# Patient Record
Sex: Female | Born: 1980 | Race: White | Hispanic: No | Marital: Married | Smoking: Former smoker
Health system: Southern US, Community
[De-identification: ages and names within clinical notes are randomized; demographics above are authoritative.]

## PROBLEM LIST (undated history)

## (undated) ENCOUNTER — Inpatient Hospital Stay (HOSPITAL_COMMUNITY): Payer: Self-pay

## (undated) DIAGNOSIS — Z5189 Encounter for other specified aftercare: Secondary | ICD-10-CM

## (undated) DIAGNOSIS — N760 Acute vaginitis: Secondary | ICD-10-CM

## (undated) DIAGNOSIS — R519 Headache, unspecified: Secondary | ICD-10-CM

## (undated) DIAGNOSIS — F419 Anxiety disorder, unspecified: Secondary | ICD-10-CM

## (undated) DIAGNOSIS — B9689 Other specified bacterial agents as the cause of diseases classified elsewhere: Secondary | ICD-10-CM

## (undated) DIAGNOSIS — G709 Myoneural disorder, unspecified: Secondary | ICD-10-CM

## (undated) DIAGNOSIS — C189 Malignant neoplasm of colon, unspecified: Secondary | ICD-10-CM

## (undated) DIAGNOSIS — Z933 Colostomy status: Secondary | ICD-10-CM

## (undated) DIAGNOSIS — Z9221 Personal history of antineoplastic chemotherapy: Secondary | ICD-10-CM

## (undated) DIAGNOSIS — C801 Malignant (primary) neoplasm, unspecified: Secondary | ICD-10-CM

## (undated) DIAGNOSIS — K56609 Unspecified intestinal obstruction, unspecified as to partial versus complete obstruction: Secondary | ICD-10-CM

## (undated) DIAGNOSIS — Z8052 Family history of malignant neoplasm of bladder: Secondary | ICD-10-CM

## (undated) DIAGNOSIS — K635 Polyp of colon: Secondary | ICD-10-CM

## (undated) DIAGNOSIS — N390 Urinary tract infection, site not specified: Secondary | ICD-10-CM

## (undated) HISTORY — PX: COLONOSCOPY: SHX174

## (undated) HISTORY — DX: Encounter for other specified aftercare: Z51.89

## (undated) HISTORY — DX: Malignant neoplasm of colon, unspecified: C18.9

## (undated) HISTORY — DX: Family history of malignant neoplasm of bladder: Z80.52

## (undated) HISTORY — DX: Unspecified intestinal obstruction, unspecified as to partial versus complete obstruction: K56.609

## (undated) HISTORY — DX: Anxiety disorder, unspecified: F41.9

## (undated) HISTORY — DX: Polyp of colon: K63.5

## (undated) HISTORY — DX: Myoneural disorder, unspecified: G70.9

## (undated) HISTORY — DX: Personal history of antineoplastic chemotherapy: Z92.21

## (undated) HISTORY — DX: Colostomy status: Z93.3

---

## 1999-01-14 ENCOUNTER — Emergency Department (HOSPITAL_COMMUNITY): Admission: EM | Admit: 1999-01-14 | Discharge: 1999-01-14 | Payer: Self-pay | Admitting: Emergency Medicine

## 1999-01-17 ENCOUNTER — Inpatient Hospital Stay (HOSPITAL_COMMUNITY): Admission: EM | Admit: 1999-01-17 | Discharge: 1999-01-19 | Payer: Self-pay | Admitting: *Deleted

## 2000-01-07 ENCOUNTER — Emergency Department (HOSPITAL_COMMUNITY): Admission: EM | Admit: 2000-01-07 | Discharge: 2000-01-07 | Payer: Self-pay | Admitting: Emergency Medicine

## 2000-01-09 ENCOUNTER — Emergency Department (HOSPITAL_COMMUNITY): Admission: EM | Admit: 2000-01-09 | Discharge: 2000-01-09 | Payer: Self-pay | Admitting: Emergency Medicine

## 2000-05-13 ENCOUNTER — Emergency Department (HOSPITAL_COMMUNITY): Admission: EM | Admit: 2000-05-13 | Discharge: 2000-05-13 | Payer: Self-pay | Admitting: Emergency Medicine

## 2000-12-14 ENCOUNTER — Inpatient Hospital Stay (HOSPITAL_COMMUNITY): Admission: AD | Admit: 2000-12-14 | Discharge: 2000-12-14 | Payer: Self-pay | Admitting: *Deleted

## 2000-12-19 ENCOUNTER — Emergency Department (HOSPITAL_COMMUNITY): Admission: EM | Admit: 2000-12-19 | Discharge: 2000-12-19 | Payer: Self-pay | Admitting: Emergency Medicine

## 2001-01-26 ENCOUNTER — Emergency Department (HOSPITAL_COMMUNITY): Admission: EM | Admit: 2001-01-26 | Discharge: 2001-01-26 | Payer: Self-pay | Admitting: Emergency Medicine

## 2001-02-06 ENCOUNTER — Emergency Department (HOSPITAL_COMMUNITY): Admission: EM | Admit: 2001-02-06 | Discharge: 2001-02-07 | Payer: Self-pay | Admitting: Emergency Medicine

## 2001-07-22 ENCOUNTER — Emergency Department (HOSPITAL_COMMUNITY): Admission: EM | Admit: 2001-07-22 | Discharge: 2001-07-22 | Payer: Self-pay | Admitting: Emergency Medicine

## 2001-10-04 ENCOUNTER — Emergency Department (HOSPITAL_COMMUNITY): Admission: EM | Admit: 2001-10-04 | Discharge: 2001-10-04 | Payer: Self-pay | Admitting: Emergency Medicine

## 2002-02-07 ENCOUNTER — Emergency Department (HOSPITAL_COMMUNITY): Admission: EM | Admit: 2002-02-07 | Discharge: 2002-02-07 | Payer: Self-pay | Admitting: Emergency Medicine

## 2002-03-14 ENCOUNTER — Inpatient Hospital Stay (HOSPITAL_COMMUNITY): Admission: EM | Admit: 2002-03-14 | Discharge: 2002-03-17 | Payer: Self-pay | Admitting: Psychiatry

## 2004-01-21 ENCOUNTER — Emergency Department (HOSPITAL_COMMUNITY): Admission: EM | Admit: 2004-01-21 | Discharge: 2004-01-21 | Payer: Self-pay | Admitting: Emergency Medicine

## 2004-07-23 ENCOUNTER — Emergency Department (HOSPITAL_COMMUNITY): Admission: EM | Admit: 2004-07-23 | Discharge: 2004-07-24 | Payer: Self-pay | Admitting: *Deleted

## 2005-02-18 ENCOUNTER — Emergency Department (HOSPITAL_COMMUNITY): Admission: EM | Admit: 2005-02-18 | Discharge: 2005-02-18 | Payer: Self-pay | Admitting: Emergency Medicine

## 2005-05-28 ENCOUNTER — Emergency Department (HOSPITAL_COMMUNITY): Admission: EM | Admit: 2005-05-28 | Discharge: 2005-05-28 | Payer: Self-pay | Admitting: Emergency Medicine

## 2005-06-09 ENCOUNTER — Emergency Department (HOSPITAL_COMMUNITY): Admission: EM | Admit: 2005-06-09 | Discharge: 2005-06-10 | Payer: Self-pay | Admitting: Emergency Medicine

## 2006-04-06 ENCOUNTER — Emergency Department (HOSPITAL_COMMUNITY): Admission: EM | Admit: 2006-04-06 | Discharge: 2006-04-06 | Payer: Self-pay | Admitting: *Deleted

## 2006-05-04 ENCOUNTER — Emergency Department (HOSPITAL_COMMUNITY): Admission: EM | Admit: 2006-05-04 | Discharge: 2006-05-04 | Payer: Self-pay | Admitting: Emergency Medicine
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
## 2006-07-27 ENCOUNTER — Emergency Department (HOSPITAL_COMMUNITY): Admission: EM | Admit: 2006-07-27 | Discharge: 2006-07-27 | Payer: Self-pay | Admitting: Emergency Medicine

## 2006-12-15 ENCOUNTER — Emergency Department (HOSPITAL_COMMUNITY): Admission: EM | Admit: 2006-12-15 | Discharge: 2006-12-15 | Payer: Self-pay | Admitting: Emergency Medicine

## 2007-06-28 ENCOUNTER — Emergency Department (HOSPITAL_COMMUNITY): Admission: EM | Admit: 2007-06-28 | Discharge: 2007-06-28 | Payer: Self-pay | Admitting: Emergency Medicine

## 2007-08-13 ENCOUNTER — Emergency Department (HOSPITAL_COMMUNITY): Admission: EM | Admit: 2007-08-13 | Discharge: 2007-08-13 | Payer: Self-pay | Admitting: Emergency Medicine

## 2007-10-04 ENCOUNTER — Emergency Department (HOSPITAL_COMMUNITY): Admission: EM | Admit: 2007-10-04 | Discharge: 2007-10-04 | Payer: Self-pay | Admitting: Emergency Medicine
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
## 2008-03-17 ENCOUNTER — Emergency Department (HOSPITAL_COMMUNITY): Admission: EM | Admit: 2008-03-17 | Discharge: 2008-03-18 | Payer: Self-pay | Admitting: Emergency Medicine

## 2008-05-09 ENCOUNTER — Emergency Department (HOSPITAL_COMMUNITY): Admission: EM | Admit: 2008-05-09 | Discharge: 2008-05-09 | Payer: Self-pay | Admitting: Internal Medicine

## 2008-05-09 IMAGING — US US OB LIMITED
1 series · 14 of 28 positions shown · non-contrast
Comparison: none

CLINICAL DATA: Abdominal pain and cramping

[Series 1: unknown · 14 of 42 slices shown]
[im 2/42]
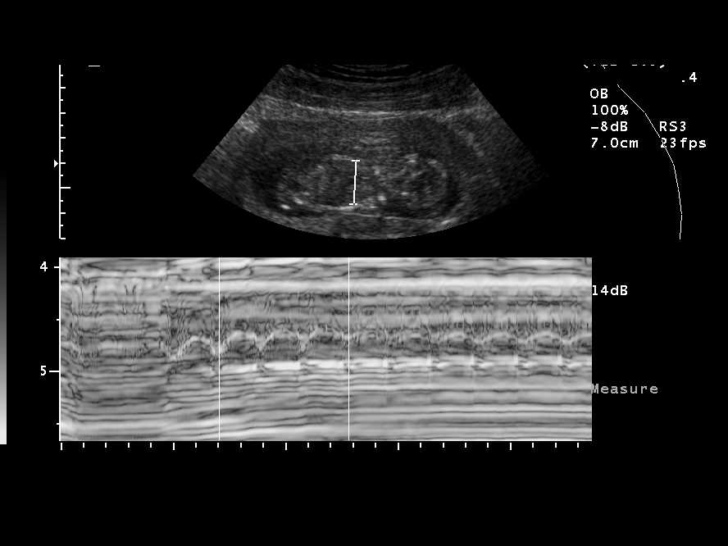
[im 5/42]
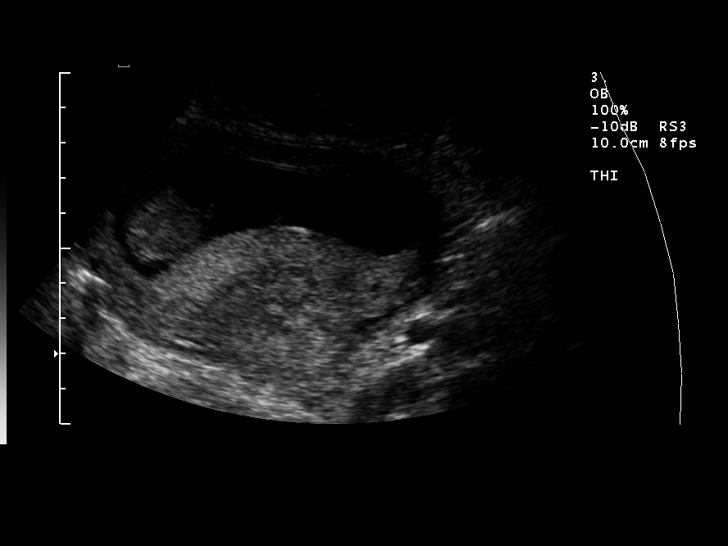
[im 8/42]
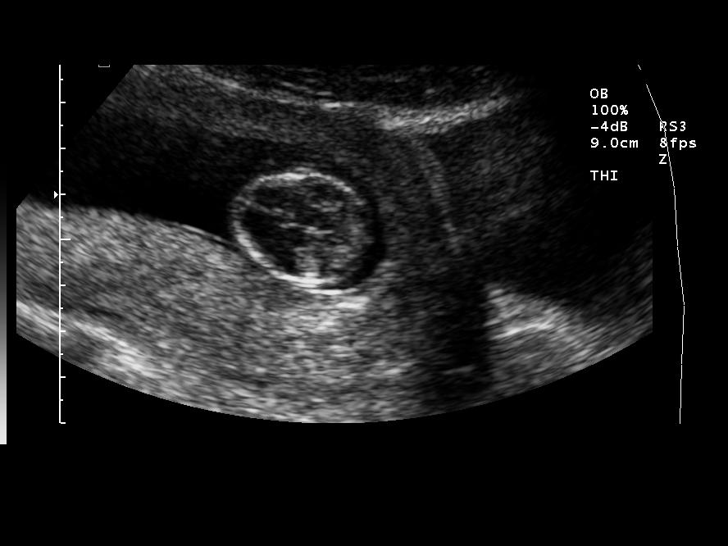
[im 11/42]
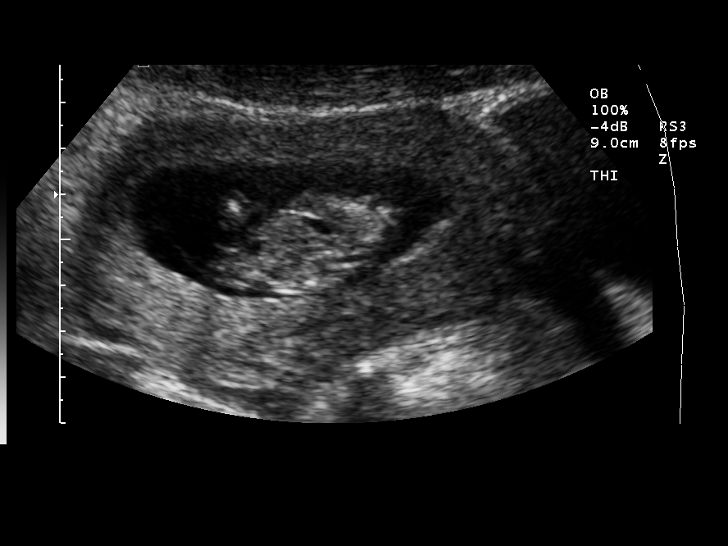
[im 14/42]
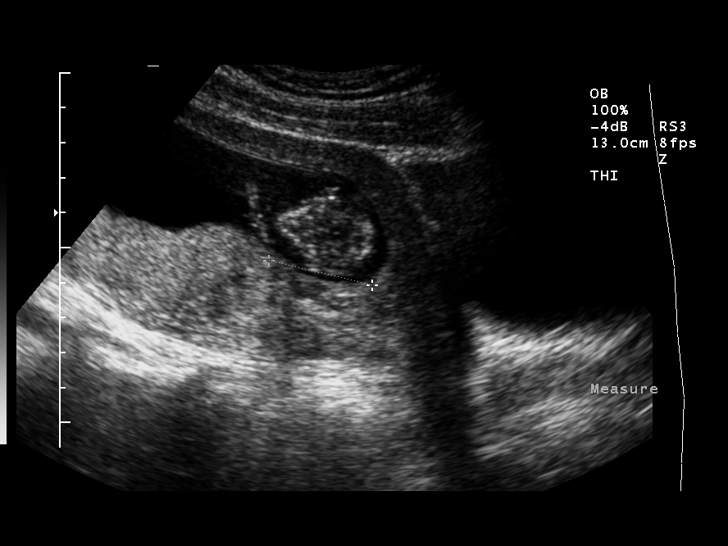
[im 17/42]
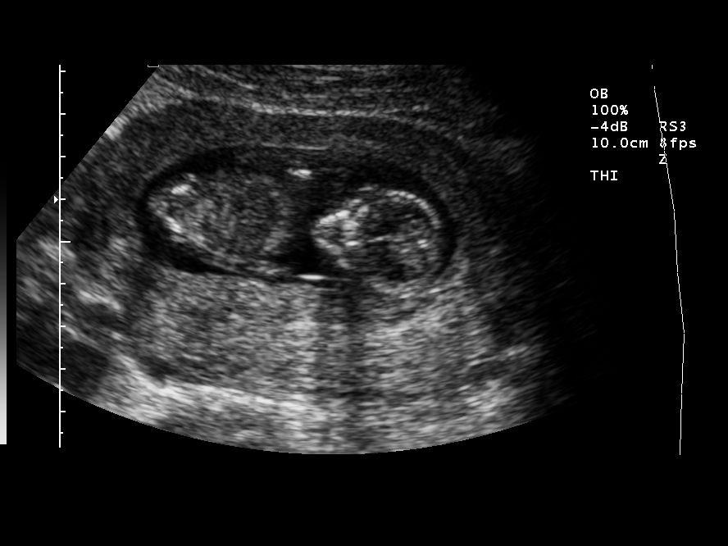
[im 20/42]
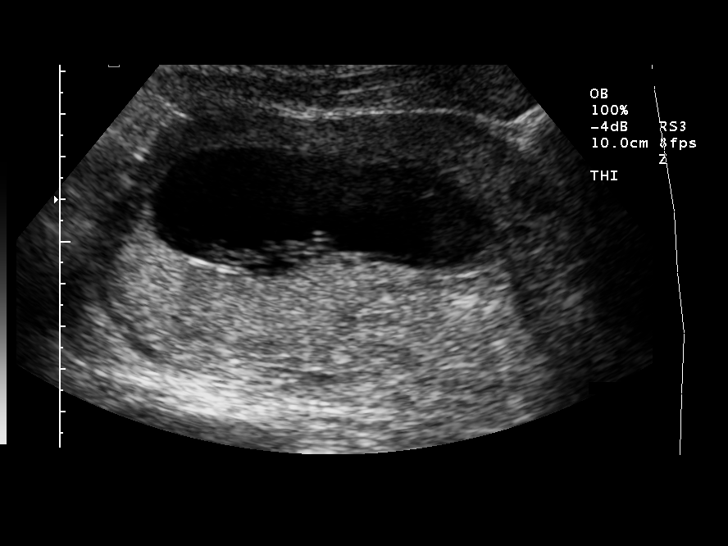
[im 23/42]
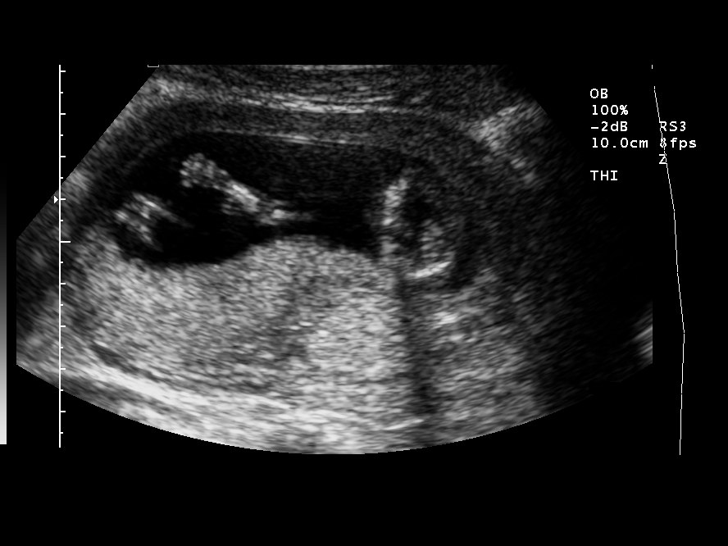
[im 26/42]
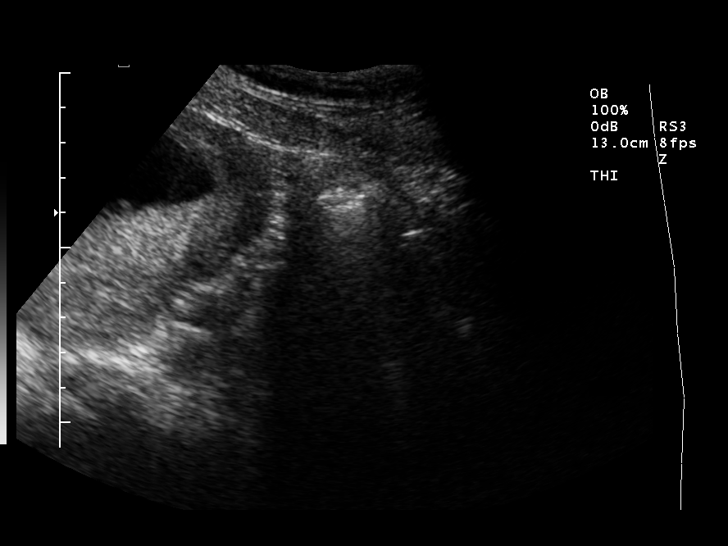
[im 29/42]
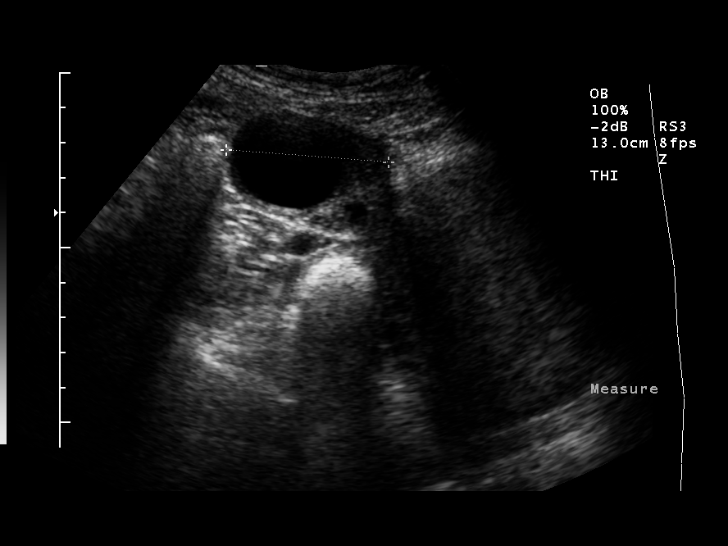
[im 32/42]
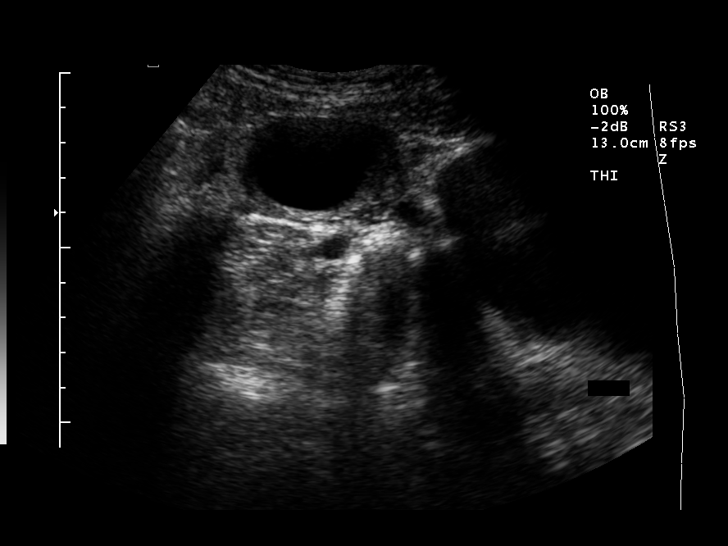
[im 35/42]
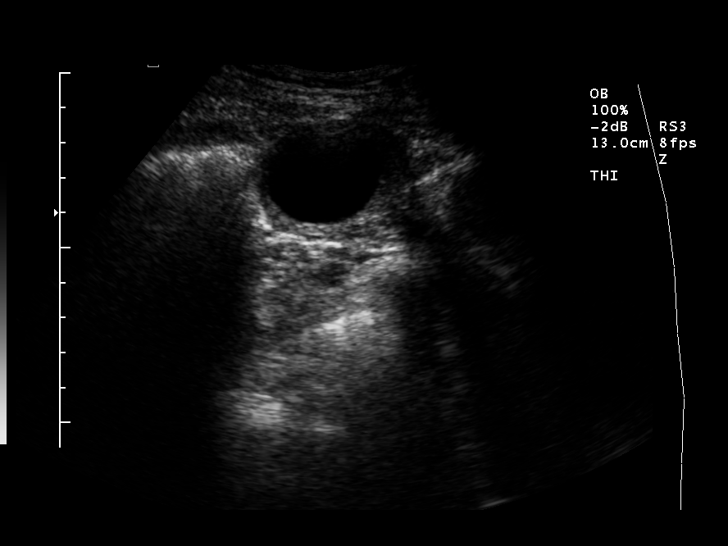
[im 38/42]
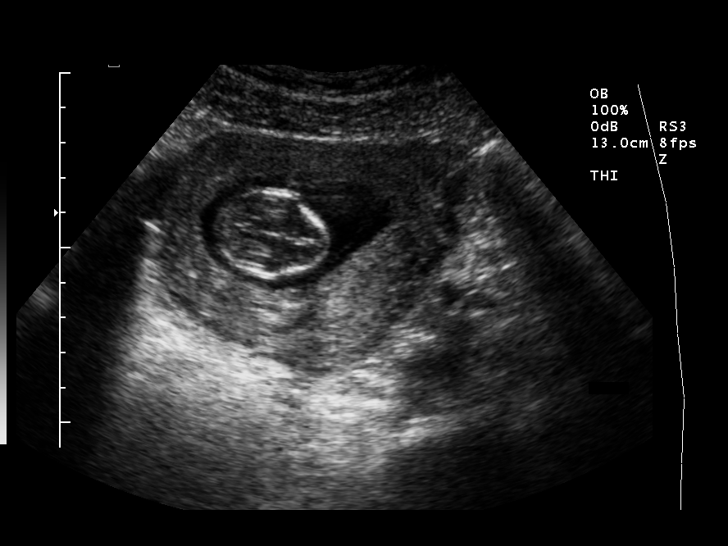
[im 42/42]
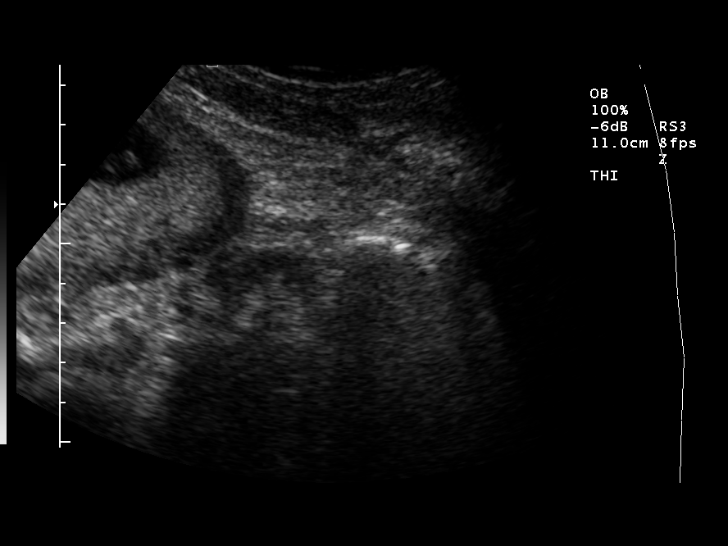

[14 of 28 positions shown; findings below may reference images not displayed]

LIMITED OBSTETRIC ULTRASOUND

Number of Fetuses: 1
Heart Rate: [M6]
Movement: yes
Breathing: yes
Presentation: Cephalic

Placental Location: Posterior
Grade: Not assessed
Previa: No
Amniotic Fluid (Subjective): Normal
Amniotic Fluid (Objective):  Vertical Pocket not assessed cm
                          AFI not assessed cm  (5 %ile = cm; 95
%ile = cm for wks)

The following fetal anatomy was visualized on today's exam: Not
evaluated on this limited study.

Gestational age is estimated at 13 weeks 6 days by BPD

MATERNAL FINDINGS:
Cervix: Closed

3.5 cm right ovarian cyst noted.
IMPRESSION: 1.  Single intrauterine gestation estimated at 13-week 6 days.
2.  3.5 cm right ovarian cyst noted.
3.  No other acute or significant findings.

## 2008-06-26 ENCOUNTER — Inpatient Hospital Stay (HOSPITAL_COMMUNITY): Admission: AD | Admit: 2008-06-26 | Discharge: 2008-06-26 | Payer: Self-pay | Admitting: Obstetrics and Gynecology

## 2008-10-03 ENCOUNTER — Inpatient Hospital Stay (HOSPITAL_COMMUNITY): Admission: AD | Admit: 2008-10-03 | Discharge: 2008-10-03 | Payer: Self-pay | Admitting: *Deleted

## 2008-10-04 ENCOUNTER — Inpatient Hospital Stay (HOSPITAL_COMMUNITY): Admission: AD | Admit: 2008-10-04 | Discharge: 2008-10-04 | Payer: Self-pay | Admitting: Obstetrics and Gynecology

## 2008-10-05 ENCOUNTER — Inpatient Hospital Stay (HOSPITAL_COMMUNITY): Admission: AD | Admit: 2008-10-05 | Discharge: 2008-10-06 | Payer: Self-pay | Admitting: Obstetrics and Gynecology

## 2008-10-28 ENCOUNTER — Inpatient Hospital Stay (HOSPITAL_COMMUNITY): Admission: AD | Admit: 2008-10-28 | Discharge: 2008-10-28 | Payer: Self-pay | Admitting: Obstetrics and Gynecology

## 2008-10-29 ENCOUNTER — Inpatient Hospital Stay (HOSPITAL_COMMUNITY): Admission: AD | Admit: 2008-10-29 | Discharge: 2008-10-29 | Payer: Self-pay | Admitting: Obstetrics

## 2008-11-11 ENCOUNTER — Inpatient Hospital Stay (HOSPITAL_COMMUNITY): Admission: AD | Admit: 2008-11-11 | Discharge: 2008-11-15 | Payer: Self-pay | Admitting: Obstetrics

## 2008-11-12 ENCOUNTER — Encounter (INDEPENDENT_AMBULATORY_CARE_PROVIDER_SITE_OTHER): Payer: Self-pay | Admitting: Obstetrics

## 2008-12-26 ENCOUNTER — Emergency Department (HOSPITAL_COMMUNITY): Admission: EM | Admit: 2008-12-26 | Discharge: 2008-12-27 | Payer: Self-pay | Admitting: Emergency Medicine
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
## 2010-02-27 DIAGNOSIS — F988 Other specified behavioral and emotional disorders with onset usually occurring in childhood and adolescence: Secondary | ICD-10-CM | POA: Insufficient documentation

## 2010-02-27 DIAGNOSIS — F411 Generalized anxiety disorder: Secondary | ICD-10-CM | POA: Insufficient documentation

## 2010-05-10 ENCOUNTER — Emergency Department (HOSPITAL_COMMUNITY): Admission: EM | Admit: 2010-05-10 | Discharge: 2010-05-11 | Payer: Self-pay | Admitting: Emergency Medicine
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
## 2010-11-16 ENCOUNTER — Emergency Department (HOSPITAL_COMMUNITY)
Admission: EM | Admit: 2010-11-16 | Discharge: 2010-11-16 | Disposition: A | Discharge status: Home / Self Care | Payer: Self-pay | Attending: Emergency Medicine | Admitting: Emergency Medicine

## 2010-11-16 DIAGNOSIS — F988 Other specified behavioral and emotional disorders with onset usually occurring in childhood and adolescence: Secondary | ICD-10-CM | POA: Insufficient documentation

## 2010-11-16 DIAGNOSIS — N760 Acute vaginitis: Secondary | ICD-10-CM | POA: Insufficient documentation

## 2010-11-16 DIAGNOSIS — R109 Unspecified abdominal pain: Secondary | ICD-10-CM | POA: Insufficient documentation

## 2010-11-16 LAB — URINALYSIS, ROUTINE W REFLEX MICROSCOPIC
Hgb urine dipstick: NEGATIVE
Ketones, ur: 15 mg/dL — AB
Nitrite: NEGATIVE
Protein, ur: NEGATIVE mg/dL
Urine Glucose, Fasting: NEGATIVE mg/dL

## 2010-11-16 LAB — WET PREP, GENITAL
Trich, Wet Prep: NONE SEEN
Yeast Wet Prep HPF POC: NONE SEEN

## 2010-11-18 LAB — GC/CHLAMYDIA PROBE AMP, GENITAL: GC Probe Amp, Genital: NEGATIVE

## 2011-01-14 LAB — DIFFERENTIAL
Eosinophils Absolute: 0.1 10*3/uL (ref 0.0–0.7)
Eosinophils Relative: 1 % (ref 0–5)
Lymphs Abs: 0.9 10*3/uL (ref 0.7–4.0)
Monocytes Absolute: 0.6 10*3/uL (ref 0.1–1.0)
Monocytes Relative: 6 % (ref 3–12)
Neutro Abs: 8.8 10*3/uL — ABNORMAL HIGH (ref 1.7–7.7)

## 2011-01-14 LAB — CBC
HCT: 31.6 % — ABNORMAL LOW (ref 36.0–46.0)
HCT: 38 % (ref 36.0–46.0)
HCT: 29.3 % — ABNORMAL LOW (ref 36.0–46.0)
Hemoglobin: 10.7 g/dL — ABNORMAL LOW (ref 12.0–15.0)
Hemoglobin: 12.9 g/dL (ref 12.0–15.0)
Hemoglobin: 10.1 g/dL — ABNORMAL LOW (ref 12.0–15.0)
MCHC: 33.7 g/dL (ref 30.0–36.0)
MCHC: 34 g/dL (ref 30.0–36.0)
MCHC: 34.6 g/dL (ref 30.0–36.0)
MCV: 91.1 fL (ref 78.0–100.0)
MCV: 91.1 fL (ref 78.0–100.0)
RBC: 4.17 MIL/uL (ref 3.87–5.11)
RBC: 3.22 MIL/uL — ABNORMAL LOW (ref 3.87–5.11)
RDW: 14.1 % (ref 11.5–15.5)
RDW: 14.2 % (ref 11.5–15.5)
WBC: 9.3 10*3/uL (ref 4.0–10.5)

## 2011-01-14 LAB — COMPREHENSIVE METABOLIC PANEL
Albumin: 1.9 g/dL — ABNORMAL LOW (ref 3.5–5.2)
CO2: 25 mEq/L (ref 19–32)
Calcium: 8 mg/dL — ABNORMAL LOW (ref 8.4–10.5)
GFR calc non Af Amer: >60 mL/min (ref >60)
Potassium: 3.7 mEq/L (ref 3.5–5.1)
Total Protein: 4.4 g/dL — ABNORMAL LOW (ref 6.0–8.3)

## 2011-01-14 LAB — LACTATE DEHYDROGENASE: LDH: 182 U/L (ref 94–250)

## 2011-01-14 LAB — PLATELET COUNT: Platelets: 115 10*3/uL — ABNORMAL LOW (ref 150–400)

## 2011-02-11 NOTE — Op Note (Signed)
Stephanie [name], Stephanie [name]           ACCOUNT NO.:  [id number]   MEDICAL RECORD NO.:  [id number]          PATIENT TYPE:  INP   LOCATION:  9146                          FACILITY:  [facility]   PHYSICIAN:  Maxie Better, M.D.DATE OF BIRTH:  06/26/1981   DATE OF PROCEDURE:  11/12/2008  DATE OF DISCHARGE:                               OPERATIVE REPORT   PREOPERATIVE DIAGNOSES:  1. Arrest of descent.  2. Presumed chorioamnionitis.  3. Post term.   PROCEDURE:  Primary cesarean section, Kerr hysterotomy.   POSTOPERATIVE DIAGNOSES:  1. Arrest of descent.  2. Presumed chorioamnionitis.  3. Post term.   ANESTHESIA:  Epidural.   SURGEON:  Maxie Better, MD   ASSISTANT:  Marlinda Mike, CNM   INDICATIONS:  A 30 year old gravida 1, para 0 female postdate admitted  on November 11, 2008, for induction of labor.  She underwent Cytotec  placement x2 with subsequent use of low-dose Pitocin.  Artifical rupture  of membranes was performed.  Clear fluid noted.  The patient progressed  to full dilatation.  She developed a temperature during her labor of  100.5 axillary.  At that time, the patient was fully dilated.  She was  given Unasyn, Tylenol, and started to push.  The patient pushed for 3  hours without further descent beyond +1 station and requested a cesarean  section.  Consent was signed, procedure explained.  Surgical risks were  reviewed.  She was transferred to the operating room.   PROCEDURE:  Under adequate epidural anesthesia, the patient was placed  in a supine position with a left lateral tilt.  An indwelling Foley  catheter was already in place.  The patient was sterilely prepped and  draped in usual fashion.  Marcaine 0.25% was injected along the planned  Pfannenstiel skin incision site.  Pfannenstiel skin incision was then  made, carried down to the rectus fascia.  Rectus fascia was opened  transversely.  The rectus fascia was then bluntly and sharply dissected  off the  rectus muscle in superior and inferior fashion.  Rectus muscles  split in midline.  The parietal peritoneum was entered sharply and  extended.  The vesicouterine peritoneum was opened transversely.  The  bladder was then bluntly dissected off the lower uterine segment,  displaced inferiorly.  A curvilinear low-transverse uterine incision was  then made and extended with bandage scissors.  Initially attempted to  delivery of left occiput posterior presentation, which was unsuccessful.  The vertex was wedged in the pelvis.  Staff was utilized to ultimately  push the head from the vaginal root with subsequent delivery of large  female who was bulb suctioned.  The abdominal cord was clamped and cut.  The baby was transferred to the awaiting pediatrician who assigned  Apgars of 8 and 9 at 1 and 5 minutes.  The placenta was manually  removed.  Uterine cavity was cleaned of debris.  The uterine incision  had no extension.  It was closed in 2 layers; the first layer with 0  Monocryl running locked stitch and second layer imbricated using 0  Monocryl suture.  Normal tubes and ovaries were noted  bilaterally.  Some  very tiny subserosal fibroids noted.  Abdomen was copiously irrigated,  suctioned debris.  Good hemostasis was noted along the incision line.  The parietal peritoneum was then closed with 2-0 Vicryl.  The rectus  fascia was closed with 0 Vicryl x2.  The subcutaneous area was  irrigated, small bleeders cauterized, interrupted 2-0 plain sutures  placed, and skin approximated using Ethicon staples.   SPECIMENS:  Placenta sent to Pathology for maternal fever.   ESTIMATED BLOOD LOSS:  800 mL.   URINE OUTPUT:  650 mL.   INTRAOPERATIVE FLUID:  600 mL.   COUNTS:  Sponge and instrument counts x2 correct.   COMPLICATIONS:  None.   Weight of the baby was 8 pounds 9 ounces.  The patient tolerated the  procedure well and was transferred to recovery in stable condition.      Maxie Better, M.D.  Electronically Signed     /MEDQ  D:  11/12/2008  T:  11/13/2008  Job:  [id number]

## 2011-02-11 NOTE — H&P (Signed)
NAMELAVETTA, [name]           ACCOUNT NO.:  [id number]   MEDICAL RECORD NO.:  [id number]          PATIENT TYPE:  MAT   LOCATION:  [facility]C                          FACILITY:  [facility]   PHYSICIAN:  Lenoard Aden, M.D.DATE OF BIRTH:  1980/10/22   DATE OF ADMISSION:  10/05/2008  DATE OF DISCHARGE:  10/06/2008                              HISTORY & PHYSICAL   CHIEF COMPLAINT:  Contraction.   She is a 30 year old white female G3, P0 at 34-6/7th weeks gestation  with a history of preterm contractions and minor preterm cervical  change.  She denies contractions or bleeding.  She has some questionable  leakage of fluid.   ALLERGIES:  She has no know drug allergies.   MEDICATIONS:  Prenatal vitamins and Valtrex.   She had a personal history of migraine headache, pyelonephritis, and  HSV.  Currently, with no recent outbreaks.  She has a pregnancy history  remarkable for 1 uncomplicated miscarriage and 1 uncomplicated abortion.   FAMILY HISTORY:  Stroke, myocardial infarction, and migraine headaches.   PHYSICAL EXAMINATION:  GENERAL:  Well-developed, well-nourished white  female in no acute distress.  HEENT:  Normal.  LUNGS:  Clear.  HEART:  Regular rate and rhythm.  ABDOMEN:  Soft, gravid, and nontender.  Cervix is 1, 50% vertex, and -1.  EXTREMITIES:  There are no cords.  NEUROLOGIC:  Nonfocal.  SKIN:  Intact.   NST is reactive.  Irregular contractions are noted,  ferning and  Nitrazine  pending.   IMPRESSION:  1. A 34-week OB.  2. Preterm contractions.  No other cervical change.  3. Questionable leakage of fluid with no obvious evidence on exam.   PLAN:  Check  ferning and Nitrazine  and monitor.  Tocolytics as needed.  Anticipate discharge in the morning.      Lenoard Aden, M.D.  Electronically Signed    RJT/MEDQ  D:  10/05/2008  T:  10/06/2008  Job:  [id number]

## 2011-02-14 NOTE — Discharge Summary (Signed)
NAMESANOE, Stephanie [name]           ACCOUNT NO.:  [id number]   MEDICAL RECORD NO.:  [id number]          PATIENT TYPE:  INP   LOCATION:  9146                          [facility]   PHYSICIAN:  Maxie Better, M.D.DATE OF BIRTH:  08-23-1981   DATE OF ADMISSION:  11/11/2008  DATE OF DISCHARGE:  11/15/2008                               DISCHARGE SUMMARY   ADMISSION DIAGNOSIS:  Postdates.   DISCHARGE DIAGNOSES:  1. Postdates, delivered.  2. Arrest of descent.  3. Acute chorioamnionitis.  4. Thrombocytopenia   PROCEDURE:  Primary cesarean section.   HISTORY OF PRESENT ILLNESS:  A 30 year old gravida 3, para 0-0-2-0  female at 40-1/7th weeks admitted for induction of labor.  Prenatal  course had been uncomplicated.  The patient's history is notable for HSV  for which the patient had not had any outbreak and was started on  Valtrex suppressive therapy.   HOSPITAL COURSE:  The patient was admitted.  She underwent Cytotec for  cervical ripening.  Her cervix was 2, 60, -2.  Cytotec was placed x2.  The patient subsequently had Pitocin started.  Artificial rupture of  membranes, clear fluid, and had some variable deceleration requiring  amnioinfusion.  The patient became fully +2 station by the RN; however,  on re-exam, she was +1 station.  We recorded temperature to 100.5  axillary.  The patient had been ruptured less than 24 hours and group B  strep was negative.  However, she was given IV fluids.  Unasyn and  Tylenol was started to push.  The patient did not have any further  descent.  She requested a cesarean section.  Primary C-section was  performed.  Procedure resulted in delivery of a wedged hyperextended  live female from a left occiput posterior presentation 8 pounds 9  ounces, Apgars of 8 and 9.  Normal tubes and ovaries were noted.  The  patient was continued on her antibiotics postoperatively.  I think, she  was afebrile for 24 hours.  Her CBC on postop day #1 showed a  hemoglobin  of 10.9, hematocrit of 32, platelet count of 108,000, white count of  10.1.  The patient continued to do well.  By postop day #3, she was  requesting to go home.  A repeat platelet count was 117,000.  Her  incision had no evidence of infection.  She was deemed well to be  discharged home.   DISPOSITION:  Home.   CONDITION:  Stable.   DISCHARGE MEDICATIONS:  1. Motrin 600-800 mg every 8 hours p.r.n. pain.  2. Percocet 1-2 tablets every 6-8 hours p.r.n. pain.  3. Prenatal vitamins one p.o. daily.   FOLLOWUP APPOINTMENT:  At [facility] in 6 weeks.  Discharge  instructions per the postpartum booklet given.  Final pathology was  consistent with acute chorioamnionitis.      Maxie Better, M.D.  Electronically Signed     [facility]/MEDQ  D:  12/10/2008  T:  12/11/2008  Job:  [id number]

## 2011-06-26 LAB — URINE MICROSCOPIC-ADD ON

## 2011-06-26 LAB — URINALYSIS, ROUTINE W REFLEX MICROSCOPIC
Bilirubin Urine: NEGATIVE
Hgb urine dipstick: NEGATIVE
Specific Gravity, Urine: 1.013
pH: 6

## 2011-06-26 LAB — WET PREP, GENITAL
Clue Cells Wet Prep HPF POC: NONE SEEN
Trich, Wet Prep: NONE SEEN
Yeast Wet Prep HPF POC: NONE SEEN

## 2011-06-26 LAB — GC/CHLAMYDIA PROBE AMP, GENITAL
Chlamydia, DNA Probe: NEGATIVE
GC Probe Amp, Genital: NEGATIVE

## 2011-06-26 LAB — HCG, QUANTITATIVE, PREGNANCY: hCG, Beta Chain, Quant, S: 21567 — ABNORMAL HIGH

## 2011-06-26 LAB — RAPID STREP SCREEN (MED CTR MEBANE ONLY): Streptococcus, Group A Screen (Direct): NEGATIVE

## 2011-06-27 LAB — URINALYSIS, ROUTINE W REFLEX MICROSCOPIC
Glucose, UA: NEGATIVE
Hgb urine dipstick: NEGATIVE
Ketones, ur: NEGATIVE
Protein, ur: NEGATIVE
Urobilinogen, UA: 0.2

## 2011-06-27 LAB — GC/CHLAMYDIA PROBE AMP, GENITAL: GC Probe Amp, Genital: NEGATIVE

## 2011-06-27 LAB — WET PREP, GENITAL
Trich, Wet Prep: NONE SEEN
Yeast Wet Prep HPF POC: NONE SEEN

## 2011-07-10 LAB — URINALYSIS, ROUTINE W REFLEX MICROSCOPIC
Bilirubin Urine: NEGATIVE
Glucose, UA: NEGATIVE
Hgb urine dipstick: NEGATIVE
Specific Gravity, Urine: 1.022

## 2011-07-10 LAB — URINE CULTURE

## 2011-07-10 LAB — URINE MICROSCOPIC-ADD ON

## 2012-02-09 DIAGNOSIS — A6000 Herpesviral infection of urogenital system, unspecified: Secondary | ICD-10-CM | POA: Insufficient documentation

## 2012-03-23 DIAGNOSIS — Z975 Presence of (intrauterine) contraceptive device: Secondary | ICD-10-CM | POA: Insufficient documentation

## 2012-06-01 DIAGNOSIS — N390 Urinary tract infection, site not specified: Secondary | ICD-10-CM | POA: Insufficient documentation

## 2012-10-19 ENCOUNTER — Encounter (HOSPITAL_COMMUNITY): Payer: Self-pay

## 2012-10-19 ENCOUNTER — Emergency Department (HOSPITAL_COMMUNITY): Payer: Self-pay

## 2012-10-19 ENCOUNTER — Emergency Department (HOSPITAL_COMMUNITY)
Admission: EM | Admit: 2012-10-19 | Discharge: 2012-10-19 | Disposition: A | Discharge status: Home / Self Care | Payer: Self-pay | Attending: Emergency Medicine | Admitting: Emergency Medicine

## 2012-10-19 DIAGNOSIS — R059 Cough, unspecified: Secondary | ICD-10-CM | POA: Insufficient documentation

## 2012-10-19 DIAGNOSIS — Z3202 Encounter for pregnancy test, result negative: Secondary | ICD-10-CM | POA: Insufficient documentation

## 2012-10-19 DIAGNOSIS — Y939 Activity, unspecified: Secondary | ICD-10-CM | POA: Insufficient documentation

## 2012-10-19 DIAGNOSIS — R05 Cough: Secondary | ICD-10-CM | POA: Insufficient documentation

## 2012-10-19 DIAGNOSIS — X58XXXA Exposure to other specified factors, initial encounter: Secondary | ICD-10-CM | POA: Insufficient documentation

## 2012-10-19 DIAGNOSIS — T148XXA Other injury of unspecified body region, initial encounter: Secondary | ICD-10-CM | POA: Insufficient documentation

## 2012-10-19 DIAGNOSIS — R110 Nausea: Secondary | ICD-10-CM | POA: Insufficient documentation

## 2012-10-19 DIAGNOSIS — Y929 Unspecified place or not applicable: Secondary | ICD-10-CM | POA: Insufficient documentation

## 2012-10-19 DIAGNOSIS — F17200 Nicotine dependence, unspecified, uncomplicated: Secondary | ICD-10-CM | POA: Insufficient documentation

## 2012-10-19 DIAGNOSIS — J069 Acute upper respiratory infection, unspecified: Secondary | ICD-10-CM | POA: Insufficient documentation

## 2012-10-19 LAB — POCT PREGNANCY, URINE: Preg Test, Ur: NEGATIVE

## 2012-10-19 LAB — URINALYSIS, ROUTINE W REFLEX MICROSCOPIC
Ketones, ur: NEGATIVE mg/dL
Leukocytes, UA: NEGATIVE
Nitrite: NEGATIVE
Protein, ur: NEGATIVE mg/dL
Urobilinogen, UA: 0.2 mg/dL (ref 0.0–1.0)

## 2012-10-19 LAB — URINE MICROSCOPIC-ADD ON

## 2012-10-19 MED ORDER — IBUPROFEN 800 MG PO TABS
800.0000 mg | ORAL_TABLET | Freq: Once | ORAL | Status: AC
  Administered 2012-10-19: 800 mg via ORAL
  Filled 2012-10-19: qty 1

## 2012-10-19 NOTE — ED Provider Notes (Addendum)
History     CSN: [id number]  Arrival date & time 10/19/12  1337   First MD Initiated Contact with Patient 10/19/12 1741      Chief Complaint  Patient presents with  . Back Pain  . Cough  . Nausea    (Consider location/radiation/quality/duration/timing/severity/associated sxs/prior treatment) Patient is a 32 y.o. female presenting with back pain and cough. The history is provided by the patient.  Back Pain   Cough  pt here with productive cough and congestion x 3 days--some dysuria without hematuria--no emesis or diarrhea--no ear pain or sore throat--no rashes--positive sick exposures--used otc meds without relief--also notes sharp right flank pain worse with movement--denies vaginal bleeding or discharge  History reviewed. No pertinent past medical history.  Past Surgical History  Procedure Date  . Cesarean section     No family history on file.  History  Substance Use Topics  . Smoking status: Current Some Day Smoker    Types: Cigarettes  . Smokeless tobacco: Never Used  . Alcohol Use: Yes     Comment: seldom    OB History    Grav Para Term Preterm Abortions TAB SAB Ect Mult Living                  Review of Systems  Respiratory: Positive for cough.   Musculoskeletal: Positive for back pain.  All other systems reviewed and are negative.    Allergies  Review of patient's allergies indicates no known allergies.  Home Medications   Current Outpatient Rx  Name  Route  Sig  Dispense  Refill  . PSEUDOEPHEDRINE-APAP-DM 60-650-20 MG/30ML PO LIQD   Oral   Take 30 mLs by mouth as needed. Cold symp           BP 131/75  Pulse 70  Temp 98.2 F (36.8 C) (Oral)  Resp 18  SpO2 99%  LMP 10/17/2012  Physical Exam  Nursing note and vitals reviewed. Constitutional: She is oriented to person, place, and time. She appears well-developed and well-nourished.  Non-toxic appearance. No distress.  HENT:  Head: Normocephalic and atraumatic.  Eyes: Conjunctivae  normal, EOM and lids are normal. Pupils are equal, round, and reactive to light.  Neck: Normal range of motion. Neck supple. No tracheal deviation present. No mass present.  Cardiovascular: Normal rate, regular rhythm and normal heart sounds.  Exam reveals no gallop.   No murmur heard. Pulmonary/Chest: Effort normal and breath sounds normal. No stridor. No respiratory distress. She has no decreased breath sounds. She has no wheezes. She has no rhonchi. She has no rales.  Abdominal: Soft. Normal appearance and bowel sounds are normal. She exhibits no distension. There is no tenderness. There is no rigidity, no rebound, no guarding and no CVA tenderness.  Musculoskeletal: Normal range of motion. She exhibits no edema and no tenderness.  Neurological: She is alert and oriented to person, place, and time. She has normal strength. No cranial nerve deficit or sensory deficit. GCS eye subscore is 4. GCS verbal subscore is 5. GCS motor subscore is 6.  Skin: Skin is warm and dry. No abrasion and no rash noted.  Psychiatric: She has a normal mood and affect. Her speech is normal and behavior is normal.    ED Course  Procedures (including critical care time)   Labs Reviewed  URINALYSIS, ROUTINE W REFLEX MICROSCOPIC  URINE CULTURE   No results found.   No diagnosis found.    MDM  Pt given motrin for pain--feels better--suspect uri  Toy Baker, MD 10/19/12 1949  Toy Baker, MD 10/27/12 [phone]

## 2012-10-19 NOTE — ED Notes (Signed)
Patient c/o low back pain, nausea, and a non productive cough. Patient's daughter had  the flu recently. Patient also has an IUD. Patient denies vaginal discharge or dysuria.

## 2012-10-21 LAB — URINE CULTURE: Colony Count: >=100000

## 2012-10-22 NOTE — ED Notes (Signed)
+   urine Chart sent to EDP office for review. 

## 2012-10-24 ENCOUNTER — Telehealth (HOSPITAL_COMMUNITY): Payer: Self-pay | Admitting: Emergency Medicine

## 2012-10-24 NOTE — ED Notes (Signed)
Chart returned from EDP office. Prescribed Macrobid 100 mg. One tablet po bid x 7 days. #14. Prescribed by Heather VanWingen PA-C. °

## 2012-11-28 ENCOUNTER — Inpatient Hospital Stay (HOSPITAL_COMMUNITY)
Admission: AD | Admit: 2012-11-28 | Discharge: 2012-11-29 | Disposition: A | Discharge status: Hospice | Payer: Self-pay | Source: Ambulatory Visit | Attending: Obstetrics & Gynecology | Admitting: Obstetrics & Gynecology

## 2012-11-28 ENCOUNTER — Encounter (HOSPITAL_COMMUNITY): Payer: Self-pay | Admitting: *Deleted

## 2012-11-28 DIAGNOSIS — B9689 Other specified bacterial agents as the cause of diseases classified elsewhere: Secondary | ICD-10-CM

## 2012-11-28 DIAGNOSIS — R300 Dysuria: Secondary | ICD-10-CM | POA: Insufficient documentation

## 2012-11-28 DIAGNOSIS — N760 Acute vaginitis: Secondary | ICD-10-CM | POA: Insufficient documentation

## 2012-11-28 DIAGNOSIS — N949 Unspecified condition associated with female genital organs and menstrual cycle: Secondary | ICD-10-CM | POA: Insufficient documentation

## 2012-11-28 DIAGNOSIS — A499 Bacterial infection, unspecified: Secondary | ICD-10-CM | POA: Insufficient documentation

## 2012-11-28 DIAGNOSIS — N390 Urinary tract infection, site not specified: Secondary | ICD-10-CM

## 2012-11-28 HISTORY — DX: Acute vaginitis: B96.89

## 2012-11-28 HISTORY — DX: Other specified bacterial agents as the cause of diseases classified elsewhere: N76.0

## 2012-11-28 HISTORY — DX: Urinary tract infection, site not specified: N39.0

## 2012-11-28 LAB — COMPREHENSIVE METABOLIC PANEL
AST: 18 U/L (ref 0–37)
Albumin: 3.7 g/dL (ref 3.5–5.2)
Calcium: 9.2 mg/dL (ref 8.4–10.5)
Chloride: 103 mEq/L (ref 96–112)
Creatinine, Ser: 0.79 mg/dL (ref 0.50–1.10)
Sodium: 138 mEq/L (ref 135–145)

## 2012-11-28 LAB — CBC WITH DIFFERENTIAL/PLATELET
Basophils Absolute: 0 10*3/uL (ref 0.0–0.1)
Basophils Relative: 0 % (ref 0–1)
Eosinophils Relative: 1 % (ref 0–5)
HCT: 42 % (ref 36.0–46.0)
MCHC: 33.8 g/dL (ref 30.0–36.0)
Monocytes Absolute: 1.2 10*3/uL — ABNORMAL HIGH (ref 0.1–1.0)
Neutro Abs: 13.8 10*3/uL — ABNORMAL HIGH (ref 1.7–7.7)
Platelets: 201 10*3/uL (ref 150–400)
RDW: 13.1 % (ref 11.5–15.5)

## 2012-11-28 LAB — URINALYSIS, ROUTINE W REFLEX MICROSCOPIC
Glucose, UA: NEGATIVE mg/dL
Hgb urine dipstick: NEGATIVE
Specific Gravity, Urine: 1.024 (ref 1.005–1.030)

## 2012-11-28 LAB — URINE MICROSCOPIC-ADD ON

## 2012-11-28 LAB — POCT PREGNANCY, URINE: Preg Test, Ur: NEGATIVE

## 2012-11-28 NOTE — MAU Note (Signed)
Pt c/o low to mid abd pain all day today and a bad odor, urgency, and frequency  for the past three days.  State she has uti's and BV once or twice every month for the past 4 months.

## 2012-11-28 NOTE — ED Notes (Signed)
Pt c/o bilateral lower abdominal pain. Deines difficulty voiding, hematuria. Last BM today. Vaginal discharge is thin and white. Hx: of uti

## 2012-11-28 NOTE — MAU Note (Signed)
Pt LMP 11/11/2012, hx of UTI and BAV.  Tonight having pain with urination, lower abd pain and vag discharge.  Pt concerned that her IUD is causing BAV and UTI.

## 2012-11-29 ENCOUNTER — Emergency Department (HOSPITAL_COMMUNITY)
Admission: EM | Admit: 2012-11-29 | Discharge: 2012-11-29 | Discharge status: Against Medical Advice | Payer: Self-pay | Attending: Emergency Medicine | Admitting: Emergency Medicine

## 2012-11-29 DIAGNOSIS — R109 Unspecified abdominal pain: Secondary | ICD-10-CM | POA: Insufficient documentation

## 2012-11-29 DIAGNOSIS — F17200 Nicotine dependence, unspecified, uncomplicated: Secondary | ICD-10-CM | POA: Insufficient documentation

## 2012-11-29 DIAGNOSIS — Z3202 Encounter for pregnancy test, result negative: Secondary | ICD-10-CM | POA: Insufficient documentation

## 2012-11-29 LAB — URINALYSIS, ROUTINE W REFLEX MICROSCOPIC
Bilirubin Urine: NEGATIVE
Glucose, UA: NEGATIVE mg/dL
Ketones, ur: NEGATIVE mg/dL
Protein, ur: NEGATIVE mg/dL
pH: 6 (ref 5.0–8.0)

## 2012-11-29 LAB — URINE MICROSCOPIC-ADD ON

## 2012-11-29 LAB — WET PREP, GENITAL: Trich, Wet Prep: NONE SEEN

## 2012-11-29 MED ORDER — PHENAZOPYRIDINE HCL 200 MG PO TABS: 200.0000 mg | ORAL_TABLET | Freq: Three times a day (TID) | ORAL | Status: DC | PRN

## 2012-11-29 MED ORDER — CIPROFLOXACIN HCL 500 MG PO TABS: 500.0000 mg | ORAL_TABLET | Freq: Two times a day (BID) | ORAL | Status: DC

## 2012-11-29 MED ORDER — KETOROLAC TROMETHAMINE 60 MG/2ML IM SOLN
60.0000 mg | Freq: Once | INTRAMUSCULAR | Status: AC
  Administered 2012-11-29: 60 mg via INTRAMUSCULAR
  Filled 2012-11-29: qty 2

## 2012-11-29 MED ORDER — METRONIDAZOLE 500 MG PO TABS: 500.0000 mg | ORAL_TABLET | Freq: Two times a day (BID) | ORAL | Status: DC

## 2012-11-29 NOTE — MAU Provider Note (Signed)
History     CSN: [id number]  Arrival date and time: 11/28/12 2327   First Provider Initiated Contact with Patient 11/29/12 0004      Chief Complaint  Patient presents with  . Abdominal Pain  . Dysuria  . Vaginal Discharge   HPI [name] is a 32 y.o. G2P1011 who presents to MAU with complaint of dysuria and vaginal discharge. The patient states that she has had numerous UTIs and bacterial infections in the last few years. She has an IUD in place x 4 years and feels that this is somehow contributing to these infections. The patient complains of increased urinary frequency and urgency for the last few days. She also states that she has had an increase in her vaginal discharge. It is white and sometimes tinged with blood and has an odor. She has been nauseous on occasion without vomiting. She has pelvic pain that she rates at 10/10 now. She has not taken anything for the pain. She denies fever, but has felt "cold." She sees a family doctor in [place] for her previous gyn concerns.   OB History   Grav Para Term Preterm Abortions TAB SAB Ect Mult Living   2 1 1  1 1    1       Past Medical History  Diagnosis Date  . UTI (lower urinary tract infection)   . BV (bacterial vaginosis)     Past Surgical History  Procedure Laterality Date  . Cesarean section      Family History  Problem Relation Age of Onset  . Heart disease Father     History  Substance Use Topics  . Smoking status: Current Some Day Smoker -- 0.50 packs/day for 10 years    Types: Cigarettes  . Smokeless tobacco: Never Used  . Alcohol Use: Yes     Comment: seldom    Allergies: No Known Allergies  Prescriptions prior to admission  Medication Sig Dispense Refill  . Pseudoephedrine-APAP-DM (DAYQUIL MULTI-SYMPTOM) 60-650-20 MG/30ML LIQD Take 30 mLs by mouth as needed. Cold symp        Review of Systems  Constitutional: Positive for chills. Negative for fever and malaise/fatigue.   Gastrointestinal: Positive for nausea and abdominal pain. Negative for vomiting, diarrhea and constipation.  Genitourinary: Positive for dysuria, urgency and frequency.       + vaginal discharge + vaginal spotting  Musculoskeletal: Negative for back pain.   Physical Exam   Blood pressure 104/64, pulse 103, temperature 97.7 F (36.5 C), temperature source Oral, resp. rate 18, height 5\' 9"  (1.753 m), weight 166 lb 3.2 oz (75.388 kg), last menstrual period 11/11/2012.  Physical Exam  Constitutional: She is oriented to person, place, and time. She appears well-developed and well-nourished. No distress.  HENT:  Head: Normocephalic and atraumatic.  Cardiovascular: Normal rate, regular rhythm and normal heart sounds.   Respiratory: Effort normal and breath sounds normal. No respiratory distress.  GI: Soft. Bowel sounds are normal. She exhibits no distension and no mass. There is tenderness (moderate tenderness to palpation of the lower abdomen bilaterally, more prominent at midline). There is no rebound, no guarding and no CVA tenderness.  Genitourinary: Vagina normal. Uterus is not enlarged and not tender. Cervix exhibits discharge (off-white thin discharge noted at the cervical os and in the vaginal vault). Cervix exhibits no motion tenderness and no friability. Right adnexum displays no mass and no tenderness. Left adnexum displays no mass and no tenderness.  Neurological: She is alert and oriented to  person, place, and time.  Skin: Skin is warm and dry. No erythema.  Psychiatric: She has a normal mood and affect.   Results for orders placed during the hospital encounter of 11/28/12 (from the past 24 hour(s))  URINALYSIS, ROUTINE W REFLEX MICROSCOPIC     Status: Abnormal   Collection Time    11/28/12 11:40 PM      Result Value Range   Color, Urine YELLOW  YELLOW   APPearance HAZY (*) CLEAR   Specific Gravity, Urine 1.020  1.005 - 1.030   pH 6.0  5.0 - 8.0   Glucose, UA NEGATIVE   NEGATIVE mg/dL   Hgb urine dipstick SMALL (*) NEGATIVE   Bilirubin Urine NEGATIVE  NEGATIVE   Ketones, ur NEGATIVE  NEGATIVE mg/dL   Protein, ur NEGATIVE  NEGATIVE mg/dL   Urobilinogen, UA 0.2  0.0 - 1.0 mg/dL   Nitrite POSITIVE (*) NEGATIVE   Leukocytes, UA SMALL (*) NEGATIVE  URINE MICROSCOPIC-ADD ON     Status: Abnormal   Collection Time    11/28/12 11:40 PM      Result Value Range   Squamous Epithelial / LPF RARE  RARE   WBC, UA 3-6  <3 WBC/hpf   RBC / HPF 3-6  <3 RBC/hpf   Bacteria, UA MANY (*) RARE   Urine-Other MUCOUS PRESENT    POCT PREGNANCY, URINE     Status: None   Collection Time    11/28/12 11:46 PM      Result Value Range   Preg Test, Ur NEGATIVE  NEGATIVE  WET PREP, GENITAL     Status: Abnormal   Collection Time    11/29/12 12:10 AM      Result Value Range   Yeast Wet Prep HPF POC NONE SEEN  NONE SEEN   Trich, Wet Prep NONE SEEN  NONE SEEN   Clue Cells Wet Prep HPF POC MODERATE (*) NONE SEEN   WBC, Wet Prep HPF POC MODERATE (*) NONE SEEN    MAU Course  Procedures None  MDM GC/Chlamydia pending IM toradol given in MAU for pain  Assessment and Plan  A: UTI Bacterial vaginosis  P: Discharge home Rx for Cipro, Flagyl and Pyridium sent to patient's pharmacy Discussed hygiene products, probiotics and ways to avoid recurrence of BV and UTI Encouraged increased PO hydration Patient may take tylenol or ibuprofen for discomfort, however discussed no ibuprofen until tomorrow because of toradol injection today Encouraged follow-up with primary care doctor in [place]. If patient is unable to see her because of insurance issues, patient was given contact information for [facility]. Patient may need urology referral for recurrent UTIs Patient may return to MAU if her condition were to change or worsen   Freddi Starr, PA-C  11/29/2012, 12:04 AM

## 2012-11-30 LAB — URINE CULTURE: Colony Count: >=100000

## 2012-11-30 LAB — GC/CHLAMYDIA PROBE AMP
CT Probe RNA: NEGATIVE
GC Probe RNA: NEGATIVE

## 2012-12-01 NOTE — ED Notes (Signed)
+   Urine Patient treated with Cipro-sensitive to same-chart appended per protocol MD. 

## 2012-12-19 ENCOUNTER — Telehealth (HOSPITAL_COMMUNITY): Payer: Self-pay | Admitting: Emergency Medicine

## 2012-12-19 NOTE — ED Notes (Signed)
No response to letter sent after 30 days. Chart sent to Medical Records. °

## 2013-05-13 ENCOUNTER — Encounter (HOSPITAL_COMMUNITY): Payer: Self-pay

## 2013-05-13 ENCOUNTER — Inpatient Hospital Stay (HOSPITAL_COMMUNITY)
Admission: AD | Admit: 2013-05-13 | Discharge: 2013-05-13 | Disposition: A | Discharge status: Home / Self Care | Payer: Self-pay | Source: Ambulatory Visit | Attending: Obstetrics & Gynecology | Admitting: Obstetrics & Gynecology

## 2013-05-13 ENCOUNTER — Inpatient Hospital Stay (HOSPITAL_COMMUNITY): Payer: Self-pay

## 2013-05-13 DIAGNOSIS — Z1389 Encounter for screening for other disorder: Secondary | ICD-10-CM

## 2013-05-13 DIAGNOSIS — R109 Unspecified abdominal pain: Secondary | ICD-10-CM | POA: Insufficient documentation

## 2013-05-13 DIAGNOSIS — M545 Low back pain, unspecified: Secondary | ICD-10-CM | POA: Insufficient documentation

## 2013-05-13 DIAGNOSIS — O99891 Other specified diseases and conditions complicating pregnancy: Secondary | ICD-10-CM | POA: Insufficient documentation

## 2013-05-13 DIAGNOSIS — Z3490 Encounter for supervision of normal pregnancy, unspecified, unspecified trimester: Secondary | ICD-10-CM

## 2013-05-13 LAB — CBC
HCT: 38.1 % (ref 36.0–46.0)
MCH: 30.2 pg (ref 26.0–34.0)
MCV: 86 fL (ref 78.0–100.0)
RBC: 4.43 MIL/uL (ref 3.87–5.11)
WBC: 8.2 10*3/uL (ref 4.0–10.5)

## 2013-05-13 LAB — URINALYSIS, ROUTINE W REFLEX MICROSCOPIC
Bilirubin Urine: NEGATIVE
Hgb urine dipstick: NEGATIVE
Ketones, ur: NEGATIVE mg/dL
Nitrite: NEGATIVE
Specific Gravity, Urine: 1.02 (ref 1.005–1.030)
Urobilinogen, UA: 0.2 mg/dL (ref 0.0–1.0)

## 2013-05-13 LAB — HCG, QUANTITATIVE, PREGNANCY: hCG, Beta Chain, Quant, S: 55625 m[IU]/mL — ABNORMAL HIGH (ref <5)

## 2013-05-13 LAB — WET PREP, GENITAL
Trich, Wet Prep: NONE SEEN
Yeast Wet Prep HPF POC: NONE SEEN

## 2013-05-13 LAB — URINE MICROSCOPIC-ADD ON

## 2013-05-13 NOTE — MAU Provider Note (Signed)
Chief Complaint: Possible Pregnancy and Back Pain   First Provider Initiated Contact with Patient 05/13/13 1729     SUBJECTIVE HPI: Stephanie [name] is a 32 y.o. G3P1011 at [redacted]w[redacted]d by LMP who presents to maternity admissions reporting low back pain and some lower abdominal cramping x1 week that was intermittent until today when it became more constant.  Patient's last menstrual period was 03/17/2013.  She had two lighter periods in the two months before this, so her last normal periods was 3 months ago.  She denies LOF, vaginal bleeding, vaginal itching/burning, urinary symptoms, h/a, dizziness, n/v, or fever/chills.     Past Medical History  Diagnosis Date  . UTI (lower urinary tract infection)   . BV (bacterial vaginosis)    Past Surgical History  Procedure Laterality Date  . Cesarean section     History   Social History  . Marital Status: Single    Spouse Name: N/A    Number of Children: N/A  . Years of Education: N/A   Occupational History  . Not on file.   Social History Main Topics  . Smoking status: Current Some Day Smoker -- 0.50 packs/day for 10 years    Types: Cigarettes  . Smokeless tobacco: Never Used  . Alcohol Use: No     Comment: seldom  . Drug Use: No  . Sexual Activity: Yes    Birth Control/ Protection: None   Other Topics Concern  . Not on file   Social History Narrative  . No narrative on file   No current facility-administered medications on file prior to encounter.   No current outpatient prescriptions on file prior to encounter.   No Known Allergies  ROS: Pertinent items in HPI  OBJECTIVE Blood pressure 115/56, pulse 81, temperature 98 F (36.7 C), temperature source Oral, resp. rate 16, height 5' 7.5" (1.715 m), weight 79.924 kg (176 lb 3.2 oz), last menstrual period 03/17/2013, SpO2 100.00%. GENERAL: Well-developed, well-nourished female in no acute distress.  HEENT: Normocephalic HEART: normal rate RESP: normal effort ABDOMEN: Soft,  non-tender EXTREMITIES: Nontender, no edema NEURO: Alert and oriented Pelvic exam: Cervix pink, visually closed, without lesion, thin white/grey discharge, vaginal walls and external genitalia normal Bimanual exam: Cervix 0/long/high, firm, anterior, neg CMT, uterus nontender, ~7 week size, adnexa without tenderness, enlargement, or mass  LAB RESULTS Results for orders placed during the hospital encounter of 05/13/13 (from the past 24 hour(s))  URINALYSIS, ROUTINE W REFLEX MICROSCOPIC     Status: Abnormal   Collection Time    05/13/13  2:05 PM      Result Value Range   Color, Urine YELLOW  YELLOW   APPearance HAZY (*) CLEAR   Specific Gravity, Urine 1.020  1.005 - 1.030   pH 7.0  5.0 - 8.0   Glucose, UA NEGATIVE  NEGATIVE mg/dL   Hgb urine dipstick NEGATIVE  NEGATIVE   Bilirubin Urine NEGATIVE  NEGATIVE   Ketones, ur NEGATIVE  NEGATIVE mg/dL   Protein, ur NEGATIVE  NEGATIVE mg/dL   Urobilinogen, UA 0.2  0.0 - 1.0 mg/dL   Nitrite NEGATIVE  NEGATIVE   Leukocytes, UA TRACE (*) NEGATIVE  URINE MICROSCOPIC-ADD ON     Status: Abnormal   Collection Time    05/13/13  2:05 PM      Result Value Range   Squamous Epithelial / LPF MANY (*) RARE   WBC, UA 7-10  <3 WBC/hpf   RBC / HPF 3-6  <3 RBC/hpf   Bacteria, UA MANY (*)  RARE   Urine-Other MUCOUS PRESENT    POCT PREGNANCY, URINE     Status: Abnormal   Collection Time    05/13/13  2:34 PM      Result Value Range   Preg Test, Ur POSITIVE (*) NEGATIVE  CBC     Status: None   Collection Time    05/13/13  5:35 PM      Result Value Range   WBC 8.2  4.0 - 10.5 K/uL   RBC 4.43  3.87 - 5.11 MIL/uL   Hemoglobin 13.4  12.0 - 15.0 g/dL   HCT 16.1  09.6 - 04.5 %   MCV 86.0  78.0 - 100.0 fL   MCH 30.2  26.0 - 34.0 pg   MCHC 35.2  30.0 - 36.0 g/dL   RDW 40.9  81.1 - 91.4 %   Platelets 166  150 - 400 K/uL    IMAGING US Ob Comp Less 14 Wks  05/13/2013   CLINICAL DATA:  Right-sided pelvic and back pain. Cramping. Uncertain LMP.  EXAM:  OBSTETRIC <14 WK ULTRASOUND  TECHNIQUE: Transabdominal ultrasound was performed for evaluation of the gestation as well as the maternal uterus and adnexal regions.  COMPARISON:  None.  FINDINGS: Intrauterine gestational sac: Visualized/normal in shape.  Yolk sac:  Visualized  Embryo:  Visualized  Cardiac Activity: Visualized  Heart Rate: 155 bpm  CRL:   14  mm   7 w 5 d                  [place] EDC: 12/25/2013  Maternal uterus/adnexae: No mass or other significant abnormality identified. 4 cm left ovarian corpus luteum cyst incidentally noted.  IMPRESSION: Single living IUP measuring 7 weeks 5 days with [place] EDC of 12/25/2013.  No significant maternal uterine or adnexal abnormality identified.   Electronically Signed   By: Myles Rosenthal   On: 05/13/2013 19:05    ASSESSMENT 1. Normal IUP (intrauterine pregnancy) on prenatal ultrasound     PLAN Discharge home F/U with early prenatal care Increase PO fluids Pregnancy verification letter given Return to MAU as needed    Sharen Counter Certified Nurse-Midwife 05/13/2013  6:04 PM

## 2013-05-13 NOTE — MAU Note (Signed)
Pt states LMP-03/17/2013, however hasn't had normal cycle for past few months.

## 2013-05-13 NOTE — MAU Note (Signed)
Patient states she had a positive pregnancy test at [facility] yesterday,. States she has been having low back pain for about 2 days. Denies bleeding, discharge, nausea or vomiting.

## 2013-05-15 LAB — URINE CULTURE

## 2013-05-19 NOTE — MAU Provider Note (Signed)
Attestation of Attending Supervision of Advanced Practitioner (CNM/NP): Evaluation and management procedures were performed by the Advanced Practitioner under my supervision and collaboration.  I have reviewed the Advanced Practitioner's note and chart, and I agree with the management and plan.  HARRAWAY-SMITH, Georgetta Crafton 2:13 PM

## 2013-09-13 ENCOUNTER — Encounter (HOSPITAL_COMMUNITY): Payer: Self-pay | Admitting: Emergency Medicine

## 2013-09-13 ENCOUNTER — Emergency Department (HOSPITAL_COMMUNITY)
Admission: EM | Admit: 2013-09-13 | Discharge: 2013-09-13 | Disposition: A | Discharge status: Home / Self Care | Payer: Self-pay | Attending: Emergency Medicine | Admitting: Emergency Medicine

## 2013-09-13 DIAGNOSIS — K0530 Chronic periodontitis, unspecified: Secondary | ICD-10-CM

## 2013-09-13 DIAGNOSIS — K0381 Cracked tooth: Secondary | ICD-10-CM | POA: Insufficient documentation

## 2013-09-13 DIAGNOSIS — K0520 Aggressive periodontitis, unspecified: Secondary | ICD-10-CM | POA: Insufficient documentation

## 2013-09-13 DIAGNOSIS — F17200 Nicotine dependence, unspecified, uncomplicated: Secondary | ICD-10-CM | POA: Insufficient documentation

## 2013-09-13 DIAGNOSIS — J029 Acute pharyngitis, unspecified: Secondary | ICD-10-CM | POA: Insufficient documentation

## 2013-09-13 DIAGNOSIS — K047 Periapical abscess without sinus: Secondary | ICD-10-CM | POA: Insufficient documentation

## 2013-09-13 DIAGNOSIS — H9209 Otalgia, unspecified ear: Secondary | ICD-10-CM | POA: Insufficient documentation

## 2013-09-13 MED ORDER — IBUPROFEN 800 MG PO TABS: 800.0000 mg | ORAL_TABLET | Freq: Three times a day (TID) | ORAL | Status: DC | PRN

## 2013-09-13 MED ORDER — PENICILLIN V POTASSIUM 500 MG PO TABS: 500.0000 mg | ORAL_TABLET | Freq: Four times a day (QID) | ORAL | Status: AC

## 2013-09-13 NOTE — ED Notes (Signed)
Pt c/o dental pain, ear pain, sore throat x 2 days.

## 2013-09-13 NOTE — ED Provider Notes (Signed)
CSN: [id number]     Arrival date & time 09/13/13  1817 History   First MD Initiated Contact with Patient 09/13/13 1856     Chief Complaint  Patient presents with  . Dental Pain  . Sore Throat  . Otalgia   (Consider location/radiation/quality/duration/timing/severity/associated sxs/prior Treatment) Patient is a 32 y.o. female presenting with tooth pain, pharyngitis, and ear pain. The history is provided by the patient.  Dental Pain Location:  Lower Lower teeth location: Wisdom teeth in bilateral mandible. Quality:  Dull Severity:  Moderate Onset quality:  Gradual Duration:  2 days Timing:  Constant Progression:  Worsening Chronicity:  New Context: cap fell off and dental fracture   Context: not abscess and not trauma   Relieved by:  Nothing Worsened by:  Nothing tried Associated symptoms: no fever and no oral lesions   Sore Throat Pertinent negatives include no shortness of breath.  Otalgia Associated symptoms: sore throat   Associated symptoms: no cough, no fever and no tinnitus     Past Medical History  Diagnosis Date  . UTI (lower urinary tract infection)   . BV (bacterial vaginosis)    Past Surgical History  Procedure Laterality Date  . Cesarean section     Family History  Problem Relation Age of Onset  . Heart disease Father    History  Substance Use Topics  . Smoking status: Current Some Day Smoker -- 0.50 packs/day for 10 years    Types: Cigarettes  . Smokeless tobacco: Never Used  . Alcohol Use: No     Comment: seldom   OB History   Grav Para Term Preterm Abortions TAB SAB Ect Mult Living   3 1 1  1 1    1      Review of Systems  Constitutional: Negative for fever.  HENT: Positive for ear pain and sore throat. Negative for mouth sores and tinnitus.   Respiratory: Negative for cough and shortness of breath.   All other systems reviewed and are negative.    Allergies  Review of patient's allergies indicates no known allergies.  Home  Medications   Current Outpatient Rx  Name  Route  Sig  Dispense  Refill  . ibuprofen (ADVIL,MOTRIN) 200 MG tablet   Oral   Take 400 mg by mouth every 6 (six) hours as needed (pain).         [name] ibuprofen (ADVIL,MOTRIN) 800 MG tablet   Oral   Take 1 tablet (800 mg total) by mouth every 8 (eight) hours as needed.   30 tablet   0   . penicillin v potassium (VEETID) 500 MG tablet   Oral   Take 1 tablet (500 mg total) by mouth 4 (four) times daily.   40 tablet   0    BP 133/83  Pulse 74  Temp(Src) 98.5 F (36.9 C) (Oral)  Resp 18  SpO2 100%  LMP 03/17/2013 Physical Exam  Nursing note and vitals reviewed. Constitutional: She is oriented to person, place, and time. She appears well-developed and well-nourished. No distress.  HENT:  Head: Normocephalic and atraumatic.  Mouth/Throat: Abnormal dentition (broken teeth, L mandible).  Pericoronitis on bilateral mandibular wisdom teeth.  Eyes: EOM are normal. Pupils are equal, round, and reactive to light.  Neck: Normal range of motion. Neck supple.  Cardiovascular: Normal rate and regular rhythm.  Exam reveals no friction rub.   No murmur heard. Pulmonary/Chest: Effort normal and breath sounds normal. No respiratory distress. She has no wheezes. She has no rales.  Abdominal: Soft. She exhibits no distension. There is no tenderness. There is no rebound.  Musculoskeletal: Normal range of motion. She exhibits no edema.  Neurological: She is alert and oriented to person, place, and time.  Skin: She is not diaphoretic.    ED Course  Procedures (including critical care time) Labs Review Labs Reviewed - No data to display Imaging Review No results found.  EKG Interpretation   None       MDM   1. Pericoronitis   2. Periapical abscess    32 year old female presents with tooth pain. The normal the past 2 days. She has no difficulty breathing, no stridor. She stated some mild low-grade fevers and ear pain. She believes the  pain is coming from her bilateral lower posterior teeth. Here vitals are stable. She has rotation of the gums overriding her bilateral lower present teeth. She is simple continuous of the left mandible with out any visible abscess. Will give penicillin for possible periapical and motrin for pericoronitis. Stable for discharge.    Dagmar Hait, MD 09/13/13 [phone]

## 2014-03-18 ENCOUNTER — Encounter (HOSPITAL_COMMUNITY): Payer: Self-pay | Admitting: *Deleted

## 2014-06-05 ENCOUNTER — Emergency Department (HOSPITAL_COMMUNITY)
Admission: EM | Admit: 2014-06-05 | Discharge: 2014-06-05 | Disposition: A | Discharge status: Home / Self Care | Payer: Self-pay | Attending: Emergency Medicine | Admitting: Emergency Medicine

## 2014-06-05 ENCOUNTER — Encounter (HOSPITAL_COMMUNITY): Payer: Self-pay | Admitting: Emergency Medicine

## 2014-06-05 DIAGNOSIS — N390 Urinary tract infection, site not specified: Secondary | ICD-10-CM | POA: Insufficient documentation

## 2014-06-05 DIAGNOSIS — R300 Dysuria: Secondary | ICD-10-CM | POA: Insufficient documentation

## 2014-06-05 DIAGNOSIS — F17200 Nicotine dependence, unspecified, uncomplicated: Secondary | ICD-10-CM | POA: Insufficient documentation

## 2014-06-05 DIAGNOSIS — Z8742 Personal history of other diseases of the female genital tract: Secondary | ICD-10-CM | POA: Insufficient documentation

## 2014-06-05 DIAGNOSIS — Z8619 Personal history of other infectious and parasitic diseases: Secondary | ICD-10-CM | POA: Insufficient documentation

## 2014-06-05 DIAGNOSIS — Z3202 Encounter for pregnancy test, result negative: Secondary | ICD-10-CM | POA: Insufficient documentation

## 2014-06-05 LAB — POC URINE PREG, ED: Preg Test, Ur: NEGATIVE

## 2014-06-05 LAB — URINALYSIS, ROUTINE W REFLEX MICROSCOPIC
BILIRUBIN URINE: NEGATIVE
GLUCOSE, UA: NEGATIVE mg/dL
Ketones, ur: NEGATIVE mg/dL
Nitrite: POSITIVE — AB
Protein, ur: NEGATIVE mg/dL
Specific Gravity, Urine: 1.024 (ref 1.005–1.030)
Urobilinogen, UA: 0.2 mg/dL (ref 0.0–1.0)
pH: 6 (ref 5.0–8.0)

## 2014-06-05 LAB — URINE MICROSCOPIC-ADD ON

## 2014-06-05 MED ORDER — CEPHALEXIN 500 MG PO CAPS: 500.0000 mg | ORAL_CAPSULE | Freq: Four times a day (QID) | ORAL | Status: DC

## 2014-06-05 NOTE — ED Provider Notes (Signed)
CSN: [id number]     Arrival date & time 06/05/14  1440 History   First MD Initiated Contact with Patient 06/05/14 1633     Chief Complaint  Patient presents with  . Urinary Tract Infection     (Consider location/radiation/quality/duration/timing/severity/associated sxs/prior Treatment) Patient is a 33 y.o. female presenting with urinary tract infection. The history is provided by the patient and medical records. No language interpreter was used.  Urinary Tract Infection Pertinent negatives include no abdominal pain, chest pain, coughing, diaphoresis, fatigue, fever, headaches, nausea, rash or vomiting.    Stephanie [name] is a 33 y.o. female  with hx of recurrent UTI presents to the Emergency Department complaining of gradual, persistent, progressively worsening low back pain, with assocaited dysuria, urinary frequency and urgency onset 1.5 weeks ago.  Pt has tried OTC azo without relief.  Denies abd pain, N/V/D, vaginal discharge.  Pt reports symptoms similar to previous UTIs. No aggravating or alleviating factors.  Pt denies fever, chills, neck, neck pain, chest pain, shortness of breath, abdominal pain, nausea, vomiting, diarrhea, weakness, dizziness, syncope.     Past Medical History  Diagnosis Date  . UTI (lower urinary tract infection)   . BV (bacterial vaginosis)    Past Surgical History  Procedure Laterality Date  . Cesarean section     Family History  Problem Relation Age of Onset  . Heart disease Father    History  Substance Use Topics  . Smoking status: Current Some Day Smoker -- 0.50 packs/day for 10 years    Types: Cigarettes  . Smokeless tobacco: Never Used  . Alcohol Use: No     Comment: seldom   OB History   Grav Para Term Preterm Abortions TAB SAB Ect Mult Living   3 1 1  1 1    1      Review of Systems  Constitutional: Negative for fever, diaphoresis, appetite change, fatigue and unexpected weight change.  HENT: Negative for mouth sores.   Eyes:  Negative for visual disturbance.  Respiratory: Negative for cough, chest tightness, shortness of breath and wheezing.   Cardiovascular: Negative for chest pain.  Gastrointestinal: Negative for nausea, vomiting, abdominal pain, diarrhea and constipation.  Endocrine: Negative for polydipsia, polyphagia and polyuria.  Genitourinary: Positive for dysuria, urgency and frequency. Negative for hematuria, vaginal bleeding, vaginal discharge and vaginal pain.  Musculoskeletal: Negative for back pain and neck stiffness.  Skin: Negative for rash.  Allergic/Immunologic: Negative for immunocompromised state.  Neurological: Negative for syncope, light-headedness and headaches.  Hematological: Does not bruise/bleed easily.  Psychiatric/Behavioral: Negative for sleep disturbance. The patient is not nervous/anxious.       Allergies  Review of patient's allergies indicates no known allergies.  Home Medications   Prior to Admission medications   Medication Sig Start Date End Date Taking? Authorizing Provider  cephALEXin (KEFLEX) 500 MG capsule Take 1 capsule (500 mg total) by mouth 4 (four) times daily. 06/05/14   Shaeley Segall, PA-C  ibuprofen (ADVIL,MOTRIN) 200 MG tablet Take 400 mg by mouth every 6 (six) hours as needed (pain).    Historical Provider, MD  ibuprofen (ADVIL,MOTRIN) 800 MG tablet Take 1 tablet (800 mg total) by mouth every 8 (eight) hours as needed. 09/13/13   Evelina Bucy, MD   BP 108/72  Pulse 70  Temp(Src) 98.3 F (36.8 C)  Resp 18  Wt 184 lb 9 oz (83.717 kg)  SpO2 98%  LMP 06/05/2014 Physical Exam  Nursing note and vitals reviewed. Constitutional: She is oriented to person,  place, and time. She appears well-developed and well-nourished. No distress.  Awake, alert, nontoxic appearance  HENT:  Head: Normocephalic and atraumatic.  Mouth/Throat: Oropharynx is clear and moist. No oropharyngeal exudate.  Eyes: Conjunctivae are normal. No scleral icterus.  Neck: Normal range  of motion. Neck supple.  Cardiovascular: Normal rate, regular rhythm, normal heart sounds and intact distal pulses.   No murmur heard. Pulmonary/Chest: Effort normal and breath sounds normal. No respiratory distress. She has no wheezes.  Equal chest expansion  Abdominal: Soft. Bowel sounds are normal. She exhibits no distension and no mass. There is tenderness in the suprapubic area. There is no rebound and no guarding.  Mild suprapubic tenderness without guarding No CVA tenderness  Musculoskeletal: Normal range of motion. She exhibits no edema.  Neurological: She is alert and oriented to person, place, and time. She exhibits normal muscle tone. Coordination normal.  Speech is clear and goal oriented Moves extremities without ataxia  Skin: Skin is warm and dry. She is not diaphoretic. No erythema.  Psychiatric: She has a normal mood and affect.    ED Course  Procedures (including critical care time) Labs Review Labs Reviewed  URINALYSIS, ROUTINE W REFLEX MICROSCOPIC - Abnormal; Notable for the following:    APPearance CLOUDY (*)    Hgb urine dipstick LARGE (*)    Nitrite POSITIVE (*)    Leukocytes, UA SMALL (*)    All other components within normal limits  URINE MICROSCOPIC-ADD ON - Abnormal; Notable for the following:    Bacteria, UA MANY (*)    All other components within normal limits  URINE CULTURE  POC URINE PREG, ED    Imaging Review No results found.   EKG Interpretation None      MDM   Final diagnoses:  UTI (lower urinary tract infection)   Stephanie [name] presents with c/o consistent with UTI and hx of same.  Pt has been diagnosed with a UTI via urinalysis. Urine culture pending. Record review shows that previous UTIs have colonized Escherichia coli that are susceptible to Keflex. Patient will be prescribed this today. Pt is afebrile, no CVA tenderness, normotensive, and denies N/V. Pt to be dc home with antibiotics and instructions to follow up with PCP if  symptoms persist.  BP 108/72  Pulse 70  Temp(Src) 98.3 F (36.8 C)  Resp 18  Wt 184 lb 9 oz (83.717 kg)  SpO2 98%  LMP 06/05/2014    Abigail Butts, PA-C 06/06/14 [id number]

## 2014-06-05 NOTE — Discharge Instructions (Signed)
1. Medications: Keflex, usual home medications 2. Treatment: rest, drink plenty of fluids,  3. Follow Up: Please followup with your primary doctor for discussion of your diagnoses and further evaluation after today's visit; if you do not have a primary care doctor use the resource guide provided to find one;   Urinary Tract Infection Urinary tract infections (UTIs) can develop anywhere along your urinary tract. Your urinary tract is your body's drainage system for removing wastes and extra water. Your urinary tract includes two kidneys, two ureters, a bladder, and a urethra. Your kidneys are a pair of bean-shaped organs. Each kidney is about the size of your fist. They are located below your ribs, one on each side of your spine. CAUSES Infections are caused by microbes, which are microscopic organisms, including fungi, viruses, and bacteria. These organisms are so small that they can only be seen through a microscope. Bacteria are the microbes that most commonly cause UTIs. SYMPTOMS  Symptoms of UTIs may vary by age and gender of the patient and by the location of the infection. Symptoms in young women typically include a frequent and intense urge to urinate and a painful, burning feeling in the bladder or urethra during urination. Older women and men are more likely to be tired, shaky, and weak and have muscle aches and abdominal pain. A fever may mean the infection is in your kidneys. Other symptoms of a kidney infection include pain in your back or sides below the ribs, nausea, and vomiting. DIAGNOSIS To diagnose a UTI, your caregiver will ask you about your symptoms. Your caregiver also will ask to provide a urine sample. The urine sample will be tested for bacteria and white blood cells. White blood cells are made by your body to help fight infection. TREATMENT  Typically, UTIs can be treated with medication. Because most UTIs are caused by a bacterial infection, they usually can be treated with  the use of antibiotics. The choice of antibiotic and length of treatment depend on your symptoms and the type of bacteria causing your infection. HOME CARE INSTRUCTIONS  If you were prescribed antibiotics, take them exactly as your caregiver instructs you. Finish the medication even if you feel better after you have only taken some of the medication.  Drink enough water and fluids to keep your urine clear or pale yellow.  Avoid caffeine, tea, and carbonated beverages. They tend to irritate your bladder.  Empty your bladder often. Avoid holding urine for long periods of time.  Empty your bladder before and after sexual intercourse.  After a bowel movement, women should cleanse from front to back. Use each tissue only once. SEEK MEDICAL CARE IF:   You have back pain.  You develop a fever.  Your symptoms do not begin to resolve within 3 days. SEEK IMMEDIATE MEDICAL CARE IF:   You have severe back pain or lower abdominal pain.  You develop chills.  You have nausea or vomiting.  You have continued burning or discomfort with urination. MAKE SURE YOU:   Understand these instructions.  Will watch your condition.  Will get help right away if you are not doing well or get worse. Document Released: 06/25/2005 Document Revised: 03/16/2012 Document Reviewed: 10/24/2011 [facility] Patient Information 2015 [place], [place]. This information is not intended to replace advice given to you by your health care provider. Make sure you discuss any questions you have with your health care provider.   Emergency Department Resource Guide 1) Find a Doctor and Pay Out of  Pocket Although you won't have to find out who is covered by your insurance plan, it is a good idea to ask around and get recommendations. You will then need to call the office and see if the doctor you have chosen will accept you as a new patient and what types of options they offer for patients who are self-pay. Some doctors offer  discounts or will set up payment plans for their patients who do not have insurance, but you will need to ask so you aren't surprised when you get to your appointment.  2) Contact Your Local Health Department Not all health departments have doctors that can see patients for sick visits, but many do, so it is worth a call to see if yours does. If you don't know where your local health department is, you can check in your phone book. The CDC also has a tool to help you locate your state's health department, and many state websites also have listings of all of their local health departments.  3) Find a [facility] If your illness is not likely to be very severe or complicated, you may want to try a walk in clinic. These are popping up all over the country in pharmacies, drugstores, and shopping centers. They're usually staffed by nurse practitioners or physician assistants that have been trained to treat common illnesses and complaints. They're usually fairly quick and inexpensive. However, if you have serious medical issues or chronic medical problems, these are probably not your best option.  No Primary Care Doctor: - Call Health Connect at  [phone] - they can help you locate a primary care doctor that  accepts your insurance, provides certain services, etc. - Physician Referral Service- [phone]  Chronic Pain Problems: Organization         Address  Phone   Notes  [facility]  [phone] Patients need to be referred by their primary care doctor.   Medication Assistance: Organization         Address  Phone   Notes  [facility] Medication [facility] [address] [place], [place] [address] [phone] --Must be a resident of [facility] -- Must have NO insurance coverage whatsoever (no Medicaid/ Medicare, etc.) -- The pt. MUST have a primary care doctor that directs their care regularly and follows them in the community   MedAssist   [phone]   [employer]  [phone]    Agencies that provide inexpensive medical care: Organization         Address  Phone   Notes  [place]  [phone]   [facility]    [phone]   [facility] [place], [place] [address] [phone]   [place] [address] [place] [phone]   Planned Parenthood    [phone]   [facility]    [phone]   [place] and [place] [address], [place] Phone:  [phone], Fax:  [phone] Hours of Operation:  9 am - 6 pm, M-F.  Also accepts Medicaid/Medicare and self-pay.  [facility] for [place], [address], [place] Phone: [phone], Fax: [phone]. Hours of Operation:  8:30 am - 5:30 pm, M-F.  Also accepts Medicaid and self-pay.  [facility] [place] [address] [employer] Phone: [phone]   [facility] [address] [place], [place] [phone], Ext. [phone] Mondays & Thursdays: 7-9 AM.  First 15 patients are seen on a first come, first serve basis.    [place] Providers:  Organization         Address  Phone   Notes  [facility] [address], [place] [phone] Also accepts self-pay patients.  [facility] [address], [place]  [phone]   [place], [address], [place] [phone]   [facility] [address] [place] [phone]   Lucianne Lei [address], [place]   [phone] Only accepts [place] Access [place] patients after they have their name applied to their card.   Self-Pay (no insurance) in [facility]:  Organization         Address  Phone   Notes  Sickle Cell Patients, [facility] Internal Medicine [place] [phone]   [facility] Urgent Care [place] [phone]   [facility]  [place], [place], [place] [phone]   [facility]/Dr. Osei-Bonsu  [address], [place] or [address], [place] [phone] Phone number for both [place] and [place] locations is the same.  Urgent Medical and [facility] [address] [place] [phone]   [facility] [address] [place] or [address] [phone]   [facility] [address] [place] [phone], phone; [phone], fax Sees patients 1st and 3rd Saturday of every month.  Must not qualify for public or private insurance (i.e. Medicaid, Medicare, [place] Health Choice, Veterans' Benefits)  Household income should be no more than 200% of the poverty level The clinic cannot treat you if you are pregnant or think you are pregnant  Sexually transmitted diseases are not treated at the clinic.    Dental Care: Organization         Address  Phone  Notes  [facility] Department of [facility] [place] [phone] Accepts children up to age 32 who are enrolled in [place] or [place]; pregnant women with a Medicaid card; and children who have applied for Medicaid or [place] Health Choice, but were declined, whose parents can pay a reduced fee at time of service.  [facility] Department of [facility]  [address], [place] [phone] Accepts children up to age 43 who are enrolled in [place] or [place]; pregnant women with a Medicaid card; and children who have applied for Medicaid or [place] Health Choice, but were declined, whose parents can pay a reduced fee at time of service.  [place] Adult Dental Access PROGRAM  [place]  [phone] Patients are seen by appointment only. Walk-ins are not accepted. [place] will see patients 34 years of age and older. Monday - Tuesday (8am-5pm) Most Wednesdays (8:30-5pm) $30 per visit, cash only  [facility] Adult Dental Access PROGRAM  [address], [facility] [phone] Patients are seen by appointment only. Walk-ins are not accepted. [place] will see patients 51 years of age and older. One Wednesday Evening (Monthly: Volunteer Based).  $30 per visit, cash only  [place]  [phone] for adults; Children under age 70, call Graduate Pediatric Dentistry at [phone]. Children aged 68-14, please call [phone] to request a pediatric application.  Dental services are provided in all areas of dental care including fillings, crowns and bridges, complete and partial dentures, implants, gum treatment, root canals, and extractions. Preventive care is also provided. Treatment is provided to both adults and children. Patients are selected via a lottery and there is often a waiting list.   [facility] [address], [place]  [phone] [email or web address]   [facility] [address] [place], [place] [phone], Ext. [phone] Second and Fourth Thursday of each month, opens at 6:30 AM; Clinic ends at 9 AM.  Patients are seen on a first-come first-served basis, and a limited number are seen during each clinic.   [facility]  [address] Hillard Danker [place], [place] [phone]   Eligibility Requirements You must have lived in [place], [place], or [place] counties for at least the last three months.   You cannot be eligible for state or federal sponsored [employer], including [employer], [place], or [employer].   You generally cannot be eligible for healthcare insurance through your employer.    How to apply: Eligibility screenings are held every Tuesday and Wednesday  afternoon from 1:00 pm until 4:00 pm. You do not need an appointment for the interview!  [facility] [address], [place], [place]   [place]  [place] Department  [place]  [phone]    Behavioral Health Resources in the Community: Intensive Outpatient Programs Organization         Address  Phone  Notes  [place]. [address] [place], [place] [phone]   [facility] [address] [place], [place]   ADS: Alcohol & Drug S[facility] [address] [place], [place]   [place] [address]  [place], [place] or [phone]   Substance Abuse Resources Organization         Address  Phone  Notes  Alcohol and Drug Services  [phone]   [place]  [phone]   The [place]   Chinita Pester  9055209466   Residential & Outpatient Substance Abuse Program  [phone]   Psychological Services Organization         Address  Phone  Notes  [facility] [place]  [place]  [phone]   [place] [address], [place] or [phone]    Mobile Crisis Teams Organization         Address  Phone  Notes  [facility], Mobile Crisis Care Unit  [phone]   [facility]  [address] [place], [place]   [place] [address], [place] [phone]    Self-Help/Support Groups Organization         Address  Phone             Notes  [place]. of [place] - variety of support groups  [place] Call for more information  Narcotics Anonymous (NA), [facility] [address], [employer] [place]  2 meetings at this location   Materials engineer  Address  Phone  Notes  [facility] [address],    [place]  [phone]   [facility]  [address], [place] [id number], [place], [place]   [place], [place] [phone] Admissions: 8am-3pm M-F  Incentives Substance [place] [address],    [place], [place] [phone]   [facility] [address] [place], [place], [place]   The [facility] [address],  [place], [place]   [facility] - Intensive Outpatient [address]een [address], [place], [place]   [facility] ([place] [address]  [place], [place] [phone] or [phone]   [facility] Services (RTS) [address] [place], [place] Accepts Medicaid  [facility] [place] [address],  [place] [phone] Substance Abuse/Addiction Treatment   [facility] Organization         Address  Phone  Notes  [facility]  [phone]   Domenic Schwab, PhD [address] Arlis Porta [place], [place]   [phone] or [phone]   [place], [place] [phone]   [facility] [address], [place], [place] [phone] Insurance/Medicaid/sponsorship through [facility] and Families [address], [address] [place]                                    [place], [place] [phone] [place] [address] [place], [place] [phone]    Dr. Adele Schilder  [phone]   [facility] [place] Dept. 1) [address] [place] 2) [place] 3)  [place] [address], [place] [phone]  [phone]   [place] [phone] or [phone] (After Hours)

## 2014-06-05 NOTE — ED Notes (Signed)
Per pt sts she has been having lower back pain, dysuria and foul urine x 1 week.

## 2014-06-06 NOTE — ED Provider Notes (Signed)
Medical screening examination/treatment/procedure(s) were performed by non-physician practitioner and as supervising physician I was immediately available for consultation/collaboration.   EKG Interpretation None        Orpah Greek, MD 06/06/14 [phone]

## 2014-06-07 LAB — URINE CULTURE: Colony Count: >=100000

## 2014-06-09 NOTE — ED Notes (Signed)
Urine culture (+) E.Coli, currently treated with Cephalexin, OK per Karlene Einstein, Pharm

## 2014-07-31 ENCOUNTER — Encounter (HOSPITAL_COMMUNITY): Payer: Self-pay | Admitting: Emergency Medicine

## 2014-08-04 ENCOUNTER — Encounter (HOSPITAL_COMMUNITY): Payer: Self-pay | Admitting: Emergency Medicine

## 2014-08-04 ENCOUNTER — Emergency Department (HOSPITAL_COMMUNITY)
Admission: EM | Admit: 2014-08-04 | Discharge: 2014-08-04 | Disposition: A | Discharge status: Home / Self Care | Payer: Self-pay | Attending: Emergency Medicine | Admitting: Emergency Medicine

## 2014-08-04 DIAGNOSIS — M549 Dorsalgia, unspecified: Secondary | ICD-10-CM | POA: Insufficient documentation

## 2014-08-04 DIAGNOSIS — N390 Urinary tract infection, site not specified: Secondary | ICD-10-CM | POA: Insufficient documentation

## 2014-08-04 DIAGNOSIS — Y9289 Other specified places as the place of occurrence of the external cause: Secondary | ICD-10-CM | POA: Insufficient documentation

## 2014-08-04 DIAGNOSIS — T192XXA Foreign body in vulva and vagina, initial encounter: Secondary | ICD-10-CM | POA: Insufficient documentation

## 2014-08-04 DIAGNOSIS — Y9389 Activity, other specified: Secondary | ICD-10-CM | POA: Insufficient documentation

## 2014-08-04 DIAGNOSIS — N898 Other specified noninflammatory disorders of vagina: Secondary | ICD-10-CM | POA: Insufficient documentation

## 2014-08-04 DIAGNOSIS — Z720 Tobacco use: Secondary | ICD-10-CM | POA: Insufficient documentation

## 2014-08-04 DIAGNOSIS — Z792 Long term (current) use of antibiotics: Secondary | ICD-10-CM | POA: Insufficient documentation

## 2014-08-04 DIAGNOSIS — Z3202 Encounter for pregnancy test, result negative: Secondary | ICD-10-CM | POA: Insufficient documentation

## 2014-08-04 LAB — URINALYSIS, ROUTINE W REFLEX MICROSCOPIC
Bilirubin Urine: NEGATIVE
GLUCOSE, UA: 250 mg/dL — AB
Hgb urine dipstick: NEGATIVE
Ketones, ur: NEGATIVE mg/dL
LEUKOCYTES UA: NEGATIVE
Nitrite: POSITIVE — AB
PROTEIN: NEGATIVE mg/dL
Specific Gravity, Urine: 1.03 (ref 1.005–1.030)
Urobilinogen, UA: 0.2 mg/dL (ref 0.0–1.0)
pH: 5.5 (ref 5.0–8.0)

## 2014-08-04 LAB — WET PREP, GENITAL
CLUE CELLS WET PREP: NONE SEEN
Trich, Wet Prep: NONE SEEN
Yeast Wet Prep HPF POC: NONE SEEN

## 2014-08-04 LAB — URINE MICROSCOPIC-ADD ON

## 2014-08-04 LAB — POC URINE PREG, ED: PREG TEST UR: NEGATIVE

## 2014-08-04 MED ORDER — AZITHROMYCIN 250 MG PO TABS
1000.0000 mg | ORAL_TABLET | Freq: Once | ORAL | Status: AC
  Administered 2014-08-04: 1000 mg via ORAL
  Filled 2014-08-04: qty 4

## 2014-08-04 MED ORDER — SULFAMETHOXAZOLE-TRIMETHOPRIM 800-160 MG PO TABS: 1.0000 | ORAL_TABLET | Freq: Two times a day (BID) | ORAL | Status: DC

## 2014-08-04 MED ORDER — LIDOCAINE HCL 1 % IJ SOLN
INTRAMUSCULAR | Status: AC
  Filled 2014-08-04: qty 20

## 2014-08-04 MED ORDER — PHENAZOPYRIDINE HCL 200 MG PO TABS
200.0000 mg | ORAL_TABLET | Freq: Once | ORAL | Status: AC
  Administered 2014-08-04: 200 mg via ORAL
  Filled 2014-08-04: qty 1

## 2014-08-04 MED ORDER — CEFTRIAXONE SODIUM 250 MG IJ SOLR
250.0000 mg | Freq: Once | INTRAMUSCULAR | Status: AC
  Administered 2014-08-04: 250 mg via INTRAMUSCULAR
  Filled 2014-08-04: qty 250

## 2014-08-04 MED ORDER — PHENAZOPYRIDINE HCL 200 MG PO TABS: 200.0000 mg | ORAL_TABLET | Freq: Three times a day (TID) | ORAL | Status: DC | PRN

## 2014-08-04 NOTE — ED Provider Notes (Signed)
CSN: [id number]     Arrival date & time 08/04/14  1359 History   First MD Initiated Contact with Patient 08/04/14 1511     Chief Complaint  Patient presents with  . Urinary Frequency  . Vaginal Discharge     (Consider location/radiation/quality/duration/timing/severity/associated sxs/prior Treatment) HPI Comments: Stephanie [name] is a 33 y.o. female with a PMHx of recurrent UTI and BV, who presents to the ED with complaints of increased urinary frequency, urgency, and dysuria x3-4 days, and vaginal discharge with odor. Reports that she has not visualized the discharge, but it has a somewhat fishy odor. She also endorses associated 7/10 crampy intermittent lower back pain, nonradiating, with no known aggravating or alleviating factors. Sexually active with 1 partner, unprotected. LMP 2wks ago. Currently taking OCPs. Denies fevers, chills, CP, SOB, abd pain, n/v/d/c, obstipation, melena, hematochezia, hematuria, vaginal bleeding, flank pain, new soaps or detergents, myalgias, arthralgias, or cauda equina symptoms. Denies recent trauma or injury. Denies EtOH use or suspicious food intake.   Patient is a 33 y.o. female presenting with frequency. The history is provided by the patient. No language interpreter was used.  Urinary Frequency This is a new problem. The current episode started in the past 7 days. The problem occurs constantly. The problem has been unchanged. Associated symptoms include urinary symptoms. Pertinent negatives include no abdominal pain, arthralgias, change in bowel habit, chest pain, chills, fever, headaches, joint swelling, myalgias, nausea, numbness, vomiting or weakness. Nothing aggravates the symptoms. She has tried nothing for the symptoms. The treatment provided no relief.    Past Medical History  Diagnosis Date  . UTI (lower urinary tract infection)   . BV (bacterial vaginosis)    Past Surgical History  Procedure Laterality Date  . Cesarean section     Family  History  Problem Relation Age of Onset  . Heart disease Father    History  Substance Use Topics  . Smoking status: Current Some Day Smoker -- 0.50 packs/day for 10 years    Types: Cigarettes  . Smokeless tobacco: Never Used  . Alcohol Use: No     Comment: seldom   OB History    Gravida Para Term Preterm AB TAB SAB Ectopic Multiple Living   3 1 1  1 1    1      Review of Systems  Constitutional: Negative for fever and chills.  Respiratory: Negative for shortness of breath.   Cardiovascular: Negative for chest pain.  Gastrointestinal: Negative for nausea, vomiting, abdominal pain, diarrhea, constipation, blood in stool, abdominal distention and change in bowel habit.  Genitourinary: Positive for dysuria, urgency, frequency and vaginal discharge. Negative for hematuria, flank pain, decreased urine volume, vaginal bleeding, difficulty urinating, vaginal pain and menstrual problem.  Musculoskeletal: Positive for back pain. Negative for myalgias, joint swelling, arthralgias and gait problem.  Skin: Negative for color change.  Neurological: Negative for weakness, light-headedness, numbness and headaches.   10 Systems reviewed and are negative for acute change except as noted in the HPI.    Allergies  Review of patient's allergies indicates no known allergies.  Home Medications   Prior to Admission medications   Medication Sig Start Date End Date Taking? Authorizing Provider  cephALEXin (KEFLEX) 500 MG capsule Take 1 capsule (500 mg total) by mouth 4 (four) times daily. 06/05/14   Hannah Muthersbaugh, PA-C  ibuprofen (ADVIL,MOTRIN) 200 MG tablet Take 400 mg by mouth every 6 (six) hours as needed (pain).    Historical Provider, MD  ibuprofen (ADVIL,MOTRIN) 800  MG tablet Take 1 tablet (800 mg total) by mouth every 8 (eight) hours as needed. 09/13/13   Evelina Bucy, MD   BP 129/81 mmHg  Pulse 86  Temp(Src) 98.4 F (36.9 C) (Oral)  Resp 20  SpO2 98%  LMP 07/21/2014 (Approximate)   Breastfeeding? No Physical Exam  Constitutional: She is oriented to person, place, and time. Vital signs are normal. She appears well-developed and well-nourished.  Non-toxic appearance. No distress.  Afebrile, nontoxic, NAD  HENT:  Head: Normocephalic and atraumatic.  Mouth/Throat: Mucous membranes are normal.  Eyes: Conjunctivae and EOM are normal. Right eye exhibits no discharge. Left eye exhibits no discharge.  Neck: Normal range of motion. Neck supple.  Cardiovascular: Normal rate, regular rhythm, normal heart sounds and intact distal pulses.  Exam reveals no gallop and no friction rub.   No murmur heard. Pulmonary/Chest: Effort normal and breath sounds normal. No respiratory distress. She has no decreased breath sounds. She has no wheezes. She has no rhonchi. She has no rales.  Abdominal: Soft. Normal appearance and bowel sounds are normal. She exhibits no distension. There is tenderness in the suprapubic area. There is no rigidity, no rebound, no guarding, no CVA tenderness, no tenderness at McBurney's point and negative Murphy's sign.    Soft, ND, +BS throughout, mildly TTP in suprapubic region, no r/g/r, neg murphy's, neg mcburney's, no CVA TTP  Genitourinary: Uterus normal. Pelvic exam was performed with patient supine. There is no rash, tenderness or lesion on the right labia. There is no rash, tenderness or lesion on the left labia. Cervix exhibits no motion tenderness, no discharge and no friability. Right adnexum displays no mass, no tenderness and no fullness. Left adnexum displays no mass, no tenderness and no fullness. No erythema, tenderness or bleeding in the vagina. There is a foreign body in the vagina. No signs of injury around the vagina. Vaginal discharge found.  No rashes, lesions, or tenderness to external genitalia. No erythema, injury, or tenderness to vaginal mucosa. Retained tampon located in vaginal vault. Malodorous thin grey vaginal discharge within vaginal vault. No  vaginal bleeding. No adnexal masses, tenderness, or fullness. No CMT, cervical friability, or discharge from cervical os. Uterus non-deviated, mobile, nonTTP, and without enlargement.   Musculoskeletal: Normal range of motion.  Neurological: She is alert and oriented to person, place, and time. She has normal strength. No sensory deficit.  Skin: Skin is warm, dry and intact. No rash noted.  Psychiatric: She has a normal mood and affect.  Nursing note and vitals reviewed.   ED Course  Procedures (including critical care time) Labs Review Labs Reviewed  WET PREP, GENITAL - Abnormal; Notable for the following:    WBC, Wet Prep HPF POC FEW (*)    All other components within normal limits  URINALYSIS, ROUTINE W REFLEX MICROSCOPIC - Abnormal; Notable for the following:    Color, Urine AMBER (*)    APPearance CLOUDY (*)    Glucose, UA 250 (*)    Nitrite POSITIVE (*)    All other components within normal limits  URINE MICROSCOPIC-ADD ON - Abnormal; Notable for the following:    Squamous Epithelial / LPF FEW (*)    Bacteria, UA MANY (*)    All other components within normal limits  GC/CHLAMYDIA PROBE AMP  POC URINE PREG, ED    Imaging Review No results found.   EKG Interpretation None      MDM   Final diagnoses:  UTI (lower urinary tract infection)  Vaginal discharge  Vaginal foreign body, initial encounter    33y/o female with UTI symptoms and vaginal discharge. Retained tampon in vaginal vault located, pt recalls placing it in yesterday and forgot about it. U/A with +nitrites, 3-6 WBC, many bacteria therefore will tx for UTI given her sxs, will give Bactrim since this is more cost effective for pt. Pelvic exam unconcerning for PID/TOA, Upreg neg therefore doubt ectopic, doubt torsion, no need for transvaginal U/S today. Doubt pyelo or nephrolithiasis. Will empirically tx for GC/CT since wet prep had few WBC without yeast/trich/clue cells. Will give pyridium today, and d/c home  with rx. Will have her f/up with women[facility] outpt clinic for any further vaginal complaints.  BP 129/81 mmHg  Pulse 86  Temp(Src) 98.4 F (36.9 C) (Oral)  Resp 20  SpO2 98%  LMP 07/21/2014 (Approximate)  Breastfeeding? No  Meds ordered this encounter  Medications  . phenazopyridine (PYRIDIUM) tablet 200 mg    Sig:   . azithromycin (ZITHROMAX) tablet 1,000 mg    Sig:    And  . cefTRIAXone (ROCEPHIN) injection 250 mg    Sig:     Order Specific Question:  Antibiotic Indication:    Answer:  STD  . sulfamethoxazole-trimethoprim (BACTRIM DS,SEPTRA DS) 800-160 MG per tablet    Sig: Take 1 tablet by mouth 2 (two) times daily.    Dispense:  14 tablet    Refill:  0    Order Specific Question:  Supervising Provider    Answer:  Noemi Chapel D [[id number]]  . phenazopyridine (PYRIDIUM) 200 MG tablet    Sig: Take 1 tablet (200 mg total) by mouth 3 (three) times daily as needed for pain.    Dispense:  6 tablet    Refill:  0    Order Specific Question:  Supervising Provider    Answer:  Johnna Acosta [address] [place], PA-C 08/04/14 [place] Yao, MD 08/04/14 2320

## 2014-08-04 NOTE — ED Notes (Addendum)
Pt from home reports that she has had frequent UTI's with the last one being approx two months ago. Pt denies dysuria, but reports frequency with vaginal discharge and "different odor". Pt adds that she is having bilateral lower back pain. Pt sts that she has recent unprotected sex with her steady partner. Pt denies N/V/D, fever. Pt is A&O and in NAD.

## 2014-08-04 NOTE — Discharge Instructions (Signed)
Stay very well hydrated with plenty of water throughout the day. Take antibiotic until completed. Take Pyridium for pain relief, but don't take this longer than 3 days, and be aware that it may turn your urine bright orange. This is a harmless side effect. Follow up with [facility] in 1 week for recheck of ongoing symptoms but return to ER for emergent changing or worsening of symptoms. You have been treated for gonorrhea and chlamydia in the ER but the hospital will call you if lab is positive. Please seek immediate care if you develop the following: You develop back pain.  Your symptoms are no better, or worse in 3 days. There is severe back pain or lower abdominal pain.  You develop chills.  You have a fever.  There is nausea or vomiting.  There is continued burning or discomfort with urination.    Urinary Tract Infection A urinary tract infection (UTI) can occur any place along the urinary tract. The tract includes the kidneys, ureters, bladder, and urethra. A type of germ called bacteria often causes a UTI. UTIs are often helped with antibiotic medicine.  HOME CARE   If given, take antibiotics as told by your doctor. Finish them even if you start to feel better.  Drink enough fluids to keep your pee (urine) clear or pale yellow.  Avoid tea, drinks with caffeine, and bubbly (carbonated) drinks.  Pee often. Avoid holding your pee in for a long time.  Pee before and after having sex (intercourse).  Wipe from front to back after you poop (bowel movement) if you are a woman. Use each tissue only once. GET HELP RIGHT AWAY IF:   You have back pain.  You have lower belly (abdominal) pain.  You have chills.  You feel sick to your stomach (nauseous).  You throw up (vomit).  Your burning or discomfort with peeing does not go away.  You have a fever.  Your symptoms are not better in 3 days. MAKE SURE YOU:   Understand these instructions.  Will watch your  condition.  Will get help right away if you are not doing well or get worse. Document Released: 03/03/2008 Document Revised: 06/09/2012 Document Reviewed: 04/15/2012 [facility] Patient Information 2015 [place], [place]. This information is not intended to replace advice given to you by your health care provider. Make sure you discuss any questions you have with your health care provider.  Vaginal Foreign Body A vaginal foreign body is any object that gets stuck or left inside the vagina. This can cause:  Bleeding.  Itching.  Pain.  Swelling.  Rash. In most cases, symptoms go away once the object is found and taken out. Rarely, an object can break through the walls of the vagina and cause a serious infection. HOME CARE  Take all medicines as told by your doctor.  If you were given an antibiotic medicine, finish it all even if you start to feel better.  Do not have sex or use tampons until your doctor says it is okay.  Do not clean the vagina with a jet of water (douche) unless told by your doctor.  Keep all follow-up visits as told by your doctor. This is important. GET HELP IF:  You have a fever.  You have belly (abdominal) pain.  You have pain when you pee (urinate). GET HELP RIGHT AWAY IF:   You have very bad belly pain.  You have heavy bleeding or fluid coming from the vagina. MAKE SURE YOU:  Understand  these instructions.  Will watch your condition.  Will get help right away if you are not doing well or get worse. Document Released: 09/03/2009 Document Revised: 01/30/2014 Document Reviewed: 07/15/2013 [facility] Patient Information 2015 [place], [place]. This information is not intended to replace advice given to you by your health care provider. Make sure you discuss any questions you have with your health care provider.

## 2014-08-05 LAB — GC/CHLAMYDIA PROBE AMP
CT PROBE, AMP APTIMA: NEGATIVE
GC PROBE AMP APTIMA: NEGATIVE

## 2014-10-31 ENCOUNTER — Encounter (HOSPITAL_COMMUNITY): Payer: Self-pay | Admitting: *Deleted

## 2014-10-31 ENCOUNTER — Emergency Department (INDEPENDENT_AMBULATORY_CARE_PROVIDER_SITE_OTHER)
Admission: EM | Admit: 2014-10-31 | Discharge: 2014-10-31 | Disposition: A | Discharge status: Home / Self Care | Payer: Self-pay | Source: Home / Self Care | Attending: Family Medicine | Admitting: Family Medicine

## 2014-10-31 DIAGNOSIS — N390 Urinary tract infection, site not specified: Secondary | ICD-10-CM

## 2014-10-31 DIAGNOSIS — A499 Bacterial infection, unspecified: Secondary | ICD-10-CM

## 2014-10-31 DIAGNOSIS — N760 Acute vaginitis: Secondary | ICD-10-CM

## 2014-10-31 DIAGNOSIS — B9689 Other specified bacterial agents as the cause of diseases classified elsewhere: Secondary | ICD-10-CM

## 2014-10-31 LAB — POCT URINALYSIS DIP (DEVICE)
Bilirubin Urine: NEGATIVE
Glucose, UA: NEGATIVE mg/dL
HGB URINE DIPSTICK: NEGATIVE
Ketones, ur: NEGATIVE mg/dL
Nitrite: POSITIVE — AB
Protein, ur: NEGATIVE mg/dL
Specific Gravity, Urine: 1.025 (ref 1.005–1.030)
Urobilinogen, UA: 0.2 mg/dL (ref 0.0–1.0)
pH: 6 (ref 5.0–8.0)

## 2014-10-31 LAB — POCT PREGNANCY, URINE: Preg Test, Ur: NEGATIVE

## 2014-10-31 MED ORDER — CEPHALEXIN 500 MG PO CAPS
500.0000 mg | ORAL_CAPSULE | Freq: Four times a day (QID) | ORAL | Status: DC
Start: 2014-10-31 — End: 2015-01-08

## 2014-10-31 MED ORDER — METRONIDAZOLE 0.75 % VA GEL: 1.0000 | Freq: Every day | VAGINAL | Status: DC

## 2014-10-31 NOTE — ED Notes (Signed)
Pt  Has  Symptoms  Of  Urinary  Problems  To include    Vaginal   Irritation         And  Urinary  Discomfort             With  A  Foul  Odor  -  Symptoms        Over  The  Last  2  Weeks       denys  Any  Vaginal  Bleeding or     Discharge

## 2014-10-31 NOTE — Discharge Instructions (Signed)
Take all of medicine as directed, drink lots of fluids, see your doctor if further problems. °

## 2014-10-31 NOTE — ED Provider Notes (Signed)
CSN: [id number]     Arrival date & time 10/31/14  1152 History   None    Chief Complaint  Patient presents with  . Urinary Tract Infection   (Consider location/radiation/quality/duration/timing/severity/associated sxs/prior Treatment) Patient is a 34 y.o. female presenting with urinary tract infection. The history is provided by the patient.  Urinary Tract Infection This is a recurrent problem. The current episode started more than 1 week ago (2wk h/o sx.). Pertinent negatives include no chest pain and no abdominal pain.    Past Medical History  Diagnosis Date  . UTI (lower urinary tract infection)   . BV (bacterial vaginosis)    Past Surgical History  Procedure Laterality Date  . Cesarean section     Family History  Problem Relation Age of Onset  . Heart disease Father    History  Substance Use Topics  . Smoking status: Current Some Day Smoker -- 0.50 packs/day for 10 years    Types: Cigarettes  . Smokeless tobacco: Never Used  . Alcohol Use: No     Comment: seldom   OB History    Gravida Para Term Preterm AB TAB SAB Ectopic Multiple Living   3 1 1  1 1    1      Review of Systems  Constitutional: Negative.   Cardiovascular: Negative for chest pain.  Gastrointestinal: Negative.  Negative for abdominal pain.  Genitourinary: Positive for dysuria, urgency and frequency. Negative for vaginal discharge.    Allergies  Review of patient's allergies indicates no known allergies.  Home Medications   Prior to Admission medications   Medication Sig Start Date End Date Taking? Authorizing Provider  cephALEXin (KEFLEX) 500 MG capsule Take 1 capsule (500 mg total) by mouth 4 (four) times daily. Take all of medicine and drink lots of fluids 10/31/14   Billy Fischer, MD  ibuprofen (ADVIL,MOTRIN) 200 MG tablet Take 400 mg by mouth every 6 (six) hours as needed (pain).    Historical Provider, MD  ibuprofen (ADVIL,MOTRIN) 800 MG tablet Take 1 tablet (800 mg total) by mouth every 8  (eight) hours as needed. 09/13/13   Evelina Bucy, MD  metroNIDAZOLE (METROGEL VAGINAL) 0.75 % vaginal gel Place 1 Applicatorful vaginally at bedtime. For 5 nights 10/31/14   Billy Fischer, MD  phenazopyridine (PYRIDIUM) 200 MG tablet Take 1 tablet (200 mg total) by mouth 3 (three) times daily as needed for pain. 08/04/14   Mercedes Strupp Camprubi-Soms, PA-C  sulfamethoxazole-trimethoprim (BACTRIM DS,SEPTRA DS) 800-160 MG per tablet Take 1 tablet by mouth 2 (two) times daily. 08/04/14   Mercedes Strupp Camprubi-Soms, PA-C   BP 124/70 mmHg  Pulse 78  Temp(Src) 98.6 F (37 C) (Oral)  Resp 18  SpO2 100%  LMP 10/21/2014 Physical Exam  Constitutional: She is oriented to person, place, and time. She appears well-developed and well-nourished. No distress.  Abdominal: Soft. Bowel sounds are normal. She exhibits no mass. There is no tenderness.  Neurological: She is alert and oriented to person, place, and time.  Skin: Skin is warm and dry.  Nursing note and vitals reviewed.   ED Course  Procedures (including critical care time) Labs Review Labs Reviewed  POCT URINALYSIS DIP (DEVICE) - Abnormal; Notable for the following:    Nitrite POSITIVE (*)    Leukocytes, UA SMALL (*)    All other components within normal limits  POCT PREGNANCY, URINE    Imaging Review No results found.   MDM   1. UTI (lower urinary tract infection)  2. BV (bacterial vaginosis)        Billy Fischer, MD 10/31/14 1329

## 2014-11-19 ENCOUNTER — Encounter (HOSPITAL_COMMUNITY): Payer: Self-pay | Admitting: Emergency Medicine

## 2014-11-19 ENCOUNTER — Emergency Department (HOSPITAL_COMMUNITY)
Admission: EM | Admit: 2014-11-19 | Discharge: 2014-11-19 | Disposition: A | Discharge status: Home / Self Care | Payer: Self-pay | Attending: Emergency Medicine | Admitting: Emergency Medicine

## 2014-11-19 DIAGNOSIS — Z720 Tobacco use: Secondary | ICD-10-CM | POA: Insufficient documentation

## 2014-11-19 DIAGNOSIS — N939 Abnormal uterine and vaginal bleeding, unspecified: Secondary | ICD-10-CM

## 2014-11-19 DIAGNOSIS — Z79899 Other long term (current) drug therapy: Secondary | ICD-10-CM | POA: Insufficient documentation

## 2014-11-19 DIAGNOSIS — Z792 Long term (current) use of antibiotics: Secondary | ICD-10-CM | POA: Insufficient documentation

## 2014-11-19 DIAGNOSIS — Z3202 Encounter for pregnancy test, result negative: Secondary | ICD-10-CM | POA: Insufficient documentation

## 2014-11-19 DIAGNOSIS — N938 Other specified abnormal uterine and vaginal bleeding: Secondary | ICD-10-CM | POA: Insufficient documentation

## 2014-11-19 DIAGNOSIS — N390 Urinary tract infection, site not specified: Secondary | ICD-10-CM | POA: Insufficient documentation

## 2014-11-19 LAB — COMPREHENSIVE METABOLIC PANEL
ALT: 24 U/L (ref 0–35)
ANION GAP: 8 (ref 5–15)
AST: 25 U/L (ref 0–37)
Albumin: 3.9 g/dL (ref 3.5–5.2)
Alkaline Phosphatase: 46 U/L (ref 39–117)
BILIRUBIN TOTAL: 0.7 mg/dL (ref 0.3–1.2)
BUN: 14 mg/dL (ref 6–23)
CALCIUM: 8.9 mg/dL (ref 8.4–10.5)
CHLORIDE: 106 mmol/L (ref 96–112)
CO2: 24 mmol/L (ref 19–32)
Creatinine, Ser: 0.68 mg/dL (ref 0.50–1.10)
GFR calc Af Amer: >90 mL/min (ref >90)
Glucose, Bld: 98 mg/dL (ref 70–99)
Potassium: 3.6 mmol/L (ref 3.5–5.1)
SODIUM: 138 mmol/L (ref 135–145)
TOTAL PROTEIN: 6.6 g/dL (ref 6.0–8.3)

## 2014-11-19 LAB — CBC WITH DIFFERENTIAL/PLATELET
BASOS PCT: 0 % (ref 0–1)
Basophils Absolute: 0 10*3/uL (ref 0.0–0.1)
EOS ABS: 0 10*3/uL (ref 0.0–0.7)
Eosinophils Relative: 1 % (ref 0–5)
HCT: 43 % (ref 36.0–46.0)
HEMOGLOBIN: 14.3 g/dL (ref 12.0–15.0)
Lymphocytes Relative: 11 % — ABNORMAL LOW (ref 12–46)
Lymphs Abs: 0.9 10*3/uL (ref 0.7–4.0)
MCH: 30.1 pg (ref 26.0–34.0)
MCHC: 33.3 g/dL (ref 30.0–36.0)
MCV: 90.5 fL (ref 78.0–100.0)
MONOS PCT: 3 % (ref 3–12)
Monocytes Absolute: 0.3 10*3/uL (ref 0.1–1.0)
NEUTROS ABS: 7.1 10*3/uL (ref 1.7–7.7)
Neutrophils Relative %: 85 % — ABNORMAL HIGH (ref 43–77)
PLATELETS: 152 10*3/uL (ref 150–400)
RBC: 4.75 MIL/uL (ref 3.87–5.11)
RDW: 12.8 % (ref 11.5–15.5)
WBC: 8.3 10*3/uL (ref 4.0–10.5)

## 2014-11-19 LAB — URINALYSIS, ROUTINE W REFLEX MICROSCOPIC
Bilirubin Urine: NEGATIVE
GLUCOSE, UA: NEGATIVE mg/dL
Ketones, ur: NEGATIVE mg/dL
Nitrite: POSITIVE — AB
PH: 7.5 (ref 5.0–8.0)
PROTEIN: NEGATIVE mg/dL
Specific Gravity, Urine: 1.023 (ref 1.005–1.030)
UROBILINOGEN UA: 1 mg/dL (ref 0.0–1.0)

## 2014-11-19 LAB — URINE MICROSCOPIC-ADD ON

## 2014-11-19 LAB — WET PREP, GENITAL
CLUE CELLS WET PREP: NONE SEEN
Trich, Wet Prep: NONE SEEN
YEAST WET PREP: NONE SEEN

## 2014-11-19 LAB — POC URINE PREG, ED: Preg Test, Ur: NEGATIVE

## 2014-11-19 MED ORDER — KETOROLAC TROMETHAMINE 30 MG/ML IJ SOLN: 30.0000 mg | Freq: Once | INTRAMUSCULAR | Status: DC

## 2014-11-19 MED ORDER — SODIUM CHLORIDE 0.9 % IV BOLUS (SEPSIS)
1000.0000 mL | INTRAVENOUS | Status: AC
  Administered 2014-11-19: 1000 mL via INTRAVENOUS

## 2014-11-19 MED ORDER — CIPROFLOXACIN HCL 500 MG PO TABS
500.0000 mg | ORAL_TABLET | Freq: Two times a day (BID) | ORAL | Status: DC
Start: 2014-11-19 — End: 2015-01-08

## 2014-11-19 MED ORDER — OXYCODONE-ACETAMINOPHEN 5-325 MG PO TABS: 1.0000 | ORAL_TABLET | Freq: Four times a day (QID) | ORAL | Status: DC | PRN

## 2014-11-19 MED ORDER — KETOROLAC TROMETHAMINE 30 MG/ML IJ SOLN
30.0000 mg | Freq: Once | INTRAMUSCULAR | Status: AC
  Administered 2014-11-19: 30 mg via INTRAVENOUS
  Filled 2014-11-19: qty 1

## 2014-11-19 NOTE — ED Provider Notes (Signed)
CSN: [id number]     Arrival date & time 11/19/14  1812 History   First MD Initiated Contact with Patient 11/19/14 1818     Chief Complaint  Patient presents with  . Abdominal Pain     (Consider location/radiation/quality/duration/timing/severity/associated sxs/prior Treatment) Patient is a 34 y.o. female presenting with abdominal pain. The history is provided by the patient.  Abdominal Pain Pain location:  Suprapubic Pain quality: cramping   Pain radiates to:  Does not radiate Pain severity:  Moderate Onset quality:  Gradual Timing:  Constant Progression:  Waxing and waning Chronicity:  New Context comment:  At rest Relieved by:  Nothing Worsened by:  Nothing tried Ineffective treatments:  None tried Associated symptoms: no chest pain, no cough, no diarrhea, no dysuria, no fatigue, no fever, no hematuria, no nausea, no shortness of breath and no vomiting     Past Medical History  Diagnosis Date  . UTI (lower urinary tract infection)   . BV (bacterial vaginosis)    Past Surgical History  Procedure Laterality Date  . Cesarean section     Family History  Problem Relation Age of Onset  . Heart disease Father    History  Substance Use Topics  . Smoking status: Current Some Day Smoker -- 0.50 packs/day for 10 years    Types: Cigarettes  . Smokeless tobacco: Never Used  . Alcohol Use: No     Comment: seldom   OB History    Gravida Para Term Preterm AB TAB SAB Ectopic Multiple Living   3 1 1  1 1    1      Review of Systems  Constitutional: Negative for fever and fatigue.  HENT: Negative for congestion and drooling.   Eyes: Negative for pain.  Respiratory: Negative for cough and shortness of breath.   Cardiovascular: Negative for chest pain.  Gastrointestinal: Negative for nausea, vomiting, abdominal pain and diarrhea.  Genitourinary: Positive for pelvic pain. Negative for dysuria and hematuria.  Musculoskeletal: Negative for back pain, gait problem and neck pain.   Skin: Negative for color change.  Neurological: Negative for dizziness and headaches.  Hematological: Negative for adenopathy.  Psychiatric/Behavioral: Negative for behavioral problems.  All other systems reviewed and are negative.     Allergies  Review of patient's allergies indicates no known allergies.  Home Medications   Prior to Admission medications   Medication Sig Start Date End Date Taking? Authorizing Provider  cephALEXin (KEFLEX) 500 MG capsule Take 1 capsule (500 mg total) by mouth 4 (four) times daily. Take all of medicine and drink lots of fluids 10/31/14   Billy Fischer, MD  ibuprofen (ADVIL,MOTRIN) 200 MG tablet Take 400 mg by mouth every 6 (six) hours as needed (pain).    Historical Provider, MD  ibuprofen (ADVIL,MOTRIN) 800 MG tablet Take 1 tablet (800 mg total) by mouth every 8 (eight) hours as needed. 09/13/13   Evelina Bucy, MD  metroNIDAZOLE (METROGEL VAGINAL) 0.75 % vaginal gel Place 1 Applicatorful vaginally at bedtime. For 5 nights 10/31/14   Billy Fischer, MD  phenazopyridine (PYRIDIUM) 200 MG tablet Take 1 tablet (200 mg total) by mouth 3 (three) times daily as needed for pain. 08/04/14   Mercedes Strupp Camprubi-Soms, PA-C  sulfamethoxazole-trimethoprim (BACTRIM DS,SEPTRA DS) 800-160 MG per tablet Take 1 tablet by mouth 2 (two) times daily. 08/04/14   Mercedes Strupp Camprubi-Soms, PA-C   BP 141/74 mmHg  Pulse 101  Temp(Src) 98 F (36.7 C) (Oral)  Resp 16  SpO2 99%  LMP  10/21/2014 Physical Exam  Constitutional: She is oriented to person, place, and time. She appears well-developed and well-nourished.  HENT:  Head: Normocephalic.  Mouth/Throat: Oropharynx is clear and moist. No oropharyngeal exudate.  Eyes: Conjunctivae and EOM are normal. Pupils are equal, round, and reactive to light.  Neck: Normal range of motion. Neck supple.  Cardiovascular: Normal rate, regular rhythm, normal heart sounds and intact distal pulses.  Exam reveals no gallop and no  friction rub.   No murmur heard. Pulmonary/Chest: Effort normal and breath sounds normal. No respiratory distress. She has no wheezes.  Abdominal: Soft. Bowel sounds are normal. There is tenderness (mild suprapubic tenderness.). There is no rebound and no guarding.  Genitourinary:  Normal-appearing external vagina. Normal appearance of cervix. Os closed. Small to moderate amount of dark blood clots in the posterior fornix. No cervical motion tenderness. Mild central tenderness during bimanual exam. No adnexal tenderness.  Musculoskeletal: Normal range of motion. She exhibits no edema or tenderness.  Neurological: She is alert and oriented to person, place, and time.  Skin: Skin is warm and dry.  Psychiatric: She has a normal mood and affect. Her behavior is normal.  Nursing note and vitals reviewed.   ED Course  Procedures (including critical care time) Labs Review Labs Reviewed  WET PREP, GENITAL - Abnormal; Notable for the following:    WBC, Wet Prep HPF POC FEW (*)    All other components within normal limits  CBC WITH DIFFERENTIAL/PLATELET - Abnormal; Notable for the following:    Neutrophils Relative % 85 (*)    Lymphocytes Relative 11 (*)    All other components within normal limits  URINALYSIS, ROUTINE W REFLEX MICROSCOPIC - Abnormal; Notable for the following:    APPearance CLOUDY (*)    Hgb urine dipstick LARGE (*)    Nitrite POSITIVE (*)    Leukocytes, UA TRACE (*)    All other components within normal limits  URINE MICROSCOPIC-ADD ON - Abnormal; Notable for the following:    Bacteria, UA MANY (*)    All other components within normal limits  URINE CULTURE  COMPREHENSIVE METABOLIC PANEL  POC URINE PREG, ED  GC/CHLAMYDIA PROBE AMP ([place])    Imaging Review No results found.   EKG Interpretation None      MDM   Final diagnoses:  UTI (lower urinary tract infection)  Vaginal bleeding    6:59 PM 34 y.o. female who presents with abdominal cramping which  began earlier today at work. She notes that she try to have a bowel movement at home and believes that she passed some tissue from her vagina. She has had some mild vaginal spotting since that time. She denies any fevers, vomiting, or diarrhea. She is mildly tachycardic but vital signs otherwise unremarkable here. Pregnancy is negative. Pelvic exam shows some dark blood clots which she probably thought was tissue. We'll get screening labs and pain control with Toradol.  8:38 PM: UA c/w UTI. Pt cont to appear well. Abd benign. I have discussed the diagnosis/risks/treatment options with the patient and believe the pt to be eligible for discharge home to follow-up with her pcp as needed. We also discussed returning to the ED immediately if new or worsening sx occur. We discussed the sx which are most concerning (e.g., worsening pain, fever, vomiting) that necessitate immediate return. Medications administered to the patient during their visit and any new prescriptions provided to the patient are listed below.  Medications given during this visit Medications  sodium chloride 0.9 %  bolus 1,000 mL (1,000 mLs Intravenous New Bag/Given 11/19/14 1902)  ketorolac (TORADOL) 30 MG/ML injection 30 mg (30 mg Intravenous Given 11/19/14 1909)    New Prescriptions   CIPROFLOXACIN (CIPRO) 500 MG TABLET    Take 1 tablet (500 mg total) by mouth 2 (two) times daily. One po bid x 7 days   OXYCODONE-ACETAMINOPHEN (PERCOCET) 5-325 MG PER TABLET    Take 1 tablet by mouth every 6 (six) hours as needed for moderate pain.     Pamella Pert, MD 11/19/14 2039

## 2014-11-19 NOTE — ED Notes (Signed)
Delay on lab draw, edp performing pelvic exam

## 2014-11-19 NOTE — ED Notes (Signed)
Pt c/o lower abdominal pain x 2 days with a thick white discharge. Pt now having intense cramping with bleeding and passed "tissue" earlier today. Pt concerned she had a miscarriage.

## 2014-11-20 LAB — GC/CHLAMYDIA PROBE AMP (~~LOC~~) NOT AT ARMC
Chlamydia: NEGATIVE
Neisseria Gonorrhea: NEGATIVE

## 2014-11-22 LAB — URINE CULTURE
Colony Count: >=100000
Special Requests: NORMAL

## 2014-11-23 ENCOUNTER — Telehealth (HOSPITAL_COMMUNITY): Payer: Self-pay

## 2014-11-23 NOTE — ED Notes (Signed)
Post ED Visit - Positive Culture Follow-up  Culture report reviewed by antimicrobial stewardship pharmacist: []  Wes [place], Pharm.D., BCPS [x]  Heide Guile, Pharm.D., BCPS []  Alycia Rossetti, Pharm.D., BCPS []  [place], [place].D., BCPS, AAHIVP []  Legrand Como, Pharm.D., BCPS, AAHIVP []  Isac Sarna, Pharm.D., BCPS  Positive urine culture Treated with cipro, organism sensitive to the same and no further patient follow-up is required at this time.  Ileene Musa 11/23/2014, 11:14 AM

## 2015-01-08 ENCOUNTER — Encounter (HOSPITAL_COMMUNITY): Payer: Self-pay

## 2015-01-08 ENCOUNTER — Emergency Department (INDEPENDENT_AMBULATORY_CARE_PROVIDER_SITE_OTHER)
Admission: EM | Admit: 2015-01-08 | Discharge: 2015-01-08 | Disposition: A | Discharge status: Home / Self Care | Payer: Self-pay | Source: Home / Self Care | Attending: Emergency Medicine | Admitting: Emergency Medicine

## 2015-01-08 DIAGNOSIS — K0889 Other specified disorders of teeth and supporting structures: Secondary | ICD-10-CM

## 2015-01-08 DIAGNOSIS — K088 Other specified disorders of teeth and supporting structures: Secondary | ICD-10-CM

## 2015-01-08 MED ORDER — AMOXICILLIN 500 MG PO CAPS: 500.0000 mg | ORAL_CAPSULE | Freq: Three times a day (TID) | ORAL | Status: DC

## 2015-01-08 NOTE — Discharge Instructions (Signed)
Dental Pain °A tooth ache may be caused by cavities (tooth decay). Cavities expose the nerve of the tooth to air and hot or cold temperatures. It may come from an infection or abscess (also called a boil or furuncle) around your tooth. It is also often caused by dental caries (tooth decay). This causes the pain you are having. °DIAGNOSIS  °Your caregiver can diagnose this problem by exam. °TREATMENT  °· If caused by an infection, it may be treated with medications which kill germs (antibiotics) and pain medications as prescribed by your caregiver. Take medications as directed. °· Only take over-the-counter or prescription medicines for pain, discomfort, or fever as directed by your caregiver. °· Whether the tooth ache today is caused by infection or dental disease, you should see your dentist as soon as possible for further care. °SEEK MEDICAL CARE IF: °The exam and treatment you received today has been provided on an emergency basis only. This is not a substitute for complete medical or dental care. If your problem worsens or new problems (symptoms) appear, and you are unable to meet with your dentist, call or return to this location. °SEEK IMMEDIATE MEDICAL CARE IF:  °· You have a fever. °· You develop redness and swelling of your face, jaw, or neck. °· You are unable to open your mouth. °· You have severe pain uncontrolled by pain medicine. °MAKE SURE YOU:  °· Understand these instructions. °· Will watch your condition. °· Will get help right away if you are not doing well or get worse. °Document Released: 09/15/2005 Document Revised: 12/08/2011 Document Reviewed: 05/03/2008 °ExitCare® Patient Information ©2015 [employer]. This information is not intended to replace advice given to you by your health care provider. Make sure you discuss any questions you have with your health care provider. ° °Dental Care and Dentist Visits °Dental care supports good overall health. Regular dental visits can also help you  avoid dental pain, bleeding, infection, and other more serious health problems in the future. It is important to keep the mouth healthy because diseases in the teeth, gums, and other oral tissues can spread to other areas of the body. Some problems, such as diabetes, heart disease, and pre-term labor have been associated with poor oral health.  °See your dentist every 6 months. If you experience emergency problems such as a toothache or broken tooth, go to the dentist right away. If you see your dentist regularly, you may catch problems early. It is easier to be treated for problems in the early stages.  °WHAT TO EXPECT AT A DENTIST VISIT  °Your dentist will look for many common oral health problems and recommend proper treatment. At your regular dental visit, you can expect: °· Gentle cleaning of the teeth and gums. This includes scraping and polishing. This helps to remove the sticky substance around the teeth and gums (plaque). Plaque forms in the mouth shortly after eating. Over time, plaque hardens on the teeth as tartar. If tartar is not removed regularly, it can cause problems. Cleaning also helps remove stains. °· Periodic X-rays. These pictures of the teeth and supporting bone will help your dentist assess the health of your teeth. °· Periodic fluoride treatments. Fluoride is a natural mineral shown to help strengthen teeth. Fluoride treatment involves applying a fluoride gel or varnish to the teeth. It is most commonly done in children. °· Examination of the mouth, tongue, jaws, teeth, and gums to look for any oral health problems, such as: °¨ Cavities (dental caries). This is   decay on the tooth caused by plaque, sugar, and acid in the mouth. It is best to catch a cavity when it is small. °¨ Inflammation of the gums caused by plaque buildup (gingivitis). °¨ Problems with the mouth or malformed or misaligned teeth. °¨ Oral cancer or other diseases of the soft tissues or jaws.  °KEEP YOUR TEETH AND GUMS  HEALTHY °For healthy teeth and gums, follow these general guidelines as well as your dentist's specific advice: °· Have your teeth professionally cleaned at the dentist every 6 months. °· Brush twice daily with a fluoride toothpaste. °· Floss your teeth daily.  °· Ask your dentist if you need fluoride supplements, treatments, or fluoride toothpaste. °· Eat a healthy diet. Reduce foods and drinks with added sugar. °· Avoid smoking. °TREATMENT FOR ORAL HEALTH PROBLEMS °If you have oral health problems, treatment varies depending on the conditions present in your teeth and gums. °· Your caregiver will most likely recommend good oral hygiene at each visit. °· For cavities, gingivitis, or other oral health disease, your caregiver will perform a procedure to treat the problem. This is typically done at a separate appointment. Sometimes your caregiver will refer you to another dental specialist for specific tooth problems or for surgery. °SEEK IMMEDIATE DENTAL CARE IF: °· You have pain, bleeding, or soreness in the gum, tooth, jaw, or mouth area. °· A permanent tooth becomes loose or separated from the gum socket. °· You experience a blow or injury to the mouth or jaw area. °Document Released: 05/28/2011 Document Revised: 12/08/2011 Document Reviewed: 05/28/2011 °ExitCare® Patient Information ©2015 [employer]. This information is not intended to replace advice given to you by your health care provider. Make sure you discuss any questions you have with your health care provider. ° °

## 2015-01-08 NOTE — ED Provider Notes (Signed)
CSN: [id number]     Arrival date & time 01/08/15  1039 History   First MD Initiated Contact with Patient 01/08/15 1125     Chief Complaint  Patient presents with  . Dental Pain   (Consider location/radiation/quality/duration/timing/severity/associated sxs/prior Treatment) HPI Comments: 34 year old female complaining of left lower jaw tooth pain. She believes that one of her third molars is causing her pain.  Patient is a 34 y.o. female presenting with tooth pain.  Dental Pain   Past Medical History  Diagnosis Date  . UTI (lower urinary tract infection)   . BV (bacterial vaginosis)    Past Surgical History  Procedure Laterality Date  . Cesarean section     Family History  Problem Relation Age of Onset  . Heart disease Father    History  Substance Use Topics  . Smoking status: Current Some Day Smoker -- 0.50 packs/day for 10 years    Types: Cigarettes  . Smokeless tobacco: Never Used  . Alcohol Use: No     Comment: seldom   OB History    Gravida Para Term Preterm AB TAB SAB Ectopic Multiple Living   3 1 1  1 1    1      Review of Systems  HENT: Positive for dental problem.   All other systems reviewed and are negative.   Allergies  Review of patient's allergies indicates no known allergies.  Home Medications   Prior to Admission medications   Medication Sig Start Date End Date Taking? Authorizing Provider  amoxicillin (AMOXIL) 500 MG capsule Take 1 capsule (500 mg total) by mouth 3 (three) times daily. 01/08/15   Janne Napoleon, NP  aspirin 81 MG tablet Take 162 mg by mouth daily as needed for fever (fever).    Historical Provider, MD  ibuprofen (ADVIL,MOTRIN) 200 MG tablet Take 400 mg by mouth every 6 (six) hours as needed (pain).    Historical Provider, MD  ibuprofen (ADVIL,MOTRIN) 800 MG tablet Take 1 tablet (800 mg total) by mouth every 8 (eight) hours as needed. Patient not taking: Reported on 11/19/2014 09/13/13   Evelina Bucy, MD   BP 116/75 mmHg  Pulse 83   Temp(Src) 99.2 F (37.3 C) (Oral)  Resp 16  SpO2 100% Physical Exam  Constitutional: She is oriented to person, place, and time. She appears well-developed and well-nourished. No distress.  HENT:  Mouth/Throat: Oropharynx is clear and moist.  Left third molar with mild gingival hypertrophy and slight erythema is tender. No abscess formation seen.  Neck: Normal range of motion. Neck supple.  Lymphadenopathy:    She has no cervical adenopathy.  Neurological: She is alert and oriented to person, place, and time.  Skin: Skin is warm and dry.  Nursing note and vitals reviewed.   ED Course  Procedures (including critical care time) Labs Review Labs Reviewed - No data to display  Imaging Review No results found.   MDM   1. Pain, dental    Amoxil Motrin and APAP    Janne Napoleon, NP 01/08/15 1240

## 2015-01-08 NOTE — ED Notes (Signed)
Concern for ?infected wisdom tooth

## 2015-02-18 ENCOUNTER — Emergency Department (HOSPITAL_COMMUNITY)
Admission: EM | Admit: 2015-02-18 | Discharge: 2015-02-18 | Disposition: A | Discharge status: Home / Self Care | Payer: Self-pay | Attending: Emergency Medicine | Admitting: Emergency Medicine

## 2015-02-18 ENCOUNTER — Encounter (HOSPITAL_COMMUNITY): Payer: Self-pay | Admitting: Physical Medicine and Rehabilitation

## 2015-02-18 DIAGNOSIS — Z3202 Encounter for pregnancy test, result negative: Secondary | ICD-10-CM | POA: Insufficient documentation

## 2015-02-18 DIAGNOSIS — Z720 Tobacco use: Secondary | ICD-10-CM | POA: Insufficient documentation

## 2015-02-18 DIAGNOSIS — Z792 Long term (current) use of antibiotics: Secondary | ICD-10-CM | POA: Insufficient documentation

## 2015-02-18 DIAGNOSIS — N390 Urinary tract infection, site not specified: Secondary | ICD-10-CM | POA: Insufficient documentation

## 2015-02-18 DIAGNOSIS — Z8742 Personal history of other diseases of the female genital tract: Secondary | ICD-10-CM | POA: Insufficient documentation

## 2015-02-18 DIAGNOSIS — R300 Dysuria: Secondary | ICD-10-CM

## 2015-02-18 DIAGNOSIS — Z7982 Long term (current) use of aspirin: Secondary | ICD-10-CM | POA: Insufficient documentation

## 2015-02-18 LAB — URINALYSIS, ROUTINE W REFLEX MICROSCOPIC
BILIRUBIN URINE: NEGATIVE
Glucose, UA: NEGATIVE mg/dL
Hgb urine dipstick: NEGATIVE
KETONES UR: NEGATIVE mg/dL
NITRITE: NEGATIVE
Protein, ur: NEGATIVE mg/dL
Specific Gravity, Urine: 1.021 (ref 1.005–1.030)
UROBILINOGEN UA: 1 mg/dL (ref 0.0–1.0)
pH: 7.5 (ref 5.0–8.0)

## 2015-02-18 LAB — URINE MICROSCOPIC-ADD ON

## 2015-02-18 LAB — POC URINE PREG, ED: Preg Test, Ur: NEGATIVE

## 2015-02-18 MED ORDER — CEPHALEXIN 250 MG PO CAPS
500.0000 mg | ORAL_CAPSULE | Freq: Once | ORAL | Status: AC
  Administered 2015-02-18: 500 mg via ORAL
  Filled 2015-02-18: qty 2

## 2015-02-18 MED ORDER — PHENAZOPYRIDINE HCL 100 MG PO TABS
200.0000 mg | ORAL_TABLET | Freq: Once | ORAL | Status: AC
  Administered 2015-02-18: 200 mg via ORAL
  Filled 2015-02-18: qty 2

## 2015-02-18 MED ORDER — CEPHALEXIN 500 MG PO CAPS: 500.0000 mg | ORAL_CAPSULE | Freq: Four times a day (QID) | ORAL | Status: DC

## 2015-02-18 NOTE — ED Notes (Signed)
Pt expressing concern regarding recent pregnancy tests, states she's had several positive OTC tests and several negative ones.

## 2015-02-18 NOTE — Discharge Instructions (Signed)
It was our pleasure to provide your ER care today - we hope that you feel better.  Drink plenty of fluids.   Take keflex (antibiotic) as prescribed.  Follow up with primary care doctor in coming week if symptoms fail to improve/resolve.  Return to ER if worse, new symptoms,  fevers, persistent vomiting, abdominal pain, other concern.      Urinary Tract Infection Urinary tract infections (UTIs) can develop anywhere along your urinary tract. Your urinary tract is your body's drainage system for removing wastes and extra water. Your urinary tract includes two kidneys, two ureters, a bladder, and a urethra. Your kidneys are a pair of bean-shaped organs. Each kidney is about the size of your fist. They are located below your ribs, one on each side of your spine. CAUSES Infections are caused by microbes, which are microscopic organisms, including fungi, viruses, and bacteria. These organisms are so small that they can only be seen through a microscope. Bacteria are the microbes that most commonly cause UTIs. SYMPTOMS  Symptoms of UTIs may vary by age and gender of the patient and by the location of the infection. Symptoms in young women typically include a frequent and intense urge to urinate and a painful, burning feeling in the bladder or urethra during urination. Older women and men are more likely to be tired, shaky, and weak and have muscle aches and abdominal pain. A fever may mean the infection is in your kidneys. Other symptoms of a kidney infection include pain in your back or sides below the ribs, nausea, and vomiting. DIAGNOSIS To diagnose a UTI, your caregiver will ask you about your symptoms. Your caregiver also will ask to provide a urine sample. The urine sample will be tested for bacteria and white blood cells. White blood cells are made by your body to help fight infection. TREATMENT  Typically, UTIs can be treated with medication. Because most UTIs are caused by a bacterial  infection, they usually can be treated with the use of antibiotics. The choice of antibiotic and length of treatment depend on your symptoms and the type of bacteria causing your infection. HOME CARE INSTRUCTIONS  If you were prescribed antibiotics, take them exactly as your caregiver instructs you. Finish the medication even if you feel better after you have only taken some of the medication.  Drink enough water and fluids to keep your urine clear or pale yellow.  Avoid caffeine, tea, and carbonated beverages. They tend to irritate your bladder.  Empty your bladder often. Avoid holding urine for long periods of time.  Empty your bladder before and after sexual intercourse.  After a bowel movement, women should cleanse from front to back. Use each tissue only once. SEEK MEDICAL CARE IF:   You have back pain.  You develop a fever.  Your symptoms do not begin to resolve within 3 days. SEEK IMMEDIATE MEDICAL CARE IF:   You have severe back pain or lower abdominal pain.  You develop chills.  You have nausea or vomiting.  You have continued burning or discomfort with urination. MAKE SURE YOU:   Understand these instructions.  Will watch your condition.  Will get help right away if you are not doing well or get worse. Document Released: 06/25/2005 Document Revised: 03/16/2012 Document Reviewed: 10/24/2011 [facility] Patient Information 2015 [place], [place]. This information is not intended to replace advice given to you by your health care provider. Make sure you discuss any questions you have with your health care provider.

## 2015-02-18 NOTE — ED Notes (Signed)
Pt presents to department for evaluation of dysuria, lower back pain and urinary frequency. Ongoing x2 days. Pt is alert and oriented x4.

## 2015-02-18 NOTE — ED Provider Notes (Signed)
CSN: [id number]     Arrival date & time 02/18/15  1844 History   First MD Initiated Contact with Patient 02/18/15 1932     Chief Complaint  Patient presents with  . Urinary Frequency  . Dysuria     (Consider location/radiation/quality/duration/timing/severity/associated sxs/prior Treatment) Patient is a 34 y.o. female presenting with frequency and dysuria. The history is provided by the patient.  Urinary Frequency Pertinent negatives include no abdominal pain.  Dysuria Associated symptoms: no abdominal pain, no fever, no nausea, no vaginal discharge and no vomiting   Patient w hx uncomplicated uti, c/o urinary urgency, dysuria for the past 1-2 days. Episodic, persistent. No abd or flank pain. No nv. Normal appetite. No fever, chills or sweats. Feels well, not ill or sick. Denies vaginal bleeding or discharge. No hx diabetes.      Past Medical History  Diagnosis Date  . UTI (lower urinary tract infection)   . BV (bacterial vaginosis)    Past Surgical History  Procedure Laterality Date  . Cesarean section     Family History  Problem Relation Age of Onset  . Heart disease Father    History  Substance Use Topics  . Smoking status: Current Some Day Smoker -- 0.50 packs/day for 10 years    Types: Cigarettes  . Smokeless tobacco: Never Used  . Alcohol Use: No     Comment: seldom   OB History    Gravida Para Term Preterm AB TAB SAB Ectopic Multiple Living   3 1 1  1 1    1      Review of Systems  Constitutional: Negative for fever and chills.  Gastrointestinal: Negative for nausea, vomiting and abdominal pain.  Genitourinary: Positive for dysuria and frequency. Negative for vaginal bleeding and vaginal discharge.      Allergies  Review of patient's allergies indicates no known allergies.  Home Medications   Prior to Admission medications   Medication Sig Start Date End Date Taking? Authorizing Provider  amoxicillin (AMOXIL) 500 MG capsule Take 1 capsule (500 mg  total) by mouth 3 (three) times daily. 01/08/15   Janne Napoleon, NP  aspirin 81 MG tablet Take 162 mg by mouth daily as needed for fever (fever).    Historical Provider, MD  ibuprofen (ADVIL,MOTRIN) 200 MG tablet Take 400 mg by mouth every 6 (six) hours as needed (pain).    Historical Provider, MD  ibuprofen (ADVIL,MOTRIN) 800 MG tablet Take 1 tablet (800 mg total) by mouth every 8 (eight) hours as needed. Patient not taking: Reported on 11/19/2014 09/13/13   Evelina Bucy, MD   BP 119/52 mmHg  Pulse 97  Temp(Src) 98.4 F (36.9 C) (Oral)  Resp 18  Wt 171 lb 1.6 oz (77.61 kg)  SpO2 98% Physical Exam  Constitutional: She appears well-developed and well-nourished. No distress.  Eyes: Conjunctivae are normal. No scleral icterus.  Neck: Neck supple. No tracheal deviation present.  Cardiovascular: Normal rate.   Pulmonary/Chest: Effort normal. No respiratory distress.  Abdominal: Soft. Normal appearance and bowel sounds are normal. She exhibits no distension and no mass. There is no tenderness. There is no rebound and no guarding.  Genitourinary:  No cva tenderness  Musculoskeletal: She exhibits no edema.  Neurological: She is alert.  Skin: Skin is warm and dry. No rash noted. She is not diaphoretic.  Psychiatric: She has a normal mood and affect.  Nursing note and vitals reviewed.   ED Course  Procedures (including critical care time) Labs Review  Results for orders  placed or performed during the hospital encounter of 02/18/15  Urinalysis, Routine w reflex microscopic  Result Value Ref Range   Color, Urine YELLOW YELLOW   APPearance CLOUDY (A) CLEAR   Specific Gravity, Urine 1.021 1.005 - 1.030   pH 7.5 5.0 - 8.0   Glucose, UA NEGATIVE NEGATIVE mg/dL   Hgb urine dipstick NEGATIVE NEGATIVE   Bilirubin Urine NEGATIVE NEGATIVE   Ketones, ur NEGATIVE NEGATIVE mg/dL   Protein, ur NEGATIVE NEGATIVE mg/dL   Urobilinogen, UA 1.0 0.0 - 1.0 mg/dL   Nitrite NEGATIVE NEGATIVE   Leukocytes, UA  SMALL (A) NEGATIVE  Urine microscopic-add on  Result Value Ref Range   Squamous Epithelial / LPF MANY (A) RARE   WBC, UA 3-6 <3 WBC/hpf   Urine-Other AMORPHOUS URATES/PHOSPHATES   POC Urine Pregnancy, ED (do NOT order at [facility])  Result Value Ref Range   Preg Test, Ur NEGATIVE NEGATIVE       MDM   Labs.  Reviewed nursing notes and prior charts for additional history.   Possible uti w cloudy urine, LE pos, and few wbc - will cx and rx.  Recheck abd soft nt. Afeb. No nv.  Pt currently appears stable for d/c.     Lajean Saver, MD 02/18/15 2053

## 2015-04-06 ENCOUNTER — Encounter (HOSPITAL_COMMUNITY): Payer: Self-pay | Admitting: Emergency Medicine

## 2015-04-06 ENCOUNTER — Emergency Department (HOSPITAL_COMMUNITY)
Admission: EM | Admit: 2015-04-06 | Discharge: 2015-04-06 | Disposition: A | Discharge status: Home / Self Care | Payer: Self-pay | Attending: Emergency Medicine | Admitting: Emergency Medicine

## 2015-04-06 DIAGNOSIS — R350 Frequency of micturition: Secondary | ICD-10-CM | POA: Insufficient documentation

## 2015-04-06 DIAGNOSIS — R5383 Other fatigue: Secondary | ICD-10-CM | POA: Insufficient documentation

## 2015-04-06 DIAGNOSIS — M545 Low back pain: Secondary | ICD-10-CM | POA: Insufficient documentation

## 2015-04-06 DIAGNOSIS — Z87440 Personal history of urinary (tract) infections: Secondary | ICD-10-CM | POA: Insufficient documentation

## 2015-04-06 DIAGNOSIS — Z3202 Encounter for pregnancy test, result negative: Secondary | ICD-10-CM | POA: Insufficient documentation

## 2015-04-06 DIAGNOSIS — Z720 Tobacco use: Secondary | ICD-10-CM | POA: Insufficient documentation

## 2015-04-06 DIAGNOSIS — Z8619 Personal history of other infectious and parasitic diseases: Secondary | ICD-10-CM | POA: Insufficient documentation

## 2015-04-06 LAB — URINALYSIS, ROUTINE W REFLEX MICROSCOPIC
Bilirubin Urine: NEGATIVE
GLUCOSE, UA: NEGATIVE mg/dL
Hgb urine dipstick: NEGATIVE
Ketones, ur: NEGATIVE mg/dL
LEUKOCYTES UA: NEGATIVE
Nitrite: NEGATIVE
PH: 7 (ref 5.0–8.0)
PROTEIN: NEGATIVE mg/dL
SPECIFIC GRAVITY, URINE: 1.025 (ref 1.005–1.030)
Urobilinogen, UA: 0.2 mg/dL (ref 0.0–1.0)

## 2015-04-06 LAB — CBC WITH DIFFERENTIAL/PLATELET
Basophils Absolute: 0 10*3/uL (ref 0.0–0.1)
Basophils Relative: 1 % (ref 0–1)
Eosinophils Absolute: 0.1 10*3/uL (ref 0.0–0.7)
Eosinophils Relative: 1 % (ref 0–5)
HCT: 42.1 % (ref 36.0–46.0)
Hemoglobin: 13.9 g/dL (ref 12.0–15.0)
Lymphocytes Relative: 23 % (ref 12–46)
Lymphs Abs: 1.8 10*3/uL (ref 0.7–4.0)
MCH: 29.6 pg (ref 26.0–34.0)
MCHC: 33 g/dL (ref 30.0–36.0)
MCV: 89.8 fL (ref 78.0–100.0)
Monocytes Absolute: 1 10*3/uL (ref 0.1–1.0)
Monocytes Relative: 13 % — ABNORMAL HIGH (ref 3–12)
Neutro Abs: 4.8 10*3/uL (ref 1.7–7.7)
Neutrophils Relative %: 62 % (ref 43–77)
Platelets: 164 10*3/uL (ref 150–400)
RBC: 4.69 MIL/uL (ref 3.87–5.11)
RDW: 12.8 % (ref 11.5–15.5)
WBC: 7.7 10*3/uL (ref 4.0–10.5)

## 2015-04-06 LAB — BASIC METABOLIC PANEL
Anion gap: 7 (ref 5–15)
BUN: 11 mg/dL (ref 6–20)
CO2: 26 mmol/L (ref 22–32)
Calcium: 9.3 mg/dL (ref 8.9–10.3)
Chloride: 106 mmol/L (ref 101–111)
Creatinine, Ser: 0.71 mg/dL (ref 0.44–1.00)
GFR calc Af Amer: >60 mL/min (ref >60)
GFR calc non Af Amer: >60 mL/min (ref >60)
Glucose, Bld: 105 mg/dL — ABNORMAL HIGH (ref 65–99)
Potassium: 3.8 mmol/L (ref 3.5–5.1)
Sodium: 139 mmol/L (ref 135–145)

## 2015-04-06 LAB — PREGNANCY, URINE: Preg Test, Ur: NEGATIVE

## 2015-04-06 MED ORDER — CIPROFLOXACIN HCL 250 MG PO TABS: 250.0000 mg | ORAL_TABLET | Freq: Two times a day (BID) | ORAL | Status: DC

## 2015-04-06 MED ORDER — VALACYCLOVIR HCL 1 G PO TABS: 2000.0000 mg | ORAL_TABLET | Freq: Two times a day (BID) | ORAL | Status: DC

## 2015-04-06 MED ORDER — SODIUM CHLORIDE 0.9 % IV BOLUS (SEPSIS): 1000.0000 mL | Freq: Once | INTRAVENOUS | Status: DC

## 2015-04-06 NOTE — ED Notes (Signed)
Patient says, "I think I have a bladder infection. I'm starting to have bilateral flank pain more so on the right." Denies dysuria but has had urinary frequency. Increased stress and fatigue. Has recurrent bladder infections. Denies abdominal pain/emesis/diarrhea but does endorse slight nausea. Denies hematuria. No other c/c.

## 2015-04-06 NOTE — Discharge Instructions (Signed)
Return here as needed.  Take Tylenol and Motrin for any pain.  Her laboratory testing did not show any significant abnormalities.  Follow-up with a primary care doctor

## 2015-04-06 NOTE — ED Provider Notes (Signed)
CSN: [id number]     Arrival date & time 04/06/15  1924 History   First MD Initiated Contact with Patient 04/06/15 1954     Chief Complaint  Patient presents with  . Urinary Frequency  . Fatigue     (Consider location/radiation/quality/duration/timing/severity/associated sxs/prior Treatment) HPI Patient presents to the emergency department with urinary frequency over the last week.  Patient states that she has had a little bit of low back pain as well, maybe some mild nausea.  Patient denies chest pain, shortness of breath, headache, blurred vision, weakness, dizziness,neck pain, fever, rash, lightheadedness or syncope.  The patient states that she feels like she has a urinary tract infection.  The patient states that nothing seems make her condition better or worse.  Patient states she did not take any medications prior to arrival for her symptoms Past Medical History  Diagnosis Date  . UTI (lower urinary tract infection)   . BV (bacterial vaginosis)    Past Surgical History  Procedure Laterality Date  . Cesarean section     Family History  Problem Relation Age of Onset  . Heart disease Father    History  Substance Use Topics  . Smoking status: Current Some Day Smoker -- 0.50 packs/day for 10 years    Types: Cigarettes  . Smokeless tobacco: Never Used  . Alcohol Use: No     Comment: seldom   OB History    Gravida Para Term Preterm AB TAB SAB Ectopic Multiple Living   3 1 1  1 1    1      Review of Systems  All other systems negative except as documented in the HPI. All pertinent positives and negatives as reviewed in the HPI.  Allergies  Review of patient's allergies indicates no known allergies.  Home Medications   Prior to Admission medications   Medication Sig Start Date End Date Taking? Authorizing Provider  amoxicillin (AMOXIL) 500 MG capsule Take 1 capsule (500 mg total) by mouth 3 (three) times daily. Patient not taking: Reported on 02/18/2015 01/08/15   Janne Napoleon, NP  cephALEXin (KEFLEX) 500 MG capsule Take 1 capsule (500 mg total) by mouth 4 (four) times daily. Patient not taking: Reported on 04/06/2015 02/18/15   Lajean Saver, MD  ibuprofen (ADVIL,MOTRIN) 800 MG tablet Take 1 tablet (800 mg total) by mouth every 8 (eight) hours as needed. Patient not taking: Reported on 11/19/2014 09/13/13   Evelina Bucy, MD   BP 122/60 mmHg  Pulse 78  Temp(Src) 98.3 F (36.8 C) (Oral)  Resp 16  SpO2 99%  LMP 03/25/2015 (Approximate) Physical Exam  Constitutional: She is oriented to person, place, and time. She appears well-developed and well-nourished. No distress.  HENT:  Head: Normocephalic and atraumatic.  Mouth/Throat: Oropharynx is clear and moist.  Eyes: Pupils are equal, round, and reactive to light.  Neck: Normal range of motion. Neck supple.  Cardiovascular: Normal rate, regular rhythm and normal heart sounds.  Exam reveals no gallop and no friction rub.   No murmur heard. Pulmonary/Chest: Effort normal and breath sounds normal. No respiratory distress.  Abdominal: Soft. Bowel sounds are normal. She exhibits no distension. There is no tenderness.  Musculoskeletal: She exhibits no edema.  Neurological: She is alert and oriented to person, place, and time. She exhibits normal muscle tone. Coordination normal.  Skin: Skin is warm and dry. No rash noted. No erythema.  Nursing note and vitals reviewed.   ED Course  Procedures (including critical care time) Labs Review Labs  Reviewed  BASIC METABOLIC PANEL - Abnormal; Notable for the following:    Glucose, Bld 105 (*)    All other components within normal limits  CBC WITH DIFFERENTIAL/PLATELET - Abnormal; Notable for the following:    Monocytes Relative 13 (*)    All other components within normal limits  URINALYSIS, ROUTINE W REFLEX MICROSCOPIC (NOT AT [facility])  PREGNANCY, URINE     Patient will be advised follow-up with her primary care Dr. told to return here as needed.  She is advised to  increase her fluid intake, rest as much as possible.  Patient has normal laboratory testing, and vital signs.  There is no clear-cut cause for her symptoms.  We will give her a 3 day course of Anna biotics  Dalia Heading, Hershal Coria 04/06/15 2208  Lacretia Leigh, MD 04/06/15 [phone]

## 2015-04-06 NOTE — ED Notes (Signed)
Questions r/t dc were denied. Pt ambulatory and a&ox4 

## 2015-06-24 ENCOUNTER — Other Ambulatory Visit (HOSPITAL_COMMUNITY)
Admission: RE | Admit: 2015-06-24 | Discharge: 2015-06-24 | Disposition: A | Discharge status: Home / Self Care | Payer: Self-pay | Source: Ambulatory Visit | Attending: Family Medicine | Admitting: Family Medicine

## 2015-06-24 ENCOUNTER — Encounter (HOSPITAL_COMMUNITY): Payer: Self-pay | Admitting: Emergency Medicine

## 2015-06-24 ENCOUNTER — Emergency Department (INDEPENDENT_AMBULATORY_CARE_PROVIDER_SITE_OTHER)
Admission: EM | Admit: 2015-06-24 | Discharge: 2015-06-24 | Disposition: A | Discharge status: Home / Self Care | Payer: Self-pay | Source: Home / Self Care

## 2015-06-24 DIAGNOSIS — N390 Urinary tract infection, site not specified: Secondary | ICD-10-CM

## 2015-06-24 LAB — POCT URINALYSIS DIP (DEVICE)
Bilirubin Urine: NEGATIVE
Glucose, UA: NEGATIVE mg/dL
HGB URINE DIPSTICK: NEGATIVE
Ketones, ur: NEGATIVE mg/dL
Nitrite: POSITIVE — AB
PROTEIN: NEGATIVE mg/dL
Specific Gravity, Urine: >=1.03 (ref 1.005–1.030)
UROBILINOGEN UA: 0.2 mg/dL (ref 0.0–1.0)
pH: 6 (ref 5.0–8.0)

## 2015-06-24 LAB — POCT PREGNANCY, URINE: Preg Test, Ur: NEGATIVE

## 2015-06-24 MED ORDER — PHENAZOPYRIDINE HCL 200 MG PO TABS: 200.0000 mg | ORAL_TABLET | Freq: Three times a day (TID) | ORAL | Status: DC | PRN

## 2015-06-24 MED ORDER — CEPHALEXIN 500 MG PO CAPS
500.0000 mg | ORAL_CAPSULE | Freq: Three times a day (TID) | ORAL | Status: DC
Start: 2015-06-24 — End: 2015-09-09

## 2015-06-24 NOTE — Discharge Instructions (Signed)

## 2015-06-24 NOTE — ED Provider Notes (Signed)
CSN: [id number]     Arrival date & time 06/24/15  1715 History   None    Chief Complaint  Patient presents with  . Urinary Tract Infection   (Consider location/radiation/quality/duration/timing/severity/associated sxs/prior Treatment) Patient is a 34 y.o. female presenting with urinary tract infection. The history is provided by the patient. No language interpreter was used.  Urinary Tract Infection Pain quality:  Burning (Frequent urination) Pain severity:  Moderate Timing:  Constant Progression:  Worsening Chronicity:  Recurrent Recent urinary tract infections: yes   Relieved by:  Cranberry juice (a little improvement but no complete relieve with cranberry) Worsened by:  Nothing tried Associated symptoms: no abdominal pain, no fever, no flank pain, no genital lesions, no nausea, no vaginal discharge and no vomiting   Risk factors: sexually active   Risk factors: no hx of pyelonephritis   Risk factors comment:  LMP: was few weeks ago, she is on Nuvo ring   Past Medical History  Diagnosis Date  . UTI (lower urinary tract infection)   . BV (bacterial vaginosis)    Past Surgical History  Procedure Laterality Date  . Cesarean section     Family History  Problem Relation Age of Onset  . Heart disease Father    Social History  Substance Use Topics  . Smoking status: Current Some Day Smoker -- 0.50 packs/day for 10 years    Types: Cigarettes  . Smokeless tobacco: Never Used  . Alcohol Use: No     Comment: seldom   OB History    Gravida Para Term Preterm AB TAB SAB Ectopic Multiple Living   3 1 1  1 1    1      Review of Systems  Constitutional: Negative for fever.  Respiratory: Negative.   Cardiovascular: Negative.   Gastrointestinal: Negative.  Negative for nausea, vomiting and abdominal pain.  Genitourinary: Positive for dysuria and frequency. Negative for flank pain, vaginal discharge, difficulty urinating, menstrual problem and pelvic pain.  All other systems  reviewed and are negative.   Allergies  Review of patient's allergies indicates no known allergies.  Home Medications   Prior to Admission medications   Medication Sig Start Date End Date Taking? Authorizing Kadience Macchi  amoxicillin (AMOXIL) 500 MG capsule Take 1 capsule (500 mg total) by mouth 3 (three) times daily. Patient not taking: Reported on 02/18/2015 01/08/15   Janne Napoleon, NP  cephALEXin (KEFLEX) 500 MG capsule Take 1 capsule (500 mg total) by mouth 4 (four) times daily. Patient not taking: Reported on 04/06/2015 02/18/15   Lajean Saver, MD  ciprofloxacin (CIPRO) 250 MG tablet Take 1 tablet (250 mg total) by mouth every 12 (twelve) hours. 04/06/15   Dalia Heading, PA-C  ibuprofen (ADVIL,MOTRIN) 800 MG tablet Take 1 tablet (800 mg total) by mouth every 8 (eight) hours as needed. Patient not taking: Reported on 11/19/2014 09/13/13   Evelina Bucy, MD  valACYclovir (VALTREX) 1000 MG tablet Take 2 tablets (2,000 mg total) by mouth 2 (two) times daily. 04/06/15   Dalia Heading, PA-C   Meds Ordered and Administered this Visit  Medications - No data to display  BP 137/89 mmHg  Pulse 65  Temp(Src) 98.5 F (36.9 C) (Oral)  Resp 20  SpO2 99%  LMP 06/10/2015 (LMP Unknown) No data found.   Physical Exam  Constitutional: She appears well-developed. No distress.  Cardiovascular: Normal rate, regular rhythm and normal heart sounds.   No murmur heard. Pulmonary/Chest: Effort normal and breath sounds normal. No respiratory distress. She has no  wheezes.  Abdominal: Soft. Bowel sounds are normal. She exhibits no distension and no mass. There is tenderness.  Mild suprapubic tenderness  Nursing note and vitals reviewed.   ED Course  Procedures (including critical care time)  Labs Review Labs Reviewed  POCT URINALYSIS DIP (DEVICE) - Abnormal; Notable for the following:    Nitrite POSITIVE (*)    Leukocytes, UA MODERATE (*)    All other components within normal limits  POCT  PREGNANCY, URINE    Imaging Review No results found.   Visual Acuity Review  Right Eye Distance:   Left Eye Distance:   Bilateral Distance:    Right Eye Near:   Left Eye Near:    Bilateral Near:         MDM  No diagnosis found. UTI  UA shows ++ Leukocyte and Nitrite. Urine sent for culture. I will call her with result. Keflex prescribed for UTI and Pyridium prn dysuria.    Kinnie Feil, MD 06/24/15 [phone]

## 2015-06-24 NOTE — ED Notes (Signed)
The patient presented to the [facility] with a complaint of dysuria, frequency and urgency in urination for about 2 weeks. The patient stated that she now has lower back pain. The patient stated that she tried cranberry supplements and water with no relief.

## 2015-06-28 ENCOUNTER — Telehealth: Payer: Self-pay | Admitting: Family Medicine

## 2015-06-28 LAB — URINE CULTURE: SPECIAL REQUESTS: NORMAL

## 2015-06-28 MED ORDER — LEVOFLOXACIN 750 MG PO TABS: 750.0000 mg | ORAL_TABLET | Freq: Every day | ORAL | Status: DC

## 2015-06-28 NOTE — Telephone Encounter (Signed)
I called to discuss her final urine culture report with her positive for Staph and enterococcus, she is currently on Keflex but still having same symptoms. She stated she was taking her Keflex twice a day instead of TID. I encouraged her to take her Keflex TID instead of BID as prescribed. I will add Levaquin to her regimen for enterococcus. F/U as needed if she continues to have symptoms. She agreed with plan.

## 2015-06-28 NOTE — ED Notes (Signed)
Called patient to discuss lab report. MD has already called in a new RX to cover the identified organism

## 2015-09-06 ENCOUNTER — Emergency Department (HOSPITAL_COMMUNITY)
Admission: EM | Admit: 2015-09-06 | Discharge: 2015-09-06 | Discharge status: Absent without leave | Payer: Self-pay | Source: Home / Self Care

## 2015-09-09 ENCOUNTER — Encounter (HOSPITAL_COMMUNITY): Payer: Self-pay

## 2015-09-09 ENCOUNTER — Emergency Department (INDEPENDENT_AMBULATORY_CARE_PROVIDER_SITE_OTHER)
Admission: EM | Admit: 2015-09-09 | Discharge: 2015-09-09 | Disposition: A | Discharge status: Home / Self Care | Payer: Self-pay | Source: Home / Self Care | Attending: Family Medicine | Admitting: Family Medicine

## 2015-09-09 ENCOUNTER — Other Ambulatory Visit (HOSPITAL_COMMUNITY)
Admission: RE | Admit: 2015-09-09 | Discharge: 2015-09-09 | Disposition: A | Discharge status: Home / Self Care | Payer: Self-pay | Source: Ambulatory Visit | Attending: Family Medicine | Admitting: Family Medicine

## 2015-09-09 DIAGNOSIS — R350 Frequency of micturition: Secondary | ICD-10-CM | POA: Insufficient documentation

## 2015-09-09 LAB — POCT URINALYSIS DIP (DEVICE)
BILIRUBIN URINE: NEGATIVE
Glucose, UA: NEGATIVE mg/dL
HGB URINE DIPSTICK: NEGATIVE
Ketones, ur: NEGATIVE mg/dL
NITRITE: NEGATIVE
PH: 7 (ref 5.0–8.0)
PROTEIN: NEGATIVE mg/dL
Specific Gravity, Urine: 1.02 (ref 1.005–1.030)
Urobilinogen, UA: 0.2 mg/dL (ref 0.0–1.0)

## 2015-09-09 LAB — POCT PREGNANCY, URINE: Preg Test, Ur: NEGATIVE

## 2015-09-09 MED ORDER — CIPROFLOXACIN HCL 500 MG PO TABS: 500.0000 mg | ORAL_TABLET | Freq: Two times a day (BID) | ORAL | Status: AC

## 2015-09-09 NOTE — ED Notes (Signed)
Pt stated that she has had urinary frequency and lower back pain for 2 weeks Pt alert and oriented

## 2015-09-09 NOTE — ED Provider Notes (Signed)
CSN: [id number]     Arrival date & time 09/09/15  1925 History   First MD Initiated Contact with Patient 09/09/15 1939     No chief complaint on file.  (Consider location/radiation/quality/duration/timing/severity/associated sxs/prior Treatment) The history is provided by the patient. No language interpreter was used.  Urine frequency: C/O frequent urination x 2 wks. She can not count how many times she urinates during the day. She gets up at night to urinate as well. She has associated aching lower back pain, no N/V, she denies belly pain. She has had recurrent UTI, her last infection was 2 months ago. This is her typical presentation. Denies fever at home. She has been drinking water and cranberry juice, and she uses tylenol or ibuprofen for pain. She denies fall or trauma to the back. She denies dysuria or change in urine color.  No blood in her urine.  Past Medical History  Diagnosis Date  . UTI (lower urinary tract infection)   . BV (bacterial vaginosis)    Past Surgical History  Procedure Laterality Date  . Cesarean section     Family History  Problem Relation Age of Onset  . Heart disease Father    Social History  Substance Use Topics  . Smoking status: Current Some Day Smoker -- 0.50 packs/day for 10 years    Types: Cigarettes  . Smokeless tobacco: Never Used  . Alcohol Use: No     Comment: seldom   OB History    Gravida Para Term Preterm AB TAB SAB Ectopic Multiple Living   3 1 1  1 1    1      Review of Systems  Respiratory: Negative.   Cardiovascular: Negative.   Genitourinary: Positive for flank pain. Negative for dysuria.       Increase urine frequency  All other systems reviewed and are negative.   Allergies  Review of patient's allergies indicates no known allergies.  Home Medications   Prior to Admission medications   Medication Sig Start Date End Date Taking? Authorizing Provider  cephALEXin (KEFLEX) 500 MG capsule Take 1 capsule (500 mg total) by  mouth 3 (three) times daily. 06/24/15   Kinnie Feil, MD  ibuprofen (ADVIL,MOTRIN) 800 MG tablet Take 1 tablet (800 mg total) by mouth every 8 (eight) hours as needed. Patient not taking: Reported on 11/19/2014 09/13/13   Evelina Bucy, MD  levofloxacin (LEVAQUIN) 750 MG tablet Take 1 tablet (750 mg total) by mouth daily. 06/28/15   Kinnie Feil, MD  phenazopyridine (PYRIDIUM) 200 MG tablet Take 1 tablet (200 mg total) by mouth 3 (three) times daily as needed for pain. 06/24/15   Kinnie Feil, MD  valACYclovir (VALTREX) 1000 MG tablet Take 2 tablets (2,000 mg total) by mouth 2 (two) times daily. 04/06/15   Dalia Heading, PA-C   Meds Ordered and Administered this Visit  Medications - No data to display  There were no vitals taken for this visit. No data found.   Physical Exam  Constitutional: She appears well-developed. No distress.  Cardiovascular: Normal rate, regular rhythm and normal heart sounds.   No murmur heard. Pulmonary/Chest: Effort normal and breath sounds normal. No respiratory distress. She has no wheezes.  Abdominal: Soft. Bowel sounds are normal. She exhibits no distension and no mass. There is no tenderness. There is no CVA tenderness.  Nursing note and vitals reviewed.   ED Course  Procedures (including critical care time)  Labs Review Labs Reviewed - No data to display  Imaging Review No results found.   Visual Acuity Review  Right Eye Distance:   Left Eye Distance:   Bilateral Distance:    Right Eye Near:   Left Eye Near:    Bilateral Near:     Urinalysis    Component Value Date/Time   COLORURINE YELLOW 04/06/2015 1959   APPEARANCEUR CLEAR 04/06/2015 1959   LABSPEC 1.020 09/09/2015 1952   PHURINE 7.0 09/09/2015 1952   GLUCOSEU NEGATIVE 09/09/2015 1952   HGBUR NEGATIVE 09/09/2015 1952   BILIRUBINUR NEGATIVE 09/09/2015 1952   KETONESUR NEGATIVE 09/09/2015 1952   PROTEINUR NEGATIVE 09/09/2015 1952   UROBILINOGEN 0.2 09/09/2015 1952    NITRITE NEGATIVE 09/09/2015 1952   LEUKOCYTESUR SMALL* 09/09/2015 1952         MDM  No diagnosis found. Urine frequency  Urinalysis result reviewed. Low suspicion for UTI. Urine sent to low for culture and she is given short course A/B treatment since she is symptomatic and she has had recurrent episodes of UTI. I will contact her with urine culture report.    Kinnie Feil, MD 09/09/15 2005

## 2015-09-09 NOTE — Discharge Instructions (Signed)
It was nice seeing your. Your urinalysis shows slight suspicion for UTI. I have sent your urine to lab for culture. Since you are having symptoms, I will give you antibiotic treatment. I will call you with your urine result.  Urinary Frequency The number of times a normal person urinates depends upon how much liquid they take in and how much liquid they are losing. If the temperature is hot and there is high humidity, then the person will sweat more and usually breathe a little more frequently. These factors decrease the amount of frequency of urination that would be considered normal. The amount you drink is easily determined, but the amount of fluid lost is sometimes more difficult to calculate.  Fluid is lost in two ways:  Sensible fluid loss is usually measured by the amount of urine that you get rid of. Losses of fluid can also occur with diarrhea.  Insensible fluid loss is more difficult to measure. It is caused by evaporation. Insensible loss of fluid occurs through breathing and sweating. It usually ranges from a little less than a quart to a little more than a quart of fluid a day. In normal temperatures and activity levels, the average person may urinate 4 to 7 times in a 24-hour period. Needing to urinate more often than that could indicate a problem. If one urinates 4 to 7 times in 24 hours and has large volumes each time, that could indicate a different problem from one who urinates 4 to 7 times a day and has small volumes. The time of urinating is also important. Most urinating should be done during the waking hours. Getting up at night to urinate frequently can indicate some problems. CAUSES  The bladder is the organ in your lower abdomen that holds urine. Like a balloon, it swells some as it fills up. Your nerves sense this and tell you it is time to head for the bathroom. There are a number of reasons that you might feel the need to urinate more often than usual. They include:  Urinary  tract infection. This is usually associated with other signs such as burning when you urinate.  In men, problems with the prostate (a walnut-size gland that is located near the tube that carries urine out of your body). There are two reasons why the prostate can cause an increased frequency of urination:  An enlarged prostate that does not let the bladder empty well. If the bladder only half empties when you urinate, then it only has half the capacity to fill before you have to urinate again.  The nerves in the bladder become more hypersensitive with an increased size of the prostate even if the bladder empties completely.  Pregnancy.  Obesity. Excess weight is more likely to cause a problem for women than for men.  Bladder stones or other bladder problems.  Caffeine.  Alcohol.  Medications. For example, drugs that help the body get rid of extra fluid (diuretics) increase urine production. Some other medicines must be taken with lots of fluids.  Muscle or nerve weakness. This might be the result of a spinal cord injury, a stroke, multiple sclerosis, or Parkinson disease.  Long-standing diabetes can decrease the sensation of the bladder. This loss of sensation makes it harder to sense the bladder needs to be emptied. Over a period of years, the bladder is stretched out by constant overfilling. This weakens the bladder muscles so that the bladder does not empty well and has less capacity to fill with  new urine.  Interstitial cystitis (also called painful bladder syndrome). This condition develops because the tissues that line the inside of the bladder are inflamed (inflammation is the body's way of reacting to injury or infection). It causes pain and frequent urination. It occurs in women more often than in men. DIAGNOSIS   To decide what might be causing your urinary frequency, your health care provider will probably:  Ask about symptoms you have noticed.  Ask about your overall health.  This will include questions about any medications you are taking.  Do a physical examination.  Order some tests. These might include:  A blood test to check for diabetes or other health issues that could be contributing to the problem.  Urine testing. This could measure the flow of urine and the pressure on the bladder.  A test of your neurological system (the brain, spinal cord, and nerves). This is the system that senses the need to urinate.  A bladder test to check whether it is emptying completely when you urinate.  Cystoscopy. This test uses a thin tube with a tiny camera on it. It offers a look inside your urethra and bladder to see if there are problems.  Imaging tests. You might be given a contrast dye and then asked to urinate. X-rays are taken to see how your bladder is working. TREATMENT  It is important for you to be evaluated to determine if the amount or frequency that you have is unusual or abnormal. If it is found to be abnormal, the cause should be determined and this can usually be found out easily. Depending upon the cause, treatment could include medication, stimulation of the nerves, or surgery. There are not too many things that you can do as an individual to change your urinary frequency. It is important that you balance the amount of fluid intake needed to compensate for your activity and the temperature. Medical problems will be diagnosed and taken care of by your physician. There is no particular bladder training such as Kegel exercises that you can do to help urinary frequency. This is an exercise that is usually recommended for people who have leaking of urine when they laugh, cough, or sneeze. HOME CARE INSTRUCTIONS   Take any medications your health care provider prescribed or suggested. Follow the directions carefully.  Practice any lifestyle changes that are recommended. These might include:  Drinking less fluid or drinking at different times of the day. If  you need to urinate often during the night, for example, you may need to stop drinking fluids early in the evening.  Cutting down on caffeine or alcohol. They both can make you need to urinate more often than normal. Caffeine is found in coffee, tea, and sodas.  Losing weight, if that is recommended.  Keep a journal or a log. You might be asked to record how much you drink and when and where you feel the need to urinate. This will also help evaluate how well the treatment provided by your physician is working. SEEK MEDICAL CARE IF:   Your need to urinate often gets worse.  You feel increased pain or irritation when you urinate.  You notice blood in your urine.  You have questions about any medications that your health care provider recommended.  You notice blood, pus, or swelling at the site of any test or treatment procedure.  You develop a fever of more than 100.108F (38.1C). SEEK IMMEDIATE MEDICAL CARE IF:  You develop a fever of more  than 102.58F (38.9C).   This information is not intended to replace advice given to you by your health care provider. Make sure you discuss any questions you have with your health care provider.   Document Released: 07/12/2009 Document Revised: 10/06/2014 Document Reviewed: 07/12/2009 Elsevier Interactive Patient Education [employer].

## 2015-09-11 LAB — URINE CULTURE: Special Requests: NORMAL

## 2015-10-05 ENCOUNTER — Emergency Department (INDEPENDENT_AMBULATORY_CARE_PROVIDER_SITE_OTHER)
Admission: EM | Admit: 2015-10-05 | Discharge: 2015-10-05 | Disposition: A | Discharge status: Home / Self Care | Payer: Self-pay | Source: Home / Self Care | Attending: Family Medicine | Admitting: Family Medicine

## 2015-10-05 ENCOUNTER — Encounter (HOSPITAL_COMMUNITY): Payer: Self-pay | Admitting: Emergency Medicine

## 2015-10-05 DIAGNOSIS — N760 Acute vaginitis: Secondary | ICD-10-CM

## 2015-10-05 DIAGNOSIS — A499 Bacterial infection, unspecified: Secondary | ICD-10-CM

## 2015-10-05 DIAGNOSIS — B9689 Other specified bacterial agents as the cause of diseases classified elsewhere: Secondary | ICD-10-CM

## 2015-10-05 DIAGNOSIS — N390 Urinary tract infection, site not specified: Secondary | ICD-10-CM

## 2015-10-05 LAB — POCT URINALYSIS DIP (DEVICE)
BILIRUBIN URINE: NEGATIVE
GLUCOSE, UA: NEGATIVE mg/dL
KETONES UR: NEGATIVE mg/dL
NITRITE: NEGATIVE
Protein, ur: NEGATIVE mg/dL
UROBILINOGEN UA: 0.2 mg/dL (ref 0.0–1.0)
pH: 6 (ref 5.0–8.0)

## 2015-10-05 LAB — POCT PREGNANCY, URINE: PREG TEST UR: NEGATIVE

## 2015-10-05 MED ORDER — METRONIDAZOLE 500 MG PO TABS: 500.0000 mg | ORAL_TABLET | Freq: Two times a day (BID) | ORAL | Status: DC

## 2015-10-05 MED ORDER — CEPHALEXIN 500 MG PO CAPS: 500.0000 mg | ORAL_CAPSULE | Freq: Four times a day (QID) | ORAL | Status: DC

## 2015-10-05 NOTE — ED Notes (Signed)
History of uti's.  Reports having symptoms for a couple of weeks.  Reports urinating frequently.

## 2015-10-05 NOTE — ED Notes (Signed)
Concerned for uti for 2 weeks

## 2015-10-05 NOTE — ED Provider Notes (Signed)
CSN: [id number]     Arrival date & time 10/05/15  1836 History   First MD Initiated Contact with Patient 10/05/15 1852     Chief Complaint  Patient presents with  . Urinary Tract Infection   (Consider location/radiation/quality/duration/timing/severity/associated sxs/prior Treatment) Patient is a 35 y.o. female presenting with urinary tract infection. The history is provided by the patient.  Urinary Tract Infection Pain quality:  Burning and sharp Pain severity:  Mild Onset quality:  Gradual Duration:  2 weeks Progression:  Worsening Chronicity:  New Recent urinary tract infections: yes   Relieved by:  None tried Worsened by:  Nothing tried Ineffective treatments:  None tried Urinary symptoms: foul-smelling urine and frequent urination   Associated symptoms: vaginal discharge   Associated symptoms: no fever and no flank pain   Risk factors: recurrent urinary tract infections and sexually active   Risk factors comment:  Freq uti and bv problems, thinks she may have both  now and would like meds.   Past Medical History  Diagnosis Date  . UTI (lower urinary tract infection)   . BV (bacterial vaginosis)    Past Surgical History  Procedure Laterality Date  . Cesarean section     Family History  Problem Relation Age of Onset  . Heart disease Father    Social History  Substance Use Topics  . Smoking status: Current Some Day Smoker -- 0.50 packs/day for 10 years    Types: Cigarettes  . Smokeless tobacco: Never Used  . Alcohol Use: No     Comment: seldom   OB History    Gravida Para Term Preterm AB TAB SAB Ectopic Multiple Living   3 1 1  1 1    1      Review of Systems  Constitutional: Negative.  Negative for fever.  Gastrointestinal: Negative.   Genitourinary: Positive for dysuria, urgency, frequency and vaginal discharge. Negative for flank pain and vaginal pain.  All other systems reviewed and are negative.   Allergies  Review of patient's allergies indicates no  known allergies.  Home Medications   Prior to Admission medications   Medication Sig Start Date End Date Taking? Authorizing Provider  cephALEXin (KEFLEX) 500 MG capsule Take 1 capsule (500 mg total) by mouth 4 (four) times daily. Take all of medicine and drink lots of fluids 10/05/15   Billy Fischer, MD  ibuprofen (ADVIL,MOTRIN) 800 MG tablet Take 1 tablet (800 mg total) by mouth every 8 (eight) hours as needed. Patient not taking: Reported on 11/19/2014 09/13/13   Evelina Bucy, MD  metroNIDAZOLE (FLAGYL) 500 MG tablet Take 1 tablet (500 mg total) by mouth 2 (two) times daily. 10/05/15   Billy Fischer, MD  phenazopyridine (PYRIDIUM) 200 MG tablet Take 1 tablet (200 mg total) by mouth 3 (three) times daily as needed for pain. 06/24/15   Kinnie Feil, MD  valACYclovir (VALTREX) 1000 MG tablet Take 2 tablets (2,000 mg total) by mouth 2 (two) times daily. 04/06/15   Dalia Heading, PA-C   Meds Ordered and Administered this Visit  Medications - No data to display  BP 136/80 mmHg  Pulse 89  Temp(Src) 98.2 F (36.8 C) (Oral)  Resp 16  SpO2 99%  LMP 09/26/2015 No data found.   Physical Exam  Constitutional: She is oriented to person, place, and time.  Abdominal: Soft. Normal appearance and bowel sounds are normal. There is no hepatosplenomegaly. There is tenderness in the suprapubic area. There is no rigidity, no guarding, no CVA tenderness,  no tenderness at McBurney's point and negative Murphy's sign.  Neurological: She is alert and oriented to person, place, and time.  Skin: Skin is warm and dry.  Nursing note and vitals reviewed.   ED Course  Procedures (including critical care time)  Labs Review Labs Reviewed  POCT URINALYSIS DIP (DEVICE) - Abnormal; Notable for the following:    Hgb urine dipstick TRACE (*)    Leukocytes, UA LARGE (*)    All other components within normal limits  POCT PREGNANCY, URINE    Imaging Review No results found.   Visual Acuity Review  Right  Eye Distance:   Left Eye Distance:   Bilateral Distance:    Right Eye Near:   Left Eye Near:    Bilateral Near:         MDM   1. UTI (lower urinary tract infection)   2. BV (bacterial vaginosis)        Billy Fischer, MD 10/05/15 [phone]

## 2015-10-05 NOTE — Discharge Instructions (Signed)
Take all of medicine as directed, drink lots of fluids, see your doctor if further problems. °

## 2015-10-31 ENCOUNTER — Emergency Department (HOSPITAL_COMMUNITY)
Admission: EM | Admit: 2015-10-31 | Discharge: 2015-10-31 | Disposition: A | Discharge status: Home / Self Care | Payer: Self-pay | Attending: Emergency Medicine | Admitting: Emergency Medicine

## 2015-10-31 ENCOUNTER — Encounter (HOSPITAL_COMMUNITY): Payer: Self-pay | Admitting: Family Medicine

## 2015-10-31 DIAGNOSIS — Z792 Long term (current) use of antibiotics: Secondary | ICD-10-CM | POA: Insufficient documentation

## 2015-10-31 DIAGNOSIS — F17210 Nicotine dependence, cigarettes, uncomplicated: Secondary | ICD-10-CM | POA: Insufficient documentation

## 2015-10-31 DIAGNOSIS — Z3202 Encounter for pregnancy test, result negative: Secondary | ICD-10-CM | POA: Insufficient documentation

## 2015-10-31 DIAGNOSIS — N946 Dysmenorrhea, unspecified: Secondary | ICD-10-CM

## 2015-10-31 DIAGNOSIS — Z87440 Personal history of urinary (tract) infections: Secondary | ICD-10-CM | POA: Insufficient documentation

## 2015-10-31 DIAGNOSIS — Z79899 Other long term (current) drug therapy: Secondary | ICD-10-CM | POA: Insufficient documentation

## 2015-10-31 LAB — WET PREP, GENITAL
CLUE CELLS WET PREP: NONE SEEN
SPERM: NONE SEEN
TRICH WET PREP: NONE SEEN
WBC WET PREP: NONE SEEN
Yeast Wet Prep HPF POC: NONE SEEN

## 2015-10-31 LAB — CBC
HEMATOCRIT: 45.4 % (ref 36.0–46.0)
HEMOGLOBIN: 15.5 g/dL — AB (ref 12.0–15.0)
MCH: 30.4 pg (ref 26.0–34.0)
MCHC: 34.1 g/dL (ref 30.0–36.0)
MCV: 89 fL (ref 78.0–100.0)
Platelets: 185 10*3/uL (ref 150–400)
RBC: 5.1 MIL/uL (ref 3.87–5.11)
RDW: 12.8 % (ref 11.5–15.5)
WBC: 7.3 10*3/uL (ref 4.0–10.5)

## 2015-10-31 LAB — I-STAT BETA HCG BLOOD, ED (MC, WL, AP ONLY): I-stat hCG, quantitative: <5 m[IU]/mL (ref <5)

## 2015-10-31 MED ORDER — NAPROXEN 500 MG PO TABS: 500.0000 mg | ORAL_TABLET | Freq: Two times a day (BID) | ORAL | Status: DC

## 2015-10-31 MED ORDER — KETOROLAC TROMETHAMINE 60 MG/2ML IM SOLN
60.0000 mg | Freq: Once | INTRAMUSCULAR | Status: AC
  Administered 2015-10-31: 60 mg via INTRAMUSCULAR
  Filled 2015-10-31: qty 2

## 2015-10-31 NOTE — Discharge Instructions (Signed)

## 2015-10-31 NOTE — ED Notes (Signed)
Pt here for lower abd pain, back pain and  vaginal bleeding. sts that she took the morning after pill 2 days ago and today had a positive pregnancy tests. sts that her cycle last month was spotting and she has been switching birth controls,.

## 2015-10-31 NOTE — ED Provider Notes (Signed)
CSN: [id number]     Arrival date & time 10/31/15  1406 History  By signing my name below, I, Starleen Arms, attest that this documentation has been prepared under the direction and in the presence of Debroah Baller, NP. Electronically Signed: Starleen Arms ED Scribe. 10/31/2015. 2:28 PM.    Chief Complaint  Patient presents with  . Vaginal Bleeding   Patient is a 35 y.o. female presenting with vaginal bleeding. The history is provided by the patient. No language interpreter was used.  Vaginal Bleeding Quality:  Bright red Severity:  Moderate Duration:  2 days Progression:  Worsening Chronicity:  New Possible pregnancy: yes   Relieved by:  Nothing Worsened by:  Nothing tried Ineffective treatments:  None tried Associated symptoms: abdominal pain and back pain   Associated symptoms: no fever    HPI Comments: Stephanie [name] is a 18 y.o. G61P1A1 female with no chronic conditions who presents to the Emergency Department complaining of initially light, gradually worsening, bright red vaginal spotting onset two days ago.  The patient states she took an ella contraceptive shortly preceding onset of her complaint.  She has used this medication previously without complication.  Today, she developed chills, slight dizziness with standing, lower back pain, and waxing and waning severe abdominal cramping.  The patient stopped use of Nuva Ring ~ 1 month ago and shortly after had an episode of light spotting consistent with her periods while using Nuva Ring.  She is planning to have an IUD placed.  She denies fever. She takes no medications regularly.     Past Medical History  Diagnosis Date  . UTI (lower urinary tract infection)   . BV (bacterial vaginosis)    Past Surgical History  Procedure Laterality Date  . Cesarean section     Family History  Problem Relation Age of Onset  . Heart disease Father    Social History  Substance Use Topics  . Smoking status: Current Some Day Smoker -- 0.50  packs/day for 10 years    Types: Cigarettes  . Smokeless tobacco: Never Used  . Alcohol Use: No     Comment: seldom   OB History    Gravida Para Term Preterm AB TAB SAB Ectopic Multiple Living   3 1 1  1 1    1      Review of Systems  Constitutional: Negative for fever.  Gastrointestinal: Positive for abdominal pain.  Genitourinary: Positive for vaginal bleeding.  Musculoskeletal: Positive for back pain.   A complete 10 system review of systems was obtained and all systems are negative except as noted in the HPI and PMH.   Allergies  Review of patient's allergies indicates no known allergies.  Home Medications   Prior to Admission medications   Medication Sig Start Date End Date Taking? Authorizing Provider  cephALEXin (KEFLEX) 500 MG capsule Take 1 capsule (500 mg total) by mouth 4 (four) times daily. Take all of medicine and drink lots of fluids 10/05/15   Billy Fischer, MD  ibuprofen (ADVIL,MOTRIN) 800 MG tablet Take 1 tablet (800 mg total) by mouth every 8 (eight) hours as needed. Patient not taking: Reported on 11/19/2014 09/13/13   Evelina Bucy, MD  metroNIDAZOLE (FLAGYL) 500 MG tablet Take 1 tablet (500 mg total) by mouth 2 (two) times daily. 10/05/15   Billy Fischer, MD  naproxen (NAPROSYN) 500 MG tablet Take 1 tablet (500 mg total) by mouth 2 (two) times daily. 10/31/15   [place], NP  phenazopyridine (  PYRIDIUM) 200 MG tablet Take 1 tablet (200 mg total) by mouth 3 (three) times daily as needed for pain. 06/24/15   Kinnie Feil, MD  valACYclovir (VALTREX) 1000 MG tablet Take 2 tablets (2,000 mg total) by mouth 2 (two) times daily. 04/06/15   Christopher Lawyer, PA-C   BP 138/98 mmHg  Pulse 99  Temp(Src) 98 F (36.7 C)  Resp 18  SpO2 100% Physical Exam  Constitutional: She is oriented to person, place, and time. She appears well-developed and well-nourished. No distress.  HENT:  Head: Normocephalic and atraumatic.  Eyes: Conjunctivae and EOM are normal.  Neck: Neck  supple. No tracheal deviation present.  Cardiovascular: Normal rate.   Pulmonary/Chest: Effort normal. No respiratory distress.  Abdominal: Soft. There is tenderness. There is no rebound and no guarding.  Genitourinary:  External genitalia without lesions, moderate blood vaginal vault, mild CMT, no adnexal tenderness, uterus without palpable enlargement.   Musculoskeletal: Normal range of motion.  Neurological: She is alert and oriented to person, place, and time.  Skin: Skin is warm and dry.  Psychiatric: She has a normal mood and affect. Her behavior is normal.  Nursing note and vitals reviewed.   ED Course  Procedures (including critical care time)  DIAGNOSTIC STUDIES: Oxygen Saturation is 100% on RA, normal by my interpretation.    COORDINATION OF CARE:  2:46 PM Will order labs and perform pelvic exam.  Patient acknowledges and agrees with plan.    Labs Review Labs Reviewed  CBC - Abnormal; Notable for the following:    Hemoglobin 15.5 (*)    All other components within normal limits  WET PREP, GENITAL  I-STAT BETA HCG BLOOD, ED ([facility], [facility], [facility] ONLY)  GC/CHLAMYDIA PROBE AMP ([place]) NOT AT [facility]   Toradol 60 mg IM  MDM  35 y.o. female with vaginal bleeding and cramping and time for regular menses. Patient took plan B prior to cramping and bleeding and was concerned that she may be pregnant. Stable for d/c with negative Bhcg. Will treat for pain. Patient will follow up with her doctor at [facility].    Final diagnoses:  Dysmenorrhea   I personally performed the services described in this documentation, which was scribed in my presence. The recorded information has been reviewed and is accurate.    [place], [place] 10/31/15 1457  Dorie Rank, MD 10/31/15 [phone]

## 2015-11-01 LAB — GC/CHLAMYDIA PROBE AMP (~~LOC~~) NOT AT ARMC
Chlamydia: NEGATIVE
Neisseria Gonorrhea: NEGATIVE

## 2016-01-06 ENCOUNTER — Encounter (HOSPITAL_COMMUNITY): Payer: Self-pay | Admitting: Emergency Medicine

## 2016-01-06 ENCOUNTER — Emergency Department (HOSPITAL_COMMUNITY)
Admission: EM | Admit: 2016-01-06 | Discharge: 2016-01-07 | Disposition: A | Discharge status: Home / Self Care | Payer: Self-pay | Attending: Emergency Medicine | Admitting: Emergency Medicine

## 2016-01-06 DIAGNOSIS — R42 Dizziness and giddiness: Secondary | ICD-10-CM | POA: Insufficient documentation

## 2016-01-06 DIAGNOSIS — N390 Urinary tract infection, site not specified: Secondary | ICD-10-CM | POA: Insufficient documentation

## 2016-01-06 DIAGNOSIS — Z3202 Encounter for pregnancy test, result negative: Secondary | ICD-10-CM | POA: Insufficient documentation

## 2016-01-06 DIAGNOSIS — Z8742 Personal history of other diseases of the female genital tract: Secondary | ICD-10-CM | POA: Insufficient documentation

## 2016-01-06 DIAGNOSIS — F17210 Nicotine dependence, cigarettes, uncomplicated: Secondary | ICD-10-CM | POA: Insufficient documentation

## 2016-01-06 DIAGNOSIS — R6883 Chills (without fever): Secondary | ICD-10-CM | POA: Insufficient documentation

## 2016-01-06 LAB — URINE MICROSCOPIC-ADD ON

## 2016-01-06 LAB — BASIC METABOLIC PANEL
ANION GAP: 11 (ref 5–15)
BUN: 11 mg/dL (ref 6–20)
CHLORIDE: 106 mmol/L (ref 101–111)
CO2: 20 mmol/L — AB (ref 22–32)
Calcium: 9 mg/dL (ref 8.9–10.3)
Creatinine, Ser: 0.77 mg/dL (ref 0.44–1.00)
GFR calc Af Amer: >60 mL/min (ref >60)
GFR calc non Af Amer: >60 mL/min (ref >60)
GLUCOSE: 95 mg/dL (ref 65–99)
POTASSIUM: 3.7 mmol/L (ref 3.5–5.1)
Sodium: 137 mmol/L (ref 135–145)

## 2016-01-06 LAB — URINALYSIS, ROUTINE W REFLEX MICROSCOPIC
BILIRUBIN URINE: NEGATIVE
Glucose, UA: NEGATIVE mg/dL
HGB URINE DIPSTICK: NEGATIVE
KETONES UR: 15 mg/dL — AB
NITRITE: NEGATIVE
Protein, ur: NEGATIVE mg/dL
SPECIFIC GRAVITY, URINE: 1.016 (ref 1.005–1.030)
pH: 6.5 (ref 5.0–8.0)

## 2016-01-06 LAB — PREGNANCY, URINE: Preg Test, Ur: NEGATIVE

## 2016-01-06 MED ORDER — CEPHALEXIN 500 MG PO CAPS: 500.0000 mg | ORAL_CAPSULE | Freq: Three times a day (TID) | ORAL | Status: DC

## 2016-01-06 NOTE — ED Notes (Signed)
Patient is having a lot of pain in her lower back. Patient states she feel light headed. Patient states she has a history of UTI's

## 2016-01-06 NOTE — Discharge Instructions (Signed)
1. Medications: keflex, usual home medications 2. Treatment: rest, drink plenty of fluids 3. Follow Up: please followup with your primary doctor this week for discussion of your diagnoses and further evaluation after today's visit; if you do not have a primary care doctor use the phone number listed in your discharge paperwork to find one; please return to the ER for high fever, severe pain, new or worsening symptoms   Urinary Tract Infection A urinary tract infection (UTI) can occur any place along the urinary tract. The tract includes the kidneys, ureters, bladder, and urethra. A type of germ called bacteria often causes a UTI. UTIs are often helped with antibiotic medicine.  HOME CARE   If given, take antibiotics as told by your doctor. Finish them even if you start to feel better.  Drink enough fluids to keep your pee (urine) clear or pale yellow.  Avoid tea, drinks with caffeine, and bubbly (carbonated) drinks.  Pee often. Avoid holding your pee in for a long time.  Pee before and after having sex (intercourse).  Wipe from front to back after you poop (bowel movement) if you are a woman. Use each tissue only once. GET HELP RIGHT AWAY IF:   You have back pain.  You have lower belly (abdominal) pain.  You have chills.  You feel sick to your stomach (nauseous).  You throw up (vomit).  Your burning or discomfort with peeing does not go away.  You have a fever.  Your symptoms are not better in 3 days. MAKE SURE YOU:   Understand these instructions.  Will watch your condition.  Will get help right away if you are not doing well or get worse.   This information is not intended to replace advice given to you by your health care provider. Make sure you discuss any questions you have with your health care provider.   Document Released: 03/03/2008 Document Revised: 10/06/2014 Document Reviewed: 04/15/2012 Elsevier Interactive Patient Education [employer].

## 2016-01-06 NOTE — ED Provider Notes (Signed)
CSN: [id number]     Arrival date & time 01/06/16  2050 History   First MD Initiated Contact with Patient 01/06/16 2110     Chief Complaint  Patient presents with  . Back Pain    HPI   Stephanie [name] is a 35 y.o. female with a PMH of UTI, BV who presents to the ED with low back pain, dysuria, urgency, and frequency, which she states started a couple of days ago and has progressively worsened. She reports associated lightheadedness. She notes she has a history of UTIs and that her symptoms feel similar. She denies fever, though reports chills. She denies abdominal pain, N/V/D/C, numbness, weakness, paresthesia, bowel or bladder incontinence, saddle anesthesia. She denies exacerbating or alleviating factors.   Past Medical History  Diagnosis Date  . UTI (lower urinary tract infection)   . BV (bacterial vaginosis)    Past Surgical History  Procedure Laterality Date  . Cesarean section     Family History  Problem Relation Age of Onset  . Heart disease Father    Social History  Substance Use Topics  . Smoking status: Current Some Day Smoker -- 0.50 packs/day for 10 years    Types: Cigarettes  . Smokeless tobacco: Never Used  . Alcohol Use: No     Comment: seldom   OB History    Gravida Para Term Preterm AB TAB SAB Ectopic Multiple Living   3 1 1  1 1    1       Review of Systems  Constitutional: Positive for chills. Negative for fever.  Gastrointestinal: Negative for nausea, vomiting, abdominal pain, diarrhea and constipation.  Genitourinary: Positive for dysuria, urgency and frequency. Negative for vaginal bleeding and vaginal discharge.  Musculoskeletal: Positive for back pain.  Neurological: Positive for light-headedness. Negative for weakness and numbness.  All other systems reviewed and are negative.     Allergies  Review of patient's allergies indicates no known allergies.  Home Medications   Prior to Admission medications   Medication Sig Start Date End  Date Taking? Authorizing Provider  ibuprofen (ADVIL,MOTRIN) 200 MG tablet Take 400 mg by mouth every 6 (six) hours as needed (for pain.).   Yes Historical Provider, MD  cephALEXin (KEFLEX) 500 MG capsule Take 1 capsule (500 mg total) by mouth 3 (three) times daily. 01/06/16   Marella Chimes, PA-C  metroNIDAZOLE (FLAGYL) 500 MG tablet Take 1 tablet (500 mg total) by mouth 2 (two) times daily. Patient not taking: Reported on 01/06/2016 10/05/15   Billy Fischer, MD  naproxen (NAPROSYN) 500 MG tablet Take 1 tablet (500 mg total) by mouth 2 (two) times daily. Patient not taking: Reported on 01/06/2016 10/31/15   Ashley Murrain, NP    BP 117/75 mmHg  Pulse 83  Temp(Src) 98 F (36.7 C) (Oral)  Resp 20  Ht 5\' 10"  (1.778 m)  Wt 77.111 kg  BMI 24.39 kg/m2  SpO2 98%  LMP 12/19/2015 (Approximate) Physical Exam  Constitutional: She is oriented to person, place, and time. She appears well-developed and well-nourished. No distress.  HENT:  Head: Normocephalic and atraumatic.  Right Ear: External ear normal.  Left Ear: External ear normal.  Nose: Nose normal.  Mouth/Throat: Uvula is midline, oropharynx is clear and moist and mucous membranes are normal.  Eyes: Conjunctivae, EOM and lids are normal. Pupils are equal, round, and reactive to light. Right eye exhibits no discharge. Left eye exhibits no discharge. No scleral icterus.  Neck: Normal range of motion. Neck  supple.  Cardiovascular: Normal rate, regular rhythm, normal heart sounds, intact distal pulses and normal pulses.   Pulmonary/Chest: Effort normal and breath sounds normal. No respiratory distress. She has no wheezes. She has no rales.  Abdominal: Soft. Normal appearance and bowel sounds are normal. She exhibits no distension and no mass. There is no tenderness. There is no rigidity, no rebound and no guarding.  No CVA TTP.  Musculoskeletal: Normal range of motion. She exhibits no edema or tenderness.  No TTP to thoracic or lumbar spine or  paraspinal muscles.  Neurological: She is alert and oriented to person, place, and time. She has normal strength and normal reflexes. No cranial nerve deficit or sensory deficit.  Skin: Skin is warm, dry and intact. No rash noted. She is not diaphoretic. No erythema. No pallor.  Psychiatric: She has a normal mood and affect. Her speech is normal and behavior is normal.  Nursing note and vitals reviewed.   ED Course  Procedures (including critical care time)  Labs Review Labs Reviewed  URINALYSIS, ROUTINE W REFLEX MICROSCOPIC (NOT AT [facility]) - Abnormal; Notable for the following:    APPearance CLOUDY (*)    Ketones, ur 15 (*)    Leukocytes, UA SMALL (*)    All other components within normal limits  BASIC METABOLIC PANEL - Abnormal; Notable for the following:    CO2 20 (*)    All other components within normal limits  URINE MICROSCOPIC-ADD ON - Abnormal; Notable for the following:    Squamous Epithelial / LPF 6-30 (*)    Bacteria, UA MANY (*)    All other components within normal limits  URINE CULTURE  PREGNANCY, URINE  CBC WITH DIFFERENTIAL/PLATELET    Imaging Review No results found.   I have personally reviewed and evaluated these lab results as part of my medical decision-making.   EKG Interpretation None      MDM   Final diagnoses:  UTI (lower urinary tract infection)    35 year old female presents with low back pain, dysuria, urgency, and frequency. States she has a history of UTI and that her symptoms feel similar. Denies fever, though reports chills. Denies abdominal pain, N/V/D/C, numbness, weakness, paresthesia, bowel or bladder incontinence, saddle anesthesia.   Patient is afebrile. Vital signs stable. Abdomen soft, non-tender, non-distended. No rebound, guarding, or masses. No CVA tenderness. No TTP to thoracic or lumbar spine or paraspinal muscles. Strength, sensation, DTRs intact.  UA remarkable for small leukocytes, 6-30 WBC, many bacteria. Urine culture  ordered. Given the patient complains of urinary symptoms and has signs of infection on UA, will treat with keflex. BMP unremarkable. CBC sent, though lab did not have enough blood to run. Advised patient we need to redraw CBC, however she declined and states she needs to go home. Patient is non-toxic and well-appearing, feel she is stable for discharge at this time. Will treat with keflex. Patient to follow-up with PCP. Strict return precautions discussed. Patient verbalizes her understanding and is in agreement with plan.  BP 117/75 mmHg  Pulse 83  Temp(Src) 98 F (36.7 C) (Oral)  Resp 20  Ht 5\' 10"  (1.778 m)  Wt 77.111 kg  BMI 24.39 kg/m2  SpO2 98%  LMP 12/19/2015 (Approximate)     Marella Chimes, PA-C 01/07/16 [facility], DO 01/07/16 2353

## 2016-01-08 LAB — URINE CULTURE

## 2016-01-09 ENCOUNTER — Telehealth: Payer: Self-pay | Admitting: *Deleted

## 2016-01-09 NOTE — ED Notes (Signed)
Post ED Visit - Positive Culture Follow-up  Culture report reviewed by antimicrobial stewardship pharmacist:  []  Elenor Quinones, Pharm.D. []  Heide Guile, Pharm.D., BCPS []  Parks Neptune, Pharm.D. []  Alycia Rossetti, Pharm.D., BCPS []  [place], [place].D., BCPS, AAHIVP [x]  Legrand Como, Pharm.D., BCPS, AAHIVP []  Milus Glazier, Pharm.D. []  Stephens November, [place].D.  Positive urine culture Treated with Cephalexin, organism sensitive to the same and no further patient follow-up is required at this time.  Harlon Flor [facility] 01/09/2016, 10:14 AM

## 2016-02-22 ENCOUNTER — Encounter (HOSPITAL_COMMUNITY): Payer: Self-pay | Admitting: *Deleted

## 2016-02-22 DIAGNOSIS — Z87440 Personal history of urinary (tract) infections: Secondary | ICD-10-CM | POA: Diagnosis not present

## 2016-02-22 DIAGNOSIS — F17210 Nicotine dependence, cigarettes, uncomplicated: Secondary | ICD-10-CM | POA: Insufficient documentation

## 2016-02-22 DIAGNOSIS — R51 Headache: Secondary | ICD-10-CM | POA: Insufficient documentation

## 2016-02-22 DIAGNOSIS — Z792 Long term (current) use of antibiotics: Secondary | ICD-10-CM | POA: Diagnosis not present

## 2016-02-22 DIAGNOSIS — R5383 Other fatigue: Secondary | ICD-10-CM | POA: Diagnosis not present

## 2016-02-22 DIAGNOSIS — Z9889 Other specified postprocedural states: Secondary | ICD-10-CM | POA: Diagnosis not present

## 2016-02-22 DIAGNOSIS — Z8742 Personal history of other diseases of the female genital tract: Secondary | ICD-10-CM | POA: Diagnosis not present

## 2016-02-22 DIAGNOSIS — M545 Low back pain: Secondary | ICD-10-CM | POA: Diagnosis not present

## 2016-02-22 DIAGNOSIS — R350 Frequency of micturition: Secondary | ICD-10-CM | POA: Diagnosis not present

## 2016-02-22 DIAGNOSIS — Z3202 Encounter for pregnancy test, result negative: Secondary | ICD-10-CM | POA: Insufficient documentation

## 2016-02-22 DIAGNOSIS — R109 Unspecified abdominal pain: Secondary | ICD-10-CM | POA: Diagnosis not present

## 2016-02-22 DIAGNOSIS — R300 Dysuria: Secondary | ICD-10-CM | POA: Diagnosis not present

## 2016-02-22 LAB — COMPREHENSIVE METABOLIC PANEL
ALT: 23 U/L (ref 14–54)
AST: 23 U/L (ref 15–41)
Albumin: 4 g/dL (ref 3.5–5.0)
Alkaline Phosphatase: 54 U/L (ref 38–126)
Anion gap: 6 (ref 5–15)
BUN: 8 mg/dL (ref 6–20)
CO2: 26 mmol/L (ref 22–32)
Calcium: 9 mg/dL (ref 8.9–10.3)
Chloride: 106 mmol/L (ref 101–111)
Creatinine, Ser: 0.74 mg/dL (ref 0.44–1.00)
GFR calc Af Amer: >60 mL/min (ref >60)
GFR calc non Af Amer: >60 mL/min (ref >60)
Glucose, Bld: 99 mg/dL (ref 65–99)
Potassium: 3.9 mmol/L (ref 3.5–5.1)
Sodium: 138 mmol/L (ref 135–145)
Total Bilirubin: 0.4 mg/dL (ref 0.3–1.2)
Total Protein: 6.4 g/dL — ABNORMAL LOW (ref 6.5–8.1)

## 2016-02-22 LAB — URINALYSIS, ROUTINE W REFLEX MICROSCOPIC
Bilirubin Urine: NEGATIVE
Glucose, UA: NEGATIVE mg/dL
Hgb urine dipstick: NEGATIVE
Ketones, ur: NEGATIVE mg/dL
Leukocytes, UA: NEGATIVE
Nitrite: NEGATIVE
Protein, ur: NEGATIVE mg/dL
Specific Gravity, Urine: 1.011 (ref 1.005–1.030)
pH: 6 (ref 5.0–8.0)

## 2016-02-22 LAB — CBC
HCT: 41.7 % (ref 36.0–46.0)
Hemoglobin: 13.7 g/dL (ref 12.0–15.0)
MCH: 29.3 pg (ref 26.0–34.0)
MCHC: 32.9 g/dL (ref 30.0–36.0)
MCV: 89.1 fL (ref 78.0–100.0)
Platelets: 161 10*3/uL (ref 150–400)
RBC: 4.68 MIL/uL (ref 3.87–5.11)
RDW: 13.1 % (ref 11.5–15.5)
WBC: 7.7 10*3/uL (ref 4.0–10.5)

## 2016-02-22 LAB — POC URINE PREG, ED: Preg Test, Ur: NEGATIVE

## 2016-02-22 LAB — LIPASE, BLOOD: LIPASE: 34 U/L (ref 11–51)

## 2016-02-22 NOTE — ED Notes (Signed)
The pt thinks she has a uti  She is c/o  Lower back pain headache for several days  lmp one week ago  She smells like old alcohol

## 2016-02-22 NOTE — ED Notes (Signed)
Pt talking on cell phone unable to triage  Until she finishes her conversation

## 2016-02-23 ENCOUNTER — Emergency Department (HOSPITAL_COMMUNITY)
Admission: EM | Admit: 2016-02-23 | Discharge: 2016-02-23 | Disposition: A | Discharge status: Home / Self Care | Payer: Medicaid Other | Attending: Emergency Medicine | Admitting: Emergency Medicine

## 2016-02-23 DIAGNOSIS — M545 Low back pain, unspecified: Secondary | ICD-10-CM

## 2016-02-23 DIAGNOSIS — R300 Dysuria: Secondary | ICD-10-CM

## 2016-02-23 MED ORDER — AMPICILLIN 250 MG PO CAPS: 250.0000 mg | ORAL_CAPSULE | Freq: Four times a day (QID) | ORAL | Status: DC

## 2016-02-23 NOTE — ED Provider Notes (Signed)
CSN: [id number]     Arrival date & time 02/22/16  2244 History  By signing my name below, I, Stephanie [name], attest that this documentation has been prepared under the direction and in the presence of Stephanie Greek, MD. Electronically Signed: Judithann [name], ED Scribe. 02/23/2016. 1:44 AM.     Chief Complaint  Patient presents with  . Flank Pain   Patient is a 35 y.o. female presenting with flank pain. The history is provided by the patient. No language interpreter was used.  Flank Pain This is a new problem. The current episode started more than 2 days ago. The problem has been gradually worsening. Associated symptoms include headaches. Pertinent negatives include no chest pain, no abdominal pain and no shortness of breath. Nothing aggravates the symptoms. Nothing relieves the symptoms. She has tried a cold compress and a warm compress for the symptoms.   HPI Comments: Stephanie [name] is a 35 y.o. female with a hx of UTI who presents to the Emergency Department complaining of gradually worsening lower back pain onset several days ago. She reports associated urinary frequency, fatigue, and generalized HA. Pt states that she has had recurrent UTIs and although she recently finished a course of Keflex, she believes she may currently have another UTI. No alleviating factors noted. Pt has not tried any oral medications PTA but has tried icy hot on her lower back. She reports NKDA. No fever, chills, hematuria, or n/v.    Past Medical History  Diagnosis Date  . UTI (lower urinary tract infection)   . BV (bacterial vaginosis)    Past Surgical History  Procedure Laterality Date  . Cesarean section     Family History  Problem Relation Age of Onset  . Heart disease Father    Social History  Substance Use Topics  . Smoking status: Current Some Day Smoker -- 0.50 packs/day for 10 years    Types: Cigarettes  . Smokeless tobacco: Never Used  . Alcohol Use: No     Comment: seldom    OB History    Gravida Para Term Preterm AB TAB SAB Ectopic Multiple Living   3 1 1  1 1    1      Review of Systems  Constitutional: Negative for fever and chills.  Respiratory: Negative for shortness of breath.   Cardiovascular: Negative for chest pain.  Gastrointestinal: Negative for nausea, vomiting and abdominal pain.  Genitourinary: Positive for frequency and flank pain. Negative for hematuria.  Neurological: Positive for headaches.  All other systems reviewed and are negative.     Allergies  Review of patient's allergies indicates no known allergies.  Home Medications   Prior to Admission medications   Medication Sig Start Date End Date Taking? Authorizing Provider  ampicillin (PRINCIPEN) 250 MG capsule Take 1 capsule (250 mg total) by mouth 4 (four) times daily. 02/23/16   Stephanie Greek, MD  cephALEXin (KEFLEX) 500 MG capsule Take 1 capsule (500 mg total) by mouth 3 (three) times daily. 01/06/16   Marella Chimes, PA-C  ibuprofen (ADVIL,MOTRIN) 200 MG tablet Take 400 mg by mouth every 6 (six) hours as needed (for pain.).    Historical Provider, MD  metroNIDAZOLE (FLAGYL) 500 MG tablet Take 1 tablet (500 mg total) by mouth 2 (two) times daily. Patient not taking: Reported on 01/06/2016 10/05/15   Billy Fischer, MD  naproxen (NAPROSYN) 500 MG tablet Take 1 tablet (500 mg total) by mouth 2 (two) times daily. Patient not taking: Reported  on 01/06/2016 10/31/15   Hope Bunnie Pion, NP   BP 111/67 mmHg  Pulse 59  Temp(Src) 97.3 F (36.3 C)  Resp 18  Wt 181 lb 9 oz (82.356 kg)  SpO2 97%  LMP 02/15/2016 Physical Exam  Constitutional: She is oriented to person, place, and time. She appears well-developed and well-nourished. No distress.  HENT:  Head: Normocephalic and atraumatic.  Right Ear: Hearing normal.  Left Ear: Hearing normal.  Nose: Nose normal.  Mouth/Throat: Oropharynx is clear and moist and mucous membranes are normal.  Eyes: Conjunctivae and EOM are normal.  Pupils are equal, round, and reactive to light.  Neck: Normal range of motion. Neck supple.  Cardiovascular: Regular rhythm, S1 normal and S2 normal.  Exam reveals no gallop and no friction rub.   No murmur heard. Pulmonary/Chest: Effort normal and breath sounds normal. No respiratory distress. She exhibits no tenderness.  Abdominal: Soft. Normal appearance and bowel sounds are normal. There is no hepatosplenomegaly. There is no tenderness. There is no rebound, no guarding, no tenderness at McBurney's point and negative Murphy's sign. No hernia.  Musculoskeletal: Normal range of motion. She exhibits tenderness.  Mild lower back tenderness  Neurological: She is alert and oriented to person, place, and time. She has normal strength. No cranial nerve deficit or sensory deficit. Coordination normal. GCS eye subscore is 4. GCS verbal subscore is 5. GCS motor subscore is 6.  Skin: Skin is warm, dry and intact. No rash noted. No cyanosis.  Psychiatric: She has a normal mood and affect. Her speech is normal and behavior is normal. Thought content normal.  Nursing note and vitals reviewed.   ED Course  Procedures (including critical care time) DIAGNOSTIC STUDIES: Oxygen Saturation is 100% on RA, normal by my interpretation.    COORDINATION OF CARE: 1:30 AM- Pt advised of plan for treatment and pt agrees. Pt informed of lab results. Explained that since pt is still experiencing symptoms, she will receive Penicillin. Will provide resources for OB GYN follow up.    Labs Review Labs Reviewed  COMPREHENSIVE METABOLIC PANEL - Abnormal; Notable for the following:    Total Protein 6.4 (*)    All other components within normal limits  LIPASE, BLOOD  CBC  URINALYSIS, ROUTINE W REFLEX MICROSCOPIC (NOT AT [facility])  POC URINE PREG, ED    Imaging Review No results found.   Stephanie Greek, MD has personally reviewed and evaluated these images and lab results as part of his medical  decision-making.   EKG Interpretation None      MDM   Final diagnoses:  Bilateral low back pain without sciatica  Dysuria    Patient presents to the emergency department with persistent low back pain, urinary frequency and dysuria. Patient reports that she was treated for urinary tract infection last month but the symptoms have never resolved. Reviewing her records from previous visit reveals a fairly benign urinalysis blood culture positive for group B strep. She was treated with Keflex. This might cover group B strep, however, with persistent symptoms, consider persistent infection. Will treat with ampicillin, follow-up with OB/GYN for further evaluation.  I personally performed the services described in this documentation, which was scribed in my presence. The recorded information has been reviewed and is accurate.   Stephanie Greek, MD 02/23/16 [phone]

## 2016-02-23 NOTE — ED Notes (Signed)
Patient not answering questions

## 2016-02-23 NOTE — Discharge Instructions (Signed)

## 2016-02-23 NOTE — ED Notes (Signed)
Patient talking on phone and not paying attention to this nurse.

## 2016-02-23 NOTE — ED Notes (Signed)
Unable to do good assessment.  Patient remained on the phone the entire time

## 2016-02-23 NOTE — ED Notes (Signed)
Pt ambulated to room with steady gait, NAD. She refused to put on hospital gown.

## 2016-03-04 ENCOUNTER — Encounter (HOSPITAL_COMMUNITY): Payer: Self-pay | Admitting: Emergency Medicine

## 2016-03-04 ENCOUNTER — Ambulatory Visit (HOSPITAL_COMMUNITY)
Admission: EM | Admit: 2016-03-04 | Discharge: 2016-03-04 | Disposition: A | Discharge status: Home / Self Care | Payer: Medicaid Other | Attending: Emergency Medicine | Admitting: Emergency Medicine

## 2016-03-04 DIAGNOSIS — B3731 Acute candidiasis of vulva and vagina: Secondary | ICD-10-CM

## 2016-03-04 DIAGNOSIS — N72 Inflammatory disease of cervix uteri: Secondary | ICD-10-CM

## 2016-03-04 DIAGNOSIS — N739 Female pelvic inflammatory disease, unspecified: Secondary | ICD-10-CM | POA: Diagnosis not present

## 2016-03-04 DIAGNOSIS — N898 Other specified noninflammatory disorders of vagina: Secondary | ICD-10-CM

## 2016-03-04 DIAGNOSIS — N730 Acute parametritis and pelvic cellulitis: Secondary | ICD-10-CM

## 2016-03-04 DIAGNOSIS — B373 Candidiasis of vulva and vagina: Secondary | ICD-10-CM | POA: Diagnosis not present

## 2016-03-04 DIAGNOSIS — F17210 Nicotine dependence, cigarettes, uncomplicated: Secondary | ICD-10-CM | POA: Diagnosis not present

## 2016-03-04 LAB — POCT URINALYSIS DIP (DEVICE)
Bilirubin Urine: NEGATIVE
GLUCOSE, UA: NEGATIVE mg/dL
Ketones, ur: NEGATIVE mg/dL
Nitrite: NEGATIVE
PH: 7 (ref 5.0–8.0)
PROTEIN: NEGATIVE mg/dL
SPECIFIC GRAVITY, URINE: 1.015 (ref 1.005–1.030)
UROBILINOGEN UA: 0.2 mg/dL (ref 0.0–1.0)

## 2016-03-04 LAB — POCT PREGNANCY, URINE: Preg Test, Ur: NEGATIVE

## 2016-03-04 MED ORDER — AZITHROMYCIN 250 MG PO TABS
ORAL_TABLET | ORAL | Status: AC
  Filled 2016-03-04: qty 4

## 2016-03-04 MED ORDER — FLUCONAZOLE 150 MG PO TABS: ORAL_TABLET | ORAL | Status: DC

## 2016-03-04 MED ORDER — METRONIDAZOLE 500 MG PO TABS: 500.0000 mg | ORAL_TABLET | Freq: Two times a day (BID) | ORAL | Status: DC

## 2016-03-04 MED ORDER — CEFTRIAXONE SODIUM 250 MG IJ SOLR
INTRAMUSCULAR | Status: AC
  Filled 2016-03-04: qty 250

## 2016-03-04 MED ORDER — AZITHROMYCIN 250 MG PO TABS
1000.0000 mg | ORAL_TABLET | Freq: Once | ORAL | Status: AC
  Administered 2016-03-04: 1000 mg via ORAL

## 2016-03-04 MED ORDER — CEFTRIAXONE SODIUM 250 MG IJ SOLR
250.0000 mg | Freq: Once | INTRAMUSCULAR | Status: AC
  Administered 2016-03-04: 250 mg via INTRAMUSCULAR

## 2016-03-04 NOTE — Discharge Instructions (Signed)
Cervicitis Cervicitis is a soreness and puffiness (inflammation) of the cervix.  HOME CARE  Do not have sex (intercourse) until your doctor says it is okay.  Do not have sex until your partner is treated or as told by your doctor.  Take your antibiotic medicine as told. Finish it even if you start to feel better. GET HELP IF:   Your symptoms that brought you to the doctor come back.  You have a fever. MAKE SURE YOU:   Understand these instructions.  Will watch your condition.  Will get help right away if you are not doing well or get worse.   This information is not intended to replace advice given to you by your health care provider. Make sure you discuss any questions you have with your health care provider.   Document Released: 06/24/2008 Document Revised: 09/20/2013 Document Reviewed: 03/09/2013 Elsevier Interactive Patient Education 2016 Elsevier Inc.  Pelvic Inflammatory Disease Pelvic inflammatory disease (PID) refers to an infection in some or all of the female organs. The infection can be in the uterus, ovaries, fallopian tubes, or the surrounding tissues in the pelvis. PID can cause abdominal or pelvic pain that comes on suddenly (acute pelvic pain). PID is a serious infection because it can lead to lasting (chronic) pelvic pain or the inability to have children (infertility). CAUSES This condition is most often caused by an infection that is spread during sexual contact. However, the infection can also be caused by the normal bacteria that are found in the vaginal tissues if these bacteria travel upward into the reproductive organs. PID can also occur following:  The birth of a baby.  A miscarriage.  An abortion.  Major pelvic surgery.  The use of an intrauterine device (IUD).  A sexual assault. RISK FACTORS This condition is more likely to develop in women who:  Are younger than 36 years of age.  Are sexually active at [facility] age.  Use nonbarrier  contraception.  Have multiple sexual partners.  Have sex with someone who has symptoms of an STD (sexually transmitted disease).  Use oral contraception. At times, certain behaviors can also increase the possibility of getting PID, such as:  Using a vaginal douche.  Having an IUD in place. SYMPTOMS Symptoms of this condition include:  Abdominal or pelvic pain.  Fever.  Chills.  Abnormal vaginal discharge.  Abnormal uterine bleeding.  Unusual pain shortly after the end of a menstrual period.  Painful urination.  Pain with sexual intercourse.  Nausea and vomiting. DIAGNOSIS To diagnose this condition, your health care provider will do a physical exam and take your medical history. A pelvic exam typically reveals great tenderness in the uterus and the surrounding pelvic tissues. You may also have tests, such as:  Lab tests, including a pregnancy test, blood tests, and urine test.  Culture tests of the vagina and cervix to check for an STD.  Ultrasound.  A laparoscopic procedure to look inside the pelvis.  Examining vaginal secretions under a microscope. TREATMENT Treatment for this condition may involve one or more approaches.  Antibiotic medicines may be prescribed to be taken by mouth.  Sexual partners may need to be treated if the infection is caused by an STD.  For more severe cases, hospitalization may be needed to give antibiotics directly into a vein through an IV tube.  Surgery may be needed if other treatments do not help, but this is rare. It may take weeks until you are completely well. If you are diagnosed  with PID, you should also be checked for human immunodeficiency virus (HIV). Your health care provider may test you for infection again 3 months after treatment. You should not have unprotected sex. HOME CARE INSTRUCTIONS  Take over-the-counter and prescription medicines only as told by your health care provider.  If you were prescribed an  antibiotic medicine, take it as told by your health care provider. Do not stop taking the antibiotic even if you start to feel better.  Do not have sexual intercourse until treatment is completed or as told by your health care provider. If PID is confirmed, your recent sexual partners will need treatment, especially if you had unprotected sex.  Keep all follow-up visits as told by your health care provider. This is important. SEEK MEDICAL CARE IF:  You have increased or abnormal vaginal discharge.  Your pain does not improve.  You vomit.  You have a fever.  You cannot tolerate your medicines.  Your partner has an STD.  You have pain when you urinate. SEEK IMMEDIATE MEDICAL CARE IF:  You have increased abdominal or pelvic pain.  You have chills.  Your symptoms are not better in 72 hours even with treatment.   This information is not intended to replace advice given to you by your health care provider. Make sure you discuss any questions you have with your health care provider.   Document Released: 09/15/2005 Document Revised: 06/06/2015 Document Reviewed: 10/23/2014 Elsevier Interactive Patient Education 2016 Elsevier Inc.  Monilial Vaginitis Vaginitis in a soreness, swelling and redness (inflammation) of the vagina and vulva. Monilial vaginitis is not a sexually transmitted infection. CAUSES  Yeast vaginitis is caused by yeast (candida) that is normally found in your vagina. With a yeast infection, the candida has overgrown in number to a point that upsets the chemical balance. SYMPTOMS   White, thick vaginal discharge.  Swelling, itching, redness and irritation of the vagina and possibly the lips of the vagina (vulva).  Burning or painful urination.  Painful intercourse. DIAGNOSIS  Things that may contribute to monilial vaginitis are:  Postmenopausal and virginal states.  Pregnancy.  Infections.  Being tired, sick or stressed, especially if you had monilial  vaginitis in the past.  Diabetes. Good control will help lower the chance.  Birth control pills.  Tight fitting garments.  Using bubble bath, feminine sprays, douches or deodorant tampons.  Taking certain medications that kill germs (antibiotics).  Sporadic recurrence can occur if you become ill. TREATMENT  Your caregiver will give you medication.  There are several kinds of anti monilial vaginal creams and suppositories specific for monilial vaginitis. For recurrent yeast infections, use a suppository or cream in the vagina 2 times a week, or as directed.  Anti-monilial or steroid cream for the itching or irritation of the vulva may also be used. Get your caregiver's permission.  Painting the vagina with methylene blue solution may help if the monilial cream does not work.  Eating yogurt may help prevent monilial vaginitis. HOME CARE INSTRUCTIONS   Finish all medication as prescribed.  Do not have sex until treatment is completed or after your caregiver tells you it is okay.  Take warm sitz baths.  Do not douche.  Do not use tampons, especially scented ones.  Wear cotton underwear.  Avoid tight pants and panty hose.  Tell your sexual partner that you have a yeast infection. They should go to their caregiver if they have symptoms such as mild rash or itching.  Your sexual partner should be  treated as well if your infection is difficult to eliminate.  Practice safer sex. Use condoms.  Some vaginal medications cause latex condoms to fail. Vaginal medications that harm condoms are:  Cleocin cream.  Butoconazole (Femstat).  Terconazole (Terazol) vaginal suppository.  Miconazole (Monistat) (may be purchased over the counter). SEEK MEDICAL CARE IF:   You have a temperature by mouth above 102 F (38.9 C).  The infection is getting worse after 2 days of treatment.  The infection is not getting better after 3 days of treatment.  You develop blisters in or  around your vagina.  You develop vaginal bleeding, and it is not your menstrual period.  You have pain when you urinate.  You develop intestinal problems.  You have pain with sexual intercourse.   This information is not intended to replace advice given to you by your health care provider. Make sure you discuss any questions you have with your health care provider.   Document Released: 06/25/2005 Document Revised: 12/08/2011 Document Reviewed: 03/19/2015 Elsevier Interactive Patient Education 2016 [employer].  Sexually Transmitted Disease A sexually transmitted disease (STD) is a disease or infection often passed to another person during sex. However, STDs can be passed through nonsexual ways. An STD can be passed through:  Spit (saliva).  Semen.  Blood.  Mucus from the vagina.  Pee (urine). HOW CAN I LESSEN MY CHANCES OF GETTING AN STD?  Use:  Latex condoms.  Water-soluble lubricants with condoms. Do not use petroleum jelly or oils.  Dental dams. These are small pieces of latex that are used as a barrier during oral sex.  Avoid having more than one sex partner.  Do not have sex with someone who has other sex partners.  Do not have sex with anyone you do not know or who is at high risk for an STD.  Avoid risky sex that can break your skin.  Do not have sex if you have open sores on your mouth or skin.  Avoid drinking too much alcohol or taking illegal drugs. Alcohol and drugs can affect your good judgment.  Avoid oral and anal sex acts.  Get shots (vaccines) for HPV and hepatitis.  If you are at risk of being infected with HIV, it is advised that you take a certain medicine daily to prevent HIV infection. This is called pre-exposure prophylaxis (PrEP). You may be at risk if:  You are a man who has sex with other men (MSM).  You are attracted to the opposite sex (heterosexual) and are having sex with more than one partner.  You take drugs with a  needle.  You have sex with someone who has HIV.  Talk with your doctor about if you are at high risk of being infected with HIV. If you begin to take PrEP, get tested for HIV first. Get tested every 3 months for as long as you are taking PrEP.  Get tested for STDs every year if you are sexually active. If you are treated for an STD, get tested again 3 months after you are treated. WHAT SHOULD I DO IF I THINK I HAVE AN STD?  See your doctor.  Tell your sex partner(s) that you have an STD. They should be tested and treated.  Do not have sex until your doctor says it is okay. WHEN SHOULD I GET HELP? Get help right away if:  You have bad belly (abdominal) pain.  You are a man and have puffiness (swelling) or pain in your testicles.  You are a woman and have puffiness in your vagina.   This information is not intended to replace advice given to you by your health care provider. Make sure you discuss any questions you have with your health care provider.   Document Released: 10/23/2004 Document Revised: 10/06/2014 Document Reviewed: 03/11/2013 Elsevier Interactive Patient Education [employer].

## 2016-03-04 NOTE — ED Provider Notes (Signed)
CSN: [id number]     Arrival date & time 03/04/16  1601 History   First MD Initiated Contact with Patient 03/04/16 1650     Chief Complaint  Patient presents with  . Exposure to STD   (Consider location/radiation/quality/duration/timing/severity/associated sxs/prior Treatment) HPI Comments: 35 year old female complaining of vaginal discharge, vulvovaginal itching, vaginal irritation for 2 days. She is also having urinary frequency and dysuria. She was recently treated for a UTI with Keflex. Culture grew out strep B and she was continuing to have urinary symptoms. The antibiotic and been changed to ampicillin via the prescriber from the emergency department. She is 19 and of that course. She has been sexually active with a new partner the last contact being 2 weeks ago. The new symptoms that began a couple days ago are particularly worrisome for her and she wants to be checked for STD.   Past Medical History  Diagnosis Date  . UTI (lower urinary tract infection)   . BV (bacterial vaginosis)    Past Surgical History  Procedure Laterality Date  . Cesarean section     Family History  Problem Relation Age of Onset  . Heart disease Father    Social History  Substance Use Topics  . Smoking status: Current Some Day Smoker -- 0.50 packs/day for 10 years    Types: Cigarettes  . Smokeless tobacco: Never Used  . Alcohol Use: No     Comment: seldom   OB History    Gravida Para Term Preterm AB TAB SAB Ectopic Multiple Living   3 1 1  1 1    1      Review of Systems  Constitutional: Negative.  Negative for fever.  HENT: Negative.   Respiratory: Negative.   Genitourinary: Positive for frequency, vaginal discharge, vaginal pain and pelvic pain. Negative for dysuria and menstrual problem.  Musculoskeletal: Negative.   Skin: Negative.   All other systems reviewed and are negative.   Allergies  Review of patient's allergies indicates no known allergies.  Home Medications   Prior to  Admission medications   Medication Sig Start Date End Date Taking? Authorizing Provider  ampicillin (PRINCIPEN) 250 MG capsule Take 1 capsule (250 mg total) by mouth 4 (four) times daily. 02/23/16  Yes Orpah Greek, MD  cephALEXin (KEFLEX) 500 MG capsule Take 1 capsule (500 mg total) by mouth 3 (three) times daily. 01/06/16   Marella Chimes, PA-C  fluconazole (DIFLUCAN) 150 MG tablet 1 tab po x 1. May repeat in 72 hours if no improvement 03/04/16   Janne Napoleon, NP  ibuprofen (ADVIL,MOTRIN) 200 MG tablet Take 400 mg by mouth every 6 (six) hours as needed (for pain.).    Historical Provider, MD  metroNIDAZOLE (FLAGYL) 500 MG tablet Take 1 tablet (500 mg total) by mouth 2 (two) times daily. X 7 days 03/04/16   Janne Napoleon, NP  naproxen (NAPROSYN) 500 MG tablet Take 1 tablet (500 mg total) by mouth 2 (two) times daily. Patient not taking: Reported on 01/06/2016 10/31/15   Ashley Murrain, NP   Meds Ordered and Administered this Visit   Medications  cefTRIAXone (ROCEPHIN) injection 250 mg (not administered)  azithromycin (ZITHROMAX) tablet 1,000 mg (not administered)    BP 120/75 mmHg  Pulse 61  Temp(Src) 98.8 F (37.1 C) (Oral)  Resp 16  SpO2 100%  LMP 02/12/2016 No data found.   Physical Exam  Constitutional: She is oriented to person, place, and time. She appears well-developed and well-nourished. No distress.  Eyes:  EOM are normal.  Neck: Normal range of motion. Neck supple.  Cardiovascular: Normal rate.   Pulmonary/Chest: Effort normal. No respiratory distress.  Abdominal: Soft.  Mild tenderness across the anterior pelvis.  Genitourinary: Vaginal discharge found.  Normal external female genitalia. The vulva with erythema and small specks of white discharge. Also with tenderness. The vaginal walls, cervix and vaginal vault are filled and coated with a thick white cottage cheese discharge as well as a a more thin white to green discharge. Cervix is midline with patchy erythema.  Ectocervix with erythema. Positive for CMT and bilateral adnexal tenderness.    Musculoskeletal: She exhibits no edema.  Neurological: She is alert and oriented to person, place, and time. She exhibits normal muscle tone.  Skin: Skin is warm and dry.  Psychiatric: She has a normal mood and affect.  Nursing note and vitals reviewed.   ED Course  Procedures (including critical care time)  Labs Review Labs Reviewed  POCT URINALYSIS DIP (DEVICE) - Abnormal; Notable for the following:    Hgb urine dipstick TRACE (*)    Leukocytes, UA TRACE (*)    All other components within normal limits  URINE CULTURE  POCT PREGNANCY, URINE  CERVICOVAGINAL ANCILLARY ONLY   Results for orders placed or performed during the hospital encounter of 03/04/16  POCT urinalysis dip (device)  Result Value Ref Range   Glucose, UA NEGATIVE NEGATIVE mg/dL   Bilirubin Urine NEGATIVE NEGATIVE   Ketones, ur NEGATIVE NEGATIVE mg/dL   Specific Gravity, Urine 1.015 1.005 - 1.030   Hgb urine dipstick TRACE (A) NEGATIVE   pH 7.0 5.0 - 8.0   Protein, ur NEGATIVE NEGATIVE mg/dL   Urobilinogen, UA 0.2 0.0 - 1.0 mg/dL   Nitrite NEGATIVE NEGATIVE   Leukocytes, UA TRACE (A) NEGATIVE  Pregnancy, urine POC  Result Value Ref Range   Preg Test, Ur NEGATIVE NEGATIVE     Imaging Review No results found.   Visual Acuity Review  Right Eye Distance:   Left Eye Distance:   Bilateral Distance:    Right Eye Near:   Left Eye Near:    Bilateral Near:         MDM   1. Vaginal discharge   2. PID (acute pelvic inflammatory disease)   3. Cervicitis   4. Candidal vulvovaginitis    Meds ordered this encounter  Medications  . cefTRIAXone (ROCEPHIN) injection 250 mg    Sig:   . azithromycin (ZITHROMAX) tablet 1,000 mg    Sig:   . fluconazole (DIFLUCAN) 150 MG tablet    Sig: 1 tab po x 1. May repeat in 72 hours if no improvement    Dispense:  2 tablet    Refill:  0    Order Specific Question:  Supervising  Provider    Answer:  Melony Overly [id number]  . metroNIDAZOLE (FLAGYL) 500 MG tablet    Sig: Take 1 tablet (500 mg total) by mouth 2 (two) times daily. X 7 days    Dispense:  14 tablet    Refill:  0    Order Specific Question:  Supervising Provider    Answer:  Melony Overly [id number]   Urine cult and cervical ancillary pending Ibuprofen for pain Instruction sheets for diagnoses    Janne Napoleon, NP 03/04/16 1802

## 2016-03-04 NOTE — ED Notes (Signed)
Pt would like to be screened for STDs... Reports she is sexually active and not using condoms... Sx today incude dysuria, vag itching/discharge... Also reports she's on Ampicillin 250 mg for UTI... A&O x4... No acute distress.

## 2016-03-05 LAB — CERVICOVAGINAL ANCILLARY ONLY
Chlamydia: NEGATIVE
Neisseria Gonorrhea: NEGATIVE

## 2016-03-06 LAB — CERVICOVAGINAL ANCILLARY ONLY: Wet Prep (BD Affirm): POSITIVE — AB

## 2016-03-07 LAB — URINE CULTURE: Special Requests: NORMAL

## 2016-03-08 ENCOUNTER — Telehealth: Payer: Self-pay | Admitting: Internal Medicine

## 2016-03-08 MED ORDER — CIPROFLOXACIN HCL 500 MG PO TABS: 500.0000 mg | ORAL_TABLET | Freq: Two times a day (BID) | ORAL | Status: DC

## 2016-03-08 NOTE — ED Notes (Signed)
Please let patient know that urine culture was positive for E coli sensitive to cipro; will send rx to pharmacy of record, [facility] at [address] and [address].   Test for candida (yeast) was positive; finish rx for fluconazole given at [facility] visit 03/04/16.   Tests for gonorrhea/chlamydia were negative.  Recheck as needed for persistent symptoms.  LM  Sherlene Shams, MD 03/08/16 [phone]

## 2016-03-11 ENCOUNTER — Telehealth (HOSPITAL_COMMUNITY): Payer: Self-pay | Admitting: Emergency Medicine

## 2016-03-11 NOTE — ED Notes (Signed)
Called pt and notified of recent lab results from visit 6/6 Pt ID'd properly... Reports feeling better and sx have subsided  Per Dr. Valere Dross,  Notes Recorded by Sherlene Shams, MD on 03/08/2016 at 6:12 PM Please let patient know that urine culture was positive for E coli sensitive to cipro; will send rx to pharmacy of record, [facility] at [address] and [address].  Test for candida (yeast) was positive; finish rx for fluconazole given at [facility] visit 03/04/16.  Tests for gonorrhea/chlamydia were negative. Recheck as needed for persistent symptoms. LM  Reports she will p/u Rx as soon as she can for UTI.  Adv pt if sx are not getting better to return  Pt verb understanding Education on safe sex given

## 2016-05-21 ENCOUNTER — Encounter (HOSPITAL_COMMUNITY): Payer: Self-pay | Admitting: *Deleted

## 2016-05-21 ENCOUNTER — Emergency Department (HOSPITAL_COMMUNITY)
Admission: EM | Admit: 2016-05-21 | Discharge: 2016-05-21 | Disposition: A | Discharge status: Home / Self Care | Payer: Medicaid Other | Attending: Emergency Medicine | Admitting: Emergency Medicine

## 2016-05-21 DIAGNOSIS — R21 Rash and other nonspecific skin eruption: Secondary | ICD-10-CM

## 2016-05-21 DIAGNOSIS — F17210 Nicotine dependence, cigarettes, uncomplicated: Secondary | ICD-10-CM | POA: Insufficient documentation

## 2016-05-21 MED ORDER — PREDNISONE 50 MG PO TABS: 50.0000 mg | ORAL_TABLET | Freq: Every day | ORAL | 0 refills | Status: DC

## 2016-05-21 NOTE — ED Triage Notes (Addendum)
Pt c/o rash behind knees and on abdomen x 1 week

## 2016-05-21 NOTE — ED Provider Notes (Signed)
[facility] DEPT Provider Note   CSN: [id number] Arrival date & time: 05/21/16  1924  By signing my name below, I, Jasmyn B. Alexander, attest that this documentation has been prepared under the direction and in the presence of Gloriann Loan, PA-C. Electronically Signed: Tedra Coupe. Sheppard Coil, ED Scribe. 05/21/16. 7:54 PM.  History   Chief Complaint Chief Complaint  Patient presents with  . Rash    HPI HPI Comments: Stephanie [name] is a 35 y.o. female who presents to the Emergency Department complaining of gradually worsening, constant, pruritic rash located on bilateral posterior knees, thighs, and lower abdomen x 1 week. Pt denies any recent use of new detergents or lotions. She reports that she has been applying her daughter's Kenalog cream to areas with mild relief. She states that rash is worsened when she is in the heat. Denies any hx of eczema. Denies any fever, chills, or anaphylaxis.    The history is provided by the patient. No language interpreter was used.    Past Medical History:  Diagnosis Date  . BV (bacterial vaginosis)   . UTI (lower urinary tract infection)     There are no active problems to display for this patient.   Past Surgical History:  Procedure Laterality Date  . CESAREAN SECTION      OB History    Gravida Para Term Preterm AB Living   3 1 1   1 1    SAB TAB Ectopic Multiple Live Births     1             Home Medications    Prior to Admission medications   Medication Sig Start Date End Date Taking? Authorizing Provider  ciprofloxacin (CIPRO) 500 MG tablet Take 1 tablet (500 mg total) by mouth every 12 (twelve) hours. 03/08/16   Sherlene Shams, MD  fluconazole (DIFLUCAN) 150 MG tablet 1 tab po x 1. May repeat in 72 hours if no improvement 03/04/16   Janne Napoleon, NP  ibuprofen (ADVIL,MOTRIN) 200 MG tablet Take 400 mg by mouth every 6 (six) hours as needed (for pain.).    Historical Provider, MD  metroNIDAZOLE (FLAGYL) 500 MG tablet Take 1  tablet (500 mg total) by mouth 2 (two) times daily. X 7 days 03/04/16   Janne Napoleon, NP  naproxen (NAPROSYN) 500 MG tablet Take 1 tablet (500 mg total) by mouth 2 (two) times daily. Patient not taking: Reported on 01/06/2016 10/31/15   Ashley Murrain, NP  predniSONE (DELTASONE) 50 MG tablet Take 1 tablet (50 mg total) by mouth daily. 05/21/16   Gloriann Loan, PA-C    Family History Family History  Problem Relation Age of Onset  . Heart disease Father     Social History Social History  Substance Use Topics  . Smoking status: Current Some Day Smoker    Packs/day: 0.50    Years: 10.00    Types: Cigarettes  . Smokeless tobacco: Never Used  . Alcohol use No     Comment: seldom     Allergies   Review of patient's allergies indicates no known allergies.   Review of Systems Review of Systems  Constitutional: Negative for chills and fever.  Genitourinary: Negative for dysuria and vaginal discharge.  Skin: Positive for rash.  Allergic/Immunologic: Negative for immunocompromised state.  All other systems reviewed and are negative.  Physical Exam Updated Vital Signs BP 112/68 (BP Location: Left Arm)   Pulse 79   Temp 98.5 F (36.9 C) (Oral)   Resp 19  Ht 5\' 10"  (1.778 m)   Wt 77.1 kg   SpO2 98%   BMI 24.39 kg/m   Physical Exam  Constitutional: She is oriented to person, place, and time. She appears well-developed and well-nourished.  Non-toxic appearance. She does not have a sickly appearance. She does not appear ill.  HENT:  Head: Normocephalic and atraumatic.  Mouth/Throat: Oropharynx is clear and moist.  Eyes: Conjunctivae are normal. Pupils are equal, round, and reactive to light.  Neck: Normal range of motion. Neck supple.  Cardiovascular: Normal rate and regular rhythm.   Pulmonary/Chest: Effort normal and breath sounds normal. No accessory muscle usage or stridor. No respiratory distress. She has no wheezes. She has no rhonchi. She has no rales.  Abdominal: Soft. Bowel  sounds are normal. She exhibits no distension. There is no tenderness.  Musculoskeletal: Normal range of motion.  Lymphadenopathy:    She has no cervical adenopathy.  Neurological: She is alert and oriented to person, place, and time.  Speech clear without dysarthria.  Skin: Skin is warm and dry.  Erythematous maculopapular rash throughout b/l lower extremities and lower abdomen without signs of infection. No hives.   Psychiatric: She has a normal mood and affect. Her behavior is normal.   ED Treatments / Results  DIAGNOSTIC STUDIES: Oxygen Saturation is 98% on RA, normal by my interpretation.    COORDINATION OF CARE: 7:54 PM-Discussed treatment plan which includes order of Prednisone with pt at bedside and pt agreed to plan.   Results for orders placed or performed during the hospital encounter of 03/04/16  Urine culture  Result Value Ref Range   Specimen Description URINE, CLEAN CATCH    Special Requests Normal    Culture >=100,000 COLONIES/mL ESCHERICHIA COLI (A)    Report Status 03/07/2016 FINAL    Organism ID, Bacteria ESCHERICHIA COLI (A)       Susceptibility   Escherichia coli - MIC*    AMPICILLIN >=32 RESISTANT Resistant     CEFAZOLIN <=4 SENSITIVE Sensitive     CEFTRIAXONE <=1 SENSITIVE Sensitive     CIPROFLOXACIN <=0.25 SENSITIVE Sensitive     GENTAMICIN <=1 SENSITIVE Sensitive     IMIPENEM <=0.25 SENSITIVE Sensitive     NITROFURANTOIN <=16 SENSITIVE Sensitive     TRIMETH/SULFA <=20 SENSITIVE Sensitive     AMPICILLIN/SULBACTAM 8 SENSITIVE Sensitive     PIP/TAZO <=4 SENSITIVE Sensitive     * >=100,000 COLONIES/mL ESCHERICHIA COLI  POCT urinalysis dip (device)  Result Value Ref Range   Glucose, UA NEGATIVE NEGATIVE mg/dL   Bilirubin Urine NEGATIVE NEGATIVE   Ketones, ur NEGATIVE NEGATIVE mg/dL   Specific Gravity, Urine 1.015 1.005 - 1.030   Hgb urine dipstick TRACE (A) NEGATIVE   pH 7.0 5.0 - 8.0   Protein, ur NEGATIVE NEGATIVE mg/dL   Urobilinogen, UA 0.2 0.0 -  1.0 mg/dL   Nitrite NEGATIVE NEGATIVE   Leukocytes, UA TRACE (A) NEGATIVE  Pregnancy, urine POC  Result Value Ref Range   Preg Test, Ur NEGATIVE NEGATIVE  Cervicovaginal ancillary only  Result Value Ref Range   Chlamydia Negative    Neisseria gonorrhea Negative   Cervicovaginal ancillary only  Result Value Ref Range   Wet Prep (BD Affirm) **POSITIVE for Candida** (A)    No results found.  Procedures Procedures (including critical care time)  Initial Impression / Assessment and Plan / ED Course  I have reviewed the triage vital signs and the nursing notes.  Pertinent labs & imaging results that were available during my  care of the patient were reviewed by me and considered in my medical decision making (see chart for details).  Clinical Course   Patient with maculopapular eruption. No specific trigger. Discussed that urticaria can be trigger by stress heat cold and for unknown reasons. No signs of anaphylactic reaction; no new medications. Will treat with prednisone. Follow up with PCP in 2-3 days. Return precautions discussed. Pt is safe for discharge at this time.    Triage note stated patient was requesting UA; however, patient denied any urinary symptoms or requests for UA during H&P.   Final Clinical Impressions(s) / ED Diagnoses   Final diagnoses:  Rash    New Prescriptions Discharge Medication List as of 05/21/2016  7:55 PM    START taking these medications   Details  predniSONE (DELTASONE) 50 MG tablet Take 1 tablet (50 mg total) by mouth daily., Starting Wed 05/21/2016, Print       I personally performed the services described in this documentation, which was scribed in my presence. The recorded information has been reviewed and is accurate.     Gloriann Loan, PA-C 05/21/16 2005    Drenda Freeze, MD 05/24/16 2102

## 2016-05-21 NOTE — ED Triage Notes (Signed)
Pt reports frequent UTIs and would like to have urine tested. Denies dysuria or discharge

## 2016-06-21 ENCOUNTER — Ambulatory Visit (HOSPITAL_COMMUNITY)
Admission: EM | Admit: 2016-06-21 | Discharge: 2016-06-21 | Disposition: A | Discharge status: Home / Self Care | Payer: Medicaid Other | Attending: Internal Medicine | Admitting: Internal Medicine

## 2016-06-21 ENCOUNTER — Encounter (HOSPITAL_COMMUNITY): Payer: Self-pay | Admitting: Family Medicine

## 2016-06-21 DIAGNOSIS — N390 Urinary tract infection, site not specified: Secondary | ICD-10-CM | POA: Diagnosis not present

## 2016-06-21 LAB — POCT URINALYSIS DIP (DEVICE)
BILIRUBIN URINE: NEGATIVE
GLUCOSE, UA: NEGATIVE mg/dL
Ketones, ur: NEGATIVE mg/dL
NITRITE: NEGATIVE
PH: 5.5 (ref 5.0–8.0)
PROTEIN: NEGATIVE mg/dL
Specific Gravity, Urine: 1.02 (ref 1.005–1.030)
Urobilinogen, UA: 0.2 mg/dL (ref 0.0–1.0)

## 2016-06-21 MED ORDER — CEPHALEXIN 500 MG PO CAPS: 500.0000 mg | ORAL_CAPSULE | Freq: Four times a day (QID) | ORAL | 0 refills | Status: DC

## 2016-06-21 MED ORDER — ALIGN 4 MG PO CAPS: 1.0000 | ORAL_CAPSULE | Freq: Every day | ORAL | 2 refills | Status: DC

## 2016-06-21 MED ORDER — PHENAZOPYRIDINE HCL 200 MG PO TABS: 200.0000 mg | ORAL_TABLET | Freq: Three times a day (TID) | ORAL | 0 refills | Status: DC

## 2016-06-21 NOTE — Discharge Instructions (Signed)
Follow up with your new primary care provider about frequent urinary tract infections.

## 2016-06-21 NOTE — ED Provider Notes (Signed)
CSN: [id number]     Arrival date & time 06/21/16  1811 History   None    Chief Complaint  Patient presents with  . Urinary Frequency   (Consider location/radiation/quality/duration/timing/severity/associated sxs/prior Treatment)  HPI   The patient is a 35 year old female presenting tonight with complaints of a urinary tract infection. Patient states that she has frequent urinary tract infections approximately once every other month or so. The patient states she gets good results with antibiotics but they always recur. Patient states she has never been evaluated by primary care provider or urologist for this. Patient states she is using appropriate toileting and cleaning habits to prevent infections without success. Patient states she discussed her Medicaid card and will follow up with primary care provider for evaluation and possible referral to urology should persist. Denies other significant medical history.  Past Medical History:  Diagnosis Date  . BV (bacterial vaginosis)   . UTI (lower urinary tract infection)    Past Surgical History:  Procedure Laterality Date  . CESAREAN SECTION     Family History  Problem Relation Age of Onset  . Heart disease Father    Social History  Substance Use Topics  . Smoking status: Current Some Day Smoker    Packs/day: 0.50    Years: 10.00    Types: Cigarettes  . Smokeless tobacco: Never Used  . Alcohol use No     Comment: seldom   OB History    Gravida Para Term Preterm AB Living   3 1 1   1 1    SAB TAB Ectopic Multiple Live Births     1           Review of Systems  Constitutional: Positive for diaphoresis. Negative for chills and fever.       Reports generalized malaise.  HENT: Negative for ear pain and sore throat.   Eyes: Negative for pain and visual disturbance.  Respiratory: Negative for cough and shortness of breath.   Cardiovascular: Negative for chest pain and palpitations.  Gastrointestinal: Positive for abdominal pain.  Negative for vomiting.  Genitourinary: Positive for hematuria and urgency. Negative for difficulty urinating, dysuria and flank pain.  Musculoskeletal: Negative for arthralgias and back pain.  Skin: Negative for color change and rash.  Neurological: Negative for seizures and syncope.  All other systems reviewed and are negative.   Allergies  Review of patient's allergies indicates no known allergies.  Home Medications   Prior to Admission medications   Medication Sig Start Date End Date Taking? Authorizing Provider  cephALEXin (KEFLEX) 500 MG capsule Take 1 capsule (500 mg total) by mouth 4 (four) times daily. 06/21/16   Nehemiah Settle, NP  phenazopyridine (PYRIDIUM) 200 MG tablet Take 1 tablet (200 mg total) by mouth 3 (three) times daily. 06/21/16   Nehemiah Settle, NP  Probiotic Product (ALIGN) 4 MG CAPS Take 1 capsule (4 mg total) by mouth daily. 06/21/16   Nehemiah Settle, NP   Meds Ordered and Administered this Visit  Medications - No data to display  BP 124/78   Pulse 63   Temp 98.4 F (36.9 C)   Resp 18   SpO2 94%  No data found.   Physical Exam  Constitutional: She appears well-developed and well-nourished. No distress.  Cardiovascular: Normal rate, regular rhythm, normal heart sounds and intact distal pulses.  Exam reveals no gallop and no friction rub.   No murmur heard. Pulmonary/Chest: Effort normal and breath sounds normal. No respiratory distress. She has  no wheezes. She has no rales. She exhibits no tenderness.  Abdominal: Soft. Bowel sounds are normal. She exhibits no distension and no mass. There is no tenderness. There is no rebound and no guarding. No hernia.  Negative for CVA tenderness.  Genitourinary: No vaginal discharge found.  Genitourinary Comments: Denies vaginal discharge, itching or odor.  Skin: Skin is warm and dry. No rash noted. She is not diaphoretic.  Nursing note and vitals reviewed.   Urgent Care Course   Clinical Course     Procedures (including critical care time)  Labs Review Labs Reviewed  POCT URINALYSIS DIP (DEVICE) - Abnormal; Notable for the following:       Result Value   Hgb urine dipstick TRACE (*)    Leukocytes, UA MODERATE (*)    All other components within normal limits  URINE CULTURE  URINALYSIS, DIPSTICK ONLY   Results for orders placed or performed during the hospital encounter of 06/21/16  POCT urinalysis dip (device)  Result Value Ref Range   Glucose, UA NEGATIVE NEGATIVE mg/dL   Bilirubin Urine NEGATIVE NEGATIVE   Ketones, ur NEGATIVE NEGATIVE mg/dL   Specific Gravity, Urine 1.020 1.005 - 1.030   Hgb urine dipstick TRACE (A) NEGATIVE   pH 5.5 5.0 - 8.0   Protein, ur NEGATIVE NEGATIVE mg/dL   Urobilinogen, UA 0.2 0.0 - 1.0 mg/dL   Nitrite NEGATIVE NEGATIVE   Leukocytes, UA MODERATE (A) NEGATIVE    Imaging Review No results found.    MDM   1. UTI (lower urinary tract infection)    Meds ordered this encounter  Medications  . cephALEXin (KEFLEX) 500 MG capsule    Sig: Take 1 capsule (500 mg total) by mouth 4 (four) times daily.    Dispense:  20 capsule    Refill:  0  . phenazopyridine (PYRIDIUM) 200 MG tablet    Sig: Take 1 tablet (200 mg total) by mouth 3 (three) times daily.    Dispense:  6 tablet    Refill:  0  . Probiotic Product (ALIGN) 4 MG CAPS    Sig: Take 1 capsule (4 mg total) by mouth daily.    Dispense:  30 capsule    Refill:  2   The usual and customary discharge instructions and warnings were given.  The patient verbalizes understanding and agrees to plan of care.       Nehemiah Settle, NP 06/21/16 2127    Nehemiah Settle, NP 06/21/16 2128

## 2016-06-21 NOTE — ED Triage Notes (Signed)
Pt here for reoccurrence of  UTI.

## 2016-06-27 ENCOUNTER — Emergency Department (HOSPITAL_COMMUNITY): Payer: Medicaid Other

## 2016-06-27 ENCOUNTER — Emergency Department (HOSPITAL_COMMUNITY)
Admission: EM | Admit: 2016-06-27 | Discharge: 2016-06-27 | Disposition: A | Discharge status: Home / Self Care | Payer: Medicaid Other | Attending: Emergency Medicine | Admitting: Emergency Medicine

## 2016-06-27 ENCOUNTER — Encounter (HOSPITAL_COMMUNITY): Payer: Self-pay

## 2016-06-27 DIAGNOSIS — Z792 Long term (current) use of antibiotics: Secondary | ICD-10-CM | POA: Diagnosis not present

## 2016-06-27 DIAGNOSIS — S4992XA Unspecified injury of left shoulder and upper arm, initial encounter: Secondary | ICD-10-CM | POA: Diagnosis present

## 2016-06-27 DIAGNOSIS — F17210 Nicotine dependence, cigarettes, uncomplicated: Secondary | ICD-10-CM | POA: Diagnosis not present

## 2016-06-27 DIAGNOSIS — T148XXA Other injury of unspecified body region, initial encounter: Secondary | ICD-10-CM

## 2016-06-27 DIAGNOSIS — Z79899 Other long term (current) drug therapy: Secondary | ICD-10-CM | POA: Diagnosis not present

## 2016-06-27 DIAGNOSIS — M549 Dorsalgia, unspecified: Secondary | ICD-10-CM | POA: Insufficient documentation

## 2016-06-27 DIAGNOSIS — X58XXXA Exposure to other specified factors, initial encounter: Secondary | ICD-10-CM | POA: Insufficient documentation

## 2016-06-27 DIAGNOSIS — Y9389 Activity, other specified: Secondary | ICD-10-CM | POA: Diagnosis not present

## 2016-06-27 DIAGNOSIS — Y999 Unspecified external cause status: Secondary | ICD-10-CM | POA: Diagnosis not present

## 2016-06-27 DIAGNOSIS — Y929 Unspecified place or not applicable: Secondary | ICD-10-CM | POA: Insufficient documentation

## 2016-06-27 DIAGNOSIS — S46912A Strain of unspecified muscle, fascia and tendon at shoulder and upper arm level, left arm, initial encounter: Secondary | ICD-10-CM | POA: Diagnosis not present

## 2016-06-27 MED ORDER — NAPROXEN 500 MG PO TABS: 500.0000 mg | ORAL_TABLET | Freq: Two times a day (BID) | ORAL | 0 refills | Status: DC

## 2016-06-27 MED ORDER — METHOCARBAMOL 500 MG PO TABS: 500.0000 mg | ORAL_TABLET | Freq: Two times a day (BID) | ORAL | 0 refills | Status: DC | PRN

## 2016-06-27 NOTE — ED Provider Notes (Signed)
[facility] DEPT Provider Note   CSN: [id number] Arrival date & time: 06/27/16  1100  By signing my name below, I, [name], attest that this documentation has been prepared under the direction and in the presence of [facility], PA-C. Electronically Signed: Judithann Sauger, ED Scribe. 06/27/16. 12:53 PM.   History   Chief Complaint Chief Complaint  Patient presents with  . Shoulder Pain    HPI Comments: Stephanie [name] is a 35 y.o. female who presents to the Emergency Department complaining of sudden onset of persistent moderate left posterior shoulder pain s/p attempting to put her bumper bracket back in place this morning. She describes her pain as if it is "pinching". Pain worse with movement. Pt states that she tried Tylenol PTA with mild relief. 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 Pt has NKDA. She denies any numbness, tingling, weakness.   The history is provided by the patient. No language interpreter was used.    Past Medical History:  Diagnosis Date  . BV (bacterial vaginosis)   . UTI (lower urinary tract infection)     There are no active problems to display for this patient.   Past Surgical History:  Procedure Laterality Date  . CESAREAN SECTION      OB History    Gravida Para Term Preterm AB Living   3 1 1   1 1    SAB TAB Ectopic Multiple Live Births     1             Home Medications    Prior to Admission medications   Medication Sig Start Date End Date Taking? Authorizing Provider  cephALEXin (KEFLEX) 500 MG capsule Take 1 capsule (500 mg total) by mouth 4 (four) times daily. 06/21/16   Nehemiah Settle, NP  methocarbamol (ROBAXIN) 500 MG tablet Take 1 tablet (500 mg total) by mouth 2 (two) times daily as needed for muscle spasms. 06/27/16   Ozella Almond Aundra Pung, PA-C  naproxen (NAPROSYN) 500 MG tablet Take 1 tablet (500 mg total) by mouth 2 (two) times daily. 06/27/16   Ozella Almond Rakhi Romagnoli,  PA-C  phenazopyridine (PYRIDIUM) 200 MG tablet Take 1 tablet (200 mg total) by mouth 3 (three) times daily. 06/21/16   Nehemiah Settle, NP  Probiotic Product (ALIGN) 4 MG CAPS Take 1 capsule (4 mg total) by mouth daily. 06/21/16   Nehemiah Settle, NP    Family History Family History  Problem Relation Age of Onset  . Heart disease Father     Social History Social History  Substance Use Topics  . Smoking status: Current Some Day Smoker    Packs/day: 0.50    Years: 10.00    Types: Cigarettes  . Smokeless tobacco: Never Used  . Alcohol use No     Comment: seldom     Allergies   Review of patient's allergies indicates no known allergies.   Review of Systems Review of Systems  Constitutional: Negative for chills and fever.  Musculoskeletal: Positive for arthralgias.  Skin: Negative for rash and wound.     Physical Exam Updated Vital Signs BP 124/85 (BP Location: Left Arm)   Pulse 64   Temp 98.5 F (36.9 C) (Oral)   Resp 18   LMP 06/22/2016   SpO2 99%   Physical Exam  Constitutional: She is oriented to person, place, and time. She appears well-developed and well-nourished. No distress.  HENT:  Head: Normocephalic and atraumatic.  Cardiovascular: Normal rate, regular rhythm and normal heart sounds.   Pulmonary/Chest: Effort normal and breath sounds normal. No respiratory distress.  Abdominal: Soft. She exhibits no distension. There is no tenderness.  Musculoskeletal:       Arms: Left shoulder : TTP as depicted in image. No midline tenderness. Full ROM. Negative empty can test, Negative Neer's. No swelling, erythema, or ecchymosis present. No step-off, crepitus, or deformity appreciated. 5/5 muscle strength of LUE. 2+ radial pulse, sensation intact, all compartments soft.   Neurological: She is alert and oriented to person, place, and time.  Skin: Skin is warm and dry.  Nursing note and vitals reviewed.    ED Treatments / Results  DIAGNOSTIC STUDIES: Oxygen  Saturation is 99% on RA, normal by my interpretation.    COORDINATION OF CARE: 12:50 PM- Pt advised of plan for treatment and pt agrees. Pt informed of her x-ray results. She will receive Naproxen and Robaxin. Advised to use ice and to rest her muscles.    Labs (all labs ordered are listed, but only abnormal results are displayed) Labs Reviewed - No data to display  EKG  EKG Interpretation None       Radiology Dg Shoulder Left  Result Date: 06/27/2016 CLINICAL DATA:  Pain posteriorly of the left shoulder at the shoulder blade. Patient with attempting to replace the bumper of her car earlier today resulting in pain. EXAM: LEFT SHOULDER - 2+ VIEW COMPARISON:  None. FINDINGS: There is no evidence of fracture or dislocation. There is no evidence of arthropathy or other focal bone abnormality. Soft tissues are unremarkable. Metallic c clothing artifacts are noted overlying the upper thorax. IMPRESSION: No acute osseous abnormality. Electronically Signed   By: Ashley Royalty M.D.   On: 06/27/2016 12:25    Procedures Procedures (including critical care time)  Medications Ordered in ED Medications - No data to display   Initial Impression / Assessment and Plan / ED Course  Stephanie Oyster, PA-C has reviewed the triage vital signs and the nursing notes.  Pertinent labs & imaging results that were available during my care of the patient were reviewed by me and considered in my medical decision making (see chart for details).  Clinical Course   Stephanie [name] presents to ED for left shoulder/back pain after injury this morning. Exam, left upper extremity is neurovascularly intact. X-rays reviewed and unremarkable. Symptomatic home care instructions discussed. Rx for Robaxin and naproxen given. PCP follow-up if symptoms do not improve. Reasons returned ED discussed and all questions answered.  Final Clinical Impressions(s) / ED Diagnoses   Final diagnoses:  Muscle strain    New  Prescriptions New Prescriptions   METHOCARBAMOL (ROBAXIN) 500 MG TABLET    Take 1 tablet (500 mg total) by mouth 2 (two) times daily as needed for muscle spasms.   NAPROXEN (NAPROSYN) 500 MG TABLET    Take 1 tablet (500 mg total) by mouth 2 (two) times daily.   I personally performed the services described in this documentation, which was scribed in my presence. The recorded information has been reviewed and is accurate.    [facility] Stephanie Feliz, PA-C 06/27/16 1258    Fredia Sorrow, MD 06/29/16 1655

## 2016-06-27 NOTE — Discharge Instructions (Signed)
Proximity as needed for pain. Ice for additional pain relief. Robaxin is your muscle relaxer to take as needed-This can make you very drowsy - please do not drink alcohol, operate heavy machinery or drive on this medication. Follow up with your primary care provider if symptoms do not improve. Return to ER for new or worsening symptoms, any additional concerns.

## 2016-06-27 NOTE — ED Notes (Signed)
Patient states she was trying to get a bumper back on a car earlier today and in doing so, hurt her left shoulder.  She has limited ROM and cannot abduct her left arm past 45 degrees without pain.  Patient is able to move LUE.

## 2016-06-27 NOTE — ED Triage Notes (Signed)
Pt c/o L shoulder pain after attempting to put a bumper bracket on her car.  Pain score 9/10.  Pt reports full ROM, but increased pain w/ movement.  Pt reports that she was pushing on bumper w/ anterior shoulder.  No deformity noted.

## 2016-12-26 ENCOUNTER — Emergency Department (HOSPITAL_COMMUNITY): Payer: Medicaid Other

## 2016-12-26 ENCOUNTER — Emergency Department (HOSPITAL_COMMUNITY)
Admission: EM | Admit: 2016-12-26 | Discharge: 2016-12-26 | Disposition: A | Discharge status: Home / Self Care | Payer: Medicaid Other | Attending: Emergency Medicine | Admitting: Emergency Medicine

## 2016-12-26 ENCOUNTER — Encounter (HOSPITAL_COMMUNITY): Payer: Self-pay

## 2016-12-26 DIAGNOSIS — K5900 Constipation, unspecified: Secondary | ICD-10-CM | POA: Diagnosis present

## 2016-12-26 DIAGNOSIS — F17210 Nicotine dependence, cigarettes, uncomplicated: Secondary | ICD-10-CM | POA: Insufficient documentation

## 2016-12-26 DIAGNOSIS — Z79899 Other long term (current) drug therapy: Secondary | ICD-10-CM | POA: Insufficient documentation

## 2016-12-26 LAB — URINALYSIS, ROUTINE W REFLEX MICROSCOPIC
BILIRUBIN URINE: NEGATIVE
Glucose, UA: NEGATIVE mg/dL
Ketones, ur: NEGATIVE mg/dL
LEUKOCYTES UA: NEGATIVE
NITRITE: NEGATIVE
PROTEIN: NEGATIVE mg/dL
SPECIFIC GRAVITY, URINE: 1.017 (ref 1.005–1.030)
pH: 6 (ref 5.0–8.0)

## 2016-12-26 LAB — COMPREHENSIVE METABOLIC PANEL
ALBUMIN: 3.9 g/dL (ref 3.5–5.0)
ALT: 17 U/L (ref 14–54)
ANION GAP: 9 (ref 5–15)
AST: 19 U/L (ref 15–41)
Alkaline Phosphatase: 44 U/L (ref 38–126)
BILIRUBIN TOTAL: 0.7 mg/dL (ref 0.3–1.2)
BUN: 8 mg/dL (ref 6–20)
CHLORIDE: 105 mmol/L (ref 101–111)
CO2: 27 mmol/L (ref 22–32)
Calcium: 9.1 mg/dL (ref 8.9–10.3)
Creatinine, Ser: 0.76 mg/dL (ref 0.44–1.00)
GFR calc Af Amer: >60 mL/min (ref >60)
GFR calc non Af Amer: >60 mL/min (ref >60)
GLUCOSE: 114 mg/dL — AB (ref 65–99)
POTASSIUM: 3.9 mmol/L (ref 3.5–5.1)
SODIUM: 141 mmol/L (ref 135–145)
TOTAL PROTEIN: 6.3 g/dL — AB (ref 6.5–8.1)

## 2016-12-26 LAB — CBC
HEMATOCRIT: 40.4 % (ref 36.0–46.0)
Hemoglobin: 13.3 g/dL (ref 12.0–15.0)
MCH: 29.5 pg (ref 26.0–34.0)
MCHC: 32.9 g/dL (ref 30.0–36.0)
MCV: 89.6 fL (ref 78.0–100.0)
Platelets: 162 10*3/uL (ref 150–400)
RBC: 4.51 MIL/uL (ref 3.87–5.11)
RDW: 13.3 % (ref 11.5–15.5)
WBC: 6.9 10*3/uL (ref 4.0–10.5)

## 2016-12-26 LAB — LIPASE, BLOOD: LIPASE: 19 U/L (ref 11–51)

## 2016-12-26 LAB — POC URINE PREG, ED: PREG TEST UR: NEGATIVE

## 2016-12-26 IMAGING — CR DG ABDOMEN 1V
2 series · 2 of 2 positions shown · non-contrast
Comparison: None.

CLINICAL DATA: Acute onset of generalized abdominal distention and
constipation. Initial encounter.

EXAM:
ABDOMEN - 1 VIEW

[abdomen kub (1 of 2)]
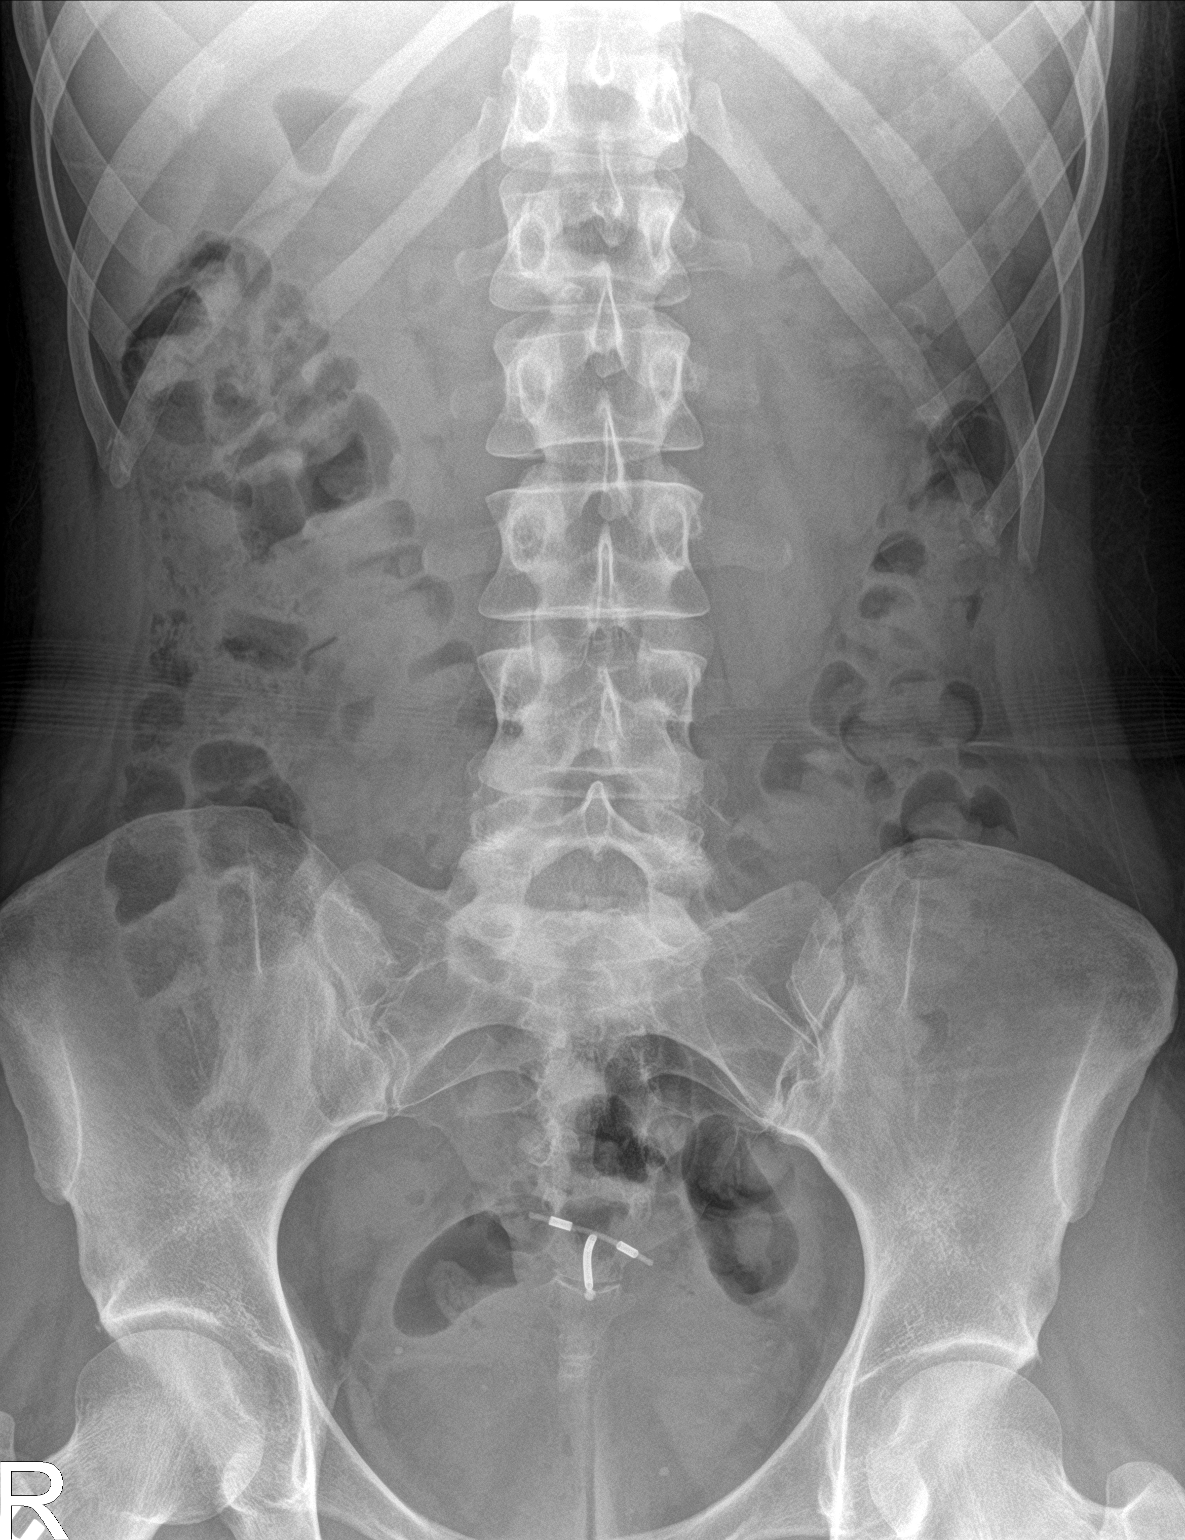

[abdomen kub (2 of 2)]
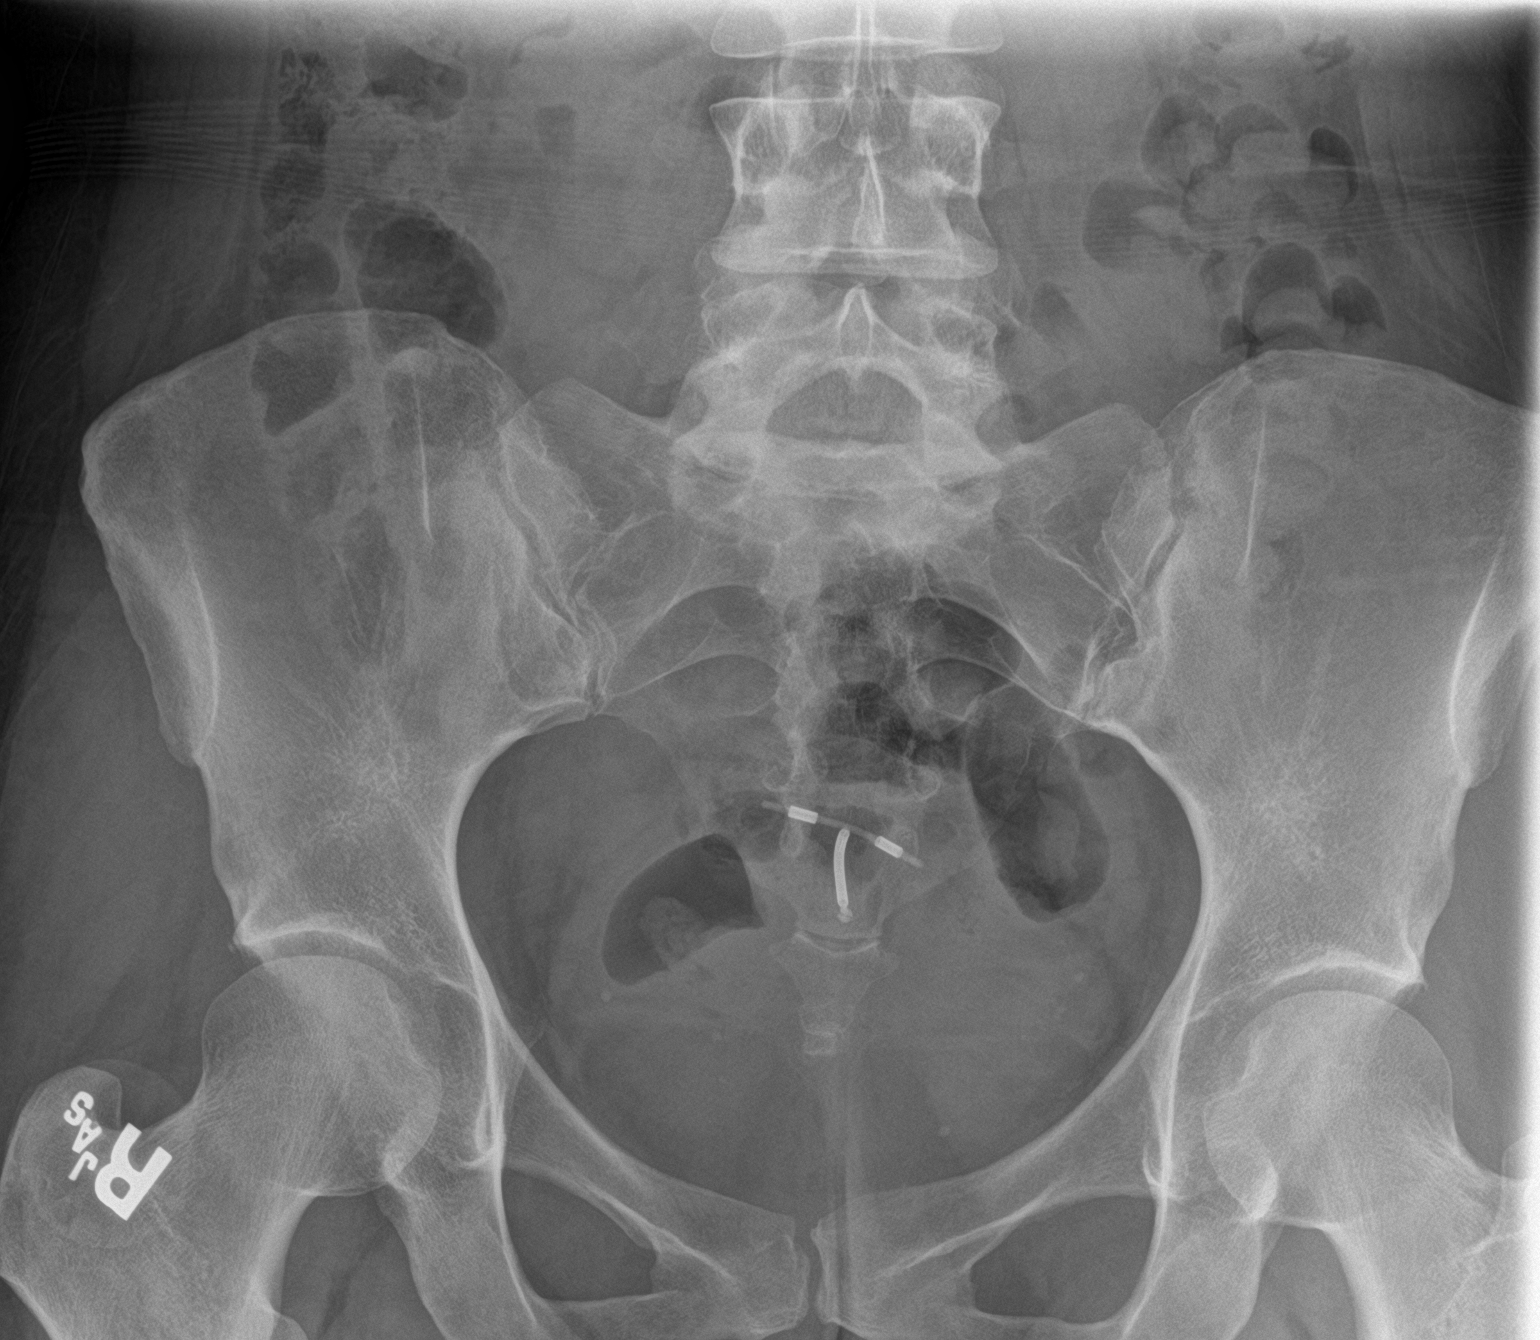

[2 of 2 positions shown; findings below may reference images not displayed]

FINDINGS: The visualized bowel gas pattern is unremarkable. Scattered air and
stool filled loops of colon are seen; no abnormal dilatation of
small bowel loops is seen to suggest small bowel obstruction. No
free intra-abdominal air is identified, though evaluation for free
air is limited on a single supine view.

An intrauterine device is noted overlying the mid pelvis.

The visualized osseous structures are within normal limits; the
sacroiliac joints are unremarkable in appearance.
IMPRESSION: Unremarkable bowel gas pattern; no free intra-abdominal air seen.
Small to moderate amount of stool noted in the colon, without
definite radiographic evidence for constipation.

## 2016-12-26 NOTE — ED Provider Notes (Signed)
[facility] DEPT Provider Note   CSN: [id number] Arrival date & time: 12/26/16  1921     History   Chief Complaint Chief Complaint  Patient presents with  . Abdominal Pain  . Constipation    HPI Stephanie [name] is a 36 y.o. female.  HPI  This is a 12 rolled female who comes in today stating that she has constipation. She states she has not had a bowel movement for 2 days. She states that she had a puppy who had worms. She reports that the puppy was "shooting and vomiting in the bed". She states that she went to an urgent care and she was treated there for worms. She states that since that time she has not had a bowel movement and normally has a bowel movement every morning. In some upper abdominal pain yesterday and states that her belly was hard. She states that the pain has decreased today she has been eating and drinking without difficulty. She denies nausea or vomiting. She is currently menstruating and states that some of her pain may be from her menses. Taking over-the-counter laxatives. She reports that she has done this before.  Past Medical History:  Diagnosis Date  . BV (bacterial vaginosis)   . UTI (lower urinary tract infection)     There are no active problems to display for this patient.   Past Surgical History:  Procedure Laterality Date  . CESAREAN SECTION      OB History    Gravida Para Term Preterm AB Living   3 1 1   1 1    SAB TAB Ectopic Multiple Live Births     1             Home Medications    Prior to Admission medications   Medication Sig Start Date End Date Taking? Authorizing Provider  acetaminophen (TYLENOL) 500 MG tablet Take 1,000 mg by mouth every 6 (six) hours as needed.   Yes Historical Provider, MD  albendazole (ALBENZA) 200 MG tablet Take 400 mg by mouth once.   Yes Historical Provider, MD  amphetamine-dextroamphetamine (ADDERALL) 10 MG tablet Take 10 mg by mouth daily as needed (for focus).   Yes Historical Provider, MD    citalopram (CELEXA) 20 MG tablet Take 20 mg by mouth daily.   Yes Historical Provider, MD  polyethylene glycol (MIRALAX / GLYCOLAX) packet Take 17 g by mouth daily as needed for moderate constipation.   Yes Historical Provider, MD    Family History Family History  Problem Relation Age of Onset  . Heart disease Father     Social History Social History  Substance Use Topics  . Smoking status: Current Some Day Smoker    Packs/day: 0.50    Years: 10.00    Types: Cigarettes  . Smokeless tobacco: Never Used  . Alcohol use No     Comment: seldom     Allergies   Patient has no known allergies.   Review of Systems Review of Systems  All other systems reviewed and are negative.    Physical Exam Updated Vital Signs BP 135/74 (BP Location: Left Arm)   Pulse 85   Temp 98.8 F (37.1 C) (Oral)   Resp 16   Ht 5' 9.5" (1.765 m)   Wt 74.8 kg   SpO2 98%   BMI 24.02 kg/m   Physical Exam  Constitutional: She is oriented to person, place, and time. She appears well-developed and well-nourished. She appears distressed.  HENT:  Head: Normocephalic and  atraumatic.  Right Ear: External ear normal.  Left Ear: External ear normal.  Nose: Nose normal.  Eyes: Conjunctivae and EOM are normal. Pupils are equal, round, and reactive to light.  Neck: Normal range of motion. Neck supple.  Pulmonary/Chest: Effort normal.  Musculoskeletal: Normal range of motion.  Neurological: She is alert and oriented to person, place, and time. She exhibits normal muscle tone. Coordination normal.  Skin: Skin is warm and dry.  Psychiatric: Her speech is normal. Thought content normal. Her mood appears anxious. Her affect is angry. She is agitated. Cognition and memory are normal.  Nursing note and vitals reviewed.    ED Treatments / Results  Labs (all labs ordered are listed, but only abnormal results are displayed) Labs Reviewed  COMPREHENSIVE METABOLIC PANEL - Abnormal; Notable for the following:        Result Value   Glucose, Bld 114 (*)    Total Protein 6.3 (*)    All other components within normal limits  URINALYSIS, ROUTINE W REFLEX MICROSCOPIC - Abnormal; Notable for the following:    APPearance HAZY (*)    Hgb urine dipstick LARGE (*)    Bacteria, UA RARE (*)    Squamous Epithelial / LPF 0-5 (*)    All other components within normal limits  LIPASE, BLOOD  CBC  POC URINE PREG, ED     Radiology No results found.  Procedures Procedures (including critical care time)  Medications Ordered in ED Medications - No data to display   Initial Impression / Assessment and Plan / ED Course  I have reviewed the triage vital signs and the nursing notes.  Pertinent labs & imaging results that were available during my care of the patient were reviewed by me and considered in my medical decision making (see chart for details).     Patient's abdomen is soft no masses palpated. I doubt that she would have significant symptoms.  I doubt the patient would have significant symptoms from not having a bowel movement for 2 days. However, patient is insistent. Urine is significant for 2 numerous to count red blood cells but I feel that this is contaminated and she is menstruating and had a clean-catch urine obtained. Plan KUB to assess for constipation obstruction. Patient feels improved. KUB without any evidence of severe obstipation or obstruction. We have discussed return precautions and need for follow-up and she voices understanding. Final Clinical Impressions(s) / ED Diagnoses   Final diagnoses:  Constipation, unspecified constipation type    New Prescriptions New Prescriptions   No medications on file     Pattricia Boss, MD 12/27/16 2343

## 2016-12-26 NOTE — ED Notes (Signed)
PT states understanding of care given, follow up care,. PT ambulated from ED to car with a steady gait.

## 2016-12-26 NOTE — ED Triage Notes (Signed)
Pt states recent new puppy in home. Dog had worms, pt states she was seen by UC, given anti-worm medications. Since has been constipated, complaining of mid abdominal pain x 2 days. Pt also states new, worsening vaginal bleeding. Pt denies any nausea or vomiting.

## 2017-01-07 ENCOUNTER — Encounter (HOSPITAL_COMMUNITY): Payer: Self-pay | Admitting: *Deleted

## 2017-01-07 ENCOUNTER — Emergency Department (HOSPITAL_COMMUNITY)
Admission: EM | Admit: 2017-01-07 | Discharge: 2017-01-08 | Disposition: A | Discharge status: Home / Self Care | Payer: Medicaid Other | Attending: Emergency Medicine | Admitting: Emergency Medicine

## 2017-01-07 DIAGNOSIS — Z79899 Other long term (current) drug therapy: Secondary | ICD-10-CM | POA: Insufficient documentation

## 2017-01-07 DIAGNOSIS — K921 Melena: Secondary | ICD-10-CM | POA: Insufficient documentation

## 2017-01-07 DIAGNOSIS — R1030 Lower abdominal pain, unspecified: Secondary | ICD-10-CM | POA: Diagnosis not present

## 2017-01-07 DIAGNOSIS — K625 Hemorrhage of anus and rectum: Secondary | ICD-10-CM | POA: Diagnosis present

## 2017-01-07 DIAGNOSIS — F17210 Nicotine dependence, cigarettes, uncomplicated: Secondary | ICD-10-CM | POA: Insufficient documentation

## 2017-01-07 LAB — URINALYSIS, ROUTINE W REFLEX MICROSCOPIC
Bilirubin Urine: NEGATIVE
Glucose, UA: NEGATIVE mg/dL
Hgb urine dipstick: NEGATIVE
KETONES UR: NEGATIVE mg/dL
LEUKOCYTES UA: NEGATIVE
NITRITE: NEGATIVE
Protein, ur: NEGATIVE mg/dL
Specific Gravity, Urine: 1.015 (ref 1.005–1.030)
pH: 6 (ref 5.0–8.0)

## 2017-01-07 LAB — COMPREHENSIVE METABOLIC PANEL
ALT: 17 U/L (ref 14–54)
ANION GAP: 7 (ref 5–15)
AST: 19 U/L (ref 15–41)
Albumin: 4.6 g/dL (ref 3.5–5.0)
Alkaline Phosphatase: 46 U/L (ref 38–126)
BILIRUBIN TOTAL: 0.7 mg/dL (ref 0.3–1.2)
BUN: 11 mg/dL (ref 6–20)
CALCIUM: 9.1 mg/dL (ref 8.9–10.3)
CO2: 28 mmol/L (ref 22–32)
Chloride: 104 mmol/L (ref 101–111)
Creatinine, Ser: 0.67 mg/dL (ref 0.44–1.00)
Glucose, Bld: 80 mg/dL (ref 65–99)
Potassium: 3.4 mmol/L — ABNORMAL LOW (ref 3.5–5.1)
Sodium: 139 mmol/L (ref 135–145)
TOTAL PROTEIN: 7.3 g/dL (ref 6.5–8.1)

## 2017-01-07 LAB — LIPASE, BLOOD: Lipase: 31 U/L (ref 11–51)

## 2017-01-07 LAB — CBC
HEMATOCRIT: 41.1 % (ref 36.0–46.0)
HEMOGLOBIN: 14.1 g/dL (ref 12.0–15.0)
MCH: 30.9 pg (ref 26.0–34.0)
MCHC: 34.3 g/dL (ref 30.0–36.0)
MCV: 90.1 fL (ref 78.0–100.0)
Platelets: 198 10*3/uL (ref 150–400)
RBC: 4.56 MIL/uL (ref 3.87–5.11)
RDW: 13.3 % (ref 11.5–15.5)
WBC: 8.6 10*3/uL (ref 4.0–10.5)

## 2017-01-07 LAB — POC OCCULT BLOOD, ED: FECAL OCCULT BLD: POSITIVE — AB

## 2017-01-07 NOTE — ED Triage Notes (Signed)
Pt complains of blood in stool and abdominal pain. Pt has had abdominal pain for the past couple of weeks but states it has become more severe.  Pt was exposed to dog with worms 2 weeks ago, pt went to [facility] but states she has not gotten test results. Pt took medication for worms 2 weeks ago.

## 2017-01-07 NOTE — ED Provider Notes (Signed)
[facility] DEPT Provider Note   CSN: [id number] Arrival date & time: 01/07/17  1817  By signing my name below, I, Dora Sims, attest that this documentation has been prepared under the direction and in the presence of Josh Aulden Calise, PA-C. Electronically Signed: Dora Sims, Scribe. 01/07/2017. 11:08 PM.  History   Chief Complaint Chief Complaint  Patient presents with  . Abdominal Pain  . Rectal Bleeding    The history is provided by the patient. No language interpreter was used.     HPI Comments: Stephanie [name] is a 36 y.o. female who presents to the Emergency Department complaining of constant suprapubic pain beginning a few days ago. She reports associated subjective fevers, constipation, nausea, dysuria, and bright red blood in her stool. She states her abdominal pain is somewhat improved post-prandially. Patient reports she was exposed to a dog with "worms" two weeks ago and has also been under a lot of stress recently; she believes these factors could be contributing to her symptoms. Patient has a PSHx of C-section but no other abdominal surgeries. She denies vomiting, diarrhea, vaginal bleeding/discharge, bruising, chest pain, SOB, or any other associated symptoms. Pt is followed by [facility].  Past Medical History:  Diagnosis Date  . BV (bacterial vaginosis)   . UTI (lower urinary tract infection)     There are no active problems to display for this patient.   Past Surgical History:  Procedure Laterality Date  . CESAREAN SECTION      OB History    Gravida Para Term Preterm AB Living   3 1 1   1 1    SAB TAB Ectopic Multiple Live Births     1             Home Medications    Prior to Admission medications   Medication Sig Start Date End Date Taking? Authorizing Provider  acetaminophen (TYLENOL) 500 MG tablet Take 1,000 mg by mouth every 6 (six) hours as needed for mild pain.    Yes Historical Provider, MD    amphetamine-dextroamphetamine (ADDERALL) 10 MG tablet Take 10 mg by mouth 2 (two) times daily as needed (for focus).    Yes Historical Provider, MD    Family History Family History  Problem Relation Age of Onset  . Heart disease Father     Social History Social History  Substance Use Topics  . Smoking status: Current Some Day Smoker    Packs/day: 0.50    Years: 10.00    Types: Cigarettes  . Smokeless tobacco: Never Used  . Alcohol use No     Comment: seldom     Allergies   Patient has no known allergies.   Review of Systems Review of Systems  Constitutional: Positive for fever (subjective).  HENT: Negative for rhinorrhea and sore throat.   Eyes: Negative for redness.  Respiratory: Negative for cough and shortness of breath.   Cardiovascular: Negative for chest pain.  Gastrointestinal: Positive for abdominal pain, blood in stool, constipation and nausea. Negative for diarrhea and vomiting.  Genitourinary: Positive for dysuria. Negative for vaginal bleeding and vaginal discharge.  Musculoskeletal: Negative for myalgias.  Skin: Negative for color change and rash.  Neurological: Negative for headaches.   Physical Exam Updated Vital Signs BP 123/81 (BP Location: Left Arm)   Pulse 79   Temp 98.6 F (37 C) (Oral)   Resp 18   Wt 157 lb (71.2 kg)   SpO2 98%   BMI 22.85 kg/m   Physical  Exam  Constitutional: She is oriented to person, place, and time. She appears well-developed and well-nourished. No distress.  HENT:  Head: Normocephalic and atraumatic.  Eyes: Conjunctivae and EOM are normal. Right eye exhibits no discharge. Left eye exhibits no discharge.  Neck: Normal range of motion. Neck supple. No tracheal deviation present.  Cardiovascular: Normal rate, regular rhythm and normal heart sounds.   Pulmonary/Chest: Effort normal and breath sounds normal. No respiratory distress.  Abdominal: Soft. There is tenderness (mild) in the suprapubic area. There is no  rebound and no guarding.  Genitourinary:  Genitourinary Comments: Rectal exam: Brown stool. No external hemorrhoids. No palpable internal hemorrhoids. No masses.  Musculoskeletal: Normal range of motion.  Neurological: She is alert and oriented to person, place, and time.  Skin: Skin is warm and dry.  Psychiatric: She has a normal mood and affect. Her behavior is normal.  Nursing note and vitals reviewed.  ED Treatments / Results  Labs (all labs ordered are listed, but only abnormal results are displayed) Labs Reviewed  COMPREHENSIVE METABOLIC PANEL - Abnormal; Notable for the following:       Result Value   Potassium 3.4 (*)    All other components within normal limits  URINALYSIS, ROUTINE W REFLEX MICROSCOPIC - Abnormal; Notable for the following:    APPearance HAZY (*)    All other components within normal limits  POC OCCULT BLOOD, ED - Abnormal; Notable for the following:    Fecal Occult Bld POSITIVE (*)    All other components within normal limits  LIPASE, BLOOD  CBC    Radiology Ct Abdomen Pelvis W Contrast  Result Date: 01/08/2017 CLINICAL DATA:  Suprapubic pain for few days with fevers, constipation, nausea, dysuria and bright red blood in stool. No leukocytosis. EXAM: CT ABDOMEN AND PELVIS WITH CONTRAST TECHNIQUE: Multidetector CT imaging of the abdomen and pelvis was performed using the standard protocol following bolus administration of intravenous contrast. CONTRAST:  100 cc Isovue-300 COMPARISON:  None. FINDINGS: Lower chest: Normal size cardiac chambers. No pericardial effusion. Dependent bibasilar atelectasis. Hepatobiliary: No focal liver abnormality is seen. No gallstones, gallbladder wall thickening, or biliary dilatation.Normal Pancreas: Unremarkable. No pancreatic ductal dilatation or surrounding inflammatory changes. Spleen: Normal in size without focal abnormality. Adrenals/Urinary Tract: Symmetric nephrograms. No obstructive uropathy. No enhancing renal mass.  Normal bilateral adrenal glands. Unremarkable bladder. Stomach/Bowel: Contracted stomach. Normal small bowel rotation. No bowel obstruction or inflammation. Normal-appearing appendix. Moderate amount of stool in the ascending colon. Mild anorectal thickening may be due to internal hemorrhoids or potentially proctitis. No significant diverticular disease. Vascular/Lymphatic: No abdominal aortic aneurysm or dissection. Prominent periuterine vessels suspicious for findings of pelvic vascular congestion. No enlarged abdominal or pelvic lymph nodes. Reproductive: IUD in the uterus. Physiologic sized follicle in the left measuring 2.4 cm. Other: Trace physiologic free fluid in the cul-de-sac. Musculoskeletal: L4-5 broad-based disc bulge. Degenerative disc disease L5-S1. No acute nor suspicious osseous abnormality. IMPRESSION: 1. Mild anorectal thickening may be secondary to internal hemorrhoids or potentially proctitis. 2. Prominent periuterine vasculature which may reflect changes of pelvic vascular congestion syndrome, a diagnosis of exclusion for the workup pelvic pain after other etiologies have been excluded. 3. Broad-based disc bulging at L4-5 with degenerative disc space narrowing L5-S1. Electronically Signed   By: Ashley Royalty M.D.   On: 01/08/2017 01:23    Procedures Procedures (including critical care time)  DIAGNOSTIC STUDIES: Oxygen Saturation is 98% on RA, normal by my interpretation.    COORDINATION OF CARE: 11:19 PM Discussed  treatment plan with pt at bedside and pt agreed to plan.  Medications Ordered in ED Medications - No data to display   Initial Impression / Assessment and Plan / ED Course  I have reviewed the triage vital signs and the nursing notes.  Pertinent labs & imaging results that were available during my care of the patient were reviewed by me and considered in my medical decision making (see chart for details).     Vital signs reviewed and are as follows: Vitals:    01/07/17 2247 01/08/17 0118  BP: 123/81 117/75  Pulse: 79 75  Resp: 18 18  Temp:      Patient informed of lab results. Rectal exam performed with RN chaperone. No significant tenderness on exam. No thrombosed hemorrhoids noted. Hemoccult was positive. No obvious blood on exam.   Patient and I had a discussion about the risks and benefits of CT imaging at this point. Patient was offered the option of close monitoring at home with close outpatient follow-up versus CT here given blood noted in stool with pain. She elects to proceed with imaging.  2:51 AM patient updated on imaging results. Regarding the pelvic vascular congestion, it is unclear if this is related to her symptoms, and she is encouraged follow-up with GYN especially if symptoms continue. She may have internal hemorrhoids causing her bright red bleeding. I have low suspicion for proctitis given that she does not have any discharge or significant pain with digital rectal exam.   The patient was urged to return to the Emergency Department immediately with worsening of current symptoms, worsening abdominal pain, persistent vomiting, blood noted in stools, fever, or any other concerns. The patient verbalized understanding.   Patient counseled on use of narcotic pain medications. Counseled not to combine these medications with others containing tylenol. Urged not to drink alcohol, drive, or perform any other activities that requires focus while taking these medications. The patient verbalizes understanding and agrees with the plan.   Final Clinical Impressions(s) / ED Diagnoses   Final diagnoses:  Lower abdominal pain  Hematochezia   Patient with lower abdominal pain. Vitals are stable, no fever. Labs reassuring. Imaging as above, no emergent problems noted. No vaginal complaints. No signs of dehydration, patient is tolerating PO's. Lungs are clear and no signs suggestive of PNA. Low concern for obstruction, appendicitis, cholecystitis,  pancreatitis, ruptured viscus, UTI, kidney stone, aortic dissection, aortic aneurysm or other emergent abdominal etiology. Supportive therapy indicated with return if symptoms worsen.    New Prescriptions New Prescriptions   DICYCLOMINE (BENTYL) 20 MG TABLET    Take 1 tablet (20 mg total) by mouth 2 (two) times daily.   HYDROCODONE-ACETAMINOPHEN (NORCO/VICODIN) 5-325 MG TABLET    Take 1-2 tablets every 6 hours as needed for severe pain   I personally performed the services described in this documentation, which was scribed in my presence. The recorded information has been reviewed and is accurate.    Carlisle Cater, PA-C 01/08/17 [place], MD 01/08/17 [id number]

## 2017-01-07 NOTE — ED Notes (Addendum)
Pt. Hasn't been able to poop normally, they have been much smaller.  Pt. States she has been sweating profusely. Pt. States no history of hemorrhoids, has blood only when pooping, and can see it when she wipes.  Pt. States there is not enough blood to require a pad. She also states that she felt like she could taste blood this morning in the back of her throat (as if she had a nose bleed)...she felt fatigue, clammy, and lightheaded.

## 2017-01-08 ENCOUNTER — Encounter (HOSPITAL_COMMUNITY): Payer: Self-pay

## 2017-01-08 ENCOUNTER — Emergency Department (HOSPITAL_COMMUNITY): Payer: Medicaid Other

## 2017-01-08 MED ORDER — DICYCLOMINE HCL 20 MG PO TABS: 20.0000 mg | ORAL_TABLET | Freq: Two times a day (BID) | ORAL | 0 refills | Status: DC

## 2017-01-08 MED ORDER — HYDROCODONE-ACETAMINOPHEN 5-325 MG PO TABS: ORAL_TABLET | ORAL | 0 refills | Status: DC

## 2017-01-08 MED ORDER — IOPAMIDOL (ISOVUE-300) INJECTION 61%
INTRAVENOUS | Status: AC
  Administered 2017-01-08: 100 mL via INTRAVENOUS
  Filled 2017-01-08: qty 100

## 2017-01-08 MED ORDER — KETOROLAC TROMETHAMINE 30 MG/ML IJ SOLN
30.0000 mg | Freq: Once | INTRAMUSCULAR | Status: AC
  Administered 2017-01-08: 30 mg via INTRAVENOUS
  Filled 2017-01-08: qty 1

## 2017-01-08 MED ORDER — IOPAMIDOL (ISOVUE-300) INJECTION 61%
100.0000 mL | Freq: Once | INTRAVENOUS | Status: AC | PRN
  Administered 2017-01-08: 100 mL via INTRAVENOUS

## 2017-01-08 NOTE — Discharge Instructions (Signed)
Please read and follow all provided instructions.  Your diagnoses today include:  1. Lower abdominal pain   2. Hematochezia     Tests performed today include:  Blood counts and electrolytes  Blood tests to check liver and kidney function  Blood tests to check pancreas function  Urine test to look for infection and pregnancy (in women)  CT scan - shows possible pelvic vascular congestion (should be followed up by your GYN if symptoms continue) and possible internal hemorrhoids.   Vital signs. See below for your results today.   Medications prescribed:   Vicodin (hydrocodone/acetaminophen) - narcotic pain medication  DO NOT drive or perform any activities that require you to be awake and alert because this medicine can make you drowsy. BE VERY CAREFUL not to take multiple medicines containing Tylenol (also called acetaminophen). Doing so can lead to an overdose which can damage your liver and cause liver failure and possibly death.   Bentyl - medication for intestinal cramps and spasms  Take any prescribed medications only as directed.  Home care instructions:   Follow any educational materials contained in this packet.  Follow-up instructions: Please follow-up with your primary care provider/GYN in the next 7 days for further evaluation of your symptoms.    Return instructions:  SEEK IMMEDIATE MEDICAL ATTENTION IF:  The pain does not go away or becomes severe   A temperature above 101F develops   Repeated vomiting occurs (multiple episodes)   The pain becomes localized to portions of the abdomen. The right side could possibly be appendicitis. In an adult, the left lower portion of the abdomen could be colitis or diverticulitis.   Blood is being passed in stools or vomit (bright red or black tarry stools)   You develop chest pain, difficulty breathing, dizziness or fainting, or become confused, poorly responsive, or inconsolable (young children)  If you have any  other emergent concerns regarding your health  Additional Information: Abdominal (belly) pain can be caused by many things. Your caregiver performed an examination and possibly ordered blood/urine tests and imaging (CT scan, x-rays, ultrasound). Many cases can be observed and treated at home after initial evaluation in the emergency department. Even though you are being discharged home, abdominal pain can be unpredictable. Therefore, you need a repeated exam if your pain does not resolve, returns, or worsens. Most patients with abdominal pain don't have to be admitted to the hospital or have surgery, but serious problems like appendicitis and gallbladder attacks can start out as nonspecific pain. Many abdominal conditions cannot be diagnosed in one visit, so follow-up evaluations are very important.  Your vital signs today were: BP 117/75 (BP Location: Left Arm)    Pulse 75    Temp 98.6 F (37 C) (Oral)    Resp 18    Wt 71.2 kg    SpO2 100%    BMI 22.85 kg/m  If your blood pressure (bp) was elevated above 135/85 this visit, please have this repeated by your doctor within one month. --------------

## 2017-01-24 ENCOUNTER — Encounter (HOSPITAL_COMMUNITY): Payer: Self-pay | Admitting: Emergency Medicine

## 2017-01-24 DIAGNOSIS — R1084 Generalized abdominal pain: Secondary | ICD-10-CM | POA: Diagnosis not present

## 2017-01-24 DIAGNOSIS — F17210 Nicotine dependence, cigarettes, uncomplicated: Secondary | ICD-10-CM | POA: Insufficient documentation

## 2017-01-24 DIAGNOSIS — R109 Unspecified abdominal pain: Secondary | ICD-10-CM | POA: Diagnosis present

## 2017-01-24 NOTE — ED Triage Notes (Signed)
Pt reports being treated for pinworms in stool and now is having increasing abd pain that feels sharp in nature.

## 2017-01-25 ENCOUNTER — Emergency Department (HOSPITAL_COMMUNITY)
Admission: EM | Admit: 2017-01-25 | Discharge: 2017-01-25 | Disposition: A | Discharge status: Home / Self Care | Payer: Medicaid Other | Attending: Emergency Medicine | Admitting: Emergency Medicine

## 2017-01-25 DIAGNOSIS — R1084 Generalized abdominal pain: Secondary | ICD-10-CM

## 2017-01-25 LAB — CBC WITH DIFFERENTIAL/PLATELET
Basophils Absolute: 0 10*3/uL (ref 0.0–0.1)
Basophils Relative: 0 %
EOS ABS: 0.1 10*3/uL (ref 0.0–0.7)
EOS PCT: 1 %
HCT: 41.1 % (ref 36.0–46.0)
Hemoglobin: 13.8 g/dL (ref 12.0–15.0)
LYMPHS ABS: 2.7 10*3/uL (ref 0.7–4.0)
Lymphocytes Relative: 30 %
MCH: 30.1 pg (ref 26.0–34.0)
MCHC: 33.6 g/dL (ref 30.0–36.0)
MCV: 89.5 fL (ref 78.0–100.0)
MONO ABS: 0.7 10*3/uL (ref 0.1–1.0)
MONOS PCT: 8 %
Neutro Abs: 5.4 10*3/uL (ref 1.7–7.7)
Neutrophils Relative %: 61 %
PLATELETS: 186 10*3/uL (ref 150–400)
RBC: 4.59 MIL/uL (ref 3.87–5.11)
RDW: 12.7 % (ref 11.5–15.5)
WBC: 8.9 10*3/uL (ref 4.0–10.5)

## 2017-01-25 LAB — COMPREHENSIVE METABOLIC PANEL
ALT: 21 U/L (ref 14–54)
ANION GAP: 8 (ref 5–15)
AST: 24 U/L (ref 15–41)
Albumin: 4.1 g/dL (ref 3.5–5.0)
Alkaline Phosphatase: 53 U/L (ref 38–126)
BUN: 12 mg/dL (ref 6–20)
CHLORIDE: 107 mmol/L (ref 101–111)
CO2: 26 mmol/L (ref 22–32)
Calcium: 9 mg/dL (ref 8.9–10.3)
Creatinine, Ser: 0.78 mg/dL (ref 0.44–1.00)
GFR calc non Af Amer: >60 mL/min (ref >60)
Glucose, Bld: 119 mg/dL — ABNORMAL HIGH (ref 65–99)
Potassium: 3.4 mmol/L — ABNORMAL LOW (ref 3.5–5.1)
SODIUM: 141 mmol/L (ref 135–145)
Total Bilirubin: 0.3 mg/dL (ref 0.3–1.2)
Total Protein: 6.9 g/dL (ref 6.5–8.1)

## 2017-01-25 MED ORDER — ACETAMINOPHEN 325 MG PO TABS
650.0000 mg | ORAL_TABLET | Freq: Once | ORAL | Status: AC
  Administered 2017-01-25: 650 mg via ORAL
  Filled 2017-01-25: qty 2

## 2017-01-25 NOTE — Discharge Instructions (Signed)
Please read and follow all provided instructions.  Your diagnoses today include:  1. Generalized abdominal pain     Tests performed today include:  Blood counts and electrolytes  Blood tests to check liver and kidney function  Vital signs. See below for your results today.   Medications prescribed:   None  Take any prescribed medications only as directed.  Home care instructions:   Follow any educational materials contained in this packet.  Follow-up instructions: Please follow-up with your primary care provider in the next 2 days for further evaluation of your symptoms.    Return instructions:  SEEK IMMEDIATE MEDICAL ATTENTION IF:  The pain does not go away or becomes severe   A temperature above 101F develops   Repeated vomiting occurs (multiple episodes)   The pain becomes localized to portions of the abdomen. The right side could possibly be appendicitis. In an adult, the left lower portion of the abdomen could be colitis or diverticulitis.   Blood is being passed in stools or vomit (bright red or black tarry stools)   You develop chest pain, difficulty breathing, dizziness or fainting, or become confused, poorly responsive, or inconsolable (young children)  If you have any other emergent concerns regarding your health  Additional Information: Abdominal (belly) pain can be caused by many things. Your caregiver performed an examination and possibly ordered blood/urine tests and imaging (CT scan, x-rays, ultrasound). Many cases can be observed and treated at home after initial evaluation in the emergency department. Even though you are being discharged home, abdominal pain can be unpredictable. Therefore, you need a repeated exam if your pain does not resolve, returns, or worsens. Most patients with abdominal pain don't have to be admitted to the hospital or have surgery, but serious problems like appendicitis and gallbladder attacks can start out as nonspecific pain.  Many abdominal conditions cannot be diagnosed in one visit, so follow-up evaluations are very important.  Your vital signs today were: BP 108/70 (BP Location: Right Arm)    Pulse 72    Temp 98.4 F (36.9 C) (Oral)    Resp 18    Ht 5\' 9"  (1.753 m)    Wt 79.4 kg    SpO2 99%    BMI 25.84 kg/m  If your blood pressure (bp) was elevated above 135/85 this visit, please have this repeated by your doctor within one month. --------------

## 2017-01-25 NOTE — ED Notes (Signed)
Patient left prior to receiving discharge paperwork. When entered room patient was gone. She is A&O x4 and denied pain @ 300am. No IV lines

## 2017-01-25 NOTE — ED Provider Notes (Signed)
[facility] DEPT Provider Note   CSN: [id number] Arrival date & time: 01/24/17  2325  By signing my name below, I, Levester Fresh, attest that this documentation has been prepared under the direction and in the presence of Carlisle Cater, [place].   Electronically Signed: Levester Fresh, Scribe. 01/25/2017. 1:35 AM.  History   Chief Complaint Chief Complaint  Patient presents with  . Abdominal Pain   Stephanie [name] is a 36 y.o. female who presents to the Emergency Department with complaints of parasites and blood in her stool. Pt states that she visited an outpatient clinic and was treated for pinworms, however is unable to definitively confirm a positive stool sample for parasites. Here, she specifically endorses abdominal pain, headaches and fatigue. Her sx worsened after taking the de-wormer.   Pt denies experiencing any other acute sx, including shortness of breath or fevers.  Pt was seen by me on 01/07/2017 and d/c home. She has non-acute findings on CT at that time.  The history is provided by the patient and medical records. No language interpreter was used.    Past Medical History:  Diagnosis Date  . BV (bacterial vaginosis)   . UTI (lower urinary tract infection)    There are no active problems to display for this patient.  Past Surgical History:  Procedure Laterality Date  . CESAREAN SECTION     OB History    Gravida Para Term Preterm AB Living   3 1 1   1 1    SAB TAB Ectopic Multiple Live Births     1           Home Medications    Prior to Admission medications   Medication Sig Start Date End Date Taking? Authorizing Provider  acetaminophen (TYLENOL) 500 MG tablet Take 1,000 mg by mouth every 6 (six) hours as needed for mild pain.    Yes Historical Provider, MD  FOCALIN XR 10 MG 24 hr capsule Take 10 mg by mouth every morning. 01/22/17  Yes Historical Provider, MD  dicyclomine (BENTYL) 20 MG tablet Take 1 tablet (20 mg total) by mouth 2 (two) times  daily. Patient not taking: Reported on 01/25/2017 01/08/17   Carlisle Cater, PA-C  HYDROcodone-acetaminophen (NORCO/VICODIN) 5-325 MG tablet Take 1-2 tablets every 6 hours as needed for severe pain Patient not taking: Reported on 01/25/2017 01/08/17   Carlisle Cater, PA-C   Family History Family History  Problem Relation Age of Onset  . Heart disease Father    Social History Social History  Substance Use Topics  . Smoking status: Current Some Day Smoker    Packs/day: 0.50    Years: 10.00    Types: Cigarettes  . Smokeless tobacco: Never Used  . Alcohol use No     Comment: seldom   Allergies   Patient has no known allergies.   Review of Systems Review of Systems  Constitutional: Positive for fatigue. Negative for fever.  HENT: Negative for rhinorrhea and sore throat.   Eyes: Negative for redness.  Respiratory: Negative for cough and shortness of breath.   Cardiovascular: Negative for chest pain.  Gastrointestinal: Positive for abdominal pain and blood in stool. Negative for diarrhea, nausea and vomiting.  Genitourinary: Negative for dysuria.  Musculoskeletal: Negative for myalgias.  Skin: Negative for rash.  Neurological: Negative for headaches.    Physical Exam Updated Vital Signs BP 118/68 (BP Location: Right Arm)   Pulse 77   Temp 98.4 F (36.9 C) (Oral)   Resp 18  Ht 5\' 9"  (1.753 m)   Wt 175 lb (79.4 kg)   SpO2 99%   BMI 25.84 kg/m   Physical Exam  Constitutional: She appears well-developed and well-nourished.  HENT:  Head: Normocephalic and atraumatic.  Mouth/Throat: Oropharynx is clear and moist.  Eyes: Conjunctivae are normal. Right eye exhibits no discharge. Left eye exhibits no discharge.  Neck: Normal range of motion. Neck supple.  Cardiovascular: Normal rate, regular rhythm and normal heart sounds.   Pulmonary/Chest: Effort normal and breath sounds normal. No respiratory distress. She has no wheezes. She has no rales.  Abdominal: Soft. There is  tenderness (Generalized, minimal). There is no rebound and no guarding.  Neurological: She is alert.  Skin: Skin is warm and dry.  Psychiatric: Her mood appears anxious.  Nursing note and vitals reviewed.    ED Treatments / Results  DIAGNOSTIC STUDIES: Oxygen Saturation is 98% on room air, normal by my interpretation.    COORDINATION OF CARE: 12:38 AM Discussed treatment plan with pt at bedside and pt agreed to plan.  Labs (all labs ordered are listed, but only abnormal results are displayed) Labs Reviewed  COMPREHENSIVE METABOLIC PANEL - Abnormal; Notable for the following:       Result Value   Potassium 3.4 (*)    Glucose, Bld 119 (*)    All other components within normal limits  CBC WITH DIFFERENTIAL/PLATELET    Procedures Procedures (including critical care time)  Medications Ordered in ED Medications  acetaminophen (TYLENOL) tablet 650 mg (650 mg Oral Given 01/25/17 0229)     Initial Impression / Assessment and Plan / ED Course  I have reviewed the triage vital signs and the nursing notes.  Pertinent labs & imaging results that were available during my care of the patient were reviewed by me and considered in my medical decision making (see chart for details).     Patient seen and examined. Labs reviewed with patient. Hgb is stable and normal. She has Bentyl at home to use if needed. Otherwise Tylenol. Return to the emergency department with worsening pain, persistent vomiting, increased bleeding in the stool. She is planning on following up with her primary care physician to ensure appropriate treatment for potential parasite infection. Discussed that she may need to see a GI physician if her abdominal symptoms do not improve with this therapy.  Vital signs reviewed and are as follows: BP 108/70 (BP Location: Right Arm)   Pulse 72   Temp 98.4 F (36.9 C) (Oral)   Resp 18   Ht 5\' 9"  (1.753 m)   Wt 79.4 kg   SpO2 99%   BMI 25.84 kg/m     Final Clinical  Impressions(s) / ED Diagnoses   Final diagnoses:  Generalized abdominal pain   Patient with generalized abdominal pain, possible parasite infection. I'm not certain as to the definite etiology of her generalized abdominal pain and bleeding. Not sure that her complete complaint will be explained by parasite infection. I encouraged GI follow-up if not improved with albendazole. She is currently under care of her PCP and has follow-up. No symptoms of anemia today.   New Prescriptions Current Discharge Medication List     I personally performed the services described in this documentation, which was scribed in my presence. The recorded information has been reviewed and is accurate.    Carlisle Cater, PA-C 01/25/17 [place], MD 01/25/17 [phone]

## 2017-02-17 ENCOUNTER — Encounter: Payer: Self-pay | Admitting: Physician Assistant

## 2017-02-25 ENCOUNTER — Encounter: Payer: Self-pay | Admitting: Physician Assistant

## 2017-02-25 ENCOUNTER — Ambulatory Visit (INDEPENDENT_AMBULATORY_CARE_PROVIDER_SITE_OTHER): Payer: Medicaid Other | Admitting: Physician Assistant

## 2017-02-25 ENCOUNTER — Encounter (INDEPENDENT_AMBULATORY_CARE_PROVIDER_SITE_OTHER): Payer: Self-pay

## 2017-02-25 VITALS — BP 98/64 | HR 72 | Ht 69.0 in | Wt 166.2 lb

## 2017-02-25 DIAGNOSIS — R109 Unspecified abdominal pain: Secondary | ICD-10-CM | POA: Diagnosis not present

## 2017-02-25 DIAGNOSIS — K625 Hemorrhage of anus and rectum: Secondary | ICD-10-CM | POA: Diagnosis not present

## 2017-02-25 DIAGNOSIS — R935 Abnormal findings on diagnostic imaging of other abdominal regions, including retroperitoneum: Secondary | ICD-10-CM

## 2017-02-25 MED ORDER — NA SULFATE-K SULFATE-MG SULF 17.5-3.13-1.6 GM/177ML PO SOLN: 1.0000 | Freq: Once | ORAL | 0 refills | Status: AC

## 2017-02-25 NOTE — Progress Notes (Signed)
Subjective:    Patient ID: Stephanie [name], female    DOB: March 15, 1981, 36 y.o.   MRN: [id number]  HPI Latrish is a 36 year old white female, new to GI today referred by [facility]/ Willey Blade NP for evaluation of abdominal discomfort, recent diarrhea and intermittent rectal bleeding, abnormal CT scan of the abdomen and pelvis and recent concern for parasitic infection. She has history of anxiety and ADHD. She admits to being very stressed lately because she is going through a custody battle with her child. She says her current symptoms starting in April. She had adopted a puppy who turned out to be infested with worms. He was de-wormed and passed apparently a very large amount of worms and was incontinent in her apartment. She feels that she and her daughter were both exposed. Within a week or so later she developed abdominal cramping and discomfort and then passed what she felt was worms in her stool. She barely was initially seen at [facility] and then was seen at [facility]. She says she did do stool cultures ( do not have those results )and was treated with albendazole. She was actually given a second course of albendazole a couple of weeks later. She tells me that stool specimen was negative at that point. She had ER visit on 01/25/2017 with abdominal discomfort and underwent CT of the abdomen and pelvis which showed a moderate amount of stool, and mild anorectal thickening concerning for proctitis. 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 Labs at that time showed a normal CBC and see met was unremarkable with the exception of potassium at 3.4 and stool for occult blood was positive.  At this point she says she has been feeling a bit better. She is not having any ongoing abdominal pain but has been having episodes of abdominal pain when she is "stressed out". Her appetite is been okay her  weight has been stable. She describes some mild rectal irritation or discomfort and is still seeing small amounts of blood off and on with her stools. Family history is negative for colon cancer or IBD.  Review of Systems Pertinent positive and negative review of systems were noted in the above HPI section.  All other review of systems was otherwise negative.  Outpatient Encounter Prescriptions as of 02/25/2017  Medication Sig  . clonazePAM (KLONOPIN) 0.5 MG tablet Take 0.5 mg by mouth as needed for anxiety.  [name] FOCALIN XR 10 MG 24 hr capsule Take 10 mg by mouth every morning.  . Na Sulfate-K Sulfate-Mg Sulf 17.5-3.13-1.6 GM/180ML SOLN Take 1 kit by mouth once.  . [DISCONTINUED] acetaminophen (TYLENOL) 500 MG tablet Take 1,000 mg by mouth every 6 (six) hours as needed for mild pain.    No facility-administered encounter medications on file as of 02/25/2017.    No Known Allergies There are no active problems to display for this patient.  Social History   Social History  . Marital status: Single    Spouse name: N/A  . Number of children: N/A  . Years of education: N/A   Occupational History  . Not on file.   Social History Main Topics  . Smoking status: Current Some Day Smoker    Packs/day: 0.50    Years: 10.00    Types: Cigarettes  . Smokeless tobacco: Never Used  . Alcohol use No     Comment: seldom  . Drug use: No  .  Sexual activity: Yes    Birth control/ protection: None, Other-see comments     Comment: nuva ring   Other Topics Concern  . Not on file   Social History Narrative  . No narrative on file    Ms. Mittelman's family history includes Heart disease in her father.      Objective:    Vitals:   02/25/17 0957  BP: 98/64  Pulse: 72    Physical Exam well-developed young white female in no acute distress, accompanied by her daughter. Blood pressure 98/64 pulse 72, height 5 foot 9, weight 166, BMI of 24.5. HEENT; nontraumatic normocephalic EOMI PERRLA sclera  anicteric, Cardiovascular; regular rate and rhythm with S1-S2 no murmur or gallop, Pulmonary; clear bilaterally, Abdomen; soft, nontender nondistended bowel sounds are active there is no palpable mass or hepatosplenomegaly, Rectal ;exam not done, Extremities; no clubbing cyanosis or edema skin warm and dry, Neuropsych; mood and affect appropriate       Assessment & Plan:   #73 36 year old white female with 6-7 week history of abdominal discomfort cramping intermittent blood in stool. Initial symptoms in setting of concerns for limited infectious and as she had an infected puppy. She was treated with 2 courses of Abendazole. I do not have results of stool cultures available. C T scan of the abdomen and pelvis from 01/25/2017 raises concerns for proctitis  Rule out proctitis, rule out underlying IBD #2 anxiety #3 ADHD  Plan; Have requested copies of her previous stool studies Patient will be scheduled for colonoscopy with Dr. Havery Moros. Procedure discussed in detail with patient including risks and benefits and she is agreeable to proceed. Patient will try taking a half of a Klonopin (for which she has an RX)on a when necessary basis for her abdominal pain that she associates with stress.  Amy S Esterwood PA-C 02/25/2017   Cc: [facility]

## 2017-02-25 NOTE — Patient Instructions (Signed)
You have been scheduled for a colonoscopy. Please follow written instructions given to you at your visit today.  Please pick up your prep supplies at the pharmacy within the next 1-3 days. If you use inhalers (even only as needed), please bring them with you on the day of your procedure. Your physician has requested that you go to www.startemmi.com and enter the access code given to you at your visit today. This web site gives a general overview about your procedure. However, you should still follow specific instructions given to you by our office regarding your preparation for the procedure.  If you are age 110 or older, your body mass index should be between 23-30. Your Body mass index is 24.54 kg/m. If this is out of the aforementioned range listed, please consider follow up with your Primary Care Provider.  If you are age 44 or younger, your body mass index should be between 19-25. Your Body mass index is 24.54 kg/m. If this is out of the aformentioned range listed, please consider follow up with your Primary Care Provider.

## 2017-02-26 NOTE — Progress Notes (Signed)
Agree with assessment and plan as outlined.  

## 2017-03-25 ENCOUNTER — Telehealth: Payer: Self-pay | Admitting: Gastroenterology

## 2017-03-25 ENCOUNTER — Encounter: Payer: Self-pay | Admitting: Gastroenterology

## 2017-03-25 NOTE — Telephone Encounter (Signed)
No that's okay, she can reschedule at her convenience

## 2017-03-26 ENCOUNTER — Encounter: Payer: Medicaid Other | Admitting: Gastroenterology

## 2017-04-02 ENCOUNTER — Ambulatory Visit (HOSPITAL_COMMUNITY)
Admission: EM | Admit: 2017-04-02 | Discharge: 2017-04-02 | Disposition: A | Discharge status: Home / Self Care | Payer: Medicaid Other | Attending: Family Medicine | Admitting: Family Medicine

## 2017-04-02 ENCOUNTER — Encounter (HOSPITAL_COMMUNITY): Payer: Self-pay | Admitting: Family Medicine

## 2017-04-02 DIAGNOSIS — N760 Acute vaginitis: Secondary | ICD-10-CM

## 2017-04-02 DIAGNOSIS — B9689 Other specified bacterial agents as the cause of diseases classified elsewhere: Secondary | ICD-10-CM

## 2017-04-02 LAB — POCT URINALYSIS DIP (DEVICE)
Bilirubin Urine: NEGATIVE
Glucose, UA: NEGATIVE mg/dL
HGB URINE DIPSTICK: NEGATIVE
Ketones, ur: NEGATIVE mg/dL
Leukocytes, UA: NEGATIVE
NITRITE: NEGATIVE
PH: 7 (ref 5.0–8.0)
PROTEIN: NEGATIVE mg/dL
Specific Gravity, Urine: 1.02 (ref 1.005–1.030)
UROBILINOGEN UA: 0.2 mg/dL (ref 0.0–1.0)

## 2017-04-02 LAB — POCT PREGNANCY, URINE: Preg Test, Ur: NEGATIVE

## 2017-04-02 MED ORDER — METRONIDAZOLE 500 MG PO TABS: 500.0000 mg | ORAL_TABLET | Freq: Two times a day (BID) | ORAL | 0 refills | Status: DC

## 2017-04-02 NOTE — ED Triage Notes (Signed)
Pt here for reoccurrence UTI and possible bacterial infection.

## 2017-04-03 NOTE — ED Provider Notes (Signed)
  [facility]   [id number] 04/02/17 Arrival Time: 1944  ASSESSMENT & PLAN:  Today you were diagnosed with the following: 1. Bacterial vaginosis   Recurrent.  You have been prescribed prescription medications this visit  Please take and complete all medications as prescribed.  If you are not improving over the next few days or feel you are worsening please follow up here or the Emergency Department if you are unable to see your regular doctor.  Meds ordered this encounter  Medications  . metroNIDAZOLE (FLAGYL) 500 MG tablet    Sig: Take 1 tablet (500 mg total) by mouth 2 (two) times daily.    Dispense:  14 tablet    Refill:  0   No STI screening desired. Reviewed expectations re: course of current medical issues. Questions answered. Outlined signs and symptoms indicating need for more acute intervention. Patient verbalized understanding. After Visit Summary given.   SUBJECTIVE:  Stephanie [name] is a 36 y.o. female who presents with complaint of vaginal discharge for a few weeks. Mild lower back discomfort and has questioned off/on urinary frequency over the past week (none now). Vaginal d/c described as thin and watery. No itching. Mild odor. No vaginal bleeding. No pelvic or abdominal pain. No n/v. Has had BV before and reports symptoms are the same.  ROS: As per HPI.   OBJECTIVE:  Vitals:   04/02/17 2013  BP: (!) 103/53  Pulse: 82  Resp: 18  Temp: 98.6 F (37 C)  SpO2: 100%    General appearance: alert; no distress Abdomen: soft, non-tender; no guarding or rebound tenderness Back: no CVA tenderness Skin: warm and dry  Results for orders placed or performed during the hospital encounter of 04/02/17  POCT urinalysis dip (device)  Result Value Ref Range   Glucose, UA NEGATIVE NEGATIVE mg/dL   Bilirubin Urine NEGATIVE NEGATIVE   Ketones, ur NEGATIVE NEGATIVE mg/dL   Specific Gravity, Urine 1.020 1.005 - 1.030   Hgb urine dipstick NEGATIVE NEGATIVE    pH 7.0 5.0 - 8.0   Protein, ur NEGATIVE NEGATIVE mg/dL   Urobilinogen, UA 0.2 0.0 - 1.0 mg/dL   Nitrite NEGATIVE NEGATIVE   Leukocytes, UA NEGATIVE NEGATIVE  Pregnancy, urine POC  Result Value Ref Range   Preg Test, Ur NEGATIVE NEGATIVE    No Known Allergies  PMHx, SurgHx, SocialHx, Medications, and Allergies were reviewed in the Visit Navigator and updated as appropriate.      Vanessa Kick, MD 04/03/17 [phone]

## 2017-05-26 ENCOUNTER — Encounter: Payer: Medicaid Other | Admitting: Gastroenterology

## 2017-07-29 ENCOUNTER — Encounter (HOSPITAL_COMMUNITY): Payer: Self-pay | Admitting: *Deleted

## 2017-07-29 ENCOUNTER — Ambulatory Visit (HOSPITAL_COMMUNITY)
Admission: EM | Admit: 2017-07-29 | Discharge: 2017-07-29 | Disposition: A | Discharge status: Home / Self Care | Payer: Self-pay | Attending: Family Medicine | Admitting: Family Medicine

## 2017-07-29 DIAGNOSIS — B952 Enterococcus as the cause of diseases classified elsewhere: Secondary | ICD-10-CM | POA: Insufficient documentation

## 2017-07-29 DIAGNOSIS — N760 Acute vaginitis: Secondary | ICD-10-CM | POA: Insufficient documentation

## 2017-07-29 DIAGNOSIS — R300 Dysuria: Secondary | ICD-10-CM

## 2017-07-29 DIAGNOSIS — B9689 Other specified bacterial agents as the cause of diseases classified elsewhere: Secondary | ICD-10-CM | POA: Insufficient documentation

## 2017-07-29 LAB — POCT URINALYSIS DIP (DEVICE)
BILIRUBIN URINE: NEGATIVE
GLUCOSE, UA: NEGATIVE mg/dL
Hgb urine dipstick: NEGATIVE
Ketones, ur: NEGATIVE mg/dL
NITRITE: NEGATIVE
PH: 6.5 (ref 5.0–8.0)
PROTEIN: NEGATIVE mg/dL
Specific Gravity, Urine: 1.015 (ref 1.005–1.030)
Urobilinogen, UA: 0.2 mg/dL (ref 0.0–1.0)

## 2017-07-29 MED ORDER — METRONIDAZOLE 500 MG PO TABS: 500.0000 mg | ORAL_TABLET | Freq: Two times a day (BID) | ORAL | 0 refills | Status: DC

## 2017-07-29 MED ORDER — SULFAMETHOXAZOLE-TRIMETHOPRIM 800-160 MG PO TABS: 1.0000 | ORAL_TABLET | Freq: Two times a day (BID) | ORAL | 0 refills | Status: DC

## 2017-07-29 NOTE — ED Triage Notes (Signed)
Patient reports she has been taking baths and she thinks they have caused a UTI/BV. Patient reports history of both. Patient reports vaginal odor and polyuria.

## 2017-07-30 LAB — URINE CYTOLOGY ANCILLARY ONLY
CHLAMYDIA, DNA PROBE: NEGATIVE
NEISSERIA GONORRHEA: NEGATIVE
Trichomonas: NEGATIVE

## 2017-08-01 LAB — URINE CULTURE

## 2017-08-01 NOTE — ED Provider Notes (Signed)
  [name]    ASSESSMENT & PLAN:  1. Dysuria   2. BV (bacterial vaginosis)     Meds ordered this encounter  Medications  . sulfamethoxazole-trimethoprim (BACTRIM DS,SEPTRA DS) 800-160 MG tablet    Sig: Take 1 tablet by mouth 2 (two) times daily.    Dispense:  10 tablet    Refill:  0  . metroNIDAZOLE (FLAGYL) 500 MG tablet    Sig: Take 1 tablet (500 mg total) by mouth 2 (two) times daily.    Dispense:  14 tablet    Refill:  0   Urine cytology sent at her request. Will notify of any positive results. Will follow up with her PCP or here if not showing improvement over the next 48 hours, sooner if needed.  Outlined signs and symptoms indicating need for more acute intervention. Patient verbalized understanding. After Visit Summary given.  SUBJECTIVE:  Stephanie [name] is a 36 y.o. female who complains of urinary frequency, urgency and dysuria for the past several days. H/O UTI and "this feels just like it." No flank pain, fever, chills, abnormal vaginal discharge or bleeding. Hematuria: not present.  Normal PO intake. No flank or abdominal pain. No self treatment. Requests STD screening. H/O BV and reports a thin vaginal discharge for a few days. Slight odor.  LMP: No LMP recorded. Patient is not currently having periods (Reason: IUD).  ROS: As in HPI.  OBJECTIVE:  Vitals:   07/29/17 1810  BP: 106/84  Pulse: 98  Resp: 16  Temp: 98.6 F (37 C)  TempSrc: Oral  SpO2: 100%    Appears well, in no apparent distress. Abdomen is soft without tenderness, guarding, mass, rebound or organomegaly. No CVA tenderness or inguinal adenopathy noted.  Labs Reviewed  POCT URINALYSIS DIP (DEVICE) - Abnormal; Notable for the following:    Leukocytes, UA TRACE (*)    All other components within normal limits  URINE CYTOLOGY ANCILLARY ONLY    No Known Allergies  Past Medical History:  Diagnosis Date  . BV (bacterial vaginosis)   . UTI (lower urinary tract  infection)    Social History   Social History  . Marital status: Single    Spouse name: N/A  . Number of children: N/A  . Years of education: N/A   Occupational History  . Not on file.   Social History Main Topics  . Smoking status: Current Some Day Smoker    Packs/day: 0.50    Years: 10.00    Types: Cigarettes  . Smokeless tobacco: Never Used  . Alcohol use No     Comment: seldom  . Drug use: No  . Sexual activity: Yes    Birth control/ protection: None, Other-see comments     Comment: nuva ring   Other Topics Concern  . Not on file   Social History Narrative  . No narrative on file   Family History  Problem Relation Age of Onset  . Heart disease Father   . Colon cancer Neg Hx   . Esophageal cancer Neg Hx        Vanessa Kick, MD 08/01/17 1155

## 2017-08-02 ENCOUNTER — Telehealth (HOSPITAL_COMMUNITY): Payer: Self-pay | Admitting: Internal Medicine

## 2017-08-02 MED ORDER — AMOXICILLIN 875 MG PO TABS: 875.0000 mg | ORAL_TABLET | Freq: Two times a day (BID) | ORAL | 0 refills | Status: AC

## 2017-08-02 NOTE — Telephone Encounter (Signed)
Clinical staff, please let patient know that urine culture was positive for Enterococcus germ, sensitive to amoxicillin but not typically sensitive to trimethoprim/sulfa rx given at the urgent care visit.  Stop trimethoprim/sulfa.  Rx amoxicillin sent to the pharmacy of record, [facility] on [address] at [place].  Take all of the amoxicillin.  Recheck or followup with PCP for further evaluation if symptoms are not improving.  LM

## 2017-08-03 LAB — URINE CYTOLOGY ANCILLARY ONLY: CANDIDA VAGINITIS: NEGATIVE

## 2017-12-10 ENCOUNTER — Encounter (HOSPITAL_COMMUNITY): Payer: Self-pay | Admitting: Family Medicine

## 2017-12-10 ENCOUNTER — Ambulatory Visit (HOSPITAL_COMMUNITY)
Admission: EM | Admit: 2017-12-10 | Discharge: 2017-12-10 | Disposition: A | Discharge status: Home / Self Care | Payer: Self-pay | Attending: Internal Medicine | Admitting: Internal Medicine

## 2017-12-10 DIAGNOSIS — J069 Acute upper respiratory infection, unspecified: Secondary | ICD-10-CM

## 2017-12-10 DIAGNOSIS — R350 Frequency of micturition: Secondary | ICD-10-CM

## 2017-12-10 LAB — POCT URINALYSIS DIP (DEVICE)
BILIRUBIN URINE: NEGATIVE
Glucose, UA: NEGATIVE mg/dL
HGB URINE DIPSTICK: NEGATIVE
Ketones, ur: NEGATIVE mg/dL
LEUKOCYTES UA: NEGATIVE
NITRITE: NEGATIVE
PH: 8.5 — AB (ref 5.0–8.0)
Protein, ur: NEGATIVE mg/dL
SPECIFIC GRAVITY, URINE: 1.015 (ref 1.005–1.030)
Urobilinogen, UA: 0.2 mg/dL (ref 0.0–1.0)

## 2017-12-10 LAB — POCT PREGNANCY, URINE: Preg Test, Ur: NEGATIVE

## 2017-12-10 MED ORDER — IBUPROFEN 800 MG PO TABS
ORAL_TABLET | ORAL | Status: AC
  Filled 2017-12-10: qty 1

## 2017-12-10 MED ORDER — IBUPROFEN 800 MG PO TABS
800.0000 mg | ORAL_TABLET | Freq: Once | ORAL | Status: AC
  Administered 2017-12-10: 800 mg via ORAL

## 2017-12-10 MED ORDER — ONDANSETRON 4 MG PO TBDP: 4.0000 mg | ORAL_TABLET | Freq: Three times a day (TID) | ORAL | 0 refills | Status: DC | PRN

## 2017-12-10 NOTE — ED Triage Notes (Signed)
Pt here for urinary frequency, fever, nausea and body aches. sts slight cough and chest tightness. She took thera flu.

## 2017-12-10 NOTE — ED Provider Notes (Signed)
[place]    CSN: [id number] Arrival date & time: 12/10/17  1435     History   Chief Complaint Chief Complaint  Patient presents with  . Urinary Frequency  . Nausea  . Fever    HPI Stephanie [name] is a 37 y.o. female no significant past medical history presenting today with concerns over urinary frequency and also chills, body aches, cough.  Patient states that over the past week she has had increased urinary frequency.  Denies dysuria or incomplete voiding.  States that she has a history of frequent UTIs.  She also notes that she has been drinking a lot of caffeine.  Denies vaginal discharge or pelvic pain, patient recently ending her menstrual cycle.  Patient also has had fever, nausea, cough and chills over the past couple of days.  She has no known recorded temperature, but is just felt cold.  She is taking daytime TheraFlu which has helped some.  Patient also having headache and feeling drained.  Also reporting that her hands feel swollen.  Denies vomiting, but does feel like her stomach is upset.  Denies diarrhea or abnormal bowel movements.  Patient has minimal other URI symptoms-denies congestion, rhinorrhea, sore throat.  HPI  Past Medical History:  Diagnosis Date  . BV (bacterial vaginosis)   . UTI (lower urinary tract infection)     There are no active problems to display for this patient.   Past Surgical History:  Procedure Laterality Date  . CESAREAN SECTION      OB History    Gravida Para Term Preterm AB Living   3 1 1   1 1    SAB TAB Ectopic Multiple Live Births     1             Home Medications    Prior to Admission medications   Medication Sig Start Date End Date Taking? Authorizing Provider  clonazePAM (KLONOPIN) 0.5 MG tablet Take 0.5 mg by mouth as needed for anxiety.    [provider]  FOCALIN XR 10 MG 24 hr capsule Take 10 mg by mouth every morning. 01/22/17   [provider]  ondansetron (ZOFRAN ODT) 4 MG  disintegrating tablet Take 1 tablet (4 mg total) by mouth every 8 (eight) hours as needed for nausea or vomiting. 12/10/17   Wieters, Elesa Hacker, PA-C    Family History Family History  Problem Relation Age of Onset  . Heart disease Father   . Colon cancer Neg Hx   . Esophageal cancer Neg Hx     Social History Social History   Tobacco Use  . Smoking status: Current Some Day Smoker    Packs/day: 0.50    Years: 10.00    Pack years: 5.00    Types: Cigarettes  . Smokeless tobacco: Never Used  Substance Use Topics  . Alcohol use: No    Comment: seldom  . Drug use: No     Allergies   Patient has no known allergies.   Review of Systems Review of Systems  Constitutional: Positive for chills, fatigue and fever.  HENT: Negative for congestion, ear pain, rhinorrhea, sinus pressure, sore throat and trouble swallowing.   Respiratory: Positive for cough and chest tightness. Negative for shortness of breath.   Cardiovascular: Negative for chest pain.  Gastrointestinal: Positive for nausea. Negative for abdominal pain, diarrhea and vomiting.  Musculoskeletal: Positive for myalgias.  Skin: Negative for rash.  Neurological: Positive for headaches. Negative for dizziness and light-headedness.  Physical Exam Triage Vital Signs ED Triage Vitals [12/10/17 1629]  Enc Vitals Group     BP      Pulse      Resp      Temp      Temp src      SpO2      Weight      Height      Head Circumference      Peak Flow      Pain Score 5     Pain Loc      Pain Edu?      Excl. in [place]?    No data found.  Updated Vital Signs There were no vitals taken for this visit.  Visual Acuity Right Eye Distance:   Left Eye Distance:   Bilateral Distance:    Right Eye Near:   Left Eye Near:    Bilateral Near:     Physical Exam  Constitutional: She appears well-developed and well-nourished. No distress.  HENT:  Head: Normocephalic and atraumatic.  Bilateral TMs not erythematous, nasal mucosa  nonerythematous without rhinorrhea, posterior oropharynx nonerythematous, no tonsillar enlargement or exudate.  Eyes: Conjunctivae are normal.  Neck: Neck supple.  Cardiovascular: Normal rate and regular rhythm.  No murmur heard. Pulmonary/Chest: Effort normal and breath sounds normal. No respiratory distress.  Breathing comfortably at rest, CTA BL  Abdominal: Soft. There is no tenderness.  Musculoskeletal: She exhibits no edema.  Neurological: She is alert.  Skin: Skin is warm and dry.  Psychiatric: She has a normal mood and affect.  Nursing note and vitals reviewed.    UC Treatments / Results  Labs (all labs ordered are listed, but only abnormal results are displayed) Labs Reviewed  POCT URINALYSIS DIP (DEVICE) - Abnormal; Notable for the following components:      Result Value   pH 8.5 (*)    All other components within normal limits  POCT PREGNANCY, URINE    EKG  EKG Interpretation None       Radiology No results found.  Procedures Procedures (including critical care time)  Medications Ordered in UC Medications  ibuprofen (ADVIL,MOTRIN) tablet 800 mg (not administered)     Initial Impression / Assessment and Plan / UC Course  I have reviewed the triage vital signs and the nursing notes.  Pertinent labs & imaging results that were available during my care of the patient were reviewed by me and considered in my medical decision making (see chart for details).     UA negative for infection.  Urinary frequency may be related to caffeine, discussed getting back to see if this helps.  Other symptoms seem to be viral versus drained from stress and staying busy with work.  Advised symptomatic management.  Zofran for nausea, anti-inflammatories for headache and fever, body aches.  Over-the-counter Delsym or Robitussin for cough as patient does not have insurance.  Discussed strict return precautions. Patient verbalized understanding and is agreeable with  plan.   Final Clinical Impressions(s) / UC Diagnoses   Final diagnoses:  Urinary frequency  Upper respiratory tract infection, unspecified type    ED Discharge Orders        Ordered    ondansetron (ZOFRAN ODT) 4 MG disintegrating tablet  Every 8 hours PRN     12/10/17 1719       Controlled Substance Prescriptions [place] Controlled Substance Registry consulted? Not Applicable   Janith Lima, [place] 12/10/17 1730

## 2017-12-10 NOTE — Discharge Instructions (Addendum)
For your headache, fevers, body aches, swelling, please take Tylenol and ibuprofen-you may alternate these every 4 hours  For cough I would recommend over-the-counter Delsym or Robitussin, you may also try honey mixed in tea.  You may use zofran as needed.   Please try to cut back on caffeine to see if this helps with your urinary frequency, but please continue to try to drink plenty of fluids.  Please try to get plenty of sleep and rest over the next couple of days.  Please return if symptoms not improving in 5-7 days, you develop worsening of symptoms, difficulty breathing, chest pain, other new symptoms.

## 2018-02-09 ENCOUNTER — Ambulatory Visit (HOSPITAL_COMMUNITY)
Admission: EM | Admit: 2018-02-09 | Discharge: 2018-02-09 | Disposition: A | Discharge status: Home / Self Care | Payer: Self-pay | Attending: Family Medicine | Admitting: Family Medicine

## 2018-02-09 ENCOUNTER — Encounter (HOSPITAL_COMMUNITY): Payer: Self-pay | Admitting: Emergency Medicine

## 2018-02-09 DIAGNOSIS — F17210 Nicotine dependence, cigarettes, uncomplicated: Secondary | ICD-10-CM | POA: Insufficient documentation

## 2018-02-09 DIAGNOSIS — R1032 Left lower quadrant pain: Secondary | ICD-10-CM | POA: Insufficient documentation

## 2018-02-09 DIAGNOSIS — Z79899 Other long term (current) drug therapy: Secondary | ICD-10-CM | POA: Insufficient documentation

## 2018-02-09 DIAGNOSIS — N898 Other specified noninflammatory disorders of vagina: Secondary | ICD-10-CM

## 2018-02-09 DIAGNOSIS — R1031 Right lower quadrant pain: Secondary | ICD-10-CM | POA: Insufficient documentation

## 2018-02-09 LAB — POCT URINALYSIS DIP (DEVICE)
Bilirubin Urine: NEGATIVE
GLUCOSE, UA: NEGATIVE mg/dL
HGB URINE DIPSTICK: NEGATIVE
Ketones, ur: NEGATIVE mg/dL
Leukocytes, UA: NEGATIVE
Nitrite: NEGATIVE
PH: 7 (ref 5.0–8.0)
Protein, ur: NEGATIVE mg/dL
SPECIFIC GRAVITY, URINE: 1.02 (ref 1.005–1.030)
UROBILINOGEN UA: 0.2 mg/dL (ref 0.0–1.0)

## 2018-02-09 LAB — POCT PREGNANCY, URINE: Preg Test, Ur: NEGATIVE

## 2018-02-09 MED ORDER — METRONIDAZOLE 500 MG PO TABS: 500.0000 mg | ORAL_TABLET | Freq: Two times a day (BID) | ORAL | 0 refills | Status: DC

## 2018-02-09 NOTE — Discharge Instructions (Signed)
No alarming signs on exam. Urine without infection. You were treated empirically for bacterial vaginitis. Start flagyl as directed. Cytology sent, you will be contacted with any positive results that requires further treatment. Refrain from sexual activity and alcohol use for the next 7 days. Monitor your abdominal pain closely, if worsening abdominal pain, fever, nausea/vomiting, unable to walk/jump due to abdominal pain, go to the emergency department for further evaluation needed.

## 2018-02-09 NOTE — ED Triage Notes (Signed)
Pt sts abd pain; pt sts sx of UTI also and vaginal odor

## 2018-02-09 NOTE — ED Provider Notes (Signed)
Stephanie [name]    CSN: [id number] Arrival date & time: 02/09/18  1023     History   Chief Complaint Chief Complaint  Patient presents with  . Abdominal Pain    HPI Stephanie [name] is a 37 y.o. female.   37 year old female comes in for 2-3 day history of urinary symptoms and vaginal odor.  States she has urinary frequency but denies dysuria, urgency, hematuria.  Vaginal odor without obvious discharge, itching, pain.  States low abdominal pain that is intermittent, sharp without aggravating or alleviating factor.  Denies nausea, vomiting, diarrhea.  Last bowel movement this morning, with mild straining.  Denies fever, chills, night sweats.  Last sexual activity 1 month ago, one female partner, occasional condom use.  Has an IUD placed, LMP 02/06/2018.     Past Medical History:  Diagnosis Date  . BV (bacterial vaginosis)   . UTI (lower urinary tract infection)     There are no active problems to display for this patient.   Past Surgical History:  Procedure Laterality Date  . CESAREAN SECTION      OB History    Gravida  3   Para  1   Term  1   Preterm      AB  1   Living  1     SAB      TAB  1   Ectopic      Multiple      Live Births               Home Medications    Prior to Admission medications   Medication Sig Start Date End Date Taking? Authorizing Provider  clonazePAM (KLONOPIN) 0.5 MG tablet Take 0.5 mg by mouth as needed for anxiety.    [provider]  FOCALIN XR 10 MG 24 hr capsule Take 10 mg by mouth every morning. 01/22/17   [provider]  metroNIDAZOLE (FLAGYL) 500 MG tablet Take 1 tablet (500 mg total) by mouth 2 (two) times daily. 02/09/18   Tasia Catchings, Monica Codd V, PA-C  ondansetron (ZOFRAN ODT) 4 MG disintegrating tablet Take 1 tablet (4 mg total) by mouth every 8 (eight) hours as needed for nausea or vomiting. 12/10/17   Wieters, Elesa Hacker, PA-C    Family History Family History  Problem Relation Age of Onset  .  Heart disease Father   . Colon cancer Neg Hx   . Esophageal cancer Neg Hx     Social History Social History   Tobacco Use  . Smoking status: Current Some Day Smoker    Packs/day: 0.50    Years: 10.00    Pack years: 5.00    Types: Cigarettes  . Smokeless tobacco: Never Used  Substance Use Topics  . Alcohol use: No    Comment: seldom  . Drug use: No     Allergies   Patient has no known allergies.   Review of Systems Review of Systems  Reason unable to perform ROS: See HPI as above.     Physical Exam Triage Vital Signs ED Triage Vitals [02/09/18 1110]  Enc Vitals Group     BP 107/65     Pulse Rate 72     Resp 18     Temp 98.4 F (36.9 C)     Temp Source Oral     SpO2 100 %     Weight      Height      Head Circumference  Peak Flow      Pain Score      Pain Loc      Pain Edu?      Excl. in [place]?    No data found.  Updated Vital Signs BP 107/65 (BP Location: Right Arm)   Pulse 72   Temp 98.4 F (36.9 C) (Oral)   Resp 18   SpO2 100%   Physical Exam  Constitutional: She is oriented to person, place, and time. She appears well-developed and well-nourished. No distress.  HENT:  Head: Normocephalic and atraumatic.  Eyes: Pupils are equal, round, and reactive to light. Conjunctivae are normal.  Cardiovascular: Normal rate, regular rhythm and normal heart sounds. Exam reveals no gallop and no friction rub.  No murmur heard. Pulmonary/Chest: Effort normal and breath sounds normal. She has no wheezes. She has no rales.  Abdominal: Soft. Bowel sounds are normal. She exhibits no mass. There is no rigidity, no rebound, no guarding and no CVA tenderness.  Patient with tensing when exam first started. With distraction, soft abdomen without tenderness to palpation.   Neurological: She is alert and oriented to person, place, and time.  Skin: Skin is warm and dry.  Psychiatric: She has a normal mood and affect. Her behavior is normal. Judgment normal.     UC  Treatments / Results  Labs (all labs ordered are listed, but only abnormal results are displayed) Labs Reviewed  POCT URINALYSIS DIP (DEVICE)  POCT PREGNANCY, URINE  CERVICOVAGINAL ANCILLARY ONLY    EKG None  Radiology No results found.  Procedures Procedures (including critical care time)  Medications Ordered in UC Medications - No data to display  Initial Impression / Assessment and Plan / UC Course  I have reviewed the triage vital signs and the nursing notes.  Pertinent labs & imaging results that were available during my care of the patient were reviewed by me and considered in my medical decision making (see chart for details).    No alarming signs.  Will treat empirically for bacterial vaginitis.  Start Flagyl as directed.  Cytology sent.  Push fluids.  Return precautions given.  Patient expresses understanding and agrees to plan.  Final Clinical Impressions(s) / UC Diagnoses   Final diagnoses:  Vaginal odor  Left lower quadrant pain  Right lower quadrant abdominal pain    ED Prescriptions    Medication Sig Dispense Auth. Provider   metroNIDAZOLE (FLAGYL) 500 MG tablet Take 1 tablet (500 mg total) by mouth 2 (two) times daily. 14 tablet Tobin Chad, [place] 02/09/18 1240

## 2018-02-10 LAB — CERVICOVAGINAL ANCILLARY ONLY
BACTERIAL VAGINITIS: POSITIVE — AB
CANDIDA VAGINITIS: NEGATIVE
CHLAMYDIA, DNA PROBE: NEGATIVE
NEISSERIA GONORRHEA: NEGATIVE
Trichomonas: NEGATIVE

## 2018-02-18 ENCOUNTER — Encounter (HOSPITAL_COMMUNITY): Payer: Self-pay | Admitting: Emergency Medicine

## 2018-02-18 ENCOUNTER — Other Ambulatory Visit: Payer: Self-pay

## 2018-02-18 ENCOUNTER — Emergency Department (HOSPITAL_COMMUNITY): Payer: Self-pay

## 2018-02-18 ENCOUNTER — Emergency Department (HOSPITAL_COMMUNITY)
Admission: EM | Admit: 2018-02-18 | Discharge: 2018-02-18 | Disposition: A | Discharge status: Home / Self Care | Payer: Self-pay | Attending: Emergency Medicine | Admitting: Emergency Medicine

## 2018-02-18 DIAGNOSIS — K625 Hemorrhage of anus and rectum: Secondary | ICD-10-CM | POA: Insufficient documentation

## 2018-02-18 DIAGNOSIS — F17210 Nicotine dependence, cigarettes, uncomplicated: Secondary | ICD-10-CM | POA: Insufficient documentation

## 2018-02-18 DIAGNOSIS — Z79899 Other long term (current) drug therapy: Secondary | ICD-10-CM | POA: Insufficient documentation

## 2018-02-18 DIAGNOSIS — R1032 Left lower quadrant pain: Secondary | ICD-10-CM | POA: Insufficient documentation

## 2018-02-18 LAB — CBC WITH DIFFERENTIAL/PLATELET
BASOS ABS: 0 10*3/uL (ref 0.0–0.1)
Basophils Relative: 0 %
Eosinophils Absolute: 0.1 10*3/uL (ref 0.0–0.7)
Eosinophils Relative: 2 %
HEMATOCRIT: 40.3 % (ref 36.0–46.0)
Hemoglobin: 13.5 g/dL (ref 12.0–15.0)
Lymphocytes Relative: 29 %
Lymphs Abs: 2.4 10*3/uL (ref 0.7–4.0)
MCH: 29.2 pg (ref 26.0–34.0)
MCHC: 33.5 g/dL (ref 30.0–36.0)
MCV: 87 fL (ref 78.0–100.0)
MONO ABS: 0.8 10*3/uL (ref 0.1–1.0)
Monocytes Relative: 10 %
Neutro Abs: 4.9 10*3/uL (ref 1.7–7.7)
Neutrophils Relative %: 59 %
Platelets: 208 10*3/uL (ref 150–400)
RBC: 4.63 MIL/uL (ref 3.87–5.11)
RDW: 13.2 % (ref 11.5–15.5)
WBC: 8.3 10*3/uL (ref 4.0–10.5)

## 2018-02-18 LAB — URINALYSIS, ROUTINE W REFLEX MICROSCOPIC
Bacteria, UA: NONE SEEN
Bilirubin Urine: NEGATIVE
Glucose, UA: NEGATIVE mg/dL
Hgb urine dipstick: NEGATIVE
Ketones, ur: NEGATIVE mg/dL
Nitrite: NEGATIVE
Protein, ur: NEGATIVE mg/dL
SPECIFIC GRAVITY, URINE: 1.019 (ref 1.005–1.030)
pH: 7 (ref 5.0–8.0)

## 2018-02-18 LAB — POC OCCULT BLOOD, ED: Fecal Occult Bld: POSITIVE — AB

## 2018-02-18 LAB — COMPREHENSIVE METABOLIC PANEL
ALT: 13 U/L — AB (ref 14–54)
AST: 17 U/L (ref 15–41)
Albumin: 3.8 g/dL (ref 3.5–5.0)
Alkaline Phosphatase: 53 U/L (ref 38–126)
Anion gap: 8 (ref 5–15)
BILIRUBIN TOTAL: 0.2 mg/dL — AB (ref 0.3–1.2)
BUN: 8 mg/dL (ref 6–20)
CO2: 23 mmol/L (ref 22–32)
CREATININE: 0.72 mg/dL (ref 0.44–1.00)
Calcium: 8.9 mg/dL (ref 8.9–10.3)
Chloride: 108 mmol/L (ref 101–111)
Glucose, Bld: 92 mg/dL (ref 65–99)
Potassium: 3.8 mmol/L (ref 3.5–5.1)
Sodium: 139 mmol/L (ref 135–145)
TOTAL PROTEIN: 6.6 g/dL (ref 6.5–8.1)

## 2018-02-18 LAB — PREGNANCY, URINE: PREG TEST UR: NEGATIVE

## 2018-02-18 LAB — LIPASE, BLOOD: LIPASE: 41 U/L (ref 11–51)

## 2018-02-18 MED ORDER — IOPAMIDOL (ISOVUE-300) INJECTION 61%
100.0000 mL | Freq: Once | INTRAVENOUS | Status: AC | PRN
  Administered 2018-02-18: 100 mL via INTRAVENOUS

## 2018-02-18 MED ORDER — HYDROCORTISONE ACETATE 25 MG RE SUPP: 25.0000 mg | Freq: Two times a day (BID) | RECTAL | 0 refills | Status: DC

## 2018-02-18 MED ORDER — ONDANSETRON 4 MG PO TBDP: 4.0000 mg | ORAL_TABLET | Freq: Three times a day (TID) | ORAL | 0 refills | Status: DC | PRN

## 2018-02-18 MED ORDER — SODIUM CHLORIDE 0.9 % IV BOLUS
1000.0000 mL | Freq: Once | INTRAVENOUS | Status: AC
  Administered 2018-02-18: 1000 mL via INTRAVENOUS

## 2018-02-18 MED ORDER — IOPAMIDOL (ISOVUE-300) INJECTION 61%
INTRAVENOUS | Status: AC
  Filled 2018-02-18: qty 100

## 2018-02-18 MED ORDER — DICYCLOMINE HCL 20 MG PO TABS: 20.0000 mg | ORAL_TABLET | Freq: Two times a day (BID) | ORAL | 0 refills | Status: DC

## 2018-02-18 MED ORDER — ONDANSETRON HCL 4 MG/2ML IJ SOLN
4.0000 mg | Freq: Once | INTRAMUSCULAR | Status: AC
  Administered 2018-02-18: 4 mg via INTRAVENOUS
  Filled 2018-02-18: qty 2

## 2018-02-18 NOTE — ED Triage Notes (Signed)
Pt is c/o abd pain off and on for the past 2 weeks  Pt states she has noticed some blood in her stool  Pt states today she feels fatigued and tired

## 2018-02-18 NOTE — ED Provider Notes (Signed)
[facility] DEPT Provider Note   CSN: [id number] Arrival date & time: 02/18/18  2102     History   Chief Complaint Chief Complaint  Patient presents with  . Abdominal Pain    HPI Stephanie [name] is a 37 y.o. female.  Pt presents to the ED today with abdominal pain.  Pt said pain has been going on and off for the last 2 weeks.  She went to urgent care on 5/14 for the same.  She did have a pelvic exam then which showed BV.  She has not had sex since then.  She has an IUD.  She did not improve with treatment of the BV.  Pt said she's felt fatigued and had some blood in her stool today.  No fever/chills.  No n/v.     Past Medical History:  Diagnosis Date  . BV (bacterial vaginosis)   . UTI (lower urinary tract infection)     There are no active problems to display for this patient.   Past Surgical History:  Procedure Laterality Date  . CESAREAN SECTION       OB History    Gravida  3   Para  1   Term  1   Preterm      AB  1   Living  1     SAB      TAB  1   Ectopic      Multiple      Live Births               Home Medications    Prior to Admission medications   Medication Sig Start Date End Date Taking? Authorizing Provider  amphetamine-dextroamphetamine (ADDERALL) 10 MG tablet Take 10 mg by mouth daily. 02/10/18  Yes [provider]  clonazePAM (KLONOPIN) 0.5 MG tablet Take 0.5 mg by mouth daily as needed for anxiety.    Yes [provider]  metroNIDAZOLE (FLAGYL) 500 MG tablet Take 1 tablet (500 mg total) by mouth 2 (two) times daily. 02/09/18  Yes Yu, Amy V, PA-C  dicyclomine (BENTYL) 20 MG tablet Take 1 tablet (20 mg total) by mouth 2 (two) times daily. 02/18/18   Isla Pence, MD  hydrocortisone (ANUSOL-HC) 25 MG suppository Place 1 suppository (25 mg total) rectally 2 (two) times daily. 02/18/18   Isla Pence, MD  ondansetron (ZOFRAN ODT) 4 MG disintegrating tablet Take 1 tablet (4 mg  total) by mouth every 8 (eight) hours as needed. 02/18/18   Isla Pence, MD    Family History Family History  Problem Relation Age of Onset  . Heart disease Father   . Colon cancer Neg Hx   . Esophageal cancer Neg Hx     Social History Social History   Tobacco Use  . Smoking status: Current Some Day Smoker    Packs/day: 0.50    Years: 10.00    Pack years: 5.00    Types: Cigarettes  . Smokeless tobacco: Never Used  Substance Use Topics  . Alcohol use: No    Comment: seldom  . Drug use: No     Allergies   Patient has no known allergies.   Review of Systems Review of Systems  Gastrointestinal: Positive for abdominal pain and blood in stool.  All other systems reviewed and are negative.    Physical Exam Updated Vital Signs BP 136/85 (BP Location: Left Arm)   Pulse 73   Temp 98.4 F (36.9 C) (Oral)   Resp 15  Ht 5' 9.5" (1.765 m)   Wt 74.8 kg (165 lb)   LMP 02/05/2018 (Approximate)   SpO2 100%   BMI 24.02 kg/m   Physical Exam  Constitutional: She is oriented to person, place, and time. She appears well-developed and well-nourished.  HENT:  Head: Normocephalic and atraumatic.  Eyes: Pupils are equal, round, and reactive to light. EOM are normal.  Cardiovascular: Normal rate, regular rhythm, normal heart sounds and intact distal pulses.  Pulmonary/Chest: Effort normal and breath sounds normal.  Abdominal: Soft. Normal appearance and bowel sounds are normal. There is tenderness in the left lower quadrant.  Genitourinary: Rectal exam shows guaiac positive stool. Rectal exam shows no external hemorrhoid.  Neurological: She is alert and oriented to person, place, and time.  Skin: Skin is warm. Capillary refill takes less than 2 seconds.  Psychiatric: She has a normal mood and affect. Her behavior is normal.  Nursing note and vitals reviewed.  Pt requested no pelvic exam as she just had one done at urgent care.    ED Treatments / Results  Labs (all labs  ordered are listed, but only abnormal results are displayed) Labs Reviewed  COMPREHENSIVE METABOLIC PANEL - Abnormal; Notable for the following components:      Result Value   ALT 13 (*)    Total Bilirubin 0.2 (*)    All other components within normal limits  URINALYSIS, ROUTINE W REFLEX MICROSCOPIC - Abnormal; Notable for the following components:   APPearance CLOUDY (*)    Leukocytes, UA MODERATE (*)    All other components within normal limits  POC OCCULT BLOOD, ED - Abnormal; Notable for the following components:   Fecal Occult Bld POSITIVE (*)    All other components within normal limits  CBC WITH DIFFERENTIAL/PLATELET  LIPASE, BLOOD  PREGNANCY, URINE    EKG None  Radiology Ct Abdomen Pelvis W Contrast  Result Date: 02/18/2018 CLINICAL DATA:  Diffuse abdominal pain with blood in stool EXAM: CT ABDOMEN AND PELVIS WITH CONTRAST TECHNIQUE: Multidetector CT imaging of the abdomen and pelvis was performed using the standard protocol following bolus administration of intravenous contrast. CONTRAST:  153mL ISOVUE-300 IOPAMIDOL (ISOVUE-300) INJECTION 61% COMPARISON:  01/08/2017 CT FINDINGS: Lower chest: No acute abnormality. Hepatobiliary: Contracted gallbladder. No focal hepatic abnormality or biliary dilatation Pancreas: Unremarkable. No pancreatic ductal dilatation or surrounding inflammatory changes. Spleen: Normal in size without focal abnormality. Adrenals/Urinary Tract: Adrenal glands are unremarkable. Kidneys are normal, without renal calculi, focal lesion, or hydronephrosis. Bladder is unremarkable. Stomach/Bowel: Stomach is within normal limits. Appendix appears normal. No evidence of bowel wall thickening, distention, or inflammatory changes. Vascular/Lymphatic: No significant vascular findings are present. No enlarged abdominal or pelvic lymph nodes. Prominent parauterine vessels. Reproductive: Uterus unremarkable. High-riding left ovary with rim enhancing slightly complex cysts.  Other: Negative for free air. Small amount of free fluid in the pelvis. Fat in the umbilical region Musculoskeletal: Degenerative changes at L5-S1. No acute or suspicious abnormality IMPRESSION: 1. No CT evidence for acute intra-abdominal or pelvic abnormality. 2. Small amount of free fluid in the pelvis 3. 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 Electronically Signed   By: Donavan Foil M.D.   On: 02/18/2018 23:07    Procedures Procedures (including critical care time)  Medications Ordered in ED Medications  iopamidol (ISOVUE-300) 61 % injection (has no administration in time range)  ondansetron (ZOFRAN) injection 4 mg (4 mg Intravenous Given 02/18/18 2147)  sodium chloride 0.9 % bolus 1,000 mL (0  mLs Intravenous Stopped 02/18/18 2248)  iopamidol (ISOVUE-300) 61 % injection 100 mL (100 mLs Intravenous Contrast Given 02/18/18 2243)     Initial Impression / Assessment and Plan / ED Course  I have reviewed the triage vital signs and the nursing notes.  Pertinent labs & imaging results that were available during my care of the patient were reviewed by me and considered in my medical decision making (see chart for details).    Pt is feeling better.  She is encouraged to f/u with gi and with gyn.  Return if worse.  Final Clinical Impressions(s) / ED Diagnoses   Final diagnoses:  Left lower quadrant pain  Rectal bleeding    ED Discharge Orders        Ordered    dicyclomine (BENTYL) 20 MG tablet  2 times daily     02/18/18 2315    ondansetron (ZOFRAN ODT) 4 MG disintegrating tablet  Every 8 hours PRN     02/18/18 2315    hydrocortisone (ANUSOL-HC) 25 MG suppository  2 times daily     02/18/18 2315       Isla Pence, MD 02/18/18 2325

## 2018-05-24 ENCOUNTER — Emergency Department (HOSPITAL_COMMUNITY)
Admission: EM | Admit: 2018-05-24 | Discharge: 2018-05-24 | Disposition: A | Discharge status: Home / Self Care | Payer: Self-pay | Attending: Emergency Medicine | Admitting: Emergency Medicine

## 2018-05-24 DIAGNOSIS — Z5321 Procedure and treatment not carried out due to patient leaving prior to being seen by health care provider: Secondary | ICD-10-CM | POA: Insufficient documentation

## 2018-05-24 NOTE — ED Notes (Signed)
I called patient name in the lobby for triage and no one responded

## 2018-05-24 NOTE — ED Notes (Signed)
I called patient in the lobby for triage and no on responded

## 2018-05-26 ENCOUNTER — Encounter (HOSPITAL_COMMUNITY): Payer: Self-pay | Admitting: Emergency Medicine

## 2018-05-26 ENCOUNTER — Ambulatory Visit (HOSPITAL_COMMUNITY)
Admission: EM | Admit: 2018-05-26 | Discharge: 2018-05-26 | Disposition: A | Discharge status: Home / Self Care | Payer: Self-pay | Attending: Family Medicine | Admitting: Family Medicine

## 2018-05-26 ENCOUNTER — Other Ambulatory Visit: Payer: Self-pay

## 2018-05-26 DIAGNOSIS — N898 Other specified noninflammatory disorders of vagina: Secondary | ICD-10-CM

## 2018-05-26 DIAGNOSIS — Z3202 Encounter for pregnancy test, result negative: Secondary | ICD-10-CM

## 2018-05-26 DIAGNOSIS — R1032 Left lower quadrant pain: Secondary | ICD-10-CM

## 2018-05-26 DIAGNOSIS — N739 Female pelvic inflammatory disease, unspecified: Secondary | ICD-10-CM

## 2018-05-26 DIAGNOSIS — R102 Pelvic and perineal pain: Secondary | ICD-10-CM

## 2018-05-26 DIAGNOSIS — F17210 Nicotine dependence, cigarettes, uncomplicated: Secondary | ICD-10-CM | POA: Insufficient documentation

## 2018-05-26 LAB — POCT URINALYSIS DIP (DEVICE)
Bilirubin Urine: NEGATIVE
Glucose, UA: NEGATIVE mg/dL
Ketones, ur: NEGATIVE mg/dL
Nitrite: NEGATIVE
PH: 7 (ref 5.0–8.0)
Protein, ur: NEGATIVE mg/dL
SPECIFIC GRAVITY, URINE: 1.015 (ref 1.005–1.030)
UROBILINOGEN UA: 0.2 mg/dL (ref 0.0–1.0)

## 2018-05-26 MED ORDER — KETOROLAC TROMETHAMINE 60 MG/2ML IM SOLN
60.0000 mg | Freq: Once | INTRAMUSCULAR | Status: AC
  Administered 2018-05-26: 60 mg via INTRAMUSCULAR

## 2018-05-26 MED ORDER — AZITHROMYCIN 250 MG PO TABS
1000.0000 mg | ORAL_TABLET | Freq: Once | ORAL | Status: AC
  Administered 2018-05-26: 1000 mg via ORAL

## 2018-05-26 MED ORDER — KETOROLAC TROMETHAMINE 60 MG/2ML IM SOLN
INTRAMUSCULAR | Status: AC
  Filled 2018-05-26: qty 2

## 2018-05-26 MED ORDER — CEFTRIAXONE SODIUM 250 MG IJ SOLR
INTRAMUSCULAR | Status: AC
  Filled 2018-05-26: qty 250

## 2018-05-26 MED ORDER — AZITHROMYCIN 250 MG PO TABS
ORAL_TABLET | ORAL | Status: AC
  Filled 2018-05-26: qty 4

## 2018-05-26 MED ORDER — LIDOCAINE HCL (PF) 1 % IJ SOLN
INTRAMUSCULAR | Status: AC
  Filled 2018-05-26: qty 2

## 2018-05-26 MED ORDER — CEFTRIAXONE SODIUM 250 MG IJ SOLR
250.0000 mg | Freq: Once | INTRAMUSCULAR | Status: AC
  Administered 2018-05-26: 250 mg via INTRAMUSCULAR

## 2018-05-26 MED ORDER — DOXYCYCLINE HYCLATE 100 MG PO CAPS: 100.0000 mg | ORAL_CAPSULE | Freq: Two times a day (BID) | ORAL | 0 refills | Status: AC

## 2018-05-26 NOTE — Discharge Instructions (Addendum)
Rest and push fluids Given rocephin 250mg  injection and azithromycin 1g in office Cervical swab obtained Prescribed doxycycline.  We will treat you today for pelvic inflammatory disease Take medications as prescribed and to completion We will follow up with you regarding the results of your test If tests are positive, please abstain from sexual activity for at least 7 days and notify partners If you have any new or worsening symptoms go to [facility] hospital for further evaluation and management such as worsening abdominal or pelvic pain, fever, chills, nausea, vomiting, or if you symptoms do not improve with treatment, etc..

## 2018-05-26 NOTE — ED Triage Notes (Signed)
For a week has had intermittent pain for a week.  Pain is becoming more consistent.  Patient has been diagnosed with a cyst on the left ovary Patient has lower back pain, urinating frequently, odor to urine.

## 2018-05-26 NOTE — ED Provider Notes (Signed)
[place]   CC: Abdominal discomfort   SUBJECTIVE:  Stephanie [name] is a 37 y.o. female who complains of worsening abdominal discomfort for the past couple of weeks.  Admits to recent sexual activity.  Last unprotected sex this past week.  Sexually active with 1 new female partner.   Localizes the pain to the left lower quadrant Pain is constant and describes it as sharp and achy.  Pain is 8/10.  Has tried OTC antiinflammatories without relief.  Denies worsening symptoms.  Denies similar symptoms in the past.  Complains of nausea, vomiting with 3-4 episodes within the past week, straining with BM, increased urinary urgency and frequency.  Denies fever, chills, hematochezia, melena, abnormal vaginal discharge, vaginal discharge, dyspareunia, vaginal bleeding, or hematuria.    Last BM today.    Hx of ovarian cyst on left side. Few months ago.  Similar symptoms.      LMP: Patient's last menstrual period was 05/19/2018.  LMP 05/21/18.  Patient has IUD.    ROS: As in HPI.  Past Medical History:  Diagnosis Date  . BV (bacterial vaginosis)   . UTI (lower urinary tract infection)    Past Surgical History:  Procedure Laterality Date  . CESAREAN SECTION     No Known Allergies No current facility-administered medications on file prior to encounter.    Current Outpatient Medications on File Prior to Encounter  Medication Sig Dispense Refill  . amphetamine-dextroamphetamine (ADDERALL) 10 MG tablet Take 10 mg by mouth daily.  0  . clonazePAM (KLONOPIN) 0.5 MG tablet Take 0.5 mg by mouth daily as needed for anxiety.     . dicyclomine (BENTYL) 20 MG tablet Take 1 tablet (20 mg total) by mouth 2 (two) times daily. 20 tablet 0  . hydrocortisone (ANUSOL-HC) 25 MG suppository Place 1 suppository (25 mg total) rectally 2 (two) times daily. 12 suppository 0   Social History   Socioeconomic History  . Marital status: Single    Spouse name: Not on file  . Number of children: Not on  file  . Years of education: Not on file  . Highest education level: Not on file  Occupational History  . Not on file  Social Needs  . Financial resource strain: Not on file  . Food insecurity:    Worry: Not on file    Inability: Not on file  . Transportation needs:    Medical: Not on file    Non-medical: Not on file  Tobacco Use  . Smoking status: Current Some Day Smoker    Packs/day: 0.50    Years: 10.00    Pack years: 5.00    Types: Cigarettes  . Smokeless tobacco: Never Used  Substance and Sexual Activity  . Alcohol use: No    Comment: seldom  . Drug use: No  . Sexual activity: Yes    Birth control/protection: None, Other-see comments    Comment: nuva ring  Lifestyle  . Physical activity:    Days per week: Not on file    Minutes per session: Not on file  . Stress: Not on file  Relationships  . Social connections:    Talks on phone: Not on file    Gets together: Not on file    Attends religious service: Not on file    Active member of club or organization: Not on file    Attends meetings of clubs or organizations: Not on file    Relationship status: Not on file  . Intimate partner violence:  Fear of current or ex partner: Not on file    Emotionally abused: Not on file    Physically abused: Not on file    Forced sexual activity: Not on file  Other Topics Concern  . Not on file  Social History Narrative  . Not on file   Family History  Problem Relation Age of Onset  . Heart disease Father   . Colon cancer Neg Hx   . Esophageal cancer Neg Hx     OBJECTIVE:  Vitals:   05/26/18 1928  BP: 104/67  Pulse: 67  Resp: 16  Temp: 98.4 F (36.9 C)  TempSrc: Oral  SpO2: 99%   General appearance: AOx3 in no acute distress; nontoxic appearance HEENT: NCAT.  Oropharynx clear.  Lungs: clear to auscultation bilaterally without adventitious breath sounds Heart: regular rate and rhythm.  Radial pulses 2+ symmetrical bilaterally Abdomen: soft; non-distended; mild  diffuse tenderness about the abdomen; normal active bowel sounds; minimal guarding, no rebound tenderness Back: no CVA tenderness GU: On external examination no obvious lesions, discharge, or masses Bimanual exam performed prior to speculum exam.  Mild cervical motion tenderness and bilateral adnexal tenderness Speculum exam: Thin white discharge appreciated during pelvic exam.  Cervix visualized without obvious erythema.   Extremities: no edema; symmetrical with no gross deformities Skin: warm and dry Neurologic: Ambulates from chair to exam table without difficulty Psychological: alert and cooperative; normal mood and affect  Labs Reviewed  POCT URINALYSIS DIP (DEVICE) - Abnormal; Notable for the following components:      Result Value   Hgb urine dipstick TRACE (*)    Leukocytes, UA LARGE (*)    All other components within normal limits  URINE CULTURE  CERVICOVAGINAL ANCILLARY ONLY   ASSESSMENT & PLAN:  1. Female pelvic inflammatory disease   2. Adnexal tenderness   3. Vaginal discharge     Meds ordered this encounter  Medications  . cefTRIAXone (ROCEPHIN) injection 250 mg  . azithromycin (ZITHROMAX) tablet 1,000 mg  . doxycycline (VIBRAMYCIN) 100 MG capsule    Sig: Take 1 capsule (100 mg total) by mouth 2 (two) times daily for 14 days.    Dispense:  28 capsule    Refill:  0    Order Specific Question:   Supervising Provider    Answer:   Wynona Luna [[id number]]  . ketorolac (TORADOL) injection 60 mg   Rest and push fluids Toradol shot given in office Given rocephin 250mg  injection and azithromycin 1g in office Cervical swab obtained Prescribed doxycycline.  We will treat you today for pelvic inflammatory disease Take medications as prescribed and to completion We will follow up with you regarding the results of your test If tests are positive, please abstain from sexual activity for at least 7 days and notify partners If you have any new or worsening symptoms go  to [facility] hospital for further evaluation and management such as worsening abdominal or pelvic pain, fever, chills, nausea, vomiting, or if you symptoms do not improve with treatment, etc..  Outlined signs and symptoms indicating need for more acute intervention. Patient verbalized understanding. After Visit Summary given.     Lestine Box, PA-C 05/26/18 2042

## 2018-05-28 LAB — CERVICOVAGINAL ANCILLARY ONLY
BACTERIAL VAGINITIS: NEGATIVE
Candida vaginitis: POSITIVE — AB
Chlamydia: NEGATIVE
Neisseria Gonorrhea: NEGATIVE
TRICH (WINDOWPATH): NEGATIVE

## 2018-05-29 LAB — URINE CULTURE: Culture: >=100000 — AB

## 2018-05-31 ENCOUNTER — Telehealth (HOSPITAL_COMMUNITY): Payer: Self-pay

## 2018-05-31 MED ORDER — SULFAMETHOXAZOLE-TRIMETHOPRIM 800-160 MG PO TABS: 1.0000 | ORAL_TABLET | Freq: Two times a day (BID) | ORAL | 0 refills | Status: AC

## 2018-05-31 MED ORDER — FLUCONAZOLE 150 MG PO TABS: 150.0000 mg | ORAL_TABLET | Freq: Every day | ORAL | 0 refills | Status: AC

## 2018-05-31 NOTE — Telephone Encounter (Signed)
Urine culture positive for Enterobacter Asburiae. This was not treated at ucc visit. Rx for Bactrim BID x 5 days sent to pharmacy of choice.    Pt contacted regarding test for candida (yeast) was positive.  Prescription for fluconazole 150mg  po now, repeat dose in 3d if needed, #2 no refills, sent to the pharmacy of record.    Attempted to reach patient. No answer at this time. Number is not a working number.  Will send letter to patient.

## 2018-06-14 ENCOUNTER — Telehealth (HOSPITAL_COMMUNITY): Payer: Self-pay

## 2018-06-14 NOTE — Telephone Encounter (Signed)
Letter that was sent to patient was sent back to the clinic

## 2018-06-18 ENCOUNTER — Telehealth (HOSPITAL_COMMUNITY): Payer: Self-pay

## 2018-06-18 MED ORDER — FLUCONAZOLE 150 MG PO TABS: 150.0000 mg | ORAL_TABLET | Freq: Every day | ORAL | 0 refills | Status: AC

## 2018-06-18 MED ORDER — SULFAMETHOXAZOLE-TRIMETHOPRIM 800-160 MG PO TABS: 1.0000 | ORAL_TABLET | Freq: Two times a day (BID) | ORAL | 0 refills | Status: AC

## 2018-07-15 ENCOUNTER — Emergency Department (HOSPITAL_COMMUNITY): Payer: Self-pay

## 2018-07-15 ENCOUNTER — Other Ambulatory Visit: Payer: Self-pay

## 2018-07-15 ENCOUNTER — Encounter (HOSPITAL_COMMUNITY): Payer: Self-pay

## 2018-07-15 ENCOUNTER — Emergency Department (HOSPITAL_COMMUNITY)
Admission: EM | Admit: 2018-07-15 | Discharge: 2018-07-16 | Disposition: A | Discharge status: Home / Self Care | Payer: Self-pay | Attending: Emergency Medicine | Admitting: Emergency Medicine

## 2018-07-15 DIAGNOSIS — N760 Acute vaginitis: Secondary | ICD-10-CM | POA: Insufficient documentation

## 2018-07-15 DIAGNOSIS — K625 Hemorrhage of anus and rectum: Secondary | ICD-10-CM | POA: Insufficient documentation

## 2018-07-15 DIAGNOSIS — Z87891 Personal history of nicotine dependence: Secondary | ICD-10-CM | POA: Insufficient documentation

## 2018-07-15 DIAGNOSIS — B9689 Other specified bacterial agents as the cause of diseases classified elsewhere: Secondary | ICD-10-CM

## 2018-07-15 DIAGNOSIS — R1084 Generalized abdominal pain: Secondary | ICD-10-CM | POA: Insufficient documentation

## 2018-07-15 DIAGNOSIS — Z202 Contact with and (suspected) exposure to infections with a predominantly sexual mode of transmission: Secondary | ICD-10-CM | POA: Insufficient documentation

## 2018-07-15 DIAGNOSIS — K648 Other hemorrhoids: Secondary | ICD-10-CM

## 2018-07-15 LAB — COMPREHENSIVE METABOLIC PANEL
ALBUMIN: 4 g/dL (ref 3.5–5.0)
ALK PHOS: 48 U/L (ref 38–126)
ALT: 14 U/L (ref 0–44)
AST: 17 U/L (ref 15–41)
Anion gap: 8 (ref 5–15)
BILIRUBIN TOTAL: 0.4 mg/dL (ref 0.3–1.2)
BUN: 17 mg/dL (ref 6–20)
CALCIUM: 9.1 mg/dL (ref 8.9–10.3)
CO2: 25 mmol/L (ref 22–32)
Chloride: 108 mmol/L (ref 98–111)
Creatinine, Ser: 0.72 mg/dL (ref 0.44–1.00)
GFR calc Af Amer: >60 mL/min (ref >60)
GLUCOSE: 95 mg/dL (ref 70–99)
Potassium: 3.6 mmol/L (ref 3.5–5.1)
Sodium: 141 mmol/L (ref 135–145)
TOTAL PROTEIN: 7.1 g/dL (ref 6.5–8.1)

## 2018-07-15 LAB — CBC
HCT: 41.6 % (ref 36.0–46.0)
Hemoglobin: 13.3 g/dL (ref 12.0–15.0)
MCH: 28.2 pg (ref 26.0–34.0)
MCHC: 32 g/dL (ref 30.0–36.0)
MCV: 88.3 fL (ref 80.0–100.0)
PLATELETS: 205 10*3/uL (ref 150–400)
RBC: 4.71 MIL/uL (ref 3.87–5.11)
RDW: 13.1 % (ref 11.5–15.5)
WBC: 6.9 10*3/uL (ref 4.0–10.5)
nRBC: 0 % (ref 0.0–0.2)

## 2018-07-15 LAB — WET PREP, GENITAL
SPERM: NONE SEEN
TRICH WET PREP: NONE SEEN
Yeast Wet Prep HPF POC: NONE SEEN

## 2018-07-15 LAB — POC OCCULT BLOOD, ED: Fecal Occult Bld: POSITIVE — AB

## 2018-07-15 LAB — TYPE AND SCREEN
ABO/RH(D): O POS
Antibody Screen: NEGATIVE

## 2018-07-15 LAB — I-STAT BETA HCG BLOOD, ED (MC, WL, AP ONLY): I-stat hCG, quantitative: <5 m[IU]/mL (ref <5)

## 2018-07-15 MED ORDER — IBUPROFEN 200 MG PO TABS
600.0000 mg | ORAL_TABLET | Freq: Once | ORAL | Status: AC
  Administered 2018-07-16: 600 mg via ORAL
  Filled 2018-07-15: qty 3

## 2018-07-15 MED ORDER — METRONIDAZOLE 500 MG PO TABS: 500.0000 mg | ORAL_TABLET | Freq: Two times a day (BID) | ORAL | 0 refills | Status: DC

## 2018-07-15 MED ORDER — ACETAMINOPHEN 500 MG PO TABS
1000.0000 mg | ORAL_TABLET | Freq: Once | ORAL | Status: AC
  Administered 2018-07-15: 1000 mg via ORAL
  Filled 2018-07-15: qty 2

## 2018-07-15 MED ORDER — IOPAMIDOL (ISOVUE-300) INJECTION 61%
100.0000 mL | Freq: Once | INTRAVENOUS | Status: AC | PRN
  Administered 2018-07-15: 100 mL via INTRAVENOUS

## 2018-07-15 MED ORDER — SODIUM CHLORIDE 0.9 % IJ SOLN
INTRAMUSCULAR | Status: AC
  Filled 2018-07-15: qty 50

## 2018-07-15 MED ORDER — HYDROCORTISONE ACETATE 25 MG RE SUPP: 25.0000 mg | Freq: Two times a day (BID) | RECTAL | 0 refills | Status: DC

## 2018-07-15 MED ORDER — IOPAMIDOL (ISOVUE-300) INJECTION 61%
INTRAVENOUS | Status: AC
  Filled 2018-07-15: qty 100

## 2018-07-15 NOTE — ED Notes (Signed)
Pt reports aching /stabbing 7/10 rectal pain.

## 2018-07-15 NOTE — Discharge Instructions (Addendum)
Evaluated today for rectal bleeding and vaginal discharge.  You did have a small internal hemorrhoid on exam.  This is most likely the rectal bleeding is coming from either CT scan was negative.  Your vaginal exam did show that you have bacterial vaginosis.  I have prescribed you Flagyl.  Please take as prescribed.  Do not drink alcohol taking this medicine. We did not phylactic treat you for gonorrhea chlamydia during your visit.  The results are positive you will be notified in 24 to 48 hours.  If they are positive he will need to seek treatment.  If these results are positive please notify your sexual partners and they may be treated and tested.  Does not resume sexual intercourse until 1 week after both partners have been treated.  Follow Up with OB/GYN for your lower abdominal pain.  Return To the ED with any new or worsening symptoms

## 2018-07-15 NOTE — ED Triage Notes (Signed)
Patient reports that she has had intermittent abdominal cramping and mixed color blood on stool x 1 week. Patient states she has also noted a vaginal odor. patient denies any vaginal discharge or itching.

## 2018-07-15 NOTE — ED Provider Notes (Signed)
[name] Provider Note   CSN: [id number] Arrival date & time: 07/15/18  1709   History   Chief Complaint Chief Complaint  Patient presents with  . Rectal Bleeding  . Abdominal Cramping  . vaginal odor    HPI Stephanie [name] is a 37 y.o. female past medical history significant for recurrent bacterial vaginosis presents for evaluation of vaginal discharge and intermittent rectal bleeding.  Patient states she has a history of bacterial vaginosis and states "this is the same thing."  States she did recently break-up with her boyfriend whom stated that he had been sexually active with multiple partners outside the relationship.  Patient states she would like to be screened for any STDs during her visit.  Patient also states she has had intermittent rectal bleeding for the past week when straining with bowel movements.  States she has had intermittent constipation and when she strains for her bowel movement she has noticed bright red blood in the toilet as well as when she wipes.  Denies fever, chills, nausea, vomiting, shortness of breath, dysuria, diarrhea.  Patient states she has had some changes in her diet which she feels has contributed to her intermittent constipation.  HPI  Past Medical History:  Diagnosis Date  . BV (bacterial vaginosis)   . UTI (lower urinary tract infection)     There are no active problems to display for this patient.   Past Surgical History:  Procedure Laterality Date  . CESAREAN SECTION       OB History    Gravida  3   Para  1   Term  1   Preterm      AB  1   Living  1     SAB      TAB  1   Ectopic      Multiple      Live Births               Home Medications    Prior to Admission medications   Medication Sig Start Date End Date Taking? Authorizing Provider  PARAGARD INTRAUTERINE COPPER IU by Intrauterine route.   Yes [provider]  dicyclomine (BENTYL) 20 MG tablet  Take 1 tablet (20 mg total) by mouth 2 (two) times daily. Patient not taking: Reported on 07/15/2018 02/18/18   Isla Pence, MD  hydrocortisone (ANUSOL-HC) 25 MG suppository Place 1 suppository (25 mg total) rectally 2 (two) times daily. 07/15/18   Jahmire Ruffins A, PA-C  metroNIDAZOLE (FLAGYL) 500 MG tablet Take 1 tablet (500 mg total) by mouth 2 (two) times daily. 07/15/18   Judene Logue A, PA-C    Family History Family History  Problem Relation Age of Onset  . Heart disease Father   . Colon cancer Neg Hx   . Esophageal cancer Neg Hx     Social History Social History   Tobacco Use  . Smoking status: Former Smoker    Packs/day: 0.50    Years: 10.00    Pack years: 5.00    Types: Cigarettes  . Smokeless tobacco: Never Used  Substance Use Topics  . Alcohol use: Yes    Comment: seldom  . Drug use: No     Allergies   Patient has no known allergies.   Review of Systems Review of Systems  Constitutional: Negative.   HENT: Negative.   Respiratory: Negative.   Cardiovascular: Negative.   Gastrointestinal: Positive for abdominal pain and constipation. Negative for abdominal distention, blood in  stool, diarrhea, nausea, rectal pain and vomiting.  Genitourinary: Positive for vaginal discharge. Negative for decreased urine volume, difficulty urinating, dysuria, flank pain, frequency, hematuria, menstrual problem, pelvic pain, urgency, vaginal bleeding and vaginal pain.  Musculoskeletal: Negative.   Skin: Negative.   Neurological: Negative.   All other systems reviewed and are negative.    Physical Exam Updated Vital Signs BP 114/83   Pulse (!) 58   Temp 98.4 F (36.9 C) (Oral)   Resp 16   Ht 5\' 9"  (1.753 m)   Wt 72.6 kg   LMP 07/11/2018   SpO2 100%   BMI 23.63 kg/m   Physical Exam  Constitutional: She appears well-developed and well-nourished.  Non-toxic appearance. She does not have a sickly appearance. She does not appear ill. No distress.  HENT:    Head: Atraumatic.  Mouth/Throat: Oropharynx is clear and moist.  Eyes: Pupils are equal, round, and reactive to light.  Neck: Normal range of motion.  Cardiovascular: Normal rate, regular rhythm, normal heart sounds and intact distal pulses. Exam reveals no gallop and no friction rub.  No murmur heard. Pulmonary/Chest: Effort normal and breath sounds normal. No stridor. No respiratory distress. She has no wheezes. She has no rales. She exhibits no tenderness.  Abdominal: Soft. Bowel sounds are normal. She exhibits no shifting dullness, no distension, no pulsatile liver, no fluid wave, no abdominal bruit, no ascites, no pulsatile midline mass and no mass. There is no hepatosplenomegaly. There is generalized tenderness. There is no rigidity, no rebound, no guarding, no CVA tenderness, no tenderness at McBurney's point and negative Murphy's sign. No hernia.  Genitourinary:  Genitourinary Comments: Normal appearing external female genitalia without rashes or lesions, normal vaginal epithelium. Normal appearing cervix with discharge. No cervical petechiae. Cervical os is closed. There is no bleeding noted at the os. No odor. Bimanual: No CMT, nontender.  No palpable adnexal masses or tenderness. Uterus midline and not fixed. Rectal exam with 1 internal hemorrhoid at the 9 oclock position. No gross hematochezia. Light brown stool in vault. No cystocele or rectocele noted. No pelvic lymphadenopathy noted. Wet prep was obtained.  Cultures for gonorrhea and chlamydia collected. Exam performed with chaperone in room.  Musculoskeletal: Normal range of motion.  Neurological: She is alert.  Skin: Skin is warm and dry. She is not diaphoretic.  Psychiatric: She has a normal mood and affect.  Nursing note and vitals reviewed.    ED Treatments / Results  Labs (all labs ordered are listed, but only abnormal results are displayed) Labs Reviewed  WET PREP, GENITAL - Abnormal; Notable for the following  components:      Result Value   Clue Cells Wet Prep HPF POC PRESENT (*)    WBC, Wet Prep HPF POC FEW (*)    All other components within normal limits  POC OCCULT BLOOD, ED - Abnormal; Notable for the following components:   Fecal Occult Bld POSITIVE (*)    All other components within normal limits  COMPREHENSIVE METABOLIC PANEL  CBC  I-STAT BETA HCG BLOOD, ED ([facility], [facility], [facility] ONLY)  TYPE AND SCREEN  ABO/RH  GC/CHLAMYDIA PROBE AMP ([place]) NOT AT [facility]    EKG None  Radiology Ct Abdomen Pelvis W Contrast  Result Date: 07/15/2018 CLINICAL DATA:  Abd pain, diverticulitis suspected. Intermittent abdominal cramping and bloody stool for 1 week. EXAM: CT ABDOMEN AND PELVIS WITH CONTRAST TECHNIQUE: Multidetector CT imaging of the abdomen and pelvis was performed using the standard protocol following bolus administration of  intravenous contrast. CONTRAST:  178mL ISOVUE-300 IOPAMIDOL (ISOVUE-300) INJECTION 61% COMPARISON:  CT 02/18/2018 FINDINGS: Lower chest: Subsegmental atelectasis in the left lower lobe. Hepatobiliary: No focal liver abnormality is seen. No gallstones, gallbladder wall thickening, or biliary dilatation. Pancreas: No ductal dilatation or inflammation. Spleen: Normal in size without focal abnormality. Adrenals/Urinary Tract: Normal adrenal glands. No hydronephrosis or perinephric edema. Homogeneous renal enhancement with symmetric excretion on delayed phase imaging. Urinary bladder is physiologically distended without wall thickening. Stomach/Bowel: Bowel evaluation is limited in the absence of enteric contrast. No significant diverticular disease. No bowel wall thickening or inflammatory change. Normal appendix, image 34 series 5. Vascular/Lymphatic: Prominent periuterine vascularity and dilatation of the left ovarian vein at 8 mm, similar to prior exam. No enlarged abdominal or pelvic lymph nodes. Reproductive: Prominent periuterine and adnexal vascularity, left greater than right,  similar to prior exam. IUD in the uterus which appears appropriately positioned by CT. Ovaries appear physiologic. No suspicious adnexal mass. Other: Minimal pelvic free fluid which is physiologic. No free air. No intra-abdominal abscess. Tiny fat containing umbilical hernia. Musculoskeletal: Disc space narrowing and endplate spurring at S0-F0. There are no acute or suspicious osseous abnormalities. IMPRESSION: 1. No acute findings in the abdomen/pelvis. 2. 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 Electronically Signed   By: Keith Rake M.D.   On: 07/15/2018 22:19    Procedures Procedures (including critical care time)  Medications Ordered in ED Medications  iopamidol (ISOVUE-300) 61 % injection (has no administration in time range)  sodium chloride 0.9 % injection (has no administration in time range)  acetaminophen (TYLENOL) tablet 1,000 mg (1,000 mg Oral Given 07/15/18 2110)  iopamidol (ISOVUE-300) 61 % injection 100 mL (100 mLs Intravenous Contrast Given 07/15/18 2203)  ibuprofen (ADVIL,MOTRIN) tablet 600 mg (600 mg Oral Given 07/16/18 0011)     Initial Impression / Assessment and Plan / ED Course  I have reviewed the triage vital signs and the nursing notes.  Pertinent labs & imaging results that were available during my care of the patient were reviewed by me and considered in my medical decision making (see chart for details).  37 year old otherwise well-appearing female presents for evaluation of vaginal discharge and intermittent rectal bleeding.  No tender abdomen on exam.  No rebound or guarding.  History of BV would like additional STD testing.  Will obtain labs, urine and Hemoccult and reevaluate.  Hemoccult positive.  Labs without leukocytosis, hCG negative, CMP without abnormality. Patient is nontoxic, nonseptic appearing, in no apparent distress.   Patient's pain and other symptoms adequately managed in emergency department.  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 Fluid bolus given.  Labs, imaging and vitals reviewed.  Patient does not meet the SIRS or Sepsis criteria.  On repeat exam patient does not have a surgical abdomin and there are no peritoneal signs.  No indication of appendicitis, bowel obstruction, bowel perforation, cholecystitis, diverticulitis, PID or ectopic pregnancy.  Does have internal hemorrhoids, this is most likely the cause of her intermittent rectal bleeding. Wet Prep was positive for bacterial vaginosis.  Will prescribe Flagyl.  Discussed with patient not to drink alcohol taking this medicine.  Patient does not want prophylactic treatment for gonorrhea or chlamydia.  Discussed with patient if she is positive she will receive the results in 24 to 48 hours.  Discussed follow-up treatment if she is positive and instructions to inform her partners of her  positive results.  Patient discharged home with symptomatic treatment and given strict instructions for follow-up with their primary care physician.  I have also discussed reasons to return immediately to the ER.  Patient expresses understanding and agrees with plan.    Final Clinical Impressions(s) / ED Diagnoses   Final diagnoses:  Bacterial vaginosis  Internal hemorrhoids    ED Discharge Orders         Ordered    metroNIDAZOLE (FLAGYL) 500 MG tablet  2 times daily     07/15/18 2255    hydrocortisone (ANUSOL-HC) 25 MG suppository  2 times daily     07/15/18 2255           Verma Grothaus A, PA-C 07/16/18 0013    Quintella Reichert, MD 07/16/18 [phone]

## 2018-07-15 NOTE — ED Notes (Signed)
Pt declines HIV blood test at this time and states that she had in done a few weeks ago.

## 2018-07-16 LAB — ABO/RH: ABO/RH(D): O POS

## 2018-07-16 LAB — GC/CHLAMYDIA PROBE AMP (~~LOC~~) NOT AT ARMC
Chlamydia: NEGATIVE
Neisseria Gonorrhea: NEGATIVE

## 2018-07-28 ENCOUNTER — Encounter (HOSPITAL_COMMUNITY): Payer: Self-pay | Admitting: *Deleted

## 2018-07-28 ENCOUNTER — Emergency Department (HOSPITAL_COMMUNITY)
Admission: EM | Admit: 2018-07-28 | Discharge: 2018-07-28 | Disposition: A | Discharge status: Home / Self Care | Payer: Self-pay | Attending: Emergency Medicine | Admitting: Emergency Medicine

## 2018-07-28 DIAGNOSIS — Z87891 Personal history of nicotine dependence: Secondary | ICD-10-CM | POA: Insufficient documentation

## 2018-07-28 DIAGNOSIS — R102 Pelvic and perineal pain: Secondary | ICD-10-CM | POA: Insufficient documentation

## 2018-07-28 LAB — URINALYSIS, ROUTINE W REFLEX MICROSCOPIC
BILIRUBIN URINE: NEGATIVE
Glucose, UA: NEGATIVE mg/dL
KETONES UR: NEGATIVE mg/dL
Nitrite: NEGATIVE
PH: 5 (ref 5.0–8.0)
Protein, ur: 30 mg/dL — AB
SPECIFIC GRAVITY, URINE: 1.024 (ref 1.005–1.030)
Squamous Epithelial / LPF: >50 — ABNORMAL HIGH (ref 0–5)

## 2018-07-28 LAB — BASIC METABOLIC PANEL
Anion gap: 7 (ref 5–15)
BUN: 10 mg/dL (ref 6–20)
CALCIUM: 8.4 mg/dL — AB (ref 8.9–10.3)
CHLORIDE: 110 mmol/L (ref 98–111)
CO2: 23 mmol/L (ref 22–32)
CREATININE: 0.67 mg/dL (ref 0.44–1.00)
GFR calc Af Amer: >60 mL/min (ref >60)
GFR calc non Af Amer: >60 mL/min (ref >60)
GLUCOSE: 92 mg/dL (ref 70–99)
Potassium: 3.9 mmol/L (ref 3.5–5.1)
Sodium: 140 mmol/L (ref 135–145)

## 2018-07-28 LAB — POC URINE PREG, ED: Preg Test, Ur: NEGATIVE

## 2018-07-28 LAB — CBC
HEMATOCRIT: 39.1 % (ref 36.0–46.0)
HEMOGLOBIN: 12.2 g/dL (ref 12.0–15.0)
MCH: 27.2 pg (ref 26.0–34.0)
MCHC: 31.2 g/dL (ref 30.0–36.0)
MCV: 87.3 fL (ref 80.0–100.0)
Platelets: 182 10*3/uL (ref 150–400)
RBC: 4.48 MIL/uL (ref 3.87–5.11)
RDW: 13 % (ref 11.5–15.5)
WBC: 5.6 10*3/uL (ref 4.0–10.5)
nRBC: 0 % (ref 0.0–0.2)

## 2018-07-28 NOTE — ED Notes (Signed)
MD went to update pt and pt was not in the room. RN checked bathroom and around the pod for pt. Pt was not found. MD made aware.

## 2018-07-28 NOTE — ED Provider Notes (Signed)
[facility] EMERGENCY DEPARTMENT Provider Note   CSN: [id number] Arrival date & time: 07/28/18  0945     History   Chief Complaint Chief Complaint  Patient presents with  . Pelvic pain    HPI Stephanie [name] is a 37 y.o. female.  HPI Patient presented to the emergency room for evaluation of pelvic pain.  Patient states she was seen in the emergency room on October 18.  She had a complete evaluation that included abdominal pelvic CT scan.  Patient was told that CT scan suggested she could have pelvic ingestion and she should follow-up with an OB/GYN doctor.  Patient has not had any trouble with any vaginal discharge.  She has not had any vomiting or diarrhea.  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  She is also had some heart difficulties so she has been using the bus and walking more.  Patient has noticed some intermittent sharp twinges in her lower abdomen.  She was not sure what she needed to do with the pelvic congestion syndrome and was concerned because she was still having pain so she came to the ED.  Her symptoms are mild right now. Past Medical History:  Diagnosis Date  . BV (bacterial vaginosis)   . UTI (lower urinary tract infection)     There are no active problems to display for this patient.   Past Surgical History:  Procedure Laterality Date  . CESAREAN SECTION       OB History    Gravida  3   Para  1   Term  1   Preterm      AB  1   Living  1     SAB      TAB  1   Ectopic      Multiple      Live Births               Home Medications    Prior to Admission medications   Medication Sig Start Date End Date Taking? Authorizing Provider  PARAGARD INTRAUTERINE COPPER IU 1 Device by Intrauterine route once.    Yes [provider]  dicyclomine (BENTYL) 20 MG tablet Take 1 tablet (20 mg total) by mouth 2 (two) times daily. Patient not taking: Reported on 07/15/2018 02/18/18   Isla Pence, MD  hydrocortisone (ANUSOL-HC) 25 MG suppository Place 1 suppository (25 mg total) rectally 2 (two) times daily. Patient not taking: Reported on 07/28/2018 07/15/18   Henderly, Britni A, PA-C    Family History Family History  Problem Relation Age of Onset  . Heart disease Father   . Colon cancer Neg Hx   . Esophageal cancer Neg Hx     Social History Social History   Tobacco Use  . Smoking status: Former Smoker    Packs/day: 0.50    Years: 10.00    Pack years: 5.00    Types: Cigarettes  . Smokeless tobacco: Never Used  Substance Use Topics  . Alcohol use: Yes    Comment: seldom  . Drug use: No     Allergies   Patient has no known allergies.   Review of Systems Review of Systems  All other systems reviewed and are negative.    Physical Exam Updated Vital Signs LMP 07/11/2018   Physical Exam  Constitutional: She appears well-developed and well-nourished. No distress.  HENT:  Head: Normocephalic and atraumatic.  Right Ear: External ear normal.  Left Ear: External ear normal.  Eyes: Conjunctivae are normal. Right eye exhibits no discharge. Left eye exhibits no discharge. No scleral icterus.  Neck: Neck supple. No tracheal deviation present.  Cardiovascular: Normal rate, regular rhythm and intact distal pulses.  Pulmonary/Chest: Effort normal and breath sounds normal. No stridor. No respiratory distress. She has no wheezes. She has no rales.  Abdominal: Soft. Bowel sounds are normal. She exhibits no distension. There is no tenderness. There is no rebound and no guarding.  Musculoskeletal: She exhibits no edema or tenderness.  Neurological: She is alert. She has normal strength. No cranial nerve deficit (no facial droop, extraocular movements intact, no slurred speech) or sensory deficit. She exhibits normal muscle tone. She displays no seizure activity. Coordination normal.  Skin: Skin is warm and dry. No rash noted.  Psychiatric: She has a normal mood and  affect.  Nursing note and vitals reviewed.    ED Treatments / Results  Labs (all labs ordered are listed, but only abnormal results are displayed) Labs Reviewed  URINALYSIS, ROUTINE W REFLEX MICROSCOPIC - Abnormal; Notable for the following components:      Result Value   Color, Urine AMBER (*)    APPearance TURBID (*)    Hgb urine dipstick MODERATE (*)    Protein, ur 30 (*)    Leukocytes, UA LARGE (*)    WBC, UA >50 (*)    Bacteria, UA FEW (*)    Squamous Epithelial / LPF >50 (*)    All other components within normal limits  BASIC METABOLIC PANEL - Abnormal; Notable for the following components:   Calcium 8.4 (*)    All other components within normal limits  CBC  POC URINE PREG, ED     Procedures Procedures (including critical care time)  Medications Ordered in ED Medications - No data to display   Initial Impression / Assessment and Plan / ED Course  I have reviewed the triage vital signs and the nursing notes.  Pertinent labs & imaging results that were available during my care of the patient were reviewed by me and considered in my medical decision making (see chart for details).   Patient presented to the emergency room for evaluation of pelvic pain.  Patient was recently in the hospital.  She had a CT scan.  The findings suggest the possibility of pelvic congestion.  Patient was concerned because she has not seen an OB/GYN and she still having some intermittent discomfort.  Patient's laboratory tests were unremarkable with the exception of a urinalysis that showed pyuria with significant squamous epithelial cell contamination.  I doubt UTI.  Patient ended up leaving before is able to go over her discharge instructions.  Do not think she needs to call back in and she can follow-up with her OB/GYN doctor  Final Clinical Impressions(s) / ED Diagnoses   Final diagnoses:  Pelvic pain    ED Discharge Orders    None       Dorie Rank, MD 07/28/18 [phone]

## 2018-07-28 NOTE — ED Notes (Signed)
Pt not in assigned room, not able to find after 15 minutes of searching.  Assumed left AMA and will DC from system.

## 2018-07-28 NOTE — ED Triage Notes (Signed)
Pt in c/o pelvic pain, states she was seen a few weeks ago and told she had pelvic congestion, has not been able to follow up with her OBGYN, also c/o headache, denies dysuria or vaginal discharge

## 2018-08-31 ENCOUNTER — Encounter (HOSPITAL_COMMUNITY): Payer: Self-pay

## 2018-08-31 ENCOUNTER — Other Ambulatory Visit: Payer: Self-pay

## 2018-08-31 ENCOUNTER — Emergency Department (HOSPITAL_COMMUNITY)
Admission: EM | Admit: 2018-08-31 | Discharge: 2018-08-31 | Disposition: A | Discharge status: Home / Self Care | Payer: Self-pay | Attending: Emergency Medicine | Admitting: Emergency Medicine

## 2018-08-31 DIAGNOSIS — Z5321 Procedure and treatment not carried out due to patient leaving prior to being seen by health care provider: Secondary | ICD-10-CM | POA: Insufficient documentation

## 2018-08-31 DIAGNOSIS — R300 Dysuria: Secondary | ICD-10-CM | POA: Insufficient documentation

## 2018-08-31 LAB — URINALYSIS, ROUTINE W REFLEX MICROSCOPIC
Bilirubin Urine: NEGATIVE
Glucose, UA: NEGATIVE mg/dL
Ketones, ur: NEGATIVE mg/dL
Nitrite: POSITIVE — AB
Protein, ur: NEGATIVE mg/dL
Specific Gravity, Urine: 1.02 (ref 1.005–1.030)
WBC, UA: >50 WBC/hpf — ABNORMAL HIGH (ref 0–5)
pH: 5 (ref 5.0–8.0)

## 2018-08-31 LAB — COMPREHENSIVE METABOLIC PANEL
ALT: 14 U/L (ref 0–44)
AST: 19 U/L (ref 15–41)
Albumin: 4 g/dL (ref 3.5–5.0)
Alkaline Phosphatase: 49 U/L (ref 38–126)
Anion gap: 8 (ref 5–15)
BUN: 12 mg/dL (ref 6–20)
CO2: 24 mmol/L (ref 22–32)
Calcium: 8.6 mg/dL — ABNORMAL LOW (ref 8.9–10.3)
Chloride: 109 mmol/L (ref 98–111)
Creatinine, Ser: 0.72 mg/dL (ref 0.44–1.00)
GFR calc Af Amer: >60 mL/min (ref >60)
GFR calc non Af Amer: >60 mL/min (ref >60)
Glucose, Bld: 99 mg/dL (ref 70–99)
Potassium: 3.9 mmol/L (ref 3.5–5.1)
Sodium: 141 mmol/L (ref 135–145)
Total Bilirubin: 0.4 mg/dL (ref 0.3–1.2)
Total Protein: 6.6 g/dL (ref 6.5–8.1)

## 2018-08-31 LAB — I-STAT BETA HCG BLOOD, ED (MC, WL, AP ONLY): I-stat hCG, quantitative: <5 m[IU]/mL (ref <5)

## 2018-08-31 LAB — CBC
HEMATOCRIT: 39.2 % (ref 36.0–46.0)
Hemoglobin: 12.4 g/dL (ref 12.0–15.0)
MCH: 28.4 pg (ref 26.0–34.0)
MCHC: 31.6 g/dL (ref 30.0–36.0)
MCV: 89.7 fL (ref 80.0–100.0)
Platelets: 186 10*3/uL (ref 150–400)
RBC: 4.37 MIL/uL (ref 3.87–5.11)
RDW: 13.4 % (ref 11.5–15.5)
WBC: 6.5 10*3/uL (ref 4.0–10.5)
nRBC: 0 % (ref 0.0–0.2)

## 2018-08-31 LAB — LIPASE, BLOOD: Lipase: 35 U/L (ref 11–51)

## 2018-08-31 NOTE — ED Triage Notes (Signed)
Patient c/o cloudy urine, dysuria, and frequency x 1 week. Patient c/o lower abdominal pain, N/V this AM.

## 2018-08-31 NOTE — ED Notes (Signed)
Pt not in lobby.  

## 2018-09-01 ENCOUNTER — Other Ambulatory Visit: Payer: Self-pay

## 2018-09-01 ENCOUNTER — Encounter (HOSPITAL_COMMUNITY): Payer: Self-pay | Admitting: Emergency Medicine

## 2018-09-01 ENCOUNTER — Ambulatory Visit (HOSPITAL_COMMUNITY)
Admission: EM | Admit: 2018-09-01 | Discharge: 2018-09-01 | Disposition: A | Discharge status: Home / Self Care | Payer: Self-pay | Attending: Family Medicine | Admitting: Family Medicine

## 2018-09-01 DIAGNOSIS — Z87891 Personal history of nicotine dependence: Secondary | ICD-10-CM | POA: Insufficient documentation

## 2018-09-01 DIAGNOSIS — Z881 Allergy status to other antibiotic agents status: Secondary | ICD-10-CM | POA: Insufficient documentation

## 2018-09-01 DIAGNOSIS — N390 Urinary tract infection, site not specified: Secondary | ICD-10-CM | POA: Insufficient documentation

## 2018-09-01 DIAGNOSIS — Z87440 Personal history of urinary (tract) infections: Secondary | ICD-10-CM | POA: Insufficient documentation

## 2018-09-01 LAB — POCT URINALYSIS DIP (DEVICE)
Glucose, UA: 100 mg/dL — AB
Hgb urine dipstick: NEGATIVE
Nitrite: POSITIVE — AB
Protein, ur: 30 mg/dL — AB
Specific Gravity, Urine: 1.02 (ref 1.005–1.030)
Urobilinogen, UA: 2 mg/dL — ABNORMAL HIGH (ref 0.0–1.0)
pH: 5 (ref 5.0–8.0)

## 2018-09-01 LAB — POCT PREGNANCY, URINE: Preg Test, Ur: NEGATIVE

## 2018-09-01 MED ORDER — CIPROFLOXACIN HCL 500 MG PO TABS: 500.0000 mg | ORAL_TABLET | Freq: Two times a day (BID) | ORAL | 0 refills | Status: DC

## 2018-09-01 NOTE — ED Provider Notes (Signed)
[facility]    CSN: [id number] Arrival date & time: 09/01/18  1220     History   Chief Complaint Chief Complaint  Patient presents with  . Urinary Frequency  . Dysuria  . Nausea    HPI SHAMEKA AGGARWAL is a 37 y.o. female.   Pt reports recurrent UTI's.  She just finished antibiotics about a month ago.  She complains of urinary frequency, odor, pain and abdominal discomfort with nausea.  Patient was seen in the emergency room yesterday but after waiting 3 hours for a variety of laboratory tests results, she left without being seen.  It is unclear why she had so many laboratory tests including a blood pregnancy test since all she had was dysuria.  Patient has had urinary symptoms now for 2 days.  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     Past Medical History:  Diagnosis Date  . BV (bacterial vaginosis)   . UTI (lower urinary tract infection)     There are no active problems to display for this patient.   Past Surgical History:  Procedure Laterality Date  . CESAREAN SECTION      OB History    Gravida  3   Para  1   Term  1   Preterm      AB  1   Living  1     SAB      TAB  1   Ectopic      Multiple      Live Births               Home Medications    Prior to Admission medications   Medication Sig Start Date End Date Taking? Authorizing Provider  PARAGARD INTRAUTERINE COPPER IU 1 Device by Intrauterine route once.    Yes [provider]  ciprofloxacin (CIPRO) 500 MG tablet Take 1 tablet (500 mg total) by mouth 2 (two) times daily. 09/01/18   Robyn Haber, MD    Family History Family History  Problem Relation Age of Onset  . Heart disease Father   . Colon cancer Neg Hx   . Esophageal cancer Neg Hx     Social History Social History   Tobacco Use  . Smoking status: Former Smoker    Packs/day: 0.50    Years: 10.00    Pack years: 5.00    Types: Cigarettes  .  Smokeless tobacco: Never Used  Substance Use Topics  . Alcohol use: Yes    Comment: seldom  . Drug use: No     Allergies   Erythromycin   Review of Systems Review of Systems   Physical Exam Triage Vital Signs ED Triage Vitals  Enc Vitals Group     BP 09/01/18 1356 112/70     Pulse Rate 09/01/18 1356 65     Resp --      Temp 09/01/18 1356 97.9 F (36.6 C)     Temp Source 09/01/18 1356 Oral     SpO2 09/01/18 1356 96 %     Weight --      Height --      Head Circumference --      Peak Flow --      Pain Score 09/01/18 1353 7     Pain Loc --      Pain Edu? --      Excl. in [place]? --    No data found.  Updated Vital Signs BP  112/70 (BP Location: Left Arm)   Pulse 65   Temp 97.9 F (36.6 C) (Oral)   LMP 08/31/2018 (Exact Date)   SpO2 96%    Physical Exam  Constitutional: She is oriented to person, place, and time. She appears well-developed and well-nourished.  HENT:  Right Ear: External ear normal.  Left Ear: External ear normal.  Mouth/Throat: Oropharynx is clear and moist.  Eyes: Conjunctivae are normal.  Neck: Normal range of motion. Neck supple.  Pulmonary/Chest: Effort normal.  Musculoskeletal: Normal range of motion.  Neurological: She is alert and oriented to person, place, and time.  Skin: Skin is warm and dry.  Nursing note and vitals reviewed.    UC Treatments / Results  Labs (all labs ordered are listed, but only abnormal results are displayed) Labs Reviewed  POCT URINALYSIS DIP (DEVICE) - Abnormal; Notable for the following components:      Result Value   Glucose, UA 100 (*)    Bilirubin Urine SMALL (*)    Ketones, ur TRACE (*)    Protein, ur 30 (*)    Urobilinogen, UA 2.0 (*)    Nitrite POSITIVE (*)    Leukocytes, UA SMALL (*)    All other components within normal limits  URINE CULTURE  POCT PREGNANCY, URINE    EKG None  Radiology No results found.  Procedures Procedures (including critical care time)  Medications Ordered in  UC Medications - No data to display  Initial Impression / Assessment and Plan / UC Course  I have reviewed the triage vital signs and the nursing notes.  Pertinent labs & imaging results that were available during my care of the patient were reviewed by me and considered in my medical decision making (see chart for details).    Final Clinical Impressions(s) / UC Diagnoses   Final diagnoses:  Lower urinary tract infectious disease   Discharge Instructions   None    ED Prescriptions    Medication Sig Dispense Auth. Provider   ciprofloxacin (CIPRO) 500 MG tablet Take 1 tablet (500 mg total) by mouth 2 (two) times daily. 10 tablet Robyn Haber, MD     Controlled Substance Prescriptions [place] Controlled Substance Registry consulted? Not Applicable   Robyn Haber, MD 09/01/18 1444

## 2018-09-01 NOTE — ED Triage Notes (Signed)
Pt reports recurrent UTI's.  She just finished antibiotics about a month ago.  She complains of urinary frequency, odor, pain and abdominal discomfort with nausea.

## 2018-09-02 LAB — URINE CULTURE: Culture: >=100000 — AB

## 2018-09-05 ENCOUNTER — Ambulatory Visit (HOSPITAL_COMMUNITY)
Admission: EM | Admit: 2018-09-05 | Discharge: 2018-09-05 | Disposition: A | Discharge status: Home / Self Care | Payer: Self-pay | Attending: Family Medicine | Admitting: Family Medicine

## 2018-09-05 ENCOUNTER — Encounter (HOSPITAL_COMMUNITY): Payer: Self-pay

## 2018-09-05 ENCOUNTER — Other Ambulatory Visit: Payer: Self-pay

## 2018-09-05 DIAGNOSIS — R350 Frequency of micturition: Secondary | ICD-10-CM | POA: Insufficient documentation

## 2018-09-05 DIAGNOSIS — N898 Other specified noninflammatory disorders of vagina: Secondary | ICD-10-CM | POA: Insufficient documentation

## 2018-09-05 DIAGNOSIS — K921 Melena: Secondary | ICD-10-CM | POA: Insufficient documentation

## 2018-09-05 LAB — POCT URINALYSIS DIP (DEVICE)
Bilirubin Urine: NEGATIVE
GLUCOSE, UA: NEGATIVE mg/dL
Hgb urine dipstick: NEGATIVE
Ketones, ur: NEGATIVE mg/dL
LEUKOCYTES UA: NEGATIVE
Nitrite: NEGATIVE
Protein, ur: NEGATIVE mg/dL
Specific Gravity, Urine: >=1.03 (ref 1.005–1.030)
Urobilinogen, UA: 0.2 mg/dL (ref 0.0–1.0)
pH: 6.5 (ref 5.0–8.0)

## 2018-09-05 NOTE — ED Triage Notes (Signed)
Pt presents today with follow up for UTI she was recently seen for. States she feels like the sxs have not fully gone. Still has urinary frequency but no more urine odor. Thinks she may have BV. Also would like something for hemorrhoids due to seeing blood every time she has a BM the last few days.

## 2018-09-05 NOTE — Discharge Instructions (Signed)
°  No Primary Care Doctor: Call [facility] at  [phone] - they can help you locate a primary care doctor that  accepts your insurance, provides certain services, etc. Physician Referral [phone]  Please follow up with family medicine, OB/GYN and GI for further evaluation and treatment of recurrent abdominal pain, blood in stool, and recurrent UTIs.

## 2018-09-05 NOTE — ED Provider Notes (Signed)
[facility]    CSN: [id number] Arrival date & time: 09/05/18  1400     History   Chief Complaint Chief Complaint  Patient presents with  . Follow up- UTI    HPI Stephanie [name] is a 37 y.o. female.   HPI Stephanie [name] is a 37 y.o. female presenting to UC with c/o continued urinary frequency with malodorous vaginal discharge despite completing a treatment of Cipro for a UTI dx on 09/01/18 from this UC.  Discharge is similar to prior episodes of BV.  Denies concern for STDs.  She is also concerned about small amounts of blood in her stool after having a BM. She asked for a treatment for hemorrhoids last visit but her insurance did not cover the suppositories prescribed. She has had intermittent episodes of abdominal pain and hematochezia for over 1 year. She had a CT abd in October of this year, which did not find a source of her symptoms but pelvic congestion syndrome was noted.  She has not f/u with PCP, OB/GYN or GI.    Past Medical History:  Diagnosis Date  . BV (bacterial vaginosis)   . UTI (lower urinary tract infection)     There are no active problems to display for this patient.   Past Surgical History:  Procedure Laterality Date  . CESAREAN SECTION      OB History    Gravida  3   Para  1   Term  1   Preterm      AB  1   Living  1     SAB      TAB  1   Ectopic      Multiple      Live Births               Home Medications    Prior to Admission medications   Medication Sig Start Date End Date Taking? Authorizing Provider  ciprofloxacin (CIPRO) 500 MG tablet Take 1 tablet (500 mg total) by mouth 2 (two) times daily. 09/01/18  Yes Robyn Haber, MD  PARAGARD INTRAUTERINE COPPER IU 1 Device by Intrauterine route once.    Yes [provider]    Family History Family History  Problem Relation Age of Onset  . Heart disease Father   . Colon cancer Neg Hx   . Esophageal cancer Neg Hx     Social  History Social History   Tobacco Use  . Smoking status: Former Smoker    Packs/day: 0.50    Years: 10.00    Pack years: 5.00    Types: Cigarettes  . Smokeless tobacco: Never Used  Substance Use Topics  . Alcohol use: Yes    Comment: seldom  . Drug use: No     Allergies   Erythromycin   Review of Systems Review of Systems  Gastrointestinal: Positive for blood in stool. Negative for nausea and vomiting.  Genitourinary: Positive for frequency and vaginal discharge. Negative for dysuria, urgency, vaginal bleeding and vaginal pain.     Physical Exam Triage Vital Signs ED Triage Vitals  Enc Vitals Group     BP 09/05/18 1447 115/78     Pulse Rate 09/05/18 1447 76     Resp 09/05/18 1447 16     Temp 09/05/18 1447 97.8 F (36.6 C)     Temp Source 09/05/18 1447 Oral     SpO2 09/05/18 1447 99 %     Weight --      Height --  Head Circumference --      Peak Flow --      Pain Score 09/05/18 1449 7     Pain Loc --      Pain Edu? --      Excl. in [place]? --    No data found.  Updated Vital Signs BP 115/78 (BP Location: Right Arm)   Pulse 76   Temp 97.8 F (36.6 C) (Oral)   Resp 16   LMP 08/31/2018 (Exact Date)   SpO2 99%   Visual Acuity Right Eye Distance:   Left Eye Distance:   Bilateral Distance:    Right Eye Near:   Left Eye Near:    Bilateral Near:     Physical Exam  Constitutional: She is oriented to person, place, and time. She appears well-developed and well-nourished.  HENT:  Head: Normocephalic and atraumatic.  Eyes: EOM are normal.  Neck: Normal range of motion.  Cardiovascular: Normal rate and regular rhythm.  Pulmonary/Chest: Effort normal. No respiratory distress.  Abdominal: Soft. She exhibits no distension. There is no tenderness.  Genitourinary: Rectal exam shows no external hemorrhoid, no internal hemorrhoid, no fissure and no tenderness.  Musculoskeletal: Normal range of motion.  Neurological: She is alert and oriented to person, place,  and time.  Skin: Skin is warm and dry.  Psychiatric: She has a normal mood and affect. Her behavior is normal.  Nursing note and vitals reviewed.    UC Treatments / Results  Labs (all labs ordered are listed, but only abnormal results are displayed) Labs Reviewed  POCT URINALYSIS DIP (DEVICE)  CERVICOVAGINAL ANCILLARY ONLY    EKG None  Radiology No results found.  Procedures Procedures (including critical care time)  Medications Ordered in UC Medications - No data to display  Initial Impression / Assessment and Plan / UC Course  I have reviewed the triage vital signs and the nursing notes.  Pertinent labs & imaging results that were available during my care of the patient were reviewed by me and considered in my medical decision making (see chart for details).     UA: WNL Wet prep- self swab performed, sent to lab GI symptoms chronic in nature, benign abdominal exam, normal rectal exam. Reviewed medical records including latest CT abd.  Encouraged f/u as noted below.  Final Clinical Impressions(s) / UC Diagnoses   Final diagnoses:  Urinary frequency  Blood in stool  Vaginal discharge     Discharge Instructions      No Primary Care Doctor: - Call [facility] at  [phone] - they can help you locate a primary care doctor that  accepts your insurance, provides certain services, etc. - Physician Referral [phone]  Please follow up with family medicine, OB/GYN and GI for further evaluation and treatment of recurrent abdominal pain, blood in stool, and recurrent UTIs.     ED Prescriptions    None     Controlled Substance Prescriptions [place] Controlled Substance Registry consulted? Not Applicable   Tyrell Antonio 09/05/18 1622

## 2018-09-06 ENCOUNTER — Telehealth (HOSPITAL_COMMUNITY): Payer: Self-pay | Admitting: Emergency Medicine

## 2018-09-06 LAB — CERVICOVAGINAL ANCILLARY ONLY
Bacterial vaginitis: POSITIVE — AB
Candida vaginitis: POSITIVE — AB
Trichomonas: NEGATIVE

## 2018-09-06 MED ORDER — METRONIDAZOLE 500 MG PO TABS: 500.0000 mg | ORAL_TABLET | Freq: Two times a day (BID) | ORAL | 0 refills | Status: DC

## 2018-09-06 MED ORDER — DIFLUCAN 150 MG PO TABS: 150.0000 mg | ORAL_TABLET | Freq: Once | ORAL | 0 refills | Status: AC

## 2018-09-06 NOTE — Telephone Encounter (Signed)
Bacterial vaginosis is positive. This was not treated at the urgent care visit.  Flagyl 500 mg BID x 7 days #14 no refills sent to patients pharmacy of choice.    Test for candida (yeast) was positive.  Prescription for fluconazole 150mg  po now, repeat dose in 3d if needed, #2 no refills, sent to the pharmacy of record.  Recheck or followup with PCP for further evaluation if symptoms are not improving.    Pt contacted and made aware. All questions answered.

## 2018-12-16 ENCOUNTER — Encounter: Payer: Self-pay | Admitting: *Deleted

## 2019-01-03 ENCOUNTER — Ambulatory Visit: Payer: Self-pay | Admitting: Obstetrics & Gynecology

## 2019-01-03 DIAGNOSIS — R102 Pelvic and perineal pain: Secondary | ICD-10-CM

## 2019-01-03 NOTE — Progress Notes (Signed)
Stephanie [name] spoke with the patient and she stated that she does not want a phone visit. She says that she will call and schedule an in person appt at some time in the future.

## 2019-09-30 DIAGNOSIS — K56609 Unspecified intestinal obstruction, unspecified as to partial versus complete obstruction: Secondary | ICD-10-CM

## 2019-09-30 HISTORY — DX: Unspecified intestinal obstruction, unspecified as to partial versus complete obstruction: K56.609

## 2020-03-11 ENCOUNTER — Encounter (HOSPITAL_COMMUNITY): Payer: Self-pay

## 2020-03-11 ENCOUNTER — Other Ambulatory Visit: Payer: Self-pay

## 2020-03-11 ENCOUNTER — Inpatient Hospital Stay (HOSPITAL_COMMUNITY)
Admission: EM | Admit: 2020-03-11 | Discharge: 2020-03-16 | DRG: 391 | Disposition: A | Discharge status: Home / Self Care | Payer: Medicaid Other | Attending: Internal Medicine | Admitting: Internal Medicine

## 2020-03-11 DIAGNOSIS — E739 Lactose intolerance, unspecified: Secondary | ICD-10-CM | POA: Diagnosis present

## 2020-03-11 DIAGNOSIS — Z882 Allergy status to sulfonamides status: Secondary | ICD-10-CM

## 2020-03-11 DIAGNOSIS — D1803 Hemangioma of intra-abdominal structures: Secondary | ICD-10-CM | POA: Diagnosis present

## 2020-03-11 DIAGNOSIS — Z79899 Other long term (current) drug therapy: Secondary | ICD-10-CM

## 2020-03-11 DIAGNOSIS — Z87440 Personal history of urinary (tract) infections: Secondary | ICD-10-CM

## 2020-03-11 DIAGNOSIS — Z20822 Contact with and (suspected) exposure to covid-19: Secondary | ICD-10-CM | POA: Diagnosis present

## 2020-03-11 DIAGNOSIS — R519 Headache, unspecified: Secondary | ICD-10-CM | POA: Diagnosis present

## 2020-03-11 DIAGNOSIS — D509 Iron deficiency anemia, unspecified: Secondary | ICD-10-CM | POA: Diagnosis present

## 2020-03-11 DIAGNOSIS — K5720 Diverticulitis of large intestine with perforation and abscess without bleeding: Principal | ICD-10-CM | POA: Diagnosis present

## 2020-03-11 DIAGNOSIS — K651 Peritoneal abscess: Secondary | ICD-10-CM | POA: Diagnosis present

## 2020-03-11 DIAGNOSIS — E876 Hypokalemia: Secondary | ICD-10-CM | POA: Diagnosis not present

## 2020-03-11 DIAGNOSIS — K659 Peritonitis, unspecified: Secondary | ICD-10-CM | POA: Diagnosis present

## 2020-03-11 DIAGNOSIS — Z8249 Family history of ischemic heart disease and other diseases of the circulatory system: Secondary | ICD-10-CM

## 2020-03-11 DIAGNOSIS — K6389 Other specified diseases of intestine: Secondary | ICD-10-CM

## 2020-03-11 DIAGNOSIS — K5900 Constipation, unspecified: Secondary | ICD-10-CM | POA: Diagnosis present

## 2020-03-11 DIAGNOSIS — Z87891 Personal history of nicotine dependence: Secondary | ICD-10-CM

## 2020-03-11 DIAGNOSIS — C19 Malignant neoplasm of rectosigmoid junction: Secondary | ICD-10-CM | POA: Diagnosis present

## 2020-03-11 DIAGNOSIS — R160 Hepatomegaly, not elsewhere classified: Secondary | ICD-10-CM

## 2020-03-11 DIAGNOSIS — Z975 Presence of (intrauterine) contraceptive device: Secondary | ICD-10-CM

## 2020-03-11 LAB — I-STAT BETA HCG BLOOD, ED (MC, WL, AP ONLY): I-stat hCG, quantitative: <5 m[IU]/mL (ref <5)

## 2020-03-11 LAB — COMPREHENSIVE METABOLIC PANEL
ALT: 9 U/L (ref 0–44)
AST: 13 U/L — ABNORMAL LOW (ref 15–41)
Albumin: 3.4 g/dL — ABNORMAL LOW (ref 3.5–5.0)
Alkaline Phosphatase: 48 U/L (ref 38–126)
Anion gap: 12 (ref 5–15)
BUN: 10 mg/dL (ref 6–20)
CO2: 25 mmol/L (ref 22–32)
Calcium: 8.6 mg/dL — ABNORMAL LOW (ref 8.9–10.3)
Chloride: 102 mmol/L (ref 98–111)
Creatinine, Ser: 0.72 mg/dL (ref 0.44–1.00)
GFR calc Af Amer: >60 mL/min (ref >60)
GFR calc non Af Amer: >60 mL/min (ref >60)
Glucose, Bld: 92 mg/dL (ref 70–99)
Potassium: 4 mmol/L (ref 3.5–5.1)
Sodium: 139 mmol/L (ref 135–145)
Total Bilirubin: 0.6 mg/dL (ref 0.3–1.2)
Total Protein: 7 g/dL (ref 6.5–8.1)

## 2020-03-11 LAB — CBC
HCT: 30.8 % — ABNORMAL LOW (ref 36.0–46.0)
Hemoglobin: 9.3 g/dL — ABNORMAL LOW (ref 12.0–15.0)
MCH: 23.7 pg — ABNORMAL LOW (ref 26.0–34.0)
MCHC: 30.2 g/dL (ref 30.0–36.0)
MCV: 78.6 fL — ABNORMAL LOW (ref 80.0–100.0)
Platelets: 223 10*3/uL (ref 150–400)
RBC: 3.92 MIL/uL (ref 3.87–5.11)
RDW: 16.5 % — ABNORMAL HIGH (ref 11.5–15.5)
WBC: 10.5 10*3/uL (ref 4.0–10.5)
nRBC: 0 % (ref 0.0–0.2)

## 2020-03-11 LAB — LIPASE, BLOOD: Lipase: 24 U/L (ref 11–51)

## 2020-03-11 MED ORDER — SODIUM CHLORIDE 0.9% FLUSH
3.0000 mL | Freq: Once | INTRAVENOUS | Status: AC
  Administered 2020-03-12: 3 mL via INTRAVENOUS

## 2020-03-11 NOTE — ED Triage Notes (Signed)
Over the last 3 days her abdomen has been intermittently cramping. Patient reports last dose of ibuprofen today at 4pm with no relief. States some difficulty having a bowel movement and pressure when urinating.

## 2020-03-12 ENCOUNTER — Encounter (HOSPITAL_COMMUNITY): Payer: Self-pay

## 2020-03-12 ENCOUNTER — Emergency Department (HOSPITAL_COMMUNITY): Payer: Medicaid Other

## 2020-03-12 DIAGNOSIS — Z87440 Personal history of urinary (tract) infections: Secondary | ICD-10-CM | POA: Diagnosis not present

## 2020-03-12 DIAGNOSIS — Z8249 Family history of ischemic heart disease and other diseases of the circulatory system: Secondary | ICD-10-CM | POA: Diagnosis not present

## 2020-03-12 DIAGNOSIS — R519 Headache, unspecified: Secondary | ICD-10-CM | POA: Diagnosis present

## 2020-03-12 DIAGNOSIS — Z79899 Other long term (current) drug therapy: Secondary | ICD-10-CM | POA: Diagnosis not present

## 2020-03-12 DIAGNOSIS — Z87891 Personal history of nicotine dependence: Secondary | ICD-10-CM | POA: Diagnosis not present

## 2020-03-12 DIAGNOSIS — K651 Peritoneal abscess: Secondary | ICD-10-CM | POA: Diagnosis present

## 2020-03-12 DIAGNOSIS — D509 Iron deficiency anemia, unspecified: Secondary | ICD-10-CM | POA: Diagnosis present

## 2020-03-12 DIAGNOSIS — Z975 Presence of (intrauterine) contraceptive device: Secondary | ICD-10-CM | POA: Diagnosis not present

## 2020-03-12 DIAGNOSIS — E876 Hypokalemia: Secondary | ICD-10-CM | POA: Diagnosis not present

## 2020-03-12 DIAGNOSIS — C19 Malignant neoplasm of rectosigmoid junction: Secondary | ICD-10-CM | POA: Diagnosis present

## 2020-03-12 DIAGNOSIS — Z882 Allergy status to sulfonamides status: Secondary | ICD-10-CM | POA: Diagnosis not present

## 2020-03-12 DIAGNOSIS — K5720 Diverticulitis of large intestine with perforation and abscess without bleeding: Secondary | ICD-10-CM | POA: Diagnosis not present

## 2020-03-12 DIAGNOSIS — K659 Peritonitis, unspecified: Secondary | ICD-10-CM | POA: Diagnosis not present

## 2020-03-12 DIAGNOSIS — Z20822 Contact with and (suspected) exposure to covid-19: Secondary | ICD-10-CM | POA: Diagnosis present

## 2020-03-12 DIAGNOSIS — D1803 Hemangioma of intra-abdominal structures: Secondary | ICD-10-CM | POA: Diagnosis present

## 2020-03-12 DIAGNOSIS — E739 Lactose intolerance, unspecified: Secondary | ICD-10-CM | POA: Insufficient documentation

## 2020-03-12 DIAGNOSIS — K5900 Constipation, unspecified: Secondary | ICD-10-CM | POA: Diagnosis present

## 2020-03-12 HISTORY — DX: Lactose intolerance, unspecified: E73.9

## 2020-03-12 LAB — CBC WITH DIFFERENTIAL/PLATELET
Abs Immature Granulocytes: 0.07 10*3/uL (ref 0.00–0.07)
Basophils Absolute: 0 10*3/uL (ref 0.0–0.1)
Basophils Relative: 0 %
Eosinophils Absolute: 0.1 10*3/uL (ref 0.0–0.5)
Eosinophils Relative: 1 %
HCT: 30.5 % — ABNORMAL LOW (ref 36.0–46.0)
Hemoglobin: 9.1 g/dL — ABNORMAL LOW (ref 12.0–15.0)
Immature Granulocytes: 1 %
Lymphocytes Relative: 15 %
Lymphs Abs: 1.6 10*3/uL (ref 0.7–4.0)
MCH: 23.5 pg — ABNORMAL LOW (ref 26.0–34.0)
MCHC: 29.8 g/dL — ABNORMAL LOW (ref 30.0–36.0)
MCV: 78.6 fL — ABNORMAL LOW (ref 80.0–100.0)
Monocytes Absolute: 1.1 10*3/uL — ABNORMAL HIGH (ref 0.1–1.0)
Monocytes Relative: 10 %
Neutro Abs: 7.8 10*3/uL — ABNORMAL HIGH (ref 1.7–7.7)
Neutrophils Relative %: 73 %
Platelets: 206 10*3/uL (ref 150–400)
RBC: 3.88 MIL/uL (ref 3.87–5.11)
RDW: 16.7 % — ABNORMAL HIGH (ref 11.5–15.5)
WBC: 10.6 10*3/uL — ABNORMAL HIGH (ref 4.0–10.5)
nRBC: 0 % (ref 0.0–0.2)

## 2020-03-12 LAB — URINALYSIS, ROUTINE W REFLEX MICROSCOPIC
Bilirubin Urine: NEGATIVE
Glucose, UA: NEGATIVE mg/dL
Ketones, ur: NEGATIVE mg/dL
Nitrite: NEGATIVE
Protein, ur: NEGATIVE mg/dL
Specific Gravity, Urine: 1.015 (ref 1.005–1.030)
pH: 6 (ref 5.0–8.0)

## 2020-03-12 LAB — COMPREHENSIVE METABOLIC PANEL
ALT: 11 U/L (ref 0–44)
AST: 10 U/L — ABNORMAL LOW (ref 15–41)
Albumin: 3 g/dL — ABNORMAL LOW (ref 3.5–5.0)
Alkaline Phosphatase: 42 U/L (ref 38–126)
Anion gap: 10 (ref 5–15)
BUN: 6 mg/dL (ref 6–20)
CO2: 24 mmol/L (ref 22–32)
Calcium: 7.8 mg/dL — ABNORMAL LOW (ref 8.9–10.3)
Chloride: 106 mmol/L (ref 98–111)
Creatinine, Ser: 0.65 mg/dL (ref 0.44–1.00)
GFR calc Af Amer: >60 mL/min (ref >60)
GFR calc non Af Amer: >60 mL/min (ref >60)
Glucose, Bld: 104 mg/dL — ABNORMAL HIGH (ref 70–99)
Potassium: 3.5 mmol/L (ref 3.5–5.1)
Sodium: 140 mmol/L (ref 135–145)
Total Bilirubin: 0.6 mg/dL (ref 0.3–1.2)
Total Protein: 6.1 g/dL — ABNORMAL LOW (ref 6.5–8.1)

## 2020-03-12 LAB — IRON AND TIBC
Iron: 8 ug/dL — ABNORMAL LOW (ref 28–170)
Saturation Ratios: 3 % — ABNORMAL LOW (ref 10.4–31.8)
TIBC: 301 ug/dL (ref 250–450)
UIBC: 293 ug/dL

## 2020-03-12 LAB — SARS CORONAVIRUS 2 BY RT PCR (HOSPITAL ORDER, PERFORMED IN ~~LOC~~ HOSPITAL LAB): SARS Coronavirus 2: NEGATIVE

## 2020-03-12 LAB — PROTIME-INR
INR: 1.1 (ref 0.8–1.2)
Prothrombin Time: 13.8 seconds (ref 11.4–15.2)

## 2020-03-12 LAB — HIV ANTIBODY (ROUTINE TESTING W REFLEX): HIV Screen 4th Generation wRfx: NONREACTIVE

## 2020-03-12 LAB — APTT: aPTT: 28 seconds (ref 24–36)

## 2020-03-12 LAB — LACTIC ACID, PLASMA: Lactic Acid, Venous: 0.6 mmol/L (ref 0.5–1.9)

## 2020-03-12 MED ORDER — SENNOSIDES-DOCUSATE SODIUM 8.6-50 MG PO TABS: 2.0000 | ORAL_TABLET | Freq: Every evening | ORAL | Status: DC | PRN

## 2020-03-12 MED ORDER — ONDANSETRON HCL 4 MG PO TABS: 4.0000 mg | ORAL_TABLET | Freq: Four times a day (QID) | ORAL | Status: DC | PRN

## 2020-03-12 MED ORDER — HYDROMORPHONE HCL 1 MG/ML IJ SOLN
0.5000 mg | INTRAMUSCULAR | Status: DC | PRN
  Administered 2020-03-12 (×2): 0.5 mg via INTRAVENOUS
  Filled 2020-03-12 (×2): qty 1

## 2020-03-12 MED ORDER — FERROUS SULFATE 325 (65 FE) MG PO TABS
325.0000 mg | ORAL_TABLET | Freq: Two times a day (BID) | ORAL | Status: DC
  Administered 2020-03-13 – 2020-03-16 (×6): 325 mg via ORAL
  Filled 2020-03-12 (×6): qty 1

## 2020-03-12 MED ORDER — OXYCODONE HCL 5 MG PO TABS
5.0000 mg | ORAL_TABLET | ORAL | Status: DC | PRN
  Administered 2020-03-12 – 2020-03-16 (×10): 5 mg via ORAL
  Filled 2020-03-12 (×10): qty 1

## 2020-03-12 MED ORDER — POLYETHYLENE GLYCOL 3350 17 G PO PACK: 17.0000 g | PACK | Freq: Every day | ORAL | Status: DC | PRN

## 2020-03-12 MED ORDER — PIPERACILLIN-TAZOBACTAM 3.375 G IVPB
3.3750 g | Freq: Three times a day (TID) | INTRAVENOUS | Status: DC
  Administered 2020-03-12 – 2020-03-16 (×12): 3.375 g via INTRAVENOUS
  Filled 2020-03-12 (×12): qty 50

## 2020-03-12 MED ORDER — MORPHINE SULFATE (PF) 2 MG/ML IV SOLN
2.0000 mg | INTRAVENOUS | Status: DC | PRN
  Administered 2020-03-12 – 2020-03-14 (×6): 2 mg via INTRAVENOUS
  Filled 2020-03-12 (×6): qty 1

## 2020-03-12 MED ORDER — IOHEXOL 300 MG/ML  SOLN
100.0000 mL | Freq: Once | INTRAMUSCULAR | Status: AC | PRN
  Administered 2020-03-12: 100 mL via INTRAVENOUS

## 2020-03-12 MED ORDER — ONDANSETRON HCL 4 MG/2ML IJ SOLN
4.0000 mg | Freq: Four times a day (QID) | INTRAMUSCULAR | Status: DC | PRN
  Administered 2020-03-14 – 2020-03-16 (×3): 4 mg via INTRAVENOUS
  Filled 2020-03-12 (×3): qty 2

## 2020-03-12 MED ORDER — SODIUM CHLORIDE 0.9 % IV SOLN
510.0000 mg | Freq: Once | INTRAVENOUS | Status: AC
  Administered 2020-03-12: 510 mg via INTRAVENOUS
  Filled 2020-03-12: qty 510

## 2020-03-12 MED ORDER — ACETAMINOPHEN 325 MG PO TABS
650.0000 mg | ORAL_TABLET | Freq: Four times a day (QID) | ORAL | Status: DC | PRN
  Administered 2020-03-12 – 2020-03-16 (×4): 650 mg via ORAL
  Filled 2020-03-12 (×5): qty 2

## 2020-03-12 MED ORDER — SENNOSIDES-DOCUSATE SODIUM 8.6-50 MG PO TABS
2.0000 | ORAL_TABLET | Freq: Two times a day (BID) | ORAL | Status: DC
  Administered 2020-03-12 – 2020-03-16 (×7): 2 via ORAL
  Filled 2020-03-12 (×8): qty 2

## 2020-03-12 MED ORDER — MORPHINE SULFATE (PF) 4 MG/ML IV SOLN
4.0000 mg | Freq: Once | INTRAVENOUS | Status: AC
  Administered 2020-03-12: 4 mg via INTRAVENOUS
  Filled 2020-03-12: qty 1

## 2020-03-12 MED ORDER — ONDANSETRON HCL 4 MG/2ML IJ SOLN
4.0000 mg | Freq: Once | INTRAMUSCULAR | Status: AC
  Administered 2020-03-12: 4 mg via INTRAVENOUS
  Filled 2020-03-12: qty 2

## 2020-03-12 MED ORDER — SODIUM CHLORIDE 0.9 % IV BOLUS
1000.0000 mL | Freq: Once | INTRAVENOUS | Status: AC
  Administered 2020-03-12: 1000 mL via INTRAVENOUS

## 2020-03-12 MED ORDER — SODIUM CHLORIDE (PF) 0.9 % IJ SOLN
INTRAMUSCULAR | Status: AC
  Filled 2020-03-12: qty 50

## 2020-03-12 MED ORDER — LACTATED RINGERS IV SOLN: INTRAVENOUS | Status: AC

## 2020-03-12 MED ORDER — ACETAMINOPHEN 650 MG RE SUPP: 650.0000 mg | Freq: Four times a day (QID) | RECTAL | Status: DC | PRN

## 2020-03-12 MED ORDER — PIPERACILLIN-TAZOBACTAM 3.375 G IVPB 30 MIN
3.3750 g | Freq: Once | INTRAVENOUS | Status: AC
  Administered 2020-03-12: 3.375 g via INTRAVENOUS
  Filled 2020-03-12: qty 50

## 2020-03-12 MED ORDER — HYDROMORPHONE HCL 1 MG/ML IJ SOLN
0.5000 mg | Freq: Once | INTRAMUSCULAR | Status: AC
  Administered 2020-03-12: 0.5 mg via INTRAVENOUS
  Filled 2020-03-12: qty 1

## 2020-03-12 NOTE — Consult Note (Addendum)
Chief Complaint: Patient was seen in consultation today for image guided biopsy versus aspiration of hepatic mass/? abscess  Chief Complaint  Patient presents with   Abdominal Cramping    Referring Physician(s): Connor,C  Supervising Physician: Jacqulynn Cadet  Patient Status: [facility] - ED  History of Present Illness: Stephanie [name] is a 39 y.o. female with prior history of lactose intolerance and UTIs who presented to [facility] ED last night with several day history of severe diffuse abdominal cramping/pain which radiates to her bilateral lower back region.  She is also complaining of some abdominal distention/constipation and occasional chills/urinary pressure as well as some intermittent rectal bleeding and intermittent headaches. She  has never had a colonoscopy.  Family history notable for IBS.  CT abdomen pelvis done today revealed:  1. Overall findings are highly concerning for colorectal carcinoma involving the sigmoid colon with an associated perforation and adjacent abscess and phlegmon formation as detailed above. Currently, no collection is amenable to percutaneous drainage given their small size and location. 2. New 2 cm mass in the right hepatic lobe concerning for metastatic disease to the liver until proven otherwise. 3. Enlarged regional lymph nodes as detailed above is concerning for nodal metastatic disease. 4. Large stool burden. 5. Prominent pelvic veins which can be seen in patients with pelvic congestion syndrome.  Current labs include WBC 10.6, hemoglobin 9.1, platelets 206k, creatinine 0.65, PT 13.8/INR 1.1, COVID-19 negative, blood cultures negative to date. Last temp 99.4.  Request now received for image guided liver mass/? abscess biopsy/aspiration.  Past Medical History:  Diagnosis Date   BV (bacterial vaginosis)    Lactose intolerance 03/12/2020   UTI (lower urinary tract infection)     Past Surgical History:  Procedure Laterality  Date   CESAREAN SECTION      Allergies: Erythromycin  Medications: Prior to Admission medications   Medication Sig Start Date End Date Taking? Authorizing Provider  busPIRone (BUSPAR) 10 MG tablet Take 10 mg by mouth daily as needed (anxiety).   Yes [provider]  ibuprofen (ADVIL) 200 MG tablet Take 200 mg by mouth every 6 (six) hours as needed for headache or moderate pain.   Yes [provider]  ciprofloxacin (CIPRO) 500 MG tablet Take 1 tablet (500 mg total) by mouth 2 (two) times daily. Patient not taking: Reported on 03/12/2020 09/01/18   Robyn Haber, MD  metroNIDAZOLE (FLAGYL) 500 MG tablet Take 1 tablet (500 mg total) by mouth 2 (two) times daily. Patient not taking: Reported on 03/12/2020 09/06/18   Raylene Everts, MD  PARAGARD INTRAUTERINE COPPER IU 1 Device by Intrauterine route once.     [provider]     Family History  Problem Relation Age of Onset   Heart disease Father    Colon cancer Neg Hx    Esophageal cancer Neg Hx     Social History   Socioeconomic History   Marital status: Single    Spouse name: Not on file   Number of children: Not on file   Years of education: Not on file   Highest education level: Not on file  Occupational History   Not on file  Tobacco Use   Smoking status: Former Smoker    Packs/day: 0.50    Years: 10.00    Pack years: 5.00    Types: Cigarettes   Smokeless tobacco: Never Used  Scientific laboratory technician Use: Never used  Substance and Sexual Activity   Alcohol use: Yes  Comment: seldom   Drug use: No   Sexual activity: Yes    Birth control/protection: None, Other-see comments    Comment: nuva ring  Other Topics Concern   Not on file  Social History Narrative   Not on file   Social Determinants of Health   Financial Resource Strain:    Difficulty of Paying Living Expenses:   Food Insecurity:    Worried About Charity fundraiser in the Last Year:    Academic librarian in the Last Year:   Transportation Needs:    Film/video editor (Medical):    Lack of Transportation (Non-Medical):   Physical Activity:    Days of Exercise per Week:    Minutes of Exercise per Session:   Stress:    Feeling of Stress :   Social Connections:    Frequency of Communication with Friends and Family:    Frequency of Social Gatherings with Friends and Family:    Attends Religious Services:    Active Member of Clubs or Organizations:    Attends Archivist Meetings:    Marital Status:       Review of Systems see above: denies chest pain, dyspnea, cough, nausea, vomiting.  Vital Signs: BP 120/68    Pulse 83    Temp 99.4 F (37.4 C) (Oral)    Resp 16    Ht 5\' 9"  (1.753 m)    Wt 165 lb (74.8 kg)    LMP 02/28/2020    SpO2 100%    BMI 24.37 kg/m   Physical Exam awake, alert.  Chest clear to auscultation bilaterally.  Heart with regular rate and rhythm.  Abdomen soft, few bowel sounds, mild- mod diffuse tenderness to palpation.  No significant lower extremity edema.  Imaging: CT ABDOMEN PELVIS W CONTRAST  Result Date: 03/12/2020 CLINICAL DATA:  Abdominal pain.  Concern for diverticulitis. EXAM: CT ABDOMEN AND PELVIS WITH CONTRAST TECHNIQUE: Multidetector CT imaging of the abdomen and pelvis was performed using the standard protocol following bolus administration of intravenous contrast. CONTRAST:  112mL OMNIPAQUE IOHEXOL 300 MG/ML  SOLN COMPARISON:  CT dated 07/15/2018. FINDINGS: Lower chest: The lung bases are clear. The heart size is normal. Hepatobiliary: There is a new 2.5 cm mass in the right hepatic lobe (axial series 2, image 17). Normal gallbladder.There is no biliary ductal dilation. Pancreas: Normal contours without ductal dilatation. No peripancreatic fluid collection. Spleen: Unremarkable. Adrenals/Urinary Tract: --Adrenal glands: Unremarkable. --Right kidney/ureter: No hydronephrosis or radiopaque kidney stones. --Left kidney/ureter: No  hydronephrosis or radiopaque kidney stones. --Urinary bladder: Unremarkable. Stomach/Bowel: --Stomach/Duodenum: No hiatal hernia or other gastric abnormality. Normal duodenal course and caliber. --Small bowel: Unremarkable. --Colon: There is a large amount of stool in the colon. There is extensive wall thickening of the sigmoid colon with adjacent inflammatory changes. There are apparent pockets of extraluminal gas and a possible extraluminal air and fluid collection concerning for an abscess. This is best appreciated on the coronal series. There are enlarged adjacent lymph nodes. There is suggestion of a phlegmonous collection abutting the sigmoid colon and uterus that has not yet coalesced into an abscess. This collection measures approximately 6.7 x 3.3 cm. --Appendix: Normal. Vascular/Lymphatic: Normal course and caliber of the major abdominal vessels. --No retroperitoneal lymphadenopathy. --there are multiple enlarged lymph nodes adjacent to the presumed sigmoid mass. There are enlarged superior rectal lymph nodes. --there are no enlarged inguinal lymph nodes. Reproductive: An IUD is in place. There are enlarged pelvic veins which can be seen  in patients with pelvic congestion syndrome. Other: There is a small amount of free fluid in the patient's pelvis. There is a small fat containing umbilical hernia. Musculoskeletal. No acute displaced fractures. IMPRESSION: 1. Overall findings are highly concerning for colorectal carcinoma involving the sigmoid colon with an associated perforation and adjacent abscess and phlegmon formation as detailed above. Currently, no collection is amenable to percutaneous drainage given their small size and location. 2. New 2 cm mass in the right hepatic lobe concerning for metastatic disease to the liver until proven otherwise. 3. Enlarged regional lymph nodes as detailed above is concerning for nodal metastatic disease. 4. Large stool burden. 5. Prominent pelvic veins which can be  seen in patients with pelvic congestion syndrome. These results were called by telephone at the time of interpretation on 03/12/2020 at 2:35 am to provider MIA [facility] , who verbally acknowledged these results. Electronically Signed   By: Constance Holster M.D.   On: 03/12/2020 02:40    Labs:  CBC: Recent Labs    03/11/20 2135 03/12/20 0615  WBC 10.5 10.6*  HGB 9.3* 9.1*  HCT 30.8* 30.5*  PLT 223 206    COAGS: Recent Labs    03/12/20 0615  INR 1.1  APTT 28    BMP: Recent Labs    03/11/20 2135 03/12/20 0615  NA 139 140  K 4.0 3.5  CL 102 106  CO2 25 24  GLUCOSE 92 104*  BUN 10 6  CALCIUM 8.6* 7.8*  CREATININE 0.72 0.65  GFRNONAA >60 >60  GFRAA >60 >60    LIVER FUNCTION TESTS: Recent Labs    03/11/20 2135 03/12/20 0615  BILITOT 0.6 0.6  AST 13* 10*  ALT 9 11  ALKPHOS 48 42  PROT 7.0 6.1*  ALBUMIN 3.4* 3.0*    TUMOR MARKERS: No results for input(s): AFPTM, CEA, CA199, CHROMGRNA in the last 8760 hours.  Assessment and Plan: 39 y.o. female with prior history of lactose intolerance and UTIs who presented to [facility] ED last night with several day history of severe diffuse abdominal cramping/pain which radiates to her bilateral lower back region.  She is also complaining of some abdominal distention/constipation and occasional chills/urinary pressure as well as some intermittent rectal bleeding and intermittent headaches. She  has never had a colonoscopy.  Family history notable for IBS.  CT abdomen pelvis done today revealed:  1. Overall findings are highly concerning for colorectal carcinoma involving the sigmoid colon with an associated perforation and adjacent abscess and phlegmon formation as detailed above. Currently, no collection is amenable to percutaneous drainage given their small size and location. 2. New 2 cm mass in the right hepatic lobe concerning for metastatic disease to the liver until proven otherwise. 3. Enlarged regional lymph nodes  as detailed above is concerning for nodal metastatic disease. 4. Large stool burden. 5. Prominent pelvic veins which can be seen in patients with pelvic congestion syndrome.  Current labs include WBC 10.6, hemoglobin 9.1, platelets 206k, creatinine 0.65,PT 13.8/ INR 1.1, COVID-19 negative, blood cultures negative to date. Last temp 99.4.  Request now received for image guided liver mass/? abscess biopsy/aspiration.  Imaging studies have been reviewed by Dr. Laurence Ferrari.Risks and benefits of procedure was discussed with the patient  including, but not limited to bleeding, infection, damage to adjacent structures or low yield requiring additional tests.  All of the questions were answered and there is agreement to proceed.  Consent signed and in chart.  Procedure scheduled for 6/15 am  Thank you for this interesting consult.  I greatly enjoyed meeting Stephanie [name] and look forward to participating in their care.  A copy of this report was sent to the requesting provider on this date.  Electronically Signed: D. Rowe Robert, PA-C 03/12/2020, 10:45 AM   I spent a total of 25 minutes    in face to face in clinical consultation, greater than 50% of which was counseling/coordinating care for image guided biopsy versus aspiration of liver mass/? abscess

## 2020-03-12 NOTE — H&P (Addendum)
Surgical Evaluation Requesting provider: Joline Maxcy PA-C  Chief Complaint: abdominal pain  HPI: Otherwise healthy 39 year old woman who presents to the [facility], ER this evening with several days of severe, diffuse abdominal cramping and pain which radiates to her bilateral lower back.  This was initially intermittent but has become more constant and has worsened.  She notes some associated distention and constipation.  She has tried taking MiraLAX with no improvement, and has been treating the pain with ibuprofen which has afforded minimal relief.  She notes associated chills, urinary pressure.  Denies fever, nausea, vomiting, or melena.  Her last bowel movement was yesterday.  She does endorse a history of intermittent bright red blood per rectum, noting blood on the outside of her stools, over the last few years.  She has associated this with consuming dairy products and other dietary choices, and has not been a consistent issue.  She also notes that a few years ago there was some concern she may have Crohn's disease but no formal diagnosis was made, and no medications were initiated.  She has never had a colonoscopy.  There is no family history of colon cancer however her mother and brother both have irritable bowel syndrome.  She denies any unintended weight loss and in fact has gained a little bit of weight recently. Of note in the last couple of years she has been to the emergency room a few times complaining of abdominal pain/ rectal bleeding and has had guaiac positive stool, negative CT scans.   Allergies  Allergen Reactions  . Erythromycin     Past Medical History:  Diagnosis Date  . BV (bacterial vaginosis)   . UTI (lower urinary tract infection)     Past Surgical History:  Procedure Laterality Date  . CESAREAN SECTION      Family History  Problem Relation Age of Onset  . Heart disease Father   . Colon cancer Neg Hx   . Esophageal cancer Neg Hx     Social History    Socioeconomic History  . Marital status: Single    Spouse name: Not on file  . Number of children: Not on file  . Years of education: Not on file  . Highest education level: Not on file  Occupational History  . Not on file  Tobacco Use  . Smoking status: Former Smoker    Packs/day: 0.50    Years: 10.00    Pack years: 5.00    Types: Cigarettes  . Smokeless tobacco: Never Used  Vaping Use  . Vaping Use: Never used  Substance and Sexual Activity  . Alcohol use: Yes    Comment: seldom  . Drug use: No  . Sexual activity: Yes    Birth control/protection: None, Other-see comments    Comment: nuva ring  Other Topics Concern  . Not on file  Social History Narrative  . Not on file   Social Determinants of Health   Financial Resource Strain:   . Difficulty of Paying Living Expenses:   Food Insecurity:   . Worried About Charity fundraiser in the Last Year:   . Arboriculturist in the Last Year:   Transportation Needs:   . Film/video editor (Medical):   [name] Lack of Transportation (Non-Medical):   Physical Activity:   . Days of Exercise per Week:   . Minutes of Exercise per Session:   Stress:   . Feeling of Stress :   Social Connections:   . Frequency  of Communication with Friends and Family:   . Frequency of Social Gatherings with Friends and Family:   . Attends Religious Services:   . Active Member of Clubs or Organizations:   . Attends Archivist Meetings:   [name] Marital Status:     No current facility-administered medications on file prior to encounter.   Current Outpatient Medications on File Prior to Encounter  Medication Sig Dispense Refill  . busPIRone (BUSPAR) 10 MG tablet Take 10 mg by mouth daily as needed (anxiety).    [name] ibuprofen (ADVIL) 200 MG tablet Take 200 mg by mouth every 6 (six) hours as needed for headache or moderate pain.    . ciprofloxacin (CIPRO) 500 MG tablet Take 1 tablet (500 mg total) by mouth 2 (two) times daily. (Patient not  taking: Reported on 03/12/2020) 10 tablet 0  . metroNIDAZOLE (FLAGYL) 500 MG tablet Take 1 tablet (500 mg total) by mouth 2 (two) times daily. (Patient not taking: Reported on 03/12/2020) 14 tablet 0  . PARAGARD INTRAUTERINE COPPER IU 1 Device by Intrauterine route once.       Review of Systems: a complete, 10pt review of systems was completed with pertinent positives and negatives as documented in the HPI  Physical Exam: Vitals:   03/12/20 0153 03/12/20 0322  BP: 101/61 126/69  Pulse: 83 100  Resp: 17 17  Temp:    SpO2: 98% 99%   Gen: A&Ox3, no distress  Eyes: lids and conjunctivae normal, no icterus. Pupils equally round and reactive to light.  Neck: supple without mass or thyromegaly Chest: respiratory effort is normal. No crepitus or tenderness on palpation of the chest. Breath sounds equal.  Cardiovascular: RRR with palpable distal pulses, no pedal edema Gastrointestinal: soft, nondistended, focally tender in the left lower quadrant and suprapubic region.  No peritonitis. No mass, hepatomegaly or splenomegaly. No hernia. Lymphatic: no lymphadenopathy in the neck or groin Muscoloskeletal: no clubbing or cyanosis of the fingers.  Strength is symmetrical throughout.  Range of motion of bilateral upper and lower extremities normal without pain, crepitation or contracture. Neuro: cranial nerves grossly intact.  Sensation intact to light touch diffusely. Psych: appropriate mood and affect, normal insight/judgment intact  Skin: warm and dry   CBC Latest Ref Rng & Units 03/11/2020 08/31/2018 07/28/2018  WBC 4.0 - 10.5 K/uL 10.5 6.5 5.6  Hemoglobin 12.0 - 15.0 g/dL 9.3(L) 12.4 12.2  Hematocrit 36 - 46 % 30.8(L) 39.2 39.1  Platelets 150 - 400 K/uL 223 186 182    CMP Latest Ref Rng & Units 03/11/2020 08/31/2018 07/28/2018  Glucose 70 - 99 mg/dL 92 99 92  BUN 6 - 20 mg/dL 10 12 10   Creatinine 0.44 - 1.00 mg/dL 0.72 0.72 0.67  Sodium 135 - 145 mmol/L 139 141 140  Potassium 3.5 - 5.1 mmol/L  4.0 3.9 3.9  Chloride 98 - 111 mmol/L 102 109 110  CO2 22 - 32 mmol/L 25 24 23   Calcium 8.9 - 10.3 mg/dL 8.6(L) 8.6(L) 8.4(L)  Total Protein 6.5 - 8.1 g/dL 7.0 6.6 -  Total Bilirubin 0.3 - 1.2 mg/dL 0.6 0.4 -  Alkaline Phos 38 - 126 U/L 48 49 -  AST 15 - 41 U/L 13(L) 19 -  ALT 0 - 44 U/L 9 14 -    No results found for: INR, PROTIME  Imaging: CT ABDOMEN PELVIS W CONTRAST  Result Date: 03/12/2020 CLINICAL DATA:  Abdominal pain.  Concern for diverticulitis. EXAM: CT ABDOMEN AND PELVIS WITH CONTRAST TECHNIQUE: Multidetector  CT imaging of the abdomen and pelvis was performed using the standard protocol following bolus administration of intravenous contrast. CONTRAST:  12mL OMNIPAQUE IOHEXOL 300 MG/ML  SOLN COMPARISON:  CT dated 07/15/2018. FINDINGS: Lower chest: The lung bases are clear. The heart size is normal. Hepatobiliary: There is a new 2.5 cm mass in the right hepatic lobe (axial series 2, image 17). Normal gallbladder.There is no biliary ductal dilation. Pancreas: Normal contours without ductal dilatation. No peripancreatic fluid collection. Spleen: Unremarkable. Adrenals/Urinary Tract: --Adrenal glands: Unremarkable. --Right kidney/ureter: No hydronephrosis or radiopaque kidney stones. --Left kidney/ureter: No hydronephrosis or radiopaque kidney stones. --Urinary bladder: Unremarkable. Stomach/Bowel: --Stomach/Duodenum: No hiatal hernia or other gastric abnormality. Normal duodenal course and caliber. --Small bowel: Unremarkable. --Colon: There is a large amount of stool in the colon. There is extensive wall thickening of the sigmoid colon with adjacent inflammatory changes. There are apparent pockets of extraluminal gas and a possible extraluminal air and fluid collection concerning for an abscess. This is best appreciated on the coronal series. There are enlarged adjacent lymph nodes. There is suggestion of a phlegmonous collection abutting the sigmoid colon and uterus that has not yet  coalesced into an abscess. This collection measures approximately 6.7 x 3.3 cm. --Appendix: Normal. Vascular/Lymphatic: Normal course and caliber of the major abdominal vessels. --No retroperitoneal lymphadenopathy. --there are multiple enlarged lymph nodes adjacent to the presumed sigmoid mass. There are enlarged superior rectal lymph nodes. --there are no enlarged inguinal lymph nodes. Reproductive: An IUD is in place. There are enlarged pelvic veins which can be seen in patients with pelvic congestion syndrome. Other: There is a small amount of free fluid in the patient's pelvis. There is a small fat containing umbilical hernia. Musculoskeletal. No acute displaced fractures. IMPRESSION: 1. Overall findings are highly concerning for colorectal carcinoma involving the sigmoid colon with an associated perforation and adjacent abscess and phlegmon formation as detailed above. Currently, no collection is amenable to percutaneous drainage given their small size and location. 2. New 2 cm mass in the right hepatic lobe concerning for metastatic disease to the liver until proven otherwise. 3. Enlarged regional lymph nodes as detailed above is concerning for nodal metastatic disease. 4. Large stool burden. 5. Prominent pelvic veins which can be seen in patients with pelvic congestion syndrome. These results were called by telephone at the time of interpretation on 03/12/2020 at 2:35 am to provider MIA [facility] , who verbally acknowledged these results. Electronically Signed   By: Constance Holster M.D.   On: 03/12/2020 02:40     A/P: 39 year old woman with localized/ contained sigmoid colon perforation in the setting of phlegmon and extensive colonic inflammation, as well as a new 2 cm right hepatic lobe mass.  The liver lesion and history of intermittent rectal bleeding prompts concern for malignancy despite her young age and lack of family history. Given that the perforation is currently contained and she has a  fairly benign exam with no fever or white count, I think it is reasonable to begin as we would treat diverticulitis with contained perforation- with fluid resuscitation, empiric broad-spectrum antibiotics, pain control; and initiate work-up including CEA and liver mass biopsy if feasible in IR. I discussed with her that if the primary process here is diverticulitis, this perforation may heal with nonoperative treatment, may require drain placement eventually for abscess formation, and ultimately lead to an elective sigmoid resection in the best case scenario.  If the perforation progresses which would be a more likely scenario if this does  in fact turn out to be malignant, she will require operative intervention this admission which will likely be a laparotomy, sigmoid resection and end colostomy.  Surgery team will continue to follow closely.     There are no problems to display for this patient.      Romana Juniper, MD [facility], [place]  See AMION to contact appropriate on-call provider

## 2020-03-12 NOTE — ED Notes (Addendum)
Primary RN Ronalee Belts attempted report on pt starting around 1440, floor did not put in purple man until 1510, for call at 1520, delaying pt to floor

## 2020-03-12 NOTE — Progress Notes (Signed)
39 year old female admitted to the hospital with complaints of abdominal pain cramping nausea and vomiting.  Tells me previous history of IBS managed by her PCP.  CT abdomen showed colon mass concerning for colorectal carcinoma with perforation and adjacent abscess there is also a 2 cm right hepatic lobe liver mass. When I saw the patient at bedside she was reporting of lower abdominal pain.  Denies any family history of malignancy, denies any weight loss or notable blood in her stool.  Overall her vital signs are stable but she is in discomfort secondary to lower abdominal pain  Her lab work is consistent with iron deficiency/microcytic anemia.  IR has been consulted to obtain biopsy of the liver mass.  CEA ordered.  Depending on pathology report patient will require oncology evaluation.  She also may need surgical intervention, general surgery team is following the patient.  Once her liver biopsy procedure is completed, she can be on full liquid diet.  I will go ahead and order IV morphine, oral oxycodone along with aggressive bowel regimen.  IV iron today followed by p.o. starting tomorrow.  Please call with further questions as necessary  Time Spent 20 mins  Gerlean Ren MD [facility]

## 2020-03-12 NOTE — Progress Notes (Signed)
Subjective: CC: Abdominal pain Doing well. Pain is in the lower abdomen, comes in waves, crampy in nature and slightly improved since admission. No n/v.   Objective: Vital signs in last 24 hours: Temp:  [99.4 F (37.4 C)-99.9 F (37.7 C)] 99.9 F (37.7 C) (06/14 1610) Pulse Rate:  [73-116] 86 (06/14 1610) Resp:  [14-20] 14 (06/14 1610) BP: (101-126)/(58-73) 116/62 (06/14 1610) SpO2:  [97 %-100 %] 98 % (06/14 1610) Weight:  [74.8 kg] 74.8 kg (06/13 2103) Last BM Date: 03/11/20  Intake/Output from previous day: 06/13 0701 - 06/14 0700 In: 1100 [IV Piggyback:1100] Out: -  Intake/Output this shift: Total I/O In: 160 [P.O.:60; IV Piggyback:100] Out: -   PE: Gen:  Alert, NAD, pleasant Lungs: Rate and effort normal  Abd: Soft, ND, tenderness of the lower abdomen without peritonitis, +BS Psych: A&Ox3  Skin: no rashes noted, warm and dry   Lab Results:  Recent Labs    03/11/20 2135 03/12/20 0615  WBC 10.5 10.6*  HGB 9.3* 9.1*  HCT 30.8* 30.5*  PLT 223 206   BMET Recent Labs    03/11/20 2135 03/12/20 0615  NA 139 140  K 4.0 3.5  CL 102 106  CO2 25 24  GLUCOSE 92 104*  BUN 10 6  CREATININE 0.72 0.65  CALCIUM 8.6* 7.8*   PT/INR Recent Labs    03/12/20 0615  LABPROT 13.8  INR 1.1   CMP     Component Value Date/Time   NA 140 03/12/2020 0615   K 3.5 03/12/2020 0615   CL 106 03/12/2020 0615   CO2 24 03/12/2020 0615   GLUCOSE 104 (H) 03/12/2020 0615   BUN 6 03/12/2020 0615   CREATININE 0.65 03/12/2020 0615   CALCIUM 7.8 (L) 03/12/2020 0615   PROT 6.1 (L) 03/12/2020 0615   ALBUMIN 3.0 (L) 03/12/2020 0615   AST 10 (L) 03/12/2020 0615   ALT 11 03/12/2020 0615   ALKPHOS 42 03/12/2020 0615   BILITOT 0.6 03/12/2020 0615   GFRNONAA >60 03/12/2020 0615   GFRAA >60 03/12/2020 0615   Lipase     Component Value Date/Time   LIPASE 24 03/11/2020 2135       Studies/Results: CT ABDOMEN PELVIS W CONTRAST  Result Date: 03/12/2020 CLINICAL DATA:   Abdominal pain.  Concern for diverticulitis. EXAM: CT ABDOMEN AND PELVIS WITH CONTRAST TECHNIQUE: Multidetector CT imaging of the abdomen and pelvis was performed using the standard protocol following bolus administration of intravenous contrast. CONTRAST:  142mL OMNIPAQUE IOHEXOL 300 MG/ML  SOLN COMPARISON:  CT dated 07/15/2018. FINDINGS: Lower chest: The lung bases are clear. The heart size is normal. Hepatobiliary: There is a new 2.5 cm mass in the right hepatic lobe (axial series 2, image 17). Normal gallbladder.There is no biliary ductal dilation. Pancreas: Normal contours without ductal dilatation. No peripancreatic fluid collection. Spleen: Unremarkable. Adrenals/Urinary Tract: --Adrenal glands: Unremarkable. --Right kidney/ureter: No hydronephrosis or radiopaque kidney stones. --Left kidney/ureter: No hydronephrosis or radiopaque kidney stones. --Urinary bladder: Unremarkable. Stomach/Bowel: --Stomach/Duodenum: No hiatal hernia or other gastric abnormality. Normal duodenal course and caliber. --Small bowel: Unremarkable. --Colon: There is a large amount of stool in the colon. There is extensive wall thickening of the sigmoid colon with adjacent inflammatory changes. There are apparent pockets of extraluminal gas and a possible extraluminal air and fluid collection concerning for an abscess. This is best appreciated on the coronal series. There are enlarged adjacent lymph nodes. There is suggestion of a phlegmonous collection abutting the sigmoid  colon and uterus that has not yet coalesced into an abscess. This collection measures approximately 6.7 x 3.3 cm. --Appendix: Normal. Vascular/Lymphatic: Normal course and caliber of the major abdominal vessels. --No retroperitoneal lymphadenopathy. --there are multiple enlarged lymph nodes adjacent to the presumed sigmoid mass. There are enlarged superior rectal lymph nodes. --there are no enlarged inguinal lymph nodes. Reproductive: An IUD is in place. There are  enlarged pelvic veins which can be seen in patients with pelvic congestion syndrome. Other: There is a small amount of free fluid in the patient's pelvis. There is a small fat containing umbilical hernia. Musculoskeletal. No acute displaced fractures. IMPRESSION: 1. Overall findings are highly concerning for colorectal carcinoma involving the sigmoid colon with an associated perforation and adjacent abscess and phlegmon formation as detailed above. Currently, no collection is amenable to percutaneous drainage given their small size and location. 2. New 2 cm mass in the right hepatic lobe concerning for metastatic disease to the liver until proven otherwise. 3. Enlarged regional lymph nodes as detailed above is concerning for nodal metastatic disease. 4. Large stool burden. 5. Prominent pelvic veins which can be seen in patients with pelvic congestion syndrome. These results were called by telephone at the time of interpretation on 03/12/2020 at 2:35 am to provider [facility] , who verbally acknowledged these results. Electronically Signed   By: Constance Holster M.D.   On: 03/12/2020 02:40    Anti-infectives: Anti-infectives (From admission, onward)   Start     Dose/Rate Route Frequency Ordered Stop   03/12/20 1200  piperacillin-tazobactam (ZOSYN) IVPB 3.375 g     Discontinue     3.375 g 12.5 mL/hr over 240 Minutes Intravenous Every 8 hours 03/12/20 0607     03/12/20 0300  piperacillin-tazobactam (ZOSYN) IVPB 3.375 g        3.375 g 100 mL/hr over 30 Minutes Intravenous  Once 03/12/20 0247 03/12/20 0525       Assessment/Plan 39 year old woman with localized/ contained sigmoid colon perforation in the setting of phlegmon and extensive colonic inflammation, as well as a new 2 cm right hepatic lobe mass.  The liver lesion and history of intermittent rectal bleeding prompts concern for malignancy, although this could be just diverticulitis with a contained perforation as the primary process  - No  indication for emergent surgery at the present  - Continue iv abx and bowel rest - IR to take today for US guided biopsy vs aspiration of hepatic lesion vs. abscess - CEA pending  - We will continue to follow along with you   FEN - NPO, IVF VTE - SCDs ID - Zosyn   LOS: 0 days    Jillyn Ledger , [facility] 03/12/2020, 4:46 PM Please see Amion for pager number during day hours 7:00am-4:30pm

## 2020-03-12 NOTE — ED Provider Notes (Signed)
[facility] DEPT Provider Note   CSN: [id number] Arrival date & time: 03/11/20  2056     History Chief Complaint  Patient presents with  . Abdominal Cramping    Stephanie [name] is a 39 y.o. female with a history of recurrent UTIs and bacterial vaginosis who presents to the emergency department with a chief complaint of abdominal pain.  The patient reports that she has been having severe, diffuse abdominal cramping and pain for the last 3 days.  Pain radiates to her bilateral low back.  Pain initially was intermittent, but has become constant as symptoms have worsened.  Yesterday, her abdomen was hard and distended, and she was concerned she may be constipated and took MiraLAX with no significant improvement.  Pain has been so intense that she has barely been able to get out of bed over the last 2 days.  She has been treating her symptoms with ibuprofen, last dose at 1600, with minimal to no improvement.  She reports associated chills, urinary pressure and constipation.  Last bowel movement was yesterday.    No fever, nausea, vomiting, diarrhea, vaginal pain or discharge, melena, hematochezia, chest pain, shortness of breath, URI symptoms.    She is a former smoker. Surgical history includes C-section.  She was concerned her symptoms may be secondary to drinking milk a couple of days ago when the initially began, but she has since avoided dairy products and symptoms have not improved.  She does note that a few years ago there was concern that she may have Crohn's disease because she has intermittently had bright red blood in her stool in the past, but she does not take any medications and has not had a formal diagnosis and is not established with a gastroenterologist.  No concerns for STIs.  She has not been sexually active and 8 months and has an IUD in place.  The history is provided by the patient. No language interpreter was used.       Past Medical  History:  Diagnosis Date  . BV (bacterial vaginosis)   . Lactose intolerance 03/12/2020  . UTI (lower urinary tract infection)     Patient Active Problem List   Diagnosis Date Noted  . Lactose intolerance 03/12/2020  . Microcytic anemia 03/12/2020  . Colorectal carcinoma ([place]) 03/12/2020  . Peritonitis with abscess of intestine ([place]) 03/12/2020    Past Surgical History:  Procedure Laterality Date  . CESAREAN SECTION       OB History    Gravida  3   Para  1   Term  1   Preterm      AB  1   Living  1     SAB      TAB  1   Ectopic      Multiple      Live Births              Family History  Problem Relation Age of Onset  . Heart disease Father   . Colon cancer Neg Hx   . Esophageal cancer Neg Hx     Social History   Tobacco Use  . Smoking status: Former Smoker    Packs/day: 0.50    Years: 10.00    Pack years: 5.00    Types: Cigarettes  . Smokeless tobacco: Never Used  Vaping Use  . Vaping Use: Never used  Substance Use Topics  . Alcohol use: Yes    Comment: seldom  . Drug use:  No    Home Medications Prior to Admission medications   Medication Sig Start Date End Date Taking? Authorizing Provider  busPIRone (BUSPAR) 10 MG tablet Take 10 mg by mouth daily as needed (anxiety).   Yes [provider]  ibuprofen (ADVIL) 200 MG tablet Take 200 mg by mouth every 6 (six) hours as needed for headache or moderate pain.   Yes [provider]  ciprofloxacin (CIPRO) 500 MG tablet Take 1 tablet (500 mg total) by mouth 2 (two) times daily. Patient not taking: Reported on 03/12/2020 09/01/18   Robyn Haber, MD  metroNIDAZOLE (FLAGYL) 500 MG tablet Take 1 tablet (500 mg total) by mouth 2 (two) times daily. Patient not taking: Reported on 03/12/2020 09/06/18   Raylene Everts, MD  PARAGARD INTRAUTERINE COPPER IU 1 Device by Intrauterine route once.     [provider]    Allergies    Erythromycin  Review of Systems   Review  of Systems  Constitutional: Positive for chills. Negative for activity change and fever.  HENT: Negative for congestion.   Respiratory: Negative for shortness of breath and wheezing.   Cardiovascular: Negative for chest pain.  Gastrointestinal: Positive for abdominal pain and constipation. Negative for diarrhea, nausea and vomiting.  Genitourinary: Negative for dysuria, flank pain, genital sores, menstrual problem, urgency, vaginal bleeding, vaginal discharge and vaginal pain.       Urinary pressure  Musculoskeletal: Positive for back pain. Negative for myalgias, neck pain and neck stiffness.  Skin: Negative for rash.  Allergic/Immunologic: Negative for immunocompromised state.  Neurological: Negative for seizures, syncope, weakness and headaches.  Psychiatric/Behavioral: Negative for confusion.    Physical Exam Updated Vital Signs BP 116/65   Pulse 78   Temp 99.4 F (37.4 C) (Oral)   Resp 16   Ht 5\' 9"  (1.753 m)   Wt 74.8 kg   LMP 02/28/2020   SpO2 97%   BMI 24.37 kg/m   Physical Exam Vitals and nursing note reviewed.  Constitutional:      General: She is not in acute distress.    Appearance: She is not ill-appearing, toxic-appearing or diaphoretic.     Comments: Uncomfortable appearing.  HENT:     Head: Normocephalic.  Eyes:     Conjunctiva/sclera: Conjunctivae normal.  Cardiovascular:     Rate and Rhythm: Normal rate and regular rhythm.     Pulses: Normal pulses.     Heart sounds: Normal heart sounds. No murmur heard.  No friction rub. No gallop.   Pulmonary:     Effort: Pulmonary effort is normal. No respiratory distress.     Breath sounds: No stridor. No wheezing, rhonchi or rales.  Chest:     Chest wall: No tenderness.  Abdominal:     General: There is distension.     Palpations: Abdomen is soft. There is no mass.     Tenderness: There is abdominal tenderness. There is right CVA tenderness, left CVA tenderness, guarding and rebound.     Hernia: No hernia is  present.     Comments: Hypoactive bowel sounds in all 4 quadrants.  She is diffusely tender to palpation throughout the abdomen with guarding.  There is rebound tenderness in the left lower quadrant.  No focal tenderness over McBurney's point.  Negative Murphy sign.  She has CVA tenderness bilaterally.  Musculoskeletal:     Cervical back: Neck supple.     Right lower leg: No edema.     Left lower leg: No edema.  Skin:  General: Skin is warm.     Coloration: Skin is not jaundiced.     Findings: No rash.  Neurological:     Mental Status: She is alert.  Psychiatric:        Behavior: Behavior normal.     ED Results / Procedures / Treatments   Labs (all labs ordered are listed, but only abnormal results are displayed) Labs Reviewed  COMPREHENSIVE METABOLIC PANEL - Abnormal; Notable for the following components:      Result Value   Calcium 8.6 (*)    Albumin 3.4 (*)    AST 13 (*)    All other components within normal limits  CBC - Abnormal; Notable for the following components:   Hemoglobin 9.3 (*)    HCT 30.8 (*)    MCV 78.6 (*)    MCH 23.7 (*)    RDW 16.5 (*)    All other components within normal limits  URINALYSIS, ROUTINE W REFLEX MICROSCOPIC - Abnormal; Notable for the following components:   Hgb urine dipstick SMALL (*)    Leukocytes,Ua MODERATE (*)    Bacteria, UA MANY (*)    All other components within normal limits  IRON AND TIBC - Abnormal; Notable for the following components:   Iron 8 (*)    Saturation Ratios 3 (*)    All other components within normal limits  CBC WITH DIFFERENTIAL/PLATELET - Abnormal; Notable for the following components:   WBC 10.6 (*)    Hemoglobin 9.1 (*)    HCT 30.5 (*)    MCV 78.6 (*)    MCH 23.5 (*)    MCHC 29.8 (*)    RDW 16.7 (*)    Neutro Abs 7.8 (*)    Monocytes Absolute 1.1 (*)    All other components within normal limits  COMPREHENSIVE METABOLIC PANEL - Abnormal; Notable for the following components:   Glucose, Bld 104 (*)      Calcium 7.8 (*)    Total Protein 6.1 (*)    Albumin 3.0 (*)    AST 10 (*)    All other components within normal limits  SARS CORONAVIRUS 2 BY RT PCR (HOSPITAL ORDER, [facility])  CULTURE, BLOOD (ROUTINE X 2)  CULTURE, BLOOD (ROUTINE X 2)  LIPASE, BLOOD  LACTIC ACID, PLASMA  APTT  PROTIME-INR  CEA  HIV ANTIBODY (ROUTINE TESTING W REFLEX)  I-STAT BETA HCG BLOOD, ED ([facility], [facility], [facility] ONLY)    EKG None  Radiology CT ABDOMEN PELVIS W CONTRAST  Result Date: 03/12/2020 CLINICAL DATA:  Abdominal pain.  Concern for diverticulitis. EXAM: CT ABDOMEN AND PELVIS WITH CONTRAST TECHNIQUE: Multidetector CT imaging of the abdomen and pelvis was performed using the standard protocol following bolus administration of intravenous contrast. CONTRAST:  166mL OMNIPAQUE IOHEXOL 300 MG/ML  SOLN COMPARISON:  CT dated 07/15/2018. FINDINGS: Lower chest: The lung bases are clear. The heart size is normal. Hepatobiliary: There is a new 2.5 cm mass in the right hepatic lobe (axial series 2, image 17). Normal gallbladder.There is no biliary ductal dilation. Pancreas: Normal contours without ductal dilatation. No peripancreatic fluid collection. Spleen: Unremarkable. Adrenals/Urinary Tract: --Adrenal glands: Unremarkable. --Right kidney/ureter: No hydronephrosis or radiopaque kidney stones. --Left kidney/ureter: No hydronephrosis or radiopaque kidney stones. --Urinary bladder: Unremarkable. Stomach/Bowel: --Stomach/Duodenum: No hiatal hernia or other gastric abnormality. Normal duodenal course and caliber. --Small bowel: Unremarkable. --Colon: There is a large amount of stool in the colon. There is extensive wall thickening of the sigmoid colon with adjacent inflammatory  changes. There are apparent pockets of extraluminal gas and a possible extraluminal air and fluid collection concerning for an abscess. This is best appreciated on the coronal series. There are enlarged adjacent lymph nodes. There is  suggestion of a phlegmonous collection abutting the sigmoid colon and uterus that has not yet coalesced into an abscess. This collection measures approximately 6.7 x 3.3 cm. --Appendix: Normal. Vascular/Lymphatic: Normal course and caliber of the major abdominal vessels. --No retroperitoneal lymphadenopathy. --there are multiple enlarged lymph nodes adjacent to the presumed sigmoid mass. There are enlarged superior rectal lymph nodes. --there are no enlarged inguinal lymph nodes. Reproductive: An IUD is in place. There are enlarged pelvic veins which can be seen in patients with pelvic congestion syndrome. Other: There is a small amount of free fluid in the patient's pelvis. There is a small fat containing umbilical hernia. Musculoskeletal. No acute displaced fractures. IMPRESSION: 1. Overall findings are highly concerning for colorectal carcinoma involving the sigmoid colon with an associated perforation and adjacent abscess and phlegmon formation as detailed above. Currently, no collection is amenable to percutaneous drainage given their small size and location. 2. New 2 cm mass in the right hepatic lobe concerning for metastatic disease to the liver until proven otherwise. 3. Enlarged regional lymph nodes as detailed above is concerning for nodal metastatic disease. 4. Large stool burden. 5. Prominent pelvic veins which can be seen in patients with pelvic congestion syndrome. These results were called by telephone at the time of interpretation on 03/12/2020 at 2:35 am to provider Ogechi Kuehnel [facility] , who verbally acknowledged these results. Electronically Signed   By: Constance Holster M.D.   On: 03/12/2020 02:40    Procedures .Critical Care Performed by: Joanne Gavel, PA-C Authorized by: Joanne Gavel, PA-C   Critical care provider statement:    Critical care time (minutes):  55   Critical care time was exclusive of:  Separately billable procedures and treating other patients and teaching time    Critical care was necessary to treat or prevent imminent or life-threatening deterioration of the following conditions:  Sepsis   Critical care was time spent personally by me on the following activities:  Ordering and performing treatments and interventions, ordering and review of laboratory studies, ordering and review of radiographic studies, pulse oximetry, re-evaluation of patient's condition, review of old charts, obtaining history from patient or surrogate, examination of patient, evaluation of patient's response to treatment, discussions with consultants and development of treatment plan with patient or surrogate   I assumed direction of critical care for this patient from another provider in my specialty: no     (including critical care time)  Medications Ordered in ED Medications  sodium chloride (PF) 0.9 % injection (has no administration in time range)  ondansetron (ZOFRAN) tablet 4 mg (has no administration in time range)    Or  ondansetron (ZOFRAN) injection 4 mg (has no administration in time range)  lactated ringers infusion ( Intravenous New Bag/Given 03/12/20 0627)  acetaminophen (TYLENOL) tablet 650 mg (has no administration in time range)    Or  acetaminophen (TYLENOL) suppository 650 mg (has no administration in time range)  HYDROmorphone (DILAUDID) injection 0.5 mg (0.5 mg Intravenous Given 03/12/20 0628)  piperacillin-tazobactam (ZOSYN) IVPB 3.375 g (has no administration in time range)  sodium chloride flush (NS) 0.9 % injection 3 mL (3 mLs Intravenous Given 03/12/20 0330)  morphine 4 MG/ML injection 4 mg (4 mg Intravenous Given 03/12/20 0143)  ondansetron (ZOFRAN) injection 4 mg (4 mg  Intravenous Given 03/12/20 0142)  sodium chloride 0.9 % bolus 1,000 mL (0 mLs Intravenous Stopped 03/12/20 0309)  iohexol (OMNIPAQUE) 300 MG/ML solution 100 mL (100 mLs Intravenous Contrast Given 03/12/20 0211)  piperacillin-tazobactam (ZOSYN) IVPB 3.375 g (0 g Intravenous Stopped 03/12/20 0525)    HYDROmorphone (DILAUDID) injection 0.5 mg (0.5 mg Intravenous Given 03/12/20 0330)    ED Course  I have reviewed the triage vital signs and the nursing notes.  Pertinent labs & imaging results that were available during my care of the patient were reviewed by me and considered in my medical decision making (see chart for details).    MDM Rules/Calculators/A&P                          39 year old female with a history of recurrent UTIs and bacterial vaginosis presenting with 3 days of abdominal and back pain, chills, urinary pressure, and difficulty having bowel movements.  Borderline leukocytosis at 10.5.  UA concerning for infection.  Urine culture sent.  No electrolyte derangements.  However, given concern for chills and her physical exam, she will require CT abdomen pelvis for further evaluation as differential diagnosis includes pyelonephritis, diverticulitis, bowel obstruction, obstructive uropathy, or ruptured appendicitis given that she has peritonitis.  IV fluids, morphine, and Zofran ordered for symptoms.  The patient was discussed with Dr. Florina Ou, attending physician.  Notified by Dr. Nyoka Cowden, radiology, CT is highly concerning for colorectal carcinoma of the sigmoid colon with an associated perforation and adjacent abscess and phlegmon formation with enlarged adjacent lymph nodes as well as a new 2 cm mass in the right hepatic lobe concerning for metastatic disease to the liver and pelvic congestion syndrome.  On re-evaluation, patient remains stable with normal vital signs.  Discussed with Erin, pharmacist, given concern for possible immunocompromise state with high risk diverticulitis on antibiotics.  She recommends starting with Zosyn as opposed to meropenem on order set as there is no concern the patient has a history of ESBL infections.  Blood cultures x2 initiated prior to antibiotics.  Consulted general surgery.  Dr. Kae Heller has evaluated the patient at bedside.  She recommends  medical admission and treating diverticulitis with contained perforation with fluid resuscitation, empiric broad-spectrum antibiotics, pain control and initiating work-up with CEA and liver mass and biopsy.  Initially recommends nonoperative treatment, but she may require drain placement eventually for abscess formation or operative intervention if she ultimately has a malignancy.   I had a long conversation at bedside with the patient regarding concerns as noted on CT discussed by radiology.  She gave me permission to call and update her mother.  Patient's mother is now at bedside.  Patient is critically ill with high risk of clinical decompensation and will require admission for further work-up and evaluation.  Consult to the hospitalist team and Dr. Cyd Silence will accept the patient for admission. The patient appears reasonably stabilized for admission considering the current resources, flow, and capabilities available in the ED at this time, and I doubt any other [facility] requiring further screening and/or treatment in the ED prior to admission.   Final Clinical Impression(s) / ED Diagnoses Final diagnoses:  Diverticulitis of large intestine with perforation and abscess, unspecified bleeding status  Mass of colon  Liver mass    Rx / DC Orders ED Discharge Orders    None       Chantele Corado A, PA-C 03/12/20 0844    Molpus, John, MD 03/13/20 0126

## 2020-03-12 NOTE — Progress Notes (Signed)
Pharmacy Antibiotic Note  Stephanie [name] is a 39 y.o. female admitted on 03/11/2020 with  sigmoid colon perforation.  Pharmacy has been consulted for zosyn dosing.  Plan: Zosyn 3.375gm IV q8h (4hr extended infusions) No dose adjustments needed, pharmacy will sign-off  Height: 5\' 9"  (175.3 cm) Weight: 74.8 kg (165 lb) IBW/kg (Calculated) : 66.2  Temp (24hrs), Avg:99.4 F (37.4 C), Min:99.4 F (37.4 C), Max:99.4 F (37.4 C)  Recent Labs  Lab 03/11/20 2135 03/12/20 0340  WBC 10.5  --   CREATININE 0.72  --   LATICACIDVEN  --  0.6    Estimated Creatinine Clearance: 98.7 mL/min (by C-G formula based on SCr of 0.72 mg/dL).    Allergies  Allergen Reactions  . Erythromycin     Antimicrobials this admission: 6/14 Zosyn >>  Dose adjustments this admission: none  Microbiology results: 6/14 BCx: 6/14 COVID:  Thank you for allowing pharmacy to be a part of this patient's care.  Peggyann Juba, PharmD, BCPS Pharmacy: [phone] 03/12/2020 6:08 AM

## 2020-03-12 NOTE — H&P (Signed)
History and Physical    Stephanie [name] ASN:[id number] DOB: 1981-08-02 DOA: 03/11/2020  PCP: Patient, [name]  Patient coming from: Home   Chief Complaint:  Chief Complaint  Patient presents with  . Abdominal Cramping     HPI:    39 year old female with no significant past medical history with exception of lactose intolerance who presents to [facility] emergency department with a 4-day history of abdominal pain.    Patient explains that for the past several weeks she has been experiencing fatigue.  The fatigue has been mild to moderate intensity and gradually worsening.  Additionally, over the span of time, patient has occasionally developed night sweats and episodes of low back pain.  This past Wednesday, the patient began to develop abdominal pain.  This abdominal pain is located in the lower abdomen, radiates diffusely, waxes and wanes in intensity, is sharp in quality and severe in intensity.  In the days that followed, patient has been developing associated intense bouts of abdominal cramping as well as experiencing small amounts of bright red blood per rectum with bowel movements.  Patient denies dysuria, fever, recent ingestion of undercooked food, sick contacts, nausea, vomiting.  Patient denies alcohol use.  Patient symptoms continue to worsen until she eventually presented to [facility] emergency department for evaluation.  Upon evaluation in the emergency department, patient underwent CT imaging of the abdomen and pelvis which revealed wall thickening of the sigmoid colon with pockets of extraluminal gas and if fluid collection concerning for abscess formation with enlarged lymph nodes as well as an additional phlegmonous collection abutting the sigmoid colon measuring 6.7 x 3.3 cm.  These findings are concerning for colorectal carcinoma.  Case was discussed with Dr. Windle Guard with general surgery who has evaluated the patient and written a consultation note.   The hospitalist group has now been called to assess the patient for admission the hospital.  Review of Systems: A 10-system review of systems has been performed and all systems are negative with the exception of what is listed in the HPI.    Past Medical History:  Diagnosis Date  . BV (bacterial vaginosis)   . Lactose intolerance 03/12/2020  . UTI (lower urinary tract infection)     Past Surgical History:  Procedure Laterality Date  . CESAREAN SECTION       reports that she has quit smoking. Her smoking use included cigarettes. She has a 5.00 pack-year smoking history. She has never used smokeless tobacco. She reports current alcohol use. She reports that she does not use drugs.  Allergies  Allergen Reactions  . Erythromycin     Family History  Problem Relation Age of Onset  . Heart disease Father   . Colon cancer Neg Hx   . Esophageal cancer Neg Hx      Prior to Admission medications   Medication Sig Start Date End Date Taking? Authorizing Provider  busPIRone (BUSPAR) 10 MG tablet Take 10 mg by mouth daily as needed (anxiety).   Yes [provider]  ibuprofen (ADVIL) 200 MG tablet Take 200 mg by mouth every 6 (six) hours as needed for headache or moderate pain.   Yes [provider]  ciprofloxacin (CIPRO) 500 MG tablet Take 1 tablet (500 mg total) by mouth 2 (two) times daily. Patient not taking: Reported on 03/12/2020 09/01/18   Robyn Haber, MD  metroNIDAZOLE (FLAGYL) 500 MG tablet Take 1 tablet (500 mg total) by mouth 2 (two) times daily. Patient not taking:  Reported on 03/12/2020 09/06/18   Raylene Everts, MD  PARAGARD INTRAUTERINE COPPER IU 1 Device by Intrauterine route once.     [provider]    Physical Exam: Vitals:   03/11/20 2103 03/12/20 0153 03/12/20 0322 03/12/20 0547  BP: 118/70 101/61 126/69 124/70  Pulse: (!) 116 83 100 80  Resp: 20 17 17 16   Temp: 99.4 F (37.4 C)     TempSrc: Oral     SpO2: 99% 98% 99% 100%    Weight: 74.8 kg     Height: 5\' 9"  (1.753 m)       Constitutional: Acute alert and oriented x3, in mild distress due to abdominal pain. Skin: no rashes, no lesions, good skin turgor noted. Eyes: Pupils are equally reactive to light.  No evidence of scleral icterus or conjunctival pallor.  ENMT: Moist mucous membranes noted.  Posterior pharynx clear of any exudate or lesions.   Neck: normal, supple, no masses, no thyromegaly.  No evidence of jugular venous distension.   Respiratory: clear to auscultation bilaterally, no wheezing, no crackles. Normal respiratory effort. No accessory muscle use.  Cardiovascular: Regular rate and rhythm, no murmurs / rubs / gallops. No extremity edema. 2+ pedal pulses. No carotid bruits.  Chest:   Nontender without crepitus or deformity.   Back:   Nontender without crepitus or deformity. Abdomen: Severe diffuse abdominal tenderness, worst in the bilateral lower quadrants.  Abdomen is relatively soft however with a relative firmness noted of the bilateral lower quadrants.  Hypoactive bowel sounds noted. Musculoskeletal: No joint deformity upper and lower extremities. Good ROM, no contractures. Normal muscle tone.  Neurologic: CN 2-12 grossly intact. Sensation intact, strength noted to be 5 out of 5 in all 4 extremities.  Patient is following all commands.  Patient is responsive to verbal stimuli.   Psychiatric: Patient presents as a tearful mood with appropriate affect..  Patient seems to possess insight as to theircurrent situation.     Labs on Admission: I have personally reviewed following labs and imaging studies -   CBC: Recent Labs  Lab 03/11/20 2135  WBC 10.5  HGB 9.3*  HCT 30.8*  MCV 78.6*  PLT 161   Basic Metabolic Panel: Recent Labs  Lab 03/11/20 2135  NA 139  K 4.0  CL 102  CO2 25  GLUCOSE 92  BUN 10  CREATININE 0.72  CALCIUM 8.6*   GFR: Estimated Creatinine Clearance: 98.7 mL/min (by C-G formula based on SCr of 0.72 mg/dL). Liver  Function Tests: Recent Labs  Lab 03/11/20 2135  AST 13*  ALT 9  ALKPHOS 48  BILITOT 0.6  PROT 7.0  ALBUMIN 3.4*   Recent Labs  Lab 03/11/20 2135  LIPASE 24   No results for input(s): AMMONIA in the last 168 hours. Coagulation Profile: No results for input(s): INR, PROTIME in the last 168 hours. Cardiac Enzymes: No results for input(s): CKTOTAL, CKMB, CKMBINDEX, TROPONINI in the last 168 hours. BNP (last 3 results) No results for input(s): PROBNP in the last 8760 hours. HbA1C: No results for input(s): HGBA1C in the last 72 hours. CBG: No results for input(s): GLUCAP in the last 168 hours. Lipid Profile: No results for input(s): CHOL, HDL, LDLCALC, TRIG, CHOLHDL, LDLDIRECT in the last 72 hours. Thyroid Function Tests: No results for input(s): TSH, T4TOTAL, FREET4, T3FREE, THYROIDAB in the last 72 hours. Anemia Panel: No results for input(s): VITAMINB12, FOLATE, FERRITIN, TIBC, IRON, RETICCTPCT in the last 72 hours. Urine analysis:    Component Value  Date/Time   COLORURINE YELLOW 03/12/2020 0037   APPEARANCEUR CLEAR 03/12/2020 0037   LABSPEC 1.015 03/12/2020 0037   PHURINE 6.0 03/12/2020 0037   GLUCOSEU NEGATIVE 03/12/2020 0037   HGBUR SMALL (A) 03/12/2020 0037   BILIRUBINUR NEGATIVE 03/12/2020 0037   KETONESUR NEGATIVE 03/12/2020 0037   PROTEINUR NEGATIVE 03/12/2020 0037   UROBILINOGEN 0.2 09/05/2018 1453   NITRITE NEGATIVE 03/12/2020 0037   LEUKOCYTESUR MODERATE (A) 03/12/2020 0037    Radiological Exams on Admission - Personally Reviewed: CT ABDOMEN PELVIS W CONTRAST  Result Date: 03/12/2020 CLINICAL DATA:  Abdominal pain.  Concern for diverticulitis. EXAM: CT ABDOMEN AND PELVIS WITH CONTRAST TECHNIQUE: Multidetector CT imaging of the abdomen and pelvis was performed using the standard protocol following bolus administration of intravenous contrast. CONTRAST:  150mL OMNIPAQUE IOHEXOL 300 MG/ML  SOLN COMPARISON:  CT dated 07/15/2018. FINDINGS: Lower chest: The lung  bases are clear. The heart size is normal. Hepatobiliary: There is a new 2.5 cm mass in the right hepatic lobe (axial series 2, image 17). Normal gallbladder.There is no biliary ductal dilation. Pancreas: Normal contours without ductal dilatation. No peripancreatic fluid collection. Spleen: Unremarkable. Adrenals/Urinary Tract: --Adrenal glands: Unremarkable. --Right kidney/ureter: No hydronephrosis or radiopaque kidney stones. --Left kidney/ureter: No hydronephrosis or radiopaque kidney stones. --Urinary bladder: Unremarkable. Stomach/Bowel: --Stomach/Duodenum: No hiatal hernia or other gastric abnormality. Normal duodenal course and caliber. --Small bowel: Unremarkable. --Colon: There is a large amount of stool in the colon. There is extensive wall thickening of the sigmoid colon with adjacent inflammatory changes. There are apparent pockets of extraluminal gas and a possible extraluminal air and fluid collection concerning for an abscess. This is best appreciated on the coronal series. There are enlarged adjacent lymph nodes. There is suggestion of a phlegmonous collection abutting the sigmoid colon and uterus that has not yet coalesced into an abscess. This collection measures approximately 6.7 x 3.3 cm. --Appendix: Normal. Vascular/Lymphatic: Normal course and caliber of the major abdominal vessels. --No retroperitoneal lymphadenopathy. --there are multiple enlarged lymph nodes adjacent to the presumed sigmoid mass. There are enlarged superior rectal lymph nodes. --there are no enlarged inguinal lymph nodes. Reproductive: An IUD is in place. There are enlarged pelvic veins which can be seen in patients with pelvic congestion syndrome. Other: There is a small amount of free fluid in the patient's pelvis. There is a small fat containing umbilical hernia. Musculoskeletal. No acute displaced fractures. IMPRESSION: 1. Overall findings are highly concerning for colorectal carcinoma involving the sigmoid colon with an  associated perforation and adjacent abscess and phlegmon formation as detailed above. Currently, no collection is amenable to percutaneous drainage given their small size and location. 2. New 2 cm mass in the right hepatic lobe concerning for metastatic disease to the liver until proven otherwise. 3. Enlarged regional lymph nodes as detailed above is concerning for nodal metastatic disease. 4. Large stool burden. 5. Prominent pelvic veins which can be seen in patients with pelvic congestion syndrome. These results were called by telephone at the time of interpretation on 03/12/2020 at 2:35 am to provider MIA [facility] , who verbally acknowledged these results. Electronically Signed   By: Constance Holster M.D.   On: 03/12/2020 02:40    Assessment/Plan Principal Problem:   Peritonitis with abscess of intestine [facility])   Patient has CT findings concerning for colorectal carcinoma with findings on CT concerning for perforation with both abscess and phlegmon formation.  Dr. Windle Guard with general surgery has already been consulted who is evaluated the patient and recommended initial  treatment of the area of intestinal perforation with intravenous antibiotics, fluids and close clinical monitoring.  If patient clinically worsens during this hospitalization, patient may need to undergo sigmoid resection during this hospitalization.   Patient's been placed on intravenous Zosyn  Hydrating patient with intravenous lactated Ringer solution  Blood cultures have been obtained.  As needed opiate-based analgesics for substantial associated pain  Active Problems:   Colorectal carcinoma ([place])  CT findings concerning for colorectal carcinoma with 2 cm liver mass concerning for metastatic lesion.  While we are treating suspected peritonitis with abscess and phlegmon formation with intravenous antibiotics and fluids, Dr. Windle Guard with general surgery recommends IR guided biopsy of the 2 cm liver mass.  As patient  clinically improves and we obtain the results of this biopsy, an elective sigmoid resection can then be arranged.  If patient instead clinically deteriorates, patient will need to proceed with resection during this hospitalization.  We will arrange for involvement of oncology once tissue diagnosis is made.    Microcytic anemia  Iron panel ordered  Monitoring hemoglobin and hematocrit with serial CBCs.    Code Status:  Full code Family Communication: Mother has been updated on plan of care at the bedside  Status is: Inpatient  Remains inpatient appropriate because:Ongoing active pain requiring inpatient pain management and IV treatments appropriate due to intensity of illness or inability to take PO   Dispo: The patient is from: Home              Anticipated d/c is to: Home              Anticipated d/c date is: > 3 days              Patient currently is not medically stable to d/c.        Vernelle Emerald MD [facility] Pager [phone]  If 7PM-7AM, please contact night-coverage [email or web address] Use universal [place] password for that web site. If you do not have the password, please call the hospital operator.  03/12/2020, 6:02 AM

## 2020-03-13 ENCOUNTER — Inpatient Hospital Stay (HOSPITAL_COMMUNITY): Payer: Medicaid Other

## 2020-03-13 LAB — COMPREHENSIVE METABOLIC PANEL
ALT: 11 U/L (ref 0–44)
AST: 9 U/L — ABNORMAL LOW (ref 15–41)
Albumin: 2.8 g/dL — ABNORMAL LOW (ref 3.5–5.0)
Alkaline Phosphatase: 45 U/L (ref 38–126)
Anion gap: 9 (ref 5–15)
BUN: 6 mg/dL (ref 6–20)
CO2: 25 mmol/L (ref 22–32)
Calcium: 7.9 mg/dL — ABNORMAL LOW (ref 8.9–10.3)
Chloride: 103 mmol/L (ref 98–111)
Creatinine, Ser: 0.55 mg/dL (ref 0.44–1.00)
GFR calc Af Amer: >60 mL/min (ref >60)
GFR calc non Af Amer: >60 mL/min (ref >60)
Glucose, Bld: 100 mg/dL — ABNORMAL HIGH (ref 70–99)
Potassium: 3.1 mmol/L — ABNORMAL LOW (ref 3.5–5.1)
Sodium: 137 mmol/L (ref 135–145)
Total Bilirubin: 1 mg/dL (ref 0.3–1.2)
Total Protein: 5.8 g/dL — ABNORMAL LOW (ref 6.5–8.1)

## 2020-03-13 LAB — CBC WITH DIFFERENTIAL/PLATELET
Abs Immature Granulocytes: 0.09 10*3/uL — ABNORMAL HIGH (ref 0.00–0.07)
Basophils Absolute: 0 10*3/uL (ref 0.0–0.1)
Basophils Relative: 0 %
Eosinophils Absolute: 0 10*3/uL (ref 0.0–0.5)
Eosinophils Relative: 0 %
HCT: 28 % — ABNORMAL LOW (ref 36.0–46.0)
Hemoglobin: 8.6 g/dL — ABNORMAL LOW (ref 12.0–15.0)
Immature Granulocytes: 1 %
Lymphocytes Relative: 10 %
Lymphs Abs: 1.3 10*3/uL (ref 0.7–4.0)
MCH: 23.7 pg — ABNORMAL LOW (ref 26.0–34.0)
MCHC: 30.7 g/dL (ref 30.0–36.0)
MCV: 77.1 fL — ABNORMAL LOW (ref 80.0–100.0)
Monocytes Absolute: 1.1 10*3/uL — ABNORMAL HIGH (ref 0.1–1.0)
Monocytes Relative: 8 %
Neutro Abs: 10.3 10*3/uL — ABNORMAL HIGH (ref 1.7–7.7)
Neutrophils Relative %: 81 %
Platelets: 186 10*3/uL (ref 150–400)
RBC: 3.63 MIL/uL — ABNORMAL LOW (ref 3.87–5.11)
RDW: 16.4 % — ABNORMAL HIGH (ref 11.5–15.5)
WBC: 12.8 10*3/uL — ABNORMAL HIGH (ref 4.0–10.5)
nRBC: 0 % (ref 0.0–0.2)

## 2020-03-13 LAB — CEA: CEA: 4.9 ng/mL — ABNORMAL HIGH (ref 0.0–4.7)

## 2020-03-13 LAB — MAGNESIUM: Magnesium: 1.9 mg/dL (ref 1.7–2.4)

## 2020-03-13 IMAGING — MR MR ABDOMEN WO/W CM
19 series · 48 of 48 positions shown · IV contrast (gadavist)
Comparison: No prior abdominal MRI. CT the abdomen and pelvis
[DATE].

CLINICAL DATA: 38-year-old female with history of liver lesion
noted on prior CT examination. Follow-up study.

EXAM:
MRI ABDOMEN WITHOUT AND WITH CONTRAST
TECHNIQUE: Multiplanar multisequence MR imaging of the abdomen was performed
both before and after the administration of intravenous contrast.
CONTRAST:  7mL GADAVIST GADOBUTROL 1 MMOL/ML IV SOLN

[Series 3: T2 · coronal · 6.0mm · 1.17mm/px · 2 of 30 slices shown (1 of 2)]
[im 1/30]
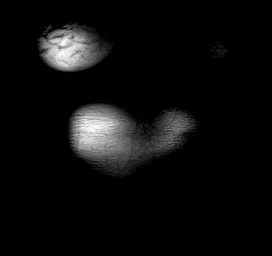
[im 30/30]
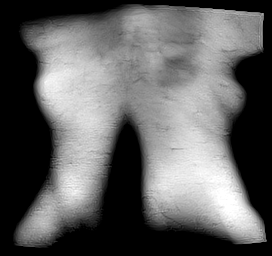

[Series 5: T2 fat-sat · axial · 6.0mm · 1.02mm/px · 1 of 39 slices shown]
[im 1/39]
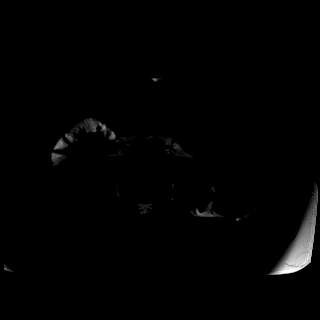

[Series 6: T1 · axial · 3.0mm · 1.02mm/px · z∈[-76,+184]mm · 3 of 88 slices shown (1 of 2)]
[im 1/88]
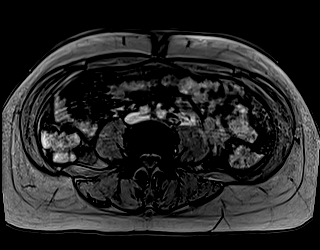
[im 44/88]
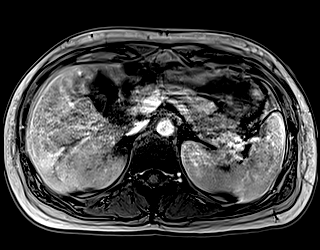
[im 88/88]
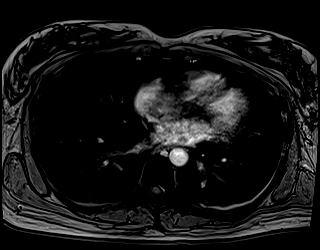

[Series 7: T1 · axial · 3.0mm · 1.02mm/px · z∈[-76,+184]mm · 3 of 88 slices shown (2 of 2)]
[im 1/88]
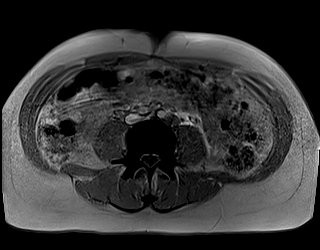
[im 44/88]
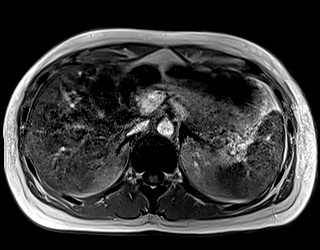
[im 88/88]
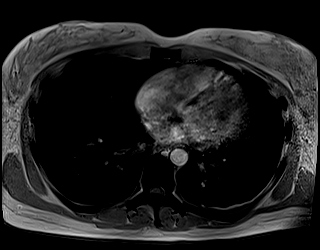

[Series 8: DWI · axial · 6.0mm · 1.36mm/px · z∈[-84,+182]mm · 3 of 76 slices shown (1 of 2)]
[im 1/76]
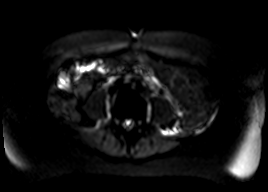
[im 38/76]
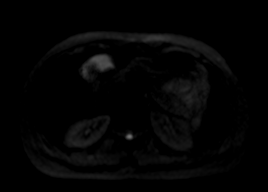
[im 76/76]
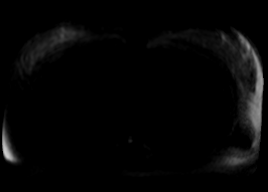

[Series 9: DWI · axial · 6.0mm · 1.36mm/px · 1 of 38 slices shown (2 of 2)]
[im 1/38]
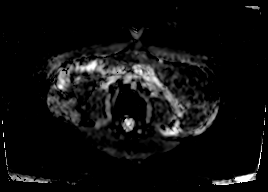

[Series 10: bSSFP · axial · 4.0mm · 0.63mm/px · z∈[-79,+185]mm · 2 of 67 slices shown]
[im 1/67]
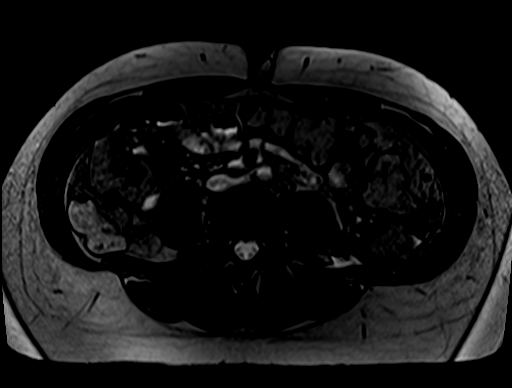
[im 67/67]
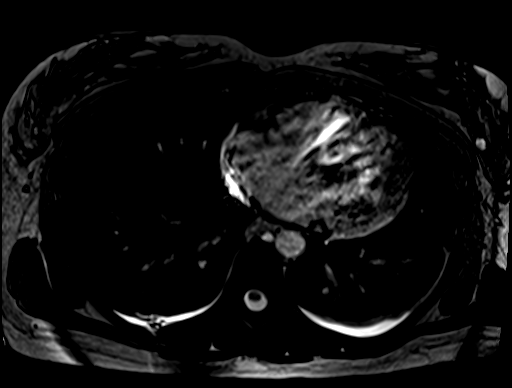

[Series 12: T1 dynamic · axial · 3.0mm · 1.02mm/px · z∈[-69,+192]mm · 3 of 88 slices shown (1 of 6)]
[im 1/88]
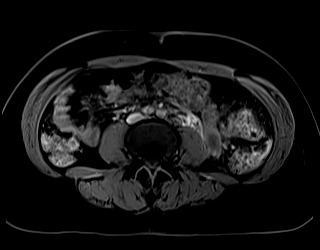
[im 44/88]
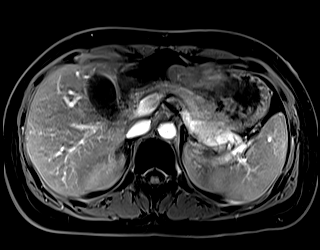
[im 88/88]
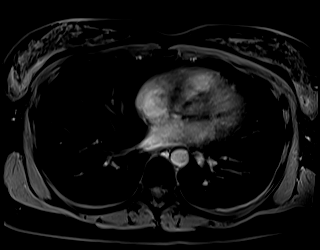

[Series 15: T1 dynamic · axial · 3.0mm · 1.02mm/px · z∈[-69,+192]mm · 3 of 88 slices shown (2 of 6)]
[im 1/88]
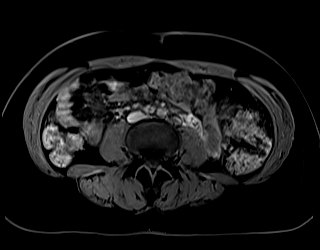
[im 44/88]
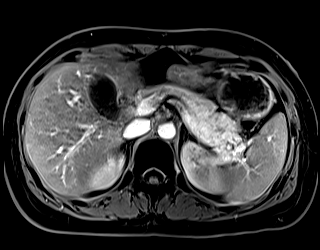
[im 88/88]
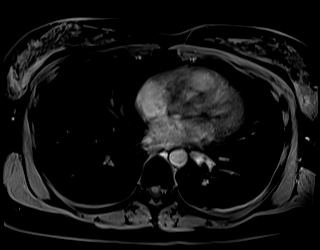

[Series 17: T1 dynamic · axial · 3.0mm · 1.02mm/px · z∈[-69,+192]mm · 3 of 88 slices shown (3 of 6)]
[im 1/88]
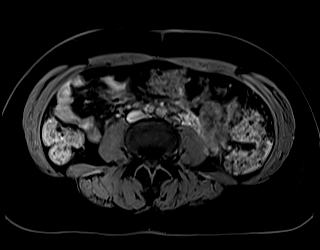
[im 44/88]
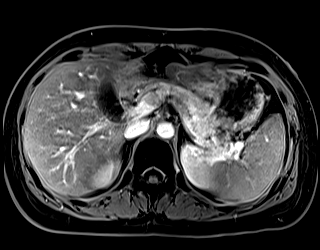
[im 88/88]
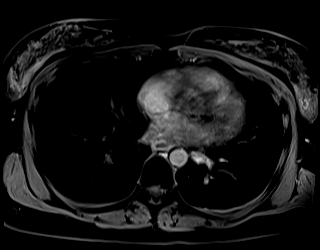

[Series 19: T1 dynamic · axial · 3.0mm · 1.02mm/px · z∈[-69,+192]mm · 3 of 88 slices shown (4 of 6)]
[im 1/88]
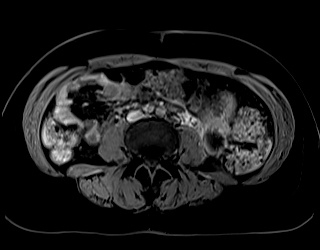
[im 44/88]
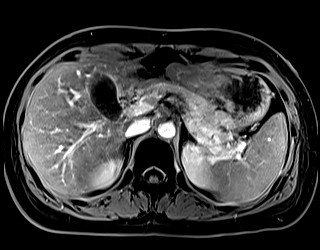
[im 88/88]
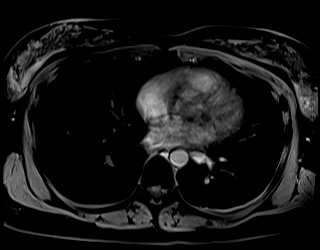

[Series 21: T1 dynamic · coronal · 3.0mm · 1.02mm/px · 2 of 72 slices shown (5 of 6)]
[im 1/72]
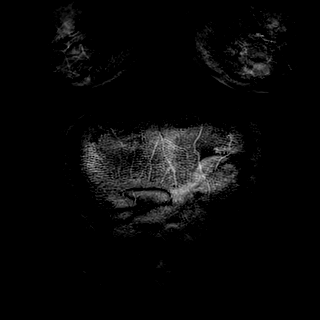
[im 72/72]
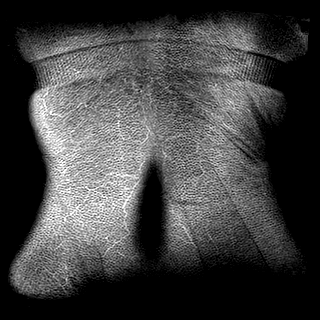

[Series 22: T2 · axial · 6.0mm · 1.27mm/px · 1 of 39 slices shown (2 of 2)]
[im 1/39]
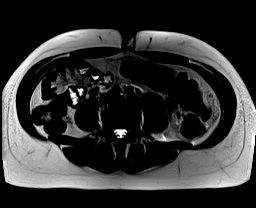

[Series 24: T1 dynamic · axial · 3.0mm · 1.02mm/px · z∈[-69,+192]mm · 3 of 88 slices shown (6 of 6)]
[im 1/88]
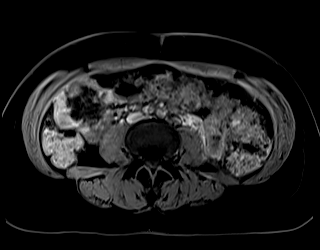
[im 44/88]
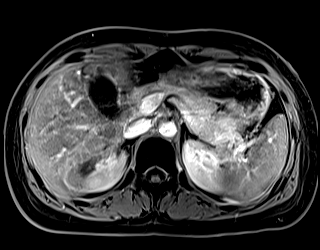
[im 88/88]
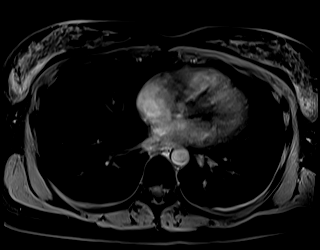

[Series 100: arterial sub · axial · arterial · 3.0mm · 1.02mm/px · z∈[-69,+192]mm · 3 of 87 slices shown]
[im 1/87]
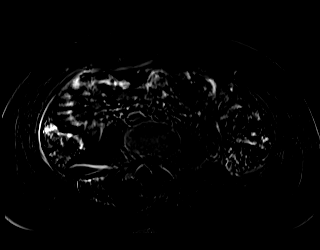
[im 44/87]
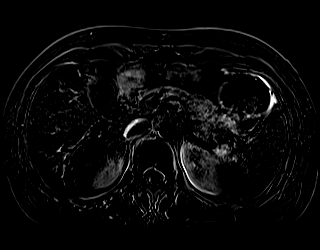
[im 87/87]
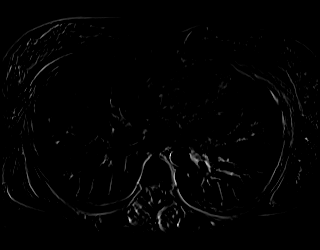

[Series 101: sub_arterial · axial · 3.0mm · 1.02mm/px · z∈[-69,+192]mm · 3 of 87 slices shown]
[im 1/87]
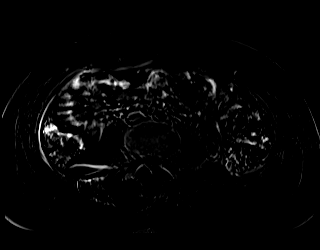
[im 44/87]
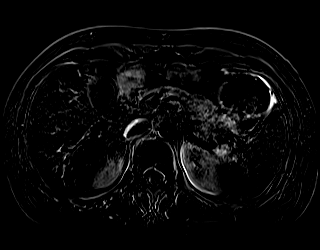
[im 87/87]
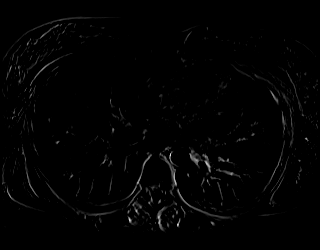

[Series 102: sub_45 sec · axial · 3.0mm · 1.02mm/px · z∈[-69,+192]mm · 3 of 88 slices shown]
[im 1/88]
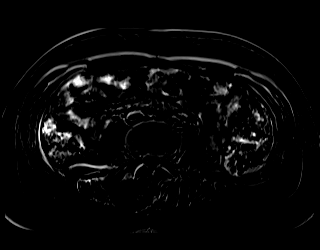
[im 44/88]
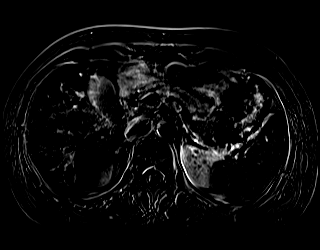
[im 88/88]
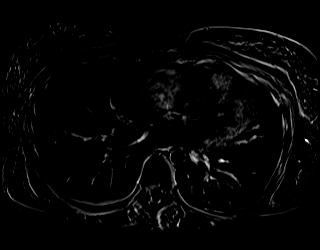

[Series 103: sub_90 sec · axial · 3.0mm · 1.02mm/px · z∈[-69,+192]mm · 3 of 87 slices shown]
[im 1/87]
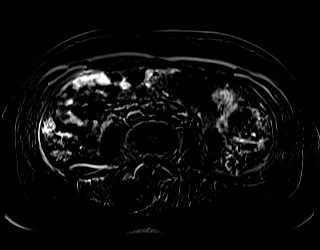
[im 44/87]
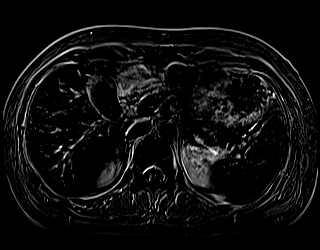
[im 87/87]
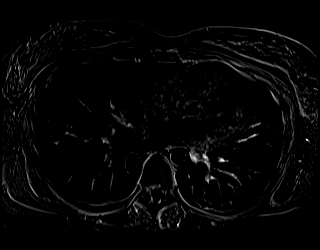

[Series 104: sub_delay · axial · 3.0mm · 1.02mm/px · z∈[-69,+192]mm · 3 of 88 slices shown]
[im 1/88]
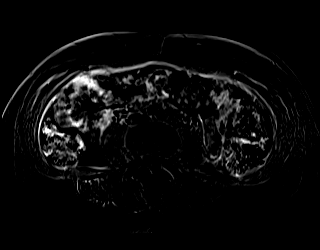
[im 44/88]
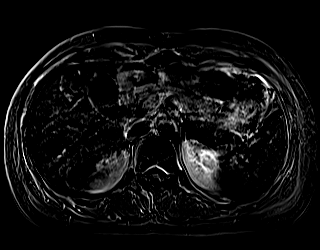
[im 88/88]
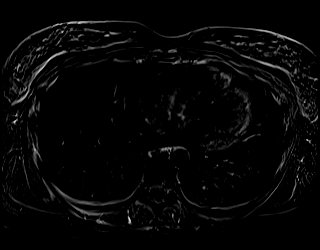

[48 of 48 positions shown; findings below may reference images not displayed]

FINDINGS: Comment: Today's study is limited by recent administration of
Feraheme on [DATE]. This grossly alters the signal
characteristics of both the liver and splenic parenchyma, and
completely compromises interpretation of signal intensity on post
gadolinium imaging.

Lower chest: Unremarkable.

Hepatobiliary: Diffuse low signal intensity throughout the hepatic
parenchyma on T2 weighted images, presumably a consequence of recent
Feraheme injection. In segment 7 of the liver (axial image 8 of
series 5) there is a 2.5 x 1.9 cm well-defined lesion which is
slightly T2 hyperintense. This lesion appears hyperintense on pre
gadolinium T1 weighted images (likely a consequence of Feraheme).
Interpretation of enhancement within the lesion is compromised by
presence of Feraheme. No other hepatic lesions are confidently
identified on today's examination. No intra or extrahepatic biliary
ductal dilatation. Gallbladder is normal in appearance.

Pancreas: No pancreatic mass or peripancreatic fluid collections or
inflammatory changes.

Spleen: Diffuse low signal intensity throughout the spleen on T2
weighted images, presumably consequence of Feraheme injection.

Adrenals/Urinary Tract: Bilateral kidneys and adrenal glands are
normal in appearance. No hydroureteronephrosis in the visualized
portions of the abdomen.

Stomach/Bowel: Unremarkable.

Vascular/Lymphatic: No aneurysm identified in the visualized
abdominal vasculature. No lymphadenopathy noted in the abdomen.

Other: No significant volume of ascites noted in the visualized
portions of the peritoneal cavity.

Musculoskeletal: Diffuse low signal intensity throughout the marrow
containing spaces, presumably a consequence of recent Feraheme
injection. No aggressive appearing osseous lesions are noted in the
visualized portions of the skeleton.
IMPRESSION: 1. Limited study secondary to recent Feraheme injection. The lesion
of concern in segment 7 of the liver has indeterminate imaging
characteristics on today's examination. Given the appearance on
prior ultrasound examination, this may simply represent a cavernous
hemangioma. However, repeat abdominal MRI with and without IV
gadolinium is recommended in 3 months (after clearance of Feraheme)
to re-evaluate this lesion, both to ensure stability and definitive
characterization.

## 2020-03-13 MED ORDER — MIDAZOLAM HCL 2 MG/2ML IJ SOLN
INTRAMUSCULAR | Status: DC | PRN
  Administered 2020-03-13: 1 mg via INTRAVENOUS

## 2020-03-13 MED ORDER — POTASSIUM CHLORIDE 10 MEQ/100ML IV SOLN
10.0000 meq | INTRAVENOUS | Status: AC
  Administered 2020-03-13 (×2): 10 meq via INTRAVENOUS
  Filled 2020-03-13: qty 100

## 2020-03-13 MED ORDER — FENTANYL CITRATE (PF) 100 MCG/2ML IJ SOLN
INTRAMUSCULAR | Status: DC | PRN
  Administered 2020-03-13: 50 ug via INTRAVENOUS

## 2020-03-13 MED ORDER — FENTANYL CITRATE (PF) 100 MCG/2ML IJ SOLN
INTRAMUSCULAR | Status: AC
  Filled 2020-03-13: qty 2

## 2020-03-13 MED ORDER — KETOROLAC TROMETHAMINE 30 MG/ML IJ SOLN
30.0000 mg | Freq: Once | INTRAMUSCULAR | Status: AC
  Administered 2020-03-13: 30 mg via INTRAVENOUS
  Filled 2020-03-13: qty 1

## 2020-03-13 MED ORDER — POLYETHYLENE GLYCOL 3350 17 G PO PACK
17.0000 g | PACK | Freq: Every day | ORAL | Status: DC
  Administered 2020-03-13 – 2020-03-16 (×4): 17 g via ORAL
  Filled 2020-03-13 (×4): qty 1

## 2020-03-13 MED ORDER — SODIUM CHLORIDE 0.9 % IV SOLN: INTRAVENOUS | Status: AC

## 2020-03-13 MED ORDER — LIDOCAINE HCL 1 % IJ SOLN
INTRAMUSCULAR | Status: AC
  Filled 2020-03-13: qty 20

## 2020-03-13 MED ORDER — POTASSIUM CHLORIDE 10 MEQ/100ML IV SOLN
10.0000 meq | INTRAVENOUS | Status: AC
  Administered 2020-03-13 (×4): 10 meq via INTRAVENOUS
  Filled 2020-03-13 (×4): qty 100

## 2020-03-13 MED ORDER — MIDAZOLAM HCL 2 MG/2ML IJ SOLN
INTRAMUSCULAR | Status: AC
  Filled 2020-03-13: qty 4

## 2020-03-13 MED ORDER — GADOBUTROL 1 MMOL/ML IV SOLN
7.0000 mL | Freq: Once | INTRAVENOUS | Status: AC | PRN
  Administered 2020-03-13: 7 mL via INTRAVENOUS

## 2020-03-13 MED ORDER — SODIUM CHLORIDE 0.9 % IV SOLN
INTRAVENOUS | Status: DC | PRN
  Administered 2020-03-13: 250 mL via INTRAVENOUS

## 2020-03-13 MED ORDER — GELATIN ABSORBABLE 12-7 MM EX MISC
CUTANEOUS | Status: AC
  Filled 2020-03-13: qty 1

## 2020-03-13 NOTE — Progress Notes (Signed)
Pt had a small bowel movement, said it looked like round balls.

## 2020-03-13 NOTE — Procedures (Signed)
Interventional Radiology Procedure Note  Procedure: Planned biopsy was deferred given [place] appearance of the lesion which appears more consistent with a hemangioma.  Recommend MRI abdomen with contrast prior to biopsy.   Complications: None  Estimated Blood Loss: None  Recommendations: - Return to room - MRI abdomen with contrast to evaluate for liver hemangioma.  If MRI is indeterminate or remains suspicious for met, then biopsy can be re-considered.   Signed,  Criselda Peaches, MD

## 2020-03-13 NOTE — Progress Notes (Signed)
PROGRESS NOTE    Stephanie [name]  LHT:[id number] DOB: Aug 07, 1981 DOA: 03/11/2020 PCP: Patient, [name]   Brief Narrative:  39 year old female with no known past medical history presented to the hospital with complaints of abdominal pain.  CT scan showed concerns of rectal carcinoma with some perforation, 2 cm right hepatic lobe liver lesion and large stool burden.  Attempts of ultrasound-guided biopsy of liver lesion was performed but ultrasound showed concerns of possible hemangioma therefore MRI abdomen was ordered.  CEA levels were very minimally elevated.   Assessment & Plan:   Principal Problem:   Peritonitis with abscess of intestine (HCC) Active Problems:   Microcytic anemia   Colorectal carcinoma (HCC)  Abdominal pain concerns for intra-abdominal infection versus peritonitis -Continue IV antibiotics-Zosyn, supportive care, bowel regimen, p.o. intake as tolerated.  Patient will eventually need colonoscopy.  Once MRI abdomen is performed, will consult GI in case if we need to obtain sigmoid colon mass biopsy  Right hepatic lobe liver lesion -Initially thought to be a liver mass but ultrasound concerning for hemangioma.  MRI with contrast ordered  Hyperkalemia -Repletion ordered  Iron deficiency/microcytic anemia -IV iron 6/14, p.o. iron with bowel regimen  DVT prophylaxis: SCDs Start: 03/12/20 0603 Code Status: Full code Family Communication: None  Status is: Inpatient  Remains inpatient appropriate because:Inpatient level of care appropriate due to severity of illness   Dispo: The patient is from: Home              Anticipated d/c is to: Home              Anticipated d/c date is: 1--2 days              Patient currently is not medically stable to d/c. Ongoing evaluation for abdominal pain and rectal mass.  Currently on clear liquid diet.  Getting IV fluids.    Subjective: Lower abdominal pain is slightly better this morning.  No new complaints.  Review  of Systems Otherwise negative except as per HPI, including: General: Denies fever, chills, night sweats or unintended weight loss. Resp: Denies cough, wheezing, shortness of breath. Cardiac: Denies chest pain, palpitations, orthopnea, paroxysmal nocturnal dyspnea. GI: Denies abdominal pain, nausea, vomiting, diarrhea or constipation GU: Denies dysuria, frequency, hesitancy or incontinence MS: Denies muscle aches, joint pain or swelling Neuro: Denies headache, neurologic deficits (focal weakness, numbness, tingling), abnormal gait Psych: Denies anxiety, depression, SI/HI/AVH Skin: Denies new rashes or lesions ID: Denies sick contacts, exotic exposures, travel  Examination:  General exam: Appears calm and comfortable  Respiratory system: Clear to auscultation. Respiratory effort normal. Cardiovascular system: S1 & S2 heard, RRR. No JVD, murmurs, rubs, gallops or clicks. No pedal edema. Gastrointestinal system: Abdomen is nondistended, soft and nontender. No organomegaly or masses felt. Normal bowel sounds heard. Central nervous system: Alert and oriented. No focal neurological deficits. Extremities: Symmetric 5 x 5 power. Skin: No rashes, lesions or ulcers Psychiatry: Judgement and insight appear normal. Mood & affect appropriate.     Objective: Vitals:   03/12/20 2154 03/13/20 0120 03/13/20 0613 03/13/20 0905  BP: 116/60 (!) 108/59 113/60 134/68  Pulse: 88 87 84 82  Resp: 18 14 16 16   Temp: 99.3 F (37.4 C) 98.6 F (37 C) (!) 97.5 F (36.4 C)   TempSrc: Oral Oral Oral   SpO2: 98% 96% 98% 100%  Weight:      Height:        Intake/Output Summary (Last 24 hours) at 03/13/2020 1105 Last data  filed at 03/13/2020 0615 Gross per 24 hour  Intake 2305.97 ml  Output 0 ml  Net 2305.97 ml   Filed Weights   03/11/20 2103  Weight: 74.8 kg     Data Reviewed:   CBC: Recent Labs  Lab 03/11/20 2135 03/12/20 0615 03/13/20 0456  WBC 10.5 10.6* 12.8*  NEUTROABS  --  7.8* 10.3*    HGB 9.3* 9.1* 8.6*  HCT 30.8* 30.5* 28.0*  MCV 78.6* 78.6* 77.1*  PLT 223 206 938   Basic Metabolic Panel: Recent Labs  Lab 03/11/20 2135 03/12/20 0615 03/13/20 0456  NA 139 140 137  K 4.0 3.5 3.1*  CL 102 106 103  CO2 25 24 25   GLUCOSE 92 104* 100*  BUN 10 6 6   CREATININE 0.72 0.65 0.55  CALCIUM 8.6* 7.8* 7.9*  MG  --   --  1.9   GFR: Estimated Creatinine Clearance: 98.7 mL/min (by C-G formula based on SCr of 0.55 mg/dL). Liver Function Tests: Recent Labs  Lab 03/11/20 2135 03/12/20 0615 03/13/20 0456  AST 13* 10* 9*  ALT 9 11 11   ALKPHOS 48 42 45  BILITOT 0.6 0.6 1.0  PROT 7.0 6.1* 5.8*  ALBUMIN 3.4* 3.0* 2.8*   Recent Labs  Lab 03/11/20 2135  LIPASE 24   No results for input(s): AMMONIA in the last 168 hours. Coagulation Profile: Recent Labs  Lab 03/12/20 0615  INR 1.1   Cardiac Enzymes: No results for input(s): CKTOTAL, CKMB, CKMBINDEX, TROPONINI in the last 168 hours. BNP (last 3 results) No results for input(s): PROBNP in the last 8760 hours. HbA1C: No results for input(s): HGBA1C in the last 72 hours. CBG: No results for input(s): GLUCAP in the last 168 hours. Lipid Profile: No results for input(s): CHOL, HDL, LDLCALC, TRIG, CHOLHDL, LDLDIRECT in the last 72 hours. Thyroid Function Tests: No results for input(s): TSH, T4TOTAL, FREET4, T3FREE, THYROIDAB in the last 72 hours. Anemia Panel: Recent Labs    03/12/20 0615  TIBC 301  IRON 8*   Sepsis Labs: Recent Labs  Lab 03/12/20 0340  LATICACIDVEN 0.6    Recent Results (from the past 240 hour(s))  Blood culture (routine x 2)     Status: None (Preliminary result)   Collection Time: 03/12/20  3:01 AM   Specimen: BLOOD RIGHT HAND  Result Value Ref Range Status   Specimen Description   Final    BLOOD RIGHT HAND Performed at [facility] [address] [place], [place] [address]    Special Requests   Final    BOTTLES DRAWN AEROBIC ONLY Blood Culture adequate  volume Performed at [facility] [address] [place], [place] [address]    Culture   Final    NO GROWTH < 12 HOURS Performed at [facility] [address] [place], [place] [address]    Report Status PENDING  Incomplete  Blood culture (routine x 2)     Status: None (Preliminary result)   Collection Time: 03/12/20  3:01 AM   Specimen: BLOOD  Result Value Ref Range Status   Specimen Description   Final    BLOOD LEFT ARM Performed at [facility] [address] [place], [place] [address]    Special Requests   Final    BOTTLES DRAWN AEROBIC AND ANAEROBIC Blood Culture adequate volume Performed at [facility] [address], [place], [place] [address]    Culture   Final    NO GROWTH <  12 HOURS Performed at [facility], [place] [address], [place], [place] [address]    Report Status PENDING  Incomplete  SARS Coronavirus 2 by RT PCR (hospital order, performed in [facility] hospital lab) Nasopharyngeal Nasopharyngeal Swab     Status: None   Collection Time: 03/12/20  3:32 AM   Specimen: Nasopharyngeal Swab  Result Value Ref Range Status   SARS Coronavirus 2 NEGATIVE NEGATIVE Final    Comment: (NOTE) SARS-CoV-2 target nucleic acids are NOT DETECTED.  The SARS-CoV-2 RNA is generally detectable in upper and lower respiratory specimens during the acute phase of infection. The lowest concentration of SARS-CoV-2 viral copies this assay can detect is 250 copies / mL. A negative result does not preclude SARS-CoV-2 infection and should not be used as the sole basis for treatment or other patient management decisions.  A negative result may occur with improper specimen collection / handling, submission of specimen other than nasopharyngeal swab, presence of viral mutation(s) within the areas targeted by this assay, and inadequate number of viral copies (<250 copies / mL). A negative result must be combined with  clinical observations, patient history, and epidemiological information.  Fact Sheet for Patients:   StrictlyIdeas.no  Fact Sheet for Healthcare Providers: BankingDealers.co.za  This test is not yet approved or  cleared by the [place] FDA and has been authorized for detection and/or diagnosis of SARS-CoV-2 by FDA under an Emergency Use Authorization (EUA).  This EUA will remain in effect (meaning this test can be used) for the duration of the COVID-19 declaration under Section 564(b)(1) of the Act, 21 U.S.C. section 360bbb-3(b)(1), unless the authorization is terminated or revoked sooner.  Performed at [facility], [place] [address], [place], [place] [address]          Radiology Studies: CT ABDOMEN PELVIS W CONTRAST  Result Date: 03/12/2020 CLINICAL DATA:  Abdominal pain.  Concern for diverticulitis. EXAM: CT ABDOMEN AND PELVIS WITH CONTRAST TECHNIQUE: Multidetector CT imaging of the abdomen and pelvis was performed using the standard protocol following bolus administration of intravenous contrast. CONTRAST:  190mL OMNIPAQUE IOHEXOL 300 MG/ML  SOLN COMPARISON:  CT dated 07/15/2018. FINDINGS: Lower chest: The lung bases are clear. The heart size is normal. Hepatobiliary: There is a new 2.5 cm mass in the right hepatic lobe (axial series 2, image 17). Normal gallbladder.There is no biliary ductal dilation. Pancreas: Normal contours without ductal dilatation. No peripancreatic fluid collection. Spleen: Unremarkable. Adrenals/Urinary Tract: --Adrenal glands: Unremarkable. --Right kidney/ureter: No hydronephrosis or radiopaque kidney stones. --Left kidney/ureter: No hydronephrosis or radiopaque kidney stones. --Urinary bladder: Unremarkable. Stomach/Bowel: --Stomach/Duodenum: No hiatal hernia or other gastric abnormality. Normal duodenal course and caliber. --Small bowel: Unremarkable. --Colon: There is a large amount of  stool in the colon. There is extensive wall thickening of the sigmoid colon with adjacent inflammatory changes. There are apparent pockets of extraluminal gas and a possible extraluminal air and fluid collection concerning for an abscess. This is best appreciated on the coronal series. There are enlarged adjacent lymph nodes. There is suggestion of a phlegmonous collection abutting the sigmoid colon and uterus that has not yet coalesced into an abscess. This collection measures approximately 6.7 x 3.3 cm. --Appendix: Normal. Vascular/Lymphatic: Normal course and caliber of the major abdominal vessels. --No retroperitoneal lymphadenopathy. --there are multiple enlarged lymph nodes adjacent to the presumed sigmoid mass. There are enlarged superior rectal lymph nodes. --there are no enlarged inguinal lymph nodes. Reproductive: An IUD is in place. There are enlarged pelvic  veins which can be seen in patients with pelvic congestion syndrome. Other: There is a small amount of free fluid in the patient's pelvis. There is a small fat containing umbilical hernia. Musculoskeletal. No acute displaced fractures. IMPRESSION: 1. Overall findings are highly concerning for colorectal carcinoma involving the sigmoid colon with an associated perforation and adjacent abscess and phlegmon formation as detailed above. Currently, no collection is amenable to percutaneous drainage given their small size and location. 2. New 2 cm mass in the right hepatic lobe concerning for metastatic disease to the liver until proven otherwise. 3. Enlarged regional lymph nodes as detailed above is concerning for nodal metastatic disease. 4. Large stool burden. 5. Prominent pelvic veins which can be seen in patients with pelvic congestion syndrome. These results were called by telephone at the time of interpretation on 03/12/2020 at 2:35 am to provider [facility]IA [facility] , who verbally acknowledged these results. Electronically Signed   By: Constance Holster  M.D.   On: 03/12/2020 02:40        Scheduled Meds: . fentaNYL      . ferrous sulfate  325 mg Oral BID WC  . lidocaine      . midazolam      . polyethylene glycol  17 g Oral Daily  . senna-docusate  2 tablet Oral BID   Continuous Infusions: . sodium chloride 75 mL/hr at 03/13/20 0759  . piperacillin-tazobactam (ZOSYN)  IV 3.375 g (03/13/20 0412)  . potassium chloride Stopped (03/13/20 0833)     LOS: 1 day   Time spent= 35 mins    Emmalin Jaquess Arsenio Loader, MD [facility]  If 7PM-7AM, please contact night-coverage  03/13/2020, 11:05 AM

## 2020-03-13 NOTE — Progress Notes (Signed)
Subjective: CC: Abdominal pain Patient back from IR. Case cancelled by RN as it looked to be hemangioma on [place]. They are obtaining MRI. Patient reports continued crampy pain in the lower abdomen L>R. No n/v. Passing some flatus. No BM.   Objective: Vital signs in last 24 hours: Temp:  [97.5 F (36.4 C)-99.9 F (37.7 C)] 97.5 F (36.4 C) (06/15 9390) Pulse Rate:  [76-88] 82 (06/15 0905) Resp:  [14-18] 16 (06/15 0905) BP: (108-134)/(58-73) 134/68 (06/15 0905) SpO2:  [96 %-100 %] 100 % (06/15 0905) Last BM Date: 03/11/20  Intake/Output from previous day: 06/14 0701 - 06/15 0700 In: 2306 [P.O.:540; I.V.:1591; IV Piggyback:175] Out: 0  Intake/Output this shift: No intake/output data recorded.  PE: Gen:  Alert, NAD, pleasant Lungs: Rate and effort normal  Abd: Soft, ND, tenderness of the lower abdomen L>R without peritonitis, +BS Psych: A&Ox3  Skin: no rashes noted, warm and dry  Lab Results:  Recent Labs    03/12/20 0615 03/13/20 0456  WBC 10.6* 12.8*  HGB 9.1* 8.6*  HCT 30.5* 28.0*  PLT 206 186   BMET Recent Labs    03/12/20 0615 03/13/20 0456  NA 140 137  K 3.5 3.1*  CL 106 103  CO2 24 25  GLUCOSE 104* 100*  BUN 6 6  CREATININE 0.65 0.55  CALCIUM 7.8* 7.9*   PT/INR Recent Labs    03/12/20 0615  LABPROT 13.8  INR 1.1   CMP     Component Value Date/Time   NA 137 03/13/2020 0456   K 3.1 (L) 03/13/2020 0456   CL 103 03/13/2020 0456   CO2 25 03/13/2020 0456   GLUCOSE 100 (H) 03/13/2020 0456   BUN 6 03/13/2020 0456   CREATININE 0.55 03/13/2020 0456   CALCIUM 7.9 (L) 03/13/2020 0456   PROT 5.8 (L) 03/13/2020 0456   ALBUMIN 2.8 (L) 03/13/2020 0456   AST 9 (L) 03/13/2020 0456   ALT 11 03/13/2020 0456   ALKPHOS 45 03/13/2020 0456   BILITOT 1.0 03/13/2020 0456   GFRNONAA >60 03/13/2020 0456   GFRAA >60 03/13/2020 0456   Lipase     Component Value Date/Time   LIPASE 24 03/11/2020 2135       Studies/Results: CT ABDOMEN PELVIS W  CONTRAST  Result Date: 03/12/2020 CLINICAL DATA:  Abdominal pain.  Concern for diverticulitis. EXAM: CT ABDOMEN AND PELVIS WITH CONTRAST TECHNIQUE: Multidetector CT imaging of the abdomen and pelvis was performed using the standard protocol following bolus administration of intravenous contrast. CONTRAST:  123mL OMNIPAQUE IOHEXOL 300 MG/ML  SOLN COMPARISON:  CT dated 07/15/2018. FINDINGS: Lower chest: The lung bases are clear. The heart size is normal. Hepatobiliary: There is a new 2.5 cm mass in the right hepatic lobe (axial series 2, image 17). Normal gallbladder.There is no biliary ductal dilation. Pancreas: Normal contours without ductal dilatation. No peripancreatic fluid collection. Spleen: Unremarkable. Adrenals/Urinary Tract: --Adrenal glands: Unremarkable. --Right kidney/ureter: No hydronephrosis or radiopaque kidney stones. --Left kidney/ureter: No hydronephrosis or radiopaque kidney stones. --Urinary bladder: Unremarkable. Stomach/Bowel: --Stomach/Duodenum: No hiatal hernia or other gastric abnormality. Normal duodenal course and caliber. --Small bowel: Unremarkable. --Colon: There is a large amount of stool in the colon. There is extensive wall thickening of the sigmoid colon with adjacent inflammatory changes. There are apparent pockets of extraluminal gas and a possible extraluminal air and fluid collection concerning for an abscess. This is best appreciated on the coronal series. There are enlarged adjacent lymph nodes. There is suggestion of a  phlegmonous collection abutting the sigmoid colon and uterus that has not yet coalesced into an abscess. This collection measures approximately 6.7 x 3.3 cm. --Appendix: Normal. Vascular/Lymphatic: Normal course and caliber of the major abdominal vessels. --No retroperitoneal lymphadenopathy. --there are multiple enlarged lymph nodes adjacent to the presumed sigmoid mass. There are enlarged superior rectal lymph nodes. --there are no enlarged inguinal lymph  nodes. Reproductive: An IUD is in place. There are enlarged pelvic veins which can be seen in patients with pelvic congestion syndrome. Other: There is a small amount of free fluid in the patient's pelvis. There is a small fat containing umbilical hernia. Musculoskeletal. No acute displaced fractures. IMPRESSION: 1. Overall findings are highly concerning for colorectal carcinoma involving the sigmoid colon with an associated perforation and adjacent abscess and phlegmon formation as detailed above. Currently, no collection is amenable to percutaneous drainage given their small size and location. 2. New 2 cm mass in the right hepatic lobe concerning for metastatic disease to the liver until proven otherwise. 3. Enlarged regional lymph nodes as detailed above is concerning for nodal metastatic disease. 4. Large stool burden. 5. Prominent pelvic veins which can be seen in patients with pelvic congestion syndrome. These results were called by telephone at the time of interpretation on 03/12/2020 at 2:35 am to provider MIA [facility] , who verbally acknowledged these results. Electronically Signed   By: Constance Holster M.D.   On: 03/12/2020 02:40    Anti-infectives: Anti-infectives (From admission, onward)   Start     Dose/Rate Route Frequency Ordered Stop   03/12/20 1200  piperacillin-tazobactam (ZOSYN) IVPB 3.375 g     Discontinue     3.375 g 12.5 mL/hr over 240 Minutes Intravenous Every 8 hours 03/12/20 0607     03/12/20 0300  piperacillin-tazobactam (ZOSYN) IVPB 3.375 g        3.375 g 100 mL/hr over 30 Minutes Intravenous  Once 03/12/20 0247 03/12/20 0525       Assessment/Plan Localized/ containedsigmoid colon perforation in the setting of phlegmon and extensive colonic inflammation 2 cm right hepatic lobe mass - With history of intermittent rectal bleeding and liver lesion, there is concern for malignancy. This could also be diverticulitis with contained perforation. [place] this AM looked like  hemangioma to IR. They are getting MRI for confirmation. Determination of primary process (diverticulitis vs malignancy) will help determine the patients hospital course - No indication for emergent surgery at the present  - Continue iv abx and bowel rest - CEA 4.9 - We will continue to follow along with you   FEN - NPO, IVF VTE - SCDs, okay for chemical prophylaxis from general surgery standpoint ID - Zosyn 6/14 >>   LOS: 1 day    Jillyn Ledger , [facility] 03/13/2020, 9:39 AM Please see Amion for pager number during day hours 7:00am-4:30pm

## 2020-03-13 NOTE — Sedation Documentation (Signed)
Case cancelled per Dr Laurence Ferrari

## 2020-03-13 NOTE — Progress Notes (Addendum)
Per patient request, CSW met with the patient at bedside to discuss financial assistance programs offered through [facility] for individuals that qualify/meet required criteria. Patient kindly requested CSW return later. TOC staff will follow up at a later time.

## 2020-03-14 LAB — COMPREHENSIVE METABOLIC PANEL
ALT: 11 U/L (ref 0–44)
AST: 10 U/L — ABNORMAL LOW (ref 15–41)
Albumin: 2.6 g/dL — ABNORMAL LOW (ref 3.5–5.0)
Alkaline Phosphatase: 46 U/L (ref 38–126)
Anion gap: 9 (ref 5–15)
BUN: 7 mg/dL (ref 6–20)
CO2: 22 mmol/L (ref 22–32)
Calcium: 8.3 mg/dL — ABNORMAL LOW (ref 8.9–10.3)
Chloride: 108 mmol/L (ref 98–111)
Creatinine, Ser: 0.56 mg/dL (ref 0.44–1.00)
GFR calc Af Amer: >60 mL/min (ref >60)
GFR calc non Af Amer: >60 mL/min (ref >60)
Glucose, Bld: 74 mg/dL (ref 70–99)
Potassium: 3.5 mmol/L (ref 3.5–5.1)
Sodium: 139 mmol/L (ref 135–145)
Total Bilirubin: 0.8 mg/dL (ref 0.3–1.2)
Total Protein: 5.7 g/dL — ABNORMAL LOW (ref 6.5–8.1)

## 2020-03-14 LAB — CBC
HCT: 28.9 % — ABNORMAL LOW (ref 36.0–46.0)
Hemoglobin: 8.7 g/dL — ABNORMAL LOW (ref 12.0–15.0)
MCH: 23.5 pg — ABNORMAL LOW (ref 26.0–34.0)
MCHC: 30.1 g/dL (ref 30.0–36.0)
MCV: 78.1 fL — ABNORMAL LOW (ref 80.0–100.0)
Platelets: 203 10*3/uL (ref 150–400)
RBC: 3.7 MIL/uL — ABNORMAL LOW (ref 3.87–5.11)
RDW: 16.5 % — ABNORMAL HIGH (ref 11.5–15.5)
WBC: 10.2 10*3/uL (ref 4.0–10.5)
nRBC: 0 % (ref 0.0–0.2)

## 2020-03-14 LAB — MAGNESIUM: Magnesium: 1.9 mg/dL (ref 1.7–2.4)

## 2020-03-14 MED ORDER — POTASSIUM CHLORIDE CRYS ER 20 MEQ PO TBCR
40.0000 meq | EXTENDED_RELEASE_TABLET | Freq: Once | ORAL | Status: AC
  Administered 2020-03-14: 40 meq via ORAL
  Filled 2020-03-14: qty 2

## 2020-03-14 MED ORDER — LACTULOSE 10 GM/15ML PO SOLN
20.0000 g | Freq: Two times a day (BID) | ORAL | Status: AC
  Administered 2020-03-14: 20 g via ORAL
  Filled 2020-03-14 (×2): qty 30

## 2020-03-14 NOTE — Progress Notes (Signed)
Subjective: CC: Abdominal pain Patient just waking up this morning and not sure about any abdominal pain today. She notes that the sharp pain she had yesterday in her lower abdomen that would get up to a 7/10 is not present this am. She denies any n/v. She reports pain yesterday is getting less frequent and was less intense then the day prior. She is passing flatus. Had a small soft BM yesterday.   Objective: Vital signs in last 24 hours: Temp:  [98.1 F (36.7 C)-99.3 F (37.4 C)] 98.2 F (36.8 C) (06/16 0556) Pulse Rate:  [79-88] 79 (06/16 0556) Resp:  [14-17] 14 (06/16 0556) BP: (111-121)/(63-70) 111/69 (06/16 0556) SpO2:  [98 %] 98 % (06/16 0556) Last BM Date: 03/13/20  Intake/Output from previous day: 06/15 0701 - 06/16 0700 In: 2300 [I.V.:1865.4; IV Piggyback:434.6] Out: -  Intake/Output this shift: No intake/output data recorded.  PE: Gen: Alert, NAD, pleasant Lungs: Rate and effort normal Abd: Soft,ND,tenderness of the lower abdomen L>R without peritonitis,slightly improved from yesterdays exam. +BS Psych: A&Ox3  Skin: no rashes noted, warm and dry  Lab Results:  Recent Labs    03/13/20 0456 03/14/20 0519  WBC 12.8* 10.2  HGB 8.6* 8.7*  HCT 28.0* 28.9*  PLT 186 203   BMET Recent Labs    03/13/20 0456 03/14/20 0519  NA 137 139  K 3.1* 3.5  CL 103 108  CO2 25 22  GLUCOSE 100* 74  BUN 6 7  CREATININE 0.55 0.56  CALCIUM 7.9* 8.3*   PT/INR Recent Labs    03/12/20 0615  LABPROT 13.8  INR 1.1   CMP     Component Value Date/Time   NA 139 03/14/2020 0519   K 3.5 03/14/2020 0519   CL 108 03/14/2020 0519   CO2 22 03/14/2020 0519   GLUCOSE 74 03/14/2020 0519   BUN 7 03/14/2020 0519   CREATININE 0.56 03/14/2020 0519   CALCIUM 8.3 (L) 03/14/2020 0519   PROT 5.7 (L) 03/14/2020 0519   ALBUMIN 2.6 (L) 03/14/2020 0519   AST 10 (L) 03/14/2020 0519   ALT 11 03/14/2020 0519   ALKPHOS 46 03/14/2020 0519   BILITOT 0.8 03/14/2020 0519    GFRNONAA >60 03/14/2020 0519   GFRAA >60 03/14/2020 0519   Lipase     Component Value Date/Time   LIPASE 24 03/11/2020 2135       Studies/Results: US Abdomen Limited  Result Date: 03/13/2020 CLINICAL DATA:  39 year old female with a perforated sigmoid colon and possible concern for underlying colorectal adenocarcinoma. Additionally, she had a lesion on her CT scan of the abdomen concerning for possible metastasis. She presents today for attempted ultrasound-guided biopsy of the same. EXAM: ULTRASOUND ABDOMEN LIMITED COMPARISON:  CT abdomen/pelvis 03/12/2020 FINDINGS: Ultrasound evaluation of the liver demonstrates a 2.1 x 2.4 x 2.0 cm lobular echogenic lesion in the right hepatic dome. The liver is otherwise unremarkable. No evidence of biliary ductal dilatation. IMPRESSION: Approximately 2.1 x 2.4 x 2.0 cm lobular homogeneously echogenic lesion in the right hepatic dome corresponds with the abnormality seen on the prior CT scan. Sonographically, this appearance is highly suggestive of a benign hemangioma. Given the slightly elevated risk of bleeding complication when biopsying a hemangioma, further imaging is recommended prior to proceeding with biopsy. Recommend MRI of the abdomen with gadolinium contrast which may provide a noninvasive diagnosis of benign hemangioma. If the MRI remains concerning for metastatic disease, or is indeterminate, then ultrasound-guided core biopsy should be pursued.  Signed, Criselda Peaches, MD, [facility] Electronically Signed   By: Jacqulynn Cadet M.D.   On: 03/13/2020 12:28    Anti-infectives: Anti-infectives (From admission, onward)   Start     Dose/Rate Route Frequency Ordered Stop   03/12/20 1200  piperacillin-tazobactam (ZOSYN) IVPB 3.375 g     Discontinue     3.375 g 12.5 mL/hr over 240 Minutes Intravenous Every 8 hours 03/12/20 0607     03/12/20 0300  piperacillin-tazobactam (ZOSYN)  IVPB 3.375 g        3.375 g 100 mL/hr over 30 Minutes Intravenous  Once 03/12/20 0247 03/12/20 0525       Assessment/Plan Localized/ containedsigmoid colon perforation in the setting of phlegmon and extensive colonic inflammation 2 cm right hepatic lobe mass - With history of intermittent rectal bleeding and liver lesion, there is concern for malignancy. This could also be diverticulitis with contained perforation and incidental finding of the liver on CT. [place] yesterday looked like hemangioma to IR. They are getting MRI for confirmation and read is currently pending. Determination of primary process (diverticulitis vs malignancy) will help determine the patients hospital course - No indication for emergent surgery at the present   - CEA 4.9 (wnl, hx tobacco use) - Continue iv abx - Allow sips of clears as pain is less frequent, less severe and wbc has normalized.  - We will continue to follow along with you  FEN -Sips and chips, IVF VTE -SCDs, okay for chemical prophylaxis from general surgery standpoint (hgb 8.7 and stable) ID -Zosyn 6/14 >> WBC 10.2, afebrile    LOS: 2 days    Jillyn Ledger , [facility] 03/14/2020, 9:18 AM Please see Amion for pager number during day hours 7:00am-4:30pm

## 2020-03-14 NOTE — Progress Notes (Signed)
PROGRESS NOTE    Stephanie Stephanie [name]  IWP:[id number] DOB: April 27, 1981 DOA: 03/11/2020 PCP: Patient, [name]   Brief Narrative:  39 year old female with no known past medical history presented to the hospital with complaints of abdominal pain.  CT scan showed concerns of rectal carcinoma with some perforation, 2 cm right hepatic lobe liver lesion and large stool burden.  Attempts of ultrasound-guided biopsy of liver lesion was performed but ultrasound showed concerns of possible hemangioma therefore MRI abdomen was ordered.  CEA levels were very minimally elevated.   Assessment & Plan:   Principal Problem:   Peritonitis with abscess of intestine (HCC) Active Problems:   Microcytic anemia   Colorectal carcinoma (HCC)  Abdominal pain concerns for intra-abdominal infection versus peritonitis Diverticulitis with possible abscess -Continue IV antibiotics-Zosyn, supportive care, bowel regimen, p.o. intake as tolerated.  Patient will eventually need colonoscopy.  Difficulty need MRI given patient received IV Feraheme.  Will need repeat MRI to reevaluate this area in about 3 months. For now we will plan on treating this as complicated diverticulitis with possible abscess.  In about 6-8 weeks time she will need a colonoscopy -CEA levels high normal.  Constipation -Aggressive bowel regimen.  Lactulose ordered  Right hepatic lobe liver lesion-likely hemangioma -Initially thought to be a liver mass but ultrasound concerning for hemangioma.  MRI with contrast ordered-difficult to read given recent IV Feraheme.  Repeat in 3 months  Hyperkalemia -Repletion ordered  Iron deficiency/microcytic anemia -IV iron 6/14, p.o. iron with bowel regimen  DVT prophylaxis: SCDs Start: 03/12/20 0603 Code Status: Full code Family Communication: None  Status is: Inpatient  Remains inpatient appropriate because:Inpatient level of care appropriate due to severity of illness   Dispo: The patient is from:  Home              Anticipated d/c is to: Home              Anticipated d/c date is: 1 day              Patient currently is not medically stable to d/c.  Maintain hospital stay today, slowly advance diet.    Subjective: Still having mild lower abdominal pain, 2-3 soft small bowel movements in last 24 hours.  Review of Systems Otherwise negative except as per HPI, including: Stephanie [name]: Denies fever, chills, night sweats or unintended weight loss. Resp: Denies cough, wheezing, shortness of breath. Cardiac: Denies chest pain, palpitations, orthopnea, paroxysmal nocturnal dyspnea. GI: Denies abdominal pain, nausea, vomiting, diarrhea or constipation GU: Denies dysuria, frequency, hesitancy or incontinence MS: Denies muscle aches, joint pain or swelling Neuro: Denies headache, neurologic deficits (focal weakness, numbness, tingling), abnormal gait Psych: Denies anxiety, depression, SI/HI/AVH Skin: Denies new rashes or lesions ID: Denies sick contacts, exotic exposures, travel  Examination:  Constitutional: Not in acute distress Respiratory: Clear to auscultation bilaterally Cardiovascular: Normal sinus rhythm, no rubs Abdomen: Lower part of the abdomen is slightly tender to touch but no rebound tenderness Musculoskeletal: No edema noted Skin: No rashes seen Neurologic: CN 2-12 grossly intact.  And nonfocal Psychiatric: Normal judgment and insight. Alert and oriented x 3. Normal mood.  Objective: Vitals:   03/13/20 0905 03/13/20 1422 03/13/20 2135 03/14/20 0556  BP: 134/68 121/63 121/70 111/69  Pulse: 82 88 86 79  Resp: 16 17 16 14   Temp:  99.3 F (37.4 C) 98.1 F (36.7 C) 98.2 F (36.8 C)  TempSrc:  Oral Oral Oral  SpO2: 100% 98% 98% 98%  Weight:  Height:        Intake/Output Summary (Last 24 hours) at 03/14/2020 1027 Last data filed at 03/14/2020 0612 Gross per 24 hour  Intake 2226.13 ml  Output --  Net 2226.13 ml   Filed Weights   03/11/20 2103  Weight: 74.8 kg      Data Reviewed:   CBC: Recent Labs  Lab 03/11/20 2135 03/12/20 0615 03/13/20 0456 03/14/20 0519  WBC 10.5 10.6* 12.8* 10.2  NEUTROABS  --  7.8* 10.3*  --   HGB 9.3* 9.1* 8.6* 8.7*  HCT 30.8* 30.5* 28.0* 28.9*  MCV 78.6* 78.6* 77.1* 78.1*  PLT 223 206 186 161   Basic Metabolic Panel: Recent Labs  Lab 03/11/20 2135 03/12/20 0615 03/13/20 0456 03/14/20 0519  NA 139 140 137 139  K 4.0 3.5 3.1* 3.5  CL 102 106 103 108  CO2 25 24 25 22   GLUCOSE 92 104* 100* 74  BUN 10 6 6 7   CREATININE 0.72 0.65 0.55 0.56  CALCIUM 8.6* 7.8* 7.9* 8.3*  MG  --   --  1.9 1.9   GFR: Estimated Creatinine Clearance: 98.7 mL/min (by C-G formula based on SCr of 0.56 mg/dL). Liver Function Tests: Recent Labs  Lab 03/11/20 2135 03/12/20 0615 03/13/20 0456 03/14/20 0519  AST 13* 10* 9* 10*  ALT 9 11 11 11   ALKPHOS 48 42 45 46  BILITOT 0.6 0.6 1.0 0.8  PROT 7.0 6.1* 5.8* 5.7*  ALBUMIN 3.4* 3.0* 2.8* 2.6*   Recent Labs  Lab 03/11/20 2135  LIPASE 24   No results for input(s): AMMONIA in the last 168 hours. Coagulation Profile: Recent Labs  Lab 03/12/20 0615  INR 1.1   Cardiac Enzymes: No results for input(s): CKTOTAL, CKMB, CKMBINDEX, TROPONINI in the last 168 hours. BNP (last 3 results) No results for input(s): PROBNP in the last 8760 hours. HbA1C: No results for input(s): HGBA1C in the last 72 hours. CBG: No results for input(s): GLUCAP in the last 168 hours. Lipid Profile: No results for input(s): CHOL, HDL, LDLCALC, TRIG, CHOLHDL, LDLDIRECT in the last 72 hours. Thyroid Function Tests: No results for input(s): TSH, T4TOTAL, FREET4, T3FREE, THYROIDAB in the last 72 hours. Anemia Panel: Recent Labs    03/12/20 0615  TIBC 301  IRON 8*   Sepsis Labs: Recent Labs  Lab 03/12/20 0340  LATICACIDVEN 0.6    Recent Results (from the past 240 hour(s))  Blood culture (routine x 2)     Status: None (Preliminary result)   Collection Time: 03/12/20  3:01 AM   Specimen:  BLOOD RIGHT HAND  Result Value Ref Range Status   Specimen Description   Final    BLOOD RIGHT HAND Performed at [facility] [address], [place], [place] [address]    Special Requests   Final    BOTTLES DRAWN AEROBIC ONLY Blood Culture adequate volume Performed at [facility] [address], [place], [place] [address]    Culture   Final    NO GROWTH 1 DAY Performed at [facility], [place] [address], [place], [place] [address]    Report Status PENDING  Incomplete  Blood culture (routine x 2)     Status: None (Preliminary result)   Collection Time: 03/12/20  3:01 AM   Specimen: BLOOD  Result Value Ref Range Status   Specimen Description   Final    BLOOD LEFT ARM Performed at [facility] [address] [place], [place] [address]  Special Requests   Final    BOTTLES DRAWN AEROBIC AND ANAEROBIC Blood Culture adequate volume Performed at [facility] [address], [place], [place] [address]    Culture   Final    NO GROWTH 1 DAY Performed at [facility], [place] [address], [place], [place] [address]    Report Status PENDING  Incomplete  SARS Coronavirus 2 by RT PCR (hospital order, performed in [facility] hospital lab) Nasopharyngeal Nasopharyngeal Swab     Status: None   Collection Time: 03/12/20  3:32 AM   Specimen: Nasopharyngeal Swab  Result Value Ref Range Status   SARS Coronavirus 2 NEGATIVE NEGATIVE Final    Comment: (NOTE) SARS-CoV-2 target nucleic acids are NOT DETECTED.  The SARS-CoV-2 RNA is generally detectable in upper and lower respiratory specimens during the acute phase of infection. The lowest concentration of SARS-CoV-2 viral copies this assay can detect is 250 copies / mL. A negative result does not preclude SARS-CoV-2 infection and should not be used as the sole basis for treatment or other patient management decisions.  A negative result may occur  with improper specimen collection / handling, submission of specimen other than nasopharyngeal swab, presence of viral mutation(s) within the areas targeted by this assay, and inadequate number of viral copies (<250 copies / mL). A negative result must be combined with clinical observations, patient history, and epidemiological information.  Fact Sheet for Patients:   StrictlyIdeas.no  Fact Sheet for Healthcare Providers: BankingDealers.co.za  This test is not yet approved or  cleared by the [place] FDA and has been authorized for detection and/or diagnosis of SARS-CoV-2 by FDA under an Emergency Use Authorization (EUA).  This EUA will remain in effect (meaning this test can be used) for the duration of the COVID-19 declaration under Section 564(b)(1) of the Act, 21 U.S.C. section 360bbb-3(b)(1), unless the authorization is terminated or revoked sooner.  Performed at [facility], [place] [address] [place], [place] [address]          Radiology Studies: MR ABDOMEN W WO CONTRAST  Result Date: 03/14/2020 CLINICAL DATA:  39 year old female with history of liver lesion noted on prior CT examination. Follow-up study. EXAM: MRI ABDOMEN WITHOUT AND WITH CONTRAST TECHNIQUE: Multiplanar multisequence MR imaging of the abdomen was performed both before and after the administration of intravenous contrast. CONTRAST:  86mL GADAVIST GADOBUTROL 1 MMOL/ML IV SOLN COMPARISON:  No prior abdominal MRI. CT the abdomen and pelvis 03/12/2020. FINDINGS: Comment: Today's study is limited by recent administration of Feraheme on 03/12/2020. This grossly alters the signal characteristics of both the liver and splenic parenchyma, and completely compromises interpretation of signal intensity on post gadolinium imaging. Lower chest: Unremarkable. Hepatobiliary: Diffuse low signal intensity throughout the hepatic parenchyma on T2 weighted images,  presumably a consequence of recent Feraheme injection. In segment 7 of the liver (axial image 8 of series 5) there is a 2.5 x 1.9 cm well-defined lesion which is slightly T2 hyperintense. This lesion appears hyperintense on pre gadolinium T1 weighted images (likely a consequence of Feraheme). Interpretation of enhancement within the lesion is compromised by presence of Feraheme. No other hepatic lesions are confidently identified on today's examination. No intra or extrahepatic biliary ductal dilatation. Gallbladder is normal in appearance. Pancreas: No pancreatic mass or peripancreatic fluid collections or inflammatory changes. Spleen: Diffuse low signal intensity throughout the spleen on T2 weighted images, presumably consequence of Feraheme injection. Adrenals/Urinary Tract: Bilateral kidneys and adrenal glands are normal in  appearance. No hydroureteronephrosis in the visualized portions of the abdomen. Stomach/Bowel: Unremarkable. Vascular/Lymphatic: No aneurysm identified in the visualized abdominal vasculature. No lymphadenopathy noted in the abdomen. Other: No significant volume of ascites noted in the visualized portions of the peritoneal cavity. Musculoskeletal: Diffuse low signal intensity throughout the marrow containing spaces, presumably a consequence of recent Feraheme injection. No aggressive appearing osseous lesions are noted in the visualized portions of the skeleton. IMPRESSION: 1. Limited study secondary to recent Feraheme injection. The lesion of concern in segment 7 of the liver has indeterminate imaging characteristics on today's examination. Given the appearance on prior ultrasound examination, this may simply represent a cavernous hemangioma. However, repeat abdominal MRI with and without IV gadolinium is recommended in 3 months (after clearance of Feraheme) to re-evaluate this lesion, both to ensure stability and definitive characterization. Electronically Signed   By: Vinnie Langton M.D.    On: 03/14/2020 09:27   US Abdomen Limited  Result Date: 03/13/2020 CLINICAL DATA:  39 year old female with a perforated sigmoid colon and possible concern for underlying colorectal adenocarcinoma. Additionally, she had a lesion on her CT scan of the abdomen concerning for possible metastasis. She presents today for attempted ultrasound-guided biopsy of the same. EXAM: ULTRASOUND ABDOMEN LIMITED COMPARISON:  CT abdomen/pelvis 03/12/2020 FINDINGS: Ultrasound evaluation of the liver demonstrates a 2.1 x 2.4 x 2.0 cm lobular echogenic lesion in the right hepatic dome. The liver is otherwise unremarkable. No evidence of biliary ductal dilatation. IMPRESSION: Approximately 2.1 x 2.4 x 2.0 cm lobular homogeneously echogenic lesion in the right hepatic dome corresponds with the abnormality seen on the prior CT scan. Sonographically, this appearance is highly suggestive of a benign hemangioma. Given the slightly elevated risk of bleeding complication when biopsying a hemangioma, further imaging is recommended prior to proceeding with biopsy. Recommend MRI of the abdomen with gadolinium contrast which may provide a noninvasive diagnosis of benign hemangioma. If the MRI remains concerning for metastatic disease, or is indeterminate, then ultrasound-guided core biopsy should be pursued. Signed, Criselda Peaches, MD, [facility] Radiology Electronically Signed   By: Jacqulynn Cadet M.D.   On: 03/13/2020 12:28        Scheduled Meds: . ferrous sulfate  325 mg Oral BID WC  . polyethylene glycol  17 g Oral Daily  . potassium chloride  40 mEq Oral Once  . senna-docusate  2 tablet Oral BID   Continuous Infusions: . sodium chloride 75 mL/hr at 03/14/20 0612  . sodium chloride 10 mL/hr at 03/13/20 1800  . piperacillin-tazobactam (ZOSYN)  IV 3.375 g (03/14/20 0500)     LOS: 2 days   Time spent= 35 mins    Leigh Kaeding Arsenio Loader, MD [facility]  If 7PM-7AM, please contact night-coverage  03/14/2020, 10:27 AM

## 2020-03-15 LAB — CBC
HCT: 26.3 % — ABNORMAL LOW (ref 36.0–46.0)
Hemoglobin: 8.1 g/dL — ABNORMAL LOW (ref 12.0–15.0)
MCH: 24 pg — ABNORMAL LOW (ref 26.0–34.0)
MCHC: 30.8 g/dL (ref 30.0–36.0)
MCV: 77.8 fL — ABNORMAL LOW (ref 80.0–100.0)
Platelets: 210 10*3/uL (ref 150–400)
RBC: 3.38 MIL/uL — ABNORMAL LOW (ref 3.87–5.11)
RDW: 16.5 % — ABNORMAL HIGH (ref 11.5–15.5)
WBC: 7.1 10*3/uL (ref 4.0–10.5)
nRBC: 0 % (ref 0.0–0.2)

## 2020-03-15 LAB — COMPREHENSIVE METABOLIC PANEL
ALT: 11 U/L (ref 0–44)
AST: 10 U/L — ABNORMAL LOW (ref 15–41)
Albumin: 2.6 g/dL — ABNORMAL LOW (ref 3.5–5.0)
Alkaline Phosphatase: 43 U/L (ref 38–126)
Anion gap: 8 (ref 5–15)
BUN: <5 mg/dL — ABNORMAL LOW (ref 6–20)
CO2: 23 mmol/L (ref 22–32)
Calcium: 8.1 mg/dL — ABNORMAL LOW (ref 8.9–10.3)
Chloride: 107 mmol/L (ref 98–111)
Creatinine, Ser: 0.56 mg/dL (ref 0.44–1.00)
GFR calc Af Amer: >60 mL/min (ref >60)
GFR calc non Af Amer: >60 mL/min (ref >60)
Glucose, Bld: 89 mg/dL (ref 70–99)
Potassium: 3.5 mmol/L (ref 3.5–5.1)
Sodium: 138 mmol/L (ref 135–145)
Total Bilirubin: 0.5 mg/dL (ref 0.3–1.2)
Total Protein: 5.7 g/dL — ABNORMAL LOW (ref 6.5–8.1)

## 2020-03-15 LAB — MAGNESIUM: Magnesium: 1.9 mg/dL (ref 1.7–2.4)

## 2020-03-15 NOTE — TOC Initial Note (Signed)
Transition of Care [facility]) - Initial/Assessment Note    Patient Details  Name: Stephanie [name] MRN: [id number] Date of Birth: 1980/11/26  [facility]) CM/SW Contact:    Allysen Lazo C Tarpley-Carter, [place] Phone Number: 03/15/2020, 3:11 PM  Clinical Narrative:                 CSW spoke with patient in regards to financial assistance, pcp, and a follow up pcp appointment.  Patient was receptive to financial assistance application.  She will consider [facility] as a pcp, but will also be in search of a pcp on her own.  TOC will be of assistance if needed upon discharge.  At this time no further assistance is needed.   Expected Discharge Plan: Home/Self Care Barriers to Discharge: Continued Medical Work up   Patient Goals and CMS Choice Patient states their goals for this hospitalization and ongoing recovery are:: Patient will obtain health coverage and continue with medical treatment as directed. CMS Medicare.gov Compare Post Acute Care list provided to:: Patient Choice offered to / list presented to : Patient  Expected Discharge Plan and Services Expected Discharge Plan: Home/Self Care In-house Referral: Clinical Social Work Discharge Planning Services: CM Consult   Living arrangements for the past 2 months: Single Family Home                                      Prior Living Arrangements/Services Living arrangements for the past 2 months: Single Family Home Lives with:: Minor Children Patient language and need for interpreter reviewed:: No Do you feel safe going back to the place where you live?: Yes      Need for Family Participation in Patient Care: Yes (Comment) Care giver support system in place?: No (comment)   Criminal Activity/Legal Involvement Pertinent to Current Situation/Hospitalization: No - Comment as needed  Activities of Daily Living Home Assistive Devices/Equipment: None ADL Screening (condition at time of  admission) Patient's cognitive ability adequate to safely complete daily activities?: Yes Is the patient deaf or have difficulty hearing?: No Does the patient have difficulty seeing, even when wearing glasses/contacts?: No Does the patient have difficulty concentrating, remembering, or making decisions?: No Patient able to express need for assistance with ADLs?: Yes Does the patient have difficulty dressing or bathing?: No Independently performs ADLs?: Yes (appropriate for developmental age) Does the patient have difficulty walking or climbing stairs?: No Weakness of Legs: None Weakness of Arms/Hands: None  Permission Sought/Granted Permission sought to share information with : Case Manager Permission granted to share information with : No              Emotional Assessment Appearance:: Well-Groomed, Appears stated age Attitude/Demeanor/Rapport: Engaged, Guarded Affect (typically observed): Accepting Orientation: : Oriented to Self, Oriented to Place, Oriented to  Time, Oriented to Situation Alcohol / Substance Use: Not Applicable Psych Involvement: No (comment)  Admission diagnosis:  Liver mass [R16.0] Liver mass, right lobe [R16.0] Mass of colon [K63.89] Peritonitis with abscess of intestine (HCC) [K65.1] Diverticulitis of large intestine with perforation and abscess, unspecified bleeding status [K57.20] Patient Active Problem List   Diagnosis Date Noted  . Lactose intolerance 03/12/2020  . Microcytic anemia 03/12/2020  . Colorectal carcinoma ([place]) 03/12/2020  . Peritonitis with abscess of intestine ([place]) 03/12/2020   PCP:  Patient, [name] Pharmacy:   [facility] [address] [place]  [place] [address] Phone: [phone] Fax: [phone]     Social Determinants of Health (SDOH) Interventions    Readmission Risk Interventions No flowsheet data found.

## 2020-03-15 NOTE — Progress Notes (Signed)
[facility] Progress Note     Subjective: Patient very frustrated and feels like she has been told several different things this week. Wants to be sure she does not have cancer and frustrated that we do not have a 212% certainty. Still having some crampy lower abdominal pain but reports she also started her cycle again. She notes some pain with drinking but is tolerating FLD and having bowel function. Denies nausea or vomiting.   Objective: Vital signs in last 24 hours: Temp:  [98.1 F (36.7 C)-98.9 F (37.2 C)] 98.4 F (36.9 C) (06/17 0519) Pulse Rate:  [72-82] 72 (06/17 0519) Resp:  [18-19] 18 (06/17 0519) BP: (110-121)/(64-79) 111/69 (06/17 0519) SpO2:  [98 %-100 %] 100 % (06/17 0519) Last BM Date: 03/13/20  Intake/Output from previous day: 06/16 0701 - 06/17 0700 In: 3384.9 [P.O.:840; I.V.:2362.7; IV Piggyback:182.2] Out: 0  Intake/Output this shift: No intake/output data recorded.  PE: General: WD, WN white female who is laying in bed in NAD Heart: regular, rate, and rhythm. Palpable radial pulses bilaterally Lungs:Respiratory effort nonlabored Abd: soft, NT, ND, +BS, no masses, hernias, or organomegaly MS: all 4 extremities are symmetrical with no cyanosis, clubbing, or edema. Skin: warm and dry with no masses, lesions, or rashes Psych: A&Ox3 with an anxious affect.    Lab Results:  Recent Labs    03/14/20 0519 03/15/20 0453  WBC 10.2 7.1  HGB 8.7* 8.1*  HCT 28.9* 26.3*  PLT 203 210   BMET Recent Labs    03/14/20 0519 03/15/20 0453  NA 139 138  K 3.5 3.5  CL 108 107  CO2 22 23  GLUCOSE 74 89  BUN 7 <5*  CREATININE 0.56 0.56  CALCIUM 8.3* 8.1*   PT/INR No results for input(s): LABPROT, INR in the last 72 hours. CMP     Component Value Date/Time   NA 138 03/15/2020 0453   K 3.5 03/15/2020 0453   CL 107 03/15/2020 0453   CO2 23 03/15/2020 0453   GLUCOSE 89 03/15/2020 0453   BUN <5 (L) 03/15/2020 0453   CREATININE 0.56 03/15/2020  0453   CALCIUM 8.1 (L) 03/15/2020 0453   PROT 5.7 (L) 03/15/2020 0453   ALBUMIN 2.6 (L) 03/15/2020 0453   AST 10 (L) 03/15/2020 0453   ALT 11 03/15/2020 0453   ALKPHOS 43 03/15/2020 0453   BILITOT 0.5 03/15/2020 0453   GFRNONAA >60 03/15/2020 0453   GFRAA >60 03/15/2020 0453   Lipase     Component Value Date/Time   LIPASE 24 03/11/2020 2135       Studies/Results: MR ABDOMEN W WO CONTRAST  Result Date: 03/14/2020 CLINICAL DATA:  39 year old female with history of liver lesion noted on prior CT examination. Follow-up study. EXAM: MRI ABDOMEN WITHOUT AND WITH CONTRAST TECHNIQUE: Multiplanar multisequence MR imaging of the abdomen was performed both before and after the administration of intravenous contrast. CONTRAST:  36mL GADAVIST GADOBUTROL 1 MMOL/ML IV SOLN COMPARISON:  No prior abdominal MRI. CT the abdomen and pelvis 03/12/2020. FINDINGS: Comment: Today's study is limited by recent administration of Feraheme on 03/12/2020. This grossly alters the signal characteristics of both the liver and splenic parenchyma, and completely compromises interpretation of signal intensity on post gadolinium imaging. Lower chest: Unremarkable. Hepatobiliary: Diffuse low signal intensity throughout the hepatic parenchyma on T2 weighted images, presumably a consequence of recent Feraheme injection. In segment 7 of the liver (axial image 8 of series 5) there is a 2.5 x 1.9 cm well-defined lesion which is  slightly T2 hyperintense. This lesion appears hyperintense on pre gadolinium T1 weighted images (likely a consequence of Feraheme). Interpretation of enhancement within the lesion is compromised by presence of Feraheme. No other hepatic lesions are confidently identified on today's examination. No intra or extrahepatic biliary ductal dilatation. Gallbladder is normal in appearance. Pancreas: No pancreatic mass or peripancreatic fluid collections or inflammatory changes. Spleen: Diffuse low signal intensity  throughout the spleen on T2 weighted images, presumably consequence of Feraheme injection. Adrenals/Urinary Tract: Bilateral kidneys and adrenal glands are normal in appearance. No hydroureteronephrosis in the visualized portions of the abdomen. Stomach/Bowel: Unremarkable. Vascular/Lymphatic: No aneurysm identified in the visualized abdominal vasculature. No lymphadenopathy noted in the abdomen. Other: No significant volume of ascites noted in the visualized portions of the peritoneal cavity. Musculoskeletal: Diffuse low signal intensity throughout the marrow containing spaces, presumably a consequence of recent Feraheme injection. No aggressive appearing osseous lesions are noted in the visualized portions of the skeleton. IMPRESSION: 1. Limited study secondary to recent Feraheme injection. The lesion of concern in segment 7 of the liver has indeterminate imaging characteristics on today's examination. Given the appearance on prior ultrasound examination, this may simply represent a cavernous hemangioma. However, repeat abdominal MRI with and without IV gadolinium is recommended in 3 months (after clearance of Feraheme) to re-evaluate this lesion, both to ensure stability and definitive characterization. Electronically Signed   By: Vinnie Langton M.D.   On: 03/14/2020 09:27   US Abdomen Limited  Result Date: 03/13/2020 CLINICAL DATA:  39 year old female with a perforated sigmoid colon and possible concern for underlying colorectal adenocarcinoma. Additionally, she had a lesion on her CT scan of the abdomen concerning for possible metastasis. She presents today for attempted ultrasound-guided biopsy of the same. EXAM: ULTRASOUND ABDOMEN LIMITED COMPARISON:  CT abdomen/pelvis 03/12/2020 FINDINGS: Ultrasound evaluation of the liver demonstrates a 2.1 x 2.4 x 2.0 cm lobular echogenic lesion in the right hepatic dome. The liver is otherwise unremarkable. No evidence of biliary ductal dilatation. IMPRESSION:  Approximately 2.1 x 2.4 x 2.0 cm lobular homogeneously echogenic lesion in the right hepatic dome corresponds with the abnormality seen on the prior CT scan. Sonographically, this appearance is highly suggestive of a benign hemangioma. Given the slightly elevated risk of bleeding complication when biopsying a hemangioma, further imaging is recommended prior to proceeding with biopsy. Recommend MRI of the abdomen with gadolinium contrast which may provide a noninvasive diagnosis of benign hemangioma. If the MRI remains concerning for metastatic disease, or is indeterminate, then ultrasound-guided core biopsy should be pursued. Signed, Criselda Peaches, MD, [facility] Electronically Signed   By: Jacqulynn Cadet M.D.   On: 03/13/2020 12:28    Anti-infectives: Anti-infectives (From admission, onward)   Start     Dose/Rate Route Frequency Ordered Stop   03/12/20 1200  piperacillin-tazobactam (ZOSYN) IVPB 3.375 g     Discontinue     3.375 g 12.5 mL/hr over 240 Minutes Intravenous Every 8 hours 03/12/20 0607     03/12/20 0300  piperacillin-tazobactam (ZOSYN) IVPB 3.375 g        3.375 g 100 mL/hr over 30 Minutes Intravenous  Once 03/12/20 0247 03/12/20 0525       Assessment/Plan Localized/ containedsigmoid colon perforation in the setting of phlegmon and extensive colonic inflammation 2 cm right hepatic lobe mass - With history of intermittent rectal bleedingand liver lesion, there is concern for malignancy. This could also be diverticulitis with contained perforation and incidental finding of the  liver on CT. [place] yesterday looked like hemangioma to IR. MRI was equivocal.  - CEA4.9 (wnl, hx tobacco use) - would recommend 10-12 days PO abx on discharge - agree with advancement to soft diet - no indication for acute surgical intervention at this time - I would recommend colonoscopy in 6-8 weeks to evaluate further  FEN - soft  diet VTE -SCDs, okay for chemical prophylaxis from general surgery standpoint  ID -[id number]/[id number] >> WBC 7.1, afebrile   LOS: 3 days    Norm Parcel , [facility] 03/15/2020, 8:07 AM Please see Amion for pager number during day hours 7:00am-4:30pm

## 2020-03-15 NOTE — Progress Notes (Signed)
PROGRESS NOTE    Stephanie [name]  IHW:[id number] DOB: Feb 02, 1981 DOA: 03/11/2020 PCP: Patient, [name]   Brief Narrative:  39 year old female with no known past medical history presented to the hospital with complaints of abdominal pain.  CT scan showed concerns of rectal carcinoma with some perforation, 2 cm right hepatic lobe liver lesion and large stool burden.  Attempts of ultrasound-guided biopsy of liver lesion was performed but ultrasound showed concerns of possible hemangioma therefore MRI abdomen was ordered.  CEA levels were very minimally elevated.   Assessment & Plan:   Principal Problem:   Peritonitis with abscess of intestine (HCC) Active Problems:   Microcytic anemia   Colorectal carcinoma (HCC)  Abdominal pain concerns for intra-abdominal infection versus peritonitis Diverticulitis with possible abscess -Continue IV antibiotics-Zosyn, supportive care, bowel regimen, p.o. intake as tolerated.  Patient will eventually need colonoscopy.  Difficulty need MRI given patient received IV Feraheme.  Will need repeat MRI in 3 months For now we will plan on treating this as complicated diverticulitis with possible abscess.  Outpatient follow-up with GI in about 6--8 weeks for a colonoscopy. -CEA levels high normal.  Constipation -Aggressive bowel regimen.  Lactulose ordered  Right hepatic lobe liver lesion-likely hemangioma -Initially thought to be a liver mass but ultrasound concerning for hemangioma.  MRI with contrast ordered-difficult to read given recent IV Feraheme.  Repeat in 3 months  Hyperkalemia -Repletion ordered  Iron deficiency/microcytic anemia -IV iron 6/14, p.o. iron with bowel regimen  DVT prophylaxis: SCDs Start: 03/12/20 0603 Code Status: Full code Family Communication: Discussed extensively with her mother this morning  Status is: Inpatient  Remains inpatient appropriate because:Inpatient level of care appropriate due to severity of  illness   Dispo: The patient is from: Home              Anticipated d/c is to: Home              Anticipated d/c date is: 1 day              Patient currently is not medically stable to d/c.  Maintain hospital stay for 24 hours to ensure she continues to tolerate oral soft diet.  If so she will be discharged home tomorrow with p.o. antibiotics.  Subjective: This morning still having some abdominal discomfort but greatly improved.  I had an extensive discussion with the patient and her mother over the phone while I was in the room.  Review of Systems Otherwise negative except as per HPI, including: General: Denies fever, chills, night sweats or unintended weight loss. Resp: Denies cough, wheezing, shortness of breath. Cardiac: Denies chest pain, palpitations, orthopnea, paroxysmal nocturnal dyspnea. GI: Denies abdominal pain, , vomiting, diarrhea or constipation GU: Denies dysuria, frequency, hesitancy or incontinence MS: Denies muscle aches, joint pain or swelling Neuro: Denies headache, neurologic deficits (focal weakness, numbness, tingling), abnormal gait Psych: Denies anxiety, depression, SI/HI/AVH Skin: Denies new rashes or lesions ID: Denies sick contacts, exotic exposures, travel Examination: Constitutional: Not in acute distress Respiratory: Clear to auscultation bilaterally Cardiovascular: Normal sinus rhythm, no rubs Abdomen: Nontender nondistended good bowel sounds Musculoskeletal: No edema noted Skin: No rashes seen Neurologic: CN 2-12 grossly intact.  And nonfocal Psychiatric: Normal judgment and insight. Alert and oriented x 3. Normal mood.  Objective: Vitals:   03/14/20 0556 03/14/20 1425 03/14/20 2109 03/15/20 0519  BP: 111/69 121/79 110/64 111/69  Pulse: 79 82 73 72  Resp: 14 19 18 18   Temp: 98.2 F (36.8 C)  98.1 F (36.7 C) 98.9 F (37.2 C) 98.4 F (36.9 C)  TempSrc: Oral Oral Oral Oral  SpO2: 98% 98% 100% 100%  Weight:      Height:         Intake/Output Summary (Last 24 hours) at 03/15/2020 1110 Last data filed at 03/15/2020 0850 Gross per 24 hour  Intake 3294.92 ml  Output 0 ml  Net 3294.92 ml   Filed Weights   03/11/20 2103  Weight: 74.8 kg     Data Reviewed:   CBC: Recent Labs  Lab 03/11/20 2135 03/12/20 0615 03/13/20 0456 03/14/20 0519 03/15/20 0453  WBC 10.5 10.6* 12.8* 10.2 7.1  NEUTROABS  --  7.8* 10.3*  --   --   HGB 9.3* 9.1* 8.6* 8.7* 8.1*  HCT 30.8* 30.5* 28.0* 28.9* 26.3*  MCV 78.6* 78.6* 77.1* 78.1* 77.8*  PLT 223 206 186 203 709   Basic Metabolic Panel: Recent Labs  Lab 03/11/20 2135 03/12/20 0615 03/13/20 0456 03/14/20 0519 03/15/20 0453  NA 139 140 137 139 138  K 4.0 3.5 3.1* 3.5 3.5  CL 102 106 103 108 107  CO2 25 24 25 22 23   GLUCOSE 92 104* 100* 74 89  BUN 10 6 6 7  <5*  CREATININE 0.72 0.65 0.55 0.56 0.56  CALCIUM 8.6* 7.8* 7.9* 8.3* 8.1*  MG  --   --  1.9 1.9 1.9   GFR: Estimated Creatinine Clearance: 98.7 mL/min (by C-G formula based on SCr of 0.56 mg/dL). Liver Function Tests: Recent Labs  Lab 03/11/20 2135 03/12/20 0615 03/13/20 0456 03/14/20 0519 03/15/20 0453  AST 13* 10* 9* 10* 10*  ALT 9 11 11 11 11   ALKPHOS 48 42 45 46 43  BILITOT 0.6 0.6 1.0 0.8 0.5  PROT 7.0 6.1* 5.8* 5.7* 5.7*  ALBUMIN 3.4* 3.0* 2.8* 2.6* 2.6*   Recent Labs  Lab 03/11/20 2135  LIPASE 24   No results for input(s): AMMONIA in the last 168 hours. Coagulation Profile: Recent Labs  Lab 03/12/20 0615  INR 1.1   Cardiac Enzymes: No results for input(s): CKTOTAL, CKMB, CKMBINDEX, TROPONINI in the last 168 hours. BNP (last 3 results) No results for input(s): PROBNP in the last 8760 hours. HbA1C: No results for input(s): HGBA1C in the last 72 hours. CBG: No results for input(s): GLUCAP in the last 168 hours. Lipid Profile: No results for input(s): CHOL, HDL, LDLCALC, TRIG, CHOLHDL, LDLDIRECT in the last 72 hours. Thyroid Function Tests: No results for input(s): TSH,  T4TOTAL, FREET4, T3FREE, THYROIDAB in the last 72 hours. Anemia Panel: No results for input(s): VITAMINB12, FOLATE, FERRITIN, TIBC, IRON, RETICCTPCT in the last 72 hours. Sepsis Labs: Recent Labs  Lab 03/12/20 0340  LATICACIDVEN 0.6    Recent Results (from the past 240 hour(s))  Blood culture (routine x 2)     Status: None (Preliminary result)   Collection Time: 03/12/20  3:01 AM   Specimen: BLOOD RIGHT HAND  Result Value Ref Range Status   Specimen Description   Final    BLOOD RIGHT HAND Performed at [facility], [place] [address] [place], [place] [address]    Special Requests   Final    BOTTLES DRAWN AEROBIC ONLY Blood Culture adequate volume Performed at [place] [address] [place], [place] [address]    Culture   Final    NO GROWTH 3 DAYS Performed at [facility], [place] [address] [place], [place] [address]    Report Status PENDING  Incomplete  Blood culture (routine x 2)     Status: None (Preliminary result)   Collection Time: 03/12/20  3:01 AM   Specimen: BLOOD  Result Value Ref Range Status   Specimen Description   Final    BLOOD LEFT ARM Performed at [place] [address] [place], [place] [address]    Special Requests   Final    BOTTLES DRAWN AEROBIC AND ANAEROBIC Blood Culture adequate volume Performed at [place] [address], [place], [place] [address]    Culture   Final    NO GROWTH 3 DAYS Performed at [facility], [place] [address], [place], [place] [address]    Report Status PENDING  Incomplete  SARS Coronavirus 2 by RT PCR (hospital order, performed in [facility] hospital lab) Nasopharyngeal Nasopharyngeal Swab     Status: None   Collection Time: 03/12/20  3:32 AM   Specimen: Nasopharyngeal Swab  Result Value Ref Range Status   SARS Coronavirus 2 NEGATIVE NEGATIVE Final    Comment: (NOTE) SARS-CoV-2 target nucleic acids are NOT  DETECTED.  The SARS-CoV-2 RNA is generally detectable in upper and lower respiratory specimens during the acute phase of infection. The lowest concentration of SARS-CoV-2 viral copies this assay can detect is 250 copies / mL. A negative result does not preclude SARS-CoV-2 infection and should not be used as the sole basis for treatment or other patient management decisions.  A negative result may occur with improper specimen collection / handling, submission of specimen other than nasopharyngeal swab, presence of viral mutation(s) within the areas targeted by this assay, and inadequate number of viral copies (<250 copies / mL). A negative result must be combined with clinical observations, patient history, and epidemiological information.  Fact Sheet for Patients:   StrictlyIdeas.no  Fact Sheet for Healthcare Providers: BankingDealers.co.za  This test is not yet approved or  cleared by the [place] FDA and has been authorized for detection and/or diagnosis of SARS-CoV-2 by FDA under an Emergency Use Authorization (EUA).  This EUA will remain in effect (meaning this test can be used) for the duration of the COVID-19 declaration under Section 564(b)(1) of the Act, 21 U.S.C. section 360bbb-3(b)(1), unless the authorization is terminated or revoked sooner.  Performed at [facility], [place] [address], [place], [place] [address]          Radiology Studies: MR ABDOMEN W WO CONTRAST  Result Date: 03/14/2020 CLINICAL DATA:  39 year old female with history of liver lesion noted on prior CT examination. Follow-up study. EXAM: MRI ABDOMEN WITHOUT AND WITH CONTRAST TECHNIQUE: Multiplanar multisequence MR imaging of the abdomen was performed both before and after the administration of intravenous contrast. CONTRAST:  72mL GADAVIST GADOBUTROL 1 MMOL/ML IV SOLN COMPARISON:  No prior abdominal MRI. CT the abdomen and pelvis  03/12/2020. FINDINGS: Comment: Today's study is limited by recent administration of Feraheme on 03/12/2020. This grossly alters the signal characteristics of both the liver and splenic parenchyma, and completely compromises interpretation of signal intensity on post gadolinium imaging. Lower chest: Unremarkable. Hepatobiliary: Diffuse low signal intensity throughout the hepatic parenchyma on T2 weighted images, presumably a consequence of recent Feraheme injection. In segment 7 of the liver (axial image 8 of series 5) there is a 2.5 x 1.9 cm well-defined lesion which is slightly T2 hyperintense. This lesion appears hyperintense on pre gadolinium T1 weighted images (likely a consequence of Feraheme). Interpretation of enhancement within the lesion is compromised by presence  of Feraheme. No other hepatic lesions are confidently identified on today's examination. No intra or extrahepatic biliary ductal dilatation. Gallbladder is normal in appearance. Pancreas: No pancreatic mass or peripancreatic fluid collections or inflammatory changes. Spleen: Diffuse low signal intensity throughout the spleen on T2 weighted images, presumably consequence of Feraheme injection. Adrenals/Urinary Tract: Bilateral kidneys and adrenal glands are normal in appearance. No hydroureteronephrosis in the visualized portions of the abdomen. Stomach/Bowel: Unremarkable. Vascular/Lymphatic: No aneurysm identified in the visualized abdominal vasculature. No lymphadenopathy noted in the abdomen. Other: No significant volume of ascites noted in the visualized portions of the peritoneal cavity. Musculoskeletal: Diffuse low signal intensity throughout the marrow containing spaces, presumably a consequence of recent Feraheme injection. No aggressive appearing osseous lesions are noted in the visualized portions of the skeleton. IMPRESSION: 1. Limited study secondary to recent Feraheme injection. The lesion of concern in segment 7 of the liver has  indeterminate imaging characteristics on today's examination. Given the appearance on prior ultrasound examination, this may simply represent a cavernous hemangioma. However, repeat abdominal MRI with and without IV gadolinium is recommended in 3 months (after clearance of Feraheme) to re-evaluate this lesion, both to ensure stability and definitive characterization. Electronically Signed   By: Vinnie Langton M.D.   On: 03/14/2020 09:27        Scheduled Meds: . ferrous sulfate  325 mg Oral BID WC  . polyethylene glycol  17 g Oral Daily  . senna-docusate  2 tablet Oral BID   Continuous Infusions: . sodium chloride 10 mL/hr at 03/13/20 1800  . piperacillin-tazobactam (ZOSYN)  IV 3.375 g (03/15/20 0300)     LOS: 3 days   Time spent= 35 mins    Jacob Chamblee Arsenio Loader, MD [facility]  If 7PM-7AM, please contact night-coverage  03/15/2020, 11:10 AM

## 2020-03-16 LAB — MAGNESIUM: Magnesium: 1.9 mg/dL (ref 1.7–2.4)

## 2020-03-16 LAB — CBC
HCT: 27.6 % — ABNORMAL LOW (ref 36.0–46.0)
Hemoglobin: 8.5 g/dL — ABNORMAL LOW (ref 12.0–15.0)
MCH: 23.9 pg — ABNORMAL LOW (ref 26.0–34.0)
MCHC: 30.8 g/dL (ref 30.0–36.0)
MCV: 77.7 fL — ABNORMAL LOW (ref 80.0–100.0)
Platelets: 246 10*3/uL (ref 150–400)
RBC: 3.55 MIL/uL — ABNORMAL LOW (ref 3.87–5.11)
RDW: 16.6 % — ABNORMAL HIGH (ref 11.5–15.5)
WBC: 8.3 10*3/uL (ref 4.0–10.5)
nRBC: 0 % (ref 0.0–0.2)

## 2020-03-16 MED ORDER — POLYETHYLENE GLYCOL 3350 17 G PO PACK: 17.0000 g | PACK | Freq: Every day | ORAL | 0 refills | Status: DC

## 2020-03-16 MED ORDER — SENNOSIDES-DOCUSATE SODIUM 8.6-50 MG PO TABS: 2.0000 | ORAL_TABLET | Freq: Two times a day (BID) | ORAL | 0 refills | Status: DC

## 2020-03-16 MED ORDER — SACCHAROMYCES BOULARDII 250 MG PO CAPS
250.0000 mg | ORAL_CAPSULE | Freq: Two times a day (BID) | ORAL | 0 refills | Status: AC
Start: 2020-03-16 — End: 2020-03-30

## 2020-03-16 MED ORDER — FERROUS SULFATE 325 (65 FE) MG PO TABS: 325.0000 mg | ORAL_TABLET | Freq: Two times a day (BID) | ORAL | 0 refills | Status: DC

## 2020-03-16 MED ORDER — ONDANSETRON 4 MG PO TBDP
4.0000 mg | ORAL_TABLET | Freq: Three times a day (TID) | ORAL | 0 refills | Status: DC | PRN
Start: 2020-03-16 — End: 2020-05-08

## 2020-03-16 MED ORDER — AMOXICILLIN-POT CLAVULANATE 875-125 MG PO TABS: 1.0000 | ORAL_TABLET | Freq: Two times a day (BID) | ORAL | 0 refills | Status: AC

## 2020-03-16 NOTE — Discharge Instructions (Signed)

## 2020-03-16 NOTE — Discharge Summary (Signed)
Physician Discharge Summary  Stephanie [name] QAS:[id number] DOB: [birth year] DOA: 03/11/2020  PCP: Patient, [name]  Admit date: 03/11/2020 Discharge date: 03/16/2020  Admitted From: Home Disposition: Home   Recommendations for Outpatient Follow-up:  1. Follow up with PCP in 1-2 weeks 2. Please obtain BMP/CBC in one week your next doctors visit.  3. Oral Augmentin prescribed, she will also take probiotics towards the end of her treatment course 4. Iron supplements, bowel regimen prescribed 5. Will need GI follow-up for colonoscopy in about 6 weeks. Referral given. 6. Repeat MRI abdomen with PCP in 3 months.   Discharge Condition: Stable CODE STATUS: Full code Diet recommendation: GI soft  Brief/Interim Summary: 39 year old female with no known past medical history presented to the hospital with complaints of abdominal pain.  CT scan showed concerns of rectal carcinoma with some perforation, 2 cm right hepatic lobe liver lesion and large stool burden.  Attempts of ultrasound-guided biopsy of liver lesion was performed but ultrasound showed concerns of possible hemangioma therefore MRI abdomen was ordered.  CEA levels were very minimally elevated. MRI showed concerns for hemangioma but no obvious mass but difficult to evaluate given iron deposition from IV Feraheme. Recommended 70-month repeat MRI. She will need to complete oral course of Augmentin followed by Florastor eventually in about 6-8 weeks she will need outpatient colonoscopy. Stable for discharge. Extensively spoke with her mother regarding care.   Assessment & Plan:   Principal Problem:   Peritonitis with abscess of intestine (HCC) Active Problems:   Microcytic anemia   Colorectal carcinoma (HCC)  Abdominal pain concerns for intra-abdominal infection versus peritonitis Diverticulitis with possible abscess -Continue IV antibiotics-Zosyn-discharged on 8 days of oral Augmentin. Zofran as needed. Difficulty need MRI  given patient received IV Feraheme.  Will need repeat MRI in 3 months For now we will plan on treating this as complicated diverticulitis with possible abscess.  Outpatient follow-up with GI in about 6--8 weeks for a colonoscopy. -CEA levels high normal.  Constipation -Bowel regimen  Right hepatic lobe liver lesion-likely hemangioma -Initially thought to be a liver mass but ultrasound concerning for hemangioma.  MRI with contrast ordered-difficult to read given recent IV Feraheme.  Repeat in 3 months  Hyperkalemia -Repletion ordered  Iron deficiency/microcytic anemia -IV iron 6/14, p.o. iron with bowel regimen   Discharge Diagnoses:  Principal Problem:   Peritonitis with abscess of intestine ([place]) Active Problems:   Microcytic anemia   Colorectal carcinoma ([place])    Consultations:  General surgery  Subjective: Feels okay no complaints  Discharge Exam: Vitals:   03/15/20 2036 03/16/20 0505  BP: 125/75 119/71  Pulse: 70 67  Resp: 18 18  Temp: 99 F (37.2 C) 98.7 F (37.1 C)  SpO2: 96% 96%   Vitals:   03/15/20 1325 03/15/20 1955 03/15/20 2036 03/16/20 0505  BP: (!) 94/50 (!) 124/55 125/75 119/71  Pulse: 78 70 70 67  Resp:  18 18 18   Temp: 98.3 F (36.8 C) 99.1 F (37.3 C) 99 F (37.2 C) 98.7 F (37.1 C)  TempSrc: Oral Oral Oral Oral  SpO2: 100% 97% 96% 96%  Weight:      Height:        General: Pt is alert, awake, not in acute distress Cardiovascular: RRR, S1/S2 +, no rubs, no gallops Respiratory: CTA bilaterally, no wheezing, no rhonchi Abdominal: Soft, NT, ND, bowel sounds + Extremities: no edema, no cyanosis  Discharge Instructions   Allergies as of 03/16/2020      Reactions  Erythromycin       Medication List    STOP taking these medications   ciprofloxacin 500 MG tablet Commonly known as: Cipro   metroNIDAZOLE 500 MG tablet Commonly known as: FLAGYL     TAKE these medications   amoxicillin-clavulanate 875-125 MG tablet Commonly  known as: Augmentin Take 1 tablet by mouth every 12 (twelve) hours for 8 days.   busPIRone 10 MG tablet Commonly known as: BUSPAR Take 10 mg by mouth daily as needed (anxiety).   ferrous sulfate 325 (65 FE) MG tablet Take 1 tablet (325 mg total) by mouth 2 (two) times daily with a meal.   ibuprofen 200 MG tablet Commonly known as: ADVIL Take 200 mg by mouth every 6 (six) hours as needed for headache or moderate pain.   ondansetron 4 MG disintegrating tablet Commonly known as: Zofran ODT Take 1 tablet (4 mg total) by mouth every 8 (eight) hours as needed for nausea or vomiting.   PARAGARD INTRAUTERINE COPPER IU 1 Device by Intrauterine route once.   polyethylene glycol 17 g packet Commonly known as: MIRALAX / GLYCOLAX Take 17 g by mouth daily. Start taking on: March 17, 2020   saccharomyces boulardii 250 MG capsule Commonly known as: Florastor Take 1 capsule (250 mg total) by mouth 2 (two) times daily for 14 days.   senna-docusate 8.6-50 MG tablet Commonly known as: Senokot-S Take 2 tablets by mouth 2 (two) times daily.       Follow-up Information    [facility] Follow [facility].   Specialty: General Surgery Why: Our office is working to schedule follow up with a colorectal surgeon after colonoscopy. They should call with appointment date/time (should be 8-10 weeks from discharge). Please call with questions or concerns in the meantime.  Contact information: [place] [address] [phone]        [facility]. Call.   Why: Please follow up for PCP appointment. Contact information: [place] [address] [phone]       [facility]. Schedule an appointment as soon as possible for a visit in 6 week(s).   Specialty: Gastroenterology Why: Will need Colonoscopy post Colitis Contact information: [place]  [phone]             Allergies  Allergen Reactions  . Erythromycin     You were cared for by a hospitalist during your hospital stay. If you have any questions about your discharge medications or the care you received while you were in the hospital after you are discharged, you can call the unit and asked to speak with the hospitalist on call if the hospitalist that took care of you is not available. Once you are discharged, your primary care physician will handle any further medical issues. Please note that no refills for any discharge medications will be authorized once you are discharged, as it is imperative that you return to your primary care physician (or establish a relationship with a primary care physician if you do not have one) for your aftercare needs so that they can reassess your need for medications and monitor your lab values.   Procedures/Studies: MR ABDOMEN W WO CONTRAST  Result Date: 03/14/2020 CLINICAL DATA:  39 year old female with history of liver lesion noted on prior CT examination. Follow-up study. EXAM: MRI ABDOMEN WITHOUT AND WITH CONTRAST TECHNIQUE: Multiplanar multisequence MR imaging of the abdomen was performed both before  and after the administration of intravenous contrast. CONTRAST:  86mL GADAVIST GADOBUTROL 1 MMOL/ML IV SOLN COMPARISON:  No prior abdominal MRI. CT the abdomen and pelvis 03/12/2020. FINDINGS: Comment: Today's study is limited by recent administration of Feraheme on 03/12/2020. This grossly alters the signal characteristics of both the liver and splenic parenchyma, and completely compromises interpretation of signal intensity on post gadolinium imaging. Lower chest: Unremarkable. Hepatobiliary: Diffuse low signal intensity throughout the hepatic parenchyma on T2 weighted images, presumably a consequence of recent Feraheme injection. In segment 7 of the liver (axial image 8 of series 5) there is a 2.5 x 1.9 cm well-defined lesion  which is slightly T2 hyperintense. This lesion appears hyperintense on pre gadolinium T1 weighted images (likely a consequence of Feraheme). Interpretation of enhancement within the lesion is compromised by presence of Feraheme. No other hepatic lesions are confidently identified on today's examination. No intra or extrahepatic biliary ductal dilatation. Gallbladder is normal in appearance. Pancreas: No pancreatic mass or peripancreatic fluid collections or inflammatory changes. Spleen: Diffuse low signal intensity throughout the spleen on T2 weighted images, presumably consequence of Feraheme injection. Adrenals/Urinary Tract: Bilateral kidneys and adrenal glands are normal in appearance. No hydroureteronephrosis in the visualized portions of the abdomen. Stomach/Bowel: Unremarkable. Vascular/Lymphatic: No aneurysm identified in the visualized abdominal vasculature. No lymphadenopathy noted in the abdomen. Other: No significant volume of ascites noted in the visualized portions of the peritoneal cavity. Musculoskeletal: Diffuse low signal intensity throughout the marrow containing spaces, presumably a consequence of recent Feraheme injection. No aggressive appearing osseous lesions are noted in the visualized portions of the skeleton. IMPRESSION: 1. Limited study secondary to recent Feraheme injection. The lesion of concern in segment 7 of the liver has indeterminate imaging characteristics on today's examination. Given the appearance on prior ultrasound examination, this may simply represent a cavernous hemangioma. However, repeat abdominal MRI with and without IV gadolinium is recommended in 3 months (after clearance of Feraheme) to re-evaluate this lesion, both to ensure stability and definitive characterization. Electronically Signed   By: Vinnie Langton M.D.   On: 03/14/2020 09:27   CT ABDOMEN PELVIS W CONTRAST  Result Date: 03/12/2020 CLINICAL DATA:  Abdominal pain.  Concern for diverticulitis. EXAM: CT  ABDOMEN AND PELVIS WITH CONTRAST TECHNIQUE: Multidetector CT imaging of the abdomen and pelvis was performed using the standard protocol following bolus administration of intravenous contrast. CONTRAST:  151mL OMNIPAQUE IOHEXOL 300 MG/ML  SOLN COMPARISON:  CT dated 07/15/2018. FINDINGS: Lower chest: The lung bases are clear. The heart size is normal. Hepatobiliary: There is a new 2.5 cm mass in the right hepatic lobe (axial series 2, image 17). Normal gallbladder.There is no biliary ductal dilation. Pancreas: Normal contours without ductal dilatation. No peripancreatic fluid collection. Spleen: Unremarkable. Adrenals/Urinary Tract: --Adrenal glands: Unremarkable. --Right kidney/ureter: No hydronephrosis or radiopaque kidney stones. --Left kidney/ureter: No hydronephrosis or radiopaque kidney stones. --Urinary bladder: Unremarkable. Stomach/Bowel: --Stomach/Duodenum: No hiatal hernia or other gastric abnormality. Normal duodenal course and caliber. --Small bowel: Unremarkable. --Colon: There is a large amount of stool in the colon. There is extensive wall thickening of the sigmoid colon with adjacent inflammatory changes. There are apparent pockets of extraluminal gas and a possible extraluminal air and fluid collection concerning for an abscess. This is best appreciated on the coronal series. There are enlarged adjacent lymph nodes. There is suggestion of a phlegmonous collection abutting the sigmoid colon and uterus that has not yet coalesced into an abscess. This collection measures approximately 6.7 x 3.3 cm. --Appendix: Normal.  Vascular/Lymphatic: Normal course and caliber of the major abdominal vessels. --No retroperitoneal lymphadenopathy. --there are multiple enlarged lymph nodes adjacent to the presumed sigmoid mass. There are enlarged superior rectal lymph nodes. --there are no enlarged inguinal lymph nodes. Reproductive: An IUD is in place. There are enlarged pelvic veins which can be seen in patients with  pelvic congestion syndrome. Other: There is a small amount of free fluid in the patient's pelvis. There is a small fat containing umbilical hernia. Musculoskeletal. No acute displaced fractures. IMPRESSION: 1. Overall findings are highly concerning for colorectal carcinoma involving the sigmoid colon with an associated perforation and adjacent abscess and phlegmon formation as detailed above. Currently, no collection is amenable to percutaneous drainage given their small size and location. 2. New 2 cm mass in the right hepatic lobe concerning for metastatic disease to the liver until proven otherwise. 3. Enlarged regional lymph nodes as detailed above is concerning for nodal metastatic disease. 4. Large stool burden. 5. Prominent pelvic veins which can be seen in patients with pelvic congestion syndrome. These results were called by telephone at the time of interpretation on 03/12/2020 at 2:35 am to provider [facility] , who verbally acknowledged these results. Electronically Signed   By: Constance Holster M.D.   On: 03/12/2020 02:40   US Abdomen Limited  Result Date: 03/13/2020 CLINICAL DATA:  39 year old female with a perforated sigmoid colon and possible concern for underlying colorectal adenocarcinoma. Additionally, she had a lesion on her CT scan of the abdomen concerning for possible metastasis. She presents today for attempted ultrasound-guided biopsy of the same. EXAM: ULTRASOUND ABDOMEN LIMITED COMPARISON:  CT abdomen/pelvis 03/12/2020 FINDINGS: Ultrasound evaluation of the liver demonstrates a 2.1 x 2.4 x 2.0 cm lobular echogenic lesion in the right hepatic dome. The liver is otherwise unremarkable. No evidence of biliary ductal dilatation. IMPRESSION: Approximately 2.1 x 2.4 x 2.0 cm lobular homogeneously echogenic lesion in the right hepatic dome corresponds with the abnormality seen on the prior CT scan. Sonographically, this appearance is highly suggestive of a benign hemangioma. Given the  slightly elevated risk of bleeding complication when biopsying a hemangioma, further imaging is recommended prior to proceeding with biopsy. Recommend MRI of the abdomen with gadolinium contrast which may provide a noninvasive diagnosis of benign hemangioma. If the MRI remains concerning for metastatic disease, or is indeterminate, then ultrasound-guided core biopsy should be pursued. Signed, Criselda Peaches, MD, [facility] Electronically Signed   By: Jacqulynn Cadet M.D.   On: 03/13/2020 12:28      The results of significant diagnostics from this hospitalization (including imaging, microbiology, ancillary and laboratory) are listed below for reference.     Microbiology: Recent Results (from the past 240 hour(s))  Blood culture (routine x 2)     Status: None (Preliminary result)   Collection Time: 03/12/20  3:01 AM   Specimen: BLOOD RIGHT HAND  Result Value Ref Range Status   Specimen Description   Final    BLOOD RIGHT HAND Performed at [facility], [place] [address], [place], [place] [address]    Special Requests   Final    BOTTLES DRAWN AEROBIC ONLY Blood Culture adequate volume Performed at [place] [address], [place], [place] [address]    Culture   Final    NO GROWTH 4 DAYS Performed at [facility], [place] [address], [place], [place] [address]    Report Status PENDING  Incomplete  Blood  culture (routine x 2)     Status: None (Preliminary result)   Collection Time: 03/12/20  3:01 AM   Specimen: BLOOD  Result Value Ref Range Status   Specimen Description   Final    BLOOD LEFT ARM Performed at [facility] [address], [place], [place] [address]    Special Requests   Final    BOTTLES DRAWN AEROBIC AND ANAEROBIC Blood Culture adequate volume Performed at [facility] [address] [place], [place] [address]     Culture   Final    NO GROWTH 4 DAYS Performed at [facility], [place] [address] [place], [place] [address]    Report Status PENDING  Incomplete  SARS Coronavirus 2 by RT PCR (hospital order, performed in [facility] hospital lab) Nasopharyngeal Nasopharyngeal Swab     Status: None   Collection Time: 03/12/20  3:32 AM   Specimen: Nasopharyngeal Swab  Result Value Ref Range Status   SARS Coronavirus 2 NEGATIVE NEGATIVE Final    Comment: (NOTE) SARS-CoV-2 target nucleic acids are NOT DETECTED.  The SARS-CoV-2 RNA is generally detectable in upper and lower respiratory specimens during the acute phase of infection. The lowest concentration of SARS-CoV-2 viral copies this assay can detect is 250 copies / mL. A negative result does not preclude SARS-CoV-2 infection and should not be used as the sole basis for treatment or other patient management decisions.  A negative result may occur with improper specimen collection / handling, submission of specimen other than nasopharyngeal swab, presence of viral mutation(s) within the areas targeted by this assay, and inadequate number of viral copies (<250 copies / mL). A negative result must be combined with clinical observations, patient history, and epidemiological information.  Fact Sheet for Patients:   StrictlyIdeas.no  Fact Sheet for Healthcare Providers: BankingDealers.co.za  This test is not yet approved or  cleared by the [place] FDA and has been authorized for detection and/or diagnosis of SARS-CoV-2 by FDA under an Emergency Use Authorization (EUA).  This EUA will remain in effect (meaning this test can be used) for the duration of the COVID-19 declaration under Section 564(b)(1) of the Act, 21 U.S.C. section 360bbb-3(b)(1), unless the authorization is terminated or revoked sooner.  Performed at [facility], [place] [address], [place], [place] [address]       Labs: BNP (last 3 results) No results for input(s): BNP in the last 8760 hours. Basic Metabolic Panel: Recent Labs  Lab 03/11/20 2135 03/12/20 0615 03/13/20 0456 03/14/20 0519 03/15/20 0453 03/16/20 0455  NA 139 140 137 139 138  --   K 4.0 3.5 3.1* 3.5 3.5  --   CL 102 106 103 108 107  --   CO2 25 24 25 22 23   --   GLUCOSE 92 104* 100* 74 89  --   BUN 10 6 6 7  <5*  --   CREATININE 0.72 0.65 0.55 0.56 0.56  --   CALCIUM 8.6* 7.8* 7.9* 8.3* 8.1*  --   MG  --   --  1.9 1.9 1.9 1.9   Liver Function Tests: Recent Labs  Lab 03/11/20 2135 03/12/20 0615 03/13/20 0456 03/14/20 0519 03/15/20 0453  AST 13* 10* 9* 10* 10*  ALT 9 11 11 11 11   ALKPHOS 48 42 45 46 43  BILITOT 0.6 0.6 1.0 0.8 0.5  PROT 7.0 6.1* 5.8* 5.7* 5.7*  ALBUMIN 3.4* 3.0* 2.8* 2.6* 2.6*   Recent Labs  Lab  03/11/20 2135  LIPASE 24   No results for input(s): AMMONIA in the last 168 hours. CBC: Recent Labs  Lab 03/12/20 0615 03/13/20 0456 03/14/20 0519 03/15/20 0453 03/16/20 0455  WBC 10.6* 12.8* 10.2 7.1 8.3  NEUTROABS 7.8* 10.3*  --   --   --   HGB 9.1* 8.6* 8.7* 8.1* 8.5*  HCT 30.5* 28.0* 28.9* 26.3* 27.6*  MCV 78.6* 77.1* 78.1* 77.8* 77.7*  PLT 206 186 203 210 246   Cardiac Enzymes: No results for input(s): CKTOTAL, CKMB, CKMBINDEX, TROPONINI in the last 168 hours. BNP: Invalid input(s): POCBNP CBG: No results for input(s): GLUCAP in the last 168 hours. D-Dimer No results for input(s): DDIMER in the last 72 hours. Hgb A1c No results for input(s): HGBA1C in the last 72 hours. Lipid Profile No results for input(s): CHOL, HDL, LDLCALC, TRIG, CHOLHDL, LDLDIRECT in the last 72 hours. Thyroid function studies No results for input(s): TSH, T4TOTAL, T3FREE, THYROIDAB in the last 72 hours.  Invalid input(s): FREET3 Anemia work up No results for input(s): VITAMINB12, FOLATE, FERRITIN, TIBC, IRON, RETICCTPCT in the last 72 hours. Urinalysis    Component Value Date/Time   COLORURINE  YELLOW 03/12/2020 0037   APPEARANCEUR CLEAR 03/12/2020 0037   LABSPEC 1.015 03/12/2020 0037   PHURINE 6.0 03/12/2020 0037   GLUCOSEU NEGATIVE 03/12/2020 0037   HGBUR SMALL (A) 03/12/2020 0037   BILIRUBINUR NEGATIVE 03/12/2020 0037   KETONESUR NEGATIVE 03/12/2020 0037   PROTEINUR NEGATIVE 03/12/2020 0037   UROBILINOGEN 0.2 09/05/2018 1453   NITRITE NEGATIVE 03/12/2020 0037   LEUKOCYTESUR MODERATE (A) 03/12/2020 0037   Sepsis Labs Invalid input(s): PROCALCITONIN,  WBC,  LACTICIDVEN Microbiology Recent Results (from the past 240 hour(s))  Blood culture (routine x 2)     Status: None (Preliminary result)   Collection Time: 03/12/20  3:01 AM   Specimen: BLOOD RIGHT HAND  Result Value Ref Range Status   Specimen Description   Final    BLOOD RIGHT HAND Performed at [facility], [place] [address], [place], [place] [address]    Special Requests   Final    BOTTLES DRAWN AEROBIC ONLY Blood Culture adequate volume Performed at [facility] [address], [place], [place] [address]    Culture   Final    NO GROWTH 4 DAYS Performed at [facility], [place] [address] [place], [place] [address]    Report Status PENDING  Incomplete  Blood culture (routine x 2)     Status: None (Preliminary result)   Collection Time: 03/12/20  3:01 AM   Specimen: BLOOD  Result Value Ref Range Status   Specimen Description   Final    BLOOD LEFT ARM Performed at [facility] [address] [place], [place] [address]    Special Requests   Final    BOTTLES DRAWN AEROBIC AND ANAEROBIC Blood Culture adequate volume Performed at [facility] [address] [place], [place] [address]    Culture   Final    NO GROWTH 4 DAYS Performed at [facility], [place] [address], [place], [place] [address]    Report Status PENDING  Incomplete  SARS Coronavirus 2 by RT PCR (hospital order, performed in [facility] hospital lab)  Nasopharyngeal Nasopharyngeal Swab     Status: None   Collection Time: 03/12/20  3:32 AM   Specimen: Nasopharyngeal Swab  Result Value Ref Range Status   SARS Coronavirus 2 NEGATIVE NEGATIVE Final    Comment: (  NOTE) SARS-CoV-2 target nucleic acids are NOT DETECTED.  The SARS-CoV-2 RNA is generally detectable in upper and lower respiratory specimens during the acute phase of infection. The lowest concentration of SARS-CoV-2 viral copies this assay can detect is 250 copies / mL. A negative result does not preclude SARS-CoV-2 infection and should not be used as the sole basis for treatment or other patient management decisions.  A negative result may occur with improper specimen collection / handling, submission of specimen other than nasopharyngeal swab, presence of viral mutation(s) within the areas targeted by this assay, and inadequate number of viral copies (<250 copies / mL). A negative result must be combined with clinical observations, patient history, and epidemiological information.  Fact Sheet for Patients:   StrictlyIdeas.no  Fact Sheet for Healthcare Providers: BankingDealers.co.za  This test is not yet approved or  cleared by the [place] FDA and has been authorized for detection and/or diagnosis of SARS-CoV-2 by FDA under an Emergency Use Authorization (EUA).  This EUA will remain in effect (meaning this test can be used) for the duration of the COVID-19 declaration under Section 564(b)(1) of the Act, 21 U.S.C. section 360bbb-3(b)(1), unless the authorization is terminated or revoked sooner.  Performed at [facility], [place] [address], [place], [place] [address]      Time coordinating discharge:  I have spent 35 minutes face to face with the patient and on the ward discussing the patients care, assessment, plan and disposition with other care givers. >50% of the time was devoted counseling the  patient about the risks and benefits of treatment/Discharge disposition and coordinating care.   SIGNED:   Damita Lack, MD  [facility] 03/16/2020, 11:34 AM   If 7PM-7AM, please contact night-coverage

## 2020-03-16 NOTE — Plan of Care (Signed)
  Problem: Activity: Goal: Risk for activity intolerance will decrease Outcome: Progressing   Problem: Pain Managment: Goal: General experience of comfort will improve Outcome: Progressing   

## 2020-03-16 NOTE — Progress Notes (Signed)
Discharge instructions given to patient and all questions were answered.  

## 2020-03-17 LAB — CULTURE, BLOOD (ROUTINE X 2)
Culture: NO GROWTH
Culture: NO GROWTH
Special Requests: ADEQUATE
Special Requests: ADEQUATE

## 2020-03-29 DIAGNOSIS — C189 Malignant neoplasm of colon, unspecified: Secondary | ICD-10-CM

## 2020-03-29 HISTORY — PX: COLECTOMY: SHX59

## 2020-03-29 HISTORY — DX: Malignant neoplasm of colon, unspecified: C18.9

## 2020-03-31 ENCOUNTER — Emergency Department (HOSPITAL_COMMUNITY): Payer: Medicaid Other

## 2020-03-31 ENCOUNTER — Encounter (HOSPITAL_COMMUNITY): Payer: Self-pay | Admitting: Emergency Medicine

## 2020-03-31 ENCOUNTER — Inpatient Hospital Stay (HOSPITAL_COMMUNITY)
Admission: EM | Admit: 2020-03-31 | Discharge: 2020-04-16 | DRG: 853 | Disposition: A | Discharge status: Home Health | Payer: Medicaid Other | Attending: Family Medicine | Admitting: Family Medicine

## 2020-03-31 ENCOUNTER — Other Ambulatory Visit: Payer: Self-pay

## 2020-03-31 DIAGNOSIS — E739 Lactose intolerance, unspecified: Secondary | ICD-10-CM | POA: Diagnosis present

## 2020-03-31 DIAGNOSIS — K651 Peritoneal abscess: Secondary | ICD-10-CM | POA: Diagnosis present

## 2020-03-31 DIAGNOSIS — R112 Nausea with vomiting, unspecified: Secondary | ICD-10-CM | POA: Diagnosis not present

## 2020-03-31 DIAGNOSIS — E876 Hypokalemia: Secondary | ICD-10-CM | POA: Diagnosis present

## 2020-03-31 DIAGNOSIS — K631 Perforation of intestine (nontraumatic): Secondary | ICD-10-CM | POA: Diagnosis present

## 2020-03-31 DIAGNOSIS — R339 Retention of urine, unspecified: Secondary | ICD-10-CM | POA: Diagnosis present

## 2020-03-31 DIAGNOSIS — Z20822 Contact with and (suspected) exposure to covid-19: Secondary | ICD-10-CM | POA: Diagnosis present

## 2020-03-31 DIAGNOSIS — C189 Malignant neoplasm of colon, unspecified: Secondary | ICD-10-CM

## 2020-03-31 DIAGNOSIS — E8770 Fluid overload, unspecified: Secondary | ICD-10-CM | POA: Diagnosis present

## 2020-03-31 DIAGNOSIS — D62 Acute posthemorrhagic anemia: Secondary | ICD-10-CM | POA: Diagnosis present

## 2020-03-31 DIAGNOSIS — E440 Moderate protein-calorie malnutrition: Secondary | ICD-10-CM | POA: Diagnosis present

## 2020-03-31 DIAGNOSIS — K630 Abscess of intestine: Secondary | ICD-10-CM | POA: Diagnosis present

## 2020-03-31 DIAGNOSIS — D509 Iron deficiency anemia, unspecified: Secondary | ICD-10-CM | POA: Diagnosis present

## 2020-03-31 DIAGNOSIS — C187 Malignant neoplasm of sigmoid colon: Secondary | ICD-10-CM | POA: Diagnosis not present

## 2020-03-31 DIAGNOSIS — D630 Anemia in neoplastic disease: Secondary | ICD-10-CM | POA: Diagnosis present

## 2020-03-31 DIAGNOSIS — A419 Sepsis, unspecified organism: Secondary | ICD-10-CM | POA: Diagnosis present

## 2020-03-31 DIAGNOSIS — J90 Pleural effusion, not elsewhere classified: Secondary | ICD-10-CM | POA: Diagnosis present

## 2020-03-31 DIAGNOSIS — C19 Malignant neoplasm of rectosigmoid junction: Secondary | ICD-10-CM | POA: Diagnosis present

## 2020-03-31 DIAGNOSIS — Z6825 Body mass index (BMI) 25.0-25.9, adult: Secondary | ICD-10-CM

## 2020-03-31 DIAGNOSIS — Z87891 Personal history of nicotine dependence: Secondary | ICD-10-CM

## 2020-03-31 DIAGNOSIS — N7093 Salpingitis and oophoritis, unspecified: Secondary | ICD-10-CM | POA: Diagnosis present

## 2020-03-31 DIAGNOSIS — K5900 Constipation, unspecified: Secondary | ICD-10-CM | POA: Diagnosis present

## 2020-03-31 DIAGNOSIS — Z975 Presence of (intrauterine) contraceptive device: Secondary | ICD-10-CM | POA: Diagnosis not present

## 2020-03-31 DIAGNOSIS — R1110 Vomiting, unspecified: Secondary | ICD-10-CM

## 2020-03-31 DIAGNOSIS — A411 Sepsis due to other specified staphylococcus: Secondary | ICD-10-CM | POA: Diagnosis not present

## 2020-03-31 DIAGNOSIS — C801 Malignant (primary) neoplasm, unspecified: Secondary | ICD-10-CM

## 2020-03-31 DIAGNOSIS — R0602 Shortness of breath: Secondary | ICD-10-CM

## 2020-03-31 LAB — HCG, SERUM, QUALITATIVE: Preg, Serum: NEGATIVE

## 2020-03-31 LAB — I-STAT BETA HCG BLOOD, ED (MC, WL, AP ONLY): I-stat hCG, quantitative: 11.3 m[IU]/mL — ABNORMAL HIGH (ref <5)

## 2020-03-31 LAB — CBC
HCT: 41 % (ref 36.0–46.0)
Hemoglobin: 12.6 g/dL (ref 12.0–15.0)
MCH: 25.2 pg — ABNORMAL LOW (ref 26.0–34.0)
MCHC: 30.7 g/dL (ref 30.0–36.0)
MCV: 82 fL (ref 80.0–100.0)
Platelets: 225 10*3/uL (ref 150–400)
RBC: 5 MIL/uL (ref 3.87–5.11)
RDW: 22 % — ABNORMAL HIGH (ref 11.5–15.5)
WBC: 23.2 10*3/uL — ABNORMAL HIGH (ref 4.0–10.5)
nRBC: 0 % (ref 0.0–0.2)

## 2020-03-31 LAB — URINALYSIS, ROUTINE W REFLEX MICROSCOPIC
Bacteria, UA: NONE SEEN
Bilirubin Urine: NEGATIVE
Glucose, UA: NEGATIVE mg/dL
Ketones, ur: 20 mg/dL — AB
Nitrite: NEGATIVE
Protein, ur: NEGATIVE mg/dL
Specific Gravity, Urine: >1.046 — ABNORMAL HIGH (ref 1.005–1.030)
pH: 5 (ref 5.0–8.0)

## 2020-03-31 LAB — COMPREHENSIVE METABOLIC PANEL
ALT: 17 U/L (ref 0–44)
AST: 21 U/L (ref 15–41)
Albumin: 3.9 g/dL (ref 3.5–5.0)
Alkaline Phosphatase: 68 U/L (ref 38–126)
Anion gap: 13 (ref 5–15)
BUN: 9 mg/dL (ref 6–20)
CO2: 20 mmol/L — ABNORMAL LOW (ref 22–32)
Calcium: 8.8 mg/dL — ABNORMAL LOW (ref 8.9–10.3)
Chloride: 101 mmol/L (ref 98–111)
Creatinine, Ser: 0.9 mg/dL (ref 0.44–1.00)
GFR calc Af Amer: >60 mL/min (ref >60)
GFR calc non Af Amer: >60 mL/min (ref >60)
Glucose, Bld: 114 mg/dL — ABNORMAL HIGH (ref 70–99)
Potassium: 3.7 mmol/L (ref 3.5–5.1)
Sodium: 134 mmol/L — ABNORMAL LOW (ref 135–145)
Total Bilirubin: 1.2 mg/dL (ref 0.3–1.2)
Total Protein: 7.9 g/dL (ref 6.5–8.1)

## 2020-03-31 LAB — LACTIC ACID, PLASMA: Lactic Acid, Venous: 1 mmol/L (ref 0.5–1.9)

## 2020-03-31 LAB — SARS CORONAVIRUS 2 BY RT PCR (HOSPITAL ORDER, PERFORMED IN ~~LOC~~ HOSPITAL LAB): SARS Coronavirus 2: NEGATIVE

## 2020-03-31 LAB — LIPASE, BLOOD: Lipase: 18 U/L (ref 11–51)

## 2020-03-31 LAB — APTT: aPTT: 31 seconds (ref 24–36)

## 2020-03-31 LAB — PROTIME-INR
INR: 1.3 — ABNORMAL HIGH (ref 0.8–1.2)
Prothrombin Time: 15.2 seconds (ref 11.4–15.2)

## 2020-03-31 MED ORDER — HYDROMORPHONE HCL 1 MG/ML IJ SOLN
1.0000 mg | Freq: Once | INTRAMUSCULAR | Status: AC
  Administered 2020-03-31: 1 mg via INTRAVENOUS
  Filled 2020-03-31: qty 1

## 2020-03-31 MED ORDER — METRONIDAZOLE IN NACL 5-0.79 MG/ML-% IV SOLN
500.0000 mg | Freq: Three times a day (TID) | INTRAVENOUS | Status: DC
  Administered 2020-03-31 – 2020-04-10 (×29): 500 mg via INTRAVENOUS
  Filled 2020-03-31 (×29): qty 100

## 2020-03-31 MED ORDER — ONDANSETRON HCL 4 MG/2ML IJ SOLN
4.0000 mg | Freq: Once | INTRAMUSCULAR | Status: AC
  Administered 2020-03-31: 4 mg via INTRAVENOUS
  Filled 2020-03-31: qty 2

## 2020-03-31 MED ORDER — ONDANSETRON HCL 4 MG/2ML IJ SOLN
4.0000 mg | Freq: Four times a day (QID) | INTRAMUSCULAR | Status: DC | PRN
  Administered 2020-04-02 – 2020-04-11 (×7): 4 mg via INTRAVENOUS
  Filled 2020-03-31 (×10): qty 2

## 2020-03-31 MED ORDER — SODIUM CHLORIDE 0.9 % IV SOLN: 2.0000 g | Freq: Once | INTRAVENOUS | Status: DC

## 2020-03-31 MED ORDER — FAMOTIDINE IN NACL 20-0.9 MG/50ML-% IV SOLN
20.0000 mg | Freq: Two times a day (BID) | INTRAVENOUS | Status: DC
  Administered 2020-03-31 – 2020-04-05 (×10): 20 mg via INTRAVENOUS
  Filled 2020-03-31 (×10): qty 50

## 2020-03-31 MED ORDER — IOHEXOL 300 MG/ML  SOLN
100.0000 mL | Freq: Once | INTRAMUSCULAR | Status: AC | PRN
  Administered 2020-03-31: 100 mL via INTRAVENOUS

## 2020-03-31 MED ORDER — SODIUM CHLORIDE 0.9 % IV BOLUS
1000.0000 mL | Freq: Once | INTRAVENOUS | Status: AC
  Administered 2020-03-31: 1000 mL via INTRAVENOUS

## 2020-03-31 MED ORDER — HYDROMORPHONE HCL 1 MG/ML IJ SOLN
0.5000 mg | INTRAMUSCULAR | Status: DC | PRN
  Administered 2020-03-31 – 2020-04-02 (×17): 1 mg via INTRAVENOUS
  Filled 2020-03-31 (×17): qty 1

## 2020-03-31 MED ORDER — LACTATED RINGERS IV SOLN: INTRAVENOUS | Status: DC

## 2020-03-31 MED ORDER — KETOROLAC TROMETHAMINE 30 MG/ML IJ SOLN
30.0000 mg | Freq: Once | INTRAMUSCULAR | Status: AC
  Administered 2020-03-31: 30 mg via INTRAVENOUS
  Filled 2020-03-31: qty 1

## 2020-03-31 MED ORDER — SODIUM CHLORIDE 0.9% FLUSH: 3.0000 mL | Freq: Once | INTRAVENOUS | Status: DC

## 2020-03-31 MED ORDER — SODIUM CHLORIDE (PF) 0.9 % IJ SOLN
INTRAMUSCULAR | Status: AC
  Filled 2020-03-31: qty 50

## 2020-03-31 MED ORDER — ONDANSETRON HCL 4 MG PO TABS
4.0000 mg | ORAL_TABLET | Freq: Four times a day (QID) | ORAL | Status: DC | PRN
  Administered 2020-04-10: 4 mg via ORAL
  Filled 2020-03-31: qty 1

## 2020-03-31 MED ORDER — SODIUM CHLORIDE 0.9 % IV SOLN
2.0000 g | Freq: Three times a day (TID) | INTRAVENOUS | Status: DC
  Administered 2020-03-31 – 2020-04-10 (×29): 2 g via INTRAVENOUS
  Filled 2020-03-31 (×31): qty 2

## 2020-03-31 MED ORDER — LACTATED RINGERS IV BOLUS (SEPSIS)
1000.0000 mL | Freq: Once | INTRAVENOUS | Status: AC
  Administered 2020-03-31: 1000 mL via INTRAVENOUS

## 2020-03-31 MED ORDER — METOCLOPRAMIDE HCL 5 MG/ML IJ SOLN
10.0000 mg | Freq: Once | INTRAMUSCULAR | Status: AC
  Administered 2020-03-31: 10 mg via INTRAVENOUS
  Filled 2020-03-31: qty 2

## 2020-03-31 MED ORDER — PIPERACILLIN-TAZOBACTAM 3.375 G IVPB 30 MIN
3.3750 g | Freq: Once | INTRAVENOUS | Status: AC
  Administered 2020-03-31: 3.375 g via INTRAVENOUS
  Filled 2020-03-31: qty 50

## 2020-03-31 NOTE — H&P (Addendum)
History and Physical    [name] AJO:[id number] DOB: Feb 17, 1981 DOA: 03/31/2020  PCP: Patient, [name]  Patient coming from: Home  I have personally briefly reviewed patient's old medical records in [place]  Chief Complaint: Lower abdominal pain  HPI: Stephanie [name] is a 39 y.o. female with medical history significant of recent admission from June 14 to June 18 with abdominal pain and abscess with a question of sigmoid colon malignancy versus diverticulitis.  At that time she had an abnormal CT scan which also showed a liver mass.  She had a barely elevated CEA at 4.9.  She was placed on IV antibiotics, an attempt was made at liver biopsy however ultrasound suggested it might be a hemangioma.  An MRI was equivocal.  She was sent home on Augmentin which she completed and was doing well with until the last few days.  She has continued to have blood in her stool.  Stools are soft and not fully formed.  She is passing gas.  She has had increasing nausea with emesis over the last 12 hours.  She reports ongoing lower abdominal cramping that is worse in the left lower quadrant.  Her pain was 10 out of 10 requiring IV Dilaudid in the ED.  She has an IUD in place, no sexual partner for the last 1 year.  She denies vaginal discharge, vaginal bleeding, dysuria, hematuria, chest pain, or shortness of breath.  ED Course: In the ED she was sent for CT of the abdomen and pelvis which showed enlarging sigmoid colonic mass which appears to be associated with focal perforation and has fistulized into the left adnexal region involving the left TOA.  There are multiple enlarged lymph nodes which are bigger than before measuring up to 1.2 cm.  The previously suspected metastatic lesion in the liver is also enlarged, small volume ascites.  Review of Systems: As per HPI otherwise 10 point review of systems negative.   Past Medical History:  Diagnosis Date  . BV (bacterial vaginosis)   .  Lactose intolerance 03/12/2020  . UTI (lower urinary tract infection)     Past Surgical History:  Procedure Laterality Date  . CESAREAN SECTION       reports that she has quit smoking. Her smoking use included cigarettes. She has a 5.00 pack-year smoking history. She has never used smokeless tobacco. She reports current alcohol use. She reports that she does not use drugs.  No Known Allergies  Family History  Problem Relation Age of Onset  . Heart disease Father   . Colon cancer Neg Hx   . Esophageal cancer Neg Hx     Prior to Admission medications   Medication Sig Start Date End Date Taking? Authorizing Provider  ferrous sulfate 325 (65 FE) MG tablet Take 1 tablet (325 mg total) by mouth 2 (two) times daily with a meal. 03/16/20  Yes Amin, Jeanella Flattery, MD  PARAGARD INTRAUTERINE COPPER IU 1 Device by Intrauterine route once.    Yes [provider]  polyethylene glycol (MIRALAX / GLYCOLAX) 17 g packet Take 17 g by mouth daily. 03/17/20  Yes Amin, Ankit Chirag, MD  senna-docusate (SENOKOT-S) 8.6-50 MG tablet Take 2 tablets by mouth 2 (two) times daily. 03/16/20  Yes Amin, Ankit Chirag, MD  ondansetron (ZOFRAN ODT) 4 MG disintegrating tablet Take 1 tablet (4 mg total) by mouth every 8 (eight) hours as needed for nausea or vomiting. 03/16/20   Damita Lack, MD  Physical Exam: Vitals:   03/31/20 1201 03/31/20 1216 03/31/20 1245 03/31/20 1340  BP: 126/79  127/74 (!) 111/59  Pulse: 92 89 90 85  Resp:   20 20  Temp:      TempSrc:      SpO2: 99% 99% 95% 96%  Weight:      Height:        Constitutional: NAD, calm, comfortable Eyes: PERRL, lids and conjunctivae normal ENMT: Mucous membranes are moist. Posterior pharynx clear of any exudate or lesions.Normal dentition.  Neck: normal, supple, no masses, no thyromegaly Respiratory: clear to auscultation bilaterally, no wheezing, no crackles. Normal respiratory effort. No accessory muscle use.  Cardiovascular: Regular rate  and rhythm, no murmurs / rubs / gallops. No extremity edema. 2+ pedal pulses. No carotid bruits.  Abdomen: no tenderness, no masses palpated. No hepatosplenomegaly. Bowel sounds positive.  Musculoskeletal: no clubbing / cyanosis. No joint deformity upper and lower extremities. Good ROM, no contractures. Normal muscle tone.  Skin: no rashes, lesions, ulcers. No induration Neurologic: CN 2-12 grossly intact. Sensation intact, DTR normal. Strength 5/5 in all 4.  Psychiatric: Normal judgment and insight. Alert and oriented x 3. Normal mood.   Labs on Admission: I have personally reviewed following labs and imaging studies  CBC: Recent Labs  Lab 03/31/20 0910  WBC 23.2*  HGB 12.6  HCT 41.0  MCV 82.0  PLT 017   Basic Metabolic Panel: Recent Labs  Lab 03/31/20 0910  NA 134*  K 3.7  CL 101  CO2 20*  GLUCOSE 114*  BUN 9  CREATININE 0.90  CALCIUM 8.8*   GFR: Estimated Creatinine Clearance: 87.7 mL/min (by C-G formula based on SCr of 0.9 mg/dL). Liver Function Tests: Recent Labs  Lab 03/31/20 0910  AST 21  ALT 17  ALKPHOS 68  BILITOT 1.2  PROT 7.9  ALBUMIN 3.9   Recent Labs  Lab 03/31/20 0910  LIPASE 18   Urine analysis:    Component Value Date/Time   COLORURINE YELLOW 03/12/2020 0037   APPEARANCEUR CLEAR 03/12/2020 0037   LABSPEC 1.015 03/12/2020 0037   PHURINE 6.0 03/12/2020 0037   GLUCOSEU NEGATIVE 03/12/2020 0037   HGBUR SMALL (A) 03/12/2020 0037   BILIRUBINUR NEGATIVE 03/12/2020 0037   KETONESUR NEGATIVE 03/12/2020 0037   PROTEINUR NEGATIVE 03/12/2020 0037   UROBILINOGEN 0.2 09/05/2018 1453   NITRITE NEGATIVE 03/12/2020 0037   LEUKOCYTESUR MODERATE (A) 03/12/2020 0037    Radiological Exams on Admission: CT ABDOMEN PELVIS W CONTRAST  Result Date: 03/31/2020 CLINICAL DATA:  39 year old female with history of left lower quadrant abdominal pain. EXAM: CT ABDOMEN AND PELVIS WITH CONTRAST TECHNIQUE: Multidetector CT imaging of the abdomen and pelvis was  performed using the standard protocol following bolus administration of intravenous contrast. CONTRAST:  149mL OMNIPAQUE IOHEXOL 300 MG/ML  SOLN COMPARISON:  CT the abdomen and pelvis 03/12/2020. FINDINGS: Lower chest: Unremarkable. 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 No other new hepatic lesions. No intra or extrahepatic biliary ductal dilatation. Gallbladder is normal in appearance. Pancreas: No pancreatic mass. No pancreatic ductal dilatation. No peripancreatic fluid collections or inflammatory changes. Spleen: Unremarkable. Adrenals/Urinary Tract: Bilateral kidneys and bilateral adrenal glands are unremarkable in appearance. No hydroureteronephrosis. Urinary bladder is normal in appearance. Stomach/Bowel: Normal appearance of the stomach. No pathologic dilatation of small bowel or colon. Previous the noted  lesion in the mid sigmoid colon appears larger than the prior examination estimated to measure approximately 6.7 x 3.2 x 4.4 cm (coronal image 48 of series 4 and sagittal image 78 of series 5). Adjacent to this there is a very complex collection of gas and fluid which is now intimately associated with the left adnexa measuring approximately 6.5 x 6.2 x 6.6 cm (axial image 75 of series 2 and coronal image 52 of series 4). This collection has fluid of heterogeneous attenuation and multiple internal locules of gas, as well as multiple areas of internal enhancement. This is associated with a serpiginous structure with peripheral enhancement in the left adnexal region, likely to represent dilated fallopian tube which also contains gas and fluid. Notably, this collection appears to communicate with the previously described colonic mass across the midline best appreciated on axial image 70 of series 2. Normal appendix.  Vascular/Lymphatic: No significant atherosclerotic disease, aneurysm or dissection noted in the abdominal or pelvic vasculature. Multiple enlarged lymph nodes in the pelvis measuring up to 1.2 cm in short axis adjacent to the inferior mesenteric artery (axial image 61 of series 2). Borderline enlarged retroperitoneal lymph nodes are also noted measuring up to 8 mm in short axis. Reproductive: IUD present in the uterus. Uterus and right ovary are otherwise unremarkable in appearance. Large complex gas and fluid collection intimately associated with the left adnexa (discussed above). Tubular serpiginous area with rim enhancement also noted in the left adnexal region. Other: Small volume of ascites.  No pneumoperitoneum. Musculoskeletal: There are no aggressive appearing lytic or blastic lesions noted in the visualized portions of the skeleton. IMPRESSION: 1. Previously noted sigmoid colon mass appears increased in size, and again appears to be associated with a focal contained perforation which crosses the midline and has fistulized into the left adnexal region where there is now what appears to be a large left tubo-ovarian abscess, as detailed above. This is also associated with multiple enlarged lymph nodes in the pelvis measuring up to 1.2 cm in short axis and borderline enlarged retroperitoneal lymph nodes, concerning for metastatic disease. In addition, previously suspected metastatic lesion in segment 7 of the liver has enlarged. 2. Small volume of ascites. 3. Additional incidental findings, as above. These results were called by telephone at the time of interpretation on 03/31/2020 at 11:58 am to provider [facility], who verbally acknowledged these results. Electronically Signed   By: Vinnie Langton M.D.   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  We still do not have a firm diagnosis.  I have reviewed this case with interventional radiology who does not think she has a good candidate for drain.  They said there is too much small bowel involved and they are worried about making things worse.  They prefer IV antibiotics and repeat scan in 48 to 72 hours if she has not improved.  Cefepime plus Flagyl for intra-abdominal abscess with presumed malignancy  Repeat CEA  IV hydration  N.p.o. for now since she has continued to get worse.  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  Reviewed with the patient and her mom today.   Reviewed with general surgery, with infection present would likely need a colostomy if surgery had to be done right now.  He would be better, to have a drain placed by IR and so that should be our goal if possible.   DVT prophylaxis: SCD/Compression stockings Code Status: Full code  Family Communication: Mom by phone, patient at bedside Disposition Plan: home  Consults called: IR, Gen Surgery  Admission status: Inpatient   Donnamae Jude MD [facility]  If 7PM-7AM, please contact night-coverage 03/31/2020, 1:47 PM

## 2020-03-31 NOTE — ED Provider Notes (Signed)
[facility] DEPT Provider Note   CSN: [id number] Arrival date & time: 03/31/20  4403     History Chief Complaint  Patient presents with  . Abdominal Pain    Stephanie [name] is a 39 y.o. female.  The history is provided by the patient and medical records. No language interpreter was used.  Abdominal Pain    39 year old female presenting for evaluation abdominal pain.  Patient report approximately 3 weeks ago she developed left lower abdominal pain in which she was hospitalized for 5 days for peritonitis with abscess of the intestine.  At that time CT scan also shows concerns for rectal carcinoma with some perforation.  Patient subsequently discharged home with Augmentin.  Patient states symptoms got better however for the past 3 days pain became much more intense.  Pain is sharp stabbing localized in left lower quadrant, persistent, with associated nausea and nonbloody nonbilious vomiting.  She also noticed trace blood in his stool this morning.  Pain is currently 10 out of 10.  History is limited as patient appears to be in moderate discomfort.  She does not complain of any chest pain or shortness of breath no dysuria no hematuria no vaginal bleeding or vaginal discharge.  Past Medical History:  Diagnosis Date  . BV (bacterial vaginosis)   . Lactose intolerance 03/12/2020  . UTI (lower urinary tract infection)     Patient Active Problem List   Diagnosis Date Noted  . Lactose intolerance 03/12/2020  . Microcytic anemia 03/12/2020  . Colorectal carcinoma ([place]) 03/12/2020  . Peritonitis with abscess of intestine ([place]) 03/12/2020    Past Surgical History:  Procedure Laterality Date  . CESAREAN SECTION       OB History    Gravida  3   Para  1   Term  1   Preterm      AB  1   Living  1     SAB      TAB  1   Ectopic      Multiple      Live Births              Family History  Problem Relation Age of Onset  . Heart  disease Father   . Colon cancer Neg Hx   . Esophageal cancer Neg Hx     Social History   Tobacco Use  . Smoking status: Former Smoker    Packs/day: 0.50    Years: 10.00    Pack years: 5.00    Types: Cigarettes  . Smokeless tobacco: Never Used  Vaping Use  . Vaping Use: Never used  Substance Use Topics  . Alcohol use: Yes    Comment: seldom  . Drug use: No    Home Medications Prior to Admission medications   Medication Sig Start Date End Date Taking? Authorizing Provider  busPIRone (BUSPAR) 10 MG tablet Take 10 mg by mouth daily as needed (anxiety).    [provider]  ferrous sulfate 325 (65 FE) MG tablet Take 1 tablet (325 mg total) by mouth 2 (two) times daily with a meal. 03/16/20   Amin, Jeanella Flattery, MD  ibuprofen (ADVIL) 200 MG tablet Take 200 mg by mouth every 6 (six) hours as needed for headache or moderate pain.    [provider]  ondansetron (ZOFRAN ODT) 4 MG disintegrating tablet Take 1 tablet (4 mg total) by mouth every 8 (eight) hours as needed for nausea or vomiting. 03/16/20   Amin, Ankit  Chirag, MD  PARAGARD INTRAUTERINE COPPER IU 1 Device by Intrauterine route once.     [provider]  polyethylene glycol (MIRALAX / GLYCOLAX) 17 g packet Take 17 g by mouth daily. 03/17/20   Amin, Ankit Chirag, MD  senna-docusate (SENOKOT-S) 8.6-50 MG tablet Take 2 tablets by mouth 2 (two) times daily. 03/16/20   Damita Lack, MD    Allergies    Erythromycin  Review of Systems   Review of Systems  Gastrointestinal: Positive for abdominal pain.  All other systems reviewed and are negative.   Physical Exam Updated Vital Signs BP 105/68 (BP Location: Right Arm)   Pulse 100   Temp (!) 97.4 F (36.3 C) (Oral)   Resp 20   Ht 5\' 9"  (1.753 m)   Wt 77.1 kg   SpO2 100%   BMI 25.10 kg/m   Physical Exam Vitals and nursing note reviewed.  Constitutional:      Appearance: She is well-developed.     Comments: Patient laying in bed, moaning  and screaming appears to be in moderate discomfort.  HENT:     Head: Atraumatic.  Eyes:     Conjunctiva/sclera: Conjunctivae normal.  Cardiovascular:     Rate and Rhythm: Tachycardia present.     Heart sounds: Normal heart sounds.  Pulmonary:     Effort: Pulmonary effort is normal.     Breath sounds: Normal breath sounds.  Abdominal:     General: Abdomen is flat. Bowel sounds are normal.     Palpations: Abdomen is soft.     Tenderness: There is abdominal tenderness in the suprapubic area and left lower quadrant. There is guarding. There is no rebound.     Hernia: No hernia is present.  Genitourinary:    Comments: Chaperone present during exam.  Normal rectal tone, no obvious mass, black color stool on glove, no frank bleeding noted.  Hemoccult positive. Musculoskeletal:     Cervical back: Neck supple.  Skin:    Findings: No rash.  Neurological:     Mental Status: She is alert.     ED Results / Procedures / Treatments   Labs (all labs ordered are listed, but only abnormal results are displayed) Labs Reviewed  COMPREHENSIVE METABOLIC PANEL - Abnormal; Notable for the following components:      Result Value   Sodium 134 (*)    CO2 20 (*)    Glucose, Bld 114 (*)    Calcium 8.8 (*)    All other components within normal limits  CBC - Abnormal; Notable for the following components:   WBC 23.2 (*)    MCH 25.2 (*)    RDW 22.0 (*)    All other components within normal limits  I-STAT BETA HCG BLOOD, ED ([facility], [facility], [facility] ONLY) - Abnormal; Notable for the following components:   I-stat hCG, quantitative 11.3 (*)    All other components within normal limits  CULTURE, BLOOD (ROUTINE X 2)  CULTURE, BLOOD (ROUTINE X 2)  SARS CORONAVIRUS 2 BY RT PCR (HOSPITAL ORDER, [place] [facility])  LIPASE, BLOOD  URINALYSIS, ROUTINE W REFLEX MICROSCOPIC  LACTIC ACID, PLASMA  LACTIC ACID, PLASMA  POC OCCULT BLOOD, ED    EKG None  Radiology CT ABDOMEN PELVIS W  CONTRAST  Result Date: 03/31/2020 CLINICAL DATA:  38 year old female with history of left lower quadrant abdominal pain. EXAM: CT ABDOMEN AND PELVIS WITH CONTRAST TECHNIQUE: Multidetector CT imaging of the abdomen and pelvis was performed using the standard protocol  following bolus administration of intravenous contrast. CONTRAST:  149mL OMNIPAQUE IOHEXOL 300 MG/ML  SOLN COMPARISON:  CT the abdomen and pelvis 03/12/2020. FINDINGS: Lower chest: Unremarkable. 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 No other new hepatic lesions. No intra or extrahepatic biliary ductal dilatation. Gallbladder is normal in appearance. Pancreas: No pancreatic mass. No pancreatic ductal dilatation. No peripancreatic fluid collections or inflammatory changes. Spleen: Unremarkable. Adrenals/Urinary Tract: Bilateral kidneys and bilateral adrenal glands are unremarkable in appearance. No hydroureteronephrosis. Urinary bladder is normal in appearance. Stomach/Bowel: Normal appearance of the stomach. No pathologic dilatation of small bowel or colon. Previous the noted lesion in the mid sigmoid colon appears larger than the prior examination estimated to measure approximately 6.7 x 3.2 x 4.4 cm (coronal image 48 of series 4 and sagittal image 78 of series 5). Adjacent to this there is a very complex collection of gas and fluid which is now intimately associated with the left adnexa measuring approximately 6.5 x 6.2 x 6.6 cm (axial image 75 of series 2 and coronal image 52 of series 4). This collection has fluid of heterogeneous attenuation and multiple internal locules of gas, as well as multiple areas of internal enhancement. This is associated with a serpiginous structure with peripheral enhancement in the left adnexal region, likely to represent dilated fallopian  tube which also contains gas and fluid. Notably, this collection appears to communicate with the previously described colonic mass across the midline best appreciated on axial image 70 of series 2. Normal appendix. Vascular/Lymphatic: No significant atherosclerotic disease, aneurysm or dissection noted in the abdominal or pelvic vasculature. Multiple enlarged lymph nodes in the pelvis measuring up to 1.2 cm in short axis adjacent to the inferior mesenteric artery (axial image 61 of series 2). Borderline enlarged retroperitoneal lymph nodes are also noted measuring up to 8 mm in short axis. Reproductive: IUD present in the uterus. Uterus and right ovary are otherwise unremarkable in appearance. Large complex gas and fluid collection intimately associated with the left adnexa (discussed above). Tubular serpiginous area with rim enhancement also noted in the left adnexal region. Other: Small volume of ascites.  No pneumoperitoneum. Musculoskeletal: There are no aggressive appearing lytic or blastic lesions noted in the visualized portions of the skeleton. IMPRESSION: 1. Previously noted sigmoid colon mass appears increased in size, and again appears to be associated with a focal contained perforation which crosses the midline and has fistulized into the left adnexal region where there is now what appears to be a large left tubo-ovarian abscess, as detailed above. This is also associated with multiple enlarged lymph nodes in the pelvis measuring up to 1.2 cm in short axis and borderline enlarged retroperitoneal lymph nodes, concerning for metastatic disease. In addition, previously suspected metastatic lesion in segment 7 of the liver has enlarged. 2. Small volume of ascites. 3. Additional incidental findings, as above. These results were called by telephone at the time of interpretation on 03/31/2020 at 11:58 am to provider [facility], who verbally acknowledged these results. Electronically Signed   By: Vinnie Langton  M.D.   On: 03/31/2020 12:00    Procedures Procedures (including critical care time)  Medications Ordered in ED Medications  sodium chloride flush (NS) 0.9 % injection 3 mL (has no administration in time range)  sodium chloride (PF) 0.9 % injection (has no administration  in time range)  piperacillin-tazobactam (ZOSYN) IVPB 3.375 g (has no administration in time range)  HYDROmorphone (DILAUDID) injection 1 mg (1 mg Intravenous Given 03/31/20 0924)  ondansetron (ZOFRAN) injection 4 mg (4 mg Intravenous Given 03/31/20 0924)  sodium chloride 0.9 % bolus 1,000 mL (0 mLs Intravenous Stopped 03/31/20 1125)  HYDROmorphone (DILAUDID) injection 1 mg (1 mg Intravenous Given 03/31/20 0951)  iohexol (OMNIPAQUE) 300 MG/ML solution 100 mL (100 mLs Intravenous Contrast Given 03/31/20 1112)  HYDROmorphone (DILAUDID) injection 1 mg (1 mg Intravenous Given 03/31/20 1228)  ondansetron (ZOFRAN) injection 4 mg (4 mg Intravenous Given 03/31/20 1229)    ED Course  I have reviewed the triage vital signs and the nursing notes.  Pertinent labs & imaging results that were available during my care of the patient were reviewed by me and considered in my medical decision making (see chart for details).    MDM Rules/Calculators/A&P                          BP 126/79   Pulse 89   Temp (!) 97.4 F (36.3 C) (Oral)   Resp 20   Ht 5\' 9"  (1.753 m)   Wt 77.1 kg   SpO2 99%   BMI 25.10 kg/m   Final Clinical Impression(s) / ED Diagnoses Final diagnoses:  TOA (tubo-ovarian abscess)  Malignancy ([place])    Rx / DC Orders ED Discharge Orders    None     9:22 AM Patient previously diagnosed with possible rectal carcinoma and was treated for complicated diverticulitis with possible abscess in hospital approximately 3 weeks ago.  She received Zosyn in hospital, and subsequently discharged on 8 days of oral Augmentin which she has finished.  Pain has now returned and she appears to be very uncomfortable, actively screaming and  moaning.  Work-up initiated, will provide pain medication, will repeat abdominal pelvis CT scan.  10:17 AM Rectal exam positive for fecal occult blood.  Stool is dark.  Patient does take ibuprofen however I suspect her bleeding is mainly related to her diverticular infection previously documented.  Work up initiated.    12:12 PM Labs remarkable for leukocytosis with WBC 23.2, markedly elevated from prior. An abdominal pelvis CT scan performed showing a previously noted sigmoid colon mass appears to increase in size, and again appears to be associate with a focal contained perforation with cross midline and has fistulized into the left adnexal regions and wet and now appears to be large left tubo-ovarian abscess.  There are also multiple enlarged lymph nodes in the pelvis concerning for metastatic disease.  Small volume ascites.  This finding was discussed with the on-call general surgeon, Dr. Marcello Moores, who agrees to see the patient but request medicine for admission for further work-up.  Patient initially was in a lot of discomfort but after receiving several dose of pain medication she appears to be more comfortable.  Vital signs stable.  Zosyn initiated.  IV fluid given.  Patient made aware of findings.  12:19 PM Appreciate consultation from [facility] who agrees to see and admit pt for further work up.  COVID-19 screening test ordered.  Stephanie [name] was evaluated in Emergency Department on 03/31/2020 for the symptoms described in the history of present illness. She was evaluated in the context of the global COVID-19 pandemic, which necessitated consideration that the patient might be at risk for infection with the SARS-CoV-2 virus that causes COVID-19. Institutional protocols and algorithms that pertain to  the evaluation of patients at risk for COVID-19 are in a state of rapid change based on information released by regulatory bodies including the CDC and federal and state organizations.  These policies and algorithms were followed during the patient's care in the ED.    Domenic Moras, PA-C 03/31/20 1230    Lacretia Leigh, MD 04/01/20 1534

## 2020-03-31 NOTE — Progress Notes (Signed)
Attempted to call for report RN not available will call as soon as they become available.

## 2020-03-31 NOTE — Progress Notes (Signed)
MD Opyd was paged because patient is complaining of pain 8 in her abdomen, PRN IV Dilaudid 1 mg every 2 hours was given at 1845.

## 2020-03-31 NOTE — ED Triage Notes (Signed)
Patient here from home reporting abd pain radiating around to left flank. Reports being seen for same on 6/13 diagnosed with diverticulitis. Pain 10/10.

## 2020-03-31 NOTE — ED Notes (Signed)
Pt is aware that urine sample is needed but is unable to provide one that this time.

## 2020-03-31 NOTE — Progress Notes (Signed)
PHARMACY NOTE -  Cefepime  Pharmacy has been assisting with dosing of cefepime for IAI.  Dosage remains stable at 2g IV q8 hr and need for further dosage adjustment appears unlikely at present given renal function at baseline  Pharmacy will sign off, following peripherally for culture results or dose adjustments. Please reconsult if a change in clinical status warrants re-evaluation of dosage.  Reuel Boom, PharmD, BCPS [phone] 03/31/2020, 2:08 PM

## 2020-03-31 NOTE — Consult Note (Signed)
CC: abd pain  Requesting provider: Dr Madison Hickman  HPI: Stephanie [name] is an 39 y.o. female who is here for increased abd pain.  Pt was admitted for diverticulitis with possible colon mass seen on CT.  Symptoms improved on abx and was discharged to home with plans for colonoscopy in 6 wks.  Unfortunately she developed worsening abd pain and presented to the ED on 7/3.  Past Medical History:  Diagnosis Date   BV (bacterial vaginosis)    Lactose intolerance 03/12/2020   UTI (lower urinary tract infection)     Past Surgical History:  Procedure Laterality Date   CESAREAN SECTION      Family History  Problem Relation Age of Onset   Heart disease Father    Colon cancer Neg Hx    Esophageal cancer Neg Hx     Social:  reports that she has quit smoking. Her smoking use included cigarettes. She has a 5.00 pack-year smoking history. She has never used smokeless tobacco. She reports current alcohol use. She reports that she does not use drugs.  Allergies: No Known Allergies  Medications: I have reviewed the patient's current medications.  Results for orders placed or performed during the hospital encounter of 03/31/20 (from the past 48 hour(s))  Lipase, blood     Status: None   Collection Time: 03/31/20  9:10 AM  Result Value Ref Range   Lipase 18 11 - 51 U/L    Comment: Performed at [facility], [place] [address], [place], [place] [address]  Comprehensive metabolic panel     Status: Abnormal   Collection Time: 03/31/20  9:10 AM  Result Value Ref Range   Sodium 134 (L) 135 - 145 mmol/L   Potassium 3.7 3.5 - 5.1 mmol/L   Chloride 101 98 - 111 mmol/L   CO2 20 (L) 22 - 32 mmol/L   Glucose, Bld 114 (H) 70 - 99 mg/dL    Comment: Glucose reference range applies only to samples taken after fasting for at least 8 hours.   BUN 9 6 - 20 mg/dL   Creatinine, Ser 0.90 0.44 - 1.00 mg/dL   Calcium 8.8 (L) 8.9 - 10.3 mg/dL   Total Protein 7.9 6.5 - 8.1 g/dL   Albumin 3.9  3.5 - 5.0 g/dL   AST 21 15 - 41 U/L   ALT 17 0 - 44 U/L   Alkaline Phosphatase 68 38 - 126 U/L   Total Bilirubin 1.2 0.3 - 1.2 mg/dL   GFR calc non Af Amer >60 >60 mL/min   GFR calc Af Amer >60 >60 mL/min   Anion gap 13 5 - 15    Comment: Performed at [facility], [place] [address] [place], [place] [address]  CBC     Status: Abnormal   Collection Time: 03/31/20  9:10 AM  Result Value Ref Range   WBC 23.2 (H) 4.0 - 10.5 K/uL   RBC 5.00 3.87 - 5.11 MIL/uL   Hemoglobin 12.6 12.0 - 15.0 g/dL   HCT 41.0 36 - 46 %   MCV 82.0 80.0 - 100.0 fL   MCH 25.2 (L) 26.0 - 34.0 pg   MCHC 30.7 30.0 - 36.0 g/dL   RDW 22.0 (H) 11.5 - 15.5 %   Platelets 225 150 - 400 K/uL   nRBC 0.0 0.0 - 0.2 %    Comment: Performed at [facility], [place] [address] [place], [place] [address]  I-Stat beta hCG blood, ED  Status: Abnormal   Collection Time: 03/31/20  9:18 AM  Result Value Ref Range   I-stat hCG, quantitative 11.3 (H) <5 mIU/mL   Comment 3            Comment:   GEST. AGE      CONC.  (mIU/mL)   <=1 WEEK        5 - 50     2 WEEKS       50 - 500     3 WEEKS       100 - 10,000     4 WEEKS     1,000 - 30,000        FEMALE AND NON-PREGNANT FEMALE:     LESS THAN 5 mIU/mL     CT ABDOMEN PELVIS W CONTRAST  Result Date: 03/31/2020 CLINICAL DATA:  39 year old female with history of left lower quadrant abdominal pain. EXAM: CT ABDOMEN AND PELVIS WITH CONTRAST TECHNIQUE: Multidetector CT imaging of the abdomen and pelvis was performed using the standard protocol following bolus administration of intravenous contrast. CONTRAST:  158mL OMNIPAQUE IOHEXOL 300 MG/ML  SOLN COMPARISON:  CT the abdomen and pelvis 03/12/2020. FINDINGS: Lower chest: Unremarkable. 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 No  other new hepatic lesions. No intra or extrahepatic biliary ductal dilatation. Gallbladder is normal in appearance. Pancreas: No pancreatic mass. No pancreatic ductal dilatation. No peripancreatic fluid collections or inflammatory changes. Spleen: Unremarkable. Adrenals/Urinary Tract: Bilateral kidneys and bilateral adrenal glands are unremarkable in appearance. No hydroureteronephrosis. Urinary bladder is normal in appearance. Stomach/Bowel: Normal appearance of the stomach. No pathologic dilatation of small bowel or colon. Previous the noted lesion in the mid sigmoid colon appears larger than the prior examination estimated to measure approximately 6.7 x 3.2 x 4.4 cm (coronal image 48 of series 4 and sagittal image 78 of series 5). Adjacent to this there is a very complex collection of gas and fluid which is now intimately associated with the left adnexa measuring approximately 6.5 x 6.2 x 6.6 cm (axial image 75 of series 2 and coronal image 52 of series 4). This collection has fluid of heterogeneous attenuation and multiple internal locules of gas, as well as multiple areas of internal enhancement. This is associated with a serpiginous structure with peripheral enhancement in the left adnexal region, likely to represent dilated fallopian tube which also contains gas and fluid. Notably, this collection appears to communicate with the previously described colonic mass across the midline best appreciated on axial image 70 of series 2. Normal appendix. Vascular/Lymphatic: No significant atherosclerotic disease, aneurysm or dissection noted in the abdominal or pelvic vasculature. Multiple enlarged lymph nodes in the pelvis measuring up to 1.2 cm in short axis adjacent to the inferior mesenteric artery (axial image 61 of series 2). Borderline enlarged retroperitoneal lymph nodes are also noted measuring up to 8 mm in short axis. Reproductive: IUD present in the uterus. Uterus and right ovary are otherwise unremarkable  in appearance. Large complex gas and fluid collection intimately associated with the left adnexa (discussed above). Tubular serpiginous area with rim enhancement also noted in the left adnexal region. Other: Small volume of ascites.  No pneumoperitoneum. Musculoskeletal: There are no aggressive appearing lytic or blastic lesions noted in the visualized portions of the skeleton. IMPRESSION: 1. Previously noted sigmoid colon mass appears increased  in size, and again appears to be associated with a focal contained perforation which crosses the midline and has fistulized into the left adnexal region where there is now what appears to be a large left tubo-ovarian abscess, as detailed above. This is also associated with multiple enlarged lymph nodes in the pelvis measuring up to 1.2 cm in short axis and borderline enlarged retroperitoneal lymph nodes, concerning for metastatic disease. In addition, previously suspected metastatic lesion in segment 7 of the liver has enlarged. 2. Small volume of ascites. 3. Additional incidental findings, as above. These results were called by telephone at the time of interpretation on 03/31/2020 at 11:58 am to provider [facility], who verbally acknowledged these results. Electronically Signed   By: Vinnie Langton M.D.   On: 03/31/2020 12:00    ROS - all of the below systems have been reviewed with the patient and positives are indicated with bold text General: chills, fever or night sweats Eyes: blurry vision or double vision ENT: epistaxis or sore throat Allergy/Immunology: itchy/watery eyes or nasal congestion Hematologic/Lymphatic: bleeding problems, blood clots or swollen lymph nodes Endocrine: temperature intolerance or unexpected weight changes Breast: new or changing breast lumps or nipple discharge Resp: cough, shortness of breath, or wheezing CV: chest pain or dyspnea on exertion GI: as per HPI GU: dysuria, trouble voiding, or hematuria MSK: joint pain or joint  stiffness Neuro: TIA or stroke symptoms Derm: pruritus and skin lesion changes Psych: anxiety and depression  PE Blood pressure 126/79, pulse 89, temperature (!) 97.4 F (36.3 C), temperature source Oral, resp. rate 20, height 5\' 9"  (1.753 m), weight 77.1 kg, SpO2 99 %. Constitutional: NAD; conversant; no deformities Eyes: Moist conjunctiva; no lid lag; anicteric; PERRL Neck: Trachea midline; no thyromegaly Lungs: Normal respiratory effort; no tactile fremitus CV: RRR; no palpable thrills; no pitting edema GI: Abd TTP lower abd; no palpable hepatosplenomegaly MSK: Normal range of motion of extremities; no clubbing/cyanosis Psychiatric: Appropriate affect; alert and oriented x3 Lymphatic: No palpable cervical or axillary lymphadenopathy  Results for orders placed or performed during the hospital encounter of 03/31/20 (from the past 48 hour(s))  Lipase, blood     Status: None   Collection Time: 03/31/20  9:10 AM  Result Value Ref Range   Lipase 18 11 - 51 U/L    Comment: Performed at [facility], [place] [address] [place], [place] [address]  Comprehensive metabolic panel     Status: Abnormal   Collection Time: 03/31/20  9:10 AM  Result Value Ref Range   Sodium 134 (L) 135 - 145 mmol/L   Potassium 3.7 3.5 - 5.1 mmol/L   Chloride 101 98 - 111 mmol/L   CO2 20 (L) 22 - 32 mmol/L   Glucose, Bld 114 (H) 70 - 99 mg/dL    Comment: Glucose reference range applies only to samples taken after fasting for at least 8 hours.   BUN 9 6 - 20 mg/dL   Creatinine, Ser 0.90 0.44 - 1.00 mg/dL   Calcium 8.8 (L) 8.9 - 10.3 mg/dL   Total Protein 7.9 6.5 - 8.1 g/dL   Albumin 3.9 3.5 - 5.0 g/dL   AST 21 15 - 41 U/L   ALT 17 0 - 44 U/L   Alkaline Phosphatase 68 38 - 126 U/L   Total Bilirubin 1.2 0.3 - 1.2 mg/dL   GFR calc non Af Amer >60 >60 mL/min   GFR calc Af Amer >60 >60 mL/min   Anion gap 13 5 -  15    Comment: Performed at [facility], [place] [address],  [place], [place] [address]  CBC     Status: Abnormal   Collection Time: 03/31/20  9:10 AM  Result Value Ref Range   WBC 23.2 (H) 4.0 - 10.5 K/uL   RBC 5.00 3.87 - 5.11 MIL/uL   Hemoglobin 12.6 12.0 - 15.0 g/dL   HCT 41.0 36 - 46 %   MCV 82.0 80.0 - 100.0 fL   MCH 25.2 (L) 26.0 - 34.0 pg   MCHC 30.7 30.0 - 36.0 g/dL   RDW 22.0 (H) 11.5 - 15.5 %   Platelets 225 150 - 400 K/uL   nRBC 0.0 0.0 - 0.2 %    Comment: Performed at [facility], [place] [address], [place], [place] [address]  I-Stat beta hCG blood, ED     Status: Abnormal   Collection Time: 03/31/20  9:18 AM  Result Value Ref Range   I-stat hCG, quantitative 11.3 (H) <5 mIU/mL   Comment 3            Comment:   GEST. AGE      CONC.  (mIU/mL)   <=1 WEEK        5 - 50     2 WEEKS       50 - 500     3 WEEKS       100 - 10,000     4 WEEKS     1,000 - 30,000        FEMALE AND NON-PREGNANT FEMALE:     LESS THAN 5 mIU/mL     CT ABDOMEN PELVIS W CONTRAST  Result Date: 03/31/2020 CLINICAL DATA:  39 year old female with history of left lower quadrant abdominal pain. EXAM: CT ABDOMEN AND PELVIS WITH CONTRAST TECHNIQUE: Multidetector CT imaging of the abdomen and pelvis was performed using the standard protocol following bolus administration of intravenous contrast. CONTRAST:  115mL OMNIPAQUE IOHEXOL 300 MG/ML  SOLN COMPARISON:  CT the abdomen and pelvis 03/12/2020. FINDINGS: Lower chest: Unremarkable. 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 No other new hepatic lesions. No intra or extrahepatic biliary ductal dilatation. Gallbladder is normal in appearance. Pancreas: No pancreatic mass. No pancreatic ductal dilatation. No peripancreatic fluid collections or inflammatory changes. Spleen: Unremarkable. Adrenals/Urinary Tract: Bilateral kidneys and bilateral  adrenal glands are unremarkable in appearance. No hydroureteronephrosis. Urinary bladder is normal in appearance. Stomach/Bowel: Normal appearance of the stomach. No pathologic dilatation of small bowel or colon. Previous the noted lesion in the mid sigmoid colon appears larger than the prior examination estimated to measure approximately 6.7 x 3.2 x 4.4 cm (coronal image 48 of series 4 and sagittal image 78 of series 5). Adjacent to this there is a very complex collection of gas and fluid which is now intimately associated with the left adnexa measuring approximately 6.5 x 6.2 x 6.6 cm (axial image 75 of series 2 and coronal image 52 of series 4). This collection has fluid of heterogeneous attenuation and multiple internal locules of gas, as well as multiple areas of internal enhancement. This is associated with a serpiginous structure with peripheral enhancement in the left adnexal region, likely to represent dilated fallopian tube which also contains gas and fluid. Notably, this collection appears to communicate with the previously described colonic mass  across the midline best appreciated on axial image 70 of series 2. Normal appendix. Vascular/Lymphatic: No significant atherosclerotic disease, aneurysm or dissection noted in the abdominal or pelvic vasculature. Multiple enlarged lymph nodes in the pelvis measuring up to 1.2 cm in short axis adjacent to the inferior mesenteric artery (axial image 61 of series 2). Borderline enlarged retroperitoneal lymph nodes are also noted measuring up to 8 mm in short axis. Reproductive: IUD present in the uterus. Uterus and right ovary are otherwise unremarkable in appearance. Large complex gas and fluid collection intimately associated with the left adnexa (discussed above). Tubular serpiginous area with rim enhancement also noted in the left adnexal region. Other: Small volume of ascites.  No pneumoperitoneum. Musculoskeletal: There are no aggressive appearing lytic or  blastic lesions noted in the visualized portions of the skeleton. IMPRESSION: 1. Previously noted sigmoid colon mass appears increased in size, and again appears to be associated with a focal contained perforation which crosses the midline and has fistulized into the left adnexal region where there is now what appears to be a large left tubo-ovarian abscess, as detailed above. This is also associated with multiple enlarged lymph nodes in the pelvis measuring up to 1.2 cm in short axis and borderline enlarged retroperitoneal lymph nodes, concerning for metastatic disease. In addition, previously suspected metastatic lesion in segment 7 of the liver has enlarged. 2. Small volume of ascites. 3. Additional incidental findings, as above. These results were called by telephone at the time of interpretation on 03/31/2020 at 11:58 am to provider [facility], who verbally acknowledged these results. Electronically Signed   By: Vinnie Langton M.D.   On: 03/31/2020 12:00     A/P: Stephanie [name] is an 39 y.o. female with increased wbc, abd pain and enlarging mass and pelvic abscess on CT.  Rec consult to IR for eval for drainage of pelvic abscess.  Rec IV abx.  Ok for clears right now.  Will follow.    Rosario Adie, MD  [facility]

## 2020-03-31 NOTE — ED Notes (Signed)
I have just phoned report to the [place]. Will transport.

## 2020-04-01 LAB — COMPREHENSIVE METABOLIC PANEL
ALT: 16 U/L (ref 0–44)
AST: 16 U/L (ref 15–41)
Albumin: 2.6 g/dL — ABNORMAL LOW (ref 3.5–5.0)
Alkaline Phosphatase: 43 U/L (ref 38–126)
Anion gap: 9 (ref 5–15)
BUN: 13 mg/dL (ref 6–20)
CO2: 23 mmol/L (ref 22–32)
Calcium: 8 mg/dL — ABNORMAL LOW (ref 8.9–10.3)
Chloride: 105 mmol/L (ref 98–111)
Creatinine, Ser: 0.68 mg/dL (ref 0.44–1.00)
GFR calc Af Amer: >60 mL/min (ref >60)
GFR calc non Af Amer: >60 mL/min (ref >60)
Glucose, Bld: 95 mg/dL (ref 70–99)
Potassium: 4 mmol/L (ref 3.5–5.1)
Sodium: 137 mmol/L (ref 135–145)
Total Bilirubin: 0.9 mg/dL (ref 0.3–1.2)
Total Protein: 5.7 g/dL — ABNORMAL LOW (ref 6.5–8.1)

## 2020-04-01 LAB — CBC
HCT: 36.8 % (ref 36.0–46.0)
Hemoglobin: 11.6 g/dL — ABNORMAL LOW (ref 12.0–15.0)
MCH: 25.6 pg — ABNORMAL LOW (ref 26.0–34.0)
MCHC: 31.5 g/dL (ref 30.0–36.0)
MCV: 81.1 fL (ref 80.0–100.0)
Platelets: 162 10*3/uL (ref 150–400)
RBC: 4.54 MIL/uL (ref 3.87–5.11)
RDW: 21.5 % — ABNORMAL HIGH (ref 11.5–15.5)
WBC: 7.4 10*3/uL (ref 4.0–10.5)
nRBC: 0 % (ref 0.0–0.2)

## 2020-04-01 NOTE — Progress Notes (Signed)
Subjective/Chief Complaint: Complains of abd pain although no fever and wbc normal   Objective: Vital signs in last 24 hours: Temp:  [97.4 F (36.3 C)-98.8 F (37.1 C)] 98.7 F (37.1 C) (07/04 0625) Pulse Rate:  [79-109] 95 (07/04 0625) Resp:  [18-20] 18 (07/04 0625) BP: (102-146)/(58-83) 103/62 (07/04 0625) SpO2:  [95 %-100 %] 97 % (07/04 0625) Weight:  [77.1 kg] 77.1 kg (07/03 0850) Last BM Date: 03/31/20  Intake/Output from previous day: 07/03 0701 - 07/04 0700 In: 2916.4 [P.O.:120; I.V.:1387.5; IV Piggyback:1408.9] Out: 300 [Urine:300] Intake/Output this shift: No intake/output data recorded.  General appearance: alert and cooperative Resp: clear to auscultation bilaterally Cardio: regular rate and rhythm GI: diffusely tender  Lab Results:  Recent Labs    03/31/20 0910 04/01/20 0514  WBC 23.2* 7.4  HGB 12.6 11.6*  HCT 41.0 36.8  PLT 225 162   BMET Recent Labs    03/31/20 0910 04/01/20 0514  NA 134* 137  K 3.7 4.0  CL 101 105  CO2 20* 23  GLUCOSE 114* 95  BUN 9 13  CREATININE 0.90 0.68  CALCIUM 8.8* 8.0*   PT/INR Recent Labs    03/31/20 1604  LABPROT 15.2  INR 1.3*   ABG No results for input(s): PHART, HCO3 in the last 72 hours.  Invalid input(s): PCO2, PO2  Studies/Results: CT ABDOMEN PELVIS W CONTRAST  Result Date: 03/31/2020 CLINICAL DATA:  39 year old female with history of left lower quadrant abdominal pain. EXAM: CT ABDOMEN AND PELVIS WITH CONTRAST TECHNIQUE: Multidetector CT imaging of the abdomen and pelvis was performed using the standard protocol following bolus administration of intravenous contrast. CONTRAST:  169mL OMNIPAQUE IOHEXOL 300 MG/ML  SOLN COMPARISON:  CT the abdomen and pelvis 03/12/2020. FINDINGS: Lower chest: Unremarkable. Hepatobiliary: Large hypovascular lesion in segment 7 of the liver measuring 3.0 x 2.4 cm, increased in size compared to the prior study (previously 2.5 x 1.8 cm when measured in a similar fashion  on prior examination). No other new hepatic lesions. No intra or extrahepatic biliary ductal dilatation. Gallbladder is normal in appearance. Pancreas: No pancreatic mass. No pancreatic ductal dilatation. No peripancreatic fluid collections or inflammatory changes. Spleen: Unremarkable. Adrenals/Urinary Tract: Bilateral kidneys and bilateral adrenal glands are unremarkable in appearance. No hydroureteronephrosis. Urinary bladder is normal in appearance. Stomach/Bowel: Normal appearance of the stomach. No pathologic dilatation of small bowel or colon. Previous the noted lesion in the mid sigmoid colon appears larger than the prior examination estimated to measure approximately 6.7 x 3.2 x 4.4 cm (coronal image 48 of series 4 and sagittal image 78 of series 5). Adjacent to this there is a very complex collection of gas and fluid which is now intimately associated with the left adnexa measuring approximately 6.5 x 6.2 x 6.6 cm (axial image 75 of series 2 and coronal image 52 of series 4). This collection has fluid of heterogeneous attenuation and multiple internal locules of gas, as well as multiple areas of internal enhancement. This is associated with a serpiginous structure with peripheral enhancement in the left adnexal region, likely to represent dilated fallopian tube which also contains gas and fluid. Notably, this collection appears to communicate with the previously described colonic mass across the midline best appreciated on axial image 70 of series 2. Normal appendix. Vascular/Lymphatic: No significant atherosclerotic disease, aneurysm or dissection noted in the abdominal or pelvic vasculature. Multiple enlarged lymph nodes in the pelvis measuring up to 1.2 cm in short axis adjacent to the inferior mesenteric artery (axial  image 61 of series 2). Borderline enlarged retroperitoneal lymph nodes are also noted measuring up to 8 mm in short axis. Reproductive: IUD present in the uterus. Uterus and right ovary  are otherwise unremarkable in appearance. Large complex gas and fluid collection intimately associated with the left adnexa (discussed above). Tubular serpiginous area with rim enhancement also noted in the left adnexal region. Other: Small volume of ascites.  No pneumoperitoneum. Musculoskeletal: There are no aggressive appearing lytic or blastic lesions noted in the visualized portions of the skeleton. IMPRESSION: 1. Previously noted sigmoid colon mass appears increased in size, and again appears to be associated with a focal contained perforation which crosses the midline and has fistulized into the left adnexal region where there is now what appears to be a large left tubo-ovarian abscess, as detailed above. This is also associated with multiple enlarged lymph nodes in the pelvis measuring up to 1.2 cm in short axis and borderline enlarged retroperitoneal lymph nodes, concerning for metastatic disease. In addition, previously suspected metastatic lesion in segment 7 of the liver has enlarged. 2. Small volume of ascites. 3. Additional incidental findings, as above. These results were called by telephone at the time of interpretation on 03/31/2020 at 11:58 am to provider [facility] who verbally acknowledged these results. Electronically Signed   By: Vinnie Langton M.D.   On: 03/31/2020 12:00    Anti-infectives: Anti-infectives (From admission, onward)   Start     Dose/Rate Route Frequency Ordered Stop   03/31/20 2000  ceFEPIme (MAXIPIME) 2 g in sodium chloride 0.9 % 100 mL IVPB     Discontinue     2 g 200 mL/hr over 30 Minutes Intravenous Every 8 hours 03/31/20 1408     03/31/20 2000  metroNIDAZOLE (FLAGYL) IVPB 500 mg     Discontinue     500 mg 100 mL/hr over 60 Minutes Intravenous Every 8 hours 03/31/20 1410     03/31/20 1600  ceFEPIme (MAXIPIME) 2 g in sodium chloride 0.9 % 100 mL IVPB  Status:  Discontinued        2 g 200 mL/hr over 30 Minutes Intravenous  Once 03/31/20 1549 03/31/20 1551    03/31/20 1200  piperacillin-tazobactam (ZOSYN) IVPB 3.375 g        3.375 g 100 mL/hr over 30 Minutes Intravenous  Once 03/31/20 1159 03/31/20 1341      Assessment/Plan: s/p * No surgery found * Continue bowel rest and abx. No fever and wbc normal CEA pending Colon mass with abscess and liver lesion Would like to get her better with abx which would allow [place] to have her scoped and get a tissue diagnosis. If she does not improve then she could require surgery sooner rather than later which would involve diversion with ostomy. Monitor closely  LOS: 1 day    Autumn Messing III 04/01/2020

## 2020-04-01 NOTE — Progress Notes (Signed)
Interventional Radiology Progress Note   VIR request to review for possible drainage of abscess.    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  The abscess is ~75% occupied by small bowel.    No safe window for anterior or posterior approach to drain at this time.   I have discussed with Dr. Avon Gully.   Call with questions/concerns.   Signed,  Dulcy Fanny. Earleen Newport, DO

## 2020-04-01 NOTE — Consult Note (Signed)
Referring Provider: Dr. Holli Humbles Primary Care Physician:  Patient, [name] Primary Gastroenterologist: Dr. 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 Tumor size as increased when compared to imaging from last month.  She is not a candidate for percutaneous drainage of the abscess.  - 3 cm right hepatic lobe mass worrisome for possible metastatic disease. Increased in size from 6/21. MRI indeterminate last month.  - CEA 4.9 - Microcytic anemia with  - Intermittent BRBPR x the last few years, FOBT+ stools on evaluation in ED 2019  Findings worrisome for colon cancer. Colonoscopy is high risk in the setting of adnexal abscess involving the small bowel. Could consider colonoscopy in 6-8 weeks for tissue diagnosis if she does not require surgery prior to that time. Time of colonoscopy briefly reviewed with Dr. Marlou Starks.   PLAN: - IV antibiotics - Surgery is following - Anticipate possible colonoscopy in 6-8 weeks - will coordinate timing with surgery  The inpatient [facility] consult service will move to stand-by. However, we are available if needed during this hospitalization. Please call the on-call [place] gastroenterologist with any questions or concerns.   HPI: Stephanie [name] is a 39 y.o. female seen in consultation at the request of Dr. Avon Gully for further evaluation of sigmoid mass. The history is obtained through the patient and review of her electronic health record. She has a history of anxiety and ADHD.   Seen as an outpatient at [facility] in 2018 for abdominal discomfort, diarrhea, and intermittent rectal bleeding. She attributed her symptoms to a parasite and noted symptom improvement with two courses of albendazole. A CT scan showed moderate stool and mild anorectal thickening. CBC was normal. Stool was positive for  occult blood. She did not proceed with colonoscopy as recommended at the time because her symptoms improved.    She has continued to have intermittent bright red blood per rectum over the last few years. She has had stool positive for occult blood during two ER visits in 2019. CT scans at each of those visits showed no abdominal findings but suggested possible pelvic congestion syndrome.   She was hospitalized 6/14-6/18/21 for LLQ abdominal pain and bright red blood per rectum. She was found to have microcytic anemia with a hemoglobin of 8.7, MCV 78.1, RDW 16.5. CT showed sigmoid colon malignancy versus diverticulitis and a liver mass. CEA 4.9. Treated with IV antibiotics and discharged on Augment. Attempt at liver biopsy suggested it was a hemangioma. MRI was equivocal.   She represented with recurrent abdominal pain 03/31/20. Repeat CT shows suspected sigmoid colon carcinoma, with perforation and fistulization to the left adnexa, resulting in abscess of left adnexa. There are multiple enlarged lymph nodes. The liver lesion has also enlarged.  The abscess is ~75% occupied by small bowel and IR could not find a safe window for percutaneous drain placement.   No known family history of colon cancer or polyps. No family history of uterine/endometrial cancer, pancreatic cancer or gastric/stomach cancer. Mother and brother have IBS.    Past Medical History:  Diagnosis Date  . BV (bacterial vaginosis)   . Lactose intolerance 03/12/2020  . UTI (lower urinary tract infection)     Past Surgical History:  Procedure Laterality Date  . CESAREAN SECTION      Current Facility-Administered Medications  Medication Dose Route Frequency Provider Last Rate Last Admin  .  ceFEPIme (MAXIPIME) 2 g in sodium chloride 0.9 % 100 mL IVPB  2 g Intravenous Q8H Wofford, Drew A, RPH 200 mL/hr at 04/01/20 1309 2 g at 04/01/20 1309  . famotidine (PEPCID) IVPB 20 mg premix  20 mg Intravenous Q12H Opyd, Ilene Qua, MD 100 mL/hr  at 04/01/20 0903 20 mg at 04/01/20 0903  . HYDROmorphone (DILAUDID) injection 0.5-1 mg  0.5-1 mg Intravenous Q2H PRN Donnamae Jude, MD   1 mg at 04/01/20 1307  . lactated ringers infusion   Intravenous Continuous Donnamae Jude, MD 125 mL/hr at 04/01/20 1048 New Bag at 04/01/20 1048  . metroNIDAZOLE (FLAGYL) IVPB 500 mg  500 mg Intravenous Q8H Wofford, Drew A, RPH 100 mL/hr at 04/01/20 1310 500 mg at 04/01/20 1310  . ondansetron (ZOFRAN) tablet 4 mg  4 mg Oral Q6H PRN Donnamae Jude, MD       Or  . ondansetron [facility]) injection 4 mg  4 mg Intravenous Q6H PRN Donnamae Jude, MD        Allergies as of 03/31/2020  . (No Known Allergies)    Family History  Problem Relation Age of Onset  . Heart disease Father   . Colon cancer Neg Hx   . Esophageal cancer Neg Hx     Social History   Socioeconomic History  . Marital status: Single    Spouse name: Not on file  . Number of children: Not on file  . Years of education: Not on file  . Highest education level: Not on file  Occupational History  . Not on file  Tobacco Use  . Smoking status: Former Smoker    Packs/day: 0.50    Years: 10.00    Pack years: 5.00    Types: Cigarettes  . Smokeless tobacco: Never Used  Vaping Use  . Vaping Use: Never used  Substance and Sexual Activity  . Alcohol use: Yes    Comment: seldom  . Drug use: No  . Sexual activity: Yes    Birth control/protection: None, Other-see comments    Comment: nuva ring  Other Topics Concern  . Not on file  Social History Narrative  . Not on file   Social Determinants of Health   Financial Resource Strain:   . Difficulty of Paying Living Expenses:   Food Insecurity:   . Worried About Charity fundraiser in the Last Year:   . Arboriculturist in the Last Year:   Transportation Needs:   . Film/video editor (Medical):   [name] Lack of Transportation (Non-Medical):   Physical Activity:   . Days of Exercise per Week:   . Minutes of Exercise per Session:     Stress:   . Feeling of Stress :   Social Connections:   . Frequency of Communication with Friends and Family:   . Frequency of Social Gatherings with Friends and Family:   . Attends Religious Services:   . Active Member of Clubs or Organizations:   . Attends Archivist Meetings:   [name] Marital Status:   Intimate Partner Violence:   . Fear of Current or Ex-Partner:   . Emotionally Abused:   [name] Physically Abused:   . Sexually Abused:     Review of Systems: 12 system ROS is negative except as noted above.   Physical Exam: General:   Alert,  well-nourished, pleasant and cooperative in NAD Head:  Normocephalic and atraumatic. Eyes:  Sclera clear, no icterus.   Conjunctiva pink.  Ears:  Normal auditory acuity. Nose:  No deformity, discharge,  or lesions. Mouth:  No deformity or lesions.   Neck:  Supple; no masses or thyromegaly. Lungs:  Clear throughout to auscultation.   No wheezes. Heart:  Regular rate and rhythm; no murmurs. Abdomen:  Soft,nontender, nondistended, normal bowel sounds, no rebound or guarding. No hepatosplenomegaly.   Rectal:  Deferred  Msk:  Symmetrical. No boney deformities LAD: No inguinal or umbilical LAD Extremities:  No clubbing or edema. Neurologic:  Alert and  oriented x4;  grossly nonfocal Skin:  Intact without significant lesions or rashes. Psych:  Alert and cooperative. Normal mood and affect.   Lab Results: Recent Labs    03/31/20 0910 04/01/20 0514  WBC 23.2* 7.4  HGB 12.6 11.6*  HCT 41.0 36.8  PLT 225 162   BMET Recent Labs    03/31/20 0910 04/01/20 0514  NA 134* 137  K 3.7 4.0  CL 101 105  CO2 20* 23  GLUCOSE 114* 95  BUN 9 13  CREATININE 0.90 0.68  CALCIUM 8.8* 8.0*   LFT Recent Labs    04/01/20 0514  PROT 5.7*  ALBUMIN 2.6*  AST 16  ALT 16  ALKPHOS 43  BILITOT 0.9   PT/INR Recent Labs    03/31/20 1604  LABPROT 15.2  INR 1.3*     Studies/Results: CT ABDOMEN PELVIS W CONTRAST  Result Date:  03/31/2020 CLINICAL DATA:  39 year old female with history of left lower quadrant abdominal pain. EXAM: CT ABDOMEN AND PELVIS WITH CONTRAST TECHNIQUE: Multidetector CT imaging of the abdomen and pelvis was performed using the standard protocol following bolus administration of intravenous contrast. CONTRAST:  137mL OMNIPAQUE IOHEXOL 300 MG/ML  SOLN COMPARISON:  CT the abdomen and pelvis 03/12/2020. FINDINGS: Lower chest: Unremarkable. 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 No other new hepatic lesions. No intra or extrahepatic biliary ductal dilatation. Gallbladder is normal in appearance. Pancreas: No pancreatic mass. No pancreatic ductal dilatation. No peripancreatic fluid collections or inflammatory changes. Spleen: Unremarkable. Adrenals/Urinary Tract: Bilateral kidneys and bilateral adrenal glands are unremarkable in appearance. No hydroureteronephrosis. Urinary bladder is normal in appearance. Stomach/Bowel: Normal appearance of the stomach. No pathologic dilatation of small bowel or colon. Previous the noted lesion in the mid sigmoid colon appears larger than the prior examination estimated to measure approximately 6.7 x 3.2 x 4.4 cm (coronal image 48 of series 4 and sagittal image 78 of series 5). Adjacent to this there is a very complex collection of gas and fluid which is now intimately associated with the left adnexa measuring approximately 6.5 x 6.2 x 6.6 cm (axial image 75 of series 2 and coronal image 52 of series 4). This collection has fluid of heterogeneous attenuation and multiple internal locules of gas, as well as multiple areas of internal enhancement. This is associated with a serpiginous structure with peripheral enhancement in the left adnexal region, likely to represent dilated fallopian tube which also contains  gas and fluid. Notably, this collection appears to communicate with the previously described colonic mass across the midline best appreciated on axial image 70 of series 2. Normal appendix. Vascular/Lymphatic: No significant atherosclerotic disease, aneurysm or dissection noted in the abdominal or pelvic vasculature. Multiple enlarged lymph nodes in the pelvis measuring up to 1.2 cm in short axis adjacent to the inferior mesenteric  artery (axial image 61 of series 2). Borderline enlarged retroperitoneal lymph nodes are also noted measuring up to 8 mm in short axis. Reproductive: IUD present in the uterus. Uterus and right ovary are otherwise unremarkable in appearance. Large complex gas and fluid collection intimately associated with the left adnexa (discussed above). Tubular serpiginous area with rim enhancement also noted in the left adnexal region. Other: Small volume of ascites.  No pneumoperitoneum. Musculoskeletal: There are no aggressive appearing lytic or blastic lesions noted in the visualized portions of the skeleton. IMPRESSION: 1. Previously noted sigmoid colon mass appears increased in size, and again appears to be associated with a focal contained perforation which crosses the midline and has fistulized into the left adnexal region where there is now what appears to be a large left tubo-ovarian abscess, as detailed above. This is also associated with multiple enlarged lymph nodes in the pelvis measuring up to 1.2 cm in short axis and borderline enlarged retroperitoneal lymph nodes, concerning for metastatic disease. In addition, previously suspected metastatic lesion in segment 7 of the liver has enlarged. 2. Small volume of ascites. 3. Additional incidental findings, as above. These results were called by telephone at the time of interpretation on 03/31/2020 at 11:58 am to provider [facility], who verbally acknowledged these results. Electronically Signed   By: Vinnie Langton M.D.   On: 03/31/2020  12:00      Kimmberly Wisser L. Tarri Glenn, MD, MPH 04/01/2020, 2:03 PM

## 2020-04-01 NOTE — Progress Notes (Signed)
PROGRESS NOTE    Stephanie [name]  GBT:[id number] DOB: [birth year] DOA: 03/31/2020 PCP: Patient, [name]   Brief Narrative:  Stephanie [name] is a 39 y.o. female with medical history significant of recent admission from June 14 to June 18 with abdominal pain and abscess with a question of sigmoid colon malignancy versus diverticulitis.  At that time she had an abnormal CT scan which also showed a liver mass.  She had a barely elevated CEA at 4.9.  She was placed on IV antibiotics, an attempt was made at liver biopsy however ultrasound suggested it might be a hemangioma.  An MRI was equivocal.  She was sent home on Augmentin which she completed and was doing well with until the last few days.  She has continued to have blood in her stool.  Stools are soft and not fully formed.  She is passing gas.  She has had increasing nausea with emesis over the last 12 hours.  She reports ongoing lower abdominal cramping that is worse in the left lower quadrant.  Her pain was 10 out of 10 requiring IV Dilaudid in the ED.  She has an IUD in place, no sexual partner for the last 1 year.  She denies vaginal discharge, vaginal bleeding, dysuria, hematuria, chest pain, or shortness of breath. In the ED she was sent for CT of the abdomen and pelvis which showed enlarging sigmoid colonic mass which appears to be associated with focal perforation and has fistulized into the left adnexal region involving the left TOA.  There are multiple enlarged lymph nodes which are bigger than before measuring up to 1.2 cm.  The previously suspected metastatic lesion in the liver is also enlarged, small volume ascites.  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  Cefepime plus Flagyl for intra-abdominal abscess with presumed malignancy  Repeat CEA pending: No results found for: CEA  Continue IV fluids while diet is limited -currently on clears per surgery  Questionable colorectal carcinoma originating from the sigmoid colon  Presumed due to enlarging mass on CT, enlarging lymph nodes, enlarging liver mass.   Reviewed with general surgery, with infection present would likely need a colostomy if surgery had to be done right now. Surgery recommending endoscopy as above  Intractable N/V/Pain  Improving drastically on current medication regimen  Continue treatment as outlined above  Blood loss anemia on chronic IRON def anemia  Scant bright red blood per rectum noted on previous exam as well as with recent BM  Hgb minimally downtrending with IV fluids  Moderate protein caloric malnutrition? Albumin 2.6  In the setting of above  Treat intractable nausea vomiting abdominal pain, advance diet as tolerated once cleared by consults, currently on clears will likely need to be n.p.o. at some point for endoscopy.   DVT prophylaxis: SCD/Compression stockings given questionable GI bleed as above Code Status: Full code  Family Communication: None present Status is: Inpatient  Dispo: The patient is from: Home              Anticipated d/c  is to: Home              Anticipated d/c date is: Pending clinical course, likely 72 to 96 hours              Patient currently not medically stable for discharge given ongoing need for IV fluids IV antiemetics and pain control. Requires further evaluation with surgery, interventional radiology and likely GI for ongoing imaging and procedure.  Consultants:   GI, general surgery,  interventional radiology  Procedures:   None scheduled today, pending further evaluation  Antimicrobials:  Cefepime, Flagyl initiated 03/31/2020  Subjective: No acute issues or events overnight, nausea vomiting markedly improving but not yet resolved abdominal pain ongoing but again somewhat improved compared to admission.  Otherwise denies headache, fevers, chills, chest pain, shortness of breath.  Objective: Vitals:   03/31/20 1601 03/31/20 2204 04/01/20 0148 04/01/20 0625  BP: (!) 146/70 107/63 102/65 103/62  Pulse: (!) 109 93 (!) 103 95  Resp: 20 18 18 18   Temp: 98.8 F (37.1 C) 98.8 F (37.1 C) 98.1 F (36.7 C) 98.7 F (37.1 C)  TempSrc: Oral Oral Oral Oral  SpO2: 98% 97% 98% 97%  Weight:      Height:        Intake/Output Summary (Last 24 hours) at 04/01/2020 0732 Last data filed at 04/01/2020 0601 Gross per 24 hour  Intake 2916.41 ml  Output 300 ml  Net 2616.41 ml   Filed Weights   03/31/20 0850  Weight: 77.1 kg    Examination:  General:  Pleasantly resting in bed, No acute distress. HEENT:  Normocephalic atraumatic.  Sclerae nonicteric, noninjected.  Extraocular movements intact bilaterally. Neck:  Without mass or deformity.  Trachea is midline. Lungs:  Clear to auscultate bilaterally without rhonchi, wheeze, or rales. Heart:  Regular rate and rhythm.  Without murmurs, rubs, or gallops. Abdomen:  Soft, moderately tender suprapubic and left lower quadrant.  Otherwise without guarding or rebound. Extremities: Without cyanosis, clubbing, edema, or obvious deformity. Vascular:  Dorsalis pedis and posterior tibial pulses palpable bilaterally. Skin:  Warm and dry, no erythema, no ulcerations.   Data Reviewed: I have personally reviewed following labs and imaging studies  CBC: Recent Labs  Lab 03/31/20 0910 04/01/20 0514  WBC 23.2* 7.4  HGB 12.6 11.6*  HCT 41.0 36.8  MCV 82.0 81.1  PLT 225 509   Basic Metabolic Panel: Recent Labs  Lab 03/31/20 0910  04/01/20 0514  NA 134* 137  K 3.7 4.0  CL 101 105  CO2 20* 23  GLUCOSE 114* 95  BUN 9 13  CREATININE 0.90 0.68  CALCIUM 8.8* 8.0*   GFR: Estimated Creatinine Clearance: 98.7 mL/min (by C-G formula based on SCr of 0.68 mg/dL). Liver Function Tests: Recent Labs  Lab 03/31/20 0910 04/01/20 0514  AST 21 16  ALT 17 16  ALKPHOS 68 43  BILITOT 1.2 0.9  PROT 7.9 5.7*  ALBUMIN 3.9 2.6*   Recent Labs  Lab 03/31/20 0910  LIPASE 18   No results for input(s): AMMONIA in the last 168 hours. Coagulation Profile: Recent Labs  Lab 03/31/20 1604  INR 1.3*   Cardiac Enzymes: No results for input(s): CKTOTAL, CKMB, CKMBINDEX, TROPONINI in the last 168 hours. BNP (last 3 results) No results for input(s): PROBNP in the last 8760 hours. HbA1C: No results for input(s): HGBA1C in the last 72 hours. CBG: No results for input(s): GLUCAP in the last 168 hours. Lipid Profile: No results for input(s): CHOL, HDL, LDLCALC, TRIG,  CHOLHDL, LDLDIRECT in the last 72 hours. Thyroid Function Tests: No results for input(s): TSH, T4TOTAL, FREET4, T3FREE, THYROIDAB in the last 72 hours. Anemia Panel: No results for input(s): VITAMINB12, FOLATE, FERRITIN, TIBC, IRON, RETICCTPCT in the last 72 hours. Sepsis Labs: Recent Labs  Lab 03/31/20 1253  LATICACIDVEN 1.0    Recent Results (from the past 240 hour(s))  SARS Coronavirus 2 by RT PCR (hospital order, performed in [facility] hospital lab) Nasopharyngeal Nasopharyngeal Swab     Status: None   Collection Time: 03/31/20 12:53 PM   Specimen: Nasopharyngeal Swab  Result Value Ref Range Status   SARS Coronavirus 2 NEGATIVE NEGATIVE Final    Comment: (NOTE) SARS-CoV-2 target nucleic acids are NOT DETECTED.  The SARS-CoV-2 RNA is generally detectable in upper and lower respiratory specimens during the acute phase of infection. The lowest concentration of SARS-CoV-2 viral copies this assay can detect is 250 copies / mL. A negative result does not  preclude SARS-CoV-2 infection and should not be used as the sole basis for treatment or other patient management decisions.  A negative result may occur with improper specimen collection / handling, submission of specimen other than nasopharyngeal swab, presence of viral mutation(s) within the areas targeted by this assay, and inadequate number of viral copies (<250 copies / mL). A negative result must be combined with clinical observations, patient history, and epidemiological information.  Fact Sheet for Patients:   StrictlyIdeas.no  Fact Sheet for Healthcare Providers: BankingDealers.co.za  This test is not yet approved or  cleared by the [place] FDA and has been authorized for detection and/or diagnosis of SARS-CoV-2 by FDA under an Emergency Use Authorization (EUA).  This EUA will remain in effect (meaning this test can be used) for the duration of the COVID-19 declaration under Section 564(b)(1) of the Act, 21 U.S.C. section 360bbb-3(b)(1), unless the authorization is terminated or revoked sooner.  Performed at [facility], [place] [address] [place], [place] [address]          Radiology Studies: CT ABDOMEN PELVIS W CONTRAST  Result Date: 03/31/2020 CLINICAL DATA:  39 year old female with history of left lower quadrant abdominal pain. EXAM: CT ABDOMEN AND PELVIS WITH CONTRAST TECHNIQUE: Multidetector CT imaging of the abdomen and pelvis was performed using the standard protocol following bolus administration of intravenous contrast. CONTRAST:  146mL OMNIPAQUE IOHEXOL 300 MG/ML  SOLN COMPARISON:  CT the abdomen and pelvis 03/12/2020. FINDINGS: Lower chest: Unremarkable. 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 No other new hepatic  lesions. No intra or extrahepatic biliary ductal dilatation. Gallbladder is normal in appearance. Pancreas: No pancreatic mass. No pancreatic ductal dilatation. No peripancreatic fluid collections or inflammatory changes. Spleen: Unremarkable. Adrenals/Urinary Tract: Bilateral kidneys and bilateral adrenal glands are unremarkable in appearance. No hydroureteronephrosis. Urinary bladder is normal in appearance. Stomach/Bowel: Normal appearance of the stomach. No pathologic dilatation of small bowel or colon. Previous the noted lesion in the mid sigmoid colon appears larger than the prior examination estimated to measure approximately 6.7 x 3.2 x 4.4 cm (coronal image 48 of series 4 and sagittal image 78 of series 5). Adjacent to this there is a very complex collection of gas and fluid which is now intimately associated with the left adnexa measuring approximately 6.5 x 6.2 x 6.6 cm (axial image 75 of series 2  and coronal image 52 of series 4). This collection has fluid of heterogeneous attenuation and multiple internal locules of gas, as well as multiple areas of internal enhancement. This is associated with a serpiginous structure with peripheral enhancement in the left adnexal region, likely to represent dilated fallopian tube which also contains gas and fluid. Notably, this collection appears to communicate with the previously described colonic mass across the midline best appreciated on axial image 70 of series 2. Normal appendix. Vascular/Lymphatic: No significant atherosclerotic disease, aneurysm or dissection noted in the abdominal or pelvic vasculature. Multiple enlarged lymph nodes in the pelvis measuring up to 1.2 cm in short axis adjacent to the inferior mesenteric artery (axial image 61 of series 2). Borderline enlarged retroperitoneal lymph nodes are also noted measuring up to 8 mm in short axis. Reproductive: IUD present in the uterus. Uterus and right ovary are otherwise unremarkable in appearance.  Large complex gas and fluid collection intimately associated with the left adnexa (discussed above). Tubular serpiginous area with rim enhancement also noted in the left adnexal region. Other: Small volume of ascites.  No pneumoperitoneum. Musculoskeletal: There are no aggressive appearing lytic or blastic lesions noted in the visualized portions of the skeleton. IMPRESSION: 1. Previously noted sigmoid colon mass appears increased in size, and again appears to be associated with a focal contained perforation which crosses the midline and has fistulized into the left adnexal region where there is now what appears to be a large left tubo-ovarian abscess, as detailed above. This is also associated with multiple enlarged lymph nodes in the pelvis measuring up to 1.2 cm in short axis and borderline enlarged retroperitoneal lymph nodes, concerning for metastatic disease. In addition, previously suspected metastatic lesion in segment 7 of the liver has enlarged. 2. Small volume of ascites. 3. Additional incidental findings, as above. These results were called by telephone at the time of interpretation on 03/31/2020 at 11:58 am to provider [facility], who verbally acknowledged these results. Electronically Signed   By: Vinnie Langton M.D.   On: 03/31/2020 12:00    Scheduled Meds: Continuous Infusions: . ceFEPime (MAXIPIME) IV 2 g (04/01/20 0455)  . famotidine (PEPCID) IV 20 mg (03/31/20 2142)  . lactated ringers 125 mL/hr at 04/01/20 0242  . metronidazole 500 mg (04/01/20 0601)     LOS: 1 day   Time spent: 19min  Kamdyn Colborn C Abeer Deskins, DO [facility]  If 7PM-7AM, please contact night-coverage [email or web address]  04/01/2020, 7:32 AM

## 2020-04-02 LAB — CBC
HCT: 33.6 % — ABNORMAL LOW (ref 36.0–46.0)
Hemoglobin: 10.3 g/dL — ABNORMAL LOW (ref 12.0–15.0)
MCH: 25.3 pg — ABNORMAL LOW (ref 26.0–34.0)
MCHC: 30.7 g/dL (ref 30.0–36.0)
MCV: 82.6 fL (ref 80.0–100.0)
Platelets: 162 10*3/uL (ref 150–400)
RBC: 4.07 MIL/uL (ref 3.87–5.11)
RDW: 21.2 % — ABNORMAL HIGH (ref 11.5–15.5)
WBC: 9.9 10*3/uL (ref 4.0–10.5)
nRBC: 0 % (ref 0.0–0.2)

## 2020-04-02 LAB — COMPREHENSIVE METABOLIC PANEL
ALT: 13 U/L (ref 0–44)
AST: 14 U/L — ABNORMAL LOW (ref 15–41)
Albumin: 2.4 g/dL — ABNORMAL LOW (ref 3.5–5.0)
Alkaline Phosphatase: 45 U/L (ref 38–126)
Anion gap: 7 (ref 5–15)
BUN: 12 mg/dL (ref 6–20)
CO2: 25 mmol/L (ref 22–32)
Calcium: 8.2 mg/dL — ABNORMAL LOW (ref 8.9–10.3)
Chloride: 105 mmol/L (ref 98–111)
Creatinine, Ser: 0.53 mg/dL (ref 0.44–1.00)
GFR calc Af Amer: >60 mL/min (ref >60)
GFR calc non Af Amer: >60 mL/min (ref >60)
Glucose, Bld: 92 mg/dL (ref 70–99)
Potassium: 3.9 mmol/L (ref 3.5–5.1)
Sodium: 137 mmol/L (ref 135–145)
Total Bilirubin: 0.5 mg/dL (ref 0.3–1.2)
Total Protein: 5.3 g/dL — ABNORMAL LOW (ref 6.5–8.1)

## 2020-04-02 LAB — CEA: CEA: 5.6 ng/mL — ABNORMAL HIGH (ref 0.0–4.7)

## 2020-04-02 MED ORDER — HYDROMORPHONE HCL 1 MG/ML IJ SOLN
0.5000 mg | INTRAMUSCULAR | Status: DC | PRN
  Administered 2020-04-02 (×2): 1 mg via INTRAVENOUS
  Administered 2020-04-02 (×2): 2 mg via INTRAVENOUS
  Administered 2020-04-02: 1 mg via INTRAVENOUS
  Administered 2020-04-03: 2 mg via INTRAVENOUS
  Administered 2020-04-03 (×2): 1 mg via INTRAVENOUS
  Administered 2020-04-03 (×2): 2 mg via INTRAVENOUS
  Administered 2020-04-03: 1 mg via INTRAVENOUS
  Administered 2020-04-03 (×3): 2 mg via INTRAVENOUS
  Administered 2020-04-04 – 2020-04-06 (×8): 1 mg via INTRAVENOUS
  Administered 2020-04-06: 2 mg via INTRAVENOUS
  Administered 2020-04-07: 1 mg via INTRAVENOUS
  Administered 2020-04-07 – 2020-04-08 (×6): 2 mg via INTRAVENOUS
  Administered 2020-04-08: 1 mg via INTRAVENOUS
  Administered 2020-04-08 – 2020-04-11 (×24): 2 mg via INTRAVENOUS
  Filled 2020-04-02 (×6): qty 2
  Filled 2020-04-02: qty 1
  Filled 2020-04-02: qty 2
  Filled 2020-04-02 (×2): qty 1
  Filled 2020-04-02: qty 2
  Filled 2020-04-02: qty 1
  Filled 2020-04-02 (×8): qty 2
  Filled 2020-04-02: qty 1
  Filled 2020-04-02 (×2): qty 2
  Filled 2020-04-02 (×2): qty 1
  Filled 2020-04-02 (×3): qty 2
  Filled 2020-04-02: qty 1
  Filled 2020-04-02 (×3): qty 2
  Filled 2020-04-02: qty 1
  Filled 2020-04-02: qty 2
  Filled 2020-04-02: qty 1
  Filled 2020-04-02 (×2): qty 2
  Filled 2020-04-02: qty 1
  Filled 2020-04-02 (×2): qty 2
  Filled 2020-04-02: qty 1
  Filled 2020-04-02: qty 2
  Filled 2020-04-02: qty 1
  Filled 2020-04-02 (×2): qty 2
  Filled 2020-04-02 (×3): qty 1
  Filled 2020-04-02: qty 2
  Filled 2020-04-02: qty 1
  Filled 2020-04-02 (×3): qty 2
  Filled 2020-04-02: qty 1
  Filled 2020-04-02 (×3): qty 2
  Filled 2020-04-02: qty 1

## 2020-04-02 MED ORDER — ACETAMINOPHEN 325 MG PO TABS
650.0000 mg | ORAL_TABLET | Freq: Four times a day (QID) | ORAL | Status: DC | PRN
  Administered 2020-04-02 – 2020-04-05 (×3): 650 mg via ORAL
  Filled 2020-04-02 (×3): qty 2

## 2020-04-02 NOTE — Progress Notes (Signed)
Subjective/Chief Complaint: Still complaining of pain   Objective: Vital signs in last 24 hours: Temp:  [97.8 F (36.6 C)-99.2 F (37.3 C)] 97.8 F (36.6 C) (07/05 0634) Pulse Rate:  [77-128] 106 (07/05 0634) Resp:  [16-19] 19 (07/05 0634) BP: (108-134)/(68-89) 117/70 (07/05 0634) SpO2:  [91 %-97 %] 91 % (07/05 0634) Last BM Date: 03/31/20  Intake/Output from previous day: 07/04 0701 - 07/05 0700 In: 3162.7 [P.O.:360; I.V.:2193.5; IV Piggyback:609.2] Out: 800 [Urine:800] Intake/Output this shift: No intake/output data recorded.  General appearance: alert and cooperative Resp: clear to auscultation bilaterally Cardio: regular rate and rhythm GI: diffusely tender  Lab Results:  Recent Labs    04/01/20 0514 04/02/20 0605  WBC 7.4 9.9  HGB 11.6* 10.3*  HCT 36.8 33.6*  PLT 162 162   BMET Recent Labs    04/01/20 0514 04/02/20 0605  NA 137 137  K 4.0 3.9  CL 105 105  CO2 23 25  GLUCOSE 95 92  BUN 13 12  CREATININE 0.68 0.53  CALCIUM 8.0* 8.2*   PT/INR Recent Labs    03/31/20 1604  LABPROT 15.2  INR 1.3*   ABG No results for input(s): PHART, HCO3 in the last 72 hours.  Invalid input(s): PCO2, PO2  Studies/Results: CT ABDOMEN PELVIS W CONTRAST  Result Date: 03/31/2020 CLINICAL DATA:  39 year old female with history of left lower quadrant abdominal pain. EXAM: CT ABDOMEN AND PELVIS WITH CONTRAST TECHNIQUE: Multidetector CT imaging of the abdomen and pelvis was performed using the standard protocol following bolus administration of intravenous contrast. CONTRAST:  150mL OMNIPAQUE IOHEXOL 300 MG/ML  SOLN COMPARISON:  CT the abdomen and pelvis 03/12/2020. FINDINGS: Lower chest: Unremarkable. 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 No other new hepatic lesions. No intra or  extrahepatic biliary ductal dilatation. Gallbladder is normal in appearance. Pancreas: No pancreatic mass. No pancreatic ductal dilatation. No peripancreatic fluid collections or inflammatory changes. Spleen: Unremarkable. Adrenals/Urinary Tract: Bilateral kidneys and bilateral adrenal glands are unremarkable in appearance. No hydroureteronephrosis. Urinary bladder is normal in appearance. Stomach/Bowel: Normal appearance of the stomach. No pathologic dilatation of small bowel or colon. Previous the noted lesion in the mid sigmoid colon appears larger than the prior examination estimated to measure approximately 6.7 x 3.2 x 4.4 cm (coronal image 48 of series 4 and sagittal image 78 of series 5). Adjacent to this there is a very complex collection of gas and fluid which is now intimately associated with the left adnexa measuring approximately 6.5 x 6.2 x 6.6 cm (axial image 75 of series 2 and coronal image 52 of series 4). This collection has fluid of heterogeneous attenuation and multiple internal locules of gas, as well as multiple areas of internal enhancement. This is associated with a serpiginous structure with peripheral enhancement in the left adnexal region, likely to represent dilated fallopian tube which also contains gas and fluid. Notably, this collection appears to communicate with the previously described colonic mass across the midline best appreciated on axial image 70 of series 2. Normal appendix. Vascular/Lymphatic: No significant atherosclerotic disease, aneurysm or dissection noted in the abdominal or pelvic vasculature. Multiple enlarged lymph nodes in the pelvis measuring up to 1.2 cm in short axis adjacent to the inferior mesenteric artery (axial image 61 of series 2). Borderline enlarged retroperitoneal lymph nodes are also noted measuring  up to 8 mm in short axis. Reproductive: IUD present in the uterus. Uterus and right ovary are otherwise unremarkable in appearance. Large complex gas and  fluid collection intimately associated with the left adnexa (discussed above). Tubular serpiginous area with rim enhancement also noted in the left adnexal region. Other: Small volume of ascites.  No pneumoperitoneum. Musculoskeletal: There are no aggressive appearing lytic or blastic lesions noted in the visualized portions of the skeleton. IMPRESSION: 1. Previously noted sigmoid colon mass appears increased in size, and again appears to be associated with a focal contained perforation which crosses the midline and has fistulized into the left adnexal region where there is now what appears to be a large left tubo-ovarian abscess, as detailed above. This is also associated with multiple enlarged lymph nodes in the pelvis measuring up to 1.2 cm in short axis and borderline enlarged retroperitoneal lymph nodes, concerning for metastatic disease. In addition, previously suspected metastatic lesion in segment 7 of the liver has enlarged. 2. Small volume of ascites. 3. Additional incidental findings, as above. These results were called by telephone at the time of interpretation on 03/31/2020 at 11:58 am to provider [facility], who verbally acknowledged these results. Electronically Signed   By: Vinnie Langton M.D.   On: 03/31/2020 12:00    Anti-infectives: Anti-infectives (From admission, onward)   Start     Dose/Rate Route Frequency Ordered Stop   03/31/20 2000  ceFEPIme (MAXIPIME) 2 g in sodium chloride 0.9 % 100 mL IVPB     Discontinue     2 g 200 mL/hr over 30 Minutes Intravenous Every 8 hours 03/31/20 1408     03/31/20 2000  metroNIDAZOLE (FLAGYL) IVPB 500 mg     Discontinue     500 mg 100 mL/hr over 60 Minutes Intravenous Every 8 hours 03/31/20 1410     03/31/20 1600  ceFEPIme (MAXIPIME) 2 g in sodium chloride 0.9 % 100 mL IVPB  Status:  Discontinued        2 g 200 mL/hr over 30 Minutes Intravenous  Once 03/31/20 1549 03/31/20 1551   03/31/20 1200  piperacillin-tazobactam (ZOSYN) IVPB 3.375 g         3.375 g 100 mL/hr over 30 Minutes Intravenous  Once 03/31/20 1159 03/31/20 1341      Assessment/Plan: s/p * No surgery found * continue bowel rest and IV abx  No improvement so far. I suspect she is going to need surgery in the near future for this perforated colon with abscess Will discuss with primary team in am  LOS: 2 days    Autumn Messing III 04/02/2020

## 2020-04-02 NOTE — Progress Notes (Signed)
Patient's pain is 10/10 despite IV pain control efforts. Patient voiced to RN that she has started her cycle and would like tylenol for cramping. See new orders.

## 2020-04-02 NOTE — Progress Notes (Signed)
PROGRESS NOTE    Stephanie [name]  KCL:[id number] DOB: Sep 17, 1981 DOA: 03/31/2020 PCP: Patient, [name]   Brief Narrative:  Stephanie [name] is a 39 y.o. female with medical history significant of recent admission from June 14 to June 18 with abdominal pain and abscess with a question of sigmoid colon malignancy versus diverticulitis.  At that time she had an abnormal CT scan which also showed a liver mass.  She had a barely elevated CEA at 4.9.  She was placed on IV antibiotics, an attempt was made at liver biopsy however ultrasound suggested it might be a hemangioma.  An MRI was equivocal.  She was sent home on Augmentin which she completed and was doing well with until the last few days.  She has continued to have blood in her stool.  Stools are soft and not fully formed.  She is passing gas.  She has had increasing nausea with emesis over the last 12 hours.  She reports ongoing lower abdominal cramping that is worse in the left lower quadrant.  Her pain was 10 out of 10 requiring IV Dilaudid in the ED.  She has an IUD in place, no sexual partner for the last 1 year.  She denies vaginal discharge, vaginal bleeding, dysuria, hematuria, chest pain, or shortness of breath. In the ED she was sent for CT of the abdomen and pelvis which showed enlarging sigmoid colonic mass which appears to be associated with focal perforation and has fistulized into the left adnexal region involving the left TOA.  There are multiple enlarged lymph nodes which are bigger than before measuring up to 1.2 cm.  The previously suspected metastatic lesion in the liver is also enlarged, small volume ascites.  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[place])   Peritonitis with abscess of intestine (HCC)   Intra-abdominal abscess (HCC)   Sepsis secondary to likely intra-abdominal abscess, cannot rule out malignancy, with fistulization into the left adnexa, POA  Unfortunately due to location of this  lesion drain or percutaneous approach is not possible per discussion with interventional radiology or GI  General surgery is following, currently recommending IV antibiotics - possible procedure in the next 24 hours per their expertise  Cefepime plus Flagyl for intra-abdominal abscess with presumed malignancy  Repeat CEA 5.6  Continue IV fluids while diet is limited -currently on clears per surgery - likely to be NPO given possible procedure as above  Questionable colorectal carcinoma originating from the sigmoid colon  Presumed due to enlarging mass on CT, enlarging lymph nodes, enlarging liver mass.   Reviewed with general surgery, with infection present would likely need a colostomy if surgery had to be done right now. Surgery recommending endoscopy as above  Intractable N/V/Pain  Improving drastically since admission, continue to increase Dilaudid given worsening pain today  Continue treatment as outlined above  Blood loss anemia on chronic IRON def anemia  Scant bright red blood per rectum noted on previous exam as well as with recent BM  Hgb minimally downtrending with IV fluids  Moderate protein caloric malnutrition? Albumin 2.6  In the setting of above  Treat intractable nausea vomiting abdominal pain, advance diet as tolerated once cleared by consults, currently on clears will likely need to be n.p.o. at some point for endoscopy.   DVT prophylaxis: SCD/Compression stockings given questionable GI bleed as above Code Status: Full code  Family Communication: None present Status is: Inpatient  Dispo: The patient is from: Home  Anticipated d/c is to: Home              Anticipated d/c date is: Pending clinical course, likely 72 to 96 hours              Patient currently not medically stable for discharge given ongoing need for IV fluids IV antiemetics and pain control. Requires further evaluation with surgery, interventional radiology and likely GI for  ongoing imaging and procedure.  Consultants:   GI, general surgery, interventional radiology  Procedures:   None scheduled today, pending further evaluation  Antimicrobials:  Cefepime, Flagyl initiated Apr 08, 2020  Subjective: No acute issues or events overnight, patient now noting abdominal cramping concern for initiation of menses, abdominal pain and nausea somewhat worsening today increase Dilaudid and Zofran as above.  She remains somewhat anxious about possible need for procedure given neither IR nor GI were able to assist surgery for biopsy or oncology.  Otherwise denies headache, fevers, chills, chest pain, shortness of breath.  Objective: Vitals:   04/01/20 1844 04/01/20 2231 04/02/20 0201 04/02/20 0634  BP:  119/77 119/68 117/70  Pulse: (!) 102 (!) 109 (!) 103 (!) 106  Resp:  18 18 19   Temp:  99 F (37.2 C) 98.3 F (36.8 C) 97.8 F (36.6 C)  TempSrc:  Oral Oral Oral  SpO2:  96% 93% 91%  Weight:      Height:        Intake/Output Summary (Last 24 hours) at 04/02/2020 0721 Last data filed at 04/02/2020 0553 Gross per 24 hour  Intake 3162.68 ml  Output 800 ml  Net 2362.68 ml   Filed Weights   Apr 08, 2020 0850  Weight: 77.1 kg    Examination:  General:  Pleasantly resting in bed, No acute distress. HEENT:  Normocephalic atraumatic.  Sclerae nonicteric, noninjected.  Extraocular movements intact bilaterally. Neck:  Without mass or deformity.  Trachea is midline. Lungs:  Clear to auscultate bilaterally without rhonchi, wheeze, or rales. Heart:  Regular rate and rhythm.  Without murmurs, rubs, or gallops. Abdomen:  Soft, diffusely tender.  Otherwise without guarding or rebound. Extremities: Without cyanosis, clubbing, edema, or obvious deformity. Vascular:  Dorsalis pedis and posterior tibial pulses palpable bilaterally. Skin:  Warm and dry, no erythema, no ulcerations.   Data Reviewed: I have personally reviewed following labs and imaging studies  CBC: Recent Labs    Lab April 08, 2020 0910 04/01/20 0514 04/02/20 0605  WBC 23.2* 7.4 9.9  HGB 12.6 11.6* 10.3*  HCT 41.0 36.8 33.6*  MCV 82.0 81.1 82.6  PLT 225 162 076   Basic Metabolic Panel: Recent Labs  Lab 04-08-20 0910 04/01/20 0514 04/02/20 0605  NA 134* 137 137  K 3.7 4.0 3.9  CL 101 105 105  CO2 20* 23 25  GLUCOSE 114* 95 92  BUN 9 13 12   CREATININE 0.90 0.68 0.53  CALCIUM 8.8* 8.0* 8.2*   GFR: Estimated Creatinine Clearance: 98.7 mL/min (by C-G formula based on SCr of 0.53 mg/dL). Liver Function Tests: Recent Labs  Lab 2020/04/08 0910 04/01/20 0514 04/02/20 0605  AST 21 16 14*  ALT 17 16 13   ALKPHOS 68 43 45  BILITOT 1.2 0.9 0.5  PROT 7.9 5.7* 5.3*  ALBUMIN 3.9 2.6* 2.4*   Recent Labs  Lab 2020/04/08 0910  LIPASE 18   No results for input(s): AMMONIA in the last 168 hours. Coagulation Profile: Recent Labs  Lab 2020-04-08 1604  INR 1.3*   Cardiac Enzymes: No results for input(s): CKTOTAL, CKMB, CKMBINDEX, TROPONINI in  the last 168 hours. BNP (last 3 results) No results for input(s): PROBNP in the last 8760 hours. HbA1C: No results for input(s): HGBA1C in the last 72 hours. CBG: No results for input(s): GLUCAP in the last 168 hours. Lipid Profile: No results for input(s): CHOL, HDL, LDLCALC, TRIG, CHOLHDL, LDLDIRECT in the last 72 hours. Thyroid Function Tests: No results for input(s): TSH, T4TOTAL, FREET4, T3FREE, THYROIDAB in the last 72 hours. Anemia Panel: No results for input(s): VITAMINB12, FOLATE, FERRITIN, TIBC, IRON, RETICCTPCT in the last 72 hours. Sepsis Labs: Recent Labs  Lab 03/31/20 1253  LATICACIDVEN 1.0    Recent Results (from the past 240 hour(s))  Blood culture (routine x 2)     Status: None (Preliminary result)   Collection Time: 03/31/20 12:52 PM   Specimen: BLOOD LEFT FOREARM  Result Value Ref Range Status   Specimen Description   Final    BLOOD LEFT FOREARM Performed at [facility], [place] [address], [place], [place] [address]     Special Requests   Final    BOTTLES DRAWN AEROBIC AND ANAEROBIC Blood Culture results may not be optimal due to an inadequate volume of blood received in culture bottles Performed at [facility] [address] [place], [place] [address]    Culture   Final    NO GROWTH < 24 HOURS Performed at [facility] [address], [place], [place] [address]    Report Status PENDING  Incomplete  Blood culture (routine x 2)     Status: None (Preliminary result)   Collection Time: 03/31/20 12:53 PM   Specimen: BLOOD  Result Value Ref Range Status   Specimen Description   Final    BLOOD LEFT WRIST Performed at [facility] [address] [place], [place] [address]    Special Requests   Final    BOTTLES DRAWN AEROBIC AND ANAEROBIC Blood Culture adequate volume Performed at [facility] [address], [place], [place] [address]    Culture   Final    NO GROWTH < 24 HOURS Performed at [facility] [address], [place],  [address]    Report Status PENDING  Incomplete  SARS Coronavirus 2 by RT PCR (hospital order, performed in [facility] hospital lab) Nasopharyngeal Nasopharyngeal Swab     Status: None   Collection Time: 03/31/20 12:53 PM   Specimen: Nasopharyngeal Swab  Result Value Ref Range Status   SARS Coronavirus 2 NEGATIVE NEGATIVE Final    Comment: (NOTE) SARS-CoV-2 target nucleic acids are NOT DETECTED.  The SARS-CoV-2 RNA is generally detectable in upper and lower respiratory specimens during the acute phase of infection. The lowest concentration of SARS-CoV-2 viral copies this assay can detect is 250 copies / mL. A negative result does not preclude SARS-CoV-2 infection and should not be used as the sole basis for treatment or other patient management decisions.  A negative result may occur with improper specimen collection / handling, submission of specimen other than nasopharyngeal swab,  presence of viral mutation(s) within the areas targeted by this assay, and inadequate number of viral copies (<250 copies / mL). A negative result must be combined with clinical observations, patient history, and epidemiological information.  Fact Sheet for Patients:   StrictlyIdeas.no  Fact Sheet for Healthcare Providers: BankingDealers.co.za  This test is not yet approved or  cleared by the [place] FDA and has been authorized for detection and/or diagnosis of SARS-CoV-2 by FDA under  an Emergency Use Authorization (EUA).  This EUA will remain in effect (meaning this test can be used) for the duration of the COVID-19 declaration under Section 564(b)(1) of the Act, 21 U.S.C. section 360bbb-3(b)(1), unless the authorization is terminated or revoked sooner.  Performed at [facility], [place] [address], [place], [place] [address]          Radiology Studies: CT ABDOMEN PELVIS W CONTRAST  Result Date: 03/31/2020 CLINICAL DATA:  39 year old female with history of left lower quadrant abdominal pain. EXAM: CT ABDOMEN AND PELVIS WITH CONTRAST TECHNIQUE: Multidetector CT imaging of the abdomen and pelvis was performed using the standard protocol following bolus administration of intravenous contrast. CONTRAST:  152mL OMNIPAQUE IOHEXOL 300 MG/ML  SOLN COMPARISON:  CT the abdomen and pelvis 03/12/2020. FINDINGS: Lower chest: Unremarkable. 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 No other new hepatic lesions. No intra or extrahepatic biliary ductal dilatation. Gallbladder is normal in appearance. Pancreas: No pancreatic mass. No pancreatic ductal dilatation. No peripancreatic fluid collections or inflammatory changes. Spleen: Unremarkable. Adrenals/Urinary  Tract: Bilateral kidneys and bilateral adrenal glands are unremarkable in appearance. No hydroureteronephrosis. Urinary bladder is normal in appearance. Stomach/Bowel: Normal appearance of the stomach. No pathologic dilatation of small bowel or colon. Previous the noted lesion in the mid sigmoid colon appears larger than the prior examination estimated to measure approximately 6.7 x 3.2 x 4.4 cm (coronal image 48 of series 4 and sagittal image 78 of series 5). Adjacent to this there is a very complex collection of gas and fluid which is now intimately associated with the left adnexa measuring approximately 6.5 x 6.2 x 6.6 cm (axial image 75 of series 2 and coronal image 52 of series 4). This collection has fluid of heterogeneous attenuation and multiple internal locules of gas, as well as multiple areas of internal enhancement. This is associated with a serpiginous structure with peripheral enhancement in the left adnexal region, likely to represent dilated fallopian tube which also contains gas and fluid. Notably, this collection appears to communicate with the previously described colonic mass across the midline best appreciated on axial image 70 of series 2. Normal appendix. Vascular/Lymphatic: No significant atherosclerotic disease, aneurysm or dissection noted in the abdominal or pelvic vasculature. Multiple enlarged lymph nodes in the pelvis measuring up to 1.2 cm in short axis adjacent to the inferior mesenteric artery (axial image 61 of series 2). Borderline enlarged retroperitoneal lymph nodes are also noted measuring up to 8 mm in short axis. Reproductive: IUD present in the uterus. Uterus and right ovary are otherwise unremarkable in appearance. Large complex gas and fluid collection intimately associated with the left adnexa (discussed above). Tubular serpiginous area with rim enhancement also noted in the left adnexal region. Other: Small volume of ascites.  No pneumoperitoneum. Musculoskeletal: There  are no aggressive appearing lytic or blastic lesions noted in the visualized portions of the skeleton. IMPRESSION: 1. Previously noted sigmoid colon mass appears increased in size, and again appears to be associated with a focal contained perforation which crosses the midline and has fistulized into the left adnexal region where there is now what appears to be a large left tubo-ovarian abscess, as detailed above. This is also associated with multiple enlarged lymph nodes in the pelvis measuring up to 1.2 cm in short axis and borderline enlarged  retroperitoneal lymph nodes, concerning for metastatic disease. In addition, previously suspected metastatic lesion in segment 7 of the liver has enlarged. 2. Small volume of ascites. 3. Additional incidental findings, as above. These results were called by telephone at the time of interpretation on 03/31/2020 at 11:58 am to provider [facility], who verbally acknowledged these results. Electronically Signed   By: Vinnie Langton M.D.   On: 03/31/2020 12:00    Scheduled Meds: Continuous Infusions: . ceFEPime (MAXIPIME) IV 2 g (04/02/20 0550)  . famotidine (PEPCID) IV 20 mg (04/01/20 2213)  . lactated ringers 125 mL/hr at 04/02/20 0236  . metronidazole 500 mg (04/02/20 0551)     LOS: 2 days   Time spent: 65min  Rayvin Abid C Merelin Human, DO [facility]  If 7PM-7AM, please contact night-coverage [email or web address]  04/02/2020, 7:21 AM

## 2020-04-03 LAB — COMPREHENSIVE METABOLIC PANEL
ALT: 10 U/L (ref 0–44)
AST: 12 U/L — ABNORMAL LOW (ref 15–41)
Albumin: 2.2 g/dL — ABNORMAL LOW (ref 3.5–5.0)
Alkaline Phosphatase: 50 U/L (ref 38–126)
Anion gap: 8 (ref 5–15)
BUN: 10 mg/dL (ref 6–20)
CO2: 26 mmol/L (ref 22–32)
Calcium: 7.9 mg/dL — ABNORMAL LOW (ref 8.9–10.3)
Chloride: 103 mmol/L (ref 98–111)
Creatinine, Ser: 0.52 mg/dL (ref 0.44–1.00)
GFR calc Af Amer: >60 mL/min (ref >60)
GFR calc non Af Amer: >60 mL/min (ref >60)
Glucose, Bld: 95 mg/dL (ref 70–99)
Potassium: 3.7 mmol/L (ref 3.5–5.1)
Sodium: 137 mmol/L (ref 135–145)
Total Bilirubin: 0.8 mg/dL (ref 0.3–1.2)
Total Protein: 5 g/dL — ABNORMAL LOW (ref 6.5–8.1)

## 2020-04-03 LAB — CBC
HCT: 32.7 % — ABNORMAL LOW (ref 36.0–46.0)
Hemoglobin: 10 g/dL — ABNORMAL LOW (ref 12.0–15.0)
MCH: 25.3 pg — ABNORMAL LOW (ref 26.0–34.0)
MCHC: 30.6 g/dL (ref 30.0–36.0)
MCV: 82.8 fL (ref 80.0–100.0)
Platelets: 174 10*3/uL (ref 150–400)
RBC: 3.95 MIL/uL (ref 3.87–5.11)
RDW: 21.2 % — ABNORMAL HIGH (ref 11.5–15.5)
WBC: 10.8 10*3/uL — ABNORMAL HIGH (ref 4.0–10.5)
nRBC: 0 % (ref 0.0–0.2)

## 2020-04-03 LAB — SURGICAL PCR SCREEN
MRSA, PCR: NEGATIVE
Staphylococcus aureus: NEGATIVE

## 2020-04-03 MED ORDER — SODIUM CHLORIDE 0.9 % IV SOLN
2.0000 g | INTRAVENOUS | Status: AC
  Administered 2020-04-04: 2 g via INTRAVENOUS
  Filled 2020-04-03 (×2): qty 2

## 2020-04-03 NOTE — Progress Notes (Signed)
CC: Abdominal pain, nausea, vomiting  Subjective: She is up to the bathroom when I first went in the room.  When I came back she was sitting on the bedside, tearful.  She says it hurts actually more when she is laying down and sitting up.  She is waiting on pain medicines.  She is extremely anxious over not having health insurance and what is going to happen next.  She is having some gas but no BM.  Objective: Vital signs in last 24 hours: Temp:  [98.2 F (36.8 C)-98.4 F (36.9 C)] 98.4 F (36.9 C) (07/06 0519) Pulse Rate:  [106-108] 108 (07/06 0519) Resp:  [17-18] 18 (07/06 0519) BP: (123-133)/(71-76) 123/74 (07/06 0519) SpO2:  [79 %-89 %] 79 % (07/06 0519) Last BM Date: 03/31/20 680 p.o. 3000 IV Urine x8 Stool x1 Afebrile, tachycardic vital signs are stable BMP is stable WBC 10.8 H/H 10/32.7 Platelets 174,000. Intake/Output from previous day: 07/05 0701 - 07/06 0700 In: 3599.6 [P.O.:680; I.V.:2228.5; IV Piggyback:691.1] Out: 0  Intake/Output this shift: No intake/output data recorded.  General appearance: alert, cooperative and no distress Resp: clear to auscultation bilaterally GI: She is sitting up on the side of the bed.  She does not want to lie down because of pain with movement.  She describes generalized abdominal pain.  On exam sitting up there is no focal tenderness and she cannot tell me one side is more tender than the other.  She said I could not do that exam with her lying down.  Bowel sounds are hypoactive some flatus.  No BM.  Lab Results:  Recent Labs    04/02/20 0605 04/03/20 0430  WBC 9.9 10.8*  HGB 10.3* 10.0*  HCT 33.6* 32.7*  PLT 162 174    BMET Recent Labs    04/02/20 0605 04/03/20 0430  NA 137 137  K 3.9 3.7  CL 105 103  CO2 25 26  GLUCOSE 92 95  BUN 12 10  CREATININE 0.53 0.52  CALCIUM 8.2* 7.9*   PT/INR Recent Labs    03/31/20 1604  LABPROT 15.2  INR 1.3*    Recent Labs  Lab 03/31/20 0910 04/01/20 0514  04/02/20 0605 04/03/20 0430  AST 21 16 14* 12*  ALT 17 16 13 10   ALKPHOS 68 43 45 50  BILITOT 1.2 0.9 0.5 0.8  PROT 7.9 5.7* 5.3* 5.0*  ALBUMIN 3.9 2.6* 2.4* 2.2*     Lipase     Component Value Date/Time   LIPASE 18 03/31/2020 0910     Medications:   . ceFEPime (MAXIPIME) IV 2 g (04/03/20 0549)  . famotidine (PEPCID) IV 20 mg (04/03/20 0922)  . lactated ringers 125 mL/hr at 04/03/20 0548  . metronidazole 500 mg (04/03/20 0551)    Assessment/Plan Intractable nausea/vomiting/pain Chronic blood loss anemia Protein calorie malnutrition  Enlarging pelvic mass/pelvic abscess  - Hospitalized 6/13-6/18/2021-localized/contained sigmoid colon perforation in the setting of        phlegmon and extensive colonic inflammation; 2 cm right hepatic lobe mass    Mechanical obstruction CEA 4.9 >>5.6  - CT 03/31/2020: Previously noted sigmoid mass increased in size and again appears to be associated with a focally contained perforation which crosses the midline and is fistulized into the left adnexal region where there is now what appears to be a left tubo-ovarian abscess; borderline enlarged retroperitoneal lymphadenopathy; cyst suspected metastatic lesion in segment 7 of the liver has enlarged.  FEN: IV fluids/n.p.o. 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  She will most likely need partial colectomy, Hartman's procedure, and then oncology follow-up.  Consider CT of the chest.  Update labs for tomorrow.  She is going to try and work on getting some insurance set up today.   LOS: 3 days    Rupa Lagan 04/03/2020 Please see Amion

## 2020-04-03 NOTE — TOC Initial Note (Signed)
Transition of Care [facility]) - Initial/Assessment Note    Patient Details  Name: Stephanie [name] MRN: [id number] Date of Birth: 05-10-1981  Transition of Care [facility]) CM/SW Contact:    Lennart Pall, LCSW Phone Number: 04/03/2020, 1:28 PM  Clinical Narrative:                 Met with pt to discuss concerns about getting insurance coverage set up.  Pt presents very frustrated with her current situation and focused on wanting to "go ahead and do the surgery.. they should have done it last time I was here.... am I supposed to die here before they get to it."  She is suspecting that her lack of insurance is cause for the delay - attempted to explain that our health system would not deny a needed service based on insurance.  Allowed her to ventilate and it is clear that she is frightened and overwhelmed with anticipated large medical bills now and moving forward.   I did explain that I am reaching out to [facility] financial counseling dept to ask they screen for Medicaid eligibility. Pt quickly states, "I won't qualify.  That would just be wasting everybody's time."  She has had Medicaid in the past and does not want to pursue Medicaid again.  I explained that financial counseling could screen for possible discounted programs and she is agreeable with my reaching out to this dept - done.  Pt is also planning to use her computer to look into [employer]. Pt notes is has been difficult to do what she needs on her computer because she is in so much pain.  Pt does note that he has support from family here and that she is sharing custody of her daughter with the daughter's father.  Pt appears to be in pain and grimacing throughout our brief conversation.  Will continue to be available for support and any other TOC needs.  *of note, during pt's last admission, she was provided with information on [place] for possible medical "home".    Expected Discharge Plan: Home/Self  Care Barriers to Discharge: Continued Medical Work up   Patient Goals and CMS Choice Patient states their goals for this hospitalization and ongoing recovery are:: to get insurance and treatment CMS Medicare.gov Compare Post Acute Care list provided to:: Patient Choice offered to / list presented to : Patient  Expected Discharge Plan and Services Expected Discharge Plan: Home/Self Care In-house Referral: Clinical Social Work     Living arrangements for the past 2 months: Single Family Home                                      Prior Living Arrangements/Services Living arrangements for the past 2 months: Single Family Home Lives with:: Minor Children Patient language and need for interpreter reviewed:: Yes Do you feel safe going back to the place where you live?: Yes      Need for Family Participation in Patient Care: Yes (Comment) Care giver support system in place?: Yes (comment)   Criminal Activity/Legal Involvement Pertinent to Current Situation/Hospitalization: No - Comment as needed  Activities of Daily Living Home Assistive Devices/Equipment: None ADL Screening (condition at time of admission) Patient's cognitive ability adequate to safely complete daily activities?: Yes Is the patient deaf or have difficulty hearing?: No Does the patient have difficulty seeing, even when wearing glasses/contacts?: No Does the patient  have difficulty concentrating, remembering, or making decisions?: No Patient able to express need for assistance with ADLs?: Yes Does the patient have difficulty dressing or bathing?: No Independently performs ADLs?: Yes (appropriate for developmental age) Does the patient have difficulty walking or climbing stairs?: No Weakness of Legs: None Weakness of Arms/Hands: None  Permission Sought/Granted                  Emotional Assessment Appearance:: Appears stated age Attitude/Demeanor/Rapport: Complaining, Angry Affect (typically  observed): Agitated, Frustrated, Anxious Orientation: : Oriented to Self, Oriented to Place, Oriented to  Time, Oriented to Situation Alcohol / Substance Use: Not Applicable Psych Involvement: No (comment)  Admission diagnosis:  Malignancy ([place]) [C80.1] Intra-abdominal abscess ([place]) [K65.1] TOA (tubo-ovarian abscess) [N70.93] Patient Active Problem List   Diagnosis Date Noted  . Intra-abdominal abscess ([place]) 03/31/2020  . Lactose intolerance 03/12/2020  . Microcytic anemia 03/12/2020  . Colorectal carcinoma ([place]) 03/12/2020  . Peritonitis with abscess of intestine ([place]) 03/12/2020  . Recurrent urinary tract infection 06/01/2012  . Contraception, device intrauterine 03/23/2012  . 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 Attention deficit disorder 02/27/2010  . Anxiety state 02/27/2010   PCP:  Patient, [name] Pharmacy:   [facility] [place], [place] [address] Phone: [phone] Fax: [phone]     Social Determinants of Health (SDOH) Interventions    Readmission Risk Interventions No flowsheet data found.

## 2020-04-03 NOTE — Progress Notes (Addendum)
PROGRESS NOTE    Stephanie [name]  BWG:[id number] DOB: [birth year] DOA: 03/31/2020 PCP: Patient, [name]   Brief Narrative:  Stephanie [name] is a 39 y.o. female with medical history significant of recent admission from June 14 to June 18 with abdominal pain and abscess with a question of sigmoid colon malignancy versus diverticulitis.  At that time she had an abnormal CT scan which also showed a liver mass.  She had a barely elevated CEA at 4.9.  She was placed on IV antibiotics, an attempt was made at liver biopsy however ultrasound suggested it might be a hemangioma.  An MRI was equivocal.  She was sent home on Augmentin which she completed and was doing well with until the last few days.  She has continued to have blood in her stool.  Stools are soft and not fully formed.  She is passing gas.  She has had increasing nausea with emesis over the last 12 hours.  She reports ongoing lower abdominal cramping that is worse in the left lower quadrant.  Her pain was 10 out of 10 requiring IV Dilaudid in the ED.  She has an IUD in place, no sexual partner for the last 1 year.  She denies vaginal discharge, vaginal bleeding, dysuria, hematuria, chest pain, or shortness of breath. In the ED she was sent for CT of the abdomen and pelvis which showed enlarging sigmoid colonic mass which appears to be associated with focal perforation and has fistulized into the left adnexal region involving the left TOA.  There are multiple enlarged lymph nodes which are bigger than before measuring up to 1.2 cm.  The previously suspected metastatic lesion in the liver is also enlarged, small volume ascites.  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[facility])   Peritonitis with abscess of intestine ([facility])   Intra-abdominal abscess ([facility])  Sepsis secondary to likely intra-abdominal abscess, cannot rule out malignancy, with fistulization into the left adnexa, POA  Unfortunately due to location of this  lesion drain or percutaneous approach is not possible per discussion with interventional radiology or GI  General surgery is following, currently recommending IV antibiotics - possible procedure in the next 24 hours per their expertise  Cefepime plus Flagyl for intra-abdominal abscess with presumed malignancy   CEA 5.6  Continue IV fluids while n.p.o. perioperatively  Increase as needed pain medications due to poor control previously, now on upwards of 2 mg of Dilaudid every 2 hours  Questionable colorectal carcinoma originating from the sigmoid colon  Presumed due to enlarging mass on CT, enlarging lymph nodes, enlarging liver mass.   Reviewed with general surgery, with infection present would likely need a colostomy if surgery had to be done right now. Surgery recommending endoscopy as above  Intractable N/V/Pain, improving  Improving drastically since admission, continue to increase Dilaudid given worsening pain today  Continue treatment as outlined above  Blood loss anemia on chronic IRON def anemia  Scant bright red blood per rectum noted on previous exam as well as with recent BM  Hgb minimally downtrending with IV fluids - last Hgb around 10  Moderate protein caloric malnutrition? Albumin 2.6  In the setting of above  Treat intractable nausea vomiting abdominal pain, advance diet as tolerated once cleared by consults, currently on clears will likely need to be n.p.o. at some point for endoscopy.   DVT prophylaxis: SCD/Compression stockings given questionable GI bleed as above Code Status: Full code  Family Communication: None  present Status is: Inpatient  Dispo: The patient is from: Home              Anticipated d/c is to: Home              Anticipated d/c date is: Pending clinical course, likely 72 to 96 hours              Patient currently not medically stable for discharge given ongoing need for IV fluids IV antiemetics and pain control. Requires further  evaluation with surgery, interventional radiology and likely GI for ongoing imaging and procedure.  Consultants:   GI, general surgery, interventional radiology  Procedures:   Pending likely surgery later today  Antimicrobials:  Cefepime, Flagyl initiated 03/31/2020  Subjective: No acute issues or events overnight, patient continues to complain of abdominal pain, cramping in the pelvic region worsening with movement or ambulation.  Somewhat anxious about procedure later today, otherwise denies nausea, vomiting, shortness of breath, chest pain, headache, fevers, chills.  Objective: Vitals:   04/02/20 0634 04/02/20 1341 04/02/20 2227 04/03/20 0519  BP: 117/70 133/76 129/71 123/74  Pulse: (!) 106 (!) 106 (!) 108 (!) 108  Resp: 19 17 18 18   Temp: 97.8 F (36.6 C) 98.2 F (36.8 C) 98.3 F (36.8 C) 98.4 F (36.9 C)  TempSrc: Oral Oral Oral Oral  SpO2: 91% (!) 89% (!) 86% (!) 79%  Weight:      Height:        Intake/Output Summary (Last 24 hours) at 04/03/2020 0734 Last data filed at 04/03/2020 0208 Gross per 24 hour  Intake 3599.61 ml  Output 0 ml  Net 3599.61 ml   Filed Weights   03/31/20 0850  Weight: 77.1 kg    Examination:  General:  Pleasantly resting in bed, No acute distress. HEENT:  Normocephalic atraumatic.  Sclerae nonicteric, noninjected.  Extraocular movements intact bilaterally. Neck:  Without mass or deformity.  Trachea is midline. Lungs:  Clear to auscultate bilaterally without rhonchi, wheeze, or rales. Heart:  Regular rate and rhythm.  Without murmurs, rubs, or gallops. Abdomen:  Soft, diffusely tender. Otherwise without guarding or rebound. Extremities: Without cyanosis, clubbing, edema, or obvious deformity. Vascular:  Dorsalis pedis and posterior tibial pulses palpable bilaterally. Skin:  Warm and dry, no erythema, no ulcerations.   Data Reviewed: I have personally reviewed following labs and imaging studies  CBC: Recent Labs  Lab 03/31/20 0910  04/01/20 0514 04/02/20 0605 04/03/20 0430  WBC 23.2* 7.4 9.9 10.8*  HGB 12.6 11.6* 10.3* 10.0*  HCT 41.0 36.8 33.6* 32.7*  MCV 82.0 81.1 82.6 82.8  PLT 225 162 162 315   Basic Metabolic Panel: Recent Labs  Lab 03/31/20 0910 04/01/20 0514 04/02/20 0605 04/03/20 0430  NA 134* 137 137 137  K 3.7 4.0 3.9 3.7  CL 101 105 105 103  CO2 20* 23 25 26   GLUCOSE 114* 95 92 95  BUN 9 13 12 10   CREATININE 0.90 0.68 0.53 0.52  CALCIUM 8.8* 8.0* 8.2* 7.9*   GFR: Estimated Creatinine Clearance: 98.7 mL/min (by C-G formula based on SCr of 0.52 mg/dL). Liver Function Tests: Recent Labs  Lab 03/31/20 0910 04/01/20 0514 04/02/20 0605 04/03/20 0430  AST 21 16 14* 12*  ALT 17 16 13 10   ALKPHOS 68 43 45 50  BILITOT 1.2 0.9 0.5 0.8  PROT 7.9 5.7* 5.3* 5.0*  ALBUMIN 3.9 2.6* 2.4* 2.2*   Recent Labs  Lab 03/31/20 0910  LIPASE 18   No results for input(s):  AMMONIA in the last 168 hours. Coagulation Profile: Recent Labs  Lab 03/31/20 1604  INR 1.3*   Cardiac Enzymes: No results for input(s): CKTOTAL, CKMB, CKMBINDEX, TROPONINI in the last 168 hours. BNP (last 3 results) No results for input(s): PROBNP in the last 8760 hours. HbA1C: No results for input(s): HGBA1C in the last 72 hours. CBG: No results for input(s): GLUCAP in the last 168 hours. Lipid Profile: No results for input(s): CHOL, HDL, LDLCALC, TRIG, CHOLHDL, LDLDIRECT in the last 72 hours. Thyroid Function Tests: No results for input(s): TSH, T4TOTAL, FREET4, T3FREE, THYROIDAB in the last 72 hours. Anemia Panel: No results for input(s): VITAMINB12, FOLATE, FERRITIN, TIBC, IRON, RETICCTPCT in the last 72 hours. Sepsis Labs: Recent Labs  Lab 03/31/20 1253  LATICACIDVEN 1.0    Recent Results (from the past 240 hour(s))  Blood culture (routine x 2)     Status: None (Preliminary result)   Collection Time: 03/31/20 12:52 PM   Specimen: BLOOD LEFT FOREARM  Result Value Ref Range Status   Specimen Description    Final    BLOOD LEFT FOREARM Performed at [facility], [place] [address] [place], [place] [address]    Special Requests   Final    BOTTLES DRAWN AEROBIC AND ANAEROBIC Blood Culture results may not be optimal due to an inadequate volume of blood received in culture bottles Performed at [facility] [address], [place], [place] [address]    Culture   Final    NO GROWTH 2 DAYS Performed at [facility] [address] [place], [place] [address]    Report Status PENDING  Incomplete  Blood culture (routine x 2)     Status: None (Preliminary result)   Collection Time: 03/31/20 12:53 PM   Specimen: BLOOD  Result Value Ref Range Status   Specimen Description   Final    BLOOD LEFT WRIST Performed at [facility] [address] [place], [place] [address]    Special Requests   Final    BOTTLES DRAWN AEROBIC AND ANAEROBIC Blood Culture adequate volume Performed at [facility] [address], [place], [place] [address]    Culture   Final    NO GROWTH 2 DAYS Performed at [facility] [address] [place], [place] [address]    Report Status PENDING  Incomplete  SARS Coronavirus 2 by RT PCR (hospital order, performed in [facility] hospital lab) Nasopharyngeal Nasopharyngeal Swab     Status: None   Collection Time: 03/31/20 12:53 PM   Specimen: Nasopharyngeal Swab  Result Value Ref Range Status   SARS Coronavirus 2 NEGATIVE NEGATIVE Final    Comment: (NOTE) SARS-CoV-2 target nucleic acids are NOT DETECTED.  The SARS-CoV-2 RNA is generally detectable in upper and lower respiratory specimens during the acute phase of infection. The lowest concentration of SARS-CoV-2 viral copies this assay can detect is 250 copies / mL. A negative result does not preclude SARS-CoV-2 infection and should not be used as the sole basis for treatment or other patient management decisions.  A negative result may occur  with improper specimen collection / handling, submission of specimen other than nasopharyngeal swab, presence of viral mutation(s) within the areas targeted by this assay, and inadequate number of viral copies (<250 copies / mL). A negative result must be combined with clinical observations, patient history, and epidemiological information.  Fact Sheet for Patients:   StrictlyIdeas.no  Fact Sheet for Healthcare Providers: BankingDealers.co.za  This test is not yet approved or  cleared by the [place] and has been authorized for detection and/or diagnosis of SARS-CoV-2 by FDA under an Emergency Use Authorization (EUA).  This EUA will remain in effect (meaning this test can be used) for the duration of the COVID-19 declaration under Section 564(b)(1) of the Act, 21 U.S.C. section 360bbb-3(b)(1), unless the authorization is terminated or revoked sooner.  Performed at [facility], [place] [address] [place], [place] [address]      Radiology Studies: No results found.  Scheduled Meds: Continuous Infusions: . ceFEPime (MAXIPIME) IV 2 g (04/03/20 0549)  . famotidine (PEPCID) IV 20 mg (04/03/20 0000)  . lactated ringers 125 mL/hr at 04/03/20 0548  . metronidazole 500 mg (04/03/20 0551)     LOS: 3 days   Time spent: 72min  Joycelin Radloff C Surya Folden, DO [facility]  If 7PM-7AM, please contact night-coverage [email or web address]  04/03/2020, 7:34 AM

## 2020-04-03 NOTE — Progress Notes (Signed)
Patient reports soreness and "bruised like looking" in her vagina which the patient explains that " it's not usually like that " , RN assessed and did not see anything remarkable but with some slight redness ,advised the patient to carefully wipe/clean whenever she has to use the bathroom and that we'll let the MD know first thing in the morning for further evaluation.We will continue to monitor.

## 2020-04-03 NOTE — Plan of Care (Signed)

## 2020-04-04 ENCOUNTER — Inpatient Hospital Stay (HOSPITAL_COMMUNITY): Payer: Medicaid Other

## 2020-04-04 ENCOUNTER — Inpatient Hospital Stay (HOSPITAL_COMMUNITY): Payer: Medicaid Other | Admitting: Anesthesiology

## 2020-04-04 ENCOUNTER — Encounter (HOSPITAL_COMMUNITY)
Admission: EM | Disposition: A | Discharge status: Home Health | Payer: Self-pay | Source: Home / Self Care | Attending: Internal Medicine

## 2020-04-04 ENCOUNTER — Encounter (HOSPITAL_COMMUNITY): Payer: Self-pay | Admitting: *Deleted

## 2020-04-04 ENCOUNTER — Other Ambulatory Visit: Payer: Self-pay

## 2020-04-04 DIAGNOSIS — R112 Nausea with vomiting, unspecified: Secondary | ICD-10-CM | POA: Diagnosis present

## 2020-04-04 DIAGNOSIS — E440 Moderate protein-calorie malnutrition: Secondary | ICD-10-CM | POA: Diagnosis present

## 2020-04-04 DIAGNOSIS — A419 Sepsis, unspecified organism: Secondary | ICD-10-CM | POA: Diagnosis present

## 2020-04-04 DIAGNOSIS — R0602 Shortness of breath: Secondary | ICD-10-CM

## 2020-04-04 DIAGNOSIS — D509 Iron deficiency anemia, unspecified: Secondary | ICD-10-CM

## 2020-04-04 DIAGNOSIS — N7093 Salpingitis and oophoritis, unspecified: Secondary | ICD-10-CM

## 2020-04-04 HISTORY — PX: LAPAROTOMY: SHX154

## 2020-04-04 HISTORY — PX: CYSTOSCOPY WITH STENT PLACEMENT: SHX5790

## 2020-04-04 LAB — CBC
HCT: 31.6 % — ABNORMAL LOW (ref 36.0–46.0)
Hemoglobin: 9.8 g/dL — ABNORMAL LOW (ref 12.0–15.0)
MCH: 25.7 pg — ABNORMAL LOW (ref 26.0–34.0)
MCHC: 31 g/dL (ref 30.0–36.0)
MCV: 82.9 fL (ref 80.0–100.0)
Platelets: 172 10*3/uL (ref 150–400)
RBC: 3.81 MIL/uL — ABNORMAL LOW (ref 3.87–5.11)
RDW: 21.5 % — ABNORMAL HIGH (ref 11.5–15.5)
WBC: 9.8 10*3/uL (ref 4.0–10.5)
nRBC: 0 % (ref 0.0–0.2)

## 2020-04-04 LAB — COMPREHENSIVE METABOLIC PANEL
ALT: 8 U/L (ref 0–44)
AST: 10 U/L — ABNORMAL LOW (ref 15–41)
Albumin: 2.2 g/dL — ABNORMAL LOW (ref 3.5–5.0)
Alkaline Phosphatase: 53 U/L (ref 38–126)
Anion gap: 11 (ref 5–15)
BUN: 10 mg/dL (ref 6–20)
CO2: 23 mmol/L (ref 22–32)
Calcium: 7.9 mg/dL — ABNORMAL LOW (ref 8.9–10.3)
Chloride: 105 mmol/L (ref 98–111)
Creatinine, Ser: 0.6 mg/dL (ref 0.44–1.00)
GFR calc Af Amer: >60 mL/min (ref >60)
GFR calc non Af Amer: >60 mL/min (ref >60)
Glucose, Bld: 76 mg/dL (ref 70–99)
Potassium: 3.2 mmol/L — ABNORMAL LOW (ref 3.5–5.1)
Sodium: 139 mmol/L (ref 135–145)
Total Bilirubin: 0.9 mg/dL (ref 0.3–1.2)
Total Protein: 5.3 g/dL — ABNORMAL LOW (ref 6.5–8.1)

## 2020-04-04 LAB — TYPE AND SCREEN
ABO/RH(D): O POS
Antibody Screen: NEGATIVE

## 2020-04-04 LAB — PROTIME-INR
INR: 1.4 — ABNORMAL HIGH (ref 0.8–1.2)
Prothrombin Time: 17 seconds — ABNORMAL HIGH (ref 11.4–15.2)

## 2020-04-04 LAB — APTT: aPTT: 39 seconds — ABNORMAL HIGH (ref 24–36)

## 2020-04-04 LAB — MAGNESIUM: Magnesium: 1.9 mg/dL (ref 1.7–2.4)

## 2020-04-04 SURGERY — LAPAROTOMY, EXPLORATORY
Anesthesia: General | Site: Ureter

## 2020-04-04 MED ORDER — CHLORHEXIDINE GLUCONATE 0.12 % MT SOLN
15.0000 mL | Freq: Once | OROMUCOSAL | Status: AC
  Administered 2020-04-04: 15 mL via OROMUCOSAL

## 2020-04-04 MED ORDER — ACETAMINOPHEN 500 MG PO TABS
1000.0000 mg | ORAL_TABLET | ORAL | Status: AC
  Administered 2020-04-04: 1000 mg via ORAL
  Filled 2020-04-04: qty 2

## 2020-04-04 MED ORDER — DEXAMETHASONE SODIUM PHOSPHATE 10 MG/ML IJ SOLN
INTRAMUSCULAR | Status: DC | PRN
  Administered 2020-04-04: 10 mg via INTRAVENOUS

## 2020-04-04 MED ORDER — LIDOCAINE HCL (PF) 1 % IJ SOLN
INTRAMUSCULAR | Status: AC
  Filled 2020-04-04: qty 30

## 2020-04-04 MED ORDER — GABAPENTIN 300 MG PO CAPS
300.0000 mg | ORAL_CAPSULE | ORAL | Status: AC
  Administered 2020-04-04: 300 mg via ORAL
  Filled 2020-04-04: qty 1

## 2020-04-04 MED ORDER — SUCCINYLCHOLINE CHLORIDE 200 MG/10ML IV SOSY
PREFILLED_SYRINGE | INTRAVENOUS | Status: DC | PRN
  Administered 2020-04-04: 100 mg via INTRAVENOUS

## 2020-04-04 MED ORDER — ONDANSETRON HCL 4 MG/2ML IJ SOLN
INTRAMUSCULAR | Status: DC | PRN
  Administered 2020-04-04: 4 mg via INTRAVENOUS

## 2020-04-04 MED ORDER — DEXAMETHASONE SODIUM PHOSPHATE 10 MG/ML IJ SOLN
INTRAMUSCULAR | Status: AC
  Filled 2020-04-04: qty 1

## 2020-04-04 MED ORDER — LACTATED RINGERS IV SOLN: INTRAVENOUS | Status: DC

## 2020-04-04 MED ORDER — POTASSIUM CHLORIDE 10 MEQ/100ML IV SOLN
10.0000 meq | INTRAVENOUS | Status: AC
  Administered 2020-04-04 (×2): 10 meq via INTRAVENOUS
  Filled 2020-04-04: qty 200

## 2020-04-04 MED ORDER — NALOXONE HCL 0.4 MG/ML IJ SOLN: 0.4000 mg | INTRAMUSCULAR | Status: DC | PRN

## 2020-04-04 MED ORDER — ALVIMOPAN 12 MG PO CAPS
12.0000 mg | ORAL_CAPSULE | ORAL | Status: AC
  Administered 2020-04-04: 12 mg via ORAL
  Filled 2020-04-04: qty 1

## 2020-04-04 MED ORDER — MIDAZOLAM HCL 2 MG/2ML IJ SOLN
INTRAMUSCULAR | Status: AC
  Filled 2020-04-04: qty 2

## 2020-04-04 MED ORDER — SUFENTANIL CITRATE 50 MCG/ML IV SOLN
INTRAVENOUS | Status: AC
  Filled 2020-04-04: qty 1

## 2020-04-04 MED ORDER — HYDROMORPHONE HCL 1 MG/ML IJ SOLN
0.2500 mg | INTRAMUSCULAR | Status: DC | PRN
  Administered 2020-04-04 (×4): 0.5 mg via INTRAVENOUS

## 2020-04-04 MED ORDER — CHLORHEXIDINE GLUCONATE CLOTH 2 % EX PADS
6.0000 | MEDICATED_PAD | Freq: Every day | CUTANEOUS | Status: DC
  Administered 2020-04-04 – 2020-04-16 (×9): 6 via TOPICAL

## 2020-04-04 MED ORDER — SODIUM CHLORIDE 0.9% FLUSH: 9.0000 mL | INTRAVENOUS | Status: DC | PRN

## 2020-04-04 MED ORDER — ATROPINE SULFATE 1 MG/10ML IJ SOSY
PREFILLED_SYRINGE | INTRAMUSCULAR | Status: AC
  Filled 2020-04-04: qty 10

## 2020-04-04 MED ORDER — SODIUM CHLORIDE (PF) 0.9 % IJ SOLN
INTRAMUSCULAR | Status: AC
  Filled 2020-04-04: qty 10

## 2020-04-04 MED ORDER — PHENYLEPHRINE HCL (PRESSORS) 10 MG/ML IV SOLN
INTRAVENOUS | Status: DC | PRN
Start: 2020-04-04 — End: 2020-04-04
  Administered 2020-04-04: 80 ug via INTRAVENOUS
  Administered 2020-04-04: 60 ug via INTRAVENOUS

## 2020-04-04 MED ORDER — PROPOFOL 10 MG/ML IV BOLUS
INTRAVENOUS | Status: AC
  Filled 2020-04-04: qty 20

## 2020-04-04 MED ORDER — KETAMINE HCL 10 MG/ML IJ SOLN
INTRAMUSCULAR | Status: AC
  Filled 2020-04-04: qty 1

## 2020-04-04 MED ORDER — BACITRACIN-NEOMYCIN-POLYMYXIN OINTMENT TUBE
TOPICAL_OINTMENT | CUTANEOUS | Status: AC
  Filled 2020-04-04: qty 14.17

## 2020-04-04 MED ORDER — ALVIMOPAN 12 MG PO CAPS
12.0000 mg | ORAL_CAPSULE | Freq: Two times a day (BID) | ORAL | Status: DC
  Administered 2020-04-05 – 2020-04-08 (×6): 12 mg via ORAL
  Filled 2020-04-04 (×6): qty 1

## 2020-04-04 MED ORDER — ONDANSETRON HCL 4 MG/2ML IJ SOLN: 4.0000 mg | Freq: Once | INTRAMUSCULAR | Status: DC | PRN

## 2020-04-04 MED ORDER — PROPOFOL 10 MG/ML IV BOLUS
INTRAVENOUS | Status: DC | PRN
  Administered 2020-04-04: 140 mg via INTRAVENOUS

## 2020-04-04 MED ORDER — LIDOCAINE 2% (20 MG/ML) 5 ML SYRINGE
INTRAMUSCULAR | Status: AC
  Filled 2020-04-04: qty 5

## 2020-04-04 MED ORDER — HYDROMORPHONE HCL 1 MG/ML IJ SOLN
INTRAMUSCULAR | Status: DC | PRN
  Administered 2020-04-04 (×4): .5 mg via INTRAVENOUS

## 2020-04-04 MED ORDER — ACETAMINOPHEN 10 MG/ML IV SOLN: 1000.0000 mg | Freq: Once | INTRAVENOUS | Status: DC | PRN

## 2020-04-04 MED ORDER — SODIUM CHLORIDE 0.9 % IV SOLN: INTRAVENOUS | Status: DC

## 2020-04-04 MED ORDER — ONDANSETRON HCL 4 MG/2ML IJ SOLN
INTRAMUSCULAR | Status: AC
  Filled 2020-04-04: qty 2

## 2020-04-04 MED ORDER — SCOPOLAMINE 1 MG/3DAYS TD PT72
1.0000 | MEDICATED_PATCH | TRANSDERMAL | Status: AC
  Administered 2020-04-04: 1.5 mg via TRANSDERMAL
  Filled 2020-04-04: qty 1

## 2020-04-04 MED ORDER — HYDROMORPHONE HCL 1 MG/ML IJ SOLN
INTRAMUSCULAR | Status: AC
  Filled 2020-04-04: qty 2

## 2020-04-04 MED ORDER — OXYCODONE HCL 5 MG PO TABS: 5.0000 mg | ORAL_TABLET | Freq: Once | ORAL | Status: DC | PRN

## 2020-04-04 MED ORDER — 0.9 % SODIUM CHLORIDE (POUR BTL) OPTIME
TOPICAL | Status: DC | PRN
  Administered 2020-04-04 (×2): 2000 mL
  Administered 2020-04-04: 4000 mL

## 2020-04-04 MED ORDER — KETAMINE HCL 10 MG/ML IJ SOLN
INTRAMUSCULAR | Status: DC | PRN
  Administered 2020-04-04: 50 mg via INTRAVENOUS

## 2020-04-04 MED ORDER — DEXMEDETOMIDINE HCL 200 MCG/2ML IV SOLN
INTRAVENOUS | Status: DC | PRN
  Administered 2020-04-04: 12 ug via INTRAVENOUS

## 2020-04-04 MED ORDER — SUGAMMADEX SODIUM 200 MG/2ML IV SOLN
INTRAVENOUS | Status: DC | PRN
  Administered 2020-04-04: 200 mg via INTRAVENOUS

## 2020-04-04 MED ORDER — STERILE WATER FOR IRRIGATION IR SOLN
Status: DC | PRN
  Administered 2020-04-04: 2000 mL

## 2020-04-04 MED ORDER — OXYCODONE HCL 5 MG/5ML PO SOLN: 5.0000 mg | Freq: Once | ORAL | Status: DC | PRN

## 2020-04-04 MED ORDER — HYDROMORPHONE 1 MG/ML IV SOLN
INTRAVENOUS | Status: DC
  Administered 2020-04-04: 30 mg via INTRAVENOUS
  Administered 2020-04-05: 3 mg via INTRAVENOUS
  Administered 2020-04-05: 2.4 mg via INTRAVENOUS
  Administered 2020-04-05: 2.6 mg via INTRAVENOUS
  Administered 2020-04-05: 2.4 mg via INTRAVENOUS
  Administered 2020-04-05: 0.9 mg via INTRAVENOUS
  Administered 2020-04-06: 1.2 mg via INTRAVENOUS
  Administered 2020-04-06: 3.3 mg via INTRAVENOUS
  Administered 2020-04-06: 2.7 mg via INTRAVENOUS

## 2020-04-04 MED ORDER — ROCURONIUM BROMIDE 10 MG/ML (PF) SYRINGE
PREFILLED_SYRINGE | INTRAVENOUS | Status: AC
  Filled 2020-04-04: qty 10

## 2020-04-04 MED ORDER — SUFENTANIL CITRATE 50 MCG/ML IV SOLN
INTRAVENOUS | Status: DC | PRN
  Administered 2020-04-04: 5 ug via INTRAVENOUS
  Administered 2020-04-04: 10 ug via INTRAVENOUS
  Administered 2020-04-04: 5 ug via INTRAVENOUS
  Administered 2020-04-04: 10 ug via INTRAVENOUS
  Administered 2020-04-04 (×2): 5 ug via INTRAVENOUS
  Administered 2020-04-04: 10 ug via INTRAVENOUS

## 2020-04-04 MED ORDER — DROPERIDOL 2.5 MG/ML IJ SOLN: 0.6250 mg | Freq: Once | INTRAMUSCULAR | Status: DC | PRN

## 2020-04-04 MED ORDER — LIDOCAINE 2% (20 MG/ML) 5 ML SYRINGE
INTRAMUSCULAR | Status: DC | PRN
  Administered 2020-04-04: 100 mg via INTRAVENOUS

## 2020-04-04 MED ORDER — PHENYLEPHRINE 40 MCG/ML (10ML) SYRINGE FOR IV PUSH (FOR BLOOD PRESSURE SUPPORT)
PREFILLED_SYRINGE | INTRAVENOUS | Status: AC
  Filled 2020-04-04: qty 10

## 2020-04-04 MED ORDER — FUROSEMIDE 10 MG/ML IJ SOLN
40.0000 mg | Freq: Once | INTRAMUSCULAR | Status: AC
  Administered 2020-04-04: 40 mg via INTRAVENOUS
  Filled 2020-04-04: qty 4

## 2020-04-04 MED ORDER — DIPHENHYDRAMINE HCL 12.5 MG/5ML PO ELIX
12.5000 mg | ORAL_SOLUTION | Freq: Four times a day (QID) | ORAL | Status: DC | PRN
  Filled 2020-04-04: qty 5

## 2020-04-04 MED ORDER — DIPHENHYDRAMINE HCL 50 MG/ML IJ SOLN: 12.5000 mg | Freq: Four times a day (QID) | INTRAMUSCULAR | Status: DC | PRN

## 2020-04-04 MED ORDER — FENTANYL CITRATE (PF) 100 MCG/2ML IJ SOLN
INTRAMUSCULAR | Status: AC
  Filled 2020-04-04: qty 2

## 2020-04-04 MED ORDER — ONDANSETRON HCL 4 MG/2ML IJ SOLN
4.0000 mg | Freq: Four times a day (QID) | INTRAMUSCULAR | Status: DC | PRN
  Administered 2020-04-05 – 2020-04-06 (×3): 4 mg via INTRAVENOUS

## 2020-04-04 MED ORDER — DEXMEDETOMIDINE HCL IN NACL 200 MCG/50ML IV SOLN
INTRAVENOUS | Status: AC
  Filled 2020-04-04: qty 50

## 2020-04-04 MED ORDER — MIDAZOLAM HCL 2 MG/2ML IJ SOLN
INTRAMUSCULAR | Status: DC | PRN
  Administered 2020-04-04: 2 mg via INTRAVENOUS

## 2020-04-04 MED ORDER — HYDROMORPHONE HCL 2 MG/ML IJ SOLN
INTRAMUSCULAR | Status: AC
  Filled 2020-04-04: qty 1

## 2020-04-04 MED ORDER — KETAMINE HCL 10 MG/ML IJ SOLN: INTRAMUSCULAR | Status: DC | PRN

## 2020-04-04 MED ORDER — SODIUM CHLORIDE 0.9 % IV SOLN
INTRAVENOUS | Status: DC | PRN
  Administered 2020-04-04: 10 mL

## 2020-04-04 MED ORDER — BUPIVACAINE-EPINEPHRINE (PF) 0.25% -1:200000 IJ SOLN
INTRAMUSCULAR | Status: AC
  Filled 2020-04-04: qty 30

## 2020-04-04 MED ORDER — KETOROLAC TROMETHAMINE 15 MG/ML IJ SOLN
15.0000 mg | INTRAMUSCULAR | Status: AC
  Administered 2020-04-04: 15 mg via INTRAVENOUS
  Filled 2020-04-04: qty 1

## 2020-04-04 MED ORDER — ROCURONIUM BROMIDE 10 MG/ML (PF) SYRINGE
PREFILLED_SYRINGE | INTRAVENOUS | Status: DC | PRN
  Administered 2020-04-04: 30 mg via INTRAVENOUS
  Administered 2020-04-04: 10 mg via INTRAVENOUS
  Administered 2020-04-04: 50 mg via INTRAVENOUS

## 2020-04-04 MED ORDER — HYDROMORPHONE HCL 1 MG/ML IJ SOLN
0.2500 mg | INTRAMUSCULAR | Status: DC | PRN
  Administered 2020-04-04 (×2): 0.5 mg via INTRAVENOUS

## 2020-04-04 SURGICAL SUPPLY — 117 items
APL PRP STRL LF DISP 70% ISPRP (MISCELLANEOUS) ×2
APL SWBSTK 6 STRL LF DISP (MISCELLANEOUS) ×4
APPLICATOR COTTON TIP 6 STRL (MISCELLANEOUS) ×4 IMPLANT
APPLICATOR COTTON TIP 6IN STRL (MISCELLANEOUS) ×6
APPLIER CLIP 5 13 M/L LIGAMAX5 (MISCELLANEOUS)
APPLIER CLIP ROT 10 11.4 M/L (STAPLE)
APR CLP MED LRG 11.4X10 (STAPLE)
APR CLP MED LRG 5 ANG JAW (MISCELLANEOUS)
BAG URO CATCHER STRL LF (MISCELLANEOUS) ×3 IMPLANT
BASKET ZERO TIP NITINOL 2.4FR (BASKET) IMPLANT
BLADE EXTENDED COATED 6.5IN (ELECTRODE) ×1 IMPLANT
BLADE HEX COATED 2.75 (ELECTRODE) ×3 IMPLANT
BLADE SURG SZ10 CARB STEEL (BLADE) IMPLANT
BSKT STON RTRVL ZERO TP 2.4FR (BASKET)
CABLE HIGH FREQUENCY MONO STRZ (ELECTRODE) ×3 IMPLANT
CANISTER WOUNDNEG PRESSURE 500 (CANNISTER) ×1 IMPLANT
CATH URET 5FR 28IN OPEN ENDED (CATHETERS) ×3 IMPLANT
CELLS DAT CNTRL 66122 CELL SVR (MISCELLANEOUS) IMPLANT
CHLORAPREP W/TINT 26 (MISCELLANEOUS) ×3 IMPLANT
CLIP APPLIE 5 13 M/L LIGAMAX5 (MISCELLANEOUS) IMPLANT
CLIP APPLIE ROT 10 11.4 M/L (STAPLE) IMPLANT
CLOTH BEACON ORANGE TIMEOUT ST (SAFETY) ×3 IMPLANT
CNTNR URN SCR LID CUP LEK RST (MISCELLANEOUS) IMPLANT
CONT SPEC 4OZ STRL OR WHT (MISCELLANEOUS) ×3
COUNTER NEEDLE 20 DBL MAG RED (NEEDLE) ×3 IMPLANT
COVER MAYO STAND STRL (DRAPES) ×9 IMPLANT
COVER SURGICAL LIGHT HANDLE (MISCELLANEOUS) ×3 IMPLANT
COVER WAND RF STERILE (DRAPES) IMPLANT
DECANTER SPIKE VIAL GLASS SM (MISCELLANEOUS) ×3 IMPLANT
DRAIN CHANNEL 19F RND (DRAIN) IMPLANT
DRAPE LAPAROSCOPIC ABDOMINAL (DRAPES) ×3 IMPLANT
DRAPE SHEET LG 3/4 BI-LAMINATE (DRAPES) IMPLANT
DRAPE WARM FLUID 44X44 (DRAPES) ×3 IMPLANT
DRSG OPSITE POSTOP 4X10 (GAUZE/BANDAGES/DRESSINGS) IMPLANT
DRSG OPSITE POSTOP 4X6 (GAUZE/BANDAGES/DRESSINGS) IMPLANT
DRSG OPSITE POSTOP 4X8 (GAUZE/BANDAGES/DRESSINGS) IMPLANT
DRSG PAD ABDOMINAL 8X10 ST (GAUZE/BANDAGES/DRESSINGS) IMPLANT
DRSG VAC ATS MED SENSATRAC (GAUZE/BANDAGES/DRESSINGS) ×1 IMPLANT
ELECT REM PT RETURN 15FT ADLT (MISCELLANEOUS) ×3 IMPLANT
ENDOLOOP SUT PDS II  0 18 (SUTURE)
ENDOLOOP SUT PDS II 0 18 (SUTURE) IMPLANT
EVACUATOR DRAINAGE 10X20 100CC (DRAIN) IMPLANT
EVACUATOR SILICONE 100CC (DRAIN) ×1 IMPLANT
EXTRACTOR STONE 1.7FRX115CM (UROLOGICAL SUPPLIES) IMPLANT
GAUZE SPONGE 4X4 12PLY STRL (GAUZE/BANDAGES/DRESSINGS) ×3 IMPLANT
GLOVE BIO SURGEON STRL SZ 6 (GLOVE) ×9 IMPLANT
GLOVE BIOGEL M STRL SZ7.5 (GLOVE) ×3 IMPLANT
GLOVE BIOGEL PI IND STRL 6.5 (GLOVE) ×2 IMPLANT
GLOVE BIOGEL PI IND STRL 7.0 (GLOVE) ×2 IMPLANT
GLOVE BIOGEL PI INDICATOR 6.5 (GLOVE) ×1
GLOVE BIOGEL PI INDICATOR 7.0 (GLOVE) ×1
GLOVE INDICATOR 6.5 STRL GRN (GLOVE) ×6 IMPLANT
GOWN STRL REUS W/ TWL XL LVL3 (GOWN DISPOSABLE) ×6 IMPLANT
GOWN STRL REUS W/TWL 2XL LVL3 (GOWN DISPOSABLE) ×3 IMPLANT
GOWN STRL REUS W/TWL XL LVL3 (GOWN DISPOSABLE) ×18 IMPLANT
GUIDEWIRE ANG ZIPWIRE 038X150 (WIRE) IMPLANT
GUIDEWIRE STR DUAL SENSOR (WIRE) ×3 IMPLANT
HANDLE SUCTION POOLE (INSTRUMENTS) ×2 IMPLANT
KIT BASIN (CUSTOM PROCEDURE TRAY) ×3 IMPLANT
KIT TURNOVER KIT A (KITS) IMPLANT
LEGGING LITHOTOMY PAIR STRL (DRAPES) ×1 IMPLANT
LIGASURE IMPACT 36 18CM CVD LR (INSTRUMENTS) ×1 IMPLANT
MANIFOLD NEPTUNE II (INSTRUMENTS) ×3 IMPLANT
NS IRRIG 1000ML POUR BTL (IV SOLUTION) ×6 IMPLANT
PACK COLON (CUSTOM PROCEDURE TRAY) ×3 IMPLANT
PACK CYSTO (CUSTOM PROCEDURE TRAY) ×3 IMPLANT
PACK GENERAL/GYN (CUSTOM PROCEDURE TRAY) ×3 IMPLANT
PAD POSITIONING PINK XL (MISCELLANEOUS) IMPLANT
PENCIL SMOKE EVACUATOR (MISCELLANEOUS) IMPLANT
PORT LAP GEL ALEXIS MED 5-9CM (MISCELLANEOUS) IMPLANT
RETRACTOR WND ALEXIS 18 MED (MISCELLANEOUS) IMPLANT
RTRCTR WOUND ALEXIS 18CM MED (MISCELLANEOUS)
SCISSORS LAP 5X35 DISP (ENDOMECHANICALS) ×3 IMPLANT
SEALER TISSUE G2 STRG ARTC 35C (ENDOMECHANICALS) ×3 IMPLANT
SET IRRIG TUBING LAPAROSCOPIC (IRRIGATION / IRRIGATOR) ×3 IMPLANT
SET TUBE SMOKE EVAC HIGH FLOW (TUBING) ×3 IMPLANT
SHEATH URETERAL 12FRX28CM (UROLOGICAL SUPPLIES) IMPLANT
SHEATH URETERAL 12FRX35CM (MISCELLANEOUS) IMPLANT
SLEEVE SUCTION 125 (MISCELLANEOUS) ×1 IMPLANT
SLEEVE SURGEON STRL (DRAPES) IMPLANT
SLEEVE XCEL OPT CAN 5 100 (ENDOMECHANICALS) ×6 IMPLANT
SPONGE LAP 18X18 RF (DISPOSABLE) IMPLANT
STAPLER CUT CVD 40MM GREEN (STAPLE) ×1 IMPLANT
STAPLER PROXIMATE 75MM BLUE (STAPLE) ×1 IMPLANT
STAPLER VISISTAT 35W (STAPLE) ×3 IMPLANT
SUCTION POOLE HANDLE (INSTRUMENTS) ×3
SURGILUBE 2OZ TUBE FLIPTOP (MISCELLANEOUS) IMPLANT
SUT ETHILON 2 0 PS N (SUTURE) IMPLANT
SUT MNCRL AB 4-0 PS2 18 (SUTURE) ×3 IMPLANT
SUT PDS AB 1 CTX 36 (SUTURE) ×6 IMPLANT
SUT PDS AB 1 TP1 96 (SUTURE) ×2 IMPLANT
SUT PROLENE 2 0 KS (SUTURE) ×3 IMPLANT
SUT PROLENE 2 0 SH DA (SUTURE) ×1 IMPLANT
SUT SILK 2 0 (SUTURE) ×3
SUT SILK 2 0 SH CR/8 (SUTURE) ×1 IMPLANT
SUT SILK 2-0 18XBRD TIE 12 (SUTURE) IMPLANT
SUT SILK 3 0 (SUTURE) ×3
SUT SILK 3 0 SH CR/8 (SUTURE) ×1 IMPLANT
SUT SILK 3-0 18XBRD TIE 12 (SUTURE) IMPLANT
SUT VIC AB 2-0 SH 18 (SUTURE) ×3 IMPLANT
SUT VIC AB 3-0 SH 18 (SUTURE) ×5 IMPLANT
SUT VIC AB 3-0 SH 8-18 (SUTURE) ×1 IMPLANT
SUT VICRYL 2 0 18  UND BR (SUTURE) ×3
SUT VICRYL 2 0 18 UND BR (SUTURE) ×2 IMPLANT
SUT VICRYL 3 0 BR 18  UND (SUTURE) ×3
SUT VICRYL 3 0 BR 18 UND (SUTURE) ×2 IMPLANT
SYS LAPSCP GELPORT 120MM (MISCELLANEOUS)
SYSTEM LAPSCP GELPORT 120MM (MISCELLANEOUS) IMPLANT
TOWEL OR 17X26 10 PK STRL BLUE (TOWEL DISPOSABLE) ×6 IMPLANT
TOWEL OR NON WOVEN STRL DISP B (DISPOSABLE) ×3 IMPLANT
TRAY FOLEY MTR SLVR 16FR STAT (SET/KITS/TRAYS/PACK) ×3 IMPLANT
TROCAR BLADELESS OPT 5 100 (ENDOMECHANICALS) ×3 IMPLANT
TROCAR XCEL BLUNT TIP 100MML (ENDOMECHANICALS) IMPLANT
TROCAR XCEL NON-BLD 11X100MML (ENDOMECHANICALS) IMPLANT
TUBING CONNECTING 10 (TUBING) ×3 IMPLANT
TUBING UROLOGY SET (TUBING) ×3 IMPLANT
YANKAUER SUCT BULB TIP NO VENT (SUCTIONS) ×3 IMPLANT

## 2020-04-04 NOTE — Progress Notes (Addendum)
Day of Surgery    AL:[id number] pain  Subjective: Pt anxious and complaining of SOB lying down, no chest pain complaints till I ask and then she said she had some.  I ask about leg pain and she said the SCD's made her feel like she had been riding a bike. No tenderness on my exam.   Still having abdominal pain and waiting for pain medicine also.  Objective: Vital signs in last 24 hours: Temp:  [98 F (36.7 C)-98.1 F (36.7 C)] 98 F (36.7 C) (07/07 0644) Pulse Rate:  [88-104] 88 (07/07 0644) Resp:  [17-19] 19 (07/07 0644) BP: (121-139)/(74-82) 125/79 (07/07 0644) SpO2:  [90 %-100 %] 100 % (07/07 0644) Last BM Date: 04/03/20 NPO 3836 IV Urine x 3 No  Bm recorded Afebrile, VSS K+ 3.2, CMP stable INR 1.4/PTT39 Type and screen is in  Intake/Output from previous day: 07/06 0701 - 07/07 0700 In: 3836.6 [I.V.:2761.7; IV Piggyback:1074.8] Out: 0  Intake/Output this shift: No intake/output data recorded.  General appearance: alert, cooperative and anxious and complains of significant chest pain with lying down. Resp: clear to auscultation bilaterally Cardio: regular rate and rhythm, S1, S2 normal, no murmur, click, rub or gallop GI: tender, and complains of ongoing pain Extremities: extremities normal, atraumatic, no cyanosis or edema  Lab Results:  Recent Labs    04/03/20 0430 04/04/20 0445  WBC 10.8* 9.8  HGB 10.0* 9.8*  HCT 32.7* 31.6*  PLT 174 172    BMET Recent Labs    04/03/20 0430 04/04/20 0445  NA 137 139  K 3.7 3.2*  CL 103 105  CO2 26 23  GLUCOSE 95 76  BUN 10 10  CREATININE 0.52 0.60  CALCIUM 7.9* 7.9*   PT/INR Recent Labs    04/04/20 0445  LABPROT 17.0*  INR 1.4*    Recent Labs  Lab 03/31/20 0910 04/01/20 0514 04/02/20 0605 04/03/20 0430 04/04/20 0445  AST 21 16 14* 12* 10*  ALT 17 16 13 10 8   ALKPHOS 68 43 45 50 53  BILITOT 1.2 0.9 0.5 0.8 0.9  PROT 7.9 5.7* 5.3* 5.0* 5.3*  ALBUMIN 3.9 2.6* 2.4* 2.2* 2.2*     Lipase      Component Value Date/Time   LIPASE 18 03/31/2020 0910     Medications: . alvimopan  12 mg Oral On Call to OR    Assessment/Plan Complaining of chest pain and shortness of breath this AM - LLL collapse/consolidation per CXR  - ? Fluid overload Wt up 10 Kg Intractable nausea/vomiting/pain Chronic blood loss anemia Protein calorie malnutrition  Enlarging pelvic mass/pelvic abscess  - Hospitalized 6/13-6/18/2021-localized/contained sigmoid colon perforation in the setting of        phlegmon and extensive colonic inflammation; 2 cm right hepatic lobe mass             Mechanical obstruction CEA 4.9 >>5.6  - CT 03/31/2020: Previously noted sigmoid mass increased in size and again appears to be associated with a focally contained perforation which crosses the midline and is fistulized into the left adnexal region where there is now what appears to be a left tubo-ovarian abscess; borderline enlarged retroperitoneal lymphadenopathy; cyst suspected metastatic lesion in segment 7 of the liver has enlarged.  FEN: IV fluids/n.p.o. ID: Cefepime/Flagyl 7/3 >> day 4 DVT: SCDs Follow-up: TBD  Plan:  I have ask Dr. Grandville Silos to come up and see her.  Will discuss with him and Dr. Barry Dienes.     LOS: 4 days  Tylisha Danis 04/04/2020 Please see Amion

## 2020-04-04 NOTE — Anesthesia Postprocedure Evaluation (Signed)
Anesthesia Post Note  Patient: Stephanie [name]  Procedure(s) Performed: EXPLORATORY LAPAROTOMY (N/A ) CYSTOSCOPY WITH STENT PLACEMENT ([place])     Patient location during evaluation: PACU Anesthesia Type: General Level of consciousness: awake and alert Pain management: pain level controlled Vital Signs Assessment: post-procedure vital signs reviewed and stable Respiratory status: spontaneous breathing, nonlabored ventilation, respiratory function stable and patient connected to nasal cannula oxygen Cardiovascular status: blood pressure returned to baseline and stable Postop Assessment: no apparent nausea or vomiting Anesthetic complications: no   No complications documented.  Last Vitals:  Vitals:   04/04/20 1545 04/04/20 1605  BP: 114/69 131/80  Pulse: 85 80  Resp: 12 16  Temp: 36.4 C 36.4 C  SpO2: 96% 98%    Last Pain:  Vitals:   04/04/20 1605  TempSrc: Oral  PainSc:                  Barnet [place]

## 2020-04-04 NOTE — Progress Notes (Addendum)
PROGRESS NOTE    Stephanie [name]  PTW:[id number] DOB: August 21, 1981 DOA: 03/31/2020 PCP: Patient, [name]    Chief Complaint  Patient presents with  . Abdominal Pain    Brief Narrative:  Stephanie [name] a 39 y.o.femalewith medical history significant ofrecent admission from June 14 to June 18 with abdominal pain and abscess with a question of sigmoidcolon malignancy versus diverticulitis. At that time she had an abnormal CT scan which also showed a liver mass. She had a barely elevated CEA at 4.9. She was placed on IV antibiotics, an attempt was made at liver biopsy however ultrasound suggested it might be a hemangioma. An MRI was equivocal. She was sent home on Augmentin which she completed and was doing well with until the last few days. She has continued to have blood in her stool. Stools are soft and not fully formed. She is passing gas. She has had increasing nausea with emesis over the last 12 hours. She reports ongoing lower abdominal cramping that is worse in the left lower quadrant. Her pain was 10 out of 10 requiring IV Dilaudid in the ED. She has an IUD in place, no sexual partner for the last 1 year. She denies vaginal discharge, vaginal bleeding, dysuria, hematuria, chest pain, or shortness of breath. In the ED she was sent for CT of the abdomen and pelvis which showed enlarging sigmoid colonic mass, which appeared which appears to be associated with focal perforation and has fistulized into the left adnexal region involving the left TOA. There are multiple enlarged lymph nodes which are bigger than before measuring up to 1.2 cm. The previously suspected metastatic lesion in the liver is also enlarged, small volume ascites. Patient placed empirically on IV antibiotics.  GI and general surgery were consulted.  Patient for exploratory laparotomy and partial colectomy with urethral stent placement today 04/04/2020 per general surgery. Patient with some complaints  of shortness of breath the morning of 04/04/2020, concern for volume overload and as such chest x-ray obtained and patient given a dose of IV Lasix, IV fluids discontinued.   Assessment & Plan:   Principal Problem:   Sepsis ([place]) Active Problems:   Peritonitis with abscess of intestine (HCC)   Colorectal carcinoma (HCC)   Microcytic anemia   Intra-abdominal abscess (HCC)   SOB (shortness of breath)   Moderate protein-calorie malnutrition (HCC)   Nausea & vomiting  #1 sepsis secondary to likely intra-abdominal abscess, enlarging pelvic mass/pelvic abscess with fistulization into the left adnexa, POA Patient had presented with worsening abdominal pain, CT abdomen and pelvis done concerning for an enlarging which appeared which appears to be associated with focal perforation and has fistulized into the left adnexal region involving the left TOA. There are multiple enlarged lymph nodes which are bigger than before measuring up to 1.2 cm. The previously suspected metastatic lesion in the liver is also enlarged, small volume ascites. Due to the location of the lesion percutaneous approach was not possible when Dr. Avon Gully discussed with IR or GI. General surgery consulted and had recommended IV antibiotics which patient was started on and patient currently on cefepime plus IV Flagyl for intra-abdominal abscess with presumed malignancy.  CEA at 5.6.  Continue current pain medication of IV Dilaudid which patient is taking 2 mg every 2 hours as needed for pain.  Patient to the OR today for exploratory laparotomy and possible partial colectomy with urethral stent placement per general surgery.  2.??  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  Patient being followed by general surgery who feel patient will likely need a colostomy if surgery is to be done.  Continue IV antibiotics.   Patient likely to the OR this morning for exploratory laparotomy and partial colectomy with ureteral stent placement per general surgery.  3.  Shortness of breath Patient with complaints of shortness of breath when laying in the supine position feels likely secondary to abdominal swelling and swelling all over.  Patient denies any chest pain.  Patient noted to be 12 L positive from Is/Os  however urine output not properly recorded.  Patient's weight today is 88.1 kg from 77.1 kg on admission.  Stat chest x-ray.  Saline lock IV fluids.  Strict I's and O's.  Daily weights.  Lasix 40 mg IV x1.  4.  Nausea/vomiting/abdominal pain.   Nausea and vomiting have improved since admission.  Patient however with ongoing abdominal pain requiring increased use of IV Dilaudid.  See #1, #2.  Supportive care.  5.  Acute blood loss anemia/chronic iron deficiency anemia Patient noted to have some scant bright red blood per rectum noted on previous exam is with recent bowel movement.  Hemoglobin currently stable at 9.8.  Follow H&H.  Transfusion threshold hemoglobin < 7.  Patient seen by GI who feel findings worrisome for colon cancer, colonoscopy currently high risk in the setting of adnexal abscess involving the small bowel and recommending consideration for colonoscopy in 6 to 8 weeks for tissue diagnosis if patient does not require surgery prior to that time.  Outpatient follow-up with GI.  6.  Moderate protein calorie malnutrition In the setting of above.  Patient with intractable nausea vomiting and abdominal pain.  Currently n.p.o. pending to the OR today.  Follow.  7.  Hypokalemia Likely secondary to GI losses.  Replete.  DVT prophylaxis: SCDs Code Status: Full Family Communication: Updated patient.  No family at bedside. Disposition:   Status is: Inpatient    Dispo: The patient is from: Home              Anticipated d/c is to: Home              Anticipated d/c date is:      To be determined.           Patient currently with significant abdominal pain, going to the OR for further evaluation of concern for colonic mass and tubo-ovarian abscess, on empiric IV antibiotics.  Not stable for discharge.       Consultants:   General surgery: Dr. Leighton Ruff 02/03/8501  Gastroenterology: Dr. Tarri Glenn 04/01/2020  Wound care nurse Julien Girt, RN 04/04/2020  Procedures:   CT abdomen and pelvis 03/31/2020    Antimicrobials:   IV cefepime 03/31/2020>>>>  Preop IV Cefotan 04/05/2019 21x1 dose  IV Flagyl 03/31/2020  IV Zosyn 04/01/2019 21x1 dose   Subjective: Patient laying in bed.  States no bowel movement since admission.  Complain of diffuse abdominal pain and back pain.  Feeling swollen and feels it is moving everywhere causing her to be short of breath when she lays flat.  Somewhat anxious this morning.  Hopeful she is going to have surgery today and does not want to be delayed.  Complaining of shortness of breath when laying flat.  Denies any chest pain.  Objective: Vitals:   04/04/20 0157 04/04/20 0644 04/04/20 0908 04/04/20 0909  BP: 121/74 125/79  Pulse: 89 88 88 (!) 106  Resp: 18 19    Temp: 98.1 F (36.7 C) 98 F (36.7 C)    TempSrc: Oral Oral    SpO2: 100% 100% 100% 98%  Weight:      Height:        Intake/Output Summary (Last 24 hours) at 04/04/2020 0954 Last data filed at 04/04/2020 0918 Gross per 24 hour  Intake 3836.56 ml  Output 0 ml  Net 3836.56 ml   Filed Weights   03/31/20 0850  Weight: 77.1 kg    Examination:  General exam: Cheerful. Respiratory system: Decreased breath sounds in the bases.  No wheezing.  No crackles noted.  Fair air movement.  Speaking in full sentences.  Normal respiratory effort.   Cardiovascular system: S1 & S2 heard, RRR. No JVD, murmurs, rubs, gallops or clicks.  Trace bilateral lower extremity edema. Gastrointestinal system: Abdomen is soft, hypoactive bowel sounds, nondistended, tender to palpation diffusely.  No rebound.  No  guarding.  Central nervous system: Alert and oriented. No focal neurological deficits. Extremities: Symmetric 5 x 5 power. Skin: No rashes, lesions or ulcers Psychiatry: Judgement and insight appear normal. Mood & affect appropriate.     Data Reviewed: I have personally reviewed following labs and imaging studies  CBC: Recent Labs  Lab 03/31/20 0910 04/01/20 0514 04/02/20 0605 04/03/20 0430 04/04/20 0445  WBC 23.2* 7.4 9.9 10.8* 9.8  HGB 12.6 11.6* 10.3* 10.0* 9.8*  HCT 41.0 36.8 33.6* 32.7* 31.6*  MCV 82.0 81.1 82.6 82.8 82.9  PLT 225 162 162 174 850    Basic Metabolic Panel: Recent Labs  Lab 03/31/20 0910 04/01/20 0514 04/02/20 0605 04/03/20 0430 04/04/20 0445  NA 134* 137 137 137 139  K 3.7 4.0 3.9 3.7 3.2*  CL 101 105 105 103 105  CO2 20* 23 25 26 23   GLUCOSE 114* 95 92 95 76  BUN 9 13 12 10 10   CREATININE 0.90 0.68 0.53 0.52 0.60  CALCIUM 8.8* 8.0* 8.2* 7.9* 7.9*  MG  --   --   --   --  1.9    GFR: Estimated Creatinine Clearance: 98.7 mL/min (by C-G formula based on SCr of 0.6 mg/dL).  Liver Function Tests: Recent Labs  Lab 03/31/20 0910 04/01/20 0514 04/02/20 0605 04/03/20 0430 04/04/20 0445  AST 21 16 14* 12* 10*  ALT 17 16 13 10 8   ALKPHOS 68 43 45 50 53  BILITOT 1.2 0.9 0.5 0.8 0.9  PROT 7.9 5.7* 5.3* 5.0* 5.3*  ALBUMIN 3.9 2.6* 2.4* 2.2* 2.2*    CBG: No results for input(s): GLUCAP in the last 168 hours.   Recent Results (from the past 240 hour(s))  Blood culture (routine x 2)     Status: None (Preliminary result)   Collection Time: 03/31/20 12:52 PM   Specimen: BLOOD LEFT FOREARM  Result Value Ref Range Status   Specimen Description   Final    BLOOD LEFT FOREARM Performed at [facility], [place] [address], [place], [place] [address]    Special Requests   Final    BOTTLES DRAWN AEROBIC AND ANAEROBIC Blood Culture results may not be optimal due to an inadequate volume of blood received in culture bottles Performed at [place] [address] [place], [place] [address]    Culture   Final    NO GROWTH 3 DAYS Performed at [facility], [place] [address] [place], [place] [address]    Report  Status PENDING  Incomplete  Blood culture (routine x 2)     Status: None (Preliminary result)   Collection Time: 03/31/20 12:53 PM   Specimen: BLOOD  Result Value Ref Range Status   Specimen Description   Final    BLOOD LEFT WRIST Performed at [place] [address] [place], [place] [address]    Special Requests   Final    BOTTLES DRAWN AEROBIC AND ANAEROBIC Blood Culture adequate volume Performed at [facility] [address], [place], [place] [address]    Culture   Final    NO GROWTH 3 DAYS Performed at [facility], [place] [address] [place], [place] [address]    Report Status PENDING  Incomplete  SARS Coronavirus 2 by RT PCR (hospital order, performed in [facility] hospital lab) Nasopharyngeal Nasopharyngeal Swab     Status: None   Collection Time: 03/31/20 12:53 PM   Specimen: Nasopharyngeal Swab  Result Value Ref Range Status   SARS Coronavirus 2 NEGATIVE NEGATIVE Final    Comment: (NOTE) SARS-CoV-2 target nucleic acids are NOT DETECTED.  The SARS-CoV-2 RNA is generally detectable in upper and lower respiratory specimens during the acute phase of infection. The lowest concentration of SARS-CoV-2 viral copies this assay can detect is 250 copies / mL. A negative result does not preclude SARS-CoV-2 infection and should not be used as the sole basis for treatment or other patient management decisions.  A negative result may occur with improper specimen collection / handling, submission of specimen other than nasopharyngeal swab, presence of viral mutation(s) within the areas targeted by this assay, and inadequate number of viral copies (<250 copies / mL). A negative result must be combined with clinical observations, patient  history, and epidemiological information.  Fact Sheet for Patients:   StrictlyIdeas.no  Fact Sheet for Healthcare Providers: BankingDealers.co.za  This test is not yet approved or  cleared by the [place] FDA and has been authorized for detection and/or diagnosis of SARS-CoV-2 by FDA under an Emergency Use Authorization (EUA).  This EUA will remain in effect (meaning this test can be used) for the duration of the COVID-19 declaration under Section 564(b)(1) of the Act, 21 U.S.C. section 360bbb-3(b)(1), unless the authorization is terminated or revoked sooner.  Performed at [facility], [place] [address] [place], [place] [address]   Surgical PCR screen     Status: None   Collection Time: 04/03/20  4:52 PM   Specimen: Nasal Mucosa; Nasal Swab  Result Value Ref Range Status   MRSA, PCR NEGATIVE NEGATIVE Final   Staphylococcus aureus NEGATIVE NEGATIVE Final    Comment: (NOTE) The Xpert SA Assay (FDA approved for NASAL specimens in patients 28 years of age and older), is one component of a comprehensive surveillance program. It is not intended to diagnose infection nor to guide or monitor treatment. Performed at [facility], [place] [address] [place], [place] [address]          Radiology Studies: No results found.      Scheduled Meds: Continuous Infusions: . ceFEPime (MAXIPIME) IV Stopped (04/04/20 0557)  . cefoTEtan (CEFOTAN) IV    . famotidine (PEPCID) IV 100 mL/hr at 04/03/20 2200  . metronidazole Stopped (04/04/20 0525)  . potassium chloride       LOS: 4 days    Time spent: 40 minutes    Irine Seal, MD [facility]   To contact the attending provider between 7A-7P or the covering  provider during after hours 7P-7A, please log into the web site [email or web address] and access using universal [place] password for that web site. If you do not have the password,  please call the hospital operator.  04/04/2020, 9:54 AM

## 2020-04-04 NOTE — Anesthesia Preprocedure Evaluation (Addendum)
Anesthesia Evaluation  Patient identified by MRN, date of birth, ID band Patient awake    Reviewed: Allergy & Precautions, NPO status , Patient's Chart, lab work & pertinent test results  Airway Mallampati: II  TM Distance: >3 FB Neck ROM: Full    Dental no notable dental hx. (+) Teeth Intact, Dental Advisory Given   Pulmonary shortness of breath, former smoker,    Pulmonary exam normal breath sounds clear to auscultation       Cardiovascular Exercise Tolerance: Good negative cardio ROS Normal cardiovascular exam Rhythm:Regular Rate:Normal     Neuro/Psych Anxiety negative neurological ROS     GI/Hepatic negative GI ROS, Neg liver ROS, Probable colon ca   Endo/Other  negative endocrine ROS  Renal/GU Lab Results      Component                Value               Date                      CREATININE               0.60                04/04/2020                BUN                      10                  04/04/2020                NA                       139                 04/04/2020                K                        3.2 (L)             04/04/2020                CL                       105                 04/04/2020                CO2                      23                  04/04/2020                Musculoskeletal negative musculoskeletal ROS (+)   Abdominal   Peds  Hematology  (+) anemia , Lab Results      Component                Value               Date                      WBC  9.8                 04/04/2020                HGB                      9.8 (L)             04/04/2020                HCT                      31.6 (L)            04/04/2020                MCV                      82.9                04/04/2020                PLT                      172                 04/04/2020              Anesthesia Other Findings   Reproductive/Obstetrics negative OB ROS                             Anesthesia Physical Anesthesia Plan  ASA: III  Anesthesia Plan: General   Post-op Pain Management:    Induction: Intravenous  PONV Risk Score and Plan: 4 or greater and Treatment may vary due to age or medical condition, Ondansetron, Dexamethasone, Scopolamine patch - Pre-op and Midazolam  Airway Management Planned: Oral ETT  Additional Equipment: None  Intra-op Plan:   Post-operative Plan: Possible Post-op intubation/ventilation  Informed Consent: I have reviewed the patients History and Physical, chart, labs and discussed the procedure including the risks, benefits and alternatives for the proposed anesthesia with the patient or authorized representative who has indicated his/her understanding and acceptance.     Dental advisory given  Plan Discussed with: CRNA  Anesthesia Plan Comments:         Anesthesia Quick Evaluation

## 2020-04-04 NOTE — Transfer of Care (Signed)
Immediate Anesthesia Transfer of Care Note  Patient: Stephanie [name]  Procedure(s) Performed: EXPLORATORY LAPAROTOMY (N/A ) CYSTOSCOPY WITH STENT PLACEMENT ([place])  Patient Location: PACU  Anesthesia Type:General  Level of Consciousness: drowsy  Airway & Oxygen Therapy: Patient Spontanous Breathing and Patient connected to face mask oxygen  Post-op Assessment: Report given to RN and Post -op Vital signs reviewed and stable  Post vital signs: Reviewed and stable  Last Vitals:  Vitals Value Taken Time  BP 134/66 04/04/20 1431  Temp    Pulse 93 04/04/20 1433  Resp 14 04/04/20 1433  SpO2 100 % 04/04/20 1433  Vitals shown include unvalidated device data.  Last Pain:  Vitals:   04/04/20 1018  TempSrc: Oral  PainSc:       Patients Stated Pain Goal: 3 (82/95/62 1308)  Complications: No complications documented.

## 2020-04-04 NOTE — Progress Notes (Signed)
Called patient's mother to inform her she just left to go to surgery. Message left on her phone.

## 2020-04-04 NOTE — Op Note (Signed)
Preoperative diagnosis:  1. Pelvic Abscess   Postoperative diagnosis:  1. Same   Procedure: 1. Cystoscopy, retrograde pyelogram with interpretation 2. Bilateral temporary ureteral stent placement   Surgeon: Ardis Hughs, MD   Anesthesia: General   Complications: None   Intraoperative findings: normal urinary tract  #1)  Left retrograde pyelogram was performed using 10cc of Omnipaque contrast using a 5 [place] open-ended ureteral catheter demonstrating a normal caliber ureter with no significant hydroureteronephrosis or filling defects. #2: Right retrograde pyelogram was performed using  10cc  Omnipaque contrast using a 5 [place] open-ended ureteral catheter demonstrating a normal caliber ureter with no significant filling defect or hydroureteronephrosis.  EBL: Minimal   Specimens: None   Indication:@ is a 39 y.o.  patient with large diverticular abscess vs. Colon cancer.  Dr. Barry Dienes requested temporary ureteral stents to help facilitate the dissection of the sigmoid colon.  After reviewing the management options for treatment, he elected to proceed with the above surgical procedure(s). We have discussed the potential benefits and risks of the procedure, side effects of the proposed treatment, the likelihood of the patient achieving the goals of the procedure, and any potential problems that might occur during the procedure or recuperation. Informed consent has been obtained.   Description of procedure:   The patient was taken to the operating room and general anesthesia was induced.  The patient was placed in the dorsal lithotomy position, prepped and draped in the usual sterile fashion, and preoperative antibiotics were administered. A preoperative time-out was performed.    A 21 French 30 degree cystoscope was gently passed through the patient's urethra into the bladder.  The bladder was subsequently emptied and then filled slowly up performing a 360 degrees cystoscopic  evaluation.  This demonstrated orthotopic ureteral orifices, normal bladder mucosa with no evidence of colovesical fistula without mucosal abnormality.   I then advanced a 5 [place] open-ended ureteral catheter into the patient's left ureteral orifice and performed retrograde pyelogram with the above findings.  I then advanced the catheter up to the renal pelvis.  Subsequently turned my attention to the patient's right ureteral orifice and using a second open-ended catheter advanced it into the right ureteral orifice performing a retrograde pyelogram with the above findings.  I then advanced the stent up to the renal pelvis under fluoroscopic guidance.  The bladder was subsequently emptied and the stents were kept in place removing the scope over the stents.  I then  placed a 16 [place] Foley with a Freight forwarder.  The stents were then attached to the [facility] adapter and then tied to the Foley with a silk tie.    The surgery was then turned over to Dr. Barry Dienes for facilitation of the remainder of the case.

## 2020-04-04 NOTE — Anesthesia Procedure Notes (Signed)
Procedure Name: Intubation Date/Time: 04/04/2020 11:39 AM Performed by: Sharlette Dense, CRNA Patient Re-evaluated:Patient Re-evaluated prior to induction Oxygen Delivery Method: Circle system utilized Preoxygenation: Pre-oxygenation with 100% oxygen Induction Type: IV induction, Rapid sequence and Cricoid Pressure applied Laryngoscope Size: Miller and 2 Grade View: Grade I Tube size: 7.5 mm Number of attempts: 1 Airway Equipment and Method: Stylet Placement Confirmation: ETT inserted through vocal cords under direct vision,  positive ETCO2 and breath sounds checked- equal and bilateral Secured at: 21 cm Tube secured with: Tape Dental Injury: Teeth and Oropharynx as per pre-operative assessment

## 2020-04-04 NOTE — Op Note (Signed)
  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   FINDINGS: infected peritonitis with pus throughout lower abdomen.     ESTIMATED BLOOD LOSS: 100 mL.   COMPLICATIONS: None known.   PROCEDURE:  Patient was identified in the holding area and taken to  the operating room where she was placed supine on the operating room  table. General anesthesia was induced. Dr. Louis Meckel placed bilateral ureteral stents initially given the level of inflammation in the pelvis.  Once he was done, we continued to the abdominal portion.  The abdomen was prepped and  draped in a sterile fashion. Time-out was performed according to the  surgical safety check list. When all was correct we continued.   The midline was incised incorporating the port site, and the subcutaneous tissues were divided with a Bovie electrocautery. The fascia was opened in the midline. Purulent fluid was aspirated from the abdomen and sent for microbiology. The sigmoid colon was adherent to the bladder and retroperitoneum, but gentle finger fracture was used to separate this off.  There was a very firm area of the colon that was felt to likely represent cancer.  The small bowel was packed away from the pelvis and held in place with the bookwalter retractor.  The proximal sigmoid was identified and was not inflamed.  This was then divided with the GIA 75 mm stapler.    The white line of Toldt of the descending colon was taken down with the Bovie electrocautery proximally along the descending colon. The distal sigmoid was elevated and retracted inferiorly.  The Ligasure was used to divide the mesocolon high up near the colon wall going  distally.  The rectum was divided with the contour stapler around 10 cm distal to the site of the mass. The abdomen was then irrigated copiously.  Prolene sutures were placed on the rectal stump.  Once the colon was pulled up out of the way, it was apparent that there was a mass of lymph nodes at the IMV.  This was mobilized and sent for permanent section.    Attention was directed to the left abdomen for a location for an  ostomy. Kochers were used to pull the fascia to the midline and an  another Fransisca Kaufmann was used to elevate the skin to create a circular site.  A divot of fatty tissue was taken as well. The fascia was opened in a  cruciate fashion separating the longitudinal fibers of the rectus. A  lap was placed behind the fascia to ensure that the Bovie could not go  through and puncture any of the intra-abdominal organs. A Kary Kos was  then advanced through the abdominal wall and used to retract the stump  of the descending colon through the abdominal wall.   The abdomen was then closed using running #1 looped PDS sutures. The wound  was then irrigated and dressed with a wound vac. The colon was then  opened with the Bovie.  3-0 Vicryl interrupted sutures were used to create the ostomy. This was then dressed with an ostomy appliance.   The patient was allowed to emerge from anesthesia and taken to the PACU in stable condition.  Needle, sponge, and instrument counts were correct x 2.

## 2020-04-04 NOTE — Consult Note (Signed)
[facility] Nurse requested for preoperative stoma site marking  Discussed surgical procedure and stoma creation with patient.  Explained role of the [facility] nurse team.  Provided the patient with educational booklet and provided samples of pouching options.  Answered patient's questions.   Examined patient lying and sitting upright in bed, in order to place the marking in the patient's visual field, away from any creases or abdominal contour issues and within the rectus muscle.  Attempted to mark below the patient's belt line.   Marked for colostomy in the LLQ  __6__ cm to the left of the umbilicus and _1___UL below the umbilicus.  Marked for ileostomy in the RLQ  __6__cm to the right of the umbilicus and  __8__ cm below the umbilicus.  Patient's abdomen cleansed with CHG wipes at site markings, allowed to air dry prior to marking. Pt plans for surgery today. [facility] Nurse team will follow up with patient after surgery for continued ostomy care and teaching if she receives an ostomy during surgery.  Julien Girt MSN, RN, [place], [place], [place]

## 2020-04-05 ENCOUNTER — Encounter (HOSPITAL_COMMUNITY): Payer: Self-pay | Admitting: General Surgery

## 2020-04-05 LAB — CBC
HCT: 33.2 % — ABNORMAL LOW (ref 36.0–46.0)
Hemoglobin: 10.3 g/dL — ABNORMAL LOW (ref 12.0–15.0)
MCH: 25.4 pg — ABNORMAL LOW (ref 26.0–34.0)
MCHC: 31 g/dL (ref 30.0–36.0)
MCV: 81.8 fL (ref 80.0–100.0)
Platelets: 223 10*3/uL (ref 150–400)
RBC: 4.06 MIL/uL (ref 3.87–5.11)
RDW: 21.4 % — ABNORMAL HIGH (ref 11.5–15.5)
WBC: 10.5 10*3/uL (ref 4.0–10.5)
nRBC: 0 % (ref 0.0–0.2)

## 2020-04-05 LAB — CYTOLOGY - NON PAP

## 2020-04-05 LAB — COMPREHENSIVE METABOLIC PANEL
ALT: 8 U/L (ref 0–44)
AST: 11 U/L — ABNORMAL LOW (ref 15–41)
Albumin: 1.9 g/dL — ABNORMAL LOW (ref 3.5–5.0)
Alkaline Phosphatase: 47 U/L (ref 38–126)
Anion gap: 17 — ABNORMAL HIGH (ref 5–15)
BUN: 12 mg/dL (ref 6–20)
CO2: 19 mmol/L — ABNORMAL LOW (ref 22–32)
Calcium: 8 mg/dL — ABNORMAL LOW (ref 8.9–10.3)
Chloride: 106 mmol/L (ref 98–111)
Creatinine, Ser: 0.58 mg/dL (ref 0.44–1.00)
GFR calc Af Amer: >60 mL/min (ref >60)
GFR calc non Af Amer: >60 mL/min (ref >60)
Glucose, Bld: 120 mg/dL — ABNORMAL HIGH (ref 70–99)
Potassium: 3.6 mmol/L (ref 3.5–5.1)
Sodium: 142 mmol/L (ref 135–145)
Total Bilirubin: 1 mg/dL (ref 0.3–1.2)
Total Protein: 5 g/dL — ABNORMAL LOW (ref 6.5–8.1)

## 2020-04-05 LAB — CULTURE, BLOOD (ROUTINE X 2)
Culture: NO GROWTH
Culture: NO GROWTH
Special Requests: ADEQUATE

## 2020-04-05 MED ORDER — KETOROLAC TROMETHAMINE 15 MG/ML IJ SOLN
15.0000 mg | Freq: Four times a day (QID) | INTRAMUSCULAR | Status: AC
  Administered 2020-04-05 – 2020-04-09 (×19): 15 mg via INTRAVENOUS
  Filled 2020-04-05 (×19): qty 1

## 2020-04-05 MED ORDER — POTASSIUM CHLORIDE CRYS ER 20 MEQ PO TBCR
40.0000 meq | EXTENDED_RELEASE_TABLET | Freq: Once | ORAL | Status: AC
  Administered 2020-04-05: 40 meq via ORAL
  Filled 2020-04-05: qty 2

## 2020-04-05 MED ORDER — POTASSIUM CHLORIDE 2 MEQ/ML IV SOLN
INTRAVENOUS | Status: DC
  Filled 2020-04-05 (×7): qty 1000

## 2020-04-05 MED ORDER — ENOXAPARIN SODIUM 40 MG/0.4ML ~~LOC~~ SOLN
40.0000 mg | Freq: Two times a day (BID) | SUBCUTANEOUS | Status: DC
  Administered 2020-04-05 – 2020-04-06 (×2): 40 mg via SUBCUTANEOUS
  Filled 2020-04-05 (×2): qty 0.4

## 2020-04-05 NOTE — Progress Notes (Signed)
PROGRESS NOTE    Stephanie [name]  OZD:[id number] DOB: [birth year] DOA: 03/31/2020 PCP: Patient, [name]    Chief Complaint  Patient presents with  . Abdominal Pain    Brief Narrative:  Stephanie [name] a 39 y.o.femalewith medical history significant ofrecent admission from June 14 to June 18 with abdominal pain and abscess with a question of sigmoidcolon malignancy versus diverticulitis. At that time she had an abnormal CT scan which also showed a liver mass. She had a barely elevated CEA at 4.9. She was placed on IV antibiotics, an attempt was made at liver biopsy however ultrasound suggested it might be a hemangioma. An MRI was equivocal. She was sent home on Augmentin which she completed and was doing well with until the last few days. She has continued to have blood in her stool. Stools are soft and not fully formed. She is passing gas. She has had increasing nausea with emesis over the last 12 hours. She reports ongoing lower abdominal cramping that is worse in the left lower quadrant. Her pain was 10 out of 10 requiring IV Dilaudid in the ED. She has an IUD in place, no sexual partner for the last 1 year. She denies vaginal discharge, vaginal bleeding, dysuria, hematuria, chest pain, or shortness of breath. In the ED she was sent for CT of the abdomen and pelvis which showed enlarging sigmoid colonic mass, which appeared which appears to be associated with focal perforation and has fistulized into the left adnexal region involving the left TOA. There are multiple enlarged lymph nodes which are bigger than before measuring up to 1.2 cm. The previously suspected metastatic lesion in the liver is also enlarged, small volume ascites. Patient placed empirically on IV antibiotics.  GI and general surgery were consulted.  Patient for exploratory laparotomy and partial colectomy with urethral stent placement today 04/04/2020 per general surgery. Patient with some complaints  of shortness of breath the morning of 04/04/2020, concern for volume overload and as such chest x-ray obtained and patient given a dose of IV Lasix, IV fluids discontinued.   Assessment & Plan:   Principal Problem:   Sepsis ([facility]) Active Problems:   Peritonitis with abscess of intestine (HCC)   Colorectal carcinoma (HCC)   Microcytic anemia   Intra-abdominal abscess (HCC)   SOB (shortness of breath)   Moderate protein-calorie malnutrition (HCC)   Nausea & vomiting   TOA (tubo-ovarian abscess)  #1 sepsis secondary to sigmoid colon cancer with pelvic abscess, POA Patient had presented with worsening abdominal pain, CT abdomen and pelvis done concerning for an enlarging which appeared which appears to be associated with focal perforation and has fistulized into the left adnexal region involving the left TOA. There are multiple enlarged lymph nodes which are bigger than before measuring up to 1.2 cm. The previously suspected metastatic lesion in the liver is also enlarged, small volume ascites. Due to the location of the lesion percutaneous approach was not possible when Dr. Avon Gully discussed with IR or GI. General surgery consulted and had recommended IV antibiotics which patient was started on and patient currently on cefepime plus IV Flagyl for intra-abdominal abscess with presumed malignancy.  CEA at 5.6.  Patient status post open sigmoid colectomy and end colostomy per Dr. Barry Dienes, general surgery with findings concerning for sigmoid colon cancer with pelvic abscess.  Biopsies pending.  Continue IV antibiotics, patient started on clear liquids, gentle hydration.  Awaiting pathology results prior to calling oncology.  Continue current pain management.  Per general surgery.    2.  Sigmoid colon cancer with pelvic abscess, POA  Presumed on admission secondary to enlarging mass noted on CT with enlarging lymph nodes and enlarging liver mass.  Patient was seen by general surgery and underwent  exploratory laparotomy with findings of sigmoid colon cancer with pelvic abscess, status post open sigmoid colectomy and end colostomy per Dr. Barry Dienes, general surgery (04/04/2020).  Awaiting pathology results prior to calling oncology.  Patient on empiric IV antibiotics.  Per general surgery.   3.  Shortness of breath Secondary to volume overload.  Patient with complaints of shortness of breath when laying in the supine position feels likely secondary to abdominal swelling and swelling all over on 04/04/2020.[name]  Patient denies any chest pain.  Patient noted to be 12 L positive from Is/Os  however urine output not properly recorded.  Patient's weight on 04/04/2020 was 88.1 kg from 77.1 kg on admission.  Chest x-ray which was done showed left lower lobe retrocardiac collapse/consolidation and small left effusion.  Patient given a dose of Lasix 40 mg IV x1 (04/04/2020) with a urine output of 3.175 L over the past 24 hours and clinical improvement.  Patient on gentle hydration as patient currently n.p.o.  Doubt if patient has a pneumonia however patient empirically on IV antibiotics.  Continue strict I's and O's, daily weights.  Follow.   4.  Nausea/vomiting/abdominal pain.   Nausea and vomiting have improved since admission.  Patient status post open sigmoid colectomy and end colostomy per general surgery 04/04/2020 with findings concerning for sigmoid colon cancer with pelvic abscess.  Biopsies pending.  Currently on a PCA Dilaudid pump.  See #1 and 2.  Supportive care.  5.  Acute blood loss anemia/chronic iron deficiency anemia Patient noted to have some scant bright red blood per rectum noted on previous exam is with recent bowel movement.  Hemoglobin currently stable at 10.3.  Follow H&H.  Transfusion threshold hemoglobin < 7.  Patient seen by GI who feel findings worrisome for colon cancer, colonoscopy currently high risk in the setting of adnexal abscess involving the small bowel and recommending consideration for  colonoscopy in 6 to 8 weeks for tissue diagnosis if patient does not require surgery prior to that time.  Patient underwent surgery yesterday and there was concern for sigmoid colon cancer with biopsies pending.  Outpatient follow-up with GI.  6.  Moderate protein calorie malnutrition In the setting of above.  Patient with intractable nausea vomiting and abdominal pain.  Status post open sigmoid colectomy and end colostomy.  Once patient has been placed on a diet will start nutritional supplementation.   7.  Hypokalemia Likely secondary to GI losses.  Potassium at 3.6.  Follow.    DVT prophylaxis: SCDs Code Status: Full Family Communication: Updated patient.  No family at bedside. Disposition:   Status is: Inpatient    Dispo: The patient is from: Home              Anticipated d/c is to: Home              Anticipated d/c date is:      To be determined.          Patient currently postop day 1 with open sigmoid colectomy and end colostomy, on PCA pain pump, on empiric IV antibiotics, biopsies pending.  Not stable for discharge.       Consultants:   General surgery: Dr. Leighton Ruff 10/01/2438  Gastroenterology: Dr. Tarri Glenn 04/01/2020  Wound care nurse Julien Girt, RN 04/04/2020  Procedures:   CT abdomen and pelvis 03/31/2020  Open sigmoid colectomy and end colostomy per Dr. Barry Dienes 04/04/2020  Antimicrobials:   IV cefepime 03/31/2020>>>>  Preop IV Cefotan 04/05/2019 21x1 dose  IV Flagyl 03/31/2020  IV Zosyn 04/01/2019 21x1 dose   Subjective: Patient laying in bed.  Complaining of diffuse abdominal pain around surgical site.  Abdominal pain that patient had presented with with some improvement postop and with PCA pump.  Denies any shortness of breath.  No chest pain.   Objective: Vitals:   04/05/20 0602 04/05/20 0602 04/05/20 0800 04/05/20 0855  BP: 117/74 117/74  118/78  Pulse: (!) 56 (!) 56  (!) 53  Resp: 15 15 18 17   Temp: 98.1 F (36.7 C) 98.1 F (36.7 C)  97.8 F (36.6  C)  TempSrc: Oral Oral  Oral  SpO2: 100% 100% 100% 99%  Weight:      Height:        Intake/Output Summary (Last 24 hours) at 04/05/2020 1125 Last data filed at 04/05/2020 0936 Gross per 24 hour  Intake 2900 ml  Output 1800 ml  Net 1100 ml   Filed Weights   03/31/20 0850  Weight: 77.1 kg    Examination:  General exam: NAD Respiratory system: Clear to auscultation anterior lung fields.  No wheezing, no crackles.  Fair air movement.  Speaking in full sentences.  Normal respiratory effort.  Cardiovascular system: Regular rate rhythm no murmurs rubs or gallops.  No JVD.  No lower extremity edema.  Gastrointestinal system: Abdomen is soft, hypoactive bowel sounds, nondistended, some tenderness to palpation diffusely.  No rebound.  No guarding.  Wound VAC over midline.  Left lower quadrant ostomy with minimal amount of liquid stool.  JP drain with serosanguineous fluid.  Central nervous system: Alert and oriented. No focal neurological deficits. Extremities: Symmetric 5 x 5 power. Skin: No rashes, lesions or ulcers Psychiatry: Judgement and insight appear normal. Mood & affect appropriate.     Data Reviewed: I have personally reviewed following labs and imaging studies  CBC: Recent Labs  Lab 04/01/20 0514 04/02/20 0605 04/03/20 0430 04/04/20 0445 04/05/20 0458  WBC 7.4 9.9 10.8* 9.8 10.5  HGB 11.6* 10.3* 10.0* 9.8* 10.3*  HCT 36.8 33.6* 32.7* 31.6* 33.2*  MCV 81.1 82.6 82.8 82.9 81.8  PLT 162 162 174 172 010    Basic Metabolic Panel: Recent Labs  Lab 04/01/20 0514 04/02/20 0605 04/03/20 0430 04/04/20 0445 04/05/20 0458  NA 137 137 137 139 142  K 4.0 3.9 3.7 3.2* 3.6  CL 105 105 103 105 106  CO2 23 25 26 23  19*  GLUCOSE 95 92 95 76 120*  BUN 13 12 10 10 12   CREATININE 0.68 0.53 0.52 0.60 0.58  CALCIUM 8.0* 8.2* 7.9* 7.9* 8.0*  MG  --   --   --  1.9  --     GFR: Estimated Creatinine Clearance: 98.7 mL/min (by C-G formula based on SCr of 0.58 mg/dL).  Liver  Function Tests: Recent Labs  Lab 04/01/20 0514 04/02/20 0605 04/03/20 0430 04/04/20 0445 04/05/20 0458  AST 16 14* 12* 10* 11*  ALT 16 13 10 8 8   ALKPHOS 43 45 50 53 47  BILITOT 0.9 0.5 0.8 0.9 1.0  PROT 5.7* 5.3* 5.0* 5.3* 5.0*  ALBUMIN 2.6* 2.4* 2.2* 2.2* 1.9*    CBG: No results for input(s): GLUCAP in the last 168 hours.   Recent Results (from the past 240  hour(s))  Blood culture (routine x 2)     Status: None   Collection Time: 03/31/20 12:52 PM   Specimen: BLOOD LEFT FOREARM  Result Value Ref Range Status   Specimen Description   Final    BLOOD LEFT FOREARM Performed at [facility], [place] [address] [place], [place] [address]    Special Requests   Final    BOTTLES DRAWN AEROBIC AND ANAEROBIC Blood Culture results may not be optimal due to an inadequate volume of blood received in culture bottles Performed at [facility] [address], [place], [place] [address]    Culture   Final    NO GROWTH 5 DAYS Performed at [facility], [place] [address] [place], [place] [address]    Report Status 04/05/2020 FINAL  Final  Blood culture (routine x 2)     Status: None   Collection Time: 03/31/20 12:53 PM   Specimen: BLOOD  Result Value Ref Range Status   Specimen Description   Final    BLOOD LEFT WRIST Performed at [facility] [address], [place], [place] [address]    Special Requests   Final    BOTTLES DRAWN AEROBIC AND ANAEROBIC Blood Culture adequate volume Performed at [facility] [address], [place], [place] [address]    Culture   Final    NO GROWTH 5 DAYS Performed at [facility], [place] [address] [place], [place] [address]    Report Status 04/05/2020 FINAL  Final  SARS Coronavirus 2 by RT PCR (hospital order, performed in [facility] hospital lab) Nasopharyngeal Nasopharyngeal Swab     Status: None   Collection Time: 03/31/20 12:53 PM   Specimen: Nasopharyngeal Swab  Result  Value Ref Range Status   SARS Coronavirus 2 NEGATIVE NEGATIVE Final    Comment: (NOTE) SARS-CoV-2 target nucleic acids are NOT DETECTED.  The SARS-CoV-2 RNA is generally detectable in upper and lower respiratory specimens during the acute phase of infection. The lowest concentration of SARS-CoV-2 viral copies this assay can detect is 250 copies / mL. A negative result does not preclude SARS-CoV-2 infection and should not be used as the sole basis for treatment or other patient management decisions.  A negative result may occur with improper specimen collection / handling, submission of specimen other than nasopharyngeal swab, presence of viral mutation(s) within the areas targeted by this assay, and inadequate number of viral copies (<250 copies / mL). A negative result must be combined with clinical observations, patient history, and epidemiological information.  Fact Sheet for Patients:   StrictlyIdeas.no  Fact Sheet for Healthcare Providers: BankingDealers.co.za  This test is not yet approved or  cleared by the [place] FDA and has been authorized for detection and/or diagnosis of SARS-CoV-2 by FDA under an Emergency Use Authorization (EUA).  This EUA will remain in effect (meaning this test can be used) for the duration of the COVID-19 declaration under Section 564(b)(1) of the Act, 21 U.S.C. section 360bbb-3(b)(1), unless the authorization is terminated or revoked sooner.  Performed at [facility], [place] [address] [place], [place] [address]   Surgical PCR screen     Status: None   Collection Time: 04/03/20  4:52 PM   Specimen: Nasal Mucosa; Nasal Swab  Result Value Ref Range Status   MRSA, PCR NEGATIVE NEGATIVE Final   Staphylococcus aureus NEGATIVE NEGATIVE Final    Comment: (NOTE) The Xpert SA Assay (FDA approved for NASAL  specimens in patients 87 years of age and older), is one component of a  comprehensive surveillance program. It is not intended to diagnose infection nor to guide or monitor treatment. Performed at [facility], [place] [address], [place], [place] [address]   Aerobic/Anaerobic Culture (surgical/deep wound)     Status: None (Preliminary result)   Collection Time: 04/04/20 12:46 PM   Specimen: Abdomen; Abscess  Result Value Ref Range Status   Specimen Description   Final    ABDOMEN PERITONEAL ABSCESS Performed at [facility] [address] [place], [place] [address]    Special Requests   Final    NONE Performed at [facility], [place] [address], [place], [place] [address]    Gram Stain   Final    RARE WBC PRESENT, PREDOMINANTLY MONONUCLEAR NO ORGANISMS SEEN    Culture   Final    NO GROWTH < 24 HOURS Performed at [facility], [place] [address] [place], [place] [address]    Report Status PENDING  Incomplete         Radiology Studies: DG CHEST PORT 1 VIEW  Result Date: 04/04/2020 CLINICAL DATA:  Preoperative evaluation for exploratory laparotomy EXAM: PORTABLE CHEST 1 VIEW COMPARISON:  03/31/2020 abdomen CT, 10/19/2012 chest x-ray FINDINGS: Lower lung volumes. Left lower lobe collapse/consolidation noted with an associated small left effusion. Difficult to exclude left lower lung pneumonia. Normal heart size and vascularity. Trachea midline. No acute osseous finding. IMPRESSION: Left lower lobe retrocardiac collapse/consolidation and small left effusion. Low lung volumes. Electronically Signed   By: Jerilynn Mages.  Shick M.D.   On: 04/04/2020 10:27   DG C-Arm 1-60 Min-No Report  Result Date: 04/04/2020 Fluoroscopy was utilized by the requesting physician.  No radiographic interpretation.        Scheduled Meds: . alvimopan  12 mg Oral BID  . Chlorhexidine Gluconate Cloth  6 each Topical Daily  . enoxaparin (LOVENOX) injection  40 mg Subcutaneous Q12H  . HYDROmorphone   Intravenous Q4H  . ketorolac   15 mg Intravenous Q6H  . scopolamine  1 patch Transdermal On Call to OR   Continuous Infusions: . ceFEPime (MAXIPIME) IV 2 g (04/05/20 0612)  . famotidine (PEPCID) IV 20 mg (04/05/20 1021)  . lactated ringers with kcl    . metronidazole 500 mg (04/05/20 0516)     LOS: 5 days    Time spent: 40 minutes    Irine Seal, MD [facility]   To contact the attending provider between 7A-7P or the covering provider during after hours 7P-7A, please log into the web site [email or web address] and access using universal [place] password for that web site. If you do not have the password, please call the hospital operator.  04/05/2020, 11:25 AM

## 2020-04-05 NOTE — Consult Note (Addendum)
[facility] Nurse Consult Note: Pt had surgery yesterday; Vac is intact with cont suction on at 138mm to midline abd.  Located in close proximity to the ostomy pouch.  Will perform first post-op pouch change and Vac dressing tomorrow.   [facility] Nurse ostomy follow up Stoma type/location: Colostomy stoma is red and viable when visualized through the pouch, which is intact with good seal and small amt liquid brown stool. Supplies and educational materials in the room and will perform teaching session tomorrow during pouch and Vac dressing change, since pt is feeling poorly on the first post-op day.  Enrolled patient in [place] Start Discharge program: Yes Julien Girt MSN, RN, [place], [place], [place]

## 2020-04-05 NOTE — Progress Notes (Signed)
1 Day Post-Op    CC: Abdominal pain  Subjective: Patient is doing fairly well this a.m. after her surgery.  She is having a fair amount of discomfort but seems to be handling it fairly well with the PCA.  She has a wound VAC over the midline.  There is a small amount of liquid stool in the ostomy.  JP drain is serosanguineous.  Objective: Vital signs in last 24 hours: Temp:  [97.6 F (36.4 C)-99.1 F (37.3 C)] 98.1 F (36.7 C) (07/08 0602) Pulse Rate:  [54-106] 56 (07/08 0602) Resp:  [11-19] 15 (07/08 0602) BP: (101-143)/(49-90) 117/74 (07/08 0602) SpO2:  [95 %-100 %] 100 % (07/08 0602) Last BM Date: 04/03/20 N.p.o. 2900 IV 3175 urine Drain 95 Ostomy 0 Afebrile, vital signs are stable Labs are all stable postop.  Intake/Output from previous day: 07/07 0701 - 07/08 0700 In: 2900 [I.V.:2400; IV Piggyback:500] Out: 1751 [Urine:3175; Drains:95; Blood:150] Intake/Output this shift: No intake/output data recorded.  General appearance: alert, cooperative and no distress Resp: clear to auscultation bilaterally; moving about 700 with incentive spirometry. GI: Postop with a wound VAC over the midline.  Left lower quadrant ostomy with small amount brown liquid stool in the bag.  JP drain with serosanguineous fluid.  No bowel sounds or flatus.  Lab Results:  Recent Labs    04/04/20 0445 04/05/20 0458  WBC 9.8 10.5  HGB 9.8* 10.3*  HCT 31.6* 33.2*  PLT 172 223    BMET Recent Labs    04/04/20 0445 04/05/20 0458  NA 139 142  K 3.2* 3.6  CL 105 106  CO2 23 19*  GLUCOSE 76 120*  BUN 10 12  CREATININE 0.60 0.58  CALCIUM 7.9* 8.0*   PT/INR Recent Labs    04/04/20 0445  LABPROT 17.0*  INR 1.4*    Recent Labs  Lab 04/01/20 0514 04/02/20 0605 04/03/20 0430 04/04/20 0445 04/05/20 0458  AST 16 14* 12* 10* 11*  ALT 16 13 10 8 8   ALKPHOS 43 45 50 53 47  BILITOT 0.9 0.5 0.8 0.9 1.0  PROT 5.7* 5.3* 5.0* 5.3* 5.0*  ALBUMIN 2.6* 2.4* 2.2* 2.2* 1.9*     Lipase      Component Value Date/Time   LIPASE 18 03/31/2020 0910     Medications: . alvimopan  12 mg Oral BID  . Chlorhexidine Gluconate Cloth  6 each Topical Daily  . HYDROmorphone   Intravenous Q4H  . potassium chloride  40 mEq Oral Once  . scopolamine  1 patch Transdermal On Call to OR   . ceFEPime (MAXIPIME) IV 2 g (04/05/20 0612)  . famotidine (PEPCID) IV 20 mg (04/04/20 2136)  . metronidazole 500 mg (04/05/20 0516)    Assessment/Plan  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 [name] POD #1  FEN: IV fluids/clear liquids ID: Maxipime/Flagyl 7/3>> day 6 DVT:  Restart Lovenox today Follow up:  Dr. Barry Dienes  Plan: Out of bed to chair, began to mobilize.  DC Foley, continue antibiotics, I put her back on some lactated Ringer's at 50 cc/h until she is taking a diet.  Restart Lovenox this evening.    LOS: 5 days    Nikkole Placzek 04/05/2020 Please see Amion

## 2020-04-06 ENCOUNTER — Telehealth: Payer: Self-pay | Admitting: Nurse Practitioner

## 2020-04-06 ENCOUNTER — Other Ambulatory Visit: Payer: Self-pay | Admitting: Oncology

## 2020-04-06 DIAGNOSIS — C19 Malignant neoplasm of rectosigmoid junction: Secondary | ICD-10-CM

## 2020-04-06 LAB — CBC
HCT: 29.8 % — ABNORMAL LOW (ref 36.0–46.0)
Hemoglobin: 9.3 g/dL — ABNORMAL LOW (ref 12.0–15.0)
MCH: 25.3 pg — ABNORMAL LOW (ref 26.0–34.0)
MCHC: 31.2 g/dL (ref 30.0–36.0)
MCV: 81 fL (ref 80.0–100.0)
Platelets: 204 10*3/uL (ref 150–400)
RBC: 3.68 MIL/uL — ABNORMAL LOW (ref 3.87–5.11)
RDW: 21.5 % — ABNORMAL HIGH (ref 11.5–15.5)
WBC: 8.7 10*3/uL (ref 4.0–10.5)
nRBC: 0 % (ref 0.0–0.2)

## 2020-04-06 LAB — COMPREHENSIVE METABOLIC PANEL
ALT: 8 U/L (ref 0–44)
AST: 14 U/L — ABNORMAL LOW (ref 15–41)
Albumin: 1.9 g/dL — ABNORMAL LOW (ref 3.5–5.0)
Alkaline Phosphatase: 39 U/L (ref 38–126)
Anion gap: 7 (ref 5–15)
BUN: 19 mg/dL (ref 6–20)
CO2: 26 mmol/L (ref 22–32)
Calcium: 8 mg/dL — ABNORMAL LOW (ref 8.9–10.3)
Chloride: 108 mmol/L (ref 98–111)
Creatinine, Ser: 0.44 mg/dL (ref 0.44–1.00)
GFR calc Af Amer: >60 mL/min (ref >60)
GFR calc non Af Amer: >60 mL/min (ref >60)
Glucose, Bld: 117 mg/dL — ABNORMAL HIGH (ref 70–99)
Potassium: 3.3 mmol/L — ABNORMAL LOW (ref 3.5–5.1)
Sodium: 141 mmol/L (ref 135–145)
Total Bilirubin: 0.5 mg/dL (ref 0.3–1.2)
Total Protein: 4.9 g/dL — ABNORMAL LOW (ref 6.5–8.1)

## 2020-04-06 LAB — MAGNESIUM: Magnesium: 2 mg/dL (ref 1.7–2.4)

## 2020-04-06 MED ORDER — METHOCARBAMOL 500 MG PO TABS
500.0000 mg | ORAL_TABLET | Freq: Three times a day (TID) | ORAL | Status: DC | PRN
  Administered 2020-04-07 – 2020-04-11 (×3): 500 mg via ORAL
  Filled 2020-04-06 (×4): qty 1

## 2020-04-06 MED ORDER — ENOXAPARIN SODIUM 40 MG/0.4ML ~~LOC~~ SOLN
40.0000 mg | SUBCUTANEOUS | Status: DC
  Administered 2020-04-07 – 2020-04-15 (×9): 40 mg via SUBCUTANEOUS
  Filled 2020-04-06 (×9): qty 0.4

## 2020-04-06 MED ORDER — FAMOTIDINE 20 MG PO TABS
20.0000 mg | ORAL_TABLET | Freq: Two times a day (BID) | ORAL | Status: DC
  Administered 2020-04-06 – 2020-04-15 (×20): 20 mg via ORAL
  Filled 2020-04-06 (×20): qty 1

## 2020-04-06 MED ORDER — PROCHLORPERAZINE EDISYLATE 10 MG/2ML IJ SOLN
10.0000 mg | INTRAMUSCULAR | Status: DC | PRN
  Administered 2020-04-06 – 2020-04-10 (×6): 10 mg via INTRAVENOUS
  Filled 2020-04-06 (×6): qty 2

## 2020-04-06 MED ORDER — POTASSIUM CHLORIDE CRYS ER 20 MEQ PO TBCR
40.0000 meq | EXTENDED_RELEASE_TABLET | Freq: Once | ORAL | Status: AC
  Administered 2020-04-06: 40 meq via ORAL
  Filled 2020-04-06: qty 2

## 2020-04-06 MED ORDER — BETHANECHOL CHLORIDE 10 MG PO TABS
10.0000 mg | ORAL_TABLET | Freq: Three times a day (TID) | ORAL | Status: DC
  Administered 2020-04-06 – 2020-04-10 (×13): 10 mg via ORAL
  Filled 2020-04-06 (×15): qty 1

## 2020-04-06 MED ORDER — ACETAMINOPHEN 500 MG PO TABS
1000.0000 mg | ORAL_TABLET | Freq: Three times a day (TID) | ORAL | Status: DC
  Administered 2020-04-06 – 2020-04-16 (×26): 1000 mg via ORAL
  Filled 2020-04-06 (×28): qty 2

## 2020-04-06 MED ORDER — OXYCODONE HCL 5 MG PO TABS
5.0000 mg | ORAL_TABLET | ORAL | Status: DC | PRN
  Administered 2020-04-06 – 2020-04-14 (×17): 10 mg via ORAL
  Administered 2020-04-15 (×4): 5 mg via ORAL
  Administered 2020-04-15 – 2020-04-16 (×3): 10 mg via ORAL
  Filled 2020-04-06 (×11): qty 2
  Filled 2020-04-06: qty 1
  Filled 2020-04-06 (×14): qty 2
  Filled 2020-04-06: qty 1

## 2020-04-06 NOTE — Progress Notes (Signed)
PROGRESS NOTE    Stephanie [name]  XTG:[id number] DOB: 02/27/1981 DOA: 03/31/2020 PCP: Patient, [name]    Chief Complaint  Patient presents with  . Abdominal Pain    Brief Narrative:  Stephanie Guillen Mittelmanis a 39 y.o.femalewith medical history significant ofrecent admission from June 14 to June 18 with abdominal pain and abscess with a question of sigmoidcolon malignancy versus diverticulitis. At that time she had an abnormal CT scan which also showed a liver mass. She had a barely elevated CEA at 4.9. She was placed on IV antibiotics, an attempt was made at liver biopsy however ultrasound suggested it might be a hemangioma. An MRI was equivocal. She was sent home on Augmentin which she completed and was doing well with until the last few days. She has continued to have blood in her stool. Stools are soft and not fully formed. She is passing gas. She has had increasing nausea with emesis over the last 12 hours. She reports ongoing lower abdominal cramping that is worse in the left lower quadrant. Her pain was 10 out of 10 requiring IV Dilaudid in the ED. She has an IUD in place, no sexual partner for the last 1 year. She denies vaginal discharge, vaginal bleeding, dysuria, hematuria, chest pain, or shortness of breath. In the ED she was sent for CT of the abdomen and pelvis which showed enlarging sigmoid colonic mass, which appeared which appears to be associated with focal perforation and has fistulized into the left adnexal region involving the left TOA. There are multiple enlarged lymph nodes which are bigger than before measuring up to 1.2 cm. The previously suspected metastatic lesion in the liver is also enlarged, small volume ascites. Patient placed empirically on IV antibiotics.  GI and general surgery were consulted.  Patient for exploratory laparotomy and partial colectomy with urethral stent placement today 04/04/2020 per general surgery. Patient with some complaints  of shortness of breath the morning of 04/04/2020, concern for volume overload and as such chest x-ray obtained and patient given a dose of IV Lasix, IV fluids discontinued.   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 There are multiple enlarged lymph nodes which are bigger than before measuring up to 1.2 cm. The previously suspected metastatic lesion in the liver is also enlarged, small volume ascites. Due to the location of the lesion percutaneous approach was not possible when Dr. Avon Gully discussed with IR or GI. General surgery consulted and had recommended IV antibiotics which patient was started on and patient currently on cefepime plus IV Flagyl for intra-abdominal abscess with presumed malignancy.  CEA at 5.6.  Patient status post open sigmoid colectomy and end colostomy per Dr. Barry Dienes, general surgery with findings concerning for sigmoid colon cancer with pelvic abscess.  Biopsies  consistent with invasive moderately differentiated adenocarcinoma, 6 cm, involving rectosigmoid junction. Carcinoma invades into serosal surface with perforation and associated serositis. Radial section margin is  positive for carcinoma: Proximal and distal margins are not involved. Lymphovascular invasion is present. Metastatic carcinoma to 1 of 15 lymph nodes, one tumor deposit. Consult with oncology for further  evaluation and management.  Continue IV antibiotics, patient started on clear liquids and diet being advanced to a full liquid diet. Gentle hydration. Patient being transitioned from Dilaudid PCA pump to oral pain medication per general surgery. Per general surgery.    2.  Sigmoid colon cancer with pelvic abscess, POA  Presumed on admission secondary to enlarging mass noted on CT with enlarging lymph nodes and enlarging liver mass.  Patient was seen by general surgery and underwent exploratory laparotomy with findings of sigmoid colon cancer with pelvic abscess, status post open sigmoid colectomy and end colostomy per Dr. Barry Dienes, general surgery (04/04/2020). Pathology consistent with invasive moderately differentiated adenocarcinoma, 6 cm, involving rectosigmoid junction. Carcinoma invades into serosal surface with perforation and associated serositis. Radial section margin is positive for carcinoma: Proximal and distal margins are not involved. Lymphovascular invasion is present. Metastatic carcinoma to 1 of 15 lymph nodes, one tumor deposit. Consult with oncology for further evaluation and management. Continue empiric IV antibiotics. Per general surgery.   3.  Shortness of breath Secondary to volume overload.  Patient with complaints of shortness of breath when laying in the supine position feels likely secondary to abdominal swelling and swelling all over on 04/04/2020.Stephanie [name]  Patient denies any chest pain.  Patient noted to be 12 L positive from Is/Os  however urine output not properly recorded.  Patient's weight on 04/04/2020 was 88.1 kg from 77.1 kg on admission.  Chest x-ray which was done showed left lower lobe retrocardiac collapse/consolidation and small left effusion.  Patient given a dose of Lasix 40 mg IV x1 (04/04/2020) with a urine output of 3.175 L. Clinical improvement. Patient on gentle hydration.  Doubt if patient has a pneumonia however patient empirically on IV antibiotics.  Continue strict I's and  O's, daily weights.  Follow.   4.  Nausea/vomiting/abdominal pain.   Nausea and vomiting have improved since admission.  Patient status post open sigmoid colectomy and end colostomy per general surgery 04/04/2020 with findings concerning for sigmoid colon cancer with pelvic abscess.  Biopsies consistent with invasive moderately differentiated adenocarcinoma, 6 cm, involving rectosigmoid junction. Currently on Dilaudid PCA pump and being transitioned to oral pain medication.   See #1 and 2.  Supportive care.  5.  Acute blood loss anemia/chronic iron deficiency anemia Patient noted to have some scant bright red blood per rectum noted on previous exam is with recent bowel movement.  Hemoglobin currently at 9.3.  Follow H&H.  Transfusion threshold hemoglobin < 7.  Patient seen by GI who feel findings worrisome for colon cancer, colonoscopy currently high risk in the setting of adnexal abscess involving the small bowel and recommending consideration for colonoscopy in 6 to 8 weeks for tissue diagnosis if patient does not require surgery prior to that time.  Patient underwent surgery on 04/04/2020 with biopsies consistent with adenocarcinoma. Consult with oncology. Outpatient follow-up with GI.  6.  Moderate protein calorie malnutrition In the setting of above.  Patient with intractable nausea vomiting and abdominal pain.  Status post open sigmoid colectomy and end colostomy.  Once patient has been placed on a diet will start nutritional supplementation.   7.  Hypokalemia Likely secondary to GI losses. Potassium at 3.3. K. Dur 40 mEq p.o. x1. Repeat labs in the morning.  8. Urinary retention Foley catheter discontinued per general  surgery yesterday. Patient with urinary retention and underwent I andO cath. Foley reordered per general surgery and patient started on Urecholine.  DVT prophylaxis: SCDs Code Status: Full Family Communication: Updated patient.  No family at bedside. Disposition:   Status is:  Inpatient    Dispo: The patient is from: Home              Anticipated d/c is to: Home              Anticipated d/c date is:      To be determined.          Patient currently postop day 1 with open sigmoid colectomy and end colostomy, on PCA pain pump, on empiric IV antibiotics, biopsies consistent with adenocarcinoma. Oncology consultation pending. General surgery following. Not stable for discharge.        Consultants:   General surgery: Dr. Leighton Ruff 6/0/1093  Gastroenterology: Dr. Tarri Glenn 04/01/2020  Wound care nurse Julien Girt, RN 04/04/2020  Oncology pending  Procedures:   CT abdomen and pelvis 03/31/2020  Open sigmoid colectomy and end colostomy per Dr. Barry Dienes 04/04/2020  Antimicrobials:   IV cefepime 03/31/2020>>>>  Preop IV Cefotan 04/05/2019 21x1 dose  IV Flagyl 03/31/2020>>>>>>>  IV Zosyn 04/01/2019 21x1 dose   Subjective: Patient laying in bed sleeping but arousable. Diffuse abdominal discomfort around surgical site. Abdominal pain patient presented with has improved per patient. On Dilaudid PCA pump. Denies chest pain. No shortness of breath.   Objective: Vitals:   04/06/20 0433 04/06/20 0557 04/06/20 0737 04/06/20 0906  BP:  109/73  113/70  Pulse:  (!) 52  (!) 52  Resp: 15 15 13 18   Temp:  (!) 97.5 F (36.4 C)  98 F (36.7 C)  TempSrc:  Oral  Oral  SpO2: 98% 98% 97% 99%  Weight:      Height:        Intake/Output Summary (Last 24 hours) at 04/06/2020 1035 Last data filed at 04/06/2020 0909 Gross per 24 hour  Intake 3047.12 ml  Output 1015 ml  Net 2032.12 ml   Filed Weights   03/31/20 0850  Weight: 77.1 kg    Examination:  General exam: NAD Respiratory system: Lungs clear to auscultation bilaterally anterior lung fields. No wheezes, no crackles, no rhonchi. Fair air movement. Speaking in full sentences. Normal respiratory effort. Cardiovascular system: RRR no murmurs rubs or gallops. No JVD. No lower extremity edema. Gastrointestinal system:  Abdomen is soft, some diffuse tenderness to palpation, hypoactive bowel sounds, wound VAC over midline. Left lower quadrant ostomy. JP drain with serosanguineous fluid.  Central nervous system: Alert and oriented. No focal neurological deficits. Extremities: Symmetric 5 x 5 power. Skin: No rashes, lesions or ulcers Psychiatry: Judgement and insight appear normal. Mood & affect appropriate.     Data Reviewed: I have personally reviewed following labs and imaging studies  CBC: Recent Labs  Lab 04/02/20 0605 04/03/20 0430 04/04/20 0445 04/05/20 0458 04/06/20 0430  WBC 9.9 10.8* 9.8 10.5 8.7  HGB 10.3* 10.0* 9.8* 10.3* 9.3*  HCT 33.6* 32.7* 31.6* 33.2* 29.8*  MCV 82.6 82.8 82.9 81.8 81.0  PLT 162 174 172 223 235    Basic Metabolic Panel: Recent Labs  Lab 04/02/20 0605 04/03/20 0430 04/04/20 0445 04/05/20 0458 04/06/20 0430  NA 137 137 139 142 141  K 3.9 3.7 3.2* 3.6 3.3*  CL 105 103 105 106 108  CO2 25 26 23  19* 26  GLUCOSE 92 95 76 120* 117*  BUN 12 10  10 12 19   CREATININE 0.53 0.52 0.60 0.58 0.44  CALCIUM 8.2* 7.9* 7.9* 8.0* 8.0*  MG  --   --  1.9  --  2.0    GFR: Estimated Creatinine Clearance: 98.7 mL/min (by C-G formula based on SCr of 0.44 mg/dL).  Liver Function Tests: Recent Labs  Lab 04/02/20 0605 04/03/20 0430 04/04/20 0445 04/05/20 0458 04/06/20 0430  AST 14* 12* 10* 11* 14*  ALT 13 10 8 8 8   ALKPHOS 45 50 53 47 39  BILITOT 0.5 0.8 0.9 1.0 0.5  PROT 5.3* 5.0* 5.3* 5.0* 4.9*  ALBUMIN 2.4* 2.2* 2.2* 1.9* 1.9*    CBG: No results for input(s): GLUCAP in the last 168 hours.   Recent Results (from the past 240 hour(s))  Blood culture (routine x 2)     Status: None   Collection Time: 03/31/20 12:52 PM   Specimen: BLOOD LEFT FOREARM  Result Value Ref Range Status   Specimen Description   Final    BLOOD LEFT FOREARM Performed at [facility], [place] [address], [place], [place] [address]    Special Requests   Final    BOTTLES DRAWN AEROBIC  AND ANAEROBIC Blood Culture results may not be optimal due to an inadequate volume of blood received in culture bottles Performed at [facility] [address] [place], [place] [address]    Culture   Final    NO GROWTH 5 DAYS Performed at [facility], [place] [address] [place], [place] [address]    Report Status 04/05/2020 FINAL  Final  Blood culture (routine x 2)     Status: None   Collection Time: 03/31/20 12:53 PM   Specimen: BLOOD  Result Value Ref Range Status   Specimen Description   Final    BLOOD LEFT WRIST Performed at [facility] [address] [place], [place] [address]    Special Requests   Final    BOTTLES DRAWN AEROBIC AND ANAEROBIC Blood Culture adequate volume Performed at [facility] [address], [place], [place] [address]    Culture   Final    NO GROWTH 5 DAYS Performed at [facility], [place] [address] [place],  [address]    Report Status 04/05/2020 FINAL  Final  SARS Coronavirus 2 by RT PCR (hospital order, performed in [facility] hospital lab) Nasopharyngeal Nasopharyngeal Swab     Status: None   Collection Time: 03/31/20 12:53 PM   Specimen: Nasopharyngeal Swab  Result Value Ref Range Status   SARS Coronavirus 2 NEGATIVE NEGATIVE Final    Comment: (NOTE) SARS-CoV-2 target nucleic acids are NOT DETECTED.  The SARS-CoV-2 RNA is generally detectable in upper and lower respiratory specimens during the acute phase of infection. The lowest concentration of SARS-CoV-2 viral copies this assay can detect is 250 copies / mL. A negative result does not preclude SARS-CoV-2 infection and should not be used as the sole basis for treatment or other patient management decisions.  A negative result may occur with improper specimen collection / handling, submission of specimen other than nasopharyngeal swab, presence of viral mutation(s) within the areas targeted by this assay, and  inadequate number of viral copies (<250 copies / mL). A negative result must be combined with clinical observations, patient history, and epidemiological information.  Fact Sheet for Patients:   StrictlyIdeas.no  Fact Sheet for Healthcare Providers: BankingDealers.co.za  This test is not yet approved or  cleared by the [place]  States FDA and has been authorized for detection and/or diagnosis of SARS-CoV-2 by FDA under an Emergency Use Authorization (EUA).  This EUA will remain in effect (meaning this test can be used) for the duration of the COVID-19 declaration under Section 564(b)(1) of the Act, 21 U.S.C. section 360bbb-3(b)(1), unless the authorization is terminated or revoked sooner.  Performed at [facility], [place] [address], [place], [place] [address]   Surgical PCR screen     Status: None   Collection Time: 04/03/20  4:52 PM   Specimen: Nasal Mucosa; Nasal Swab  Result Value Ref Range Status   MRSA, PCR NEGATIVE NEGATIVE Final   Staphylococcus aureus NEGATIVE NEGATIVE Final    Comment: (NOTE) The Xpert SA Assay (FDA approved for NASAL specimens in patients 49 years of age and older), is one component of a comprehensive surveillance program. It is not intended to diagnose infection nor to guide or monitor treatment. Performed at [facility], [place] [address], [place], [place] [address]   Aerobic/Anaerobic Culture (surgical/deep wound)     Status: None (Preliminary result)   Collection Time: 04/04/20 12:46 PM   Specimen: Abdomen; Abscess  Result Value Ref Range Status   Specimen Description   Final    ABDOMEN PERITONEAL ABSCESS Performed at [facility] [address] [place], [place] [address]    Special Requests   Final    NONE Performed at [facility], [place] [address], [place], [place] [address]    Gram Stain   Final    RARE WBC PRESENT,  PREDOMINANTLY MONONUCLEAR NO ORGANISMS SEEN    Culture   Final    NO GROWTH < 24 HOURS Performed at [facility], [place] [address] [place], [place] [address]    Report Status PENDING  Incomplete         Radiology Studies: DG C-Arm 1-60 Min-No Report  Result Date: 04/04/2020 Fluoroscopy was utilized by the requesting physician.  No radiographic interpretation.        Scheduled Meds: . acetaminophen  1,000 mg Oral Q8H  . alvimopan  12 mg Oral BID  . Chlorhexidine Gluconate Cloth  6 each Topical Daily  . enoxaparin (LOVENOX) injection  40 mg Subcutaneous Q12H  . HYDROmorphone   Intravenous Q4H  . ketorolac  15 mg Intravenous Q6H  . scopolamine  1 patch Transdermal On Call to OR   Continuous Infusions: . ceFEPime (MAXIPIME) IV 2 g (04/06/20 0505)  . famotidine (PEPCID) IV 20 mg (04/05/20 2151)  . lactated ringers with kcl 50 mL/hr at 04/05/20 0900  . metronidazole 500 mg (04/06/20 0552)     LOS: 6 days    Time spent: 40 minutes    Irine Seal, MD [facility]   To contact the attending provider between 7A-7P or the covering provider during after hours 7P-7A, please log into the web site [email or web address] and access using universal [facility] password for that web site. If you do not have the password, please call the hospital operator.  04/06/2020, 10:35 AM

## 2020-04-06 NOTE — Progress Notes (Addendum)
Patient foley was removed in the evening yesterday and she was due to void a few hours ago. Attempted to void twice without success. She was bladder scanned, 297 mls of urine was shown. NP Sharlet Salina was notified.

## 2020-04-06 NOTE — Progress Notes (Signed)
The patient is receiving famotidine by the intravenous route.  Based on criteria approved by the Pharmacy and [facility], the medication is being converted to the equivalent oral dose form.  These criteria include: -No active GI bleeding -Able to tolerate diet of full liquids (or better) or tube feeding -Able to tolerate other medications by the oral or enteral route  If you have any questions about this conversion, please contact the Pharmacy Department (phone [phone]).  Thank you.

## 2020-04-06 NOTE — Progress Notes (Signed)
Brief oncology note:  On-call physician received referral for this patient due to new diagnosis of colon cancer.  Will arrange for outpatient follow-up with one of our GI oncologist.  Referral for new patient has been entered into epic.  GI navigator notified of new patient referral as well and to add to GI tumor conference list.  Mikey Bussing, DNP, AGPCNP-BC, AOCNP Mon/Tues/Thurs/Fri 7am-5pm; Off Wednesdays Cell: [phone]

## 2020-04-06 NOTE — Consult Note (Addendum)
[facility] Nurse Consult Note: Vac dressing changed with surgical PA at the bedside to assess the wound appearance during the first post-op dressing change.  Midline full thickness wound is 100% beefy red with small amt blood-tinged drainage.16X5X1.5cm.  Pt was medicated for pain prior to the procedure and tolerated with mod amt discomfort.  Applied one piece black foam to 134mm cont suction. [place] team will plan to change dressing again on Mon, related to the close proximity to the ostomy pouch. Extra Vac supplies ordered to the bedside for use next week.   [facility] Nurse ostomy follow up Stoma type/location: Stoma is red and viable, 1 3/4 inches and above skin level, slightly edematous and weeping clear fluid.  Peristomal assessment: intact skin surrounding Output: 50cc liquid brown stool  Ostomy pouching: 2pc.  Education provided:  Demonstrated pouch change using barrier ring to attempt to maintain a seal and 2 piece pouching system.  Pt had just been informed she has cancer and was upset and not very engaged in the process today. Attempted to provide support; she is also concerned about lack of insurance. Informed her the [place] team will perform another teaching session on Mon. She watched the process but did not assist or ask questions. Discussed indigent contact information and left the phone number at the bedside, along with educational materials and 5 sets of barrier rings, wafers, and pouches.   Enrolled patient in [place] Start Discharge program: Yes, previously Julien Girt MSN, [place], [place], [place], [place]

## 2020-04-06 NOTE — Telephone Encounter (Signed)
Received a new patient referral from the hospital for colorectal cancer. [name] has been scheduled to see [name] on 7/22 at 1:45pm. Letter mailed.

## 2020-04-06 NOTE — Progress Notes (Signed)
[facility] Progress Note  2 Days Post-Op  Subjective: Abdominal pain along incision. Using PCA - discussed transition to PO pain medication with IV for breakthrough. Patient with less nausea today than yesterday, tol CLD with stoma output. Tolerated VAC change well this AM. Per RN patient was unable to void yesterday and had to be I&O cathed overnight. Discussed pathology with patient and oncology with touch base today.   Objective: Vital signs in last 24 hours: Temp:  [97.5 F (36.4 C)-97.8 F (36.6 C)] 97.5 F (36.4 C) (07/09 0557) Pulse Rate:  [49-56] 52 (07/09 0557) Resp:  [13-17] 13 (07/09 0737) BP: (109-123)/(70-78) 109/73 (07/09 0557) SpO2:  [97 %-100 %] 97 % (07/09 0737) Last BM Date: 04/06/20  Intake/Output from previous day: 07/08 0701 - 07/09 0700 In: 2927.1 [P.O.:780; I.V.:1050.8; IV Piggyback:1096.3] Out: 1055 [Urine:1025; Drains:30] Intake/Output this shift: No intake/output data recorded.  PE: General: pleasant, WD, WN white female who is laying in bed in NAD HEENT: Sclera are noninjected.  PERRL.  Ears and nose without any masses or lesions.  Mouth is pink and moist Heart: regular, rate, and rhythm. Palpable radial and pedal pulses bilaterally Lungs: CTAB, no wheezes, rhonchi, or rales noted.  Respiratory effort nonlabored Abd: soft, appropriately ttp, ND, +BS, drain with SS fluid, midline clean with minor bleeding, stoma viable with stool output    Lab Results:  Recent Labs    04/05/20 0458 04/06/20 0430  WBC 10.5 8.7  HGB 10.3* 9.3*  HCT 33.2* 29.8*  PLT 223 204   BMET Recent Labs    04/05/20 0458 04/06/20 0430  NA 142 141  K 3.6 3.3*  CL 106 108  CO2 19* 26  GLUCOSE 120* 117*  BUN 12 19  CREATININE 0.58 0.44  CALCIUM 8.0* 8.0*   PT/INR Recent Labs    04/04/20 0445  LABPROT 17.0*  INR 1.4*   CMP     Component Value Date/Time   NA 141 04/06/2020 0430   K 3.3 (L) 04/06/2020 0430   CL 108 04/06/2020 0430   CO2 26  04/06/2020 0430   GLUCOSE 117 (H) 04/06/2020 0430   BUN 19 04/06/2020 0430   CREATININE 0.44 04/06/2020 0430   CALCIUM 8.0 (L) 04/06/2020 0430   PROT 4.9 (L) 04/06/2020 0430   ALBUMIN 1.9 (L) 04/06/2020 0430   AST 14 (L) 04/06/2020 0430   ALT 8 04/06/2020 0430   ALKPHOS 39 04/06/2020 0430   BILITOT 0.5 04/06/2020 0430   GFRNONAA >60 04/06/2020 0430   GFRAA >60 04/06/2020 0430   Lipase     Component Value Date/Time   LIPASE 18 03/31/2020 0910       Studies/Results: DG CHEST PORT 1 VIEW  Result Date: 04/04/2020 CLINICAL DATA:  Preoperative evaluation for exploratory laparotomy EXAM: PORTABLE CHEST 1 VIEW COMPARISON:  03/31/2020 abdomen CT, 10/19/2012 chest x-ray FINDINGS: Lower lung volumes. Left lower lobe collapse/consolidation noted with an associated small left effusion. Difficult to exclude left lower lung pneumonia. Normal heart size and vascularity. Trachea midline. No acute osseous finding. IMPRESSION: Left lower lobe retrocardiac collapse/consolidation and small left effusion. Low lung volumes. Electronically Signed   By: Jerilynn Mages.  Shick M.D.   On: 04/04/2020 10:27   DG C-Arm 1-60 Min-No Report  Result Date: 04/04/2020 Fluoroscopy was utilized by the requesting physician.  No radiographic interpretation.    Anti-infectives: Anti-infectives (From admission, onward)   Start     Dose/Rate Route Frequency Ordered Stop   04/04/20 1045  cefoTEtan (CEFOTAN) 2 g  in sodium chloride 0.9 % 100 mL IVPB        2 g 200 mL/hr over 30 Minutes Intravenous On call to O.R. 04/03/20 1639 04/04/20 1213   03/31/20 2000  ceFEPIme (MAXIPIME) 2 g in sodium chloride 0.9 % 100 mL IVPB     Discontinue     2 g 200 mL/hr over 30 Minutes Intravenous Every 8 hours 03/31/20 1408     03/31/20 2000  metroNIDAZOLE (FLAGYL) IVPB 500 mg     Discontinue     500 mg 100 mL/hr over 60 Minutes Intravenous Every 8 hours 03/31/20 1410     03/31/20 1600  ceFEPIme (MAXIPIME) 2 g in sodium chloride 0.9 % 100 mL IVPB   Status:  Discontinued        2 g 200 mL/hr over 30 Minutes Intravenous  Once 03/31/20 1549 03/31/20 1551   03/31/20 1200  piperacillin-tazobactam (ZOSYN) IVPB 3.375 g        3.375 g 100 mL/hr over 30 Minutes Intravenous  Once 03/31/20 1159 03/31/20 1341       Assessment/Plan 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 [name]  - POD #2 - path significant for adenocarcinoma with 2/21 +LN - advance to FLD - d/c PCA and start transition to PO pain medication - continue VAC - mobilize as able Urinary retention - if bladder scan is >300, replace foley, start urecholine  FEN: IV fluids/FLD ID: Maxipime/Flagyl 7/3>>  DVT:  lovenox Follow up:  Dr. Barry Dienes   LOS: 6 days    Norm Parcel , [facility] 04/06/2020, 8:46 AM Please see Amion for pager number during day hours 7:00am-4:30pm

## 2020-04-07 DIAGNOSIS — C189 Malignant neoplasm of colon, unspecified: Secondary | ICD-10-CM

## 2020-04-07 DIAGNOSIS — A411 Sepsis due to other specified staphylococcus: Secondary | ICD-10-CM

## 2020-04-07 LAB — BASIC METABOLIC PANEL
Anion gap: 7 (ref 5–15)
BUN: 12 mg/dL (ref 6–20)
CO2: 25 mmol/L (ref 22–32)
Calcium: 7.4 mg/dL — ABNORMAL LOW (ref 8.9–10.3)
Chloride: 105 mmol/L (ref 98–111)
Creatinine, Ser: 0.44 mg/dL (ref 0.44–1.00)
GFR calc Af Amer: >60 mL/min (ref >60)
GFR calc non Af Amer: >60 mL/min (ref >60)
Glucose, Bld: 80 mg/dL (ref 70–99)
Potassium: 3.2 mmol/L — ABNORMAL LOW (ref 3.5–5.1)
Sodium: 137 mmol/L (ref 135–145)

## 2020-04-07 LAB — CBC WITH DIFFERENTIAL/PLATELET
Abs Immature Granulocytes: 0.11 10*3/uL — ABNORMAL HIGH (ref 0.00–0.07)
Basophils Absolute: 0 10*3/uL (ref 0.0–0.1)
Basophils Relative: 0 %
Eosinophils Absolute: 0.1 10*3/uL (ref 0.0–0.5)
Eosinophils Relative: 2 %
HCT: 28.7 % — ABNORMAL LOW (ref 36.0–46.0)
Hemoglobin: 8.9 g/dL — ABNORMAL LOW (ref 12.0–15.0)
Immature Granulocytes: 1 %
Lymphocytes Relative: 14 %
Lymphs Abs: 1.1 10*3/uL (ref 0.7–4.0)
MCH: 25.4 pg — ABNORMAL LOW (ref 26.0–34.0)
MCHC: 31 g/dL (ref 30.0–36.0)
MCV: 82 fL (ref 80.0–100.0)
Monocytes Absolute: 0.5 10*3/uL (ref 0.1–1.0)
Monocytes Relative: 6 %
Neutro Abs: 5.8 10*3/uL (ref 1.7–7.7)
Neutrophils Relative %: 77 %
Platelets: 215 10*3/uL (ref 150–400)
RBC: 3.5 MIL/uL — ABNORMAL LOW (ref 3.87–5.11)
RDW: 21.3 % — ABNORMAL HIGH (ref 11.5–15.5)
WBC: 7.6 10*3/uL (ref 4.0–10.5)
nRBC: 0 % (ref 0.0–0.2)

## 2020-04-07 LAB — MAGNESIUM: Magnesium: 1.9 mg/dL (ref 1.7–2.4)

## 2020-04-07 MED ORDER — ENSURE ENLIVE PO LIQD: 237.0000 mL | Freq: Two times a day (BID) | ORAL | Status: DC

## 2020-04-07 MED ORDER — VANCOMYCIN HCL 1500 MG/300ML IV SOLN
1500.0000 mg | INTRAVENOUS | Status: AC
  Administered 2020-04-07: 1500 mg via INTRAVENOUS
  Filled 2020-04-07: qty 300

## 2020-04-07 MED ORDER — VANCOMYCIN HCL 750 MG/150ML IV SOLN
750.0000 mg | Freq: Three times a day (TID) | INTRAVENOUS | Status: DC
  Administered 2020-04-08 (×2): 750 mg via INTRAVENOUS
  Filled 2020-04-07 (×3): qty 150

## 2020-04-07 MED ORDER — VANCOMYCIN HCL 1500 MG/300ML IV SOLN
1500.0000 mg | Freq: Once | INTRAVENOUS | Status: DC
  Filled 2020-04-07: qty 300

## 2020-04-07 MED ORDER — POTASSIUM CHLORIDE CRYS ER 20 MEQ PO TBCR: 40.0000 meq | EXTENDED_RELEASE_TABLET | Freq: Once | ORAL | Status: DC

## 2020-04-07 MED ORDER — POTASSIUM CHLORIDE CRYS ER 20 MEQ PO TBCR
40.0000 meq | EXTENDED_RELEASE_TABLET | ORAL | Status: AC
  Administered 2020-04-07 (×2): 40 meq via ORAL
  Filled 2020-04-07 (×2): qty 2

## 2020-04-07 NOTE — Progress Notes (Signed)
Pharmacy Antibiotic Note  Stephanie [name] is a 39 y.o. female admitted on 03/31/2020 with pelvic abscess. Patient has been on Cefepime and Metronidazole. ID adding Vancomycin for Staph epidermidis in abscess culture. Pharmacy has been consulted for Vancomycin dosing.  Plan: Vancomycin 1500mg  IV x 1, then 750mg  IV q8h Cefepime 2g IV q8h per MD Metronidazole 500mg  IV q8h per MD Monitor renal function, cultures, clinical course, ID recommendations  Height: 5\' 9"  (175.3 cm) Weight: 77.1 kg (170 lb) IBW/kg (Calculated) : 66.2  Temp (24hrs), Avg:98.2 F (36.8 C), Min:97.8 F (36.6 C), Max:98.8 F (37.1 C)  Recent Labs  Lab 04/03/20 0430 04/04/20 0445 04/05/20 0458 04/06/20 0430 04/07/20 0513  WBC 10.8* 9.8 10.5 8.7 7.6  CREATININE 0.52 0.60 0.58 0.44 0.44    Estimated Creatinine Clearance: 98.7 mL/min (by C-G formula based on SCr of 0.44 mg/dL).    No Known Allergies  Antimicrobials this admission: 7/3 Cefepime >> 7/3 Metronidazole >> 7/10 Vancomycin >>  Microbiology results: 7/3 BCx: NGF 7/6 surgical PCR: MRSA neg, Staph aureus neg 7/7 abdomen peritoneal abscess: rare Staph epidermidis (susceptibilities pending), no anaerobes isolated   Thank you for allowing pharmacy to be a part of this patient's care.   Lindell Spar, PharmD, BCPS Clinical Pharmacist  04/07/2020 4:31 PM

## 2020-04-07 NOTE — Evaluation (Signed)
Occupational Therapy Evaluation Patient Details Name: Stephanie [name] MRN: [id number] DOB: 03-06-1981 Today's Date: 04/07/2020    History of Present Illness Pt is a 39 year old woman admitted on 03/31/20 with sepsis secondary to sigmoid colon cancer with pelvic abscess and invasive adenocarcinoma. Underwent sigmoid colectomy and colostomy on 7/7. Hospital course complicated by urinary retention.  Pt was admitted 6/14- 6/18 with abdominal pain and abscess with + liver mass.    Clinical Impression   Pt was independent prior to admission. She reports she lives with her 13 year old daughter, but her mother also lives nearby. Pt presents with abdominal pain, generalized weakness, nausea and mild imbalance. Pt fatigues easily. She reports she has been ambulating in hall with nursing staff. Will follow acutely for ADL training and to educate in energy conservation strategies.    Follow Up Recommendations  No OT follow up    Equipment Recommendations  3 in 1 bedside commode    Recommendations for Other Services       Precautions / Restrictions Precautions Precautions: Fall Precaution Comments: abdominal wound vac      Mobility Bed Mobility Overal bed mobility: Needs Assistance Bed Mobility: Rolling;Sidelying to Sit;Sit to Sidelying Rolling: Min guard Sidelying to sit: Min assist     Sit to sidelying: Min assist General bed mobility comments: educated in log roll technique to minimize pain  Transfers Overall transfer level: Needs assistance Equipment used: 1 person hand held assist Transfers: Sit to/from Stand Sit to Stand: Min assist         General transfer comment: assist to rise and steady    Balance                                           ADL either performed or assessed with clinical judgement   ADL Overall ADL's : Needs assistance/impaired Eating/Feeding: Independent   Grooming: Set up;Sitting   Upper Body Bathing: Minimal  assistance;Sitting   Lower Body Bathing: Minimal assistance;Sit to/from stand   Upper Body Dressing : Minimal assistance;Sitting   Lower Body Dressing: Minimal assistance;Sit to/from stand Lower Body Dressing Details (indicate cue type and reason): pt able to cross foot over opposite knee Toilet Transfer: Minimal assistance;Ambulation           Functional mobility during ADLs: Minimal assistance General ADL Comments: Educated pt in splinting abdomen with pillow to minimize pain.     Vision Patient Visual Report: No change from baseline       Perception     Praxis      Pertinent Vitals/Pain Pain Assessment: Faces Faces Pain Scale: Hurts even more Pain Location: abdomen Pain Descriptors / Indicators: Sharp Pain Intervention(s): Repositioned;Patient requesting pain meds-RN notified     Hand Dominance Right   Extremity/Trunk Assessment Upper Extremity Assessment Upper Extremity Assessment: Overall WFL for tasks assessed   Lower Extremity Assessment Lower Extremity Assessment: Defer to PT evaluation   Cervical / Trunk Assessment Cervical / Trunk Assessment: Other exceptions Cervical / Trunk Exceptions: abdominal pain/incision/colostomy   Communication Communication Communication: No difficulties   Cognition Arousal/Alertness: Awake/alert Behavior During Therapy: Flat affect Overall Cognitive Status: Within Functional Limits for tasks assessed                                     General Comments  Exercises     Shoulder Instructions      Home Living Family/patient expects to be discharged to:: Private residence Living Arrangements: Children (9 year old daughter) Available Help at Discharge: Family;Available PRN/intermittently Type of Home: House Home Access: Stairs to enter [employer] of Steps: 2 Entrance Stairs-Rails: Right Home Layout: One level     Bathroom Shower/Tub: Teacher, early years/pre:  Standard     Home Equipment: None          Prior Functioning/Environment Level of Independence: Independent        Comments: pt was a hair stylist formerly, 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        OT Problem List: Decreased strength;Decreased activity tolerance;Impaired balance (sitting and/or standing);Decreased knowledge of use of DME or AE;Pain      OT Treatment/Interventions: Self-care/ADL training;Energy conservation;DME and/or AE instruction;Patient/family education;Therapeutic activities    OT Goals(Current goals can be found in the care plan section) Acute Rehab OT Goals Patient Stated Goal: return home OT Goal Formulation: With patient Time For Goal Achievement: 04/21/20 Potential to Achieve Goals: Good ADL Goals Pt Will Perform Grooming: with modified independence;standing Pt Will Perform Lower Body Bathing: with modified independence;sit to/from stand Pt Will Perform Lower Body Dressing: with modified independence;sit to/from stand Pt Will Transfer to Toilet: with modified independence;ambulating;bedside commode (over toilet) Pt Will Perform Toileting - Clothing Manipulation and hygiene: with modified independence;sit to/from stand Pt Will Perform Tub/Shower Transfer: Tub transfer;with supervision;ambulating;3 in 1 Additional ADL Goal #1: Pt will perform bed mobility using log roll technique.  OT Frequency: Min 2X/week   Barriers to D/C:            Co-evaluation              AM-PAC OT "6 Clicks" Daily Activity     Outcome Measure Help from another person eating meals?: None Help from another person taking care of personal grooming?: A Little Help from another person toileting, which includes using toliet, bedpan, or urinal?: A Little Help from another person bathing (including washing, rinsing, drying)?: A Little Help from another person to put on and taking off regular upper body clothing?: A Little Help from another person to put  on and taking off regular lower body clothing?: A Little 6 Click Score: 19   End of Session Nurse Communication: Other (comment);Patient requests pain meds (pt is lactose intolerant)  Activity Tolerance: Treatment limited secondary to medical complications (Comment);Patient limited by fatigue;Patient limited by pain (nausea) Patient left: in bed;with call bell/phone within reach  OT Visit Diagnosis: Unsteadiness on feet (R26.81);Pain;Muscle weakness (generalized) (M62.81)                Time: 4193-7902 OT Time Calculation (min): 25 min Charges:  OT General Charges $OT Visit: 1 Visit OT Evaluation $OT Eval Moderate Complexity: 1 Mod OT Treatments $Self Care/Home Management : 8-22 mins  Nestor Lewandowsky, OTR/L Acute Rehabilitation Services Pager: [phone] Office: [phone]  Malka So 04/07/2020, 2:09 PM

## 2020-04-07 NOTE — Progress Notes (Signed)
PROGRESS NOTE    Stephanie [name]  CZY:[id number] DOB: [birth year] DOA: 03/31/2020 PCP: Patient, [name]    Chief Complaint  Patient presents with  . Abdominal Pain    Brief Narrative:  Stephanie Kaufman Mittelmanis a 39 y.o.femalewith medical history significant ofrecent admission from June 14 to June 18 with abdominal pain and abscess with a question of sigmoidcolon malignancy versus diverticulitis. At that time she had an abnormal CT scan which also showed a liver mass. She had a barely elevated CEA at 4.9. She was placed on IV antibiotics, an attempt was made at liver biopsy however ultrasound suggested it might be a hemangioma. An MRI was equivocal. She was sent home on Augmentin which she completed and was doing well with until the last few days. She has continued to have blood in her stool. Stools are soft and not fully formed. She is passing gas. She has had increasing nausea with emesis over the last 12 hours. She reports ongoing lower abdominal cramping that is worse in the left lower quadrant. Her pain was 10 out of 10 requiring IV Dilaudid in the ED. She has an IUD in place, no sexual partner for the last 1 year. She denies vaginal discharge, vaginal bleeding, dysuria, hematuria, chest pain, or shortness of breath. In the ED she was sent for CT of the abdomen and pelvis which showed enlarging sigmoid colonic mass, which appeared which appears to be associated with focal perforation and has fistulized into the left adnexal region involving the left TOA. There are multiple enlarged lymph nodes which are bigger than before measuring up to 1.2 cm. The previously suspected metastatic lesion in the liver is also enlarged, small volume ascites. Patient placed empirically on IV antibiotics.  GI and general surgery were consulted.  Patient for exploratory laparotomy and partial colectomy with urethral stent placement today 04/04/2020 per general surgery. Patient with some complaints  of shortness of breath the morning of 04/04/2020, concern for volume overload and as such chest x-ray obtained and patient given a dose of IV Lasix, IV fluids discontinued.   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 There are multiple enlarged lymph nodes which are bigger than before measuring up to 1.2 cm. The previously suspected metastatic lesion in the liver is also enlarged, small volume ascites. Due to the location of the lesion percutaneous approach was not possible when Dr. Avon Gully discussed with IR or GI. General surgery consulted and had recommended IV antibiotics which patient was started on and patient currently on cefepime plus IV Flagyl for intra-abdominal abscess with presumed malignancy.  CEA at 5.6.  Patient status post open sigmoid colectomy and end colostomy per Dr. Barry Dienes, general surgery with findings concerning for sigmoid colon cancer with pelvic abscess.  Biopsies  consistent with invasive moderately differentiated adenocarcinoma, 6 cm, involving rectosigmoid junction. Carcinoma invades into serosal surface with perforation and associated serositis. Radial section margin is  positive for carcinoma: Proximal and distal margins are not involved. Lymphovascular invasion is present. Metastatic carcinoma to 1 of 15 lymph nodes, one tumor deposit.  Oncology consulted and patient will be set  up for outpatient follow-up for further evaluation and management.  Patient with a normal white count.  Due to recent diagnosis of cancer and pelvic abscess we will have ID evaluate for antibiotic recommendations and duration.  Continue current regimen of IV antibiotics.  Diet has been advanced to a full liquid diet.  Dilaudid PCA pump has been transitioned to IV Dilaudid.  Per general surgery.   2.  Sigmoid colon cancer with pelvic abscess, POA  Presumed on admission secondary to enlarging mass noted on CT with enlarging lymph nodes and enlarging liver mass.  Patient was seen by general surgery and underwent exploratory laparotomy with findings of sigmoid colon cancer with pelvic abscess, status post open sigmoid colectomy and end colostomy per Dr. Barry Dienes, general surgery (04/04/2020). Pathology consistent with invasive moderately differentiated adenocarcinoma, 6 cm, involving rectosigmoid junction. Carcinoma invades into serosal surface with perforation and associated serositis. Radial section margin is positive for carcinoma: Proximal and distal margins are not involved. Lymphovascular invasion is present. Metastatic carcinoma to 1 of 15 lymph nodes, one tumor deposit.  Oncology consulted and patient has been set up for outpatient follow-up with oncology.  Due to concern for pelvic abscess we will have ID way in on antibiotic recommendations and duration.  Per general surgery.   3.  Shortness of breath Secondary to volume overload.  Patient with complaints of shortness of breath early on in the hospitalization, when laying in the supine position feels likely secondary to abdominal swelling and swelling all over on 04/04/2020.[name]  Patient denies any chest pain.  Patient noted to be 12 L positive from Is/Os  however urine output not properly recorded.  Patient's weight on 04/04/2020 was 88.1 kg from 77.1 kg on admission.  Chest x-ray which was done showed left lower lobe retrocardiac collapse/consolidation and small  left effusion.  Patient given a dose of Lasix 40 mg IV x1 (04/04/2020) with a urine output of 3.175 L. Clinical improvement. Patient on gentle hydration.  Doubt if patient has a pneumonia however patient empirically on IV antibiotics.  Continue strict I's and O's, daily weights.  Follow.   4.  Nausea/vomiting/abdominal pain.   Nausea and vomiting have improved since admission.  Patient status post open sigmoid colectomy and end colostomy per general surgery 04/04/2020 with findings concerning for sigmoid colon cancer with pelvic abscess.  Biopsies consistent with invasive moderately differentiated adenocarcinoma, 6 cm, involving rectosigmoid junction.  Patient has been transitioned off Dilaudid PCA pump and currently on IV Dilaudid as needed.  See #1 and 2.  Supportive care.    5.  Acute blood loss anemia/chronic iron deficiency anemia Patient noted to have some scant bright red blood per rectum noted on previous exam is with recent bowel movement.  Hemoglobin currently at 8.9.  Follow H&H.  Transfusion threshold hemoglobin < 7.  Patient seen by GI who feel findings worrisome for colon cancer, colonoscopy currently high risk in the setting of adnexal abscess involving the small bowel and recommending consideration for colonoscopy in 6 to 8 weeks for tissue diagnosis if patient does not require surgery prior to that time.  Patient underwent surgery on 04/04/2020 with biopsies consistent with adenocarcinoma.  Oncology consulted and patient will follow up with oncology in the outpatient setting.  Outpatient follow-up with GI.  6.  Moderate  protein calorie malnutrition In the setting of above.  Patient with intractable nausea vomiting and abdominal pain on presentation which has since improved..  Status post open sigmoid colectomy and end colostomy.  Patient tolerating clear liquids.  Diet has been advanced to a full liquid diet.  Will place on Ensure.  7.  Hypokalemia Likely secondary to GI losses. Potassium at  3.2. K. Dur 40 mEq p.o.  Every 4 hours x2 doses.. Repeat labs in the morning.  8. Urinary retention Foley catheter discontinued per general surgery on 04/05/2020 however patient noted to have urinary retention had to undergo I and O cath.  Urine output of 1.225 L over the past 24 hours.  Foley catheter placed back in.  Patient started on Urecholine per general surgery.  DVT prophylaxis: SCDs Code Status: Full Family Communication: Updated patient.  No family at bedside. Disposition:   Status is: Inpatient    Dispo: The patient is from: Home              Anticipated d/c is to: Home              Anticipated d/c date is:      To be determined.          Patient currently postop day 1 with open sigmoid colectomy and end colostomy, on PCA pain pump, on empiric IV antibiotics, biopsies consistent with adenocarcinoma. Oncology consulted.  General surgery following.  Not stable for discharge.        Consultants:   General surgery: Dr. Leighton Ruff 0/03/3709  Gastroenterology: Dr. Tarri Glenn 04/01/2020  Wound care nurse Julien Girt, RN 04/04/2020  Oncology:  ID pending  Procedures:   CT abdomen and pelvis 03/31/2020  Open sigmoid colectomy and end colostomy per Dr. Barry Dienes 04/04/2020  Antimicrobials:   IV cefepime 03/31/2020>>>>  Preop IV Cefotan 04/05/2019 21x1 dose  IV Flagyl 03/31/2020>>>>>>>  IV Zosyn 04/01/2019 21x1 dose   Subjective: Patient in bed.  Complaining of some abdominal discomfort.  Passing flatus.  Some loose stool in ostomy bag.  Tolerating clears.  On IV Dilaudid.  Denies chest pain.  No shortness of breath.  Patient with some complaints of left upper quadrant discomfort.  Objective: Vitals:   04/06/20 1332 04/06/20 2141 04/07/20 0646 04/07/20 1301  BP: 123/82 118/75 127/76 123/77  Pulse: 68 64 76 73  Resp: 17 18 20 16   Temp: 98.1 F (36.7 C) 98 F (36.7 C) 97.8 F (36.6 C) 98.8 F (37.1 C)  TempSrc: Oral Oral Oral Oral  SpO2: 99% 100% 97% 98%  Weight:        Height:        Intake/Output Summary (Last 24 hours) at 04/07/2020 1308 Last data filed at 04/07/2020 1304 Gross per 24 hour  Intake 1778.21 ml  Output 1525 ml  Net 253.21 ml   Filed Weights   03/31/20 0850  Weight: 77.1 kg    Examination:  General exam: NAD Respiratory system: CTAB anterior lung fields.  No wheezes, no crackles, no rhonchi.  Fair air movement.  Speaking in full sentences.  Normal respiratory effort.  Cardiovascular system: Regular rate rhythm no murmurs rubs or gallops.  No JVD.  No lower extremity edema.  Gastrointestinal system: Abdomen is soft, some diffuse tenderness to palpation, positive bowel sounds, wound VAC over midline.  Left lower quadrant ostomy with small loose brown stool. JP drain with serosanguineous fluid.  Central nervous system: Alert and oriented. No focal neurological deficits. Extremities: Symmetric 5 x 5 power. Skin:  No rashes, lesions or ulcers Psychiatry: Judgement and insight appear normal. Mood & affect appropriate.     Data Reviewed: I have personally reviewed following labs and imaging studies  CBC: Recent Labs  Lab 04/03/20 0430 04/04/20 0445 04/05/20 0458 04/06/20 0430 04/07/20 0513  WBC 10.8* 9.8 10.5 8.7 7.6  NEUTROABS  --   --   --   --  5.8  HGB 10.0* 9.8* 10.3* 9.3* 8.9*  HCT 32.7* 31.6* 33.2* 29.8* 28.7*  MCV 82.8 82.9 81.8 81.0 82.0  PLT 174 172 223 204 664    Basic Metabolic Panel: Recent Labs  Lab 04/03/20 0430 04/04/20 0445 04/05/20 0458 04/06/20 0430 04/07/20 0513  NA 137 139 142 141 137  K 3.7 3.2* 3.6 3.3* 3.2*  CL 103 105 106 108 105  CO2 26 23 19* 26 25  GLUCOSE 95 76 120* 117* 80  BUN 10 10 12 19 12   CREATININE 0.52 0.60 0.58 0.44 0.44  CALCIUM 7.9* 7.9* 8.0* 8.0* 7.4*  MG  --  1.9  --  2.0 1.9    GFR: Estimated Creatinine Clearance: 98.7 mL/min (by C-G formula based on SCr of 0.44 mg/dL).  Liver Function Tests: Recent Labs  Lab 04/02/20 0605 04/03/20 0430 04/04/20 0445  04/05/20 0458 04/06/20 0430  AST 14* 12* 10* 11* 14*  ALT 13 10 8 8 8   ALKPHOS 45 50 53 47 39  BILITOT 0.5 0.8 0.9 1.0 0.5  PROT 5.3* 5.0* 5.3* 5.0* 4.9*  ALBUMIN 2.4* 2.2* 2.2* 1.9* 1.9*    CBG: No results for input(s): GLUCAP in the last 168 hours.   Recent Results (from the past 240 hour(s))  Blood culture (routine x 2)     Status: None   Collection Time: 03/31/20 12:52 PM   Specimen: BLOOD LEFT FOREARM  Result Value Ref Range Status   Specimen Description   Final    BLOOD LEFT FOREARM Performed at [facility], [place] [address] [place], [place] [address]    Special Requests   Final    BOTTLES DRAWN AEROBIC AND ANAEROBIC Blood Culture results may not be optimal due to an inadequate volume of blood received in culture bottles Performed at [place] [address] [place], [place] [address]    Culture   Final    NO GROWTH 5 DAYS Performed at [facility], [place] [address] [place], [place] [address]    Report Status 04/05/2020 FINAL  Final  Blood culture (routine x 2)     Status: None   Collection Time: 03/31/20 12:53 PM   Specimen: BLOOD  Result Value Ref Range Status   Specimen Description   Final    BLOOD LEFT WRIST Performed at [facility] [address] [place], [place] [address]    Special Requests   Final    BOTTLES DRAWN AEROBIC AND ANAEROBIC Blood Culture adequate volume Performed at [facility] [address] [place], [place] [address]    Culture   Final    NO GROWTH 5 DAYS Performed at [facility], [place] [address] [place], [place] [address]    Report Status 04/05/2020 FINAL  Final  SARS Coronavirus 2 by RT PCR (hospital order, performed in [facility] hospital lab) Nasopharyngeal Nasopharyngeal Swab     Status: None   Collection Time: 03/31/20 12:53 PM   Specimen: Nasopharyngeal Swab  Result Value Ref Range Status   SARS Coronavirus 2 NEGATIVE NEGATIVE Final  Comment: (NOTE) SARS-CoV-2 target nucleic acids are NOT DETECTED.  The SARS-CoV-2 RNA is generally detectable in upper and lower respiratory specimens during the acute phase of infection. The lowest concentration of SARS-CoV-2 viral copies this assay can detect is 250 copies / mL. A negative result does not preclude SARS-CoV-2 infection and should not be used as the sole basis for treatment or other patient management decisions.  A negative result may occur with improper specimen collection / handling, submission of specimen other than nasopharyngeal swab, presence of viral mutation(s) within the areas targeted by this assay, and inadequate number of viral copies (<250 copies / mL). A negative result must be combined with clinical observations, patient history, and epidemiological information.  Fact Sheet for Patients:   StrictlyIdeas.no  Fact Sheet for Healthcare Providers: BankingDealers.co.za  This test is not yet approved or  cleared by the [place] FDA and has been authorized for detection and/or diagnosis of SARS-CoV-2 by FDA under an Emergency Use Authorization (EUA).  This EUA will remain in effect (meaning this test can be used) for the duration of the COVID-19 declaration under Section 564(b)(1) of the Act, 21 U.S.C. section 360bbb-3(b)(1), unless the authorization is terminated or revoked sooner.  Performed at [facility], [place] [address] [place], [place] [address]   Surgical PCR screen     Status: None   Collection Time: 04/03/20  4:52 PM   Specimen: Nasal Mucosa; Nasal Swab  Result Value Ref Range Status   MRSA, PCR NEGATIVE NEGATIVE Final   Staphylococcus aureus NEGATIVE NEGATIVE Final    Comment: (NOTE) The Xpert SA Assay (FDA approved for NASAL specimens in patients 67 years of age and older), is one component of a comprehensive surveillance program. It is not intended to diagnose infection  nor to guide or monitor treatment. Performed at [facility], [place] [address], [place], [place] [address]   Aerobic/Anaerobic Culture (surgical/deep wound)     Status: None (Preliminary result)   Collection Time: 04/04/20 12:46 PM   Specimen: Abdomen; Abscess  Result Value Ref Range Status   Specimen Description   Final    ABDOMEN PERITONEAL ABSCESS Performed at [facility] [address], [place], [place] [address]    Special Requests   Final    NONE Performed at [facility], [place] [address] [place], [place] [address]    Gram Stain   Final    RARE WBC PRESENT, PREDOMINANTLY MONONUCLEAR NO ORGANISMS SEEN Performed at [facility], [place] [address] [place], [place] [address]    Culture   Final    RARE STAPHYLOCOCCUS EPIDERMIDIS SUSCEPTIBILITIES TO FOLLOW NO ANAEROBES ISOLATED; CULTURE IN PROGRESS FOR 5 DAYS    Report Status PENDING  Incomplete         Radiology Studies: No results found.      Scheduled Meds: . acetaminophen  1,000 mg Oral Q8H  . alvimopan  12 mg Oral BID  . bethanechol  10 mg Oral TID  . Chlorhexidine Gluconate Cloth  6 each Topical Daily  . enoxaparin (LOVENOX) injection  40 mg Subcutaneous Q24H  . famotidine  20 mg Oral BID  . ketorolac  15 mg Intravenous Q6H  . potassium chloride  40 mEq Oral Q4H   Continuous Infusions: . ceFEPime (MAXIPIME) IV 2 g (04/07/20 1301)  . lactated ringers with kcl 75 mL/hr at 04/07/20 1300  . metronidazole 500 mg (04/07/20 1301)     LOS: 7 days  Time spent: 35 minutes    Irine Seal, MD [facility]   To contact the attending provider between 7A-7P or the covering provider during after hours 7P-7A, please log into the web site [email or web address] and access using universal [place] password for that web site. If you do not have the password, please call the hospital operator.  04/07/2020, 1:08 PM

## 2020-04-07 NOTE — Progress Notes (Signed)
PT Cancellation Note  Patient Details Name: Stephanie [name] MRN: [id number] DOB: 04-10-1981   Cancelled Treatment:    Reason Eval/Treat Not Completed: Other (comment). Spoke with Rola today regarding mobility, she has been up walking with nursing staff, pushing IV pole--when asked if she felt she could amb without support of IV pole she stated she did not feel she would be able to do so.  Pt states she just hand episode of N/V and does not feel like getting up at this time. Will continue efforts to complete PT eval    [facility] 04/07/2020, 4:22 PM

## 2020-04-07 NOTE — Progress Notes (Signed)
3 Days Post-Op   Subjective/Chief Complaint: She reports still having moderate pain and having nausea with oral pain medications   Objective: Vital signs in last 24 hours: Temp:  [97.8 F (36.6 C)-98.1 F (36.7 C)] 97.8 F (36.6 C) (07/10 0646) Pulse Rate:  [64-76] 76 (07/10 0646) Resp:  [17-20] 20 (07/10 0646) BP: (118-127)/(75-82) 127/76 (07/10 0646) SpO2:  [97 %-100 %] 97 % (07/10 0646) Last BM Date: 04/06/20  Intake/Output from previous day: 07/09 0701 - 07/10 0700 In: 2038.2 [P.O.:360; I.V.:1096.4; IV Piggyback:581.8] Out: 1365 [Urine:1225; Drains:90; Stool:50] Intake/Output this shift: No intake/output data recorded.  Exam: Awake and alert Appears uncomfortable Abdomen is soft.  Drain is serosanguineous.  Wound VAC is in place.  Ostomy is pink.  There is no gas or stool in the bag  Lab Results:  Recent Labs    04/06/20 0430 04/07/20 0513  WBC 8.7 7.6  HGB 9.3* 8.9*  HCT 29.8* 28.7*  PLT 204 215   BMET Recent Labs    04/06/20 0430 04/07/20 0513  NA 141 137  K 3.3* 3.2*  CL 108 105  CO2 26 25  GLUCOSE 117* 80  BUN 19 12  CREATININE 0.44 0.44  CALCIUM 8.0* 7.4*   PT/INR No results for input(s): LABPROT, INR in the last 72 hours. ABG No results for input(s): PHART, HCO3 in the last 72 hours.  Invalid input(s): PCO2, PO2  Studies/Results: No results found.  Anti-infectives: Anti-infectives (From admission, onward)   Start     Dose/Rate Route Frequency Ordered Stop   04/04/20 1045  cefoTEtan (CEFOTAN) 2 g in sodium chloride 0.9 % 100 mL IVPB        2 g 200 mL/hr over 30 Minutes Intravenous On call to O.R. 04/03/20 1639 04/04/20 1213   03/31/20 2000  ceFEPIme (MAXIPIME) 2 g in sodium chloride 0.9 % 100 mL IVPB     Discontinue     2 g 200 mL/hr over 30 Minutes Intravenous Every 8 hours 03/31/20 1408     03/31/20 2000  metroNIDAZOLE (FLAGYL) IVPB 500 mg     Discontinue     500 mg 100 mL/hr over 60 Minutes Intravenous Every 8 hours 03/31/20 1410      03/31/20 1600  ceFEPIme (MAXIPIME) 2 g in sodium chloride 0.9 % 100 mL IVPB  Status:  Discontinued        2 g 200 mL/hr over 30 Minutes Intravenous  Once 03/31/20 1549 03/31/20 1551   03/31/20 1200  piperacillin-tazobactam (ZOSYN) IVPB 3.375 g        3.375 g 100 mL/hr over 30 Minutes Intravenous  Once 03/31/20 1159 03/31/20 1341      Assessment/Plan: s/p Procedure(s): EXPLORATORY LAPAROTOMY (N/A) CYSTOSCOPY WITH STENT PLACEMENT  Sigmoid colon cancer with pelvic abscess Open sigmoid colectomy and end colostomy(Hartman's procedure)04/04/2020, Dr. [name]   POD#3 Adjust pain meds Continue current care Wound VAC   LOS: 7 days    Stephanie [name] 04/07/2020

## 2020-04-07 NOTE — Consult Note (Signed)
[facility]       Reason for Consult: abscess    Referring Physician: Dr. Grandville Silos  Principal Problem:   Sepsis [facility]) Active Problems:   Microcytic anemia   Colorectal carcinoma ([place])   Peritonitis with abscess of intestine (HCC)   Intra-abdominal abscess (HCC)   SOB (shortness of breath)   Moderate protein-calorie malnutrition (HCC)   Nausea & vomiting   TOA (tubo-ovarian abscess)   . acetaminophen  1,000 mg Oral Q8H  . alvimopan  12 mg Oral BID  . bethanechol  10 mg Oral TID  . Chlorhexidine Gluconate Cloth  6 each Topical Daily  . enoxaparin (LOVENOX) injection  40 mg Subcutaneous Q24H  . famotidine  20 mg Oral BID  . feeding supplement (ENSURE ENLIVE)  237 mL Oral BID BM  . ketorolac  15 mg Intravenous Q6H    Recommendations: vancomcyin pending the sensitivities Continue with cefepime and flagyl for now  Assessment: She has a pelvic abscess with sigmoid colon cancer and operative culture with Staph epidermidis.  May be polymicrobial but will target the Staph along with GNR/anaerobes base on Staph sensitivities.    Antibiotics: Cefepime and flagyl  HPI: Stephanie [name] is a 39 y.o. 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  She was noted also to have a pelvic abscess and was previously being treated with Augmentin.  Culture from the abscess is growing Staph epidermidis with sensitivities pending. She has been on cefepime and flagyl for the abscess.      Review of Systems:  Constitutional: negative for fevers and chills Integument/breast: negative for rash All other systems reviewed and are negative    Past Medical History:  Diagnosis Date  . BV (bacterial vaginosis)   . Lactose intolerance 03/12/2020  . UTI (lower urinary tract infection)     Social History   Tobacco Use  . Smoking status: Former Smoker    Packs/day:  0.50    Years: 10.00    Pack years: 5.00    Types: Cigarettes  . Smokeless tobacco: Never Used  Vaping Use  . Vaping Use: Never used  Substance Use Topics  . Alcohol use: Yes    Comment: seldom  . Drug use: No    Family History  Problem Relation Age of Onset  . Heart disease Father   . Colon cancer Neg Hx   . Esophageal cancer Neg Hx     No Known Allergies  Physical Exam: Constitutional: in no apparent distress  Vitals:   04/07/20 0646 04/07/20 1301  BP: 127/76 123/77  Pulse: 76 73  Resp: 20 16  Temp: 97.8 F (36.6 C) 98.8 F (37.1 C)  SpO2: 97% 98%   EYES: anicteric Cardiovascular: Cor RRR Respiratory: clear; Musculoskeletal: no pedal edema noted Skin: negatives: no rash  Lab Results  Component Value Date   WBC 7.6 04/07/2020   HGB 8.9 (L) 04/07/2020   HCT 28.7 (L) 04/07/2020   MCV 82.0 04/07/2020   PLT 215 04/07/2020    Lab Results  Component Value Date   CREATININE 0.44 04/07/2020   BUN 12 04/07/2020   NA 137 04/07/2020   K 3.2 (L) 04/07/2020   CL 105 04/07/2020   CO2 25 04/07/2020    Lab Results  Component Value Date   ALT 8 04/06/2020   AST 14 (L) 04/06/2020   ALKPHOS 39 04/06/2020     Microbiology: Recent Results (from  the past 240 hour(s))  Blood culture (routine x 2)     Status: None   Collection Time: 03/31/20 12:52 PM   Specimen: BLOOD LEFT FOREARM  Result Value Ref Range Status   Specimen Description   Final    BLOOD LEFT FOREARM Performed at [facility], [place] [address], [place], [place] [address]    Special Requests   Final    BOTTLES DRAWN AEROBIC AND ANAEROBIC Blood Culture results may not be optimal due to an inadequate volume of blood received in culture bottles Performed at [place] [facility] [address], [place], [place] [address]    Culture   Final    NO GROWTH 5 DAYS Performed at [facility], [place] [address] [place], [place] [address]    Report Status 04/05/2020 FINAL  Final  Blood  culture (routine x 2)     Status: None   Collection Time: 03/31/20 12:53 PM   Specimen: BLOOD  Result Value Ref Range Status   Specimen Description   Final    BLOOD LEFT WRIST Performed at [place] [address] [place], [place] [address]    Special Requests   Final    BOTTLES DRAWN AEROBIC AND ANAEROBIC Blood Culture adequate volume Performed at [place] [address], [place], [place] [address]    Culture   Final    NO GROWTH 5 DAYS Performed at [facility], [place] [address] [place], [place] [address]    Report Status 04/05/2020 FINAL  Final  SARS Coronavirus 2 by RT PCR (hospital order, performed in [facility] hospital lab) Nasopharyngeal Nasopharyngeal Swab     Status: None   Collection Time: 03/31/20 12:53 PM   Specimen: Nasopharyngeal Swab  Result Value Ref Range Status   SARS Coronavirus 2 NEGATIVE NEGATIVE Final    Comment: (NOTE) SARS-CoV-2 target nucleic acids are NOT DETECTED.  The SARS-CoV-2 RNA is generally detectable in upper and lower respiratory specimens during the acute phase of infection. The lowest concentration of SARS-CoV-2 viral copies this assay can detect is 250 copies / mL. A negative result does not preclude SARS-CoV-2 infection and should not be used as the sole basis for treatment or other patient management decisions.  A negative result may occur with improper specimen collection / handling, submission of specimen other than nasopharyngeal swab, presence of viral mutation(s) within the areas targeted by this assay, and inadequate number of viral copies (<250 copies / mL). A negative result must be combined with clinical observations, patient history, and epidemiological information.  Fact Sheet for Patients:   StrictlyIdeas.no  Fact Sheet for Healthcare Providers: BankingDealers.co.za  This test is not yet approved or  cleared by the [place] FDA and has been authorized for detection and/or diagnosis of SARS-CoV-2 by FDA under an Emergency Use Authorization (EUA).  This EUA will remain in effect (meaning this test can be used) for the duration of the COVID-19 declaration under Section 564(b)(1) of the Act, 21 U.S.C. section 360bbb-3(b)(1), unless the authorization is terminated or revoked sooner.  Performed at [facility], [place] [employer] [address], [place], [place] [address]   Surgical PCR screen     Status: None   Collection Time: 04/03/20  4:52 PM   Specimen: Nasal Mucosa; Nasal Swab  Result Value Ref Range Status   MRSA, PCR NEGATIVE NEGATIVE Final   Staphylococcus aureus NEGATIVE NEGATIVE Final    Comment: (NOTE) The Xpert SA Assay (  FDA approved for NASAL specimens in patients 35 years of age and older), is one component of a comprehensive surveillance program. It is not intended to diagnose infection nor to guide or monitor treatment. Performed at [facility], [place] [address], [place], [place] [address]   Aerobic/Anaerobic Culture (surgical/deep wound)     Status: None (Preliminary result)   Collection Time: 04/04/20 12:46 PM   Specimen: Abdomen; Abscess  Result Value Ref Range Status   Specimen Description   Final    ABDOMEN PERITONEAL ABSCESS Performed at [place] [address] [place], [place] [address]    Special Requests   Final    NONE Performed at [facility], [place] [address], [place], [place] [address]    Gram Stain   Final    RARE WBC PRESENT, PREDOMINANTLY MONONUCLEAR NO ORGANISMS SEEN Performed at [facility], [place] [address], [place],  [address]    Culture   Final    RARE STAPHYLOCOCCUS EPIDERMIDIS SUSCEPTIBILITIES TO FOLLOW NO ANAEROBES ISOLATED; CULTURE IN PROGRESS FOR 5 DAYS    Report Status PENDING  Incomplete    Thayer Headings, [email or web address] 04/07/2020, 3:29 PM

## 2020-04-08 LAB — CBC
HCT: 30.1 % — ABNORMAL LOW (ref 36.0–46.0)
Hemoglobin: 9.5 g/dL — ABNORMAL LOW (ref 12.0–15.0)
MCH: 25.4 pg — ABNORMAL LOW (ref 26.0–34.0)
MCHC: 31.6 g/dL (ref 30.0–36.0)
MCV: 80.5 fL (ref 80.0–100.0)
Platelets: 240 10*3/uL (ref 150–400)
RBC: 3.74 MIL/uL — ABNORMAL LOW (ref 3.87–5.11)
RDW: 20.9 % — ABNORMAL HIGH (ref 11.5–15.5)
WBC: 13.5 10*3/uL — ABNORMAL HIGH (ref 4.0–10.5)
nRBC: 0 % (ref 0.0–0.2)

## 2020-04-08 LAB — BASIC METABOLIC PANEL
Anion gap: 11 (ref 5–15)
BUN: 6 mg/dL (ref 6–20)
CO2: 22 mmol/L (ref 22–32)
Calcium: 7.5 mg/dL — ABNORMAL LOW (ref 8.9–10.3)
Chloride: 102 mmol/L (ref 98–111)
Creatinine, Ser: 0.44 mg/dL (ref 0.44–1.00)
GFR calc Af Amer: >60 mL/min (ref >60)
GFR calc non Af Amer: >60 mL/min (ref >60)
Glucose, Bld: 73 mg/dL (ref 70–99)
Potassium: 4.1 mmol/L (ref 3.5–5.1)
Sodium: 135 mmol/L (ref 135–145)

## 2020-04-08 MED ORDER — VANCOMYCIN HCL IN DEXTROSE 1-5 GM/200ML-% IV SOLN
1000.0000 mg | Freq: Three times a day (TID) | INTRAVENOUS | Status: DC
  Administered 2020-04-08: 1000 mg via INTRAVENOUS
  Filled 2020-04-08: qty 200

## 2020-04-08 NOTE — Progress Notes (Signed)
Patient did not drink Ensure nutrition due to lactose intolerance.

## 2020-04-08 NOTE — Progress Notes (Signed)
PROGRESS NOTE    Stephanie [name]  BPZ:[id number] DOB: [birth year] DOA: 03/31/2020 PCP: Patient, [name]    Chief Complaint  Patient presents with   Abdominal Pain    Brief Narrative:  Stephanie Mckeag Mittelmanis a 39 y.o.femalewith medical history significant ofrecent admission from June 14 to June 18 with abdominal pain and abscess with a question of sigmoidcolon malignancy versus diverticulitis. At that time she had an abnormal CT scan which also showed a liver mass. She had a barely elevated CEA at 4.9. She was placed on IV antibiotics, an attempt was made at liver biopsy however ultrasound suggested it might be a hemangioma. An MRI was equivocal. She was sent home on Augmentin which she completed and was doing well with until the last few days. She has continued to have blood in her stool. Stools are soft and not fully formed. She is passing gas. She has had increasing nausea with emesis over the last 12 hours. She reports ongoing lower abdominal cramping that is worse in the left lower quadrant. Her pain was 10 out of 10 requiring IV Dilaudid in the ED. She has an IUD in place, no sexual partner for the last 1 year. She denies vaginal discharge, vaginal bleeding, dysuria, hematuria, chest pain, or shortness of breath. In the ED she was sent for CT of the abdomen and pelvis which showed enlarging sigmoid colonic mass, which appeared which appears to be associated with focal perforation and has fistulized into the left adnexal region involving the left TOA. There are multiple enlarged lymph nodes which are bigger than before measuring up to 1.2 cm. The previously suspected metastatic lesion in the liver is also enlarged, small volume ascites. Patient placed empirically on IV antibiotics.  GI and general surgery were consulted.  Patient for exploratory laparotomy and partial colectomy with urethral stent placement today 04/04/2020 per general surgery. Patient with some complaints  of shortness of breath the morning of 04/04/2020, concern for volume overload and as such chest x-ray obtained and patient given a dose of IV Lasix, IV fluids discontinued.   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 There are multiple enlarged lymph nodes which are bigger than before measuring up to 1.2 cm. The previously suspected metastatic lesion in the liver is also enlarged, small volume ascites. Due to the location of the lesion percutaneous approach was not possible when Dr. Avon Gully discussed with IR or GI. General surgery consulted and had recommended IV antibiotics which patient was started on and patient currently on IV cefepime, IV Flagyl, IV vancomycin for intra-abdominal abscess with presumed malignancy.  IV vancomycin added yesterday per ID recommendations, as abscess cultures preliminary readings for staph epidermis.  CEA at 5.6.  Patient status post open sigmoid colectomy and end colostomy per Dr. Barry Dienes, general surgery with findings concerning for sigmoid colon cancer with pelvic abscess.  Biopsies  consistent with invasive moderately differentiated adenocarcinoma, 6  cm, involving rectosigmoid junction. Carcinoma invades into serosal surface with perforation and associated serositis. Radial section margin is positive for carcinoma: Proximal and distal margins are not involved.  Lymphovascular invasion is present. Metastatic carcinoma to 1 of 15 lymph nodes, one tumor deposit.  Oncology consulted and patient will be set up for outpatient follow-up for further evaluation and management.  Patient with cyst trending back up.  Due to recent diagnosis of cancer and pelvic abscess, ID was consulted and IV vancomycin added to IV cefepime and Flagyl. Diet has been advanced to a full liquid diet which patient is tolerating.  Dilaudid PCA pump has been transitioned to IV Dilaudid.  Per general surgery.   2.  Sigmoid colon cancer with pelvic abscess, POA  Presumed on admission secondary to enlarging mass noted on CT with enlarging lymph nodes and enlarging liver mass.  Patient was seen by general surgery and underwent exploratory laparotomy with findings of sigmoid colon cancer with pelvic abscess, status post open sigmoid colectomy and end colostomy per Dr. Barry Dienes, general surgery (04/04/2020). Pathology consistent with invasive moderately differentiated adenocarcinoma, 6 cm, involving rectosigmoid junction. Carcinoma invades into serosal surface with perforation and associated serositis. Radial section margin is positive for carcinoma: Proximal and distal margins are not involved. Lymphovascular invasion is present. Metastatic carcinoma to 1 of 15 lymph nodes, one tumor deposit.  Oncology consulted and patient has been set up for outpatient follow-up with oncology.  Due to concern for pelvic abscess ID was consulted and abscess cultures positive for staph epidermidis.  Patient started on IV vancomycin per ID in addition to IV cefepime and IV Flagyl.  ID and general surgery following and appreciate input and recommendations.   3.  Shortness of breath Secondary to volume overload.  Patient with complaints of shortness of breath early on in the hospitalization, when laying in the supine position feels likely secondary to abdominal swelling and swelling all over on 04/04/2020.[name]  Patient denies any  chest pain.  Patient noted to be 12 L positive from Is/Os  however urine output not properly recorded.  Patient's weight on 04/04/2020 was 88.1 kg from 77.1 kg on admission.  Chest x-ray which was done showed left lower lobe retrocardiac collapse/consolidation and small left effusion.  Patient given a dose of Lasix 40 mg IV x1 (04/04/2020) with a urine output of 3.175 L. Clinical improvement. Patient on gentle hydration.  Doubt if patient has a pneumonia however patient empirically on IV antibiotics.  Continue strict I's and O's, daily weights.  Follow.   4.  Nausea/vomiting/abdominal pain.   Nausea and vomiting have improved since admission.  Patient status post open sigmoid colectomy and end colostomy per general surgery 04/04/2020 with findings concerning for sigmoid colon cancer with pelvic abscess.  Biopsies consistent with invasive moderately differentiated adenocarcinoma, 6 cm, involving rectosigmoid junction.  Patient has been transitioned off Dilaudid PCA pump and currently on IV Dilaudid as needed.  See #1 and 2.  Supportive care.    5.  Acute blood loss anemia/chronic iron deficiency anemia Patient noted to have some scant bright red blood per rectum noted on previous exam.  Hemoglobin currently at 9.5.  Follow H&H.  Transfusion threshold hemoglobin < 7.  Patient seen by GI who feel findings worrisome for colon cancer, colonoscopy currently high risk in the setting of adnexal abscess involving the small bowel and recommending consideration for colonoscopy in 6 to 8 weeks for tissue diagnosis if patient does not require surgery prior to that time.  Patient underwent surgery on 04/04/2020 with biopsies consistent with adenocarcinoma.  Oncology consulted and patient will follow up with oncology in the outpatient setting.  Outpatient follow-up with GI.  6.  Moderate protein calorie malnutrition In the setting of above.  Patient with intractable nausea vomiting and abdominal pain on presentation which has  since improved. Status post open sigmoid colectomy and end colostomy.  Patient tolerating full liquid diet.  Nutritional supplementation.   7.  Hypokalemia Likely secondary to GI losses.  Repleted.  Potassium at 4.1.  Follow.   8. Urinary retention Foley catheter discontinued per general surgery on 04/05/2020 however patient noted to have urinary retention had to undergo I and O cath.  Urine output of 3.6 L over the past 24 hours.  Foley catheter placed back in.  Continue Urecholine.   DVT prophylaxis: SCDs Code Status: Full Family Communication: Updated patient.  No family at bedside. Disposition:   Status is: Inpatient    Dispo: The patient is from: Home              Anticipated d/c is to: Home              Anticipated d/c date is:      To be determined.          Patient currently postop with open sigmoid colectomy and end colostomy, on PCA pain pump, on empiric IV antibiotics, biopsies consistent with adenocarcinoma. Oncology consulted.  ID consulted.  General surgery following.  Not stable for discharge.        Consultants:   General surgery: Dr. Leighton Ruff 11/05/9483  Gastroenterology: Dr. Tarri Glenn 04/01/2020  Wound care nurse Julien Girt, RN 04/04/2020  Oncology:  ID: Dr.Comer 04/07/2020  Procedures:   CT abdomen and pelvis 03/31/2020  Open sigmoid colectomy and end colostomy per Dr. Barry Dienes 04/04/2020  Antimicrobials:   IV cefepime 03/31/2020>>>>  Preop IV Cefotan 04/05/2019 21x1 dose  IV Flagyl 03/31/2020>>>>>>>  IV Zosyn 04/01/2019 21x1 dose  IV vancomycin 04/07/2020   Subjective: Patient laying in bed.  Complaining of bad dreams/nightmares.  Denies any chest pain.  No shortness of breath.  Still with some abdominal discomfort.  Bout of nausea and emesis yesterday.  Ostomy with stool noted.  Ambulated in the hallway yesterday with nurse tech per patient but was slow.   Objective: Vitals:   04/07/20 1301 04/07/20 2102 04/08/20 0528 04/08/20 0627  BP: 123/77 121/70   116/71  Pulse: 73 64  72  Resp: 16 16  18   Temp: 98.8 F (37.1 C) 97.7 F (36.5 C)  98.6 F (37 C)  TempSrc: Oral Oral  Oral  SpO2: 98% 94%  97%  Weight:   85.9 kg   Height:        Intake/Output Summary (Last 24 hours) at 04/08/2020 1057 Last data filed at 04/08/2020 0639 Gross per 24 hour  Intake 1095.07 ml  Output 4650 ml  Net -3554.93 ml   Filed Weights   03/31/20 0850 04/08/20 0528  Weight: 77.1 kg 85.9 kg    Examination:  General exam: NAD Respiratory system: Lungs clear to auscultation bilaterally.  No wheezes, no crackles, no rhonchi.  Normal air movement.  Speaking in full sentences. Cardiovascular system: RRR no murmurs rubs or gallops.  No JVD.  No lower extremity edema.   Gastrointestinal system: Abdomen is soft, decreased diffuse tenderness to palpation, wound VAC over midline.  Left quadrant ostomy with stool noted.  JP drain with serosanguineous fluid.  Central nervous system: Alert and  oriented. No focal neurological deficits. Extremities: Symmetric 5 x 5 power. Skin: No rashes, lesions or ulcers Psychiatry: Judgement and insight appear normal. Mood & affect appropriate.     Data Reviewed: I have personally reviewed following labs and imaging studies  CBC: Recent Labs  Lab 04/04/20 0445 04/05/20 0458 04/06/20 0430 04/07/20 0513 04/08/20 0513  WBC 9.8 10.5 8.7 7.6 13.5*  NEUTROABS  --   --   --  5.8  --   HGB 9.8* 10.3* 9.3* 8.9* 9.5*  HCT 31.6* 33.2* 29.8* 28.7* 30.1*  MCV 82.9 81.8 81.0 82.0 80.5  PLT 172 223 204 215 382    Basic Metabolic Panel: Recent Labs  Lab 04/04/20 0445 04/05/20 0458 04/06/20 0430 04/07/20 0513 04/08/20 0513  NA 139 142 141 137 135  K 3.2* 3.6 3.3* 3.2* 4.1  CL 105 106 108 105 102  CO2 23 19* 26 25 22   GLUCOSE 76 120* 117* 80 73  BUN 10 12 19 12 6   CREATININE 0.60 0.58 0.44 0.44 0.44  CALCIUM 7.9* 8.0* 8.0* 7.4* 7.5*  MG 1.9  --  2.0 1.9  --     GFR: Estimated Creatinine Clearance: 110.4 mL/min (by C-G  formula based on SCr of 0.44 mg/dL).  Liver Function Tests: Recent Labs  Lab 04/02/20 0605 04/03/20 0430 04/04/20 0445 04/05/20 0458 04/06/20 0430  AST 14* 12* 10* 11* 14*  ALT 13 10 8 8 8   ALKPHOS 45 50 53 47 39  BILITOT 0.5 0.8 0.9 1.0 0.5  PROT 5.3* 5.0* 5.3* 5.0* 4.9*  ALBUMIN 2.4* 2.2* 2.2* 1.9* 1.9*    CBG: No results for input(s): GLUCAP in the last 168 hours.   Recent Results (from the past 240 hour(s))  Blood culture (routine x 2)     Status: None   Collection Time: 03/31/20 12:52 PM   Specimen: BLOOD LEFT FOREARM  Result Value Ref Range Status   Specimen Description   Final    BLOOD LEFT FOREARM Performed at [facility], [place] [address] [place], [place] [address]    Special Requests   Final    BOTTLES DRAWN AEROBIC AND ANAEROBIC Blood Culture results may not be optimal due to an inadequate volume of blood received in culture bottles Performed at [facility] [address], [place], [place] [address]    Culture   Final    NO GROWTH 5 DAYS Performed at [facility], [place] [address], [place], [place] [address]    Report Status 04/05/2020 FINAL  Final  Blood culture (routine x 2)     Status: None   Collection Time: 03/31/20 12:53 PM   Specimen: BLOOD  Result Value Ref Range Status   Specimen Description   Final    BLOOD LEFT WRIST Performed at [facility] [address], [place], [place] [address]    Special Requests   Final    BOTTLES DRAWN AEROBIC AND ANAEROBIC Blood Culture adequate volume Performed at [facility] [address], [place], [place] [address]    Culture   Final    NO GROWTH 5 DAYS Performed at [facility], [place] [address], [place], [place] [address]    Report Status 04/05/2020 FINAL  Final  SARS Coronavirus 2 by RT PCR (hospital order, performed in [facility] hospital lab) Nasopharyngeal Nasopharyngeal Swab     Status: None   Collection Time: 03/31/20  12:53 PM   Specimen: Nasopharyngeal Swab  Result  Value Ref Range Status   SARS Coronavirus 2 NEGATIVE NEGATIVE Final    Comment: (NOTE) SARS-CoV-2 target nucleic acids are NOT DETECTED.  The SARS-CoV-2 RNA is generally detectable in upper and lower respiratory specimens during the acute phase of infection. The lowest concentration of SARS-CoV-2 viral copies this assay can detect is 250 copies / mL. A negative result does not preclude SARS-CoV-2 infection and should not be used as the sole basis for treatment or other patient management decisions.  A negative result may occur with improper specimen collection / handling, submission of specimen other than nasopharyngeal swab, presence of viral mutation(s) within the areas targeted by this assay, and inadequate number of viral copies (<250 copies / mL). A negative result must be combined with clinical observations, patient history, and epidemiological information.  Fact Sheet for Patients:   StrictlyIdeas.no  Fact Sheet for Healthcare Providers: BankingDealers.co.za  This test is not yet approved or  cleared by the [place] FDA and has been authorized for detection and/or diagnosis of SARS-CoV-2 by FDA under an Emergency Use Authorization (EUA).  This EUA will remain in effect (meaning this test can be used) for the duration of the COVID-19 declaration under Section 564(b)(1) of the Act, 21 U.S.C. section 360bbb-3(b)(1), unless the authorization is terminated or revoked sooner.  Performed at [facility], [place] [address] [place], [place] [address]   Surgical PCR screen     Status: None   Collection Time: 04/03/20  4:52 PM   Specimen: Nasal Mucosa; Nasal Swab  Result Value Ref Range Status   MRSA, PCR NEGATIVE NEGATIVE Final   Staphylococcus aureus NEGATIVE NEGATIVE Final    Comment: (NOTE) The Xpert SA Assay (FDA approved for NASAL specimens in patients  10 years of age and older), is one component of a comprehensive surveillance program. It is not intended to diagnose infection nor to guide or monitor treatment. Performed at [facility], [place] [address], [place], [place] [address]   Aerobic/Anaerobic Culture (surgical/deep wound)     Status: None (Preliminary result)   Collection Time: 04/04/20 12:46 PM   Specimen: Abdomen; Abscess  Result Value Ref Range Status   Specimen Description   Final    ABDOMEN PERITONEAL ABSCESS Performed at [facility] [address], [place], [place] [address]    Special Requests   Final    NONE Performed at [facility], [place] [address], [place], [place] [address]    Gram Stain   Final    RARE WBC PRESENT, PREDOMINANTLY MONONUCLEAR NO ORGANISMS SEEN Performed at [facility], [place] [address] [place], [place] [address]    Culture   Final    RARE STAPHYLOCOCCUS EPIDERMIDIS SUSCEPTIBILITIES TO FOLLOW NO ANAEROBES ISOLATED; CULTURE IN PROGRESS FOR 5 DAYS    Report Status PENDING  Incomplete         Radiology Studies: No results found.      Scheduled Meds:  acetaminophen  1,000 mg Oral Q8H   alvimopan  12 mg Oral BID   bethanechol  10 mg Oral TID   Chlorhexidine Gluconate Cloth  6 each Topical Daily   enoxaparin (LOVENOX) injection  40 mg Subcutaneous Q24H   famotidine  20 mg Oral BID   feeding supplement (ENSURE ENLIVE)  237 mL Oral BID BM   ketorolac  15 mg Intravenous Q6H   Continuous Infusions:  ceFEPime (MAXIPIME) IV 2 g (04/08/20 0636)   lactated ringers with kcl 75 mL/hr at  04/08/20 0938   metronidazole 100 mL/hr at 04/08/20 0600   vancomycin 750 mg (04/08/20 0759)     LOS: 8 days    Time spent: 35 minutes    Irine Seal, MD [facility]   To contact the attending provider between 7A-7P or the covering provider during after hours 7P-7A, please log into the web site [email or web address]  and access using universal [place] password for that web site. If you do not have the password, please call the hospital operator.  04/08/2020, 10:57 AM

## 2020-04-08 NOTE — Progress Notes (Signed)
PT Cancellation Note  Patient Details Name: TARI LECOUNT MRN: [id number] DOB: [birth year]   Cancelled Treatment:    Reason Eval/Treat Not Completed: Other (comment); pt states she is about to eat, feels hungry for the first time, declines amb as it may trigger her nausea. Will continue efforts, pt would like to eventually work with PT.    Kenyon Ana 04/08/2020, 2:00 PM

## 2020-04-08 NOTE — Progress Notes (Signed)
4 Days Post-Op   Subjective/Chief Complaint: Pt doing well with pain control Ambulating in hall yesterday Some n/v after rxs yesterday  Objective: Vital signs in last 24 hours: Temp:  [97.7 F (36.5 C)-98.8 F (37.1 C)] 98.6 F (37 C) (07/11 0627) Pulse Rate:  [64-73] 72 (07/11 0627) Resp:  [16-18] 18 (07/11 0627) BP: (116-123)/(70-77) 116/71 (07/11 0627) SpO2:  [94 %-98 %] 97 % (07/11 0627) Weight:  [85.9 kg] 85.9 kg (07/11 0528) Last BM Date: 04/07/20  Intake/Output from previous day: 07/10 0701 - 07/11 0700 In: 1095.1 [P.O.:60; I.V.:328.9; IV Piggyback:706.2] Out: 4650 [Urine:3600; Drains:100; Stool:950] Intake/Output this shift: No intake/output data recorded.  General appearance: alert and cooperative GI: soft, non-tender; bowel sounds normal; no masses,  no organomegaly and Dr-ss, ostomy patent, vac in place  Lab Results:  Recent Labs    04/07/20 0513 04/08/20 0513  WBC 7.6 13.5*  HGB 8.9* 9.5*  HCT 28.7* 30.1*  PLT 215 240   BMET Recent Labs    04/07/20 0513 04/08/20 0513  NA 137 135  K 3.2* 4.1  CL 105 102  CO2 25 22  GLUCOSE 80 73  BUN 12 6  CREATININE 0.44 0.44  CALCIUM 7.4* 7.5*   PT/INR No results for input(s): LABPROT, INR in the last 72 hours. ABG No results for input(s): PHART, HCO3 in the last 72 hours.  Invalid input(s): PCO2, PO2  Studies/Results: No results found.  Anti-infectives: Anti-infectives (From admission, onward)   Start     Dose/Rate Route Frequency Ordered Stop   04/08/20 0000  vancomycin (VANCOREADY) IVPB 750 mg/150 mL     Discontinue     750 mg 150 mL/hr over 60 Minutes Intravenous Every 8 hours 04/07/20 1628     04/07/20 1645  vancomycin (VANCOREADY) IVPB 1500 mg/300 mL        1,500 mg 150 mL/hr over 120 Minutes Intravenous STAT 04/07/20 1638 04/07/20 1847   04/07/20 1630  vancomycin (VANCOREADY) IVPB 1500 mg/300 mL  Status:  Discontinued        1,500 mg 150 mL/hr over 120 Minutes Intravenous  Once 04/07/20  1627 04/07/20 1628   04/04/20 1045  cefoTEtan (CEFOTAN) 2 g in sodium chloride 0.9 % 100 mL IVPB        2 g 200 mL/hr over 30 Minutes Intravenous On call to O.R. 04/03/20 1639 04/04/20 1213   03/31/20 2000  ceFEPIme (MAXIPIME) 2 g in sodium chloride 0.9 % 100 mL IVPB     Discontinue     2 g 200 mL/hr over 30 Minutes Intravenous Every 8 hours 03/31/20 1408     03/31/20 2000  metroNIDAZOLE (FLAGYL) IVPB 500 mg     Discontinue     500 mg 100 mL/hr over 60 Minutes Intravenous Every 8 hours 03/31/20 1410     03/31/20 1600  ceFEPIme (MAXIPIME) 2 g in sodium chloride 0.9 % 100 mL IVPB  Status:  Discontinued        2 g 200 mL/hr over 30 Minutes Intravenous  Once 03/31/20 1549 03/31/20 1551   03/31/20 1200  piperacillin-tazobactam (ZOSYN) IVPB 3.375 g        3.375 g 100 mL/hr over 30 Minutes Intravenous  Once 03/31/20 1159 03/31/20 1341      Assessment/Plan: s/p Procedure(s): EXPLORATORY LAPAROTOMY (N/A) CYSTOSCOPY WITH STENT PLACEMENT  POD#4 cont FLD for today  Mobilize Pain control Abx  LOS: 8 days    Ralene Ok 04/08/2020

## 2020-04-08 NOTE — Progress Notes (Signed)
Chart reviewed and the sensitivities of the Staph epi noted and is pan sensitive.   I will stop vancomycin. She can continue with cefepime and flagyl for the intra abdominal abscess s/p debridement from her recent sigmoid colectomy and end colostomy.   At discharge, Augmentin will cover typical organisms including the Staph epi and would treat for 2 weeks total from today.  Thanks for consultation, will sign off Thayer Headings, MD

## 2020-04-08 NOTE — Progress Notes (Signed)
Pharmacy Antibiotic Note  Stephanie [name] is a 40 y.o. female admitted on 03/31/2020 with pelvic abscess. Patient has been on Cefepime and Metronidazole. ID added Vancomycin on 7/10 for Staph epidermidis in abscess culture. Pharmacy has been consulted for Vancomycin dosing.  Plan: Adjust Vancomycin to 1g IV q8h for updated weight  Cefepime 2g IV q8h per MD Metronidazole 500mg  IV q8h per MD Monitor renal function, cultures, clinical course, ID recommendations F/u de-escalation of antibiotics now that susceptibilities back on abscess culture  Height: 5\' 9"  (175.3 cm) Weight: 82.9 kg (182 lb 12.2 oz) IBW/kg (Calculated) : 66.2  Temp (24hrs), Avg:98.4 F (36.9 C), Min:97.7 F (36.5 C), Max:98.8 F (37.1 C)  Recent Labs  Lab 04/04/20 0445 04/05/20 0458 04/06/20 0430 04/07/20 0513 04/08/20 0513  WBC 9.8 10.5 8.7 7.6 13.5*  CREATININE 0.60 0.58 0.44 0.44 0.44    Estimated Creatinine Clearance: 108.7 mL/min (by C-G formula based on SCr of 0.44 mg/dL).    No Known Allergies  Antimicrobials this admission: 7/3 Cefepime >> 7/3 Metronidazole >> 7/10 Vancomycin >>  Microbiology results: 7/3 BCx: NGF 7/6 surgical PCR: MRSA neg, Staph aureus neg 7/7 abdomen peritoneal abscess: rare Staph epidermidis (pan-sensitive), no anaerobes isolated   Thank you for allowing pharmacy to be a part of this patient's care.   Lindell Spar, PharmD, BCPS Clinical Pharmacist  04/08/2020 11:36 AM

## 2020-04-09 LAB — BASIC METABOLIC PANEL
Anion gap: 9 (ref 5–15)
BUN: <5 mg/dL — ABNORMAL LOW (ref 6–20)
CO2: 23 mmol/L (ref 22–32)
Calcium: 7.7 mg/dL — ABNORMAL LOW (ref 8.9–10.3)
Chloride: 104 mmol/L (ref 98–111)
Creatinine, Ser: 0.47 mg/dL (ref 0.44–1.00)
GFR calc Af Amer: >60 mL/min (ref >60)
GFR calc non Af Amer: >60 mL/min (ref >60)
Glucose, Bld: 87 mg/dL (ref 70–99)
Potassium: 3.8 mmol/L (ref 3.5–5.1)
Sodium: 136 mmol/L (ref 135–145)

## 2020-04-09 LAB — CBC WITH DIFFERENTIAL/PLATELET
Abs Immature Granulocytes: 0.38 10*3/uL — ABNORMAL HIGH (ref 0.00–0.07)
Basophils Absolute: 0 10*3/uL (ref 0.0–0.1)
Basophils Relative: 0 %
Eosinophils Absolute: 0.2 10*3/uL (ref 0.0–0.5)
Eosinophils Relative: 2 %
HCT: 31.2 % — ABNORMAL LOW (ref 36.0–46.0)
Hemoglobin: 9.7 g/dL — ABNORMAL LOW (ref 12.0–15.0)
Immature Granulocytes: 3 %
Lymphocytes Relative: 6 %
Lymphs Abs: 0.8 10*3/uL (ref 0.7–4.0)
MCH: 25.3 pg — ABNORMAL LOW (ref 26.0–34.0)
MCHC: 31.1 g/dL (ref 30.0–36.0)
MCV: 81.3 fL (ref 80.0–100.0)
Monocytes Absolute: 0.6 10*3/uL (ref 0.1–1.0)
Monocytes Relative: 5 %
Neutro Abs: 10.7 10*3/uL — ABNORMAL HIGH (ref 1.7–7.7)
Neutrophils Relative %: 84 %
Platelets: 233 10*3/uL (ref 150–400)
RBC: 3.84 MIL/uL — ABNORMAL LOW (ref 3.87–5.11)
RDW: 21.2 % — ABNORMAL HIGH (ref 11.5–15.5)
WBC: 12.7 10*3/uL — ABNORMAL HIGH (ref 4.0–10.5)
nRBC: 0 % (ref 0.0–0.2)

## 2020-04-09 LAB — AEROBIC/ANAEROBIC CULTURE W GRAM STAIN (SURGICAL/DEEP WOUND)

## 2020-04-09 LAB — MAGNESIUM: Magnesium: 1.8 mg/dL (ref 1.7–2.4)

## 2020-04-09 MED ORDER — MAGNESIUM SULFATE 4 GM/100ML IV SOLN
4.0000 g | Freq: Once | INTRAVENOUS | Status: AC
  Administered 2020-04-09: 4 g via INTRAVENOUS
  Filled 2020-04-09: qty 100

## 2020-04-09 NOTE — Progress Notes (Addendum)
5 Days Post-Op   Subjective/Chief Complaint: Pt doing well with pain control, mobilizing more each day. Some nausea yesterday and one episode emesis overnight, reports gas/stool emptied from ostomy 4x in last 24 hours. FLD does not seem to cause nausea, she thinks it may have occurred after oral medication once.   Objective: Vital signs in last 24 hours: Temp:  [98.1 F (36.7 C)-98.2 F (36.8 C)] 98.2 F (36.8 C) (07/12 0642) Pulse Rate:  [66-73] 66 (07/12 0642) Resp:  [15-16] 15 (07/12 0642) BP: (125-129)/(79-85) 125/85 (07/12 0642) SpO2:  [97 %-98 %] 98 % (07/12 0642) Weight:  [82.9 kg-83.1 kg] 83.1 kg (07/12 0642) Last BM Date: 04/09/20  Intake/Output from previous day: 07/11 0701 - 07/12 0700 In: 1406.7 [P.O.:180; I.V.:456.2; IV Piggyback:770.5] Out: 4390 [Urine:4000; Drains:40; Stool:350] Intake/Output this shift: No intake/output data recorded.  General appearance: alert and cooperative GI: soft, approp tender; bowel sounds normal; no masses,  no organomegaly. Drain -ss, ostomy w/ gas/stool (350 cc/24h) GU: foley in place   Lab Results:  Recent Labs    04/08/20 0513 04/09/20 0527  WBC 13.5* 12.7*  HGB 9.5* 9.7*  HCT 30.1* 31.2*  PLT 240 233   BMET Recent Labs    04/08/20 0513 04/09/20 0527  NA 135 136  K 4.1 3.8  CL 102 104  CO2 22 23  GLUCOSE 73 87  BUN 6 <5*  CREATININE 0.44 0.47  CALCIUM 7.5* 7.7*   PT/INR No results for input(s): LABPROT, INR in the last 72 hours. ABG No results for input(s): PHART, HCO3 in the last 72 hours.  Invalid input(s): PCO2, PO2  Studies/Results: No results found.  Anti-infectives: Anti-infectives (From admission, onward)   Start     Dose/Rate Route Frequency Ordered Stop   04/08/20 1400  vancomycin (VANCOCIN) IVPB 1000 mg/200 mL premix  Status:  Discontinued        1,000 mg 200 mL/hr over 60 Minutes Intravenous Every 8 hours 04/08/20 1136 04/08/20 1322   04/08/20 0000  vancomycin (VANCOREADY) IVPB 750 mg/150 mL   Status:  Discontinued        750 mg 150 mL/hr over 60 Minutes Intravenous Every 8 hours 04/07/20 1628 04/08/20 1136   04/07/20 1645  vancomycin (VANCOREADY) IVPB 1500 mg/300 mL        1,500 mg 150 mL/hr over 120 Minutes Intravenous STAT 04/07/20 1638 04/07/20 1847   04/07/20 1630  vancomycin (VANCOREADY) IVPB 1500 mg/300 mL  Status:  Discontinued        1,500 mg 150 mL/hr over 120 Minutes Intravenous  Once 04/07/20 1627 04/07/20 1628   04/04/20 1045  cefoTEtan (CEFOTAN) 2 g in sodium chloride 0.9 % 100 mL IVPB        2 g 200 mL/hr over 30 Minutes Intravenous On call to O.R. 04/03/20 1639 04/04/20 1213   03/31/20 2000  ceFEPIme (MAXIPIME) 2 g in sodium chloride 0.9 % 100 mL IVPB     Discontinue     2 g 200 mL/hr over 30 Minutes Intravenous Every 8 hours 03/31/20 1408     03/31/20 2000  metroNIDAZOLE (FLAGYL) IVPB 500 mg     Discontinue     500 mg 100 mL/hr over 60 Minutes Intravenous Every 8 hours 03/31/20 1410     03/31/20 1600  ceFEPIme (MAXIPIME) 2 g in sodium chloride 0.9 % 100 mL IVPB  Status:  Discontinued        2 g 200 mL/hr over 30 Minutes Intravenous  Once 03/31/20 1549 03/31/20  1551   03/31/20 1200  piperacillin-tazobactam (ZOSYN) IVPB 3.375 g        3.375 g 100 mL/hr over 30 Minutes Intravenous  Once 03/31/20 1159 03/31/20 1341      Assessment/Plan: s/p Procedure(s): EXPLORATORY LAPAROTOMY (N/A) CYSTOSCOPY WITH STENT PLACEMENT  POD#5 - path: Invasive moderately differentiated adenocarcinoma, 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 Patient advised to back off to liquid diet if solids cause nausea/vomiting - Mobilize - PO Pain control - Abx per ID - NPWT M/W/F   FEN: SOFT ID: flagyl, maxipime  VTE: SCD's, lovenox Foley: replaced 7/9 for retention, on urecholine; recommend D/C foley repeat TOV today.   LOS: 9 days    Jill Alexanders 04/09/2020

## 2020-04-09 NOTE — Progress Notes (Signed)
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 Underwent sigmoid colectomy and colostomy on 7/7. Hospital course complicated by urinary retention.  Pt was admitted 6/14- 6/18 with abdominal pain and abscess with + liver mass.    OT comments  Need MAX encouragement to sit EOB with OT.   Pt declined standing but verbalized she wanted too but not at this time  Follow Up Recommendations  Home health OT;Supervision/Assistance - 24 hour    Equipment Recommendations  3 in 1 bedside commode    Recommendations for Other Services      Precautions / Restrictions Precautions Precautions: Fall Precaution Comments: abdominal wound vac       Mobility Bed Mobility Overal bed mobility: Needs Assistance Bed Mobility: Rolling;Sidelying to Sit;Sit to Sidelying;Supine to Sit Rolling: Min guard Sidelying to sit: Mod assist     Sit to sidelying: Mod assist General bed mobility comments: educated in log roll technique to minimize pain  Transfers              NT            ADL either performed or assessed with clinical judgement   ADL Overall ADL's : Needs assistance/impaired     Grooming: Wash/dry face;Minimal assistance                                 General ADL Comments: pt did agree to sit up with OT.  Pt did decline standing but did sit EOB for approx 10 min. Needed MAX encouragement.     Vision Patient Visual Report: No change from baseline            Cognition Arousal/Alertness: Awake/alert Behavior During Therapy: Flat affect Overall Cognitive Status: Within Functional Limits for tasks assessed                                                     Pertinent Vitals/ Pain        Faces Pain Scale: Hurts even more Pain Location: abdomen Pain Descriptors / Indicators: Grimacing;Dull Pain Intervention(s): Limited activity within patient's tolerance     Prior Functioning/Environment              Frequency  Min 2X/week        Progress Toward Goals  OT Goals(current goals can now be found in the care plan section)  Progress towards OT goals: Progressing toward goals     Plan Discharge plan remains appropriate    Co-evaluation                 AM-PAC OT "6 Clicks" Daily Activity     Outcome Measure   Help from another person eating meals?: None Help from another person taking care of personal grooming?: A Little Help from another person toileting, which includes using toliet, bedpan, or urinal?: A Little Help from another person bathing (including washing, rinsing, drying)?: A Little Help from another person to put on and taking off regular upper body clothing?: A Little Help from another person to put on and taking off regular  lower body clothing?: A Little 6 Click Score: 19    End of Session    OT Visit Diagnosis: Unsteadiness on feet (R26.81);Pain;Muscle weakness (generalized) (M62.81)   Activity Tolerance Treatment limited secondary to medical complications (Comment);Patient limited by fatigue;Patient limited by pain (nausea)   Patient Left in bed;with call bell/phone within reach   Nurse Communication Other (comment);Patient requests pain meds (pt is lactose intolerant)        Time: [id number] OT Time Calculation (min): 15 min  Charges: OT General Charges $OT Visit: 1 Visit OT Treatments $Self Care/Home Management : 8-22 mins  Kari Baars, [place] [phone] Office- [phone], Edwena Felty D 04/09/2020, 5:52 PM

## 2020-04-09 NOTE — Progress Notes (Signed)
PROGRESS NOTE    KIERRAH KILBRIDE  SVX:[id number] DOB: 06-24-1981 DOA: 03/31/2020 PCP: Patient, [name]    Chief Complaint  Patient presents with  . Abdominal Pain    Brief Narrative:  Raeghan Demeter Mittelmanis a 39 y.o.femalewith medical history significant ofrecent admission from June 14 to June 18 with abdominal pain and abscess with a question of sigmoidcolon malignancy versus diverticulitis. At that time she had an abnormal CT scan which also showed a liver mass. She had a barely elevated CEA at 4.9. She was placed on IV antibiotics, an attempt was made at liver biopsy however ultrasound suggested it might be a hemangioma. An MRI was equivocal. She was sent home on Augmentin which she completed and was doing well with until the last few days. She has continued to have blood in her stool. Stools are soft and not fully formed. She is passing gas. She has had increasing nausea with emesis over the last 12 hours. She reports ongoing lower abdominal cramping that is worse in the left lower quadrant. Her pain was 10 out of 10 requiring IV Dilaudid in the ED. She has an IUD in place, no sexual partner for the last 1 year. She denies vaginal discharge, vaginal bleeding, dysuria, hematuria, chest pain, or shortness of breath. In the ED she was sent for CT of the abdomen and pelvis which showed enlarging sigmoid colonic mass, which appeared which appears to be associated with focal perforation and has fistulized into the left adnexal region involving the left TOA. There are multiple enlarged lymph nodes which are bigger than before measuring up to 1.2 cm. The previously suspected metastatic lesion in the liver is also enlarged, small volume ascites. Patient placed empirically on IV antibiotics.  GI and general surgery were consulted.  Patient for exploratory laparotomy and partial colectomy with urethral stent placement today 04/04/2020 per general surgery. Patient with some complaints  of shortness of breath the morning of 04/04/2020, concern for volume overload and as such chest x-ray obtained and patient given a dose of IV Lasix, IV fluids discontinued.   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 There are multiple enlarged lymph nodes which are bigger than before measuring up to 1.2 cm. The previously suspected metastatic lesion in the liver is also enlarged, small volume ascites. Due to the location of the lesion percutaneous approach was not possible when Dr. Avon Gully discussed with IR or GI. General surgery consulted and had recommended IV antibiotics which patient was started on and patient was placed on IV cefepime, IV Flagyl, IV vancomycin for intra-abdominal abscess with presumed malignancy.  IV vancomycin added per ID recommendations, as abscess cultures preliminary readings for staph epidermis.  Once sensitivities had resulted IV vancomycin discontinued.  CEA at 5.6.  Patient status post open sigmoid colectomy and end colostomy per Dr. Barry Dienes, general surgery with findings concerning for sigmoid colon cancer with pelvic abscess.  Biopsies  consistent with invasive moderately differentiated adenocarcinoma, 6 cm, involving rectosigmoid junction. Carcinoma invades into serosal surface with perforation and associated serositis. Radial section margin is positive for carcinoma:  Proximal and distal margins are not involved. Lymphovascular invasion is present. Metastatic carcinoma to 1 of 15 lymph nodes, one tumor deposit.  Oncology consulted and patient has been set up for outpatient follow-up for further evaluation and management.  Due to recent diagnosis of cancer and pelvic abscess, ID was consulted and IV vancomycin added to IV cefepime and Flagyl.  Abscess cultures had resulted as positive for staph epidermis.  IV vancomycin was subsequently discontinued and ID recommended continuation of IV cefepime and IV Flagyl while in the hospital and on discharge to transition to Augmentin for 2 more weeks from 04/08/2020.  Patient initially n.p.o. postop and then subsequently started on a clear liquid diet and diet advanced to a full liquid diet and then subsequently a soft diet today.  Monitor while on soft diet.  Dilaudid PCA pump discontinued and patient now on IV Dilaudid as needed.  Per general surgery.   2.  Sigmoid colon cancer with pelvic abscess, POA  Presumed on admission secondary to enlarging mass noted on CT with enlarging lymph nodes and enlarging liver mass.  Patient was seen by general surgery and underwent exploratory laparotomy with findings of sigmoid colon cancer with pelvic abscess, status post open sigmoid colectomy and end colostomy per Dr. Barry Dienes, general surgery (04/04/2020). Pathology consistent with invasive moderately differentiated adenocarcinoma, 6 cm, involving rectosigmoid junction. Carcinoma invades into serosal surface with perforation and associated serositis. Radial section margin is positive for carcinoma: Proximal and distal margins are not involved. Lymphovascular invasion is present. Metastatic carcinoma to 1 of 15 lymph nodes, one tumor deposit.  Oncology consulted and patient has been set up for outpatient follow-up with oncology.  Due to concern for pelvic abscess ID was consulted and abscess cultures positive for staph epidermidis.  Patient started on  IV vancomycin per ID in addition to IV cefepime and IV Flagyl.  Once cultures have resulted IV vancomycin was discontinued and ID recommended continuation of IV cefepime and IV Flagyl in-house and on discharge transition to oral Augmentin and treat for 2 more weeks (Day #1 being 04/08/2020).   3.  Shortness of breath Secondary to volume overload.  Patient with complaints of shortness of breath early on in the hospitalization, when laying in the supine position feels likely secondary to abdominal swelling and swelling all over on 04/04/2020.[name]  Patient denies any chest pain.  Patient noted to be 12 L positive from Is/Os  however urine output not properly recorded.  Patient's weight on 04/04/2020 was 88.1 kg from 77.1 kg on admission.  Current weight of 83.1 kg.  Chest x-ray which was done showed left lower lobe retrocardiac collapse/consolidation and small left effusion.  Patient given a dose of Lasix 40 mg IV x1 (04/04/2020) with a urine output of 3.175 L. Clinical improvement. Patient on gentle hydration.  Doubt if patient has a pneumonia however patient empirically on IV antibiotics for intra-abdominal/pelvic abscess.  Continue strict I's and O's, daily weights.  Follow.   4.  Nausea/vomiting/abdominal pain.   Nausea and vomiting have improved since admission.  Patient status post open sigmoid colectomy and end colostomy per general surgery 04/04/2020 with findings concerning for sigmoid colon cancer with pelvic abscess.  Biopsies consistent with invasive moderately differentiated adenocarcinoma, 6 cm, involving rectosigmoid junction.  Patient has been transitioned off Dilaudid PCA pump and currently on  IV Dilaudid as needed.  See #1 and 2.  Supportive care.    5.  Acute blood loss anemia/chronic iron deficiency anemia Patient noted to have some scant bright red blood per rectum noted on previous exam.  Hemoglobin currently at 9.7.  Follow H&H.  Transfusion threshold hemoglobin < 7.  Patient seen by GI who felt  findings were worrisome for colon cancer, colonoscopy currently high risk in the setting of adnexal abscess involving the small bowel and recommending consideration for colonoscopy in 6 to 8 weeks for tissue diagnosis if patient does not require surgery prior to that time.  Patient underwent surgery on 04/04/2020 with biopsies consistent with adenocarcinoma.  Oncology consulted and patient will follow up with oncology in the outpatient setting.  Outpatient follow-up with GI.  6.  Moderate protein calorie malnutrition In the setting of above.  Patient with intractable nausea vomiting and abdominal pain on presentation which has since improved. Status post open sigmoid colectomy and end colostomy.  Patient tolerating full liquid diet.  Diet to be advanced to a soft diet as tolerated per general surgery.  Continue nutritional supplementation.   7.  Hypokalemia Likely secondary to GI losses.  Repleted.  Potassium at 3.8.  Follow.   8. Urinary retention Foley catheter discontinued per general surgery on 04/05/2020 however patient noted to have urinary retention had to undergo I and O cath.  Urine output of 4 L over the past 24 hours.  Foley catheter placed back in.  Patient for probable voiding trial per general surgery today.  Continue Urecholine.   DVT prophylaxis: SCDs Code Status: Full Family Communication: Updated patient.  No family at bedside. Disposition:   Status is: Inpatient    Dispo: The patient is from: Home              Anticipated d/c is to: Home              Anticipated d/c date is: Hopefully 2-3 days, when cleared by general surgery.       Patient currently postop with open sigmoid colectomy and end colostomy, on empiric IV antibiotics, biopsies consistent with adenocarcinoma. Oncology consulted.  ID consulted.  General surgery following.  Not stable for discharge.        Consultants:   General surgery: Dr. Leighton Ruff 03/05/6194  Gastroenterology: Dr. Tarri Glenn  04/01/2020  Wound care nurse Julien Girt, RN 04/04/2020  Oncology:  ID: Dr.Comer 04/07/2020  Procedures:   CT abdomen and pelvis 03/31/2020  Open sigmoid colectomy and end colostomy per Dr. Barry Dienes 04/04/2020  Antimicrobials:   IV cefepime 03/31/2020>>>>  Preop IV Cefotan 04/05/2019 21x1 dose  IV Flagyl 03/31/2020>>>>>>>  IV Zosyn 04/01/2019 21x1 dose  IV vancomycin 04/07/2020>>>> 04/08/2020   Subjective: Patient in bed holding emesis bag.  Patient states has some nausea bout of emesis last night and this morning.  Denies chest pain or shortness of breath.  Has questions in terms of recently diagnosed cancer.  Objective: Vitals:   04/08/20 1132 04/08/20 1336 04/08/20 2229 04/09/20 0642  BP:  129/79 125/81 125/85  Pulse:  73 70 66  Resp:  16 16 15   Temp:  98.1 F (36.7 C) 98.2 F (36.8 C) 98.2 F (36.8 C)  TempSrc:   Oral Oral  SpO2:  98% 97% 98%  Weight: 82.9 kg   83.1 kg  Height:        Intake/Output Summary (Last 24 hours) at 04/09/2020 1139 Last data filed at 04/09/2020 0654 Gross per 24 hour  Intake 1226.27 ml  Output 4390 ml  Net -3163.73 ml   Filed Weights   04/08/20 0528 04/08/20 1132 04/09/20 0642  Weight: 85.9 kg 82.9 kg 83.1 kg    Examination:  General exam: NAD Respiratory system: CTAB.  No wheezes, no crackles, no rhonchi.  Normal respiratory effort.  Speaking in full sentences.  Cardiovascular system: Regular rate rhythm no murmurs rubs or gallops.  No JVD.  No lower extremity edema.  Gastrointestinal system: Abdomen is soft, decreased diffuse tenderness to palpation, wound VAC over midline, left lower quadrant ostomy with stool noted.  JP drain with some serosanguineous fluid.  Central nervous system: Alert and oriented. No focal neurological deficits. Extremities: Symmetric 5 x 5 power. Skin: No rashes, lesions or ulcers Psychiatry: Judgement and insight appear normal. Mood & affect appropriate.     Data Reviewed: I have personally reviewed following  labs and imaging studies  CBC: Recent Labs  Lab 04/05/20 0458 04/06/20 0430 04/07/20 0513 04/08/20 0513 04/09/20 0527  WBC 10.5 8.7 7.6 13.5* 12.7*  NEUTROABS  --   --  5.8  --  10.7*  HGB 10.3* 9.3* 8.9* 9.5* 9.7*  HCT 33.2* 29.8* 28.7* 30.1* 31.2*  MCV 81.8 81.0 82.0 80.5 81.3  PLT 223 204 215 240 562    Basic Metabolic Panel: Recent Labs  Lab 04/04/20 0445 04/04/20 0445 04/05/20 0458 04/06/20 0430 04/07/20 0513 04/08/20 0513 04/09/20 0527  NA 139   < > 142 141 137 135 136  K 3.2*   < > 3.6 3.3* 3.2* 4.1 3.8  CL 105   < > 106 108 105 102 104  CO2 23   < > 19* 26 25 22 23   GLUCOSE 76   < > 120* 117* 80 73 87  BUN 10   < > 12 19 12 6  <5*  CREATININE 0.60   < > 0.58 0.44 0.44 0.44 0.47  CALCIUM 7.9*   < > 8.0* 8.0* 7.4* 7.5* 7.7*  MG 1.9  --   --  2.0 1.9  --  1.8   < > = values in this interval not displayed.    GFR: Estimated Creatinine Clearance: 108.8 mL/min (by C-G formula based on SCr of 0.47 mg/dL).  Liver Function Tests: Recent Labs  Lab 04/03/20 0430 04/04/20 0445 04/05/20 0458 04/06/20 0430  AST 12* 10* 11* 14*  ALT 10 8 8 8   ALKPHOS 50 53 47 39  BILITOT 0.8 0.9 1.0 0.5  PROT 5.0* 5.3* 5.0* 4.9*  ALBUMIN 2.2* 2.2* 1.9* 1.9*    CBG: No results for input(s): GLUCAP in the last 168 hours.   Recent Results (from the past 240 hour(s))  Blood culture (routine x 2)     Status: None   Collection Time: 03/31/20 12:52 PM   Specimen: BLOOD LEFT FOREARM  Result Value Ref Range Status   Specimen Description   Final    BLOOD LEFT FOREARM Performed at [facility], [place] [address] [place], [place] [address]    Special Requests   Final    BOTTLES DRAWN AEROBIC AND ANAEROBIC Blood Culture results may not be optimal due to an inadequate volume of blood received in culture bottles Performed at [facility] [address], [place], [place] [address]    Culture   Final    NO GROWTH 5 DAYS Performed at [facility],  [place] [address], [place],  [address]    Report Status 04/05/2020 FINAL  Final  Blood culture (routine x 2)     Status: None   Collection Time: 03/31/20 12:53 PM   Specimen: BLOOD  Result Value Ref Range Status   Specimen Description   Final    BLOOD LEFT WRIST Performed at [facility] [address] [place], [place] [address]    Special Requests   Final    BOTTLES DRAWN AEROBIC AND ANAEROBIC Blood Culture adequate volume Performed at [facility] [address], [place], [place] [address]    Culture   Final    NO GROWTH 5 DAYS Performed at [facility], [place] [address] [place], [place] [address]    Report Status 04/05/2020 FINAL  Final  SARS Coronavirus 2 by RT PCR (hospital order, performed in [facility] hospital lab) Nasopharyngeal Nasopharyngeal Swab     Status: None   Collection Time: 03/31/20 12:53 PM   Specimen: Nasopharyngeal Swab  Result Value Ref Range Status   SARS Coronavirus 2 NEGATIVE NEGATIVE Final    Comment: (NOTE) SARS-CoV-2 target nucleic acids are NOT DETECTED.  The SARS-CoV-2 RNA is generally detectable in upper and lower respiratory specimens during the acute phase of infection. The lowest concentration of SARS-CoV-2 viral copies this assay can detect is 250 copies / mL. A negative result does not preclude SARS-CoV-2 infection and should not be used as the sole basis for treatment or other patient management decisions.  A negative result may occur with improper specimen collection / handling, submission of specimen other than nasopharyngeal swab, presence of viral mutation(s) within the areas targeted by this assay, and inadequate number of viral copies (<250 copies / mL). A negative result must be combined with clinical observations, patient history, and epidemiological information.  Fact Sheet for Patients:   StrictlyIdeas.no  Fact Sheet for Healthcare  Providers: BankingDealers.co.za  This test is not yet approved or  cleared by the [place] FDA and has been authorized for detection and/or diagnosis of SARS-CoV-2 by FDA under an Emergency Use Authorization (EUA).  This EUA will remain in effect (meaning this test can be used) for the duration of the COVID-19 declaration under Section 564(b)(1) of the Act, 21 U.S.C. section 360bbb-3(b)(1), unless the authorization is terminated or revoked sooner.  Performed at [facility], [place] [address] [place], [place] [address]   Surgical PCR screen     Status: None   Collection Time: 04/03/20  4:52 PM   Specimen: Nasal Mucosa; Nasal Swab  Result Value Ref Range Status   MRSA, PCR NEGATIVE NEGATIVE Final   Staphylococcus aureus NEGATIVE NEGATIVE Final    Comment: (NOTE) The Xpert SA Assay (FDA approved for NASAL specimens in patients 5 years of age and older), is one component of a comprehensive surveillance program. It is not intended to diagnose infection nor to guide or monitor treatment. Performed at [facility], [place] [address] [place], [place] [address]   Aerobic/Anaerobic Culture (surgical/deep wound)     Status: None (Preliminary result)   Collection Time: 04/04/20 12:46 PM   Specimen: Abdomen; Abscess  Result Value Ref Range Status   Specimen Description   Final    ABDOMEN PERITONEAL ABSCESS Performed at [facility] [address] [place], [place] [address]    Special Requests   Final    NONE Performed at [facility], [place] [address] [place], [place] [address]    Gram Stain   Final    RARE WBC PRESENT, PREDOMINANTLY MONONUCLEAR  NO ORGANISMS SEEN Performed at [facility], [place] [address], [place], [place] [address]    Culture   Final    RARE STAPHYLOCOCCUS EPIDERMIDIS NO ANAEROBES ISOLATED; CULTURE IN PROGRESS FOR 5 DAYS    Report Status PENDING  Incomplete    Organism ID, Bacteria STAPHYLOCOCCUS EPIDERMIDIS  Final      Susceptibility   Staphylococcus epidermidis - MIC*    CIPROFLOXACIN <=0.5 SENSITIVE Sensitive     ERYTHROMYCIN <=0.25 SENSITIVE Sensitive     GENTAMICIN <=0.5 SENSITIVE Sensitive     OXACILLIN <=0.25 SENSITIVE Sensitive     TETRACYCLINE 2 SENSITIVE Sensitive     VANCOMYCIN 2 SENSITIVE Sensitive     TRIMETH/SULFA <=10 SENSITIVE Sensitive     CLINDAMYCIN <=0.25 SENSITIVE Sensitive     RIFAMPIN <=0.5 SENSITIVE Sensitive     Inducible Clindamycin NEGATIVE Sensitive     * RARE STAPHYLOCOCCUS EPIDERMIDIS         Radiology Studies: No results found.      Scheduled Meds: . acetaminophen  1,000 mg Oral Q8H  . bethanechol  10 mg Oral TID  . Chlorhexidine Gluconate Cloth  6 each Topical Daily  . enoxaparin (LOVENOX) injection  40 mg Subcutaneous Q24H  . famotidine  20 mg Oral BID  . ketorolac  15 mg Intravenous Q6H   Continuous Infusions: . ceFEPime (MAXIPIME) IV 2 g (04/09/20 0527)  . metronidazole 500 mg (04/09/20 0530)     LOS: 9 days    Time spent: 35 minutes    Irine Seal, MD [facility]   To contact the attending provider between 7A-7P or the covering provider during after hours 7P-7A, please log into the web site [email or web address] and access using universal [place] password for that web site. If you do not have the password, please call the hospital operator.  04/09/2020, 11:39 AM

## 2020-04-09 NOTE — Consult Note (Signed)
WOC Nurse Consult Note: Reason for Consult: NPWT dressing change Wound type:Surgical Pressure Injury POA: N/A Measurement: 16cm x 4.5cm x 1.5cm Wound IZX:YOFV pink, dry Drainage (amount, consistency, odor): small serous to serosanguinous  Periwound: 2.5cm x 1cm intact, serum filled blister at umbilicus. Dressing procedure/placement/frequency: Patient premedicated with DIlaudid 30 minutes prior to procedure and Toradol just prior to dressing change. Dressing removed using medical adhesive remover wipes. 1 piece of black foam removed. Periwound skin prepped with drape. Skin barrier ring placed to most distal periphery of wound to enhance seal from 5-7 o'clock and cover umbilical blister. One (1) piece of black foam used to obliterate dead space, drape used to seal. Dressing attached to 130mHg continuous negative pressure and an immediate seal is achieved. Next dressing change is due on Wednesday, 7/14.  Dressing kit is in the room.  [facility]Nurse ostomy follow up Stoma type/location: LLQ Colostomy Stomal assessment/size: edematous, pink, moist, lumen at center. Functioning. Peristomal assessment:  With 5cm x 2cm area of intact and ruptured blisters at most lateral side of skin barrier. MARSI (Medical Adhesive Related Skin Injury) Treatment options for stomal/peristomal skin: SKin barrier cut to ovoid affected area, xeroform dressing used to cover and topped with dry gauze 2x2s. Secured with paper tape. Output: brown/green soft stool  Ostomy pouching: 2pc. 2 and 3/4 inch pouching system with skin barrier ring. Education provided: Patient is able to perform Lock and Roll closure today after several demonstrations and with minimal assistance. We discuss using toilet paper as a way to clean the bottom 2 inches of the tail closure after emptying rather than washcloths or paper towel as toilet paper is disposable (via flushing), inexpensive and readily available. I also teach that emptying when 1/3 to 1/2 full  is ideal and that combining emptying with urination is a strategy to keep bathroom visits to a minimum (every 4 hours). We will continue teaching on Wednesday. Patient is encouraged to practice emptying with Nursing Techs/Nurses until independent. [place] Yes, previously.  [place] will follow, and will remain available to this patient, the nursing and medical teams.   Thanks, LMaudie Flakes MSN, RN, [place] CArther Abbott Pager# [phone]

## 2020-04-09 NOTE — Progress Notes (Signed)
PT Cancellation Note  Patient Details Name: Stephanie [name] MRN: [id number] DOB: 09-23-1981   Cancelled Treatment:    Reason Eval/Treat Not Completed: 3rd attempt to work with/evaluate pt per orders. Pt stated she is currently awaiting a dressing change and had just been medicated with IV meds. Explained importance of working with therapies and discussed when she preferred for PT to check back and attempt PT eval. Pt stated tomorrow around 10 a.m would be a good time to try back. Will plan to check back then. Recommend daily mobility/OOB with nursing in order to encourage activity.    [facility]  Office: [phone] Pager: [phone]

## 2020-04-10 LAB — CBC WITH DIFFERENTIAL/PLATELET
Abs Immature Granulocytes: 0.27 10*3/uL — ABNORMAL HIGH (ref 0.00–0.07)
Abs Immature Granulocytes: 0.21 10*3/uL — ABNORMAL HIGH (ref 0.00–0.07)
Basophils Absolute: 0 10*3/uL (ref 0.0–0.1)
Basophils Absolute: 0 10*3/uL (ref 0.0–0.1)
Basophils Relative: 0 %
Basophils Relative: 0 %
Eosinophils Absolute: 0.2 10*3/uL (ref 0.0–0.5)
Eosinophils Absolute: 0.2 10*3/uL (ref 0.0–0.5)
Eosinophils Relative: 2 %
Eosinophils Relative: 2 %
HCT: 31.1 % — ABNORMAL LOW (ref 36.0–46.0)
HCT: 30.8 % — ABNORMAL LOW (ref 36.0–46.0)
Hemoglobin: 9.7 g/dL — ABNORMAL LOW (ref 12.0–15.0)
Hemoglobin: 9.6 g/dL — ABNORMAL LOW (ref 12.0–15.0)
Immature Granulocytes: 3 %
Immature Granulocytes: 2 %
Lymphocytes Relative: 10 %
Lymphocytes Relative: 8 %
Lymphs Abs: 1 10*3/uL (ref 0.7–4.0)
Lymphs Abs: 0.9 10*3/uL (ref 0.7–4.0)
MCH: 25.3 pg — ABNORMAL LOW (ref 26.0–34.0)
MCH: 25.3 pg — ABNORMAL LOW (ref 26.0–34.0)
MCHC: 31.2 g/dL (ref 30.0–36.0)
MCHC: 31.2 g/dL (ref 30.0–36.0)
MCV: 81.2 fL (ref 80.0–100.0)
MCV: 81.1 fL (ref 80.0–100.0)
Monocytes Absolute: 0.7 10*3/uL (ref 0.1–1.0)
Monocytes Absolute: 0.7 10*3/uL (ref 0.1–1.0)
Monocytes Relative: 6 %
Monocytes Relative: 6 %
Neutro Abs: 8.5 10*3/uL — ABNORMAL HIGH (ref 1.7–7.7)
Neutro Abs: 9.5 10*3/uL — ABNORMAL HIGH (ref 1.7–7.7)
Neutrophils Relative %: 79 %
Neutrophils Relative %: 82 %
Platelets: 285 10*3/uL (ref 150–400)
Platelets: 292 10*3/uL (ref 150–400)
RBC: 3.83 MIL/uL — ABNORMAL LOW (ref 3.87–5.11)
RBC: 3.8 MIL/uL — ABNORMAL LOW (ref 3.87–5.11)
RDW: 21.4 % — ABNORMAL HIGH (ref 11.5–15.5)
RDW: 21.4 % — ABNORMAL HIGH (ref 11.5–15.5)
WBC: 10.7 10*3/uL — ABNORMAL HIGH (ref 4.0–10.5)
WBC: 11.5 10*3/uL — ABNORMAL HIGH (ref 4.0–10.5)
nRBC: 0 % (ref 0.0–0.2)
nRBC: 0 % (ref 0.0–0.2)

## 2020-04-10 LAB — BASIC METABOLIC PANEL
Anion gap: 11 (ref 5–15)
BUN: <5 mg/dL — ABNORMAL LOW (ref 6–20)
CO2: 23 mmol/L (ref 22–32)
Calcium: 7.8 mg/dL — ABNORMAL LOW (ref 8.9–10.3)
Chloride: 104 mmol/L (ref 98–111)
Creatinine, Ser: 0.48 mg/dL (ref 0.44–1.00)
GFR calc Af Amer: >60 mL/min (ref >60)
GFR calc non Af Amer: >60 mL/min (ref >60)
Glucose, Bld: 83 mg/dL (ref 70–99)
Potassium: 3.8 mmol/L (ref 3.5–5.1)
Sodium: 138 mmol/L (ref 135–145)

## 2020-04-10 LAB — MAGNESIUM: Magnesium: 2 mg/dL (ref 1.7–2.4)

## 2020-04-10 MED ORDER — AMOXICILLIN-POT CLAVULANATE 875-125 MG PO TABS
1.0000 | ORAL_TABLET | Freq: Two times a day (BID) | ORAL | Status: DC
  Administered 2020-04-10 – 2020-04-16 (×13): 1 via ORAL
  Filled 2020-04-10 (×13): qty 1

## 2020-04-10 MED ORDER — TRAMADOL HCL 50 MG PO TABS
50.0000 mg | ORAL_TABLET | Freq: Four times a day (QID) | ORAL | Status: DC | PRN
  Administered 2020-04-10 – 2020-04-14 (×4): 50 mg via ORAL
  Filled 2020-04-10 (×5): qty 1

## 2020-04-10 NOTE — Progress Notes (Signed)
Progress Note: General Surgery Service   Chief Complaint/Subjective: Vomited yesterday, up to chair yesterday, not a great appetite, feels weak  Objective: Vital signs in last 24 hours: Temp:  [97.7 F (36.5 C)-98.2 F (36.8 C)] 97.7 F (36.5 C) (07/13 0547) Pulse Rate:  [62-67] 62 (07/13 0547) Resp:  [15-16] 15 (07/13 0547) BP: (118-126)/(72-79) 126/79 (07/13 0547) SpO2:  [97 %-100 %] 97 % (07/13 0547) Last BM Date: 04/09/20  Intake/Output from previous day: 07/12 0701 - 07/13 0700 In: 1167.3 [P.O.:390; IV Piggyback:777.3] Out: 2460 [Urine:2350; Emesis/NG output:100; Drains:10] Intake/Output this shift: No intake/output data recorded.  Gen: NAD  Resp: nonlabored  Card: RRR  Abd: soft, vac in place, ostomy with some liquid in bag  Lab Results: CBC  Recent Labs    04/09/20 0527 04/10/20 0801  WBC 12.7* 10.7*  HGB 9.7* 9.7*  HCT 31.2* 31.1*  PLT 233 285   BMET Recent Labs    04/09/20 0527 04/10/20 0506  NA 136 138  K 3.8 3.8  CL 104 104  CO2 23 23  GLUCOSE 87 83  BUN <5* <5*  CREATININE 0.47 0.48  CALCIUM 7.7* 7.8*   PT/INR No results for input(s): LABPROT, INR in the last 72 hours. ABG No results for input(s): PHART, HCO3 in the last 72 hours.  Invalid input(s): PCO2, PO2  Anti-infectives: Anti-infectives (From admission, onward)   Start     Dose/Rate Route Frequency Ordered Stop   04/08/20 1400  vancomycin (VANCOCIN) IVPB 1000 mg/200 mL premix  Status:  Discontinued        1,000 mg 200 mL/hr over 60 Minutes Intravenous Every 8 hours 04/08/20 1136 04/08/20 1322   04/08/20 0000  vancomycin (VANCOREADY) IVPB 750 mg/150 mL  Status:  Discontinued        750 mg 150 mL/hr over 60 Minutes Intravenous Every 8 hours 04/07/20 1628 04/08/20 1136   04/07/20 1645  vancomycin (VANCOREADY) IVPB 1500 mg/300 mL        1,500 mg 150 mL/hr over 120 Minutes Intravenous STAT 04/07/20 1638 04/07/20 1847   04/07/20 1630  vancomycin (VANCOREADY) IVPB 1500 mg/300 mL   Status:  Discontinued        1,500 mg 150 mL/hr over 120 Minutes Intravenous  Once 04/07/20 1627 04/07/20 1628   04/04/20 1045  cefoTEtan (CEFOTAN) 2 g in sodium chloride 0.9 % 100 mL IVPB        2 g 200 mL/hr over 30 Minutes Intravenous On call to O.R. 04/03/20 1639 04/04/20 1213   03/31/20 2000  ceFEPIme (MAXIPIME) 2 g in sodium chloride 0.9 % 100 mL IVPB     Discontinue     2 g 200 mL/hr over 30 Minutes Intravenous Every 8 hours 03/31/20 1408     03/31/20 2000  metroNIDAZOLE (FLAGYL) IVPB 500 mg     Discontinue     500 mg 100 mL/hr over 60 Minutes Intravenous Every 8 hours 03/31/20 1410     03/31/20 1600  ceFEPIme (MAXIPIME) 2 g in sodium chloride 0.9 % 100 mL IVPB  Status:  Discontinued        2 g 200 mL/hr over 30 Minutes Intravenous  Once 03/31/20 1549 03/31/20 1551   03/31/20 1200  piperacillin-tazobactam (ZOSYN) IVPB 3.375 g        3.375 g 100 mL/hr over 30 Minutes Intravenous  Once 03/31/20 1159 03/31/20 1341      Medications: Scheduled Meds: . acetaminophen  1,000 mg Oral Q8H  . bethanechol  10 mg Oral  TID  . Chlorhexidine Gluconate Cloth  6 each Topical Daily  . enoxaparin (LOVENOX) injection  40 mg Subcutaneous Q24H  . famotidine  20 mg Oral BID   Continuous Infusions: . ceFEPime (MAXIPIME) IV 2 g (04/10/20 0514)  . 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 Patient advised to back off to liquid diet if solids cause nausea/vomiting - Mobilize - PO Pain control - Abx per ID - NPWT M/W/F  -discussed path results with patient, discussed patient with Dr. Benay Spice [facility])  and patient will get follow up with his group in the office. He discussed benefit of port in the next few weeks.  FEN: SOFT ID: flagyl, maxipime stopped 7/13 VTE: SCD's, lovenox Foley: replaced 7/9 for retention, on urecholine   LOS: 10 days   Mickeal Skinner, MD [facility]

## 2020-04-10 NOTE — Progress Notes (Signed)
PROGRESS NOTE    Stephanie [name]  GHW:[id number] DOB: 28-Oct-1980 DOA: 03/31/2020 PCP: Patient, [name]    Chief Complaint  Patient presents with  . Abdominal Pain    Brief Narrative:  Stephanie [name] a 39 y.o.femalewith medical history significant ofrecent admission from June 14 to June 18 with abdominal pain and abscess with a question of sigmoidcolon malignancy versus diverticulitis. At that time she had an abnormal CT scan which also showed a liver mass. She had a barely elevated CEA at 4.9. She was placed on IV antibiotics, an attempt was made at liver biopsy however ultrasound suggested it might be a hemangioma. An MRI was equivocal. She was sent home on Augmentin which she completed and was doing well with until the last few days. She has continued to have blood in her stool. Stools are soft and not fully formed. She is passing gas. She has had increasing nausea with emesis over the last 12 hours. She reports ongoing lower abdominal cramping that is worse in the left lower quadrant. Her pain was 10 out of 10 requiring IV Dilaudid in the ED. She has an IUD in place, no sexual partner for the last 1 year. She denies vaginal discharge, vaginal bleeding, dysuria, hematuria, chest pain, or shortness of breath. In the ED she was sent for CT of the abdomen and pelvis which showed enlarging sigmoid colonic mass, which appeared which appears to be associated with focal perforation and has fistulized into the left adnexal region involving the left TOA. There are multiple enlarged lymph nodes which are bigger than before measuring up to 1.2 cm. The previously suspected metastatic lesion in the liver is also enlarged, small volume ascites. Patient placed empirically on IV antibiotics.  GI and general surgery were consulted.  Patient for exploratory laparotomy and partial colectomy with urethral stent placement today 04/04/2020 per general surgery. Patient with some complaints  of shortness of breath the morning of 04/04/2020, concern for volume overload and as such chest x-ray obtained and patient given a dose of IV Lasix, IV fluids discontinued.   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 There are multiple enlarged lymph nodes which are bigger than before measuring up to 1.2 cm. The previously suspected metastatic lesion in the liver is also enlarged, small volume ascites. Due to the location of the lesion percutaneous approach was not possible when Dr. Avon Gully discussed with IR or GI. General surgery consulted and had recommended IV antibiotics which patient was started on and patient was placed on IV cefepime, IV Flagyl, IV vancomycin for intra-abdominal abscess with presumed malignancy.  IV vancomycin added per ID recommendations, as abscess cultures preliminary readings for staph epidermis. As sensitivities had resulted IV vancomycin discontinued.  CEA at 5.6.  Patient status post open sigmoid colectomy and end colostomy per Dr. Barry Dienes, general surgery with findings concerning for sigmoid colon cancer with pelvic abscess.  Biopsies  consistent with invasive moderately differentiated adenocarcinoma, 6 cm, involving rectosigmoid junction. Carcinoma invades into serosal surface with perforation and associated serositis. Radial section margin is positive for carcinoma: Proximal  and distal margins are not involved. Lymphovascular invasion is present. Metastatic carcinoma to 1 of 15 lymph nodes, one tumor deposit.  Oncology consulted and patient has been set up for outpatient follow-up for further evaluation and management.  Due to recent diagnosis of cancer and pelvic abscess, ID was consulted and IV vancomycin added to IV cefepime and Flagyl.  Abscess cultures had resulted as positive for staph epidermis.  IV vancomycin discontinued and ID recommended continuation of IV cefepime and IV Flagyl while in the hospital and on discharge to transition to Augmentin for 2 more weeks from 04/08/2020.  IV antibiotics have been discontinued per general surgery.  Will start oral Augmentin as recommended per ID.  Currently on IV Dilaudid as needed.  Diet has been advanced to a soft diet.  Per general surgery.    2.  Sigmoid colon cancer with pelvic abscess, POA  Presumed on admission secondary to enlarging mass noted on CT with enlarging lymph nodes and enlarging liver mass.  Patient was seen by general surgery and underwent exploratory laparotomy with findings of sigmoid colon cancer with pelvic abscess, status post open sigmoid colectomy and end colostomy per Dr. Barry Dienes, general surgery (04/04/2020). Pathology consistent with invasive moderately differentiated adenocarcinoma, 6 cm, involving rectosigmoid junction. Carcinoma invades into serosal surface with perforation and associated serositis. Radial section margin is positive for carcinoma: Proximal and distal margins are not involved. Lymphovascular invasion is present. Metastatic carcinoma to 1 of 15 lymph nodes, one tumor deposit.  Oncology consulted and patient has been set up for outpatient follow-up with oncology.  Due to concern for pelvic abscess ID was consulted and abscess cultures positive for staph epidermidis.  Patient started on IV vancomycin per ID in addition to IV cefepime and IV Flagyl.  Once cultures have resulted IV vancomycin  was discontinued and ID recommended continuation of IV cefepime and IV Flagyl in-house and on discharge transition to oral Augmentin and treat for 2 more weeks (Day #1 being 04/08/2020).  IV antibiotics have been discontinued this morning per general surgery.  Will start patient on oral Augmentin as recommended per ID.  3.  Shortness of breath Secondary to volume overload.  Patient with complaints of shortness of breath early on in the hospitalization, when laying in the supine position feels likely secondary to abdominal swelling and swelling all over on 04/04/2020.[name]  Patient denies any chest pain.  Patient noted to be 12 L positive from Is/Os  however urine output not properly recorded.  Patient's weight on 04/04/2020 was 88.1 kg from 77.1 kg on admission.  Current weight of 83.1 kg.  Chest x-ray which was done showed left lower lobe retrocardiac collapse/consolidation and small left effusion.  Patient given a dose of Lasix 40 mg IV x1 (04/04/2020) with a urine output of 3.175 L. Clinical improvement.  IV fluids have been saline locked. Doubt if patient has a pneumonia however patient was empirically on IV antibiotics for intra-abdominal/pelvic abscess.  Continue strict I's and O's, daily weights.  Follow.   4.  Nausea/vomiting/abdominal pain.   Nausea and vomiting have improved since admission.  Patient status post open sigmoid colectomy and end colostomy per general surgery 04/04/2020 with findings concerning for sigmoid colon cancer with pelvic abscess.  Biopsies consistent with invasive moderately differentiated adenocarcinoma, 6 cm, involving rectosigmoid junction.  Patient has  been transitioned off Dilaudid PCA pump and currently on IV Dilaudid as needed.  See #1 and 2.  Supportive care.    5.  Acute blood loss anemia/chronic iron deficiency anemia Patient noted to have some scant bright red blood per rectum noted on previous exam.  Hemoglobin currently at 9.6.  Follow H&H.  Transfusion threshold hemoglobin  < 7.  Patient seen by GI who felt findings were worrisome for colon cancer, colonoscopy currently high risk in the setting of adnexal abscess involving the small bowel and recommending consideration for colonoscopy in 6 to 8 weeks for tissue diagnosis if patient does not require surgery prior to that time.  Patient underwent surgery on 04/04/2020 with biopsies consistent with adenocarcinoma.  Oncology consulted and patient will follow up with oncology in the outpatient setting.  Outpatient follow-up with GI.  6.  Moderate protein calorie malnutrition In the setting of above.  Patient with intractable nausea vomiting and abdominal pain on presentation which has since improved. Status post open sigmoid colectomy and end colostomy.  Patient tolerating full liquid diet and diet has been advanced to a soft diet per general surgery.  Continue nutritional supplementation.  Patient with a lactose intolerance and as such ensure was discontinued.  7.  Hypokalemia Likely secondary to GI losses.  Repleted.  Potassium at 3.8.  Follow.   8. Urinary retention Foley catheter discontinued per general surgery on 04/05/2020 however patient noted to have urinary retention had to undergo I and O cath.  Urine output of 2.3 L over the past 24 hours.  Foley catheter was placed back in and subsequently removed today.  General surgery for voiding trial.  Continue Urecholine.   DVT prophylaxis: SCDs Code Status: Full Family Communication: Updated patient.  No family at bedside. Disposition:   Status is: Inpatient    Dispo: The patient is from: Home              Anticipated d/c is to: Home              Anticipated d/c date is: Hopefully 2-3 days, when cleared by general surgery.       Patient currently postop with open sigmoid colectomy and end colostomy, on empiric IV antibiotics, biopsies consistent with adenocarcinoma. Oncology consulted.  ID consulted.  General surgery following.  Not stable for discharge.          Consultants:   General surgery: Dr. Leighton Ruff 12/03/6438  Gastroenterology: Dr. Tarri Glenn 04/01/2020  Wound care nurse Julien Girt, RN 04/04/2020  Oncology:  ID: Dr.Comer 04/07/2020  Procedures:   CT abdomen and pelvis 03/31/2020  Open sigmoid colectomy and end colostomy per Dr. Barry Dienes 04/04/2020  Antimicrobials:   IV cefepime 03/31/2020>>>> 04/10/2020  Preop IV Cefotan 04/05/2019 21x1 dose  IV Flagyl 03/31/2020>>>>>>> 04/10/2020  IV Zosyn 04/01/2019 21x1 dose  IV vancomycin 04/07/2020>>>> 04/08/2020  Augmentin 04/10/2020   Subjective: Patient sitting up in bed.  Stated was in the chair earlier on.  Denies any chest pain.  No shortness of breath.  Some abdominal discomfort around surgical site per patient.  Patient stated had a bowel movement from her rectum early on this morning.  Not sure whether she is passing flatus.  Episode of nausea no no emesis per patient.  Diet has been advanced to a soft diet per General surgery.   Objective: Vitals:   04/09/20 0642 04/09/20 1322 04/09/20 2204 04/10/20 0547  BP: 125/85 118/72 121/78 126/79  Pulse: 66 67 67 62  Resp: 15 16 15  15  Temp: 98.2 F (36.8 C) 98.2 F (36.8 C) 97.9 F (36.6 C) 97.7 F (36.5 C)  TempSrc: Oral  Oral Oral  SpO2: 98% 100% 97% 97%  Weight: 83.1 kg     Height:        Intake/Output Summary (Last 24 hours) at 04/10/2020 1231 Last data filed at 04/10/2020 1100 Gross per 24 hour  Intake 1039.17 ml  Output 3135 ml  Net -2095.83 ml   Filed Weights   04/08/20 0528 04/08/20 1132 04/09/20 0642  Weight: 85.9 kg 82.9 kg 83.1 kg    Examination:  General exam: No acute distress. Respiratory system: Lungs clear to auscultation bilaterally.  No wheezes, no crackles, no rhonchi.  Normal respiratory effort.  Speaking in full sentences.  Cardiovascular system: RRR no murmurs rubs or gallops.  No JVD.  No lower extremity edema.  Gastrointestinal system: Abdomen is soft, positive bowel sounds, decreased tenderness to  palpation, wound VAC over midline, left lower quadrant ostomy with stool noted.  JP drain.  Central nervous system: Alert and oriented. No focal neurological deficits. Extremities: Symmetric 5 x 5 power. Skin: No rashes, lesions or ulcers Psychiatry: Judgement and insight appear normal. Mood & affect appropriate.     Data Reviewed: I have personally reviewed following labs and imaging studies  CBC: Recent Labs  Lab 04/06/20 0430 04/07/20 0513 04/08/20 0513 04/09/20 0527 04/10/20 0801  WBC 8.7 7.6 13.5* 12.7* 10.7*  NEUTROABS  --  5.8  --  10.7* 8.5*  HGB 9.3* 8.9* 9.5* 9.7* 9.7*  HCT 29.8* 28.7* 30.1* 31.2* 31.1*  MCV 81.0 82.0 80.5 81.3 81.2  PLT 204 215 240 233 191    Basic Metabolic Panel: Recent Labs  Lab 04/04/20 0445 04/05/20 0458 04/06/20 0430 04/07/20 0513 04/08/20 0513 04/09/20 0527 04/10/20 0506  NA 139   < > 141 137 135 136 138  K 3.2*   < > 3.3* 3.2* 4.1 3.8 3.8  CL 105   < > 108 105 102 104 104  CO2 23   < > 26 25 22 23 23   GLUCOSE 76   < > 117* 80 73 87 83  BUN 10   < > 19 12 6  <5* <5*  CREATININE 0.60   < > 0.44 0.44 0.44 0.47 0.48  CALCIUM 7.9*   < > 8.0* 7.4* 7.5* 7.7* 7.8*  MG 1.9  --  2.0 1.9  --  1.8 2.0   < > = values in this interval not displayed.    GFR: Estimated Creatinine Clearance: 108.8 mL/min (by C-G formula based on SCr of 0.48 mg/dL).  Liver Function Tests: Recent Labs  Lab 04/04/20 0445 04/05/20 0458 04/06/20 0430  AST 10* 11* 14*  ALT 8 8 8   ALKPHOS 53 47 39  BILITOT 0.9 1.0 0.5  PROT 5.3* 5.0* 4.9*  ALBUMIN 2.2* 1.9* 1.9*    CBG: No results for input(s): GLUCAP in the last 168 hours.   Recent Results (from the past 240 hour(s))  Blood culture (routine x 2)     Status: None   Collection Time: 03/31/20 12:52 PM   Specimen: BLOOD LEFT FOREARM  Result Value Ref Range Status   Specimen Description   Final    BLOOD LEFT FOREARM Performed at [facility], [place] [address], [place], [place] [address]     Special Requests   Final    BOTTLES DRAWN AEROBIC AND ANAEROBIC Blood Culture results may not be optimal due to an inadequate volume  of blood received in culture bottles Performed at [facility], [place] [address], [place], [place] [address]    Culture   Final    NO GROWTH 5 DAYS Performed at [facility], [place] [address], [place], [place] [address]    Report Status 04/05/2020 FINAL  Final  Blood culture (routine x 2)     Status: None   Collection Time: 03/31/20 12:53 PM   Specimen: BLOOD  Result Value Ref Range Status   Specimen Description   Final    BLOOD LEFT WRIST Performed at [place] [address], [place], [place] [address]    Special Requests   Final    BOTTLES DRAWN AEROBIC AND ANAEROBIC Blood Culture adequate volume Performed at [place] [address], [place], [place] [address]    Culture   Final    NO GROWTH 5 DAYS Performed at [facility], [place] [address] [place], [place] [address]    Report Status 04/05/2020 FINAL  Final  SARS Coronavirus 2 by RT PCR (hospital order, performed in [facility] hospital lab) Nasopharyngeal Nasopharyngeal Swab     Status: None   Collection Time: 03/31/20 12:53 PM   Specimen: Nasopharyngeal Swab  Result Value Ref Range Status   SARS Coronavirus 2 NEGATIVE NEGATIVE Final    Comment: (NOTE) SARS-CoV-2 target nucleic acids are NOT DETECTED.  The SARS-CoV-2 RNA is generally detectable in upper and lower respiratory specimens during the acute phase of infection. The lowest concentration of SARS-CoV-2 viral copies this assay can detect is 250 copies / mL. A negative result does not preclude SARS-CoV-2 infection and should not be used as the sole basis for treatment or other patient management decisions.  A negative result may occur with improper specimen collection / handling, submission of specimen other than nasopharyngeal swab, presence of viral mutation(s)  within the areas targeted by this assay, and inadequate number of viral copies (<250 copies / mL). A negative result must be combined with clinical observations, patient history, and epidemiological information.  Fact Sheet for Patients:   StrictlyIdeas.no  Fact Sheet for Healthcare Providers: BankingDealers.co.za  This test is not yet approved or  cleared by the [place] FDA and has been authorized for detection and/or diagnosis of SARS-CoV-2 by FDA under an Emergency Use Authorization (EUA).  This EUA will remain in effect (meaning this test can be used) for the duration of the COVID-19 declaration under Section 564(b)(1) of the Act, 21 U.S.C. section 360bbb-3(b)(1), unless the authorization is terminated or revoked sooner.  Performed at [facility], [place] [address], [place], [place] [address]   Surgical PCR screen     Status: None   Collection Time: 04/03/20  4:52 PM   Specimen: Nasal Mucosa; Nasal Swab  Result Value Ref Range Status   MRSA, PCR NEGATIVE NEGATIVE Final   Staphylococcus aureus NEGATIVE NEGATIVE Final    Comment: (NOTE) The Xpert SA Assay (FDA approved for NASAL specimens in patients 40 years of age and older), is one component of a comprehensive surveillance program. It is not intended to diagnose infection nor to guide or monitor treatment. Performed at [facility], [place] [address], [place], [place] [address]   Aerobic/Anaerobic Culture (surgical/deep wound)     Status: None   Collection Time: 04/04/20 12:46 PM   Specimen: Abdomen; Abscess  Result Value Ref Range Status   Specimen Description   Final    ABDOMEN PERITONEAL ABSCESS  Performed at [facility], [place] [address] [place], [place] [address]    Special Requests   Final    NONE Performed at [facility], [place] [address], [place], [place] [address]    Gram Stain   Final      RARE WBC PRESENT, PREDOMINANTLY MONONUCLEAR NO ORGANISMS SEEN    Culture   Final    RARE STAPHYLOCOCCUS EPIDERMIDIS NO ANAEROBES ISOLATED Performed at [facility], [place] [address] [place], [place] [address]    Report Status 04/09/2020 FINAL  Final   Organism ID, Bacteria STAPHYLOCOCCUS EPIDERMIDIS  Final      Susceptibility   Staphylococcus epidermidis - MIC*    CIPROFLOXACIN <=0.5 SENSITIVE Sensitive     ERYTHROMYCIN <=0.25 SENSITIVE Sensitive     GENTAMICIN <=0.5 SENSITIVE Sensitive     OXACILLIN <=0.25 SENSITIVE Sensitive     TETRACYCLINE 2 SENSITIVE Sensitive     VANCOMYCIN 2 SENSITIVE Sensitive     TRIMETH/SULFA <=10 SENSITIVE Sensitive     CLINDAMYCIN <=0.25 SENSITIVE Sensitive     RIFAMPIN <=0.5 SENSITIVE Sensitive     Inducible Clindamycin NEGATIVE Sensitive     * RARE STAPHYLOCOCCUS EPIDERMIDIS         Radiology Studies: No results found.      Scheduled Meds: . acetaminophen  1,000 mg Oral Q8H  . amoxicillin-clavulanate  1 tablet Oral Q12H  . bethanechol  10 mg Oral TID  . Chlorhexidine Gluconate Cloth  6 each Topical Daily  . enoxaparin (LOVENOX) injection  40 mg Subcutaneous Q24H  . famotidine  20 mg Oral BID   Continuous Infusions:    LOS: 10 days    Time spent: 35 minutes    Irine Seal, MD [facility]   To contact the attending provider between 7A-7P or the covering provider during after hours 7P-7A, please log into the web site [email or web address] and access using universal [place] password for that web site. If you do not have the password, please call the hospital operator.  04/10/2020, 12:31 PM

## 2020-04-10 NOTE — Progress Notes (Signed)
PT Cancellation Note  Patient Details Name: Stephanie [name] MRN: [id number] DOB: [birth year]   Cancelled Treatment:    Reason Eval/Treat Not Completed: Medical issues which prohibited therapy, have attempted PT x 2, needing meds/bath, then needing a rest. Will check back as schedule allows.   Claretha Cooper 04/10/2020, 12:35 PM [place] Pager [phone] Office [phone]

## 2020-04-10 NOTE — Progress Notes (Signed)
Pt was able to to empty her colostomy while sitting on commode with minimal assistance.

## 2020-04-10 NOTE — Plan of Care (Signed)
?  Problem: Clinical Measurements: ?Goal: Ability to maintain clinical measurements within normal limits will improve ?Outcome: Progressing ?Goal: Will remain free from infection ?Outcome: Progressing ?Goal: Diagnostic test results will improve ?Outcome: Progressing ?  ?

## 2020-04-10 NOTE — Progress Notes (Signed)
Per pt and Nursing instructor on floor, pt had a med size amount of stool in underwear.  Instructed pt she may continue to pass some mucus and stool left from prior to surgery.  Instructed to make me aware if this happens again and I will take a look at discharge.

## 2020-04-11 ENCOUNTER — Inpatient Hospital Stay (HOSPITAL_COMMUNITY): Payer: Medicaid Other

## 2020-04-11 LAB — BASIC METABOLIC PANEL
Anion gap: 11 (ref 5–15)
BUN: <5 mg/dL — ABNORMAL LOW (ref 6–20)
CO2: 22 mmol/L (ref 22–32)
Calcium: 7.9 mg/dL — ABNORMAL LOW (ref 8.9–10.3)
Chloride: 103 mmol/L (ref 98–111)
Creatinine, Ser: 0.37 mg/dL — ABNORMAL LOW (ref 0.44–1.00)
GFR calc Af Amer: >60 mL/min (ref >60)
GFR calc non Af Amer: >60 mL/min (ref >60)
Glucose, Bld: 85 mg/dL (ref 70–99)
Potassium: 3.5 mmol/L (ref 3.5–5.1)
Sodium: 136 mmol/L (ref 135–145)

## 2020-04-11 LAB — CBC WITH DIFFERENTIAL/PLATELET
Abs Immature Granulocytes: 0.2 10*3/uL — ABNORMAL HIGH (ref 0.00–0.07)
Basophils Absolute: 0 10*3/uL (ref 0.0–0.1)
Basophils Relative: 0 %
Eosinophils Absolute: 0.1 10*3/uL (ref 0.0–0.5)
Eosinophils Relative: 1 %
HCT: 31 % — ABNORMAL LOW (ref 36.0–46.0)
Hemoglobin: 9.7 g/dL — ABNORMAL LOW (ref 12.0–15.0)
Immature Granulocytes: 2 %
Lymphocytes Relative: 8 %
Lymphs Abs: 0.8 10*3/uL (ref 0.7–4.0)
MCH: 25.3 pg — ABNORMAL LOW (ref 26.0–34.0)
MCHC: 31.3 g/dL (ref 30.0–36.0)
MCV: 80.7 fL (ref 80.0–100.0)
Monocytes Absolute: 0.8 10*3/uL (ref 0.1–1.0)
Monocytes Relative: 7 %
Neutro Abs: 8.6 10*3/uL — ABNORMAL HIGH (ref 1.7–7.7)
Neutrophils Relative %: 82 %
Platelets: 290 10*3/uL (ref 150–400)
RBC: 3.84 MIL/uL — ABNORMAL LOW (ref 3.87–5.11)
RDW: 21.9 % — ABNORMAL HIGH (ref 11.5–15.5)
WBC: 10.5 10*3/uL (ref 4.0–10.5)
nRBC: 0 % (ref 0.0–0.2)

## 2020-04-11 IMAGING — DX DG ABD PORTABLE 1V
1 series · 1 of 1 positions shown · non-contrast
Comparison: [DATE].  CT [DATE]

CLINICAL DATA: Abdominal pain, vomiting

EXAM:
PORTABLE ABDOMEN - 1 VIEW

[abdomen kub]
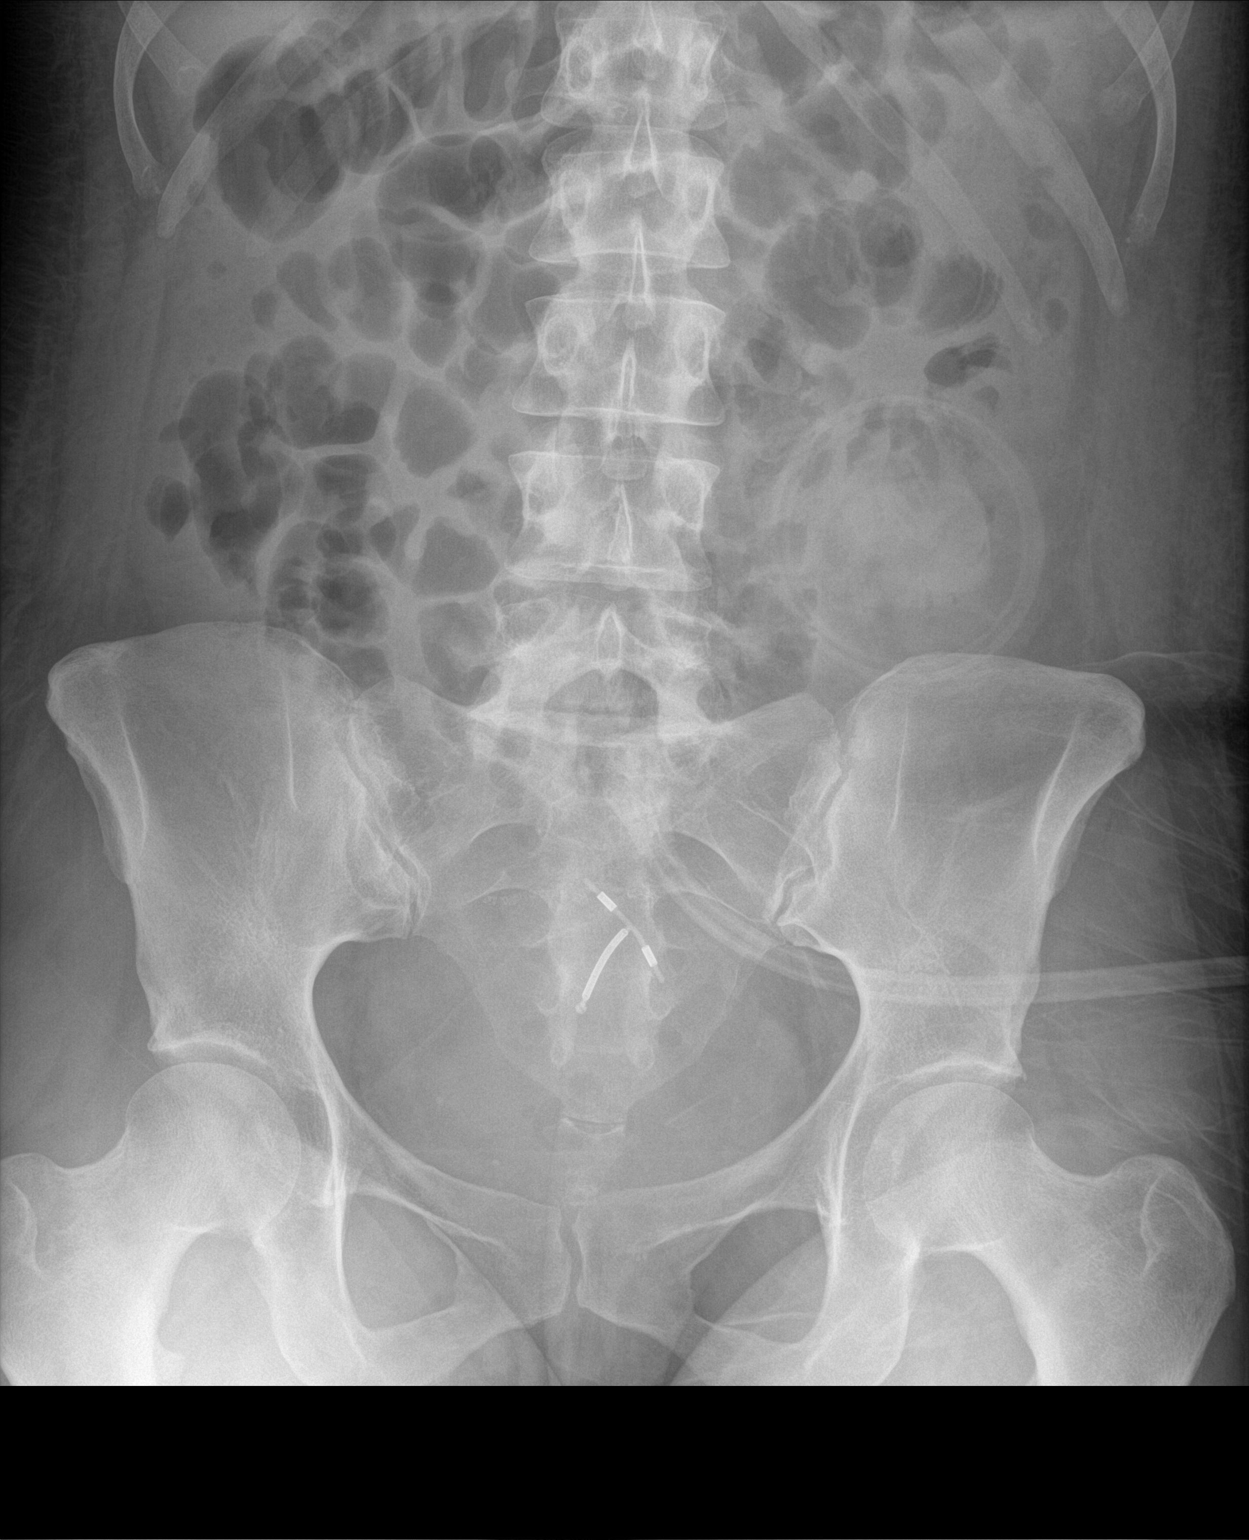

[1 of 1 positions shown; findings below may reference images not displayed]

FINDINGS: Nonobstructive bowel gas pattern. Left lower quadrant ostomy noted.
IUD present in the pelvis. No free air or organomegaly. No
suspicious calcification.
IMPRESSION: Left lower quadrant ostomy noted. No evidence of bowel obstruction.
No acute findings.

## 2020-04-11 MED ORDER — METHOCARBAMOL 500 MG PO TABS
1000.0000 mg | ORAL_TABLET | Freq: Four times a day (QID) | ORAL | Status: DC
  Administered 2020-04-11 – 2020-04-16 (×21): 1000 mg via ORAL
  Filled 2020-04-11 (×21): qty 2

## 2020-04-11 MED ORDER — HYDROMORPHONE HCL 1 MG/ML IJ SOLN
0.5000 mg | Freq: Four times a day (QID) | INTRAMUSCULAR | Status: DC | PRN
  Administered 2020-04-11 (×2): 0.5 mg via INTRAVENOUS
  Filled 2020-04-11 (×2): qty 0.5

## 2020-04-11 MED ORDER — HYDROMORPHONE HCL 1 MG/ML IJ SOLN
1.0000 mg | INTRAMUSCULAR | Status: DC | PRN
  Administered 2020-04-11 – 2020-04-12 (×5): 2 mg via INTRAVENOUS
  Administered 2020-04-12: 1 mg via INTRAVENOUS
  Administered 2020-04-12 – 2020-04-13 (×3): 2 mg via INTRAVENOUS
  Administered 2020-04-13: 1 mg via INTRAVENOUS
  Administered 2020-04-13 – 2020-04-14 (×3): 2 mg via INTRAVENOUS
  Administered 2020-04-14: 1 mg via INTRAVENOUS
  Administered 2020-04-14: 2 mg via INTRAVENOUS
  Administered 2020-04-15 (×2): 1 mg via INTRAVENOUS
  Administered 2020-04-15: 2 mg via INTRAVENOUS
  Administered 2020-04-16 (×2): 1 mg via INTRAVENOUS
  Administered 2020-04-16: 2 mg via INTRAVENOUS
  Filled 2020-04-11: qty 2
  Filled 2020-04-11 (×3): qty 1
  Filled 2020-04-11 (×5): qty 2
  Filled 2020-04-11 (×2): qty 1
  Filled 2020-04-11: qty 2
  Filled 2020-04-11: qty 1
  Filled 2020-04-11 (×8): qty 2

## 2020-04-11 NOTE — Progress Notes (Signed)
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 Underwent sigmoid colectomy and colostomy on 7/7. Hospital course complicated by urinary retention.  Pt was admitted 6/14- 6/18 with abdominal pain and abscess with + liver mass.    OT comments  Patient initially min guard with functional ambulation to bathroom, however patient with tremulous LEs and patient stating LEs feel weak therefore min A for ambulation sink to toilet and back to bed. Patient did stand initially with min G at sink to brush teeth and wash her face ~2 mins. Educated pt on bed level LE exercises and encourage patient to perform throughout the day to maximize strength needed for functional transfers, ambulation and self care.   Follow Up Recommendations  Home health OT;Supervision/Assistance - 24 hour    Equipment Recommendations  3 in 1 bedside commode       Precautions / Restrictions Precautions Precautions: Fall Precaution Comments: abdominal wound vac, colostomy Restrictions Weight Bearing Restrictions: No       Mobility Bed Mobility Overal bed mobility: Needs Assistance Bed Mobility: Rolling;Sidelying to Sit;Sit to Sidelying Rolling: Supervision Sidelying to sit: Min guard     Sit to sidelying: Min assist General bed mobility comments: patient able to perform log roll technique at supervision/min G level except returning to bed require min A to lift LEs onto bed  Transfers Overall transfer level: Needs assistance Equipment used: None Transfers: Sit to/from Stand Sit to Stand: Min assist         General transfer comment: steadying assistance    Balance Overall balance assessment: Needs assistance Sitting-balance support: No upper extremity  supported;Feet supported Sitting balance-Leahy Scale: Good     Standing balance support: No upper extremity supported;During functional activity Standing balance-Leahy Scale: Fair Standing balance comment: min G to min A "my legs feel weak"                           ADL either performed or assessed with clinical judgement   ADL Overall ADL's : Needs assistance/impaired     Grooming: Oral care;Wash/dry hands;Min guard;Standing                   Toilet Transfer: Minimal assistance;Ambulation;Regular Toilet   Toileting- Water quality scientist and Hygiene: Supervision/safety;Sitting/lateral lean;Sit to/from stand       Functional mobility during ADLs: Minimal assistance                 Cognition Arousal/Alertness: Awake/alert Behavior During Therapy: Flat affect Overall Cognitive Status: Within Functional Limits for tasks assessed                                                General Comments educate patient in bed level exercises such as glute squeezes, ankle pumps and quad sets to maximize LE strength necessary for safety during functional tasks/ambulation    Pertinent Vitals/ Pain       Pain Assessment: Faces Faces Pain Scale: Hurts even more Pain Location: R shoulder, back and flank "I think I pulled a muscle" Pain Descriptors / Indicators: Sore Pain Intervention(s): Monitored during session;Heat applied  Frequency  Min 2X/week        Progress Toward Goals  OT Goals(current goals can now be found in the care plan section)  Progress towards OT goals: Progressing toward goals  Acute Rehab OT Goals Patient Stated Goal: return home OT Goal Formulation: With patient Time For Goal Achievement: 04/21/20 Potential to Achieve Goals: Good ADL Goals Pt Will Perform Grooming: with modified independence;standing Pt Will Perform Lower Body Bathing: with modified independence;sit to/from stand Pt Will Perform Lower  Body Dressing: with modified independence;sit to/from stand Pt Will Transfer to Toilet: with modified independence;ambulating;bedside commode (over toilet) Pt Will Perform Toileting - Clothing Manipulation and hygiene: with modified independence;sit to/from stand Pt Will Perform Tub/Shower Transfer: Tub transfer;with supervision;ambulating;3 in 1 Additional ADL Goal #1: Pt will perform bed mobility using log roll technique.  Plan Discharge plan remains appropriate       AM-PAC OT "6 Clicks" Daily Activity     Outcome Measure   Help from another person eating meals?: None Help from another person taking care of personal grooming?: A Little Help from another person toileting, which includes using toliet, bedpan, or urinal?: A Little Help from another person bathing (including washing, rinsing, drying)?: A Little Help from another person to put on and taking off regular upper body clothing?: A Little Help from another person to put on and taking off regular lower body clothing?: A Little 6 Click Score: 19    End of Session  OT Visit Diagnosis: Unsteadiness on feet (R26.81);Pain;Muscle weakness (generalized) (M62.81) Pain - Right/Left: Right Pain - part of body: Shoulder (back, side)   Activity Tolerance Patient limited by pain;Other (comment) (returned to bed d/t needing XR)   Patient Left in bed;with call bell/phone within reach   Nurse Communication Mobility status        Time: 1779-3903 OT Time Calculation (min): 16 min  Charges: OT General Charges $OT Visit: 1 Visit OT Treatments $Self Care/Home Management : 8-22 mins  Delbert Phenix OT Pager: [place] 04/11/2020, 12:56 PM

## 2020-04-11 NOTE — Consult Note (Signed)
[place] Nurse wound follow up Dr. Kieth Brightly is in to see patient at the time of my assessment. Wound type:Surgical  Measurement:per Monday Wound PQZ:RAQT pink, dry Drainage (amount, consistency, odor) Small amount serosanguinous Periwound: 2.5cm x 1cm area at umbilicus where MARSI occurred is skin level and red. This is again protected with a portion of and flattened skin barrier ring as a protectant  Dressing procedure/placement/frequency: Patient is premedicated with Dilaudid 30 minutes prior to dressing change.  Dressing removed. Periwound skin is protected with strips of drape.  A portion of and flattened skin barrier ring is used to enhance seal and protect the most distal portion of the wound from 5-7 o'clock.  [facility] PA L. Simaan in to remove JP drain and photograph wound and stoma.   One (1) piece of black foam is used to obliterate the dead space. Dressing is secured with drape and attached to 145mmHg continuous negative pressure. An immediate seal is achieved and patient tolerated the procedure well.   [place] Nurse ostomy follow up Stoma type/location: LLQ Colosotmy Stomal assessment/size: 1 and 1/4 x 1 and 1/2 inch oval stoma, above skin level. Dry. Lumen at center. Peristomal assessment: With 5cm x 2cm area of MARSI improving. Today I will use a smaller skin barrier/pouching system and cover the areas with a silicone foam dressing. Treatment options for stomal/peristomal skin: See above Output: Green/brown effluent. Liquid. Ostomy pouching: 2pc. Flat 2 and 1/4 inch system with skin barrier ring used today Education provided: Patient is overwhelmed with Dr. Amie [place] discussion of the port-a-cath recommendation prior to discharge and the need to proceed with Oncology recommendations.  She reports a new pain in her right shoulder/back and an Research officer, political party is outside the room to perform a diagnostic procedure. She just had a JP pulled. She endorses being able to empty the pouch and close the  tail portion of the pouch using the Lock and Roll closure mechanism.   HHRN is to work with patient at home given the Orders for home NPWT.  Continuing ostomy education both from that RN as well as the [facility] personnel will be invaluable.  Enrolled patient in [facility] Discharge program: Yes, previously.  [place] nursing team will follow, and will remain available to this patient, the nursing and medical teams.  Next VAC dressing change and ostomy education session will be on Friday, 04/13/20.  Thanks, Maudie Flakes, MSN, RN, [place], Arther Abbott  Pager# [phone]

## 2020-04-11 NOTE — Progress Notes (Signed)
Progress Note: General Surgery Service   Chief Complaint/Subjective: Overall feels she is improving. Reports feeling sore and feeling like she pulled a muscle in her right lateral chest wall - pain is worse with movement. Emesis x1 yesterday. +gas/stool in colostomy pouch. Has been getting up to the bathroom to void. Did not mobilize in hall or work with PT yesterday.  Objective: Vital signs in last 24 hours: Temp:  [98 F (36.7 C)-98.5 F (36.9 C)] 98 F (36.7 C) (07/14 0610) Pulse Rate:  [66-68] 68 (07/14 0610) Resp:  [15-16] 15 (07/14 0610) BP: (127-129)/(82-87) 129/87 (07/14 0610) SpO2:  [98 %] 98 % (07/14 0610) Last BM Date: 04/10/20  Intake/Output from previous day: 07/13 0701 - 07/14 0700 In: 2780 [P.O.:2780] Out: 1175 [Urine:925; Emesis/NG output:100; Stool:150] Intake/Output this shift: No intake/output data recorded.  Gen: NAD  Resp: nonlabored  Card: RRR  Abd: soft, vac in place, ostomy with some gas/ stool in pouch, JP with SS drainage   Lab Results: CBC  Recent Labs    04/10/20 1320 04/11/20 0513  WBC 11.5* 10.5  HGB 9.6* 9.7*  HCT 30.8* 31.0*  PLT 292 290   BMET Recent Labs    04/10/20 0506 04/11/20 0513  NA 138 136  K 3.8 3.5  CL 104 103  CO2 23 22  GLUCOSE 83 85  BUN <5* <5*  CREATININE 0.48 0.37*  CALCIUM 7.8* 7.9*   PT/INR No results for input(s): LABPROT, INR in the last 72 hours. ABG No results for input(s): PHART, HCO3 in the last 72 hours.  Invalid input(s): PCO2, PO2  Anti-infectives: Anti-infectives (From admission, onward)   Start     Dose/Rate Route Frequency Ordered Stop   04/10/20 1330  amoxicillin-clavulanate (AUGMENTIN) 875-125 MG per tablet 1 tablet     Discontinue     1 tablet Oral Every 12 hours 04/10/20 1218 04/22/20 0959   04/08/20 1400  vancomycin (VANCOCIN) IVPB 1000 mg/200 mL premix  Status:  Discontinued        1,000 mg 200 mL/hr over 60 Minutes Intravenous Every 8 hours 04/08/20 1136 04/08/20 1322    04/08/20 0000  vancomycin (VANCOREADY) IVPB 750 mg/150 mL  Status:  Discontinued        750 mg 150 mL/hr over 60 Minutes Intravenous Every 8 hours 04/07/20 1628 04/08/20 1136   04/07/20 1645  vancomycin (VANCOREADY) IVPB 1500 mg/300 mL        1,500 mg 150 mL/hr over 120 Minutes Intravenous STAT 04/07/20 1638 04/07/20 1847   04/07/20 1630  vancomycin (VANCOREADY) IVPB 1500 mg/300 mL  Status:  Discontinued        1,500 mg 150 mL/hr over 120 Minutes Intravenous  Once 04/07/20 1627 04/07/20 1628   04/04/20 1045  cefoTEtan (CEFOTAN) 2 g in sodium chloride 0.9 % 100 mL IVPB        2 g 200 mL/hr over 30 Minutes Intravenous On call to O.R. 04/03/20 1639 04/04/20 1213   03/31/20 2000  ceFEPIme (MAXIPIME) 2 g in sodium chloride 0.9 % 100 mL IVPB  Status:  Discontinued        2 g 200 mL/hr over 30 Minutes Intravenous Every 8 hours 03/31/20 1408 04/10/20 0917   03/31/20 2000  metroNIDAZOLE (FLAGYL) IVPB 500 mg  Status:  Discontinued        500 mg 100 mL/hr over 60 Minutes Intravenous Every 8 hours 03/31/20 1410 04/10/20 0917   03/31/20 1600  ceFEPIme (MAXIPIME) 2 g in sodium chloride 0.9 % 100  mL IVPB  Status:  Discontinued        2 g 200 mL/hr over 30 Minutes Intravenous  Once 03/31/20 1549 03/31/20 1551   03/31/20 1200  piperacillin-tazobactam (ZOSYN) IVPB 3.375 g        3.375 g 100 mL/hr over 30 Minutes Intravenous  Once 03/31/20 1159 03/31/20 1341      Medications: Scheduled Meds: . acetaminophen  1,000 mg Oral Q8H  . amoxicillin-clavulanate  1 tablet Oral Q12H  . Chlorhexidine Gluconate Cloth  6 each Topical Daily  . enoxaparin (LOVENOX) injection  40 mg Subcutaneous Q24H  . famotidine  20 mg Oral BID  . methocarbamol  1,000 mg Oral QID   Continuous Infusions:  PRN Meds:.HYDROmorphone (DILAUDID) injection, ondansetron **OR** ondansetron (ZOFRAN) IV, oxyCODONE, prochlorperazine, traMADol  Assessment/Plan: s/p Procedure(s): EXPLORATORY LAPAROTOMY CYSTOSCOPY WITH STENT PLACEMENT  04/04/2020 POD#7 - path: Invasive moderately differentiated adenocarcinoma, 1/15 nodes positive  - Afebrile, VSS, WBC downtrending - having bowel function, continue SOFT diet. - check KUB given recurrent emesis, low suspicion for obstruction or ileus given exam and having bowel function. - Mobilize - PO Pain control, I added schedueld robaxin and decreased her IV HM, encourage PO pain control - Abx per ID - NPWT M/W/F, HH VAC ordered -pt to F/U with Dr. Benay Spice [facility]) and patient will get follow up with his group in the office.patient will need port placement in the near future - will discuss timing with MD.    FEN: SOFT ID: flagyl, maxipime stopped 7/13, Augmentin 7/13 >>  VTE: SCD's, lovenox Foley: replaced 7/9 for retention, removed 7/13, now voiding without issue, D/C urecholine     LOS: 11 days   Jill Alexanders, PA-C [facility]

## 2020-04-11 NOTE — Evaluation (Addendum)
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 Underwent sigmoid colectomy and colostomy on 7/7. Hospital course complicated by urinary retention.  Pt was admitted 6/14- 6/18 with abdominal pain and abscess with + liver mass.   Clinical Impression  Patient assisted to ambulate in room to bathroom. Patient reports intermittent sharp pain on right lateral chest wall and mid thoracic. Reports difficulty taking a deep breath. Patient tearful. Encouraged pursed lip breaths and use of small pillow against side to cough. Encouraged IS. Stated: I can't use it today ". Pt admitted with above diagnosis.  Pt currently with functional limitations due to the deficits listed below (see PT Problem List). Pt will benefit from skilled PT to increase their independence and safety with mobility to allow discharge to the venue listed below.       Follow Up Recommendations Home health PT    Equipment Recommendations  Rolling walker with 5" wheels - may not need   Recommendations for Other Services       Precautions / Restrictions Precautions Precautions: Fall Precaution Comments: abdominal wound vac, colostomy      Mobility  Bed Mobility   Bed Mobility: Rolling;Sidelying to Sit;Sit to Sidelying Rolling: Supervision Sidelying to sit: Min guard     Sit to sidelying: Min assist General bed mobility comments: assist legs onto bed  Transfers Overall transfer level: Needs assistance Equipment used: 1 person hand held assist Transfers: Sit to/from Stand Sit to Stand: Min assist         General transfer comment: steadying assistance  Ambulation/Gait Ambulation/Gait assistance: Min assist Gait Distance (Feet): 20 Feet (x 2) Assistive device: IV  Pole;1 person hand held assist Gait Pattern/deviations: Step-through pattern;Step-to pattern Gait velocity: decr   General Gait Details: holding to walls, IV pole and HHA. stops intermittently with  C/O sharp pain on right side  Stairs            Wheelchair Mobility    Modified Rankin (Stroke Patients Only)       Balance Overall balance assessment: Needs assistance Sitting-balance support: No upper extremity supported;Feet supported Sitting balance-Leahy Scale: Good     Standing balance support: During functional activity;Single extremity supported Standing balance-Leahy Scale: Fair Standing balance comment: min G to min A "my legs feel weak"                             Pertinent Vitals/Pain Faces Pain Scale: Hurts worst Pain Location: right mid chest around to back-thoracic Pain Descriptors / Indicators: Contraction;Sharp;Restless Pain Intervention(s): Monitored during session;Patient requesting pain meds-RN notified;Limited activity within patient's tolerance;Repositioned (provided small pillow to splint right side\)    Home Living Family/patient expects to be discharged to:: Private residence Living Arrangements: Children Available Help at Discharge: Family;Available PRN/intermittently Type of Home: House Home Access: Stairs to enter Entrance Stairs-Rails: Right Entrance Stairs-Number of Steps: 2 Home Layout: One level Home Equipment: None      Prior Function Level of Independence: Independent         Comments: pt was a hair stylist formerly, 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     Hand Dominance   Dominant Hand: Right    Extremity/Trunk Assessment   Upper Extremity Assessment Upper Extremity  Assessment: Defer to OT evaluation    Lower Extremity Assessment Lower Extremity Assessment: Generalized weakness    Cervical / Trunk Assessment Cervical / Trunk Assessment: Other exceptions Cervical / Trunk Exceptions:  abdominal pain/incision/colostomy  Communication   Communication: No difficulties  Cognition   Behavior During Therapy: Flat affect;Anxious Overall Cognitive Status: Within Functional Limits for tasks assessed                                 General Comments: emotional labile, with increased sharp chest/back pains      General Comments      Exercises Other Exercises Other Exercises: pursed lip breaths   Assessment/Plan    PT Assessment Patient needs continued PT services  PT Problem List Decreased strength;Decreased activity tolerance;Decreased mobility;Pain;Decreased knowledge of use of DME;Decreased knowledge of precautions       PT Treatment Interventions DME instruction;Therapeutic activities;Gait training;Functional mobility training;Therapeutic exercise;Patient/family education    PT Goals (Current goals can be found in the Care Plan section)  Acute Rehab PT Goals Patient Stated Goal: to not have pain. go home PT Goal Formulation: With patient Time For Goal Achievement: 04/25/20 Potential to Achieve Goals: Good    Frequency Min 3X/week   Barriers to discharge Decreased caregiver support      Co-evaluation               AM-PAC PT "6 Clicks" Mobility  Outcome Measure Help needed turning from your back to your side while in a flat bed without using bedrails?: A Little Help needed moving from lying on your back to sitting on the side of a flat bed without using bedrails?: A Little Help needed moving to and from a bed to a chair (including a wheelchair)?: A Lot Help needed standing up from a chair using your arms (e.g., wheelchair or bedside chair)?: A Lot Help needed to walk in hospital room?: A Lot Help needed climbing 3-5 steps with a railing? : A Lot 6 Click Score: 14    End of Session   Activity Tolerance: Patient limited by pain Patient left: in bed;with call bell/phone within reach Nurse Communication: Mobility status PT Visit  Diagnosis: Unsteadiness on feet (R26.81);Muscle weakness (generalized) (M62.81);Difficulty in walking, not elsewhere classified (R26.2)    Time: 3295-1884 PT Time Calculation (min) (ACUTE ONLY): 18 min   Charges:   PT Evaluation $PT Eval Low Complexity: [place] PT Acute Rehabilitation Services Pager [phone] Office [phone]   Claretha Cooper 04/11/2020, 4:03 PM

## 2020-04-11 NOTE — Progress Notes (Signed)
PROGRESS NOTE    Stephanie [name]  JAS:[id number] DOB: 30-Jun-1981 DOA: 03/31/2020 PCP: Patient, [name]   Chief Complaint  Patient presents with  . Abdominal Pain    Brief Narrative: Stephanie [name] 39 y.o.femalewith medical history significant ofrecent admission from June 14 to June 18 with abdominal pain and abscess with Stephanie [name] question of sigmoidcolon malignancy versus diverticulitis. At that time she had an abnormal CT scan which also showed Stephanie [name] liver mass. She had Stephanie [name] barely elevated CEA at 4.9. She was placed on IV antibiotics, an attempt was made at liver biopsy however ultrasound suggested it might be Stephanie [name] hemangioma. An MRI was equivocal. She was sent home on Augmentin which she completed and was doing well with until the last few days. She has continued to have blood in her stool. Stools are soft and not fully formed. She is passing gas. She has had increasing nausea with emesis over the last 12 hours. She reports ongoing lower abdominal cramping that is worse in the left lower quadrant. Her pain was 10 out of 10 requiring IV Dilaudid in the ED. She has an IUD in place, no sexual partner for the last 1 year. She denies vaginal discharge, vaginal bleeding, dysuria, hematuria, chest pain, or shortness of breath. In the ED she was sent for CT of the abdomen and pelvis which showed enlarging sigmoid colonic mass, which appeared which appears to be associated with focal perforation and has fistulized into the left adnexal region involving the left TOA. There are multiple enlarged lymph nodes which are bigger than before measuring up to 1.2 cm. The previously suspected metastatic lesion in the liver is also enlarged, small volume ascites. Patient placed empirically on IV antibiotics.  GI and general surgery were consulted.  Patient for exploratory laparotomy and partial colectomy with urethral stent placement today 04/04/2020 per general surgery. Patient with some complaints of  shortness of breath the morning of 04/04/2020, concern for volume overload and as such chest x-ray obtained and patient given Stephanie [name] dose of IV Lasix, IV fluids discontinued.  She was admitted for abdominal pain, nausea, and vomiting and had imaging findings concerning for colonic mass with metastatic disease with tubo-ovarian abscess and focally contained perforation.  She's now s/p surgery with colectomy and end colostomy.  Cultures growing staph epidermidis.  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  Previously suspected mestatic lesion in segment 7 of the liver has enlarged.   General surgery consulted - now s/p colectomy and end colostomy on 04/04/20 (findings at surgery concerning for sigmoid colon cancer with pelvic abscess)- continue antibiotics Biopsies c/w invasive moderately differentiated adenocarcinoma, 6 cm, involving rectosigmoid junction.  Carcinoma invades into serosal surface with perforation and associated serositis.  Radial resection margin is positive for carcinoma; proximal and distal margins are not involved.  Lymphovascular invasion is present.  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  One tumor deposit.  (mesenteric LN, resection) Metastatic adenocarcinoma to 1 of 6 LN's, 1 tumor deposit. Oncology c/s - per note from 7/9, will arrange f/u with GI oncologist outpatient  ID c/s - recommending augmentin to treat for 2 weeks of abx from 7/11 when ready for d/c (has been narrowed to augmentin per surgery)  Abscess with staph epidermidis - pan sensitive Soft diet per surgery  3.  Shortness of breath Secondary to volume overload. CXR from 7/7 with LLL retrocardiac collapse/consolidation and small L effusion Improved with lasix Continue to follow I/O, daily weights   4.  Nausea/vomiting/abdominal pain.   Due to sigmoid colon cancer with pelvic abscess Continue analgesia/antiemetics prn     5.  Acute blood loss anemia/chronic iron deficiency anemia Patient noted to have some scant bright red blood per rectum noted on previous exam.  Hemoglobin stable around 9  GI saw earlier in hospitalization prior to surgery and at that time planned for colonoscopy in 6-8 weeks -> follow up with oncology and GI outpatient    6.  Moderate protein calorie malnutrition Supplements ordered Continue soft diet  7.  Hypokalemia Improved, follow   8. Urinary retention Foley catheter discontinued per general surgery on 04/05/2020 however patient noted to have urinary retention and foley was replaced Doing well without foley   DVT prophylaxis: lovenox Code Status: full  Family Communication: none at bedside Disposition:   Status is: Inpatient  Remains inpatient appropriate because:Inpatient level of care appropriate due to severity of illness   Dispo: The patient is from: Home              Anticipated d/c is to: pending              Anticipated d/c date is: > 3 days              Patient currently is not medically stable to d/c.   Consultants:   Surgery  Urology  ID  Oncology  Gastroenterology  Procedures:  Open sigmoid colectomy and end colostomy 7/7  by general surgery Cystoscopy, retrograde pyelogram with interpretation, bilateral temporary ureteral stent placement 7/7 by urology  Antimicrobials: Anti-infectives (From admission, onward)   Start     Dose/Rate Route Frequency Ordered Stop   04/10/20 1330  amoxicillin-clavulanate (AUGMENTIN) 875-125 MG per tablet 1 tablet     Discontinue     1 tablet Oral Every 12 hours 04/10/20 1218 04/22/20 0959   04/08/20 1400  vancomycin (VANCOCIN) IVPB 1000 mg/200 mL premix  Status:  Discontinued        1,000 mg 200 mL/hr over 60 Minutes Intravenous Every 8 hours 04/08/20 1136 04/08/20 1322   04/08/20 0000  vancomycin (VANCOREADY) IVPB 750 mg/150 mL  Status:  Discontinued        750 mg 150 mL/hr over 60 Minutes Intravenous Every 8 hours 04/07/20 1628 04/08/20 1136   04/07/20 1645  vancomycin (VANCOREADY) IVPB 1500 mg/300 mL        1,500 mg 150 mL/hr over 120 Minutes Intravenous STAT 04/07/20 1638 04/07/20 1847   04/07/20 1630  vancomycin (VANCOREADY) IVPB 1500 mg/300 mL  Status:  Discontinued        1,500 mg 150 mL/hr over 120 Minutes Intravenous  Once 04/07/20 1627 04/07/20 1628   04/04/20 1045  cefoTEtan (CEFOTAN) 2 g in sodium  chloride 0.9 % 100 mL IVPB        2 g 200 mL/hr over 30 Minutes Intravenous On call to O.R. 04/03/20 1639 04/04/20 1213   03/31/20 2000  ceFEPIme (MAXIPIME) 2 g in sodium chloride 0.9 % 100 mL IVPB  Status:  Discontinued        2 g 200 mL/hr over 30 Minutes Intravenous Every 8 hours 03/31/20 1408 04/10/20 0917   03/31/20 2000  metroNIDAZOLE (FLAGYL) IVPB 500 mg  Status:  Discontinued        500 mg 100 mL/hr over 60 Minutes Intravenous Every 8 hours 03/31/20 1410 04/10/20 0917   03/31/20 1600  ceFEPIme (MAXIPIME) 2 g in sodium chloride 0.9 % 100 mL IVPB  Status:  Discontinued        2 g 200 mL/hr over 30 Minutes Intravenous  Once 03/31/20 1549 03/31/20 1551   03/31/20 1200  piperacillin-tazobactam (ZOSYN) IVPB 3.375 g        3.375 g 100 mL/hr over 30 Minutes  Intravenous  Once 03/31/20 1159 03/31/20 1341         Subjective: Some muscular pain today, she thinks from turning quickly   Objective: Vitals:   04/09/20 2204 04/10/20 0547 04/10/20 2206 04/11/20 0610  BP: 121/78 126/79 127/82 129/87  Pulse: 67 62 66 68  Resp: 15 15 16 15   Temp: 97.9 F (36.6 C) 97.7 F (36.5 C) 98.5 F (36.9 C) 98 F (36.7 C)  TempSrc: Oral Oral Oral Oral  SpO2: 97% 97% 98% 98%  Weight:      Height:        Intake/Output Summary (Last 24 hours) at 04/11/2020 1227 Last data filed at 04/11/2020 0845 Gross per 24 hour  Intake 3020 ml  Output 500 ml  Net 2520 ml   Filed Weights   04/08/20 0528 04/08/20 1132 04/09/20 0642  Weight: 85.9 kg 82.9 kg 83.1 kg    Examination:  General exam: Appears calm and comfortable  Respiratory system: Clear to auscultation. Respiratory effort normal. Cardiovascular system: S1 & S2 heard, RRR. Gastrointestinal system: dressing in place, ostomy  Central nervous system: Alert and oriented. No focal neurological deficits. Extremities: no LEE Skin: No rashes, lesions or ulcers Psychiatry: Judgement and insight appear normal. Mood & affect appropriate.     Data Reviewed: I have personally reviewed following labs and imaging studies  CBC: Recent Labs  Lab 04/07/20 0513 04/07/20 0513 04/08/20 0513 04/09/20 0527 04/10/20 0801 04/10/20 1320 04/11/20 0513  WBC 7.6   < > 13.5* 12.7* 10.7* 11.5* 10.5  NEUTROABS 5.8  --   --  10.7* 8.5* 9.5* 8.6*  HGB 8.9*   < > 9.5* 9.7* 9.7* 9.6* 9.7*  HCT 28.7*   < > 30.1* 31.2* 31.1* 30.8* 31.0*  MCV 82.0   < > 80.5 81.3 81.2 81.1 80.7  PLT 215   < > 240 233 285 292 290   < > = values in this interval not displayed.    Basic Metabolic Panel: Recent Labs  Lab 04/06/20 0430 04/06/20 0430 04/07/20 0513 04/08/20 0513 04/09/20 0527 04/10/20 0506 04/11/20 0513  NA 141   < > 137 135 136 138 136  K 3.3*   < > 3.2* 4.1 3.8 3.8 3.5  CL 108   < > 105 102 104 104 103  CO2 26    < > 25 22 23 23 22   GLUCOSE 117*   < > 80 73 87 83 85  BUN 19   < >  12 6 <5* <5* <5*  CREATININE 0.44   < > 0.44 0.44 0.47 0.48 0.37*  CALCIUM 8.0*   < > 7.4* 7.5* 7.7* 7.8* 7.9*  MG 2.0  --  1.9  --  1.8 2.0  --    < > = values in this interval not displayed.    GFR: Estimated Creatinine Clearance: 108.8 mL/min (Hayla Hinger) (by C-G formula based on SCr of 0.37 mg/dL (L)).  Liver Function Tests: Recent Labs  Lab 04/05/20 0458 04/06/20 0430  AST 11* 14*  ALT 8 8  ALKPHOS 47 39  BILITOT 1.0 0.5  PROT 5.0* 4.9*  ALBUMIN 1.9* 1.9*    CBG: No results for input(s): GLUCAP in the last 168 hours.   Recent Results (from the past 240 hour(s))  Surgical PCR screen     Status: None   Collection Time: 04/03/20  4:52 PM   Specimen: Nasal Mucosa; Nasal Swab  Result Value Ref Range Status   MRSA, PCR NEGATIVE NEGATIVE Final   Staphylococcus aureus NEGATIVE NEGATIVE Final    Comment: (NOTE) The Xpert SA Assay (FDA approved for NASAL specimens in patients 56 years of age and older), is one component of [facility] comprehensive surveillance program. It is not intended to diagnose infection nor to guide or monitor treatment. Performed at [facility], [place] [address], [place], [place] [address]   Aerobic/Anaerobic Culture (surgical/deep wound)     Status: None   Collection Time: 04/04/20 12:46 PM   Specimen: Abdomen; Abscess  Result Value Ref Range Status   Specimen Description   Final    ABDOMEN PERITONEAL ABSCESS Performed at [facility] [address] [place], [place] [address]    Special Requests   Final    NONE Performed at [facility], [place] [address], [place], [place] [address]    Gram Stain   Final    RARE WBC PRESENT, PREDOMINANTLY MONONUCLEAR NO ORGANISMS SEEN    Culture   Final    RARE STAPHYLOCOCCUS EPIDERMIDIS NO ANAEROBES ISOLATED Performed at [facility], [place] [address], [place], [place] [address]    Report  Status 04/09/2020 FINAL  Final   Organism ID, Bacteria STAPHYLOCOCCUS EPIDERMIDIS  Final      Susceptibility   Staphylococcus epidermidis - MIC*    CIPROFLOXACIN <=0.5 SENSITIVE Sensitive     ERYTHROMYCIN <=0.25 SENSITIVE Sensitive     GENTAMICIN <=0.5 SENSITIVE Sensitive     OXACILLIN <=0.25 SENSITIVE Sensitive     TETRACYCLINE 2 SENSITIVE Sensitive     VANCOMYCIN 2 SENSITIVE Sensitive     TRIMETH/SULFA <=10 SENSITIVE Sensitive     CLINDAMYCIN <=0.25 SENSITIVE Sensitive     RIFAMPIN <=0.5 SENSITIVE Sensitive     Inducible Clindamycin NEGATIVE Sensitive     * RARE STAPHYLOCOCCUS EPIDERMIDIS         Radiology Studies: DG Abd Portable 1V  Result Date: 04/11/2020 CLINICAL DATA:  Abdominal pain, vomiting EXAM: PORTABLE ABDOMEN - 1 VIEW COMPARISON:  12/26/2016.  CT 03/31/2020 FINDINGS: Nonobstructive bowel gas pattern. Left lower quadrant ostomy noted. IUD present in the pelvis. No free air or organomegaly. No suspicious calcification. IMPRESSION: Left lower quadrant ostomy noted. No evidence of bowel obstruction. No acute findings. Electronically Signed   By: Rolm Baptise M.D.   On: 04/11/2020 11:05        Scheduled Meds: . acetaminophen  1,000 mg Oral Q8H  . amoxicillin-clavulanate  1 tablet Oral Q12H  . Chlorhexidine Gluconate Cloth  6  each Topical Daily  . enoxaparin (LOVENOX) injection  40 mg Subcutaneous Q24H  . famotidine  20 mg Oral BID  . methocarbamol  1,000 mg Oral QID   Continuous Infusions:   LOS: 11 days    Time spent: over 30 min    Fayrene Helper, MD [facility]   To contact the attending provider between 7A-7P or the covering provider during after hours 7P-7A, please log into the web site [email or web address] and access using universal [place] password for that web site. If you do not have the password, please call the hospital operator.  04/11/2020, 12:27 PM

## 2020-04-12 ENCOUNTER — Inpatient Hospital Stay (HOSPITAL_COMMUNITY): Payer: Medicaid Other | Admitting: Certified Registered Nurse Anesthetist

## 2020-04-12 ENCOUNTER — Encounter (HOSPITAL_COMMUNITY)
Admission: EM | Disposition: A | Discharge status: Home Health | Payer: Self-pay | Source: Home / Self Care | Attending: Internal Medicine

## 2020-04-12 ENCOUNTER — Inpatient Hospital Stay (HOSPITAL_COMMUNITY): Payer: Medicaid Other

## 2020-04-12 HISTORY — PX: PORTACATH PLACEMENT: SHX2246

## 2020-04-12 LAB — COMPREHENSIVE METABOLIC PANEL
ALT: 14 U/L (ref 0–44)
AST: 26 U/L (ref 15–41)
Albumin: 2.3 g/dL — ABNORMAL LOW (ref 3.5–5.0)
Alkaline Phosphatase: 48 U/L (ref 38–126)
Anion gap: 12 (ref 5–15)
BUN: <5 mg/dL — ABNORMAL LOW (ref 6–20)
CO2: 24 mmol/L (ref 22–32)
Calcium: 8 mg/dL — ABNORMAL LOW (ref 8.9–10.3)
Chloride: 101 mmol/L (ref 98–111)
Creatinine, Ser: 0.41 mg/dL — ABNORMAL LOW (ref 0.44–1.00)
GFR calc Af Amer: >60 mL/min (ref >60)
GFR calc non Af Amer: >60 mL/min (ref >60)
Glucose, Bld: 91 mg/dL (ref 70–99)
Potassium: 3.4 mmol/L — ABNORMAL LOW (ref 3.5–5.1)
Sodium: 137 mmol/L (ref 135–145)
Total Bilirubin: 0.7 mg/dL (ref 0.3–1.2)
Total Protein: 5.7 g/dL — ABNORMAL LOW (ref 6.5–8.1)

## 2020-04-12 LAB — PHOSPHORUS: Phosphorus: 3.8 mg/dL (ref 2.5–4.6)

## 2020-04-12 LAB — CBC WITH DIFFERENTIAL/PLATELET
Abs Immature Granulocytes: 0.16 10*3/uL — ABNORMAL HIGH (ref 0.00–0.07)
Basophils Absolute: 0 10*3/uL (ref 0.0–0.1)
Basophils Relative: 0 %
Eosinophils Absolute: 0.1 10*3/uL (ref 0.0–0.5)
Eosinophils Relative: 1 %
HCT: 31.6 % — ABNORMAL LOW (ref 36.0–46.0)
Hemoglobin: 9.8 g/dL — ABNORMAL LOW (ref 12.0–15.0)
Immature Granulocytes: 2 %
Lymphocytes Relative: 9 %
Lymphs Abs: 0.9 10*3/uL (ref 0.7–4.0)
MCH: 25.3 pg — ABNORMAL LOW (ref 26.0–34.0)
MCHC: 31 g/dL (ref 30.0–36.0)
MCV: 81.7 fL (ref 80.0–100.0)
Monocytes Absolute: 0.9 10*3/uL (ref 0.1–1.0)
Monocytes Relative: 9 %
Neutro Abs: 7.7 10*3/uL (ref 1.7–7.7)
Neutrophils Relative %: 79 %
Platelets: 366 10*3/uL (ref 150–400)
RBC: 3.87 MIL/uL (ref 3.87–5.11)
RDW: 22 % — ABNORMAL HIGH (ref 11.5–15.5)
WBC: 9.8 10*3/uL (ref 4.0–10.5)
nRBC: 0 % (ref 0.0–0.2)

## 2020-04-12 LAB — MAGNESIUM: Magnesium: 1.7 mg/dL (ref 1.7–2.4)

## 2020-04-12 SURGERY — INSERTION, TUNNELED CENTRAL VENOUS DEVICE, WITH PORT
Anesthesia: General | Site: Chest | Laterality: Right

## 2020-04-12 MED ORDER — SODIUM CHLORIDE 0.9 % IV SOLN
Freq: Once | INTRAVENOUS | Status: AC
  Administered 2020-04-12: 500 mL
  Filled 2020-04-12: qty 1.2

## 2020-04-12 MED ORDER — CEFAZOLIN SODIUM-DEXTROSE 2-4 GM/100ML-% IV SOLN
INTRAVENOUS | Status: AC
  Filled 2020-04-12: qty 100

## 2020-04-12 MED ORDER — MEPERIDINE HCL 50 MG/ML IJ SOLN: 6.2500 mg | INTRAMUSCULAR | Status: DC | PRN

## 2020-04-12 MED ORDER — ONDANSETRON HCL 4 MG/2ML IJ SOLN
INTRAMUSCULAR | Status: AC
  Filled 2020-04-12: qty 2

## 2020-04-12 MED ORDER — ROCURONIUM BROMIDE 10 MG/ML (PF) SYRINGE
PREFILLED_SYRINGE | INTRAVENOUS | Status: AC
  Filled 2020-04-12: qty 10

## 2020-04-12 MED ORDER — BUPIVACAINE-EPINEPHRINE 0.25% -1:200000 IJ SOLN
INTRAMUSCULAR | Status: DC | PRN
  Administered 2020-04-12: 20 mL

## 2020-04-12 MED ORDER — FENTANYL CITRATE (PF) 100 MCG/2ML IJ SOLN
INTRAMUSCULAR | Status: DC | PRN
  Administered 2020-04-12 (×3): 50 ug via INTRAVENOUS

## 2020-04-12 MED ORDER — FENTANYL CITRATE (PF) 100 MCG/2ML IJ SOLN
25.0000 ug | INTRAMUSCULAR | Status: DC | PRN
  Administered 2020-04-12: 50 ug via INTRAVENOUS

## 2020-04-12 MED ORDER — PROPOFOL 10 MG/ML IV BOLUS
INTRAVENOUS | Status: DC | PRN
  Administered 2020-04-12: 130 mg via INTRAVENOUS

## 2020-04-12 MED ORDER — CEFAZOLIN SODIUM-DEXTROSE 2-4 GM/100ML-% IV SOLN
2.0000 g | INTRAVENOUS | Status: AC
  Administered 2020-04-12: 2 g via INTRAVENOUS

## 2020-04-12 MED ORDER — LIDOCAINE 2% (20 MG/ML) 5 ML SYRINGE
INTRAMUSCULAR | Status: AC
  Filled 2020-04-12: qty 5

## 2020-04-12 MED ORDER — LACTATED RINGERS IV SOLN: INTRAVENOUS | Status: DC

## 2020-04-12 MED ORDER — PROPOFOL 10 MG/ML IV BOLUS
INTRAVENOUS | Status: AC
  Filled 2020-04-12: qty 20

## 2020-04-12 MED ORDER — HEPARIN SOD (PORK) LOCK FLUSH 100 UNIT/ML IV SOLN
INTRAVENOUS | Status: AC
  Filled 2020-04-12: qty 5

## 2020-04-12 MED ORDER — HEPARIN SOD (PORK) LOCK FLUSH 100 UNIT/ML IV SOLN
INTRAVENOUS | Status: DC | PRN
  Administered 2020-04-12: 500 [IU] via INTRAVENOUS

## 2020-04-12 MED ORDER — LIDOCAINE 2% (20 MG/ML) 5 ML SYRINGE
INTRAMUSCULAR | Status: DC | PRN
  Administered 2020-04-12: 100 mg via INTRAVENOUS

## 2020-04-12 MED ORDER — PROMETHAZINE HCL 25 MG/ML IJ SOLN: 6.2500 mg | INTRAMUSCULAR | Status: DC | PRN

## 2020-04-12 MED ORDER — CHLORHEXIDINE GLUCONATE 0.12 % MT SOLN
15.0000 mL | Freq: Once | OROMUCOSAL | Status: AC
  Administered 2020-04-12: 15 mL via OROMUCOSAL

## 2020-04-12 MED ORDER — BUPIVACAINE-EPINEPHRINE (PF) 0.25% -1:200000 IJ SOLN
INTRAMUSCULAR | Status: AC
  Filled 2020-04-12: qty 30

## 2020-04-12 MED ORDER — FENTANYL CITRATE (PF) 100 MCG/2ML IJ SOLN
INTRAMUSCULAR | Status: AC
  Filled 2020-04-12: qty 2

## 2020-04-12 MED ORDER — MIDAZOLAM HCL 2 MG/2ML IJ SOLN
INTRAMUSCULAR | Status: AC
  Filled 2020-04-12: qty 2

## 2020-04-12 MED ORDER — FENTANYL CITRATE (PF) 250 MCG/5ML IJ SOLN
INTRAMUSCULAR | Status: AC
  Filled 2020-04-12: qty 5

## 2020-04-12 MED ORDER — DICLOFENAC SODIUM 1 % EX GEL
2.0000 g | Freq: Four times a day (QID) | CUTANEOUS | Status: DC
  Administered 2020-04-12 – 2020-04-16 (×16): 2 g via TOPICAL
  Filled 2020-04-12: qty 100

## 2020-04-12 MED ORDER — DEXAMETHASONE SODIUM PHOSPHATE 10 MG/ML IJ SOLN
INTRAMUSCULAR | Status: DC | PRN
  Administered 2020-04-12: 7 mg via INTRAVENOUS

## 2020-04-12 MED ORDER — 0.9 % SODIUM CHLORIDE (POUR BTL) OPTIME
TOPICAL | Status: DC | PRN
  Administered 2020-04-12: 1000 mL

## 2020-04-12 MED ORDER — DEXAMETHASONE SODIUM PHOSPHATE 10 MG/ML IJ SOLN
INTRAMUSCULAR | Status: AC
  Filled 2020-04-12: qty 1

## 2020-04-12 MED ORDER — MIDAZOLAM HCL 5 MG/5ML IJ SOLN
INTRAMUSCULAR | Status: DC | PRN
  Administered 2020-04-12: 1 mg via INTRAVENOUS

## 2020-04-12 SURGICAL SUPPLY — 42 items
ADH SKN CLS APL DERMABOND .7 (GAUZE/BANDAGES/DRESSINGS) ×1
APL PRP STRL LF DISP 70% ISPRP (MISCELLANEOUS) ×1
APL SKNCLS STERI-STRIP NONHPOA (GAUZE/BANDAGES/DRESSINGS) ×1
BAG DECANTER FOR FLEXI CONT (MISCELLANEOUS) ×2 IMPLANT
BENZOIN TINCTURE PRP APPL 2/3 (GAUZE/BANDAGES/DRESSINGS) ×2 IMPLANT
BLADE SURG 15 STRL LF DISP TIS (BLADE) ×1 IMPLANT
BLADE SURG 15 STRL SS (BLADE) ×2
BLADE SURG SZ11 CARB STEEL (BLADE) ×2 IMPLANT
CHLORAPREP W/TINT 26 (MISCELLANEOUS) ×2 IMPLANT
COVER SURGICAL LIGHT HANDLE (MISCELLANEOUS) ×2 IMPLANT
COVER TRANSDUCER ULTRASND GEL (DISPOSABLE) ×1 IMPLANT
COVER WAND RF STERILE (DRAPES) IMPLANT
DECANTER SPIKE VIAL GLASS SM (MISCELLANEOUS) ×2 IMPLANT
DERMABOND ADVANCED (GAUZE/BANDAGES/DRESSINGS) ×1
DERMABOND ADVANCED .7 DNX12 (GAUZE/BANDAGES/DRESSINGS) ×1 IMPLANT
DRAPE C-ARM 42X120 X-RAY (DRAPES) ×2 IMPLANT
DRAPE LAPAROSCOPIC ABDOMINAL (DRAPES) ×2 IMPLANT
ELECT REM PT RETURN 15FT ADLT (MISCELLANEOUS) ×2 IMPLANT
GAUZE 4X4 16PLY RFD (DISPOSABLE) ×2 IMPLANT
GAUZE SPONGE 4X4 12PLY STRL (GAUZE/BANDAGES/DRESSINGS) ×1 IMPLANT
GLOVE BIOGEL PI IND STRL 7.0 (GLOVE) ×1 IMPLANT
GLOVE BIOGEL PI INDICATOR 7.0 (GLOVE) ×1
GLOVE SURG SS PI 7.0 STRL IVOR (GLOVE) ×2 IMPLANT
GOWN STRL REUS W/TWL LRG LVL3 (GOWN DISPOSABLE) ×2 IMPLANT
GOWN STRL REUS W/TWL XL LVL3 (GOWN DISPOSABLE) ×2 IMPLANT
KIT BASIN OR (CUSTOM PROCEDURE TRAY) ×2 IMPLANT
KIT PORT POWER 8FR ISP CVUE (Port) ×2 IMPLANT
KIT TURNOVER KIT A (KITS) IMPLANT
NEEDLE HYPO 22GX1.5 SAFETY (NEEDLE) ×2 IMPLANT
PACK BASIC VI WITH GOWN DISP (CUSTOM PROCEDURE TRAY) ×2 IMPLANT
PENCIL SMOKE EVACUATOR (MISCELLANEOUS) IMPLANT
SUT MNCRL AB 4-0 PS2 18 (SUTURE) ×2 IMPLANT
SUT PROLENE 3 0 SH 48 (SUTURE) ×2 IMPLANT
SUT VIC AB 2-0 SH 18 (SUTURE) IMPLANT
SUT VIC AB 2-0 SH 27 (SUTURE)
SUT VIC AB 2-0 SH 27X BRD (SUTURE) IMPLANT
SUT VIC AB 3-0 SH 27 (SUTURE) ×2
SUT VIC AB 3-0 SH 27XBRD (SUTURE) ×1 IMPLANT
SYR 10ML LL (SYRINGE) ×2 IMPLANT
SYR 20ML LL LF (SYRINGE) ×2 IMPLANT
TOWEL OR 17X26 10 PK STRL BLUE (TOWEL DISPOSABLE) ×2 IMPLANT
TOWEL OR NON WOVEN STRL DISP B (DISPOSABLE) ×2 IMPLANT

## 2020-04-12 NOTE — Progress Notes (Signed)
Progress Note: General Surgery Service   Chief Complaint/Subjective: Sharp pain in upper abdomen with movement, no nausea or vomiting yesterday, ambulating more  Objective: Vital signs in last 24 hours: Temp:  [97.6 F (36.4 C)-98.1 F (36.7 C)] 97.6 F (36.4 C) (07/15 0629) Pulse Rate:  [67-72] 67 (07/15 0629) Resp:  [17-18] 18 (07/15 0629) BP: (113-131)/(75-89) 113/75 (07/15 0629) SpO2:  [99 %-100 %] 100 % (07/15 0629) Last BM Date: 04/10/20  Intake/Output from previous day: 07/14 0701 - 07/15 0700 In: 480 [P.O.:480] Out: 0  Intake/Output this shift: No intake/output data recorded.  Gen: NAD  Resp: nonlabored  Card: RRR  Abd: soft, ATTP, vac in place, ostomy pink with gas in bag  Lab Results: CBC  Recent Labs    04/11/20 0513 04/12/20 0438  WBC 10.5 9.8  HGB 9.7* 9.8*  HCT 31.0* 31.6*  PLT 290 366   BMET Recent Labs    04/11/20 0513 04/12/20 0438  NA 136 137  K 3.5 3.4*  CL 103 101  CO2 22 24  GLUCOSE 85 91  BUN <5* <5*  CREATININE 0.37* 0.41*  CALCIUM 7.9* 8.0*   PT/INR No results for input(s): LABPROT, INR in the last 72 hours. ABG No results for input(s): PHART, HCO3 in the last 72 hours.  Invalid input(s): PCO2, PO2  Anti-infectives: Anti-infectives (From admission, onward)   Start     Dose/Rate Route Frequency Ordered Stop   04/12/20 0730  ceFAZolin (ANCEF) IVPB 2g/100 mL premix     Discontinue     2 g 200 mL/hr over 30 Minutes Intravenous On call to O.R. 04/12/20 0728 04/13/20 0559   04/10/20 1330  amoxicillin-clavulanate (AUGMENTIN) 875-125 MG per tablet 1 tablet     Discontinue     1 tablet Oral Every 12 hours 04/10/20 1218 04/22/20 0959   04/08/20 1400  vancomycin (VANCOCIN) IVPB 1000 mg/200 mL premix  Status:  Discontinued        1,000 mg 200 mL/hr over 60 Minutes Intravenous Every 8 hours 04/08/20 1136 04/08/20 1322   04/08/20 0000  vancomycin (VANCOREADY) IVPB 750 mg/150 mL  Status:  Discontinued        750 mg 150 mL/hr over  60 Minutes Intravenous Every 8 hours 04/07/20 1628 04/08/20 1136   04/07/20 1645  vancomycin (VANCOREADY) IVPB 1500 mg/300 mL        1,500 mg 150 mL/hr over 120 Minutes Intravenous STAT 04/07/20 1638 04/07/20 1847   04/07/20 1630  vancomycin (VANCOREADY) IVPB 1500 mg/300 mL  Status:  Discontinued        1,500 mg 150 mL/hr over 120 Minutes Intravenous  Once 04/07/20 1627 04/07/20 1628   04/04/20 1045  cefoTEtan (CEFOTAN) 2 g in sodium chloride 0.9 % 100 mL IVPB        2 g 200 mL/hr over 30 Minutes Intravenous On call to O.R. 04/03/20 1639 04/04/20 1213   03/31/20 2000  ceFEPIme (MAXIPIME) 2 g in sodium chloride 0.9 % 100 mL IVPB  Status:  Discontinued        2 g 200 mL/hr over 30 Minutes Intravenous Every 8 hours 03/31/20 1408 04/10/20 0917   03/31/20 2000  metroNIDAZOLE (FLAGYL) IVPB 500 mg  Status:  Discontinued        500 mg 100 mL/hr over 60 Minutes Intravenous Every 8 hours 03/31/20 1410 04/10/20 0917   03/31/20 1600  ceFEPIme (MAXIPIME) 2 g in sodium chloride 0.9 % 100 mL IVPB  Status:  Discontinued  2 g 200 mL/hr over 30 Minutes Intravenous  Once 03/31/20 1549 03/31/20 1551   03/31/20 1200  piperacillin-tazobactam (ZOSYN) IVPB 3.375 g        3.375 g 100 mL/hr over 30 Minutes Intravenous  Once 03/31/20 1159 03/31/20 1341      Medications: Scheduled Meds: . acetaminophen  1,000 mg Oral Q8H  . amoxicillin-clavulanate  1 tablet Oral Q12H  . Chlorhexidine Gluconate Cloth  6 each Topical Daily  . enoxaparin (LOVENOX) injection  40 mg Subcutaneous Q24H  . famotidine  20 mg Oral BID  . methocarbamol  1,000 mg Oral QID   Continuous Infusions: .  ceFAZolin (ANCEF) IV     PRN Meds:.HYDROmorphone (DILAUDID) injection, ondansetron **OR** ondansetron (ZOFRAN) IV, oxyCODONE, prochlorperazine, traMADol  Assessment/Plan: s/p Procedure(s): EXPLORATORY LAPAROTOMY CYSTOSCOPY WITH STENT PLACEMENT 04/04/2020 -NPO for procedure -plan for port-o-cath insertion today -ambulate -pain  control    LOS: 12 days   Mickeal Skinner, MD [facility]

## 2020-04-12 NOTE — Progress Notes (Signed)
PROGRESS NOTE    Stephanie [name]  PYP:[id number] DOB: [birth year] DOA: 03/31/2020 PCP: Patient, [name]   Chief Complaint  Patient presents with  . Abdominal Pain    Brief Narrative: Zilda No Mittelmanis Stephanie [name] 39 y.o.femalewith medical history significant ofrecent admission from June 14 to June 18 with abdominal pain and abscess with Tobie Perdue question of sigmoidcolon malignancy versus diverticulitis. At that time she had an abnormal CT scan which also showed Bandon Sherwin liver mass. She had Unice Vantassel barely elevated CEA at 4.9. She was placed on IV antibiotics, an attempt was made at liver biopsy however ultrasound suggested it might be Gust Eugene hemangioma. An MRI was equivocal. She was sent home on Augmentin which she completed and was doing well with until the last few days. She has continued to have blood in her stool. Stools are soft and not fully formed. She is passing gas. She has had increasing nausea with emesis over the last 12 hours. She reports ongoing lower abdominal cramping that is worse in the left lower quadrant. Her pain was 10 out of 10 requiring IV Dilaudid in the ED. She has an IUD in place, no sexual partner for the last 1 year. She denies vaginal discharge, vaginal bleeding, dysuria, hematuria, chest pain, or shortness of breath. In the ED she was sent for CT of the abdomen and pelvis which showed enlarging sigmoid colonic mass, which appeared which appears to be associated with focal perforation and has fistulized into the left adnexal region involving the left TOA. There are multiple enlarged lymph nodes which are bigger than before measuring up to 1.2 cm. The previously suspected metastatic lesion in the liver is also enlarged, small volume ascites. Patient placed empirically on IV antibiotics.  GI and general surgery were consulted.  Patient for exploratory laparotomy and partial colectomy with urethral stent placement today 04/04/2020 per general surgery. Patient with some complaints of  shortness of breath the morning of 04/04/2020, concern for volume overload and as such chest x-ray obtained and patient given Brannon Decaire dose of IV Lasix, IV fluids discontinued.  She was admitted for abdominal pain, nausea, and vomiting and had imaging findings concerning for colonic mass with metastatic disease with tubo-ovarian abscess and focally contained perforation.  She's now s/p surgery with colectomy and end colostomy.  Cultures growing staph epidermidis.  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  Previously suspected mestatic lesion in segment 7 of the liver has enlarged.   General surgery consulted - now s/p colectomy and end colostomy on 04/04/20 (findings at surgery concerning for sigmoid colon cancer with pelvic abscess)- continue antibiotics Biopsies c/w invasive moderately differentiated adenocarcinoma, 6 cm, involving rectosigmoid junction.  Carcinoma invades into serosal surface with perforation and associated serositis.  Radial resection margin is positive for carcinoma; proximal and distal margins are not involved.  Lymphovascular invasion is present.  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  One tumor deposit.  (mesenteric LN, resection) Metastatic adenocarcinoma to 1 of 6 LN's, 1 tumor deposit. Oncology c/s - per note from 7/9, will arrange f/u with GI oncologist outpatient  ID c/s - recommending augmentin to treat for 2 weeks of abx from 7/11 when ready for d/c (has been narrowed to augmentin per surgery)  Abscess with staph epidermidis - pan sensitive Soft diet per surgery Port placed on 7/15  3.  Shortness of breath Secondary to volume overload. CXR from 7/7 with LLL retrocardiac collapse/consolidation and small L effusion Improved with lasix Continue to follow I/O, daily weights   4.  Nausea/vomiting/abdominal pain.   Due to sigmoid colon cancer with pelvic abscess Continue analgesia/antiemetics prn     # Right Side Pain: suspect this is musculoskeletal, trial of voltaren  5.  Acute blood loss anemia/chronic iron deficiency anemia Patient noted to have some scant bright red blood per rectum noted on previous exam.  Hemoglobin stable around 9  GI saw earlier in hospitalization prior to surgery and at that time planned for colonoscopy in 6-8 weeks -> follow up with oncology and GI outpatient    6.  Moderate protein calorie malnutrition Supplements ordered Continue soft diet  7.  Hypokalemia Improved, follow   8. Urinary retention Foley catheter discontinued per general surgery on 04/05/2020 however patient noted to have urinary retention and foley was replaced Doing well without foley   DVT prophylaxis: lovenox Code Status: full  Family Communication: none at bedside Disposition:   Status is: Inpatient  Remains inpatient appropriate because:Inpatient level of care appropriate due to severity of illness   Dispo: The patient is from: Home              Anticipated d/c is to: pending              Anticipated d/c date is: > 3 days              Patient currently is not medically stable to d/c.   Consultants:    Surgery  Urology  ID  Oncology  Gastroenterology  Procedures:  Open sigmoid colectomy and end colostomy 7/7 by general surgery Cystoscopy, retrograde pyelogram with interpretation, bilateral temporary ureteral stent placement 7/7 by urology  7/15 insertion of right internal jugular port-Martie Fulgham-cath with ultrasound and fluoro guidance Antimicrobials: Anti-infectives (From admission, onward)   Start     Dose/Rate Route Frequency Ordered Stop   04/12/20 1039  ceFAZolin (ANCEF) 2-4 GM/100ML-% IVPB       Note to Pharmacy: Randa Evens  : cabinet override      04/12/20 1039 04/12/20 1130   04/12/20 0730  ceFAZolin (ANCEF) IVPB 2g/100 mL premix        2 g 200 mL/hr over 30 Minutes Intravenous On call to O.R. 04/12/20 0728 04/12/20 1119   04/10/20 1330  amoxicillin-clavulanate (AUGMENTIN) 875-125 MG per tablet 1 tablet     Discontinue     1 tablet Oral Every 12 hours 04/10/20 1218 04/22/20 0959   04/08/20 1400  vancomycin (VANCOCIN) IVPB 1000 mg/200 mL premix  Status:  Discontinued        1,000 mg 200 mL/hr over 60 Minutes Intravenous Every 8 hours 04/08/20 1136 04/08/20 1322   04/08/20 0000  vancomycin (VANCOREADY) IVPB 750 mg/150 mL  Status:  Discontinued  750 mg 150 mL/hr over 60 Minutes Intravenous Every 8 hours 04/07/20 1628 04/08/20 1136   04/07/20 1645  vancomycin (VANCOREADY) IVPB 1500 mg/300 mL        1,500 mg 150 mL/hr over 120 Minutes Intravenous STAT 04/07/20 1638 04/07/20 1847   04/07/20 1630  vancomycin (VANCOREADY) IVPB 1500 mg/300 mL  Status:  Discontinued        1,500 mg 150 mL/hr over 120 Minutes Intravenous  Once 04/07/20 1627 04/07/20 1628   04/04/20 1045  cefoTEtan (CEFOTAN) 2 g in sodium chloride 0.9 % 100 mL IVPB        2 g 200 mL/hr over 30 Minutes Intravenous On call to O.R. 04/03/20 1639 04/04/20 1213   03/31/20 2000  ceFEPIme (MAXIPIME) 2 g in sodium chloride 0.9 % 100 mL IVPB  Status:  Discontinued        2 g 200 mL/hr over 30 Minutes  Intravenous Every 8 hours 03/31/20 1408 04/10/20 0917   03/31/20 2000  metroNIDAZOLE (FLAGYL) IVPB 500 mg  Status:  Discontinued        500 mg 100 mL/hr over 60 Minutes Intravenous Every 8 hours 03/31/20 1410 04/10/20 0917   03/31/20 1600  ceFEPIme (MAXIPIME) 2 g in sodium chloride 0.9 % 100 mL IVPB  Status:  Discontinued        2 g 200 mL/hr over 30 Minutes Intravenous  Once 03/31/20 1549 03/31/20 1551   03/31/20 1200  piperacillin-tazobactam (ZOSYN) IVPB 3.375 g        3.375 g 100 mL/hr over 30 Minutes Intravenous  Once 03/31/20 1159 03/31/20 1341     Subjective: Some muscular pain today, she thinks from turning quickly   Objective: Vitals:   04/12/20 1245 04/12/20 1300 04/12/20 1326 04/12/20 1430  BP: 134/80 130/74 126/77 120/79  Pulse: 77 80 90 80  Resp: 10 12 16 18   Temp:  98 F (36.7 C) 98.9 F (37.2 C) 98.6 F (37 C)  TempSrc:   Oral Oral  SpO2: 100% 99% 96% 95%  Weight:      Height:        Intake/Output Summary (Last 24 hours) at 04/12/2020 1504 Last data filed at 04/12/2020 1222 Gross per 24 hour  Intake 750 ml  Output 150 ml  Net 600 ml   Filed Weights   04/08/20 0528 04/08/20 1132 04/09/20 0642  Weight: 85.9 kg 82.9 kg 83.1 kg    Examination:  General: No acute distress. Cardiovascular: Heart sounds show Keierra Nudo regular rate, and rhythm.  Lungs: Clear to auscultation bilaterally  Abdomen: Soft, nontender, nondistended.  Ostomy.  Dressing intact. TTP on costal border on R side Neurological: Alert and oriented 3. Moves all extremities 4. Cranial nerves II through XII grossly intact. Skin: Warm and dry. No rashes or lesions. Extremities: No clubbing or cyanosis. No edema.   Data Reviewed: I have personally reviewed following labs and imaging studies  CBC: Recent Labs  Lab 04/09/20 0527 04/10/20 0801 04/10/20 1320 04/11/20 0513 04/12/20 0438  WBC 12.7* 10.7* 11.5* 10.5 9.8  NEUTROABS 10.7* 8.5* 9.5* 8.6* 7.7  HGB 9.7* 9.7* 9.6* 9.7* 9.8*  HCT 31.2*  31.1* 30.8* 31.0* 31.6*  MCV 81.3 81.2 81.1 80.7 81.7  PLT 233 285 292 290 856    Basic Metabolic Panel: Recent Labs  Lab 04/06/20 0430 04/06/20 0430 04/07/20 0513 04/07/20 0513 04/08/20 0513 04/09/20 0527 04/10/20 0506 04/11/20 0513 04/12/20 0438  NA 141   < > 137   < > 135 136 138  136 137  K 3.3*   < > 3.2*   < > 4.1 3.8 3.8 3.5 3.4*  CL 108   < > 105   < > 102 104 104 103 101  CO2 26   < > 25   < > 22 23 23 22 24   GLUCOSE 117*   < > 80   < > 73 87 83 85 91  BUN 19   < > 12   < > 6 <5* <5* <5* <5*  CREATININE 0.44   < > 0.44   < > 0.44 0.47 0.48 0.37* 0.41*  CALCIUM 8.0*   < > 7.4*   < > 7.5* 7.7* 7.8* 7.9* 8.0*  MG 2.0  --  1.9  --   --  1.8 2.0  --  1.7  PHOS  --   --   --   --   --   --   --   --  3.8   < > = values in this interval not displayed.    GFR: Estimated Creatinine Clearance: 108.8 mL/min (Erline Siddoway) (by C-G formula based on SCr of 0.41 mg/dL (L)).  Liver Function Tests: Recent Labs  Lab 04/06/20 0430 04/12/20 0438  AST 14* 26  ALT 8 14  ALKPHOS 39 48  BILITOT 0.5 0.7  PROT 4.9* 5.7*  ALBUMIN 1.9* 2.3*    CBG: No results for input(s): GLUCAP in the last 168 hours.   Recent Results (from the past 240 hour(s))  Surgical PCR screen     Status: None   Collection Time: 04/03/20  4:52 PM   Specimen: Nasal Mucosa; Nasal Swab  Result Value Ref Range Status   MRSA, PCR NEGATIVE NEGATIVE Final   Staphylococcus aureus NEGATIVE NEGATIVE Final    Comment: (NOTE) The Xpert SA Assay (FDA approved for NASAL specimens in patients 36 years of age and older), is one component of Eldo Umanzor comprehensive surveillance program. It is not intended to diagnose infection nor to guide or monitor treatment. Performed at [facility], [place] [address] [place], [place] [address]   Aerobic/Anaerobic Culture (surgical/deep wound)     Status: None   Collection Time: 04/04/20 12:46 PM   Specimen: Abdomen; Abscess  Result Value Ref Range Status   Specimen  Description   Final    ABDOMEN PERITONEAL ABSCESS Performed at [place] [address] [place], [place] [address]    Special Requests   Final    NONE Performed at [facility], [place] [address] [place], [place] [address]    Gram Stain   Final    RARE WBC PRESENT, PREDOMINANTLY MONONUCLEAR NO ORGANISMS SEEN    Culture   Final    RARE STAPHYLOCOCCUS EPIDERMIDIS NO ANAEROBES ISOLATED Performed at [facility], [place] [address], [place],  [address]    Report Status 04/09/2020 FINAL  Final   Organism ID, Bacteria STAPHYLOCOCCUS EPIDERMIDIS  Final      Susceptibility   Staphylococcus epidermidis - MIC*    CIPROFLOXACIN <=0.5 SENSITIVE Sensitive     ERYTHROMYCIN <=0.25 SENSITIVE Sensitive     GENTAMICIN <=0.5 SENSITIVE Sensitive     OXACILLIN <=0.25 SENSITIVE Sensitive     TETRACYCLINE 2 SENSITIVE Sensitive     VANCOMYCIN 2 SENSITIVE Sensitive     TRIMETH/SULFA <=10 SENSITIVE Sensitive     CLINDAMYCIN <=0.25 SENSITIVE Sensitive     RIFAMPIN <=0.5 SENSITIVE Sensitive     Inducible Clindamycin NEGATIVE Sensitive     *  RARE STAPHYLOCOCCUS EPIDERMIDIS         Radiology Studies: DG Abd Portable 1V  Result Date: 04/11/2020 CLINICAL DATA:  Abdominal pain, vomiting EXAM: PORTABLE ABDOMEN - 1 VIEW COMPARISON:  12/26/2016.  CT 03/31/2020 FINDINGS: Nonobstructive bowel gas pattern. Left lower quadrant ostomy noted. IUD present in the pelvis. No free air or organomegaly. No suspicious calcification. IMPRESSION: Left lower quadrant ostomy noted. No evidence of bowel obstruction. No acute findings. Electronically Signed   By: Rolm Baptise M.D.   On: 04/11/2020 11:05   DG C-Arm 1-60 Min-No Report  Result Date: 04/12/2020 Fluoroscopy was utilized by the requesting physician.  No radiographic interpretation.        Scheduled Meds: . acetaminophen  1,000 mg Oral Q8H  . amoxicillin-clavulanate  1 tablet Oral Q12H  . Chlorhexidine  Gluconate Cloth  6 each Topical Daily  . diclofenac Sodium  2 g Topical QID  . enoxaparin (LOVENOX) injection  40 mg Subcutaneous Q24H  . famotidine  20 mg Oral BID  . fentaNYL      . methocarbamol  1,000 mg Oral QID   Continuous Infusions: . lactated ringers 50 mL/hr at 04/12/20 1105     LOS: 12 days    Time spent: over 30 min    Fayrene Helper, MD [facility]   To contact the attending provider between 7A-7P or the covering provider during after hours 7P-7A, please log into the web site [email or web address] and access using universal [place] password for that web site. If you do not have the password, please call the hospital operator.  04/12/2020, 3:04 PM

## 2020-04-12 NOTE — Transfer of Care (Signed)
Immediate Anesthesia Transfer of Care Note  Patient: Stephanie [name]  Procedure(s) Performed: INSERTION PORT-A-CATH WITH ULTRASOUND (Right Chest)  Patient Location: PACU  Anesthesia Type:General  Level of Consciousness: awake, alert  and patient cooperative  Airway & Oxygen Therapy: Patient Spontanous Breathing and Patient connected to face mask oxygen  Post-op Assessment: Report given to RN and Post -op Vital signs reviewed and stable  Post vital signs: Reviewed and stable  Last Vitals:  Vitals Value Taken Time  BP 133/80 04/12/20 1219  Temp    Pulse 86 04/12/20 1221  Resp 19 04/12/20 1221  SpO2 100 % 04/12/20 1221  Vitals shown include unvalidated device data.  Last Pain:  Vitals:   04/12/20 0756  TempSrc:   PainSc: 8       Patients Stated Pain Goal: 2 (29/92/42 6834)  Complications: No complications documented.

## 2020-04-12 NOTE — Op Note (Signed)
Preoperative diagnosis: colon cancer  Postoperative diagnosis: same  Procedure: insertion of right internal jugular port-a-cath with ultrasound and fluoro guidance  Surgeon: Gurney Maxin, M.D.  Asst: none  Anesthesia: gen   Indications for procedure: 39 yo female with colon cancer with plans to undergo chemotherapy presents for port-o-cath insertion.  Description of procedure: The patient was brought into the operative suite, anesthesia was administered with LMA, both arms were tucked with offloading foam over all pressure points. The patient was prepped and draped in the usual sterile fashion. Next WHO checklist was completed. The patient was put in trendelenberg, the [place] was used to assess anatomy and the RIJ was large and lay anterior the the common carotid artery A 18ga needle was used to gain access to the RIJ using ultrasound guidance. Nonpulsatile flow was seen and the J-wire was advanced without tension. XR was used to confirm placement within the venous system. No arrthymias were seen, the wire was clamped in place. The percutaneous access site was widened with a 11 blade. Local anesthesia was used to anesthetize the space inferior to the right clavicle. Next a 3cm incision was made 3cm inferior to the clavicle. Cautery was used to create a pocket along the fascia inferior to incision. The tunneling device was used to make a wide turn from the pocket up to the perc access site. The introducer and sheath were then thread over the J wire in seldinger technique and wire was removed. Next the catheter was thread from the incision to the access site and inserted into the sheath after the introducer was removed. Sheath was then pulled and removed. XR showed the tip of catheter to be at the atrial-caval junction. The port was then attached to the catheter cutting the catheter at appropriate length. The port was then placed in pocket and sewed to the fascia in two places with a 2-0 prolene. The port  was accessed with huber needel and flushed and drew easily and the port was flushed with concentrated heparin. The incision was closed with 3-0 vicryl followed with 4-0 monocryl in running subcu fashion. A single 4-0 was used to close the access site. Liqui-band was placed for dressing. Patient was extubated and brought to pacu in stable condition.  Findings: patent RIJ, catheter tip at the SVC atrial junction  Specimen: none  Blood loss: 10cc  Local anesthesia: 20cc 0.25% marcaine w epi  Complications: none  Gurney Maxin, M.D. General, Bariatric, & Minimally Invasive Surgery [facility]

## 2020-04-12 NOTE — Anesthesia Preprocedure Evaluation (Addendum)
Anesthesia Evaluation  Patient identified by MRN, date of birth, ID band Patient awake    Reviewed: Allergy & Precautions, NPO status , Patient's Chart, lab work & pertinent test results  Airway Mallampati: I  TM Distance: >3 FB Neck ROM: Full    Dental no notable dental hx.    Pulmonary shortness of breath, former smoker,    Pulmonary exam normal        Cardiovascular Exercise Tolerance: Good negative cardio ROS Normal cardiovascular exam     Neuro/Psych PSYCHIATRIC DISORDERS Anxiety negative neurological ROS     GI/Hepatic negative GI ROS, Neg liver ROS, Probable colon ca   Endo/Other  negative endocrine ROS  Renal/GU Lab Results      Component                Value               Date                      CREATININE               0.60                04/04/2020                BUN                      10                  04/04/2020                NA                       139                 04/04/2020                K                        3.2 (L)             04/04/2020                CL                       105                 04/04/2020                CO2                      23                  04/04/2020             negative genitourinary   Musculoskeletal negative musculoskeletal ROS (+)   Abdominal Normal abdominal exam  (+)   Peds  Hematology  (+) Blood dyscrasia, anemia , Lab Results      Component                Value               Date                      WBC  9.8                 04/04/2020                HGB                      9.8 (L)             04/04/2020                HCT                      31.6 (L)            04/04/2020                MCV                      82.9                04/04/2020                PLT                      172                 04/04/2020              Anesthesia Other Findings   Reproductive/Obstetrics negative OB ROS                              Anesthesia Physical  Anesthesia Plan  ASA: II  Anesthesia Plan: General   Post-op Pain Management:    Induction: Intravenous  PONV Risk Score and Plan: 4 or greater and Treatment may vary due to age or medical condition, Ondansetron, Dexamethasone, Scopolamine patch - Pre-op and Midazolam  Airway Management Planned: LMA  Additional Equipment: None  Intra-op Plan:   Post-operative Plan: Extubation in OR  Informed Consent: I have reviewed the patients History and Physical, chart, labs and discussed the procedure including the risks, benefits and alternatives for the proposed anesthesia with the patient or authorized representative who has indicated his/her understanding and acceptance.     Dental advisory given  Plan Discussed with: CRNA  Anesthesia Plan Comments:         Anesthesia Quick Evaluation

## 2020-04-12 NOTE — Anesthesia Procedure Notes (Signed)
Procedure Name: LMA Insertion Date/Time: 04/12/2020 11:17 AM Performed by: West Pugh, CRNA Pre-anesthesia Checklist: Patient identified, Emergency Drugs available, Suction available, Patient being monitored and Timeout performed Patient Re-evaluated:Patient Re-evaluated prior to induction Oxygen Delivery Method: Circle system utilized Preoxygenation: Pre-oxygenation with 100% oxygen Induction Type: IV induction Ventilation: Mask ventilation without difficulty LMA: LMA with gastric port inserted LMA Size: 4.0 Placement Confirmation: positive ETCO2 and breath sounds checked- equal and bilateral Tube secured with: Tape Dental Injury: Teeth and Oropharynx as per pre-operative assessment

## 2020-04-13 ENCOUNTER — Encounter: Payer: Self-pay | Admitting: General Practice

## 2020-04-13 ENCOUNTER — Encounter (HOSPITAL_COMMUNITY): Payer: Self-pay | Admitting: General Surgery

## 2020-04-13 ENCOUNTER — Inpatient Hospital Stay (HOSPITAL_COMMUNITY): Payer: Medicaid Other

## 2020-04-13 DIAGNOSIS — C187 Malignant neoplasm of sigmoid colon: Secondary | ICD-10-CM

## 2020-04-13 DIAGNOSIS — A419 Sepsis, unspecified organism: Principal | ICD-10-CM

## 2020-04-13 LAB — CBC WITH DIFFERENTIAL/PLATELET
Abs Immature Granulocytes: 0.08 10*3/uL — ABNORMAL HIGH (ref 0.00–0.07)
Basophils Absolute: 0 10*3/uL (ref 0.0–0.1)
Basophils Relative: 0 %
Eosinophils Absolute: 0 10*3/uL (ref 0.0–0.5)
Eosinophils Relative: 0 %
HCT: 33.7 % — ABNORMAL LOW (ref 36.0–46.0)
Hemoglobin: 10.4 g/dL — ABNORMAL LOW (ref 12.0–15.0)
Immature Granulocytes: 1 %
Lymphocytes Relative: 9 %
Lymphs Abs: 0.9 10*3/uL (ref 0.7–4.0)
MCH: 25.2 pg — ABNORMAL LOW (ref 26.0–34.0)
MCHC: 30.9 g/dL (ref 30.0–36.0)
MCV: 81.8 fL (ref 80.0–100.0)
Monocytes Absolute: 0.8 10*3/uL (ref 0.1–1.0)
Monocytes Relative: 8 %
Neutro Abs: 8.1 10*3/uL — ABNORMAL HIGH (ref 1.7–7.7)
Neutrophils Relative %: 82 %
Platelets: 440 10*3/uL — ABNORMAL HIGH (ref 150–400)
RBC: 4.12 MIL/uL (ref 3.87–5.11)
RDW: 22.2 % — ABNORMAL HIGH (ref 11.5–15.5)
WBC: 9.9 10*3/uL (ref 4.0–10.5)
nRBC: 0 % (ref 0.0–0.2)

## 2020-04-13 LAB — COMPREHENSIVE METABOLIC PANEL
ALT: 13 U/L (ref 0–44)
AST: 19 U/L (ref 15–41)
Albumin: 2.4 g/dL — ABNORMAL LOW (ref 3.5–5.0)
Alkaline Phosphatase: 52 U/L (ref 38–126)
Anion gap: 14 (ref 5–15)
BUN: <5 mg/dL — ABNORMAL LOW (ref 6–20)
CO2: 25 mmol/L (ref 22–32)
Calcium: 8.9 mg/dL (ref 8.9–10.3)
Chloride: 100 mmol/L (ref 98–111)
Creatinine, Ser: 0.36 mg/dL — ABNORMAL LOW (ref 0.44–1.00)
GFR calc Af Amer: >60 mL/min (ref >60)
GFR calc non Af Amer: >60 mL/min (ref >60)
Glucose, Bld: 121 mg/dL — ABNORMAL HIGH (ref 70–99)
Potassium: 4.3 mmol/L (ref 3.5–5.1)
Sodium: 139 mmol/L (ref 135–145)
Total Bilirubin: 0.4 mg/dL (ref 0.3–1.2)
Total Protein: 6.2 g/dL — ABNORMAL LOW (ref 6.5–8.1)

## 2020-04-13 LAB — IRON AND TIBC
Iron: 29 ug/dL (ref 28–170)
Saturation Ratios: 15 % (ref 10.4–31.8)
TIBC: 188 ug/dL — ABNORMAL LOW (ref 250–450)
UIBC: 159 ug/dL

## 2020-04-13 LAB — MAGNESIUM: Magnesium: 2 mg/dL (ref 1.7–2.4)

## 2020-04-13 LAB — PHOSPHORUS: Phosphorus: 3.7 mg/dL (ref 2.5–4.6)

## 2020-04-13 LAB — FERRITIN: Ferritin: 124 ng/mL (ref 11–307)

## 2020-04-13 IMAGING — CT CT CHEST W/O CM
2 of 4 series · 15 of 36 positions shown, 18 images · non-contrast
Comparison: None.

CLINICAL DATA: Colorectal cancer, staging examination, abdominal
pain

EXAM:
CT CHEST WITHOUT CONTRAST
TECHNIQUE: Multidetector CT imaging of the chest was performed following the
standard protocol without IV contrast.

[Series 2: thorax · axial · 0.67mm/px · z∈[-539,-257]mm · 12 of 163 slices shown, 15 images]
[im 11/163  mediastinal]
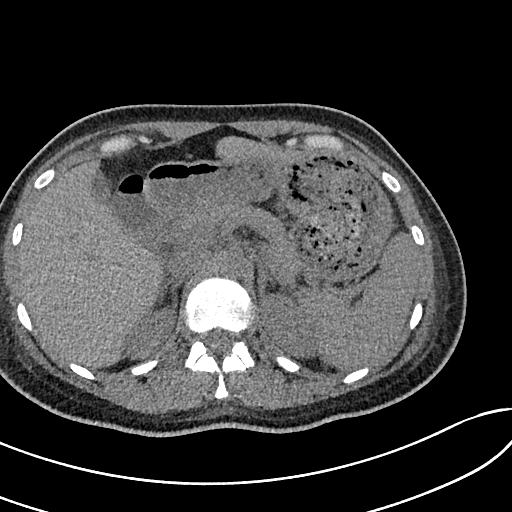
[im 11/163  lung]
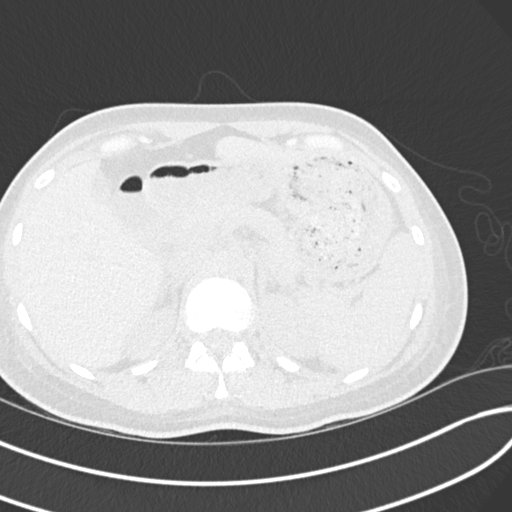
[im 22/163  lung]
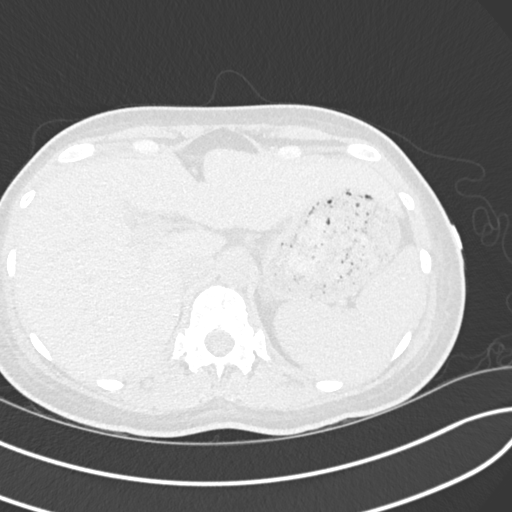
[im 33/163  lung]
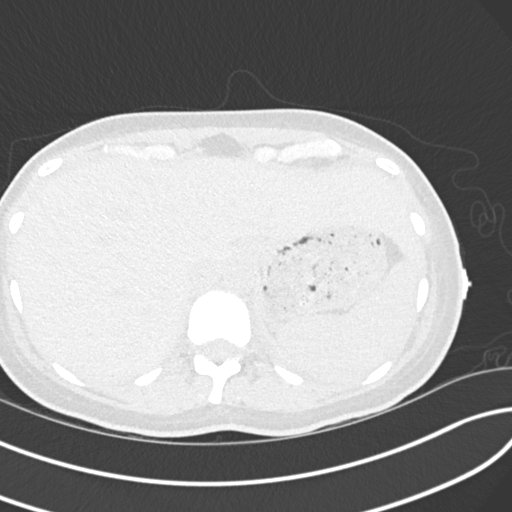
[im 55/163  lung]
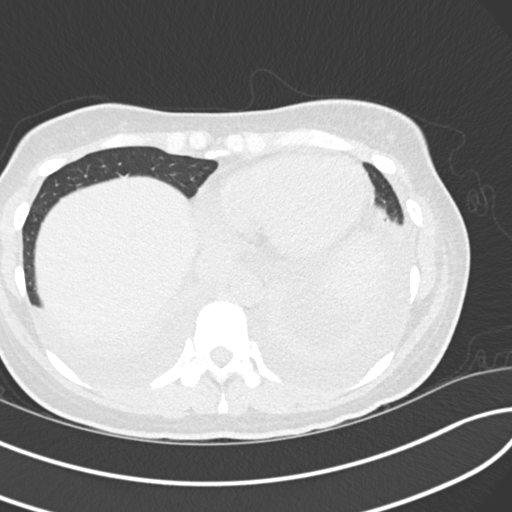
[im 65/163  mediastinal]
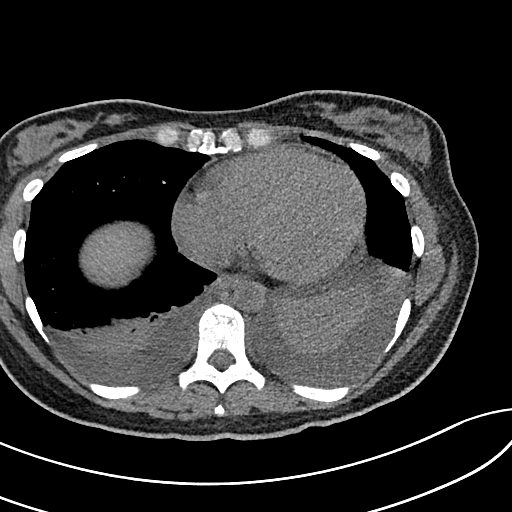
[im 65/163  lung]
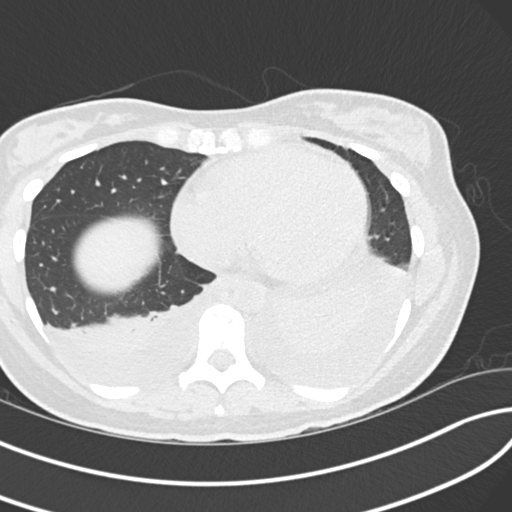
[im 76/163  lung]
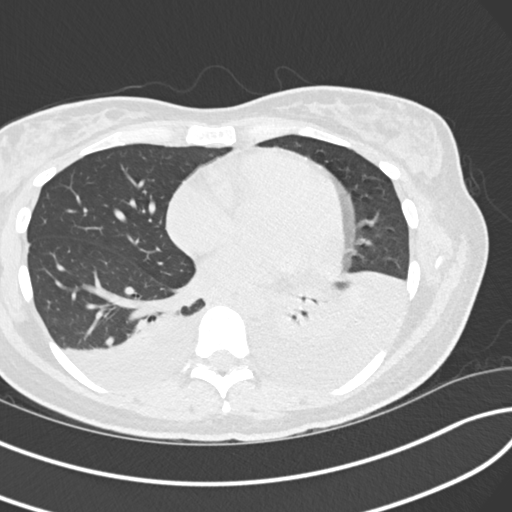
[im 87/163  lung]
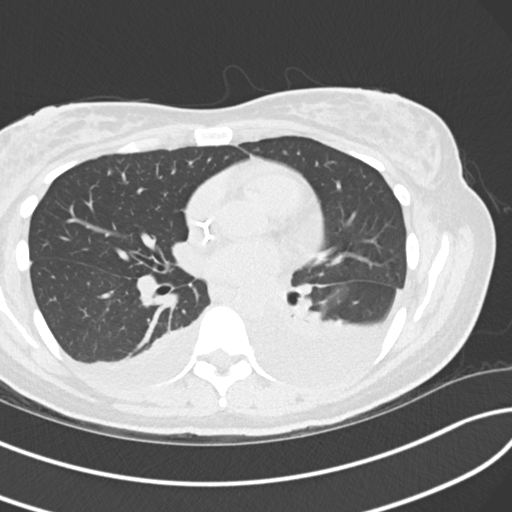
[im 98/163  lung]
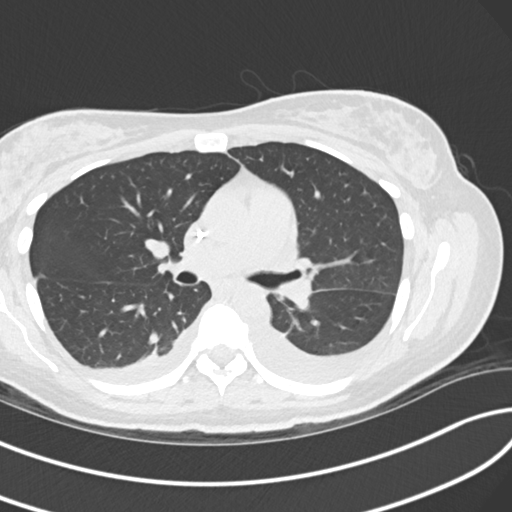
[im 109/163  mediastinal]
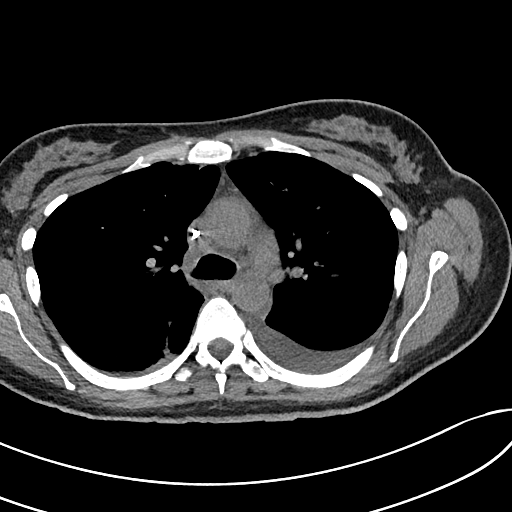
[im 109/163  lung]
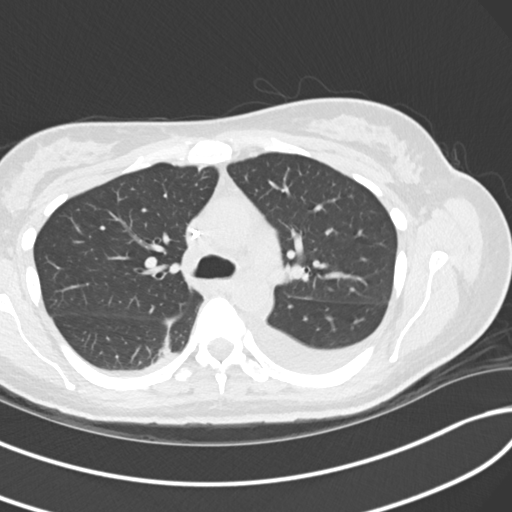
[im 130/163  lung]
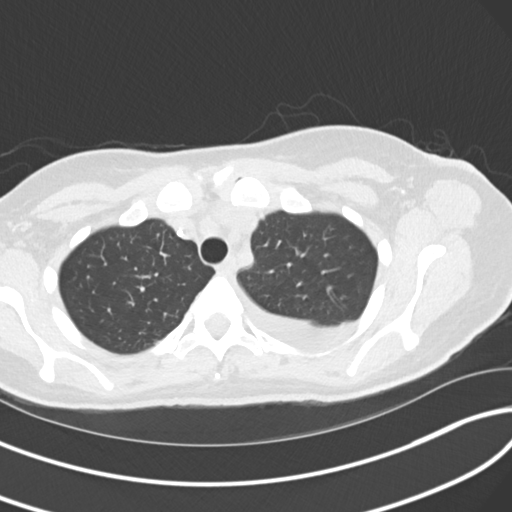
[im 141/163  lung]
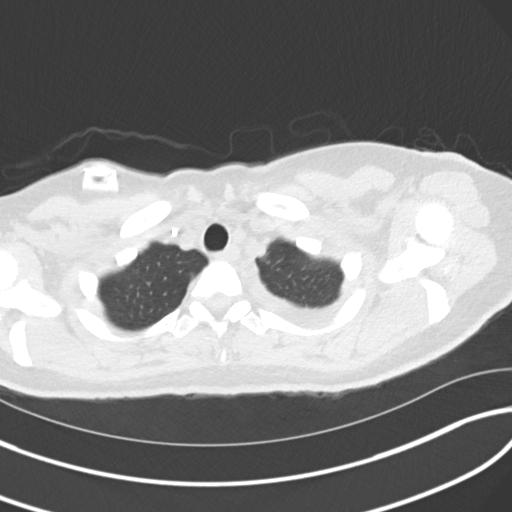
[im 152/163  lung]
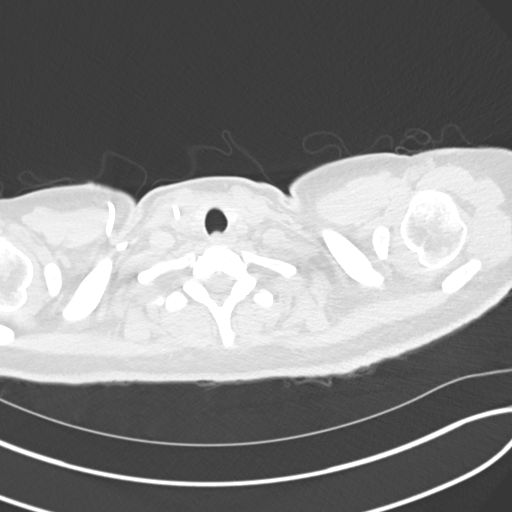

[Series 5: coronal · coronal · 0.64mm/px · 3 of 129 slices shown]
[im 26/129  lung]
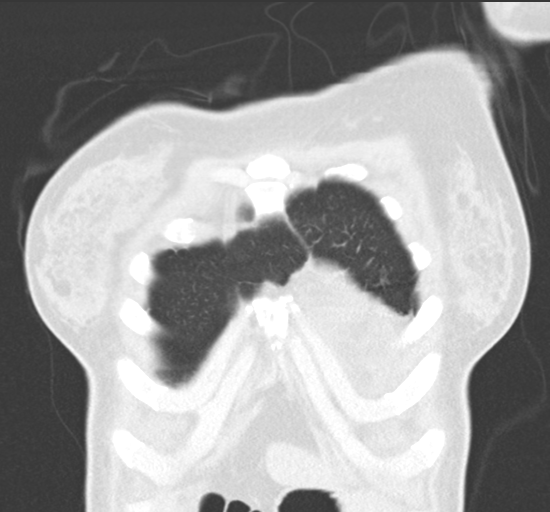
[im 52/129  lung]
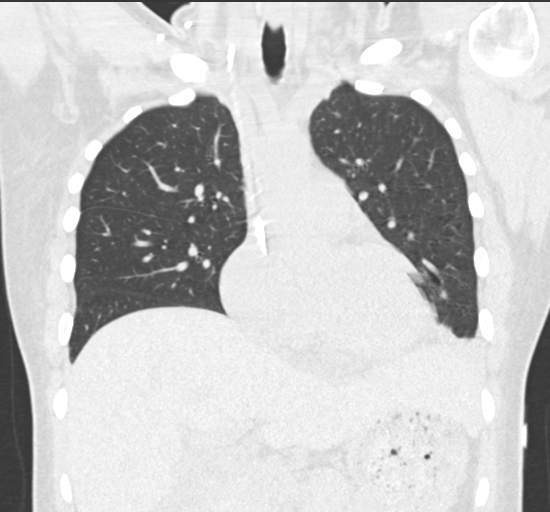
[im 77/129  lung]
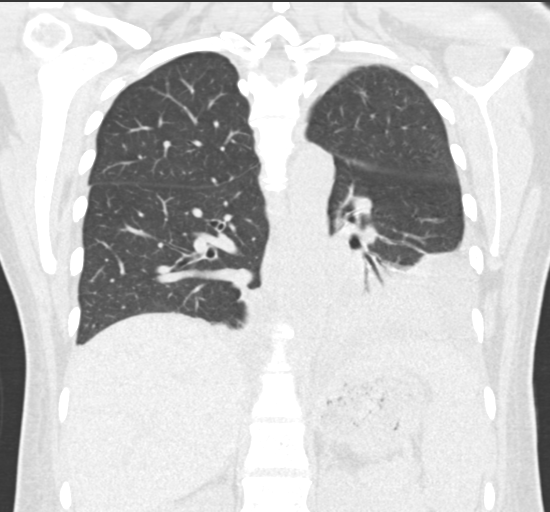

[15 of 36 positions shown; findings below may reference images not displayed]

FINDINGS: Cardiovascular: Right internal jugular chest port tip is seen within
the right atrium. Cardiac size within normal limits. No pericardial
effusion. No significant coronary artery calcification. Central
pulmonary arteries are of normal caliber. Thoracic aorta is
unremarkable on this noncontrast examination.

Mediastinum/Nodes: No pathologic thoracic adenopathy.

Lungs/Pleura: Small right and small to moderate left pleural
effusions have developed with associated bibasilar compressive
atelectasis with subtotal collapse of the left lower lobe. No focal
pulmonary nodules within the aerated pulmonary parenchyma. No
pneumothorax. Central airways are widely patent.

Upper Abdomen: Hypoechoic mass within the right hepatic lobe in
keeping with a hepatic metastasis is better seen on prior abdominal
CT examination of [DATE], but appears grossly unchanged
measuring roughly 23 mm [CP] mm on axial image # 121/2. Limited
images of the upper abdomen are otherwise unremarkable.

Musculoskeletal: No lytic or blastic bone lesions are seen.
IMPRESSION: Interval development of bilateral pleural effusions, left slightly
greater than right, with resultant bibasilar atelectasis including
subtotal collapse of the left lower lobe. No evidence of
intrathoracic metastatic disease, though evaluation of the collapsed
parenchyma is limited.

Hepatic metastasis again demonstrated.

## 2020-04-13 NOTE — TOC Progression Note (Addendum)
Transition of Care [facility]) - Progression Note    Patient Details  Name: ANDALYN HECKSTALL MRN: [id number] Date of Birth: 11/27/1980  Transition of Care [facility]) CM/SW Contact  Ross Ludwig, [place] Phone Number: 04/13/2020, 5:20 PM  Clinical Narrative:     Patient does not have any insurance, CSW contacted [facility] which is charity home health agency, they will not accept patient.  [facility] will check with other home health agencies to see who can accept patient under charity.  Per PA, if patient is not able to receiving home health RN for dressing changes, patient will not be going home on a wound vac and it will have to be wet to dry dressing change.  CSW awaiting to hear back from [facility] in regards to finding another home health agency.  Expected Discharge Plan: Home/Self Care Barriers to Discharge: Continued Medical Work up  Expected Discharge Plan and Services Expected Discharge Plan: Home/Self Care In-house Referral: Clinical Social Work     Living arrangements for the past 2 months: Single Family Home                                       Social Determinants of Health (SDOH) Interventions    Readmission Risk Interventions No flowsheet data found.

## 2020-04-13 NOTE — Consult Note (Signed)
[place]  [phone]   [place]  DOB: 07-03-1981  MR#: [id number]  CSN#: [id number]    Requesting Physician: Dr. Kieth Brightly   Patient Care Team: Patient, [name] as PCP - General (General Practice) [name], RN as Oncology Nurse Navigator [name], NP as Nurse Practitioner (Nurse Practitioner) [name], MD as Consulting Physician (Oncology)  Reason for consult: newly diagnosed sigmoid colon cancer   History of present illness: pt is a 39 year old Caucasian female without significant past medical history, presented with worsening abdominal pain.  She was admitted from June 14 to July 18 with abdominal abscess and a questionable sigmoid colon indeterminate liver lesion.  She was treated with IV antibiotics.  Abdominal MRI was done to evaluate her liver lesion, which was indeterminate.  She was discharged home.  She came back to ED on April 04, 2020, for worsening abdominal pain, nausea, and vomiting.  She was admitted for further evaluation.  Reviewed the CT showed colon mass which appears to be associated with focal perforation.  She underwent urgent open sigmoid colectomy and and colostomy and port placement Drs. Barry Dienes and Kessinger on April 04, 2020.  She was found to have peritonitis with pus throughout low abdomen.  She is recovering well from surgery.  She reports that mild intermittent blood in her stool for the past several months, he thought it was related to hemorrhoids.  She denies any change of bowel habits, no weight loss lately.  She is divorced, has a 105 year old daughter.  She works from home, as an Research scientist (life sciences).  Her mother does 47, and can help her.  She denies any significant past medical, denies smoking or alcohol use. She currently does not have health insurance, but she has been working on getting a new insurance lately.   MEDICAL HISTORY:  Past Medical History:    Diagnosis Date  . BV (bacterial vaginosis)   . Lactose intolerance 03/12/2020  . UTI (lower urinary tract infection)     SURGICAL HISTORY: Past Surgical History:  Procedure Laterality Date  . CESAREAN SECTION    . CYSTOSCOPY WITH STENT PLACEMENT  04/04/2020   Procedure: CYSTOSCOPY WITH STENT PLACEMENT;  Surgeon: [name], MD;  Location: [facility];  Service: General;;  . LAPAROTOMY N/A 04/04/2020   Procedure: EXPLORATORY LAPAROTOMY;  Surgeon: [name], MD;  Location: [facility];  Service: General;  Laterality: N/A;  . PORTACATH PLACEMENT Right 04/12/2020   Procedure: INSERTION PORT-A-CATH WITH ULTRASOUND;  Surgeon: Kieth Brightly Arta Bruce, MD;  Location: [facility];  Service: General;  Laterality: Right;    SOCIAL HISTORY: Social History   Socioeconomic History  . Marital status: Single    Spouse name: Not on file  . Number of children: Not on file  . Years of education: Not on file  . Highest education level: Not on file  Occupational History  . Not on file  Tobacco Use  . Smoking status: Former Smoker    Packs/day: 0.50    Years: 10.00    Pack years: 5.00    Types: Cigarettes  . Smokeless tobacco: Never Used  Vaping Use  . Vaping Use: Never used  Substance and Sexual Activity  . Alcohol use: Yes    Comment: seldom  . Drug use: No  . Sexual activity: Yes    Birth control/protection: None, Other-see comments    Comment: nuva ring  Other Topics Concern  . Not on file  Social History Narrative  . Not on file   Social Determinants of Health   Financial Resource Strain:   . Difficulty of Paying Living Expenses:   Food Insecurity:   . Worried About Charity fundraiser in the Last Year:   . Arboriculturist in the Last Year:   Transportation Needs:   . Film/video editor (Medical):   [name] Lack of Transportation (Non-Medical):   Physical Activity:   . Days of Exercise per Week:   . Minutes of Exercise per Session:   Stress:   . Feeling of Stress :   Social  Connections:   . Frequency of Communication with Friends and Family:   . Frequency of Social Gatherings with Friends and Family:   . Attends Religious Services:   . Active Member of Clubs or Organizations:   . Attends Archivist Meetings:   [name] Marital Status:   Intimate Partner Violence:   . Fear of Current or Ex-Partner:   . Emotionally Abused:   [name] Physically Abused:   . Sexually Abused:     FAMILY HISTORY: Family History  Problem Relation Age of Onset  . Heart disease Father   . Colon cancer Neg Hx   . Esophageal cancer Neg Hx     ALLERGIES:  has No Known Allergies.  MEDICATIONS:  Current Facility-Administered Medications  Medication Dose Route Frequency Provider Last Rate Last Admin  . acetaminophen (TYLENOL) tablet 1,000 mg  1,000 mg Oral Q8H Kinsinger, Arta Bruce, MD   1,000 mg at 04/13/20 0827  . amoxicillin-clavulanate (AUGMENTIN) 875-125 MG per tablet 1 tablet  1 tablet Oral Q12H Kinsinger, Arta Bruce, MD   1 tablet at 04/13/20 0827  . Chlorhexidine Gluconate Cloth 2 % PADS 6 each  6 each Topical Daily Kinsinger, Arta Bruce, MD   6 each at 04/13/20 [phone]  . diclofenac Sodium (VOLTAREN) 1 % topical gel 2 g  2 g Topical QID Kinsinger, Arta Bruce, MD   2 g at 04/13/20 0829  . enoxaparin (LOVENOX) injection 40 mg  40 mg Subcutaneous Q24H Kinsinger, Arta Bruce, MD   40 mg at 04/13/20 0827  . famotidine (PEPCID) tablet 20 mg  20 mg Oral BID Kinsinger, Arta Bruce, MD   20 mg at 04/13/20 0827  . HYDROmorphone (DILAUDID) injection 1-2 mg  1-2 mg Intravenous Q3H PRN Kinsinger, Arta Bruce, MD   2 mg at 04/13/20 1207  . lactated ringers infusion   Intravenous Continuous Kinsinger, Arta Bruce, MD 50 mL/hr at 04/13/20 0835 New Bag at 04/13/20 0835  . methocarbamol (ROBAXIN) tablet 1,000 mg  1,000 mg Oral QID Kinsinger, Arta Bruce, MD   1,000 mg at 04/13/20 0827  . ondansetron (ZOFRAN) tablet 4 mg  4 mg Oral Q6H PRN Norm Parcel, PA-C   4 mg at 04/10/20 1023   Or  .  ondansetron (ZOFRAN) injection 4 mg  4 mg Intravenous Q6H PRN Norm Parcel, PA-C   4 mg at 04/11/20 0132  . oxyCODONE (Oxy IR/ROXICODONE) immediate release tablet 5-10 mg  5-10 mg Oral Q4H PRN Kinsinger, Arta Bruce, MD   10 mg at 04/13/20 0827  . prochlorperazine (COMPAZINE) injection 10 mg  10 mg Intravenous Q4H PRN Kinsinger, Arta Bruce, MD   10 mg at 04/10/20 1153  . traMADol (ULTRAM) tablet 50 mg  50 mg Oral Q6H PRN Kinsinger, Arta Bruce, MD   50 mg at 04/11/20 1325    REVIEW OF SYSTEMS:   Constitutional: Denies fevers,  chills or abnormal night sweats Eyes: Denies blurriness of vision, double vision or watery eyes Ears, nose, mouth, throat, and face: Denies mucositis or sore throat Respiratory: Denies cough, dyspnea or wheezes Cardiovascular: Denies palpitation, chest discomfort or lower extremity swelling Gastrointestinal:  Denies nausea, heartburn or change in bowel habits Skin: Denies abnormal skin rashes Lymphatics: Denies new lymphadenopathy or easy bruising Neurological:Denies numbness, tingling or new weaknesses Behavioral/Psych: Mood is stable, no new changes  All other systems were reviewed with the patient and are negative.  PHYSICAL EXAMINATION: ECOG PERFORMANCE STATUS: 2 - Symptomatic, <50% confined to bed  Vitals:   04/13/20 0126 04/13/20 0608  BP: 123/81 126/81  Pulse: 64 64  Resp: 14 14  Temp: 98 F (36.7 C) 98.5 F (36.9 C)  SpO2: 98% 97%   Filed Weights   04/08/20 1132 04/09/20 0642 04/13/20 0720  Weight: 182 lb 12.2 oz (82.9 kg) 183 lb 3.2 oz (83.1 kg) 161 lb 3.2 oz (73.1 kg)    GENERAL:alert, no distress and comfortable SKIN: skin color, texture, turgor are normal, no rashes or significant lesions EYES: normal, conjunctiva are pink and non-injected, sclera clear OROPHARYNX:no exudate, no erythema and lips, buccal mucosa, and tongue normal  NECK: supple, thyroid normal size, non-tender, without nodularity LYMPH:  no palpable lymphadenopathy in the  cervical, axillary or inguinal LUNGS: clear to auscultation and percussion with normal breathing effort HEART: regular rate & rhythm and no murmurs and no lower extremity edema ABDOMEN:abdomen soft, (+) midline incision with wound vac on. (+) colostomy bag on left  Musculoskeletal:no cyanosis of digits and no clubbing  PSYCH: alert & oriented x 3 with fluent speech NEURO: no focal motor/sensory deficits  LABORATORY DATA:  I have reviewed the data as listed Lab Results  Component Value Date   WBC 9.9 04/13/2020   HGB 10.4 (L) 04/13/2020   HCT 33.7 (L) 04/13/2020   MCV 81.8 04/13/2020   PLT 440 (H) 04/13/2020   Recent Labs    04/06/20 0430 04/07/20 0513 04/11/20 0513 04/12/20 0438 04/13/20 0440  NA 141   < > 136 137 139  K 3.3*   < > 3.5 3.4* 4.3  CL 108   < > 103 101 100  CO2 26   < > 22 24 25   GLUCOSE 117*   < > 85 91 121*  BUN 19   < > <5* <5* <5*  CREATININE 0.44   < > 0.37* 0.41* 0.36*  CALCIUM 8.0*   < > 7.9* 8.0* 8.9  GFRNONAA >60   < > >60 >60 >60  GFRAA >60   < > >60 >60 >60  PROT 4.9*  --   --  5.7* 6.2*  ALBUMIN 1.9*  --   --  2.3* 2.4*  AST 14*  --   --  26 19  ALT 8  --   --  14 13  ALKPHOS 39  --   --  48 52  BILITOT 0.5  --   --  0.7 0.4   < > = values in this interval not displayed.    RADIOGRAPHIC STUDIES: I have personally reviewed the radiological images as listed and agreed with the findings in the report. CT ABDOMEN PELVIS W CONTRAST  Result Date: 03/31/2020 CLINICAL DATA:  39 year old female with history of left lower quadrant abdominal pain. EXAM: CT ABDOMEN AND PELVIS WITH CONTRAST TECHNIQUE: Multidetector CT imaging of the abdomen and pelvis was performed using the standard protocol following bolus administration of intravenous contrast. CONTRAST:  166mL OMNIPAQUE IOHEXOL 300 MG/ML  SOLN COMPARISON:  CT the abdomen and pelvis 03/12/2020. FINDINGS: Lower chest: Unremarkable. Hepatobiliary: Large hypovascular lesion in segment 7 of the liver  measuring 3.0 x 2.4 cm, increased in size compared to the prior study (previously 2.5 x 1.8 cm when measured in a similar fashion on prior examination). No other new hepatic lesions. No intra or extrahepatic biliary ductal dilatation. Gallbladder is normal in appearance. Pancreas: No pancreatic mass. No pancreatic ductal dilatation. No peripancreatic fluid collections or inflammatory changes. Spleen: Unremarkable. Adrenals/Urinary Tract: Bilateral kidneys and bilateral adrenal glands are unremarkable in appearance. No hydroureteronephrosis. Urinary bladder is normal in appearance. Stomach/Bowel: Normal appearance of the stomach. No pathologic dilatation of small bowel or colon. Previous the noted lesion in the mid sigmoid colon appears larger than the prior examination estimated to measure approximately 6.7 x 3.2 x 4.4 cm (coronal image 48 of series 4 and sagittal image 78 of series 5). Adjacent to this there is a very complex collection of gas and fluid which is now intimately associated with the left adnexa measuring approximately 6.5 x 6.2 x 6.6 cm (axial image 75 of series 2 and coronal image 52 of series 4). This collection has fluid of heterogeneous attenuation and multiple internal locules of gas, as well as multiple areas of internal enhancement. This is associated with a serpiginous structure with peripheral enhancement in the left adnexal region, likely to represent dilated fallopian tube which also contains gas and fluid. Notably, this collection appears to communicate with the previously described colonic mass across the midline best appreciated on axial image 70 of series 2. Normal appendix. Vascular/Lymphatic: No significant atherosclerotic disease, aneurysm or dissection noted in the abdominal or pelvic vasculature. Multiple enlarged lymph nodes in the pelvis measuring up to 1.2 cm in short axis adjacent to the inferior mesenteric artery (axial image 61 of series 2). Borderline enlarged  retroperitoneal lymph nodes are also noted measuring up to 8 mm in short axis. Reproductive: IUD present in the uterus. Uterus and right ovary are otherwise unremarkable in appearance. Large complex gas and fluid collection intimately associated with the left adnexa (discussed above). Tubular serpiginous area with rim enhancement also noted in the left adnexal region. Other: Small volume of ascites.  No pneumoperitoneum. Musculoskeletal: There are no aggressive appearing lytic or blastic lesions noted in the visualized portions of the skeleton. IMPRESSION: 1. Previously noted sigmoid colon mass appears increased in size, and again appears to be associated with a focal contained perforation which crosses the midline and has fistulized into the left adnexal region where there is now what appears to be a large left tubo-ovarian abscess, as detailed above. This is also associated with multiple enlarged lymph nodes in the pelvis measuring up to 1.2 cm in short axis and borderline enlarged retroperitoneal lymph nodes, concerning for metastatic disease. In addition, previously suspected metastatic lesion in segment 7 of the liver has enlarged. 2. Small volume of ascites. 3. Additional incidental findings, as above. These results were called by telephone at the time of interpretation on 03/31/2020 at 11:58 am to provider [facility], who verbally acknowledged these results. Electronically Signed   By: Vinnie Langton M.D.   On: 03/31/2020 12:00   DG CHEST PORT 1 VIEW  Result Date: 04/04/2020 CLINICAL DATA:  Preoperative evaluation for exploratory laparotomy EXAM: PORTABLE CHEST 1 VIEW COMPARISON:  03/31/2020 abdomen CT, 10/19/2012 chest x-ray FINDINGS: Lower lung volumes. Left lower lobe collapse/consolidation noted with an associated small left effusion. Difficult to  exclude left lower lung pneumonia. Normal heart size and vascularity. Trachea midline. No acute osseous finding. IMPRESSION: Left lower lobe retrocardiac  collapse/consolidation and small left effusion. Low lung volumes. Electronically Signed   By: Jerilynn Mages.  Shick M.D.   On: 04/04/2020 10:27   DG Abd Portable 1V  Result Date: 04/11/2020 CLINICAL DATA:  Abdominal pain, vomiting EXAM: PORTABLE ABDOMEN - 1 VIEW COMPARISON:  12/26/2016.  CT 03/31/2020 FINDINGS: Nonobstructive bowel gas pattern. Left lower quadrant ostomy noted. IUD present in the pelvis. No free air or organomegaly. No suspicious calcification. IMPRESSION: Left lower quadrant ostomy noted. No evidence of bowel obstruction. No acute findings. Electronically Signed   By: Rolm Baptise M.D.   On: 04/11/2020 11:05   DG C-Arm 1-60 Min-No Report  Result Date: 04/12/2020 Fluoroscopy was utilized by the requesting physician.  No radiographic interpretation.   DG C-Arm 1-60 Min-No Report  Result Date: 04/04/2020 Fluoroscopy was utilized by the requesting physician.  No radiographic interpretation.    ASSESSMENT & PLAN: 39 yo female   1. Sigmoid colon adenocarcinoma with perforation, [id number], with oligo liver lesion, s/p sigmoid colectomy with positive radial margin. 2.  Anemia, secondary to cancer and iron deficiency  3. Abdominal abscess secondary to sigmoid colon perforation    Recommendations: -Per surgical pathology findings, and CT scan findings with her in detail. -the liver lesion increased in size over the last month, which is highly suspicious for liver metastasis from colon cancer.  I recommend liver biopsy.  I spoke with Dr. Vernard Gambles today, biopsy is not able to be done today due to her Lovenox injection this morning.  I will arrange to be done next week as outpatient -I ordered a CT chest to complete staging -We discussed this is likely metastatic colon cancer, however her disease burden is low with audible liver lesion.  Abdominal adenopathy, likely related to her pelvic abscess and infection, certainly metastasis are not ruled out. -I recommend chemotherapy [id number] for 4-6  month, and will evaluate if she is a candidate for liver resection or ablation.  We discussed that up to 20% metastatic colon cancer can be cured if metastatic disease is limited  -due to her pelvic abscess and wound issue, her chemo is likely going to be delayed for a month or longer, depends on her recovery  -I will refer her to our SW and dietician  -I will set up her f/u with me in 2-3 weeks  -will check her iron level, please give one dose feraheme if ferritin <20  -I will call her mother to update her   All questions were answered. The patient knows to call the clinic with any problems, questions or concerns.       [name], MD 04/13/2020 1:38 PM

## 2020-04-13 NOTE — Progress Notes (Signed)
PROGRESS NOTE    Stephanie [name]  XBJ:[id number] DOB: Apr 23, 1981 DOA: 03/31/2020 PCP: Patient, [name]   Chief Complaint  Patient presents with  . Abdominal Pain    Brief Narrative: Stephanie [name] 39 y.o.femalewith medical history significant ofrecent admission from June 14 to June 18 with abdominal pain and abscess with Stephanie [name] question of sigmoidcolon malignancy versus diverticulitis. At that time she had an abnormal CT scan which also showed Stephanie [name] liver mass. She had Stephanie [name] barely elevated CEA at 4.9. She was placed on IV antibiotics, an attempt was made at liver biopsy however ultrasound suggested it might be Stephanie [name] hemangioma. An MRI was equivocal. She was sent home on Augmentin which she completed and was doing well with until the last few days. She has continued to have blood in her stool. Stools are soft and not fully formed. She is passing gas. She has had increasing nausea with emesis over the last 12 hours. She reports ongoing lower abdominal cramping that is worse in the left lower quadrant. Her pain was 10 out of 10 requiring IV Dilaudid in the ED. She has an IUD in place, no sexual partner for the last 1 year. She denies vaginal discharge, vaginal bleeding, dysuria, hematuria, chest pain, or shortness of breath. In the ED she was sent for CT of the abdomen and pelvis which showed enlarging sigmoid colonic mass, which appeared which appears to be associated with focal perforation and has fistulized into the left adnexal region involving the left TOA. There are multiple enlarged lymph nodes which are bigger than before measuring up to 1.2 cm. The previously suspected metastatic lesion in the liver is also enlarged, small volume ascites. Patient placed empirically on IV antibiotics.  GI and general surgery were consulted.  Patient for exploratory laparotomy and partial colectomy with urethral stent placement today 04/04/2020 per general surgery. Patient with some complaints of  shortness of breath the morning of 04/04/2020, concern for volume overload and as such chest x-ray obtained and patient given Stephanie [name] dose of IV Lasix, IV fluids discontinued.  She was admitted for abdominal pain, nausea, and vomiting and had imaging findings concerning for colonic mass with metastatic disease with tubo-ovarian abscess and focally contained perforation.  She's now s/p surgery with colectomy and end colostomy.  Cultures growing staph epidermidis.  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  Previously suspected mestatic lesion in segment 7 of the liver has enlarged.   General surgery consulted - now s/p colectomy and end colostomy on 04/04/20 (findings at surgery concerning for sigmoid colon cancer with pelvic abscess)- continue antibiotics Biopsies c/w invasive moderately differentiated adenocarcinoma, 6 cm, involving rectosigmoid junction.  Carcinoma invades into serosal surface  with perforation and associated serositis.  Radial resection margin is positive for carcinoma; proximal and distal margins are not involved.  Lymphovascular invasion is present.   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  One tumor deposit.  (mesenteric LN, resection) Metastatic adenocarcinoma to 1 of 6 LN's, 1 tumor deposit. Oncology c/s - appreciate assistance - follow CT chest - will need liver biopsy (plan to be done outpatient - or Monday if she remains in house at that time) ID c/s - recommending augmentin to treat for 2 weeks of abx from 7/11 when ready for d/c (has been narrowed to augmentin per surgery)  Abscess with staph epidermidis - pan sensitive Soft diet per surgery Port placed on 7/15  3.  Shortness of breath Secondary to volume overload. CXR from 7/7 with LLL retrocardiac collapse/consolidation and small L effusion Improved with lasix Continue to follow I/O, daily weights   4.  Nausea/vomiting/abdominal pain.   Due to sigmoid colon cancer with pelvic abscess Continue analgesia/antiemetics prn     # Right Side Pain: suspect this is musculoskeletal, trial of voltaren - improved  5.  Acute blood loss anemia/chronic iron deficiency anemia Patient noted to have some scant bright red blood per rectum noted on previous exam.  Hemoglobin stable around 9  GI saw earlier in hospitalization prior to surgery and at that time planned for colonoscopy in 6-8 weeks -> follow up with oncology and GI outpatient    6.  Moderate protein calorie malnutrition Supplements ordered Continue soft diet  7.  Hypokalemia Improved, follow   8. Urinary retention Foley catheter discontinued per general surgery on 04/05/2020 however patient noted to have urinary retention and foley was replaced Doing well without foley   DVT prophylaxis: lovenox Code Status: full  Family Communication: none at bedside Disposition:   Status is: Inpatient  Remains inpatient appropriate because:Inpatient level of care appropriate due to severity of illness   Dispo: The patient is from: Home              Anticipated d/c is to: pending              Anticipated d/c date is: > 3  days              Patient currently is not medically stable to d/c.   Consultants:   Surgery  Urology  ID  Oncology  Gastroenterology  Procedures:  Open sigmoid colectomy and end colostomy 7/7 by general surgery Cystoscopy, retrograde pyelogram with interpretation, bilateral temporary ureteral stent placement 7/7 by urology  7/15 insertion of right internal jugular port-[place]-cath with ultrasound and fluoro guidance Antimicrobials: Anti-infectives (From admission, onward)   Start     Dose/Rate Route Frequency Ordered Stop   04/12/20 1039  ceFAZolin (ANCEF) 2-4 GM/100ML-% IVPB       Note to Pharmacy: Randa Evens  : cabinet override      04/12/20 1039 04/12/20 1130   04/12/20 0730  ceFAZolin (ANCEF) IVPB 2g/100 mL premix        2 g 200 mL/hr over 30 Minutes Intravenous On call to O.R. 04/12/20 0728 04/12/20 1119   04/10/20 1330  amoxicillin-clavulanate (AUGMENTIN) 875-125 MG per tablet 1 tablet     Discontinue     1 tablet Oral Every 12 hours 04/10/20 1218 04/22/20 0959   04/08/20 1400  vancomycin (VANCOCIN) IVPB 1000 mg/200 mL premix  Status:  Discontinued        1,000 mg 200 mL/hr over 60 Minutes Intravenous Every 8 hours  04/08/20 1136 04/08/20 1322   04/08/20 0000  vancomycin (VANCOREADY) IVPB 750 mg/150 mL  Status:  Discontinued        750 mg 150 mL/hr over 60 Minutes Intravenous Every 8 hours 04/07/20 1628 04/08/20 1136   04/07/20 1645  vancomycin (VANCOREADY) IVPB 1500 mg/300 mL        1,500 mg 150 mL/hr over 120 Minutes Intravenous STAT 04/07/20 1638 04/07/20 1847   04/07/20 1630  vancomycin (VANCOREADY) IVPB 1500 mg/300 mL  Status:  Discontinued        1,500 mg 150 mL/hr over 120 Minutes Intravenous  Once 04/07/20 1627 04/07/20 1628   04/04/20 1045  cefoTEtan (CEFOTAN) 2 g in sodium chloride 0.9 % 100 mL IVPB        2 g 200 mL/hr over 30 Minutes Intravenous On call to O.R. 04/03/20 1639 04/04/20 1213   03/31/20 2000  ceFEPIme (MAXIPIME) 2 g in sodium chloride  0.9 % 100 mL IVPB  Status:  Discontinued        2 g 200 mL/hr over 30 Minutes Intravenous Every 8 hours 03/31/20 1408 04/10/20 0917   03/31/20 2000  metroNIDAZOLE (FLAGYL) IVPB 500 mg  Status:  Discontinued        500 mg 100 mL/hr over 60 Minutes Intravenous Every 8 hours 03/31/20 1410 04/10/20 0917   03/31/20 1600  ceFEPIme (MAXIPIME) 2 g in sodium chloride 0.9 % 100 mL IVPB  Status:  Discontinued        2 g 200 mL/hr over 30 Minutes Intravenous  Once 03/31/20 1549 03/31/20 1551   03/31/20 1200  piperacillin-tazobactam (ZOSYN) IVPB 3.375 g        3.375 g 100 mL/hr over 30 Minutes Intravenous  Once 03/31/20 1159 03/31/20 1341     Subjective: Anxious about diagnosis and what that means for her  Objective: Vitals:   04/13/20 0126 04/13/20 0608 04/13/20 0720 04/13/20 1411  BP: 123/81 126/81  116/78  Pulse: 64 64  72  Resp: 14 14  18   Temp: 98 F (36.7 C) 98.5 F (36.9 C)  97.8 F (36.6 C)  TempSrc: Oral Oral  Oral  SpO2: 98% 97%  100%  Weight:   73.1 kg   Height:        Intake/Output Summary (Last 24 hours) at 04/13/2020 1615 Last data filed at 04/13/2020 1400 Gross per 24 hour  Intake 1609.9 ml  Output 3050 ml  Net -1440.1 ml   Filed Weights   04/08/20 1132 04/09/20 0642 04/13/20 0720  Weight: 82.9 kg 83.1 kg 73.1 kg    Examination:  General: No acute distress. Cardiovascular: Heart sounds show Latricia Cerrito regular rate, and rhythm.  Lungs: Clear to auscultation bilaterally. Abdomen: Soft, nontender, nondistended.  Dressings in place, colostomy with brown stool Neurological: Alert and oriented 3. Moves all extremities 4 with equal strength. Cranial nerves II through XII grossly intact. Skin: Warm and dry. No rashes or lesions. Extremities: No clubbing or cyanosis. No edema.   Data Reviewed: I have personally reviewed following labs and imaging studies  CBC: Recent Labs  Lab 04/10/20 0801 04/10/20 1320 04/11/20 0513 04/12/20 0438 04/13/20 0440  WBC 10.7* 11.5* 10.5  9.8 9.9  NEUTROABS 8.5* 9.5* 8.6* 7.7 8.1*  HGB 9.7* 9.6* 9.7* 9.8* 10.4*  HCT 31.1* 30.8* 31.0* 31.6* 33.7*  MCV 81.2 81.1 80.7 81.7 81.8  PLT 285 292 290 366 440*    Basic Metabolic Panel: Recent Labs  Lab 04/07/20 0513 04/08/20 0513 04/09/20 0527 04/10/20 0506 04/11/20  1610 04/12/20 0438 04/13/20 0440  NA 137   < > 136 138 136 137 139  K 3.2*   < > 3.8 3.8 3.5 3.4* 4.3  CL 105   < > 104 104 103 101 100  CO2 25   < > 23 23 22 24 25   GLUCOSE 80   < > 87 83 85 91 121*  BUN 12   < > <5* <5* <5* <5* <5*  CREATININE 0.44   < > 0.47 0.48 0.37* 0.41* 0.36*  CALCIUM 7.4*   < > 7.7* 7.8* 7.9* 8.0* 8.9  MG 1.9  --  1.8 2.0  --  1.7 2.0  PHOS  --   --   --   --   --  3.8 3.7   < > = values in this interval not displayed.    GFR: Estimated Creatinine Clearance: 98.7 mL/min (Mahin Guardia) (by C-G formula based on SCr of 0.36 mg/dL (L)).  Liver Function Tests: Recent Labs  Lab 04/12/20 0438 04/13/20 0440  AST 26 19  ALT 14 13  ALKPHOS 48 52  BILITOT 0.7 0.4  PROT 5.7* 6.2*  ALBUMIN 2.3* 2.4*    CBG: No results for input(s): GLUCAP in the last 168 hours.   Recent Results (from the past 240 hour(s))  Surgical PCR screen     Status: None   Collection Time: 04/03/20  4:52 PM   Specimen: Nasal Mucosa; Nasal Swab  Result Value Ref Range Status   MRSA, PCR NEGATIVE NEGATIVE Final   Staphylococcus aureus NEGATIVE NEGATIVE Final    Comment: (NOTE) The Xpert SA Assay (FDA approved for NASAL specimens in patients 72 years of age and older), is one component of Erian Lariviere comprehensive surveillance program. It is not intended to diagnose infection nor to guide or monitor treatment. Performed at [facility], [place] [address], [place], [place] [address]   Aerobic/Anaerobic Culture (surgical/deep wound)     Status: None   Collection Time: 04/04/20 12:46 PM   Specimen: Abdomen; Abscess  Result Value Ref Range Status   Specimen Description   Final    ABDOMEN PERITONEAL  ABSCESS Performed at [place] [facility] [address] [place], [place] [address]    Special Requests   Final    NONE Performed at [facility], [place] [address], [place], [place] [address]    Gram Stain   Final    RARE WBC PRESENT, PREDOMINANTLY MONONUCLEAR NO ORGANISMS SEEN    Culture   Final    RARE STAPHYLOCOCCUS EPIDERMIDIS NO ANAEROBES ISOLATED Performed at [facility], [place] [address] [place], [place] [address]    Report Status 04/09/2020 FINAL  Final   Organism ID, Bacteria STAPHYLOCOCCUS EPIDERMIDIS  Final      Susceptibility   Staphylococcus epidermidis - MIC*    CIPROFLOXACIN <=0.5 SENSITIVE Sensitive     ERYTHROMYCIN <=0.25 SENSITIVE Sensitive     GENTAMICIN <=0.5 SENSITIVE Sensitive     OXACILLIN <=0.25 SENSITIVE Sensitive     TETRACYCLINE 2 SENSITIVE Sensitive     VANCOMYCIN 2 SENSITIVE Sensitive     TRIMETH/SULFA <=10 SENSITIVE Sensitive     CLINDAMYCIN <=0.25 SENSITIVE Sensitive     RIFAMPIN <=0.5 SENSITIVE Sensitive     Inducible Clindamycin NEGATIVE Sensitive     * RARE STAPHYLOCOCCUS EPIDERMIDIS         Radiology Studies: DG C-Arm 1-60 Min-No Report  Result Date: 04/12/2020 Fluoroscopy was utilized by the requesting physician.  No radiographic  interpretation.        Scheduled Meds: . acetaminophen  1,000 mg Oral Q8H  . amoxicillin-clavulanate  1 tablet Oral Q12H  . Chlorhexidine Gluconate Cloth  6 each Topical Daily  . diclofenac Sodium  2 g Topical QID  . enoxaparin (LOVENOX) injection  40 mg Subcutaneous Q24H  . famotidine  20 mg Oral BID  . methocarbamol  1,000 mg Oral QID   Continuous Infusions: . lactated ringers 50 mL/hr at 04/13/20 0835     LOS: 13 days    Time spent: over 30 min    Fayrene Helper, MD [facility]   To contact the attending provider between 7A-7P or the covering provider during after hours 7P-7A, please log into the web site [email or web address] and access using  universal [place] password for that web site. If you do not have the password, please call the hospital operator.  04/13/2020, 4:15 PM

## 2020-04-13 NOTE — Progress Notes (Signed)
Occupational Therapy Treatment Patient Details Name: Stephanie [name] MRN: [id number] DOB: 10-Dec-1980 Today's Date: 04/13/2020    History of present illness Pt is a 39 year old woman admitted on 03/31/20 with sepsis secondary to sigmoid colon cancer with pelvic abscess and invasive adenocarcinoma. Underwent sigmoid colectomy and colostomy on 7/7. Hospital course complicated by urinary retention.  Pt was admitted 6/14- 6/18 with abdominal pain and abscess with + liver mass.    OT comments  Patient progressing towards acute OT goals. Patient supervision level for bed mobility, functional ambulation to bathroom, sink side grooming/hygiene and transfer to recliner. Encourage OOB activity.    Follow Up Recommendations  Home health OT;Supervision - Intermittent (vs no f/u)    Equipment Recommendations  3 in 1 bedside commode       Precautions / Restrictions Precautions Precautions: Fall Precaution Comments: abdominal wound vac, colostomy Restrictions Weight Bearing Restrictions: No       Mobility Bed Mobility Overal bed mobility: Needs Assistance Bed Mobility: Rolling;Sidelying to Sit Rolling: Supervision Sidelying to sit: Supervision       General bed mobility comments: demonstrates adequate log roll technique  Transfers Overall transfer level: Needs assistance Equipment used: None Transfers: Sit to/from Stand Sit to Stand: Supervision         General transfer comment: no loss of balance noted    Balance Overall balance assessment: Needs assistance Sitting-balance support: No upper extremity supported;Feet supported Sitting balance-Leahy Scale: Good     Standing balance support: No upper extremity supported Standing balance-Leahy Scale: Fair                             ADL either performed or assessed with clinical judgement   ADL Overall ADL's : Needs assistance/impaired     Grooming: Oral care;Wash/dry face;Wash/dry  hands;Standing;Supervision/safety                   Armed forces technical officer: Supervision/safety;Ambulation Toilet Transfer Details (indicate cue type and reason): to recliner, supervision for safety and increased time for ambulation         Functional mobility during ADLs: Supervision/safety General ADL Comments: patient agreeable to sitting up in recliner this session               Cognition Arousal/Alertness: Awake/alert Behavior During Therapy: Flat affect Overall Cognitive Status: Within Functional Limits for tasks assessed                                                     Pertinent Vitals/ Pain       Pain Assessment: Faces Faces Pain Scale: Hurts even more Pain Location: abdomen Pain Descriptors / Indicators: Guarding;Grimacing Pain Intervention(s): Monitored during session         Frequency  Min 2X/week        Progress Toward Goals  OT Goals(current goals can now be found in the care plan section)  Progress towards OT goals: Progressing toward goals  Acute Rehab OT Goals Patient Stated Goal: to not have pain. go home OT Goal Formulation: With patient Time For Goal Achievement: 04/21/20 Potential to Achieve Goals: Good ADL Goals Pt Will Perform Grooming: with modified independence;standing Pt Will Perform Lower Body Bathing: with modified independence;sit to/from stand Pt Will Perform Lower Body Dressing: with modified independence;sit to/from stand Pt  Will Transfer to Toilet: with modified independence;ambulating;bedside commode (over toilet) Pt Will Perform Toileting - Clothing Manipulation and hygiene: with modified independence;sit to/from stand Pt Will Perform Tub/Shower Transfer: Tub transfer;with supervision;ambulating;3 in 1 Additional ADL Goal #1: Pt will perform bed mobility using log roll technique.  Plan Discharge plan remains appropriate       AM-PAC OT "6 Clicks" Daily Activity     Outcome Measure   Help from  another person eating meals?: None Help from another person taking care of personal grooming?: A Little Help from another person toileting, which includes using toliet, bedpan, or urinal?: A Little Help from another person bathing (including washing, rinsing, drying)?: A Little Help from another person to put on and taking off regular upper body clothing?: A Little Help from another person to put on and taking off regular lower body clothing?: A Little 6 Click Score: 19    End of Session  OT Visit Diagnosis: Unsteadiness on feet (R26.81);Pain;Muscle weakness (generalized) (M62.81) Pain - part of body:  (abdomen)   Activity Tolerance Patient tolerated treatment well   Patient Left in chair;with call bell/phone within reach;with nursing/sitter in room   Nurse Communication Mobility status        Time: 5501-5868 OT Time Calculation (min): 11 min  Charges: OT General Charges $OT Visit: 1 Visit OT Treatments $Self Care/Home Management : 8-22 mins  Delbert Phenix OT Pager: [place] 04/13/2020, 12:38 PM

## 2020-04-13 NOTE — Progress Notes (Signed)
Progress Note: General Surgery Service   Chief Complaint/Subjective: Tolerating diet, ambulating better, pain better controlled  Objective: Vital signs in last 24 hours: Temp:  [98 F (36.7 C)-98.9 F (37.2 C)] 98.5 F (36.9 C) (07/16 0608) Pulse Rate:  [64-90] 64 (07/16 0608) Resp:  [10-18] 14 (07/16 0608) BP: (120-134)/(74-81) 126/81 (07/16 0608) SpO2:  [95 %-100 %] 97 % (07/16 3710) Weight:  [73.1 kg] 73.1 kg (07/16 0720) Last BM Date: 03/11/20  Intake/Output from previous day: 07/15 0701 - 07/16 0700 In: 2359.9 [P.O.:720; I.V.:1539.9; IV Piggyback:100] Out: 2250 [Urine:2000; Stool:250] Intake/Output this shift: No intake/output data recorded.  Gen: NAD  Resp: nonlabored  Card: RRR  Abd: soft, vac in place, ostomy pink with liquid stool in bag  Lab Results: CBC  Recent Labs    04/12/20 0438 04/13/20 0440  WBC 9.8 9.9  HGB 9.8* 10.4*  HCT 31.6* 33.7*  PLT 366 440*   BMET Recent Labs    04/12/20 0438 04/13/20 0440  NA 137 139  K 3.4* 4.3  CL 101 100  CO2 24 25  GLUCOSE 91 121*  BUN <5* <5*  CREATININE 0.41* 0.36*  CALCIUM 8.0* 8.9   PT/INR No results for input(s): LABPROT, INR in the last 72 hours. ABG No results for input(s): PHART, HCO3 in the last 72 hours.  Invalid input(s): PCO2, PO2  Anti-infectives: Anti-infectives (From admission, onward)   Start     Dose/Rate Route Frequency Ordered Stop   04/12/20 1039  ceFAZolin (ANCEF) 2-4 GM/100ML-% IVPB       Note to Pharmacy: Randa Evens  : cabinet override      04/12/20 1039 04/12/20 1130   04/12/20 0730  ceFAZolin (ANCEF) IVPB 2g/100 mL premix        2 g 200 mL/hr over 30 Minutes Intravenous On call to O.R. 04/12/20 0728 04/12/20 1119   04/10/20 1330  amoxicillin-clavulanate (AUGMENTIN) 875-125 MG per tablet 1 tablet     Discontinue     1 tablet Oral Every 12 hours 04/10/20 1218 04/22/20 0959   04/08/20 1400  vancomycin (VANCOCIN) IVPB 1000 mg/200 mL premix  Status:  Discontinued         1,000 mg 200 mL/hr over 60 Minutes Intravenous Every 8 hours 04/08/20 1136 04/08/20 1322   04/08/20 0000  vancomycin (VANCOREADY) IVPB 750 mg/150 mL  Status:  Discontinued        750 mg 150 mL/hr over 60 Minutes Intravenous Every 8 hours 04/07/20 1628 04/08/20 1136   04/07/20 1645  vancomycin (VANCOREADY) IVPB 1500 mg/300 mL        1,500 mg 150 mL/hr over 120 Minutes Intravenous STAT 04/07/20 1638 04/07/20 1847   04/07/20 1630  vancomycin (VANCOREADY) IVPB 1500 mg/300 mL  Status:  Discontinued        1,500 mg 150 mL/hr over 120 Minutes Intravenous  Once 04/07/20 1627 04/07/20 1628   04/04/20 1045  cefoTEtan (CEFOTAN) 2 g in sodium chloride 0.9 % 100 mL IVPB        2 g 200 mL/hr over 30 Minutes Intravenous On call to O.R. 04/03/20 1639 04/04/20 1213   03/31/20 2000  ceFEPIme (MAXIPIME) 2 g in sodium chloride 0.9 % 100 mL IVPB  Status:  Discontinued        2 g 200 mL/hr over 30 Minutes Intravenous Every 8 hours 03/31/20 1408 04/10/20 0917   03/31/20 2000  metroNIDAZOLE (FLAGYL) IVPB 500 mg  Status:  Discontinued        500 mg 100  mL/hr over 60 Minutes Intravenous Every 8 hours 03/31/20 1410 04/10/20 0917   03/31/20 1600  ceFEPIme (MAXIPIME) 2 g in sodium chloride 0.9 % 100 mL IVPB  Status:  Discontinued        2 g 200 mL/hr over 30 Minutes Intravenous  Once 03/31/20 1549 03/31/20 1551   03/31/20 1200  piperacillin-tazobactam (ZOSYN) IVPB 3.375 g        3.375 g 100 mL/hr over 30 Minutes Intravenous  Once 03/31/20 1159 03/31/20 1341      Medications: Scheduled Meds: . acetaminophen  1,000 mg Oral Q8H  . amoxicillin-clavulanate  1 tablet Oral Q12H  . Chlorhexidine Gluconate Cloth  6 each Topical Daily  . diclofenac Sodium  2 g Topical QID  . enoxaparin (LOVENOX) injection  40 mg Subcutaneous Q24H  . famotidine  20 mg Oral BID  . methocarbamol  1,000 mg Oral QID   Continuous Infusions: . lactated ringers 50 mL/hr at 04/13/20 0835   PRN Meds:.HYDROmorphone (DILAUDID) injection,  ondansetron **OR** ondansetron (ZOFRAN) IV, oxyCODONE, prochlorperazine, traMADol  Assessment/Plan: s/p Procedure(s): INSERTION PORT-A-CATH WITH ULTRASOUND 04/12/2020 Continue ambulation Continue diet Discharge planning, candidate for discharge over the weekend    LOS: 13 days   Mickeal Skinner, MD [facility]

## 2020-04-13 NOTE — Consult Note (Signed)
[place] Nurse wound follow up Wound type:surgical  Measurement: 9cm x 2.5cm x 1.5cm  Wound bed:100% pink, subcutaneous tissue Drainage (amount, consistency, odor)minimal, no odor  Periwound: intact; colostomy to the left lateral aspect of the midline incision  Dressing procedure/placement/frequency: Removed old NPWT dressing Ostomy barrier ring 1/4 piece used to fill umbilicus for seal of dressing Filled wound with 1___ piece of black foam Sealed NPWT dressing at 125mm HG Patient received IV pain medication per bedside nurse prior to dressing change Patient tolerated procedure well  WOC nurse will continue to provide NPWT dressing changed due to the complexity of the dressing change.   [place] Nurse ostomy consult note Stoma type/location: LLQ, colostomy Stomal assessment/size: 1 1/4" round, budded, pink, pale Peristomal assessment: intact, MARSI noted to the left lateral aspect of the tape border, foam in place left in place to insulate and protect site Treatment options for stomal/peristomal skin: using 2" barrier ring and foam dressings for MARSI Output liquid green Ostomy pouching: 2pc. 2 2/4" pouch with 2" barrier ring  Education provided:  Explained stoma characteristics (budded, flush, color, texture, care) Demonstrated pouch change (cutting new skin barrier: allowed patient to cut new skin barrier;  cleaning peristomal skin and stoma, use of barrier ring, patient declined placement of barrier ring) Education on emptying when 1/3 to 1/2 full and how to empty Demonstrated "burping" flatus from pouch Demonstrated use of wick to clean spout  Discussed bathing, diet, gas, medication use, constipation Indigent paperwork in the room for patient, explained importance of contacting [place] to initiate program Enrolled patient in [place] program: Yes 8 pouching systems in the room for possible DC to home.   [place] Nurse will follow along with you for continued support with  ostomy teaching, care and NPWT dressing change.  Drago Hammonds [facility] MSN, [place], [place], [place], [place]

## 2020-04-13 NOTE — Anesthesia Postprocedure Evaluation (Signed)
Anesthesia Post Note  Patient: Stephanie [name]  Procedure(s) Performed: INSERTION PORT-A-CATH WITH ULTRASOUND (Right Chest)     Patient location during evaluation: PACU Anesthesia Type: General Level of consciousness: awake and sedated Pain management: pain level controlled Vital Signs Assessment: post-procedure vital signs reviewed and stable Respiratory status: spontaneous breathing Cardiovascular status: stable Postop Assessment: no apparent nausea or vomiting Anesthetic complications: no   No complications documented.  Last Vitals:  Vitals:   04/13/20 0126 04/13/20 0608  BP: 123/81 126/81  Pulse: 64 64  Resp: 14 14  Temp: 36.7 C 36.9 C  SpO2: 98% 97%    Last Pain:  Vitals:   04/13/20 0829  TempSrc:   PainSc: [name]

## 2020-04-13 NOTE — Progress Notes (Signed)
[place] CSW Progress Notes  Request from Dr Burr Medico to follow up on insurance and parenting concerns.  Per [employer], she has been screened by [employer] and they're monitoring the account. Gaspar Cola is handling case.  CSWs will follow up re other concerns when patient is outpatient.  Edwyna Shell, LCSW Clinical Social Worker Phone:  [phone] Cell:  [phone]

## 2020-04-13 NOTE — Progress Notes (Signed)
Patient ID: Stephanie [name], female   DOB: 08-03-1981, 39 y.o.   MRN: [id number] Request received for liver lesion biopsy on patient; latest imaging studies have been reviewed by Dr. Vernard Gambles and biopsy has been approved.  Patient received Lovenox this morning, therefore biopsy cannot be done until next week.  If patient remains in house procedure can be done on Monday at [facility].  If she is discharged over the weekend an outpatient biopsy order has already been placed and hopefully can be done next week as well.  Above discussed with patient.

## 2020-04-14 LAB — PHOSPHORUS: Phosphorus: 4.4 mg/dL (ref 2.5–4.6)

## 2020-04-14 LAB — COMPREHENSIVE METABOLIC PANEL
ALT: 14 U/L (ref 0–44)
AST: 21 U/L (ref 15–41)
Albumin: 2.3 g/dL — ABNORMAL LOW (ref 3.5–5.0)
Alkaline Phosphatase: 43 U/L (ref 38–126)
Anion gap: 9 (ref 5–15)
BUN: 6 mg/dL (ref 6–20)
CO2: 28 mmol/L (ref 22–32)
Calcium: 8.3 mg/dL — ABNORMAL LOW (ref 8.9–10.3)
Chloride: 101 mmol/L (ref 98–111)
Creatinine, Ser: 0.32 mg/dL — ABNORMAL LOW (ref 0.44–1.00)
GFR calc Af Amer: >60 mL/min (ref >60)
GFR calc non Af Amer: >60 mL/min (ref >60)
Glucose, Bld: 89 mg/dL (ref 70–99)
Potassium: 3.3 mmol/L — ABNORMAL LOW (ref 3.5–5.1)
Sodium: 138 mmol/L (ref 135–145)
Total Bilirubin: 0.5 mg/dL (ref 0.3–1.2)
Total Protein: 5.7 g/dL — ABNORMAL LOW (ref 6.5–8.1)

## 2020-04-14 LAB — CBC WITH DIFFERENTIAL/PLATELET
Abs Immature Granulocytes: 0.06 10*3/uL (ref 0.00–0.07)
Basophils Absolute: 0.1 10*3/uL (ref 0.0–0.1)
Basophils Relative: 1 %
Eosinophils Absolute: 0.1 10*3/uL (ref 0.0–0.5)
Eosinophils Relative: 1 %
HCT: 29.6 % — ABNORMAL LOW (ref 36.0–46.0)
Hemoglobin: 9.1 g/dL — ABNORMAL LOW (ref 12.0–15.0)
Immature Granulocytes: 1 %
Lymphocytes Relative: 24 %
Lymphs Abs: 1.7 10*3/uL (ref 0.7–4.0)
MCH: 25.5 pg — ABNORMAL LOW (ref 26.0–34.0)
MCHC: 30.7 g/dL (ref 30.0–36.0)
MCV: 82.9 fL (ref 80.0–100.0)
Monocytes Absolute: 0.8 10*3/uL (ref 0.1–1.0)
Monocytes Relative: 11 %
Neutro Abs: 4.5 10*3/uL (ref 1.7–7.7)
Neutrophils Relative %: 62 %
Platelets: 432 10*3/uL — ABNORMAL HIGH (ref 150–400)
RBC: 3.57 MIL/uL — ABNORMAL LOW (ref 3.87–5.11)
RDW: 22.7 % — ABNORMAL HIGH (ref 11.5–15.5)
WBC: 7.2 10*3/uL (ref 4.0–10.5)
nRBC: 0 % (ref 0.0–0.2)

## 2020-04-14 LAB — MAGNESIUM: Magnesium: 1.7 mg/dL (ref 1.7–2.4)

## 2020-04-14 LAB — BRAIN NATRIURETIC PEPTIDE: B Natriuretic Peptide: 35.7 pg/mL (ref 0.0–100.0)

## 2020-04-14 MED ORDER — POTASSIUM CHLORIDE CRYS ER 20 MEQ PO TBCR
40.0000 meq | EXTENDED_RELEASE_TABLET | Freq: Once | ORAL | Status: AC
  Administered 2020-04-14: 40 meq via ORAL
  Filled 2020-04-14: qty 2

## 2020-04-14 NOTE — Progress Notes (Signed)
Assessment & Plan: POD#10 - status post ex lap with Hartmann's resection for sigmoid perforation - 04/04/2020 - Dr. Barry Dienes POD#2 - status post port placement - 04/12/2020 - Dr. Eulis Manly dressing in place - TOC/SW arranging [facility] for discharge  Colostomy care  Perc liver biopsy to be scheduled - possibly Monday  Patient stable for discharge home from surgical standpoint.  Will need wound care and colostomy care - HHN to be arranged.  If VAC not possible, can switch to BID wet to dry dressing changes.  Discussed with Dr. Florene Glen.  Surgery will follow and assist with discharge plans if possible.        Armandina Gemma, MD       [facility]       Office: [phone]   Chief Complaint: Perforated sigmoid adenocarcinoma  Subjective: Patient in bed, comfortable.  Tolerating diet.  Objective: Vital signs in last 24 hours: Temp:  [97.5 F (36.4 C)-97.9 F (36.6 C)] 97.5 F (36.4 C) (07/17 0648) Pulse Rate:  [60-72] 63 (07/17 0648) Resp:  [18] 18 (07/17 0648) BP: (116-125)/(71-81) 124/81 (07/17 0648) SpO2:  [96 %-100 %] 99 % (07/17 0648) Last BM Date: 04/13/20  Intake/Output from previous day: 07/16 0701 - 07/17 0700 In: 2107.7 [P.O.:1080; I.V.:1027.7] Out: 1750 [Urine:1750] Intake/Output this shift: Total I/O In: 247.9 [I.V.:247.9] Out: -   Physical Exam: HEENT - sclerae clear, mucous membranes moist Neck - soft Chest - clear bilaterally Cor - RRR Abdomen - soft without distension; dressing at drain site changed; ostomy viable, minimal in bag Ext - no edema, non-tender Neuro - alert & oriented, no focal deficits  Lab Results:  Recent Labs    04/13/20 0440 04/14/20 0519  WBC 9.9 7.2  HGB 10.4* 9.1*  HCT 33.7* 29.6*  PLT 440* 432*   BMET Recent Labs    04/13/20 0440 04/14/20 0519  NA 139 138  K 4.3 3.3*  CL 100 101  CO2 25 28  GLUCOSE 121* 89  BUN <5* 6  CREATININE 0.36* 0.32*  CALCIUM 8.9 8.3*   PT/INR No results for input(s):  LABPROT, INR in the last 72 hours. Comprehensive Metabolic Panel:    Component Value Date/Time   NA 138 04/14/2020 0519   NA 139 04/13/2020 0440   K 3.3 (L) 04/14/2020 0519   K 4.3 04/13/2020 0440   CL 101 04/14/2020 0519   CL 100 04/13/2020 0440   CO2 28 04/14/2020 0519   CO2 25 04/13/2020 0440   BUN 6 04/14/2020 0519   BUN <5 (L) 04/13/2020 0440   CREATININE 0.32 (L) 04/14/2020 0519   CREATININE 0.36 (L) 04/13/2020 0440   GLUCOSE 89 04/14/2020 0519   GLUCOSE 121 (H) 04/13/2020 0440   CALCIUM 8.3 (L) 04/14/2020 0519   CALCIUM 8.9 04/13/2020 0440   AST 21 04/14/2020 0519   AST 19 04/13/2020 0440   ALT 14 04/14/2020 0519   ALT 13 04/13/2020 0440   ALKPHOS 43 04/14/2020 0519   ALKPHOS 52 04/13/2020 0440   BILITOT 0.5 04/14/2020 0519   BILITOT 0.4 04/13/2020 0440   PROT 5.7 (L) 04/14/2020 0519   PROT 6.2 (L) 04/13/2020 0440   ALBUMIN 2.3 (L) 04/14/2020 0519   ALBUMIN 2.4 (L) 04/13/2020 0440    Studies/Results: CT CHEST WO CONTRAST  Result Date: 04/13/2020 CLINICAL DATA:  Colorectal cancer, staging examination, abdominal pain EXAM: CT CHEST WITHOUT CONTRAST TECHNIQUE: Multidetector CT imaging of the chest was performed following the standard protocol without IV  contrast. COMPARISON:  None. FINDINGS: Cardiovascular: Right internal jugular chest port tip is seen within the right atrium. Cardiac size within normal limits. No pericardial effusion. No significant coronary artery calcification. Central pulmonary arteries are of normal caliber. Thoracic aorta is unremarkable on this noncontrast examination. Mediastinum/Nodes: No pathologic thoracic adenopathy. Lungs/Pleura: Small right and small to moderate left pleural effusions have developed with associated bibasilar compressive atelectasis with subtotal collapse of the left lower lobe. No focal pulmonary nodules within the aerated pulmonary parenchyma. No pneumothorax. Central airways are widely patent. Upper Abdomen: Hypoechoic mass  within the right hepatic lobe in keeping with a hepatic metastasis is better seen on prior abdominal CT examination of 03/31/2020, but appears grossly unchanged measuring roughly 23 mm 2028 mm on axial image # 121/2. Limited images of the upper abdomen are otherwise unremarkable. Musculoskeletal: No lytic or blastic bone lesions are seen. IMPRESSION: Interval development of bilateral pleural effusions, left slightly greater than right, with resultant bibasilar atelectasis including subtotal collapse of the left lower lobe. No evidence of intrathoracic metastatic disease, though evaluation of the collapsed parenchyma is limited. Hepatic metastasis again demonstrated. Electronically Signed   By: Fidela Salisbury MD   On: 04/13/2020 19:57   DG C-Arm 1-60 Min-No Report  Result Date: 04/12/2020 Fluoroscopy was utilized by the requesting physician.  No radiographic interpretation.      Armandina Gemma 04/14/2020  Patient ID: Stephanie [name], female   DOB: 06-Jul-1981, 39 y.o.   MRN: [id number]

## 2020-04-14 NOTE — Progress Notes (Signed)
PROGRESS NOTE    Stephanie [name]  XBJ:[id number] DOB: 1981-05-02 DOA: 03/31/2020 PCP: Patient, [name]   Chief Complaint  Patient presents with  . Abdominal Pain    Brief Narrative: Stephanie [name] 39 y.o.femalewith medical history significant ofrecent admission from June 14 to June 18 with abdominal pain and abscess with [name] question of sigmoidcolon malignancy versus diverticulitis. At that time she had an abnormal CT scan which also showed [name] liver mass. She had [name] barely elevated CEA at 4.9. She was placed on IV antibiotics, an attempt was made at liver biopsy however ultrasound suggested it might be [name] hemangioma. An MRI was equivocal. She was sent home on Augmentin which she completed and was doing well with until the last few days. She has continued to have blood in her stool. Stools are soft and not fully formed. She is passing gas. She has had increasing nausea with emesis over the last 12 hours. She reports ongoing lower abdominal cramping that is worse in the left lower quadrant. Her pain was 10 out of 10 requiring IV Dilaudid in the ED. She has an IUD in place, no sexual partner for the last 1 year. She denies vaginal discharge, vaginal bleeding, dysuria, hematuria, chest pain, or shortness of breath. In the ED she was sent for CT of the abdomen and pelvis which showed enlarging sigmoid colonic mass, which appeared which appears to be associated with focal perforation and has fistulized into the left adnexal region involving the left TOA. There are multiple enlarged lymph nodes which are bigger than before measuring up to 1.2 cm. The previously suspected metastatic lesion in the liver is also enlarged, small volume ascites. Patient placed empirically on IV antibiotics.  GI and general surgery were consulted.  Patient for exploratory laparotomy and partial colectomy with urethral stent placement today 04/04/2020 per general surgery. Patient with some complaints of  shortness of breath the morning of 04/04/2020, concern for volume overload and as such chest x-ray obtained and patient given [name] dose of IV Lasix, IV fluids discontinued.  She was admitted for abdominal pain, nausea, and vomiting and had imaging findings concerning for colonic mass with metastatic disease with tubo-ovarian abscess and focally contained perforation.  She's now s/p surgery with colectomy and end colostomy.  Cultures growing staph epidermidis.  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  Previously suspected mestatic lesion in segment 7 of the liver has enlarged.   General surgery consulted - now s/p colectomy and end colostomy on 04/04/20 (findings at surgery concerning for sigmoid colon cancer with pelvic abscess)- continue antibiotics Biopsies c/w invasive moderately differentiated adenocarcinoma, 6 cm, involving rectosigmoid junction.  Carcinoma invades into serosal surface  with perforation and associated serositis.  Radial resection margin is positive for carcinoma; proximal and distal margins are not involved.  Lymphovascular invasion is present.   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  One tumor deposit.  (mesenteric LN, resection) Metastatic adenocarcinoma to 1 of 6 LN's, 1 tumor deposit. Oncology c/s - appreciate assistance - follow CT chest - bilateral pleural effusions L>R, no evidence of intrathoracic metastatic disease - will need liver biopsy, will plan for Monday ID c/s - recommending augmentin to treat for 2 weeks of abx from 7/11 when ready for d/c (has been narrowed to augmentin per surgery)  Abscess with staph epidermidis - pan sensitive Per surgery, will need wound care and colostomy care at home - per CM, [facility] (charity home health agency) has declined patient - they are checking with other home health agencies to see who can accept pt under charity (if vac not possible, plan for BID wet to dry dressing changes). Soft diet per surgery Port placed on 7/15  3.  Shortness of breath Secondary to volume overload. CXR from 7/7 with LLL retrocardiac collapse/consolidation and small L effusion Improved with lasix Continue to follow I/O, daily weights   4.  Nausea/vomiting/abdominal pain.   Due to sigmoid colon cancer with pelvic abscess Continue analgesia/antiemetics prn     # Right Side Pain: suspect this is musculoskeletal, trial of voltaren - improved  5.  Acute blood loss anemia/chronic iron deficiency anemia Patient noted to have some scant bright red blood per rectum noted on previous exam.  Hemoglobin stable around 9  GI saw earlier in hospitalization prior to surgery and at that time planned for colonoscopy in 6-8 weeks -> follow up with oncology and GI outpatient    6.  Moderate protein calorie malnutrition Supplements ordered Continue soft diet  7.  Hypokalemia Improved, follow   8. Urinary retention Foley catheter discontinued per general surgery on 04/05/2020 however patient noted to have urinary retention and foley was replaced Doing well without foley   DVT prophylaxis: lovenox Code Status: full   Family Communication: none at bedside Disposition:   Status is: Inpatient  Remains inpatient appropriate because:Inpatient level of care appropriate due to severity of illness   Dispo: The patient is from: Home              Anticipated d/c is to: pending              Anticipated d/c date is: > 3 days              Patient currently is not medically stable to d/c.   Consultants:   Surgery  Urology  ID  Oncology  Gastroenterology  Procedures:  Open sigmoid colectomy and end colostomy 7/7 by general surgery Cystoscopy, retrograde pyelogram with interpretation, bilateral temporary ureteral stent placement 7/7 by urology  7/15 insertion of right internal jugular port-Lamontae Ricardo-cath with ultrasound and fluoro guidance Antimicrobials: Anti-infectives (From admission, onward)   Start     Dose/Rate Route Frequency Ordered Stop   04/12/20 1039  ceFAZolin (ANCEF) 2-4 GM/100ML-% IVPB       Note to Pharmacy: Randa Evens  : cabinet override      04/12/20 1039 04/12/20 1130   04/12/20 0730  ceFAZolin (ANCEF) IVPB 2g/100 mL premix        2 g 200 mL/hr over 30 Minutes Intravenous On call to O.R. 04/12/20 0728 04/12/20 1119   04/10/20 1330  amoxicillin-clavulanate (AUGMENTIN) 875-125 MG per tablet 1 tablet  Discontinue     1 tablet Oral Every 12 hours 04/10/20 1218 04/22/20 0959   04/08/20 1400  vancomycin (VANCOCIN) IVPB 1000 mg/200 mL premix  Status:  Discontinued        1,000 mg 200 mL/hr over 60 Minutes Intravenous Every 8 hours 04/08/20 1136 04/08/20 1322   04/08/20 0000  vancomycin (VANCOREADY) IVPB 750 mg/150 mL  Status:  Discontinued        750 mg 150 mL/hr over 60 Minutes Intravenous Every 8 hours 04/07/20 1628 04/08/20 1136   04/07/20 1645  vancomycin (VANCOREADY) IVPB 1500 mg/300 mL        1,500 mg 150 mL/hr over 120 Minutes Intravenous STAT 04/07/20 1638 04/07/20 1847   04/07/20 1630  vancomycin (VANCOREADY) IVPB 1500 mg/300 mL  Status:  Discontinued        1,500  mg 150 mL/hr over 120 Minutes Intravenous  Once 04/07/20 1627 04/07/20 1628   04/04/20 1045  cefoTEtan (CEFOTAN) 2 g in sodium chloride 0.9 % 100 mL IVPB        2 g 200 mL/hr over 30 Minutes Intravenous On call to O.R. 04/03/20 1639 04/04/20 1213   03/31/20 2000  ceFEPIme (MAXIPIME) 2 g in sodium chloride 0.9 % 100 mL IVPB  Status:  Discontinued        2 g 200 mL/hr over 30 Minutes Intravenous Every 8 hours 03/31/20 1408 04/10/20 0917   03/31/20 2000  metroNIDAZOLE (FLAGYL) IVPB 500 mg  Status:  Discontinued        500 mg 100 mL/hr over 60 Minutes Intravenous Every 8 hours 03/31/20 1410 04/10/20 0917   03/31/20 1600  ceFEPIme (MAXIPIME) 2 g in sodium chloride 0.9 % 100 mL IVPB  Status:  Discontinued        2 g 200 mL/hr over 30 Minutes Intravenous  Once 03/31/20 1549 03/31/20 1551   03/31/20 1200  piperacillin-tazobactam (ZOSYN) IVPB 3.375 g        3.375 g 100 mL/hr over 30 Minutes Intravenous  Once 03/31/20 1159 03/31/20 1341     Subjective: Some cramping pain  Objective: Vitals:   04/13/20 0720 04/13/20 1411 04/13/20 2137 04/14/20 0648  BP:  116/78 125/71 124/81  Pulse:  72 60 63  Resp:  18 18 18   Temp:  97.8 F (36.6 C) 97.9 F (36.6 C) (!) 97.5 F (36.4 C)  TempSrc:  Oral    SpO2:  100% 96% 99%  Weight: 73.1 kg     Height:        Intake/Output Summary (Last 24 hours) at 04/14/2020 1313 Last data filed at 04/14/2020 1000 Gross per 24 hour  Intake 2025.57 ml  Output 1550 ml  Net 475.57 ml   Filed Weights   04/08/20 1132 04/09/20 0642 04/13/20 0720  Weight: 82.9 kg 83.1 kg 73.1 kg    Examination:  General: No acute distress. Cardiovascular: Heart sounds show Krissie Merrick regular rate, and rhythm. Lungs: Clear to auscultation bilaterally Abdomen: Soft, nontender, dressing in place, ostomy Neurological: Alert and oriented 3. Moves all extremities 4. Cranial nerves II through XII grossly intact. Skin: Warm and dry. No rashes or lesions. Extremities: No clubbing or  cyanosis. No edema.   Data Reviewed: I have personally reviewed following labs and imaging studies  CBC: Recent Labs  Lab 04/10/20 1320 04/11/20 0513 04/12/20 0438 04/13/20 0440 04/14/20 0519  WBC 11.5* 10.5 9.8 9.9 7.2  NEUTROABS 9.5* 8.6* 7.7 8.1* 4.5  HGB 9.6* 9.7* 9.8* 10.4* 9.1*  HCT 30.8* 31.0* 31.6*  33.7* 29.6*  MCV 81.1 80.7 81.7 81.8 82.9  PLT 292 290 366 440* 432*    Basic Metabolic Panel: Recent Labs  Lab 04/09/20 0527 04/09/20 0527 04/10/20 0506 04/11/20 0513 04/12/20 0438 04/13/20 0440 04/14/20 0519  NA 136   < > 138 136 137 139 138  K 3.8   < > 3.8 3.5 3.4* 4.3 3.3*  CL 104   < > 104 103 101 100 101  CO2 23   < > 23 22 24 25 28   GLUCOSE 87   < > 83 85 91 121* 89  BUN <5*   < > <5* <5* <5* <5* 6  CREATININE 0.47   < > 0.48 0.37* 0.41* 0.36* 0.32*  CALCIUM 7.7*   < > 7.8* 7.9* 8.0* 8.9 8.3*  MG 1.8  --  2.0  --  1.7 2.0 1.7  PHOS  --   --   --   --  3.8 3.7 4.4   < > = values in this interval not displayed.    GFR: Estimated Creatinine Clearance: 98.7 mL/min (Vihana Kydd) (by C-G formula based on SCr of 0.32 mg/dL (L)).  Liver Function Tests: Recent Labs  Lab 04/12/20 0438 04/13/20 0440 04/14/20 0519  AST 26 19 21   ALT 14 13 14   ALKPHOS 48 52 43  BILITOT 0.7 0.4 0.5  PROT 5.7* 6.2* 5.7*  ALBUMIN 2.3* 2.4* 2.3*    CBG: No results for input(s): GLUCAP in the last 168 hours.   No results found for this or any previous visit (from the past 240 hour(s)).       Radiology Studies: CT CHEST WO CONTRAST  Result Date: 04/13/2020 CLINICAL DATA:  Colorectal cancer, staging examination, abdominal pain EXAM: CT CHEST WITHOUT CONTRAST TECHNIQUE: Multidetector CT imaging of the chest was performed following the standard protocol without IV contrast. COMPARISON:  None. FINDINGS: Cardiovascular: Right internal jugular chest port tip is seen within the right atrium. Cardiac size within normal limits. No pericardial effusion. No significant coronary artery  calcification. Central pulmonary arteries are of normal caliber. Thoracic aorta is unremarkable on this noncontrast examination. Mediastinum/Nodes: No pathologic thoracic adenopathy. Lungs/Pleura: Small right and small to moderate left pleural effusions have developed with associated bibasilar compressive atelectasis with subtotal collapse of the left lower lobe. No focal pulmonary nodules within the aerated pulmonary parenchyma. No pneumothorax. Central airways are widely patent. Upper Abdomen: Hypoechoic mass within the right hepatic lobe in keeping with Jazlyne Gauger hepatic metastasis is better seen on prior abdominal CT examination of 03/31/2020, but appears grossly unchanged measuring roughly 23 mm 2028 mm on axial image # 121/2. Limited images of the upper abdomen are otherwise unremarkable. Musculoskeletal: No lytic or blastic bone lesions are seen. IMPRESSION: Interval development of bilateral pleural effusions, left slightly greater than right, with resultant bibasilar atelectasis including subtotal collapse of the left lower lobe. No evidence of intrathoracic metastatic disease, though evaluation of the collapsed parenchyma is limited. Hepatic metastasis again demonstrated. Electronically Signed   By: Fidela Salisbury MD   On: 04/13/2020 19:57        Scheduled Meds: . acetaminophen  1,000 mg Oral Q8H  . amoxicillin-clavulanate  1 tablet Oral Q12H  . Chlorhexidine Gluconate Cloth  6 each Topical Daily  . diclofenac Sodium  2 g Topical QID  . enoxaparin (LOVENOX) injection  40 mg Subcutaneous Q24H  . famotidine  20 mg Oral BID  . methocarbamol  1,000 mg Oral QID   Continuous Infusions: . lactated ringers 50  mL/hr at 04/14/20 0301     LOS: 14 days    Time spent: over 30 min    Fayrene Helper, MD [facility]   To contact the attending provider between 7A-7P or the covering provider during after hours 7P-7A, please log into the web site [email or web address] and access using universal [facility] password for that web site. If you do not have the password, please call the hospital operator.  04/14/2020, 1:13 PM

## 2020-04-15 LAB — COMPREHENSIVE METABOLIC PANEL
ALT: 13 U/L (ref 0–44)
AST: 17 U/L (ref 15–41)
Albumin: 2.7 g/dL — ABNORMAL LOW (ref 3.5–5.0)
Alkaline Phosphatase: 47 U/L (ref 38–126)
Anion gap: 10 (ref 5–15)
BUN: <5 mg/dL — ABNORMAL LOW (ref 6–20)
CO2: 30 mmol/L (ref 22–32)
Calcium: 8.6 mg/dL — ABNORMAL LOW (ref 8.9–10.3)
Chloride: 100 mmol/L (ref 98–111)
Creatinine, Ser: 0.45 mg/dL (ref 0.44–1.00)
GFR calc Af Amer: >60 mL/min (ref >60)
GFR calc non Af Amer: >60 mL/min (ref >60)
Glucose, Bld: 86 mg/dL (ref 70–99)
Potassium: 3.8 mmol/L (ref 3.5–5.1)
Sodium: 140 mmol/L (ref 135–145)
Total Bilirubin: 0.5 mg/dL (ref 0.3–1.2)
Total Protein: 6.1 g/dL — ABNORMAL LOW (ref 6.5–8.1)

## 2020-04-15 LAB — CBC WITH DIFFERENTIAL/PLATELET
Abs Immature Granulocytes: 0.06 10*3/uL (ref 0.00–0.07)
Basophils Absolute: 0.1 10*3/uL (ref 0.0–0.1)
Basophils Relative: 1 %
Eosinophils Absolute: 0.1 10*3/uL (ref 0.0–0.5)
Eosinophils Relative: 1 %
HCT: 30.5 % — ABNORMAL LOW (ref 36.0–46.0)
Hemoglobin: 9.6 g/dL — ABNORMAL LOW (ref 12.0–15.0)
Immature Granulocytes: 1 %
Lymphocytes Relative: 14 %
Lymphs Abs: 1.4 10*3/uL (ref 0.7–4.0)
MCH: 25.8 pg — ABNORMAL LOW (ref 26.0–34.0)
MCHC: 31.5 g/dL (ref 30.0–36.0)
MCV: 82 fL (ref 80.0–100.0)
Monocytes Absolute: 1.1 10*3/uL — ABNORMAL HIGH (ref 0.1–1.0)
Monocytes Relative: 10 %
Neutro Abs: 7.7 10*3/uL (ref 1.7–7.7)
Neutrophils Relative %: 73 %
Platelets: 412 10*3/uL — ABNORMAL HIGH (ref 150–400)
RBC: 3.72 MIL/uL — ABNORMAL LOW (ref 3.87–5.11)
RDW: 22.5 % — ABNORMAL HIGH (ref 11.5–15.5)
WBC: 10.4 10*3/uL (ref 4.0–10.5)
nRBC: 0 % (ref 0.0–0.2)

## 2020-04-15 LAB — MAGNESIUM: Magnesium: 1.9 mg/dL (ref 1.7–2.4)

## 2020-04-15 LAB — PHOSPHORUS: Phosphorus: 5.2 mg/dL — ABNORMAL HIGH (ref 2.5–4.6)

## 2020-04-15 MED ORDER — SENNOSIDES-DOCUSATE SODIUM 8.6-50 MG PO TABS
2.0000 | ORAL_TABLET | Freq: Every day | ORAL | Status: DC
  Administered 2020-04-15: 2 via ORAL
  Filled 2020-04-15: qty 2

## 2020-04-15 MED ORDER — ENOXAPARIN SODIUM 40 MG/0.4ML ~~LOC~~ SOLN: 40.0000 mg | SUBCUTANEOUS | Status: DC

## 2020-04-15 MED ORDER — POLYETHYLENE GLYCOL 3350 17 G PO PACK
17.0000 g | PACK | Freq: Two times a day (BID) | ORAL | Status: DC
  Administered 2020-04-15 (×2): 17 g via ORAL
  Filled 2020-04-15 (×2): qty 1

## 2020-04-15 NOTE — Progress Notes (Signed)
PROGRESS NOTE    Stephanie [name]  NAT:[id number] DOB: December 24, 1980 DOA: 03/31/2020 PCP: Patient, [name]   Chief Complaint  Patient presents with  . Abdominal Pain    Brief Narrative: Stephanie [name] Stephanie [name] 39 y.o.femalewith medical history significant ofrecent admission from June 14 to June 18 with abdominal pain and abscess with Stephanie [name] question of sigmoidcolon malignancy versus diverticulitis. At that time she had an abnormal CT scan which also showed Jonnette Nuon liver mass. She had Hanan Moen barely elevated CEA at 4.9. She was placed on IV antibiotics, an attempt was made at liver biopsy however ultrasound suggested it might be Evalin Shawhan hemangioma. An MRI was equivocal. She was sent home on Augmentin which she completed and was doing well with until the last few days. She has continued to have blood in her stool. Stools are soft and not fully formed. She is passing gas. She has had increasing nausea with emesis over the last 12 hours. She reports ongoing lower abdominal cramping that is worse in the left lower quadrant. Her pain was 10 out of 10 requiring IV Dilaudid in the ED. She has an IUD in place, no sexual partner for the last 1 year. She denies vaginal discharge, vaginal bleeding, dysuria, hematuria, chest pain, or shortness of breath. In the ED she was sent for CT of the abdomen and pelvis which showed enlarging sigmoid colonic mass, which appeared which appears to be associated with focal perforation and has fistulized into the left adnexal region involving the left TOA. There are multiple enlarged lymph nodes which are bigger than before measuring up to 1.2 cm. The previously suspected metastatic lesion in the liver is also enlarged, small volume ascites. Patient placed empirically on IV antibiotics.  GI and general surgery were consulted.  Patient for exploratory laparotomy and partial colectomy with urethral stent placement today 04/04/2020 per general surgery. Patient with some complaints of  shortness of breath the morning of 04/04/2020, concern for volume overload and as such chest x-ray obtained and patient given Rosali Augello dose of IV Lasix, IV fluids discontinued.  She was admitted for abdominal pain, nausea, and vomiting and had imaging findings concerning for colonic mass with metastatic disease with tubo-ovarian abscess and focally contained perforation.  She's now s/p surgery with colectomy and end colostomy.  Cultures growing staph epidermidis.  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  Previously suspected mestatic lesion in segment 7 of the liver has enlarged.   General surgery consulted - now s/p colectomy and end colostomy on 04/04/20 (findings at surgery concerning for sigmoid colon cancer with pelvic abscess)- continue antibiotics Biopsies c/w invasive moderately differentiated adenocarcinoma, 6 cm, involving rectosigmoid junction.  Carcinoma invades into serosal surface  with perforation and associated serositis.  Radial resection margin is positive for carcinoma; proximal and distal margins are not involved.  Lymphovascular invasion is present.   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  One tumor deposit.  (mesenteric LN, resection) Metastatic adenocarcinoma to 1 of 6 LN's, 1 tumor deposit. Oncology c/s - appreciate assistance - follow CT chest - bilateral pleural effusions L>R, no evidence of intrathoracic metastatic disease - will need liver biopsy, will plan for Monday 7/19 ID c/s - recommending augmentin to treat for 2 weeks of abx from 7/11 when ready for d/c (has been narrowed to augmentin per surgery)  Abscess with staph epidermidis - pan sensitive Per surgery, will need wound care and colostomy care at home - per CM, [facility] (charity home health agency) has declined patient - they are checking with other home health agencies to see who can accept pt under charity (if vac not possible, plan for BID wet to dry dressing changes). Soft diet per surgery Port placed on 7/15  3.  Shortness of breath Secondary to volume overload. CXR from 7/7 with LLL retrocardiac collapse/consolidation and small L effusion Improved with lasix Continue to follow I/O, daily weights   4.  Nausea/vomiting/abdominal pain.   Due to sigmoid colon cancer with pelvic abscess Continue analgesia/antiemetics prn     # Right Side Pain: suspect this is musculoskeletal, trial of voltaren - improved  5.  Acute blood loss anemia/chronic iron deficiency anemia Patient noted to have some scant bright red blood per rectum noted on previous exam.  Hemoglobin stable around 9  GI saw earlier in hospitalization prior to surgery and at that time planned for colonoscopy in 6-8 weeks -> follow up with oncology and GI outpatient    6.  Moderate protein calorie malnutrition Supplements ordered Continue soft diet  7.  Hypokalemia Improved, follow   8. Urinary retention Foley catheter discontinued per general surgery on 04/05/2020 however patient noted to have urinary retention and foley was replaced Doing well without foley  # Constipation: continue bowel  regimen  DVT prophylaxis: lovenox Code Status: full  Family Communication: none at bedside Disposition:   Status is: Inpatient  Remains inpatient appropriate because:Inpatient level of care appropriate due to severity of illness   Dispo: The patient is from: Home              Anticipated d/c is to: pending              Anticipated d/c date is: > 3 days              Patient currently is not medically stable to d/c.   Consultants:   Surgery  Urology  ID  Oncology  Gastroenterology  Procedures:  Open sigmoid colectomy and end colostomy 7/7 by general surgery Cystoscopy, retrograde pyelogram with interpretation, bilateral temporary ureteral stent placement 7/7 by urology  7/15 insertion of right internal jugular port-Eilidh Marcano-cath with ultrasound and fluoro guidance Antimicrobials: Anti-infectives (From admission, onward)   Start     Dose/Rate Route Frequency Ordered Stop   04/12/20 1039  ceFAZolin (ANCEF) 2-4 GM/100ML-% IVPB       Note to Pharmacy: Randa Evens  : cabinet override      04/12/20 1039 04/12/20 1130   04/12/20 0730  ceFAZolin (ANCEF) IVPB 2g/100 mL premix        2 g 200 mL/hr over 30 Minutes Intravenous On call to O.R. 04/12/20 0728 04/12/20 1119   04/10/20 1330  amoxicillin-clavulanate (AUGMENTIN) 875-125 MG per  tablet 1 tablet     Discontinue     1 tablet Oral Every 12 hours 04/10/20 1218 04/22/20 0959   04/08/20 1400  vancomycin (VANCOCIN) IVPB 1000 mg/200 mL premix  Status:  Discontinued        1,000 mg 200 mL/hr over 60 Minutes Intravenous Every 8 hours 04/08/20 1136 04/08/20 1322   04/08/20 0000  vancomycin (VANCOREADY) IVPB 750 mg/150 mL  Status:  Discontinued        750 mg 150 mL/hr over 60 Minutes Intravenous Every 8 hours 04/07/20 1628 04/08/20 1136   04/07/20 1645  vancomycin (VANCOREADY) IVPB 1500 mg/300 mL        1,500 mg 150 mL/hr over 120 Minutes Intravenous STAT 04/07/20 1638 04/07/20 1847   04/07/20 1630  vancomycin (VANCOREADY) IVPB  1500 mg/300 mL  Status:  Discontinued        1,500 mg 150 mL/hr over 120 Minutes Intravenous  Once 04/07/20 1627 04/07/20 1628   04/04/20 1045  cefoTEtan (CEFOTAN) 2 g in sodium chloride 0.9 % 100 mL IVPB        2 g 200 mL/hr over 30 Minutes Intravenous On call to O.R. 04/03/20 1639 04/04/20 1213   03/31/20 2000  ceFEPIme (MAXIPIME) 2 g in sodium chloride 0.9 % 100 mL IVPB  Status:  Discontinued        2 g 200 mL/hr over 30 Minutes Intravenous Every 8 hours 03/31/20 1408 04/10/20 0917   03/31/20 2000  metroNIDAZOLE (FLAGYL) IVPB 500 mg  Status:  Discontinued        500 mg 100 mL/hr over 60 Minutes Intravenous Every 8 hours 03/31/20 1410 04/10/20 0917   03/31/20 1600  ceFEPIme (MAXIPIME) 2 g in sodium chloride 0.9 % 100 mL IVPB  Status:  Discontinued        2 g 200 mL/hr over 30 Minutes Intravenous  Once 03/31/20 1549 03/31/20 1551   03/31/20 1200  piperacillin-tazobactam (ZOSYN) IVPB 3.375 g        3.375 g 100 mL/hr over 30 Minutes Intravenous  Once 03/31/20 1159 03/31/20 1341     Subjective: No new complaints  Objective: Vitals:   04/14/20 0648 04/14/20 1359 04/14/20 2128 04/15/20 0553  BP: 124/81 111/74 114/77 119/81  Pulse: 63 62 (!) 56 65  Resp: 18 18 14 14   Temp: (!) 97.5 F (36.4 C) 98 F (36.7 C) 98.1 F (36.7 C) 97.9 F (36.6 C)  TempSrc:  Oral Oral Oral  SpO2: 99% 98% 97% 97%  Weight:      Height:        Intake/Output Summary (Last 24 hours) at 04/15/2020 1238 Last data filed at 04/15/2020 0855 Gross per 24 hour  Intake 602.57 ml  Output 1850 ml  Net -1247.43 ml   Filed Weights   04/08/20 1132 04/09/20 0642 04/13/20 0720  Weight: 82.9 kg 83.1 kg 73.1 kg    Examination:  General: No acute distress. Cardiovascular: Heart sounds show Dmitry Macomber regular rate, and rhythm Lungs: Clear to auscultation bilaterally  Abdomen: dressing in place, ostomy Neurological: Alert and oriented 3. Moves all extremities 4. Cranial nerves II through XII grossly intact. Skin: Warm  and dry. No rashes or lesions. Extremities: No clubbing or cyanosis. No edema.    Data Reviewed: I have personally reviewed following labs and imaging studies  CBC: Recent Labs  Lab 04/11/20 0513 04/12/20 0438 04/13/20 0440 04/14/20 0519 04/15/20 0605  WBC 10.5 9.8 9.9 7.2 10.4  NEUTROABS 8.6* 7.7 8.1* 4.5 7.7  HGB  9.7* 9.8* 10.4* 9.1* 9.6*  HCT 31.0* 31.6* 33.7* 29.6* 30.5*  MCV 80.7 81.7 81.8 82.9 82.0  PLT 290 366 440* 432* 412*    Basic Metabolic Panel: Recent Labs  Lab 04/10/20 0506 04/10/20 0506 04/11/20 0513 04/12/20 0438 04/13/20 0440 04/14/20 0519 04/15/20 0605  NA 138   < > 136 137 139 138 140  K 3.8   < > 3.5 3.4* 4.3 3.3* 3.8  CL 104   < > 103 101 100 101 100  CO2 23   < > 22 24 25 28 30   GLUCOSE 83   < > 85 91 121* 89 86  BUN <5*   < > <5* <5* <5* 6 <5*  CREATININE 0.48   < > 0.37* 0.41* 0.36* 0.32* 0.45  CALCIUM 7.8*   < > 7.9* 8.0* 8.9 8.3* 8.6*  MG 2.0  --   --  1.7 2.0 1.7 1.9  PHOS  --   --   --  3.8 3.7 4.4 5.2*   < > = values in this interval not displayed.    GFR: Estimated Creatinine Clearance: 98.7 mL/min (by C-G formula based on SCr of 0.45 mg/dL).  Liver Function Tests: Recent Labs  Lab 04/12/20 0438 04/13/20 0440 04/14/20 0519 04/15/20 0605  AST 26 19 21 17   ALT 14 13 14 13   ALKPHOS 48 52 43 47  BILITOT 0.7 0.4 0.5 0.5  PROT 5.7* 6.2* 5.7* 6.1*  ALBUMIN 2.3* 2.4* 2.3* 2.7*    CBG: No results for input(s): GLUCAP in the last 168 hours.   No results found for this or any previous visit (from the past 240 hour(s)).       Radiology Studies: CT CHEST WO CONTRAST  Result Date: 04/13/2020 CLINICAL DATA:  Colorectal cancer, staging examination, abdominal pain EXAM: CT CHEST WITHOUT CONTRAST TECHNIQUE: Multidetector CT imaging of the chest was performed following the standard protocol without IV contrast. COMPARISON:  None. FINDINGS: Cardiovascular: Right internal jugular chest port tip is seen within the right atrium.  Cardiac size within normal limits. No pericardial effusion. No significant coronary artery calcification. Central pulmonary arteries are of normal caliber. Thoracic aorta is unremarkable on this noncontrast examination. Mediastinum/Nodes: No pathologic thoracic adenopathy. Lungs/Pleura: Small right and small to moderate left pleural effusions have developed with associated bibasilar compressive atelectasis with subtotal collapse of the left lower lobe. No focal pulmonary nodules within the aerated pulmonary parenchyma. No pneumothorax. Central airways are widely patent. Upper Abdomen: Hypoechoic mass within the right hepatic lobe in keeping with Pinchos Topel hepatic metastasis is better seen on prior abdominal CT examination of 03/31/2020, but appears grossly unchanged measuring roughly 23 mm 2028 mm on axial image # 121/2. Limited images of the upper abdomen are otherwise unremarkable. Musculoskeletal: No lytic or blastic bone lesions are seen. IMPRESSION: Interval development of bilateral pleural effusions, left slightly greater than right, with resultant bibasilar atelectasis including subtotal collapse of the left lower lobe. No evidence of intrathoracic metastatic disease, though evaluation of the collapsed parenchyma is limited. Hepatic metastasis again demonstrated. Electronically Signed   By: Fidela Salisbury MD   On: 04/13/2020 19:57        Scheduled Meds: . acetaminophen  1,000 mg Oral Q8H  . amoxicillin-clavulanate  1 tablet Oral Q12H  . Chlorhexidine Gluconate Cloth  6 each Topical Daily  . diclofenac Sodium  2 g Topical QID  . famotidine  20 mg Oral BID  . methocarbamol  1,000 mg Oral QID  . polyethylene glycol  17 g Oral BID  . senna-docusate  2 tablet Oral QHS   Continuous Infusions: . lactated ringers 50 mL/hr at 04/14/20 0301     LOS: 15 days    Time spent: over 30 min    Fayrene Helper, MD [facility]   To contact the attending provider between 7A-7P or the covering  provider during after hours 7P-7A, please log into the web site [email or web address] and access using universal [place] password for that web site. If you do not have the password, please call the hospital operator.  04/15/2020, 12:38 PM

## 2020-04-15 NOTE — Consult Note (Signed)
Chief Complaint: Patient was seen in consultation today for liver lesion biopsy Chief Complaint  Patient presents with  . Abdominal Pain   at the request of Dr Ky Barban   Supervising Physician: Corrie Mckusick  Patient Status: [facility] - In-pt  History of Present Illness: Stephanie [name] is a 39 y.o. 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 -the liver lesion increased in size over the last month, which is highly suspicious for liver metastasis from colon cancer.  I recommend liver biopsy.  I spoke with Dr. Vernard Gambles today, biopsy is not able to be done today due to her Lovenox injection this morning.  Now scheduled for biopsy - still inpt-- planned 7/19   Past Medical History:  Diagnosis Date  . BV (bacterial vaginosis)   . Lactose intolerance 03/12/2020  . UTI (lower urinary tract infection)     Past Surgical History:  Procedure Laterality Date  . CESAREAN SECTION    . CYSTOSCOPY WITH STENT PLACEMENT  04/04/2020   Procedure: CYSTOSCOPY WITH STENT PLACEMENT;  Surgeon: [name], MD;  Location: [facility] ORS;  Service: General;;  . LAPAROTOMY N/A 04/04/2020   Procedure: EXPLORATORY LAPAROTOMY;  Surgeon: [name], MD;  Location: [facility];  Service: General;  Laterality: N/A;  . PORTACATH PLACEMENT Right 04/12/2020   Procedure: INSERTION PORT-A-CATH WITH ULTRASOUND;  Surgeon: Kieth Brightly Arta Bruce, MD;  Location: [facility];  Service: General;  Laterality: Right;    Allergies: Patient has no known allergies.  Medications: Prior to Admission medications   Medication Sig Start Date End Date Taking? Authorizing Provider  ferrous sulfate 325 (65 FE) MG tablet Take 1 tablet (325 mg total) by mouth 2 (two) times daily with  a meal. 03/16/20  Yes Amin, Jeanella Flattery, MD  PARAGARD INTRAUTERINE COPPER IU 1 Device by Intrauterine route once.    Yes [provider]  polyethylene glycol (MIRALAX / GLYCOLAX) 17 g packet Take 17 g by mouth daily. 03/17/20  Yes Amin, Ankit Chirag, MD  senna-docusate (SENOKOT-S) 8.6-50 MG tablet Take 2 tablets by mouth 2 (two) times daily. 03/16/20  Yes Amin, Ankit Chirag, MD  ondansetron (ZOFRAN ODT) 4 MG disintegrating tablet Take 1 tablet (4 mg total) by mouth every 8 (eight) hours as needed for nausea or vomiting. 03/16/20   Damita Lack, MD     Family History  Problem Relation Age of Onset  . Heart disease Father   . Colon cancer Neg Hx   . Esophageal cancer Neg Hx     Social History   Socioeconomic History  . Marital status: Single    Spouse name: Not on file  . Number of children: Not on file  . Years of education: Not on file  . Highest education level: Not on file  Occupational History  . Not on file  Tobacco Use  . Smoking status: Former Smoker    Packs/day: 0.50    Years: 10.00    Pack years: 5.00    Types: Cigarettes  . Smokeless tobacco: Never Used  Vaping Use  . Vaping Use: Never used  Substance and Sexual Activity  . Alcohol use: Yes    Comment: seldom  . Drug use: No  . Sexual activity: Yes  Birth control/protection: None, Other-see comments    Comment: nuva ring  Other Topics Concern  . Not on file  Social History Narrative  . Not on file   Social Determinants of Health   Financial Resource Strain:   . Difficulty of Paying Living Expenses:   Food Insecurity:   . Worried About Charity fundraiser in the Last Year:   . Arboriculturist in the Last Year:   Transportation Needs:   . Film/video editor (Medical):   [name] Lack of Transportation (Non-Medical):   Physical Activity:   . Days of Exercise per Week:   . Minutes of Exercise per Session:   Stress:   . Feeling of Stress :   Social Connections:   . Frequency of  Communication with Friends and Family:   . Frequency of Social Gatherings with Friends and Family:   . Attends Religious Services:   . Active Member of Clubs or Organizations:   . Attends Archivist Meetings:   [name] Marital Status:    Review of Systems: A 12 point ROS discussed and pertinent positives are indicated in the HPI above.  All other systems are negative.  Review of Systems  Constitutional: Positive for appetite change, fatigue and unexpected weight change.  Respiratory: Negative for cough and shortness of breath.   Cardiovascular: Negative for chest pain.  Gastrointestinal: Positive for abdominal pain.  Neurological: Positive for weakness.  Psychiatric/Behavioral: Negative for behavioral problems and confusion.    Vital Signs: BP 119/81 (BP Location: Right Arm)   Pulse 65   Temp 97.9 F (36.6 C) (Oral)   Resp 14   Ht 5\' 9"  (1.753 m)   Wt 161 lb 3.2 oz (73.1 kg)   SpO2 97%   BMI 23.81 kg/m   Physical Exam Vitals reviewed.  Cardiovascular:     Rate and Rhythm: Normal rate and regular rhythm.  Pulmonary:     Effort: Pulmonary effort is normal.     Breath sounds: Normal breath sounds.  Abdominal:     Tenderness: There is abdominal tenderness.  Skin:    General: Skin is warm.  Neurological:     Mental Status: She is alert and oriented to person, place, and time.  Psychiatric:        Behavior: Behavior normal.     Imaging: CT CHEST WO CONTRAST  Result Date: 04/13/2020 CLINICAL DATA:  Colorectal cancer, staging examination, abdominal pain EXAM: CT CHEST WITHOUT CONTRAST TECHNIQUE: Multidetector CT imaging of the chest was performed following the standard protocol without IV contrast. COMPARISON:  None. FINDINGS: Cardiovascular: Right internal jugular chest port tip is seen within the right atrium. Cardiac size within normal limits. No pericardial effusion. No significant coronary artery calcification. Central pulmonary arteries are of normal caliber.  Thoracic aorta is unremarkable on this noncontrast examination. Mediastinum/Nodes: No pathologic thoracic adenopathy. Lungs/Pleura: Small right and small to moderate left pleural effusions have developed with associated bibasilar compressive atelectasis with subtotal collapse of the left lower lobe. No focal pulmonary nodules within the aerated pulmonary parenchyma. No pneumothorax. Central airways are widely patent. Upper Abdomen: Hypoechoic mass within the right hepatic lobe in keeping with a hepatic metastasis is better seen on prior abdominal CT examination of 03/31/2020, but appears grossly unchanged measuring roughly 23 mm 2028 mm on axial image # 121/2. Limited images of the upper abdomen are otherwise unremarkable. Musculoskeletal: No lytic or blastic bone lesions are seen. IMPRESSION: Interval development of bilateral pleural effusions, left slightly greater than  right, with resultant bibasilar atelectasis including subtotal collapse of the left lower lobe. No evidence of intrathoracic metastatic disease, though evaluation of the collapsed parenchyma is limited. Hepatic metastasis again demonstrated. Electronically Signed   By: Fidela Salisbury MD   On: 04/13/2020 19:57   CT ABDOMEN PELVIS W CONTRAST  Result Date: 03/31/2020 CLINICAL DATA:  39 year old female with history of left lower quadrant abdominal pain. EXAM: CT ABDOMEN AND PELVIS WITH CONTRAST TECHNIQUE: Multidetector CT imaging of the abdomen and pelvis was performed using the standard protocol following bolus administration of intravenous contrast. CONTRAST:  180mL OMNIPAQUE IOHEXOL 300 MG/ML  SOLN COMPARISON:  CT the abdomen and pelvis 03/12/2020. FINDINGS: Lower chest: Unremarkable. 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 No other new hepatic lesions. No intra or  extrahepatic biliary ductal dilatation. Gallbladder is normal in appearance. Pancreas: No pancreatic mass. No pancreatic ductal dilatation. No peripancreatic fluid collections or inflammatory changes. Spleen: Unremarkable. Adrenals/Urinary Tract: Bilateral kidneys and bilateral adrenal glands are unremarkable in appearance. No hydroureteronephrosis. Urinary bladder is normal in appearance. Stomach/Bowel: Normal appearance of the stomach. No pathologic dilatation of small bowel or colon. Previous the noted lesion in the mid sigmoid colon appears larger than the prior examination estimated to measure approximately 6.7 x 3.2 x 4.4 cm (coronal image 48 of series 4 and sagittal image 78 of series 5). Adjacent to this there is a very complex collection of gas and fluid which is now intimately associated with the left adnexa measuring approximately 6.5 x 6.2 x 6.6 cm (axial image 75 of series 2 and coronal image 52 of series 4). This collection has fluid of heterogeneous attenuation and multiple internal locules of gas, as well as multiple areas of internal enhancement. This is associated with a serpiginous structure with peripheral enhancement in the left adnexal region, likely to represent dilated fallopian tube which also contains gas and fluid. Notably, this collection appears to communicate with the previously described colonic mass across the midline best appreciated on axial image 70 of series 2. Normal appendix. Vascular/Lymphatic: No significant atherosclerotic disease, aneurysm or dissection noted in the abdominal or pelvic vasculature. Multiple enlarged lymph nodes in the pelvis measuring up to 1.2 cm in short axis adjacent to the inferior mesenteric artery (axial image 61 of series 2). Borderline enlarged retroperitoneal lymph nodes are also noted measuring up to 8 mm in short axis. Reproductive: IUD present in the uterus. Uterus and right ovary are otherwise unremarkable in appearance. Large complex gas and  fluid collection intimately associated with the left adnexa (discussed above). Tubular serpiginous area with rim enhancement also noted in the left adnexal region. Other: Small volume of ascites.  No pneumoperitoneum. Musculoskeletal: There are no aggressive appearing lytic or blastic lesions noted in the visualized portions of the skeleton. IMPRESSION: 1. Previously noted sigmoid colon mass appears increased in size, and again appears to be associated with a focal contained perforation which crosses the midline and has fistulized into the left adnexal region where there is now what appears to be a large left tubo-ovarian abscess, as detailed above. This is also associated with multiple enlarged lymph nodes in the pelvis measuring up to 1.2 cm in short axis and borderline enlarged retroperitoneal lymph nodes, concerning for metastatic disease. In addition, previously suspected metastatic lesion in segment 7 of the liver has enlarged.  2. Small volume of ascites. 3. Additional incidental findings, as above. These results were called by telephone at the time of interpretation on 03/31/2020 at 11:58 am to provider [facility], who verbally acknowledged these results. Electronically Signed   By: Vinnie Langton M.D.   On: 03/31/2020 12:00   DG CHEST PORT 1 VIEW  Result Date: 04/04/2020 CLINICAL DATA:  Preoperative evaluation for exploratory laparotomy EXAM: PORTABLE CHEST 1 VIEW COMPARISON:  03/31/2020 abdomen CT, 10/19/2012 chest x-ray FINDINGS: Lower lung volumes. Left lower lobe collapse/consolidation noted with an associated small left effusion. Difficult to exclude left lower lung pneumonia. Normal heart size and vascularity. Trachea midline. No acute osseous finding. IMPRESSION: Left lower lobe retrocardiac collapse/consolidation and small left effusion. Low lung volumes. Electronically Signed   By: Jerilynn Mages.  Shick M.D.   On: 04/04/2020 10:27   DG Abd Portable 1V  Result Date: 04/11/2020 CLINICAL DATA:  Abdominal  pain, vomiting EXAM: PORTABLE ABDOMEN - 1 VIEW COMPARISON:  12/26/2016.  CT 03/31/2020 FINDINGS: Nonobstructive bowel gas pattern. Left lower quadrant ostomy noted. IUD present in the pelvis. No free air or organomegaly. No suspicious calcification. IMPRESSION: Left lower quadrant ostomy noted. No evidence of bowel obstruction. No acute findings. Electronically Signed   By: Rolm Baptise M.D.   On: 04/11/2020 11:05   DG C-Arm 1-60 Min-No Report  Result Date: 04/12/2020 Fluoroscopy was utilized by the requesting physician.  No radiographic interpretation.   DG C-Arm 1-60 Min-No Report  Result Date: 04/04/2020 Fluoroscopy was utilized by the requesting physician.  No radiographic interpretation.    Labs:  CBC: Recent Labs    04/12/20 0438 04/13/20 0440 04/14/20 0519 04/15/20 0605  WBC 9.8 9.9 7.2 10.4  HGB 9.8* 10.4* 9.1* 9.6*  HCT 31.6* 33.7* 29.6* 30.5*  PLT 366 440* 432* 412*    COAGS: Recent Labs    03/12/20 0615 03/31/20 1604 04/04/20 0445  INR 1.1 1.3* 1.4*  APTT 28 31 39*    BMP: Recent Labs    04/12/20 0438 04/13/20 0440 04/14/20 0519 04/15/20 0605  NA 137 139 138 140  K 3.4* 4.3 3.3* 3.8  CL 101 100 101 100  CO2 24 25 28 30   GLUCOSE 91 121* 89 86  BUN <5* <5* 6 <5*  CALCIUM 8.0* 8.9 8.3* 8.6*  CREATININE 0.41* 0.36* 0.32* 0.45  GFRNONAA >60 >60 >60 >60  GFRAA >60 >60 >60 >60    LIVER FUNCTION TESTS: Recent Labs    04/12/20 0438 04/13/20 0440 04/14/20 0519 04/15/20 0605  BILITOT 0.7 0.4 0.5 0.5  AST 26 19 21 17   ALT 14 13 14 13   ALKPHOS 48 52 43 47  PROT 5.7* 6.2* 5.7* 6.1*  ALBUMIN 2.3* 2.4* 2.3* 2.7*    TUMOR MARKERS: No results for input(s): AFPTM, CEA, CA199, CHROMGRNA in the last 8760 hours.  Assessment and Plan:  Newly dx colon cancer Liver lesion Scheduled for liner lesion biopsy in Rad tomorrow Risks and benefits of liver lesion bx was discussed with the patient and/or patient's family including, but not limited to bleeding,  infection, damage to adjacent structures or low yield requiring additional tests.  All of the questions were answered and there is agreement to proceed. Consent signed and in chart.   Thank you for this interesting consult.  I greatly enjoyed meeting Stephanie [name] and look forward to participating in their care.  A copy of this report was sent to the requesting provider on this date.  Electronically Signed: Lavonia Drafts, PA-C  04/15/2020, 12:04 PM   I spent a total of 20 Minutes    in face to face in clinical consultation, greater than 50% of which was counseling/coordinating care for liver lesion bx

## 2020-04-15 NOTE — Progress Notes (Signed)
3 Days Post-Op   Subjective/Chief Complaint: Not having ostomy output  Feels full   Objective: Vital signs in last 24 hours: Temp:  [97.9 F (36.6 C)-98.1 F (36.7 C)] 97.9 F (36.6 C) (07/18 0553) Pulse Rate:  [56-65] 65 (07/18 0553) Resp:  [14-18] 14 (07/18 0553) BP: (111-119)/(74-81) 119/81 (07/18 0553) SpO2:  [97 %-98 %] 97 % (07/18 0553) Last BM Date: 04/13/20  Intake/Output from previous day: 07/17 0701 - 07/18 0700 In: 950.4 [P.O.:240; I.V.:710.4] Out: 1900 [Urine:1900] Intake/Output this shift: Total I/O In: -  Out: 250 [Urine:250]  Incision/Wound:CDI vac in place  Ostomy pink bag empty   Lab Results:  Recent Labs    04/14/20 0519 04/15/20 0605  WBC 7.2 10.4  HGB 9.1* 9.6*  HCT 29.6* 30.5*  PLT 432* 412*   BMET Recent Labs    04/14/20 0519 04/15/20 0605  NA 138 140  K 3.3* 3.8  CL 101 100  CO2 28 30  GLUCOSE 89 86  BUN 6 <5*  CREATININE 0.32* 0.45  CALCIUM 8.3* 8.6*   PT/INR No results for input(s): LABPROT, INR in the last 72 hours. ABG No results for input(s): PHART, HCO3 in the last 72 hours.  Invalid input(s): PCO2, PO2  Studies/Results: CT CHEST WO CONTRAST  Result Date: 04/13/2020 CLINICAL DATA:  Colorectal cancer, staging examination, abdominal pain EXAM: CT CHEST WITHOUT CONTRAST TECHNIQUE: Multidetector CT imaging of the chest was performed following the standard protocol without IV contrast. COMPARISON:  None. FINDINGS: Cardiovascular: Right internal jugular chest port tip is seen within the right atrium. Cardiac size within normal limits. No pericardial effusion. No significant coronary artery calcification. Central pulmonary arteries are of normal caliber. Thoracic aorta is unremarkable on this noncontrast examination. Mediastinum/Nodes: No pathologic thoracic adenopathy. Lungs/Pleura: Small right and small to moderate left pleural effusions have developed with associated bibasilar compressive atelectasis with subtotal collapse of  the left lower lobe. No focal pulmonary nodules within the aerated pulmonary parenchyma. No pneumothorax. Central airways are widely patent. Upper Abdomen: Hypoechoic mass within the right hepatic lobe in keeping with a hepatic metastasis is better seen on prior abdominal CT examination of 03/31/2020, but appears grossly unchanged measuring roughly 23 mm 2028 mm on axial image # 121/2. Limited images of the upper abdomen are otherwise unremarkable. Musculoskeletal: No lytic or blastic bone lesions are seen. IMPRESSION: Interval development of bilateral pleural effusions, left slightly greater than right, with resultant bibasilar atelectasis including subtotal collapse of the left lower lobe. No evidence of intrathoracic metastatic disease, though evaluation of the collapsed parenchyma is limited. Hepatic metastasis again demonstrated. Electronically Signed   By: Fidela Salisbury MD   On: 04/13/2020 19:57    Anti-infectives: Anti-infectives (From admission, onward)   Start     Dose/Rate Route Frequency Ordered Stop   04/12/20 1039  ceFAZolin (ANCEF) 2-4 GM/100ML-% IVPB       Note to Pharmacy: Randa Evens  : cabinet override      04/12/20 1039 04/12/20 1130   04/12/20 0730  ceFAZolin (ANCEF) IVPB 2g/100 mL premix        2 g 200 mL/hr over 30 Minutes Intravenous On call to O.R. 04/12/20 0728 04/12/20 1119   04/10/20 1330  amoxicillin-clavulanate (AUGMENTIN) 875-125 MG per tablet 1 tablet     Discontinue     1 tablet Oral Every 12 hours 04/10/20 1218 04/22/20 0959   04/08/20 1400  vancomycin (VANCOCIN) IVPB 1000 mg/200 mL premix  Status:  Discontinued  1,000 mg 200 mL/hr over 60 Minutes Intravenous Every 8 hours 04/08/20 1136 04/08/20 1322   04/08/20 0000  vancomycin (VANCOREADY) IVPB 750 mg/150 mL  Status:  Discontinued        750 mg 150 mL/hr over 60 Minutes Intravenous Every 8 hours 04/07/20 1628 04/08/20 1136   04/07/20 1645  vancomycin (VANCOREADY) IVPB 1500 mg/300 mL        1,500  mg 150 mL/hr over 120 Minutes Intravenous STAT 04/07/20 1638 04/07/20 1847   04/07/20 1630  vancomycin (VANCOREADY) IVPB 1500 mg/300 mL  Status:  Discontinued        1,500 mg 150 mL/hr over 120 Minutes Intravenous  Once 04/07/20 1627 04/07/20 1628   04/04/20 1045  cefoTEtan (CEFOTAN) 2 g in sodium chloride 0.9 % 100 mL IVPB        2 g 200 mL/hr over 30 Minutes Intravenous On call to O.R. 04/03/20 1639 04/04/20 1213   03/31/20 2000  ceFEPIme (MAXIPIME) 2 g in sodium chloride 0.9 % 100 mL IVPB  Status:  Discontinued        2 g 200 mL/hr over 30 Minutes Intravenous Every 8 hours 03/31/20 1408 04/10/20 0917   03/31/20 2000  metroNIDAZOLE (FLAGYL) IVPB 500 mg  Status:  Discontinued        500 mg 100 mL/hr over 60 Minutes Intravenous Every 8 hours 03/31/20 1410 04/10/20 0917   03/31/20 1600  ceFEPIme (MAXIPIME) 2 g in sodium chloride 0.9 % 100 mL IVPB  Status:  Discontinued        2 g 200 mL/hr over 30 Minutes Intravenous  Once 03/31/20 1549 03/31/20 1551   03/31/20 1200  piperacillin-tazobactam (ZOSYN) IVPB 3.375 g        3.375 g 100 mL/hr over 30 Minutes Intravenous  Once 03/31/20 1159 03/31/20 1341      Assessment/Plan: POD#11 - status post ex lap with Hartmann's resection for sigmoid perforation - 04/04/2020 - Dr. Barry Dienes POD#3 - status post port placement - 04/12/2020 - Dr. Eulis Manly dressing in place - TOC/SW arranging [facility] for discharge             Colostomy care             Perc liver biopsy to be scheduled - possibly Monday  Patient stable for discharge home from surgical standpoint.  Will need wound care and colostomy care - HHN to be arranged.  If VAC not possible, can switch to BID wet to dry dressing changes.  Miralax for bowel regimen.   LOS: 15 days    Turner Daniels MD  04/15/2020

## 2020-04-16 ENCOUNTER — Inpatient Hospital Stay (HOSPITAL_COMMUNITY): Payer: Medicaid Other

## 2020-04-16 ENCOUNTER — Encounter: Payer: Self-pay | Admitting: Hematology

## 2020-04-16 LAB — COMPREHENSIVE METABOLIC PANEL
ALT: 14 U/L (ref 0–44)
AST: 16 U/L (ref 15–41)
Albumin: 2.7 g/dL — ABNORMAL LOW (ref 3.5–5.0)
Alkaline Phosphatase: 51 U/L (ref 38–126)
Anion gap: 10 (ref 5–15)
BUN: 5 mg/dL — ABNORMAL LOW (ref 6–20)
CO2: 28 mmol/L (ref 22–32)
Calcium: 8.8 mg/dL — ABNORMAL LOW (ref 8.9–10.3)
Chloride: 99 mmol/L (ref 98–111)
Creatinine, Ser: 0.48 mg/dL (ref 0.44–1.00)
GFR calc Af Amer: >60 mL/min (ref >60)
GFR calc non Af Amer: >60 mL/min (ref >60)
Glucose, Bld: 90 mg/dL (ref 70–99)
Potassium: 3.7 mmol/L (ref 3.5–5.1)
Sodium: 137 mmol/L (ref 135–145)
Total Bilirubin: 0.5 mg/dL (ref 0.3–1.2)
Total Protein: 6.4 g/dL — ABNORMAL LOW (ref 6.5–8.1)

## 2020-04-16 LAB — CBC WITH DIFFERENTIAL/PLATELET
Abs Immature Granulocytes: 0.06 10*3/uL (ref 0.00–0.07)
Basophils Absolute: 0.1 10*3/uL (ref 0.0–0.1)
Basophils Relative: 1 %
Eosinophils Absolute: 0.1 10*3/uL (ref 0.0–0.5)
Eosinophils Relative: 1 %
HCT: 32.8 % — ABNORMAL LOW (ref 36.0–46.0)
Hemoglobin: 10.2 g/dL — ABNORMAL LOW (ref 12.0–15.0)
Immature Granulocytes: 1 %
Lymphocytes Relative: 9 %
Lymphs Abs: 1.1 10*3/uL (ref 0.7–4.0)
MCH: 25.6 pg — ABNORMAL LOW (ref 26.0–34.0)
MCHC: 31.1 g/dL (ref 30.0–36.0)
MCV: 82.4 fL (ref 80.0–100.0)
Monocytes Absolute: 0.9 10*3/uL (ref 0.1–1.0)
Monocytes Relative: 7 %
Neutro Abs: 10.1 10*3/uL — ABNORMAL HIGH (ref 1.7–7.7)
Neutrophils Relative %: 81 %
Platelets: 422 10*3/uL — ABNORMAL HIGH (ref 150–400)
RBC: 3.98 MIL/uL (ref 3.87–5.11)
RDW: 22.5 % — ABNORMAL HIGH (ref 11.5–15.5)
WBC: 12.2 10*3/uL — ABNORMAL HIGH (ref 4.0–10.5)
nRBC: 0 % (ref 0.0–0.2)

## 2020-04-16 LAB — PROTIME-INR
INR: 1.1 (ref 0.8–1.2)
Prothrombin Time: 13.6 seconds (ref 11.4–15.2)

## 2020-04-16 LAB — PHOSPHORUS: Phosphorus: 5 mg/dL — ABNORMAL HIGH (ref 2.5–4.6)

## 2020-04-16 LAB — MAGNESIUM: Magnesium: 2 mg/dL (ref 1.7–2.4)

## 2020-04-16 MED ORDER — OXYCODONE HCL 5 MG PO TABS: 5.0000 mg | ORAL_TABLET | ORAL | 0 refills | Status: AC | PRN

## 2020-04-16 MED ORDER — LIDOCAINE HCL (PF) 1 % IJ SOLN
INTRAMUSCULAR | Status: AC | PRN
  Administered 2020-04-16: 10 mL

## 2020-04-16 MED ORDER — MIDAZOLAM HCL 2 MG/2ML IJ SOLN
INTRAMUSCULAR | Status: AC
  Filled 2020-04-16: qty 4

## 2020-04-16 MED ORDER — POLYETHYLENE GLYCOL 3350 17 G PO PACK: 17.0000 g | PACK | Freq: Two times a day (BID) | ORAL | 0 refills | Status: DC

## 2020-04-16 MED ORDER — LIDOCAINE HCL 1 % IJ SOLN
INTRAMUSCULAR | Status: AC
  Filled 2020-04-16: qty 20

## 2020-04-16 MED ORDER — DICLOFENAC SODIUM 1 % EX GEL: 2.0000 g | Freq: Four times a day (QID) | CUTANEOUS | 0 refills | Status: DC

## 2020-04-16 MED ORDER — FENTANYL CITRATE (PF) 100 MCG/2ML IJ SOLN
INTRAMUSCULAR | Status: AC
  Filled 2020-04-16: qty 2

## 2020-04-16 MED ORDER — FAMOTIDINE 20 MG PO TABS: 20.0000 mg | ORAL_TABLET | Freq: Two times a day (BID) | ORAL | 0 refills | Status: DC

## 2020-04-16 MED ORDER — FENTANYL CITRATE (PF) 100 MCG/2ML IJ SOLN
INTRAMUSCULAR | Status: AC | PRN
  Administered 2020-04-16 (×2): 50 ug via INTRAVENOUS

## 2020-04-16 MED ORDER — MIDAZOLAM HCL 2 MG/2ML IJ SOLN
INTRAMUSCULAR | Status: AC | PRN
  Administered 2020-04-16 (×3): 1 mg via INTRAVENOUS

## 2020-04-16 MED ORDER — AMOXICILLIN-POT CLAVULANATE 875-125 MG PO TABS: 1.0000 | ORAL_TABLET | Freq: Two times a day (BID) | ORAL | 0 refills | Status: AC

## 2020-04-16 MED ORDER — METHOCARBAMOL 500 MG PO TABS: 1000.0000 mg | ORAL_TABLET | Freq: Four times a day (QID) | ORAL | 0 refills | Status: AC | PRN

## 2020-04-16 NOTE — TOC Transition Note (Signed)
Transition of Care [facility]) - CM/SW Discharge Note   Patient Details  Name: Stephanie [name] MRN: [id number] Date of Birth: [birth year]  [facility] CM/SW Contact:  Lennart Pall, LCSW Phone Number: 04/16/2020, 4:20 PM   Clinical Narrative:   Alerted by RN that pt may dc today following WOC and MD visits.  Have been able to secure [facility] visits via [facility].  Pt reports that she is feeling fairly comfortable with dressing changes and ostomy care and ready for d/c.    Final next level of care: [place] Barriers to Discharge: Barriers Resolved   Patient Goals and CMS Choice Patient states their goals for this hospitalization and ongoing recovery are:: to get insurance and treatment CMS Medicare.gov Compare Post Acute Care list provided to:: Patient Choice offered to / list presented to : Patient  Discharge Placement                       Discharge Plan and Services In-house Referral: Clinical Social Work              DME Arranged: Ostomy supplies ([place] RN enrolled pt in [place] program)         HH Arranged: RN [place] Agency: [place] Date [facility] Agency Contacted: 04/16/20 Time [place]: 1500    Social Determinants of Health (SDOH) Interventions     Readmission Risk Interventions No flowsheet data found.

## 2020-04-16 NOTE — Discharge Instructions (Addendum)
[facility] - DISCHARGE INSTRUCTIONS TO PATIENT  Wound Care:   Change your dressing on the abdominal wound at least twice a day.  You may shower.  Diet:  As tolerated  Follow up appointment:  Call Dr. Marlowe Aschoff office [facility]) at [phone] for an appointment in 2 to 3 weeks.[name]

## 2020-04-16 NOTE — Discharge Summary (Signed)
Physician Discharge Summary  Stephanie [name] FHL:[id number] DOB: 10/24/1980 DOA: 03/31/2020  PCP: Patient, [name]  Admit date: 03/31/2020 Discharge date: 04/16/2020  Time spent: 40 minutes  Recommendations for Outpatient Follow-up:  1. Follow outpatient CBC/CMP 2. Follow with surgery outpatient in 2 weeks - Dr. Barry Dienes 3. Follow with oncology outpatient, Dr. Burr Medico 4. Follow liver biopsy outpatient  5. Continue augmentin x2weeks from 7/11 per ID (7/11-7/25) 6. Continue ostomy and wound care (BID wet to dry dressing changes) 7. Follow pain management outpatient  Discharge Diagnoses:  Principal Problem:   Sepsis ([place]) Active Problems:   Microcytic anemia   Malignant neoplasm of rectosigmoid junction (HCC)   Peritonitis with abscess of intestine (HCC)   Intra-abdominal abscess (HCC)   SOB (shortness of breath)   Moderate protein-calorie malnutrition (HCC)   Nausea & vomiting   TOA (tubo-ovarian abscess)   Colon cancer [facility])   Discharge Condition: stable  Diet recommendation: heart healthy  Filed Weights   04/08/20 1132 04/09/20 0642 04/13/20 0720  Weight: 82.9 kg 83.1 kg 73.1 kg    History of present illness:  Stephanie Grega Mittelmanis Stephanie [name] 39 y.o.femalewith medical history significant ofrecent admission from June 14 to June 18 with abdominal pain and abscess with Stephanie [name] question of sigmoidcolon malignancy versus diverticulitis. At that time she had an abnormal CT scan which also showed Stephanie [name] liver mass. She had Stephanie [name] barely elevated CEA at 4.9. She was placed on IV antibiotics, an attempt was made at liver biopsy however ultrasound suggested it might be Stephanie [name] hemangioma. An MRI was equivocal. She was sent home on Augmentin which she completed and was doing well with until the last few days. She has continued to have blood in her stool. Stools are soft and not fully formed. She is passing gas. She has had increasing nausea with emesis over the last 12 hours. She reports ongoing lower  abdominal cramping that is worse in the left lower quadrant. Her pain was 10 out of 10 requiring IV Dilaudid in the ED. She has an IUD in place, no sexual partner for the last 1 year. She denies vaginal discharge, vaginal bleeding, dysuria, hematuria, chest pain, or shortness of breath. In the ED she was sent for CT of the abdomen and pelvis which showed enlarging sigmoid colonic mass, which appeared which appears to be associated with focal perforation and has fistulized into the left adnexal region involving the left TOA. There are multiple enlarged lymph nodes which are bigger than before measuring up to 1.2 cm. The previously suspected metastatic lesion in the liver is also enlarged, small volume ascites. Patient placed empirically on IV antibiotics. GI and general surgery were consulted. Patient for exploratory laparotomy and partial colectomy with urethral stent placement today 7/40/2021 per general surgery. Patient with some complaints of shortness of breath the morning of 04/04/2020, concern for volume overload and as such chest x-ray obtained and patient given Stephanie [name] dose of IV Lasix, IV fluids discontinued.  She was admitted for abdominal pain, nausea, and vomiting and had imaging findings concerning for colonic mass with metastatic disease with tubo-ovarian abscess and focally contained perforation.  She's now s/p surgery with colectomy and end colostomy.  Cultures growing staph epidermidis.  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  She was seen by oncology who recommended Stephanie [name] liver biopsy and will follow up in 2-3 weeks.  She was discharged on 7/19 after liver bx with plan to follow up with  oncology and surgery outpatient.  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  Previously suspected mestatic lesion in segment 7 of the liver has enlarged.   General surgery consulted - now s/p colectomy and end colostomy on 04/04/20 (findings at surgery concerning for sigmoid colon cancer with pelvic abscess)- continue antibiotics Biopsies c/w invasive moderately differentiated adenocarcinoma, 6 cm, involving rectosigmoid junction.  Carcinoma invades into serosal surface with perforation and associated serositis.  Radial resection margin is positive for carcinoma; proximal and distal margins are not involved.  Lymphovascular invasion is present.  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  One tumor deposit.  (mesenteric LN, resection) Metastatic adenocarcinoma to 1 of 6 LN's, 1 tumor deposit. Oncology c/s - appreciate assistance - follow CT chest - bilateral pleural effusions L>R, no evidence of intrathoracic metastatic disease - needs outpatient follow up with oncology, chemo likely to be delayed with abscess/wound S/p liver biopsy on 7/19 - follow path ID c/s - recommending augmentin to treat for 2 weeks of abx from 7/11 when ready for d/c (has been narrowed to augmentin per surgery)  Abscess with staph epidermidis - pan sensitive Per surgery, will need wound care and colostomy care at home - discharge with home health - BID wet to dry dressing changes Soft diet per surgery Port placed on 7/15  3. Shortness of breath Secondary to volume overload. CXR from 7/7 with LLL retrocardiac collapse/consolidation and small L effusion Improved with lasix Continue to follow I/O, daily weights   4. Nausea/vomiting/abdominal pain.  Due to sigmoid colon cancer with pelvic abscess Continue analgesia/antiemetics  prn    # Right Side Pain: suspect this is musculoskeletal, trial of voltaren - improved  5. Acute blood loss anemia/chronic iron deficiency anemia Patient noted to have some scant bright red blood per rectum noted on previous exam. Hemoglobin stable around 9  GI saw earlier in hospitalization prior to surgery and at that time planned for colonoscopy in 6-8 weeks -> follow up with oncology and GI outpatient   6. Moderate protein calorie malnutrition Supplements ordered Continue soft diet  7. Hypokalemia Improved, follow   8. Urinary retention Foley catheter discontinued per general surgery on 04/05/2020 however patient noted to have urinary retention and foley was replaced Doing well without foley  # Constipation: continue bowel regimen  Procedures: Liver biopsy 7/19  Open sigmoid colectomy and end colostomy 7/7 by general surgery Cystoscopy, retrograde pyelogram with interpretation, bilateral temporary ureteral stent placement 7/7 by urology  7/15 insertion ofright internal jugularport-Tatyanna Cronk-cath with ultrasound and fluoro guidance  Consultations:  Surgery  Urology  ID  Oncology  Gastroenterology  IR  Discharge Exam: Vitals:   04/16/20 1254 04/16/20 1350  BP: 117/76 122/74  Pulse: 64 70  Resp: 14 16  Temp: 98.7 F (37.1 C) 98.6 F (37 C)  SpO2: 100% 100%   Feels ready for discharge today Pain is ok  General: No acute distress. Cardiovascular: Heart sounds show Kamera Dubas regular rate, and rhythm Lungs: Clear to auscultation bilaterally Abdomen: Soft, nontender, nondistended - ostomy, dressing in place Neurological: Alert and oriented 3. Moves all extremities 4 .  Cranial nerves II through XII grossly intact. Skin: Warm and dry. No rashes or lesions. Extremities: No clubbing or cyanosis. No edema Discharge Instructions   Discharge Instructions    Call MD for:  difficulty breathing, headache or visual disturbances   Complete by: As directed    Call  MD for:  extreme fatigue   Complete by: As directed    Call MD for:  hives   Complete by: As directed    Call MD for:  persistant dizziness or light-headedness   Complete by: As directed    Call MD for:  persistant nausea and vomiting   Complete by: As directed    Call MD for:  redness, tenderness, or signs of infection (pain, swelling, redness, odor or green/yellow discharge around incision site)   Complete by: As directed    Call MD for:  severe uncontrolled pain   Complete by: As directed    Call MD for:  temperature >100.4   Complete by: As directed    Diet - low sodium heart healthy   Complete by: As directed    Discharge instructions   Complete by: As directed    You were seen for colon adenocarcinoma (cancer).  You had Fischer Halley colectomy and end colostomy on 7/7 by surgery.  You've been placed on antibiotics for the pelvic abscess that you had.  You'll continue augmentin for another 7 days.    You had Joliyah Lippens liver biopsy on 7/19 with results that are pending.  Please follow up these results with Dr. Burr Medico.    Please follow up with surgery as an outpatient.  You should start wet to dry dressing changes at home for your surgical wound twice daily.  You'll need to follow up with Dr. Barry Dienes in surgery clinic in about 2 weeks.  We'll have home health come out to see you for wound care and colostomy care.    You can shower and cleanse the wound with gentle soap and water.    Return for new, recurrent, or worsening symptoms.  Please ask your PCP to request records from this hospitalization so they know what was done and what the next steps will be.   Discharge wound care:   Complete by: As directed    Continue twice daily wet to dry dressing changes.  Damp gauze dressing packed into wound, top with ABD pad and secure with tape.  Change twice daily.   Increase activity slowly   Complete by: As directed      Allergies as of 04/16/2020   No Known Allergies     Medication List    TAKE  these medications   amoxicillin-clavulanate 875-125 MG tablet Commonly known as: AUGMENTIN Take 1 tablet by mouth every 12 (twelve) hours for 7 days.   diclofenac Sodium 1 % Gel Commonly known as: VOLTAREN Apply 2 g topically 4 (four) times daily.   famotidine 20 MG tablet Commonly known as: PEPCID Take 1 tablet (20 mg total) by mouth 2 (two) times daily.   ferrous sulfate 325 (65 FE) MG tablet Take 1 tablet (325 mg total) by mouth 2 (two) times daily with Chenell Lozon meal.   methocarbamol 500 MG tablet Commonly known as: ROBAXIN Take 2 tablets (1,000 mg total) by mouth every 6 (six) hours as needed for muscle spasms.   ondansetron 4 MG disintegrating tablet Commonly known as: Zofran ODT Take 1 tablet (4 mg total) by mouth every 8 (eight) hours as needed for nausea or vomiting.   oxyCODONE 5  MG immediate release tablet Commonly known as: Roxicodone Take 1-2 tablets (5-10 mg total) by mouth every 4 (four) hours as needed for up to 7 days.   PARAGARD INTRAUTERINE COPPER IU 1 Device by Intrauterine route once.   polyethylene glycol 17 g packet Commonly known as: MIRALAX / GLYCOLAX Take 17 g by mouth 2 (two) times daily. What changed: when to take this   senna-docusate 8.6-50 MG tablet Commonly known as: Senokot-S Take 2 tablets by mouth 2 (two) times daily.            Durable Medical Equipment  (From admission, onward)         Start     Ordered   04/07/20 1428  For home use only DME 3 n 1  Once        04/07/20 1427           Discharge Care Instructions  (From admission, onward)         Start     Ordered   04/16/20 0000  Discharge wound care:       Comments: Continue twice daily wet to dry dressing changes.  Damp gauze dressing packed into wound, top with ABD pad and secure with tape.  Change twice daily.   04/16/20 1639         No Known Allergies  Follow-up Information    [facility] Follow up.   Specialty: [place] Why: to  provide home health nurse visits 2x/week for 3 weeks Contact information: [address] [place] [address] [phone]        [name], MD Follow up.   Specialties: Hematology, Oncology Why: Call for follow up  Contact information: [address] [address] [phone]        [name], MD Follow up.   Specialty: General Surgery Why: Call for Khiyan Crace follow up appointment  Contact information: [address] [place] [address] [phone]                The results of significant diagnostics from this hospitalization (including imaging, microbiology, ancillary and laboratory) are listed below for reference.    Significant Diagnostic Studies: CT CHEST WO CONTRAST  Result Date: 04/13/2020 CLINICAL DATA:  Colorectal cancer, staging examination, abdominal pain EXAM: CT CHEST WITHOUT CONTRAST TECHNIQUE: Multidetector CT imaging of the chest was performed following the standard protocol without IV contrast. COMPARISON:  None. FINDINGS: Cardiovascular: Right internal jugular chest port tip is seen within the right atrium. Cardiac size within normal limits. No pericardial effusion. No significant coronary artery calcification. Central pulmonary arteries are of normal caliber. Thoracic aorta is unremarkable on this noncontrast examination. Mediastinum/Nodes: No pathologic thoracic adenopathy. Lungs/Pleura: Small right and small to moderate left pleural effusions have developed with associated bibasilar compressive atelectasis with subtotal collapse of the left lower lobe. No focal pulmonary nodules within the aerated pulmonary parenchyma. No pneumothorax. Central airways are widely patent. Upper Abdomen: Hypoechoic mass within the right hepatic lobe in keeping with Angeligue Bowne hepatic metastasis is better seen on prior abdominal CT examination of 03/31/2020, but appears grossly unchanged measuring roughly 23 mm 2028 mm on axial image # 121/2.  Limited images of the upper abdomen are otherwise unremarkable. Musculoskeletal: No lytic or blastic bone lesions are seen. IMPRESSION: Interval development of bilateral pleural effusions, left slightly greater than right, with resultant bibasilar atelectasis including subtotal collapse of the left lower lobe. No evidence of intrathoracic metastatic disease, though  evaluation of the collapsed parenchyma is limited. Hepatic metastasis again demonstrated. Electronically Signed   By: Fidela Salisbury MD   On: 04/13/2020 19:57   CT ABDOMEN PELVIS W CONTRAST  Result Date: 03/31/2020 CLINICAL DATA:  39 year old female with history of left lower quadrant abdominal pain. EXAM: CT ABDOMEN AND PELVIS WITH CONTRAST TECHNIQUE: Multidetector CT imaging of the abdomen and pelvis was performed using the standard protocol following bolus administration of intravenous contrast. CONTRAST:  114mL OMNIPAQUE IOHEXOL 300 MG/ML  SOLN COMPARISON:  CT the abdomen and pelvis 03/12/2020. FINDINGS: Lower chest: Unremarkable. Hepatobiliary: Large hypovascular lesion in segment 7 of the liver measuring 3.0 x 2.4 cm, increased in size compared to the prior study (previously 2.5 x 1.8 cm when measured in Minor Iden similar fashion on prior examination). No other new hepatic lesions. No intra or extrahepatic biliary ductal dilatation. Gallbladder is normal in appearance. Pancreas: No pancreatic mass. No pancreatic ductal dilatation. No peripancreatic fluid collections or inflammatory changes. Spleen: Unremarkable. Adrenals/Urinary Tract: Bilateral kidneys and bilateral adrenal glands are unremarkable in appearance. No hydroureteronephrosis. Urinary bladder is normal in appearance. Stomach/Bowel: Normal appearance of the stomach. No pathologic dilatation of small bowel or colon. Previous the noted lesion in the mid sigmoid colon appears larger than the prior examination estimated to measure approximately 6.7 x 3.2 x 4.4 cm (coronal image 48 of series 4 and  sagittal image 78 of series 5). Adjacent to this there is Natalea Sutliff very complex collection of gas and fluid which is now intimately associated with the left adnexa measuring approximately 6.5 x 6.2 x 6.6 cm (axial image 75 of series 2 and coronal image 52 of series 4). This collection has fluid of heterogeneous attenuation and multiple internal locules of gas, as well as multiple areas of internal enhancement. This is associated with Cabell Lazenby serpiginous structure with peripheral enhancement in the left adnexal region, likely to represent dilated fallopian tube which also contains gas and fluid. Notably, this collection appears to communicate with the previously described colonic mass across the midline best appreciated on axial image 70 of series 2. Normal appendix. Vascular/Lymphatic: No significant atherosclerotic disease, aneurysm or dissection noted in the abdominal or pelvic vasculature. Multiple enlarged lymph nodes in the pelvis measuring up to 1.2 cm in short axis adjacent to the inferior mesenteric artery (axial image 61 of series 2). Borderline enlarged retroperitoneal lymph nodes are also noted measuring up to 8 mm in short axis. Reproductive: IUD present in the uterus. Uterus and right ovary are otherwise unremarkable in appearance. Large complex gas and fluid collection intimately associated with the left adnexa (discussed above). Tubular serpiginous area with rim enhancement also noted in the left adnexal region. Other: Small volume of ascites.  No pneumoperitoneum. Musculoskeletal: There are no aggressive appearing lytic or blastic lesions noted in the visualized portions of the skeleton. IMPRESSION: 1. Previously noted sigmoid colon mass appears increased in size, and again appears to be associated with Ajit Errico focal contained perforation which crosses the midline and has fistulized into the left adnexal region where there is now what appears to be Sakai Wolford large left tubo-ovarian abscess, as detailed above. This is also  associated with multiple enlarged lymph nodes in the pelvis measuring up to 1.2 cm in short axis and borderline enlarged retroperitoneal lymph nodes, concerning for metastatic disease. In addition, previously suspected metastatic lesion in segment 7 of the liver has enlarged. 2. Small volume of ascites. 3. Additional incidental findings, as above. These results were called by telephone at the time  of interpretation on 03/31/2020 at 11:58 am to provider [facility], who verbally acknowledged these results. Electronically Signed   By: Vinnie Langton M.D.   On: 03/31/2020 12:00   US BIOPSY (LIVER)  Result Date: 04/16/2020 CLINICAL DATA:  History of colon carcinoma. Enlarging liver lesion. Biopsy requested EXAM: ULTRASOUND-GUIDED CORE LIVER BIOPSY TECHNIQUE: An ultrasound guided liver biopsy was thoroughly discussed with the patient and questions were answered. The benefits, risks, alternatives, and complications were also discussed. The patient understands and wishes to proceed with the procedure. Christina Waldrop verbal as well as written consent was obtained. Survey ultrasound of the liver was performed, the right lobe lesion localized, and an appropriate skin entry site was determined. Skin site was marked, prepped with chlorhexidine, and draped in usual sterile fashion, and infiltrated locally with 1% lidocaine. Intravenous Fentanyl 165mcg and Versed 3mg  were administered as conscious sedation during continuous monitoring of the patient's level of consciousness and physiological / cardiorespiratory status by the radiology RN, with Cordie Beazley total moderate sedation time of 10 minutes. Macall Mccroskey 17 gauge trocar needle was advanced under ultrasound guidance into the liver to the margin of the lesion. 2 solid-appearing coaxial 18gauge core samples were then obtained through the guide needle. The guide needle was removed. Post procedure scans demonstrate no apparent complication. COMPLICATIONS: COMPLICATIONS None immediate FINDINGS: Echogenic  lesion in the right lobe was localized corresponding to CT findings. Representative core biopsy samples obtained as above. IMPRESSION: 1. Technically successful ultrasound guided core liver lesion biopsy. Electronically Signed   By: Lucrezia Europe M.D.   On: 04/16/2020 13:18   DG CHEST PORT 1 VIEW  Result Date: 04/04/2020 CLINICAL DATA:  Preoperative evaluation for exploratory laparotomy EXAM: PORTABLE CHEST 1 VIEW COMPARISON:  03/31/2020 abdomen CT, 10/19/2012 chest x-ray FINDINGS: Lower lung volumes. Left lower lobe collapse/consolidation noted with an associated small left effusion. Difficult to exclude left lower lung pneumonia. Normal heart size and vascularity. Trachea midline. No acute osseous finding. IMPRESSION: Left lower lobe retrocardiac collapse/consolidation and small left effusion. Low lung volumes. Electronically Signed   By: Jerilynn Mages.  Shick M.D.   On: 04/04/2020 10:27   DG Abd Portable 1V  Result Date: 04/11/2020 CLINICAL DATA:  Abdominal pain, vomiting EXAM: PORTABLE ABDOMEN - 1 VIEW COMPARISON:  12/26/2016.  CT 03/31/2020 FINDINGS: Nonobstructive bowel gas pattern. Left lower quadrant ostomy noted. IUD present in the pelvis. No free air or organomegaly. No suspicious calcification. IMPRESSION: Left lower quadrant ostomy noted. No evidence of bowel obstruction. No acute findings. Electronically Signed   By: Rolm Baptise M.D.   On: 04/11/2020 11:05   DG C-Arm 1-60 Min-No Report  Result Date: 04/12/2020 Fluoroscopy was utilized by the requesting physician.  No radiographic interpretation.   DG C-Arm 1-60 Min-No Report  Result Date: 04/04/2020 Fluoroscopy was utilized by the requesting physician.  No radiographic interpretation.    Microbiology: No results found for this or any previous visit (from the past 240 hour(s)).   Labs: Basic Metabolic Panel: Recent Labs  Lab 04/12/20 0438 04/13/20 0440 04/14/20 0519 04/15/20 0605 04/16/20 0542  NA 137 139 138 140 137  K 3.4* 4.3 3.3* 3.8  3.7  CL 101 100 101 100 99  CO2 24 25 28 30 28   GLUCOSE 91 121* 89 86 90  BUN <5* <5* 6 <5* 5*  CREATININE 0.41* 0.36* 0.32* 0.45 0.48  CALCIUM 8.0* 8.9 8.3* 8.6* 8.8*  MG 1.7 2.0 1.7 1.9 2.0  PHOS 3.8 3.7 4.4 5.2* 5.0*   Liver Function Tests: Recent  Labs  Lab 04/12/20 0438 04/13/20 0440 04/14/20 0519 04/15/20 0605 04/16/20 0542  AST 26 19 21 17 16   ALT 14 13 14 13 14   ALKPHOS 48 52 43 47 51  BILITOT 0.7 0.4 0.5 0.5 0.5  PROT 5.7* 6.2* 5.7* 6.1* 6.4*  ALBUMIN 2.3* 2.4* 2.3* 2.7* 2.7*   No results for input(s): LIPASE, AMYLASE in the last 168 hours. No results for input(s): AMMONIA in the last 168 hours. CBC: Recent Labs  Lab 04/12/20 0438 04/13/20 0440 04/14/20 0519 04/15/20 0605 04/16/20 0542  WBC 9.8 9.9 7.2 10.4 12.2*  NEUTROABS 7.7 8.1* 4.5 7.7 10.1*  HGB 9.8* 10.4* 9.1* 9.6* 10.2*  HCT 31.6* 33.7* 29.6* 30.5* 32.8*  MCV 81.7 81.8 82.9 82.0 82.4  PLT 366 440* 432* 412* 422*   Cardiac Enzymes: No results for input(s): CKTOTAL, CKMB, CKMBINDEX, TROPONINI in the last 168 hours. BNP: BNP (last 3 results) Recent Labs    04/14/20 0832  BNP 35.7    ProBNP (last 3 results) No results for input(s): PROBNP in the last 8760 hours.  CBG: No results for input(s): GLUCAP in the last 168 hours.     Signed:  Fayrene Helper MD.  [facility] 04/16/2020, 9:16 PM

## 2020-04-16 NOTE — Procedures (Signed)
  Procedure: [place] core liver lesion 18g x2 EBL:   minimal Complications:  none immediate  See full dictation in [facility].  Dillard Cannon MD Main # [phone] Pager  [phone]

## 2020-04-16 NOTE — Progress Notes (Signed)
Discharge instructions given to pt and all questions were answered. Taught pt how to do wet to dry dressing changes and answered all questions.

## 2020-04-16 NOTE — Consult Note (Signed)
[place] Nurse ostomy follow up Stoma type/location: LLQ, end colostomy Stomal assessment/size: 1 1/2" round, budded, pink, pale, moist  Peristomal assessment: intact, MARSI noted left lateral tape border edge that is resolving, blisters have drained and skin is improving. New sites mentioned in my note earlier from the [facility] drape Treatment options for stomal/peristomal skin: using foam to protect previous skin irritation.  Output bloody, no stool per patient Ostomy pouching: 2pc. 2 3/4" with 2" barrier ring Education provided:  Patient performed pouch change with minimal cueing, drew pattern and cut new skin barrier, removed old pouch, placed 2" barrier ring. Open and closing lock and roll closure and understands use of wick to clean spout. Placed new skin barrier and attached new pouch to skin barrier.  Patient is familiar with burping gas from the pouch Understands pouch change frequency.  Has indigent paperwork in her supplies. Sending home with 8 pouching systems with rings.  Will need to connect with [place] asap once at home. I will check in with her tomorrow prior to DC unless she dC to home today  Enrolled patient in [place] Start Discharge program: Yes  [place] Nurse will follow along with you for continued support with ostomy teaching and care and wound care as needed.  Chigozie Basaldua [facility] MSN, [place], [place], [place], [place]

## 2020-04-16 NOTE — Consult Note (Signed)
[place] Nurse wound follow up Wound type: surgical  Wound bed: 100% clean, pink, moist Drainage (amount, consistency, odor) scant, serosanguinous in the patient's VAC canister, no odor Periwound: new areas of MARSI noted at the proximal edge of the NPWT dressing (drape) x 2 serous filled blisters, however when drape removed blisters drained spontaneously a portion of the fluid Dressing procedure/placement/frequency: Removed old NPWT dressing Filled wound with  _1__ piece of black foam, Sealed NPWT dressing at 117mm HG Patient received PO pain medication per bedside nurse prior to dressing change Patient tolerated procedure well Discussion with CCS PA, bedside RN and SW. Patient may not be eligible to receive HHRN or VAC at home.  I will update wound care orders to allow staff to DC [facility] if DC home today later or tomorrow and teach patient saline moist dressings. Dr. Lucia Gaskins discussed this with the patient and also stated patient could remove gauze dressings and shower. Will update orders to reflect this.    [place] nurse will continue to provide NPWT dressing changed due to the complexity of the dressing change.   Ostomy pouch intact, however patient needs to change PTD.    I will return when this patient is back from her liver bx. To have her complete ostomy pouch change.   [place], [place], [place]

## 2020-04-16 NOTE — Progress Notes (Signed)
[facility] Office:  [phone] General Surgery Progress Note   LOS: 16 days  POD -  4 Days Post-Op  Assessment and Plan: 1.  Status post ex lap with Hartmann's resection for sigmoid perforation - 04/04/2020 - Dr. Christiana Pellant with 2/21 nodes positive  WBC - 12,200 - 04/16/2020  Augmentin  Seen by Dr. Burr Medico  Patient stable for discharge home from surgical standpoint. Will need wound care and colostomy care - HHN to be arranged. If VAC not possible, can switch to BID wet to dry dressing changes.  Will need follow up with Dr. Barry Dienes in about 2 weeks.  2.  INSERTION PORT-A-CATH WITH ULTRASOUND - 04/12/2020 - Dr. Kieth Brightly 3.  For liver biopsy today 4.  Wound - VAC   Principal Problem:   Sepsis ([place]) Active Problems:   Microcytic anemia   Colorectal carcinoma ([place])   Peritonitis with abscess of intestine (HCC)   Intra-abdominal abscess (HCC)   SOB (shortness of breath)   Moderate protein-calorie malnutrition (HCC)   Nausea & vomiting   TOA (tubo-ovarian abscess)   Colon cancer (HCC)  Subjective:  Doing okay.  Unmarried.  She has an 40 yo.  She is staying at her father's house.  She works as a Surveyor, mining.  Objective:   Vitals:   04/15/20 2116 04/16/20 0553  BP: 120/88 113/79  Pulse: 67 72  Resp: 14 14  Temp: (!) 97.5 F (36.4 C) 98.7 F (37.1 C)  SpO2: 97% 96%     Intake/Output from previous day:  07/18 0701 - 07/19 0700 In: 1912 [P.O.:120; I.V.:1792] Out: 2550 [Urine:2550]  Intake/Output this shift:  Total I/O In: 193.3 [I.V.:193.3] Out: 600 [Urine:600]   Physical Exam:   General: WN WF who is alert and oriented.    HEENT: Normal. Pupils equal. .   Lungs: Clear.   Abdomen: Soft   Wound: Clean and superficial.  Ostomy in left abdomen.     Lab Results:    Recent Labs    04/15/20 0605 04/16/20 0542  WBC 10.4 12.2*  HGB 9.6* 10.2*  HCT 30.5* 32.8*  PLT 412* 422*    BMET   Recent Labs    04/15/20 0605 04/16/20 0542  NA 140 137    K 3.8 3.7  CL 100 99  CO2 30 28  GLUCOSE 86 90  BUN <5* 5*  CREATININE 0.45 0.48  CALCIUM 8.6* 8.8*    PT/INR   Recent Labs    04/16/20 0542  LABPROT 13.6  INR 1.1    ABG  No results for input(s): PHART, HCO3 in the last 72 hours.  Invalid input(s): PCO2, PO2   Studies/Results:  No results found.   Anti-infectives:   Anti-infectives (From admission, onward)   Start     Dose/Rate Route Frequency Ordered Stop   04/12/20 1039  ceFAZolin (ANCEF) 2-4 GM/100ML-% IVPB       Note to Pharmacy: Randa Evens  : cabinet override      04/12/20 1039 04/12/20 1130   04/12/20 0730  ceFAZolin (ANCEF) IVPB 2g/100 mL premix        2 g 200 mL/hr over 30 Minutes Intravenous On call to O.R. 04/12/20 0728 04/12/20 1119   04/10/20 1330  amoxicillin-clavulanate (AUGMENTIN) 875-125 MG per tablet 1 tablet     Discontinue     1 tablet Oral Every 12 hours 04/10/20 1218 04/22/20 0959   04/08/20 1400  vancomycin (VANCOCIN) IVPB 1000 mg/200 mL premix  Status:  Discontinued  1,000 mg 200 mL/hr over 60 Minutes Intravenous Every 8 hours 04/08/20 1136 04/08/20 1322   04/08/20 0000  vancomycin (VANCOREADY) IVPB 750 mg/150 mL  Status:  Discontinued        750 mg 150 mL/hr over 60 Minutes Intravenous Every 8 hours 04/07/20 1628 04/08/20 1136   04/07/20 1645  vancomycin (VANCOREADY) IVPB 1500 mg/300 mL        1,500 mg 150 mL/hr over 120 Minutes Intravenous STAT 04/07/20 1638 04/07/20 1847   04/07/20 1630  vancomycin (VANCOREADY) IVPB 1500 mg/300 mL  Status:  Discontinued        1,500 mg 150 mL/hr over 120 Minutes Intravenous  Once 04/07/20 1627 04/07/20 1628   04/04/20 1045  cefoTEtan (CEFOTAN) 2 g in sodium chloride 0.9 % 100 mL IVPB        2 g 200 mL/hr over 30 Minutes Intravenous On call to O.R. 04/03/20 1639 04/04/20 1213   03/31/20 2000  ceFEPIme (MAXIPIME) 2 g in sodium chloride 0.9 % 100 mL IVPB  Status:  Discontinued        2 g 200 mL/hr over 30 Minutes Intravenous Every 8 hours  03/31/20 1408 04/10/20 0917   03/31/20 2000  metroNIDAZOLE (FLAGYL) IVPB 500 mg  Status:  Discontinued        500 mg 100 mL/hr over 60 Minutes Intravenous Every 8 hours 03/31/20 1410 04/10/20 0917   03/31/20 1600  ceFEPIme (MAXIPIME) 2 g in sodium chloride 0.9 % 100 mL IVPB  Status:  Discontinued        2 g 200 mL/hr over 30 Minutes Intravenous  Once 03/31/20 1549 03/31/20 1551   03/31/20 1200  piperacillin-tazobactam (ZOSYN) IVPB 3.375 g        3.375 g 100 mL/hr over 30 Minutes Intravenous  Once 03/31/20 1159 03/31/20 1341      Alphonsa Overall, MD, [facility] Office: [phone] 04/16/2020

## 2020-04-16 NOTE — Progress Notes (Signed)
PT Cancellation Note  Patient Details Name: Stephanie [name] MRN: [id number] DOB: 11-29-1980   Cancelled Treatment:    Reason Eval/Treat Not Completed: Patient at procedure or test/unavailable, Out of room. Will check back another time.    Claretha Cooper 04/16/2020, 11:28 AM [place] Pager [phone] Office [phone]

## 2020-04-17 LAB — SURGICAL PATHOLOGY

## 2020-04-17 NOTE — Progress Notes (Signed)
Spoke with patient introduced myself and explained my role as Art therapist.  I gave her my direct contact number to call with any questions or concerns.  She is aware of her appointment on 8/5 with [place] at [facility].  Per Dr. Burr Medico I informed her that the liver biopsy pathology did show adenocarcinoma.  She verbalized an understanding and had not further questions at this time.

## 2020-04-18 ENCOUNTER — Encounter: Payer: Self-pay | Admitting: *Deleted

## 2020-04-18 NOTE — Progress Notes (Signed)
[place] work  Holiday representative spoke to [employer] with [employer] to confirm patients diagnosis.  Alexis plans to re-screen patient for disability medicaid today.  CSW team will follow up with patient at her next [facility] appointment.  Johnnye Lana, MSW, LCSW, OSW-C Clinical Social Worker [facility] [phone]

## 2020-04-19 ENCOUNTER — Inpatient Hospital Stay: Payer: Self-pay | Admitting: Nurse Practitioner

## 2020-04-23 ENCOUNTER — Telehealth: Payer: Self-pay

## 2020-04-23 NOTE — Telephone Encounter (Addendum)
Stephanie [name] called requesting refill for her pain medication.  She is taking 2 tablets in the am and then 1 every 4 hours. She rates her pain 7/10 in am and 5/10 other times.   Reviewed above with Dr. Burr Medico.  She will not fill pain medication at this point.  Stephanie [name] is to call Dr. Marlowe Aschoff office for pain medicine.  I called Stephanie [name] there was no answer and I was unable to leave a message.

## 2020-05-02 NOTE — Progress Notes (Addendum)
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 Overall findings are highly concerning for colorectal carcinoma involving the sigmoid colon with an associated perforation and adjacent abscess and phlegmon formation as detailed above. Currently, no collection is amenable to percutaneous drainage given their small size and location. 2. New 2 cm mass in the right hepatic lobe concerning for metastatic disease to the liver until proven otherwise. 3. Enlarged regional lymph nodes as detailed above is concerning for nodal metastatic disease. 4. Large stool burden. 5. Prominent pelvic veins which can be seen in patients with pelvic congestion syndrome.   03/13/2020 Imaging   ABD US IMPRESSION: Approximately 2.1 x 2.4 x 2.0 cm lobular homogeneously echogenic lesion in the right hepatic dome corresponds with the abnormality seen on the prior CT scan. Sonographically, this appearance is highly suggestive of a benign hemangioma.   Recommend MRI of the abdomen with gadolinium contrast which may provide a noninvasive diagnosis of benign hemangioma.    03/13/2020 Imaging   MR ABD W/WO CONTRAST Hepatobiliary: Diffuse low signal intensity throughout the hepatic parenchyma on T2 weighted images, presumably a consequence of recent Feraheme injection. In segment 7 of the liver (axial image 8  of series 5) there is a 2.5 x 1.9 cm well-defined lesion which is slightly T2 hyperintense. This lesion appears hyperintense on pre gadolinium T1 weighted images (likely a consequence of Feraheme). Interpretation of enhancement within the lesion is compromised by presence of Feraheme. No other hepatic lesions are confidently identified on today's examination. No intra or extrahepatic biliary ductal dilatation. Gallbladder is normal in appearance.   03/31/2020 Imaging   CT AP W contrast IMPRESSION: 1. Previously noted sigmoid colon mass appears increased in size, and again appears to be associated with a focal contained perforation which crosses the midline and has fistulized into the left adnexal region where there is now what appears to be a large left tubo-ovarian abscess, as detailed above. This is also associated with multiple enlarged lymph nodes in the pelvis measuring up to 1.2 cm in short axis and borderline enlarged retroperitoneal lymph nodes, concerning for metastatic disease. In addition, previously suspected metastatic lesion in segment 7 of the liver has enlarged. 2. Small volume of ascites. 3. Additional incidental findings, as above.   04/04/2020 Cancer Staging   Staging form: Colon and Rectum, AJCC 8th Edition - Pathologic stage from 04/04/2020: pT4a, pN1b, cM1 - Signed by [name], NP on 05/07/2020   04/04/2020 Procedure   Paracentesis, path showed no malignant cells (mixed acute and chronic inflammation present)   04/04/2020 Surgery   Open sigmoid colectomy and end colostomy by Dr. [name]   04/04/2020 Pathology Results   FINAL MICROSCOPIC DIAGNOSIS: A. COLON, RECTOSIGMOID, RESECTION: - Invasive moderately differentiated adenocarcinoma, 6 cm, involving rectosigmoid junction - Carcinoma invades into serosal surface with perforation and associated serositis - Radial resection margin is positive for carcinoma; proximal and distal margins are not involved -  Lymphovascular invasion  is present - Metastatic carcinoma to one of fifteen lymph nodes (1/15); one tumor deposit - See oncology table B. LYMPH NODES, MESENTERIC, RESECTION: - Metastatic adenocarcinoma to one of six lymph nodes (1/6) - One tumor deposit  Addendum to note 2 involved lymph nodes (of 21 examined nodes) pT4a,pN1b MMR-normal, preserved expression of MLH1, MSH2, MSH6, PMS2   04/13/2020 Imaging   CT chest without contrast IMPRESSION: Interval development of bilateral pleural effusions, left slightly greater than right, with resultant bibasilar atelectasis including subtotal collapse of the left lower lobe. No evidence of intrathoracic metastatic disease, though evaluation of the collapsed parenchyma is limited. Hepatic metastasis again demonstrated.     04/16/2020 Pathology Results   FINAL MICROSCOPIC DIAGNOSIS:  A. LIVER, RIGHT LOBE, BIOPSY:  - Adenocarcinoma.  COMMENT:  The morphology is compatible with the provided clinical history of colorectal carcinoma.    05/23/2020 -  Chemotherapy   PENDING FOLFOX q2 weeks       CURRENT THERAPY: PENDING FOLFOXIRI q2 weeks   BRIEF HPI (from Dr. Ernestina Penna initial inpatient consult note):pt is a 39 year old Caucasian female without significant past medical history, presented with worsening abdominal pain.  She was admitted from June 14 to July 18 with abdominal abscess and a questionable sigmoid colon indeterminate liver lesion.  She was treated with IV antibiotics.  Abdominal MRI was done to evaluate her liver lesion, which was indeterminate.  She was discharged home.  She came back to ED on April 04, 2020, for worsening abdominal pain, nausea, and vomiting.  She was admitted for further evaluation.  Reviewed the CT showed colon mass which appears to be associated with focal perforation.  She underwent urgent open sigmoid colectomy and and colostomy and port placement Drs. Barry Dienes and Kessinger on April 04, 2020.  She was found to have  peritonitis with pus throughout low abdomen.   INTERVAL HISTORY [name] returns for her first outpatient follow-up as scheduled.  She is doing well overall, recovering from surgery.  Pain is improving, she is able to move around and do more.  Pain level usually around a 3-6 out of 10, Tylenol is mostly helpful, occasionally she might need more after more activity.  Wound closed significantly in the last week but still open.  She continues wet-to-dry packing and dressing changes.  She is about 50% back to her energy level, she is up out of bed and active at home but not walking outside yet.  She is working 20 hours/week from home.  Colostomy is working well, BM 1-2 times per day consistency varies but not watery takes a stool softener occasionally.  She wakes up nauseous, Zofran helps.  She is able to eat and drink.  Denies vomiting.  No recent fever, chills, cough, chest pain, dyspnea.  MEDICAL HISTORY:  Past Medical History:  Diagnosis Date   BV (bacterial vaginosis)    Lactose intolerance 03/12/2020   UTI (lower urinary tract infection)     SURGICAL HISTORY: Past Surgical History:  Procedure Laterality Date   CESAREAN SECTION     CYSTOSCOPY WITH STENT PLACEMENT  04/04/2020   Procedure: CYSTOSCOPY WITH STENT PLACEMENT;  Surgeon: [name], MD;  Location: [facility];  Service: General;;   LAPAROTOMY N/A 04/04/2020   Procedure: EXPLORATORY LAPAROTOMY;  Surgeon: [name], MD;  Location: [facility];  Service: General;  Laterality: N/A;   PORTACATH PLACEMENT Right 04/12/2020   Procedure: INSERTION PORT-A-CATH WITH ULTRASOUND;  Surgeon: Mickeal Skinner, MD;  Location: [facility];  Service: General;  Laterality: Right;    I have reviewed the social history and family history with the patient and they are unchanged from previous note.  ALLERGIES:  has No Known Allergies.  MEDICATIONS:  Current Outpatient Medications  Medication Sig Dispense Refill   famotidine (PEPCID) 20 MG tablet  Take 1 tablet (20 mg total) by mouth 2 (two) times daily. 60 tablet 0   methocarbamol (ROBAXIN) 500 MG tablet Take 2 tablets (1,000 mg total) by mouth every 6 (six) hours as needed for muscle spasms. 240 tablet 0   ondansetron (ZOFRAN ODT) 4 MG disintegrating tablet Take 1 tablet (4 mg total) by mouth every 8 (eight) hours as needed for nausea or vomiting. 30 tablet 0   PARAGARD INTRAUTERINE COPPER IU 1 Device by Intrauterine route once.      senna-docusate (SENOKOT-S) 8.6-50 MG tablet Take 2 tablets by mouth 2 (two) times daily. 120 tablet 0   diclofenac Sodium (VOLTAREN) 1 % GEL Apply 2 g topically 4 (four) times daily. 50 g 0   ferrous sulfate 325 (65 FE) MG tablet Take 1 tablet (325 mg total) by mouth 2 (two) times daily with a meal. 60 tablet 0   polyethylene glycol (MIRALAX / GLYCOLAX) 17 g packet Take 17 g by mouth 2 (two) times daily. 14 each 0   traMADol (ULTRAM) 50 MG tablet Take 1 tablet (50 mg total) by mouth every 12 (twelve) hours as needed for severe pain. 10 tablet 0   No current facility-administered medications for this visit.    PHYSICAL EXAMINATION: ECOG PERFORMANCE STATUS: 2 - Symptomatic, <50% confined to bed  Vitals:   05/03/20 1350  BP: 102/76  Pulse: 94  Resp: 18  Temp: 97.7 F (36.5 C)  SpO2: 99%   Filed Weights   05/03/20 1350  Weight: 155 lb 14.4 oz (70.7 kg)    GENERAL:alert, no distress and comfortable SKIN: No rash to exposed skin EYES: sclera clear LUNGS: clear with normal breathing effort HEART: regular rate & rhythm, no lower extremity edema ABDOMEN:abdomen soft, non-tender and normal bowel sounds.  Left quadrant colostomy in place.  Midline incision open without surrounding erythema or drainage.  See image below NEURO: alert & oriented x 3 with fluent speech, no focal motor/sensory deficits PAC without erythema      LABORATORY DATA:  I have reviewed the data as listed CBC Latest Ref Rng & Units 05/03/2020 04/16/2020 04/15/2020  WBC  4.0 - 10.5 K/uL 6.3 12.2(H) 10.4  Hemoglobin 12.0 - 15.0 g/dL 11.5(L) 10.2(L) 9.6(L)  Hematocrit 36 - 46 % 36.3 32.8(L) 30.5(L)  Platelets 150 - 400 K/uL 212 422(H) 412(H)     CMP Latest Ref Rng & Units 05/03/2020 04/16/2020 04/15/2020  Glucose 70 - 99 mg/dL 108(H) 90 86  BUN 6 - 20 mg/dL 8 5(L) <5(L)  Creatinine 0.44 - 1.00 mg/dL 0.70 0.48 0.45  Sodium 135 - 145 mmol/L 139 137 140  Potassium 3.5 - 5.1 mmol/L 3.5 3.7 3.8  Chloride 98 - 111 mmol/L 106 99 100  CO2 22 - 32 mmol/L _0 Calcium 8.9 - 10.3 mg/dL 9.2 8.8(L) 8.6(L)  Total Protein 6.5 - 8.1 g/dL 7.1 6.4(L) 6.1(L)  Total Bilirubin 0.3 - 1.2 mg/dL 0.2(L) 0.5 0.5  Alkaline Phos 38 - 126 U/L 64 51 47  AST 15 - 41 U/L 12(L) 16 17  ALT 0 - 44 U/L _1 RADIOGRAPHIC STUDIES: I have personally reviewed the radiological images as listed and  agreed with the findings in the report. No results found.   ASSESSMENT & PLAN: 39 year old female without significant past medical history  1.  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 Barry Dienes on 04/04/20 she was found to have perforation and positive radial margin.  Liver biopsy on 04/16/2020 confirmed metastatic disease from her colon cancer -We again reviewed her work-up which is consistent with stage IV colon cancer s/p surgical resection of the primary tumor with oligo liver metastasis, overall the disease burden appears to be low.  We discussed in this case when the patient is young and fit we treat aggressively to potentially cure her disease which is achievable and approximately 20% of patients with multimodality therapies. -Dr. Burr Medico is recommending systemic treatment with FOLFOXIRI q2 weeks for 3-6 months Chemotherapy consent: Side effects including but not limited to fatigue,  nausea, vomiting, diarrhea, hair loss, cold sensitivity, neuropathy, fluid retention, renal and kidney dysfunction, neutropenic fever, need for blood transfusion, bleeding, were discussed with patient in great detail. She agrees to proceed. -If she has dramatic response with limited residual metastatic disease and no other distant metastasis, she may be a candidate for liver resection or targeted liver therapy such as ablation -I reviewed her foundation One which shows K-ras mutation, she is not a candidate for EGFR inhibitor.  We will likely add Avastin once her wound completely heals.  -Ms. Mittleman appears stable.  She continues to recover from surgery, exam shows open midline incision, she continues wet to dry packing with dressing changes.  Pain and PS are improving, but not yet adequate to begin chemo.  She was given tramadol 10 tabs for pain exacerbations in hopes this will help her become more active with better pain management - she will follow-up with Dr. Barry Dienes on 05/04/2020 -Baseline labs show improved anemia, stable CMP, iron/ferritin and CEA are pending from today -The plan is to see her back in 2 weeks to determine chemo start date, possibly 3 weeks from now. We discussed starting cycle 1 with 5FU/leuc if wound has not closed yet -Her port has been placed, she will attend chemo education session when she returns for next visit  2.  Intrabdominal abscess secondary to sigmoid colon perforation -Culture showed staph epidermidis, treated with cefepime and fagyl  3.  Anemia, secondary to #1 and iron deficiency -She had mild intermittent blood in her stool for the past several months, attributed to hemorrhoids -S/p IV Feraheme in 02/2020 during hospitalization, Ferritin improved to 124 -Denies recurrent bleeding lately -Not currently on oral iron, Hgb improved to 11.5 today -ferritin is 32 today, will give an additional dose next week   Plan: [facility] course and work-up reviewed -Rx  tramadol 50 mg p.o. every 12 as needed for severe pain, #10 -Follow-up Dr. Barry Dienes on 05/04/2020 -Continue wound care -Increase activity, nutrition, hydration; followed by dietitian -Follow-up in 2 weeks to monitor improvement in wound healing, plan to start chemo in 3 weeks -Chemo education session with next follow-up  All questions were answered. The patient knows to call the clinic with any problems, questions or concerns. No barriers to learning was detected.     [name], NP 05/07/20   Addendum  I have seen the patient, examined her. I agree with the assessment and and plan and have edited the notes.  Pt is recovering well from surgery but her incision wound is still open.  We reviewed her liver biopsy results, discussed the prognosis of metastatic colon cancer with oligo liver mets, and possibility of care with treatment (about 20-30%).  I recommend intensive chemotherapy FOLFOXFIRI every 2 weeks for 4-6 months, followed by liver resection or ablation, with the goal of cure, if she response well to chemo. We also discussed her foundation 1 genomic testing results, she has KRAS mutation, not a candidate for EGFR inhibitor, but with the benefit from bevacizumab.  I plan to add on when her wound heals completely,and will hold it 2 months before surgery.   Plan to see her back in 2 weeks, plan to start chemo in 3 weeks if her wound heals well.   I spent a total of 40 minutes for her visit today.  [name]  05/03/2020

## 2020-05-03 ENCOUNTER — Inpatient Hospital Stay: Payer: BC Managed Care – PPO

## 2020-05-03 ENCOUNTER — Inpatient Hospital Stay: Payer: BC Managed Care – PPO | Attending: Nurse Practitioner | Admitting: Nurse Practitioner

## 2020-05-03 ENCOUNTER — Telehealth: Payer: Self-pay | Admitting: Hematology

## 2020-05-03 ENCOUNTER — Other Ambulatory Visit: Payer: Self-pay | Admitting: Nurse Practitioner

## 2020-05-03 ENCOUNTER — Other Ambulatory Visit: Payer: Self-pay

## 2020-05-03 ENCOUNTER — Encounter: Payer: Self-pay | Admitting: Nurse Practitioner

## 2020-05-03 VITALS — BP 102/76 | HR 94 | Temp 97.7°F | Resp 18 | Ht 69.0 in | Wt 155.9 lb

## 2020-05-03 DIAGNOSIS — Z9049 Acquired absence of other specified parts of digestive tract: Secondary | ICD-10-CM | POA: Insufficient documentation

## 2020-05-03 DIAGNOSIS — Z79899 Other long term (current) drug therapy: Secondary | ICD-10-CM | POA: Insufficient documentation

## 2020-05-03 DIAGNOSIS — C19 Malignant neoplasm of rectosigmoid junction: Secondary | ICD-10-CM | POA: Insufficient documentation

## 2020-05-03 DIAGNOSIS — D509 Iron deficiency anemia, unspecified: Secondary | ICD-10-CM | POA: Insufficient documentation

## 2020-05-03 DIAGNOSIS — Z5189 Encounter for other specified aftercare: Secondary | ICD-10-CM | POA: Diagnosis not present

## 2020-05-03 DIAGNOSIS — K651 Peritoneal abscess: Secondary | ICD-10-CM | POA: Insufficient documentation

## 2020-05-03 DIAGNOSIS — R188 Other ascites: Secondary | ICD-10-CM | POA: Insufficient documentation

## 2020-05-03 DIAGNOSIS — C787 Secondary malignant neoplasm of liver and intrahepatic bile duct: Secondary | ICD-10-CM | POA: Insufficient documentation

## 2020-05-03 DIAGNOSIS — Z933 Colostomy status: Secondary | ICD-10-CM | POA: Diagnosis not present

## 2020-05-03 DIAGNOSIS — Z5111 Encounter for antineoplastic chemotherapy: Secondary | ICD-10-CM | POA: Insufficient documentation

## 2020-05-03 DIAGNOSIS — Z87440 Personal history of urinary (tract) infections: Secondary | ICD-10-CM | POA: Diagnosis not present

## 2020-05-03 DIAGNOSIS — Z95828 Presence of other vascular implants and grafts: Secondary | ICD-10-CM

## 2020-05-03 LAB — IRON AND TIBC
Iron: 40 ug/dL — ABNORMAL LOW (ref 41–142)
Saturation Ratios: 15 % — ABNORMAL LOW (ref 21–57)
TIBC: 260 ug/dL (ref 236–444)
UIBC: 220 ug/dL (ref 120–384)

## 2020-05-03 LAB — CMP (CANCER CENTER ONLY)
ALT: 7 U/L (ref 0–44)
AST: 12 U/L — ABNORMAL LOW (ref 15–41)
Albumin: 3.5 g/dL (ref 3.5–5.0)
Alkaline Phosphatase: 64 U/L (ref 38–126)
Anion gap: 7 (ref 5–15)
BUN: 8 mg/dL (ref 6–20)
CO2: 26 mmol/L (ref 22–32)
Calcium: 9.2 mg/dL (ref 8.9–10.3)
Chloride: 106 mmol/L (ref 98–111)
Creatinine: 0.7 mg/dL (ref 0.44–1.00)
GFR, Est AFR Am: >60 mL/min (ref >60)
GFR, Estimated: >60 mL/min (ref >60)
Glucose, Bld: 108 mg/dL — ABNORMAL HIGH (ref 70–99)
Potassium: 3.5 mmol/L (ref 3.5–5.1)
Sodium: 139 mmol/L (ref 135–145)
Total Bilirubin: 0.2 mg/dL — ABNORMAL LOW (ref 0.3–1.2)
Total Protein: 7.1 g/dL (ref 6.5–8.1)

## 2020-05-03 LAB — TOTAL PROTEIN, URINE DIPSTICK: Protein, ur: NEGATIVE mg/dL

## 2020-05-03 LAB — FERRITIN: Ferritin: 32 ng/mL (ref 11–307)

## 2020-05-03 LAB — CBC WITH DIFFERENTIAL (CANCER CENTER ONLY)
Abs Immature Granulocytes: 0.01 10*3/uL (ref 0.00–0.07)
Basophils Absolute: 0 10*3/uL (ref 0.0–0.1)
Basophils Relative: 1 %
Eosinophils Absolute: 0.1 10*3/uL (ref 0.0–0.5)
Eosinophils Relative: 1 %
HCT: 36.3 % (ref 36.0–46.0)
Hemoglobin: 11.5 g/dL — ABNORMAL LOW (ref 12.0–15.0)
Immature Granulocytes: 0 %
Lymphocytes Relative: 22 %
Lymphs Abs: 1.4 10*3/uL (ref 0.7–4.0)
MCH: 26.2 pg (ref 26.0–34.0)
MCHC: 31.7 g/dL (ref 30.0–36.0)
MCV: 82.7 fL (ref 80.0–100.0)
Monocytes Absolute: 0.5 10*3/uL (ref 0.1–1.0)
Monocytes Relative: 8 %
Neutro Abs: 4.4 10*3/uL (ref 1.7–7.7)
Neutrophils Relative %: 68 %
Platelet Count: 212 10*3/uL (ref 150–400)
RBC: 4.39 MIL/uL (ref 3.87–5.11)
RDW: 19.2 % — ABNORMAL HIGH (ref 11.5–15.5)
WBC Count: 6.3 10*3/uL (ref 4.0–10.5)
nRBC: 0 % (ref 0.0–0.2)

## 2020-05-03 LAB — CEA (IN HOUSE-CHCC): CEA (CHCC-In House): 3.84 ng/mL (ref 0.00–5.00)

## 2020-05-03 MED ORDER — TRAMADOL HCL 50 MG PO TABS: 50.0000 mg | ORAL_TABLET | Freq: Two times a day (BID) | ORAL | 0 refills | Status: DC | PRN

## 2020-05-03 MED ORDER — SODIUM CHLORIDE 0.9% FLUSH
10.0000 mL | Freq: Once | INTRAVENOUS | Status: AC
  Administered 2020-05-03: 10 mL
  Filled 2020-05-03: qty 10

## 2020-05-03 MED ORDER — HEPARIN SOD (PORK) LOCK FLUSH 100 UNIT/ML IV SOLN
500.0000 [IU] | Freq: Once | INTRAVENOUS | Status: AC
  Administered 2020-05-03: 500 [IU]
  Filled 2020-05-03: qty 5

## 2020-05-03 NOTE — Telephone Encounter (Signed)
Scheduled per los. Gave avs and calendar  

## 2020-05-03 NOTE — Progress Notes (Signed)
Met with patient at her initial medical oncology consult today with Cira Rue NP and Dr. Burr Medico.  I explained my role as nurse navigator and she was given my card with my direct contact information.  I have encouraged her to call with any questions or concerns.  She verbalized an understanding.

## 2020-05-04 ENCOUNTER — Telehealth: Payer: Self-pay | Admitting: Nurse Practitioner

## 2020-05-06 ENCOUNTER — Other Ambulatory Visit: Payer: Self-pay | Admitting: Hematology

## 2020-05-06 NOTE — Progress Notes (Signed)
START OFF PATHWAY REGIMEN - Colorectal   [id number]:mFOLFIRINOX (Leucovorin IV D1 + Fluorouracil CIV D1,2 + Irinotecan IV D1 + Oxaliplatin IV D1) q14 Days:   A cycle is every 14 days:     Oxaliplatin      Leucovorin      Irinotecan      Fluorouracil   **Always confirm dose/schedule in your pharmacy ordering system**  Patient Characteristics: Distant Metastases, Postoperative Treatment for R0 Resection Tumor Location: Colon Therapeutic Status: Distant Metastases  Intent of Therapy: Curative Intent, Discussed with Patient

## 2020-05-07 ENCOUNTER — Inpatient Hospital Stay: Payer: BC Managed Care – PPO | Admitting: Nutrition

## 2020-05-07 ENCOUNTER — Telehealth: Payer: Self-pay | Admitting: Nutrition

## 2020-05-07 ENCOUNTER — Inpatient Hospital Stay: Payer: BC Managed Care – PPO

## 2020-05-07 NOTE — Progress Notes (Signed)
See telephone note.

## 2020-05-07 NOTE — Telephone Encounter (Signed)
Contacted patient by telephone for nutrition appointment.  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  Patient reports she has some nausea but it is improved with nausea medicine.  She denies problems with healing.  States she is eating fine.  She is really not interested in discussing nutrition at this time.  I offered that patient could reach out to me when her treatment begins if she is having any nutrition issues.  I have not scheduled a follow-up at this time.

## 2020-05-08 ENCOUNTER — Other Ambulatory Visit: Payer: Self-pay | Admitting: *Deleted

## 2020-05-08 MED ORDER — ONDANSETRON 4 MG PO TBDP: 4.0000 mg | ORAL_TABLET | Freq: Three times a day (TID) | ORAL | 0 refills | Status: DC | PRN

## 2020-05-09 ENCOUNTER — Telehealth: Payer: Self-pay

## 2020-05-09 NOTE — Telephone Encounter (Signed)
error 

## 2020-05-11 ENCOUNTER — Inpatient Hospital Stay: Payer: BC Managed Care – PPO

## 2020-05-11 ENCOUNTER — Other Ambulatory Visit: Payer: Self-pay

## 2020-05-11 ENCOUNTER — Encounter: Payer: Self-pay | Admitting: Nurse Practitioner

## 2020-05-11 VITALS — BP 108/77 | HR 66 | Temp 98.8°F | Resp 17

## 2020-05-11 DIAGNOSIS — C19 Malignant neoplasm of rectosigmoid junction: Secondary | ICD-10-CM | POA: Diagnosis not present

## 2020-05-11 DIAGNOSIS — Z95828 Presence of other vascular implants and grafts: Secondary | ICD-10-CM

## 2020-05-11 MED ORDER — SODIUM CHLORIDE 0.9% FLUSH
10.0000 mL | Freq: Once | INTRAVENOUS | Status: AC
  Administered 2020-05-11: 10 mL
  Filled 2020-05-11: qty 10

## 2020-05-11 MED ORDER — SODIUM CHLORIDE 0.9 % IV SOLN
510.0000 mg | Freq: Once | INTRAVENOUS | Status: AC
  Administered 2020-05-11: 510 mg via INTRAVENOUS
  Filled 2020-05-11: qty 510

## 2020-05-11 MED ORDER — HEPARIN SOD (PORK) LOCK FLUSH 100 UNIT/ML IV SOLN
500.0000 [IU] | Freq: Once | INTRAVENOUS | Status: AC
  Administered 2020-05-11: 500 [IU]
  Filled 2020-05-11: qty 5

## 2020-05-11 NOTE — Progress Notes (Signed)
Met with patient at registration to introduce myself as Financial Resource Specialist and to offer available resources.  Discussed one-time $1000 [employer] grant and qualifications to assist with personal expenses while going through treatment.  Gave her my card if interested in applying and for any additional financial questions or concerns.  

## 2020-05-11 NOTE — Patient Instructions (Signed)

## 2020-05-14 ENCOUNTER — Inpatient Hospital Stay (HOSPITAL_BASED_OUTPATIENT_CLINIC_OR_DEPARTMENT_OTHER): Payer: BC Managed Care – PPO | Admitting: Nurse Practitioner

## 2020-05-14 ENCOUNTER — Encounter: Payer: Self-pay | Admitting: Nurse Practitioner

## 2020-05-14 ENCOUNTER — Inpatient Hospital Stay: Payer: BC Managed Care – PPO

## 2020-05-14 ENCOUNTER — Other Ambulatory Visit: Payer: Self-pay

## 2020-05-14 VITALS — BP 112/39 | HR 77 | Temp 99.8°F | Resp 17 | Ht 69.0 in | Wt 158.3 lb

## 2020-05-14 DIAGNOSIS — C19 Malignant neoplasm of rectosigmoid junction: Secondary | ICD-10-CM

## 2020-05-14 MED ORDER — ONDANSETRON HCL 8 MG PO TABS: 8.0000 mg | ORAL_TABLET | Freq: Two times a day (BID) | ORAL | 1 refills | Status: DC | PRN

## 2020-05-14 MED ORDER — LIDOCAINE-PRILOCAINE 2.5-2.5 % EX CREA: TOPICAL_CREAM | CUTANEOUS | 3 refills | Status: DC

## 2020-05-14 MED ORDER — PROCHLORPERAZINE MALEATE 10 MG PO TABS: 10.0000 mg | ORAL_TABLET | Freq: Four times a day (QID) | ORAL | 1 refills | Status: DC | PRN

## 2020-05-14 NOTE — Progress Notes (Signed)
Stephanie [name]   [phone] [phone]   Clinic Follow up Note   Patient Care Team: Patient, [name] as PCP - General (General Practice) [name], RN as Oncology Nurse Navigator [name], NP as Nurse Practitioner (Nurse Practitioner) [name], MD as Consulting Physician (Oncology) 05/14/2020  CHIEF COMPLAINT: Follow-up metastatic colon cancer  SUMMARY OF ONCOLOGIC HISTORY: Oncology History Overview Note  Cancer Staging Malignant neoplasm of rectosigmoid junction [facility]) Staging form: Colon and Rectum, AJCC 8th Edition - Pathologic stage from 04/04/2020: pT4a, pN1b, cM1 - Signed by [name], NP on 05/07/2020    Malignant neoplasm of rectosigmoid junction ([place])  03/12/2020 Initial Diagnosis   Malignant neoplasm of rectosigmoid junction ([place])   03/12/2020 Imaging   CT AP with contrast IMPRESSION: 1. Overall findings are highly concerning for colorectal carcinoma involving the sigmoid colon with an associated perforation and adjacent abscess and phlegmon formation as detailed above. Currently, no collection is amenable to percutaneous drainage given their small size and location. 2. New 2 cm mass in the right hepatic lobe concerning for metastatic disease to the liver until proven otherwise. 3. Enlarged regional lymph nodes as detailed above is concerning for nodal metastatic disease. 4. Large stool burden. 5. Prominent pelvic veins which can be seen in patients with pelvic congestion syndrome.   03/13/2020 Imaging   ABD US IMPRESSION: Approximately 2.1 x 2.4 x 2.0 cm lobular homogeneously echogenic lesion in the right hepatic dome corresponds with the abnormality seen on the prior CT scan. Sonographically, this appearance is highly suggestive of a benign hemangioma.   Recommend MRI of the abdomen with gadolinium contrast which may provide a noninvasive diagnosis of benign hemangioma.    03/13/2020 Imaging   MR ABD W/WO  CONTRAST Hepatobiliary: Diffuse low signal intensity throughout the hepatic parenchyma on T2 weighted images, presumably a consequence of recent Feraheme injection. In segment 7 of the liver (axial image 8 of series 5) there is a 2.5 x 1.9 cm well-defined lesion which is slightly T2 hyperintense. This lesion appears hyperintense on pre gadolinium T1 weighted images (likely a consequence of Feraheme). Interpretation of enhancement within the lesion is compromised by presence of Feraheme. No other hepatic lesions are confidently identified on today's examination. No intra or extrahepatic biliary ductal dilatation. Gallbladder is normal in appearance.   03/31/2020 Imaging   CT AP W contrast IMPRESSION: 1. Previously noted sigmoid colon mass appears increased in size, and again appears to be associated with a focal contained perforation which crosses the midline and has fistulized into the left adnexal region where there is now what appears to be a large left tubo-ovarian abscess, as detailed above. This is also associated with multiple enlarged lymph nodes in the pelvis measuring up to 1.2 cm in short axis and borderline enlarged retroperitoneal lymph nodes, concerning for metastatic disease. In addition, previously suspected metastatic lesion in segment 7 of the liver has enlarged. 2. Small volume of ascites. 3. Additional incidental findings, as above.   04/04/2020 Cancer Staging   Staging form: Colon and Rectum, AJCC 8th Edition - Pathologic stage from 04/04/2020: pT4a, pN1b, cM1 - Signed by [name], NP on 05/07/2020   04/04/2020 Procedure   Paracentesis, path showed no malignant cells (mixed acute and chronic inflammation present)   04/04/2020 Surgery   Open sigmoid colectomy and end colostomy by Dr. [name]   04/04/2020 Pathology Results   FINAL MICROSCOPIC DIAGNOSIS: A. COLON, RECTOSIGMOID, RESECTION: - Invasive moderately differentiated  adenocarcinoma, 6 cm, involving  rectosigmoid junction - Carcinoma invades into serosal surface with perforation and associated serositis - Radial resection margin is positive for carcinoma; proximal and distal margins are not involved - Lymphovascular invasion is present - Metastatic carcinoma to one of fifteen lymph nodes (1/15); one tumor deposit - See oncology table B. LYMPH NODES, MESENTERIC, RESECTION: - Metastatic adenocarcinoma to one of six lymph nodes (1/6) - One tumor deposit  Addendum to note 2 involved lymph nodes (of 21 examined nodes) pT4a,pN1b MMR-normal, preserved expression of MLH1, MSH2, MSH6, PMS2   04/13/2020 Imaging   CT chest without contrast IMPRESSION: Interval development of bilateral pleural effusions, left slightly greater than right, with resultant bibasilar atelectasis including subtotal collapse of the left lower lobe. No evidence of intrathoracic metastatic disease, though evaluation of the collapsed parenchyma is limited. Hepatic metastasis again demonstrated.     04/16/2020 Pathology Results   FINAL MICROSCOPIC DIAGNOSIS:  A. LIVER, RIGHT LOBE, BIOPSY:  - Adenocarcinoma.  COMMENT:  The morphology is compatible with the provided clinical history of colorectal carcinoma.    05/23/2020 -  Chemotherapy   PENDING FOLFOXIRI q2 weeks       CURRENT THERAPY: Pending FOLFOXIRI every 2 weeks, starting week of 8/23  INTERVAL HISTORY: Stephanie [name] returns for follow-up as scheduled.  Her abdominal wound continues to heal.  She was seen by Dr. Barry Dienes last week who cleared her to start chemo.  She has some abdominal discomfort she attributes to recently starting her period and having constipation, otherwise pain fluctuates.  She ran out of MiraLAX, last BM 1 or 2 days ago.  Denies nausea or vomiting.  Her energy and appetite have improved, she is 75-100% back to her activity level.  Denies recent fever, chills, cough, chest pain, dyspnea, leg edema, or other new concerns.   MEDICAL HISTORY:   Past Medical History:  Diagnosis Date  . BV (bacterial vaginosis)   . Lactose intolerance 03/12/2020  . UTI (lower urinary tract infection)     SURGICAL HISTORY: Past Surgical History:  Procedure Laterality Date  . CESAREAN SECTION    . CYSTOSCOPY WITH STENT PLACEMENT  04/04/2020   Procedure: CYSTOSCOPY WITH STENT PLACEMENT;  Surgeon: [name], MD;  Location: [facility];  Service: General;;  . LAPAROTOMY N/A 04/04/2020   Procedure: EXPLORATORY LAPAROTOMY;  Surgeon: [name], MD;  Location: [facility];  Service: General;  Laterality: N/A;  . PORTACATH PLACEMENT Right 04/12/2020   Procedure: INSERTION PORT-A-CATH WITH ULTRASOUND;  Surgeon: Kieth Brightly Arta Bruce, MD;  Location: [facility];  Service: General;  Laterality: Right;    I have reviewed the social history and family history with the patient and they are unchanged from previous note.  ALLERGIES:  has No Known Allergies.  MEDICATIONS:  Current Outpatient Medications  Medication Sig Dispense Refill  . diclofenac Sodium (VOLTAREN) 1 % GEL Apply 2 g topically 4 (four) times daily. 50 g 0  . famotidine (PEPCID) 20 MG tablet Take 1 tablet (20 mg total) by mouth 2 (two) times daily. 60 tablet 0  . ferrous sulfate 325 (65 FE) MG tablet Take 1 tablet (325 mg total) by mouth 2 (two) times daily with a meal. 60 tablet 0  . methocarbamol (ROBAXIN) 500 MG tablet Take 2 tablets (1,000 mg total) by mouth every 6 (six) hours as needed for muscle spasms. 240 tablet 0  . ondansetron (ZOFRAN ODT) 4 MG disintegrating tablet Take 1 tablet (4 mg total) by mouth every 8 (eight) hours as needed  for nausea or vomiting. 30 tablet 0  . PARAGARD INTRAUTERINE COPPER IU 1 Device by Intrauterine route once.     . polyethylene glycol (MIRALAX / GLYCOLAX) 17 g packet Take 17 g by mouth 2 (two) times daily. 14 each 0  . senna-docusate (SENOKOT-S) 8.6-50 MG tablet Take 2 tablets by mouth 2 (two) times daily. 120 tablet 0  . traMADol (ULTRAM) 50 MG tablet Take 1  tablet (50 mg total) by mouth every 12 (twelve) hours as needed for severe pain. 10 tablet 0   No current facility-administered medications for this visit.    PHYSICAL EXAMINATION: ECOG PERFORMANCE STATUS: 1 - Symptomatic but completely ambulatory  Vitals:   05/14/20 1532  BP: (!) 112/39  Pulse: 77  Resp: 17  Temp: 99.8 F (37.7 C)  SpO2: 100%   Filed Weights   05/14/20 1532  Weight: 158 lb 4.8 oz (71.8 kg)    GENERAL:alert, no distress and comfortable SKIN: no rash  EYES: sclera clear LUNGS:  normal breathing effort HEART:  no lower extremity edema ABDOMEN:abdomen soft, non-tender and normal bowel sounds.  Left side colostomy with brown stool in collection bag. Midline abdominal incision healing well, opening is more narrow, no drainage NEURO: alert & oriented x 3 with fluent speech PAC without erythema      LABORATORY DATA:  I have reviewed the data as listed CBC Latest Ref Rng & Units 05/03/2020 04/16/2020 04/15/2020  WBC 4.0 - 10.5 K/uL 6.3 12.2(H) 10.4  Hemoglobin 12.0 - 15.0 g/dL 11.5(L) 10.2(L) 9.6(L)  Hematocrit 36 - 46 % 36.3 32.8(L) 30.5(L)  Platelets 150 - 400 K/uL 212 422(H) 412(H)     CMP Latest Ref Rng & Units 05/03/2020 04/16/2020 04/15/2020  Glucose 70 - 99 mg/dL 108(H) 90 86  BUN 6 - 20 mg/dL 8 5(L) <5(L)  Creatinine 0.44 - 1.00 mg/dL 0.70 0.48 0.45  Sodium 135 - 145 mmol/L 139 137 140  Potassium 3.5 - 5.1 mmol/L 3.5 3.7 3.8  Chloride 98 - 111 mmol/L 106 99 100  CO2 22 - 32 mmol/L _0 Calcium 8.9 - 10.3 mg/dL 9.2 8.8(L) 8.6(L)  Total Protein 6.5 - 8.1 g/dL 7.1 6.4(L) 6.1(L)  Total Bilirubin 0.3 - 1.2 mg/dL 0.2(L) 0.5 0.5  Alkaline Phos 38 - 126 U/L 64 51 47  AST 15 - 41 U/L 12(L) 16 17  ALT 0 - 44 U/L _1 RADIOGRAPHIC STUDIES: I have personally reviewed the radiological images as listed and agreed with the findings in the report. No results found.   ASSESSMENT & PLAN: 39 year old female without significant past medical  history  1.  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 Barry Dienes on 04/04/20 she was found to have perforation and positive radial margin.  Liver biopsy on 04/16/2020 confirmed metastatic disease from her colon cancer -We again reviewed this is stage IV colon cancer s/p surgical resection of the primary tumor with oligo liver metastasis, overall the disease burden appears to be low.  We discussed in this case when the patient is young and fit we treat aggressively to potentially cure her disease which is achievable and approximately 20% of patients with multimodality therapies. -Dr. Burr Medico is recommending systemic treatment with FOLFOXIRI q2 weeks for 3-6 months. If she has dramatic response with limited residual  metastatic disease and no other distant metastasis, she may be a candidate for liver resection or targeted liver therapy such as ablation -Foundation One which shows K-ras mutation, she is not a candidate for EGFR inhibitor.  We will likely add Avastin once her wound completely heals.  -recovered well from surgery, wound healing appropriately. Plan to start chemo week of 05/21/20  2.  Intrabdominal abscess secondary to sigmoid colon perforation -Culture showed staph epidermidis, treated with cefepime and fagyl  3.  Anemia, secondary to #1 and iron deficiency -She had mild intermittent blood in her stool for the past several months, attributed to hemorrhoids -S/p IV Feraheme in 02/2020 during hospitalization, Ferritin improved to 124 -Denies recurrent bleeding lately -Not currently on oral iron, Hgb improved to 11.5, she received second dose Feraheme on 05/11/20  Disposition:   Stephanie [name] appears improved.  Midline surgical wound is healing well.  My nurse performed a dressing change.   Performance status has improved.  The plan is to begin chemotherapy next week.  She will follow-up with Dr. Burr Medico prior to treatment to determine the regimen/dose for cycle 1.  She will attend chemo education session today.   For constipation, I recommend to continue stool softener and resume miralax 1-2 times daily. She will start.  The plan was reviewed with Dr. Burr Medico.    Orders Placed This Encounter  Procedures  . Pregnancy, urine    Standing Status:   Standing    Number of Occurrences:   20    Standing Expiration Date:   05/14/2021   All questions were answered. The patient knows to call the clinic with any problems, questions or concerns. No barriers to learning were detected.     [name], NP 05/14/20

## 2020-05-14 NOTE — Progress Notes (Signed)
Received voicemail from patient inquiring about the [employer].  Called patient and advised what is needed to apply. She will email to me when it becomes available.  She has my card with my contact information for any additional financial questions or concerns.

## 2020-05-14 NOTE — Addendum Note (Signed)
Addended by: [name] on: 05/14/2020 05:16 PM   Modules accepted: Orders

## 2020-05-15 ENCOUNTER — Telehealth: Payer: Self-pay | Admitting: Nurse Practitioner

## 2020-05-15 NOTE — Telephone Encounter (Signed)
Scheduled per 8/16 los. Pt is aware of appt time and dates.

## 2020-05-17 ENCOUNTER — Telehealth: Payer: Self-pay | Admitting: *Deleted

## 2020-05-17 NOTE — Telephone Encounter (Signed)
Pt called earlier today & states that she is having a period for first time in 2 mo.  She reports pain/cramping & requested pain med.  She is taking tylenol, ibuprofen, tramadol but these are not helping.  Discussed with Dr Burr Medico & she preferred no narcortics  & suggested doing what she is doing & try heat.  Returned call to pt & informed of above.  She expressed understanding.

## 2020-05-17 NOTE — Progress Notes (Signed)
Pharmacist Chemotherapy Monitoring - Initial Assessment    Anticipated start date: 05/23/20  Regimen:  . Are orders appropriate based on the patient's diagnosis, regimen, and cycle? No - msg'd MD to consider changing tx plan to [facility] for colorectal dx. . Does the plan date match the patient's scheduled date? Yes . Is the sequencing of drugs appropriate? No . Are the premedications appropriate for the patient's regimen? Yes . Prior Authorization for treatment is: Approved o If applicable, is the correct biosimilar selected based on the patient's insurance? not applicable  Organ Function and Labs: [name] Are dose adjustments needed based on the patient's renal function, hepatic function, or hematologic function? Yes . Are appropriate labs ordered prior to the start of patient's treatment? Yes . Other organ system assessment, if indicated: women of childbearing potential: pregnancy status  . The following baseline labs, if indicated, have been ordered: N/A  Dose Assessment: . Are the drug doses appropriate? Yes . Are the following correct: o Drug concentrations Yes o IV fluid compatible with drug Yes o Administration routes Yes o Timing of therapy Yes . If applicable, does the patient have documented access for treatment and/or plans for port-a-cath placement? yes . If applicable, have lifetime cumulative doses been properly documented and assessed? not applicable Lifetime Dose Tracking  No doses have been documented on this patient for the following tracked chemicals: Doxorubicin, Epirubicin, Idarubicin, Daunorubicin, Mitoxantrone, Bleomycin, Oxaliplatin, Carboplatin, Liposomal Doxorubicin  o   Toxicity Monitoring/Prevention: . The patient has the following take home antiemetics prescribed: Ondansetron and Prochlorperazine . The patient has the following take home medications prescribed: N/A . Medication allergies and previous infusion related reactions, if applicable, have been  reviewed and addressed. Yes . The patient's current medication list has been assessed for drug-drug interactions with their chemotherapy regimen. no significant drug-drug interactions were identified on review.  Order Review: . Are the treatment plan orders signed? No . Is the patient scheduled to see a provider prior to their treatment? Yes  I verify that I have reviewed each item in the above checklist and answered each question accordingly.   Kennith Center, Pharm.D., CPP 05/17/2020@5 :45 PM

## 2020-05-18 ENCOUNTER — Other Ambulatory Visit: Payer: Self-pay | Admitting: Hematology

## 2020-05-18 ENCOUNTER — Telehealth: Payer: Self-pay

## 2020-05-18 MED ORDER — TRAMADOL HCL 50 MG PO TABS: 50.0000 mg | ORAL_TABLET | Freq: Two times a day (BID) | ORAL | 0 refills | Status: DC | PRN

## 2020-05-18 NOTE — Telephone Encounter (Signed)
Ms Hermans called requesting refill for her tramadol. Forwarded to Dr. Burr Medico

## 2020-05-18 NOTE — Progress Notes (Signed)
Plan switched to [facility] at MD request.  Kennith Center, Pharm.D., CPP 05/18/2020@11 :44 AM

## 2020-05-21 NOTE — Progress Notes (Signed)
Stephanie [name]   [phone] [phone]) [phone]   Clinic Follow up Note   Patient Care Team: Patient, [name] as PCP - General (General Practice) [name], RN as Oncology Nurse Navigator [name], NP as Nurse Practitioner (Nurse Practitioner) [name], MD as Consulting Physician (Oncology)  Date of Service:  05/23/2020  CHIEF COMPLAINT: f/u of metastatic colon cancer   SUMMARY OF ONCOLOGIC HISTORY: Oncology History Overview Note  Cancer Staging Malignant neoplasm of rectosigmoid junction [facility]) Staging form: Colon and Rectum, AJCC 8th Edition - Pathologic stage from 04/04/2020: pT4a, pN1b, cM1 - Signed by [name], NP on 05/07/2020    Malignant neoplasm of rectosigmoid junction ([place])  03/12/2020 Initial Diagnosis   Malignant neoplasm of rectosigmoid junction ([place])   03/12/2020 Imaging   CT AP with contrast IMPRESSION: 1. Overall findings are highly concerning for colorectal carcinoma involving the sigmoid colon with an associated perforation and adjacent abscess and phlegmon formation as detailed above. Currently, no collection is amenable to percutaneous drainage given their small size and location. 2. New 2 cm mass in the right hepatic lobe concerning for metastatic disease to the liver until proven otherwise. 3. Enlarged regional lymph nodes as detailed above is concerning for nodal metastatic disease. 4. Large stool burden. 5. Prominent pelvic veins which can be seen in patients with pelvic congestion syndrome.   03/13/2020 Imaging   ABD US IMPRESSION: Approximately 2.1 x 2.4 x 2.0 cm lobular homogeneously echogenic lesion in the right hepatic dome corresponds with the abnormality seen on the prior CT scan. Sonographically, this appearance is highly suggestive of a benign hemangioma.   Recommend MRI of the abdomen with gadolinium contrast which may provide a noninvasive diagnosis of benign hemangioma.    03/13/2020  Imaging   MR ABD W/WO CONTRAST Hepatobiliary: Diffuse low signal intensity throughout the hepatic parenchyma on T2 weighted images, presumably a consequence of recent Feraheme injection. In segment 7 of the liver (axial image 8 of series 5) there is a 2.5 x 1.9 cm well-defined lesion which is slightly T2 hyperintense. This lesion appears hyperintense on pre gadolinium T1 weighted images (likely a consequence of Feraheme). Interpretation of enhancement within the lesion is compromised by presence of Feraheme. No other hepatic lesions are confidently identified on today's examination. No intra or extrahepatic biliary ductal dilatation. Gallbladder is normal in appearance.   03/31/2020 Imaging   CT AP W contrast IMPRESSION: 1. Previously noted sigmoid colon mass appears increased in size, and again appears to be associated with a focal contained perforation which crosses the midline and has fistulized into the left adnexal region where there is now what appears to be a large left tubo-ovarian abscess, as detailed above. This is also associated with multiple enlarged lymph nodes in the pelvis measuring up to 1.2 cm in short axis and borderline enlarged retroperitoneal lymph nodes, concerning for metastatic disease. In addition, previously suspected metastatic lesion in segment 7 of the liver has enlarged. 2. Small volume of ascites. 3. Additional incidental findings, as above.   04/04/2020 Cancer Staging   Staging form: Colon and Rectum, AJCC 8th Edition - Pathologic stage from 04/04/2020: pT4a, pN1b, cM1 - Signed by [name], NP on 05/07/2020   04/04/2020 Procedure   Paracentesis, path showed no malignant cells (mixed acute and chronic inflammation present)   04/04/2020 Surgery   Open sigmoid colectomy and end colostomy by Dr. [name]   04/04/2020 Pathology Results   FINAL MICROSCOPIC DIAGNOSIS: A.  COLON, RECTOSIGMOID, RESECTION: - Invasive moderately differentiated  adenocarcinoma, 6 cm, involving rectosigmoid junction - Carcinoma invades into serosal surface with perforation and associated serositis - Radial resection margin is positive for carcinoma; proximal and distal margins are not involved - Lymphovascular invasion is present - Metastatic carcinoma to one of fifteen lymph nodes (1/15); one tumor deposit - See oncology table B. LYMPH NODES, MESENTERIC, RESECTION: - Metastatic adenocarcinoma to one of six lymph nodes (1/6) - One tumor deposit  Addendum to note 2 involved lymph nodes (of 21 examined nodes) pT4a,pN1b MMR-normal, preserved expression of MLH1, MSH2, MSH6, PMS2   04/12/2020 Procedure   PAC placement    04/13/2020 Imaging   CT chest without contrast IMPRESSION: Interval development of bilateral pleural effusions, left slightly greater than right, with resultant bibasilar atelectasis including subtotal collapse of the left lower lobe. No evidence of intrathoracic metastatic disease, though evaluation of the collapsed parenchyma is limited. Hepatic metastasis again demonstrated.     04/16/2020 Pathology Results   FINAL MICROSCOPIC DIAGNOSIS:  A. LIVER, RIGHT LOBE, BIOPSY:  - Adenocarcinoma.  COMMENT:  The morphology is compatible with the provided clinical history of colorectal carcinoma.    05/23/2020 -  Chemotherapy   FOLFIRINOX q2weeks for 3-6 months starting 05/23/20   06/06/2020 -  Chemotherapy   The patient had bevacizumab-bvzr (ZIRABEV) 400 mg in sodium chloride 0.9 % 100 mL chemo infusion, 5 mg/kg = 400 mg, Intravenous,  Once, 0 of 6 cycles  for chemotherapy treatment.       CURRENT THERAPY:  FOLFIRINOX q2weeks for 3-6 months starting 05/23/20  INTERVAL HISTORY:  Stephanie [name] is here for a follow up and treatment. She presents to the clinic alone. She notes she is getting stronger. She notes she has gained 10 pounds in 2 weeks. She notes her energy is 80% back to normal. She notes she still uses dressing over  her surgical incision. She last saw Dr Barry Dienes 2-3 weeks ago. She has been able to return to working from home 4 hours a day. She notes she uses Miralax and stool softener if she has constipation or very hard stool output. She notes she only has 2 rings left for her ostomy and her shipment is still pending. She requested Rings from our clinic if we have it. She notes she received her 2nd COVDI19 vaccine last month. She had body pain with 2nd dose. She notes she can get help from her mom and her daughter.    REVIEW OF SYSTEMS:   Constitutional: Denies fevers, chills or abnormal weight loss Eyes: Denies blurriness of vision Ears, nose, mouth, throat, and face: Denies mucositis or sore throat Respiratory: Denies cough, dyspnea or wheezes Cardiovascular: Denies palpitation, chest discomfort or lower extremity swelling Gastrointestinal:  Denies nausea, heartburn or change in bowel habits Skin: Denies abnormal skin rashes Lymphatics: Denies new lymphadenopathy or easy bruising Neurological:Denies numbness, tingling or new weaknesses Behavioral/Psych: Mood is stable, no new changes  All other systems were reviewed with the patient and are negative.  MEDICAL HISTORY:  Past Medical History:  Diagnosis Date  . BV (bacterial vaginosis)   . Lactose intolerance 03/12/2020  . UTI (lower urinary tract infection)     SURGICAL HISTORY: Past Surgical History:  Procedure Laterality Date  . CESAREAN SECTION    . CYSTOSCOPY WITH STENT PLACEMENT  04/04/2020   Procedure: CYSTOSCOPY WITH STENT PLACEMENT;  Surgeon: [name], MD;  Location: [facility];  Service: General;;  . LAPAROTOMY N/A 04/04/2020   Procedure: EXPLORATORY  LAPAROTOMY;  Surgeon: [name], MD;  Location: [facility];  Service: General;  Laterality: N/A;  . PORTACATH PLACEMENT Right 04/12/2020   Procedure: INSERTION PORT-A-CATH WITH ULTRASOUND;  Surgeon: Kieth Brightly Arta Bruce, MD;  Location: [facility];  Service: General;  Laterality: Right;    I  have reviewed the social history and family history with the patient and they are unchanged from previous note.  ALLERGIES:  has No Known Allergies.  MEDICATIONS:  Current Outpatient Medications  Medication Sig Dispense Refill  . diclofenac Sodium (VOLTAREN) 1 % GEL Apply 2 g topically 4 (four) times daily. 50 g 0  . famotidine (PEPCID) 20 MG tablet Take 1 tablet (20 mg total) by mouth 2 (two) times daily. 60 tablet 0  . ferrous sulfate 325 (65 FE) MG tablet Take 1 tablet (325 mg total) by mouth 2 (two) times daily with a meal. 60 tablet 0  . lidocaine-prilocaine (EMLA) cream Apply to affected area once 30 g 3  . ondansetron (ZOFRAN ODT) 4 MG disintegrating tablet Take 1 tablet (4 mg total) by mouth every 8 (eight) hours as needed for nausea or vomiting. 30 tablet 0  . ondansetron (ZOFRAN) 8 MG tablet Take 1 tablet (8 mg total) by mouth 2 (two) times daily as needed. Start on day 3 after chemotherapy. 30 tablet 1  . PARAGARD INTRAUTERINE COPPER IU 1 Device by Intrauterine route once.     . polyethylene glycol (MIRALAX / GLYCOLAX) 17 g packet Take 17 g by mouth 2 (two) times daily. 14 each 0  . prochlorperazine (COMPAZINE) 10 MG tablet Take 1 tablet (10 mg total) by mouth every 6 (six) hours as needed (Nausea or vomiting). 30 tablet 1  . senna-docusate (SENOKOT-S) 8.6-50 MG tablet Take 2 tablets by mouth 2 (two) times daily. 120 tablet 0  . traMADol (ULTRAM) 50 MG tablet Take 1 tablet (50 mg total) by mouth every 12 (twelve) hours as needed for severe pain. 30 tablet 0   No current facility-administered medications for this visit.   Facility-Administered Medications Ordered in Other Visits  Medication Dose Route Frequency Provider Last Rate Last Admin  . fluorouracil (ADRUCIL) 4,500 mg in sodium chloride 0.9 % 60 mL chemo infusion  2,400 mg/m2 (Treatment Plan Recorded) Intravenous 1 day or 1 dose [name], MD   4,500 mg at 05/23/20 1605    PHYSICAL EXAMINATION: ECOG PERFORMANCE STATUS: 1 -  Symptomatic but completely ambulatory  Vitals:   05/23/20 1009  BP: 117/84  Pulse: 73  Resp: 18  Temp: 98.5 F (36.9 C)  SpO2: 99%   Filed Weights   05/23/20 1009  Weight: 161 lb 6.4 oz (73.2 kg)    GENERAL:alert, no distress and comfortable SKIN: skin color, texture, turgor are normal, no rashes or significant lesions EYES: normal, Conjunctiva are pink and non-injected, sclera clear  NECK: supple, thyroid normal size, non-tender, without nodularity LYMPH:  no palpable lymphadenopathy in the cervical, axillary  LUNGS: clear to auscultation and percussion with normal breathing effort HEART: regular rate & rhythm and no murmurs and no lower extremity edema ABDOMEN:abdomen soft, non-tender and normal bowel sounds (+) Ostomy bag in place, clean (+) Surgical incision healed well overall Musculoskeletal:no cyanosis of digits and no clubbing  NEURO: alert & oriented x 3 with fluent speech, no focal motor/sensory deficits  LABORATORY DATA:  I have reviewed the data as listed CBC Latest Ref Rng & Units 05/23/2020 05/03/2020 04/16/2020  WBC 4.0 - 10.5 K/uL 4.8 6.3 12.2(H)  Hemoglobin 12.0 -  15.0 g/dL 12.0 11.5(L) 10.2(L)  Hematocrit 36 - 46 % 37.4 36.3 32.8(L)  Platelets 150 - 400 K/uL 207 212 422(H)     CMP Latest Ref Rng & Units 05/23/2020 05/03/2020 04/16/2020  Glucose 70 - 99 mg/dL 81 108(H) 90  BUN 6 - 20 mg/dL 8 8 5(L)  Creatinine 0.44 - 1.00 mg/dL 0.65 0.70 0.48  Sodium 135 - 145 mmol/L 139 139 137  Potassium 3.5 - 5.1 mmol/L 3.8 3.5 3.7  Chloride 98 - 111 mmol/L 104 106 99  CO2 22 - 32 mmol/L _0 Calcium 8.9 - 10.3 mg/dL 9.3 9.2 8.8(L)  Total Protein 6.5 - 8.1 g/dL 6.8 7.1 6.4(L)  Total Bilirubin 0.3 - 1.2 mg/dL <0.2(L) 0.2(L) 0.5  Alkaline Phos 38 - 126 U/L 58 64 51  AST 15 - 41 U/L 13(L) 12(L) 16  ALT 0 - 44 U/L _1 RADIOGRAPHIC STUDIES: I have personally reviewed the radiological images as listed and agreed with the findings in the report. No results  found.   ASSESSMENT & PLAN:  Stephanie [name] is a 39 y.o. female with    1. 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 Barry Dienes on 04/04/20 she was found to have perforation and positive radial margin. Liver biopsy on 04/16/2020 confirmed metastatic disease from her colon cancer -Work up is consistent with stage IV colon cancer s/p surgical resection of the primary tumor with oligo liver metastasis, overall the disease burden appears to be low. Given she is young and fit we treat aggressively to potentially cure her disease which is achievable and approximately 20% of patients with multimodality therapies. -I recommend first line chemo with FOLFOXIRI q2 weeks for 3-6 months. Plan to start today (05/23/20). -If she has dramatic response with limited residual metastatic disease and no other distant metastasis, she may be a candidate for liver resection or targeted liver therapy such as ablation.  -Her FO was K-ras mutation (+), she is not a candidate for EGFR inhibitor. Will add Avastin with C2. I reviewed side effects with her such as small risk of bleeding, thrombosis, bowel perforation, proteinuria and elevated BP etc. she agrees to proceed. -Since hospitalization she has recovered well. She has gained weight, energy much improved and her surgical wound overall healed now.  -Labs reviewed and adequate to proceed with C1 FOLFIRINOX today at decreased dose. I reviewed watching for diarrhea and cold sensitivity. She can use Claritin after her GCSF injection to combat bone pain.  -I encouraged her to continue clinic if she has sign of dehydration, significant weight loss, severe or unexpected symptoms or signs of infection. I answered all her questions to her understanding and satisfaction.  -Phone visit  for toxicity check next week with NP [name]. F/u in 2 weeks  -She has received both her COVID19 vaccines. She can receive booster injection as well.    2. Open midline surgical wound  -She has open midline incision, she continues wet to dry packing with dressing changes.   -Pain and PS are improving. She was given tramadol 10 tabs for pain exacerbations in hopes this will help her become more active with better pain management (05/03/20) -Surgical wound overall healed on exam today (05/23/20)   3.  Anemia, secondary to #1 and iron deficiency -She  had mild intermittent blood in her stool for the past several months, attributed to hemorrhoids -S/p IV Feraheme in 02/2020 during hospitalization and again on 05/11/20.  -Denies recurrent bleeding lately and not currently on oral iron -No anemia currently (05/23/20)   Plan: -Labs reviewed and adequate to proceed with C1 FOLFIRINOX today at decreased dose of irinotecan and 5-FU, Udenyca on day 3 -Phone visit for toxicity check next week with NP [name]. -Lab, flush, f/u and FOLFIRINOX in 2, 4, 6 weeks, plan to increase irinotecan and 5-fu dose on next cycle if She tolerates well. -will start bevacizumab with cycle 2   No problem-specific Assessment & Plan notes found for this encounter.   No orders of the defined types were placed in this encounter.  All questions were answered. The patient knows to call the clinic with any problems, questions or concerns. No barriers to learning was detected. The total time spent in the appointment was 30 minutes.     [name], MD 05/23/2020   I, Joslyn Devon, am acting as scribe for [name], MD.   I have reviewed the above documentation for accuracy and completeness, and I agree with the above.

## 2020-05-23 ENCOUNTER — Encounter: Payer: Self-pay | Admitting: Hematology

## 2020-05-23 ENCOUNTER — Other Ambulatory Visit: Payer: Self-pay

## 2020-05-23 ENCOUNTER — Inpatient Hospital Stay: Payer: BC Managed Care – PPO

## 2020-05-23 ENCOUNTER — Inpatient Hospital Stay: Payer: BC Managed Care – PPO | Admitting: Hematology

## 2020-05-23 VITALS — BP 117/84 | HR 73 | Temp 98.5°F | Resp 18 | Ht 69.0 in | Wt 161.4 lb

## 2020-05-23 DIAGNOSIS — C19 Malignant neoplasm of rectosigmoid junction: Secondary | ICD-10-CM | POA: Diagnosis not present

## 2020-05-23 DIAGNOSIS — Z95828 Presence of other vascular implants and grafts: Secondary | ICD-10-CM

## 2020-05-23 LAB — CMP (CANCER CENTER ONLY)
ALT: 9 U/L (ref 0–44)
AST: 13 U/L — ABNORMAL LOW (ref 15–41)
Albumin: 3.3 g/dL — ABNORMAL LOW (ref 3.5–5.0)
Alkaline Phosphatase: 58 U/L (ref 38–126)
Anion gap: 8 (ref 5–15)
BUN: 8 mg/dL (ref 6–20)
CO2: 27 mmol/L (ref 22–32)
Calcium: 9.3 mg/dL (ref 8.9–10.3)
Chloride: 104 mmol/L (ref 98–111)
Creatinine: 0.65 mg/dL (ref 0.44–1.00)
GFR, Est AFR Am: >60 mL/min (ref >60)
GFR, Estimated: >60 mL/min (ref >60)
Glucose, Bld: 81 mg/dL (ref 70–99)
Potassium: 3.8 mmol/L (ref 3.5–5.1)
Sodium: 139 mmol/L (ref 135–145)
Total Bilirubin: <0.2 mg/dL — ABNORMAL LOW (ref 0.3–1.2)
Total Protein: 6.8 g/dL (ref 6.5–8.1)

## 2020-05-23 LAB — CBC WITH DIFFERENTIAL (CANCER CENTER ONLY)
Abs Immature Granulocytes: 0.01 10*3/uL (ref 0.00–0.07)
Basophils Absolute: 0 10*3/uL (ref 0.0–0.1)
Basophils Relative: 0 %
Eosinophils Absolute: 0.1 10*3/uL (ref 0.0–0.5)
Eosinophils Relative: 2 %
HCT: 37.4 % (ref 36.0–46.0)
Hemoglobin: 12 g/dL (ref 12.0–15.0)
Immature Granulocytes: 0 %
Lymphocytes Relative: 28 %
Lymphs Abs: 1.3 10*3/uL (ref 0.7–4.0)
MCH: 27.4 pg (ref 26.0–34.0)
MCHC: 32.1 g/dL (ref 30.0–36.0)
MCV: 85.4 fL (ref 80.0–100.0)
Monocytes Absolute: 0.5 10*3/uL (ref 0.1–1.0)
Monocytes Relative: 10 %
Neutro Abs: 2.9 10*3/uL (ref 1.7–7.7)
Neutrophils Relative %: 60 %
Platelet Count: 207 10*3/uL (ref 150–400)
RBC: 4.38 MIL/uL (ref 3.87–5.11)
RDW: 16.3 % — ABNORMAL HIGH (ref 11.5–15.5)
WBC Count: 4.8 10*3/uL (ref 4.0–10.5)
nRBC: 0 % (ref 0.0–0.2)

## 2020-05-23 LAB — PREGNANCY, URINE: Preg Test, Ur: NEGATIVE

## 2020-05-23 LAB — TOTAL PROTEIN, URINE DIPSTICK: Protein, ur: NEGATIVE mg/dL

## 2020-05-23 MED ORDER — PALONOSETRON HCL INJECTION 0.25 MG/5ML
INTRAVENOUS | Status: AC
  Filled 2020-05-23: qty 5

## 2020-05-23 MED ORDER — PALONOSETRON HCL INJECTION 0.25 MG/5ML
0.2500 mg | Freq: Once | INTRAVENOUS | Status: AC
  Administered 2020-05-23: 0.25 mg via INTRAVENOUS

## 2020-05-23 MED ORDER — ATROPINE SULFATE 1 MG/ML IJ SOLN
0.5000 mg | Freq: Once | INTRAMUSCULAR | Status: AC | PRN
  Administered 2020-05-23: 0.5 mg via INTRAVENOUS

## 2020-05-23 MED ORDER — SODIUM CHLORIDE 0.9 % IV SOLN
10.0000 mg | Freq: Once | INTRAVENOUS | Status: AC
  Administered 2020-05-23: 10 mg via INTRAVENOUS
  Filled 2020-05-23: qty 10

## 2020-05-23 MED ORDER — SODIUM CHLORIDE 0.9 % IV SOLN
2400.0000 mg/m2 | INTRAVENOUS | Status: DC
  Administered 2020-05-23: 4500 mg via INTRAVENOUS
  Filled 2020-05-23: qty 90

## 2020-05-23 MED ORDER — LEUCOVORIN CALCIUM INJECTION 350 MG
200.0000 mg/m2 | Freq: Once | INTRAVENOUS | Status: AC
  Administered 2020-05-23: 374 mg via INTRAVENOUS
  Filled 2020-05-23: qty 18.7

## 2020-05-23 MED ORDER — OXALIPLATIN CHEMO INJECTION 100 MG/20ML
85.0000 mg/m2 | Freq: Once | INTRAVENOUS | Status: AC
  Administered 2020-05-23: 160 mg via INTRAVENOUS
  Filled 2020-05-23: qty 32

## 2020-05-23 MED ORDER — SODIUM CHLORIDE 0.9 % IV SOLN
150.0000 mg | Freq: Once | INTRAVENOUS | Status: AC
  Administered 2020-05-23: 150 mg via INTRAVENOUS
  Filled 2020-05-23: qty 150

## 2020-05-23 MED ORDER — ATROPINE SULFATE 1 MG/ML IJ SOLN
INTRAMUSCULAR | Status: AC
  Filled 2020-05-23: qty 1

## 2020-05-23 MED ORDER — SODIUM CHLORIDE 0.9% FLUSH
10.0000 mL | Freq: Once | INTRAVENOUS | Status: AC
  Administered 2020-05-23: 10 mL
  Filled 2020-05-23: qty 10

## 2020-05-23 MED ORDER — DEXTROSE 5 % IV SOLN
Freq: Once | INTRAVENOUS | Status: AC
  Filled 2020-05-23: qty 250

## 2020-05-23 MED ORDER — SODIUM CHLORIDE 0.9 % IV SOLN
150.0000 mg/m2 | Freq: Once | INTRAVENOUS | Status: AC
  Administered 2020-05-23: 280 mg via INTRAVENOUS
  Filled 2020-05-23: qty 14

## 2020-05-23 NOTE — Patient Instructions (Signed)
[facility] Discharge Instructions for Patients Receiving Chemotherapy  Today you received the following chemotherapy agents: Irinotecan, Oxaliplatin, Leucovorin, 5FU  To help prevent nausea and vomiting after your treatment, we encourage you to take your nausea medication as directed.   If you develop nausea and vomiting that is not controlled by your nausea medication, call the clinic.   BELOW ARE SYMPTOMS THAT SHOULD BE REPORTED IMMEDIATELY:  *FEVER GREATER THAN 100.5 F  *CHILLS WITH OR WITHOUT FEVER  NAUSEA AND VOMITING THAT IS NOT CONTROLLED WITH YOUR NAUSEA MEDICATION  *UNUSUAL SHORTNESS OF BREATH  *UNUSUAL BRUISING OR BLEEDING  TENDERNESS IN MOUTH AND THROAT WITH OR WITHOUT PRESENCE OF ULCERS  *URINARY PROBLEMS  *BOWEL PROBLEMS  UNUSUAL RASH Items with * indicate a potential emergency and should be followed up as soon as possible.  Feel free to call the clinic should you have any questions or concerns. The clinic phone number is [phone].  Please show the Kay at check-in to the Emergency Department and triage nurse.  Irinotecan injection What is this medicine? IRINOTECAN (ir in oh TEE kan ) is a chemotherapy drug. It is used to treat colon and rectal cancer. This medicine may be used for other purposes; ask your health care provider or pharmacist if you have questions. COMMON BRAND NAME(S): Camptosar What should I tell my health care provider before I take this medicine? They need to know if you have any of these conditions:  dehydration  diarrhea  infection (especially a virus infection such as chickenpox, cold sores, or herpes)  liver disease  low blood counts, like low white cell, platelet, or red cell counts  low levels of calcium, magnesium, or potassium in the blood  recent or ongoing radiation therapy  an unusual or allergic reaction to irinotecan, other medicines, foods, dyes, or preservatives  pregnant or  trying to get pregnant  breast-feeding How should I use this medicine? This drug is given as an infusion into a vein. It is administered in a hospital or clinic by a specially trained health care professional. Talk to your pediatrician regarding the use of this medicine in children. Special care may be needed. Overdosage: If you think you have taken too much of this medicine contact a poison control center or emergency room at once. NOTE: This medicine is only for you. Do not share this medicine with others. What if I miss a dose? It is important not to miss your dose. Call your doctor or health care professional if you are unable to keep an appointment. What may interact with this medicine? This medicine may interact with the following medications:  antiviral medicines for HIV or AIDS  certain antibiotics like rifampin or rifabutin  certain medicines for fungal infections like itraconazole, ketoconazole, posaconazole, and voriconazole  certain medicines for seizures like carbamazepine, phenobarbital, phenotoin  clarithromycin  gemfibrozil  nefazodone  St. John's Wort This list may not describe all possible interactions. Give your health care provider a list of all the medicines, herbs, non-prescription drugs, or dietary supplements you use. Also tell them if you smoke, drink alcohol, or use illegal drugs. Some items may interact with your medicine. What should I watch for while using this medicine? Your condition will be monitored carefully while you are receiving this medicine. You will need important blood work done while you are taking this medicine. This drug may make you feel generally unwell. This is not uncommon, as chemotherapy can affect healthy cells as well as  cancer cells. Report any side effects. Continue your course of treatment even though you feel ill unless your doctor tells you to stop. In some cases, you may be given additional medicines to help with side effects.  Follow all directions for their use. You may get drowsy or dizzy. Do not drive, use machinery, or do anything that needs mental alertness until you know how this medicine affects you. Do not stand or sit up quickly, especially if you are an older patient. This reduces the risk of dizzy or fainting spells. Call your health care professional for advice if you get a fever, chills, or sore throat, or other symptoms of a cold or flu. Do not treat yourself. This medicine decreases your body's ability to fight infections. Try to avoid being around people who are sick. Avoid taking products that contain aspirin, acetaminophen, ibuprofen, naproxen, or ketoprofen unless instructed by your doctor. These medicines may hide a fever. This medicine may increase your risk to bruise or bleed. Call your doctor or health care professional if you notice any unusual bleeding. Be careful brushing and flossing your teeth or using a toothpick because you may get an infection or bleed more easily. If you have any dental work done, tell your dentist you are receiving this medicine. Do not become pregnant while taking this medicine or for 6 months after stopping it. Women should inform their health care professional if they wish to become pregnant or think they might be pregnant. Men should not father a child while taking this medicine and for 3 months after stopping it. There is potential for serious side effects to an unborn child. Talk to your health care professional for more information. Do not breast-feed an infant while taking this medicine or for 7 days after stopping it. This medicine has caused ovarian failure in some women. This medicine may make it more difficult to get pregnant. Talk to your health care professional if you are concerned about your fertility. This medicine has caused decreased sperm counts in some men. This may make it more difficult to father a child. Talk to your health care professional if you are  concerned about your fertility. What side effects may I notice from receiving this medicine? Side effects that you should report to your doctor or health care professional as soon as possible:  allergic reactions like skin rash, itching or hives, swelling of the face, lips, or tongue  chest pain  diarrhea  flushing, runny nose, sweating during infusion  low blood counts - this medicine may decrease the number of white blood cells, red blood cells and platelets. You may be at increased risk for infections and bleeding.  nausea, vomiting  pain, swelling, warmth in the leg  signs of decreased platelets or bleeding - bruising, pinpoint red spots on the skin, black, tarry stools, blood in the urine  signs of infection - fever or chills, cough, sore throat, pain or difficulty passing urine  signs of decreased red blood cells - unusually weak or tired, fainting spells, lightheadedness Side effects that usually do not require medical attention (report to your doctor or health care professional if they continue or are bothersome):  constipation  hair loss  headache  loss of appetite  mouth sores  stomach pain This list may not describe all possible side effects. Call your doctor for medical advice about side effects. You may report side effects to FDA at 1-800-FDA-1088. Where should I keep my medicine? This drug is given in a  hospital or clinic and will not be stored at home. NOTE: This sheet is a summary. It may not cover all possible information. If you have questions about this medicine, talk to your doctor, pharmacist, or health care provider.  2020 Elsevier/Gold Standard (2018-11-05 10:09:17)  Oxaliplatin Injection What is this medicine? OXALIPLATIN (ox AL i PLA tin) is a chemotherapy drug. It targets fast dividing cells, like cancer cells, and causes these cells to die. This medicine is used to treat cancers of the colon and rectum, and many other cancers. This medicine may  be used for other purposes; ask your health care provider or pharmacist if you have questions. COMMON BRAND NAME(S): Eloxatin What should I tell my health care provider before I take this medicine? They need to know if you have any of these conditions:  heart disease  history of irregular heartbeat  liver disease  low blood counts, like white cells, platelets, or red blood cells  lung or breathing disease, like asthma  take medicines that treat or prevent blood clots  tingling of the fingers or toes, or other nerve disorder  an unusual or allergic reaction to oxaliplatin, other chemotherapy, other medicines, foods, dyes, or preservatives  pregnant or trying to get pregnant  breast-feeding How should I use this medicine? This drug is given as an infusion into a vein. It is administered in a hospital or clinic by a specially trained health care professional. Talk to your pediatrician regarding the use of this medicine in children. Special care may be needed. Overdosage: If you think you have taken too much of this medicine contact a poison control center or emergency room at once. NOTE: This medicine is only for you. Do not share this medicine with others. What if I miss a dose? It is important not to miss a dose. Call your doctor or health care professional if you are unable to keep an appointment. What may interact with this medicine? Do not take this medicine with any of the following medications:  cisapride  dronedarone  pimozide  thioridazine This medicine may also interact with the following medications:  aspirin and aspirin-like medicines  certain medicines that treat or prevent blood clots like warfarin, apixaban, dabigatran, and rivaroxaban  cisplatin  cyclosporine  diuretics  medicines for infection like acyclovir, adefovir, amphotericin B, bacitracin, cidofovir, foscarnet, ganciclovir, gentamicin, pentamidine, vancomycin  NSAIDs, medicines for pain and  inflammation, like ibuprofen or naproxen  other medicines that prolong the QT interval (an abnormal heart rhythm)  pamidronate  zoledronic acid This list may not describe all possible interactions. Give your health care provider a list of all the medicines, herbs, non-prescription drugs, or dietary supplements you use. Also tell them if you smoke, drink alcohol, or use illegal drugs. Some items may interact with your medicine. What should I watch for while using this medicine? Your condition will be monitored carefully while you are receiving this medicine. You may need blood work done while you are taking this medicine. This medicine may make you feel generally unwell. This is not uncommon as chemotherapy can affect healthy cells as well as cancer cells. Report any side effects. Continue your course of treatment even though you feel ill unless your healthcare professional tells you to stop. This medicine can make you more sensitive to cold. Do not drink cold drinks or use ice. Cover exposed skin before coming in contact with cold temperatures or cold objects. When out in cold weather wear warm clothing and cover your mouth and  nose to warm the air that goes into your lungs. Tell your doctor if you get sensitive to the cold. Do not become pregnant while taking this medicine or for 9 months after stopping it. Women should inform their health care professional if they wish to become pregnant or think they might be pregnant. Men should not father a child while taking this medicine and for 6 months after stopping it. There is potential for serious side effects to an unborn child. Talk to your health care professional for more information. Do not breast-feed a child while taking this medicine or for 3 months after stopping it. This medicine has caused ovarian failure in some women. This medicine may make it more difficult to get pregnant. Talk to your health care professional if you are concerned about  your fertility. This medicine has caused decreased sperm counts in some men. This may make it more difficult to father a child. Talk to your health care professional if you are concerned about your fertility. This medicine may increase your risk of getting an infection. Call your health care professional for advice if you get a fever, chills, or sore throat, or other symptoms of a cold or flu. Do not treat yourself. Try to avoid being around people who are sick. Avoid taking medicines that contain aspirin, acetaminophen, ibuprofen, naproxen, or ketoprofen unless instructed by your health care professional. These medicines may hide a fever. Be careful brushing or flossing your teeth or using a toothpick because you may get an infection or bleed more easily. If you have any dental work done, tell your dentist you are receiving this medicine. What side effects may I notice from receiving this medicine? Side effects that you should report to your doctor or health care professional as soon as possible:  allergic reactions like skin rash, itching or hives, swelling of the face, lips, or tongue  breathing problems  cough  low blood counts - this medicine may decrease the number of white blood cells, red blood cells, and platelets. You may be at increased risk for infections and bleeding  nausea, vomiting  pain, redness, or irritation at site where injected  pain, tingling, numbness in the hands or feet  signs and symptoms of bleeding such as bloody or black, tarry stools; red or dark brown urine; spitting up blood or brown material that looks like coffee grounds; red spots on the skin; unusual bruising or bleeding from the eyes, gums, or nose  signs and symptoms of a dangerous change in heartbeat or heart rhythm like chest pain; dizziness; fast, irregular heartbeat; palpitations; feeling faint or lightheaded; falls  signs and symptoms of infection like fever; chills; cough; sore throat; pain or  trouble passing urine  signs and symptoms of liver injury like dark yellow or brown urine; general ill feeling or flu-like symptoms; light-colored stools; loss of appetite; nausea; right upper belly pain; unusually weak or tired; yellowing of the eyes or skin  signs and symptoms of low red blood cells or anemia such as unusually weak or tired; feeling faint or lightheaded; falls  signs and symptoms of muscle injury like dark urine; trouble passing urine or change in the amount of urine; unusually weak or tired; muscle pain; back pain Side effects that usually do not require medical attention (report to your doctor or health care professional if they continue or are bothersome):  changes in taste  diarrhea  gas  hair loss  loss of appetite  mouth sores This list may  not describe all possible side effects. Call your doctor for medical advice about side effects. You may report side effects to FDA at 1-800-FDA-1088. Where should I keep my medicine? This drug is given in a hospital or clinic and will not be stored at home. NOTE: This sheet is a summary. It may not cover all possible information. If you have questions about this medicine, talk to your doctor, pharmacist, or health care provider.  2020 Elsevier/Gold Standard (2019-02-02 12:20:35)  Leucovorin injection What is this medicine? LEUCOVORIN (loo koe VOR in) is used to prevent or treat the harmful effects of some medicines. This medicine is used to treat anemia caused by a low amount of folic acid in the body. It is also used with 5-fluorouracil (5-FU) to treat colon cancer. This medicine may be used for other purposes; ask your health care provider or pharmacist if you have questions. What should I tell my health care provider before I take this medicine? They need to know if you have any of these conditions:  anemia from low levels of vitamin B-12 in the blood  an unusual or allergic reaction to leucovorin, folic acid, other  medicines, foods, dyes, or preservatives  pregnant or trying to get pregnant  breast-feeding How should I use this medicine? This medicine is for injection into a muscle or into a vein. It is given by a health care professional in a hospital or clinic setting. Talk to your pediatrician regarding the use of this medicine in children. Special care may be needed. Overdosage: If you think you have taken too much of this medicine contact a poison control center or emergency room at once. NOTE: This medicine is only for you. Do not share this medicine with others. What if I miss a dose? This does not apply. What may interact with this medicine?  capecitabine  fluorouracil  phenobarbital  phenytoin  primidone  trimethoprim-sulfamethoxazole This list may not describe all possible interactions. Give your health care provider a list of all the medicines, herbs, non-prescription drugs, or dietary supplements you use. Also tell them if you smoke, drink alcohol, or use illegal drugs. Some items may interact with your medicine. What should I watch for while using this medicine? Your condition will be monitored carefully while you are receiving this medicine. This medicine may increase the side effects of 5-fluorouracil, 5-FU. Tell your doctor or health care professional if you have diarrhea or mouth sores that do not get better or that get worse. What side effects may I notice from receiving this medicine? Side effects that you should report to your doctor or health care professional as soon as possible:  allergic reactions like skin rash, itching or hives, swelling of the face, lips, or tongue  breathing problems  fever, infection  mouth sores  unusual bleeding or bruising  unusually weak or tired Side effects that usually do not require medical attention (report to your doctor or health care professional if they continue or are bothersome):  constipation or diarrhea  loss of  appetite  nausea, vomiting This list may not describe all possible side effects. Call your doctor for medical advice about side effects. You may report side effects to FDA at 1-800-FDA-1088. Where should I keep my medicine? This drug is given in a hospital or clinic and will not be stored at home. NOTE: This sheet is a summary. It may not cover all possible information. If you have questions about this medicine, talk to your doctor, pharmacist, or health  care provider.  2020 Elsevier/Gold Standard (2008-03-21 16:50:29)  Fluorouracil, 5FU; Diclofenac topical cream What is this medicine? FLUOROURACIL; DICLOFENAC (flure oh YOOR a sil; dye KLOE fen ak) is a combination of a topical chemotherapy agent and non-steroidal anti-inflammatory drug (NSAID). It is used on the skin to treat skin cancer and skin conditions that could become cancer. This medicine may be used for other purposes; ask your health care provider or pharmacist if you have questions. COMMON BRAND NAME(S): FLUORAC What should I tell my health care provider before I take this medicine? They need to know if you have any of these conditions:  bleeding problems  cigarette smoker  DPD enzyme deficiency  heart disease  high blood pressure  if you frequently drink alcohol containing drinks  kidney disease  liver disease  open or infected skin  stomach problems  swelling or open sores at the treatment site  recent or planned coronary artery bypass graft (CABG) surgery  an unusual or allergic reaction to fluorouracil, diclofenac, aspirin, other NSAIDs, other medicines, foods, dyes, or preservatives  pregnant or trying to get pregnant  breast-feeding How should I use this medicine? This medicine is only for use on the skin. Follow the directions on the prescription label. Wash hands before and after use. Wash affected area and gently pat dry. To apply this medicine use a cotton-tipped applicator, or use gloves if  applying with fingertips. If applied with unprotected fingertips, it is very important to wash your hands well after you apply this medicine. Avoid applying to the eyes, nose, or mouth. Apply enough medicine to cover the affected area. You can cover the area with a light gauze dressing, but do not use tight or air-tight dressings. Finish the full course prescribed by your doctor or health care professional, even if you think your condition is better. Do not stop taking except on the advice of your doctor or health care professional. Talk to your pediatrician regarding the use of this medicine in children. Special care may be needed. Overdosage: If you think you have taken too much of this medicine contact a poison control center or emergency room at once. NOTE: This medicine is only for you. Do not share this medicine with others. What if I miss a dose? If you miss a dose, apply it as soon as you can. If it is almost time for your next dose, only use that dose. Do not apply extra doses. Contact your doctor or health care professional if you miss more than one dose. What may interact with this medicine? Interactions are not expected. Do not use any other skin products without telling your doctor or health care professional. This list may not describe all possible interactions. Give your health care provider a list of all the medicines, herbs, non-prescription drugs, or dietary supplements you use. Also tell them if you smoke, drink alcohol, or use illegal drugs. Some items may interact with your medicine. What should I watch for while using this medicine? Visit your doctor or healthcare provider for checks on your progress. You will need to use this medicine for 2 to 6 weeks. This may be longer depending on the condition being treated. You may not see full healing for another 1 to 2 months after you stop using the medicine. This medicine may cause serious skin reactions. They can happen weeks to months  after starting the medicine. Contact your healthcare provider right away if you notice fevers or flu-like symptoms with a rash. The rash  may be red or purple and then turn into blisters or peeling of the skin. Or, you might notice a red rash with swelling of the face, lips or lymph nodes in your neck or under your arms. Treated areas of skin can look unsightly during and for several weeks after treatment with this medicine. This medicine can make you more sensitive to the sun. Keep out of the sun. If you cannot avoid being in the sun, wear protective clothing and use sunscreen. Do not use sun lamps or tanning beds/booths. If a pet comes in contact with the area where this medicine was applied to your skin or if it is ingested, they may have a serious risk of side effects. If accidental contact happens, the skin of the pet should be washed right away with soap and water. Contact your vet right away if your pet becomes exposed. Do not become pregnant while taking this medicine. Women should inform their doctor if they wish to become pregnant or think they might be pregnant. There is a potential for serious side effects to an unborn child. Talk to your healthcare provider or pharmacist for more information. What side effects may I notice from receiving this medicine? Side effects that you should report to your doctor or health care professional as soon as possible:  allergic reactions like skin rash, itching or hives, swelling of the face, lips, or tongue  black or bloody stools, blood in the urine or vomit  blurred vision  chest pain  difficulty breathing or wheezing  rash, fever, and swollen lymph nodes  redness, blistering, peeling or loosening of the skin, including inside the mouth  severe redness and swelling of normal skin  slurred speech or weakness on one side of the body  trouble passing urine or change in the amount of urine  unexplained weight gain or swelling  unusually weak  or tired  yellowing of eyes or skin Side effects that usually do not require medical attention (report to your doctor or health care professional if they continue or are bothersome):  increased sensitivity of the skin to sun and ultraviolet light  pain and burning of the affected area  scaling or swelling of the affected area  skin rash, itching of the affected area  tenderness This list may not describe all possible side effects. Call your doctor for medical advice about side effects. You may report side effects to FDA at 1-800-FDA-1088. Where should I keep my medicine? Keep out of the reach of children and pets. Store at room temperature between 20 and 25 degrees C (68 and 77 degrees F). Throw away any unused medicine after the expiration date. NOTE: This sheet is a summary. It may not cover all possible information. If you have questions about this medicine, talk to your doctor, pharmacist, or health care provider.  2020 Elsevier/Gold Standard (2018-12-01 13:31:57)

## 2020-05-24 ENCOUNTER — Telehealth: Payer: Self-pay | Admitting: Hematology

## 2020-05-24 ENCOUNTER — Telehealth: Payer: Self-pay | Admitting: *Deleted

## 2020-05-24 NOTE — Telephone Encounter (Signed)
Scheduled per 8/25 los. Pt is aware of appt times and dates. Noted to give pt appt calendar on next visit.

## 2020-05-25 ENCOUNTER — Inpatient Hospital Stay: Payer: BC Managed Care – PPO

## 2020-05-25 ENCOUNTER — Other Ambulatory Visit: Payer: Self-pay

## 2020-05-25 ENCOUNTER — Encounter: Payer: Self-pay | Admitting: Hematology

## 2020-05-25 ENCOUNTER — Encounter: Payer: Self-pay | Admitting: General Practice

## 2020-05-25 VITALS — BP 97/73 | HR 64 | Temp 98.1°F | Resp 18

## 2020-05-25 DIAGNOSIS — C19 Malignant neoplasm of rectosigmoid junction: Secondary | ICD-10-CM

## 2020-05-25 MED ORDER — SODIUM CHLORIDE 0.9% FLUSH
10.0000 mL | INTRAVENOUS | Status: DC | PRN
  Administered 2020-05-25: 10 mL
  Filled 2020-05-25: qty 10

## 2020-05-25 MED ORDER — PEGFILGRASTIM-CBQV 6 MG/0.6ML ~~LOC~~ SOSY
6.0000 mg | PREFILLED_SYRINGE | Freq: Once | SUBCUTANEOUS | Status: AC
  Administered 2020-05-25: 6 mg via SUBCUTANEOUS

## 2020-05-25 MED ORDER — HEPARIN SOD (PORK) LOCK FLUSH 100 UNIT/ML IV SOLN
500.0000 [IU] | Freq: Once | INTRAVENOUS | Status: AC | PRN
  Administered 2020-05-25: 500 [IU]
  Filled 2020-05-25: qty 5

## 2020-05-25 NOTE — Progress Notes (Signed)
Met with patient at registration to sign grant approval.  Patient approved for one-time $1000 [employer] grant to assist with personal expenses while going through treatment. Discussed expenses in detail and how they are submitted. She has a copy of the approval letter as well as the expense sheet and Outpatient pharmacy information. She received a gas card today from her grant.  She has my card for any additional financial questions or concerns.

## 2020-05-25 NOTE — Patient Instructions (Signed)

## 2020-05-25 NOTE — Progress Notes (Signed)
[place] CSW Progress Notes  Request from [employer], Estate manager/land agent, to speak w patient re distress re rent.  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  Received help w back rent and utilities from [facility] program which brought her current w rent.  Is now unable to work due to diagnosis of cancer, worried about paying Sept rent as she has no income.  [place] has submitted her application for disability 8/24, but decision will not be made before rent is due.  Spoke w patient, obtained permission to refer to [facility] for possible emergency assistance.  Asked CSW Dalene Seltzer to update patient early next week.  Edwyna Shell, LCSW Clinical Social Worker Phone:  [phone] Cell:  [phone]

## 2020-05-28 ENCOUNTER — Telehealth: Payer: Self-pay | Admitting: Nurse Practitioner

## 2020-05-28 ENCOUNTER — Telehealth: Payer: Self-pay

## 2020-05-28 NOTE — Telephone Encounter (Signed)
Ms Mittleman left vm stating she has white vaginal discharge for the past couple of days.  She states there is no odor.  I returned her call.  She states there is no itching or pain. She is afebrile. She said it is"dripping" out of her.  She needs to wear a pantiliner.  She states she has had vaginal infections in the past.  I reminded her of her phone visit with Cira Rue NP tomorrow.  I told her I would forward this information to [facility].  She verbalized understanding.

## 2020-05-29 ENCOUNTER — Inpatient Hospital Stay (HOSPITAL_BASED_OUTPATIENT_CLINIC_OR_DEPARTMENT_OTHER): Payer: BC Managed Care – PPO | Admitting: Nurse Practitioner

## 2020-05-29 ENCOUNTER — Encounter: Payer: Self-pay | Admitting: Nurse Practitioner

## 2020-05-29 DIAGNOSIS — C19 Malignant neoplasm of rectosigmoid junction: Secondary | ICD-10-CM | POA: Diagnosis not present

## 2020-05-29 MED ORDER — FLUCONAZOLE 200 MG PO TABS: 200.0000 mg | ORAL_TABLET | Freq: Once | ORAL | 0 refills | Status: AC

## 2020-05-29 NOTE — Progress Notes (Signed)
[place]   [phone] [phone]   Clinic Follow up Note   Patient Care Team: Patient, [name] as PCP - General (General Practice) [name], RN as Oncology Nurse Navigator [name], NP as Nurse Practitioner (Nurse Practitioner) [name], MD as Consulting Physician (Oncology) 05/29/2020  I connected with Arnaldo Natal on 05/29/20 at  9:15 AM EDT by telephone visit and verified that I am speaking with the correct person using two identifiers.   I discussed the limitations, risks, security and privacy concerns of performing an evaluation and management service by telemedicine and the availability of in-person appointments. I also discussed with the patient that there may be a patient responsible charge related to this service. The patient expressed understanding and agreed to proceed.   Other persons participating in the visit and their role in the encounter: N?A  Patient's location: Home Provider's location: [place] office  Chief Complaint: Follow-up colon cancer, chemo toxicity check  SUMMARY OF ONCOLOGIC HISTORY: Oncology History Overview Note  Cancer Staging Malignant neoplasm of rectosigmoid junction [facility]) Staging form: Colon and Rectum, AJCC 8th Edition - Pathologic stage from 04/04/2020: pT4a, pN1b, cM1 - Signed by [name], NP on 05/07/2020    Malignant neoplasm of rectosigmoid junction ([place])  03/12/2020 Initial Diagnosis   Malignant neoplasm of rectosigmoid junction ([place])   03/12/2020 Imaging   CT AP with contrast IMPRESSION: 1. Overall findings are highly concerning for colorectal carcinoma involving the sigmoid colon with an associated perforation and adjacent abscess and phlegmon formation as detailed above. Currently, no collection is amenable to percutaneous drainage given their small size and location. 2. New 2 cm mass in the right hepatic lobe concerning for metastatic disease to the liver until  proven otherwise. 3. Enlarged regional lymph nodes as detailed above is concerning for nodal metastatic disease. 4. Large stool burden. 5. Prominent pelvic veins which can be seen in patients with pelvic congestion syndrome.   03/13/2020 Imaging   ABD US IMPRESSION: Approximately 2.1 x 2.4 x 2.0 cm lobular homogeneously echogenic lesion in the right hepatic dome corresponds with the abnormality seen on the prior CT scan. Sonographically, this appearance is highly suggestive of a benign hemangioma.   Recommend MRI of the abdomen with gadolinium contrast which may provide a noninvasive diagnosis of benign hemangioma.    03/13/2020 Imaging   MR ABD W/WO CONTRAST Hepatobiliary: Diffuse low signal intensity throughout the hepatic parenchyma on T2 weighted images, presumably a consequence of recent Feraheme injection. In segment 7 of the liver (axial image 8 of series 5) there is a 2.5 x 1.9 cm well-defined lesion which is slightly T2 hyperintense. This lesion appears hyperintense on pre gadolinium T1 weighted images (likely a consequence of Feraheme). Interpretation of enhancement within the lesion is compromised by presence of Feraheme. No other hepatic lesions are confidently identified on today's examination. No intra or extrahepatic biliary ductal dilatation. Gallbladder is normal in appearance.   03/31/2020 Imaging   CT AP W contrast IMPRESSION: 1. Previously noted sigmoid colon mass appears increased in size, and again appears to be associated with a focal contained perforation which crosses the midline and has fistulized into the left adnexal region where there is now what appears to be a large left tubo-ovarian abscess, as detailed above. This is also associated with multiple enlarged lymph nodes in the pelvis measuring up to 1.2 cm in short axis and borderline enlarged retroperitoneal lymph nodes, concerning for metastatic disease. In addition,  previously suspected metastatic  lesion in segment 7 of the liver has enlarged. 2. Small volume of ascites. 3. Additional incidental findings, as above.   04/04/2020 Cancer Staging   Staging form: Colon and Rectum, AJCC 8th Edition - Pathologic stage from 04/04/2020: pT4a, pN1b, cM1 - Signed by [name], NP on 05/07/2020   04/04/2020 Procedure   Paracentesis, path showed no malignant cells (mixed acute and chronic inflammation present)   04/04/2020 Surgery   Open sigmoid colectomy and end colostomy by Dr. [name]   04/04/2020 Pathology Results   FINAL MICROSCOPIC DIAGNOSIS: A. COLON, RECTOSIGMOID, RESECTION: - Invasive moderately differentiated adenocarcinoma, 6 cm, involving rectosigmoid junction - Carcinoma invades into serosal surface with perforation and associated serositis - Radial resection margin is positive for carcinoma; proximal and distal margins are not involved - Lymphovascular invasion is present - Metastatic carcinoma to one of fifteen lymph nodes (1/15); one tumor deposit - See oncology table B. LYMPH NODES, MESENTERIC, RESECTION: - Metastatic adenocarcinoma to one of six lymph nodes (1/6) - One tumor deposit  Addendum to note 2 involved lymph nodes (of 21 examined nodes) pT4a,pN1b MMR-normal, preserved expression of MLH1, MSH2, MSH6, PMS2   04/12/2020 Procedure   PAC placement    04/13/2020 Imaging   CT chest without contrast IMPRESSION: Interval development of bilateral pleural effusions, left slightly greater than right, with resultant bibasilar atelectasis including subtotal collapse of the left lower lobe. No evidence of intrathoracic metastatic disease, though evaluation of the collapsed parenchyma is limited. Hepatic metastasis again demonstrated.     04/16/2020 Pathology Results   FINAL MICROSCOPIC DIAGNOSIS:  A. LIVER, RIGHT LOBE, BIOPSY:  - Adenocarcinoma.  COMMENT:  The morphology is compatible with the provided clinical history of colorectal carcinoma.    05/23/2020 -   Chemotherapy   FOLFIRINOX q2weeks for 3-6 months starting 05/23/20   06/06/2020 -  Chemotherapy   The patient had bevacizumab-bvzr (ZIRABEV) 400 mg in sodium chloride 0.9 % 100 mL chemo infusion, 5 mg/kg = 400 mg, Intravenous,  Once, 0 of 6 cycles  for chemotherapy treatment.      CURRENT THERAPY: FOLFOXIRI with G-CSF, dose reduced irinotecan and 5-FU with cycle 1 starting 05/23/2020.  INTERVAL HISTORY: [name] was reached by phone for virtual toxicity check.  Now she is doing "fine."  She felt "rough the night of pump d/c and G-CSF, took tramadol.  She was fatigued on day 4.  She had mild nausea and higher ostomy output for 3-4 days after chemo.  She had up to 6-7 soft stools one day.  She is now back to normal after taking Imodium once.  She took Compazine and Zofran for nausea which was effective.  She remains able to eat and drink, does not think she has lost weight.  She does not feel dizzy on standing.  She did not notice cold sensitivity but avoided cold exposures.  She has some gum sensitivity with brushing but no mouth sores.  For the past few days she had a white milky vaginal discharge that is not usual for her.  She had a menstrual period before starting chemo.  She is not sexually active.  Denies burning or itching.  No dysuria.  Denies fever, chills, cough, chest pain, dyspnea, skin rash, abdominal or other pain, opening of the incision, or other concerns.   MEDICAL HISTORY:  Past Medical History:  Diagnosis Date  . BV (bacterial vaginosis)   . Lactose intolerance 03/12/2020  . UTI (lower urinary tract infection)  SURGICAL HISTORY: Past Surgical History:  Procedure Laterality Date  . CESAREAN SECTION    . CYSTOSCOPY WITH STENT PLACEMENT  04/04/2020   Procedure: CYSTOSCOPY WITH STENT PLACEMENT;  Surgeon: [name], MD;  Location: [facility];  Service: General;;  . LAPAROTOMY N/A 04/04/2020   Procedure: EXPLORATORY LAPAROTOMY;  Surgeon: [name], MD;  Location: [facility];   Service: General;  Laterality: N/A;  . PORTACATH PLACEMENT Right 04/12/2020   Procedure: INSERTION PORT-A-CATH WITH ULTRASOUND;  Surgeon: Kieth Brightly Arta Bruce, MD;  Location: [facility];  Service: General;  Laterality: Right;    I have reviewed the social history and family history with the patient and they are unchanged from previous note.  ALLERGIES:  has No Known Allergies.  MEDICATIONS:  Current Outpatient Medications  Medication Sig Dispense Refill  . diclofenac Sodium (VOLTAREN) 1 % GEL Apply 2 g topically 4 (four) times daily. 50 g 0  . famotidine (PEPCID) 20 MG tablet Take 1 tablet (20 mg total) by mouth 2 (two) times daily. 60 tablet 0  . ferrous sulfate 325 (65 FE) MG tablet Take 1 tablet (325 mg total) by mouth 2 (two) times daily with a meal. 60 tablet 0  . fluconazole (DIFLUCAN) 200 MG tablet Take 1 tablet (200 mg total) by mouth once for 1 dose. 1 tablet 0  . lidocaine-prilocaine (EMLA) cream Apply to affected area once 30 g 3  . ondansetron (ZOFRAN ODT) 4 MG disintegrating tablet Take 1 tablet (4 mg total) by mouth every 8 (eight) hours as needed for nausea or vomiting. 30 tablet 0  . ondansetron (ZOFRAN) 8 MG tablet Take 1 tablet (8 mg total) by mouth 2 (two) times daily as needed. Start on day 3 after chemotherapy. 30 tablet 1  . PARAGARD INTRAUTERINE COPPER IU 1 Device by Intrauterine route once.     . polyethylene glycol (MIRALAX / GLYCOLAX) 17 g packet Take 17 g by mouth 2 (two) times daily. 14 each 0  . prochlorperazine (COMPAZINE) 10 MG tablet Take 1 tablet (10 mg total) by mouth every 6 (six) hours as needed (Nausea or vomiting). 30 tablet 1  . senna-docusate (SENOKOT-S) 8.6-50 MG tablet Take 2 tablets by mouth 2 (two) times daily. 120 tablet 0  . traMADol (ULTRAM) 50 MG tablet Take 1 tablet (50 mg total) by mouth every 12 (twelve) hours as needed for severe pain. 30 tablet 0   No current facility-administered medications for this visit.    PHYSICAL EXAMINATION: ECOG  PERFORMANCE STATUS: 0 - Asymptomatic  There were no vitals filed for this visit. There were no vitals filed for this visit.  Patient appears well over the phone, alert and oriented.  Speech is clear and intact.  No cough or conversational dyspnea.  LABORATORY DATA:  There were no labs filed for this visit.   RADIOGRAPHIC STUDIES: I have personally reviewed the radiological images as listed and agreed with the findings in the report. No results found.   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 Barry Dienes on 7/7/21she was found to have perforation and positive radial margin.Liver biopsy on 04/16/2020 confirmed metastatic disease from her colon cancer -We again reviewed this is stage IV colon cancer s/p surgical resection of the primary tumor with oligo liver metastasis, overall the disease burden appears to  be low. We discussed in this case when the patient is young and fit we treat aggressively to potentially cure her disease which is achievable and approximately 20% of patients with multimodality therapies. -Dr. Burr Medico is recommending systemic treatment with FOLFOXIRI q2 weeks for3-6 months. If she has dramatic response with limited residual metastatic disease and no other distant metastasis, she may be a candidate for liver resection or targeted liver therapy such as ablation -FoundationOnewhich shows K-ras mutation, she is not a candidate for EGFR inhibitor. We will likely add Avastin once her wound completely heals.  -recovered well from surgery, wound healed appropriately. She started Cycle 1 FOLFOXIRI with dose-reduced irinotecan and 5FU on 05/23/20.   2. Intrabdominal abscess secondary to sigmoid  colon perforation -Culture showed staph epidermidis, treated with cefepime and fagyl  3.Anemia, secondary to #1 and iron deficiency -She had mild intermittent blood in her stool for the past several months, attributed to hemorrhoids -S/p IV Feraheme in 02/2020 during hospitalization, Ferritin improved to 124 -Denies recurrent bleeding lately -Not currently on oral iron, Hgb improved to 11.5, she received second dose Feraheme on 05/11/20 -OK to resume daily women's vitamin with Iron   Disposition: [name] appears stable.  She is currently s/p cycle 1 day 7 FOLFOXIRI with dose reduced irinotecan and 5-FU. She tolerated treatment well with mild fatigue, nausea and increased ostomy output. Symptoms were well managed with supportive meds at home and eventually resolved. She has recovered well.   She had developed what sounds like vaginal yeast infection. I prescribed fluconazole 200 mg po x1. There are no DDIs with her current med list or chemotherapy per pharmacy.   I do not feel she needs to come in for lab, evaluation, or supportive care today; she is able to manage at home. She knows to call clinic if any symptoms/side effects worsen or fail to improve.   She will return for f/u and cycle 2 next week. If she continues to do well, plan to increase irinotecan and 5FU and add bevacizumab with cycle 2.   All questions were answered. The patient knows to call the clinic with any problems, questions or concerns. No barriers to learning was detected. I spent total of 15 minutes in today's virtual encounter.      [name], NP 05/29/20

## 2020-05-30 ENCOUNTER — Encounter: Payer: Self-pay | Admitting: *Deleted

## 2020-05-30 ENCOUNTER — Other Ambulatory Visit: Payer: Self-pay | Admitting: Nurse Practitioner

## 2020-05-30 MED ORDER — FLUCONAZOLE 150 MG PO TABS: 150.0000 mg | ORAL_TABLET | Freq: Once | ORAL | 0 refills | Status: AC

## 2020-05-30 NOTE — Progress Notes (Signed)
[place] Clinical Social Work  CSW received information from [employer] that 2 churches would be able to help patient with September rent.  CSW shared information with patient and forwarded requested information for patients landlord.  CSW also encouraged patient to apply for the [facility] program, and patient plans to complete application today.    Johnnye Lana, MSW, LCSW, OSW-C Clinical Social Worker [facility] [phone]

## 2020-05-31 ENCOUNTER — Other Ambulatory Visit: Payer: Self-pay

## 2020-05-31 ENCOUNTER — Telehealth: Payer: Self-pay | Admitting: General Practice

## 2020-05-31 DIAGNOSIS — B9689 Other specified bacterial agents as the cause of diseases classified elsewhere: Secondary | ICD-10-CM

## 2020-05-31 MED ORDER — METRONIDAZOLE 500 MG PO TABS: 500.0000 mg | ORAL_TABLET | Freq: Two times a day (BID) | ORAL | 0 refills | Status: AC

## 2020-05-31 NOTE — Progress Notes (Signed)
Patient calls has history of bacterial vaginosis, started with discharge and itching on Tuesday now having fishy odor with discharge.  Consulted with Cira Rue NP sent in Flagyl to pharmacy on file.   I spoke to patient to let her know.  She verbalized an understanding.

## 2020-05-31 NOTE — Telephone Encounter (Signed)
[place] CSW Progress Notes  Call to patient - through the work of [employer], two local churches will cover patient's Sept rent. Checks will be mailed directly to the landlord, due to arrive next week. Patient asked to notify her landlord of this so they are aware that funds are coming.  Edwyna Shell, LCSW Clinical Social Worker Phone:  [phone] Cell:  [phone]

## 2020-06-05 NOTE — Progress Notes (Signed)
[place]   [phone] [phone]   Clinic Follow up Note   Patient Care Team: Patient, [name] as PCP - General (General Practice) Stephanie Finner, RN as Oncology Nurse Navigator [name], NP as Nurse Practitioner (Nurse Practitioner) [name], MD as Consulting Physician (Oncology) 06/06/2020  CHIEF COMPLAINT: Follow-up colon cancer  SUMMARY OF ONCOLOGIC HISTORY: Oncology History Overview Note  Cancer Staging Malignant neoplasm of rectosigmoid junction [facility]) Staging form: Colon and Rectum, AJCC 8th Edition - Pathologic stage from 04/04/2020: pT4a, pN1b, cM1 - Signed by [name], NP on 05/07/2020    Malignant neoplasm of rectosigmoid junction ([place])  03/12/2020 Initial Diagnosis   Malignant neoplasm of rectosigmoid junction ([place])   03/12/2020 Imaging   CT AP with contrast IMPRESSION: 1. Overall findings are highly concerning for colorectal carcinoma involving the sigmoid colon with an associated perforation and adjacent abscess and phlegmon formation as detailed above. Currently, no collection is amenable to percutaneous drainage given their small size and location. 2. New 2 cm mass in the right hepatic lobe concerning for metastatic disease to the liver until proven otherwise. 3. Enlarged regional lymph nodes as detailed above is concerning for nodal metastatic disease. 4. Large stool burden. 5. Prominent pelvic veins which can be seen in patients with pelvic congestion syndrome.   03/13/2020 Imaging   ABD US IMPRESSION: Approximately 2.1 x 2.4 x 2.0 cm lobular homogeneously echogenic lesion in the right hepatic dome corresponds with the abnormality seen on the prior CT scan. Sonographically, this appearance is highly suggestive of a benign hemangioma.   Recommend MRI of the abdomen with gadolinium contrast which may provide a noninvasive diagnosis of benign hemangioma.    03/13/2020 Imaging   MR ABD W/WO  CONTRAST Hepatobiliary: Diffuse low signal intensity throughout the hepatic parenchyma on T2 weighted images, presumably a consequence of recent Feraheme injection. In segment 7 of the liver (axial image 8 of series 5) there is a 2.5 x 1.9 cm well-defined lesion which is slightly T2 hyperintense. This lesion appears hyperintense on pre gadolinium T1 weighted images (likely a consequence of Feraheme). Interpretation of enhancement within the lesion is compromised by presence of Feraheme. No other hepatic lesions are confidently identified on today's examination. No intra or extrahepatic biliary ductal dilatation. Gallbladder is normal in appearance.   03/31/2020 Imaging   CT AP W contrast IMPRESSION: 1. Previously noted sigmoid colon mass appears increased in size, and again appears to be associated with a focal contained perforation which crosses the midline and has fistulized into the left adnexal region where there is now what appears to be a large left tubo-ovarian abscess, as detailed above. This is also associated with multiple enlarged lymph nodes in the pelvis measuring up to 1.2 cm in short axis and borderline enlarged retroperitoneal lymph nodes, concerning for metastatic disease. In addition, previously suspected metastatic lesion in segment 7 of the liver has enlarged. 2. Small volume of ascites. 3. Additional incidental findings, as above.   04/04/2020 Cancer Staging   Staging form: Colon and Rectum, AJCC 8th Edition - Pathologic stage from 04/04/2020: pT4a, pN1b, cM1 - Signed by [name], NP on 05/07/2020   04/04/2020 Procedure   Paracentesis, path showed no malignant cells (mixed acute and chronic inflammation present)   04/04/2020 Surgery   Open sigmoid colectomy and end colostomy by Dr. [name]   04/04/2020 Pathology Results   FINAL MICROSCOPIC DIAGNOSIS: A. COLON, RECTOSIGMOID, RESECTION: - Invasive moderately differentiated adenocarcinoma,  6 cm, involving  rectosigmoid junction - Carcinoma invades into serosal surface with perforation and associated serositis - Radial resection margin is positive for carcinoma; proximal and distal margins are not involved - Lymphovascular invasion is present - Metastatic carcinoma to one of fifteen lymph nodes (1/15); one tumor deposit - See oncology table B. LYMPH NODES, MESENTERIC, RESECTION: - Metastatic adenocarcinoma to one of six lymph nodes (1/6) - One tumor deposit  Addendum to note 2 involved lymph nodes (of 21 examined nodes) pT4a,pN1b MMR-normal, preserved expression of MLH1, MSH2, MSH6, PMS2   04/12/2020 Procedure   PAC placement    04/13/2020 Imaging   CT chest without contrast IMPRESSION: Interval development of bilateral pleural effusions, left slightly greater than right, with resultant bibasilar atelectasis including subtotal collapse of the left lower lobe. No evidence of intrathoracic metastatic disease, though evaluation of the collapsed parenchyma is limited. Hepatic metastasis again demonstrated.     04/16/2020 Pathology Results   FINAL MICROSCOPIC DIAGNOSIS:  A. LIVER, RIGHT LOBE, BIOPSY:  - Adenocarcinoma.  COMMENT:  The morphology is compatible with the provided clinical history of colorectal carcinoma.    05/23/2020 -  Chemotherapy   FOLFIRINOX q2weeks for 3-6 months starting 05/23/20   06/06/2020 -  Chemotherapy   The patient had bevacizumab-bvzr (ZIRABEV) 400 mg in sodium chloride 0.9 % 100 mL chemo infusion, 5 mg/kg = 400 mg, Intravenous,  Once, 1 of 6 cycles  for chemotherapy treatment.      CURRENT THERAPY:  FOLFOXIRI with G-CSF, dose reduced irinotecan and 5-FU with cycle 1 starting 05/23/2020.  Plan to add Avastin with cycle 2  INTERVAL HISTORY: [name] returns for follow-up and treatment as scheduled.  She completed cycle 1 FOLFOXIRI with G-CSF on 8/25. We spoke last week and she had temporary fatigue, bone pain, and higher ostomy output but recovered well.  She started flagyl for BV and her symptoms resolved, last dose 9/7. She felt great this week until she ate the wrong butter and spicy food a few days ago. Since then she has had upset stomach with some soreness and more nausea. No vomiting. She is still eating and drinking but has lost weight she attributes to eating healthier since her surgery. She remains up out of bed and active at home. She notes her abdominal incision is closed and healed, it remains covered to "keep it clean." Denies fever, chills, cough, chest pain, dyspnea, edema, rash, or other concerns.     MEDICAL HISTORY:  Past Medical History:  Diagnosis Date  . BV (bacterial vaginosis)   . Lactose intolerance 03/12/2020  . UTI (lower urinary tract infection)     SURGICAL HISTORY: Past Surgical History:  Procedure Laterality Date  . CESAREAN SECTION    . CYSTOSCOPY WITH STENT PLACEMENT  04/04/2020   Procedure: CYSTOSCOPY WITH STENT PLACEMENT;  Surgeon: [name], MD;  Location: [facility];  Service: General;;  . LAPAROTOMY N/A 04/04/2020   Procedure: EXPLORATORY LAPAROTOMY;  Surgeon: [name], MD;  Location: [facility];  Service: General;  Laterality: N/A;  . PORTACATH PLACEMENT Right 04/12/2020   Procedure: INSERTION PORT-A-CATH WITH ULTRASOUND;  Surgeon: Kieth Brightly Arta Bruce, MD;  Location: [facility];  Service: General;  Laterality: Right;    I have reviewed the social history and family history with the patient and they are unchanged from previous note.  ALLERGIES:  has No Known Allergies.  MEDICATIONS:  Current Outpatient Medications  Medication Sig Dispense Refill  . diclofenac Sodium (VOLTAREN) 1 % GEL Apply 2 g  topically 4 (four) times daily. 50 g 0  . famotidine (PEPCID) 20 MG tablet Take 1 tablet (20 mg total) by mouth 2 (two) times daily. 60 tablet 0  . ferrous sulfate 325 (65 FE) MG tablet Take 1 tablet (325 mg total) by mouth 2 (two) times daily with a meal. 60 tablet 0  . lidocaine-prilocaine (EMLA) cream Apply to  affected area once 30 g 3  . metroNIDAZOLE (FLAGYL) 500 MG tablet Take 1 tablet (500 mg total) by mouth 2 (two) times daily for 7 days. 14 tablet 0  . ondansetron (ZOFRAN ODT) 4 MG disintegrating tablet Take 1 tablet (4 mg total) by mouth every 8 (eight) hours as needed for nausea or vomiting. 30 tablet 0  . ondansetron (ZOFRAN) 8 MG tablet Take 1 tablet (8 mg total) by mouth 2 (two) times daily as needed. Start on day 3 after chemotherapy. 30 tablet 1  . PARAGARD INTRAUTERINE COPPER IU 1 Device by Intrauterine route once.     . polyethylene glycol (MIRALAX / GLYCOLAX) 17 g packet Take 17 g by mouth 2 (two) times daily. 14 each 0  . prochlorperazine (COMPAZINE) 10 MG tablet Take 1 tablet (10 mg total) by mouth every 6 (six) hours as needed (Nausea or vomiting). 30 tablet 1  . senna-docusate (SENOKOT-S) 8.6-50 MG tablet Take 2 tablets by mouth 2 (two) times daily. 120 tablet 0  . traMADol (ULTRAM) 50 MG tablet Take 1 tablet (50 mg total) by mouth every 12 (twelve) hours as needed for severe pain. 30 tablet 0   No current facility-administered medications for this visit.   Facility-Administered Medications Ordered in Other Visits  Medication Dose Route Frequency Provider Last Rate Last Admin  . fluorouracil (ADRUCIL) 5,000 mg in sodium chloride 0.9 % 150 mL chemo infusion  5,000 mg Intravenous 1 day or 1 dose [name], MD        PHYSICAL EXAMINATION: ECOG PERFORMANCE STATUS: 1 - Symptomatic but completely ambulatory  Vitals:   06/06/20 0815  BP: 109/74  Pulse: 78  Resp: 18  Temp: 98.3 F (36.8 C)   Filed Weights   06/06/20 0815  Weight: 155 lb 6.4 oz (70.5 kg)    GENERAL:alert, no distress and comfortable SKIN: no rash to exposed skin  EYES: sclera clear LUNGS: clear with normal breathing effort HEART: regular rate & rhythm, no lower extremity edema ABDOMEN:abdomen soft, non-tender and normal bowel sounds. Midline dressing c/d/i NEURO: alert & oriented x 3 with fluent speech,  normal gait PAC without erythema   LABORATORY DATA:  I have reviewed the data as listed CBC Latest Ref Rng & Units 06/06/2020 05/23/2020 05/03/2020  WBC 4.0 - 10.5 K/uL 7.7 4.8 6.3  Hemoglobin 12.0 - 15.0 g/dL 14.2 12.0 11.5(L)  Hematocrit 36 - 46 % 42.4 37.4 36.3  Platelets 150 - 400 K/uL 178 207 212     CMP Latest Ref Rng & Units 06/06/2020 05/23/2020 05/03/2020  Glucose 70 - 99 mg/dL 98 81 108(H)  BUN 6 - 20 mg/dL $Remove'12 8 8  'LIqCklr$ Creatinine 0.44 - 1.00 mg/dL 0.72 0.65 0.70  Sodium 135 - 145 mmol/L 138 139 139  Potassium 3.5 - 5.1 mmol/L 3.5 3.8 3.5  Chloride 98 - 111 mmol/L 105 104 106  CO2 22 - 32 mmol/L $RemoveB'22 27 26  'SwKgLbAe$ Calcium 8.9 - 10.3 mg/dL 9.2 9.3 9.2  Total Protein 6.5 - 8.1 g/dL 7.5 6.8 7.1  Total Bilirubin 0.3 - 1.2 mg/dL <0.2(L) <0.2(L) 0.2(L)  Alkaline Phos 38 -  126 U/L 89 58 64  AST 15 - 41 U/L 20 13(L) 12(L)  ALT 0 - 44 U/L $Remo'20 9 7      'jxONG$ RADIOGRAPHIC STUDIES: I have personally reviewed the radiological images as listed and agreed with the findings in the report. No results found.   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 Barry Dienes on 7/7/21she was found to have perforation and positive radial margin.Liver biopsy on 04/16/2020 confirmed metastatic disease from her colon cancer -We again reviewedthis isstage IV colon cancer s/p surgical resection of the primary tumor with oligo liver metastasis, overall the disease burden appears to be low. We discussed in this case when the patient is young and fit we treat aggressively to potentially cure her disease which is achievable and approximately 20% of patients with multimodality therapies. -Dr. Burr Medico is recommending systemic treatment with FOLFOXIRI q2  weeks for3-6 months.If she has dramatic response with limited residual metastatic disease and no other distant metastasis, she may be a candidate for liver resection or targeted liver therapy such as ablation -FoundationOnewhich shows K-ras mutation, she is not a candidate for EGFR inhibitor. We will likely add Avastin once her wound completely heals. -recovered well from surgery, wound healed appropriately. She started Cycle 1 FOLFOXIRI with dose-reduced irinotecan and 5FU on 05/23/20. tolerated well with mild fatigue, nausea, and higher ostomy output. She recovered well -plan to increase to full dose and add bevacizumab with cycle 2 (06/06/20)  2. Intrabdominal abscess secondary to sigmoid colon perforation -Culture showed staph epidermidis, treated with cefepime and fagyl  3.Anemia, secondary to #1 and iron deficiency -She had mild intermittent blood in her stool for the past several months, attributed to hemorrhoids -S/p IV Feraheme in 02/2020 during hospitalization, Ferritin improved to 124 -Denies recurrent bleeding lately -Not currently on oral iron, anemia resolved after surgery. She received second dose Feraheme on 05/11/20 -OK to resume daily women's vitamin with Iron   Disposition:  Stephanie [name] appears stable. She completed cycle 1 FOLFOXIRI with dose-reduced irinotecan and 5FU. She tolerated treatment well with mild fatigue, nausea, and increased ostomy output. Symptoms were well managed with supportive meds at home and she recovered well. Today's symptoms are mostly diet-related.   We reviewed the CBC and CMP from today. She will proceed with cycle 2 FOLFOXIRI which will be increased to full dose. We are adding bevacizumab, the rationale and potential toxicities were discussed, she agrees to proceed.   She has lost some weight, I encouraged her to increase supplements. Will add IVF with pump d/c. She will return for follow up/toxicity check and possible IVF next week, then  f/u and cycle 3 in 2 weeks.   All questions were answered. The patient knows to call the clinic with any problems, questions or concerns. No barriers to learning were detected.     [name], NP 06/06/20

## 2020-06-06 ENCOUNTER — Encounter: Payer: Self-pay | Admitting: Nurse Practitioner

## 2020-06-06 ENCOUNTER — Inpatient Hospital Stay: Payer: BC Managed Care – PPO

## 2020-06-06 ENCOUNTER — Other Ambulatory Visit: Payer: Self-pay

## 2020-06-06 ENCOUNTER — Inpatient Hospital Stay: Payer: BC Managed Care – PPO | Attending: Nurse Practitioner | Admitting: Nurse Practitioner

## 2020-06-06 VITALS — BP 109/74 | HR 78 | Temp 98.3°F | Resp 18 | Ht 69.0 in | Wt 155.4 lb

## 2020-06-06 DIAGNOSIS — D508 Other iron deficiency anemias: Secondary | ICD-10-CM | POA: Diagnosis not present

## 2020-06-06 DIAGNOSIS — C19 Malignant neoplasm of rectosigmoid junction: Secondary | ICD-10-CM

## 2020-06-06 DIAGNOSIS — Z5189 Encounter for other specified aftercare: Secondary | ICD-10-CM | POA: Insufficient documentation

## 2020-06-06 DIAGNOSIS — Z9049 Acquired absence of other specified parts of digestive tract: Secondary | ICD-10-CM | POA: Insufficient documentation

## 2020-06-06 DIAGNOSIS — C787 Secondary malignant neoplasm of liver and intrahepatic bile duct: Secondary | ICD-10-CM | POA: Insufficient documentation

## 2020-06-06 DIAGNOSIS — Z79899 Other long term (current) drug therapy: Secondary | ICD-10-CM | POA: Diagnosis not present

## 2020-06-06 DIAGNOSIS — Z5111 Encounter for antineoplastic chemotherapy: Secondary | ICD-10-CM | POA: Insufficient documentation

## 2020-06-06 DIAGNOSIS — Z5112 Encounter for antineoplastic immunotherapy: Secondary | ICD-10-CM | POA: Diagnosis present

## 2020-06-06 LAB — CBC WITH DIFFERENTIAL (CANCER CENTER ONLY)
Abs Immature Granulocytes: 0.07 10*3/uL (ref 0.00–0.07)
Basophils Absolute: 0.1 10*3/uL (ref 0.0–0.1)
Basophils Relative: 1 %
Eosinophils Absolute: 0.1 10*3/uL (ref 0.0–0.5)
Eosinophils Relative: 1 %
HCT: 42.4 % (ref 36.0–46.0)
Hemoglobin: 14.2 g/dL (ref 12.0–15.0)
Immature Granulocytes: 1 %
Lymphocytes Relative: 22 %
Lymphs Abs: 1.7 10*3/uL (ref 0.7–4.0)
MCH: 28.2 pg (ref 26.0–34.0)
MCHC: 33.5 g/dL (ref 30.0–36.0)
MCV: 84.1 fL (ref 80.0–100.0)
Monocytes Absolute: 0.6 10*3/uL (ref 0.1–1.0)
Monocytes Relative: 7 %
Neutro Abs: 5.2 10*3/uL (ref 1.7–7.7)
Neutrophils Relative %: 68 %
Platelet Count: 178 10*3/uL (ref 150–400)
RBC: 5.04 MIL/uL (ref 3.87–5.11)
RDW: 15.9 % — ABNORMAL HIGH (ref 11.5–15.5)
WBC Count: 7.7 10*3/uL (ref 4.0–10.5)
nRBC: 0 % (ref 0.0–0.2)

## 2020-06-06 LAB — CMP (CANCER CENTER ONLY)
ALT: 20 U/L (ref 0–44)
AST: 20 U/L (ref 15–41)
Albumin: 4.1 g/dL (ref 3.5–5.0)
Alkaline Phosphatase: 89 U/L (ref 38–126)
Anion gap: 11 (ref 5–15)
BUN: 12 mg/dL (ref 6–20)
CO2: 22 mmol/L (ref 22–32)
Calcium: 9.2 mg/dL (ref 8.9–10.3)
Chloride: 105 mmol/L (ref 98–111)
Creatinine: 0.72 mg/dL (ref 0.44–1.00)
GFR, Est AFR Am: >60 mL/min (ref >60)
GFR, Estimated: >60 mL/min (ref >60)
Glucose, Bld: 98 mg/dL (ref 70–99)
Potassium: 3.5 mmol/L (ref 3.5–5.1)
Sodium: 138 mmol/L (ref 135–145)
Total Bilirubin: <0.2 mg/dL — ABNORMAL LOW (ref 0.3–1.2)
Total Protein: 7.5 g/dL (ref 6.5–8.1)

## 2020-06-06 LAB — IRON AND TIBC
Iron: 53 ug/dL (ref 41–142)
Saturation Ratios: 21 % (ref 21–57)
TIBC: 256 ug/dL (ref 236–444)
UIBC: 203 ug/dL (ref 120–384)

## 2020-06-06 LAB — CEA (IN HOUSE-CHCC): CEA (CHCC-In House): 3.32 ng/mL (ref 0.00–5.00)

## 2020-06-06 LAB — FERRITIN: Ferritin: 215 ng/mL (ref 11–307)

## 2020-06-06 MED ORDER — ALUM & MAG HYDROXIDE-SIMETH 200-200-20 MG/5ML PO SUSP: 15.0000 mL | Freq: Once | ORAL | Status: AC

## 2020-06-06 MED ORDER — ATROPINE SULFATE 1 MG/ML IJ SOLN
INTRAMUSCULAR | Status: AC
  Filled 2020-06-06: qty 1

## 2020-06-06 MED ORDER — SODIUM CHLORIDE 0.9 % IV SOLN
165.0000 mg/m2 | Freq: Once | INTRAVENOUS | Status: AC
  Administered 2020-06-06: 300 mg via INTRAVENOUS
  Filled 2020-06-06: qty 15

## 2020-06-06 MED ORDER — PALONOSETRON HCL INJECTION 0.25 MG/5ML
INTRAVENOUS | Status: AC
  Filled 2020-06-06: qty 5

## 2020-06-06 MED ORDER — SODIUM CHLORIDE 0.9 % IV SOLN
5000.0000 mg | INTRAVENOUS | Status: DC
  Administered 2020-06-06: 5000 mg via INTRAVENOUS
  Filled 2020-06-06: qty 100

## 2020-06-06 MED ORDER — SODIUM CHLORIDE 0.9 % IV SOLN
10.0000 mg | Freq: Once | INTRAVENOUS | Status: AC
  Administered 2020-06-06: 10 mg via INTRAVENOUS
  Filled 2020-06-06: qty 10

## 2020-06-06 MED ORDER — LEUCOVORIN CALCIUM INJECTION 350 MG
200.0000 mg/m2 | Freq: Once | INTRAVENOUS | Status: AC
  Administered 2020-06-06: 374 mg via INTRAVENOUS
  Filled 2020-06-06: qty 18.7

## 2020-06-06 MED ORDER — ALUM & MAG HYDROXIDE-SIMETH 200-200-20 MG/5ML PO SUSP
ORAL | Status: AC
  Filled 2020-06-06: qty 30

## 2020-06-06 MED ORDER — SODIUM CHLORIDE 0.9 % IV SOLN
150.0000 mg | Freq: Once | INTRAVENOUS | Status: AC
  Administered 2020-06-06: 150 mg via INTRAVENOUS
  Filled 2020-06-06: qty 150

## 2020-06-06 MED ORDER — SODIUM CHLORIDE 0.9 % IV SOLN
Freq: Once | INTRAVENOUS | Status: AC
  Filled 2020-06-06: qty 250

## 2020-06-06 MED ORDER — ATROPINE SULFATE 1 MG/ML IJ SOLN
0.5000 mg | Freq: Once | INTRAMUSCULAR | Status: AC | PRN
  Administered 2020-06-06: 0.5 mg via INTRAVENOUS

## 2020-06-06 MED ORDER — ALUM & MAG HYDROXIDE-SIMETH 200-200-20 MG/5ML PO SUSP
30.0000 mL | Freq: Once | ORAL | Status: AC
  Administered 2020-06-06: 30 mL via ORAL

## 2020-06-06 MED ORDER — SODIUM CHLORIDE 0.9 % IV SOLN
5.0000 mg/kg | Freq: Once | INTRAVENOUS | Status: AC
  Administered 2020-06-06: 400 mg via INTRAVENOUS
  Filled 2020-06-06: qty 16

## 2020-06-06 MED ORDER — OXALIPLATIN CHEMO INJECTION 100 MG/20ML
85.0000 mg/m2 | Freq: Once | INTRAVENOUS | Status: AC
  Administered 2020-06-06: 160 mg via INTRAVENOUS
  Filled 2020-06-06: qty 32

## 2020-06-06 MED ORDER — DEXTROSE 5 % IV SOLN
Freq: Once | INTRAVENOUS | Status: AC
  Filled 2020-06-06: qty 250

## 2020-06-06 MED ORDER — PALONOSETRON HCL INJECTION 0.25 MG/5ML
0.2500 mg | Freq: Once | INTRAVENOUS | Status: AC
  Administered 2020-06-06: 0.25 mg via INTRAVENOUS

## 2020-06-06 NOTE — Patient Instructions (Signed)
[facility] Discharge Instructions for Patients Receiving Chemotherapy  Today you received the following chemotherapy agents: Becvacizumab (Avastin), Irinotecan, Oxaliplatin, and Fluorouracil  To help prevent nausea and vomiting after your treatment, we encourage you to take your nausea medication  as prescribed.    If you develop nausea and vomiting that is not controlled by your nausea medication, call the clinic.   BELOW ARE SYMPTOMS THAT SHOULD BE REPORTED IMMEDIATELY:  *FEVER GREATER THAN 100.5 F  *CHILLS WITH OR WITHOUT FEVER  NAUSEA AND VOMITING THAT IS NOT CONTROLLED WITH YOUR NAUSEA MEDICATION  *UNUSUAL SHORTNESS OF BREATH  *UNUSUAL BRUISING OR BLEEDING  TENDERNESS IN MOUTH AND THROAT WITH OR WITHOUT PRESENCE OF ULCERS  *URINARY PROBLEMS  *BOWEL PROBLEMS  UNUSUAL RASH Items with * indicate a potential emergency and should be followed up as soon as possible.  Feel free to call the clinic should you have any questions or concerns. The clinic phone number is [phone].  Please show the [facility] at check-in to the Emergency Department and triage nurse.   Bevacizumab injection What is this medicine? BEVACIZUMAB (be va SIZ yoo mab) is a monoclonal antibody. It is used to treat many types of cancer. This medicine may be used for other purposes; ask your health care provider or pharmacist if you have questions. COMMON BRAND NAME(S): Avastin, MVASI, Zirabev What should I tell my health care provider before I take this medicine? They need to know if you have any of these conditions:  diabetes  heart disease  high blood pressure  history of coughing up blood  prior anthracycline chemotherapy (e.g., doxorubicin, daunorubicin, epirubicin)  recent or ongoing radiation therapy  recent or planning to have surgery  stroke  an unusual or allergic reaction to bevacizumab, hamster proteins, mouse proteins, other medicines, foods, dyes, or  preservatives  pregnant or trying to get pregnant  breast-feeding How should I use this medicine? This medicine is for infusion into a vein. It is given by a health care professional in a hospital or clinic setting. Talk to your pediatrician regarding the use of this medicine in children. Special care may be needed. Overdosage: If you think you have taken too much of this medicine contact a poison control center or emergency room at once. NOTE: This medicine is only for you. Do not share this medicine with others. What if I miss a dose? It is important not to miss your dose. Call your doctor or health care professional if you are unable to keep an appointment. What may interact with this medicine? Interactions are not expected. This list may not describe all possible interactions. Give your health care provider a list of all the medicines, herbs, non-prescription drugs, or dietary supplements you use. Also tell them if you smoke, drink alcohol, or use illegal drugs. Some items may interact with your medicine. What should I watch for while using this medicine? Your condition will be monitored carefully while you are receiving this medicine. You will need important blood work and urine testing done while you are taking this medicine. This medicine may increase your risk to bruise or bleed. Call your doctor or health care professional if you notice any unusual bleeding. Before having surgery, talk to your health care provider to make sure it is ok. This drug can increase the risk of poor healing of your surgical site or wound. You will need to stop this drug for 28 days before surgery. After surgery, wait at least 28 days  before restarting this drug. Make sure the surgical site or wound is healed enough before restarting this drug. Talk to your health care provider if questions. Do not become pregnant while taking this medicine or for 6 months after stopping it. Women should inform their doctor if  they wish to become pregnant or think they might be pregnant. There is a potential for serious side effects to an unborn child. Talk to your health care professional or pharmacist for more information. Do not breast-feed an infant while taking this medicine and for 6 months after the last dose. This medicine has caused ovarian failure in some women. This medicine may interfere with the ability to have a child. You should talk to your doctor or health care professional if you are concerned about your fertility. What side effects may I notice from receiving this medicine? Side effects that you should report to your doctor or health care professional as soon as possible:  allergic reactions like skin rash, itching or hives, swelling of the face, lips, or tongue  chest pain or chest tightness  chills  coughing up blood  high fever  seizures  severe constipation  signs and symptoms of bleeding such as bloody or black, tarry stools; red or dark-brown urine; spitting up blood or brown material that looks like coffee grounds; red spots on the skin; unusual bruising or bleeding from the eye, gums, or nose  signs and symptoms of a blood clot such as breathing problems; chest pain; severe, sudden headache; pain, swelling, warmth in the leg  signs and symptoms of a stroke like changes in vision; confusion; trouble speaking or understanding; severe headaches; sudden numbness or weakness of the face, arm or leg; trouble walking; dizziness; loss of balance or coordination  stomach pain  sweating  swelling of legs or ankles  vomiting  weight gain Side effects that usually do not require medical attention (report to your doctor or health care professional if they continue or are bothersome):  back pain  changes in taste  decreased appetite  dry skin  nausea  tiredness This list may not describe all possible side effects. Call your doctor for medical advice about side effects. You may  report side effects to FDA at 1-800-FDA-1088. Where should I keep my medicine? This drug is given in a hospital or clinic and will not be stored at home. NOTE: This sheet is a summary. It may not cover all possible information. If you have questions about this medicine, talk to your doctor, pharmacist, or health care provider.  2020 Elsevier/Gold Standard (2019-07-13 10:50:46)

## 2020-06-06 NOTE — Progress Notes (Signed)
Per Dr. Burr Medico, ok to round Zirabev dose to 400 mg today (wt = 70.5 kg)  Kennith Center, Pharm.D., CPP 06/06/2020@10 :34 AM

## 2020-06-06 NOTE — Progress Notes (Signed)
Per pt, no potential that she has become pregnant in the past 2 weeks so we'll use the urine preg result from 05/23/20.  Kennith Center, Pharm.D., CPP 06/06/2020@10 :19 AM

## 2020-06-08 ENCOUNTER — Telehealth: Payer: Self-pay | Admitting: Nurse Practitioner

## 2020-06-08 ENCOUNTER — Other Ambulatory Visit: Payer: Self-pay | Admitting: Hematology

## 2020-06-08 ENCOUNTER — Other Ambulatory Visit: Payer: Self-pay

## 2020-06-08 ENCOUNTER — Ambulatory Visit: Payer: BC Managed Care – PPO

## 2020-06-08 ENCOUNTER — Inpatient Hospital Stay: Payer: BC Managed Care – PPO

## 2020-06-08 VITALS — BP 112/70 | HR 72 | Temp 98.2°F | Resp 18

## 2020-06-08 DIAGNOSIS — C19 Malignant neoplasm of rectosigmoid junction: Secondary | ICD-10-CM

## 2020-06-08 DIAGNOSIS — Z95828 Presence of other vascular implants and grafts: Secondary | ICD-10-CM

## 2020-06-08 DIAGNOSIS — R252 Cramp and spasm: Secondary | ICD-10-CM

## 2020-06-08 LAB — CMP (CANCER CENTER ONLY)
ALT: 14 U/L (ref 0–44)
AST: 17 U/L (ref 15–41)
Albumin: 3.2 g/dL — ABNORMAL LOW (ref 3.5–5.0)
Alkaline Phosphatase: 62 U/L (ref 38–126)
Anion gap: 5 (ref 5–15)
BUN: 10 mg/dL (ref 6–20)
CO2: 27 mmol/L (ref 22–32)
Calcium: 8.2 mg/dL — ABNORMAL LOW (ref 8.9–10.3)
Chloride: 106 mmol/L (ref 98–111)
Creatinine: 0.66 mg/dL (ref 0.44–1.00)
GFR, Est AFR Am: >60 mL/min (ref >60)
GFR, Estimated: >60 mL/min (ref >60)
Glucose, Bld: 121 mg/dL — ABNORMAL HIGH (ref 70–99)
Potassium: 3.3 mmol/L — ABNORMAL LOW (ref 3.5–5.1)
Sodium: 138 mmol/L (ref 135–145)
Total Bilirubin: <0.2 mg/dL — ABNORMAL LOW (ref 0.3–1.2)
Total Protein: 6 g/dL — ABNORMAL LOW (ref 6.5–8.1)

## 2020-06-08 LAB — MAGNESIUM: Magnesium: 1.9 mg/dL (ref 1.7–2.4)

## 2020-06-08 MED ORDER — SODIUM CHLORIDE 0.9% FLUSH
10.0000 mL | Freq: Once | INTRAVENOUS | Status: AC
  Administered 2020-06-08: 10 mL
  Filled 2020-06-08: qty 10

## 2020-06-08 MED ORDER — HEPARIN SOD (PORK) LOCK FLUSH 100 UNIT/ML IV SOLN
500.0000 [IU] | Freq: Once | INTRAVENOUS | Status: AC
  Administered 2020-06-08: 500 [IU]
  Filled 2020-06-08: qty 5

## 2020-06-08 MED ORDER — PEGFILGRASTIM-CBQV 6 MG/0.6ML ~~LOC~~ SOSY
PREFILLED_SYRINGE | SUBCUTANEOUS | Status: AC
  Filled 2020-06-08: qty 0.6

## 2020-06-08 MED ORDER — PEGFILGRASTIM-CBQV 6 MG/0.6ML ~~LOC~~ SOSY
6.0000 mg | PREFILLED_SYRINGE | Freq: Once | SUBCUTANEOUS | Status: AC
  Administered 2020-06-08: 6 mg via SUBCUTANEOUS

## 2020-06-08 MED ORDER — SODIUM CHLORIDE 0.9% FLUSH
10.0000 mL | INTRAVENOUS | Status: DC | PRN
  Administered 2020-06-08: 10 mL
  Filled 2020-06-08: qty 10

## 2020-06-08 MED ORDER — POTASSIUM CHLORIDE CRYS ER 20 MEQ PO TBCR: 20.0000 meq | EXTENDED_RELEASE_TABLET | Freq: Every day | ORAL | 1 refills | Status: DC

## 2020-06-08 MED ORDER — HEPARIN SOD (PORK) LOCK FLUSH 100 UNIT/ML IV SOLN
500.0000 [IU] | Freq: Once | INTRAVENOUS | Status: AC | PRN
  Administered 2020-06-08: 500 [IU]
  Filled 2020-06-08: qty 5

## 2020-06-08 MED ORDER — SODIUM CHLORIDE 0.9 % IV SOLN
INTRAVENOUS | Status: DC
  Filled 2020-06-08: qty 250

## 2020-06-08 NOTE — Telephone Encounter (Signed)
Scheduled per 9/7 los. Pt is aware of appts added on 9/11 and 9/14

## 2020-06-08 NOTE — Patient Instructions (Signed)

## 2020-06-08 NOTE — Patient Instructions (Signed)

## 2020-06-09 ENCOUNTER — Telehealth: Payer: Self-pay | Admitting: Hematology

## 2020-06-09 ENCOUNTER — Other Ambulatory Visit: Payer: Self-pay | Admitting: Hematology

## 2020-06-09 ENCOUNTER — Inpatient Hospital Stay: Payer: BC Managed Care – PPO

## 2020-06-09 MED ORDER — DICYCLOMINE HCL 10 MG PO CAPS: 10.0000 mg | ORAL_CAPSULE | Freq: Three times a day (TID) | ORAL | 0 refills | Status: DC

## 2020-06-09 NOTE — Telephone Encounter (Signed)
Pt called and reported worsening abdominal pain, which gets worse after eating. It's been going on for the past week, worse after chemo 4 days ago. She had diarrhea last night, but resolved on it's own. Mild nausea, no vomiting. No fever or chills. She is able to eat and drink. I will call in Bentyl 10mg  tid, and she can continue pepcid bid. She will call us back if symptoms not improving, or new symptoms. She is scheduled for IVF next Tuesday.   [name]  06/09/2020 4:57 PM

## 2020-06-10 NOTE — Telephone Encounter (Signed)
OK, thanks for the update. We discussed this at her visit last week, she felt it was diet-related since she's been following a vegan-based healthier meal plan since diagnosis/surgery, and she had eaten some spicy food that didn't agree with her. I will see her Tuesday when she's here for fluids.   Thanks, Regan Rakers

## 2020-06-11 ENCOUNTER — Inpatient Hospital Stay: Payer: BC Managed Care – PPO | Admitting: Medical

## 2020-06-11 ENCOUNTER — Telehealth: Payer: Self-pay

## 2020-06-11 ENCOUNTER — Other Ambulatory Visit: Payer: Self-pay

## 2020-06-11 ENCOUNTER — Inpatient Hospital Stay: Payer: BC Managed Care – PPO

## 2020-06-11 ENCOUNTER — Ambulatory Visit (HOSPITAL_COMMUNITY)
Admission: RE | Admit: 2020-06-11 | Discharge: 2020-06-11 | Disposition: A | Discharge status: Home / Self Care | Payer: BC Managed Care – PPO | Source: Ambulatory Visit | Attending: Medical | Admitting: Medical

## 2020-06-11 VITALS — BP 107/81 | HR 70 | Temp 98.1°F | Resp 18 | Ht 69.0 in | Wt 153.0 lb

## 2020-06-11 DIAGNOSIS — R1084 Generalized abdominal pain: Secondary | ICD-10-CM | POA: Insufficient documentation

## 2020-06-11 DIAGNOSIS — Z95828 Presence of other vascular implants and grafts: Secondary | ICD-10-CM

## 2020-06-11 DIAGNOSIS — C19 Malignant neoplasm of rectosigmoid junction: Secondary | ICD-10-CM

## 2020-06-11 DIAGNOSIS — R252 Cramp and spasm: Secondary | ICD-10-CM

## 2020-06-11 DIAGNOSIS — K219 Gastro-esophageal reflux disease without esophagitis: Secondary | ICD-10-CM

## 2020-06-11 LAB — CBC WITH DIFFERENTIAL (CANCER CENTER ONLY)
Abs Immature Granulocytes: 0.68 10*3/uL — ABNORMAL HIGH (ref 0.00–0.07)
Basophils Absolute: 0.1 10*3/uL (ref 0.0–0.1)
Basophils Relative: 0 %
Eosinophils Absolute: 0.5 10*3/uL (ref 0.0–0.5)
Eosinophils Relative: 2 %
HCT: 44.2 % (ref 36.0–46.0)
Hemoglobin: 14.6 g/dL (ref 12.0–15.0)
Immature Granulocytes: 3 %
Lymphocytes Relative: 8 %
Lymphs Abs: 1.9 10*3/uL (ref 0.7–4.0)
MCH: 28.3 pg (ref 26.0–34.0)
MCHC: 33 g/dL (ref 30.0–36.0)
MCV: 85.7 fL (ref 80.0–100.0)
Monocytes Absolute: 0.5 10*3/uL (ref 0.1–1.0)
Monocytes Relative: 2 %
Neutro Abs: 19.1 10*3/uL — ABNORMAL HIGH (ref 1.7–7.7)
Neutrophils Relative %: 85 %
Platelet Count: 147 10*3/uL — ABNORMAL LOW (ref 150–400)
RBC: 5.16 MIL/uL — ABNORMAL HIGH (ref 3.87–5.11)
RDW: 15.6 % — ABNORMAL HIGH (ref 11.5–15.5)
WBC Count: 22.8 10*3/uL — ABNORMAL HIGH (ref 4.0–10.5)
nRBC: 0 % (ref 0.0–0.2)

## 2020-06-11 LAB — CMP (CANCER CENTER ONLY)
ALT: 15 U/L (ref 0–44)
AST: 17 U/L (ref 15–41)
Albumin: 4 g/dL (ref 3.5–5.0)
Alkaline Phosphatase: 113 U/L (ref 38–126)
Anion gap: 8 (ref 5–15)
BUN: 7 mg/dL (ref 6–20)
CO2: 25 mmol/L (ref 22–32)
Calcium: 9.2 mg/dL (ref 8.9–10.3)
Chloride: 104 mmol/L (ref 98–111)
Creatinine: 0.79 mg/dL (ref 0.44–1.00)
GFR, Est AFR Am: >60 mL/min (ref >60)
GFR, Estimated: >60 mL/min (ref >60)
Glucose, Bld: 86 mg/dL (ref 70–99)
Potassium: 4.1 mmol/L (ref 3.5–5.1)
Sodium: 137 mmol/L (ref 135–145)
Total Bilirubin: 0.3 mg/dL (ref 0.3–1.2)
Total Protein: 7.5 g/dL (ref 6.5–8.1)

## 2020-06-11 LAB — MAGNESIUM: Magnesium: 2.1 mg/dL (ref 1.7–2.4)

## 2020-06-11 MED ORDER — SODIUM CHLORIDE 0.9 % IV SOLN
Freq: Once | INTRAVENOUS | Status: AC
  Filled 2020-06-11: qty 250

## 2020-06-11 MED ORDER — ALUM & MAG HYDROXIDE-SIMETH 200-200-20 MG/5ML PO SUSP
30.0000 mL | Freq: Once | ORAL | Status: AC
  Administered 2020-06-11: 30 mL via ORAL

## 2020-06-11 MED ORDER — MORPHINE SULFATE (PF) 2 MG/ML IV SOLN
1.0000 mg | Freq: Once | INTRAVENOUS | Status: AC
  Administered 2020-06-11: 1 mg via INTRAVENOUS

## 2020-06-11 MED ORDER — HEPARIN SOD (PORK) LOCK FLUSH 100 UNIT/ML IV SOLN
500.0000 [IU] | Freq: Once | INTRAVENOUS | Status: AC
  Administered 2020-06-11: 500 [IU]
  Filled 2020-06-11: qty 5

## 2020-06-11 MED ORDER — SODIUM CHLORIDE 0.9% FLUSH
10.0000 mL | Freq: Once | INTRAVENOUS | Status: AC
  Administered 2020-06-11: 10 mL
  Filled 2020-06-11: qty 10

## 2020-06-11 MED ORDER — PANTOPRAZOLE SODIUM 40 MG PO TBEC: 40.0000 mg | DELAYED_RELEASE_TABLET | Freq: Two times a day (BID) | ORAL | 5 refills | Status: DC

## 2020-06-11 MED ORDER — ALUM & MAG HYDROXIDE-SIMETH 200-200-20 MG/5ML PO SUSP
ORAL | Status: AC
  Filled 2020-06-11: qty 30

## 2020-06-11 MED ORDER — MORPHINE SULFATE (PF) 2 MG/ML IV SOLN
INTRAVENOUS | Status: AC
  Filled 2020-06-11: qty 1

## 2020-06-11 NOTE — Patient Instructions (Signed)

## 2020-06-11 NOTE — Telephone Encounter (Signed)
Pt. At symptom management today for abdominal pain Pt. Seen by Sandi Mealy PA  KUB ordered and 1 liter of IV fluids. Pt.'s port accessed , IV fluids given, also ordered Mylanta and 1 mg of morphine. Morphine given by Valda Favia RN. Pt tolerated well. Pain reassessed she stated it was 4 out 8 and tolerable. Port de-accessed flushed and heparin locked. Pt. Declined AVS left symptom management clinic stable.

## 2020-06-11 NOTE — Progress Notes (Signed)
[facility] Progress Note   Stephanie [name] [id number] [birth year] 39 y.o.  Stephanie [name] is managed by Dr. 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[place]) - Plan: DG Abd 2 Views  Generalized abdominal pain - Plan: DG Abd 2 Views, 0.9 %  sodium chloride infusion, morphine 2 MG/ML injection 1 mg  Gastroesophageal reflux disease, unspecified whether esophagitis present - Plan: alum & mag hydroxide-simeth (MAALOX/MYLANTA) 200-200-20 MG/5ML suspension 30 mL, pantoprazole (PROTONIX) 40 MG tablet  Port-A-Cath in place - Plan: heparin lock flush 100 unit/mL, sodium chloride flush (NS) 0.9 % injection 10 mL   Malignant neoplasm of the rectosigmoid junction: Stephanie [name] is status post cycle 2 of FOLFIRINOX and Zirabev which was dosed on 06/06/2020.  She is scheduled to return for follow-up on 06/20/2020.  Generalized abdominal pain: She was given morphine sulfate 1 mg IV x1 after she was referred for an x-ray of her abdomen which returned showing:  FINDINGS: Left colostomy.  Moderate stool in the colon without bowel obstruction.  No renal calculi.  IUD in good position.  No significant skeletal abnormality .  IMPRESSION: Moderate stool in the colon.   No bowel obstruction.  Gastroesophageal reflux: The patient was given a prescription for Mylanta today.  Additionally, she was given a prescription for Protonix 40 mg 1-2 times daily.  Please see After Visit Summary for patient specific instructions.  Future Appointments  Date Time Provider [place]  06/20/2020  8:15 AM [facility] None  06/20/2020  8:30 AM [facility] [place] FLUSH [facility] None  06/20/2020  9:00 AM  [name], MD [facility] None  06/20/2020 10:00 AM [facility] INFUSION [facility] None  06/22/2020 11:30 AM [facility] [place] FLUSH [facility] None  07/04/2020  8:30 AM [facility] None  07/04/2020  8:45 AM [facility] [place] FLUSH [facility] None  07/04/2020  9:15 AM [name], NP [facility] None  07/04/2020 10:15 AM [facility] INFUSION [facility] None  07/06/2020 11:30 AM [facility] [place] FLUSH [facility] None  07/18/2020  8:15 AM [facility] None  07/18/2020  8:30 AM [facility] [place] None  07/18/2020  9:00 AM [name], MD [facility] None  07/18/2020  9:45 AM [facility] INFUSION [facility] None  07/20/2020 12:00 PM [facility] [place] FLUSH [facility] None    Orders Placed This Encounter  Procedures  . DG Abd 2 Views       Subjective:   Patient ID:  Stephanie [name] is a 33 y.o. (DOB 1980/10/25) female.  Chief Complaint: No chief complaint on file.   HPI Stephanie [name]  is a 39 y.o. female with a diagnosis of a malignant neoplasm of the rectosigmoid junction. She is followed by Dr. Burr Medico and is status post cycle 2 of FOLFIRINOX and Zirabev which was dosed on 06/06/2020.  She presents today with abdominal pain over the last week with minimal nausea with no vomiting.  She had diarrhea on Saturday.  Her last treatment was last Wednesday.  Her 5-FU pump was disconnected on Friday.  She had no output to her ostomy yesterday.  She reports having abdominal cramping and pain with all p.o. intake.  She is not on a proton pump inhibitor.  She is having episodes of GERD.  She denies fevers, chills or sweats.  She was referred  for an x-ray of her abdomen which returned showing:  FINDINGS: Left colostomy.  Moderate stool in the colon without bowel obstruction.  No renal calculi.  IUD in good position.  No significant skeletal abnormality .  IMPRESSION: Moderate stool in the colon.   No bowel obstruction.  Medications: I have reviewed the  patient's current medications.  Allergies: No Known Allergies  Past Medical History:  Diagnosis Date  . BV (bacterial vaginosis)   . Lactose intolerance 03/12/2020  . UTI (lower urinary tract infection)     Past Surgical History:  Procedure Laterality Date  . CESAREAN SECTION    . CYSTOSCOPY WITH STENT PLACEMENT  04/04/2020   Procedure: CYSTOSCOPY WITH STENT PLACEMENT;  Surgeon: [name], MD;  Location: [facility];  Service: General;;  . LAPAROTOMY N/A 04/04/2020   Procedure: EXPLORATORY LAPAROTOMY;  Surgeon: [name], MD;  Location: [facility];  Service: General;  Laterality: N/A;  . PORTACATH PLACEMENT Right 04/12/2020   Procedure: INSERTION PORT-A-CATH WITH ULTRASOUND;  Surgeon: Kieth Brightly Arta Bruce, MD;  Location: [facility];  Service: General;  Laterality: Right;    Family History  Problem Relation Age of Onset  . Heart disease Father   . Colon cancer Neg Hx   . Esophageal cancer Neg Hx     Social History   Socioeconomic History  . Marital status: Single    Spouse name: Not on file  . Number of children: Not on file  . Years of education: Not on file  . Highest education level: Not on file  Occupational History  . Not on file  Tobacco Use  . Smoking status: Former Smoker    Packs/day: 0.50    Years: 10.00    Pack years: 5.00    Types: Cigarettes  . Smokeless tobacco: Never Used  Vaping Use  . Vaping Use: Never used  Substance and Sexual Activity  . Alcohol use: Yes    Comment: seldom  . Drug use: No  . Sexual activity: Yes    Birth control/protection: None, Other-see comments    Comment: nuva ring  Other Topics Concern  . Not on file  Social History Narrative  . Not on file   Social Determinants of Health   Financial Resource Strain:   . Difficulty of Paying Living Expenses: Not on file  Food Insecurity:   . Worried About Charity fundraiser in the Last Year: Not on file  . Ran Out of Food in the Last Year: Not on file  Transportation Needs:   . Lack  of Transportation (Medical): Not on file  . Lack of Transportation (Non-Medical): Not on file  Physical Activity:   . Days of Exercise per Week: Not on file  . Minutes of Exercise per Session: Not on file  Stress:   . Feeling of Stress : Not on file  Social Connections:   . Frequency of Communication with Friends and Family: Not on file  . Frequency of Social Gatherings with Friends and Family: Not on file  . Attends Religious Services: Not on file  . Active Member of Clubs or Organizations: Not on file  . Attends Archivist Meetings: Not on file  . Marital Status: Not on file  Intimate Partner Violence:   . Fear of Current or Ex-Partner: Not on file  . Emotionally Abused: Not on file  . Physically Abused: Not on file  . Sexually Abused: Not on file    Past Medical History, Surgical history, Social history, and Family  history were reviewed and updated as appropriate.   Please see review of systems for further details on the patient's review from today.   Review of Systems:  Review of Systems  Constitutional: Negative for activity change, appetite change, chills, diaphoresis and fever.  HENT: Negative for trouble swallowing.   Respiratory: Negative for cough, chest tightness and shortness of breath.   Cardiovascular: Negative for chest pain, palpitations and leg swelling.  Gastrointestinal: Positive for abdominal pain, diarrhea and nausea. Negative for abdominal distention, constipation and vomiting.       GERD  Neurological: Negative for dizziness.    Objective:   Physical Exam:  BP 107/81   Pulse 70   Temp 98.1 F (36.7 C) (Oral)   Resp 18   Ht 5\' 9"  (1.753 m)   Wt 153 lb (69.4 kg)   SpO2 100%   BMI 22.59 kg/m  ECOG: 0  Physical Exam Constitutional:      General: She is not in acute distress.    Appearance: She is not diaphoretic.  HENT:     Head: Normocephalic and atraumatic.     Mouth/Throat:     Pharynx: No oropharyngeal exudate.    Cardiovascular:     Rate and Rhythm: Normal rate and regular rhythm.     Heart sounds: Normal heart sounds. No murmur heard.  No friction rub. No gallop.   Pulmonary:     Effort: Pulmonary effort is normal. No respiratory distress.     Breath sounds: Normal breath sounds. No wheezing or rales.  Abdominal:     General: Bowel sounds are normal. There is no distension.     Palpations: Abdomen is soft. There is no mass.     Tenderness: There is abdominal tenderness. There is no guarding or rebound.  Musculoskeletal:     Cervical back: Normal range of motion and neck supple.  Lymphadenopathy:     Cervical: No cervical adenopathy.  Skin:    General: Skin is warm and dry.     Findings: No erythema or rash.  Neurological:     Mental Status: She is alert.     Coordination: Coordination normal.  Psychiatric:        Behavior: Behavior normal.        Thought Content: Thought content normal.        Judgment: Judgment normal.     Lab Review:     Component Value Date/Time   NA 137 06/11/2020 0936   K 4.1 06/11/2020 0936   CL 104 06/11/2020 0936   CO2 25 06/11/2020 0936   GLUCOSE 86 06/11/2020 0936   BUN 7 06/11/2020 0936   CREATININE 0.79 06/11/2020 0936   CALCIUM 9.2 06/11/2020 0936   PROT 7.5 06/11/2020 0936   ALBUMIN 4.0 06/11/2020 0936   AST 17 06/11/2020 0936   ALT 15 06/11/2020 0936   ALKPHOS 113 06/11/2020 0936   BILITOT 0.3 06/11/2020 0936   GFRNONAA >60 06/11/2020 0936   GFRAA >60 06/11/2020 0936       Component Value Date/Time   WBC 22.8 (H) 06/11/2020 0936   WBC 12.2 (H) 04/16/2020 0542   RBC 5.16 (H) 06/11/2020 0936   HGB 14.6 06/11/2020 0936   HCT 44.2 06/11/2020 0936   PLT 147 (L) 06/11/2020 0936   MCV 85.7 06/11/2020 0936   MCH 28.3 06/11/2020 0936   MCHC 33.0 06/11/2020 0936   RDW 15.6 (H) 06/11/2020 0936   LYMPHSABS 1.9 06/11/2020 0936   MONOABS 0.5 06/11/2020 0936   EOSABS  0.5 06/11/2020 0936   BASOSABS 0.1 06/11/2020 0936    -------------------------------  Imaging from last 24 hours (if applicable):  Radiology interpretation: DG Abd 2 Views  Result Date: 06/11/2020 CLINICAL DATA:  Acute onset abdominal pain in setting of colon cancer. EXAM: X-RAY ABDOMEN 2 VIEWS COMPARISON:  04/11/2020 FINDINGS: Left colostomy. Moderate stool in the colon without bowel obstruction. No renal calculi. IUD in good position. No significant skeletal abnormality . IMPRESSION: Moderate stool in the colon.  No bowel obstruction. Electronically Signed   By: Franchot Gallo M.D.   On: 06/11/2020 10:53        This case was discussed with Dr. Burr Medico.

## 2020-06-11 NOTE — Telephone Encounter (Signed)
Patient calls with complaint of severe abdominal pain, spoke with Dr. Burr Medico over the weekend, has been taking Bentyl helping slightly.  Per Dr. Burr Medico have patient come into be seen in [facility].  Instructed patient to come on in to be evaluated.

## 2020-06-12 ENCOUNTER — Inpatient Hospital Stay: Payer: BC Managed Care – PPO

## 2020-06-12 ENCOUNTER — Inpatient Hospital Stay: Payer: BC Managed Care – PPO | Admitting: Nurse Practitioner

## 2020-06-18 NOTE — Progress Notes (Signed)
[place]   [phone] [phone]   Clinic Follow up Note   Patient Care Team: Patient, [name] as PCP - General (General Practice) [name], RN as Oncology Nurse Navigator [name], NP as Nurse Practitioner (Nurse Practitioner) [name], MD as Consulting Physician (Oncology)  Date of Service:  06/20/2020  CHIEF COMPLAINT: f/u of metastatic colon cancer  SUMMARY OF ONCOLOGIC HISTORY: Oncology History Overview Note  Cancer Staging Malignant neoplasm of rectosigmoid junction [facility]) Staging form: Colon and Rectum, AJCC 8th Edition - Pathologic stage from 04/04/2020: pT4a, pN1b, cM1 - Signed by [name], NP on 05/07/2020    Malignant neoplasm of rectosigmoid junction ([place])  03/12/2020 Initial Diagnosis   Malignant neoplasm of rectosigmoid junction ([place])   03/12/2020 Imaging   CT AP with contrast IMPRESSION: 1. Overall findings are highly concerning for colorectal carcinoma involving the sigmoid colon with an associated perforation and adjacent abscess and phlegmon formation as detailed above. Currently, no collection is amenable to percutaneous drainage given their small size and location. 2. New 2 cm mass in the right hepatic lobe concerning for metastatic disease to the liver until proven otherwise. 3. Enlarged regional lymph nodes as detailed above is concerning for nodal metastatic disease. 4. Large stool burden. 5. Prominent pelvic veins which can be seen in patients with pelvic congestion syndrome.   03/13/2020 Imaging   ABD US IMPRESSION: Approximately 2.1 x 2.4 x 2.0 cm lobular homogeneously echogenic lesion in the right hepatic dome corresponds with the abnormality seen on the prior CT scan. Sonographically, this appearance is highly suggestive of a benign hemangioma.   Recommend MRI of the abdomen with gadolinium contrast which may provide a noninvasive diagnosis of benign hemangioma.    03/13/2020  Imaging   MR ABD W/WO CONTRAST Hepatobiliary: Diffuse low signal intensity throughout the hepatic parenchyma on T2 weighted images, presumably a consequence of recent Feraheme injection. In segment 7 of the liver (axial image 8 of series 5) there is a 2.5 x 1.9 cm well-defined lesion which is slightly T2 hyperintense. This lesion appears hyperintense on pre gadolinium T1 weighted images (likely a consequence of Feraheme). Interpretation of enhancement within the lesion is compromised by presence of Feraheme. No other hepatic lesions are confidently identified on today's examination. No intra or extrahepatic biliary ductal dilatation. Gallbladder is normal in appearance.   03/31/2020 Imaging   CT AP W contrast IMPRESSION: 1. Previously noted sigmoid colon mass appears increased in size, and again appears to be associated with a focal contained perforation which crosses the midline and has fistulized into the left adnexal region where there is now what appears to be a large left tubo-ovarian abscess, as detailed above. This is also associated with multiple enlarged lymph nodes in the pelvis measuring up to 1.2 cm in short axis and borderline enlarged retroperitoneal lymph nodes, concerning for metastatic disease. In addition, previously suspected metastatic lesion in segment 7 of the liver has enlarged. 2. Small volume of ascites. 3. Additional incidental findings, as above.   04/04/2020 Cancer Staging   Staging form: Colon and Rectum, AJCC 8th Edition - Pathologic stage from 04/04/2020: pT4a, pN1b, cM1 - Signed by [name], NP on 05/07/2020   04/04/2020 Procedure   Paracentesis, path showed no malignant cells (mixed acute and chronic inflammation present)   04/04/2020 Surgery   Open sigmoid colectomy and end colostomy by Dr. [name]   04/04/2020 Pathology Results   FINAL MICROSCOPIC DIAGNOSIS: A. COLON,  RECTOSIGMOID, RESECTION: - Invasive moderately differentiated  adenocarcinoma, 6 cm, involving rectosigmoid junction - Carcinoma invades into serosal surface with perforation and associated serositis - Radial resection margin is positive for carcinoma; proximal and distal margins are not involved - Lymphovascular invasion is present - Metastatic carcinoma to one of fifteen lymph nodes (1/15); one tumor deposit - See oncology table B. LYMPH NODES, MESENTERIC, RESECTION: - Metastatic adenocarcinoma to one of six lymph nodes (1/6) - One tumor deposit  Addendum to note 2 involved lymph nodes (of 21 examined nodes) pT4a,pN1b MMR-normal, preserved expression of MLH1, MSH2, MSH6, PMS2   04/12/2020 Procedure   PAC placement    04/13/2020 Imaging   CT chest without contrast IMPRESSION: Interval development of bilateral pleural effusions, left slightly greater than right, with resultant bibasilar atelectasis including subtotal collapse of the left lower lobe. No evidence of intrathoracic metastatic disease, though evaluation of the collapsed parenchyma is limited. Hepatic metastasis again demonstrated.     04/16/2020 Pathology Results   FINAL MICROSCOPIC DIAGNOSIS:  A. LIVER, RIGHT LOBE, BIOPSY:  - Adenocarcinoma.  COMMENT:  The morphology is compatible with the provided clinical history of colorectal carcinoma.    05/23/2020 -  Chemotherapy   FOLFIRINOX q2weeks for 3-6 months starting 05/23/20. Avastin was added with C2      CURRENT THERAPY:  FOLFIRINOX q2weeks for 3-6 months starting 05/23/20. Avastin was added with C2  INTERVAL HISTORY:  Stephanie [name] is here for a follow up and treatment. She presents to the clinic alone. She notes after her C2 chemo she feels she had stomach ulcer due to severe pain after eating. She was given Protonix by PA Eliseo Squires and since taking it her pain resolved. She notes also having mild nausea and diarrhea which was manageable. PA Eliseo Squires noted she may have constipation and she took Magnesium citrate and this was  resolved but had diarrhea. She was overall able to recover well. She takes Tramadol as needed due to her incision pain or bone pain. She notes she stopped working from home given her troubles to work with cold sensitivity. She has applied for disability and has been approved. She has also applied for social security, medicaid and [employer].     REVIEW OF SYSTEMS:   Constitutional: Denies fevers, chills or abnormal weight loss Eyes: Denies blurriness of vision Ears, nose, mouth, throat, and face: Denies mucositis or sore throat Respiratory: Denies cough, dyspnea or wheezes Cardiovascular: Denies palpitation, chest discomfort or lower extremity swelling Gastrointestinal:  Denies nausea, heartburn or change in bowel habits Skin: Denies abnormal skin rashes Lymphatics: Denies new lymphadenopathy or easy bruising Neurological:Denies numbness, tingling or new weaknesses Behavioral/Psych: Mood is stable, no new changes  All other systems were reviewed with the patient and are negative.  MEDICAL HISTORY:  Past Medical History:  Diagnosis Date  . BV (bacterial vaginosis)   . Lactose intolerance 03/12/2020  . UTI (lower urinary tract infection)     SURGICAL HISTORY: Past Surgical History:  Procedure Laterality Date  . CESAREAN SECTION    . CYSTOSCOPY WITH STENT PLACEMENT  04/04/2020   Procedure: CYSTOSCOPY WITH STENT PLACEMENT;  Surgeon: [name], MD;  Location: [facility];  Service: General;;  . LAPAROTOMY N/A 04/04/2020   Procedure: EXPLORATORY LAPAROTOMY;  Surgeon: [name], MD;  Location: [facility];  Service: General;  Laterality: N/A;  . PORTACATH PLACEMENT Right 04/12/2020   Procedure: INSERTION PORT-A-CATH WITH ULTRASOUND;  Surgeon: Kieth Brightly Arta Bruce, MD;  Location: [facility];  Service: General;  Laterality:  Right;    I have reviewed the social history and family history with the patient and they are unchanged from previous note.  ALLERGIES:  has No Known Allergies.  MEDICATIONS:   Current Outpatient Medications  Medication Sig Dispense Refill  . diclofenac Sodium (VOLTAREN) 1 % GEL Apply 2 g topically 4 (four) times daily. 50 g 0  . dicyclomine (BENTYL) 10 MG capsule Take 1 capsule (10 mg total) by mouth 3 (three) times daily before meals. 60 capsule 0  . famotidine (PEPCID) 20 MG tablet Take 1 tablet (20 mg total) by mouth 2 (two) times daily. 60 tablet 0  . ferrous sulfate 325 (65 FE) MG tablet Take 1 tablet (325 mg total) by mouth 2 (two) times daily with a meal. 60 tablet 0  . lidocaine-prilocaine (EMLA) cream Apply to affected area once 30 g 3  . ondansetron (ZOFRAN ODT) 4 MG disintegrating tablet Take 1 tablet (4 mg total) by mouth every 8 (eight) hours as needed for nausea or vomiting. 30 tablet 0  . ondansetron (ZOFRAN) 8 MG tablet Take 1 tablet (8 mg total) by mouth 2 (two) times daily as needed. Start on day 3 after chemotherapy. 30 tablet 5  . pantoprazole (PROTONIX) 40 MG tablet Take 1 tablet (40 mg total) by mouth 2 (two) times daily. 60 tablet 5  . PARAGARD INTRAUTERINE COPPER IU 1 Device by Intrauterine route once.     . polyethylene glycol (MIRALAX / GLYCOLAX) 17 g packet Take 17 g by mouth 2 (two) times daily. 14 each 0  . potassium chloride SA (KLOR-CON) 20 MEQ tablet Take 1 tablet (20 mEq total) by mouth daily. 30 tablet 1  . prochlorperazine (COMPAZINE) 10 MG tablet Take 1 tablet (10 mg total) by mouth every 6 (six) hours as needed (Nausea or vomiting). 30 tablet 5  . senna-docusate (SENOKOT-S) 8.6-50 MG tablet Take 2 tablets by mouth 2 (two) times daily. 120 tablet 0  . traMADol (ULTRAM) 50 MG tablet Take 1 tablet (50 mg total) by mouth every 12 (twelve) hours as needed for severe pain. 30 tablet 0   No current facility-administered medications for this visit.   Facility-Administered Medications Ordered in Other Visits  Medication Dose Route Frequency Provider Last Rate Last Admin  . 0.9 %  sodium chloride infusion   Intravenous Continuous Malachy Mood, MD 20 mL/hr at 06/20/20 1257 New Bag at 06/20/20 1257  . fluorouracil (ADRUCIL) 5,000 mg in sodium chloride 0.9 % 150 mL chemo infusion  2,673 mg/m2 (Treatment Plan Recorded) Intravenous 1 day or 1 dose Malachy Mood, MD      . irinotecan (CAMPTOSAR) 300 mg in sodium chloride 0.9 % 500 mL chemo infusion  165 mg/m2 (Treatment Plan Recorded) Intravenous Once Malachy Mood, MD 515 mL/hr at 06/20/20 1322 300 mg at 06/20/20 1322  . leucovorin 374 mg in dextrose 5 % 250 mL infusion  200 mg/m2 (Treatment Plan Recorded) Intravenous Once Malachy Mood, MD      . oxaliplatin (ELOXATIN) 160 mg in dextrose 5 % 500 mL chemo infusion  85 mg/m2 (Treatment Plan Recorded) Intravenous Once Malachy Mood, MD        PHYSICAL EXAMINATION: ECOG PERFORMANCE STATUS: 1 - Symptomatic but completely ambulatory  Vitals:   06/20/20 0923  BP: 113/70  Pulse: 64  Temp: (!) 97.4 F (36.3 C)  SpO2: 99%   Filed Weights   06/20/20 0923  Weight: 156 lb 14.4 oz (71.2 kg)    Due to COVID19 we will limit  examination to appearance. Patient had no complaints.  GENERAL:alert, no distress and comfortable SKIN: skin color normal, no rashes or significant lesions EYES: normal, Conjunctiva are pink and non-injected, sclera clear  NEURO: alert & oriented x 3 with fluent speech   LABORATORY DATA:  I have reviewed the data as listed CBC Latest Ref Rng & Units 06/20/2020 06/11/2020 06/06/2020  WBC 4.0 - 10.5 K/uL 8.3 22.8(H) 7.7  Hemoglobin 12.0 - 15.0 g/dL 13.3 14.6 14.2  Hematocrit 36 - 46 % 39.9 44.2 42.4  Platelets 150 - 400 K/uL 188 147(L) 178     CMP Latest Ref Rng & Units 06/20/2020 06/11/2020 06/08/2020  Glucose 70 - 99 mg/dL 103(H) 86 121(H)  BUN 6 - 20 mg/dL $Remove'6 7 10  'UoRcrDi$ Creatinine 0.44 - 1.00 mg/dL 0.68 0.79 0.66  Sodium 135 - 145 mmol/L 139 137 138  Potassium 3.5 - 5.1 mmol/L 3.6 4.1 3.3(L)  Chloride 98 - 111 mmol/L 106 104 106  CO2 22 - 32 mmol/L $RemoveB'28 25 27  'LGMWdnWG$ Calcium 8.9 - 10.3 mg/dL 8.6(L) 9.2 8.2(L)  Total Protein 6.5 - 8.1 g/dL  6.5 7.5 6.0(L)  Total Bilirubin 0.3 - 1.2 mg/dL <0.2(L) 0.3 <0.2(L)  Alkaline Phos 38 - 126 U/L 76 113 62  AST 15 - 41 U/L 12(L) 17 17  ALT 0 - 44 U/L $Remo'12 15 14      'feLpA$ RADIOGRAPHIC STUDIES: I have personally reviewed the radiological images as listed and agreed with the findings in the report. No results found.   ASSESSMENT & PLAN:  Stephanie [name] is a 39 y.o. 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 Barry Dienes on 04/04/20. She was found to have perforation and positive radial margin.Liver biopsy on 04/16/2020 confirmed metastatic disease from her colon cancer -Work up is consistent with stage IV colon cancer s/p surgical resection of the primary tumor with oligo liver metastasis, overall the disease burden appears to be low.  -I started her on first line chemo with FOLFOXIRI q2 weeks for3-6 months beginning 05/23/20. Avastin was added with C2.  -If she has dramatic response with limited residual metastatic disease and no other distant metastasis, she may be a candidate for liver resection or targeted liver therapy such as ablation.  -Her FO was K-ras mutation (+), she is not a candidate for EGFR inhibitor.  -S/p C2 she is moderately tolerating treatment with mild nausea, diarrhea and constipation along with cold sensitivity and Acid reflux.  -Labs reviewed and adequate to proceed with C3 FOLFIRINOX and Avastin today.  -Plan to scan her after 3 months of treatment  -F/u in 2 weeks.    2. Mild nausea, diarrhea/constipation, Acid Reflux, secondary to chemo  -S/p C2 she was able to manage mild nausea, diarrhea and constipation -She can use Zofran or compazine as needed.  -She can use Miralax for constipation and imodium for diarrhea as needed. I reviewed overuse can lead  to either side and use low dose initially.  -For Acid Reflux, will continue Protonix.  -For cold sensitivity from oxaliplatin, I encouraged her to stay warm.    3. Abdominal pain from surgery  -She has tramadol which she uses as needed, not daily, for abdominal pain and occasional bone pain.   4.Anemia, secondary to #1 and iron deficiency from GI Blood loss. Resolved s/p IV Feraheme in  02/2020 and 05/11/20.   5. Financial assistance  -She notes she stopped working from home given her troubles to work with cold sensitivity.  -She has applied for disability and has been approved.  -She has also applied for social security, medicaid and [employer]. 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    Plan: -I refilled Tramadol today -Labs reviewed and adequate to proceed with C3 FOLFIRINOX and avastin today at same dose, Udenyca on day 3 -Lab, flush, f/u and FOLFIRINOX and avastin in 2, 4, 6 weeks   No problem-specific Assessment & Plan notes found for this encounter.   No orders of the defined types were placed in this encounter.  All questions were answered. The patient knows to call the clinic with any problems, questions or concerns. No barriers to learning was detected. The total time spent in the appointment was 30 minutes.     [name], MD 06/20/2020   I, Joslyn Devon, am acting as scribe for [name], MD.   I have reviewed the above documentation for accuracy and completeness, and I agree with the above.

## 2020-06-19 ENCOUNTER — Other Ambulatory Visit: Payer: Self-pay

## 2020-06-19 ENCOUNTER — Telehealth: Payer: Self-pay

## 2020-06-19 DIAGNOSIS — C19 Malignant neoplasm of rectosigmoid junction: Secondary | ICD-10-CM

## 2020-06-19 MED ORDER — PROCHLORPERAZINE MALEATE 10 MG PO TABS: 10.0000 mg | ORAL_TABLET | Freq: Four times a day (QID) | ORAL | 5 refills | Status: DC | PRN

## 2020-06-19 MED ORDER — ONDANSETRON HCL 8 MG PO TABS: 8.0000 mg | ORAL_TABLET | Freq: Two times a day (BID) | ORAL | 5 refills | Status: DC | PRN

## 2020-06-19 NOTE — Telephone Encounter (Signed)
Stephanie [name] left vm requesting refills for compazine, zofran and tramadol. I filled zofran and compazine.

## 2020-06-20 ENCOUNTER — Encounter: Payer: Self-pay | Admitting: Hematology

## 2020-06-20 ENCOUNTER — Inpatient Hospital Stay: Payer: BC Managed Care – PPO

## 2020-06-20 ENCOUNTER — Inpatient Hospital Stay: Payer: BC Managed Care – PPO | Admitting: Hematology

## 2020-06-20 ENCOUNTER — Other Ambulatory Visit: Payer: Self-pay

## 2020-06-20 VITALS — BP 113/70 | HR 64 | Temp 97.4°F | Ht 69.0 in | Wt 156.9 lb

## 2020-06-20 DIAGNOSIS — C19 Malignant neoplasm of rectosigmoid junction: Secondary | ICD-10-CM

## 2020-06-20 DIAGNOSIS — R252 Cramp and spasm: Secondary | ICD-10-CM

## 2020-06-20 DIAGNOSIS — Z95828 Presence of other vascular implants and grafts: Secondary | ICD-10-CM

## 2020-06-20 LAB — CMP (CANCER CENTER ONLY)
ALT: 12 U/L (ref 0–44)
AST: 12 U/L — ABNORMAL LOW (ref 15–41)
Albumin: 3.5 g/dL (ref 3.5–5.0)
Alkaline Phosphatase: 76 U/L (ref 38–126)
Anion gap: 5 (ref 5–15)
BUN: 6 mg/dL (ref 6–20)
CO2: 28 mmol/L (ref 22–32)
Calcium: 8.6 mg/dL — ABNORMAL LOW (ref 8.9–10.3)
Chloride: 106 mmol/L (ref 98–111)
Creatinine: 0.68 mg/dL (ref 0.44–1.00)
GFR, Est AFR Am: >60 mL/min (ref >60)
GFR, Estimated: >60 mL/min (ref >60)
Glucose, Bld: 103 mg/dL — ABNORMAL HIGH (ref 70–99)
Potassium: 3.6 mmol/L (ref 3.5–5.1)
Sodium: 139 mmol/L (ref 135–145)
Total Bilirubin: <0.2 mg/dL — ABNORMAL LOW (ref 0.3–1.2)
Total Protein: 6.5 g/dL (ref 6.5–8.1)

## 2020-06-20 LAB — TOTAL PROTEIN, URINE DIPSTICK: Protein, ur: <30 mg/dL

## 2020-06-20 LAB — CBC WITH DIFFERENTIAL (CANCER CENTER ONLY)
Abs Immature Granulocytes: 0.03 10*3/uL (ref 0.00–0.07)
Basophils Absolute: 0.1 10*3/uL (ref 0.0–0.1)
Basophils Relative: 1 %
Eosinophils Absolute: 0.1 10*3/uL (ref 0.0–0.5)
Eosinophils Relative: 1 %
HCT: 39.9 % (ref 36.0–46.0)
Hemoglobin: 13.3 g/dL (ref 12.0–15.0)
Immature Granulocytes: 0 %
Lymphocytes Relative: 18 %
Lymphs Abs: 1.5 10*3/uL (ref 0.7–4.0)
MCH: 28.7 pg (ref 26.0–34.0)
MCHC: 33.3 g/dL (ref 30.0–36.0)
MCV: 86.2 fL (ref 80.0–100.0)
Monocytes Absolute: 0.9 10*3/uL (ref 0.1–1.0)
Monocytes Relative: 11 %
Neutro Abs: 5.7 10*3/uL (ref 1.7–7.7)
Neutrophils Relative %: 69 %
Platelet Count: 188 10*3/uL (ref 150–400)
RBC: 4.63 MIL/uL (ref 3.87–5.11)
RDW: 15.9 % — ABNORMAL HIGH (ref 11.5–15.5)
WBC Count: 8.3 10*3/uL (ref 4.0–10.5)
nRBC: 0 % (ref 0.0–0.2)

## 2020-06-20 LAB — MAGNESIUM: Magnesium: 1.9 mg/dL (ref 1.7–2.4)

## 2020-06-20 LAB — PREGNANCY, URINE: Preg Test, Ur: NEGATIVE

## 2020-06-20 MED ORDER — SODIUM CHLORIDE 0.9 % IV SOLN
2673.0000 mg/m2 | INTRAVENOUS | Status: DC
  Administered 2020-06-20: 5000 mg via INTRAVENOUS
  Filled 2020-06-20: qty 100

## 2020-06-20 MED ORDER — PALONOSETRON HCL INJECTION 0.25 MG/5ML
0.2500 mg | Freq: Once | INTRAVENOUS | Status: AC
  Administered 2020-06-20: 0.25 mg via INTRAVENOUS

## 2020-06-20 MED ORDER — ATROPINE SULFATE 1 MG/ML IJ SOLN
0.5000 mg | Freq: Once | INTRAMUSCULAR | Status: AC | PRN
  Administered 2020-06-20: 0.5 mg via INTRAVENOUS

## 2020-06-20 MED ORDER — SODIUM CHLORIDE 0.9 % IV SOLN
150.0000 mg | Freq: Once | INTRAVENOUS | Status: AC
  Administered 2020-06-20: 150 mg via INTRAVENOUS
  Filled 2020-06-20: qty 150

## 2020-06-20 MED ORDER — DEXTROSE 5 % IV SOLN
Freq: Once | INTRAVENOUS | Status: AC
  Filled 2020-06-20: qty 250

## 2020-06-20 MED ORDER — SODIUM CHLORIDE 0.9 % IV SOLN
5.0000 mg/kg | Freq: Once | INTRAVENOUS | Status: AC
  Administered 2020-06-20: 350 mg via INTRAVENOUS
  Filled 2020-06-20: qty 14

## 2020-06-20 MED ORDER — OXALIPLATIN CHEMO INJECTION 100 MG/20ML
85.0000 mg/m2 | Freq: Once | INTRAVENOUS | Status: AC
  Administered 2020-06-20: 160 mg via INTRAVENOUS
  Filled 2020-06-20: qty 32

## 2020-06-20 MED ORDER — TRAMADOL HCL 50 MG PO TABS
50.0000 mg | ORAL_TABLET | Freq: Two times a day (BID) | ORAL | 0 refills | Status: DC | PRN
Start: 2020-06-20 — End: 2020-08-10

## 2020-06-20 MED ORDER — SODIUM CHLORIDE 0.9 % IV SOLN
INTRAVENOUS | Status: DC
  Filled 2020-06-20: qty 250

## 2020-06-20 MED ORDER — PALONOSETRON HCL INJECTION 0.25 MG/5ML
INTRAVENOUS | Status: AC
  Filled 2020-06-20: qty 5

## 2020-06-20 MED ORDER — SODIUM CHLORIDE 0.9% FLUSH
10.0000 mL | Freq: Once | INTRAVENOUS | Status: AC
  Administered 2020-06-20: 10 mL
  Filled 2020-06-20: qty 10

## 2020-06-20 MED ORDER — LEUCOVORIN CALCIUM INJECTION 350 MG
200.0000 mg/m2 | Freq: Once | INTRAVENOUS | Status: AC
  Administered 2020-06-20: 374 mg via INTRAVENOUS
  Filled 2020-06-20: qty 18.7

## 2020-06-20 MED ORDER — ATROPINE SULFATE 1 MG/ML IJ SOLN
INTRAMUSCULAR | Status: AC
  Filled 2020-06-20: qty 1

## 2020-06-20 MED ORDER — SODIUM CHLORIDE 0.9 % IV SOLN
165.0000 mg/m2 | Freq: Once | INTRAVENOUS | Status: AC
  Administered 2020-06-20: 300 mg via INTRAVENOUS
  Filled 2020-06-20: qty 15

## 2020-06-20 MED ORDER — SODIUM CHLORIDE 0.9 % IV SOLN
10.0000 mg | Freq: Once | INTRAVENOUS | Status: AC
  Administered 2020-06-20: 10 mg via INTRAVENOUS
  Filled 2020-06-20: qty 10

## 2020-06-20 NOTE — Progress Notes (Signed)
Dose of Bevacizumab clarified w/ Dr. Burr Medico; she'd like 350 mg. Use most recent weight so dose is not > 10% diff.  Kennith Center, Pharm.D., CPP 06/20/2020@12 :00 PM

## 2020-06-20 NOTE — Patient Instructions (Signed)
[facility] Discharge Instructions for Patients Receiving Chemotherapy  Today you received the following chemotherapy agents oxaliplatin, irinotecan, leucovorin, and fluorouracil.  To help prevent nausea and vomiting after your treatment, we encourage you to take your nausea medication as directed.   If you develop nausea and vomiting that is not controlled by your nausea medication, call the clinic.   BELOW ARE SYMPTOMS THAT SHOULD BE REPORTED IMMEDIATELY:  *FEVER GREATER THAN 100.5 F  *CHILLS WITH OR WITHOUT FEVER  NAUSEA AND VOMITING THAT IS NOT CONTROLLED WITH YOUR NAUSEA MEDICATION  *UNUSUAL SHORTNESS OF BREATH  *UNUSUAL BRUISING OR BLEEDING  TENDERNESS IN MOUTH AND THROAT WITH OR WITHOUT PRESENCE OF ULCERS  *URINARY PROBLEMS  *BOWEL PROBLEMS  UNUSUAL RASH Items with * indicate a potential emergency and should be followed up as soon as possible.  Feel free to call the clinic should you have any questions or concerns. The clinic phone number is [phone].  Please show the CHEMO ALERT CARD at check-in to the Emergency Department and triage nurse.   

## 2020-06-20 NOTE — Patient Instructions (Signed)

## 2020-06-21 ENCOUNTER — Encounter: Payer: Self-pay | Admitting: Internal Medicine

## 2020-06-21 ENCOUNTER — Telehealth: Payer: Self-pay | Admitting: Hematology

## 2020-06-21 NOTE — Telephone Encounter (Signed)
Scheduled per 9/22 los. Messaged RN Santiago Glad and provider added avastin with chemo. Rescheduled all chemo to 6hrs and added 11/3 appts. Noted to give pt appt calendar.

## 2020-06-22 ENCOUNTER — Inpatient Hospital Stay: Payer: BC Managed Care – PPO

## 2020-06-22 ENCOUNTER — Other Ambulatory Visit: Payer: Self-pay

## 2020-06-22 DIAGNOSIS — Z95828 Presence of other vascular implants and grafts: Secondary | ICD-10-CM

## 2020-06-22 DIAGNOSIS — C19 Malignant neoplasm of rectosigmoid junction: Secondary | ICD-10-CM | POA: Diagnosis not present

## 2020-06-22 MED ORDER — PEGFILGRASTIM-CBQV 6 MG/0.6ML ~~LOC~~ SOSY
6.0000 mg | PREFILLED_SYRINGE | Freq: Once | SUBCUTANEOUS | Status: AC
  Administered 2020-06-22: 6 mg via SUBCUTANEOUS

## 2020-06-22 MED ORDER — SODIUM CHLORIDE 0.9% FLUSH
10.0000 mL | Freq: Once | INTRAVENOUS | Status: AC
  Administered 2020-06-22: 10 mL
  Filled 2020-06-22: qty 10

## 2020-06-22 MED ORDER — HEPARIN SOD (PORK) LOCK FLUSH 100 UNIT/ML IV SOLN
500.0000 [IU] | Freq: Once | INTRAVENOUS | Status: AC
  Administered 2020-06-22: 500 [IU]
  Filled 2020-06-22: qty 5

## 2020-06-22 NOTE — Patient Instructions (Signed)
Pegfilgrastim injection What is this medicine? PEGFILGRASTIM (PEG fil gra stim) is a long-acting granulocyte colony-stimulating factor that stimulates the growth of neutrophils, a type of white blood cell important in the body's fight against infection. It is used to reduce the incidence of fever and infection in patients with certain types of cancer who are receiving chemotherapy that affects the bone marrow, and to increase survival after being exposed to high doses of radiation. This medicine may be used for other purposes; ask your health care provider or pharmacist if you have questions. COMMON BRAND NAME(S): Fulphila, Neulasta, UDENYCA, Ziextenzo What should I tell my health care provider before I take this medicine? They need to know if you have any of these conditions:  kidney disease  latex allergy  ongoing radiation therapy  sickle cell disease  skin reactions to acrylic adhesives (On-Body Injector only)  an unusual or allergic reaction to pegfilgrastim, filgrastim, other medicines, foods, dyes, or preservatives  pregnant or trying to get pregnant  breast-feeding How should I use this medicine? This medicine is for injection under the skin. If you get this medicine at home, you will be taught how to prepare and give the pre-filled syringe or how to use the On-body Injector. Refer to the patient Instructions for Use for detailed instructions. Use exactly as directed. Tell your healthcare provider immediately if you suspect that the On-body Injector may not have performed as intended or if you suspect the use of the On-body Injector resulted in a missed or partial dose. It is important that you put your used needles and syringes in a special sharps container. Do not put them in a trash can. If you do not have a sharps container, call your pharmacist or healthcare provider to get one. Talk to your pediatrician regarding the use of this medicine in children. While this drug may be  prescribed for selected conditions, precautions do apply. Overdosage: If you think you have taken too much of this medicine contact a poison control center or emergency room at once. NOTE: This medicine is only for you. Do not share this medicine with others. What if I miss a dose? It is important not to miss your dose. Call your doctor or health care professional if you miss your dose. If you miss a dose due to an On-body Injector failure or leakage, a new dose should be administered as soon as possible using a single prefilled syringe for manual use. What may interact with this medicine? Interactions have not been studied. Give your health care provider a list of all the medicines, herbs, non-prescription drugs, or dietary supplements you use. Also tell them if you smoke, drink alcohol, or use illegal drugs. Some items may interact with your medicine. This list may not describe all possible interactions. Give your health care provider a list of all the medicines, herbs, non-prescription drugs, or dietary supplements you use. Also tell them if you smoke, drink alcohol, or use illegal drugs. Some items may interact with your medicine. What should I watch for while using this medicine? You may need blood work done while you are taking this medicine. If you are going to need a MRI, CT scan, or other procedure, tell your doctor that you are using this medicine (On-Body Injector only). What side effects may I notice from receiving this medicine? Side effects that you should report to your doctor or health care professional as soon as possible:  allergic reactions like skin rash, itching or hives, swelling of the   face, lips, or tongue  back pain  dizziness  fever  pain, redness, or irritation at site where injected  pinpoint red spots on the skin  red or dark-brown urine  shortness of breath or breathing problems  stomach or side pain, or pain at the  shoulder  swelling  tiredness  trouble passing urine or change in the amount of urine Side effects that usually do not require medical attention (report to your doctor or health care professional if they continue or are bothersome):  bone pain  muscle pain This list may not describe all possible side effects. Call your doctor for medical advice about side effects. You may report side effects to FDA at 1-800-FDA-1088. Where should I keep my medicine? Keep out of the reach of children. If you are using this medicine at home, you will be instructed on how to store it. Throw away any unused medicine after the expiration date on the label. NOTE: This sheet is a summary. It may not cover all possible information. If you have questions about this medicine, talk to your doctor, pharmacist, or health care provider.  2020 Elsevier/Gold Standard (2017-12-21 16:57:08)   [employer], Adult A central line is a thin, flexible tube (catheter) that is put in your vein. It can be used to:  Give you medicine.  Give you food and nutrients. Follow these instructions at home: Caring for the tube   Follow instructions from your doctor about: ? Flushing the tube with saline solution. ? Cleaning the tube and the area around it.  Only flush with clean (sterile) supplies. The supplies should be from your doctor, a pharmacy, or another place that your doctor recommends.  Before you flush the tube or clean the area around the tube: ? Wash your hands with soap and water. If you cannot use soap and water, use hand sanitizer. ? Clean the central line hub with rubbing alcohol. Caring for your skin  Keep the area where the tube was put in clean and dry.  Every day, and when changing the bandage, check the skin around the central line for: ? Redness, swelling, or pain. ? Fluid or blood. ? Warmth. ? Pus. ? A bad smell. General instructions  Keep the tube clamped, unless it is being used.  Keep  your supplies in a clean, dry location.  If you or someone else accidentally pulls on the tube, make sure: ? The bandage (dressing) is okay. ? There is no bleeding. ? The tube has not been pulled out.  Do not use scissors or sharp objects near the tube.  Do not swim or let the tube soak in a tub.  Ask your doctor what activities are safe for you. Your doctor may tell you not to lift anything or move your arm too much.  Take over-the-counter and prescription medicines only as told by your doctor.  Change bandages as told by your doctor.  Keep your bandage dry. If a bandage gets wet, have it changed right away.  Keep all follow-up visits as told by your doctor. This is important. Throwing away supplies  Throw away any syringes in a trash (disposal) container that is only for sharp items (sharps container). You can buy a sharps container from a pharmacy, or you can make one by using an empty hard plastic bottle with a cover.  Place any used bandages or infusion bags into a plastic bag. Throw that bag in the trash. Contact a doctor if:  You have any  of these where the tube was put in: ? Redness, swelling, or pain. ? Fluid or blood. ? A warm feeling. ? Pus or a bad smell. Get help right away if:  You have: ? A fever. ? Chills. ? Trouble getting enough air (shortness of breath). ? Trouble breathing. ? Pain in your chest. ? Swelling in your neck, face, chest, or arm.  You are coughing.  You feel your heart beating fast or skipping beats.  You feel dizzy or you pass out (faint).  There are red lines coming from where the tube was put in.  The area where the tube was put in is bleeding and the bleeding will not stop.  Your tube is hard to flush.  You do not get a blood return from the tube.  The tube gets loose or comes out.  The tube has a hole or a tear.  The tube leaks. Summary  A central line is a thin, flexible tube (catheter) that is put in your vein. It  can be used to take blood for lab tests or to give you medicine.  Follow instructions from your doctor about flushing and cleaning the tube.  Keep the area where the tube was put in clean and dry.  Ask your doctor what activities are safe for you. This information is not intended to replace advice given to you by your health care provider. Make sure you discuss any questions you have with your health care provider. Document Revised: 01/05/2019 Document Reviewed: 10/02/2016 Elsevier Patient Education  [place].

## 2020-06-25 ENCOUNTER — Encounter: Payer: Self-pay | Admitting: Hematology

## 2020-07-03 NOTE — Progress Notes (Signed)
[place]   Telephone:(336) [phone] Fax:(336) [phone]   Clinic Follow up Note   Patient Care Team: Patient, [name] as PCP - General (General Practice) [name], RN as Oncology Nurse Navigator [name], NP as Nurse Practitioner (Nurse Practitioner) [name], MD as Consulting Physician (Oncology) 07/04/2020  CHIEF COMPLAINT: Follow-up colon cancer  SUMMARY OF ONCOLOGIC HISTORY: Oncology History Overview Note  Cancer Staging Malignant neoplasm of rectosigmoid junction [facility]) Staging form: Colon and Rectum, AJCC 8th Edition - Pathologic stage from 04/04/2020: pT4a, pN1b, cM1 - Signed by [name], NP on 05/07/2020    Malignant neoplasm of rectosigmoid junction ([place])  03/12/2020 Initial Diagnosis   Malignant neoplasm of rectosigmoid junction ([place])   03/12/2020 Imaging   CT AP with contrast IMPRESSION: 1. Overall findings are highly concerning for colorectal carcinoma involving the sigmoid colon with an associated perforation and adjacent abscess and phlegmon formation as detailed above. Currently, no collection is amenable to percutaneous drainage given their small size and location. 2. New 2 cm mass in the right hepatic lobe concerning for metastatic disease to the liver until proven otherwise. 3. Enlarged regional lymph nodes as detailed above is concerning for nodal metastatic disease. 4. Large stool burden. 5. Prominent pelvic veins which can be seen in patients with pelvic congestion syndrome.   03/13/2020 Imaging   ABD US IMPRESSION: Approximately 2.1 x 2.4 x 2.0 cm lobular homogeneously echogenic lesion in the right hepatic dome corresponds with the abnormality seen on the prior CT scan. Sonographically, this appearance is highly suggestive of a benign hemangioma.   Recommend MRI of the abdomen with gadolinium contrast which may provide a noninvasive diagnosis of benign hemangioma.    03/13/2020 Imaging   MR ABD W/WO  CONTRAST Hepatobiliary: Diffuse low signal intensity throughout the hepatic parenchyma on T2 weighted images, presumably a consequence of recent Feraheme injection. In segment 7 of the liver (axial image 8 of series 5) there is a 2.5 x 1.9 cm well-defined lesion which is slightly T2 hyperintense. This lesion appears hyperintense on pre gadolinium T1 weighted images (likely a consequence of Feraheme). Interpretation of enhancement within the lesion is compromised by presence of Feraheme. No other hepatic lesions are confidently identified on today's examination. No intra or extrahepatic biliary ductal dilatation. Gallbladder is normal in appearance.   03/31/2020 Imaging   CT AP W contrast IMPRESSION: 1. Previously noted sigmoid colon mass appears increased in size, and again appears to be associated with a focal contained perforation which crosses the midline and has fistulized into the left adnexal region where there is now what appears to be a large left tubo-ovarian abscess, as detailed above. This is also associated with multiple enlarged lymph nodes in the pelvis measuring up to 1.2 cm in short axis and borderline enlarged retroperitoneal lymph nodes, concerning for metastatic disease. In addition, previously suspected metastatic lesion in segment 7 of the liver has enlarged. 2. Small volume of ascites. 3. Additional incidental findings, as above.   04/04/2020 Cancer Staging   Staging form: Colon and Rectum, AJCC 8th Edition - Pathologic stage from 04/04/2020: pT4a, pN1b, cM1 - Signed by [name], NP on 05/07/2020   04/04/2020 Procedure   Paracentesis, path showed no malignant cells (mixed acute and chronic inflammation present)   04/04/2020 Surgery   Open sigmoid colectomy and end colostomy by Dr. [name]   04/04/2020 Pathology Results   FINAL MICROSCOPIC DIAGNOSIS: A. COLON, RECTOSIGMOID, RESECTION: - Invasive moderately differentiated adenocarcinoma,  6 cm, involving  rectosigmoid junction - Carcinoma invades into serosal surface with perforation and associated serositis - Radial resection margin is positive for carcinoma; proximal and distal margins are not involved - Lymphovascular invasion is present - Metastatic carcinoma to one of fifteen lymph nodes (1/15); one tumor deposit - See oncology table B. LYMPH NODES, MESENTERIC, RESECTION: - Metastatic adenocarcinoma to one of six lymph nodes (1/6) - One tumor deposit  Addendum to note 2 involved lymph nodes (of 21 examined nodes) pT4a,pN1b MMR-normal, preserved expression of MLH1, MSH2, MSH6, PMS2   04/12/2020 Procedure   PAC placement    04/13/2020 Imaging   CT chest without contrast IMPRESSION: Interval development of bilateral pleural effusions, left slightly greater than right, with resultant bibasilar atelectasis including subtotal collapse of the left lower lobe. No evidence of intrathoracic metastatic disease, though evaluation of the collapsed parenchyma is limited. Hepatic metastasis again demonstrated.     04/16/2020 Pathology Results   FINAL MICROSCOPIC DIAGNOSIS:  A. LIVER, RIGHT LOBE, BIOPSY:  - Adenocarcinoma.  COMMENT:  The morphology is compatible with the provided clinical history of colorectal carcinoma.    05/23/2020 -  Chemotherapy   FOLFIRINOX q2weeks for 3-6 months starting 05/23/20. Avastin was added with C2     CURRENT THERAPY: FOLFOXIRI q2weeks for 3-6 months starting 05/23/20. Avastin was added with C2  INTERVAL HISTORY: [name] returns for f/u and treatment as scheduled. She completed C3 on 06/20/20.  She is doing well overall.  Energy level is lower in the first few days after treatment then improves.  She manages constipation with diet changes, fiber and occasionally senna/MiraLAX.  Reflux/epigastric pain is much better on dicyclomine and Pepcid, no other abdominal pain.  Denies nausea or vomiting.  Cold sensitivity lasts a week or a little more, no residual  neuropathy in the absence of cold exposure.  Otherwise she denies fever, chills, mucositis, cough, chest pain, dyspnea, leg edema, or new concerns.   MEDICAL HISTORY:  Past Medical History:  Diagnosis Date  . BV (bacterial vaginosis)   . Lactose intolerance 03/12/2020  . UTI (lower urinary tract infection)     SURGICAL HISTORY: Past Surgical History:  Procedure Laterality Date  . CESAREAN SECTION    . CYSTOSCOPY WITH STENT PLACEMENT  04/04/2020   Procedure: CYSTOSCOPY WITH STENT PLACEMENT;  Surgeon: [name], MD;  Location: [facility];  Service: General;;  . LAPAROTOMY N/A 04/04/2020   Procedure: EXPLORATORY LAPAROTOMY;  Surgeon: [name], MD;  Location: [facility];  Service: General;  Laterality: N/A;  . PORTACATH PLACEMENT Right 04/12/2020   Procedure: INSERTION PORT-A-CATH WITH ULTRASOUND;  Surgeon: Kieth Brightly Arta Bruce, MD;  Location: [facility];  Service: General;  Laterality: Right;    I have reviewed the social history and family history with the patient and they are unchanged from previous note.  ALLERGIES:  has No Known Allergies.  MEDICATIONS:  Current Outpatient Medications  Medication Sig Dispense Refill  . diclofenac Sodium (VOLTAREN) 1 % GEL Apply 2 g topically 4 (four) times daily. 50 g 0  . dicyclomine (BENTYL) 10 MG capsule Take 1 capsule (10 mg total) by mouth 3 (three) times daily before meals. 60 capsule 1  . famotidine (PEPCID) 20 MG tablet Take 1 tablet (20 mg total) by mouth 2 (two) times daily. 60 tablet 0  . ferrous sulfate 325 (65 FE) MG tablet Take 1 tablet (325 mg total) by mouth 2 (two) times daily with a meal. 60 tablet 0  . lidocaine-prilocaine (EMLA) cream Apply  to affected area once 30 g 3  . ondansetron (ZOFRAN ODT) 4 MG disintegrating tablet Take 1 tablet (4 mg total) by mouth every 8 (eight) hours as needed for nausea or vomiting. 30 tablet 0  . ondansetron (ZOFRAN) 8 MG tablet Take 1 tablet (8 mg total) by mouth 2 (two) times daily as needed. Start on  day 3 after chemotherapy. 30 tablet 5  . pantoprazole (PROTONIX) 40 MG tablet Take 1 tablet (40 mg total) by mouth 2 (two) times daily. 60 tablet 5  . PARAGARD INTRAUTERINE COPPER IU 1 Device by Intrauterine route once.     . polyethylene glycol (MIRALAX / GLYCOLAX) 17 g packet Take 17 g by mouth 2 (two) times daily. 14 each 0  . potassium chloride SA (KLOR-CON) 20 MEQ tablet Take 1 tablet (20 mEq total) by mouth daily. 30 tablet 1  . prochlorperazine (COMPAZINE) 10 MG tablet Take 1 tablet (10 mg total) by mouth every 6 (six) hours as needed (Nausea or vomiting). 30 tablet 5  . senna-docusate (SENOKOT-S) 8.6-50 MG tablet Take 2 tablets by mouth 2 (two) times daily. 120 tablet 0  . traMADol (ULTRAM) 50 MG tablet Take 1 tablet (50 mg total) by mouth every 12 (twelve) hours as needed for severe pain. 30 tablet 0   No current facility-administered medications for this visit.   Facility-Administered Medications Ordered in Other Visits  Medication Dose Route Frequency Provider Last Rate Last Admin  . atropine injection 0.5 mg  0.5 mg Intravenous Once PRN [name], MD      . bevacizumab-bvzr (ZIRABEV) 350 mg in sodium chloride 0.9 % 100 mL chemo infusion  5 mg/kg (Treatment Plan Recorded) Intravenous Once [name], MD      . dexamethasone (DECADRON) 10 mg in sodium chloride 0.9 % 50 mL IVPB  10 mg Intravenous Once [name], MD      . dextrose 5 % solution   Intravenous Once [name], MD      . dextrose 5 % solution   Intravenous Once [name], MD      . fluorouracil (ADRUCIL) 5,250 mg in sodium chloride 0.9 % 145 mL chemo infusion  2,800 mg/m2 (Treatment Plan Recorded) Intravenous 1 day or 1 dose [name], MD      . fosaprepitant (EMEND) 150 mg in sodium chloride 0.9 % 145 mL IVPB  150 mg Intravenous Once [name], MD      . irinotecan (CAMPTOSAR) 300 mg in sodium chloride 0.9 % 500 mL chemo infusion  165 mg/m2 (Treatment Plan Recorded) Intravenous Once [name], MD      . leucovorin 374 mg in  dextrose 5 % 250 mL infusion  200 mg/m2 (Treatment Plan Recorded) Intravenous Once [name], MD      . oxaliplatin (ELOXATIN) 160 mg in dextrose 5 % 500 mL chemo infusion  85 mg/m2 (Treatment Plan Recorded) Intravenous Once [name], MD      . palonosetron (ALOXI) injection 0.25 mg  0.25 mg Intravenous Once [name], MD        PHYSICAL EXAMINATION: ECOG PERFORMANCE STATUS: 1 - Symptomatic but completely ambulatory  Vitals:   07/04/20 0947  BP: 108/79  Pulse: (!) 53  Resp: 16  Temp: 99 F (37.2 C)  SpO2: 99%   Filed Weights   07/04/20 0947  Weight: 151 lb 11.2 oz (68.8 kg)    GENERAL:alert, no distress and comfortable SKIN: No rash to exposed skin EYES: sclera clear LUNGS:  normal breathing effort  HEART:  no lower extremity edema ABDOMEN: Left colostomy noted, formed brown stool in collection bag NEURO: alert & oriented x 3 with fluent speech, normal gait PAC without erythema  LABORATORY DATA:  I have reviewed the data as listed CBC Latest Ref Rng & Units 07/04/2020 06/20/2020 06/11/2020  WBC 4.0 - 10.5 K/uL 6.7 8.3 22.8(H)  Hemoglobin 12.0 - 15.0 g/dL 14.7 13.3 14.6  Hematocrit 36 - 46 % 44.4 39.9 44.2  Platelets 150 - 400 K/uL 148(L) 188 147(L)     CMP Latest Ref Rng & Units 07/04/2020 06/20/2020 06/11/2020  Glucose 70 - 99 mg/dL 94 103(H) 86  BUN 6 - 20 mg/dL $Remove'9 6 7  'nyoIqUq$ Creatinine 0.44 - 1.00 mg/dL 0.75 0.68 0.79  Sodium 135 - 145 mmol/L 139 139 137  Potassium 3.5 - 5.1 mmol/L 3.7 3.6 4.1  Chloride 98 - 111 mmol/L 106 106 104  CO2 22 - 32 mmol/L $RemoveB'27 28 25  'JVFLDiaK$ Calcium 8.9 - 10.3 mg/dL 9.6 8.6(L) 9.2  Total Protein 6.5 - 8.1 g/dL 7.4 6.5 7.5  Total Bilirubin 0.3 - 1.2 mg/dL 0.3 <0.2(L) 0.3  Alkaline Phos 38 - 126 U/L 93 76 113  AST 15 - 41 U/L 14(L) 12(L) 17  ALT 0 - 44 U/L $Remo'15 12 15      'YvZau$ RADIOGRAPHIC STUDIES: I have personally reviewed the radiological images as listed and agreed with the findings in the report. No results found.   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 Barry Dienes on 7/7/21she was found to have perforation and positive radial margin.Liver biopsy on 04/16/2020 confirmed metastatic disease from her colon cancer -We again reviewedthis isstage IV colon cancer s/p surgical resection of the primary tumor with oligo liver metastasis, overall the disease burden appears to be low. We discussed in this case when the patient is young and fit we treat aggressively to potentially cure her disease which is achievable and approximately 20% of patients with multimodality therapies. -Dr. Burr Medico is recommending systemic treatment with FOLFOXIRI q2 weeks for3-6 months.If she has dramatic response with limited residual metastatic disease and no other distant metastasis, she may be a candidate for liver resection or targeted liver therapy such as ablation -FoundationOnewhich shows K-ras mutation (+), she is not a candidate for EGFR inhibitor.  Avastin was added with cycle 2 -Plan to restage after 3 months of treatment  2.  Mild nausea, diarrhea/constipation, acid reflux secondary to chemo -After cycle 2 -Added dicyclomine and Protonix, GERD improved -Managed with supportive care at home  3. Intrabdominal abscess secondary to sigmoid colon perforation -Culture showed staph epidermidis, treated with cefepime and fagyl  4.Anemia, secondary to #1 and iron deficiency -She had mild intermittent blood in her stool for the past several months, attributed to hemorrhoids -S/p IV Feraheme in 02/2020 during hospitalization, Ferritin improved to 124 -Denies recurrent bleeding lately -Not currently on oral iron, anemia resolved after surgery. She received second dose Feraheme  on 05/11/20 -OK to resume daily women's viamin with Iron  5. Social/financial  -she has been approved for disability, and applied for SS, medicaid, and cone grant  Disposition:  Ms. Chales Salmon appears stable.  She completed 3 cycles of FOLFOX [place] and Avastin.  She tolerates treatment well overall with mild fatigue, cold sensitivity, and constipation.  Side effects  are managed with supportive care at home and diet changes.  She is able to recover and function well.  We reviewed the CBC and CMP from today.  Iron studies and CEA remain normal. No need for additional IV iron for now. She will proceed with cycle 4 FOLFOX [place] and Avastin today.  She will return for pump DC and injection on day 3, flu shot next week, then follow-up in 2 weeks with cycle 5.  The plan is to restage after cycle 6.  All questions were answered. The patient knows to call the clinic with any problems, questions or concerns. No barriers to learning were detected.      [name], NP 07/04/20

## 2020-07-04 ENCOUNTER — Inpatient Hospital Stay: Payer: BC Managed Care – PPO

## 2020-07-04 ENCOUNTER — Other Ambulatory Visit: Payer: Self-pay

## 2020-07-04 ENCOUNTER — Inpatient Hospital Stay: Payer: BC Managed Care – PPO | Attending: Nurse Practitioner | Admitting: Nurse Practitioner

## 2020-07-04 ENCOUNTER — Encounter: Payer: Self-pay | Admitting: Nurse Practitioner

## 2020-07-04 ENCOUNTER — Ambulatory Visit: Payer: BC Managed Care – PPO

## 2020-07-04 VITALS — BP 107/65 | HR 60

## 2020-07-04 VITALS — BP 108/79 | HR 53 | Temp 99.0°F | Resp 16 | Ht 69.0 in | Wt 151.7 lb

## 2020-07-04 DIAGNOSIS — K219 Gastro-esophageal reflux disease without esophagitis: Secondary | ICD-10-CM | POA: Diagnosis not present

## 2020-07-04 DIAGNOSIS — C787 Secondary malignant neoplasm of liver and intrahepatic bile duct: Secondary | ICD-10-CM | POA: Insufficient documentation

## 2020-07-04 DIAGNOSIS — Z23 Encounter for immunization: Secondary | ICD-10-CM | POA: Insufficient documentation

## 2020-07-04 DIAGNOSIS — D6481 Anemia due to antineoplastic chemotherapy: Secondary | ICD-10-CM | POA: Diagnosis not present

## 2020-07-04 DIAGNOSIS — Z5189 Encounter for other specified aftercare: Secondary | ICD-10-CM | POA: Diagnosis not present

## 2020-07-04 DIAGNOSIS — R110 Nausea: Secondary | ICD-10-CM | POA: Diagnosis not present

## 2020-07-04 DIAGNOSIS — Z95828 Presence of other vascular implants and grafts: Secondary | ICD-10-CM

## 2020-07-04 DIAGNOSIS — Z5112 Encounter for antineoplastic immunotherapy: Secondary | ICD-10-CM | POA: Insufficient documentation

## 2020-07-04 DIAGNOSIS — Z79899 Other long term (current) drug therapy: Secondary | ICD-10-CM | POA: Insufficient documentation

## 2020-07-04 DIAGNOSIS — C19 Malignant neoplasm of rectosigmoid junction: Secondary | ICD-10-CM | POA: Diagnosis not present

## 2020-07-04 DIAGNOSIS — R252 Cramp and spasm: Secondary | ICD-10-CM

## 2020-07-04 DIAGNOSIS — Z5111 Encounter for antineoplastic chemotherapy: Secondary | ICD-10-CM | POA: Diagnosis present

## 2020-07-04 LAB — CEA (IN HOUSE-CHCC): CEA (CHCC-In House): 2.88 ng/mL (ref 0.00–5.00)

## 2020-07-04 LAB — CMP (CANCER CENTER ONLY)
ALT: 15 U/L (ref 0–44)
AST: 14 U/L — ABNORMAL LOW (ref 15–41)
Albumin: 4 g/dL (ref 3.5–5.0)
Alkaline Phosphatase: 93 U/L (ref 38–126)
Anion gap: 6 (ref 5–15)
BUN: 9 mg/dL (ref 6–20)
CO2: 27 mmol/L (ref 22–32)
Calcium: 9.6 mg/dL (ref 8.9–10.3)
Chloride: 106 mmol/L (ref 98–111)
Creatinine: 0.75 mg/dL (ref 0.44–1.00)
GFR, Estimated: >60 mL/min (ref >60)
Glucose, Bld: 94 mg/dL (ref 70–99)
Potassium: 3.7 mmol/L (ref 3.5–5.1)
Sodium: 139 mmol/L (ref 135–145)
Total Bilirubin: 0.3 mg/dL (ref 0.3–1.2)
Total Protein: 7.4 g/dL (ref 6.5–8.1)

## 2020-07-04 LAB — CBC WITH DIFFERENTIAL (CANCER CENTER ONLY)
Abs Immature Granulocytes: 0.02 10*3/uL (ref 0.00–0.07)
Basophils Absolute: 0.1 10*3/uL (ref 0.0–0.1)
Basophils Relative: 1 %
Eosinophils Absolute: 0.1 10*3/uL (ref 0.0–0.5)
Eosinophils Relative: 2 %
HCT: 44.4 % (ref 36.0–46.0)
Hemoglobin: 14.7 g/dL (ref 12.0–15.0)
Immature Granulocytes: 0 %
Lymphocytes Relative: 20 %
Lymphs Abs: 1.4 10*3/uL (ref 0.7–4.0)
MCH: 28.3 pg (ref 26.0–34.0)
MCHC: 33.1 g/dL (ref 30.0–36.0)
MCV: 85.5 fL (ref 80.0–100.0)
Monocytes Absolute: 0.6 10*3/uL (ref 0.1–1.0)
Monocytes Relative: 8 %
Neutro Abs: 4.6 10*3/uL (ref 1.7–7.7)
Neutrophils Relative %: 69 %
Platelet Count: 148 10*3/uL — ABNORMAL LOW (ref 150–400)
RBC: 5.19 MIL/uL — ABNORMAL HIGH (ref 3.87–5.11)
RDW: 15.8 % — ABNORMAL HIGH (ref 11.5–15.5)
WBC Count: 6.7 10*3/uL (ref 4.0–10.5)
nRBC: 0 % (ref 0.0–0.2)

## 2020-07-04 LAB — IRON AND TIBC
Iron: 56 ug/dL (ref 41–142)
Saturation Ratios: 19 % — ABNORMAL LOW (ref 21–57)
TIBC: 300 ug/dL (ref 236–444)
UIBC: 244 ug/dL (ref 120–384)

## 2020-07-04 LAB — FERRITIN: Ferritin: 180 ng/mL (ref 11–307)

## 2020-07-04 LAB — TOTAL PROTEIN, URINE DIPSTICK: Protein, ur: NEGATIVE mg/dL

## 2020-07-04 LAB — MAGNESIUM: Magnesium: 1.9 mg/dL (ref 1.7–2.4)

## 2020-07-04 LAB — PREGNANCY, URINE: Preg Test, Ur: NEGATIVE

## 2020-07-04 MED ORDER — SODIUM CHLORIDE 0.9 % IV SOLN
2675.0000 mg/m2 | INTRAVENOUS | Status: DC
  Administered 2020-07-04: 5000 mg via INTRAVENOUS
  Filled 2020-07-04: qty 100

## 2020-07-04 MED ORDER — SODIUM CHLORIDE 0.9 % IV SOLN
5.0000 mg/kg | Freq: Once | INTRAVENOUS | Status: AC
  Administered 2020-07-04: 350 mg via INTRAVENOUS
  Filled 2020-07-04: qty 14

## 2020-07-04 MED ORDER — SODIUM CHLORIDE 0.9 % IV SOLN
165.0000 mg/m2 | Freq: Once | INTRAVENOUS | Status: AC
  Administered 2020-07-04: 300 mg via INTRAVENOUS
  Filled 2020-07-04: qty 15

## 2020-07-04 MED ORDER — SODIUM CHLORIDE 0.9 % IV SOLN
10.0000 mg | Freq: Once | INTRAVENOUS | Status: AC
  Administered 2020-07-04: 10 mg via INTRAVENOUS
  Filled 2020-07-04: qty 10

## 2020-07-04 MED ORDER — OXALIPLATIN CHEMO INJECTION 100 MG/20ML
85.0000 mg/m2 | Freq: Once | INTRAVENOUS | Status: AC
  Administered 2020-07-04: 160 mg via INTRAVENOUS
  Filled 2020-07-04: qty 32

## 2020-07-04 MED ORDER — ALTEPLASE 2 MG IJ SOLR
2.0000 mg | Freq: Once | INTRAMUSCULAR | Status: AC
  Administered 2020-07-04: 2 mg
  Filled 2020-07-04: qty 2

## 2020-07-04 MED ORDER — SODIUM CHLORIDE 0.9 % IV SOLN
150.0000 mg | Freq: Once | INTRAVENOUS | Status: AC
  Administered 2020-07-04: 150 mg via INTRAVENOUS
  Filled 2020-07-04: qty 150

## 2020-07-04 MED ORDER — SODIUM CHLORIDE 0.9% FLUSH
10.0000 mL | Freq: Once | INTRAVENOUS | Status: AC
  Administered 2020-07-04: 10 mL
  Filled 2020-07-04: qty 10

## 2020-07-04 MED ORDER — PALONOSETRON HCL INJECTION 0.25 MG/5ML
0.2500 mg | Freq: Once | INTRAVENOUS | Status: AC
  Administered 2020-07-04: 0.25 mg via INTRAVENOUS

## 2020-07-04 MED ORDER — ATROPINE SULFATE 1 MG/ML IJ SOLN
INTRAMUSCULAR | Status: AC
  Filled 2020-07-04: qty 1

## 2020-07-04 MED ORDER — SODIUM CHLORIDE 0.9 % IV SOLN: 2800.0000 mg/m2 | INTRAVENOUS | Status: DC

## 2020-07-04 MED ORDER — DEXTROSE 5 % IV SOLN
Freq: Once | INTRAVENOUS | Status: AC
  Filled 2020-07-04: qty 250

## 2020-07-04 MED ORDER — ALTEPLASE 2 MG IJ SOLR
INTRAMUSCULAR | Status: AC
  Filled 2020-07-04: qty 2

## 2020-07-04 MED ORDER — PALONOSETRON HCL INJECTION 0.25 MG/5ML
INTRAVENOUS | Status: AC
  Filled 2020-07-04: qty 5

## 2020-07-04 MED ORDER — DICYCLOMINE HCL 10 MG PO CAPS: 10.0000 mg | ORAL_CAPSULE | Freq: Three times a day (TID) | ORAL | 1 refills | Status: DC

## 2020-07-04 MED ORDER — DEXTROSE 5 % IV SOLN
Freq: Once | INTRAVENOUS | Status: DC
  Filled 2020-07-04: qty 250

## 2020-07-04 MED ORDER — SODIUM CHLORIDE 0.9 % IV SOLN
Freq: Once | INTRAVENOUS | Status: AC
  Filled 2020-07-04: qty 250

## 2020-07-04 MED ORDER — LEUCOVORIN CALCIUM INJECTION 350 MG
200.0000 mg/m2 | Freq: Once | INTRAVENOUS | Status: AC
  Administered 2020-07-04: 374 mg via INTRAVENOUS
  Filled 2020-07-04: qty 18.7

## 2020-07-04 MED ORDER — ATROPINE SULFATE 1 MG/ML IJ SOLN
0.5000 mg | Freq: Once | INTRAMUSCULAR | Status: AC | PRN
  Administered 2020-07-04: 0.5 mg via INTRAVENOUS

## 2020-07-04 NOTE — Patient Instructions (Signed)
[facility] Discharge Instructions for Patients Receiving Chemotherapy  Today you received the following chemotherapy agents Bevacizumab, Irinotecan, Oxaliplatin, Leucovorin, and 5FU  To help prevent nausea and vomiting after your treatment, we encourage you to take your nausea medication as directed   If you develop nausea and vomiting that is not controlled by your nausea medication, call the clinic.   BELOW ARE SYMPTOMS THAT SHOULD BE REPORTED IMMEDIATELY:  *FEVER GREATER THAN 100.5 F  *CHILLS WITH OR WITHOUT FEVER  NAUSEA AND VOMITING THAT IS NOT CONTROLLED WITH YOUR NAUSEA MEDICATION  *UNUSUAL SHORTNESS OF BREATH  *UNUSUAL BRUISING OR BLEEDING  TENDERNESS IN MOUTH AND THROAT WITH OR WITHOUT PRESENCE OF ULCERS  *URINARY PROBLEMS  *BOWEL PROBLEMS  UNUSUAL RASH Items with * indicate a potential emergency and should be followed up as soon as possible.  Feel free to call the clinic should you have any questions or concerns. The clinic phone number is [phone].  Please show the [facility] at check-in to the Emergency Department and triage nurse.

## 2020-07-05 ENCOUNTER — Telehealth: Payer: Self-pay | Admitting: Nurse Practitioner

## 2020-07-05 NOTE — Telephone Encounter (Signed)
Scheduled per 10/6 los. Pt is aware of appt time and date.

## 2020-07-06 ENCOUNTER — Other Ambulatory Visit: Payer: Self-pay

## 2020-07-06 ENCOUNTER — Inpatient Hospital Stay: Payer: BC Managed Care – PPO

## 2020-07-06 VITALS — BP 110/75 | HR 61 | Temp 98.6°F | Resp 18

## 2020-07-06 DIAGNOSIS — Z95828 Presence of other vascular implants and grafts: Secondary | ICD-10-CM

## 2020-07-06 DIAGNOSIS — C19 Malignant neoplasm of rectosigmoid junction: Secondary | ICD-10-CM | POA: Diagnosis not present

## 2020-07-06 MED ORDER — PEGFILGRASTIM-CBQV 6 MG/0.6ML ~~LOC~~ SOSY
PREFILLED_SYRINGE | SUBCUTANEOUS | Status: AC
  Filled 2020-07-06: qty 0.6

## 2020-07-06 MED ORDER — SODIUM CHLORIDE 0.9% FLUSH
10.0000 mL | Freq: Once | INTRAVENOUS | Status: AC
  Administered 2020-07-06: 10 mL
  Filled 2020-07-06: qty 10

## 2020-07-06 MED ORDER — PEGFILGRASTIM-CBQV 6 MG/0.6ML ~~LOC~~ SOSY
6.0000 mg | PREFILLED_SYRINGE | Freq: Once | SUBCUTANEOUS | Status: AC
  Administered 2020-07-06: 6 mg via SUBCUTANEOUS

## 2020-07-06 MED ORDER — HEPARIN SOD (PORK) LOCK FLUSH 100 UNIT/ML IV SOLN
250.0000 [IU] | Freq: Once | INTRAVENOUS | Status: AC
  Administered 2020-07-06: 500 [IU]
  Filled 2020-07-06: qty 5

## 2020-07-06 NOTE — Patient Instructions (Addendum)
Implanted Port Home Guide An implanted port is a device that is placed under the skin. It is usually placed in the chest. The device can be used to give IV medicine, to take blood, or for dialysis. You may have an implanted port if:  You need IV medicine that would be irritating to the small veins in your hands or arms.  You need IV medicines, such as antibiotics, for a long period of time.  You need IV nutrition for a long period of time.  You need dialysis. Having a port means that your health care provider will not need to use the veins in your arms for these procedures. You may have fewer limitations when using a port than you would if you used other types of long-term IVs, and you will likely be able to return to normal activities after your incision heals. An implanted port has two main parts:  Reservoir. The reservoir is the part where a needle is inserted to give medicines or draw blood. The reservoir is round. After it is placed, it appears as a small, raised area under your skin.  Catheter. The catheter is a thin, flexible tube that connects the reservoir to a vein. Medicine that is inserted into the reservoir goes into the catheter and then into the vein. How is my port accessed? To access your port:  A numbing cream may be placed on the skin over the port site.  Your health care provider will put on a mask and sterile gloves.  The skin over your port will be cleaned carefully with a germ-killing soap and allowed to dry.  Your health care provider will gently pinch the port and insert a needle into it.  Your health care provider will check for a blood return to make sure the port is in the vein and is not clogged.  If your port needs to remain accessed to get medicine continuously (constant infusion), your health care provider will place a clear bandage (dressing) over the needle site. The dressing and needle will need to be changed every week, or as told by your health care  provider. What is flushing? Flushing helps keep the port from getting clogged. Follow instructions from your health care provider about how and when to flush the port. Ports are usually flushed with saline solution or a medicine called heparin. The need for flushing will depend on how the port is used:  If the port is only used from time to time to give medicines or draw blood, the port may need to be flushed: ? Before and after medicines have been given. ? Before and after blood has been drawn. ? As part of routine maintenance. Flushing may be recommended every 4-6 weeks.  If a constant infusion is running, the port may not need to be flushed.  Throw away any syringes in a disposal container that is meant for sharp items (sharps container). You can buy a sharps container from a pharmacy, or you can make one by using an empty hard plastic bottle with a cover. How long will my port stay implanted? The port can stay in for as long as your health care provider thinks it is needed. When it is time for the port to come out, a surgery will be done to remove it. The surgery will be similar to the procedure that was done to put the port in. Follow these instructions at home:   Flush your port as told by your health care provider.    If you need an infusion over several days, follow instructions from your health care provider about how to take care of your port site. Make sure you: ? Wash your hands with soap and water before you change your dressing. If soap and water are not available, use alcohol-based hand sanitizer. ? Change your dressing as told by your health care provider. ? Place any used dressings or infusion bags into a plastic bag. Throw that bag in the trash. ? Keep the dressing that covers the needle clean and dry. Do not get it wet. ? Do not use scissors or sharp objects near the tube. ? Keep the tube clamped, unless it is being used.  Check your port site every day for signs of  infection. Check for: ? Redness, swelling, or pain. ? Fluid or blood. ? Pus or a bad smell.  Protect the skin around the port site. ? Avoid wearing bra straps that rub or irritate the site. ? Protect the skin around your port from seat belts. Place a soft pad over your chest if needed.  Bathe or shower as told by your health care provider. The site may get wet as long as you are not actively receiving an infusion.  Return to your normal activities as told by your health care provider. Ask your health care provider what activities are safe for you.  Carry a medical alert card or wear a medical alert bracelet at all times. This will let health care providers know that you have an implanted port in case of an emergency. Get help right away if:  You have redness, swelling, or pain at the port site.  You have fluid or blood coming from your port site.  You have pus or a bad smell coming from the port site.  You have a fever. Summary  Implanted ports are usually placed in the chest for long-term IV access.  Follow instructions from your health care provider about flushing the port and changing bandages (dressings).  Take care of the area around your port by avoiding clothing that puts pressure on the area, and by watching for signs of infection.  Protect the skin around your port from seat belts. Place a soft pad over your chest if needed.  Get help right away if you have a fever or you have redness, swelling, pain, drainage, or a bad smell at the port site. This information is not intended to replace advice given to you by your health care provider. Make sure you discuss any questions you have with your health care provider. Document Revised: 01/07/2019 Document Reviewed: 10/18/2016 Elsevier Patient Education  2020 [employer] Pegfilgrastim injection What is this medicine? PEGFILGRASTIM (PEG fil gra stim) is a long-acting granulocyte colony-stimulating factor that stimulates the  growth of neutrophils, a type of white blood cell important in the body's fight against infection. It is used to reduce the incidence of fever and infection in patients with certain types of cancer who are receiving chemotherapy that affects the bone marrow, and to increase survival after being exposed to high doses of radiation. This medicine may be used for other purposes; ask your health care provider or pharmacist if you have questions. COMMON BRAND NAME(S): Fulphila, Neulasta, UDENYCA, Ziextenzo What should I tell my health care provider before I take this medicine? They need to know if you have any of these conditions:  kidney disease  latex allergy  ongoing radiation therapy  sickle cell disease  skin reactions to acrylic adhesives (On-Body   Injector only)  an unusual or allergic reaction to pegfilgrastim, filgrastim, other medicines, foods, dyes, or preservatives  pregnant or trying to get pregnant  breast-feeding How should I use this medicine? This medicine is for injection under the skin. If you get this medicine at home, you will be taught how to prepare and give the pre-filled syringe or how to use the On-body Injector. Refer to the patient Instructions for Use for detailed instructions. Use exactly as directed. Tell your healthcare provider immediately if you suspect that the On-body Injector may not have performed as intended or if you suspect the use of the On-body Injector resulted in a missed or partial dose. It is important that you put your used needles and syringes in a special sharps container. Do not put them in a trash can. If you do not have a sharps container, call your pharmacist or healthcare provider to get one. Talk to your pediatrician regarding the use of this medicine in children. While this drug may be prescribed for selected conditions, precautions do apply. Overdosage: If you think you have taken too much of this medicine contact a poison control center or  emergency room at once. NOTE: This medicine is only for you. Do not share this medicine with others. What if I miss a dose? It is important not to miss your dose. Call your doctor or health care professional if you miss your dose. If you miss a dose due to an On-body Injector failure or leakage, a new dose should be administered as soon as possible using a single prefilled syringe for manual use. What may interact with this medicine? Interactions have not been studied. Give your health care provider a list of all the medicines, herbs, non-prescription drugs, or dietary supplements you use. Also tell them if you smoke, drink alcohol, or use illegal drugs. Some items may interact with your medicine. This list may not describe all possible interactions. Give your health care provider a list of all the medicines, herbs, non-prescription drugs, or dietary supplements you use. Also tell them if you smoke, drink alcohol, or use illegal drugs. Some items may interact with your medicine. What should I watch for while using this medicine? You may need blood work done while you are taking this medicine. If you are going to need a MRI, CT scan, or other procedure, tell your doctor that you are using this medicine (On-Body Injector only). What side effects may I notice from receiving this medicine? Side effects that you should report to your doctor or health care professional as soon as possible:  allergic reactions like skin rash, itching or hives, swelling of the face, lips, or tongue  back pain  dizziness  fever  pain, redness, or irritation at site where injected  pinpoint red spots on the skin  red or dark-brown urine  shortness of breath or breathing problems  stomach or side pain, or pain at the shoulder  swelling  tiredness  trouble passing urine or change in the amount of urine Side effects that usually do not require medical attention (report to your doctor or health care  professional if they continue or are bothersome):  bone pain  muscle pain This list may not describe all possible side effects. Call your doctor for medical advice about side effects. You may report side effects to FDA at 1-800-FDA-1088. Where should I keep my medicine? Keep out of the reach of children. If you are using this medicine at home, you will be instructed   on how to store it. Throw away any unused medicine after the expiration date on the label. NOTE: This sheet is a summary. It may not cover all possible information. If you have questions about this medicine, talk to your doctor, pharmacist, or health care provider.  2020 Elsevier/[employer] Standard (2017-12-21 16:57:08)  

## 2020-07-09 ENCOUNTER — Inpatient Hospital Stay: Payer: BC Managed Care – PPO

## 2020-07-09 ENCOUNTER — Other Ambulatory Visit: Payer: Self-pay | Admitting: Nurse Practitioner

## 2020-07-09 ENCOUNTER — Other Ambulatory Visit: Payer: Self-pay

## 2020-07-09 ENCOUNTER — Telehealth: Payer: Self-pay

## 2020-07-09 DIAGNOSIS — R300 Dysuria: Secondary | ICD-10-CM

## 2020-07-09 DIAGNOSIS — C19 Malignant neoplasm of rectosigmoid junction: Secondary | ICD-10-CM | POA: Diagnosis not present

## 2020-07-09 DIAGNOSIS — Z23 Encounter for immunization: Secondary | ICD-10-CM

## 2020-07-09 LAB — URINALYSIS, COMPLETE (UACMP) WITH MICROSCOPIC
Bilirubin Urine: NEGATIVE
Glucose, UA: NEGATIVE mg/dL
Ketones, ur: NEGATIVE mg/dL
Nitrite: NEGATIVE
Protein, ur: NEGATIVE mg/dL
Specific Gravity, Urine: 1.011 (ref 1.005–1.030)
WBC, UA: >50 WBC/hpf — ABNORMAL HIGH (ref 0–5)
pH: 6 (ref 5.0–8.0)

## 2020-07-09 MED ORDER — FLUCONAZOLE 150 MG PO TABS: 150.0000 mg | ORAL_TABLET | Freq: Every day | ORAL | 0 refills | Status: DC

## 2020-07-09 MED ORDER — CIPROFLOXACIN HCL 500 MG PO TABS: 500.0000 mg | ORAL_TABLET | Freq: Two times a day (BID) | ORAL | 0 refills | Status: DC

## 2020-07-09 MED ORDER — INFLUENZA VAC SPLIT QUAD 0.5 ML IM SUSY
PREFILLED_SYRINGE | INTRAMUSCULAR | Status: AC
  Filled 2020-07-09: qty 0.5

## 2020-07-09 MED ORDER — INFLUENZA VAC SPLIT QUAD 0.5 ML IM SUSY
0.5000 mL | PREFILLED_SYRINGE | Freq: Once | INTRAMUSCULAR | Status: AC
  Administered 2020-07-09: 0.5 mL via INTRAMUSCULAR

## 2020-07-09 NOTE — Telephone Encounter (Signed)
Stephanie [name] called requesting results of u/a c and s.

## 2020-07-10 ENCOUNTER — Telehealth: Payer: Self-pay | Admitting: *Deleted

## 2020-07-10 LAB — URINE CULTURE: Culture: NO GROWTH

## 2020-07-10 NOTE — Telephone Encounter (Signed)
-----   Message from [name], NP sent at 07/10/2020  3:42 PM EDT ----- Urine culture is negative, please call to check on her symptoms. Thanks, Regan Rakers

## 2020-07-10 NOTE — Telephone Encounter (Signed)
Per Cira Rue, NP, called pt with message below, pt stated she's started medication and is feeling better. Advised to call with any other questions or concerns. Pt verbalized understanding

## 2020-07-13 NOTE — Progress Notes (Signed)
[place]   [phone] [phone]   Clinic Follow up Note   Patient Care Team: Patient, [name] as PCP - General (General Practice) [name], RN as Oncology Nurse Navigator [name], NP as Nurse Practitioner (Nurse Practitioner) [name], MD as Consulting Physician (Oncology)  Date of Service:  07/18/2020  CHIEF COMPLAINT:  f/u of metastatic colon cancer  SUMMARY OF ONCOLOGIC HISTORY: Oncology History Overview Note  Cancer Staging Malignant neoplasm of rectosigmoid junction [facility]) Staging form: Colon and Rectum, AJCC 8th Edition - Pathologic stage from 04/04/2020: pT4a, pN1b, cM1 - Signed by [name], NP on 05/07/2020    Malignant neoplasm of rectosigmoid junction ([place])  03/12/2020 Initial Diagnosis   Malignant neoplasm of rectosigmoid junction ([place])   03/12/2020 Imaging   CT AP with contrast IMPRESSION: 1. Overall findings are highly concerning for colorectal carcinoma involving the sigmoid colon with an associated perforation and adjacent abscess and phlegmon formation as detailed above. Currently, no collection is amenable to percutaneous drainage given their small size and location. 2. New 2 cm mass in the right hepatic lobe concerning for metastatic disease to the liver until proven otherwise. 3. Enlarged regional lymph nodes as detailed above is concerning for nodal metastatic disease. 4. Large stool burden. 5. Prominent pelvic veins which can be seen in patients with pelvic congestion syndrome.   03/13/2020 Imaging   ABD US IMPRESSION: Approximately 2.1 x 2.4 x 2.0 cm lobular homogeneously echogenic lesion in the right hepatic dome corresponds with the abnormality seen on the prior CT scan. Sonographically, this appearance is highly suggestive of a benign hemangioma.   Recommend MRI of the abdomen with gadolinium contrast which may provide a noninvasive diagnosis of benign hemangioma.    03/13/2020  Imaging   MR ABD W/WO CONTRAST Hepatobiliary: Diffuse low signal intensity throughout the hepatic parenchyma on T2 weighted images, presumably a consequence of recent Feraheme injection. In segment 7 of the liver (axial image 8 of series 5) there is a 2.5 x 1.9 cm well-defined lesion which is slightly T2 hyperintense. This lesion appears hyperintense on pre gadolinium T1 weighted images (likely a consequence of Feraheme). Interpretation of enhancement within the lesion is compromised by presence of Feraheme. No other hepatic lesions are confidently identified on today's examination. No intra or extrahepatic biliary ductal dilatation. Gallbladder is normal in appearance.   03/31/2020 Imaging   CT AP W contrast IMPRESSION: 1. Previously noted sigmoid colon mass appears increased in size, and again appears to be associated with a focal contained perforation which crosses the midline and has fistulized into the left adnexal region where there is now what appears to be a large left tubo-ovarian abscess, as detailed above. This is also associated with multiple enlarged lymph nodes in the pelvis measuring up to 1.2 cm in short axis and borderline enlarged retroperitoneal lymph nodes, concerning for metastatic disease. In addition, previously suspected metastatic lesion in segment 7 of the liver has enlarged. 2. Small volume of ascites. 3. Additional incidental findings, as above.   04/04/2020 Cancer Staging   Staging form: Colon and Rectum, AJCC 8th Edition - Pathologic stage from 04/04/2020: pT4a, pN1b, cM1 - Signed by [name], NP on 05/07/2020   04/04/2020 Procedure   Paracentesis, path showed no malignant cells (mixed acute and chronic inflammation present)   04/04/2020 Surgery   Open sigmoid colectomy and end colostomy by Dr. [name]   04/04/2020 Pathology Results   FINAL MICROSCOPIC DIAGNOSIS: A.  COLON, RECTOSIGMOID, RESECTION: - Invasive moderately differentiated  adenocarcinoma, 6 cm, involving rectosigmoid junction - Carcinoma invades into serosal surface with perforation and associated serositis - Radial resection margin is positive for carcinoma; proximal and distal margins are not involved - Lymphovascular invasion is present - Metastatic carcinoma to one of fifteen lymph nodes (1/15); one tumor deposit - See oncology table B. LYMPH NODES, MESENTERIC, RESECTION: - Metastatic adenocarcinoma to one of six lymph nodes (1/6) - One tumor deposit  Addendum to note 2 involved lymph nodes (of 21 examined nodes) pT4a,pN1b MMR-normal, preserved expression of MLH1, MSH2, MSH6, PMS2   04/12/2020 Procedure   PAC placement    04/13/2020 Imaging   CT chest without contrast IMPRESSION: Interval development of bilateral pleural effusions, left slightly greater than right, with resultant bibasilar atelectasis including subtotal collapse of the left lower lobe. No evidence of intrathoracic metastatic disease, though evaluation of the collapsed parenchyma is limited. Hepatic metastasis again demonstrated.     04/16/2020 Pathology Results   FINAL MICROSCOPIC DIAGNOSIS:  A. LIVER, RIGHT LOBE, BIOPSY:  - Adenocarcinoma.  COMMENT:  The morphology is compatible with the provided clinical history of colorectal carcinoma.    05/23/2020 -  Chemotherapy   FOLFIRINOX q2weeks for 3-6 months starting 05/23/20. Bevacizumab-bvzr [name] added with C2.       CURRENT THERAPY:  FOLFIRINOX q2weeks for 3-6 months starting 05/23/20. Bevacizumab-bvzr [name] added with C2.   INTERVAL HISTORY:  Stephanie [name] is here for a follow up. She presents to the clinic alone. She notes she is doing well.  She notes since she has been on chemo she has had bacteria vaginosis twice. She has not been sexually active. She has taken cipro twice for this by NP [name] which resolved it. She notes she tries to wear cotton underwear. She notes she gets constipated before her next cycle  chemo. She was able to get relief with magnesium citrate but had diarrhea. She will try miralax. She is able to recover from treatment after 6-7 days. She has cold sensitivity for 5 days.     REVIEW OF SYSTEMS:   Constitutional: Denies fevers, chills or abnormal weight loss Eyes: Denies blurriness of vision Ears, nose, mouth, throat, and face: Denies mucositis or sore throat Respiratory: Denies cough, dyspnea or wheezes Cardiovascular: Denies palpitation, chest discomfort or lower extremity swelling Gastrointestinal:  Denies nausea, heartburn (+) Occasional constipation  Skin: Denies abnormal skin rashes Lymphatics: Denies new lymphadenopathy or easy bruising Neurological:Denies numbness, tingling or new weaknesses Behavioral/Psych: Mood is stable, no new changes  All other systems were reviewed with the patient and are negative.  MEDICAL HISTORY:  Past Medical History:  Diagnosis Date  . BV (bacterial vaginosis)   . Lactose intolerance 03/12/2020  . UTI (lower urinary tract infection)     SURGICAL HISTORY: Past Surgical History:  Procedure Laterality Date  . CESAREAN SECTION    . CYSTOSCOPY WITH STENT PLACEMENT  04/04/2020   Procedure: CYSTOSCOPY WITH STENT PLACEMENT;  Surgeon: [name], MD;  Location: [facility];  Service: General;;  . LAPAROTOMY N/A 04/04/2020   Procedure: EXPLORATORY LAPAROTOMY;  Surgeon: [name], MD;  Location: [facility];  Service: General;  Laterality: N/A;  . PORTACATH PLACEMENT Right 04/12/2020   Procedure: INSERTION PORT-A-CATH WITH ULTRASOUND;  Surgeon: Kieth Brightly Arta Bruce, MD;  Location: [facility];  Service: General;  Laterality: Right;    I have reviewed the social history and family history with the patient and they are unchanged from previous note.  ALLERGIES:  has No Known Allergies.  MEDICATIONS:  Current Outpatient Medications  Medication Sig Dispense Refill  . ciprofloxacin (CIPRO) 500 MG tablet Take 1 tablet (500 mg total) by mouth 2 (two)  times daily. 6 tablet 0  . diclofenac Sodium (VOLTAREN) 1 % GEL Apply 2 g topically 4 (four) times daily. 50 g 0  . dicyclomine (BENTYL) 10 MG capsule Take 1 capsule (10 mg total) by mouth 3 (three) times daily before meals. 60 capsule 1  . famotidine (PEPCID) 20 MG tablet Take 1 tablet (20 mg total) by mouth 2 (two) times daily. 60 tablet 0  . ferrous sulfate 325 (65 FE) MG tablet Take 1 tablet (325 mg total) by mouth 2 (two) times daily with a meal. 60 tablet 0  . fluconazole (DIFLUCAN) 150 MG tablet Take 1 tablet (150 mg total) by mouth daily. For 1 dose. Repeat 3 days later if needed 2 tablet 0  . lidocaine-prilocaine (EMLA) cream Apply to affected area once 30 g 3  . ondansetron (ZOFRAN ODT) 4 MG disintegrating tablet Take 1 tablet (4 mg total) by mouth every 8 (eight) hours as needed for nausea or vomiting. 30 tablet 0  . ondansetron (ZOFRAN) 8 MG tablet Take 1 tablet (8 mg total) by mouth 2 (two) times daily as needed. Start on day 3 after chemotherapy. 30 tablet 5  . pantoprazole (PROTONIX) 40 MG tablet Take 1 tablet (40 mg total) by mouth 2 (two) times daily. 60 tablet 5  . PARAGARD INTRAUTERINE COPPER IU 1 Device by Intrauterine route once.     . polyethylene glycol (MIRALAX / GLYCOLAX) 17 g packet Take 17 g by mouth 2 (two) times daily. 14 each 0  . potassium chloride SA (KLOR-CON) 20 MEQ tablet Take 1 tablet (20 mEq total) by mouth daily. 30 tablet 1  . prochlorperazine (COMPAZINE) 10 MG tablet Take 1 tablet (10 mg total) by mouth every 6 (six) hours as needed (Nausea or vomiting). 30 tablet 5  . senna-docusate (SENOKOT-S) 8.6-50 MG tablet Take 2 tablets by mouth 2 (two) times daily. 120 tablet 0  . traMADol (ULTRAM) 50 MG tablet Take 1 tablet (50 mg total) by mouth every 12 (twelve) hours as needed for severe pain. 30 tablet 0   No current facility-administered medications for this visit.   Facility-Administered Medications Ordered in Other Visits  Medication Dose Route Frequency  Provider Last Rate Last Admin  . fluorouracil (ADRUCIL) 5,000 mg in sodium chloride 0.9 % 150 mL chemo infusion  5,000 mg Intravenous 1 day or 1 dose [name], MD   5,000 mg at 07/18/20 1500  . heparin lock flush 100 unit/mL  500 Units Intracatheter Once PRN [name], MD      . sodium chloride flush (NS) 0.9 % injection 10 mL  10 mL Intracatheter PRN [name], MD        PHYSICAL EXAMINATION: ECOG PERFORMANCE STATUS: 1 - Symptomatic but completely ambulatory  Vitals:   07/18/20 0906  BP: 108/75  Pulse: 88  Resp: 18  Temp: 97.8 F (36.6 C)  SpO2: 99%   Filed Weights   07/18/20 0906  Weight: 152 lb 4.8 oz (69.1 kg)    GENERAL:alert, no distress and comfortable SKIN: skin color, texture, turgor are normal, no rashes or significant lesions EYES: normal, Conjunctiva are pink and non-injected, sclera clear  NECK: supple, thyroid normal size, non-tender, without nodularity LYMPH:  no palpable lymphadenopathy in the cervical, axillary  LUNGS: clear to auscultation and percussion with normal breathing  effort HEART: regular rate & rhythm and no murmurs and no lower extremity edema ABDOMEN:abdomen soft, non-tender and normal bowel sounds. No hepatomegaly  Musculoskeletal:no cyanosis of digits and no clubbing  NEURO: alert & oriented x 3 with fluent speech, no focal motor/sensory deficits  LABORATORY DATA:  I have reviewed the data as listed CBC Latest Ref Rng & Units 07/18/2020 07/04/2020 06/20/2020  WBC 4.0 - 10.5 K/uL 7.4 6.7 8.3  Hemoglobin 12.0 - 15.0 g/dL 14.4 14.7 13.3  Hematocrit 36 - 46 % 42.9 44.4 39.9  Platelets 150 - 400 K/uL 139(L) 148(L) 188     CMP Latest Ref Rng & Units 07/18/2020 07/04/2020 06/20/2020  Glucose 70 - 99 mg/dL 97 94 103(H)  BUN 6 - 20 mg/dL $Remove'9 9 6  'aZcOvVO$ Creatinine 0.44 - 1.00 mg/dL 0.71 0.75 0.68  Sodium 135 - 145 mmol/L 140 139 139  Potassium 3.5 - 5.1 mmol/L 3.6 3.7 3.6  Chloride 98 - 111 mmol/L 104 106 106  CO2 22 - 32 mmol/L $RemoveB'26 27 28  'uPZeJPwr$ Calcium 8.9 -  10.3 mg/dL 9.5 9.6 8.6(L)  Total Protein 6.5 - 8.1 g/dL 7.1 7.4 6.5  Total Bilirubin 0.3 - 1.2 mg/dL 0.4 0.3 <0.2(L)  Alkaline Phos 38 - 126 U/L 95 93 76  AST 15 - 41 U/L 13(L) 14(L) 12(L)  ALT 0 - 44 U/L $Remo'12 15 12      'JkPla$ RADIOGRAPHIC STUDIES: I have personally reviewed the radiological images as listed and agreed with the findings in the report. No results found.   ASSESSMENT & PLAN:  Stephanie [name] is a 39 y.o. 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 Barry Dienes on 04/04/20. She was found to have perforation and positive radial margin.Liver biopsy on 04/16/2020 confirmed metastatic disease from her colon cancer -Work up isconsistent with stage IV colon cancer s/p surgical resection of the primary tumor with oligo liver metastasis, overall the disease burden appears to be low.  -I started her on first line chemo withFOLFOXIRI q2 weeks for3-6 months beginning 05/23/20.  Bevacizumab-bvzr [name] added with C2.  -If she has dramatic response with limited residual metastatic disease and no other distant metastasis, she may be a candidate for liver resection or targeted liver therapy such as ablation.  -Her FO wasK-ras mutation (+), she is not a candidate for EGFR inhibitor.  -S/p C4 she is tolerating treatment moderately well with fatigue and cold sensitivity. She notes 2 episode of bacteria vaginosis treated with short course cipro since bing on chemo. Will monitor.  -Physical exam unremarkable. Labs reviewed, plt 139K. CEA has been negative since her surgery. Labs overall adequate to proceed with C5 FOLFIRINOX and Zirabev today.  -Plan to scan her after C6. -F/u in 2 weeks.    2. Mild nausea, diarrhea/constipation, Acid Reflux, secondary to chemo  -She can use  Zofran or compazine as needed.  -She can use Miralax for constipation and imodium for diarrhea as needed. She has magnesium citrate which she can use if Miralax is not enough.  -For Acid Reflux, will continue Protonix.  -For cold sensitivity from oxaliplatin, I encouraged her to stay warm.  -If she develops any caner related pain, she still has Tramadol.  -Stable.   3.Anemia, secondary to #1 and iron deficiency from GI Blood loss. Resolved s/p IV Feraheme in 02/2020 and 05/11/20.  4. Financial assistance  -She notes she stopped working from home given her troubles to work with cold sensitivity.  -she has been approved for disability, and applied for SS, medicaid, and [employer] grant   Plan: -Labs reviewed and adequate to proceed with C5 FOLFOXIRI and Zirabev today at same dose,Udenyca on day 3 -Lab, flush, f/u and FOLFIRINOX and Zirabev in 2, 4, 6 weeks -CT AP W contrast in 4 weeks a few days before C7.    No problem-specific Assessment & Plan notes found for this encounter.   Orders Placed This Encounter  Procedures  . CT Abdomen Pelvis W Contrast    Standing Status:   Future    Standing Expiration Date:   07/18/2021    Order Specific Question:   If indicated for the ordered procedure, I authorize the administration of contrast media per Radiology protocol    Answer:   Yes    Order Specific Question:   Is patient pregnant?    Answer:   No    Order Specific Question:   Preferred imaging location?    Answer:   [facility]    Order Specific Question:   Release to patient    Answer:   Immediate    Order Specific Question:   Is Oral Contrast requested for this exam?    Answer:   Yes, Per Radiology protocol   All questions were answered. The patient knows to call the clinic with any problems, questions or concerns. No barriers to learning was detected. The total time spent in the appointment was 30 minutes.     [name], MD 07/18/2020   I, Joslyn Devon, am acting  as scribe for [name], MD.   I have reviewed the above documentation for accuracy and completeness, and I agree with the above.

## 2020-07-18 ENCOUNTER — Inpatient Hospital Stay: Payer: BC Managed Care – PPO

## 2020-07-18 ENCOUNTER — Ambulatory Visit: Payer: BC Managed Care – PPO

## 2020-07-18 ENCOUNTER — Other Ambulatory Visit: Payer: Self-pay

## 2020-07-18 ENCOUNTER — Inpatient Hospital Stay (HOSPITAL_BASED_OUTPATIENT_CLINIC_OR_DEPARTMENT_OTHER): Payer: BC Managed Care – PPO | Admitting: Hematology

## 2020-07-18 ENCOUNTER — Telehealth: Payer: Self-pay | Admitting: Hematology

## 2020-07-18 ENCOUNTER — Encounter: Payer: Self-pay | Admitting: Hematology

## 2020-07-18 VITALS — BP 108/75 | HR 88 | Temp 97.8°F | Resp 18 | Ht 69.0 in | Wt 152.3 lb

## 2020-07-18 DIAGNOSIS — C19 Malignant neoplasm of rectosigmoid junction: Secondary | ICD-10-CM

## 2020-07-18 DIAGNOSIS — R252 Cramp and spasm: Secondary | ICD-10-CM

## 2020-07-18 DIAGNOSIS — Z95828 Presence of other vascular implants and grafts: Secondary | ICD-10-CM

## 2020-07-18 LAB — CBC WITH DIFFERENTIAL (CANCER CENTER ONLY)
Abs Immature Granulocytes: 0.07 10*3/uL (ref 0.00–0.07)
Basophils Absolute: 0.1 10*3/uL (ref 0.0–0.1)
Basophils Relative: 1 %
Eosinophils Absolute: 0.2 10*3/uL (ref 0.0–0.5)
Eosinophils Relative: 2 %
HCT: 42.9 % (ref 36.0–46.0)
Hemoglobin: 14.4 g/dL (ref 12.0–15.0)
Immature Granulocytes: 1 %
Lymphocytes Relative: 19 %
Lymphs Abs: 1.4 10*3/uL (ref 0.7–4.0)
MCH: 28.8 pg (ref 26.0–34.0)
MCHC: 33.6 g/dL (ref 30.0–36.0)
MCV: 85.8 fL (ref 80.0–100.0)
Monocytes Absolute: 0.7 10*3/uL (ref 0.1–1.0)
Monocytes Relative: 9 %
Neutro Abs: 5 10*3/uL (ref 1.7–7.7)
Neutrophils Relative %: 68 %
Platelet Count: 139 10*3/uL — ABNORMAL LOW (ref 150–400)
RBC: 5 MIL/uL (ref 3.87–5.11)
RDW: 15.9 % — ABNORMAL HIGH (ref 11.5–15.5)
WBC Count: 7.4 10*3/uL (ref 4.0–10.5)
nRBC: 0 % (ref 0.0–0.2)

## 2020-07-18 LAB — PREGNANCY, URINE: Preg Test, Ur: NEGATIVE

## 2020-07-18 LAB — CMP (CANCER CENTER ONLY)
ALT: 12 U/L (ref 0–44)
AST: 13 U/L — ABNORMAL LOW (ref 15–41)
Albumin: 3.9 g/dL (ref 3.5–5.0)
Alkaline Phosphatase: 95 U/L (ref 38–126)
Anion gap: 10 (ref 5–15)
BUN: 9 mg/dL (ref 6–20)
CO2: 26 mmol/L (ref 22–32)
Calcium: 9.5 mg/dL (ref 8.9–10.3)
Chloride: 104 mmol/L (ref 98–111)
Creatinine: 0.71 mg/dL (ref 0.44–1.00)
GFR, Estimated: >60 mL/min (ref >60)
Glucose, Bld: 97 mg/dL (ref 70–99)
Potassium: 3.6 mmol/L (ref 3.5–5.1)
Sodium: 140 mmol/L (ref 135–145)
Total Bilirubin: 0.4 mg/dL (ref 0.3–1.2)
Total Protein: 7.1 g/dL (ref 6.5–8.1)

## 2020-07-18 LAB — TOTAL PROTEIN, URINE DIPSTICK: Protein, ur: NEGATIVE mg/dL

## 2020-07-18 LAB — MAGNESIUM: Magnesium: 2.1 mg/dL (ref 1.7–2.4)

## 2020-07-18 MED ORDER — PALONOSETRON HCL INJECTION 0.25 MG/5ML
0.2500 mg | Freq: Once | INTRAVENOUS | Status: AC
  Administered 2020-07-18: 0.25 mg via INTRAVENOUS

## 2020-07-18 MED ORDER — SODIUM CHLORIDE 0.9 % IV SOLN
5000.0000 mg | INTRAVENOUS | Status: DC
  Administered 2020-07-18: 5000 mg via INTRAVENOUS
  Filled 2020-07-18: qty 100

## 2020-07-18 MED ORDER — LEUCOVORIN CALCIUM INJECTION 350 MG
200.0000 mg/m2 | Freq: Once | INTRAVENOUS | Status: AC
  Administered 2020-07-18: 374 mg via INTRAVENOUS
  Filled 2020-07-18: qty 18.7

## 2020-07-18 MED ORDER — DEXTROSE 5 % IV SOLN
Freq: Once | INTRAVENOUS | Status: AC
  Filled 2020-07-18: qty 250

## 2020-07-18 MED ORDER — SODIUM CHLORIDE 0.9 % IV SOLN
Freq: Once | INTRAVENOUS | Status: AC
  Filled 2020-07-18: qty 250

## 2020-07-18 MED ORDER — PALONOSETRON HCL INJECTION 0.25 MG/5ML
INTRAVENOUS | Status: AC
  Filled 2020-07-18: qty 5

## 2020-07-18 MED ORDER — SODIUM CHLORIDE 0.9 % IV SOLN
165.0000 mg/m2 | Freq: Once | INTRAVENOUS | Status: AC
  Administered 2020-07-18: 300 mg via INTRAVENOUS
  Filled 2020-07-18: qty 15

## 2020-07-18 MED ORDER — HEPARIN SOD (PORK) LOCK FLUSH 100 UNIT/ML IV SOLN
500.0000 [IU] | Freq: Once | INTRAVENOUS | Status: DC | PRN
  Filled 2020-07-18: qty 5

## 2020-07-18 MED ORDER — SODIUM CHLORIDE 0.9% FLUSH
10.0000 mL | Freq: Once | INTRAVENOUS | Status: AC
  Administered 2020-07-18: 10 mL
  Filled 2020-07-18: qty 10

## 2020-07-18 MED ORDER — SODIUM CHLORIDE 0.9 % IV SOLN
150.0000 mg | Freq: Once | INTRAVENOUS | Status: AC
  Administered 2020-07-18: 150 mg via INTRAVENOUS
  Filled 2020-07-18: qty 150

## 2020-07-18 MED ORDER — ATROPINE SULFATE 1 MG/ML IJ SOLN
0.5000 mg | Freq: Once | INTRAMUSCULAR | Status: AC | PRN
  Administered 2020-07-18: 0.5 mg via INTRAVENOUS

## 2020-07-18 MED ORDER — SODIUM CHLORIDE 0.9 % IV SOLN
10.0000 mg | Freq: Once | INTRAVENOUS | Status: AC
  Administered 2020-07-18: 10 mg via INTRAVENOUS
  Filled 2020-07-18: qty 10

## 2020-07-18 MED ORDER — ATROPINE SULFATE 1 MG/ML IJ SOLN
INTRAMUSCULAR | Status: AC
  Filled 2020-07-18: qty 1

## 2020-07-18 MED ORDER — SODIUM CHLORIDE 0.9% FLUSH
10.0000 mL | INTRAVENOUS | Status: DC | PRN
  Filled 2020-07-18: qty 10

## 2020-07-18 MED ORDER — SODIUM CHLORIDE 0.9 % IV SOLN
5.0000 mg/kg | Freq: Once | INTRAVENOUS | Status: AC
  Administered 2020-07-18: 350 mg via INTRAVENOUS
  Filled 2020-07-18: qty 14

## 2020-07-18 MED ORDER — OXALIPLATIN CHEMO INJECTION 100 MG/20ML
85.0000 mg/m2 | Freq: Once | INTRAVENOUS | Status: AC
  Administered 2020-07-18: 160 mg via INTRAVENOUS
  Filled 2020-07-18: qty 32

## 2020-07-18 NOTE — Progress Notes (Signed)
Patient had negative UA on 07/09/2020. Dr. Burr Medico said ok to proceed with treatment without repeat UA or pregnancy test today. Patient reports she is not sexually active.

## 2020-07-18 NOTE — Patient Instructions (Signed)
[facility] Discharge Instructions for Patients Receiving Chemotherapy  Today you received the following chemotherapy agents bevacizumab, irinotecan, oxaliplatin, leucovorin, fluorouracil.  To help prevent nausea and vomiting after your treatment, we encourage you to take your nausea medication as directed.    If you develop nausea and vomiting that is not controlled by your nausea medication, call the clinic.   BELOW ARE SYMPTOMS THAT SHOULD BE REPORTED IMMEDIATELY:  *FEVER GREATER THAN 100.5 F  *CHILLS WITH OR WITHOUT FEVER  NAUSEA AND VOMITING THAT IS NOT CONTROLLED WITH YOUR NAUSEA MEDICATION  *UNUSUAL SHORTNESS OF BREATH  *UNUSUAL BRUISING OR BLEEDING  TENDERNESS IN MOUTH AND THROAT WITH OR WITHOUT PRESENCE OF ULCERS  *URINARY PROBLEMS  *BOWEL PROBLEMS  UNUSUAL RASH Items with * indicate a potential emergency and should be followed up as soon as possible.  Feel free to call the clinic should you have any questions or concerns. The clinic phone number is [phone].  Please show the [facility] at check-in to the Emergency Department and triage nurse.

## 2020-07-18 NOTE — Telephone Encounter (Signed)
Scheduled appointments per 10/20 los. Spoke to patient who is aware of appointments dates and times. Gave patient calendar print out.

## 2020-07-20 ENCOUNTER — Inpatient Hospital Stay: Payer: BC Managed Care – PPO

## 2020-07-20 ENCOUNTER — Other Ambulatory Visit: Payer: Self-pay

## 2020-07-20 VITALS — BP 98/69 | HR 73 | Resp 16

## 2020-07-20 DIAGNOSIS — C19 Malignant neoplasm of rectosigmoid junction: Secondary | ICD-10-CM

## 2020-07-20 MED ORDER — SODIUM CHLORIDE 0.9% FLUSH
10.0000 mL | INTRAVENOUS | Status: DC | PRN
  Administered 2020-07-20: 10 mL
  Filled 2020-07-20: qty 10

## 2020-07-20 MED ORDER — PEGFILGRASTIM-CBQV 6 MG/0.6ML ~~LOC~~ SOSY
PREFILLED_SYRINGE | SUBCUTANEOUS | Status: AC
  Filled 2020-07-20: qty 0.6

## 2020-07-20 MED ORDER — PEGFILGRASTIM-CBQV 6 MG/0.6ML ~~LOC~~ SOSY
6.0000 mg | PREFILLED_SYRINGE | Freq: Once | SUBCUTANEOUS | Status: AC
  Administered 2020-07-20: 6 mg via SUBCUTANEOUS

## 2020-07-20 MED ORDER — HEPARIN SOD (PORK) LOCK FLUSH 100 UNIT/ML IV SOLN
500.0000 [IU] | Freq: Once | INTRAVENOUS | Status: AC | PRN
  Administered 2020-07-20: 500 [IU]
  Filled 2020-07-20: qty 5

## 2020-07-20 NOTE — Patient Instructions (Signed)
Implanted Port Home Guide An implanted port is a device that is placed under the skin. It is usually placed in the chest. The device can be used to give IV medicine, to take blood, or for dialysis. You may have an implanted port if:  You need IV medicine that would be irritating to the small veins in your hands or arms.  You need IV medicines, such as antibiotics, for a long period of time.  You need IV nutrition for a long period of time.  You need dialysis. Having a port means that your health care provider will not need to use the veins in your arms for these procedures. You may have fewer limitations when using a port than you would if you used other types of long-term IVs, and you will likely be able to return to normal activities after your incision heals. An implanted port has two main parts:  Reservoir. The reservoir is the part where a needle is inserted to give medicines or draw blood. The reservoir is round. After it is placed, it appears as a small, raised area under your skin.  Catheter. The catheter is a thin, flexible tube that connects the reservoir to a vein. Medicine that is inserted into the reservoir goes into the catheter and then into the vein. How is my port accessed? To access your port:  A numbing cream may be placed on the skin over the port site.  Your health care provider will put on a mask and sterile gloves.  The skin over your port will be cleaned carefully with a germ-killing soap and allowed to dry.  Your health care provider will gently pinch the port and insert a needle into it.  Your health care provider will check for a blood return to make sure the port is in the vein and is not clogged.  If your port needs to remain accessed to get medicine continuously (constant infusion), your health care provider will place a clear bandage (dressing) over the needle site. The dressing and needle will need to be changed every week, or as told by your health care  provider. What is flushing? Flushing helps keep the port from getting clogged. Follow instructions from your health care provider about how and when to flush the port. Ports are usually flushed with saline solution or a medicine called heparin. The need for flushing will depend on how the port is used:  If the port is only used from time to time to give medicines or draw blood, the port may need to be flushed: ? Before and after medicines have been given. ? Before and after blood has been drawn. ? As part of routine maintenance. Flushing may be recommended every 4-6 weeks.  If a constant infusion is running, the port may not need to be flushed.  Throw away any syringes in a disposal container that is meant for sharp items (sharps container). You can buy a sharps container from a pharmacy, or you can make one by using an empty hard plastic bottle with a cover. How long will my port stay implanted? The port can stay in for as long as your health care provider thinks it is needed. When it is time for the port to come out, a surgery will be done to remove it. The surgery will be similar to the procedure that was done to put the port in. Follow these instructions at home:   Flush your port as told by your health care provider.    If you need an infusion over several days, follow instructions from your health care provider about how to take care of your port site. Make sure you: ? Wash your hands with soap and water before you change your dressing. If soap and water are not available, use alcohol-based hand sanitizer. ? Change your dressing as told by your health care provider. ? Place any used dressings or infusion bags into a plastic bag. Throw that bag in the trash. ? Keep the dressing that covers the needle clean and dry. Do not get it wet. ? Do not use scissors or sharp objects near the tube. ? Keep the tube clamped, unless it is being used.  Check your port site every day for signs of  infection. Check for: ? Redness, swelling, or pain. ? Fluid or blood. ? Pus or a bad smell.  Protect the skin around the port site. ? Avoid wearing bra straps that rub or irritate the site. ? Protect the skin around your port from seat belts. Place a soft pad over your chest if needed.  Bathe or shower as told by your health care provider. The site may get wet as long as you are not actively receiving an infusion.  Return to your normal activities as told by your health care provider. Ask your health care provider what activities are safe for you.  Carry a medical alert card or wear a medical alert bracelet at all times. This will let health care providers know that you have an implanted port in case of an emergency. Get help right away if:  You have redness, swelling, or pain at the port site.  You have fluid or blood coming from your port site.  You have pus or a bad smell coming from the port site.  You have a fever. Summary  Implanted ports are usually placed in the chest for long-term IV access.  Follow instructions from your health care provider about flushing the port and changing bandages (dressings).  Take care of the area around your port by avoiding clothing that puts pressure on the area, and by watching for signs of infection.  Protect the skin around your port from seat belts. Place a soft pad over your chest if needed.  Get help right away if you have a fever or you have redness, swelling, pain, drainage, or a bad smell at the port site. This information is not intended to replace advice given to you by your health care provider. Make sure you discuss any questions you have with your health care provider. Document Revised: 01/07/2019 Document Reviewed: 10/18/2016 Elsevier Patient Education  2020 [employer] Pegfilgrastim injection What is this medicine? PEGFILGRASTIM (PEG fil gra stim) is a long-acting granulocyte colony-stimulating factor that stimulates the  growth of neutrophils, a type of white blood cell important in the body's fight against infection. It is used to reduce the incidence of fever and infection in patients with certain types of cancer who are receiving chemotherapy that affects the bone marrow, and to increase survival after being exposed to high doses of radiation. This medicine may be used for other purposes; ask your health care provider or pharmacist if you have questions. COMMON BRAND NAME(S): Fulphila, Neulasta, UDENYCA, Ziextenzo What should I tell my health care provider before I take this medicine? They need to know if you have any of these conditions:  kidney disease  latex allergy  ongoing radiation therapy  sickle cell disease  skin reactions to acrylic adhesives (On-Body   Injector only)  an unusual or allergic reaction to pegfilgrastim, filgrastim, other medicines, foods, dyes, or preservatives  pregnant or trying to get pregnant  breast-feeding How should I use this medicine? This medicine is for injection under the skin. If you get this medicine at home, you will be taught how to prepare and give the pre-filled syringe or how to use the On-body Injector. Refer to the patient Instructions for Use for detailed instructions. Use exactly as directed. Tell your healthcare provider immediately if you suspect that the On-body Injector may not have performed as intended or if you suspect the use of the On-body Injector resulted in a missed or partial dose. It is important that you put your used needles and syringes in a special sharps container. Do not put them in a trash can. If you do not have a sharps container, call your pharmacist or healthcare provider to get one. Talk to your pediatrician regarding the use of this medicine in children. While this drug may be prescribed for selected conditions, precautions do apply. Overdosage: If you think you have taken too much of this medicine contact a poison control center or  emergency room at once. NOTE: This medicine is only for you. Do not share this medicine with others. What if I miss a dose? It is important not to miss your dose. Call your doctor or health care professional if you miss your dose. If you miss a dose due to an On-body Injector failure or leakage, a new dose should be administered as soon as possible using a single prefilled syringe for manual use. What may interact with this medicine? Interactions have not been studied. Give your health care provider a list of all the medicines, herbs, non-prescription drugs, or dietary supplements you use. Also tell them if you smoke, drink alcohol, or use illegal drugs. Some items may interact with your medicine. This list may not describe all possible interactions. Give your health care provider a list of all the medicines, herbs, non-prescription drugs, or dietary supplements you use. Also tell them if you smoke, drink alcohol, or use illegal drugs. Some items may interact with your medicine. What should I watch for while using this medicine? You may need blood work done while you are taking this medicine. If you are going to need a MRI, CT scan, or other procedure, tell your doctor that you are using this medicine (On-Body Injector only). What side effects may I notice from receiving this medicine? Side effects that you should report to your doctor or health care professional as soon as possible:  allergic reactions like skin rash, itching or hives, swelling of the face, lips, or tongue  back pain  dizziness  fever  pain, redness, or irritation at site where injected  pinpoint red spots on the skin  red or dark-brown urine  shortness of breath or breathing problems  stomach or side pain, or pain at the shoulder  swelling  tiredness  trouble passing urine or change in the amount of urine Side effects that usually do not require medical attention (report to your doctor or health care  professional if they continue or are bothersome):  bone pain  muscle pain This list may not describe all possible side effects. Call your doctor for medical advice about side effects. You may report side effects to FDA at 1-800-FDA-1088. Where should I keep my medicine? Keep out of the reach of children. If you are using this medicine at home, you will be instructed   on how to store it. Throw away any unused medicine after the expiration date on the label. NOTE: This sheet is a summary. It may not cover all possible information. If you have questions about this medicine, talk to your doctor, pharmacist, or health care provider.  2020 [employer]vier/[employer] Standard (2017-12-21 16:57:08)  

## 2020-07-30 NOTE — Progress Notes (Signed)
[place]   [phone] [phone]   Clinic Follow up Note   Patient Care Team: Patient, [name] as PCP - General (General Practice) [name], RN as Oncology Nurse Navigator [name], NP as Nurse Practitioner (Nurse Practitioner) [name], MD as Consulting Physician (Oncology)  Date of Service:  08/01/2020  CHIEF COMPLAINT: f/u of metastatic colon cancer  SUMMARY OF ONCOLOGIC HISTORY: Oncology History Overview Note  Cancer Staging Malignant neoplasm of rectosigmoid junction [facility]) Staging form: Colon and Rectum, AJCC 8th Edition - Pathologic stage from 04/04/2020: pT4a, pN1b, cM1 - Signed by [name], NP on 05/07/2020    Malignant neoplasm of rectosigmoid junction ([place])  03/12/2020 Initial Diagnosis   Malignant neoplasm of rectosigmoid junction ([place])   03/12/2020 Imaging   CT AP with contrast IMPRESSION: 1. Overall findings are highly concerning for colorectal carcinoma involving the sigmoid colon with an associated perforation and adjacent abscess and phlegmon formation as detailed above. Currently, no collection is amenable to percutaneous drainage given their small size and location. 2. New 2 cm mass in the right hepatic lobe concerning for metastatic disease to the liver until proven otherwise. 3. Enlarged regional lymph nodes as detailed above is concerning for nodal metastatic disease. 4. Large stool burden. 5. Prominent pelvic veins which can be seen in patients with pelvic congestion syndrome.   03/13/2020 Imaging   ABD US IMPRESSION: Approximately 2.1 x 2.4 x 2.0 cm lobular homogeneously echogenic lesion in the right hepatic dome corresponds with the abnormality seen on the prior CT scan. Sonographically, this appearance is highly suggestive of a benign hemangioma.   Recommend MRI of the abdomen with gadolinium contrast which may provide a noninvasive diagnosis of benign hemangioma.    03/13/2020  Imaging   MR ABD W/WO CONTRAST Hepatobiliary: Diffuse low signal intensity throughout the hepatic parenchyma on T2 weighted images, presumably a consequence of recent Feraheme injection. In segment 7 of the liver (axial image 8 of series 5) there is a 2.5 x 1.9 cm well-defined lesion which is slightly T2 hyperintense. This lesion appears hyperintense on pre gadolinium T1 weighted images (likely a consequence of Feraheme). Interpretation of enhancement within the lesion is compromised by presence of Feraheme. No other hepatic lesions are confidently identified on today's examination. No intra or extrahepatic biliary ductal dilatation. Gallbladder is normal in appearance.   03/31/2020 Imaging   CT AP W contrast IMPRESSION: 1. Previously noted sigmoid colon mass appears increased in size, and again appears to be associated with a focal contained perforation which crosses the midline and has fistulized into the left adnexal region where there is now what appears to be a large left tubo-ovarian abscess, as detailed above. This is also associated with multiple enlarged lymph nodes in the pelvis measuring up to 1.2 cm in short axis and borderline enlarged retroperitoneal lymph nodes, concerning for metastatic disease. In addition, previously suspected metastatic lesion in segment 7 of the liver has enlarged. 2. Small volume of ascites. 3. Additional incidental findings, as above.   04/04/2020 Cancer Staging   Staging form: Colon and Rectum, AJCC 8th Edition - Pathologic stage from 04/04/2020: pT4a, pN1b, cM1 - Signed by [name], NP on 05/07/2020   04/04/2020 Procedure   Paracentesis, path showed no malignant cells (mixed acute and chronic inflammation present)   04/04/2020 Surgery   Open sigmoid colectomy and end colostomy by Dr. [name]   04/04/2020 Pathology Results   FINAL MICROSCOPIC DIAGNOSIS: A. COLON,  RECTOSIGMOID, RESECTION: - Invasive moderately differentiated  adenocarcinoma, 6 cm, involving rectosigmoid junction - Carcinoma invades into serosal surface with perforation and associated serositis - Radial resection margin is positive for carcinoma; proximal and distal margins are not involved - Lymphovascular invasion is present - Metastatic carcinoma to one of fifteen lymph nodes (1/15); one tumor deposit - See oncology table B. LYMPH NODES, MESENTERIC, RESECTION: - Metastatic adenocarcinoma to one of six lymph nodes (1/6) - One tumor deposit  Addendum to note 2 involved lymph nodes (of 21 examined nodes) pT4a,pN1b MMR-normal, preserved expression of MLH1, MSH2, MSH6, PMS2   04/12/2020 Procedure   PAC placement    04/13/2020 Imaging   CT chest without contrast IMPRESSION: Interval development of bilateral pleural effusions, left slightly greater than right, with resultant bibasilar atelectasis including subtotal collapse of the left lower lobe. No evidence of intrathoracic metastatic disease, though evaluation of the collapsed parenchyma is limited. Hepatic metastasis again demonstrated.     04/16/2020 Pathology Results   FINAL MICROSCOPIC DIAGNOSIS:  A. LIVER, RIGHT LOBE, BIOPSY:  - Adenocarcinoma.  COMMENT:  The morphology is compatible with the provided clinical history of colorectal carcinoma.    05/23/2020 -  Chemotherapy   FOLFIRINOX q2weeks for 3-6 months starting 05/23/20. Bevacizumab-bvzr [name] added with C2.       CURRENT THERAPY:  FOLFIRINOX q2weeks for 3-6 months starting 05/23/20. Bevacizumab-bvzr [name] added with C2.  INTERVAL HISTORY:  Stephanie [name] is here for a follow up. She presents to the clinic alone. She notes she is stable. She has fatigue and cold sensitivity from chemo but no other major side effects. She is able to recover. She notes yesterday having an episode of diarrhea but this has improved. She notes she is eating adequately. She notes she is feeling better compared to before her cancer  diagnosis. She feels she has a lot more energy on her week 2 of treatment. She notes she has not been sexually active and knows she is not pregnant.    REVIEW OF SYSTEMS:   Constitutional: Denies fevers, chills or abnormal weight loss Eyes: Denies blurriness of vision Ears, nose, mouth, throat, and face: Denies mucositis or sore throat Respiratory: Denies cough, dyspnea or wheezes Cardiovascular: Denies palpitation, chest discomfort or lower extremity swelling Gastrointestinal:  Denies nausea, heartburn or change in bowel habits Skin: Denies abnormal skin rashes Lymphatics: Denies new lymphadenopathy or easy bruising Neurological:Denies numbness, tingling or new weaknesses Behavioral/Psych: Mood is stable, no new changes  All other systems were reviewed with the patient and are negative.  MEDICAL HISTORY:  Past Medical History:  Diagnosis Date  . BV (bacterial vaginosis)   . Lactose intolerance 03/12/2020  . UTI (lower urinary tract infection)     SURGICAL HISTORY: Past Surgical History:  Procedure Laterality Date  . CESAREAN SECTION    . CYSTOSCOPY WITH STENT PLACEMENT  04/04/2020   Procedure: CYSTOSCOPY WITH STENT PLACEMENT;  Surgeon: [name], MD;  Location: [facility];  Service: General;;  . LAPAROTOMY N/A 04/04/2020   Procedure: EXPLORATORY LAPAROTOMY;  Surgeon: [name], MD;  Location: [facility];  Service: General;  Laterality: N/A;  . PORTACATH PLACEMENT Right 04/12/2020   Procedure: INSERTION PORT-A-CATH WITH ULTRASOUND;  Surgeon: Kieth Brightly Arta Bruce, MD;  Location: [facility];  Service: General;  Laterality: Right;    I have reviewed the social history and family history with the patient and they are unchanged from previous note.  ALLERGIES:  has No Known Allergies.  MEDICATIONS:  Current Outpatient Medications  Medication Sig Dispense Refill  . ciprofloxacin (CIPRO) 500 MG tablet Take 1 tablet (500 mg total) by mouth 2 (two) times daily. 6 tablet 0  . diclofenac  Sodium (VOLTAREN) 1 % GEL Apply 2 g topically 4 (four) times daily. 50 g 0  . dicyclomine (BENTYL) 10 MG capsule Take 1 capsule (10 mg total) by mouth 3 (three) times daily before meals. 60 capsule 1  . famotidine (PEPCID) 20 MG tablet Take 1 tablet (20 mg total) by mouth 2 (two) times daily. 60 tablet 0  . ferrous sulfate 325 (65 FE) MG tablet Take 1 tablet (325 mg total) by mouth 2 (two) times daily with a meal. 60 tablet 0  . fluconazole (DIFLUCAN) 150 MG tablet Take 1 tablet (150 mg total) by mouth daily. For 1 dose. Repeat 3 days later if needed 2 tablet 0  . lidocaine-prilocaine (EMLA) cream Apply to affected area once 30 g 3  . ondansetron (ZOFRAN ODT) 4 MG disintegrating tablet Take 1 tablet (4 mg total) by mouth every 8 (eight) hours as needed for nausea or vomiting. 30 tablet 0  . ondansetron (ZOFRAN) 8 MG tablet Take 1 tablet (8 mg total) by mouth 2 (two) times daily as needed. Start on day 3 after chemotherapy. 30 tablet 5  . pantoprazole (PROTONIX) 40 MG tablet Take 1 tablet (40 mg total) by mouth 2 (two) times daily. 60 tablet 5  . PARAGARD INTRAUTERINE COPPER IU 1 Device by Intrauterine route once.     . polyethylene glycol (MIRALAX / GLYCOLAX) 17 g packet Take 17 g by mouth 2 (two) times daily. 14 each 0  . potassium chloride SA (KLOR-CON) 20 MEQ tablet Take 1 tablet (20 mEq total) by mouth daily. 30 tablet 1  . prochlorperazine (COMPAZINE) 10 MG tablet Take 1 tablet (10 mg total) by mouth every 6 (six) hours as needed (Nausea or vomiting). 30 tablet 5  . senna-docusate (SENOKOT-S) 8.6-50 MG tablet Take 2 tablets by mouth 2 (two) times daily. 120 tablet 0  . traMADol (ULTRAM) 50 MG tablet Take 1 tablet (50 mg total) by mouth every 12 (twelve) hours as needed for severe pain. 30 tablet 0   No current facility-administered medications for this visit.    PHYSICAL EXAMINATION: ECOG PERFORMANCE STATUS: 1 - Symptomatic but completely ambulatory  Vitals:   08/01/20 0907  BP: 112/80    Pulse: 80  Resp: 18  Temp: (!) 97.1 F (36.2 C)  SpO2: 99%   Filed Weights   08/01/20 0907  Weight: 151 lb 1.6 oz (68.5 kg)    Due to COVID19 we will limit examination to appearance. Patient had no complaints.  GENERAL:alert, no distress and comfortable SKIN: skin color normal, no rashes or significant lesions EYES: normal, Conjunctiva are pink and non-injected, sclera clear  NEURO: alert & oriented x 3 with fluent speech   LABORATORY DATA:  I have reviewed the data as listed CBC Latest Ref Rng & Units 08/01/2020 07/18/2020 07/04/2020  WBC 4.0 - 10.5 K/uL 7.8 7.4 6.7  Hemoglobin 12.0 - 15.0 g/dL 14.0 14.4 14.7  Hematocrit 36 - 46 % 43.0 42.9 44.4  Platelets 150 - 400 K/uL 125(L) 139(L) 148(L)     CMP Latest Ref Rng & Units 08/01/2020 07/18/2020 07/04/2020  Glucose 70 - 99 mg/dL 132(H) 97 94  BUN 6 - 20 mg/dL _0 Creatinine 0.44 - 1.00 mg/dL 0.69 0.71 0.75  Sodium 135 - 145 mmol/L 139 140 139  Potassium 3.5 - 5.1  mmol/L 3.4(L) 3.6 3.7  Chloride 98 - 111 mmol/L 106 104 106  CO2 22 - 32 mmol/L _0 Calcium 8.9 - 10.3 mg/dL 9.0 9.5 9.6  Total Protein 6.5 - 8.1 g/dL 7.0 7.1 7.4  Total Bilirubin 0.3 - 1.2 mg/dL 0.3 0.4 0.3  Alkaline Phos 38 - 126 U/L 103 95 93  AST 15 - 41 U/L 13(L) 13(L) 14(L)  ALT 0 - 44 U/L _1 RADIOGRAPHIC STUDIES: I have personally reviewed the radiological images as listed and agreed with the findings in the report. No results found.   ASSESSMENT & PLAN:  Stephanie [name] is a 39 y.o. 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 Barry Dienes on 04/04/20. She was found to have perforation and positive radial margin.Liver biopsy on 04/16/2020 confirmed metastatic disease from her colon cancer -Work up isconsistent with  stage IV colon cancer s/p surgical resection of the primary tumor with oligo liver metastasis, overall the disease burden appears to be low.  -Istarted her onfirst line chemo withFOLFOXIRI q2 weeks for3-6 months beginning 05/23/20. Bevacizumab-bvzr [name] added with C2. -If she has dramatic response with limited residual metastatic disease and no other distant metastasis, she may be a candidate for liver resection or targeted liver therapy such as ablation.  -S/p C5 she continues to tolerate well with fatigue and cold sensitivity. No Neuropathy. Labs reviewed, plt 125K, K 3.4, BG 132. Overall adequate to proceed with C6 FOLFIRINOX and Zirabev today.  -Plan to scan her after C6. I discussed after 6 month treatment if she only had partial response in liver we will consider her options such as surgical resection or ablation.  -F/u in 2 weeks.   2. Mild nausea, diarrhea/constipation, Acid Reflux, secondary to chemo  -She can use Zofran or compazine as needed.  -She can use Miralax for constipation and imodium for diarrhea as needed. She has magnesium citrate which she can use if Miralax is not enough.  -For Acid Reflux, will continue Protonix.  -If she develops any caner related pain, she still has Tramadol.  -Stable.   3.Anemia, secondary to #1 and iron deficiencyfrom GI Blood loss. Resolved s/p IVFeraheme in 6/2021and8/13/21.  4. Financial assistance  -She notes she stopped working from home given her troubles to work with cold sensitivity.  -She has been approved for disability, and applied for SS, medicaid, and cone grant  Plan: -Labs reviewed and adequate to proceed with C6FOLFOXIRI and Zirabevtoday at same dose,Udenyca on day 3 -Lab, flush, f/u and FOLFIRINOXand Zirabevin 2, 4, 6 weeks -CT AP W contrast scheduled for 11/15,  a few days before C7.     No problem-specific Assessment & Plan notes found for this encounter.   No orders of the defined types were  placed in this encounter.  All questions were answered. The patient knows to call the clinic with any problems, questions or concerns. No barriers to learning was detected. The total time spent in the appointment was 30 minutes.     [name], MD 08/01/2020   I, Joslyn Devon, am acting as scribe for [name], MD.   I have reviewed the above documentation for accuracy and completeness, and I agree with the above.

## 2020-08-01 ENCOUNTER — Inpatient Hospital Stay: Payer: BC Managed Care – PPO

## 2020-08-01 ENCOUNTER — Other Ambulatory Visit: Payer: Self-pay

## 2020-08-01 ENCOUNTER — Inpatient Hospital Stay: Payer: BC Managed Care – PPO | Attending: Nurse Practitioner

## 2020-08-01 ENCOUNTER — Inpatient Hospital Stay (HOSPITAL_BASED_OUTPATIENT_CLINIC_OR_DEPARTMENT_OTHER): Payer: BC Managed Care – PPO | Admitting: Hematology

## 2020-08-01 ENCOUNTER — Encounter: Payer: Self-pay | Admitting: Hematology

## 2020-08-01 VITALS — BP 112/80 | HR 80 | Temp 97.1°F | Resp 18 | Ht 69.0 in | Wt 151.1 lb

## 2020-08-01 DIAGNOSIS — Z5189 Encounter for other specified aftercare: Secondary | ICD-10-CM | POA: Diagnosis not present

## 2020-08-01 DIAGNOSIS — K219 Gastro-esophageal reflux disease without esophagitis: Secondary | ICD-10-CM | POA: Insufficient documentation

## 2020-08-01 DIAGNOSIS — C787 Secondary malignant neoplasm of liver and intrahepatic bile duct: Secondary | ICD-10-CM | POA: Diagnosis not present

## 2020-08-01 DIAGNOSIS — C19 Malignant neoplasm of rectosigmoid junction: Secondary | ICD-10-CM

## 2020-08-01 DIAGNOSIS — Z5111 Encounter for antineoplastic chemotherapy: Secondary | ICD-10-CM | POA: Insufficient documentation

## 2020-08-01 DIAGNOSIS — Z95828 Presence of other vascular implants and grafts: Secondary | ICD-10-CM

## 2020-08-01 DIAGNOSIS — K5900 Constipation, unspecified: Secondary | ICD-10-CM | POA: Insufficient documentation

## 2020-08-01 DIAGNOSIS — Z79899 Other long term (current) drug therapy: Secondary | ICD-10-CM | POA: Diagnosis not present

## 2020-08-01 DIAGNOSIS — D500 Iron deficiency anemia secondary to blood loss (chronic): Secondary | ICD-10-CM | POA: Diagnosis not present

## 2020-08-01 DIAGNOSIS — Z5112 Encounter for antineoplastic immunotherapy: Secondary | ICD-10-CM | POA: Diagnosis present

## 2020-08-01 LAB — CBC WITH DIFFERENTIAL (CANCER CENTER ONLY)
Abs Immature Granulocytes: 0.03 10*3/uL (ref 0.00–0.07)
Basophils Absolute: 0.1 10*3/uL (ref 0.0–0.1)
Basophils Relative: 1 %
Eosinophils Absolute: 0.1 10*3/uL (ref 0.0–0.5)
Eosinophils Relative: 2 %
HCT: 43 % (ref 36.0–46.0)
Hemoglobin: 14 g/dL (ref 12.0–15.0)
Immature Granulocytes: 0 %
Lymphocytes Relative: 18 %
Lymphs Abs: 1.4 10*3/uL (ref 0.7–4.0)
MCH: 28.7 pg (ref 26.0–34.0)
MCHC: 32.6 g/dL (ref 30.0–36.0)
MCV: 88.3 fL (ref 80.0–100.0)
Monocytes Absolute: 0.6 10*3/uL (ref 0.1–1.0)
Monocytes Relative: 7 %
Neutro Abs: 5.7 10*3/uL (ref 1.7–7.7)
Neutrophils Relative %: 72 %
Platelet Count: 125 10*3/uL — ABNORMAL LOW (ref 150–400)
RBC: 4.87 MIL/uL (ref 3.87–5.11)
RDW: 16.6 % — ABNORMAL HIGH (ref 11.5–15.5)
WBC Count: 7.8 10*3/uL (ref 4.0–10.5)
nRBC: 0 % (ref 0.0–0.2)

## 2020-08-01 LAB — CMP (CANCER CENTER ONLY)
ALT: 15 U/L (ref 0–44)
AST: 13 U/L — ABNORMAL LOW (ref 15–41)
Albumin: 4 g/dL (ref 3.5–5.0)
Alkaline Phosphatase: 103 U/L (ref 38–126)
Anion gap: 8 (ref 5–15)
BUN: 10 mg/dL (ref 6–20)
CO2: 25 mmol/L (ref 22–32)
Calcium: 9 mg/dL (ref 8.9–10.3)
Chloride: 106 mmol/L (ref 98–111)
Creatinine: 0.69 mg/dL (ref 0.44–1.00)
GFR, Estimated: >60 mL/min (ref >60)
Glucose, Bld: 132 mg/dL — ABNORMAL HIGH (ref 70–99)
Potassium: 3.4 mmol/L — ABNORMAL LOW (ref 3.5–5.1)
Sodium: 139 mmol/L (ref 135–145)
Total Bilirubin: 0.3 mg/dL (ref 0.3–1.2)
Total Protein: 7 g/dL (ref 6.5–8.1)

## 2020-08-01 LAB — IRON AND TIBC
Iron: 68 ug/dL (ref 41–142)
Saturation Ratios: 23 % (ref 21–57)
TIBC: 295 ug/dL (ref 236–444)
UIBC: 227 ug/dL (ref 120–384)

## 2020-08-01 LAB — TOTAL PROTEIN, URINE DIPSTICK: Protein, ur: NEGATIVE mg/dL

## 2020-08-01 LAB — CEA (IN HOUSE-CHCC): CEA (CHCC-In House): 2.1 ng/mL (ref 0.00–5.00)

## 2020-08-01 LAB — FERRITIN: Ferritin: 145 ng/mL (ref 11–307)

## 2020-08-01 MED ORDER — ATROPINE SULFATE 1 MG/ML IJ SOLN
0.5000 mg | Freq: Once | INTRAMUSCULAR | Status: AC | PRN
  Administered 2020-08-01: 0.5 mg via INTRAVENOUS

## 2020-08-01 MED ORDER — SODIUM CHLORIDE 0.9 % IV SOLN
5000.0000 mg | INTRAVENOUS | Status: DC
  Administered 2020-08-01: 5000 mg via INTRAVENOUS
  Filled 2020-08-01: qty 100

## 2020-08-01 MED ORDER — ATROPINE SULFATE 1 MG/ML IJ SOLN
INTRAMUSCULAR | Status: AC
  Filled 2020-08-01: qty 1

## 2020-08-01 MED ORDER — PALONOSETRON HCL INJECTION 0.25 MG/5ML
INTRAVENOUS | Status: AC
  Filled 2020-08-01: qty 5

## 2020-08-01 MED ORDER — SODIUM CHLORIDE 0.9 % IV SOLN
10.0000 mg | Freq: Once | INTRAVENOUS | Status: AC
  Administered 2020-08-01: 10 mg via INTRAVENOUS
  Filled 2020-08-01: qty 10

## 2020-08-01 MED ORDER — SODIUM CHLORIDE 0.9 % IV SOLN
Freq: Once | INTRAVENOUS | Status: AC
  Filled 2020-08-01: qty 250

## 2020-08-01 MED ORDER — LEUCOVORIN CALCIUM INJECTION 350 MG
200.0000 mg/m2 | Freq: Once | INTRAVENOUS | Status: AC
  Administered 2020-08-01: 374 mg via INTRAVENOUS
  Filled 2020-08-01: qty 18.7

## 2020-08-01 MED ORDER — SODIUM CHLORIDE 0.9 % IV SOLN
165.0000 mg/m2 | Freq: Once | INTRAVENOUS | Status: AC
  Administered 2020-08-01: 300 mg via INTRAVENOUS
  Filled 2020-08-01: qty 15

## 2020-08-01 MED ORDER — SODIUM CHLORIDE 0.9 % IV SOLN
150.0000 mg | Freq: Once | INTRAVENOUS | Status: AC
  Administered 2020-08-01: 150 mg via INTRAVENOUS
  Filled 2020-08-01: qty 150

## 2020-08-01 MED ORDER — DEXTROSE 5 % IV SOLN
Freq: Once | INTRAVENOUS | Status: AC
  Filled 2020-08-01: qty 250

## 2020-08-01 MED ORDER — OXALIPLATIN CHEMO INJECTION 100 MG/20ML
85.0000 mg/m2 | Freq: Once | INTRAVENOUS | Status: AC
  Administered 2020-08-01: 160 mg via INTRAVENOUS
  Filled 2020-08-01: qty 32

## 2020-08-01 MED ORDER — SODIUM CHLORIDE 0.9% FLUSH
10.0000 mL | Freq: Once | INTRAVENOUS | Status: AC
  Administered 2020-08-01: 10 mL
  Filled 2020-08-01: qty 10

## 2020-08-01 MED ORDER — PALONOSETRON HCL INJECTION 0.25 MG/5ML
0.2500 mg | Freq: Once | INTRAVENOUS | Status: AC
  Administered 2020-08-01: 0.25 mg via INTRAVENOUS

## 2020-08-01 MED ORDER — SODIUM CHLORIDE 0.9 % IV SOLN
5.0000 mg/kg | Freq: Once | INTRAVENOUS | Status: AC
  Administered 2020-08-01: 350 mg via INTRAVENOUS
  Filled 2020-08-01: qty 14

## 2020-08-01 NOTE — Progress Notes (Signed)
Per [place] in [place] lab, urine protein is negative.

## 2020-08-01 NOTE — Patient Instructions (Signed)
[facility] Discharge Instructions for Patients Receiving Chemotherapy  Today you received the following chemotherapy agents: bevacizumab, irinotecan, oxaliplatin, leucovorin, and fluorouracil.  To help prevent nausea and vomiting after your treatment, we encourage you to take your nausea medication as directed.   If you develop nausea and vomiting that is not controlled by your nausea medication, call the clinic.   BELOW ARE SYMPTOMS THAT SHOULD BE REPORTED IMMEDIATELY:  *FEVER GREATER THAN 100.5 F  *CHILLS WITH OR WITHOUT FEVER  NAUSEA AND VOMITING THAT IS NOT CONTROLLED WITH YOUR NAUSEA MEDICATION  *UNUSUAL SHORTNESS OF BREATH  *UNUSUAL BRUISING OR BLEEDING  TENDERNESS IN MOUTH AND THROAT WITH OR WITHOUT PRESENCE OF ULCERS  *URINARY PROBLEMS  *BOWEL PROBLEMS  UNUSUAL RASH Items with * indicate a potential emergency and should be followed up as soon as possible.  Feel free to call the clinic should you have any questions or concerns. The clinic phone number is [phone].  Please show the [place] at check-in to the Emergency Department and triage nurse.

## 2020-08-01 NOTE — Patient Instructions (Signed)

## 2020-08-03 ENCOUNTER — Inpatient Hospital Stay: Payer: BC Managed Care – PPO

## 2020-08-03 ENCOUNTER — Other Ambulatory Visit: Payer: Self-pay

## 2020-08-03 VITALS — BP 93/69 | HR 73 | Temp 98.3°F | Resp 18

## 2020-08-03 DIAGNOSIS — C19 Malignant neoplasm of rectosigmoid junction: Secondary | ICD-10-CM | POA: Diagnosis not present

## 2020-08-03 MED ORDER — PEGFILGRASTIM-CBQV 6 MG/0.6ML ~~LOC~~ SOSY
PREFILLED_SYRINGE | SUBCUTANEOUS | Status: AC
  Filled 2020-08-03: qty 0.6

## 2020-08-03 MED ORDER — HEPARIN SOD (PORK) LOCK FLUSH 100 UNIT/ML IV SOLN
500.0000 [IU] | Freq: Once | INTRAVENOUS | Status: AC | PRN
  Administered 2020-08-03: 500 [IU]
  Filled 2020-08-03: qty 5

## 2020-08-03 MED ORDER — SODIUM CHLORIDE 0.9% FLUSH
10.0000 mL | INTRAVENOUS | Status: DC | PRN
  Administered 2020-08-03: 10 mL
  Filled 2020-08-03: qty 10

## 2020-08-03 MED ORDER — PEGFILGRASTIM-CBQV 6 MG/0.6ML ~~LOC~~ SOSY
6.0000 mg | PREFILLED_SYRINGE | Freq: Once | SUBCUTANEOUS | Status: AC
  Administered 2020-08-03: 6 mg via SUBCUTANEOUS

## 2020-08-03 NOTE — Patient Instructions (Signed)

## 2020-08-04 ENCOUNTER — Inpatient Hospital Stay: Payer: BC Managed Care – PPO

## 2020-08-04 VITALS — BP 126/85 | HR 76 | Temp 98.2°F | Resp 16

## 2020-08-04 DIAGNOSIS — C19 Malignant neoplasm of rectosigmoid junction: Secondary | ICD-10-CM | POA: Diagnosis not present

## 2020-08-04 MED ORDER — POTASSIUM CHLORIDE 10 MEQ/100ML IV SOLN
INTRAVENOUS | Status: AC
  Filled 2020-08-04: qty 100

## 2020-08-04 MED ORDER — SODIUM CHLORIDE 0.9% FLUSH
10.0000 mL | INTRAVENOUS | Status: AC | PRN
  Administered 2020-08-04: 10 mL
  Filled 2020-08-04: qty 10

## 2020-08-04 MED ORDER — SODIUM CHLORIDE 0.9 % IV SOLN
INTRAVENOUS | Status: DC
  Filled 2020-08-04 (×2): qty 250

## 2020-08-04 MED ORDER — POTASSIUM CHLORIDE 10 MEQ/100ML IV SOLN
10.0000 meq | Freq: Once | INTRAVENOUS | Status: AC
  Administered 2020-08-04: 10 meq via INTRAVENOUS

## 2020-08-04 MED ORDER — HEPARIN SOD (PORK) LOCK FLUSH 100 UNIT/ML IV SOLN
500.0000 [IU] | INTRAVENOUS | Status: AC | PRN
  Administered 2020-08-04: 500 [IU]
  Filled 2020-08-04: qty 5

## 2020-08-04 NOTE — Progress Notes (Signed)
Patient presented to infusion room with no previously scheduled appt c/o diarrhea x 6 yesterday with last episode occurring at 2200 last evening. States she is also having bilateral lower extremity cramping and general feeling of not being well. Dr. Lindi Adie notified via phone. Orders received, repeated, and confirmed. Treated as ordered. After IV hydration, patient verbalized marked improvement in symptoms. Vitals stable. Discharged with no further incident.

## 2020-08-04 NOTE — Patient Instructions (Signed)

## 2020-08-05 ENCOUNTER — Emergency Department (HOSPITAL_COMMUNITY): Payer: BC Managed Care – PPO

## 2020-08-05 ENCOUNTER — Emergency Department (HOSPITAL_COMMUNITY)
Admission: EM | Admit: 2020-08-05 | Discharge: 2020-08-05 | Disposition: A | Discharge status: Home / Self Care | Payer: BC Managed Care – PPO | Attending: Emergency Medicine | Admitting: Emergency Medicine

## 2020-08-05 ENCOUNTER — Encounter (HOSPITAL_COMMUNITY): Payer: Self-pay

## 2020-08-05 DIAGNOSIS — Z20822 Contact with and (suspected) exposure to covid-19: Secondary | ICD-10-CM | POA: Diagnosis not present

## 2020-08-05 DIAGNOSIS — R509 Fever, unspecified: Secondary | ICD-10-CM | POA: Diagnosis present

## 2020-08-05 DIAGNOSIS — J3489 Other specified disorders of nose and nasal sinuses: Secondary | ICD-10-CM | POA: Diagnosis not present

## 2020-08-05 HISTORY — DX: Malignant (primary) neoplasm, unspecified: C80.1

## 2020-08-05 LAB — COMPREHENSIVE METABOLIC PANEL
ALT: 16 U/L (ref 0–44)
AST: 20 U/L (ref 15–41)
Albumin: 4.3 g/dL (ref 3.5–5.0)
Alkaline Phosphatase: 140 U/L — ABNORMAL HIGH (ref 38–126)
Anion gap: 9 (ref 5–15)
BUN: 11 mg/dL (ref 6–20)
CO2: 25 mmol/L (ref 22–32)
Calcium: 9 mg/dL (ref 8.9–10.3)
Chloride: 104 mmol/L (ref 98–111)
Creatinine, Ser: 0.53 mg/dL (ref 0.44–1.00)
GFR, Estimated: >60 mL/min (ref >60)
Glucose, Bld: 88 mg/dL (ref 70–99)
Potassium: 3.6 mmol/L (ref 3.5–5.1)
Sodium: 138 mmol/L (ref 135–145)
Total Bilirubin: 0.4 mg/dL (ref 0.3–1.2)
Total Protein: 7.2 g/dL (ref 6.5–8.1)

## 2020-08-05 LAB — URINALYSIS, ROUTINE W REFLEX MICROSCOPIC
Bilirubin Urine: NEGATIVE
Glucose, UA: NEGATIVE mg/dL
Hgb urine dipstick: NEGATIVE
Ketones, ur: NEGATIVE mg/dL
Nitrite: NEGATIVE
Protein, ur: 30 mg/dL — AB
Specific Gravity, Urine: 1.01 (ref 1.005–1.030)
Squamous Epithelial / HPF: >50 — ABNORMAL HIGH (ref 0–5)
WBC, UA: >50 WBC/hpf — ABNORMAL HIGH (ref 0–5)
pH: 7 (ref 5.0–8.0)

## 2020-08-05 LAB — PREGNANCY, URINE: Preg Test, Ur: NEGATIVE

## 2020-08-05 LAB — CBC WITH DIFFERENTIAL/PLATELET
Abs Immature Granulocytes: 6.26 10*3/uL — ABNORMAL HIGH (ref 0.00–0.07)
Basophils Absolute: 0 10*3/uL (ref 0.0–0.1)
Basophils Relative: 0 %
Eosinophils Absolute: 0.2 10*3/uL (ref 0.0–0.5)
Eosinophils Relative: 0 %
HCT: 41.7 % (ref 36.0–46.0)
Hemoglobin: 14 g/dL (ref 12.0–15.0)
Immature Granulocytes: 16 %
Lymphocytes Relative: 6 %
Lymphs Abs: 2.2 10*3/uL (ref 0.7–4.0)
MCH: 30.2 pg (ref 26.0–34.0)
MCHC: 33.6 g/dL (ref 30.0–36.0)
MCV: 90.1 fL (ref 80.0–100.0)
Monocytes Absolute: 0.7 10*3/uL (ref 0.1–1.0)
Monocytes Relative: 2 %
Neutro Abs: 29.1 10*3/uL — ABNORMAL HIGH (ref 1.7–7.7)
Neutrophils Relative %: 76 %
Platelets: 91 10*3/uL — ABNORMAL LOW (ref 150–400)
RBC: 4.63 MIL/uL (ref 3.87–5.11)
RDW: 16.5 % — ABNORMAL HIGH (ref 11.5–15.5)
WBC: 38.4 10*3/uL — ABNORMAL HIGH (ref 4.0–10.5)
nRBC: 0 % (ref 0.0–0.2)

## 2020-08-05 LAB — I-STAT BETA HCG BLOOD, ED (MC, WL, AP ONLY): I-stat hCG, quantitative: 16 m[IU]/mL — ABNORMAL HIGH (ref <5)

## 2020-08-05 LAB — LACTIC ACID, PLASMA
Lactic Acid, Venous: 0.9 mmol/L (ref 0.5–1.9)
Lactic Acid, Venous: 1.1 mmol/L (ref 0.5–1.9)

## 2020-08-05 IMAGING — CT CT ABD-PELV W/ CM
2 of 5 series · 14 of 46 positions shown, 16 images · IV contrast (omnipaque)
Comparison: CT [DATE], MR [DATE]

CLINICAL DATA: Abdominal abscess, infection, suspected rectosigmoid
cancer with prior perforation now with fever

EXAM:
CT ABDOMEN AND PELVIS WITH CONTRAST
TECHNIQUE: Multidetector CT imaging of the abdomen and pelvis was performed
using the standard protocol following bolus administration of
intravenous contrast.
CONTRAST:  100mL OMNIPAQUE IOHEXOL 300 MG/ML  SOLN

[Series 2: axial st · axial · 0.72mm/px · z∈[-443,-23]mm · 11 of 98 slices shown, 13 images]
[im 7/98  soft-tissue]
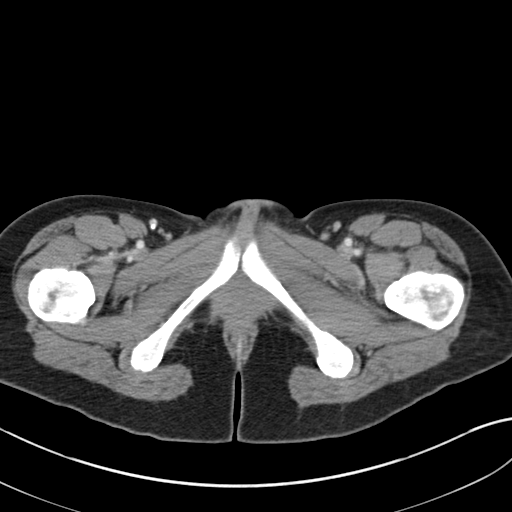
[im 7/98  bone]
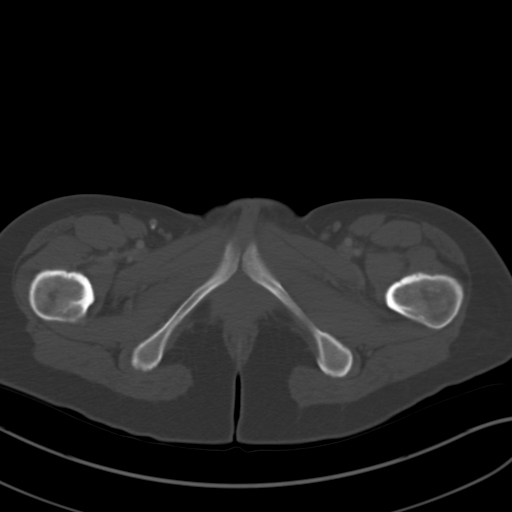
[im 14/98  soft-tissue]
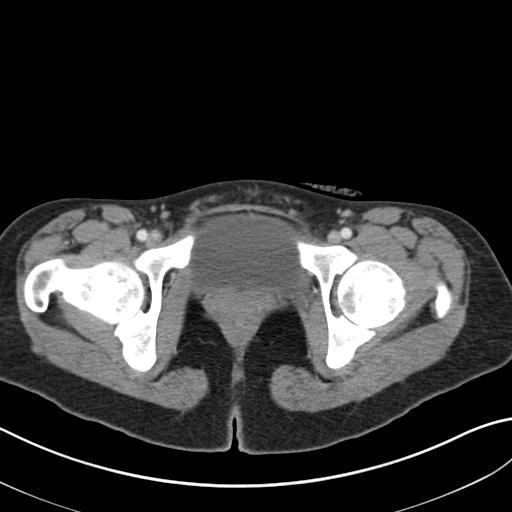
[im 21/98  soft-tissue]
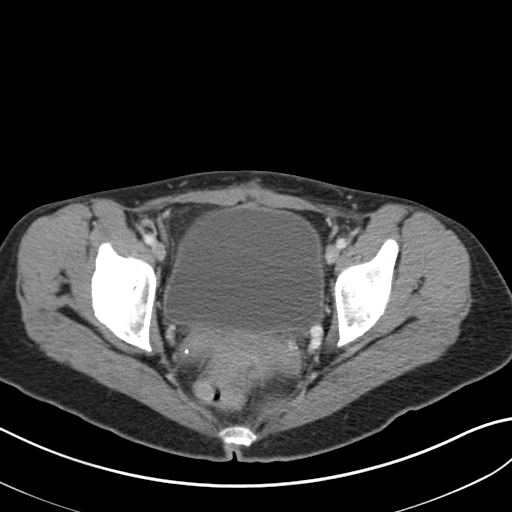
[im 35/98  soft-tissue]
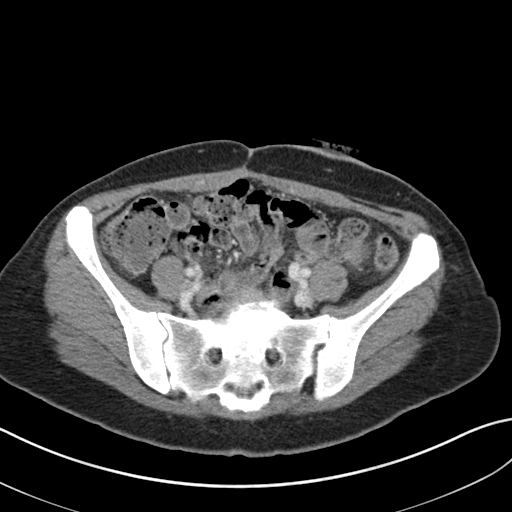
[im 42/98  soft-tissue]
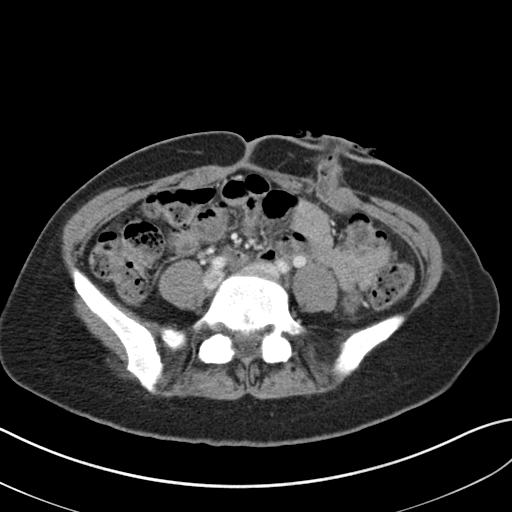
[im 49/98  soft-tissue]
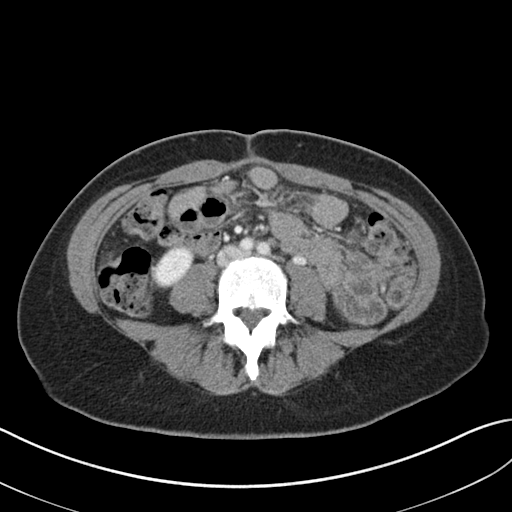
[im 56/98  soft-tissue]
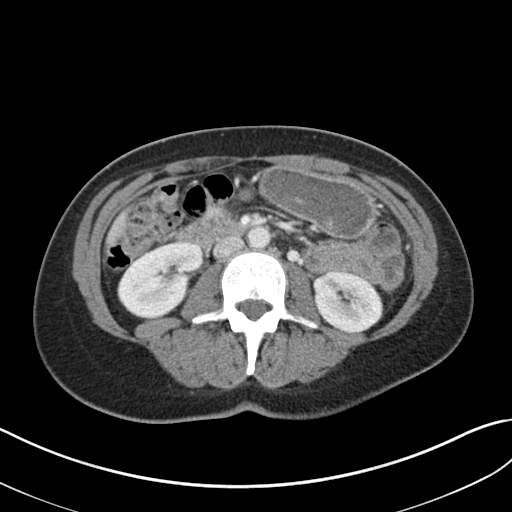
[im 63/98  soft-tissue]
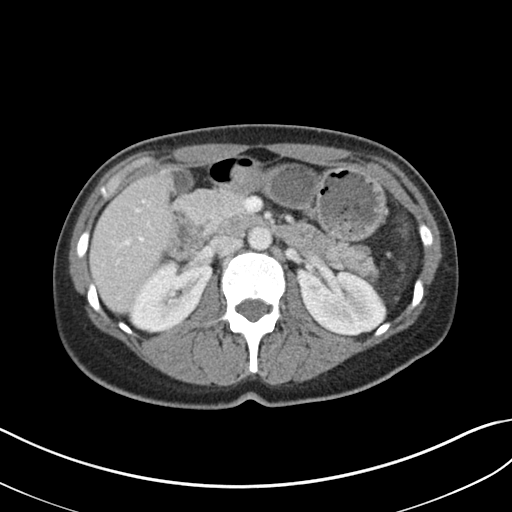
[im 77/98  soft-tissue]
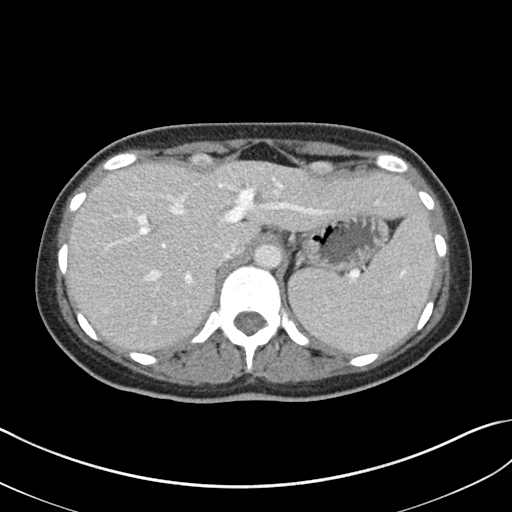
[im 77/98  bone]
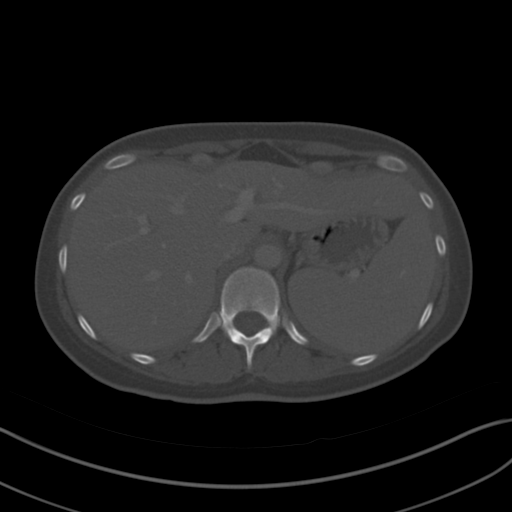
[im 84/98  soft-tissue]
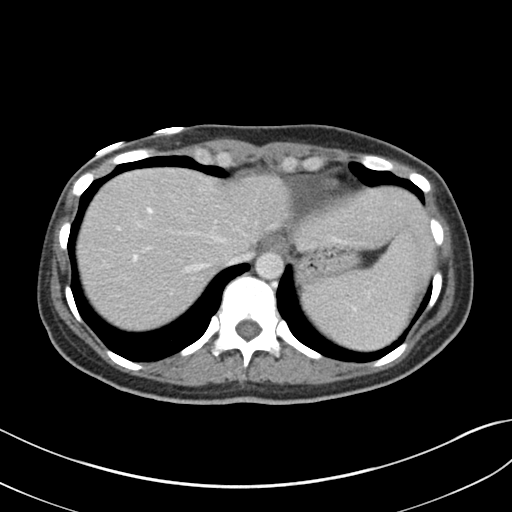
[im 91/98  soft-tissue]
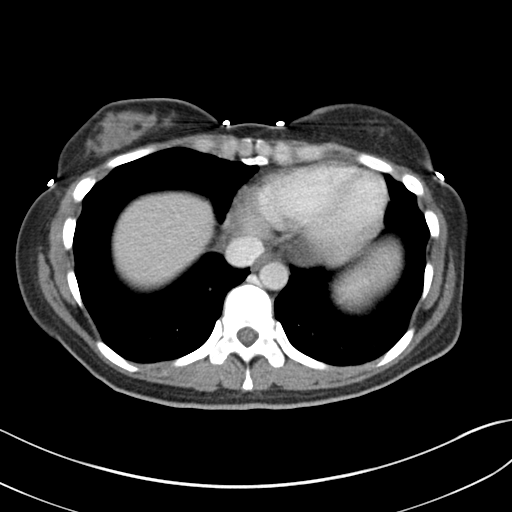

[Series 5: coronal st · coronal · 0.76mm/px · 3 of 133 slices shown]
[im 45/133  soft-tissue]
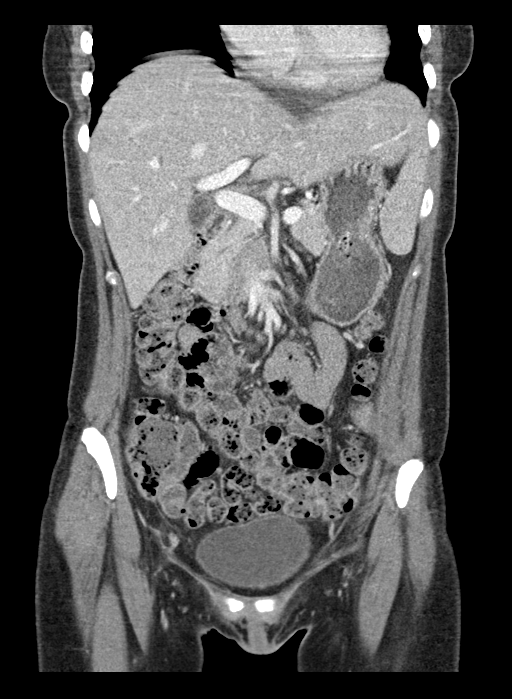
[im 59/133  soft-tissue]
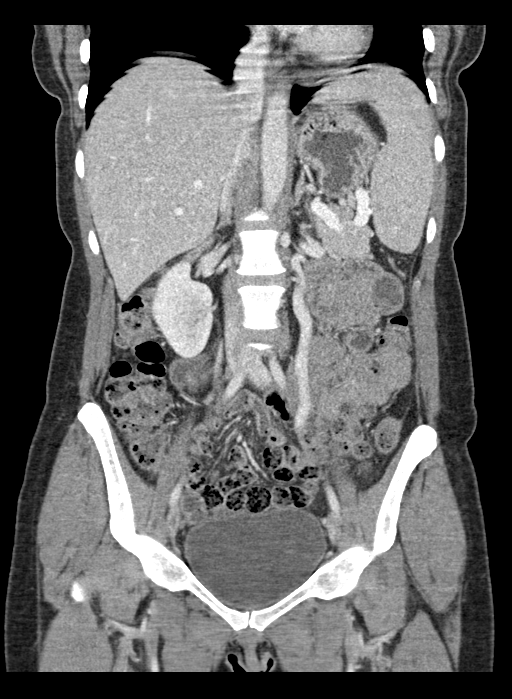
[im 74/133  soft-tissue]
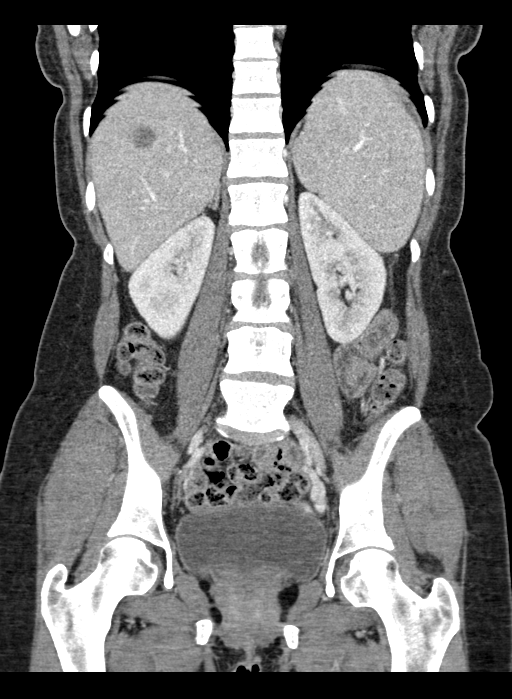

[14 of 46 positions shown; findings below may reference images not displayed]

FINDINGS: Lower chest: Atelectatic changes otherwise clear lung bases. Normal
heart size. No pericardial effusion.

Hepatobiliary: Normal hepatic attenuation with smooth liver surface
contour. Redemonstration of a hypoattenuating lesion posterior right
lobe liver measuring 1.7 x 1.8 x 2 cm. Decreased in size from
comparison with this measured 3.0 x 2.3 x 2.2 cm. This lesion has
previously undergone ultrasound-guided biopsy with pathologic
results demonstrating adenocarcinoma compatible with metastatic
disease from patient's history of colorectal carcinoma. No other new
or concerning liver lesions are seen. Normal gallbladder and biliary
tree.

Pancreas: No pancreatic ductal dilatation or surrounding
inflammatory changes.

Spleen: Splenomegaly, stable from prior.  No focal splenic lesion.

Adrenals/Urinary Tract: Normal adrenal glands. Kidneys are normally
located with symmetric enhancement and excretion. No suspicious
renal lesion, urolithiasis or hydronephrosis. Moderate bladder
distension without gross bladder abnormality.

Stomach/Bowel: Distal esophagus, stomach and duodenal sweep are
unremarkable. No small bowel wall thickening or dilatation.
Postsurgical changes of distal colectomy with a left lower quadrant
end ostomy without evidence of obstruction or acute complication at
this time. Excluded rectal pouch in the deep pelvis without acute
complication or worrisome features. No evidence of obstruction. A
normal appendix is visualized.

Vascular/Lymphatic: Slight prominence of the parametrial vessels
bilaterally, nonspecific though can be seen in the setting of pelvic
congestion syndrome. No other significant vascular findings are
present. No enlarged abdominal or pelvic lymph nodes.

Reproductive: Anteverted uterus with normally positioned IUD. No
concerning adnexal lesions.

Other: Prior vertical midline incision. Mild rectus diastasis with
tiny fat containing umbilical hernia. Left lower quadrant ostomy, as
above. No abdominopelvic free air or fluid. No residual organized
collection or abscess in the pelvis at the sites of previously seen
abscess and perforation.

Musculoskeletal: No acute osseous abnormality or suspicious osseous
lesion. Degenerative changes are present the spine, maximal L5-S1
with near complete disc height loss, vacuum disc phenomenon and a
slight retrolisthesis with associated facet arthropathy but no
discernible pars defect. No suspicious lytic or blastic lesions are
seen.
IMPRESSION: 1. Postsurgical changes of distal colectomy with a left lower
quadrant end ostomy. No evidence of obstruction or acute
complication at this time. Excluded rectal pouch in the deep pelvis
without acute complication or worrisome features.
2. Slight interval decrease in size of a hypoattenuating lesion
posterior right lobe liver measuring 1.7 x 1.8 x 2 cm. This lesion
has previously undergone ultrasound-guided biopsy with pathologic
results demonstrating adenocarcinoma compatible with metastatic
disease from patient's resected colorectal carcinoma.
3. Slight prominence of the parametrial vessels bilaterally,
nonspecific though can be seen in the setting of pelvic congestion
syndrome.
4. Mild splenomegaly.  No focal lesion.

## 2020-08-05 MED ORDER — SULFAMETHOXAZOLE-TRIMETHOPRIM 800-160 MG PO TABS: 1.0000 | ORAL_TABLET | Freq: Two times a day (BID) | ORAL | 0 refills | Status: AC

## 2020-08-05 MED ORDER — IOHEXOL 300 MG/ML  SOLN
100.0000 mL | Freq: Once | INTRAMUSCULAR | Status: AC | PRN
  Administered 2020-08-05: 100 mL via INTRAVENOUS

## 2020-08-05 MED ORDER — SULFAMETHOXAZOLE-TRIMETHOPRIM 800-160 MG PO TABS
1.0000 | ORAL_TABLET | Freq: Once | ORAL | Status: AC
  Administered 2020-08-05: 1 via ORAL
  Filled 2020-08-05: qty 1

## 2020-08-05 NOTE — ED Triage Notes (Signed)
Patient reports to the ER for congestion and fever. Patient reports she called the cancer center and told her to come to the ER due to fever being uncontrolled with tylenol.

## 2020-08-05 NOTE — ED Provider Notes (Signed)
[facility] DEPT Provider Note   CSN: [id number] Arrival date & time: 08/05/20  1647     History Chief Complaint  Patient presents with  . Fever    Stephanie [name] is a 39 y.o. female.  HPI  Patient with a history of rectosigmoid malignancy, now getting chemotherapy presents with concern of fever and rhinorrhea. Last chemotherapy was 4 days ago, this was the sixth of a planned 12 sessions. She notes that she has been doing generally well, but today noticed rhinorrhea. She had one abnormal reading on home thermometer, and after speaking with nursing she presents for evaluation. Subsequent to the last chemotherapy session she had typical weakness, nausea, malaise, had one episode of diarrhea 2 days ago, but states that today she was doing generally better. Additional information obtained on chart review, including documentation from her oncologist Patient is scheduled to have CT scan this coming week prior to the next chemotherapy session. 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   Past Medical History:  Diagnosis Date  . BV (bacterial vaginosis)   . Lactose intolerance 03/12/2020  . UTI (lower urinary tract infection)     Patient Active Problem List   Diagnosis Date Noted  . Port-A-Cath in place 05/03/2020  . Colon cancer ([place])   . Sepsis ([place]) 04/04/2020  . SOB (shortness of breath) 04/04/2020  . Moderate protein-calorie malnutrition ([place]) 04/04/2020  . Nausea & vomiting 04/04/2020  . TOA (tubo-ovarian abscess)   . Intra-abdominal abscess ([place]) 03/31/2020  . Lactose intolerance 03/12/2020  . Microcytic anemia 03/12/2020  . 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 Peritonitis with abscess of intestine ([place]) 03/12/2020  . Recurrent urinary tract infection  06/01/2012  . Contraception, device intrauterine 03/23/2012  . 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 Attention deficit disorder 02/27/2010  . Anxiety state 02/27/2010    Past Surgical History:  Procedure Laterality Date  . CESAREAN SECTION    . CYSTOSCOPY WITH STENT PLACEMENT  04/04/2020   Procedure: CYSTOSCOPY WITH STENT PLACEMENT;  Surgeon: [name], MD;  Location: [facility] ORS;  Service: General;;  . LAPAROTOMY N/A 04/04/2020   Procedure: EXPLORATORY LAPAROTOMY;  Surgeon: [name], MD;  Location: [facility] ORS;  Service: General;  Laterality: N/A;  . PORTACATH PLACEMENT Right 04/12/2020   Procedure: INSERTION PORT-A-CATH WITH ULTRASOUND;  Surgeon: Kieth Brightly Arta Bruce, MD;  Location: [facility];  Service: General;  Laterality: Right;     OB History    Gravida  3   Para  1   Term  1   Preterm      AB  1   Living  1     SAB      TAB  1   Ectopic      Multiple      Live Births              Family History  Problem Relation Age of Onset  . Heart disease Father   . Colon cancer Neg Hx   . Esophageal cancer Neg Hx     Social History   Tobacco Use  . Smoking status: Former Smoker    Packs/day: 0.50    Years: 10.00    Pack years: 5.00    Types: Cigarettes  . Smokeless tobacco: Never Used  Vaping Use  . Vaping Use: Never used  Substance Use Topics  .  Alcohol use: Yes    Comment: seldom  . Drug use: No    Home Medications Prior to Admission medications   Medication Sig Start Date End Date Taking? Authorizing Provider  ciprofloxacin (CIPRO) 500 MG tablet Take 1 tablet (500 mg total) by mouth 2 (two) times daily. 07/09/20   [name], NP  diclofenac Sodium (VOLTAREN) 1 % GEL Apply 2 g topically 4 (four) times daily. 04/16/20   Elodia Florence., MD  dicyclomine (BENTYL) 10 MG capsule Take 1 capsule (10 mg total) by mouth 3 (three) times daily before meals. 07/04/20   [name], NP  famotidine (PEPCID) 20 MG tablet Take 1 tablet (20 mg total) by  mouth 2 (two) times daily. 04/16/20 06/06/20  Elodia Florence., MD  ferrous sulfate 325 (65 FE) MG tablet Take 1 tablet (325 mg total) by mouth 2 (two) times daily with a meal. 03/16/20   Amin, Jeanella Flattery, MD  fluconazole (DIFLUCAN) 150 MG tablet Take 1 tablet (150 mg total) by mouth daily. For 1 dose. Repeat 3 days later if needed 07/09/20   [name], NP  lidocaine-prilocaine (EMLA) cream Apply to affected area once 05/14/20   [name], MD  ondansetron (ZOFRAN ODT) 4 MG disintegrating tablet Take 1 tablet (4 mg total) by mouth every 8 (eight) hours as needed for nausea or vomiting. 05/08/20   [name], MD  ondansetron (ZOFRAN) 8 MG tablet Take 1 tablet (8 mg total) by mouth 2 (two) times daily as needed. Start on day 3 after chemotherapy. 06/19/20   [name], MD  pantoprazole (PROTONIX) 40 MG tablet Take 1 tablet (40 mg total) by mouth 2 (two) times daily. 06/11/20   Tanner, Lyndon Code., PA-C  PARAGARD INTRAUTERINE COPPER IU 1 Device by Intrauterine route once.     [provider]  polyethylene glycol (MIRALAX / GLYCOLAX) 17 g packet Take 17 g by mouth 2 (two) times daily. 04/16/20   Elodia Florence., MD  potassium chloride SA (KLOR-CON) 20 MEQ tablet Take 1 tablet (20 mEq total) by mouth daily. 06/08/20   [name], MD  prochlorperazine (COMPAZINE) 10 MG tablet Take 1 tablet (10 mg total) by mouth every 6 (six) hours as needed (Nausea or vomiting). 06/19/20   [name], MD  senna-docusate (SENOKOT-S) 8.6-50 MG tablet Take 2 tablets by mouth 2 (two) times daily. 03/16/20   Amin, Jeanella Flattery, MD  traMADol (ULTRAM) 50 MG tablet Take 1 tablet (50 mg total) by mouth every 12 (twelve) hours as needed for severe pain. 06/20/20   [name], MD    Allergies    Patient has no known allergies.  Review of Systems   Review of Systems  Constitutional:       Per HPI, otherwise negative  HENT:       Per HPI, otherwise negative  Respiratory:       Per HPI, otherwise negative   Cardiovascular:       Per HPI, otherwise negative  Gastrointestinal: Positive for diarrhea. Negative for vomiting.  Endocrine:       Negative aside from HPI  Genitourinary:       Neg aside from HPI   Musculoskeletal:       Per HPI, otherwise negative  Skin: Negative.   Allergic/Immunologic: Positive for immunocompromised state.  Neurological: Negative for syncope.    Physical Exam Updated Vital Signs BP 100/67 (BP Location: Right Arm)   Pulse 77   Temp 98.8 F (  37.1 C) (Oral)   Resp 16   Ht 5\' 9"  (1.753 m)   Wt 68 kg   SpO2 97%   BMI 22.15 kg/m   Physical Exam Vitals and nursing note reviewed.  Constitutional:      General: She is not in acute distress.    Appearance: She is well-developed.  HENT:     Head: Normocephalic and atraumatic.  Eyes:     Conjunctiva/sclera: Conjunctivae normal.  Cardiovascular:     Rate and Rhythm: Normal rate and regular rhythm.  Pulmonary:     Effort: Pulmonary effort is normal. No respiratory distress.     Breath sounds: Normal breath sounds. No stridor.  Chest:    Abdominal:     General: There is no distension.     Tenderness: There is no abdominal tenderness. There is no guarding.  Skin:    General: Skin is warm and dry.  Neurological:     Mental Status: She is alert and oriented to person, place, and time.     Cranial Nerves: No cranial nerve deficit.      ED Results / Procedures / Treatments   Labs (all labs ordered are listed, but only abnormal results are displayed) Labs Reviewed  URINALYSIS, ROUTINE W REFLEX MICROSCOPIC - Abnormal; Notable for the following components:      Result Value   APPearance TURBID (*)    Protein, ur 30 (*)    Leukocytes,Ua MODERATE (*)    WBC, UA >50 (*)    Bacteria, UA MANY (*)    Squamous Epithelial / LPF >50 (*)    All other components within normal limits  COMPREHENSIVE METABOLIC PANEL - Abnormal; Notable for the following components:   Alkaline Phosphatase 140 (*)    All other  components within normal limits  CBC WITH DIFFERENTIAL/PLATELET - Abnormal; Notable for the following components:   WBC 38.4 (*)    RDW 16.5 (*)    Platelets 91 (*)    Neutro Abs 29.1 (*)    Abs Immature Granulocytes 6.26 (*)    All other components within normal limits  I-STAT BETA HCG BLOOD, ED ([facility], [facility], [facility] ONLY) - Abnormal; Notable for the following components:   I-stat hCG, quantitative 16.0 (*)    All other components within normal limits  RESP PANEL BY RT PCR (RSV, FLU A&B, COVID)  LACTIC ACID, PLASMA  LACTIC ACID, PLASMA  PREGNANCY, URINE     Radiology CT Abdomen Pelvis W Contrast  Result Date: 08/05/2020 CLINICAL DATA:  Abdominal abscess, infection, suspected rectosigmoid cancer with prior perforation now with fever EXAM: CT ABDOMEN AND PELVIS WITH CONTRAST TECHNIQUE: Multidetector CT imaging of the abdomen and pelvis was performed using the standard protocol following bolus administration of intravenous contrast. CONTRAST:  150mL OMNIPAQUE IOHEXOL 300 MG/ML  SOLN COMPARISON:  CT 03/31/2020, MR 03/13/2020 FINDINGS: Lower chest: Atelectatic changes otherwise clear lung bases. Normal heart size. No pericardial effusion. Hepatobiliary: Normal hepatic attenuation with smooth liver surface contour. Redemonstration of a hypoattenuating lesion posterior right lobe liver measuring 1.7 x 1.8 x 2 cm. Decreased in size from comparison with this measured 3.0 x 2.3 x 2.2 cm. This lesion has previously undergone ultrasound-guided biopsy with pathologic results demonstrating adenocarcinoma compatible with metastatic disease from patient's history of colorectal carcinoma. No other new or concerning liver lesions are seen. Normal gallbladder and biliary tree. Pancreas: No pancreatic ductal dilatation or surrounding inflammatory changes. Spleen: Splenomegaly, stable from prior.  No focal splenic lesion. Adrenals/Urinary Tract: Normal adrenal glands.  Kidneys are normally located with symmetric enhancement  and excretion. No suspicious renal lesion, urolithiasis or hydronephrosis. Moderate bladder distension without gross bladder abnormality. Stomach/Bowel: Distal esophagus, stomach and duodenal sweep are unremarkable. No small bowel wall thickening or dilatation. Postsurgical changes of distal colectomy with a left lower quadrant end ostomy without evidence of obstruction or acute complication at this time. Excluded rectal pouch in the deep pelvis without acute complication or worrisome features. No evidence of obstruction. A normal appendix is visualized. Vascular/Lymphatic: Slight prominence of the parametrial vessels bilaterally, nonspecific though can be seen in the setting of pelvic congestion syndrome. No other significant vascular findings are present. No enlarged abdominal or pelvic lymph nodes. Reproductive: Anteverted uterus with normally positioned IUD. No concerning adnexal lesions. Other: Prior vertical midline incision. Mild rectus diastasis with tiny fat containing umbilical hernia. Left lower quadrant ostomy, as above. No abdominopelvic free air or fluid. No residual organized collection or abscess in the pelvis at the sites of previously seen abscess and perforation. Musculoskeletal: No acute osseous abnormality or suspicious osseous lesion. Degenerative changes are present the spine, maximal L5-S1 with near complete disc height loss, vacuum disc phenomenon and a slight retrolisthesis with associated facet arthropathy but no discernible pars defect. No suspicious lytic or blastic lesions are seen. IMPRESSION: 1. Postsurgical changes of distal colectomy with a left lower quadrant end ostomy. No evidence of obstruction or acute complication at this time. Excluded rectal pouch in the deep pelvis without acute complication or worrisome features. 2. Slight interval decrease in size of a hypoattenuating lesion posterior right lobe liver measuring 1.7 x 1.8 x 2 cm. This lesion has previously undergone  ultrasound-guided biopsy with pathologic results demonstrating adenocarcinoma compatible with metastatic disease from patient's resected colorectal carcinoma. 3. Slight prominence of the parametrial vessels bilaterally, nonspecific though can be seen in the setting of pelvic congestion syndrome. 4. Mild splenomegaly.  No focal lesion. Electronically Signed   By: Lovena Le M.D.   On: 08/05/2020 21:32    Procedures Procedures (including critical care time)  Medications Ordered in ED Medications - No data to display  ED Course  I have reviewed the triage vital signs and the nursing notes.  Pertinent labs & imaging results that were available during my care of the patient were reviewed by me and considered in my medical decision making (see chart for details).   Update:, Initial labs reviewed, notable for substantial leukocytosis, with left shift. Given the patient's fever, immunocompromise state, we discussed her recent history again, indications for CT scan, additional urinalysis, labs. Patient amenable to CT scan which again was planned for next week regardless. Patient notes that she may have received medication for immune system supplementation as well.  10:13 PM I have reviewed the patient's CT scan, and discussed the findings with her, and her mother at bedside. CT generally reassuring, no evidence for diverticulitis, abscess, and obvious infection. There is CT evidence for diminished size of her hepatic metastases. Had a lengthy conversation about the rest of today's results, which are generally reassuring in terms of vital signs, with no fever here, though with substantial leukocytosis, and abnormal urinalysis she will receive a short course of antibiotics for possible UTI, she is amenable to this plan. Given the patient's preserved hemodynamic status, close capacity for follow-up, she was discharged in stable condition to speak with her oncologist tomorrow. Covid test sent on discharge  as well. MDM Rules/Calculators/A&P MDM Number of Diagnoses or Management Options Fever in adult: new, needed workup  Amount and/or Complexity of Data Reviewed Clinical lab tests: reviewed Tests in the radiology section of CPT: reviewed Tests in the medicine section of CPT: reviewed Decide to obtain previous medical records or to obtain history from someone other than the patient: yes Obtain history from someone other than the patient: yes Review and summarize past medical records: yes Independent visualization of images, tracings, or specimens: yes  Risk of Complications, Morbidity, and/or Mortality Presenting problems: high Diagnostic procedures: high Management options: high  Critical Care Total time providing critical care: < 30 minutes  Patient Progress Patient progress: stable  Final Clinical Impression(s) / ED Diagnoses Final diagnoses:  Fever in adult    Rx / DC Orders ED Discharge Orders         Ordered    sulfamethoxazole-trimethoprim (BACTRIM DS) 800-160 MG tablet  2 times daily        08/05/20 2213           Carmin Muskrat, MD 08/05/20 2215

## 2020-08-05 NOTE — Discharge Instructions (Signed)
As discussed, your evaluation today has been largely reassuring.  But, it is important that you monitor your condition carefully, and do not hesitate to return to the ED if you develop new, or concerning changes in your condition. ? ?Otherwise, please follow-up with your physician for appropriate ongoing care. ? ?

## 2020-08-06 ENCOUNTER — Telehealth: Payer: Self-pay

## 2020-08-06 LAB — RESP PANEL BY RT PCR (RSV, FLU A&B, COVID)
Influenza A by PCR: NEGATIVE
Influenza B by PCR: NEGATIVE
Respiratory Syncytial Virus by PCR: NEGATIVE
SARS Coronavirus 2 by RT PCR: NEGATIVE

## 2020-08-06 NOTE — Telephone Encounter (Signed)
I called to see how Stephanie [name] was feeling after going to the ED yesterday.  She did not answer.  I was unable to leave a message.

## 2020-08-10 ENCOUNTER — Telehealth: Payer: Self-pay

## 2020-08-10 ENCOUNTER — Other Ambulatory Visit: Payer: Self-pay | Admitting: Hematology

## 2020-08-10 MED ORDER — TRAMADOL HCL 50 MG PO TABS: 50.0000 mg | ORAL_TABLET | Freq: Two times a day (BID) | ORAL | 0 refills | Status: DC | PRN

## 2020-08-10 NOTE — Telephone Encounter (Signed)
Stephanie [name] called requesting refill for her tramadol.

## 2020-08-10 NOTE — Progress Notes (Signed)
Stephanie [name]   [phone] [phone]   Clinic Follow up Note   Patient Care Team: Patient, [name] as PCP - General (General Practice) Stephanie Finner, RN as Oncology Nurse Navigator Stephanie Feeling, NP as Nurse Practitioner (Nurse Practitioner) Stephanie Merle, MD as Consulting Physician (Oncology)  Date of Service:  08/15/2020  CHIEF COMPLAINT: f/u of metastatic colon cancer  SUMMARY OF ONCOLOGIC HISTORY: Oncology History Overview Note  Cancer Staging Malignant neoplasm of rectosigmoid junction Stephanie [name]) Staging form: Colon and Rectum, AJCC 8th Edition - Pathologic stage from 04/04/2020: pT4a, pN1b, cM1 - Signed by Stephanie Feeling, NP on 05/07/2020    Malignant neoplasm of rectosigmoid junction (Stephanie [name])  03/12/2020 Initial Diagnosis   Malignant neoplasm of rectosigmoid junction (Stephanie [name])   03/12/2020 Imaging   CT AP with contrast IMPRESSION: 1. Overall findings are highly concerning for colorectal carcinoma involving the sigmoid colon with an associated perforation and adjacent abscess and phlegmon formation as detailed above. Currently, no collection is amenable to percutaneous drainage given their small size and location. 2. New 2 cm mass in the right hepatic lobe concerning for metastatic disease to the liver until proven otherwise. 3. Enlarged regional lymph nodes as detailed above is concerning for nodal metastatic disease. 4. Large stool burden. 5. Prominent pelvic veins which can be seen in patients with pelvic congestion syndrome.   03/13/2020 Imaging   ABD US IMPRESSION: Approximately 2.1 x 2.4 x 2.0 cm lobular homogeneously echogenic lesion in the right hepatic dome corresponds with the abnormality seen on the prior CT scan. Sonographically, this appearance is highly suggestive of a benign hemangioma.   Recommend MRI of the abdomen with gadolinium contrast which may provide a noninvasive diagnosis of benign hemangioma.    03/13/2020  Imaging   MR ABD W/WO CONTRAST Hepatobiliary: Diffuse low signal intensity throughout the hepatic parenchyma on T2 weighted images, presumably a consequence of recent Feraheme injection. In segment 7 of the liver (axial image 8 of series 5) there is a 2.5 x 1.9 cm well-defined lesion which is slightly T2 hyperintense. This lesion appears hyperintense on pre gadolinium T1 weighted images (likely a consequence of Feraheme). Interpretation of enhancement within the lesion is compromised by presence of Feraheme. No other hepatic lesions are confidently identified on today's examination. No intra or extrahepatic biliary ductal dilatation. Gallbladder is normal in appearance.   03/31/2020 Imaging   CT AP W contrast IMPRESSION: 1. Previously noted sigmoid colon mass appears increased in size, and again appears to be associated with a focal contained perforation which crosses the midline and has fistulized into the left adnexal region where there is now what appears to be a large left tubo-ovarian abscess, as detailed above. This is also associated with multiple enlarged lymph nodes in the pelvis measuring up to 1.2 cm in short axis and borderline enlarged retroperitoneal lymph nodes, concerning for metastatic disease. In addition, previously suspected metastatic lesion in segment 7 of the liver has enlarged. 2. Small volume of ascites. 3. Additional incidental findings, as above.   04/04/2020 Cancer Staging   Staging form: Colon and Rectum, AJCC 8th Edition - Pathologic stage from 04/04/2020: pT4a, pN1b, cM1 - Signed by Stephanie Feeling, NP on 05/07/2020   04/04/2020 Procedure   Paracentesis, path showed no malignant cells (mixed acute and chronic inflammation present)   04/04/2020 Surgery   Open sigmoid colectomy and end colostomy by Dr. Stark [name]   04/04/2020 Pathology Results   FINAL MICROSCOPIC DIAGNOSIS: A. COLON,  RECTOSIGMOID, RESECTION: - Invasive moderately differentiated  adenocarcinoma, 6 cm, involving rectosigmoid junction - Carcinoma invades into serosal surface with perforation and associated serositis - Radial resection margin is positive for carcinoma; proximal and distal margins are not involved - Lymphovascular invasion is present - Metastatic carcinoma to one of fifteen lymph nodes (1/15); one tumor deposit - See oncology table B. LYMPH NODES, MESENTERIC, RESECTION: - Metastatic adenocarcinoma to one of six lymph nodes (1/6) - One tumor deposit  Addendum to note 2 involved lymph nodes (of 21 examined nodes) pT4a,pN1b MMR-normal, preserved expression of MLH1, MSH2, MSH6, PMS2   04/12/2020 Procedure   PAC placement    04/13/2020 Imaging   CT chest without contrast IMPRESSION: Interval development of bilateral pleural effusions, left slightly greater than right, with resultant bibasilar atelectasis including subtotal collapse of the left lower lobe. No evidence of intrathoracic metastatic disease, though evaluation of the collapsed parenchyma is limited. Hepatic metastasis again demonstrated.     04/16/2020 Pathology Results   FINAL MICROSCOPIC DIAGNOSIS:  A. LIVER, RIGHT LOBE, BIOPSY:  - Adenocarcinoma.  COMMENT:  The morphology is compatible with the provided clinical history of colorectal carcinoma.    05/23/2020 -  Chemotherapy   FOLFIRINOX q2weeks for 3-6 months starting 05/23/20. Bevacizumab-bvzr Stephanie [name]) added with C2.    08/05/2020 Imaging   CT AP  IMPRESSION: 1. Postsurgical changes of distal colectomy with a left lower quadrant end ostomy. No evidence of obstruction or acute complication at this time. Excluded rectal pouch in the deep pelvis without acute complication or worrisome features. 2. Slight interval decrease in size of a hypoattenuating lesion posterior right lobe liver measuring 1.7 x 1.8 x 2 cm. This lesion has previously undergone ultrasound-guided biopsy with pathologic results demonstrating adenocarcinoma  compatible with metastatic disease from patient's resected colorectal carcinoma. 3. Slight prominence of the parametrial vessels bilaterally, nonspecific though can be seen in the setting of pelvic congestion syndrome. 4. Mild splenomegaly.  No focal lesion.        CURRENT THERAPY:  FOLFIRINOX q2weeks for 3-6 months starting 05/23/20.Bevacizumab-bvzr (Zirabev)added with C2.  INTERVAL HISTORY:  Stephanie [name] is here for a follow up. She presents to the clinic alone. She notes she is doing well. Her last cycle was fair with 1 day of 5-6 BM. She required IV Fluids last week. She also went to ED on 11/7 for elevated temp. Given her thermometer was broken she did not have true fever. She also had CT AP scan as well.     REVIEW OF SYSTEMS:   Constitutional: Denies fevers, chills or abnormal weight loss Eyes: Denies blurriness of vision Ears, nose, mouth, throat, and face: Denies mucositis or sore throat Respiratory: Denies cough, dyspnea or wheezes Cardiovascular: Denies palpitation, chest discomfort or lower extremity swelling Gastrointestinal:  Denies nausea, heartburn or change in bowel habits Skin: Denies abnormal skin rashes Lymphatics: Denies new lymphadenopathy or easy bruising Neurological:Denies numbness, tingling or new weaknesses Behavioral/Psych: Mood is stable, no new changes  All other systems were reviewed with the patient and are negative.  MEDICAL HISTORY:  Past Medical History:  Diagnosis Date  . BV (bacterial vaginosis)   . Cancer ([place])   . Lactose intolerance 03/12/2020  . UTI (lower urinary tract infection)     SURGICAL HISTORY: Past Surgical History:  Procedure Laterality Date  . CESAREAN SECTION    . CYSTOSCOPY WITH STENT PLACEMENT  04/04/2020   Procedure: CYSTOSCOPY WITH STENT PLACEMENT;  Surgeon: Stephanie Klein, MD;  Location: [facility];  Service:  General;;  . LAPAROTOMY N/A 04/04/2020   Procedure: EXPLORATORY LAPAROTOMY;  Surgeon: Stephanie Klein, MD;   Location: [facility];  Service: General;  Laterality: N/A;  . PORTACATH PLACEMENT Right 04/12/2020   Procedure: INSERTION PORT-A-CATH WITH ULTRASOUND;  Surgeon: Kieth Brightly Arta Bruce, MD;  Location: [facility];  Service: General;  Laterality: Right;    I have reviewed the social history and family history with the patient and they are unchanged from previous note.  ALLERGIES:  has No Known Allergies.  MEDICATIONS:  Current Outpatient Medications  Medication Sig Dispense Refill  . acetaminophen (TYLENOL) 325 MG tablet Take 650 mg by mouth daily as needed for fever or headache (pain).    . busPIRone (BUSPAR) 10 MG tablet Take 10 mg by mouth 2 (two) times daily as needed (anxiety).     [name] diclofenac Sodium (VOLTAREN) 1 % GEL Apply 2 g topically 4 (four) times daily. (Patient not taking: Reported on 08/05/2020) 50 g 0  . dicyclomine (BENTYL) 10 MG capsule Take 1 capsule (10 mg total) by mouth 3 (three) times daily before meals. (Patient taking differently: Take 10 mg by mouth 2 (two) times daily as needed (stomach cramps). ) 60 capsule 1  . famotidine (PEPCID) 20 MG tablet Take 1 tablet (20 mg total) by mouth 2 (two) times daily. (Patient not taking: Reported on 08/05/2020) 60 tablet 0  . ferrous sulfate 325 (65 FE) MG tablet Take 1 tablet (325 mg total) by mouth 2 (two) times daily with a meal. (Patient not taking: Reported on 08/05/2020) 60 tablet 0  . lidocaine-prilocaine (EMLA) cream Apply to affected area once (Patient taking differently: Apply 1 application topically as needed (prior to port access). ) 30 g 3  . ondansetron (ZOFRAN) 8 MG tablet Take 1 tablet (8 mg total) by mouth 2 (two) times daily as needed. Start on day 3 after chemotherapy. (Patient taking differently: Take 8 mg by mouth 2 (two) times daily as needed for nausea or vomiting. Start on day 3 after chemotherapy.) 30 tablet 5  . pantoprazole (PROTONIX) 40 MG tablet Take 1 tablet (40 mg total) by mouth 2 (two) times daily. 60 tablet 5  .  PARAGARD INTRAUTERINE COPPER IU 1 Device by Intrauterine route once.     . polyethylene glycol (MIRALAX / GLYCOLAX) 17 g packet Take 17 g by mouth 2 (two) times daily. (Patient taking differently: Take 17 g by mouth 2 (two) times daily as needed (constipation). ) 14 each 0  . potassium chloride SA (KLOR-CON) 20 MEQ tablet Take 1 tablet (20 mEq total) by mouth daily. (Patient not taking: Reported on 08/05/2020) 30 tablet 1  . prochlorperazine (COMPAZINE) 10 MG tablet Take 1 tablet (10 mg total) by mouth every 6 (six) hours as needed (Nausea or vomiting). 30 tablet 5  . senna-docusate (SENOKOT-S) 8.6-50 MG tablet Take 2 tablets by mouth 2 (two) times daily. (Patient taking differently: Take 2 tablets by mouth 2 (two) times daily as needed (constipation). ) 120 tablet 0  . traMADol (ULTRAM) 50 MG tablet Take 1 tablet (50 mg total) by mouth every 12 (twelve) hours as needed for severe pain. 30 tablet 0   No current facility-administered medications for this visit.    PHYSICAL EXAMINATION: ECOG PERFORMANCE STATUS: 1 - Symptomatic but completely ambulatory  Vitals:   08/15/20 1043  BP: 110/83  Pulse: 70  Resp: 17  Temp: 98.6 F (37 C)  SpO2: 99%   Filed Weights   08/15/20 1043  Weight: 152 lb 11.2  oz (69.3 kg)    Due to COVID19 we will limit examination to appearance. Patient had no complaints.  GENERAL:alert, no distress and comfortable SKIN: skin color normal, no rashes or significant lesions EYES: normal, Conjunctiva are pink and non-injected, sclera clear  NEURO: alert & oriented x 3 with fluent speech   LABORATORY DATA:  I have reviewed the data as listed CBC Latest Ref Rng & Units 08/15/2020 08/05/2020 08/01/2020  WBC 4.0 - 10.5 K/uL 10.6(H) 38.4(H) 7.8  Hemoglobin 12.0 - 15.0 g/dL 13.5 14.0 14.0  Hematocrit 36 - 46 % 40.2 41.7 43.0  Platelets 150 - 400 K/uL 127(L) 91(L) 125(L)     CMP Latest Ref Rng & Units 08/15/2020 08/05/2020 08/01/2020  Glucose 70 - 99 mg/dL 100(H) 88  132(H)  BUN 6 - 20 mg/dL _0 Creatinine 0.44 - 1.00 mg/dL 0.64 0.53 0.69  Sodium 135 - 145 mmol/L 141 138 139  Potassium 3.5 - 5.1 mmol/L 4.0 3.6 3.4(L)  Chloride 98 - 111 mmol/L 105 104 106  CO2 22 - 32 mmol/L _1 Calcium 8.9 - 10.3 mg/dL 9.0 9.0 9.0  Total Protein 6.5 - 8.1 g/dL 6.8 7.2 7.0  Total Bilirubin 0.3 - 1.2 mg/dL 0.2(L) 0.4 0.3  Alkaline Phos 38 - 126 U/L 108 140(H) 103  AST 15 - 41 U/L 16 20 13(L)  ALT 0 - 44 U/L _2 RADIOGRAPHIC STUDIES: I have personally reviewed the radiological images as listed and agreed with the findings in the report. No results found.   ASSESSMENT & PLAN:  Stephanie [name] is a 39 y.o. 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 Barry Dienes on 04/04/20. She was found to have perforation and positive radial margin.Liver biopsy on 04/16/2020 confirmed metastatic disease from her colon cancer -Work up isconsistent with stage IV colon cancer s/p surgical resection of the primary tumor with oligo liver metastasis, overall the disease burden appears to be low.  -Istarted her onfirst line chemo withFOLFOXIRI q2 weeks for3-6 months beginning 05/23/20. Bevacizumab-bvzr (Zirabev)added with C2. -If she has dramatic response with limited residual metastatic disease and no other distant metastasis, she may be a candidate for liver resection or targeted liver therapy such as ablation.  -S/p C6 she went to ED for suspected fever. She had CT AP same day which showed good partial response to chemo so far, her liver lesion has decreased and no new lesions. I personally reviewed and discussed with patient today.  -She continues to tolerate treatment moderately well with occasional diarrhea. Labs reviewed, WBC 10.6, plt 127K, ANC  8.1, BG 100. Urine protein negative. Overall adequate to proceed with C7 FOLFIRINOX and Zirabev today at same dose.  -Plan to continue treatment for 6 more cycles (3 months) before another CT scan/MRI and possible liver surgery. May reduce or strop oxaliplatin earlier if she develops neuropathy.  -I briefly discussed cancer surveillance and monitoring her risk of cancer recurrence with GuardantReveal Testing after chemo and surgery. She may proceed with colostomy reversal at that time.  -f/u in 2 weeks.    2. Mild nausea, diarrhea/constipation, Acid Reflux, secondary to chemo  -She can use Zofran or compazine as needed.  -She can use Miralax for constipation and imodium for diarrhea as needed.She has magnesium citrate which she  can use if Miralax is not enough. -For Acid Reflux, will continue Protonix.  -If she develops any caner related pain, she still has Tramadol.  -Stable with medication management.   3.Anemia, secondary to #1 and iron deficiencyfrom GI Blood loss. Resolved s/p IVFeraheme in 6/2021and8/13/21.  4. Financial assistance  -She notes she stopped working from home given her troubles to work with cold sensitivity. -She has been approved for disability, and applied for SS, medicaid, and cone grant  Plan: -Labs reviewed and adequate to proceed with C7FOLFOXIRI andZirabevtoday at same dose,Udenyca on day 3 -Lab, flush, f/u and FOLFIRINOXandZirabevin 2, 4, 6 weeks   No problem-specific Assessment & Plan notes found for this encounter.   No orders of the defined types were placed in this encounter.  All questions were answered. The patient knows to call the clinic with any problems, questions or concerns. No barriers to learning was detected. The total time spent in the appointment was 30 minutes.     Stephanie Merle, MD 08/15/2020   I, Joslyn Devon, am acting as scribe for Stephanie Merle, MD.   I have reviewed the above documentation for accuracy and  completeness, and I agree with the above.

## 2020-08-10 NOTE — Telephone Encounter (Signed)
I refilled for her.   Nelissa Bolduc MD  

## 2020-08-13 ENCOUNTER — Ambulatory Visit (HOSPITAL_COMMUNITY): Payer: BC Managed Care – PPO

## 2020-08-15 ENCOUNTER — Inpatient Hospital Stay: Payer: BC Managed Care – PPO

## 2020-08-15 ENCOUNTER — Other Ambulatory Visit: Payer: Self-pay

## 2020-08-15 ENCOUNTER — Inpatient Hospital Stay (HOSPITAL_BASED_OUTPATIENT_CLINIC_OR_DEPARTMENT_OTHER): Payer: BC Managed Care – PPO | Admitting: Hematology

## 2020-08-15 VITALS — BP 110/83 | HR 70 | Temp 98.6°F | Resp 17 | Ht 69.0 in | Wt 152.7 lb

## 2020-08-15 DIAGNOSIS — C19 Malignant neoplasm of rectosigmoid junction: Secondary | ICD-10-CM | POA: Diagnosis not present

## 2020-08-15 LAB — CBC WITH DIFFERENTIAL (CANCER CENTER ONLY)
Abs Immature Granulocytes: 0.07 10*3/uL (ref 0.00–0.07)
Basophils Absolute: 0.1 10*3/uL (ref 0.0–0.1)
Basophils Relative: 1 %
Eosinophils Absolute: 0.1 10*3/uL (ref 0.0–0.5)
Eosinophils Relative: 1 %
HCT: 40.2 % (ref 36.0–46.0)
Hemoglobin: 13.5 g/dL (ref 12.0–15.0)
Immature Granulocytes: 1 %
Lymphocytes Relative: 14 %
Lymphs Abs: 1.5 10*3/uL (ref 0.7–4.0)
MCH: 30.2 pg (ref 26.0–34.0)
MCHC: 33.6 g/dL (ref 30.0–36.0)
MCV: 89.9 fL (ref 80.0–100.0)
Monocytes Absolute: 0.8 10*3/uL (ref 0.1–1.0)
Monocytes Relative: 8 %
Neutro Abs: 8.1 10*3/uL — ABNORMAL HIGH (ref 1.7–7.7)
Neutrophils Relative %: 75 %
Platelet Count: 127 10*3/uL — ABNORMAL LOW (ref 150–400)
RBC: 4.47 MIL/uL (ref 3.87–5.11)
RDW: 16.8 % — ABNORMAL HIGH (ref 11.5–15.5)
WBC Count: 10.6 10*3/uL — ABNORMAL HIGH (ref 4.0–10.5)
nRBC: 0 % (ref 0.0–0.2)

## 2020-08-15 LAB — CMP (CANCER CENTER ONLY)
ALT: 18 U/L (ref 0–44)
AST: 16 U/L (ref 15–41)
Albumin: 3.8 g/dL (ref 3.5–5.0)
Alkaline Phosphatase: 108 U/L (ref 38–126)
Anion gap: 9 (ref 5–15)
BUN: 10 mg/dL (ref 6–20)
CO2: 27 mmol/L (ref 22–32)
Calcium: 9 mg/dL (ref 8.9–10.3)
Chloride: 105 mmol/L (ref 98–111)
Creatinine: 0.64 mg/dL (ref 0.44–1.00)
GFR, Estimated: >60 mL/min (ref >60)
Glucose, Bld: 100 mg/dL — ABNORMAL HIGH (ref 70–99)
Potassium: 4 mmol/L (ref 3.5–5.1)
Sodium: 141 mmol/L (ref 135–145)
Total Bilirubin: 0.2 mg/dL — ABNORMAL LOW (ref 0.3–1.2)
Total Protein: 6.8 g/dL (ref 6.5–8.1)

## 2020-08-15 LAB — TOTAL PROTEIN, URINE DIPSTICK: Protein, ur: NEGATIVE mg/dL

## 2020-08-15 MED ORDER — SODIUM CHLORIDE 0.9 % IV SOLN
165.0000 mg/m2 | Freq: Once | INTRAVENOUS | Status: AC
  Administered 2020-08-15: 300 mg via INTRAVENOUS
  Filled 2020-08-15: qty 15

## 2020-08-15 MED ORDER — SODIUM CHLORIDE 0.9 % IV SOLN
150.0000 mg | Freq: Once | INTRAVENOUS | Status: AC
  Administered 2020-08-15: 150 mg via INTRAVENOUS
  Filled 2020-08-15: qty 150

## 2020-08-15 MED ORDER — SODIUM CHLORIDE 0.9 % IV SOLN
Freq: Once | INTRAVENOUS | Status: AC
  Filled 2020-08-15: qty 250

## 2020-08-15 MED ORDER — PALONOSETRON HCL INJECTION 0.25 MG/5ML
INTRAVENOUS | Status: AC
  Filled 2020-08-15: qty 5

## 2020-08-15 MED ORDER — DEXTROSE 5 % IV SOLN
Freq: Once | INTRAVENOUS | Status: AC
  Filled 2020-08-15: qty 250

## 2020-08-15 MED ORDER — LEUCOVORIN CALCIUM INJECTION 350 MG
200.0000 mg/m2 | Freq: Once | INTRAVENOUS | Status: AC
  Administered 2020-08-15: 374 mg via INTRAVENOUS
  Filled 2020-08-15: qty 18.7

## 2020-08-15 MED ORDER — OXALIPLATIN CHEMO INJECTION 100 MG/20ML
85.0000 mg/m2 | Freq: Once | INTRAVENOUS | Status: AC
  Administered 2020-08-15: 160 mg via INTRAVENOUS
  Filled 2020-08-15: qty 32

## 2020-08-15 MED ORDER — SODIUM CHLORIDE 0.9 % IV SOLN
5.0000 mg/kg | Freq: Once | INTRAVENOUS | Status: AC
  Administered 2020-08-15: 350 mg via INTRAVENOUS
  Filled 2020-08-15: qty 14

## 2020-08-15 MED ORDER — SODIUM CHLORIDE 0.9 % IV SOLN
5000.0000 mg | INTRAVENOUS | Status: DC
  Administered 2020-08-15: 5000 mg via INTRAVENOUS
  Filled 2020-08-15: qty 100

## 2020-08-15 MED ORDER — ATROPINE SULFATE 1 MG/ML IJ SOLN
0.5000 mg | Freq: Once | INTRAMUSCULAR | Status: AC | PRN
  Administered 2020-08-15: 0.5 mg via INTRAVENOUS

## 2020-08-15 MED ORDER — PALONOSETRON HCL INJECTION 0.25 MG/5ML
0.2500 mg | Freq: Once | INTRAVENOUS | Status: AC
  Administered 2020-08-15: 0.25 mg via INTRAVENOUS

## 2020-08-15 MED ORDER — SODIUM CHLORIDE 0.9 % IV SOLN
10.0000 mg | Freq: Once | INTRAVENOUS | Status: AC
  Administered 2020-08-15: 10 mg via INTRAVENOUS
  Filled 2020-08-15: qty 10

## 2020-08-15 NOTE — Patient Instructions (Signed)
Implanted Port Home Guide An implanted port is a device that is placed under the skin. It is usually placed in the chest. The device can be used to give IV medicine, to take blood, or for dialysis. You may have an implanted port if:  You need IV medicine that would be irritating to the small veins in your hands or arms.  You need IV medicines, such as antibiotics, for a long period of time.  You need IV nutrition for a long period of time.  You need dialysis. Having a port means that your health care provider will not need to use the veins in your arms for these procedures. You may have fewer limitations when using a port than you would if you used other types of long-term IVs, and you will likely be able to return to normal activities after your incision heals. An implanted port has two main parts:  Reservoir. The reservoir is the part where a needle is inserted to give medicines or draw blood. The reservoir is round. After it is placed, it appears as a small, raised area under your skin.  Catheter. The catheter is a thin, flexible tube that connects the reservoir to a vein. Medicine that is inserted into the reservoir goes into the catheter and then into the vein. How is my port accessed? To access your port:  A numbing cream may be placed on the skin over the port site.  Your health care provider will put on a mask and sterile gloves.  The skin over your port will be cleaned carefully with a germ-killing soap and allowed to dry.  Your health care provider will gently pinch the port and insert a needle into it.  Your health care provider will check for a blood return to make sure the port is in the vein and is not clogged.  If your port needs to remain accessed to get medicine continuously (constant infusion), your health care provider will place a clear bandage (dressing) over the needle site. The dressing and needle will need to be changed every week, or as told by your health care  provider. What is flushing? Flushing helps keep the port from getting clogged. Follow instructions from your health care provider about how and when to flush the port. Ports are usually flushed with saline solution or a medicine called heparin. The need for flushing will depend on how the port is used:  If the port is only used from time to time to give medicines or draw blood, the port may need to be flushed: ? Before and after medicines have been given. ? Before and after blood has been drawn. ? As part of routine maintenance. Flushing may be recommended every 4-6 weeks.  If a constant infusion is running, the port may not need to be flushed.  Throw away any syringes in a disposal container that is meant for sharp items (sharps container). You can buy a sharps container from a pharmacy, or you can make one by using an empty hard plastic bottle with a cover. How long will my port stay implanted? The port can stay in for as long as your health care provider thinks it is needed. When it is time for the port to come out, a surgery will be done to remove it. The surgery will be similar to the procedure that was done to put the port in. Follow these instructions at home:   Flush your port as told by your health care provider.    If you need an infusion over several days, follow instructions from your health care provider about how to take care of your port site. Make sure you: ? Wash your hands with soap and water before you change your dressing. If soap and water are not available, use alcohol-based hand sanitizer. ? Change your dressing as told by your health care provider. ? Place any used dressings or infusion bags into a plastic bag. Throw that bag in the trash. ? Keep the dressing that covers the needle clean and dry. Do not get it wet. ? Do not use scissors or sharp objects near the tube. ? Keep the tube clamped, unless it is being used.  Check your port site every day for signs of  infection. Check for: ? Redness, swelling, or pain. ? Fluid or blood. ? Pus or a bad smell.  Protect the skin around the port site. ? Avoid wearing bra straps that rub or irritate the site. ? Protect the skin around your port from seat belts. Place a soft pad over your chest if needed.  Bathe or shower as told by your health care provider. The site may get wet as long as you are not actively receiving an infusion.  Return to your normal activities as told by your health care provider. Ask your health care provider what activities are safe for you.  Carry a medical alert card or wear a medical alert bracelet at all times. This will let health care providers know that you have an implanted port in case of an emergency. Get help right away if:  You have redness, swelling, or pain at the port site.  You have fluid or blood coming from your port site.  You have pus or a bad smell coming from the port site.  You have a fever. Summary  Implanted ports are usually placed in the chest for long-term IV access.  Follow instructions from your health care provider about flushing the port and changing bandages (dressings).  Take care of the area around your port by avoiding clothing that puts pressure on the area, and by watching for signs of infection.  Protect the skin around your port from seat belts. Place a soft pad over your chest if needed.  Get help right away if you have a fever or you have redness, swelling, pain, drainage, or a bad smell at the port site. This information is not intended to replace advice given to you by your health care provider. Make sure you discuss any questions you have with your health care provider. Document Revised: 01/07/2019 Document Reviewed: 10/18/2016 Elsevier Patient Education  2020 [employer] Pegfilgrastim injection What is this medicine? PEGFILGRASTIM (PEG fil gra stim) is a long-acting granulocyte colony-stimulating factor that stimulates the  growth of neutrophils, a type of white blood cell important in the body's fight against infection. It is used to reduce the incidence of fever and infection in patients with certain types of cancer who are receiving chemotherapy that affects the bone marrow, and to increase survival after being exposed to high doses of radiation. This medicine may be used for other purposes; ask your health care provider or pharmacist if you have questions. COMMON BRAND NAME(S): Fulphila, Neulasta, UDENYCA, Ziextenzo What should I tell my health care provider before I take this medicine? They need to know if you have any of these conditions:  kidney disease  latex allergy  ongoing radiation therapy  sickle cell disease  skin reactions to acrylic adhesives (On-Body   Injector only)  an unusual or allergic reaction to pegfilgrastim, filgrastim, other medicines, foods, dyes, or preservatives  pregnant or trying to get pregnant  breast-feeding How should I use this medicine? This medicine is for injection under the skin. If you get this medicine at home, you will be taught how to prepare and give the pre-filled syringe or how to use the On-body Injector. Refer to the patient Instructions for Use for detailed instructions. Use exactly as directed. Tell your healthcare provider immediately if you suspect that the On-body Injector may not have performed as intended or if you suspect the use of the On-body Injector resulted in a missed or partial dose. It is important that you put your used needles and syringes in a special sharps container. Do not put them in a trash can. If you do not have a sharps container, call your pharmacist or healthcare provider to get one. Talk to your pediatrician regarding the use of this medicine in children. While this drug may be prescribed for selected conditions, precautions do apply. Overdosage: If you think you have taken too much of this medicine contact a poison control center or  emergency room at once. NOTE: This medicine is only for you. Do not share this medicine with others. What if I miss a dose? It is important not to miss your dose. Call your doctor or health care professional if you miss your dose. If you miss a dose due to an On-body Injector failure or leakage, a new dose should be administered as soon as possible using a single prefilled syringe for manual use. What may interact with this medicine? Interactions have not been studied. Give your health care provider a list of all the medicines, herbs, non-prescription drugs, or dietary supplements you use. Also tell them if you smoke, drink alcohol, or use illegal drugs. Some items may interact with your medicine. This list may not describe all possible interactions. Give your health care provider a list of all the medicines, herbs, non-prescription drugs, or dietary supplements you use. Also tell them if you smoke, drink alcohol, or use illegal drugs. Some items may interact with your medicine. What should I watch for while using this medicine? You may need blood work done while you are taking this medicine. If you are going to need a MRI, CT scan, or other procedure, tell your doctor that you are using this medicine (On-Body Injector only). What side effects may I notice from receiving this medicine? Side effects that you should report to your doctor or health care professional as soon as possible:  allergic reactions like skin rash, itching or hives, swelling of the face, lips, or tongue  back pain  dizziness  fever  pain, redness, or irritation at site where injected  pinpoint red spots on the skin  red or dark-brown urine  shortness of breath or breathing problems  stomach or side pain, or pain at the shoulder  swelling  tiredness  trouble passing urine or change in the amount of urine Side effects that usually do not require medical attention (report to your doctor or health care  professional if they continue or are bothersome):  bone pain  muscle pain This list may not describe all possible side effects. Call your doctor for medical advice about side effects. You may report side effects to FDA at 1-800-FDA-1088. Where should I keep my medicine? Keep out of the reach of children. If you are using this medicine at home, you will be instructed   on how to store it. Throw away any unused medicine after the expiration date on the label. NOTE: This sheet is a summary. It may not cover all possible information. If you have questions about this medicine, talk to your doctor, pharmacist, or health care provider.  2020 [employer]vier/[employer] Standard (2017-12-21 16:57:08)  

## 2020-08-15 NOTE — Patient Instructions (Signed)
[facility] Discharge Instructions for Patients Receiving Chemotherapy  Today you received the following chemotherapy agents bevacizumab, oxaliplatin, leucovorin, Fluorourcil.   To help prevent nausea and vomiting after your treatment, we encourage you to take your nausea medication as directed.    If you develop nausea and vomiting that is not controlled by your nausea medication, call the clinic.   BELOW ARE SYMPTOMS THAT SHOULD BE REPORTED IMMEDIATELY:  *FEVER GREATER THAN 100.5 F  *CHILLS WITH OR WITHOUT FEVER  NAUSEA AND VOMITING THAT IS NOT CONTROLLED WITH YOUR NAUSEA MEDICATION  *UNUSUAL SHORTNESS OF BREATH  *UNUSUAL BRUISING OR BLEEDING  TENDERNESS IN MOUTH AND THROAT WITH OR WITHOUT PRESENCE OF ULCERS  *URINARY PROBLEMS  *BOWEL PROBLEMS  UNUSUAL RASH Items with * indicate a potential emergency and should be followed up as soon as possible.  Feel free to call the clinic should you have any questions or concerns. The clinic phone number is [phone].  Please show the [place] at check-in to the Emergency Department and triage nurse.

## 2020-08-16 ENCOUNTER — Telehealth: Payer: Self-pay | Admitting: Hematology

## 2020-08-16 ENCOUNTER — Encounter: Payer: Self-pay | Admitting: Hematology

## 2020-08-16 NOTE — Telephone Encounter (Signed)
Scheduled per 11/17 los. Noted to give pt appt calendar.

## 2020-08-17 ENCOUNTER — Other Ambulatory Visit: Payer: Self-pay

## 2020-08-17 ENCOUNTER — Other Ambulatory Visit: Payer: Self-pay | Admitting: Hematology

## 2020-08-17 ENCOUNTER — Inpatient Hospital Stay: Payer: BC Managed Care – PPO

## 2020-08-17 VITALS — BP 109/73 | HR 78 | Temp 98.5°F | Resp 18

## 2020-08-17 DIAGNOSIS — Z95828 Presence of other vascular implants and grafts: Secondary | ICD-10-CM

## 2020-08-17 DIAGNOSIS — C19 Malignant neoplasm of rectosigmoid junction: Secondary | ICD-10-CM

## 2020-08-17 MED ORDER — SODIUM CHLORIDE 0.9% FLUSH
10.0000 mL | Freq: Once | INTRAVENOUS | Status: AC
  Administered 2020-08-17: 10 mL
  Filled 2020-08-17: qty 10

## 2020-08-17 MED ORDER — HEPARIN SOD (PORK) LOCK FLUSH 100 UNIT/ML IV SOLN
500.0000 [IU] | Freq: Once | INTRAVENOUS | Status: AC
  Administered 2020-08-17: 500 [IU]
  Filled 2020-08-17: qty 5

## 2020-08-17 MED ORDER — PEGFILGRASTIM-CBQV 6 MG/0.6ML ~~LOC~~ SOSY
PREFILLED_SYRINGE | SUBCUTANEOUS | Status: AC
  Filled 2020-08-17: qty 0.6

## 2020-08-17 MED ORDER — SODIUM CHLORIDE 0.9 % IV SOLN
Freq: Once | INTRAVENOUS | Status: AC
  Filled 2020-08-17: qty 250

## 2020-08-17 MED ORDER — PEGFILGRASTIM-CBQV 6 MG/0.6ML ~~LOC~~ SOSY
6.0000 mg | PREFILLED_SYRINGE | Freq: Once | SUBCUTANEOUS | Status: AC
  Administered 2020-08-17: 6 mg via SUBCUTANEOUS

## 2020-08-17 NOTE — Patient Instructions (Signed)
Rehydration, Adult Rehydration is the replacement of body fluids and salts and minerals (electrolytes) that are lost during dehydration. Dehydration is when there is not enough fluid or water in the body. This happens when you lose more fluids than you take in. Common causes of dehydration include:  Vomiting.  Diarrhea.  Excessive sweating, such as from heat exposure or exercise.  Taking medicines that cause the body to lose excess fluid (diuretics).  Impaired kidney function.  Not drinking enough fluid.  Certain illnesses or infections.  Certain poorly controlled long-term (chronic) illnesses, such as diabetes, heart disease, and kidney disease.  Symptoms of mild dehydration may include thirst, dry lips and mouth, dry skin, and dizziness. Symptoms of severe dehydration may include increased heart rate, confusion, fainting, and not urinating. You can rehydrate by drinking certain fluids or getting fluids through an IV tube, as told by your health care provider. What are the risks? Generally, rehydration is safe. However, one problem that can happen is taking in too much fluid (overhydration). This is rare. If overhydration happens, it can cause an electrolyte imbalance, kidney failure, or a decrease in salt (sodium) levels in the body. How to rehydrate Follow instructions from your health care provider for rehydration. The kind of fluid you should drink and the amount you should drink depend on your condition.  If directed by your health care provider, drink an oral rehydration solution (ORS). This is a drink designed to treat dehydration that is found in pharmacies and retail stores. ? Make an ORS by following instructions on the package. ? Start by drinking small amounts, about  cup (120 mL) every 5-10 minutes. ? Slowly increase how much you drink until you have taken the amount recommended by your health care provider.  Drink enough clear fluids to keep your urine clear or pale  yellow. If you were instructed to drink an ORS, finish the ORS first, then start slowly drinking other clear fluids. Drink fluids such as: ? Water. Do not drink only water. Doing that can lead to having too little sodium in your body (hyponatremia). ? Ice chips. ? Fruit juice that you have added water to (diluted juice). ? Low-calorie sports drinks.  If you are severely dehydrated, your health care provider may recommend that you receive fluids through an IV tube in the hospital.  Do not take sodium tablets. Doing that can lead to the condition of having too much sodium in your body (hypernatremia). Eating while you rehydrate Follow instructions from your health care provider about what to eat while you rehydrate. Your health care provider may recommend that you slowly begin eating regular foods in small amounts.  Eat foods that contain a healthy balance of electrolytes, such as bananas, oranges, potatoes, tomatoes, and spinach.  Avoid foods that are greasy or contain a lot of fat or sugar.  In some cases, you may get nutrition through a feeding tube that is passed through your nose and into your stomach (nasogastric tube, or NG tube). This may be done if you have uncontrolled vomiting or diarrhea. Beverages to avoid Certain beverages may make dehydration worse. While you rehydrate, avoid:  Alcohol.  Caffeine.  Drinks that contain a lot of sugar. These include: ? High-calorie sports drinks. ? Fruit juice that is not diluted. ? Soda.  Check nutrition labels to see how much sugar or caffeine a beverage contains. Signs of dehydration recovery You may be recovering from dehydration if:  You are urinating more often than before you started   rehydrating.  Your urine is clear or pale yellow.  Your energy level improves.  You vomit less frequently.  You have diarrhea less frequently.  Your appetite improves or returns to normal.  You feel less dizzy or less light-headed.  Your  skin tone and color start to look more normal. Contact a health care provider if:  You continue to have symptoms of mild dehydration, such as: ? Thirst. ? Dry lips. ? Slightly dry mouth. ? Dry, warm skin. ? Dizziness.  You continue to vomit or have diarrhea. Get help right away if:  You have symptoms of dehydration that get worse.  You feel: ? Confused. ? Weak. ? Like you are going to faint.  You have not urinated in 6-8 hours.  You have very dark urine.  You have trouble breathing.  Your heart rate while sitting still is over 100 beats a minute.  You cannot drink fluids without vomiting.  You have vomiting or diarrhea that: ? Gets worse. ? Does not go away.  You have a fever. This information is not intended to replace advice given to you by your health care provider. Make sure you discuss any questions you have with your health care provider. Document Revised: 08/28/2017 Document Reviewed: 11/09/2015 Elsevier Patient Education  [place].  Pegfilgrastim injection What is this medicine? PEGFILGRASTIM (PEG fil gra stim) is a long-acting granulocyte colony-stimulating factor that stimulates the growth of neutrophils, a type of white blood cell important in the body's fight against infection. It is used to reduce the incidence of fever and infection in patients with certain types of cancer who are receiving chemotherapy that affects the bone marrow, and to increase survival after being exposed to high doses of radiation. This medicine may be used for other purposes; ask your health care provider or pharmacist if you have questions. COMMON BRAND NAME(S): Steve Rattler, Ziextenzo What should I tell my health care provider before I take this medicine? They need to know if you have any of these conditions:  kidney disease  latex allergy  ongoing radiation therapy  sickle cell disease  skin reactions to acrylic adhesives (On-Body Injector  only)  an unusual or allergic reaction to pegfilgrastim, filgrastim, other medicines, foods, dyes, or preservatives  pregnant or trying to get pregnant  breast-feeding How should I use this medicine? This medicine is for injection under the skin. If you get this medicine at home, you will be taught how to prepare and give the pre-filled syringe or how to use the On-body Injector. Refer to the patient Instructions for Use for detailed instructions. Use exactly as directed. Tell your healthcare provider immediately if you suspect that the On-body Injector may not have performed as intended or if you suspect the use of the On-body Injector resulted in a missed or partial dose. It is important that you put your used needles and syringes in a special sharps container. Do not put them in a trash can. If you do not have a sharps container, call your pharmacist or healthcare provider to get one. Talk to your pediatrician regarding the use of this medicine in children. While this drug may be prescribed for selected conditions, precautions do apply. Overdosage: If you think you have taken too much of this medicine contact a poison control center or emergency room at once. NOTE: This medicine is only for you. Do not share this medicine with others. What if I miss a dose? It is important not to miss your dose.  Call your doctor or health care professional if you miss your dose. If you miss a dose due to an On-body Injector failure or leakage, a new dose should be administered as soon as possible using a single prefilled syringe for manual use. What may interact with this medicine? Interactions have not been studied. Give your health care provider a list of all the medicines, herbs, non-prescription drugs, or dietary supplements you use. Also tell them if you smoke, drink alcohol, or use illegal drugs. Some items may interact with your medicine. This list may not describe all possible interactions. Give your  health care provider a list of all the medicines, herbs, non-prescription drugs, or dietary supplements you use. Also tell them if you smoke, drink alcohol, or use illegal drugs. Some items may interact with your medicine. What should I watch for while using this medicine? You may need blood work done while you are taking this medicine. If you are going to need a MRI, CT scan, or other procedure, tell your doctor that you are using this medicine (On-Body Injector only). What side effects may I notice from receiving this medicine? Side effects that you should report to your doctor or health care professional as soon as possible:  allergic reactions like skin rash, itching or hives, swelling of the face, lips, or tongue  back pain  dizziness  fever  pain, redness, or irritation at site where injected  pinpoint red spots on the skin  red or dark-brown urine  shortness of breath or breathing problems  stomach or side pain, or pain at the shoulder  swelling  tiredness  trouble passing urine or change in the amount of urine Side effects that usually do not require medical attention (report to your doctor or health care professional if they continue or are bothersome):  bone pain  muscle pain This list may not describe all possible side effects. Call your doctor for medical advice about side effects. You may report side effects to FDA at 1-800-FDA-1088. Where should I keep my medicine? Keep out of the reach of children. If you are using this medicine at home, you will be instructed on how to store it. Throw away any unused medicine after the expiration date on the label. NOTE: This sheet is a summary. It may not cover all possible information. If you have questions about this medicine, talk to your doctor, pharmacist, or health care provider.  2020 Elsevier/[employer] Standard (2017-12-21 16:57:08)

## 2020-08-21 ENCOUNTER — Other Ambulatory Visit: Payer: Self-pay | Admitting: Nurse Practitioner

## 2020-08-21 ENCOUNTER — Encounter: Payer: Self-pay | Admitting: Nurse Practitioner

## 2020-08-21 ENCOUNTER — Telehealth: Payer: Self-pay

## 2020-08-21 MED ORDER — VALACYCLOVIR HCL 500 MG PO TABS: 500.0000 mg | ORAL_TABLET | Freq: Two times a day (BID) | ORAL | 0 refills | Status: DC

## 2020-08-21 NOTE — Telephone Encounter (Signed)
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   She states she has 1 open sore on her labia that is painful to touch.  She is asking if there is a medication for this.

## 2020-08-27 NOTE — Progress Notes (Signed)
[place]   [phone] Fax:([phone]   Clinic Follow up Note   Patient Care Team: Patient, [name] as PCP - General (General Practice) [name], RN as Oncology Nurse Navigator [name], NP as Nurse Practitioner (Nurse Practitioner) [name], MD as Consulting Physician (Oncology)  Date of Service:  08/29/2020  CHIEF COMPLAINT:  f/u of metastatic colon cancer  SUMMARY OF ONCOLOGIC HISTORY: Oncology History Overview Note  Cancer Staging Malignant neoplasm of rectosigmoid junction [facility]) Staging form: Colon and Rectum, AJCC 8th Edition - Pathologic stage from 04/04/2020: pT4a, pN1b, cM1 - Signed by [name], NP on 05/07/2020    Malignant neoplasm of rectosigmoid junction ([place])  03/12/2020 Initial Diagnosis   Malignant neoplasm of rectosigmoid junction ([place])   03/12/2020 Imaging   CT AP with contrast IMPRESSION: 1. Overall findings are highly concerning for colorectal carcinoma involving the sigmoid colon with an associated perforation and adjacent abscess and phlegmon formation as detailed above. Currently, no collection is amenable to percutaneous drainage given their small size and location. 2. New 2 cm mass in the right hepatic lobe concerning for metastatic disease to the liver until proven otherwise. 3. Enlarged regional lymph nodes as detailed above is concerning for nodal metastatic disease. 4. Large stool burden. 5. Prominent pelvic veins which can be seen in patients with pelvic congestion syndrome.   03/13/2020 Imaging   ABD US IMPRESSION: Approximately 2.1 x 2.4 x 2.0 cm lobular homogeneously echogenic lesion in the right hepatic dome corresponds with the abnormality seen on the prior CT scan. Sonographically, this appearance is highly suggestive of a benign hemangioma.   Recommend MRI of the abdomen with gadolinium contrast which may provide a noninvasive diagnosis of benign hemangioma.    03/13/2020  Imaging   MR ABD W/WO CONTRAST Hepatobiliary: Diffuse low signal intensity throughout the hepatic parenchyma on T2 weighted images, presumably a consequence of recent Feraheme injection. In segment 7 of the liver (axial image 8 of series 5) there is a 2.5 x 1.9 cm well-defined lesion which is slightly T2 hyperintense. This lesion appears hyperintense on pre gadolinium T1 weighted images (likely a consequence of Feraheme). Interpretation of enhancement within the lesion is compromised by presence of Feraheme. No other hepatic lesions are confidently identified on today's examination. No intra or extrahepatic biliary ductal dilatation. Gallbladder is normal in appearance.   03/31/2020 Imaging   CT AP W contrast IMPRESSION: 1. Previously noted sigmoid colon mass appears increased in size, and again appears to be associated with a focal contained perforation which crosses the midline and has fistulized into the left adnexal region where there is now what appears to be a large left tubo-ovarian abscess, as detailed above. This is also associated with multiple enlarged lymph nodes in the pelvis measuring up to 1.2 cm in short axis and borderline enlarged retroperitoneal lymph nodes, concerning for metastatic disease. In addition, previously suspected metastatic lesion in segment 7 of the liver has enlarged. 2. Small volume of ascites. 3. Additional incidental findings, as above.   04/04/2020 Cancer Staging   Staging form: Colon and Rectum, AJCC 8th Edition - Pathologic stage from 04/04/2020: pT4a, pN1b, cM1 - Signed by [name], NP on 05/07/2020   04/04/2020 Procedure   Paracentesis, path showed no malignant cells (mixed acute and chronic inflammation present)   04/04/2020 Surgery   Open sigmoid colectomy and end colostomy by Dr. [name]   04/04/2020 Pathology Results   FINAL MICROSCOPIC DIAGNOSIS: A.  COLON, RECTOSIGMOID, RESECTION: - Invasive moderately differentiated  adenocarcinoma, 6 cm, involving rectosigmoid junction - Carcinoma invades into serosal surface with perforation and associated serositis - Radial resection margin is positive for carcinoma; proximal and distal margins are not involved - Lymphovascular invasion is present - Metastatic carcinoma to one of fifteen lymph nodes (1/15); one tumor deposit - See oncology table B. LYMPH NODES, MESENTERIC, RESECTION: - Metastatic adenocarcinoma to one of six lymph nodes (1/6) - One tumor deposit  Addendum to note 2 involved lymph nodes (of 21 examined nodes) pT4a,pN1b MMR-normal, preserved expression of MLH1, MSH2, MSH6, PMS2   04/12/2020 Procedure   PAC placement    04/13/2020 Imaging   CT chest without contrast IMPRESSION: Interval development of bilateral pleural effusions, left slightly greater than right, with resultant bibasilar atelectasis including subtotal collapse of the left lower lobe. No evidence of intrathoracic metastatic disease, though evaluation of the collapsed parenchyma is limited. Hepatic metastasis again demonstrated.     04/16/2020 Pathology Results   FINAL MICROSCOPIC DIAGNOSIS:  A. LIVER, RIGHT LOBE, BIOPSY:  - Adenocarcinoma.  COMMENT:  The morphology is compatible with the provided clinical history of colorectal carcinoma.    05/23/2020 -  Chemotherapy   FOLFIRINOX q2weeks for 3-6 months starting 05/23/20. Bevacizumab-bvzr [name] added with C2.    08/05/2020 Imaging   CT AP  IMPRESSION: 1. Postsurgical changes of distal colectomy with a left lower quadrant end ostomy. No evidence of obstruction or acute complication at this time. Excluded rectal pouch in the deep pelvis without acute complication or worrisome features. 2. Slight interval decrease in size of a hypoattenuating lesion posterior right lobe liver measuring 1.7 x 1.8 x 2 cm. This lesion has previously undergone ultrasound-guided biopsy with pathologic results demonstrating adenocarcinoma  compatible with metastatic disease from patient's resected colorectal carcinoma. 3. Slight prominence of the parametrial vessels bilaterally, nonspecific though can be seen in the setting of pelvic congestion syndrome. 4. Mild splenomegaly.  No focal lesion.        CURRENT THERAPY:  FOLFIRINOX q2weeks for 3-6 months starting 05/23/20.Bevacizumab-bvzr (Zirabev)added with C2.  INTERVAL HISTORY:  LILLYMAE DUET is here for a follow up. She presents to the clinic alone. She notes her last cycle chemo went well. She did have prolonged fatigue. She notes her cold sensitivity is manageable. She denies neuropathy. She notes on chemo she has not had her period. She notes starting to have hot flashes. She plans to f/u with Dr Barry Dienes next week. She notes she has been eating more fruits and vegetables and less carbs. She notes although she eats more she still may lose weight.    REVIEW OF SYSTEMS:   Constitutional: Denies fevers, chills or abnormal weight loss (+) Hot flashes  Eyes: Denies blurriness of vision Ears, nose, mouth, throat, and face: Denies mucositis or sore throat Respiratory: Denies cough, dyspnea or wheezes Cardiovascular: Denies palpitation, chest discomfort or lower extremity swelling Gastrointestinal:  Denies nausea, heartburn or change in bowel habits Skin: Denies abnormal skin rashes Lymphatics: Denies new lymphadenopathy or easy bruising Neurological:Denies numbness, tingling or new weaknesses Behavioral/Psych: Mood is stable, no new changes  All other systems were reviewed with the patient and are negative.  MEDICAL HISTORY:  Past Medical History:  Diagnosis Date  . BV (bacterial vaginosis)   . Cancer ([place])   . Lactose intolerance 03/12/2020  . UTI (lower urinary tract infection)     SURGICAL HISTORY: Past Surgical History:  Procedure Laterality Date  . CESAREAN SECTION    .  CYSTOSCOPY WITH STENT PLACEMENT  04/04/2020   Procedure: CYSTOSCOPY WITH STENT  PLACEMENT;  Surgeon: [name], MD;  Location: [facility];  Service: General;;  . LAPAROTOMY N/A 04/04/2020   Procedure: EXPLORATORY LAPAROTOMY;  Surgeon: [name], MD;  Location: [facility];  Service: General;  Laterality: N/A;  . PORTACATH PLACEMENT Right 04/12/2020   Procedure: INSERTION PORT-A-CATH WITH ULTRASOUND;  Surgeon: Kieth Brightly Arta Bruce, MD;  Location: [facility];  Service: General;  Laterality: Right;    I have reviewed the social history and family history with the patient and they are unchanged from previous note.  ALLERGIES:  has No Known Allergies.  MEDICATIONS:  Current Outpatient Medications  Medication Sig Dispense Refill  . acetaminophen (TYLENOL) 325 MG tablet Take 650 mg by mouth daily as needed for fever or headache (pain).    . busPIRone (BUSPAR) 10 MG tablet Take 10 mg by mouth 2 (two) times daily as needed (anxiety).     [name] diclofenac Sodium (VOLTAREN) 1 % GEL Apply 2 g topically 4 (four) times daily. (Patient not taking: Reported on 08/05/2020) 50 g 0  . dicyclomine (BENTYL) 10 MG capsule Take 1 capsule (10 mg total) by mouth 3 (three) times daily before meals. (Patient taking differently: Take 10 mg by mouth 2 (two) times daily as needed (stomach cramps). ) 60 capsule 1  . famotidine (PEPCID) 20 MG tablet Take 1 tablet (20 mg total) by mouth 2 (two) times daily. (Patient not taking: Reported on 08/05/2020) 60 tablet 0  . ferrous sulfate 325 (65 FE) MG tablet Take 1 tablet (325 mg total) by mouth 2 (two) times daily with a meal. (Patient not taking: Reported on 08/05/2020) 60 tablet 0  . lidocaine-prilocaine (EMLA) cream Apply to affected area once (Patient taking differently: Apply 1 application topically as needed (prior to port access). ) 30 g 3  . ondansetron (ZOFRAN) 8 MG tablet Take 1 tablet (8 mg total) by mouth 2 (two) times daily as needed. Start on day 3 after chemotherapy. (Patient taking differently: Take 8 mg by mouth 2 (two) times daily as needed for nausea or  vomiting. Start on day 3 after chemotherapy.) 30 tablet 5  . pantoprazole (PROTONIX) 40 MG tablet Take 1 tablet (40 mg total) by mouth 2 (two) times daily. 60 tablet 5  . PARAGARD INTRAUTERINE COPPER IU 1 Device by Intrauterine route once.     . polyethylene glycol (MIRALAX / GLYCOLAX) 17 g packet Take 17 g by mouth 2 (two) times daily. (Patient taking differently: Take 17 g by mouth 2 (two) times daily as needed (constipation). ) 14 each 0  . potassium chloride SA (KLOR-CON) 20 MEQ tablet Take 1 tablet (20 mEq total) by mouth daily. (Patient not taking: Reported on 08/05/2020) 30 tablet 1  . prochlorperazine (COMPAZINE) 10 MG tablet Take 1 tablet (10 mg total) by mouth every 6 (six) hours as needed (Nausea or vomiting). 30 tablet 5  . senna-docusate (SENOKOT-S) 8.6-50 MG tablet Take 2 tablets by mouth 2 (two) times daily. (Patient taking differently: Take 2 tablets by mouth 2 (two) times daily as needed (constipation). ) 120 tablet 0  . traMADol (ULTRAM) 50 MG tablet Take 1 tablet (50 mg total) by mouth every 12 (twelve) hours as needed for severe pain. 30 tablet 0  . valACYclovir (VALTREX) 500 MG tablet Take 1 tablet (500 mg total) by mouth 2 (two) times daily. 30 tablet 0   No current facility-administered medications for this visit.   Facility-Administered Medications Ordered  in Other Visits  Medication Dose Route Frequency Provider Last Rate Last Admin  . fluorouracil (ADRUCIL) 5,000 mg in sodium chloride 0.9 % 150 mL chemo infusion  2,675 mg/m2 (Treatment Plan Recorded) Intravenous 1 day or 1 dose [name], MD   5,000 mg at 08/29/20 1617    PHYSICAL EXAMINATION: ECOG PERFORMANCE STATUS: 1 - Symptomatic but completely ambulatory  Vitals:   08/29/20 1014  BP: 113/80  Pulse: 93  Resp: 19  SpO2: 99%   Filed Weights   08/29/20 1014  Weight: 148 lb 4.8 oz (67.3 kg)    Due to COVID19 we will limit examination to appearance. Patient had no complaints.  GENERAL:alert, no distress and  comfortable SKIN: skin color normal, no rashes or significant lesions EYES: normal, Conjunctiva are pink and non-injected, sclera clear  NEURO: alert & oriented x 3 with fluent speech   LABORATORY DATA:  I have reviewed the data as listed CBC Latest Ref Rng & Units 08/29/2020 08/15/2020 08/05/2020  WBC 4.0 - 10.5 K/uL 7.4 10.6(H) 38.4(H)  Hemoglobin 12.0 - 15.0 g/dL 13.7 13.5 14.0  Hematocrit 36 - 46 % 41.1 40.2 41.7  Platelets 150 - 400 K/uL 117(L) 127(L) 91(L)     CMP Latest Ref Rng & Units 08/29/2020 08/15/2020 08/05/2020  Glucose 70 - 99 mg/dL 86 100(H) 88  BUN 6 - 20 mg/dL _0 Creatinine 0.44 - 1.00 mg/dL 0.67 0.64 0.53  Sodium 135 - 145 mmol/L 141 141 138  Potassium 3.5 - 5.1 mmol/L 4.2 4.0 3.6  Chloride 98 - 111 mmol/L 107 105 104  CO2 22 - 32 mmol/L _1 Calcium 8.9 - 10.3 mg/dL 9.6 9.0 9.0  Total Protein 6.5 - 8.1 g/dL 7.2 6.8 7.2  Total Bilirubin 0.3 - 1.2 mg/dL 0.3 0.2(L) 0.4  Alkaline Phos 38 - 126 U/L 101 108 140(H)  AST 15 - 41 U/L _2 ALT 0 - 44 U/L _3 RADIOGRAPHIC STUDIES: I have personally reviewed the radiological images as listed and agreed with the findings in the report. No results found.   ASSESSMENT & PLAN:  Stephanie [name] is a 39 y.o. 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 Barry Dienes on 04/04/20. She was found to have perforation and positive radial margin.Liver biopsy on 04/16/2020 confirmed metastatic disease from her colon cancer -Work up isconsistent with stage IV colon cancer s/p surgical resection of the primary tumor with oligo liver metastasis, overall the disease burden appears to be low.  -Istarted her onfirst line chemo withFOLFOXIRI q2 weeks for3-73month beginning 05/23/20. Bevacizumab-bvzr  (Zirabev)added with C2. -If she has dramatic response with limited residual metastatic disease and no other distant metastasis, she may be a candidate for liver resection or targeted liver therapy such as ablation. -S/p C6 she went to ED for suspected fever. She had CT AP same day which showed good partial response to chemo so far, her liver lesion has decreased and no new lesions. I again reviewed and discussed with patient today.  -S/p C7 she continues to tolerate treatment moderately well with occasional diarrhea and prolonged fatigue and hot flashes. Labs reviewed, Plt 117K. Overall adequate to proceed with C8 FOLFIRINOX and Zirabev today.  -I briefly discussed repeating scan after C12 chemo and proceeding with liver  surgery or liver target therapy such as ablation. She will f/u with Dr Barry Dienes next week. -f/u in 2 weeks    2. Mild nausea, diarrhea/constipation, Acid Reflux, secondary to chemo  -She can use Zofran or compazine as needed.  -She can use Miralax for constipation and imodium for diarrhea as needed.She has magnesium citrate which she can use if Miralax is not enough. -For Acid Reflux, will continue Protonix.  -If she develops any caner related pain, she still has Tramadol.  -Stable with medication management.   3.Anemia, secondary to #1 and iron deficiencyfrom GI Blood loss. Resolved s/p IVFeraheme in 6/2021and8/13/21. -Iron panel still pending today (08/29/20).   4. Financial assistance  -She notes she stopped working from home given her troubles to work with cold sensitivity. -She has been approved for disability, and applied for SS, medicaid, and cone grant   Plan: -Labs reviewed and adequate to proceed with C8FOLFOXIRI andZirabevtoday at same dose,Udenyca on day 3 -Lab, flush, f/u and FOLFIRINOXandZirabevin 2, 4, 6 and 8 weeks, will hold Zirabev for last two cycle in anticipating surgery    No problem-specific Assessment & Plan notes found for  this encounter.   No orders of the defined types were placed in this encounter.  All questions were answered. The patient knows to call the clinic with any problems, questions or concerns. No barriers to learning was detected. The total time spent in the appointment was 30 minutes.     [name], MD 08/29/2020   I, Joslyn Devon, am acting as scribe for [name], MD.   I have reviewed the above documentation for accuracy and completeness, and I agree with the above.

## 2020-08-29 ENCOUNTER — Inpatient Hospital Stay: Payer: BC Managed Care – PPO | Attending: Nurse Practitioner

## 2020-08-29 ENCOUNTER — Inpatient Hospital Stay (HOSPITAL_BASED_OUTPATIENT_CLINIC_OR_DEPARTMENT_OTHER): Payer: BC Managed Care – PPO | Admitting: Hematology

## 2020-08-29 ENCOUNTER — Encounter: Payer: Self-pay | Admitting: Hematology

## 2020-08-29 ENCOUNTER — Inpatient Hospital Stay: Payer: BC Managed Care – PPO

## 2020-08-29 ENCOUNTER — Other Ambulatory Visit: Payer: Self-pay

## 2020-08-29 VITALS — BP 113/80 | HR 93 | Resp 19 | Ht 69.0 in | Wt 148.3 lb

## 2020-08-29 DIAGNOSIS — R110 Nausea: Secondary | ICD-10-CM | POA: Diagnosis not present

## 2020-08-29 DIAGNOSIS — D500 Iron deficiency anemia secondary to blood loss (chronic): Secondary | ICD-10-CM | POA: Diagnosis not present

## 2020-08-29 DIAGNOSIS — Z5112 Encounter for antineoplastic immunotherapy: Secondary | ICD-10-CM | POA: Insufficient documentation

## 2020-08-29 DIAGNOSIS — C19 Malignant neoplasm of rectosigmoid junction: Secondary | ICD-10-CM | POA: Diagnosis not present

## 2020-08-29 DIAGNOSIS — Z95828 Presence of other vascular implants and grafts: Secondary | ICD-10-CM

## 2020-08-29 DIAGNOSIS — Z5189 Encounter for other specified aftercare: Secondary | ICD-10-CM | POA: Insufficient documentation

## 2020-08-29 DIAGNOSIS — C787 Secondary malignant neoplasm of liver and intrahepatic bile duct: Secondary | ICD-10-CM | POA: Diagnosis not present

## 2020-08-29 DIAGNOSIS — Z79899 Other long term (current) drug therapy: Secondary | ICD-10-CM | POA: Insufficient documentation

## 2020-08-29 DIAGNOSIS — Z5111 Encounter for antineoplastic chemotherapy: Secondary | ICD-10-CM | POA: Insufficient documentation

## 2020-08-29 LAB — CBC WITH DIFFERENTIAL (CANCER CENTER ONLY)
Abs Immature Granulocytes: 0.03 10*3/uL (ref 0.00–0.07)
Basophils Absolute: 0 10*3/uL (ref 0.0–0.1)
Basophils Relative: 1 %
Eosinophils Absolute: 0.1 10*3/uL (ref 0.0–0.5)
Eosinophils Relative: 2 %
HCT: 41.1 % (ref 36.0–46.0)
Hemoglobin: 13.7 g/dL (ref 12.0–15.0)
Immature Granulocytes: 0 %
Lymphocytes Relative: 17 %
Lymphs Abs: 1.3 10*3/uL (ref 0.7–4.0)
MCH: 30.4 pg (ref 26.0–34.0)
MCHC: 33.3 g/dL (ref 30.0–36.0)
MCV: 91.1 fL (ref 80.0–100.0)
Monocytes Absolute: 0.8 10*3/uL (ref 0.1–1.0)
Monocytes Relative: 10 %
Neutro Abs: 5.2 10*3/uL (ref 1.7–7.7)
Neutrophils Relative %: 70 %
Platelet Count: 117 10*3/uL — ABNORMAL LOW (ref 150–400)
RBC: 4.51 MIL/uL (ref 3.87–5.11)
RDW: 16.7 % — ABNORMAL HIGH (ref 11.5–15.5)
WBC Count: 7.4 10*3/uL (ref 4.0–10.5)
nRBC: 0 % (ref 0.0–0.2)

## 2020-08-29 LAB — IRON AND TIBC
Iron: 78 ug/dL (ref 41–142)
Saturation Ratios: 25 % (ref 21–57)
TIBC: 319 ug/dL (ref 236–444)
UIBC: 241 ug/dL (ref 120–384)

## 2020-08-29 LAB — CMP (CANCER CENTER ONLY)
ALT: 21 U/L (ref 0–44)
AST: 16 U/L (ref 15–41)
Albumin: 4 g/dL (ref 3.5–5.0)
Alkaline Phosphatase: 101 U/L (ref 38–126)
Anion gap: 11 (ref 5–15)
BUN: 8 mg/dL (ref 6–20)
CO2: 23 mmol/L (ref 22–32)
Calcium: 9.6 mg/dL (ref 8.9–10.3)
Chloride: 107 mmol/L (ref 98–111)
Creatinine: 0.67 mg/dL (ref 0.44–1.00)
GFR, Estimated: >60 mL/min (ref >60)
Glucose, Bld: 86 mg/dL (ref 70–99)
Potassium: 4.2 mmol/L (ref 3.5–5.1)
Sodium: 141 mmol/L (ref 135–145)
Total Bilirubin: 0.3 mg/dL (ref 0.3–1.2)
Total Protein: 7.2 g/dL (ref 6.5–8.1)

## 2020-08-29 LAB — FERRITIN: Ferritin: 179 ng/mL (ref 11–307)

## 2020-08-29 LAB — TOTAL PROTEIN, URINE DIPSTICK: Protein, ur: NEGATIVE mg/dL

## 2020-08-29 LAB — CEA (IN HOUSE-CHCC): CEA (CHCC-In House): 3.01 ng/mL (ref 0.00–5.00)

## 2020-08-29 MED ORDER — PALONOSETRON HCL INJECTION 0.25 MG/5ML
INTRAVENOUS | Status: AC
  Filled 2020-08-29: qty 5

## 2020-08-29 MED ORDER — SODIUM CHLORIDE 0.9% FLUSH
10.0000 mL | Freq: Once | INTRAVENOUS | Status: AC
  Administered 2020-08-29: 10 mL
  Filled 2020-08-29: qty 10

## 2020-08-29 MED ORDER — SODIUM CHLORIDE 0.9 % IV SOLN
Freq: Once | INTRAVENOUS | Status: AC
  Filled 2020-08-29: qty 250

## 2020-08-29 MED ORDER — SODIUM CHLORIDE 0.9 % IV SOLN
2675.0000 mg/m2 | INTRAVENOUS | Status: DC
  Administered 2020-08-29: 5000 mg via INTRAVENOUS
  Filled 2020-08-29: qty 100

## 2020-08-29 MED ORDER — OXALIPLATIN CHEMO INJECTION 100 MG/20ML
85.0000 mg/m2 | Freq: Once | INTRAVENOUS | Status: AC
  Administered 2020-08-29: 160 mg via INTRAVENOUS
  Filled 2020-08-29: qty 32

## 2020-08-29 MED ORDER — PALONOSETRON HCL INJECTION 0.25 MG/5ML
0.2500 mg | Freq: Once | INTRAVENOUS | Status: AC
  Administered 2020-08-29: 0.25 mg via INTRAVENOUS

## 2020-08-29 MED ORDER — ATROPINE SULFATE 1 MG/ML IJ SOLN
INTRAMUSCULAR | Status: AC
  Filled 2020-08-29: qty 1

## 2020-08-29 MED ORDER — SODIUM CHLORIDE 0.9 % IV SOLN
4.5000 mg/kg | Freq: Once | INTRAVENOUS | Status: AC
  Administered 2020-08-29: 300 mg via INTRAVENOUS
  Filled 2020-08-29: qty 12

## 2020-08-29 MED ORDER — SODIUM CHLORIDE 0.9 % IV SOLN
165.0000 mg/m2 | Freq: Once | INTRAVENOUS | Status: AC
  Administered 2020-08-29: 300 mg via INTRAVENOUS
  Filled 2020-08-29: qty 15

## 2020-08-29 MED ORDER — ATROPINE SULFATE 1 MG/ML IJ SOLN
0.5000 mg | Freq: Once | INTRAMUSCULAR | Status: AC | PRN
  Administered 2020-08-29: 0.5 mg via INTRAVENOUS

## 2020-08-29 MED ORDER — SODIUM CHLORIDE 0.9 % IV SOLN
10.0000 mg | Freq: Once | INTRAVENOUS | Status: AC
  Administered 2020-08-29: 10 mg via INTRAVENOUS
  Filled 2020-08-29: qty 10

## 2020-08-29 MED ORDER — DEXTROSE 5 % IV SOLN
Freq: Once | INTRAVENOUS | Status: AC
  Filled 2020-08-29: qty 250

## 2020-08-29 MED ORDER — SODIUM CHLORIDE 0.9 % IV SOLN
150.0000 mg | Freq: Once | INTRAVENOUS | Status: AC
  Administered 2020-08-29: 150 mg via INTRAVENOUS
  Filled 2020-08-29: qty 150

## 2020-08-29 MED ORDER — LEUCOVORIN CALCIUM INJECTION 350 MG
200.0000 mg/m2 | Freq: Once | INTRAVENOUS | Status: AC
  Administered 2020-08-29: 374 mg via INTRAVENOUS
  Filled 2020-08-29: qty 18.7

## 2020-08-29 NOTE — Patient Instructions (Signed)

## 2020-08-29 NOTE — Patient Instructions (Signed)
[place] Discharge Instructions for Patients Receiving Chemotherapy  Today you received the following chemotherapy agents: Avastin, Oxaliplatin, Leucovorin, Irinotecan, Fluorouracil  To help prevent nausea and vomiting after your treatment, we encourage you to take your nausea medication  as prescribed.    If you develop nausea and vomiting that is not controlled by your nausea medication, call the clinic.   BELOW ARE SYMPTOMS THAT SHOULD BE REPORTED IMMEDIATELY:  *FEVER GREATER THAN 100.5 F  *CHILLS WITH OR WITHOUT FEVER  NAUSEA AND VOMITING THAT IS NOT CONTROLLED WITH YOUR NAUSEA MEDICATION  *UNUSUAL SHORTNESS OF BREATH  *UNUSUAL BRUISING OR BLEEDING  TENDERNESS IN MOUTH AND THROAT WITH OR WITHOUT PRESENCE OF ULCERS  *URINARY PROBLEMS  *BOWEL PROBLEMS  UNUSUAL RASH Items with * indicate a potential emergency and should be followed up as soon as possible.  Feel free to call the clinic should you have any questions or concerns. The clinic phone number is [phone].  Please show the [facility] at check-in to the Emergency Department and triage nurse.

## 2020-08-29 NOTE — Progress Notes (Signed)
Pt stable for d/c, ambulatory, steady gait.  Pt aware to return at 2pm on Friday for IVF along with her pump d/c per MD Burr Medico.

## 2020-08-30 ENCOUNTER — Telehealth: Payer: Self-pay | Admitting: Hematology

## 2020-08-30 NOTE — Telephone Encounter (Signed)
Scheduled appts per 12/1 los. Pt to get updated appt calendar at next visit per appt notes.

## 2020-08-31 ENCOUNTER — Inpatient Hospital Stay: Payer: BC Managed Care – PPO

## 2020-08-31 ENCOUNTER — Other Ambulatory Visit: Payer: Self-pay

## 2020-08-31 VITALS — BP 126/85 | HR 88 | Temp 98.7°F | Resp 18 | Ht 69.0 in | Wt 152.1 lb

## 2020-08-31 DIAGNOSIS — Z95828 Presence of other vascular implants and grafts: Secondary | ICD-10-CM

## 2020-08-31 DIAGNOSIS — C19 Malignant neoplasm of rectosigmoid junction: Secondary | ICD-10-CM

## 2020-08-31 MED ORDER — SODIUM CHLORIDE 0.9 % IV SOLN
Freq: Once | INTRAVENOUS | Status: AC
  Filled 2020-08-31: qty 250

## 2020-08-31 MED ORDER — PEGFILGRASTIM-CBQV 6 MG/0.6ML ~~LOC~~ SOSY
6.0000 mg | PREFILLED_SYRINGE | Freq: Once | SUBCUTANEOUS | Status: AC
  Administered 2020-08-31: 6 mg via SUBCUTANEOUS

## 2020-08-31 MED ORDER — SODIUM CHLORIDE 0.9% FLUSH
10.0000 mL | INTRAVENOUS | Status: DC | PRN
  Administered 2020-08-31: 10 mL
  Filled 2020-08-31: qty 10

## 2020-08-31 MED ORDER — PEGFILGRASTIM-CBQV 6 MG/0.6ML ~~LOC~~ SOSY
PREFILLED_SYRINGE | SUBCUTANEOUS | Status: AC
  Filled 2020-08-31: qty 0.6

## 2020-08-31 MED ORDER — HEPARIN SOD (PORK) LOCK FLUSH 100 UNIT/ML IV SOLN
500.0000 [IU] | Freq: Once | INTRAVENOUS | Status: AC | PRN
  Administered 2020-08-31: 500 [IU]
  Filled 2020-08-31: qty 5

## 2020-08-31 NOTE — Patient Instructions (Addendum)
Rehydration, Adult Rehydration is the replacement of body fluids and salts and minerals (electrolytes) that are lost during dehydration. Dehydration is when there is not enough fluid or water in the body. This happens when you lose more fluids than you take in. Common causes of dehydration include:  Vomiting.  Diarrhea.  Excessive sweating, such as from heat exposure or exercise.  Taking medicines that cause the body to lose excess fluid (diuretics).  Impaired kidney function.  Not drinking enough fluid.  Certain illnesses or infections.  Certain poorly controlled long-term (chronic) illnesses, such as diabetes, heart disease, and kidney disease.  Symptoms of mild dehydration may include thirst, dry lips and mouth, dry skin, and dizziness. Symptoms of severe dehydration may include increased heart rate, confusion, fainting, and not urinating. You can rehydrate by drinking certain fluids or getting fluids through an IV tube, as told by your health care provider. What are the risks? Generally, rehydration is safe. However, one problem that can happen is taking in too much fluid (overhydration). This is rare. If overhydration happens, it can cause an electrolyte imbalance, kidney failure, or a decrease in salt (sodium) levels in the body. How to rehydrate Follow instructions from your health care provider for rehydration. The kind of fluid you should drink and the amount you should drink depend on your condition.  If directed by your health care provider, drink an oral rehydration solution (ORS). This is a drink designed to treat dehydration that is found in pharmacies and retail stores. ? Make an ORS by following instructions on the package. ? Start by drinking small amounts, about  cup (120 mL) every 5-10 minutes. ? Slowly increase how much you drink until you have taken the amount recommended by your health care provider.  Drink enough clear fluids to keep your urine clear or pale  yellow. If you were instructed to drink an ORS, finish the ORS first, then start slowly drinking other clear fluids. Drink fluids such as: ? Water. Do not drink only water. Doing that can lead to having too little sodium in your body (hyponatremia). ? Ice chips. ? Fruit juice that you have added water to (diluted juice). ? Low-calorie sports drinks.  If you are severely dehydrated, your health care provider may recommend that you receive fluids through an IV tube in the hospital.  Do not take sodium tablets. Doing that can lead to the condition of having too much sodium in your body (hypernatremia). Eating while you rehydrate Follow instructions from your health care provider about what to eat while you rehydrate. Your health care provider may recommend that you slowly begin eating regular foods in small amounts.  Eat foods that contain a healthy balance of electrolytes, such as bananas, oranges, potatoes, tomatoes, and spinach.  Avoid foods that are greasy or contain a lot of fat or sugar.  In some cases, you may get nutrition through a feeding tube that is passed through your nose and into your stomach (nasogastric tube, or NG tube). This may be done if you have uncontrolled vomiting or diarrhea. Beverages to avoid Certain beverages may make dehydration worse. While you rehydrate, avoid:  Alcohol.  Caffeine.  Drinks that contain a lot of sugar. These include: ? High-calorie sports drinks. ? Fruit juice that is not diluted. ? Soda.  Check nutrition labels to see how much sugar or caffeine a beverage contains. Signs of dehydration recovery You may be recovering from dehydration if:  You are urinating more often than before you started   rehydrating.  Your urine is clear or pale yellow.  Your energy level improves.  You vomit less frequently.  You have diarrhea less frequently.  Your appetite improves or returns to normal.  You feel less dizzy or less light-headed.  Your  skin tone and color start to look more normal. Contact a health care provider if:  You continue to have symptoms of mild dehydration, such as: ? Thirst. ? Dry lips. ? Slightly dry mouth. ? Dry, warm skin. ? Dizziness.  You continue to vomit or have diarrhea. Get help right away if:  You have symptoms of dehydration that get worse.  You feel: ? Confused. ? Weak. ? Like you are going to faint.  You have not urinated in 6-8 hours.  You have very dark urine.  You have trouble breathing.  Your heart rate while sitting still is over 100 beats a minute.  You cannot drink fluids without vomiting.  You have vomiting or diarrhea that: ? Gets worse. ? Does not go away.  You have a fever. This information is not intended to replace advice given to you by your health care provider. Make sure you discuss any questions you have with your health care provider. Document Revised: 08/28/2017 Document Reviewed: 11/09/2015 [employer] Patient Education  [employer]. Pegfilgrastim injection What is this medicine? PEGFILGRASTIM (PEG fil gra stim) is a long-acting granulocyte colony-stimulating factor that stimulates the growth of neutrophils, a type of white blood cell important in the body's fight against infection. It is used to reduce the incidence of fever and infection in patients with certain types of cancer who are receiving chemotherapy that affects the bone marrow, and to increase survival after being exposed to high doses of radiation. This medicine may be used for other purposes; ask your health care provider or pharmacist if you have questions. COMMON BRAND NAME(S): Steve Rattler, Ziextenzo What should I tell my health care provider before I take this medicine? They need to know if you have any of these conditions: kidney disease latex allergy ongoing radiation therapy sickle cell disease skin reactions to acrylic adhesives (On-Body Injector only) an unusual  or allergic reaction to pegfilgrastim, filgrastim, other medicines, foods, dyes, or preservatives pregnant or trying to get pregnant breast-feeding How should I use this medicine? This medicine is for injection under the skin. If you get this medicine at home, you will be taught how to prepare and give the pre-filled syringe or how to use the On-body Injector. Refer to the patient Instructions for Use for detailed instructions. Use exactly as directed. Tell your healthcare provider immediately if you suspect that the On-body Injector may not have performed as intended or if you suspect the use of the On-body Injector resulted in a missed or partial dose. It is important that you put your used needles and syringes in a special sharps container. Do not put them in a trash can. If you do not have a sharps container, call your pharmacist or healthcare provider to get one. Talk to your pediatrician regarding the use of this medicine in children. While this drug may be prescribed for selected conditions, precautions do apply. Overdosage: If you think you have taken too much of this medicine contact a poison control center or emergency room at once. NOTE: This medicine is only for you. Do not share this medicine with others. What if I miss a dose? It is important not to miss your dose. Call your doctor or health care professional if you  miss your dose. If you miss a dose due to an On-body Injector failure or leakage, a new dose should be administered as soon as possible using a single prefilled syringe for manual use. What may interact with this medicine? Interactions have not been studied. Give your health care provider a list of all the medicines, herbs, non-prescription drugs, or dietary supplements you use. Also tell them if you smoke, drink alcohol, or use illegal drugs. Some items may interact with your medicine. This list may not describe all possible interactions. Give your health care provider a list  of all the medicines, herbs, non-prescription drugs, or dietary supplements you use. Also tell them if you smoke, drink alcohol, or use illegal drugs. Some items may interact with your medicine. What should I watch for while using this medicine? You may need blood work done while you are taking this medicine. If you are going to need a MRI, CT scan, or other procedure, tell your doctor that you are using this medicine (On-Body Injector only). What side effects may I notice from receiving this medicine? Side effects that you should report to your doctor or health care professional as soon as possible: allergic reactions like skin rash, itching or hives, swelling of the face, lips, or tongue back pain dizziness fever pain, redness, or irritation at site where injected pinpoint red spots on the skin red or dark-brown urine shortness of breath or breathing problems stomach or side pain, or pain at the shoulder swelling tiredness trouble passing urine or change in the amount of urine Side effects that usually do not require medical attention (report to your doctor or health care professional if they continue or are bothersome): bone pain muscle pain This list may not describe all possible side effects. Call your doctor for medical advice about side effects. You may report side effects to FDA at 1-800-FDA-1088. Where should I keep my medicine? Keep out of the reach of children. If you are using this medicine at home, you will be instructed on how to store it. Throw away any unused medicine after the expiration date on the label. NOTE: This sheet is a summary. It may not cover all possible information. If you have questions about this medicine, talk to your doctor, pharmacist, or health care provider.  2020 Elsevier/[employer] Standard (2017-12-21 16:57:08)

## 2020-09-11 NOTE — Progress Notes (Addendum)
[place]   [phone] Fax:([phone]   Clinic Follow up Note   Patient Care Team: Patient, [name] as PCP - General (General Practice) Stephanie Finner, RN as Oncology Nurse Navigator Stephanie Feeling, NP as Nurse Practitioner (Nurse Practitioner) Stephanie Merle, MD as Consulting Physician (Oncology) Stephanie Klein, MD as Consulting Physician (General Surgery) 09/12/2020  CHIEF COMPLAINT: Follow-up metastatic colon cancer  SUMMARY OF ONCOLOGIC HISTORY: Oncology History Overview Note  Cancer Staging Malignant neoplasm of rectosigmoid junction [facility]) Staging form: Colon and Rectum, AJCC 8th Edition - Pathologic stage from 04/04/2020: pT4a, pN1b, cM1 - Signed by Stephanie Feeling, NP on 05/07/2020    Malignant neoplasm of rectosigmoid junction ([place])  03/12/2020 Initial Diagnosis   Malignant neoplasm of rectosigmoid junction ([place])   03/12/2020 Imaging   CT AP with contrast IMPRESSION: 1. Overall findings are highly concerning for colorectal carcinoma involving the sigmoid colon with an associated perforation and adjacent abscess and phlegmon formation as detailed above. Currently, no collection is amenable to percutaneous drainage given their small size and location. 2. New 2 cm mass in the right hepatic lobe concerning for metastatic disease to the liver until proven otherwise. 3. Enlarged regional lymph nodes as detailed above is concerning for nodal metastatic disease. 4. Large stool burden. 5. Prominent pelvic veins which can be seen in patients with pelvic congestion syndrome.   03/13/2020 Imaging   ABD US IMPRESSION: Approximately 2.1 x 2.4 x 2.0 cm lobular homogeneously echogenic lesion in the right hepatic dome corresponds with the abnormality seen on the prior CT scan. Sonographically, this appearance is highly suggestive of a benign hemangioma.   Recommend MRI of the abdomen with gadolinium contrast which may provide a noninvasive  diagnosis of benign hemangioma.    03/13/2020 Imaging   MR ABD W/WO CONTRAST Hepatobiliary: Diffuse low signal intensity throughout the hepatic parenchyma on T2 weighted images, presumably a consequence of recent Feraheme injection. In segment 7 of the liver (axial image 8 of series 5) there is a 2.5 x 1.9 cm well-defined lesion which is slightly T2 hyperintense. This lesion appears hyperintense on pre gadolinium T1 weighted images (likely a consequence of Feraheme). Interpretation of enhancement within the lesion is compromised by presence of Feraheme. No other hepatic lesions are confidently identified on today's examination. No intra or extrahepatic biliary ductal dilatation. Gallbladder is normal in appearance.   03/31/2020 Imaging   CT AP W contrast IMPRESSION: 1. Previously noted sigmoid colon mass appears increased in size, and again appears to be associated with a focal contained perforation which crosses the midline and has fistulized into the left adnexal region where there is now what appears to be a large left tubo-ovarian abscess, as detailed above. This is also associated with multiple enlarged lymph nodes in the pelvis measuring up to 1.2 cm in short axis and borderline enlarged retroperitoneal lymph nodes, concerning for metastatic disease. In addition, previously suspected metastatic lesion in segment 7 of the liver has enlarged. 2. Small volume of ascites. 3. Additional incidental findings, as above.   04/04/2020 Cancer Staging   Staging form: Colon and Rectum, AJCC 8th Edition - Pathologic stage from 04/04/2020: pT4a, pN1b, cM1 - Signed by Stephanie Feeling, NP on 05/07/2020   04/04/2020 Procedure   Paracentesis, path showed no malignant cells (mixed acute and chronic inflammation present)   04/04/2020 Surgery   Open sigmoid colectomy and end colostomy by Dr. Stark [name]   04/04/2020 Pathology Results   FINAL MICROSCOPIC DIAGNOSIS:  A. COLON, RECTOSIGMOID, RESECTION: -  Invasive moderately differentiated adenocarcinoma, 6 cm, involving rectosigmoid junction - Carcinoma invades into serosal surface with perforation and associated serositis - Radial resection margin is positive for carcinoma; proximal and distal margins are not involved - Lymphovascular invasion is present - Metastatic carcinoma to one of fifteen lymph nodes (1/15); one tumor deposit - See oncology table B. LYMPH NODES, MESENTERIC, RESECTION: - Metastatic adenocarcinoma to one of six lymph nodes (1/6) - One tumor deposit  Addendum to note 2 involved lymph nodes (of 21 examined nodes) pT4a,pN1b MMR-normal, preserved expression of MLH1, MSH2, MSH6, PMS2   04/12/2020 Procedure   PAC placement    04/13/2020 Imaging   CT chest without contrast IMPRESSION: Interval development of bilateral pleural effusions, left slightly greater than right, with resultant bibasilar atelectasis including subtotal collapse of the left lower lobe. No evidence of intrathoracic metastatic disease, though evaluation of the collapsed parenchyma is limited. Hepatic metastasis again demonstrated.     04/16/2020 Pathology Results   FINAL MICROSCOPIC DIAGNOSIS:  A. LIVER, RIGHT LOBE, BIOPSY:  - Adenocarcinoma.  COMMENT:  The morphology is compatible with the provided clinical history of colorectal carcinoma.    05/23/2020 -  Chemotherapy   FOLFIRINOX q2weeks for 3-6 months starting 05/23/20. Bevacizumab-bvzr Stephanie [name]) added with C2.    08/05/2020 Imaging   CT AP  IMPRESSION: 1. Postsurgical changes of distal colectomy with a left lower quadrant end ostomy. No evidence of obstruction or acute complication at this time. Excluded rectal pouch in the deep pelvis without acute complication or worrisome features. 2. Slight interval decrease in size of a hypoattenuating lesion posterior right lobe liver measuring 1.7 x 1.8 x 2 cm. This lesion has previously undergone ultrasound-guided biopsy with pathologic results  demonstrating adenocarcinoma compatible with metastatic disease from patient's resected colorectal carcinoma. 3. Slight prominence of the parametrial vessels bilaterally, nonspecific though can be seen in the setting of pelvic congestion syndrome. 4. Mild splenomegaly.  No focal lesion.       CURRENT THERAPY: FOLFIRINOX q2weeks for 3-6 months starting 05/23/20.Bevacizumab-bvzr (Zirabev)added with C2.  INTERVAL HISTORY: Stephanie [name] returns for follow-up and treatment as scheduled.  She received another cycle of FOLFIRINOX and bevacizumab on 08/29/2020.  She is having more fatigue that takes longer to recover now almost for 2 weeks between treatments.  She is able to remain functional with more effort.  Cold sensitivity lasts 1 week with mild residual tingling in the absence of cold exposure.  She had one episode where her left lower leg was numb briefly.  She is able to remain functional.  She had one transient episode of chest pain while driving shortly after she took a BuSpar for anxiety.  This is happened in the past.  She had no associated symptoms of cough, dizziness, dyspnea at the time.  She manages anxiety with meditation and prayer.  Her mild diarrhea is managed with Imodium.  No nausea/vomiting, bloody stool, recent fever, chills, mucositis, hand-foot syndrome, or pain.   MEDICAL HISTORY:  Past Medical History:  Diagnosis Date  . BV (bacterial vaginosis)   . Cancer ([place])   . Lactose intolerance 03/12/2020  . UTI (lower urinary tract infection)     SURGICAL HISTORY: Past Surgical History:  Procedure Laterality Date  . CESAREAN SECTION    . CYSTOSCOPY WITH STENT PLACEMENT  04/04/2020   Procedure: CYSTOSCOPY WITH STENT PLACEMENT;  Surgeon: Stephanie Klein, MD;  Location: [facility];  Service: General;;  . LAPAROTOMY N/A 04/04/2020   Procedure: EXPLORATORY  LAPAROTOMY;  Surgeon: Stephanie Klein, MD;  Location: [facility];  Service: General;  Laterality: N/A;  . PORTACATH PLACEMENT Right  04/12/2020   Procedure: INSERTION PORT-A-CATH WITH ULTRASOUND;  Surgeon: Kieth Brightly Arta Bruce, MD;  Location: [facility];  Service: General;  Laterality: Right;    I have reviewed the social history and family history with the patient and they are unchanged from previous note.  ALLERGIES:  has No Known Allergies.  MEDICATIONS:  Current Outpatient Medications  Medication Sig Dispense Refill  . acetaminophen (TYLENOL) 325 MG tablet Take 650 mg by mouth daily as needed for fever or headache (pain).    [name] diclofenac Sodium (VOLTAREN) 1 % GEL Apply 2 g topically 4 (four) times daily. (Patient not taking: Reported on 08/05/2020) 50 g 0  . dicyclomine (BENTYL) 10 MG capsule Take 1 capsule (10 mg total) by mouth 3 (three) times daily before meals. (Patient taking differently: Take 10 mg by mouth 2 (two) times daily as needed (stomach cramps). ) 60 capsule 1  . famotidine (PEPCID) 20 MG tablet Take 1 tablet (20 mg total) by mouth 2 (two) times daily. (Patient not taking: Reported on 08/05/2020) 60 tablet 0  . ferrous sulfate 325 (65 FE) MG tablet Take 1 tablet (325 mg total) by mouth 2 (two) times daily with a meal. (Patient not taking: Reported on 08/05/2020) 60 tablet 0  . lidocaine-prilocaine (EMLA) cream Apply to affected area once (Patient taking differently: Apply 1 application topically as needed (prior to port access). ) 30 g 3  . LORazepam (ATIVAN) 0.5 MG tablet Take 1 tablet (0.5 mg total) by mouth 2 (two) times daily as needed for anxiety. 30 tablet 0  . ondansetron (ZOFRAN) 8 MG tablet Take 1 tablet (8 mg total) by mouth 2 (two) times daily as needed. Start on day 3 after chemotherapy. (Patient taking differently: Take 8 mg by mouth 2 (two) times daily as needed for nausea or vomiting. Start on day 3 after chemotherapy.) 30 tablet 5  . pantoprazole (PROTONIX) 40 MG tablet Take 1 tablet (40 mg total) by mouth 2 (two) times daily. 60 tablet 5  . PARAGARD INTRAUTERINE COPPER IU 1 Device by Intrauterine  route once.     . polyethylene glycol (MIRALAX / GLYCOLAX) 17 g packet Take 17 g by mouth 2 (two) times daily. (Patient taking differently: Take 17 g by mouth 2 (two) times daily as needed (constipation). ) 14 each 0  . potassium chloride SA (KLOR-CON) 20 MEQ tablet Take 1 tablet (20 mEq total) by mouth daily. (Patient not taking: Reported on 08/05/2020) 30 tablet 1  . prochlorperazine (COMPAZINE) 10 MG tablet Take 1 tablet (10 mg total) by mouth every 6 (six) hours as needed (Nausea or vomiting). 30 tablet 5  . senna-docusate (SENOKOT-S) 8.6-50 MG tablet Take 2 tablets by mouth 2 (two) times daily. (Patient taking differently: Take 2 tablets by mouth 2 (two) times daily as needed (constipation). ) 120 tablet 0  . traMADol (ULTRAM) 50 MG tablet Take 1 tablet (50 mg total) by mouth every 12 (twelve) hours as needed for severe pain. 30 tablet 0  . valACYclovir (VALTREX) 500 MG tablet Take 1 tablet (500 mg total) by mouth 2 (two) times daily. 30 tablet 0   No current facility-administered medications for this visit.   Facility-Administered Medications Ordered in Other Visits  Medication Dose Route Frequency Provider Last Rate Last Admin  . 0.9 %  sodium chloride infusion   Intravenous Once Stephanie Merle, MD      .  atropine injection 0.5 mg  0.5 mg Intravenous Once PRN Stephanie Merle, MD      . bevacizumab-bvzr (ZIRABEV) 350 mg in sodium chloride 0.9 % 100 mL chemo infusion  5 mg/kg (Treatment Plan Recorded) Intravenous Once Stephanie Merle, MD      . dexamethasone (DECADRON) 10 mg in sodium chloride 0.9 % 50 mL IVPB  10 mg Intravenous Once Stephanie Merle, MD      . dextrose 5 % solution   Intravenous Once Stephanie Merle, MD      . dextrose 5 % solution   Intravenous Once Stephanie Merle, MD      . fluorouracil (ADRUCIL) 4,500 mg in sodium chloride 0.9 % 60 mL chemo infusion  2,400 mg/m2 (Treatment Plan Recorded) Intravenous 1 day or 1 dose Stephanie Merle, MD      . fosaprepitant (EMEND) 150 mg in sodium chloride 0.9 % 145 mL IVPB  150  mg Intravenous Once Stephanie Merle, MD      . irinotecan (CAMPTOSAR) 300 mg in sodium chloride 0.9 % 500 mL chemo infusion  165 mg/m2 (Treatment Plan Recorded) Intravenous Once Stephanie Merle, MD      . leucovorin 374 mg in dextrose 5 % 250 mL infusion  200 mg/m2 (Treatment Plan Recorded) Intravenous Once Stephanie Merle, MD      . oxaliplatin (ELOXATIN) 110 mg in dextrose 5 % 500 mL chemo infusion  60 mg/m2 (Treatment Plan Recorded) Intravenous Once Stephanie Merle, MD      . palonosetron (ALOXI) injection 0.25 mg  0.25 mg Intravenous Once Stephanie Merle, MD        PHYSICAL EXAMINATION: ECOG PERFORMANCE STATUS: 1-2  Vitals:   09/12/20 0841  BP: 103/77  Pulse: 85  Resp: 13  Temp: 97.8 F (36.6 C)  SpO2: 100%   Filed Weights   09/12/20 0841  Weight: 147 lb 11.2 oz (67 kg)    GENERAL:alert, no distress and comfortable SKIN: No rash.  Palms without erythema EYES: sclera clear NECK: Without mass LUNGS: clear with normal breathing effort HEART: regular rate & rhythm, no lower extremity edema ABDOMEN:abdomen soft, non-tender and normal bowel sounds. LLW colostomy  NEURO: alert & oriented x 3 with fluent speech, no focal sensory deficit over the fingertips per tuning fork exam PAC without erythema  LABORATORY DATA:  I have reviewed the data as listed CBC Latest Ref Rng & Units 09/12/2020 08/29/2020 08/15/2020  WBC 4.0 - 10.5 K/uL 6.7 7.4 10.6(H)  Hemoglobin 12.0 - 15.0 g/dL 13.6 13.7 13.5  Hematocrit 36.0 - 46.0 % 41.1 41.1 40.2  Platelets 150 - 400 K/uL 93(L) 117(L) 127(L)     CMP Latest Ref Rng & Units 09/12/2020 08/29/2020 08/15/2020  Glucose 70 - 99 mg/dL 99 86 100(H)  BUN 6 - 20 mg/dL _0 Creatinine 0.44 - 1.00 mg/dL 0.83 0.67 0.64  Sodium 135 - 145 mmol/L 141 141 141  Potassium 3.5 - 5.1 mmol/L 3.8 4.2 4.0  Chloride 98 - 111 mmol/L 108 107 105  CO2 22 - 32 mmol/L _1 Calcium 8.9 - 10.3 mg/dL 9.4 9.6 9.0  Total Protein 6.5 - 8.1 g/dL 7.3 7.2 6.8  Total Bilirubin 0.3 - 1.2 mg/dL 0.4  0.3 0.2(L)  Alkaline Phos 38 - 126 U/L 111 101 108  AST 15 - 41 U/L _2 ALT 0 - 44 U/L 35 21 18      RADIOGRAPHIC STUDIES: I have personally reviewed the radiological images as listed and agreed with  the findings in the report. No results found.   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 Barry Dienes on 7/7/21she was found to have perforation and positive radial margin.Liver biopsy on 04/16/2020 confirmed metastatic disease from her colon cancer -We again reviewedthis isstage IV colon cancer s/p surgical resection of the primary tumor with oligo liver metastasis, overall the disease burden appears to be low. We discussed in this case when the patient is young and fit we treat aggressively to potentially cure her disease which is achievable and approximately 20% of patients with multimodality therapies. -Dr. Burr Medico is recommending systemic treatment with FOLFOXIRI q2 weeks for3-6 months.If she has dramatic response with limited residual metastatic disease and no other distant metastasis, she may be a candidate for liver resection or targeted liver therapy such as ablation -FoundationOnewhich shows K-ras mutation (+), she is not a candidate for EGFR inhibitor.  Avastin was added with cycle 2 -S/p cycle 6 she went to ED for fever, CTAP showed good partial response to chemo, liver lesion had decreased in no new or progressive disease. -S/p cycle 8 FOLFOXIRI and bevacizumab, tolerating moderately well with fatigue, mild diarrhea, and early neuropathy -The plan is to restage after cycle 12, if she continues to have good response in the liver and no progressive disease she will see Dr. Barry Dienes to consider liver  resection surgery.  We will hold bevacizumab prior to surgery  2.  Mild nausea, diarrhea/constipation, acid reflux secondary to chemo -After cycle 2 -Added dicyclomine and Protonix, GERD improved -Managed with supportive care at home  3. Intrabdominal abscess secondary to sigmoid colon perforation -Culture showed staph epidermidis, treated with cefepime and fagyl  4.Anemia, secondary to #1 and iron deficiency -She had mild intermittent blood in her stool for the past several months, attributed to hemorrhoids -S/p IV Feraheme in 02/2020 during hospitalization, Ferritin improved to 124 -Denies recurrent bleeding lately -Not currently on oral iron,anemia resolved after surgery. She received second dose Feraheme on 05/11/20 -OK to resume daily women's viamin with Iron -Resolved  5. Social/financial  -she has been approved for disability, and applied for SS, medicaid, and cone grant  Disposition: Stephanie [name] appears stable.  She has completed 8 cycles of FOLFOXIRI and bevacizumab.  She continues to tolerate treatment well overall, with prolonged fatigue and early signs of neuropathy.  She remains functional.  Diarrhea is managed with supportive meds at home.  Overall she is able to recover and function well.  We discussed her anxiety, she will continue to manage with meditation and prayer. She had a chest pain episode after BuSpar which she has had in the past.  I recommend to stop BuSpar and try Ativan as needed, a prescription was sent to her pharmacy today. She declined referral to chaplain.  Labs reviewed, CMP is normal.  CBC shows progressive thrombocytopenia platelet 93K.  Urine pregnancy test not done but she is not sexually active.  The plan is to proceed with cycle 9 FOLFOXIRI and bevacizumab today as planned, will dose reduce oxaliplatin and 5FU for thrombocytopenia and neuropathy. She will get IVF with pump d/c.   She is scheduled to see Dr. Barry Dienes on 12/27, follow-up with  me and next cycle in 2 weeks.  The plan was reviewed with Dr. Burr Medico.  All questions were  answered. The patient knows to call the clinic with any problems, questions or concerns. No barriers to learning were detected.     Stephanie Feeling, NP 09/12/20

## 2020-09-12 ENCOUNTER — Inpatient Hospital Stay (HOSPITAL_BASED_OUTPATIENT_CLINIC_OR_DEPARTMENT_OTHER): Payer: BC Managed Care – PPO | Admitting: Nurse Practitioner

## 2020-09-12 ENCOUNTER — Encounter: Payer: Self-pay | Admitting: Nurse Practitioner

## 2020-09-12 ENCOUNTER — Inpatient Hospital Stay: Payer: BC Managed Care – PPO

## 2020-09-12 ENCOUNTER — Other Ambulatory Visit: Payer: Self-pay

## 2020-09-12 VITALS — BP 106/64 | HR 68 | Temp 98.2°F | Resp 18

## 2020-09-12 VITALS — BP 103/77 | HR 85 | Temp 97.8°F | Resp 13 | Ht 69.0 in | Wt 147.7 lb

## 2020-09-12 DIAGNOSIS — C19 Malignant neoplasm of rectosigmoid junction: Secondary | ICD-10-CM

## 2020-09-12 DIAGNOSIS — Z95828 Presence of other vascular implants and grafts: Secondary | ICD-10-CM

## 2020-09-12 LAB — CMP (CANCER CENTER ONLY)
ALT: 35 U/L (ref 0–44)
AST: 24 U/L (ref 15–41)
Albumin: 4.1 g/dL (ref 3.5–5.0)
Alkaline Phosphatase: 111 U/L (ref 38–126)
Anion gap: 9 (ref 5–15)
BUN: 8 mg/dL (ref 6–20)
CO2: 24 mmol/L (ref 22–32)
Calcium: 9.4 mg/dL (ref 8.9–10.3)
Chloride: 108 mmol/L (ref 98–111)
Creatinine: 0.83 mg/dL (ref 0.44–1.00)
GFR, Estimated: >60 mL/min (ref >60)
Glucose, Bld: 99 mg/dL (ref 70–99)
Potassium: 3.8 mmol/L (ref 3.5–5.1)
Sodium: 141 mmol/L (ref 135–145)
Total Bilirubin: 0.4 mg/dL (ref 0.3–1.2)
Total Protein: 7.3 g/dL (ref 6.5–8.1)

## 2020-09-12 LAB — CBC WITH DIFFERENTIAL (CANCER CENTER ONLY)
Abs Immature Granulocytes: 0.01 10*3/uL (ref 0.00–0.07)
Basophils Absolute: 0 10*3/uL (ref 0.0–0.1)
Basophils Relative: 0 %
Eosinophils Absolute: 0.1 10*3/uL (ref 0.0–0.5)
Eosinophils Relative: 2 %
HCT: 41.1 % (ref 36.0–46.0)
Hemoglobin: 13.6 g/dL (ref 12.0–15.0)
Immature Granulocytes: 0 %
Lymphocytes Relative: 16 %
Lymphs Abs: 1.1 10*3/uL (ref 0.7–4.0)
MCH: 31.1 pg (ref 26.0–34.0)
MCHC: 33.1 g/dL (ref 30.0–36.0)
MCV: 93.8 fL (ref 80.0–100.0)
Monocytes Absolute: 0.6 10*3/uL (ref 0.1–1.0)
Monocytes Relative: 9 %
Neutro Abs: 4.9 10*3/uL (ref 1.7–7.7)
Neutrophils Relative %: 73 %
Platelet Count: 93 10*3/uL — ABNORMAL LOW (ref 150–400)
RBC: 4.38 MIL/uL (ref 3.87–5.11)
RDW: 16.2 % — ABNORMAL HIGH (ref 11.5–15.5)
WBC Count: 6.7 10*3/uL (ref 4.0–10.5)
nRBC: 0 % (ref 0.0–0.2)

## 2020-09-12 LAB — TOTAL PROTEIN, URINE DIPSTICK

## 2020-09-12 MED ORDER — DEXTROSE 5 % IV SOLN
Freq: Once | INTRAVENOUS | Status: AC
  Filled 2020-09-12: qty 250

## 2020-09-12 MED ORDER — ATROPINE SULFATE 1 MG/ML IJ SOLN
0.5000 mg | Freq: Once | INTRAMUSCULAR | Status: AC | PRN
  Administered 2020-09-12: 0.5 mg via INTRAVENOUS

## 2020-09-12 MED ORDER — SODIUM CHLORIDE 0.9% FLUSH
10.0000 mL | Freq: Once | INTRAVENOUS | Status: AC
  Administered 2020-09-12: 10 mL
  Filled 2020-09-12: qty 10

## 2020-09-12 MED ORDER — DEXTROSE 5 % IV SOLN
Freq: Once | INTRAVENOUS | Status: DC
  Filled 2020-09-12: qty 250

## 2020-09-12 MED ORDER — SODIUM CHLORIDE 0.9 % IV SOLN
150.0000 mg | Freq: Once | INTRAVENOUS | Status: AC
  Administered 2020-09-12: 150 mg via INTRAVENOUS
  Filled 2020-09-12: qty 150

## 2020-09-12 MED ORDER — PALONOSETRON HCL INJECTION 0.25 MG/5ML
INTRAVENOUS | Status: AC
  Filled 2020-09-12: qty 5

## 2020-09-12 MED ORDER — LORAZEPAM 0.5 MG PO TABS: 0.5000 mg | ORAL_TABLET | Freq: Two times a day (BID) | ORAL | 0 refills | Status: DC | PRN

## 2020-09-12 MED ORDER — SODIUM CHLORIDE 0.9 % IV SOLN
Freq: Once | INTRAVENOUS | Status: AC
  Filled 2020-09-12: qty 250

## 2020-09-12 MED ORDER — OXALIPLATIN CHEMO INJECTION 100 MG/20ML
60.0000 mg/m2 | Freq: Once | INTRAVENOUS | Status: AC
  Administered 2020-09-12: 110 mg via INTRAVENOUS
  Filled 2020-09-12: qty 20

## 2020-09-12 MED ORDER — PALONOSETRON HCL INJECTION 0.25 MG/5ML
0.2500 mg | Freq: Once | INTRAVENOUS | Status: AC
  Administered 2020-09-12: 0.25 mg via INTRAVENOUS

## 2020-09-12 MED ORDER — LEUCOVORIN CALCIUM INJECTION 350 MG
200.0000 mg/m2 | Freq: Once | INTRAVENOUS | Status: AC
  Administered 2020-09-12: 374 mg via INTRAVENOUS
  Filled 2020-09-12: qty 18.7

## 2020-09-12 MED ORDER — SODIUM CHLORIDE 0.9 % IV SOLN
165.0000 mg/m2 | Freq: Once | INTRAVENOUS | Status: AC
  Administered 2020-09-12: 300 mg via INTRAVENOUS
  Filled 2020-09-12: qty 15

## 2020-09-12 MED ORDER — SODIUM CHLORIDE 0.9 % IV SOLN
10.0000 mg | Freq: Once | INTRAVENOUS | Status: AC
  Administered 2020-09-12: 10 mg via INTRAVENOUS
  Filled 2020-09-12: qty 10

## 2020-09-12 MED ORDER — SODIUM CHLORIDE 0.9 % IV SOLN
2400.0000 mg/m2 | INTRAVENOUS | Status: DC
  Administered 2020-09-12: 4500 mg via INTRAVENOUS
  Filled 2020-09-12: qty 90

## 2020-09-12 MED ORDER — SODIUM CHLORIDE 0.9 % IV SOLN
4.5000 mg/kg | Freq: Once | INTRAVENOUS | Status: AC
  Administered 2020-09-12: 300 mg via INTRAVENOUS
  Filled 2020-09-12: qty 12

## 2020-09-12 MED ORDER — ATROPINE SULFATE 1 MG/ML IJ SOLN
INTRAMUSCULAR | Status: AC
  Filled 2020-09-12: qty 1

## 2020-09-12 NOTE — Addendum Note (Signed)
Addended by: [name] on: 09/12/2020 04:50 PM   Modules accepted: Orders

## 2020-09-12 NOTE — Patient Instructions (Signed)
[facility] Discharge Instructions for Patients Receiving Chemotherapy  Today you received the following chemotherapy agents Bevacizumab bvzr, irinotecan, oxaliplatin, leucovorin, 5FU  To help prevent nausea and vomiting after your treatment, we encourage you to take your nausea medication as directed   If you develop nausea and vomiting that is not controlled by your nausea medication, call the clinic.   BELOW ARE SYMPTOMS THAT SHOULD BE REPORTED IMMEDIATELY:  *FEVER GREATER THAN 100.5 F  *CHILLS WITH OR WITHOUT FEVER  NAUSEA AND VOMITING THAT IS NOT CONTROLLED WITH YOUR NAUSEA MEDICATION  *UNUSUAL SHORTNESS OF BREATH  *UNUSUAL BRUISING OR BLEEDING  TENDERNESS IN MOUTH AND THROAT WITH OR WITHOUT PRESENCE OF ULCERS  *URINARY PROBLEMS  *BOWEL PROBLEMS  UNUSUAL RASH Items with * indicate a potential emergency and should be followed up as soon as possible.  Feel free to call the clinic should you have any questions or concerns. The clinic phone number is [phone].  Please show the [facility] at check-in to the Emergency Department and triage nurse.

## 2020-09-14 ENCOUNTER — Other Ambulatory Visit: Payer: Self-pay

## 2020-09-14 ENCOUNTER — Inpatient Hospital Stay: Payer: BC Managed Care – PPO

## 2020-09-14 VITALS — BP 117/68 | HR 83 | Temp 99.1°F | Resp 18

## 2020-09-14 DIAGNOSIS — Z95828 Presence of other vascular implants and grafts: Secondary | ICD-10-CM

## 2020-09-14 DIAGNOSIS — C19 Malignant neoplasm of rectosigmoid junction: Secondary | ICD-10-CM | POA: Diagnosis not present

## 2020-09-14 MED ORDER — SODIUM CHLORIDE 0.9 % IV SOLN
Freq: Once | INTRAVENOUS | Status: AC
  Filled 2020-09-14: qty 250

## 2020-09-14 MED ORDER — HEPARIN SOD (PORK) LOCK FLUSH 100 UNIT/ML IV SOLN
500.0000 [IU] | Freq: Once | INTRAVENOUS | Status: AC | PRN
  Administered 2020-09-14: 500 [IU]
  Filled 2020-09-14: qty 5

## 2020-09-14 MED ORDER — SODIUM CHLORIDE 0.9% FLUSH
10.0000 mL | INTRAVENOUS | Status: DC | PRN
  Administered 2020-09-14: 10 mL
  Filled 2020-09-14: qty 10

## 2020-09-14 MED ORDER — PEGFILGRASTIM-CBQV 6 MG/0.6ML ~~LOC~~ SOSY
6.0000 mg | PREFILLED_SYRINGE | Freq: Once | SUBCUTANEOUS | Status: AC
  Administered 2020-09-14: 6 mg via SUBCUTANEOUS

## 2020-09-14 MED ORDER — PEGFILGRASTIM-CBQV 6 MG/0.6ML ~~LOC~~ SOSY
PREFILLED_SYRINGE | SUBCUTANEOUS | Status: AC
  Filled 2020-09-14: qty 0.6

## 2020-09-14 NOTE — Patient Instructions (Signed)
Rehydration, Adult Rehydration is the replacement of body fluids and salts and minerals (electrolytes) that are lost during dehydration. Dehydration is when there is not enough fluid or water in the body. This happens when you lose more fluids than you take in. Common causes of dehydration include:  Vomiting.  Diarrhea.  Excessive sweating, such as from heat exposure or exercise.  Taking medicines that cause the body to lose excess fluid (diuretics).  Impaired kidney function.  Not drinking enough fluid.  Certain illnesses or infections.  Certain poorly controlled long-term (chronic) illnesses, such as diabetes, heart disease, and kidney disease.  Symptoms of mild dehydration may include thirst, dry lips and mouth, dry skin, and dizziness. Symptoms of severe dehydration may include increased heart rate, confusion, fainting, and not urinating. You can rehydrate by drinking certain fluids or getting fluids through an IV tube, as told by your health care provider. What are the risks? Generally, rehydration is safe. However, one problem that can happen is taking in too much fluid (overhydration). This is rare. If overhydration happens, it can cause an electrolyte imbalance, kidney failure, or a decrease in salt (sodium) levels in the body. How to rehydrate Follow instructions from your health care provider for rehydration. The kind of fluid you should drink and the amount you should drink depend on your condition.  If directed by your health care provider, drink an oral rehydration solution (ORS). This is a drink designed to treat dehydration that is found in pharmacies and retail stores. ? Make an ORS by following instructions on the package. ? Start by drinking small amounts, about  cup (120 mL) every 5-10 minutes. ? Slowly increase how much you drink until you have taken the amount recommended by your health care provider.  Drink enough clear fluids to keep your urine clear or pale  yellow. If you were instructed to drink an ORS, finish the ORS first, then start slowly drinking other clear fluids. Drink fluids such as: ? Water. Do not drink only water. Doing that can lead to having too little sodium in your body (hyponatremia). ? Ice chips. ? Fruit juice that you have added water to (diluted juice). ? Low-calorie sports drinks.  If you are severely dehydrated, your health care provider may recommend that you receive fluids through an IV tube in the hospital.  Do not take sodium tablets. Doing that can lead to the condition of having too much sodium in your body (hypernatremia). Eating while you rehydrate Follow instructions from your health care provider about what to eat while you rehydrate. Your health care provider may recommend that you slowly begin eating regular foods in small amounts.  Eat foods that contain a healthy balance of electrolytes, such as bananas, oranges, potatoes, tomatoes, and spinach.  Avoid foods that are greasy or contain a lot of fat or sugar.  In some cases, you may get nutrition through a feeding tube that is passed through your nose and into your stomach (nasogastric tube, or NG tube). This may be done if you have uncontrolled vomiting or diarrhea. Beverages to avoid Certain beverages may make dehydration worse. While you rehydrate, avoid:  Alcohol.  Caffeine.  Drinks that contain a lot of sugar. These include: ? High-calorie sports drinks. ? Fruit juice that is not diluted. ? Soda.  Check nutrition labels to see how much sugar or caffeine a beverage contains. Signs of dehydration recovery You may be recovering from dehydration if:  You are urinating more often than before you started   rehydrating.  Your urine is clear or pale yellow.  Your energy level improves.  You vomit less frequently.  You have diarrhea less frequently.  Your appetite improves or returns to normal.  You feel less dizzy or less light-headed.  Your  skin tone and color start to look more normal. Contact a health care provider if:  You continue to have symptoms of mild dehydration, such as: ? Thirst. ? Dry lips. ? Slightly dry mouth. ? Dry, warm skin. ? Dizziness.  You continue to vomit or have diarrhea. Get help right away if:  You have symptoms of dehydration that get worse.  You feel: ? Confused. ? Weak. ? Like you are going to faint.  You have not urinated in 6-8 hours.  You have very dark urine.  You have trouble breathing.  Your heart rate while sitting still is over 100 beats a minute.  You cannot drink fluids without vomiting.  You have vomiting or diarrhea that: ? Gets worse. ? Does not go away.  You have a fever. This information is not intended to replace advice given to you by your health care provider. Make sure you discuss any questions you have with your health care provider. Document Revised: 08/28/2017 Document Reviewed: 11/09/2015 [employer] Patient Education  [employer].  Pegfilgrastim injection What is this medicine? PEGFILGRASTIM (PEG fil gra stim) is a long-acting granulocyte colony-stimulating factor that stimulates the growth of neutrophils, a type of white blood cell important in the body's fight against infection. It is used to reduce the incidence of fever and infection in patients with certain types of cancer who are receiving chemotherapy that affects the bone marrow, and to increase survival after being exposed to high doses of radiation. This medicine may be used for other purposes; ask your health care provider or pharmacist if you have questions. COMMON BRAND NAME(S): Steve Rattler, Ziextenzo What should I tell my health care provider before I take this medicine? They need to know if you have any of these conditions:  kidney disease  latex allergy  ongoing radiation therapy  sickle cell disease  skin reactions to acrylic adhesives (On-Body Injector  only)  an unusual or allergic reaction to pegfilgrastim, filgrastim, other medicines, foods, dyes, or preservatives  pregnant or trying to get pregnant  breast-feeding How should I use this medicine? This medicine is for injection under the skin. If you get this medicine at home, you will be taught how to prepare and give the pre-filled syringe or how to use the On-body Injector. Refer to the patient Instructions for Use for detailed instructions. Use exactly as directed. Tell your healthcare provider immediately if you suspect that the On-body Injector may not have performed as intended or if you suspect the use of the On-body Injector resulted in a missed or partial dose. It is important that you put your used needles and syringes in a special sharps container. Do not put them in a trash can. If you do not have a sharps container, call your pharmacist or healthcare provider to get one. Talk to your pediatrician regarding the use of this medicine in children. While this drug may be prescribed for selected conditions, precautions do apply. Overdosage: If you think you have taken too much of this medicine contact a poison control center or emergency room at once. NOTE: This medicine is only for you. Do not share this medicine with others. What if I miss a dose? It is important not to miss your dose.  Call your doctor or health care professional if you miss your dose. If you miss a dose due to an On-body Injector failure or leakage, a new dose should be administered as soon as possible using a single prefilled syringe for manual use. What may interact with this medicine? Interactions have not been studied. Give your health care provider a list of all the medicines, herbs, non-prescription drugs, or dietary supplements you use. Also tell them if you smoke, drink alcohol, or use illegal drugs. Some items may interact with your medicine. This list may not describe all possible interactions. Give your  health care provider a list of all the medicines, herbs, non-prescription drugs, or dietary supplements you use. Also tell them if you smoke, drink alcohol, or use illegal drugs. Some items may interact with your medicine. What should I watch for while using this medicine? You may need blood work done while you are taking this medicine. If you are going to need a MRI, CT scan, or other procedure, tell your doctor that you are using this medicine (On-Body Injector only). What side effects may I notice from receiving this medicine? Side effects that you should report to your doctor or health care professional as soon as possible:  allergic reactions like skin rash, itching or hives, swelling of the face, lips, or tongue  back pain  dizziness  fever  pain, redness, or irritation at site where injected  pinpoint red spots on the skin  red or dark-brown urine  shortness of breath or breathing problems  stomach or side pain, or pain at the shoulder  swelling  tiredness  trouble passing urine or change in the amount of urine Side effects that usually do not require medical attention (report to your doctor or health care professional if they continue or are bothersome):  bone pain  muscle pain This list may not describe all possible side effects. Call your doctor for medical advice about side effects. You may report side effects to FDA at 1-800-FDA-1088. Where should I keep my medicine? Keep out of the reach of children. If you are using this medicine at home, you will be instructed on how to store it. Throw away any unused medicine after the expiration date on the label. NOTE: This sheet is a summary. It may not cover all possible information. If you have questions about this medicine, talk to your doctor, pharmacist, or health care provider.  2020 Elsevier/[employer] (2017-12-21 16:57:08)

## 2020-09-24 NOTE — Progress Notes (Signed)
[place]   [phone] [phone]   Clinic Follow up Note   Patient Care Team: Patient, [name] as PCP - General (General Practice) Stephanie Finner, RN as Oncology Nurse Navigator Stephanie Feeling, NP as Nurse Practitioner (Nurse Practitioner) Stephanie Merle, MD as Consulting Physician (Oncology) Stephanie Klein, MD as Consulting Physician (General Surgery) 09/25/2020  CHIEF COMPLAINT: Follow up metastatic colon cancer   SUMMARY OF ONCOLOGIC HISTORY: Oncology History Overview Note  Cancer Staging Malignant neoplasm of rectosigmoid junction [facility]) Staging form: Colon and Rectum, AJCC 8th Edition - Pathologic stage from 04/04/2020: pT4a, pN1b, cM1 - Signed by Stephanie Feeling, NP on 05/07/2020    Malignant neoplasm of rectosigmoid junction ([place])  03/12/2020 Initial Diagnosis   Malignant neoplasm of rectosigmoid junction ([place])   03/12/2020 Imaging   CT AP with contrast IMPRESSION: 1. Overall findings are highly concerning for colorectal carcinoma involving the sigmoid colon with an associated perforation and adjacent abscess and phlegmon formation as detailed above. Currently, no collection is amenable to percutaneous drainage given their small size and location. 2. New 2 cm mass in the right hepatic lobe concerning for metastatic disease to the liver until proven otherwise. 3. Enlarged regional lymph nodes as detailed above is concerning for nodal metastatic disease. 4. Large stool burden. 5. Prominent pelvic veins which can be seen in patients with pelvic congestion syndrome.   03/13/2020 Imaging   ABD US IMPRESSION: Approximately 2.1 x 2.4 x 2.0 cm lobular homogeneously echogenic lesion in the right hepatic dome corresponds with the abnormality seen on the prior CT scan. Sonographically, this appearance is highly suggestive of a benign hemangioma.   Recommend MRI of the abdomen with gadolinium contrast which may provide a noninvasive  diagnosis of benign hemangioma.    03/13/2020 Imaging   MR ABD W/WO CONTRAST Hepatobiliary: Diffuse low signal intensity throughout the hepatic parenchyma on T2 weighted images, presumably a consequence of recent Feraheme injection. In segment 7 of the liver (axial image 8 of series 5) there is a 2.5 x 1.9 cm well-defined lesion which is slightly T2 hyperintense. This lesion appears hyperintense on pre gadolinium T1 weighted images (likely a consequence of Feraheme). Interpretation of enhancement within the lesion is compromised by presence of Feraheme. No other hepatic lesions are confidently identified on today's examination. No intra or extrahepatic biliary ductal dilatation. Gallbladder is normal in appearance.   03/31/2020 Imaging   CT AP W contrast IMPRESSION: 1. Previously noted sigmoid colon mass appears increased in size, and again appears to be associated with a focal contained perforation which crosses the midline and has fistulized into the left adnexal region where there is now what appears to be a large left tubo-ovarian abscess, as detailed above. This is also associated with multiple enlarged lymph nodes in the pelvis measuring up to 1.2 cm in short axis and borderline enlarged retroperitoneal lymph nodes, concerning for metastatic disease. In addition, previously suspected metastatic lesion in segment 7 of the liver has enlarged. 2. Small volume of ascites. 3. Additional incidental findings, as above.   04/04/2020 Cancer Staging   Staging form: Colon and Rectum, AJCC 8th Edition - Pathologic stage from 04/04/2020: pT4a, pN1b, cM1 - Signed by Stephanie Feeling, NP on 05/07/2020   04/04/2020 Procedure   Paracentesis, path showed no malignant cells (mixed acute and chronic inflammation present)   04/04/2020 Surgery   Open sigmoid colectomy and end colostomy by Dr. Stark [name]   04/04/2020 Pathology Results   FINAL  MICROSCOPIC DIAGNOSIS: A. COLON, RECTOSIGMOID, RESECTION: -  Invasive moderately differentiated adenocarcinoma, 6 cm, involving rectosigmoid junction - Carcinoma invades into serosal surface with perforation and associated serositis - Radial resection margin is positive for carcinoma; proximal and distal margins are not involved - Lymphovascular invasion is present - Metastatic carcinoma to one of fifteen lymph nodes (1/15); one tumor deposit - See oncology table B. LYMPH NODES, MESENTERIC, RESECTION: - Metastatic adenocarcinoma to one of six lymph nodes (1/6) - One tumor deposit  Addendum to note 2 involved lymph nodes (of 21 examined nodes) pT4a,pN1b MMR-normal, preserved expression of MLH1, MSH2, MSH6, PMS2   04/12/2020 Procedure   PAC placement    04/13/2020 Imaging   CT chest without contrast IMPRESSION: Interval development of bilateral pleural effusions, left slightly greater than right, with resultant bibasilar atelectasis including subtotal collapse of the left lower lobe. No evidence of intrathoracic metastatic disease, though evaluation of the collapsed parenchyma is limited. Hepatic metastasis again demonstrated.     04/16/2020 Pathology Results   FINAL MICROSCOPIC DIAGNOSIS:  A. LIVER, RIGHT LOBE, BIOPSY:  - Adenocarcinoma.  COMMENT:  The morphology is compatible with the provided clinical history of colorectal carcinoma.    05/23/2020 -  Chemotherapy   FOLFIRINOX q2weeks for 3-6 months starting 05/23/20. Bevacizumab-bvzr Stephanie [name]) added with C2.    08/05/2020 Imaging   CT AP  IMPRESSION: 1. Postsurgical changes of distal colectomy with a left lower quadrant end ostomy. No evidence of obstruction or acute complication at this time. Excluded rectal pouch in the deep pelvis without acute complication or worrisome features. 2. Slight interval decrease in size of a hypoattenuating lesion posterior right lobe liver measuring 1.7 x 1.8 x 2 cm. This lesion has previously undergone ultrasound-guided biopsy with pathologic results  demonstrating adenocarcinoma compatible with metastatic disease from patient's resected colorectal carcinoma. 3. Slight prominence of the parametrial vessels bilaterally, nonspecific though can be seen in the setting of pelvic congestion syndrome. 4. Mild splenomegaly.  No focal lesion.       CURRENT THERAPY: FOLFOXIRI q2weeks for 3-6 months starting 05/23/20.Bevacizumab-bvzr (Zirabev)added with C2.  INTERVAL HISTORY: Stephanie [name] returns for follow up and treatment as scheduled. She received C9 dose-reduced FOLFOXIRI/beva on 09/12/20.  She feels like she "bounced back sooner," fatigue and cold sensitivity did not last as long. No residual neuropathy in the absence of cold exposure.  Eating well, she drank more green tea and less water last week. Denies mucositis. Takes miralax for mild intermittent constipation. Denies abdominal/RUQ pain. Denies fever, chills, cough, chest pain, dyspnea, or new concerns.    MEDICAL HISTORY:  Past Medical History:  Diagnosis Date  . BV (bacterial vaginosis)   . Cancer ([place])   . Lactose intolerance 03/12/2020  . UTI (lower urinary tract infection)     SURGICAL HISTORY: Past Surgical History:  Procedure Laterality Date  . CESAREAN SECTION    . CYSTOSCOPY WITH STENT PLACEMENT  04/04/2020   Procedure: CYSTOSCOPY WITH STENT PLACEMENT;  Surgeon: Stephanie Klein, MD;  Location: [facility];  Service: General;;  . LAPAROTOMY N/A 04/04/2020   Procedure: EXPLORATORY LAPAROTOMY;  Surgeon: Stephanie Klein, MD;  Location: [facility];  Service: General;  Laterality: N/A;  . PORTACATH PLACEMENT Right 04/12/2020   Procedure: INSERTION PORT-A-CATH WITH ULTRASOUND;  Surgeon: Kieth Brightly Arta Bruce, MD;  Location: [facility];  Service: General;  Laterality: Right;    I have reviewed the social history and family history with the patient and they are unchanged from previous note.  ALLERGIES:  has No  Known Allergies.  MEDICATIONS:  Current Outpatient Medications  Medication Sig  Dispense Refill  . acetaminophen (TYLENOL) 325 MG tablet Take 650 mg by mouth daily as needed for fever or headache (pain).    [name] diclofenac Sodium (VOLTAREN) 1 % GEL Apply 2 g topically 4 (four) times daily. (Patient not taking: Reported on 08/05/2020) 50 g 0  . dicyclomine (BENTYL) 10 MG capsule Take 1 capsule (10 mg total) by mouth 3 (three) times daily before meals. (Patient taking differently: Take 10 mg by mouth 2 (two) times daily as needed (stomach cramps). ) 60 capsule 1  . famotidine (PEPCID) 20 MG tablet Take 1 tablet (20 mg total) by mouth 2 (two) times daily. (Patient not taking: Reported on 08/05/2020) 60 tablet 0  . ferrous sulfate 325 (65 FE) MG tablet Take 1 tablet (325 mg total) by mouth 2 (two) times daily with a meal. (Patient not taking: Reported on 08/05/2020) 60 tablet 0  . lidocaine-prilocaine (EMLA) cream Apply to affected area once (Patient taking differently: Apply 1 application topically as needed (prior to port access). ) 30 g 3  . LORazepam (ATIVAN) 0.5 MG tablet Take 1 tablet (0.5 mg total) by mouth 2 (two) times daily as needed for anxiety. 30 tablet 0  . ondansetron (ZOFRAN) 8 MG tablet Take 1 tablet (8 mg total) by mouth 2 (two) times daily as needed. Start on day 3 after chemotherapy. (Patient taking differently: Take 8 mg by mouth 2 (two) times daily as needed for nausea or vomiting. Start on day 3 after chemotherapy.) 30 tablet 5  . pantoprazole (PROTONIX) 40 MG tablet Take 1 tablet (40 mg total) by mouth 2 (two) times daily. 60 tablet 5  . PARAGARD INTRAUTERINE COPPER IU 1 Device by Intrauterine route once.     . polyethylene glycol (MIRALAX / GLYCOLAX) 17 g packet Take 17 g by mouth 2 (two) times daily. (Patient taking differently: Take 17 g by mouth 2 (two) times daily as needed (constipation). ) 14 each 0  . potassium chloride SA (KLOR-CON) 20 MEQ tablet Take 1 tablet (20 mEq total) by mouth daily. (Patient not taking: Reported on 08/05/2020) 30 tablet 1  .  prochlorperazine (COMPAZINE) 10 MG tablet Take 1 tablet (10 mg total) by mouth every 6 (six) hours as needed (Nausea or vomiting). 30 tablet 5  . senna-docusate (SENOKOT-S) 8.6-50 MG tablet Take 2 tablets by mouth 2 (two) times daily. (Patient taking differently: Take 2 tablets by mouth 2 (two) times daily as needed (constipation). ) 120 tablet 0  . traMADol (ULTRAM) 50 MG tablet Take 1 tablet (50 mg total) by mouth every 12 (twelve) hours as needed for severe pain. 30 tablet 0  . valACYclovir (VALTREX) 500 MG tablet Take 1 tablet (500 mg total) by mouth 2 (two) times daily. 30 tablet 0   No current facility-administered medications for this visit.   Facility-Administered Medications Ordered in Other Visits  Medication Dose Route Frequency Provider Last Rate Last Admin  . 0.9 %  sodium chloride infusion   Intravenous Continuous Stephanie Feeling, NP   Stopped at 09/25/20 [phone]  . fluorouracil (ADRUCIL) 4,500 mg in sodium chloride 0.9 % 60 mL chemo infusion  2,400 mg/m2 (Treatment Plan Recorded) Intravenous 1 day or 1 dose Stephanie Merle, MD   4,500 mg at 09/25/20 1323    PHYSICAL EXAMINATION: ECOG PERFORMANCE STATUS: 1 - Symptomatic but completely ambulatory  Vitals:   09/25/20 0802  BP: 115/71  Pulse: 80  Resp: 18  Temp: 98.1 F (36.7 C)  SpO2: 99%   Filed Weights   09/25/20 0802  Weight: 146 lb 9.6 oz (66.5 kg)    GENERAL:alert, no distress and comfortable SKIN: no rash  EYES:  sclera clear OROPHARYNX: No thrush or ulcers LUNGS:  normal breathing effort HEART:  no lower extremity edema ABDOMEN:abdomen soft, non-tender and normal bowel sounds. Left abdomen colostomy. No hepatomegaly, palpable mass, or RUQ tenderness NEURO: alert & oriented x 3 with fluent speech, no focal motor deficits PAC without erythema    LABORATORY DATA:  I have reviewed the data as listed CBC Latest Ref Rng & Units 09/25/2020 09/12/2020 08/29/2020  WBC 4.0 - 10.5 K/uL 7.6 6.7 7.4  Hemoglobin 12.0 - 15.0  g/dL 13.4 13.6 13.7  Hematocrit 36.0 - 46.0 % 40.1 41.1 41.1  Platelets 150 - 400 K/uL 94(L) 93(L) 117(L)     CMP Latest Ref Rng & Units 09/25/2020 09/12/2020 08/29/2020  Glucose 70 - 99 mg/dL 94 99 86  BUN 6 - 20 mg/dL $Remove'8 8 8  'soAmAJG$ Creatinine 0.44 - 1.00 mg/dL 0.67 0.83 0.67  Sodium 135 - 145 mmol/L 140 141 141  Potassium 3.5 - 5.1 mmol/L 4.0 3.8 4.2  Chloride 98 - 111 mmol/L 108 108 107  CO2 22 - 32 mmol/L $RemoveB'24 24 23  'dCMqnTMP$ Calcium 8.9 - 10.3 mg/dL 9.3 9.4 9.6  Total Protein 6.5 - 8.1 g/dL 7.2 7.3 7.2  Total Bilirubin 0.3 - 1.2 mg/dL 0.3 0.4 0.3  Alkaline Phos 38 - 126 U/L 114 111 101  AST 15 - 41 U/L 55(H) 24 16  ALT 0 - 44 U/L 111(H) 35 21      RADIOGRAPHIC STUDIES: I have personally reviewed the radiological images as listed and agreed with the findings in the report. No results found.   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 Barry Dienes on 7/7/21she was found to have perforation and positive radial margin.Liver biopsy on 04/16/2020 confirmed metastatic disease from her colon cancer -she began first line FOLFOXIRI q2 weeks for3-6 months in 04/2020. -Foundation[facility]newhich shows K-ras mutation(+), she is not a candidate for EGFR inhibitor.Avastin was added with cycle 2 -S/p cycle 6 she went to ED for fever, CTAP showed good partial response to chemo, liver lesion had decreased in no new or progressive disease. -she was seen by Dr. Barry Dienes who is planning liver resection if she continues to have good response on restaging after C12 chemo. To be followed by subsequent colostomy reversal in the future, will need pre-op colonoscopy. Will hold beva before surgery -S/p cycle 9 FOLFOXIRI and bevacizumab,  tolerating moderately well with fatigue, mild diarrhea, and early neuropathy. Oxali and 5FU were dose reduced from cycle 9 for neuropathy and thrombocytopenia   2.Mild nausea, diarrhea/constipation, acid reflux secondary to chemo -After cycle 2 -Added dicyclomine and Protonix, GERD improved -Managed with supportive care at home  3. Intrabdominal abscess secondary to sigmoid colon perforation -Culture showed staph epidermidis, treated with cefepime and fagyl  4.Anemia, secondary to #1 and iron deficiency -She had mild intermittent blood in her stool for the past several months, attributed to hemorrhoids -S/p IV Feraheme in 02/2020 during hospitalization, Ferritin improved to 124 -Not currently on oral iron,anemia resolved after surgery. She received second dose Feraheme on 05/11/20 -OK to resume daily women's  viamin with Iron -Resolved  5. Social/financial  -she has been approved for disability, and applied for SS, medicaid, and [employer] grant   Disposition:  Stephanie [name] appears stable. She completed cycle 9 dose-reduced FOLFOXIRI/beva. She tolerated better than previous cycles, with less fatigue, cold sensitivity, and constipation. Side effects are well managed with supportive care at home. She is able to recover and function well.   She saw Dr. Barry Dienes 09/24/20 who plans to proceed with partial hepatectomy if restaging scan after cycle 12 continues to show good response and no new metastasis. She recommends liver MRI prior to surgery. We will hold beva before surgery. She plans to do colostomy reversal later on and will need pre-op colonoscopy.   Labs reviewed, thrombocytopenia is stable. AST 55 and ALT 111, possibly related to oxali vs mild dehydration? I encouraged her to drink more water, less tea. Will monitor. Iron, CEA, and urine studies remain normal.   She will proceed with C10 FOLFOXIRI and bevacizumab at current dose reductions, and IVF with each pump d/c. She will return  for follow up and cycle 11 in 2 weeks.     Orders Placed This Encounter  Procedures  . MR LIVER W WO CONTRAST    Standing Status:   Future    Standing Expiration Date:   09/25/2021    Order Specific Question:   If indicated for the ordered procedure, I authorize the administration of contrast media per Radiology protocol    Answer:   Yes    Order Specific Question:   What is the patient's sedation requirement?    Answer:   No Sedation    Order Specific Question:   Does the patient have a pacemaker or implanted devices?    Answer:   No    Order Specific Question:   Preferred imaging location?    Answer:   [facility] (table limit - 550 lbs)  . Pregnancy, urine    Standing Status:   Standing    Number of Occurrences:   20    Standing Expiration Date:   09/25/2021   All questions were answered. The patient knows to call the clinic with any problems, questions or concerns. No barriers to learning were detected.      Stephanie Feeling, NP 09/25/20

## 2020-09-25 ENCOUNTER — Inpatient Hospital Stay (HOSPITAL_BASED_OUTPATIENT_CLINIC_OR_DEPARTMENT_OTHER): Payer: BC Managed Care – PPO | Admitting: Nurse Practitioner

## 2020-09-25 ENCOUNTER — Inpatient Hospital Stay: Payer: BC Managed Care – PPO

## 2020-09-25 ENCOUNTER — Encounter: Payer: Self-pay | Admitting: Nurse Practitioner

## 2020-09-25 ENCOUNTER — Other Ambulatory Visit: Payer: Self-pay

## 2020-09-25 VITALS — BP 115/71 | HR 80 | Temp 98.1°F | Resp 18 | Ht 69.0 in | Wt 146.6 lb

## 2020-09-25 DIAGNOSIS — C19 Malignant neoplasm of rectosigmoid junction: Secondary | ICD-10-CM

## 2020-09-25 DIAGNOSIS — Z95828 Presence of other vascular implants and grafts: Secondary | ICD-10-CM

## 2020-09-25 LAB — CBC WITH DIFFERENTIAL (CANCER CENTER ONLY)
Abs Immature Granulocytes: 0.04 10*3/uL (ref 0.00–0.07)
Basophils Absolute: 0 10*3/uL (ref 0.0–0.1)
Basophils Relative: 0 %
Eosinophils Absolute: 0.1 10*3/uL (ref 0.0–0.5)
Eosinophils Relative: 1 %
HCT: 40.1 % (ref 36.0–46.0)
Hemoglobin: 13.4 g/dL (ref 12.0–15.0)
Immature Granulocytes: 1 %
Lymphocytes Relative: 15 %
Lymphs Abs: 1.2 10*3/uL (ref 0.7–4.0)
MCH: 31.9 pg (ref 26.0–34.0)
MCHC: 33.4 g/dL (ref 30.0–36.0)
MCV: 95.5 fL (ref 80.0–100.0)
Monocytes Absolute: 0.7 10*3/uL (ref 0.1–1.0)
Monocytes Relative: 10 %
Neutro Abs: 5.5 10*3/uL (ref 1.7–7.7)
Neutrophils Relative %: 73 %
Platelet Count: 94 10*3/uL — ABNORMAL LOW (ref 150–400)
RBC: 4.2 MIL/uL (ref 3.87–5.11)
RDW: 15.6 % — ABNORMAL HIGH (ref 11.5–15.5)
WBC Count: 7.6 10*3/uL (ref 4.0–10.5)
nRBC: 0 % (ref 0.0–0.2)

## 2020-09-25 LAB — CEA (IN HOUSE-CHCC): CEA (CHCC-In House): 3.75 ng/mL (ref 0.00–5.00)

## 2020-09-25 LAB — PREGNANCY, URINE: Preg Test, Ur: NEGATIVE

## 2020-09-25 LAB — IRON AND TIBC
Iron: 69 ug/dL (ref 41–142)
Saturation Ratios: 22 % (ref 21–57)
TIBC: 316 ug/dL (ref 236–444)
UIBC: 247 ug/dL (ref 120–384)

## 2020-09-25 LAB — COMPREHENSIVE METABOLIC PANEL
ALT: 111 U/L — ABNORMAL HIGH (ref 0–44)
AST: 55 U/L — ABNORMAL HIGH (ref 15–41)
Albumin: 4 g/dL (ref 3.5–5.0)
Alkaline Phosphatase: 114 U/L (ref 38–126)
Anion gap: 8 (ref 5–15)
BUN: 8 mg/dL (ref 6–20)
CO2: 24 mmol/L (ref 22–32)
Calcium: 9.3 mg/dL (ref 8.9–10.3)
Chloride: 108 mmol/L (ref 98–111)
Creatinine, Ser: 0.67 mg/dL (ref 0.44–1.00)
GFR, Estimated: >60 mL/min (ref >60)
Glucose, Bld: 94 mg/dL (ref 70–99)
Potassium: 4 mmol/L (ref 3.5–5.1)
Sodium: 140 mmol/L (ref 135–145)
Total Bilirubin: 0.3 mg/dL (ref 0.3–1.2)
Total Protein: 7.2 g/dL (ref 6.5–8.1)

## 2020-09-25 LAB — TOTAL PROTEIN, URINE DIPSTICK: Protein, ur: NEGATIVE mg/dL

## 2020-09-25 LAB — FERRITIN: Ferritin: 205 ng/mL (ref 11–307)

## 2020-09-25 MED ORDER — LEUCOVORIN CALCIUM INJECTION 350 MG
200.0000 mg/m2 | Freq: Once | INTRAVENOUS | Status: AC
  Administered 2020-09-25: 374 mg via INTRAVENOUS
  Filled 2020-09-25: qty 18.7

## 2020-09-25 MED ORDER — SODIUM CHLORIDE 0.9 % IV SOLN
2400.0000 mg/m2 | INTRAVENOUS | Status: DC
  Administered 2020-09-25: 4500 mg via INTRAVENOUS
  Filled 2020-09-25: qty 90

## 2020-09-25 MED ORDER — SODIUM CHLORIDE 0.9 % IV SOLN
165.0000 mg/m2 | Freq: Once | INTRAVENOUS | Status: AC
  Administered 2020-09-25: 300 mg via INTRAVENOUS
  Filled 2020-09-25: qty 15

## 2020-09-25 MED ORDER — SODIUM CHLORIDE 0.9% FLUSH
10.0000 mL | Freq: Once | INTRAVENOUS | Status: AC
Start: 2020-09-25 — End: 2020-09-25
  Administered 2020-09-25: 10 mL
  Filled 2020-09-25: qty 10

## 2020-09-25 MED ORDER — SODIUM CHLORIDE 0.9 % IV SOLN
10.0000 mg | Freq: Once | INTRAVENOUS | Status: AC
  Administered 2020-09-25: 10 mg via INTRAVENOUS
  Filled 2020-09-25: qty 10

## 2020-09-25 MED ORDER — SODIUM CHLORIDE 0.9 % IV SOLN
INTRAVENOUS | Status: DC
  Filled 2020-09-25: qty 250

## 2020-09-25 MED ORDER — PALONOSETRON HCL INJECTION 0.25 MG/5ML
0.2500 mg | Freq: Once | INTRAVENOUS | Status: AC
  Administered 2020-09-25: 0.25 mg via INTRAVENOUS

## 2020-09-25 MED ORDER — SODIUM CHLORIDE 0.9 % IV SOLN
4.5000 mg/kg | Freq: Once | INTRAVENOUS | Status: AC
  Administered 2020-09-25: 300 mg via INTRAVENOUS
  Filled 2020-09-25: qty 12

## 2020-09-25 MED ORDER — DEXTROSE 5 % IV SOLN
Freq: Once | INTRAVENOUS | Status: AC
  Filled 2020-09-25: qty 250

## 2020-09-25 MED ORDER — SODIUM CHLORIDE 0.9 % IV SOLN
150.0000 mg | Freq: Once | INTRAVENOUS | Status: AC
  Administered 2020-09-25: 150 mg via INTRAVENOUS
  Filled 2020-09-25: qty 150

## 2020-09-25 MED ORDER — ATROPINE SULFATE 1 MG/ML IJ SOLN
INTRAMUSCULAR | Status: AC
  Filled 2020-09-25: qty 1

## 2020-09-25 MED ORDER — ATROPINE SULFATE 1 MG/ML IJ SOLN
0.5000 mg | Freq: Once | INTRAMUSCULAR | Status: AC | PRN
  Administered 2020-09-25: 0.5 mg via INTRAVENOUS

## 2020-09-25 MED ORDER — OXALIPLATIN CHEMO INJECTION 100 MG/20ML
60.0000 mg/m2 | Freq: Once | INTRAVENOUS | Status: AC
  Administered 2020-09-25: 110 mg via INTRAVENOUS
  Filled 2020-09-25: qty 20

## 2020-09-25 MED ORDER — PALONOSETRON HCL INJECTION 0.25 MG/5ML
INTRAVENOUS | Status: AC
  Filled 2020-09-25: qty 5

## 2020-09-25 NOTE — Progress Notes (Signed)
Per Santiago Glad, NP ok to treat with ALT 111 and platelets 94.

## 2020-09-26 ENCOUNTER — Telehealth: Payer: Self-pay | Admitting: Hematology

## 2020-09-26 NOTE — Telephone Encounter (Signed)
Called pt per 12/28 sch msg - pt is aware of appt added for fluids.

## 2020-09-27 ENCOUNTER — Inpatient Hospital Stay: Payer: BC Managed Care – PPO

## 2020-09-27 ENCOUNTER — Other Ambulatory Visit: Payer: Self-pay

## 2020-09-27 VITALS — BP 102/71 | HR 91 | Temp 98.5°F | Resp 18

## 2020-09-27 DIAGNOSIS — C19 Malignant neoplasm of rectosigmoid junction: Secondary | ICD-10-CM | POA: Diagnosis not present

## 2020-09-27 MED ORDER — PEGFILGRASTIM-CBQV 6 MG/0.6ML ~~LOC~~ SOSY
PREFILLED_SYRINGE | SUBCUTANEOUS | Status: AC
  Filled 2020-09-27: qty 0.6

## 2020-09-27 MED ORDER — HEPARIN SOD (PORK) LOCK FLUSH 100 UNIT/ML IV SOLN
500.0000 [IU] | Freq: Once | INTRAVENOUS | Status: AC | PRN
  Administered 2020-09-27: 500 [IU]
  Filled 2020-09-27: qty 5

## 2020-09-27 MED ORDER — PEGFILGRASTIM-CBQV 6 MG/0.6ML ~~LOC~~ SOSY
6.0000 mg | PREFILLED_SYRINGE | Freq: Once | SUBCUTANEOUS | Status: AC
  Administered 2020-09-27: 6 mg via SUBCUTANEOUS

## 2020-09-27 MED ORDER — SODIUM CHLORIDE 0.9 % IV SOLN
Freq: Once | INTRAVENOUS | Status: AC
  Filled 2020-09-27: qty 250

## 2020-09-27 MED ORDER — SODIUM CHLORIDE 0.9% FLUSH
10.0000 mL | INTRAVENOUS | Status: DC | PRN
  Administered 2020-09-27: 10 mL
  Filled 2020-09-27: qty 10

## 2020-09-29 DIAGNOSIS — Z5189 Encounter for other specified aftercare: Secondary | ICD-10-CM

## 2020-09-29 HISTORY — DX: Encounter for other specified aftercare: Z51.89

## 2020-10-01 ENCOUNTER — Telehealth: Payer: Self-pay

## 2020-10-01 ENCOUNTER — Other Ambulatory Visit: Payer: Self-pay | Admitting: Hematology

## 2020-10-01 MED ORDER — METRONIDAZOLE 500 MG PO TABS: 500.0000 mg | ORAL_TABLET | Freq: Two times a day (BID) | ORAL | 0 refills | Status: DC

## 2020-10-01 NOTE — Telephone Encounter (Signed)
Stephanie [name] left vm stating she has "bv again" she is experiencing vaginal itching with yellow green foul smelling drainage.  She is requesting an "antibiotic".

## 2020-10-08 NOTE — Progress Notes (Signed)
[place]   [phone]) [phone] [phone]) [phone]   Clinic Follow up Note   Patient Care Team: Patient, [name] as PCP - General (General Practice) [name], RN as Oncology Nurse Navigator [name], NP as Nurse Practitioner (Nurse Practitioner) [name], MD as Consulting Physician (Oncology) [name], MD as Consulting Physician (General Surgery)  Date of Service:  10/10/2020  CHIEF COMPLAINT: f/u of metastatic colon cancer  SUMMARY OF ONCOLOGIC HISTORY: Oncology History Overview Note  Cancer Staging Malignant neoplasm of rectosigmoid junction [facility]) Staging form: Colon and Rectum, AJCC 8th Edition - Pathologic stage from 04/04/2020: pT4a, pN1b, cM1 - Signed by [name], NP on 05/07/2020    Malignant neoplasm of rectosigmoid junction ([place])  03/12/2020 Initial Diagnosis   Malignant neoplasm of rectosigmoid junction ([place])   03/12/2020 Imaging   CT AP with contrast IMPRESSION: 1. Overall findings are highly concerning for colorectal carcinoma involving the sigmoid colon with an associated perforation and adjacent abscess and phlegmon formation as detailed above. Currently, no collection is amenable to percutaneous drainage given their small size and location. 2. New 2 cm mass in the right hepatic lobe concerning for metastatic disease to the liver until proven otherwise. 3. Enlarged regional lymph nodes as detailed above is concerning for nodal metastatic disease. 4. Large stool burden. 5. Prominent pelvic veins which can be seen in patients with pelvic congestion syndrome.   03/13/2020 Imaging   ABD US IMPRESSION: Approximately 2.1 x 2.4 x 2.0 cm lobular homogeneously echogenic lesion in the right hepatic dome corresponds with the abnormality seen on the prior CT scan. Sonographically, this appearance is highly suggestive of a benign hemangioma.   Recommend MRI of the abdomen with gadolinium contrast which may provide a  noninvasive diagnosis of benign hemangioma.    03/13/2020 Imaging   MR ABD W/WO CONTRAST Hepatobiliary: Diffuse low signal intensity throughout the hepatic parenchyma on T2 weighted images, presumably a consequence of recent Feraheme injection. In segment 7 of the liver (axial image 8 of series 5) there is a 2.5 x 1.9 cm well-defined lesion which is slightly T2 hyperintense. This lesion appears hyperintense on pre gadolinium T1 weighted images (likely a consequence of Feraheme). Interpretation of enhancement within the lesion is compromised by presence of Feraheme. No other hepatic lesions are confidently identified on today's examination. No intra or extrahepatic biliary ductal dilatation. Gallbladder is normal in appearance.   03/31/2020 Imaging   CT AP W contrast IMPRESSION: 1. Previously noted sigmoid colon mass appears increased in size, and again appears to be associated with a focal contained perforation which crosses the midline and has fistulized into the left adnexal region where there is now what appears to be a large left tubo-ovarian abscess, as detailed above. This is also associated with multiple enlarged lymph nodes in the pelvis measuring up to 1.2 cm in short axis and borderline enlarged retroperitoneal lymph nodes, concerning for metastatic disease. In addition, previously suspected metastatic lesion in segment 7 of the liver has enlarged. 2. Small volume of ascites. 3. Additional incidental findings, as above.   04/04/2020 Cancer Staging   Staging form: Colon and Rectum, AJCC 8th Edition - Pathologic stage from 04/04/2020: pT4a, pN1b, cM1 - Signed by [name], NP on 05/07/2020   04/04/2020 Procedure   Paracentesis, path showed no malignant cells (mixed acute and chronic inflammation present)   04/04/2020 Surgery   Open sigmoid colectomy and end colostomy by Dr. [name]   04/04/2020 Pathology  Results   FINAL MICROSCOPIC DIAGNOSIS: A. COLON, RECTOSIGMOID,  RESECTION: - Invasive moderately differentiated adenocarcinoma, 6 cm, involving rectosigmoid junction - Carcinoma invades into serosal surface with perforation and associated serositis - Radial resection margin is positive for carcinoma; proximal and distal margins are not involved - Lymphovascular invasion is present - Metastatic carcinoma to one of fifteen lymph nodes (1/15); one tumor deposit - See oncology table B. LYMPH NODES, MESENTERIC, RESECTION: - Metastatic adenocarcinoma to one of six lymph nodes (1/6) - One tumor deposit  Addendum to note 2 involved lymph nodes (of 21 examined nodes) pT4a,pN1b MMR-normal, preserved expression of MLH1, MSH2, MSH6, PMS2   04/12/2020 Procedure   PAC placement    04/13/2020 Imaging   CT chest without contrast IMPRESSION: Interval development of bilateral pleural effusions, left slightly greater than right, with resultant bibasilar atelectasis including subtotal collapse of the left lower lobe. No evidence of intrathoracic metastatic disease, though evaluation of the collapsed parenchyma is limited. Hepatic metastasis again demonstrated.     04/16/2020 Pathology Results   FINAL MICROSCOPIC DIAGNOSIS:  A. LIVER, RIGHT LOBE, BIOPSY:  - Adenocarcinoma.  COMMENT:  The morphology is compatible with the provided clinical history of colorectal carcinoma.    05/23/2020 -  Chemotherapy   FOLFIRINOX q2weeks for 3-6 months starting 05/23/20. Bevacizumab-bvzr [name] added with C2.    08/05/2020 Imaging   CT AP  IMPRESSION: 1. Postsurgical changes of distal colectomy with a left lower quadrant end ostomy. No evidence of obstruction or acute complication at this time. Excluded rectal pouch in the deep pelvis without acute complication or worrisome features. 2. Slight interval decrease in size of a hypoattenuating lesion posterior right lobe liver measuring 1.7 x 1.8 x 2 cm. This lesion has previously undergone ultrasound-guided biopsy with  pathologic results demonstrating adenocarcinoma compatible with metastatic disease from patient's resected colorectal carcinoma. 3. Slight prominence of the parametrial vessels bilaterally, nonspecific though can be seen in the setting of pelvic congestion syndrome. 4. Mild splenomegaly.  No focal lesion.        CURRENT THERAPY:  FOLFIRINOX q2weeks for 3-6 months starting 05/23/20.Bevacizumab-bvzr (Zirabev)added with C2.  INTERVAL HISTORY:  Stephanie [name] is here for a follow up. She presents to the clinic alone. She notes she is doing well. She notes her fingertips are numb. She denies change in dexterity, but this has been present since last cycle chemo. She notes she has met with Dr Barry Dienes and plans to proceed with liver surgery first then ostomy reversal later. She notes pain in her left buttock at times. She is not sure if this is how she is sitting. Pain will much improve when she is up and moving.     REVIEW OF SYSTEMS:   Constitutional: Denies fevers, chills or abnormal weight loss Eyes: Denies blurriness of vision Ears, nose, mouth, throat, and face: Denies mucositis or sore throat Respiratory: Denies cough, dyspnea or wheezes Cardiovascular: Denies palpitation, chest discomfort or lower extremity swelling Gastrointestinal:  Denies nausea, heartburn or change in bowel habits Skin: Denies abnormal skin rashes MSK: (+) Left buttock pain  Lymphatics: Denies new lymphadenopathy or easy bruising Neurological: (+) Numbness in fingertips.  Behavioral/Psych: Mood is stable, no new changes  All other systems were reviewed with the patient and are negative.  MEDICAL HISTORY:  Past Medical History:  Diagnosis Date  . BV (bacterial vaginosis)   . Cancer ([place])   . Lactose intolerance 03/12/2020  . UTI (lower urinary tract infection)     SURGICAL HISTORY: Past  Surgical History:  Procedure Laterality Date  . CESAREAN SECTION    . CYSTOSCOPY WITH STENT PLACEMENT   04/04/2020   Procedure: CYSTOSCOPY WITH STENT PLACEMENT;  Surgeon: [name], MD;  Location: [facility];  Service: General;;  . LAPAROTOMY N/A 04/04/2020   Procedure: EXPLORATORY LAPAROTOMY;  Surgeon: [name], MD;  Location: [facility];  Service: General;  Laterality: N/A;  . PORTACATH PLACEMENT Right 04/12/2020   Procedure: INSERTION PORT-A-CATH WITH ULTRASOUND;  Surgeon: Kieth Brightly Arta Bruce, MD;  Location: [facility];  Service: General;  Laterality: Right;    I have reviewed the social history and family history with the patient and they are unchanged from previous note.  ALLERGIES:  is allergic to oxaliplatin.  MEDICATIONS:  Current Outpatient Medications  Medication Sig Dispense Refill  . acetaminophen (TYLENOL) 325 MG tablet Take 650 mg by mouth daily as needed for fever or headache (pain).    [name] diclofenac Sodium (VOLTAREN) 1 % GEL Apply 2 g topically 4 (four) times daily. (Patient not taking: Reported on 08/05/2020) 50 g 0  . dicyclomine (BENTYL) 10 MG capsule Take 1 capsule (10 mg total) by mouth 3 (three) times daily before meals. (Patient taking differently: Take 10 mg by mouth 2 (two) times daily as needed (stomach cramps). ) 60 capsule 1  . famotidine (PEPCID) 20 MG tablet Take 1 tablet (20 mg total) by mouth 2 (two) times daily. (Patient not taking: Reported on 08/05/2020) 60 tablet 0  . ferrous sulfate 325 (65 FE) MG tablet Take 1 tablet (325 mg total) by mouth 2 (two) times daily with a meal. (Patient not taking: Reported on 08/05/2020) 60 tablet 0  . lidocaine-prilocaine (EMLA) cream Apply to affected area once (Patient taking differently: Apply 1 application topically as needed (prior to port access). ) 30 g 3  . LORazepam (ATIVAN) 0.5 MG tablet Take 1 tablet (0.5 mg total) by mouth 2 (two) times daily as needed for anxiety. 30 tablet 0  . metroNIDAZOLE (FLAGYL) 500 MG tablet Take 1 tablet (500 mg total) by mouth 2 (two) times daily. 14 tablet 0  . ondansetron (ZOFRAN) 8 MG tablet Take  1 tablet (8 mg total) by mouth 2 (two) times daily as needed. Start on day 3 after chemotherapy. (Patient taking differently: Take 8 mg by mouth 2 (two) times daily as needed for nausea or vomiting. Start on day 3 after chemotherapy.) 30 tablet 5  . pantoprazole (PROTONIX) 40 MG tablet Take 1 tablet (40 mg total) by mouth 2 (two) times daily. 60 tablet 5  . PARAGARD INTRAUTERINE COPPER IU 1 Device by Intrauterine route once.     . polyethylene glycol (MIRALAX / GLYCOLAX) 17 g packet Take 17 g by mouth 2 (two) times daily. (Patient taking differently: Take 17 g by mouth 2 (two) times daily as needed (constipation). ) 14 each 0  . potassium chloride SA (KLOR-CON) 20 MEQ tablet Take 1 tablet (20 mEq total) by mouth daily. (Patient not taking: Reported on 08/05/2020) 30 tablet 1  . prochlorperazine (COMPAZINE) 10 MG tablet Take 1 tablet (10 mg total) by mouth every 6 (six) hours as needed (Nausea or vomiting). 30 tablet 5  . senna-docusate (SENOKOT-S) 8.6-50 MG tablet Take 2 tablets by mouth 2 (two) times daily. (Patient taking differently: Take 2 tablets by mouth 2 (two) times daily as needed (constipation). ) 120 tablet 0  . traMADol (ULTRAM) 50 MG tablet Take 1 tablet (50 mg total) by mouth every 12 (twelve) hours as needed for severe  pain. 30 tablet 0  . valACYclovir (VALTREX) 500 MG tablet Take 1 tablet (500 mg total) by mouth 2 (two) times daily. 30 tablet 0   No current facility-administered medications for this visit.   Facility-Administered Medications Ordered in Other Visits  Medication Dose Route Frequency Provider Last Rate Last Admin  . 0.9 %  sodium chloride infusion   Intravenous Continuous [name], MD   Stopped at 10/10/20 1250  . atropine injection 0.5 mg  0.5 mg Intravenous Once [name], MD      . fluorouracil (ADRUCIL) 4,500 mg in sodium chloride 0.9 % 60 mL chemo infusion  2,400 mg/m2 (Treatment Plan Recorded) Intravenous 1 day or 1 dose [name], MD   4,500 mg at 10/10/20 1517     PHYSICAL EXAMINATION: ECOG PERFORMANCE STATUS: 1 - Symptomatic but completely ambulatory  Vitals:   10/10/20 1003  BP: 116/84  Pulse: 78  Resp: 16  Temp: (!) 96.7 F (35.9 C)  SpO2: 100%   Filed Weights   10/10/20 1003  Weight: 149 lb 12.8 oz (67.9 kg)    GENERAL:alert, no distress and comfortable SKIN: skin color, texture, turgor are normal, no rashes or significant lesions EYES: normal, Conjunctiva are pink and non-injected, sclera clear  NECK: supple, thyroid normal size, non-tender, without nodularity LYMPH:  no palpable lymphadenopathy in the cervical, axillary  LUNGS: clear to auscultation and percussion with normal breathing effort HEART: regular rate & rhythm and no murmurs and no lower extremity edema ABDOMEN:abdomen soft, non-tender and normal bowel sounds Musculoskeletal:no cyanosis of digits and no clubbing  NEURO: alert & oriented x 3 with fluent speech (+) mild to moderate sensory deficits in hands  LABORATORY DATA:  I have reviewed the data as listed CBC Latest Ref Rng & Units 10/10/2020 09/25/2020 09/12/2020  WBC 4.0 - 10.5 K/uL 8.9 7.6 6.7  Hemoglobin 12.0 - 15.0 g/dL 15.0 13.4 13.6  Hematocrit 36.0 - 46.0 % 46.4(H) 40.1 41.1  Platelets 150 - 400 K/uL 93(L) 94(L) 93(L)     CMP Latest Ref Rng & Units 10/10/2020 09/25/2020 09/12/2020  Glucose 70 - 99 mg/dL 75 94 99  BUN 6 - 20 mg/dL 5(L) 8 8  Creatinine 0.44 - 1.00 mg/dL 0.73 0.67 0.83  Sodium 135 - 145 mmol/L 143 140 141  Potassium 3.5 - 5.1 mmol/L 4.3 4.0 3.8  Chloride 98 - 111 mmol/L 108 108 108  CO2 22 - 32 mmol/L _0 Calcium 8.9 - 10.3 mg/dL 9.8 9.3 9.4  Total Protein 6.5 - 8.1 g/dL 7.7 7.2 7.3  Total Bilirubin 0.3 - 1.2 mg/dL 0.3 0.3 0.4  Alkaline Phos 38 - 126 U/L 115 114 111  AST 15 - 41 U/L 52(H) 55(H) 24  ALT 0 - 44 U/L 65(H) 111(H) 35      RADIOGRAPHIC STUDIES: I have personally reviewed the radiological images as listed and agreed with the findings in the report. No  results found.   ASSESSMENT & PLAN:  Stephanie [name] is a 40 y.o. 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 Barry Dienes on 04/04/20. She was found to have perforation and positive radial margin.Liver biopsy on 04/16/2020 confirmed metastatic disease from her colon cancer -Work up isconsistent with stage IV colon cancer s/p surgical resection of the primary tumor with oligo liver metastasis, overall the disease  burden appears to be low.  -Istarted her onfirst line chemo withFOLFOXIRI q2 weeks for3-56month beginning 05/23/20. Bevacizumab-bvzr (Zirabev)added with C2. -She plans to have liver resection with Dr BBarry Dienesafter chemo. She will proceed with ostomy reversal separate and later.  -S/p C10 treatment she has tolerated well but has prolong numbness in her fingertips. She has decreased sensory function on exam today. Will reduce dose with C11 and possibly hold with C12.  -Labs reviewed, Plt 93K, AST 52, ALT 65. Overall adequate to proceed with C11 FOLFIRINOX today with oxaliplatin dose reduction due to mild neuropathy.  -she has seen Dr. BBarry Dienes plan to have liver resection after she completes chemo, and colostomy taken-down in a later time  -Plan for PET scan 2-3 weeks before surgery. After surgery, if she has complete resection, will proceed with surveillance including GuardantReveal Testing. -F/u in 2 weeks for last cycle chemo    2. Mild nausea, diarrhea/constipation, Acid Reflux, secondary to chemo  -She can use Zofran or compazine as needed.  -She can use Miralax for constipation and imodium for diarrhea as needed.She has magnesium citrate which she can use if Miralax is not enough. -For Acid Reflux, will continue Protonix.  -If she develops any caner related  pain, she still has Tramadol.  -Stablewith medication management.  3.Anemia, secondary to #1 and iron deficiencyfrom GI Blood loss. Resolved s/p IVFeraheme in 6/2021and8/13/21.  4. Financial assistance  -She notes she stopped working from home given her troubles to work with cold sensitivity. -She has been approved for disability, and applied for SS, medicaid, and cone grant   Plan: -Labs reviewed and adequate to proceed with C11 FOLFOXIRI today with oxaliplatin dose reduction to 459mm2 due to neuropathy -hold bevacizumab today and next cycle  -Lab, flush, f/u and FOLFIRINOXin 2 weeks.  -will order restaging PET on next visit   Addendum -pt developed SOB, chest palpitation and abdominal discomfort shortly after oxaliplatin started.  I saw her in infusion.  Her vital sign was stable, exam showed no wheezing or lip/tongue swollen, mild skin erythema around her nose and mouth.  I stopped oxaliplatin, and give her Solu-Medrol 60 mg, Benadryl and Pepcid.  Her symptoms resolved.  Do not plan to recharllenge with oxaliplatin in near future.   No problem-specific Assessment & Plan notes found for this encounter.   No orders of the defined types were placed in this encounter.  All questions were answered. The patient knows to call the clinic with any problems, questions or concerns. No barriers to learning was detected. The total time spent in the appointment was 40 minutes.     YaTruitt MerleMD 10/10/2020   I, AmJoslyn Devonam acting as scribe for YaTruitt MerleMD.   I have reviewed the above documentation for accuracy and completeness, and I agree with the above.

## 2020-10-10 ENCOUNTER — Inpatient Hospital Stay: Payer: BC Managed Care – PPO

## 2020-10-10 ENCOUNTER — Other Ambulatory Visit: Payer: Self-pay

## 2020-10-10 ENCOUNTER — Encounter: Payer: Self-pay | Admitting: Hematology

## 2020-10-10 ENCOUNTER — Inpatient Hospital Stay: Payer: BC Managed Care – PPO | Attending: Nurse Practitioner | Admitting: Hematology

## 2020-10-10 VITALS — BP 116/84 | HR 78 | Temp 96.7°F | Resp 16 | Ht 69.0 in | Wt 149.8 lb

## 2020-10-10 VITALS — BP 142/94 | HR 85 | Resp 16

## 2020-10-10 DIAGNOSIS — C19 Malignant neoplasm of rectosigmoid junction: Secondary | ICD-10-CM

## 2020-10-10 DIAGNOSIS — K219 Gastro-esophageal reflux disease without esophagitis: Secondary | ICD-10-CM | POA: Diagnosis not present

## 2020-10-10 DIAGNOSIS — G620 Drug-induced polyneuropathy: Secondary | ICD-10-CM | POA: Diagnosis not present

## 2020-10-10 DIAGNOSIS — Z5189 Encounter for other specified aftercare: Secondary | ICD-10-CM | POA: Diagnosis not present

## 2020-10-10 DIAGNOSIS — D630 Anemia in neoplastic disease: Secondary | ICD-10-CM | POA: Insufficient documentation

## 2020-10-10 DIAGNOSIS — Z95828 Presence of other vascular implants and grafts: Secondary | ICD-10-CM

## 2020-10-10 DIAGNOSIS — T451X5A Adverse effect of antineoplastic and immunosuppressive drugs, initial encounter: Secondary | ICD-10-CM | POA: Insufficient documentation

## 2020-10-10 DIAGNOSIS — Z933 Colostomy status: Secondary | ICD-10-CM | POA: Diagnosis not present

## 2020-10-10 DIAGNOSIS — R110 Nausea: Secondary | ICD-10-CM

## 2020-10-10 DIAGNOSIS — C787 Secondary malignant neoplasm of liver and intrahepatic bile duct: Secondary | ICD-10-CM | POA: Diagnosis present

## 2020-10-10 DIAGNOSIS — Z79899 Other long term (current) drug therapy: Secondary | ICD-10-CM | POA: Diagnosis not present

## 2020-10-10 DIAGNOSIS — Z5111 Encounter for antineoplastic chemotherapy: Secondary | ICD-10-CM | POA: Insufficient documentation

## 2020-10-10 LAB — CMP (CANCER CENTER ONLY)
ALT: 65 U/L — ABNORMAL HIGH (ref 0–44)
AST: 52 U/L — ABNORMAL HIGH (ref 15–41)
Albumin: 4.3 g/dL (ref 3.5–5.0)
Alkaline Phosphatase: 115 U/L (ref 38–126)
Anion gap: 13 (ref 5–15)
BUN: 5 mg/dL — ABNORMAL LOW (ref 6–20)
CO2: 22 mmol/L (ref 22–32)
Calcium: 9.8 mg/dL (ref 8.9–10.3)
Chloride: 108 mmol/L (ref 98–111)
Creatinine: 0.73 mg/dL (ref 0.44–1.00)
GFR, Estimated: >60 mL/min (ref >60)
Glucose, Bld: 75 mg/dL (ref 70–99)
Potassium: 4.3 mmol/L (ref 3.5–5.1)
Sodium: 143 mmol/L (ref 135–145)
Total Bilirubin: 0.3 mg/dL (ref 0.3–1.2)
Total Protein: 7.7 g/dL (ref 6.5–8.1)

## 2020-10-10 LAB — CBC WITH DIFFERENTIAL (CANCER CENTER ONLY)
Abs Immature Granulocytes: 0.04 10*3/uL (ref 0.00–0.07)
Basophils Absolute: 0 10*3/uL (ref 0.0–0.1)
Basophils Relative: 0 %
Eosinophils Absolute: 0.1 10*3/uL (ref 0.0–0.5)
Eosinophils Relative: 1 %
HCT: 46.4 % — ABNORMAL HIGH (ref 36.0–46.0)
Hemoglobin: 15 g/dL (ref 12.0–15.0)
Immature Granulocytes: 0 %
Lymphocytes Relative: 14 %
Lymphs Abs: 1.3 10*3/uL (ref 0.7–4.0)
MCH: 32.3 pg (ref 26.0–34.0)
MCHC: 32.3 g/dL (ref 30.0–36.0)
MCV: 99.8 fL (ref 80.0–100.0)
Monocytes Absolute: 1 10*3/uL (ref 0.1–1.0)
Monocytes Relative: 11 %
Neutro Abs: 6.6 10*3/uL (ref 1.7–7.7)
Neutrophils Relative %: 74 %
Platelet Count: 93 10*3/uL — ABNORMAL LOW (ref 150–400)
RBC: 4.65 MIL/uL (ref 3.87–5.11)
RDW: 15.4 % (ref 11.5–15.5)
WBC Count: 8.9 10*3/uL (ref 4.0–10.5)
nRBC: 0 % (ref 0.0–0.2)

## 2020-10-10 LAB — TOTAL PROTEIN, URINE DIPSTICK: Protein, ur: NEGATIVE mg/dL

## 2020-10-10 LAB — PREGNANCY, URINE: Preg Test, Ur: NEGATIVE

## 2020-10-10 MED ORDER — PROCHLORPERAZINE EDISYLATE 10 MG/2ML IJ SOLN
INTRAMUSCULAR | Status: AC
  Filled 2020-10-10: qty 2

## 2020-10-10 MED ORDER — ATROPINE SULFATE 1 MG/ML IJ SOLN
0.5000 mg | Freq: Once | INTRAMUSCULAR | Status: AC | PRN
  Administered 2020-10-10: 0.5 mg via INTRAVENOUS

## 2020-10-10 MED ORDER — SODIUM CHLORIDE 0.9 % IV SOLN
2400.0000 mg/m2 | INTRAVENOUS | Status: DC
  Administered 2020-10-10: 4500 mg via INTRAVENOUS
  Filled 2020-10-10: qty 90

## 2020-10-10 MED ORDER — PALONOSETRON HCL INJECTION 0.25 MG/5ML
0.2500 mg | Freq: Once | INTRAVENOUS | Status: AC
  Administered 2020-10-10: 0.25 mg via INTRAVENOUS

## 2020-10-10 MED ORDER — SODIUM CHLORIDE 0.9 % IV SOLN
150.0000 mg | Freq: Once | INTRAVENOUS | Status: AC
  Administered 2020-10-10: 150 mg via INTRAVENOUS
  Filled 2020-10-10: qty 150

## 2020-10-10 MED ORDER — ATROPINE SULFATE 1 MG/ML IJ SOLN: 0.5000 mg | Freq: Once | INTRAMUSCULAR | Status: DC

## 2020-10-10 MED ORDER — FAMOTIDINE IN NACL 20-0.9 MG/50ML-% IV SOLN
20.0000 mg | Freq: Once | INTRAVENOUS | Status: AC | PRN
  Administered 2020-10-10: 20 mg via INTRAVENOUS

## 2020-10-10 MED ORDER — ALTEPLASE 2 MG IJ SOLR
INTRAMUSCULAR | Status: AC
  Filled 2020-10-10: qty 2

## 2020-10-10 MED ORDER — LEUCOVORIN CALCIUM INJECTION 350 MG
200.0000 mg/m2 | Freq: Once | INTRAVENOUS | Status: AC
  Administered 2020-10-10: 374 mg via INTRAVENOUS
  Filled 2020-10-10: qty 18.7

## 2020-10-10 MED ORDER — DIPHENHYDRAMINE HCL 50 MG/ML IJ SOLN
50.0000 mg | Freq: Once | INTRAMUSCULAR | Status: AC | PRN
  Administered 2020-10-10: 50 mg via INTRAVENOUS

## 2020-10-10 MED ORDER — SODIUM CHLORIDE 0.9 % IV SOLN
INTRAVENOUS | Status: DC
  Filled 2020-10-10: qty 250

## 2020-10-10 MED ORDER — DEXTROSE 5 % IV SOLN
Freq: Once | INTRAVENOUS | Status: AC
Start: 2020-10-10 — End: 2020-10-10
  Filled 2020-10-10: qty 250

## 2020-10-10 MED ORDER — SODIUM CHLORIDE 0.9 % IV SOLN
10.0000 mg | Freq: Once | INTRAVENOUS | Status: AC
  Administered 2020-10-10: 10 mg via INTRAVENOUS
  Filled 2020-10-10: qty 10

## 2020-10-10 MED ORDER — OXALIPLATIN CHEMO INJECTION 100 MG/20ML
40.0000 mg/m2 | Freq: Once | INTRAVENOUS | Status: AC
  Administered 2020-10-10: 75 mg via INTRAVENOUS
  Filled 2020-10-10: qty 15

## 2020-10-10 MED ORDER — SODIUM CHLORIDE 0.9% FLUSH
10.0000 mL | Freq: Once | INTRAVENOUS | Status: AC
  Administered 2020-10-10: 10 mL
  Filled 2020-10-10: qty 10

## 2020-10-10 MED ORDER — ALTEPLASE 2 MG IJ SOLR
2.0000 mg | Freq: Once | INTRAMUSCULAR | Status: AC
  Administered 2020-10-10: 2 mg
  Filled 2020-10-10: qty 2

## 2020-10-10 MED ORDER — PALONOSETRON HCL INJECTION 0.25 MG/5ML
INTRAVENOUS | Status: AC
  Filled 2020-10-10: qty 5

## 2020-10-10 MED ORDER — ATROPINE SULFATE 1 MG/ML IJ SOLN
INTRAMUSCULAR | Status: AC
  Filled 2020-10-10: qty 1

## 2020-10-10 MED ORDER — METHYLPREDNISOLONE SODIUM SUCC 125 MG IJ SOLR
125.0000 mg | Freq: Once | INTRAMUSCULAR | Status: AC | PRN
  Administered 2020-10-10: 125 mg via INTRAVENOUS

## 2020-10-10 MED ORDER — PROCHLORPERAZINE EDISYLATE 10 MG/2ML IJ SOLN
10.0000 mg | Freq: Once | INTRAMUSCULAR | Status: AC
  Administered 2020-10-10: 10 mg via INTRAVENOUS

## 2020-10-10 MED ORDER — SODIUM CHLORIDE 0.9 % IV SOLN
165.0000 mg/m2 | Freq: Once | INTRAVENOUS | Status: AC
  Administered 2020-10-10: 300 mg via INTRAVENOUS
  Filled 2020-10-10: qty 15

## 2020-10-10 NOTE — Progress Notes (Signed)
Shortly after initiating oxaliplatin/LCV, patient began sneezing and c/o abdominal discomfort/rhinorrhea. Infusion paused. Discussed with Dr. Burr Medico. She advised to repeat atropine dose. When preparing to administer atropine, patient began to c/o increased sense of anxiety, chest pressure and dyspnea. Noted facial erythema. Dr. Burr Medico evaluated patient in the infusion room. Hypersensitivity protocol initiated. Medicated as documented in [facility]. Dr. Burr Medico advised to hold remaining oxaliplatin due to hypersensitivity reaction. Patient verbalized rapid improvement in condition with near immediate return to baseline with the exception of persistent nausea. Dr. Burr Medico provided additional orders, and patient was medicated as documented in [facility]. Again, patient verbalized rapid improvement in symptoms. Leucovorin resumed per Dr. Ernestina Penna request. Infusion completed without further incident. At discharge, patient verbalized feeling "back to normal" with the exception of feeling drowsy. Patient had someone to drive her home.

## 2020-10-10 NOTE — Patient Instructions (Signed)
[facility] Discharge Instructions for Patients Receiving Chemotherapy  Today you received the following chemotherapy agents: irinotecan, oxaliplatin, leucovorin, and fluorouracil.  To help prevent nausea and vomiting after your treatment, we encourage you to take your nausea medication as directed.   If you develop nausea and vomiting that is not controlled by your nausea medication, call the clinic.   BELOW ARE SYMPTOMS THAT SHOULD BE REPORTED IMMEDIATELY:  *FEVER GREATER THAN 100.5 F  *CHILLS WITH OR WITHOUT FEVER  NAUSEA AND VOMITING THAT IS NOT CONTROLLED WITH YOUR NAUSEA MEDICATION  *UNUSUAL SHORTNESS OF BREATH  *UNUSUAL BRUISING OR BLEEDING  TENDERNESS IN MOUTH AND THROAT WITH OR WITHOUT PRESENCE OF ULCERS  *URINARY PROBLEMS  *BOWEL PROBLEMS  UNUSUAL RASH Items with * indicate a potential emergency and should be followed up as soon as possible.  Feel free to call the clinic should you have any questions or concerns. The clinic phone number is [phone].  Please show the [facility] at check-in to the Emergency Department and triage nurse.

## 2020-10-10 NOTE — Progress Notes (Signed)
Per Dr. Burr Medico, ok to treat with low platelets 93.

## 2020-10-10 NOTE — Progress Notes (Signed)
Unable to get blood return from port. Cathflo administered by Denny Peon RN, patient sent back to lab to get blood drawn from arm

## 2020-10-11 ENCOUNTER — Telehealth: Payer: Self-pay | Admitting: Hematology

## 2020-10-11 NOTE — Telephone Encounter (Signed)
No 11/2 los. No changes made to pt's schedule.  

## 2020-10-12 ENCOUNTER — Inpatient Hospital Stay: Payer: BC Managed Care – PPO

## 2020-10-12 ENCOUNTER — Other Ambulatory Visit: Payer: Self-pay

## 2020-10-12 VITALS — BP 106/72 | HR 67 | Temp 98.3°F | Resp 18

## 2020-10-12 DIAGNOSIS — C19 Malignant neoplasm of rectosigmoid junction: Secondary | ICD-10-CM

## 2020-10-12 DIAGNOSIS — Z95828 Presence of other vascular implants and grafts: Secondary | ICD-10-CM

## 2020-10-12 MED ORDER — PEGFILGRASTIM-CBQV 6 MG/0.6ML ~~LOC~~ SOSY
PREFILLED_SYRINGE | SUBCUTANEOUS | Status: AC
  Filled 2020-10-12: qty 0.6

## 2020-10-12 MED ORDER — SODIUM CHLORIDE 0.9% FLUSH
10.0000 mL | INTRAVENOUS | Status: DC | PRN
  Administered 2020-10-12: 10 mL
  Filled 2020-10-12: qty 10

## 2020-10-12 MED ORDER — SODIUM CHLORIDE 0.9 % IV SOLN
Freq: Once | INTRAVENOUS | Status: AC
  Filled 2020-10-12: qty 250

## 2020-10-12 MED ORDER — HEPARIN SOD (PORK) LOCK FLUSH 100 UNIT/ML IV SOLN
500.0000 [IU] | Freq: Once | INTRAVENOUS | Status: AC | PRN
  Administered 2020-10-12: 500 [IU]
  Filled 2020-10-12: qty 5

## 2020-10-12 MED ORDER — ALTEPLASE 2 MG IJ SOLR
2.0000 mg | Freq: Once | INTRAMUSCULAR | Status: DC | PRN
  Filled 2020-10-12: qty 2

## 2020-10-12 MED ORDER — PEGFILGRASTIM-CBQV 6 MG/0.6ML ~~LOC~~ SOSY
6.0000 mg | PREFILLED_SYRINGE | Freq: Once | SUBCUTANEOUS | Status: AC
  Administered 2020-10-12: 6 mg via SUBCUTANEOUS

## 2020-10-22 NOTE — Progress Notes (Signed)
Stephanie [name]   Telephone:([phone]) [phone] Fax:(336) [phone]   Clinic Follow up Note   Patient Care Team: Patient, [name] as PCP - General (General Practice) [name], RN as Oncology Nurse Navigator [name], NP as Nurse Practitioner (Nurse Practitioner) [name], MD as Consulting Physician (Oncology) [name], MD as Consulting Physician (General Surgery)  Date of Service:  10/24/2020  CHIEF COMPLAINT: f/u of metastatic colon cancer  SUMMARY OF ONCOLOGIC HISTORY: Oncology History Overview Note  Cancer Staging Malignant neoplasm of rectosigmoid junction [facility]) Staging form: Colon and Rectum, AJCC 8th Edition - Pathologic stage from 04/04/2020: pT4a, pN1b, cM1 - Signed by [name], NP on 05/07/2020    Malignant neoplasm of rectosigmoid junction ([place])  03/12/2020 Initial Diagnosis   Malignant neoplasm of rectosigmoid junction ([place])   03/12/2020 Imaging   CT AP with contrast IMPRESSION: 1. Overall findings are highly concerning for colorectal carcinoma involving the sigmoid colon with an associated perforation and adjacent abscess and phlegmon formation as detailed above. Currently, no collection is amenable to percutaneous drainage given their small size and location. 2. New 2 cm mass in the right hepatic lobe concerning for metastatic disease to the liver until proven otherwise. 3. Enlarged regional lymph nodes as detailed above is concerning for nodal metastatic disease. 4. Large stool burden. 5. Prominent pelvic veins which can be seen in patients with pelvic congestion syndrome.   03/13/2020 Imaging   ABD US IMPRESSION: Approximately 2.1 x 2.4 x 2.0 cm lobular homogeneously echogenic lesion in the right hepatic dome corresponds with the abnormality seen on the prior CT scan. Sonographically, this appearance is highly suggestive of a benign hemangioma.   Recommend MRI of the abdomen with gadolinium contrast which may provide a  noninvasive diagnosis of benign hemangioma.    03/13/2020 Imaging   MR ABD W/WO CONTRAST Hepatobiliary: Diffuse low signal intensity throughout the hepatic parenchyma on T2 weighted images, presumably a consequence of recent Feraheme injection. In segment 7 of the liver (axial image 8 of series 5) there is a 2.5 x 1.9 cm well-defined lesion which is slightly T2 hyperintense. This lesion appears hyperintense on pre gadolinium T1 weighted images (likely a consequence of Feraheme). Interpretation of enhancement within the lesion is compromised by presence of Feraheme. No other hepatic lesions are confidently identified on today's examination. No intra or extrahepatic biliary ductal dilatation. Gallbladder is normal in appearance.   03/31/2020 Imaging   CT AP W contrast IMPRESSION: 1. Previously noted sigmoid colon mass appears increased in size, and again appears to be associated with a focal contained perforation which crosses the midline and has fistulized into the left adnexal region where there is now what appears to be a large left tubo-ovarian abscess, as detailed above. This is also associated with multiple enlarged lymph nodes in the pelvis measuring up to 1.2 cm in short axis and borderline enlarged retroperitoneal lymph nodes, concerning for metastatic disease. In addition, previously suspected metastatic lesion in segment 7 of the liver has enlarged. 2. Small volume of ascites. 3. Additional incidental findings, as above.   04/04/2020 Cancer Staging   Staging form: Colon and Rectum, AJCC 8th Edition - Pathologic stage from 04/04/2020: pT4a, pN1b, cM1 - Signed by [name], NP on 05/07/2020   04/04/2020 Procedure   Paracentesis, path showed no malignant cells (mixed acute and chronic inflammation present)   04/04/2020 Surgery   Open sigmoid colectomy and end colostomy by Dr. [name]   04/04/2020 Pathology  Results   FINAL MICROSCOPIC DIAGNOSIS: A. COLON, RECTOSIGMOID,  RESECTION: - Invasive moderately differentiated adenocarcinoma, 6 cm, involving rectosigmoid junction - Carcinoma invades into serosal surface with perforation and associated serositis - Radial resection margin is positive for carcinoma; proximal and distal margins are not involved - Lymphovascular invasion is present - Metastatic carcinoma to one of fifteen lymph nodes (1/15); one tumor deposit - See oncology table B. LYMPH NODES, MESENTERIC, RESECTION: - Metastatic adenocarcinoma to one of six lymph nodes (1/6) - One tumor deposit  Addendum to note 2 involved lymph nodes (of 21 examined nodes) pT4a,pN1b MMR-normal, preserved expression of MLH1, MSH2, MSH6, PMS2   04/12/2020 Procedure   PAC placement    04/13/2020 Imaging   CT chest without contrast IMPRESSION: Interval development of bilateral pleural effusions, left slightly greater than right, with resultant bibasilar atelectasis including subtotal collapse of the left lower lobe. No evidence of intrathoracic metastatic disease, though evaluation of the collapsed parenchyma is limited. Hepatic metastasis again demonstrated.     04/16/2020 Pathology Results   FINAL MICROSCOPIC DIAGNOSIS:  A. LIVER, RIGHT LOBE, BIOPSY:  - Adenocarcinoma.  COMMENT:  The morphology is compatible with the provided clinical history of colorectal carcinoma.    05/23/2020 - 10/24/2020 Chemotherapy   FOLFIRINOX q2weeks for 3-6 months starting 05/23/20. Bevacizumab-bvzr [name] added with C2. Oxaliplatin held with C11-12 due to reaction. (pt developed SOB, chest palpitation and abdominal discomfort shortly after oxaliplatin started). Completed on 10/24/20.   08/05/2020 Imaging   CT AP  IMPRESSION: 1. Postsurgical changes of distal colectomy with a left lower quadrant end ostomy. No evidence of obstruction or acute complication at this time. Excluded rectal pouch in the deep pelvis without acute complication or worrisome features. 2. Slight interval  decrease in size of a hypoattenuating lesion posterior right lobe liver measuring 1.7 x 1.8 x 2 cm. This lesion has previously undergone ultrasound-guided biopsy with pathologic results demonstrating adenocarcinoma compatible with metastatic disease from patient's resected colorectal carcinoma. 3. Slight prominence of the parametrial vessels bilaterally, nonspecific though can be seen in the setting of pelvic congestion syndrome. 4. Mild splenomegaly.  No focal lesion.        CURRENT THERAPY:  FOLFIRINOX q2weeks for 3-6 months starting 05/23/20.Bevacizumab-bvzr (Zirabev)added with C2.Oxaliplatin held with C11-12 due to reaction. Completed on 10/24/20.  INTERVAL HISTORY:  Stephanie [name] is here for a follow up. She presents to the clinic alone. She denies further rash or SOB after chemo infusion last week. She notes her SOB started with Oxaliplatin which she did not complete. She continues to have lower back pain from sitting mostly. She notes icy hot has helped and so has being more active. She notes she is eating adequately and weight is stable. She notes she takes ativan as needed for her anxiety. She notes she is emotional today about completing chemo.     REVIEW OF SYSTEMS:   Constitutional: Denies fevers, chills or abnormal weight loss Eyes: Denies blurriness of vision Ears, nose, mouth, throat, and face: Denies mucositis or sore throat Respiratory: Denies cough, dyspnea or wheezes Cardiovascular: Denies palpitation, chest discomfort or lower extremity swelling Gastrointestinal:  Denies nausea, heartburn or change in bowel habits Skin: Denies abnormal skin rashes MSK: (+) stable lower back pain.  Lymphatics: Denies new lymphadenopathy or easy bruising Neurological:Denies numbness, tingling or new weaknesses Behavioral/Psych: Mood is stable, no new changes  All other systems were reviewed with the patient and are negative.  MEDICAL HISTORY:  Past Medical History:   Diagnosis Date  .  BV (bacterial vaginosis)   . Cancer ([place])   . Lactose intolerance 03/12/2020  . UTI (lower urinary tract infection)     SURGICAL HISTORY: Past Surgical History:  Procedure Laterality Date  . CESAREAN SECTION    . CYSTOSCOPY WITH STENT PLACEMENT  04/04/2020   Procedure: CYSTOSCOPY WITH STENT PLACEMENT;  Surgeon: [name], MD;  Location: [facility];  Service: General;;  . LAPAROTOMY N/A 04/04/2020   Procedure: EXPLORATORY LAPAROTOMY;  Surgeon: [name], MD;  Location: [facility];  Service: General;  Laterality: N/A;  . PORTACATH PLACEMENT Right 04/12/2020   Procedure: INSERTION PORT-A-CATH WITH ULTRASOUND;  Surgeon: Kieth Brightly Arta Bruce, MD;  Location: [facility];  Service: General;  Laterality: Right;    I have reviewed the social history and family history with the patient and they are unchanged from previous note.  ALLERGIES:  is allergic to oxaliplatin.  MEDICATIONS:  Current Outpatient Medications  Medication Sig Dispense Refill  . acetaminophen (TYLENOL) 325 MG tablet Take 650 mg by mouth daily as needed for fever or headache (pain).    [name] diclofenac Sodium (VOLTAREN) 1 % GEL Apply 2 g topically 4 (four) times daily. (Patient not taking: Reported on 08/05/2020) 50 g 0  . dicyclomine (BENTYL) 10 MG capsule Take 1 capsule (10 mg total) by mouth 3 (three) times daily before meals. (Patient taking differently: Take 10 mg by mouth 2 (two) times daily as needed (stomach cramps). ) 60 capsule 1  . famotidine (PEPCID) 20 MG tablet Take 1 tablet (20 mg total) by mouth 2 (two) times daily. (Patient not taking: Reported on 08/05/2020) 60 tablet 0  . ferrous sulfate 325 (65 FE) MG tablet Take 1 tablet (325 mg total) by mouth 2 (two) times daily with a meal. (Patient not taking: Reported on 08/05/2020) 60 tablet 0  . lidocaine-prilocaine (EMLA) cream Apply to affected area once (Patient taking differently: Apply 1 application topically as needed (prior to port access). ) 30 g 3  .  LORazepam (ATIVAN) 0.5 MG tablet Take 1 tablet (0.5 mg total) by mouth 2 (two) times daily as needed for anxiety. 30 tablet 0  . metroNIDAZOLE (FLAGYL) 500 MG tablet Take 1 tablet (500 mg total) by mouth 2 (two) times daily. 14 tablet 0  . ondansetron (ZOFRAN) 8 MG tablet Take 1 tablet (8 mg total) by mouth 2 (two) times daily as needed. Start on day 3 after chemotherapy. (Patient taking differently: Take 8 mg by mouth 2 (two) times daily as needed for nausea or vomiting. Start on day 3 after chemotherapy.) 30 tablet 5  . pantoprazole (PROTONIX) 40 MG tablet Take 1 tablet (40 mg total) by mouth 2 (two) times daily. 60 tablet 5  . PARAGARD INTRAUTERINE COPPER IU 1 Device by Intrauterine route once.     . polyethylene glycol (MIRALAX / GLYCOLAX) 17 g packet Take 17 g by mouth 2 (two) times daily. (Patient taking differently: Take 17 g by mouth 2 (two) times daily as needed (constipation). ) 14 each 0  . potassium chloride SA (KLOR-CON) 20 MEQ tablet Take 1 tablet (20 mEq total) by mouth daily. (Patient not taking: Reported on 08/05/2020) 30 tablet 1  . prochlorperazine (COMPAZINE) 10 MG tablet Take 1 tablet (10 mg total) by mouth every 6 (six) hours as needed (Nausea or vomiting). 30 tablet 5  . senna-docusate (SENOKOT-S) 8.6-50 MG tablet Take 2 tablets by mouth 2 (two) times daily. (Patient taking differently: Take 2 tablets by mouth 2 (two) times daily as needed (constipation). )  120 tablet 0  . traMADol (ULTRAM) 50 MG tablet Take 1 tablet (50 mg total) by mouth every 12 (twelve) hours as needed for severe pain. 20 tablet 0  . valACYclovir (VALTREX) 500 MG tablet Take 1 tablet (500 mg total) by mouth 2 (two) times daily. 30 tablet 0   No current facility-administered medications for this visit.   Facility-Administered Medications Ordered in Other Visits  Medication Dose Route Frequency Provider Last Rate Last Admin  . dextrose 5 % solution   Intravenous Once [name], MD      . fluorouracil  (ADRUCIL) 4,500 mg in sodium chloride 0.9 % 60 mL chemo infusion  2,400 mg/m2 (Treatment Plan Recorded) Intravenous 1 day or 1 dose [name], MD      . leucovorin 374 mg in dextrose 5 % 250 mL infusion  200 mg/m2 (Treatment Plan Recorded) Intravenous Once [name], MD 179 mL/hr at 10/24/20 1040 374 mg at 10/24/20 1040    PHYSICAL EXAMINATION: ECOG PERFORMANCE STATUS: 1 - Symptomatic but completely ambulatory  Vitals:   10/24/20 0912  BP: 113/78  Pulse: 83  Resp: 15  Temp: 97.9 F (36.6 C)  SpO2: 100%   Filed Weights   10/24/20 0912  Weight: 151 lb 8 oz (68.7 kg)    Due to COVID19 we will limit examination to appearance. Patient had no complaints.  GENERAL:alert, no distress and comfortable SKIN: skin color normal, no rashes or significant lesions EYES: normal, Conjunctiva are pink and non-injected, sclera clear  NEURO: alert & oriented x 3 with fluent speech   LABORATORY DATA:  I have reviewed the data as listed CBC Latest Ref Rng & Units 10/24/2020 10/10/2020 09/25/2020  WBC 4.0 - 10.5 K/uL 7.8 8.9 7.6  Hemoglobin 12.0 - 15.0 g/dL 13.3 15.0 13.4  Hematocrit 36.0 - 46.0 % 40.4 46.4(H) 40.1  Platelets 150 - 400 K/uL 85(L) 93(L) 94(L)     CMP Latest Ref Rng & Units 10/24/2020 10/10/2020 09/25/2020  Glucose 70 - 99 mg/dL 102(H) 75 94  BUN 6 - 20 mg/dL 8 5(L) 8  Creatinine 0.44 - 1.00 mg/dL 0.67 0.73 0.67  Sodium 135 - 145 mmol/L 141 143 140  Potassium 3.5 - 5.1 mmol/L 4.1 4.3 4.0  Chloride 98 - 111 mmol/L 108 108 108  CO2 22 - 32 mmol/L $RemoveB'25 22 24  'EHxzlVUt$ Calcium 8.9 - 10.3 mg/dL 8.9 9.8 9.3  Total Protein 6.5 - 8.1 g/dL 6.7 7.7 7.2  Total Bilirubin 0.3 - 1.2 mg/dL 0.2(L) 0.3 0.3  Alkaline Phos 38 - 126 U/L 124 115 114  AST 15 - 41 U/L 62(H) 52(H) 55(H)  ALT 0 - 44 U/L 127(H) 65(H) 111(H)      RADIOGRAPHIC STUDIES: I have personally reviewed the radiological images as listed and agreed with the findings in the report. No results found.   ASSESSMENT & PLAN:  Stephanie [name] is a 40 y.o. 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 Barry Dienes on 04/04/20. She was found to have perforation and positive radial margin.Liver biopsy on 04/16/2020 confirmed metastatic disease from her colon cancer -Work up isconsistent with stage IV colon cancer s/p surgical resection of the primary tumor with oligo liver metastasis, overall the disease burden appears to be low.  -Istarted her onfirst line chemo withFOLFOXIRI q2 weeks for 45months beginning 05/23/20.  Bevacizumab-bvzr (Zirabev)added with C2. -She plans to have liver resection with Dr Barry Dienes after chemo. She will proceed with ostomy reversal separate and later.  -During C11 infusion pt developed SOB, chest palpitation and abdominal discomfort shortly after oxaliplatin started. We stopped Oxaliplatin but she was able to complete the rest of infusion. Will continue to hold oxaliplatin for C12 today.   -Labs reviewed, plt 85K. Overall adequate to proceed with final cycle with irinotecan/5FU/leucovorin today.  -I discussed scanning her before her liver surgery with Dr Barry Dienes. She will f/u with Dr Barry Dienes soon.  -I discussed that she will not need adjuvant therapy after liver surgery unless she has positive margins on surgical path I may offer adjuvant chemo or Radiation. Otherwise, will proceed with 5 years surveillance plan including GuardantReveal Testing after surgery.  -She plans to have colostomy reversal later in the year.  -Lab and f/u in 3 weeks (after scans).    2. Mild nausea, diarrhea/constipation, Acid Reflux, secondary to chemo  -She can use Zofran or compazine as needed.  -She can use Miralax for constipation and imodium for diarrhea as needed.She has magnesium citrate which she can use if  Miralax is not enough. -For Acid Reflux, will continue Protonix.  -If she develops any caner related pain, she still has Tramadol which she uses as needed.  -Stablewith medication management.  3.Anemia, secondary to #1 and iron deficiencyfrom GI Blood loss. Resolved s/p IVFeraheme in 6/2021and8/13/21.  4. Financial assistance  -She notes she stopped working from home given her troubles to work with cold sensitivity. -She has been approved for disability, and applied for SS, medicaid, and [employer] grant   Plan: -Labs reviewed and adequate to proceed with final cycle irinotecan/5FU/leucovorin today with slightly dose reduction of irinotecan  -lab and MRI Abdomen and pelvis and CT Chest in 2-3 weeks.  -Copy note to Dr Barry Dienes about proceeding with liver surgery. She will see her after scan to schedule her surgery  -F/u in 3 weeks    No problem-specific Assessment & Plan notes found for this encounter.   Orders Placed This Encounter  Procedures  . MR Abdomen W Wo Contrast    Standing Status:   Future    Standing Expiration Date:   10/24/2021    Order Specific Question:   If indicated for the ordered procedure, I authorize the administration of contrast media per Radiology protocol    Answer:   Yes    Order Specific Question:   What is the patient's sedation requirement?    Answer:   No Sedation    Order Specific Question:   Does the patient have a pacemaker or implanted devices?    Answer:   No    Order Specific Question:   Preferred imaging location?    Answer:   [facility] (table limit - 550 lbs)  . MR Pelvis W Wo Contrast    Standing Status:   Future    Standing Expiration Date:   10/24/2021    Order Specific Question:   If indicated for the ordered procedure, I authorize the administration of contrast media per Radiology protocol    Answer:   Yes    Order Specific Question:   What is the patient's sedation requirement?    Answer:   No Sedation    Order  Specific Question:   Does the patient have a pacemaker or implanted devices?    Answer:   No    Order Specific Question:   Preferred imaging location?  Answer:   [facility] (table limit - 550 lbs)  . CT Chest Wo Contrast    Rule out metastasis from colon cancer    Standing Status:   Future    Standing Expiration Date:   10/24/2021    Order Specific Question:   Is patient pregnant?    Answer:   No    Order Specific Question:   Preferred imaging location?    Answer:   [facility]   All questions were answered. The patient knows to call the clinic with any problems, questions or concerns. No barriers to learning was detected. The total time spent in the appointment was 30 minutes.     [name], MD 10/24/2020   I, Joslyn Devon, am acting as scribe for [name], MD.   I have reviewed the above documentation for accuracy and completeness, and I agree with the above.

## 2020-10-24 ENCOUNTER — Other Ambulatory Visit: Payer: Self-pay

## 2020-10-24 ENCOUNTER — Encounter: Payer: Self-pay | Admitting: Hematology

## 2020-10-24 ENCOUNTER — Inpatient Hospital Stay: Payer: BC Managed Care – PPO

## 2020-10-24 ENCOUNTER — Inpatient Hospital Stay (HOSPITAL_BASED_OUTPATIENT_CLINIC_OR_DEPARTMENT_OTHER): Payer: BC Managed Care – PPO | Admitting: Hematology

## 2020-10-24 VITALS — BP 113/78 | HR 83 | Temp 97.9°F | Resp 15 | Ht 69.0 in | Wt 151.5 lb

## 2020-10-24 DIAGNOSIS — C19 Malignant neoplasm of rectosigmoid junction: Secondary | ICD-10-CM

## 2020-10-24 DIAGNOSIS — K219 Gastro-esophageal reflux disease without esophagitis: Secondary | ICD-10-CM | POA: Diagnosis not present

## 2020-10-24 LAB — CBC WITH DIFFERENTIAL (CANCER CENTER ONLY)
Abs Immature Granulocytes: 0.03 10*3/uL (ref 0.00–0.07)
Basophils Absolute: 0 10*3/uL (ref 0.0–0.1)
Basophils Relative: 1 %
Eosinophils Absolute: 0.1 10*3/uL (ref 0.0–0.5)
Eosinophils Relative: 1 %
HCT: 40.4 % (ref 36.0–46.0)
Hemoglobin: 13.3 g/dL (ref 12.0–15.0)
Immature Granulocytes: 0 %
Lymphocytes Relative: 12 %
Lymphs Abs: 0.9 10*3/uL (ref 0.7–4.0)
MCH: 32.8 pg (ref 26.0–34.0)
MCHC: 32.9 g/dL (ref 30.0–36.0)
MCV: 99.5 fL (ref 80.0–100.0)
Monocytes Absolute: 0.6 10*3/uL (ref 0.1–1.0)
Monocytes Relative: 7 %
Neutro Abs: 6.1 10*3/uL (ref 1.7–7.7)
Neutrophils Relative %: 79 %
Platelet Count: 85 10*3/uL — ABNORMAL LOW (ref 150–400)
RBC: 4.06 MIL/uL (ref 3.87–5.11)
RDW: 14.6 % (ref 11.5–15.5)
WBC Count: 7.8 10*3/uL (ref 4.0–10.5)
nRBC: 0 % (ref 0.0–0.2)

## 2020-10-24 LAB — CMP (CANCER CENTER ONLY)
ALT: 127 U/L — ABNORMAL HIGH (ref 0–44)
AST: 62 U/L — ABNORMAL HIGH (ref 15–41)
Albumin: 3.7 g/dL (ref 3.5–5.0)
Alkaline Phosphatase: 124 U/L (ref 38–126)
Anion gap: 8 (ref 5–15)
BUN: 8 mg/dL (ref 6–20)
CO2: 25 mmol/L (ref 22–32)
Calcium: 8.9 mg/dL (ref 8.9–10.3)
Chloride: 108 mmol/L (ref 98–111)
Creatinine: 0.67 mg/dL (ref 0.44–1.00)
GFR, Estimated: >60 mL/min (ref >60)
Glucose, Bld: 102 mg/dL — ABNORMAL HIGH (ref 70–99)
Potassium: 4.1 mmol/L (ref 3.5–5.1)
Sodium: 141 mmol/L (ref 135–145)
Total Bilirubin: 0.2 mg/dL — ABNORMAL LOW (ref 0.3–1.2)
Total Protein: 6.7 g/dL (ref 6.5–8.1)

## 2020-10-24 LAB — CEA (IN HOUSE-CHCC): CEA (CHCC-In House): 2.81 ng/mL (ref 0.00–5.00)

## 2020-10-24 LAB — IRON AND TIBC
Iron: 92 ug/dL (ref 41–142)
Saturation Ratios: 28 % (ref 21–57)
TIBC: 330 ug/dL (ref 236–444)
UIBC: 238 ug/dL (ref 120–384)

## 2020-10-24 LAB — FERRITIN: Ferritin: 135 ng/mL (ref 11–307)

## 2020-10-24 LAB — TOTAL PROTEIN, URINE DIPSTICK: Protein, ur: NEGATIVE mg/dL

## 2020-10-24 LAB — PREGNANCY, URINE: Preg Test, Ur: NEGATIVE

## 2020-10-24 MED ORDER — SODIUM CHLORIDE 0.9 % IV SOLN
10.0000 mg | Freq: Once | INTRAVENOUS | Status: AC
  Administered 2020-10-24: 10 mg via INTRAVENOUS
  Filled 2020-10-24: qty 10

## 2020-10-24 MED ORDER — PANTOPRAZOLE SODIUM 40 MG PO TBEC: 40.0000 mg | DELAYED_RELEASE_TABLET | Freq: Two times a day (BID) | ORAL | 5 refills | Status: DC

## 2020-10-24 MED ORDER — ATROPINE SULFATE 1 MG/ML IJ SOLN
INTRAMUSCULAR | Status: AC
  Filled 2020-10-24: qty 1

## 2020-10-24 MED ORDER — PALONOSETRON HCL INJECTION 0.25 MG/5ML
0.2500 mg | Freq: Once | INTRAVENOUS | Status: AC
Start: 2020-10-24 — End: 2020-10-24
  Administered 2020-10-24: 0.25 mg via INTRAVENOUS

## 2020-10-24 MED ORDER — TRAMADOL HCL 50 MG PO TABS: 50.0000 mg | ORAL_TABLET | Freq: Two times a day (BID) | ORAL | 0 refills | Status: DC | PRN

## 2020-10-24 MED ORDER — ATROPINE SULFATE 1 MG/ML IJ SOLN
0.5000 mg | Freq: Once | INTRAMUSCULAR | Status: AC | PRN
  Administered 2020-10-24: 0.5 mg via INTRAVENOUS

## 2020-10-24 MED ORDER — LORAZEPAM 0.5 MG PO TABS: 0.5000 mg | ORAL_TABLET | Freq: Two times a day (BID) | ORAL | 0 refills | Status: DC | PRN

## 2020-10-24 MED ORDER — PALONOSETRON HCL INJECTION 0.25 MG/5ML
INTRAVENOUS | Status: AC
  Filled 2020-10-24: qty 5

## 2020-10-24 MED ORDER — SODIUM CHLORIDE 0.9 % IV SOLN
2400.0000 mg/m2 | INTRAVENOUS | Status: DC
  Administered 2020-10-24: 4500 mg via INTRAVENOUS
  Filled 2020-10-24: qty 90

## 2020-10-24 MED ORDER — DEXTROSE 5 % IV SOLN
200.0000 mg/m2 | Freq: Once | INTRAVENOUS | Status: AC
  Administered 2020-10-24: 374 mg via INTRAVENOUS
  Filled 2020-10-24: qty 18.7

## 2020-10-24 MED ORDER — DEXTROSE 5 % IV SOLN
Freq: Once | INTRAVENOUS | Status: DC
  Filled 2020-10-24: qty 250

## 2020-10-24 MED ORDER — SODIUM CHLORIDE 0.9 % IV SOLN
150.0000 mg | Freq: Once | INTRAVENOUS | Status: AC
  Administered 2020-10-24: 150 mg via INTRAVENOUS
  Filled 2020-10-24: qty 150

## 2020-10-24 MED ORDER — SODIUM CHLORIDE 0.9 % IV SOLN
150.0000 mg/m2 | Freq: Once | INTRAVENOUS | Status: AC
  Administered 2020-10-24: 280 mg via INTRAVENOUS
  Filled 2020-10-24: qty 14

## 2020-10-24 NOTE — Patient Instructions (Signed)
[facility] Discharge Instructions for Patients Receiving Chemotherapy  Today you received the following chemotherapy agents  Irinotecan, leukovorin, fluorouracil To help prevent nausea and vomiting after your treatment, we encourage you to take your nausea medication as directed.   If you develop nausea and vomiting that is not controlled by your nausea medication, call the clinic.   BELOW ARE SYMPTOMS THAT SHOULD BE REPORTED IMMEDIATELY:  *FEVER GREATER THAN 100.5 F  *CHILLS WITH OR WITHOUT FEVER  NAUSEA AND VOMITING THAT IS NOT CONTROLLED WITH YOUR NAUSEA MEDICATION  *UNUSUAL SHORTNESS OF BREATH  *UNUSUAL BRUISING OR BLEEDING  TENDERNESS IN MOUTH AND THROAT WITH OR WITHOUT PRESENCE OF ULCERS  *URINARY PROBLEMS  *BOWEL PROBLEMS  UNUSUAL RASH Items with * indicate a potential emergency and should be followed up as soon as possible.  Feel free to call the clinic should you have any questions or concerns. The clinic phone number is [phone].  Please show the [place] at check-in to the Emergency Department and triage nurse.

## 2020-10-24 NOTE — Progress Notes (Signed)
MD states okay to treat with PLT 85 and ALT 127, states will decrease irinotecan dosage.

## 2020-10-25 ENCOUNTER — Telehealth: Payer: Self-pay

## 2020-10-25 ENCOUNTER — Telehealth: Payer: Self-pay | Admitting: Hematology

## 2020-10-25 NOTE — Telephone Encounter (Signed)
Per Dr. Burr Medico informed patient that she has spoken with Dr. Barry Dienes and she wants a liver MRI done so Dr. Burr Medico has ordered to be done within the next two weeks.  Then Dr. Barry Dienes will see her back to discuss her liver surgery.  I told her to expect a phone call from [place] regarding the MRI.  She verbalized an understanding.

## 2020-10-25 NOTE — Telephone Encounter (Signed)
Scheduled appointments per 12/6 los. Spoke to patient who is aware of appointments dates and times.  

## 2020-10-26 ENCOUNTER — Inpatient Hospital Stay: Payer: BC Managed Care – PPO

## 2020-10-26 ENCOUNTER — Other Ambulatory Visit: Payer: Self-pay

## 2020-10-26 VITALS — BP 107/68 | HR 86 | Temp 98.1°F | Resp 17

## 2020-10-26 DIAGNOSIS — Z95828 Presence of other vascular implants and grafts: Secondary | ICD-10-CM

## 2020-10-26 DIAGNOSIS — C19 Malignant neoplasm of rectosigmoid junction: Secondary | ICD-10-CM | POA: Diagnosis not present

## 2020-10-26 MED ORDER — SODIUM CHLORIDE 0.9% FLUSH
10.0000 mL | INTRAVENOUS | Status: DC | PRN
  Administered 2020-10-26: 10 mL
  Filled 2020-10-26: qty 10

## 2020-10-26 MED ORDER — PEGFILGRASTIM-CBQV 6 MG/0.6ML ~~LOC~~ SOSY
6.0000 mg | PREFILLED_SYRINGE | Freq: Once | SUBCUTANEOUS | Status: AC
  Administered 2020-10-26: 6 mg via SUBCUTANEOUS

## 2020-10-26 MED ORDER — PEGFILGRASTIM-CBQV 6 MG/0.6ML ~~LOC~~ SOSY
PREFILLED_SYRINGE | SUBCUTANEOUS | Status: AC
  Filled 2020-10-26: qty 0.6

## 2020-10-26 MED ORDER — HEPARIN SOD (PORK) LOCK FLUSH 100 UNIT/ML IV SOLN
500.0000 [IU] | Freq: Once | INTRAVENOUS | Status: AC | PRN
  Administered 2020-10-26: 500 [IU]
  Filled 2020-10-26: qty 5

## 2020-10-26 MED ORDER — SODIUM CHLORIDE 0.9 % IV SOLN
Freq: Once | INTRAVENOUS | Status: AC
  Filled 2020-10-26: qty 250

## 2020-10-26 NOTE — Patient Instructions (Signed)
Rehydration, Adult Rehydration is the replacement of body fluids, salts, and minerals (electrolytes) that are lost during dehydration. Dehydration is when there is not enough water or other fluids in the body. This happens when you lose more fluids than you take in. Common causes of dehydration include:  Not drinking enough fluids. This can occur when you are ill or doing activities that require a lot of energy, especially in hot weather.  Conditions that cause loss of water or other fluids, such as diarrhea, vomiting, sweating, or urinating a lot.  Other illnesses, such as fever or infection.  Certain medicines, such as those that remove excess fluid from the body (diuretics). Symptoms of mild or moderate dehydration may include thirst, dry lips and mouth, and dizziness. Symptoms of severe dehydration may include increased heart rate, confusion, fainting, and not urinating. For severe dehydration, you may need to get fluids through an IV at the hospital. For mild or moderate dehydration, you can usually rehydrate at home by drinking certain fluids as told by your health care provider. What are the risks? Generally, rehydration is safe. However, taking in too much fluid (overhydration) can be a problem. This is rare. Overhydration can cause an electrolyte imbalance, kidney failure, or a decrease in salt (sodium) levels in the body. Supplies needed You will need an oral rehydration solution (ORS) if your health care provider tells you to use one. This is a drink to treat dehydration. It can be found in pharmacies and retail stores. How to rehydrate Fluids Follow instructions from your health care provider for rehydration. The kind of fluid and the amount you should drink depend on your condition. In general, you should choose drinks that you prefer.  If told by your health care provider, drink an ORS. ? Make an ORS by following instructions on the package. ? Start by drinking small amounts,  about  cup (120 mL) every 5-10 minutes. ? Slowly increase how much you drink until you have taken the amount recommended by your health care provider.  Drink enough clear fluids to keep your urine pale yellow. If you were told to drink an ORS, finish it first, then start slowly drinking other clear fluids. Drink fluids such as: ? Water. This includes sparkling water and flavored water. Drinking only water can lead to having too little sodium in your body (hyponatremia). Follow the advice of your health care provider. ? Water from ice chips you suck on. ? Fruit juice with water you add to it (diluted). ? Sports drinks. ? Hot or cold herbal teas. ? Broth-based soups. ? Milk or milk products. Food Follow instructions from your health care provider about what to eat while you rehydrate. Your health care provider may recommend that you slowly begin eating regular foods in small amounts.  Eat foods that contain a healthy balance of electrolytes, such as bananas, oranges, potatoes, tomatoes, and spinach.  Avoid foods that are greasy or contain a lot of sugar. In some cases, you may get nutrition through a feeding tube that is passed through your nose and into your stomach (nasogastric tube, or NG tube). This may be done if you have uncontrolled vomiting or diarrhea.   Beverages to avoid Certain beverages may make dehydration worse. While you rehydrate, avoid drinking alcohol.   How to tell if you are recovering from dehydration You may be recovering from dehydration if:  You are urinating more often than before you started rehydrating.  Your urine is pale yellow.  Your energy level   improves.  You vomit less frequently.  You have diarrhea less frequently.  Your appetite improves or returns to normal.  You feel less dizzy or less light-headed.  Your skin tone and color start to look more normal. Follow these instructions at home:  Take over-the-counter and prescription medicines only  as told by your health care provider.  Do not take sodium tablets. Doing this can lead to having too much sodium in your body (hypernatremia). Contact a health care provider if:  You continue to have symptoms of mild or moderate dehydration, such as: ? Thirst. ? Dry lips. ? Slightly dry mouth. ? Dizziness. ? Dark urine or less urine than normal. ? Muscle cramps.  You continue to vomit or have diarrhea. Get help right away if you:  Have symptoms of dehydration that get worse.  Have a fever.  Have a severe headache.  Have been vomiting and the following happens: ? Your vomiting gets worse or does not go away. ? Your vomit includes blood or green matter (bile). ? You cannot eat or drink without vomiting.  Have problems with urination or bowel movements, such as: ? Diarrhea that gets worse or does not go away. ? Blood in your stool (feces). This may cause stool to look black and tarry. ? Not urinating, or urinating only a small amount of very dark urine, within 6-8 hours.  Have trouble breathing.  Have symptoms that get worse with treatment. These symptoms may represent a serious problem that is an emergency. Do not wait to see if the symptoms will go away. Get medical help right away. Call your local emergency services (911 in the [place] Do not drive yourself to the hospital. Summary  Rehydration is the replacement of body fluids and minerals (electrolytes) that are lost during dehydration.  Follow instructions from your health care provider for rehydration. The kind of fluid and amount you should drink depend on your condition.  Slowly increase how much you drink until you have taken the amount recommended by your health care provider.  Contact your health care provider if you continue to show signs of mild or moderate dehydration. This information is not intended to replace advice given to you by your health care provider. Make sure you discuss any questions you have with  your health care provider. Document Revised: 11/16/2019 Document Reviewed: 09/26/2019 Elsevier Patient Education  2021 [employer]  Pegfilgrastim injection What is this medicine? PEGFILGRASTIM (PEG fil gra stim) is a long-acting granulocyte colony-stimulating factor that stimulates the growth of neutrophils, a type of white blood cell important in the body's fight against infection. It is used to reduce the incidence of fever and infection in patients with certain types of cancer who are receiving chemotherapy that affects the bone marrow, and to increase survival after being exposed to high doses of radiation. This medicine may be used for other purposes; ask your health care provider or pharmacist if you have questions. COMMON BRAND NAME(S): Fulphila, Neulasta, Nyvepria, UDENYCA, Ziextenzo What should I tell my health care provider before I take this medicine? They need to know if you have any of these conditions:  kidney disease  latex allergy  ongoing radiation therapy  sickle cell disease  skin reactions to acrylic adhesives (On-Body Injector only)  an unusual or allergic reaction to pegfilgrastim, filgrastim, other medicines, foods, dyes, or preservatives  pregnant or trying to get pregnant  breast-feeding How should I use this medicine? This medicine is for injection under the skin. If you   get this medicine at home, you will be taught how to prepare and give the pre-filled syringe or how to use the On-body Injector. Refer to the patient Instructions for Use for detailed instructions. Use exactly as directed. Tell your healthcare provider immediately if you suspect that the On-body Injector may not have performed as intended or if you suspect the use of the On-body Injector resulted in a missed or partial dose. It is important that you put your used needles and syringes in a special sharps container. Do not put them in a trash can. If you do not have a sharps container, call  your pharmacist or healthcare provider to get one. Talk to your pediatrician regarding the use of this medicine in children. While this drug may be prescribed for selected conditions, precautions do apply. Overdosage: If you think you have taken too much of this medicine contact a poison control center or emergency room at once. NOTE: This medicine is only for you. Do not share this medicine with others. What if I miss a dose? It is important not to miss your dose. Call your doctor or health care professional if you miss your dose. If you miss a dose due to an On-body Injector failure or leakage, a new dose should be administered as soon as possible using a single prefilled syringe for manual use. What may interact with this medicine? Interactions have not been studied. This list may not describe all possible interactions. Give your health care provider a list of all the medicines, herbs, non-prescription drugs, or dietary supplements you use. Also tell them if you smoke, drink alcohol, or use illegal drugs. Some items may interact with your medicine. What should I watch for while using this medicine? Your condition will be monitored carefully while you are receiving this medicine. You may need blood work done while you are taking this medicine. Talk to your health care provider about your risk of cancer. You may be more at risk for certain types of cancer if you take this medicine. If you are going to need a MRI, CT scan, or other procedure, tell your doctor that you are using this medicine (On-Body Injector only). What side effects may I notice from receiving this medicine? Side effects that you should report to your doctor or health care professional as soon as possible:  allergic reactions (skin rash, itching or hives, swelling of the face, lips, or tongue)  back pain  dizziness  fever  pain, redness, or irritation at site where injected  pinpoint red spots on the skin  red or  dark-brown urine  shortness of breath or breathing problems  stomach or side pain, or pain at the shoulder  swelling  tiredness  trouble passing urine or change in the amount of urine  unusual bruising or bleeding Side effects that usually do not require medical attention (report to your doctor or health care professional if they continue or are bothersome):  bone pain  muscle pain This list may not describe all possible side effects. Call your doctor for medical advice about side effects. You may report side effects to FDA at 1-800-FDA-1088. Where should I keep my medicine? Keep out of the reach of children. If you are using this medicine at home, you will be instructed on how to store it. Throw away any unused medicine after the expiration date on the label. NOTE: This sheet is a summary. It may not cover all possible information. If you have questions about this medicine,   talk to your doctor, pharmacist, or health care provider.  2021 Elsevier/Gold Standard (2019-10-07 13:20:51)  

## 2020-11-06 ENCOUNTER — Ambulatory Visit (HOSPITAL_COMMUNITY): Payer: BC Managed Care – PPO

## 2020-11-09 ENCOUNTER — Ambulatory Visit (HOSPITAL_COMMUNITY)
Admission: RE | Admit: 2020-11-09 | Discharge: 2020-11-09 | Disposition: A | Discharge status: Home / Self Care | Payer: BC Managed Care – PPO | Source: Ambulatory Visit | Attending: Hematology | Admitting: Hematology

## 2020-11-09 ENCOUNTER — Other Ambulatory Visit: Payer: Self-pay

## 2020-11-09 DIAGNOSIS — C19 Malignant neoplasm of rectosigmoid junction: Secondary | ICD-10-CM

## 2020-11-09 IMAGING — MR MR PELVIS WO/W CM
25 of 26 series · 44 of 48 positions shown · IV contrast (gadavist)
Comparison: CT abdomen pelvis, [DATE]

CLINICAL DATA: Metastatic rectosigmoid colon cancer, liver
oligometastatic disease, planned liver resection

EXAM:
MRI ABDOMEN AND PELVIS WITHOUT AND WITH CONTRAST
TECHNIQUE: Multiplanar multisequence MR imaging of the abdomen and pelvis was
performed both before and after the administration of intravenous
contrast.
CONTRAST:  6mL GADAVIST GADOBUTROL 1 MMOL/ML IV SOLN

[Series 2: T2 · coronal · 6.0mm · 0.78mm/px · 1 of 34 slices shown (1 of 6)]
[im 1/34]
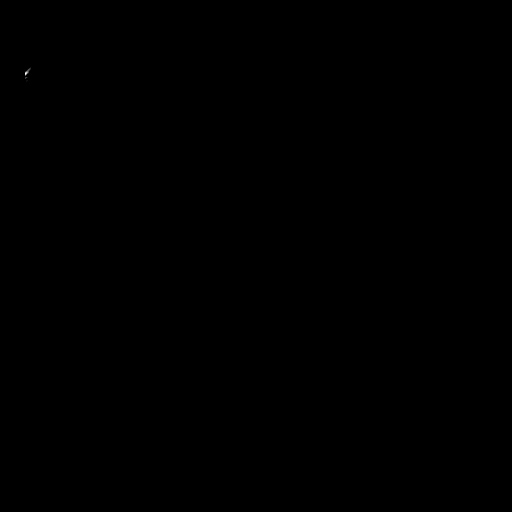

[Series 4: T2 · axial · 5.0mm · 0.51mm/px · 1 of 40 slices shown (2 of 6)]
[im 1/40]
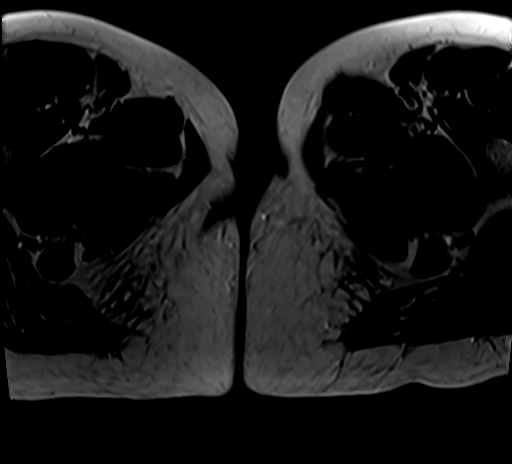

[Series 5: T2 · sagittal · 5.0mm · 0.55mm/px · 1 of 40 slices shown (3 of 6)]
[im 1/40]
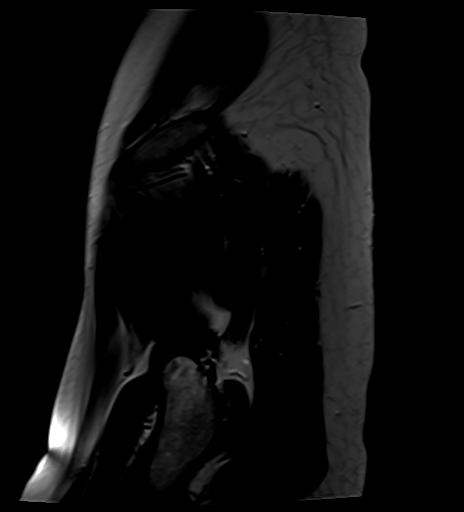

[Series 6: T2 · coronal · 6.0mm · 1.56mm/px · 1 of 30 slices shown (4 of 6)]
[im 1/30]
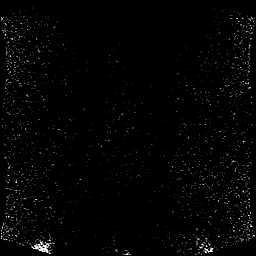

[Series 7: T1 · axial · 5.0mm · 0.51mm/px · 1 of 40 slices shown (1 of 3)]
[im 1/40]
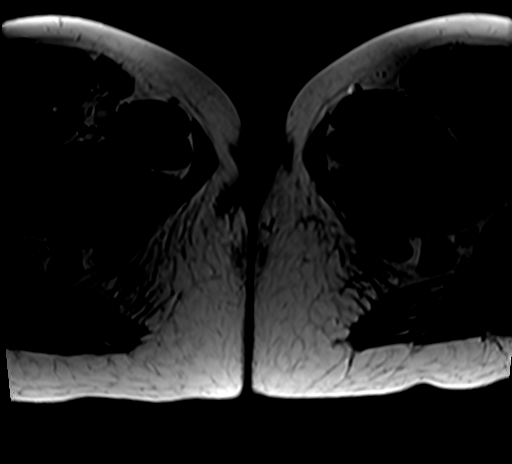

[Series 8: T1 fat-sat · axial · non-contrast · 5.0mm · 0.51mm/px · 1 of 40 slices shown]
[im 1/40]
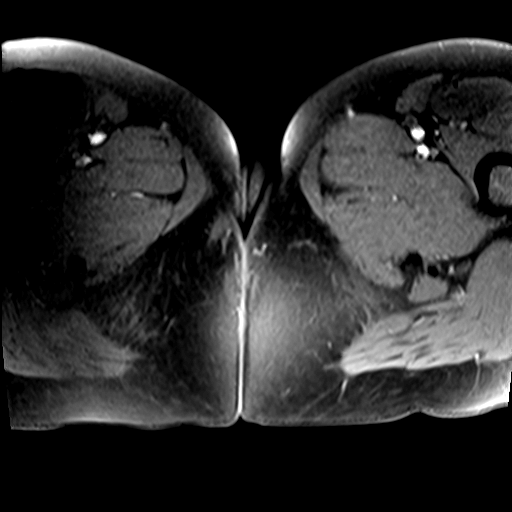

[Series 11: T2 · coronal · 6.0mm · 1.56mm/px · 1 of 27 slices shown (5 of 6)]
[im 1/27]
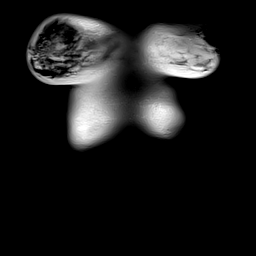

[Series 12: bSSFP · axial · 4.0mm · 0.78mm/px · 1 of 73 slices shown]
[im 1/73]
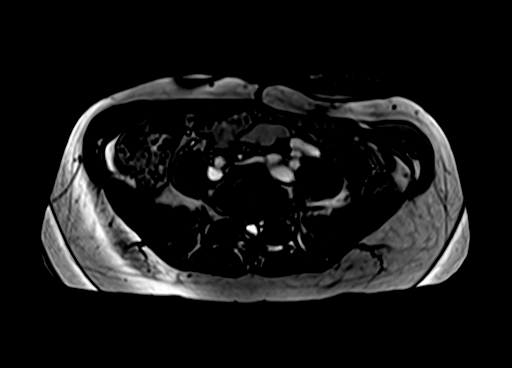

[Series 13: T1 · axial · 3.0mm · 1.25mm/px · z∈[+16,+325]mm · 2 of 104 slices shown (2 of 3)]
[im 1/104]
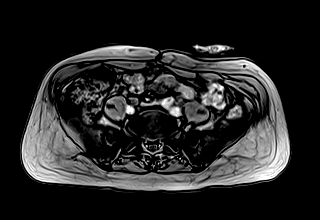
[im 104/104]
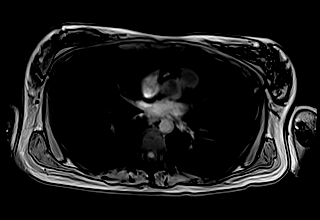

[Series 14: T1 · axial · 3.0mm · 1.25mm/px · z∈[+16,+325]mm · 3 of 104 slices shown (3 of 3)]
[im 1/104]
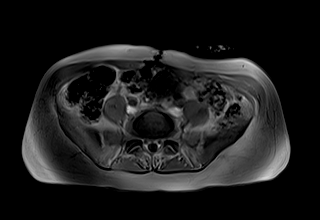
[im 52/104]
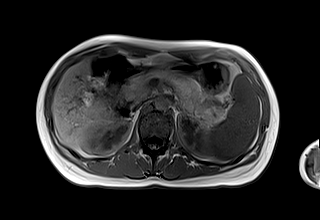
[im 104/104]
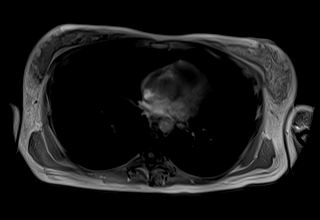

[Series 15: DWI · axial · 6.0mm · 1.49mm/px · z∈[+14,+338]mm · 2 of 92 slices shown (1 of 2)]
[im 1/92]
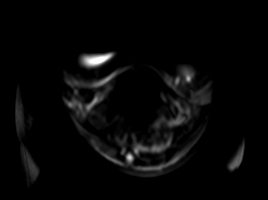
[im 92/92]
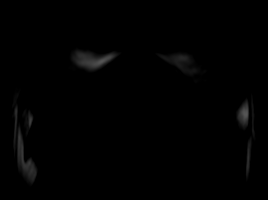

[Series 16: DWI · axial · 6.0mm · 1.49mm/px · 1 of 46 slices shown (2 of 2)]
[im 1/46]
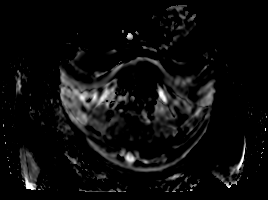

[Series 20: T2 fat-sat · axial · 6.0mm · 1.16mm/px · 1 of 44 slices shown]
[im 1/44]
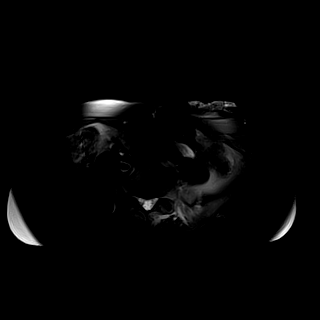

[Series 22: T1 dynamic · axial · 3.0mm · 1.25mm/px · z∈[+16,+325]mm · 3 of 104 slices shown (1 of 6)]
[im 1/104]
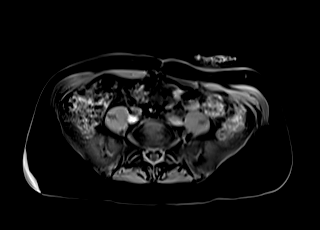
[im 52/104]
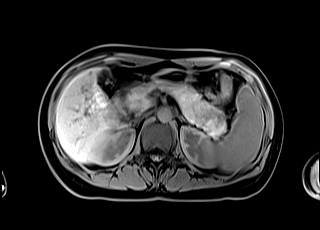
[im 104/104]
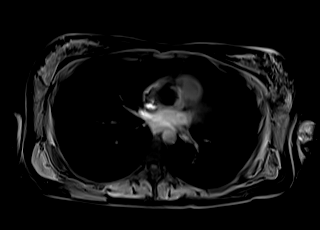

[Series 25: T1 dynamic · axial · 3.0mm · 1.25mm/px · z∈[+16,+325]mm · 3 of 104 slices shown (2 of 6)]
[im 1/104]
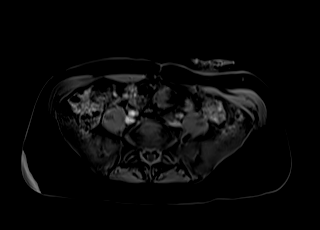
[im 52/104]
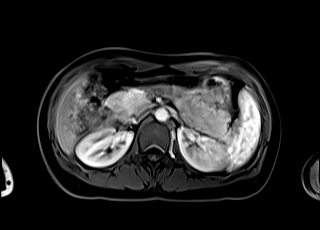
[im 104/104]
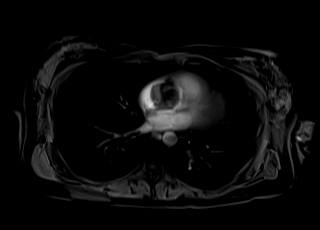

[Series 27: T1 dynamic · axial · 3.0mm · 1.25mm/px · z∈[+16,+325]mm · 3 of 104 slices shown (3 of 6)]
[im 1/104]
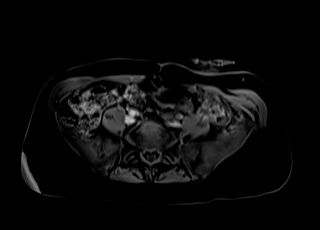
[im 52/104]
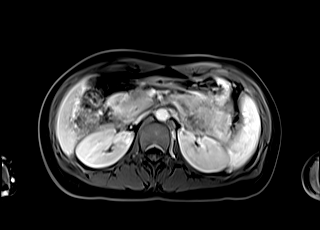
[im 104/104]
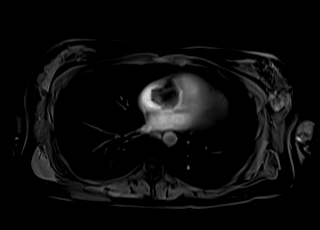

[Series 29: T1 dynamic · axial · 3.0mm · 1.25mm/px · z∈[+16,+325]mm · 3 of 104 slices shown (4 of 6)]
[im 1/104]
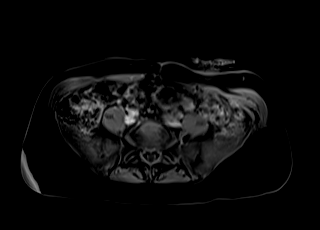
[im 52/104]
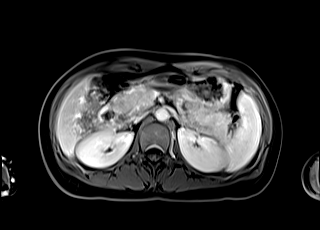
[im 104/104]
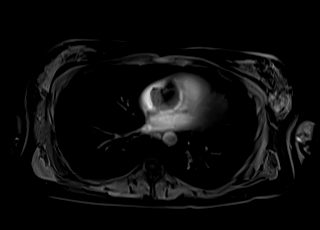

[Series 31: T1 dynamic · coronal · 4.0mm · 1.41mm/px · 1 of 56 slices shown (5 of 6)]
[im 1/56]
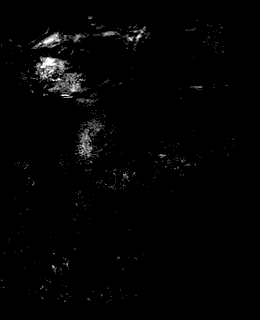

[Series 32: T2 · axial · 6.0mm · 1.56mm/px · 1 of 45 slices shown (6 of 6)]
[im 1/45]
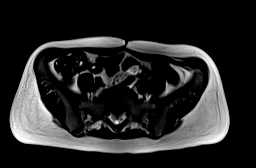

[Series 34: T1 dynamic · axial · 3.0mm · 1.25mm/px · z∈[+16,+325]mm · 3 of 104 slices shown (6 of 6)]
[im 1/104]
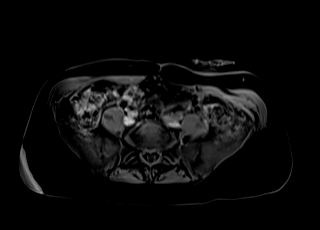
[im 52/104]
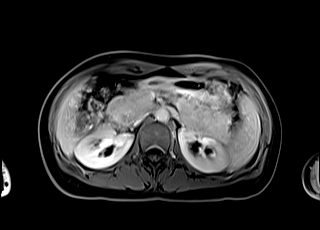
[im 104/104]
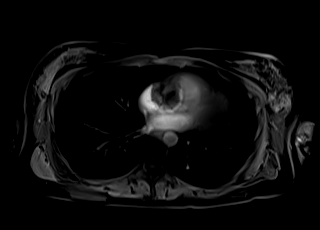

[Series 35: T1 fat-sat post-contrast · axial · 5.0mm · 0.51mm/px · 1 of 40 slices shown (1 of 2)]
[im 1/40]
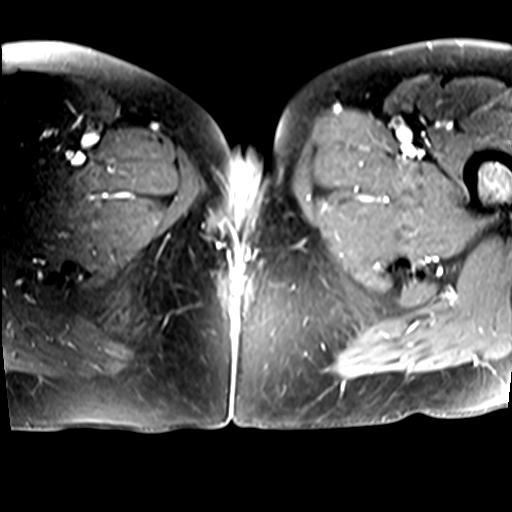

[Series 36: T1 fat-sat post-contrast · coronal · 4.0mm · 0.55mm/px · 1 of 43 slices shown (2 of 2)]
[im 1/43]
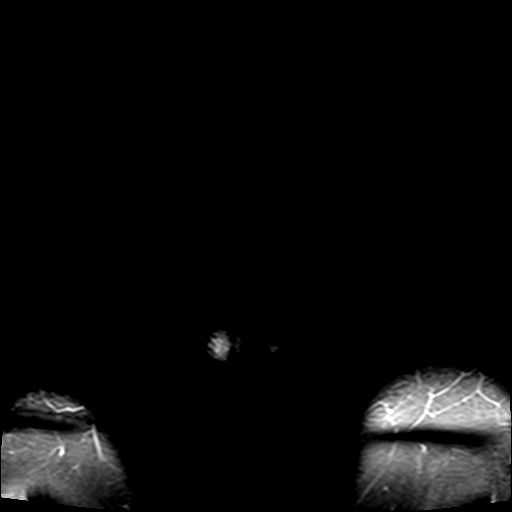

[Series 100: sub 20 sec · axial · 3.0mm · 1.25mm/px · z∈[+16,+325]mm · 3 of 104 slices shown]
[im 1/104]
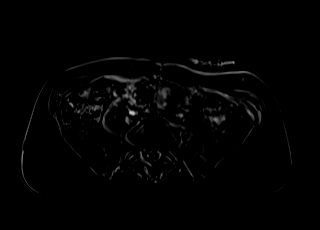
[im 52/104]
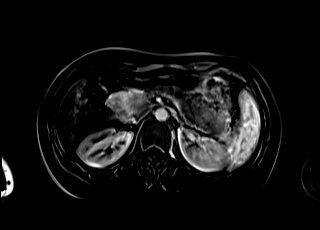
[im 104/104]
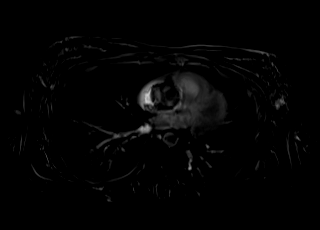

[Series 101: sub 45 sec · axial · 3.0mm · 1.25mm/px · z∈[+16,+325]mm · 3 of 104 slices shown]
[im 1/104]
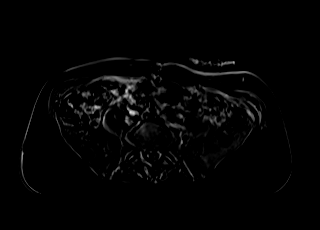
[im 52/104]
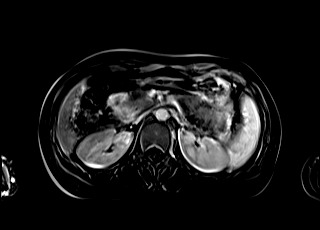
[im 104/104]
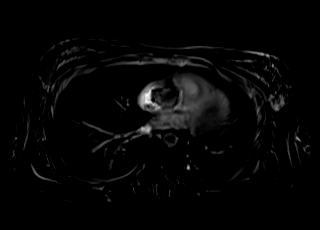

[Series 102: sub 90 sec · axial · 3.0mm · 1.25mm/px · z∈[+16,+169]mm · 2 of 104 slices shown]
[im 1/104]
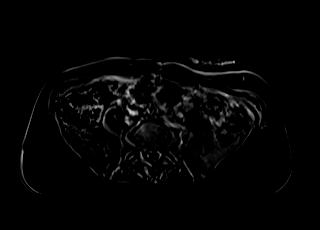
[im 52/104]
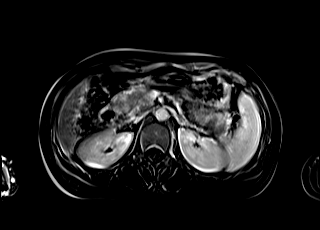

[44 of 48 positions shown; findings below may reference images not displayed]

FINDINGS: COMBINED FINDINGS FOR BOTH MR ABDOMEN AND PELVIS

Lower chest: No acute findings.

Hepatobiliary: Redemonstrated hypoenhancing lesion of the posterior
liver dome, hepatic segment VII, reduced in size compared to prior
examination, measuring 1.3 x 1.3 cm, previously 1.8 x 1.7 cm (series
13, image 28).

Pancreas: No mass, inflammatory changes, or other parenchymal
abnormality identified.

Spleen:  Unchanged mild splenomegaly, maximum coronal span 14.0 cm.

Adrenals/Urinary Tract: No masses identified. No evidence of
hydronephrosis.

Stomach/Bowel: Status post Hartmann procedure with left lower
quadrant end colostomy. The rectal stump is fluid-filled. Visualized
portions within the abdomen are otherwise unremarkable.

Vascular/Lymphatic: No pathologically enlarged lymph nodes
identified. No abdominal aortic aneurysm demonstrated.

Reproductive: Uterus and bilateral adnexa are unremarkable.

Other:  None.

Musculoskeletal: No suspicious bone lesions identified.
IMPRESSION: 1. Redemonstrated hypoenhancing lesion of the posterior liver dome,
hepatic segment VII, reduced in size compared to prior examination,
measuring 1.3 x 1.3 cm, previously 1.8 x 1.7 cm. Findings are
consistent with treatment response of a biopsy proven metastasis. No
other evidence of lymphadenopathy or metastatic disease within the
abdomen or pelvis.
2. Unchanged mild splenomegaly, maximum coronal span 14.0 cm.
3. Status post Hartmann procedure with left lower quadrant end
colostomy.

## 2020-11-09 IMAGING — MR MR ABDOMEN WO/W CM
25 of 26 series · 44 of 48 positions shown · IV contrast (gadavist)
Comparison: CT abdomen pelvis, [DATE]

CLINICAL DATA: Metastatic rectosigmoid colon cancer, liver
oligometastatic disease, planned liver resection

EXAM:
MRI ABDOMEN AND PELVIS WITHOUT AND WITH CONTRAST
TECHNIQUE: Multiplanar multisequence MR imaging of the abdomen and pelvis was
performed both before and after the administration of intravenous
contrast.
CONTRAST:  6mL GADAVIST GADOBUTROL 1 MMOL/ML IV SOLN

[Series 2: T2 · coronal · 6.0mm · 0.78mm/px · 1 of 34 slices shown (1 of 6)]
[im 1/34]
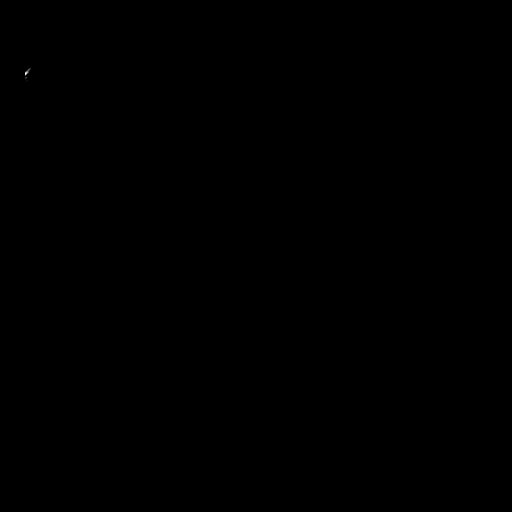

[Series 4: T2 · axial · 5.0mm · 0.51mm/px · 1 of 40 slices shown (2 of 6)]
[im 1/40]
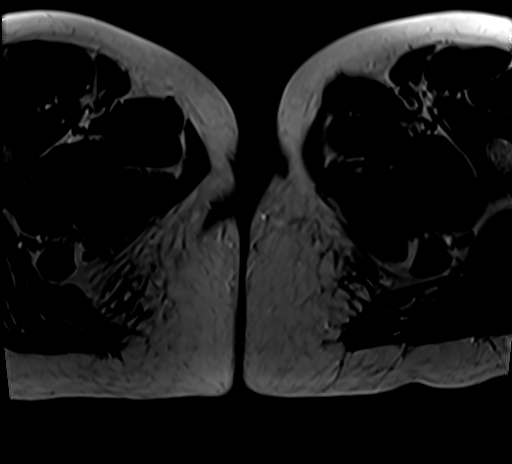

[Series 5: T2 · sagittal · 5.0mm · 0.55mm/px · 1 of 40 slices shown (3 of 6)]
[im 1/40]
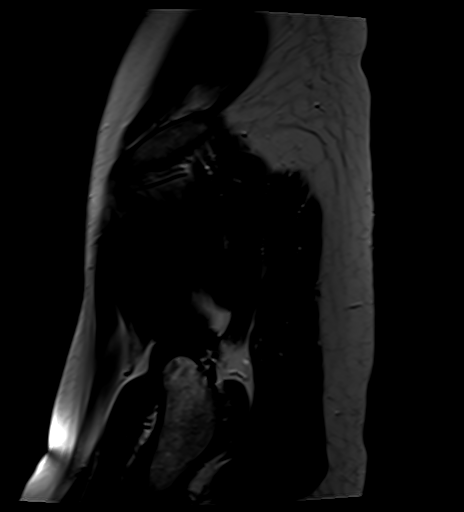

[Series 6: T2 · coronal · 6.0mm · 1.56mm/px · 1 of 30 slices shown (4 of 6)]
[im 1/30]
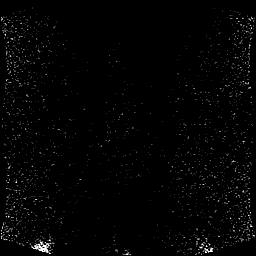

[Series 7: T1 · axial · 5.0mm · 0.51mm/px · 1 of 40 slices shown (1 of 3)]
[im 1/40]
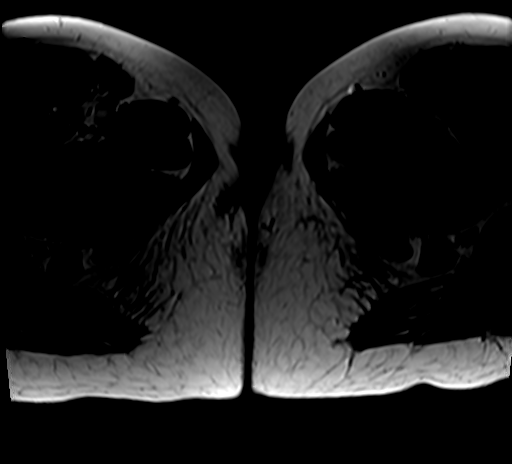

[Series 8: T1 fat-sat · axial · non-contrast · 5.0mm · 0.51mm/px · 1 of 40 slices shown]
[im 1/40]
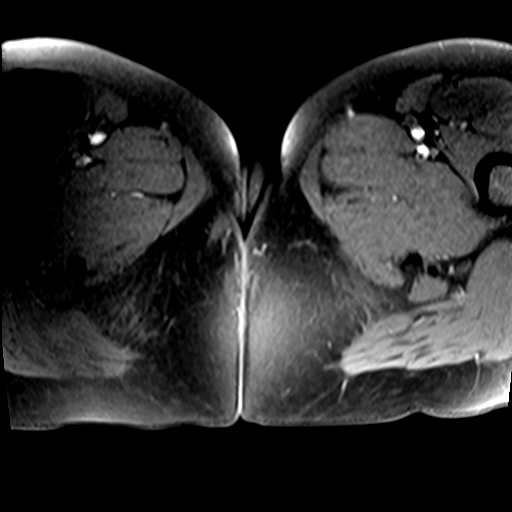

[Series 11: T2 · coronal · 6.0mm · 1.56mm/px · 1 of 27 slices shown (5 of 6)]
[im 1/27]
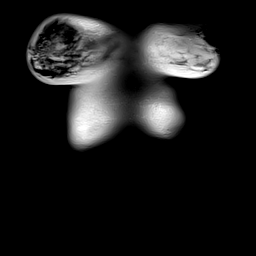

[Series 12: bSSFP · axial · 4.0mm · 0.78mm/px · 1 of 73 slices shown]
[im 1/73]
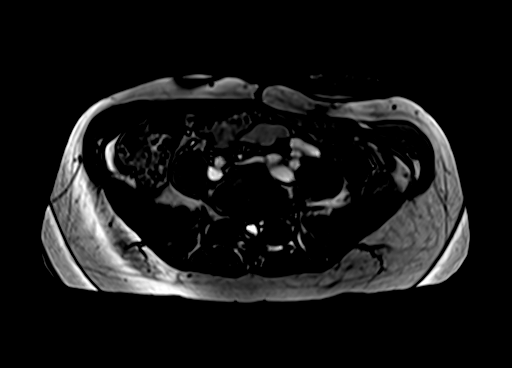

[Series 13: T1 · axial · 3.0mm · 1.25mm/px · z∈[+16,+325]mm · 2 of 104 slices shown (2 of 3)]
[im 1/104]
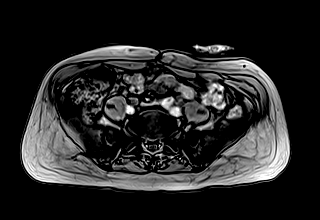
[im 104/104]
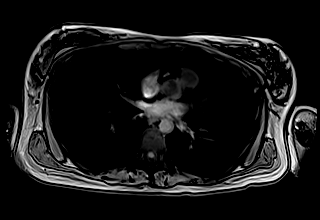

[Series 14: T1 · axial · 3.0mm · 1.25mm/px · z∈[+16,+325]mm · 3 of 104 slices shown (3 of 3)]
[im 1/104]
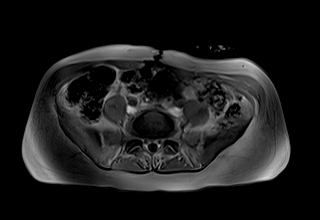
[im 52/104]
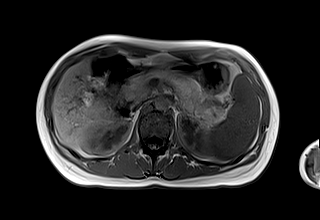
[im 104/104]
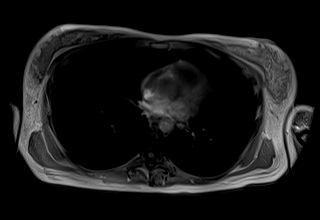

[Series 15: DWI · axial · 6.0mm · 1.49mm/px · z∈[+14,+338]mm · 2 of 92 slices shown (1 of 2)]
[im 1/92]
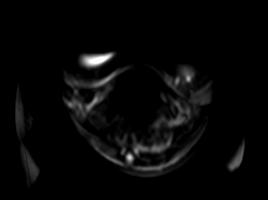
[im 92/92]
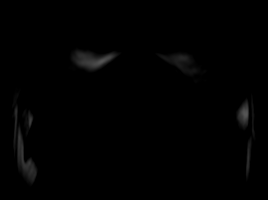

[Series 16: DWI · axial · 6.0mm · 1.49mm/px · 1 of 46 slices shown (2 of 2)]
[im 1/46]
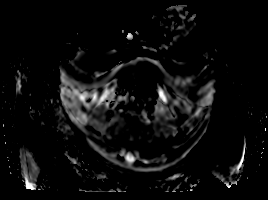

[Series 20: T2 fat-sat · axial · 6.0mm · 1.16mm/px · 1 of 44 slices shown]
[im 1/44]
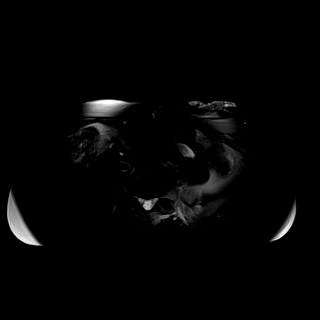

[Series 22: T1 dynamic · axial · 3.0mm · 1.25mm/px · z∈[+16,+325]mm · 3 of 104 slices shown (1 of 6)]
[im 1/104]
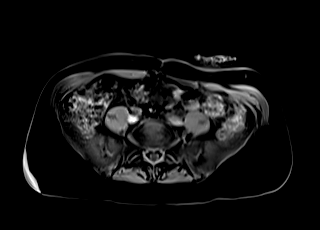
[im 52/104]
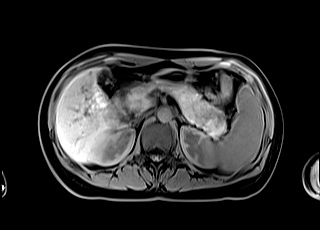
[im 104/104]
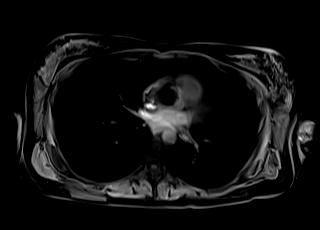

[Series 25: T1 dynamic · axial · 3.0mm · 1.25mm/px · z∈[+16,+325]mm · 3 of 104 slices shown (2 of 6)]
[im 1/104]
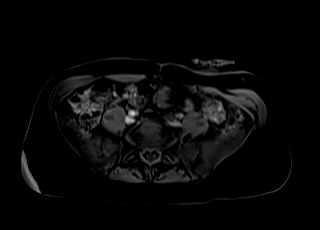
[im 52/104]
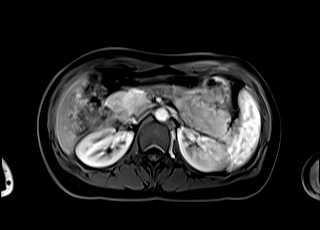
[im 104/104]
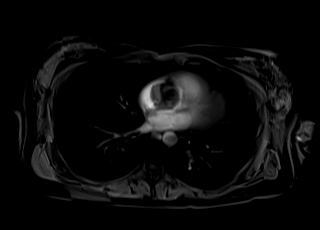

[Series 27: T1 dynamic · axial · 3.0mm · 1.25mm/px · z∈[+16,+325]mm · 3 of 104 slices shown (3 of 6)]
[im 1/104]
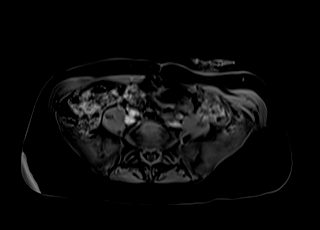
[im 52/104]
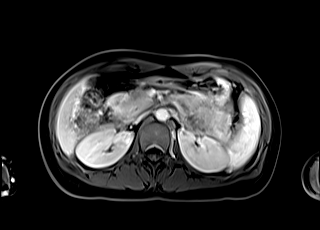
[im 104/104]
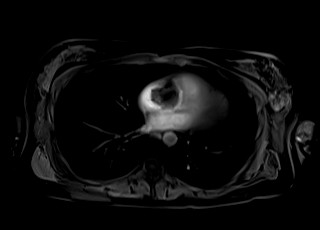

[Series 29: T1 dynamic · axial · 3.0mm · 1.25mm/px · z∈[+16,+325]mm · 3 of 104 slices shown (4 of 6)]
[im 1/104]
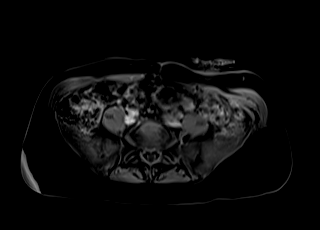
[im 52/104]
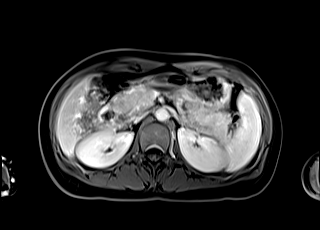
[im 104/104]
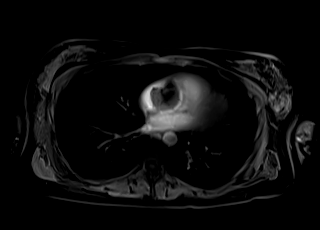

[Series 31: T1 dynamic · coronal · 4.0mm · 1.41mm/px · 1 of 56 slices shown (5 of 6)]
[im 1/56]
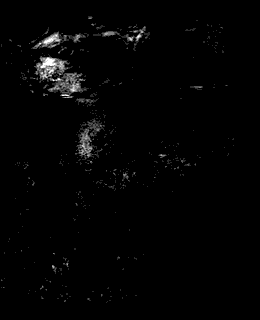

[Series 32: T2 · axial · 6.0mm · 1.56mm/px · 1 of 45 slices shown (6 of 6)]
[im 1/45]
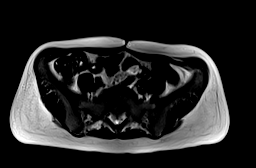

[Series 34: T1 dynamic · axial · 3.0mm · 1.25mm/px · z∈[+16,+325]mm · 3 of 104 slices shown (6 of 6)]
[im 1/104]
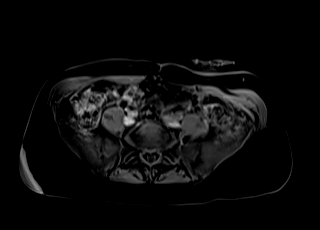
[im 52/104]
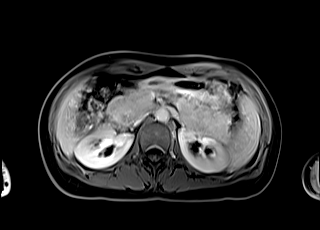
[im 104/104]
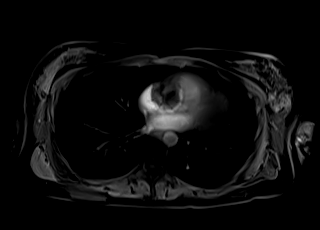

[Series 35: T1 fat-sat post-contrast · axial · 5.0mm · 0.51mm/px · 1 of 40 slices shown (1 of 2)]
[im 1/40]
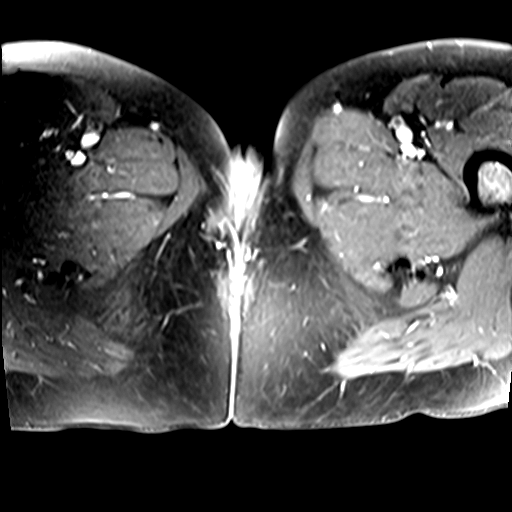

[Series 36: T1 fat-sat post-contrast · coronal · 4.0mm · 0.55mm/px · 1 of 43 slices shown (2 of 2)]
[im 1/43]
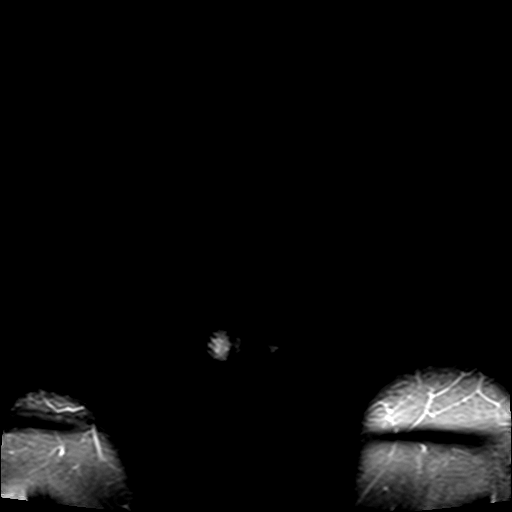

[Series 100: sub 20 sec · axial · 3.0mm · 1.25mm/px · z∈[+16,+325]mm · 3 of 104 slices shown]
[im 1/104]
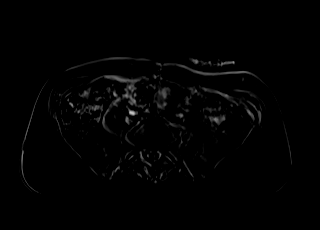
[im 52/104]
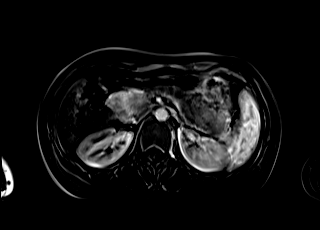
[im 104/104]
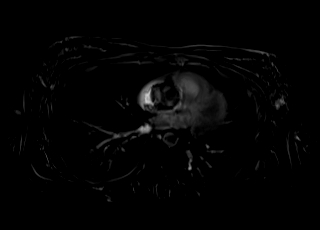

[Series 101: sub 45 sec · axial · 3.0mm · 1.25mm/px · z∈[+16,+325]mm · 3 of 104 slices shown]
[im 1/104]
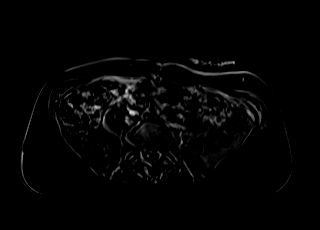
[im 52/104]
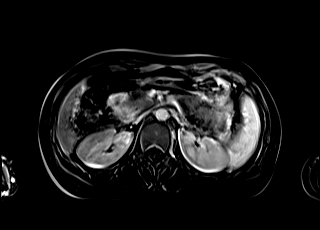
[im 104/104]
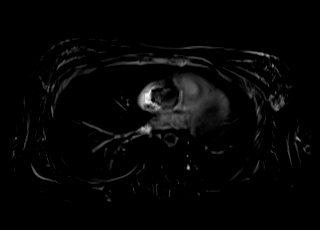

[Series 102: sub 90 sec · axial · 3.0mm · 1.25mm/px · z∈[+16,+169]mm · 2 of 104 slices shown]
[im 1/104]
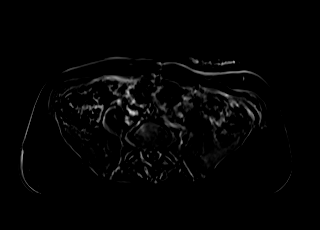
[im 52/104]
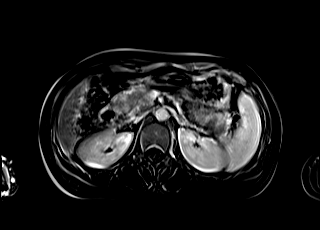

[44 of 48 positions shown; findings below may reference images not displayed]

FINDINGS: COMBINED FINDINGS FOR BOTH MR ABDOMEN AND PELVIS

Lower chest: No acute findings.

Hepatobiliary: Redemonstrated hypoenhancing lesion of the posterior
liver dome, hepatic segment VII, reduced in size compared to prior
examination, measuring 1.3 x 1.3 cm, previously 1.8 x 1.7 cm (series
13, image 28).

Pancreas: No mass, inflammatory changes, or other parenchymal
abnormality identified.

Spleen:  Unchanged mild splenomegaly, maximum coronal span 14.0 cm.

Adrenals/Urinary Tract: No masses identified. No evidence of
hydronephrosis.

Stomach/Bowel: Status post Hartmann procedure with left lower
quadrant end colostomy. The rectal stump is fluid-filled. Visualized
portions within the abdomen are otherwise unremarkable.

Vascular/Lymphatic: No pathologically enlarged lymph nodes
identified. No abdominal aortic aneurysm demonstrated.

Reproductive: Uterus and bilateral adnexa are unremarkable.

Other:  None.

Musculoskeletal: No suspicious bone lesions identified.
IMPRESSION: 1. Redemonstrated hypoenhancing lesion of the posterior liver dome,
hepatic segment VII, reduced in size compared to prior examination,
measuring 1.3 x 1.3 cm, previously 1.8 x 1.7 cm. Findings are
consistent with treatment response of a biopsy proven metastasis. No
other evidence of lymphadenopathy or metastatic disease within the
abdomen or pelvis.
2. Unchanged mild splenomegaly, maximum coronal span 14.0 cm.
3. Status post Hartmann procedure with left lower quadrant end
colostomy.

## 2020-11-09 MED ORDER — GADOBUTROL 1 MMOL/ML IV SOLN
6.0000 mL | Freq: Once | INTRAVENOUS | Status: AC | PRN
  Administered 2020-11-09: 6 mL via INTRAVENOUS

## 2020-11-12 ENCOUNTER — Encounter (HOSPITAL_COMMUNITY): Payer: Self-pay

## 2020-11-12 ENCOUNTER — Other Ambulatory Visit: Payer: Self-pay

## 2020-11-12 ENCOUNTER — Ambulatory Visit (HOSPITAL_COMMUNITY)
Admission: RE | Admit: 2020-11-12 | Discharge: 2020-11-12 | Disposition: A | Discharge status: Home / Self Care | Payer: BC Managed Care – PPO | Source: Ambulatory Visit | Attending: Hematology | Admitting: Hematology

## 2020-11-12 ENCOUNTER — Other Ambulatory Visit: Payer: Self-pay | Admitting: General Surgery

## 2020-11-12 ENCOUNTER — Inpatient Hospital Stay: Payer: BC Managed Care – PPO | Attending: Nurse Practitioner

## 2020-11-12 DIAGNOSIS — C787 Secondary malignant neoplasm of liver and intrahepatic bile duct: Secondary | ICD-10-CM | POA: Insufficient documentation

## 2020-11-12 DIAGNOSIS — C19 Malignant neoplasm of rectosigmoid junction: Secondary | ICD-10-CM

## 2020-11-12 DIAGNOSIS — Z9221 Personal history of antineoplastic chemotherapy: Secondary | ICD-10-CM | POA: Diagnosis not present

## 2020-11-12 DIAGNOSIS — Z933 Colostomy status: Secondary | ICD-10-CM | POA: Diagnosis not present

## 2020-11-12 DIAGNOSIS — Z862 Personal history of diseases of the blood and blood-forming organs and certain disorders involving the immune mechanism: Secondary | ICD-10-CM | POA: Diagnosis not present

## 2020-11-12 DIAGNOSIS — D6959 Other secondary thrombocytopenia: Secondary | ICD-10-CM | POA: Diagnosis not present

## 2020-11-12 LAB — CBC WITH DIFFERENTIAL (CANCER CENTER ONLY)
Abs Immature Granulocytes: 0.01 10*3/uL (ref 0.00–0.07)
Basophils Absolute: 0.1 10*3/uL (ref 0.0–0.1)
Basophils Relative: 1 %
Eosinophils Absolute: 0.1 10*3/uL (ref 0.0–0.5)
Eosinophils Relative: 2 %
HCT: 45 % (ref 36.0–46.0)
Hemoglobin: 14.7 g/dL (ref 12.0–15.0)
Immature Granulocytes: 0 %
Lymphocytes Relative: 23 %
Lymphs Abs: 1.2 10*3/uL (ref 0.7–4.0)
MCH: 32.7 pg (ref 26.0–34.0)
MCHC: 32.7 g/dL (ref 30.0–36.0)
MCV: 100 fL (ref 80.0–100.0)
Monocytes Absolute: 0.5 10*3/uL (ref 0.1–1.0)
Monocytes Relative: 9 %
Neutro Abs: 3.3 10*3/uL (ref 1.7–7.7)
Neutrophils Relative %: 65 %
Platelet Count: 92 10*3/uL — ABNORMAL LOW (ref 150–400)
RBC: 4.5 MIL/uL (ref 3.87–5.11)
RDW: 13.8 % (ref 11.5–15.5)
WBC Count: 5.1 10*3/uL (ref 4.0–10.5)
nRBC: 0 % (ref 0.0–0.2)

## 2020-11-12 LAB — CMP (CANCER CENTER ONLY)
ALT: 79 U/L — ABNORMAL HIGH (ref 0–44)
AST: 48 U/L — ABNORMAL HIGH (ref 15–41)
Albumin: 4.2 g/dL (ref 3.5–5.0)
Alkaline Phosphatase: 108 U/L (ref 38–126)
Anion gap: 7 (ref 5–15)
BUN: 10 mg/dL (ref 6–20)
CO2: 24 mmol/L (ref 22–32)
Calcium: 9.3 mg/dL (ref 8.9–10.3)
Chloride: 110 mmol/L (ref 98–111)
Creatinine: 0.77 mg/dL (ref 0.44–1.00)
GFR, Estimated: >60 mL/min (ref >60)
Glucose, Bld: 86 mg/dL (ref 70–99)
Potassium: 3.9 mmol/L (ref 3.5–5.1)
Sodium: 141 mmol/L (ref 135–145)
Total Bilirubin: 0.4 mg/dL (ref 0.3–1.2)
Total Protein: 7.3 g/dL (ref 6.5–8.1)

## 2020-11-12 IMAGING — CT CT CHEST W/O CM
2 of 4 series · 15 of 36 positions shown, 18 images · non-contrast
Comparison: [DATE] chest CT.

CLINICAL DATA: Metastatic colorectal cancer status post
chemotherapy. Restaging. Recent fall onto upper left back with
bruising in vein.

EXAM:
CT CHEST WITHOUT CONTRAST
TECHNIQUE: Multidetector CT imaging of the chest was performed following the
standard protocol without IV contrast.

[Series 2: thorax · axial · 0.70mm/px · z∈[-200,+72]mm · 12 of 162 slices shown, 15 images]
[im 13/162  mediastinal]
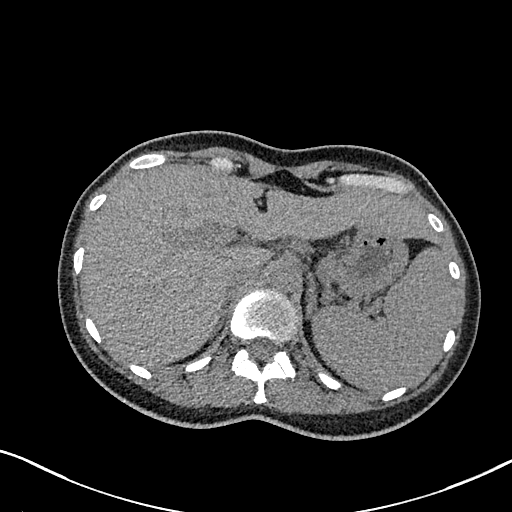
[im 13/162  lung]
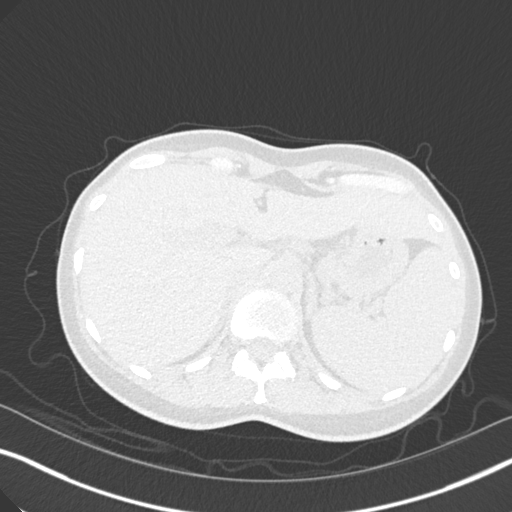
[im 25/162  lung]
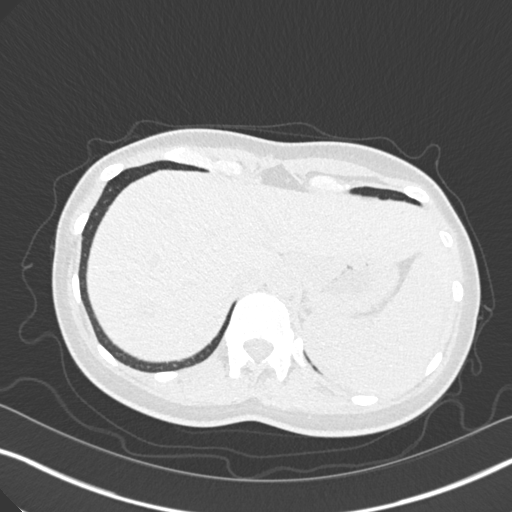
[im 38/162  lung]
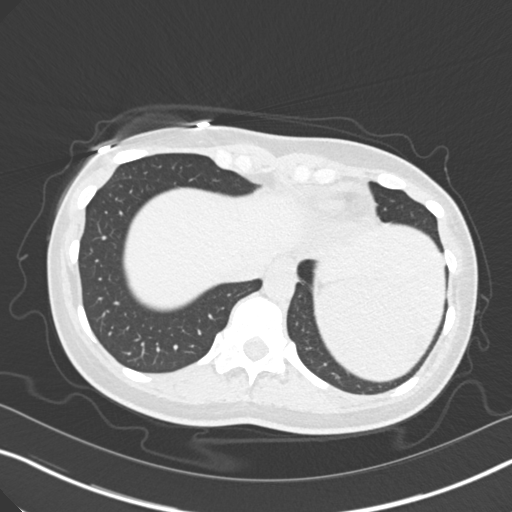
[im 50/162  lung]
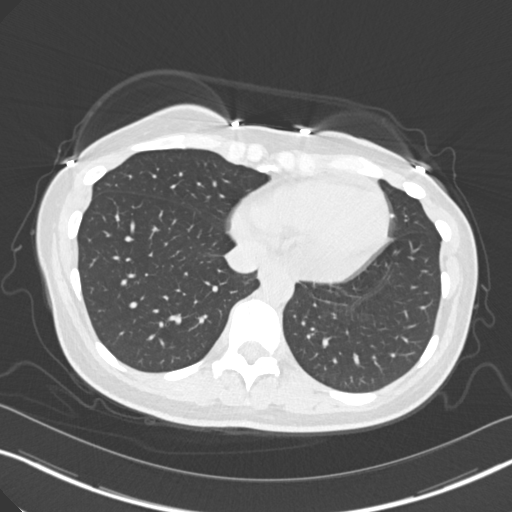
[im 62/162  mediastinal]
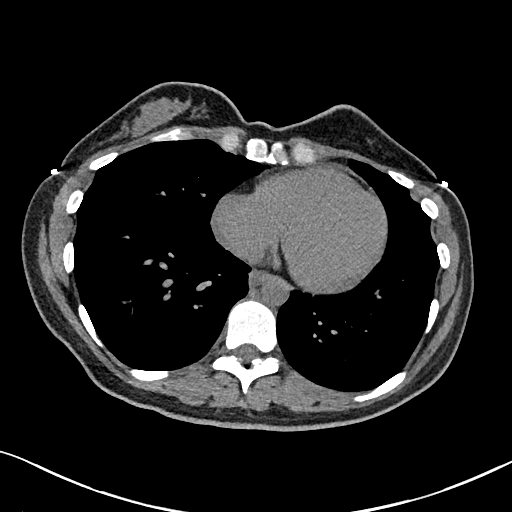
[im 62/162  lung]
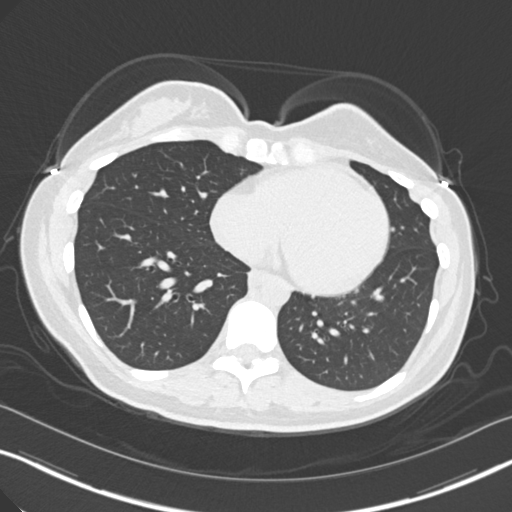
[im 75/162  lung]
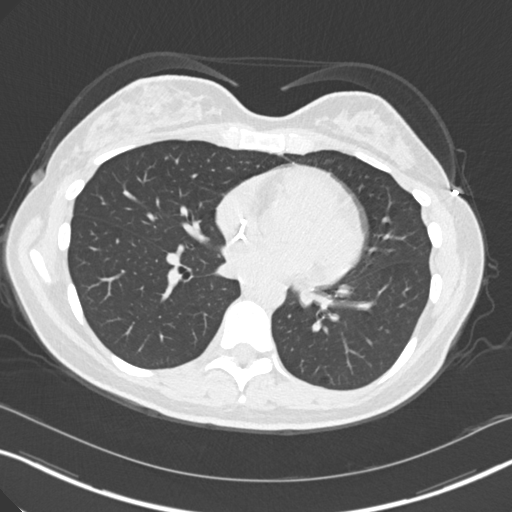
[im 87/162  lung]
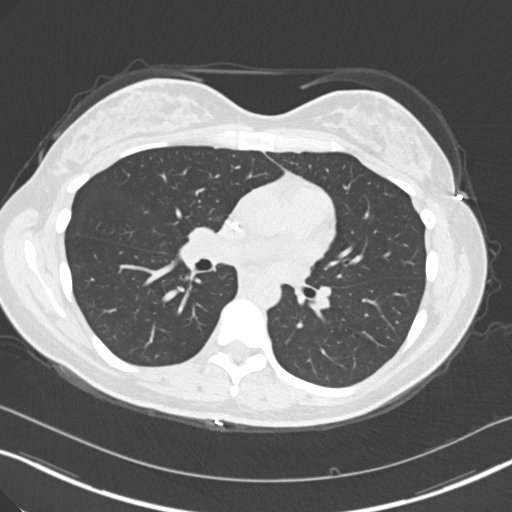
[im 100/162  lung]
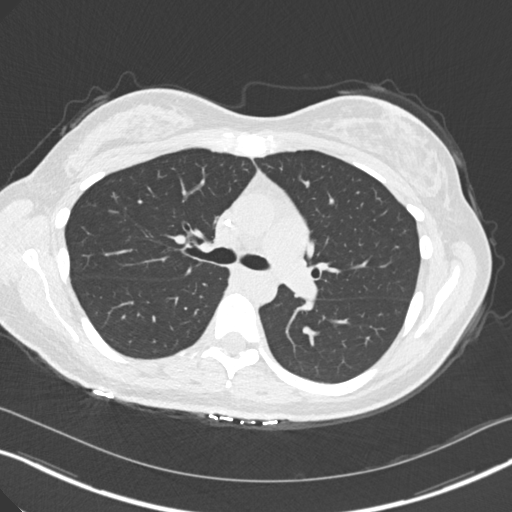
[im 112/162  mediastinal]
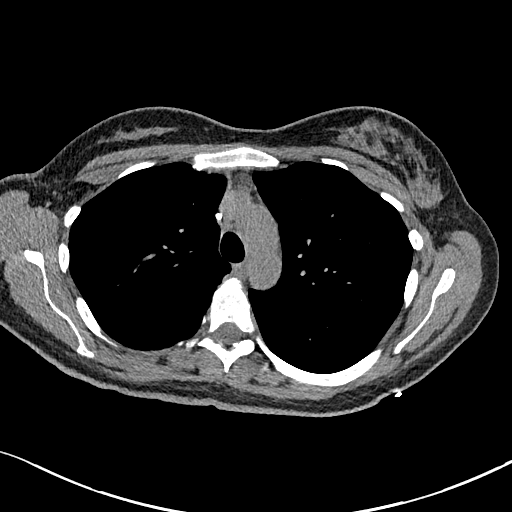
[im 112/162  lung]
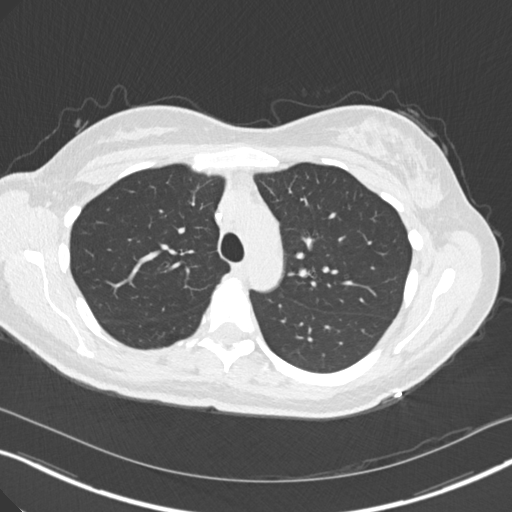
[im 124/162  lung]
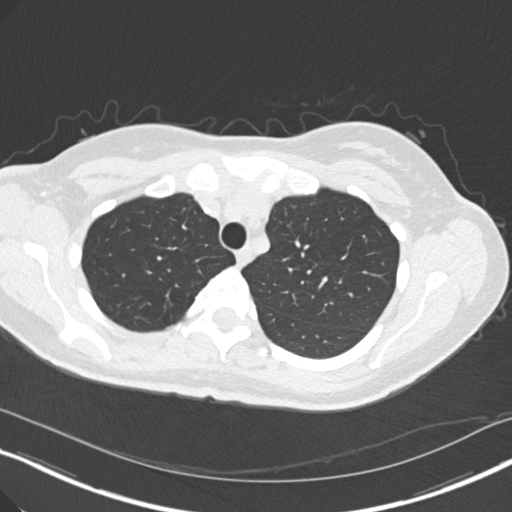
[im 137/162  lung]
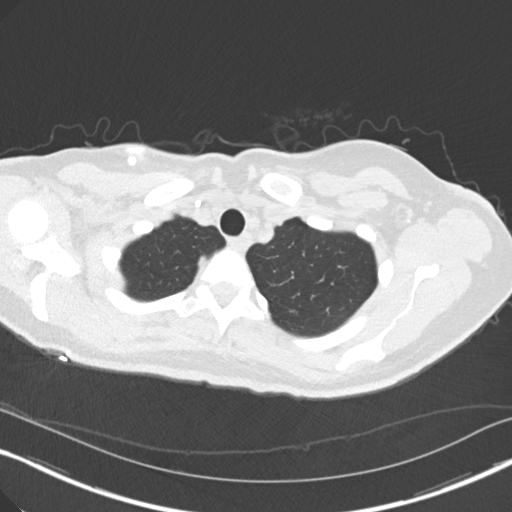
[im 149/162  lung]
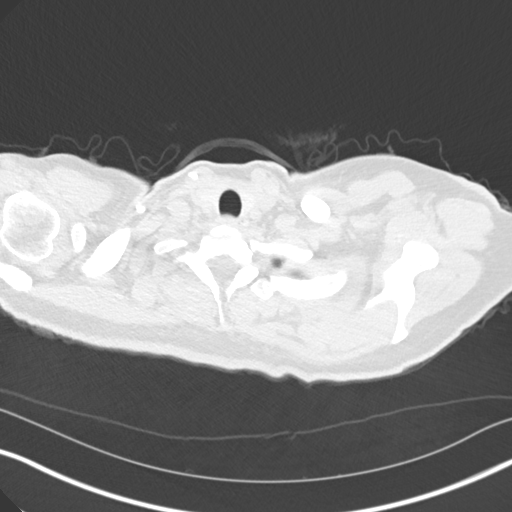

[Series 6: coronal · coronal · 0.63mm/px · 3 of 131 slices shown]
[im 27/131  lung]
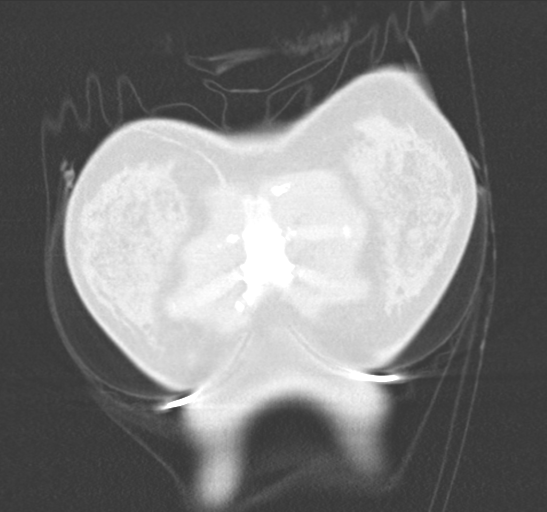
[im 53/131  lung]
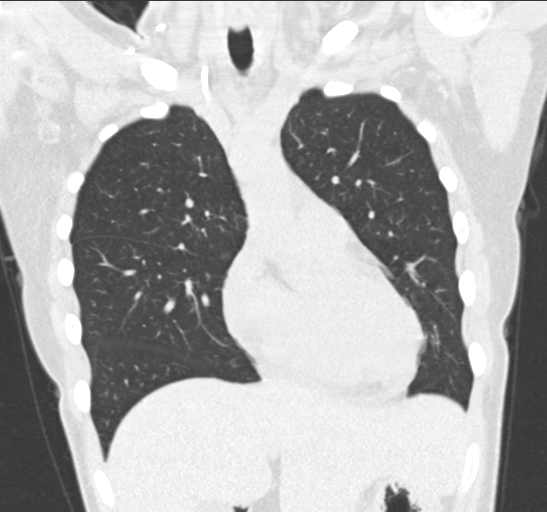
[im 79/131  lung]
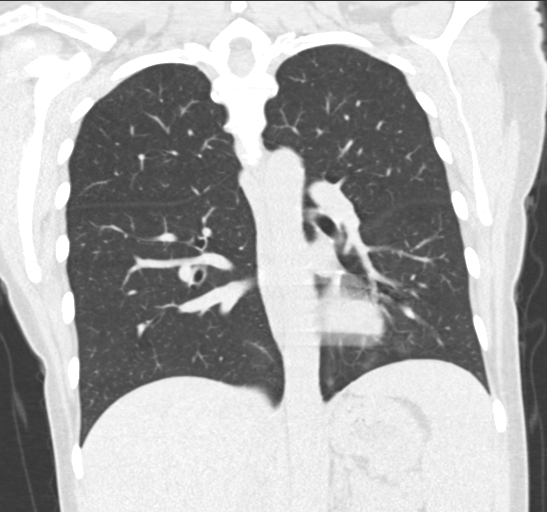

[15 of 36 positions shown; findings below may reference images not displayed]

FINDINGS: Cardiovascular: Normal heart size. No significant pericardial
effusion/thickening. Right internal jugular Port-A-Cath terminates
at the cavoatrial junction. Normal course and caliber of the
thoracic aorta. Top-normal caliber main pulmonary artery (3.0 cm
diameter).

Mediastinum/Nodes: No discrete thyroid nodules. Unremarkable
esophagus. No pathologically enlarged axillary, mediastinal or hilar
lymph nodes, noting limited sensitivity for the detection of hilar
adenopathy on this noncontrast study.

Lungs/Pleura: No pneumothorax. No pleural effusion. No acute
consolidative airspace disease, lung masses or significant pulmonary
nodules.

Upper abdomen: Hypodense 1.3 cm segment 7 right liver mass (series
2/image 138), stable since [DATE] MRI.

Musculoskeletal:  No aggressive appearing focal osseous lesions.
IMPRESSION: 1. No evidence of metastatic disease in the chest.
2. Known segment 7 right liver 1.3 cm metastasis, stable since
recent [DATE] MRI.

## 2020-11-12 NOTE — Progress Notes (Signed)
Stephanie [name]   [phone] Fax:([phone]) [phone]   Clinic Follow up Note   Patient Care Team: Patient, [name] as PCP - General (General Practice) [name], RN as Oncology Nurse Navigator [name], NP as Nurse Practitioner (Nurse Practitioner) [name], MD as Consulting Physician (Oncology) [name], MD as Consulting Physician (General Surgery)  Date of Service:  11/15/2020  CHIEF COMPLAINT:  f/u of metastatic colon cancer  SUMMARY OF ONCOLOGIC HISTORY: Oncology History Overview Note  Cancer Staging Malignant neoplasm of rectosigmoid junction [facility]) Staging form: Colon and Rectum, AJCC 8th Edition - Pathologic stage from 04/04/2020: pT4a, pN1b, cM1 - Signed by [name], NP on 05/07/2020    Malignant neoplasm of rectosigmoid junction ([place])  11/10/2019 Imaging   MRI Abdomen  IMPRESSION: 1. Redemonstrated hypoenhancing lesion of the posterior liver dome, hepatic segment VII, reduced in size compared to prior examination, measuring 1.3 x 1.3 cm, previously 1.8 x 1.7 cm. Findings are consistent with treatment response of a biopsy proven metastasis. No other evidence of lymphadenopathy or metastatic disease within the abdomen or pelvis. 2. Unchanged mild splenomegaly, maximum coronal span 14.0 cm. 3. 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   03/12/2020 Initial Diagnosis   Malignant neoplasm of rectosigmoid junction ([place])   03/12/2020 Imaging   CT AP with contrast IMPRESSION: 1. Overall findings are highly concerning for colorectal carcinoma involving the sigmoid colon with an associated perforation and adjacent abscess and phlegmon formation as detailed above. Currently, no collection is amenable to percutaneous drainage given their small size and location. 2. New 2 cm mass in the right hepatic lobe concerning for metastatic disease to the liver until proven otherwise. 3. Enlarged regional lymph  nodes as detailed above is concerning for nodal metastatic disease. 4. Large stool burden. 5. Prominent pelvic veins which can be seen in patients with pelvic congestion syndrome.   03/13/2020 Imaging   ABD US IMPRESSION: Approximately 2.1 x 2.4 x 2.0 cm lobular homogeneously echogenic lesion in the right hepatic dome corresponds with the abnormality seen on the prior CT scan. Sonographically, this appearance is highly suggestive of a benign hemangioma.   Recommend MRI of the abdomen with gadolinium contrast which may provide a noninvasive diagnosis of benign hemangioma.    03/13/2020 Imaging   MR ABD W/WO CONTRAST Hepatobiliary: Diffuse low signal intensity throughout the hepatic parenchyma on T2 weighted images, presumably a consequence of recent Feraheme injection. In segment 7 of the liver (axial image 8 of series 5) there is a 2.5 x 1.9 cm well-defined lesion which is slightly T2 hyperintense. This lesion appears hyperintense on pre gadolinium T1 weighted images (likely a consequence of Feraheme). Interpretation of enhancement within the lesion is compromised by presence of Feraheme. No other hepatic lesions are confidently identified on today's examination. No intra or extrahepatic biliary ductal dilatation. Gallbladder is normal in appearance.   03/31/2020 Imaging   CT AP W contrast IMPRESSION: 1. Previously noted sigmoid colon mass appears increased in size, and again appears to be associated with a focal contained perforation which crosses the midline and has fistulized into the left adnexal region where there is now what appears to be a large left tubo-ovarian abscess, as detailed above. This is also associated with multiple enlarged lymph nodes in the pelvis measuring up to 1.2 cm in short axis and borderline enlarged retroperitoneal lymph nodes, concerning for metastatic disease. In addition, previously suspected metastatic lesion in segment 7 of the  liver has  enlarged. 2. Small volume of ascites. 3. Additional incidental findings, as above.   04/04/2020 Cancer Staging   Staging form: Colon and Rectum, AJCC 8th Edition - Pathologic stage from 04/04/2020: pT4a, pN1b, cM1 - Signed by [name], NP on 05/07/2020   04/04/2020 Procedure   Paracentesis, path showed no malignant cells (mixed acute and chronic inflammation present)   04/04/2020 Surgery   Open sigmoid colectomy and end colostomy by Dr. [name]   04/04/2020 Pathology Results   FINAL MICROSCOPIC DIAGNOSIS: A. COLON, RECTOSIGMOID, RESECTION: - Invasive moderately differentiated adenocarcinoma, 6 cm, involving rectosigmoid junction - Carcinoma invades into serosal surface with perforation and associated serositis - Radial resection margin is positive for carcinoma; proximal and distal margins are not involved - Lymphovascular invasion is present - Metastatic carcinoma to one of fifteen lymph nodes (1/15); one tumor deposit - See oncology table B. LYMPH NODES, MESENTERIC, RESECTION: - Metastatic adenocarcinoma to one of six lymph nodes (1/6) - One tumor deposit  Addendum to note 2 involved lymph nodes (of 21 examined nodes) pT4a,pN1b MMR-normal, preserved expression of MLH1, MSH2, MSH6, PMS2   04/12/2020 Procedure   PAC placement    04/13/2020 Imaging   CT chest without contrast IMPRESSION: Interval development of bilateral pleural effusions, left slightly greater than right, with resultant bibasilar atelectasis including subtotal collapse of the left lower lobe. No evidence of intrathoracic metastatic disease, though evaluation of the collapsed parenchyma is limited. Hepatic metastasis again demonstrated.     04/16/2020 Pathology Results   FINAL MICROSCOPIC DIAGNOSIS:  A. LIVER, RIGHT LOBE, BIOPSY:  - Adenocarcinoma.  COMMENT:  The morphology is compatible with the provided clinical history of colorectal carcinoma.    05/23/2020 - 10/24/2020 Chemotherapy   FOLFIRINOX  q2weeks for 3-6 months starting 05/23/20. Bevacizumab-bvzr [name] added with C2. Oxaliplatin held with C11-12 due to reaction. (pt developed SOB, chest palpitation and abdominal discomfort shortly after oxaliplatin started). Completed on 10/24/20.   08/05/2020 Imaging   CT AP  IMPRESSION: 1. Postsurgical changes of distal colectomy with a left lower quadrant end ostomy. No evidence of obstruction or acute complication at this time. Excluded rectal pouch in the deep pelvis without acute complication or worrisome features. 2. Slight interval decrease in size of a hypoattenuating lesion posterior right lobe liver measuring 1.7 x 1.8 x 2 cm. This lesion has previously undergone ultrasound-guided biopsy with pathologic results demonstrating adenocarcinoma compatible with metastatic disease from patient's resected colorectal carcinoma. 3. Slight prominence of the parametrial vessels bilaterally, nonspecific though can be seen in the setting of pelvic congestion syndrome. 4. Mild splenomegaly.  No focal lesion.     11/12/2020 Imaging   CT Chest  IMPRESSION: 1. No evidence of metastatic disease in the chest. 2. 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      CURRENT THERAPY:  PENDING Surgery   INTERVAL HISTORY:  Stephanie [name] is here for a follow up. She presents to the clinic alone.  She is doing well overall, mild fatigue, no other complaints.  She tolerated last cycle of chemotherapy very well without significant side effects.  All other systems were reviewed with the patient and are negative.  MEDICAL HISTORY:  Past Medical History:  Diagnosis Date  . BV (bacterial vaginosis)   . Cancer ([place])   . Lactose intolerance 03/12/2020  . UTI (lower urinary tract infection)     SURGICAL HISTORY: Past Surgical History:  Procedure Laterality Date  . CESAREAN SECTION    .  CYSTOSCOPY WITH STENT PLACEMENT  04/04/2020   Procedure: CYSTOSCOPY  WITH STENT PLACEMENT;  Surgeon: [name], MD;  Location: [facility];  Service: General;;  . LAPAROTOMY N/A 04/04/2020   Procedure: EXPLORATORY LAPAROTOMY;  Surgeon: [name], MD;  Location: [facility];  Service: General;  Laterality: N/A;  . PORTACATH PLACEMENT Right 04/12/2020   Procedure: INSERTION PORT-A-CATH WITH ULTRASOUND;  Surgeon: Kieth Brightly Arta Bruce, MD;  Location: [facility];  Service: General;  Laterality: Right;    I have reviewed the social history and family history with the patient and they are unchanged from previous note.  ALLERGIES:  is allergic to oxaliplatin.  MEDICATIONS:  Current Outpatient Medications  Medication Sig Dispense Refill  . acetaminophen (TYLENOL) 325 MG tablet Take 650 mg by mouth daily as needed for fever or headache (pain).    [name] diclofenac Sodium (VOLTAREN) 1 % GEL Apply 2 g topically 4 (four) times daily. (Patient not taking: Reported on 08/05/2020) 50 g 0  . dicyclomine (BENTYL) 10 MG capsule Take 1 capsule (10 mg total) by mouth 3 (three) times daily before meals. (Patient taking differently: Take 10 mg by mouth 2 (two) times daily as needed (stomach cramps). ) 60 capsule 1  . famotidine (PEPCID) 20 MG tablet Take 1 tablet (20 mg total) by mouth 2 (two) times daily. (Patient not taking: Reported on 08/05/2020) 60 tablet 0  . ferrous sulfate 325 (65 FE) MG tablet Take 1 tablet (325 mg total) by mouth 2 (two) times daily with a meal. (Patient not taking: Reported on 08/05/2020) 60 tablet 0  . lidocaine-prilocaine (EMLA) cream Apply to affected area once (Patient taking differently: Apply 1 application topically as needed (prior to port access). ) 30 g 3  . LORazepam (ATIVAN) 0.5 MG tablet Take 1 tablet (0.5 mg total) by mouth 2 (two) times daily as needed for anxiety. 30 tablet 0  . metroNIDAZOLE (FLAGYL) 500 MG tablet Take 1 tablet (500 mg total) by mouth 2 (two) times daily. 14 tablet 0  . ondansetron (ZOFRAN) 8 MG tablet Take 1 tablet (8 mg total) by mouth 2  (two) times daily as needed. Start on day 3 after chemotherapy. (Patient taking differently: Take 8 mg by mouth 2 (two) times daily as needed for nausea or vomiting. Start on day 3 after chemotherapy.) 30 tablet 5  . pantoprazole (PROTONIX) 40 MG tablet Take 1 tablet (40 mg total) by mouth 2 (two) times daily. 60 tablet 5  . PARAGARD INTRAUTERINE COPPER IU 1 Device by Intrauterine route once.     . polyethylene glycol (MIRALAX / GLYCOLAX) 17 g packet Take 17 g by mouth 2 (two) times daily. (Patient taking differently: Take 17 g by mouth 2 (two) times daily as needed (constipation). ) 14 each 0  . potassium chloride SA (KLOR-CON) 20 MEQ tablet Take 1 tablet (20 mEq total) by mouth daily. (Patient not taking: Reported on 08/05/2020) 30 tablet 1  . prochlorperazine (COMPAZINE) 10 MG tablet Take 1 tablet (10 mg total) by mouth every 6 (six) hours as needed (Nausea or vomiting). 30 tablet 5  . senna-docusate (SENOKOT-S) 8.6-50 MG tablet Take 2 tablets by mouth 2 (two) times daily. (Patient taking differently: Take 2 tablets by mouth 2 (two) times daily as needed (constipation). ) 120 tablet 0  . traMADol (ULTRAM) 50 MG tablet Take 1 tablet (50 mg total) by mouth every 12 (twelve) hours as needed for severe pain. 20 tablet 0  . valACYclovir (VALTREX) 500 MG tablet Take 1 tablet (  500 mg total) by mouth 2 (two) times daily. 30 tablet 0   No current facility-administered medications for this visit.    PHYSICAL EXAMINATION: ECOG PERFORMANCE STATUS: 1 - Symptomatic but completely ambulatory  Vitals:   11/15/20 1336  BP: 116/75  Pulse: 82  Resp: 16  Temp: 98 F (36.7 C)  SpO2: 100%   Filed Weights   11/15/20 1336  Weight: 150 lb 6.4 oz (68.2 kg)    GENERAL:alert, no distress and comfortable SKIN: skin color, texture, turgor are normal, no rashes or significant lesions EYES: normal, Conjunctiva are pink and non-injected, sclera clear Musculoskeletal:no cyanosis of digits and no clubbing  NEURO:  alert & oriented x 3 with fluent speech, no focal motor/sensory deficits  LABORATORY DATA:  I have reviewed the data as listed CBC Latest Ref Rng & Units 11/12/2020 10/24/2020 10/10/2020  WBC 4.0 - 10.5 K/uL 5.1 7.8 8.9  Hemoglobin 12.0 - 15.0 g/dL 14.7 13.3 15.0  Hematocrit 36.0 - 46.0 % 45.0 40.4 46.4(H)  Platelets 150 - 400 K/uL 92(L) 85(L) 93(L)     CMP Latest Ref Rng & Units 11/12/2020 10/24/2020 10/10/2020  Glucose 70 - 99 mg/dL 86 102(H) 75  BUN 6 - 20 mg/dL 10 8 5(L)  Creatinine 0.44 - 1.00 mg/dL 0.77 0.67 0.73  Sodium 135 - 145 mmol/L 141 141 143  Potassium 3.5 - 5.1 mmol/L 3.9 4.1 4.3  Chloride 98 - 111 mmol/L 110 108 108  CO2 22 - 32 mmol/L _0 Calcium 8.9 - 10.3 mg/dL 9.3 8.9 9.8  Total Protein 6.5 - 8.1 g/dL 7.3 6.7 7.7  Total Bilirubin 0.3 - 1.2 mg/dL 0.4 0.2(L) 0.3  Alkaline Phos 38 - 126 U/L 108 124 115  AST 15 - 41 U/L 48(H) 62(H) 52(H)  ALT 0 - 44 U/L 79(H) 127(H) 65(H)      RADIOGRAPHIC STUDIES: I have personally reviewed the radiological images as listed and agreed with the findings in the report. No results found.   ASSESSMENT & PLAN:  Stephanie [name] is a 40 y.o. 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 Barry Dienes on 04/04/20. She was found to have perforation and positive radial margin.Liver biopsy on 04/16/2020 confirmed metastatic disease from her colon cancer -Work up isconsistent with stage IV colon cancer s/p surgical resection of the primary tumor with oligo liver metastasis, overall the disease burden appears to be low.  -Istarted her onfirst line chemo withFOLFOXIRI q2 weeks for 16month beginning 05/23/20. Bevacizumab-bvzr (Zirabev)added with C2.She completed on 10/24/20.  -She plans to have liver resection with Dr  BBarry Dienes surgery is scheduled for 12/13/2020. She will proceed with ostomy reversal separate and later. I discussed that she will not need adjuvant therapy after liver surgery unless she has positive margins on surgical path I may offer adjuvant chemo or Radiation. -I discussed her risk of recurrence after surgery, which remains to be high. Will proceed with 5 years surveillance plan includingGuardantReveal Testing after surgery. Will repeat CT scan every 3-4 month for first year, then every 6 months for year 2-3,then yearly up to 5 years.  -I personally reviewed and discussed her 10/2020 CT Chest and MRI abdomen which shows decreased oligo liver metastasis to 1.3cm, no evidence of metastasis. -She has recovered well from chemotherapy, ready for surgery via months. -Follow-up in 2  months.  2.Anemia, secondary to #1 and iron deficiencyfrom GI Blood loss. Resolved s/p IVFeraheme in 6/2021and8/13/21. -anemia resolved now   3. Mild thrombocytopenia -Secondary to chemotherapy -improving   4. Genetics  -Due to her young age, I recommended genetic testing to ruled out cancer syndrome.  She initially wanted to wait due to her insurance issue -will refer her to genetics   Plan: -she is scheduled for liver surgery on 3/17 -genetic referral -lab, flush and f/u in 2 months    No problem-specific Assessment & Plan notes found for this encounter.   No orders of the defined types were placed in this encounter.  All questions were answered. The patient knows to call the clinic with any problems, questions or concerns. No barriers to learning was detected. The total time spent in the appointment was 30 minutes.     [name], MD 11/15/2020   I, Joslyn Devon, am acting as scribe for [name], MD.   I have reviewed the above documentation for accuracy and completeness, and I agree with the above.

## 2020-11-15 ENCOUNTER — Encounter: Payer: Self-pay | Admitting: Hematology

## 2020-11-15 ENCOUNTER — Other Ambulatory Visit: Payer: Self-pay

## 2020-11-15 ENCOUNTER — Telehealth: Payer: Self-pay | Admitting: Hematology

## 2020-11-15 ENCOUNTER — Inpatient Hospital Stay (HOSPITAL_BASED_OUTPATIENT_CLINIC_OR_DEPARTMENT_OTHER): Payer: BC Managed Care – PPO | Admitting: Hematology

## 2020-11-15 VITALS — BP 116/75 | HR 82 | Temp 98.0°F | Resp 16 | Ht 69.0 in | Wt 150.4 lb

## 2020-11-15 DIAGNOSIS — C19 Malignant neoplasm of rectosigmoid junction: Secondary | ICD-10-CM

## 2020-11-15 NOTE — Telephone Encounter (Signed)
Scheduled appointments per 2/17 los. Spoke to patient who is aware of appointments date and times.  °

## 2020-11-26 ENCOUNTER — Other Ambulatory Visit: Payer: Self-pay

## 2020-11-26 ENCOUNTER — Telehealth: Payer: Self-pay | Admitting: Hematology

## 2020-11-26 DIAGNOSIS — C19 Malignant neoplasm of rectosigmoid junction: Secondary | ICD-10-CM

## 2020-11-26 NOTE — Telephone Encounter (Signed)
Scheduled appt per 2/28 sch msg - pt is aware of apt date and time

## 2020-11-27 ENCOUNTER — Encounter: Payer: BC Managed Care – PPO | Admitting: Genetic Counselor

## 2020-11-29 ENCOUNTER — Encounter (HOSPITAL_COMMUNITY): Payer: Self-pay

## 2020-11-29 ENCOUNTER — Emergency Department (HOSPITAL_COMMUNITY)
Admission: EM | Admit: 2020-11-29 | Discharge: 2020-11-29 | Disposition: A | Discharge status: Home / Self Care | Payer: Medicaid Other | Attending: Emergency Medicine | Admitting: Emergency Medicine

## 2020-11-29 ENCOUNTER — Emergency Department (HOSPITAL_COMMUNITY)
Admission: EM | Admit: 2020-11-29 | Discharge: 2020-11-29 | Disposition: A | Discharge status: Home / Self Care | Payer: Medicaid Other | Source: Home / Self Care | Attending: Emergency Medicine | Admitting: Emergency Medicine

## 2020-11-29 ENCOUNTER — Emergency Department (HOSPITAL_COMMUNITY): Payer: Medicaid Other

## 2020-11-29 ENCOUNTER — Other Ambulatory Visit: Payer: Self-pay

## 2020-11-29 DIAGNOSIS — Z87891 Personal history of nicotine dependence: Secondary | ICD-10-CM | POA: Insufficient documentation

## 2020-11-29 DIAGNOSIS — Z85038 Personal history of other malignant neoplasm of large intestine: Secondary | ICD-10-CM | POA: Insufficient documentation

## 2020-11-29 DIAGNOSIS — S0083XA Contusion of other part of head, initial encounter: Secondary | ICD-10-CM | POA: Diagnosis not present

## 2020-11-29 DIAGNOSIS — W182XXA Fall in (into) shower or empty bathtub, initial encounter: Secondary | ICD-10-CM | POA: Insufficient documentation

## 2020-11-29 DIAGNOSIS — Z5321 Procedure and treatment not carried out due to patient leaving prior to being seen by health care provider: Secondary | ICD-10-CM | POA: Insufficient documentation

## 2020-11-29 DIAGNOSIS — S0990XA Unspecified injury of head, initial encounter: Secondary | ICD-10-CM

## 2020-11-29 MED ORDER — ACETAMINOPHEN 500 MG PO TABS
1000.0000 mg | ORAL_TABLET | Freq: Once | ORAL | Status: AC
  Administered 2020-11-29: 1000 mg via ORAL
  Filled 2020-11-29: qty 2

## 2020-11-29 NOTE — ED Notes (Signed)
Pt verbalized understanding of d/c, and follow up care. Ambulatory with steady gait.

## 2020-11-29 NOTE — ED Notes (Signed)
Pt returned to room  

## 2020-11-29 NOTE — ED Triage Notes (Signed)
Pt sts a fall tonight striking head on ground. Small bump on back of head. Denies LOC or anticoagulants. Pt sts chemo tx for colon ca 1 month ago.

## 2020-11-29 NOTE — Discharge Instructions (Addendum)
You were evaluated in the Emergency Department and after careful evaluation, we did not find any emergent condition requiring admission or further testing in the hospital.  Your exam/testing today was overall reassuring.  CT scan did not show any significant injuries.  Please return to the Emergency Department if you experience any worsening of your condition.  Thank you for allowing [place] to be a part of your care.

## 2020-11-29 NOTE — ED Notes (Signed)
Pt ambulated to restroom with steady gait.

## 2020-11-29 NOTE — ED Triage Notes (Signed)
Pt reports that she fell in the shower and hit her head, no LOC, small hematoma to back of head. Neuro intact

## 2020-11-29 NOTE — ED Notes (Signed)
Pt left due to not being seen quick enough as well as wanting to be closer to the cancer center

## 2020-11-29 NOTE — ED Provider Notes (Signed)
[facility] Emergency Department Provider Note MRN:  [id number]  Arrival date & time: 11/29/20     Chief Complaint   Fall   History of Present Illness   Stephanie [name] is a 39 y.o. year-old female with a history of colon cancer presenting to the ED with chief complaint of fall.  Patient slipped and fell in the shower and landed on the back of her head.  Denies loss of consciousness.  Explains that her vision blacked out briefly but then came back.  Denies any numbness or weakness to the arms or legs, no neck pain, no back pain, no chest pain or shortness of breath, no abdominal pain.  The back of the head is sore, mild in severity.  Review of Systems  A complete 10 system review of systems was obtained and all systems are negative except as noted in the HPI and PMH.   Patient's Health History    Past Medical History:  Diagnosis Date  . BV (bacterial vaginosis)   . Cancer ([place])   . Lactose intolerance 03/12/2020  . UTI (lower urinary tract infection)     Past Surgical History:  Procedure Laterality Date  . CESAREAN SECTION    . CYSTOSCOPY WITH STENT PLACEMENT  04/04/2020   Procedure: CYSTOSCOPY WITH STENT PLACEMENT;  Surgeon: [name], MD;  Location: [facility];  Service: General;;  . LAPAROTOMY N/A 04/04/2020   Procedure: EXPLORATORY LAPAROTOMY;  Surgeon: [name], MD;  Location: [facility];  Service: General;  Laterality: N/A;  . PORTACATH PLACEMENT Right 04/12/2020   Procedure: INSERTION PORT-A-CATH WITH ULTRASOUND;  Surgeon: Kieth Brightly Arta Bruce, MD;  Location: [facility];  Service: General;  Laterality: Right;    Family History  Problem Relation Age of Onset  . Heart disease Father   . Colon cancer Neg Hx   . Esophageal cancer Neg Hx     Social History   Socioeconomic History  . Marital status: Single    Spouse name: Not on file  . Number of children: Not on file  . Years of education: Not on file  . Highest education level: Not on file   Occupational History  . Not on file  Tobacco Use  . Smoking status: Former Smoker    Packs/day: 0.50    Years: 10.00    Pack years: 5.00    Types: Cigarettes  . Smokeless tobacco: Never Used  Vaping Use  . Vaping Use: Never used  Substance and Sexual Activity  . Alcohol use: Yes    Comment: seldom  . Drug use: No  . Sexual activity: Yes    Birth control/protection: None, Other-see comments    Comment: nuva ring  Other Topics Concern  . Not on file  Social History Narrative  . Not on file   Social Determinants of Health   Financial Resource Strain: Not on file  Food Insecurity: Not on file  Transportation Needs: Not on file  Physical Activity: Not on file  Stress: Not on file  Social Connections: Not on file  Intimate Partner Violence: Not on file     Physical Exam   Vitals:   11/29/20 0113 11/29/20 0215  BP: (!) 123/96 117/77  Pulse: 77 60  Resp: 16 18  Temp: 97.7 F (36.5 C)   SpO2: 97% 99%    CONSTITUTIONAL: Well-appearing, NAD NEURO:  Alert and oriented x 3, no focal deficits EYES:  eyes equal and reactive ENT/NECK:  no LAD, no JVD CARDIO: Regular rate, well-perfused,  normal S1 and S2 PULM:  CTAB no wheezing or rhonchi GI/GU:  normal bowel sounds, non-distended, non-tender MSK/SPINE:  No gross deformities, no edema SKIN:  no rash, atraumatic PSYCH:  Appropriate speech and behavior  *Additional and/or pertinent findings included in MDM below  Diagnostic and Interventional Summary    EKG Interpretation  Date/Time:    Ventricular Rate:    PR Interval:    QRS Duration:   QT Interval:    QTC Calculation:   R Axis:     Text Interpretation:        Labs Reviewed - No data to display  CT HEAD WO CONTRAST  Final Result      Medications - No data to display   Procedures  /  Critical Care Procedures  ED Course and Medical Decision Making  I have reviewed the triage vital signs, the nursing notes, and pertinent available records from the  EMR.  Listed above are laboratory and imaging tests that I personally ordered, reviewed, and interpreted and then considered in my medical decision making (see below for details).  Shared decision-making regarding CT imaging, patient elects to undergo imaging which is reassuring.  No other injuries, neurologically reassuring exam, appropriate for discharge.  Counseled on possible concussive symptoms, advised mental physical rest.       Barth Kirks. [place]onia Small, [place] [email or web address]  Final Clinical Impressions(s) / ED Diagnoses     ICD-10-CM   1. Injury of head, initial encounter  S09.90XA     ED Discharge Orders    None       Discharge Instructions Discussed with and Provided to Patient:     Discharge Instructions     You were evaluated in the Emergency Department and after careful evaluation, we did not find any emergent condition requiring admission or further testing in the hospital.  Your exam/testing today was overall reassuring.  CT scan did not show any significant injuries.  Please return to the Emergency Department if you experience any worsening of your condition.  Thank you for allowing [place] to be a part of your care.       Maudie Flakes, MD 11/29/20 [phone]

## 2020-12-03 ENCOUNTER — Telehealth: Payer: Self-pay

## 2020-12-03 NOTE — Telephone Encounter (Signed)
Pt left vm stating she thinks she might have a lingering UTI, she has lower back discomfort and some dysuria.  I left a vm for her to return my call.

## 2020-12-04 ENCOUNTER — Inpatient Hospital Stay: Payer: Medicaid Other | Attending: Nurse Practitioner | Admitting: Genetic Counselor

## 2020-12-04 ENCOUNTER — Other Ambulatory Visit: Payer: Self-pay | Admitting: Genetic Counselor

## 2020-12-04 ENCOUNTER — Other Ambulatory Visit: Payer: Self-pay

## 2020-12-04 DIAGNOSIS — R300 Dysuria: Secondary | ICD-10-CM

## 2020-12-04 DIAGNOSIS — Z8249 Family history of ischemic heart disease and other diseases of the circulatory system: Secondary | ICD-10-CM | POA: Insufficient documentation

## 2020-12-04 DIAGNOSIS — Z9221 Personal history of antineoplastic chemotherapy: Secondary | ICD-10-CM | POA: Insufficient documentation

## 2020-12-04 DIAGNOSIS — C19 Malignant neoplasm of rectosigmoid junction: Secondary | ICD-10-CM

## 2020-12-04 DIAGNOSIS — Z8052 Family history of malignant neoplasm of bladder: Secondary | ICD-10-CM | POA: Diagnosis not present

## 2020-12-04 DIAGNOSIS — Z87891 Personal history of nicotine dependence: Secondary | ICD-10-CM | POA: Insufficient documentation

## 2020-12-04 DIAGNOSIS — Z85048 Personal history of other malignant neoplasm of rectum, rectosigmoid junction, and anus: Secondary | ICD-10-CM | POA: Insufficient documentation

## 2020-12-05 ENCOUNTER — Inpatient Hospital Stay: Payer: Medicaid Other

## 2020-12-05 ENCOUNTER — Other Ambulatory Visit: Payer: Self-pay

## 2020-12-05 ENCOUNTER — Encounter: Payer: Self-pay | Admitting: Genetic Counselor

## 2020-12-05 DIAGNOSIS — Z8052 Family history of malignant neoplasm of bladder: Secondary | ICD-10-CM | POA: Insufficient documentation

## 2020-12-05 DIAGNOSIS — Z8249 Family history of ischemic heart disease and other diseases of the circulatory system: Secondary | ICD-10-CM | POA: Diagnosis not present

## 2020-12-05 DIAGNOSIS — Z9221 Personal history of antineoplastic chemotherapy: Secondary | ICD-10-CM | POA: Diagnosis not present

## 2020-12-05 DIAGNOSIS — R300 Dysuria: Secondary | ICD-10-CM

## 2020-12-05 DIAGNOSIS — Z87891 Personal history of nicotine dependence: Secondary | ICD-10-CM | POA: Diagnosis not present

## 2020-12-05 DIAGNOSIS — Z85048 Personal history of other malignant neoplasm of rectum, rectosigmoid junction, and anus: Secondary | ICD-10-CM | POA: Diagnosis present

## 2020-12-05 LAB — URINALYSIS, COMPLETE (UACMP) WITH MICROSCOPIC
Bilirubin Urine: NEGATIVE
Glucose, UA: NEGATIVE mg/dL
Hgb urine dipstick: NEGATIVE
Ketones, ur: NEGATIVE mg/dL
Nitrite: NEGATIVE
Protein, ur: NEGATIVE mg/dL
Specific Gravity, Urine: 1.017 (ref 1.005–1.030)
pH: 7 (ref 5.0–8.0)

## 2020-12-05 NOTE — Progress Notes (Signed)
REFERRING PROVIDER: [name], MD [place],  [place] [address]  PRIMARY PROVIDER:  Patient, [name]  PRIMARY REASON FOR VISIT:  1. 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 Family history of bladder cancer      I connected with Stephanie [name] on 12/04/2020 at 11:00 am EDT by video conference and verified that I am speaking with the correct person using two identifiers.   Patient location: Home Provider location: [place] Office  HISTORY OF PRESENT ILLNESS:   Stephanie [name], a 40 y.o. female, was seen for a [place] cancer genetics consultation at the request of Dr. Burr Medico due to a personal and family history of cancer.  Stephanie [name] presents to clinic today to discuss the possibility of a hereditary predisposition to cancer, genetic testing, and to further clarify her future cancer risks, as well as potential cancer risks for family members.   In 2021, at the age of 89, Stephanie [name] was diagnosed with colorectal cancer of the rectosigmoid junction. The treatment plan has included surgery (completed 04/04/2020) and chemotherapy. The tumor had proficient MMR protein expression.   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 Redemonstrated hypoenhancing lesion of the posterior liver dome, hepatic segment VII, reduced in size compared to prior examination, measuring 1.3 x 1.3 cm, previously 1.8 x 1.7 cm. Findings are consistent with treatment response of a biopsy proven metastasis. No other evidence of lymphadenopathy or metastatic disease within the abdomen or pelvis. 2. Unchanged mild splenomegaly, maximum coronal span  14.0 cm. 3. 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 Overall findings are highly concerning for colorectal carcinoma involving the sigmoid colon with an associated perforation and adjacent abscess and phlegmon formation as detailed above. Currently, no collection is amenable to percutaneous drainage given their small size and location. 2. New 2 cm mass in the right hepatic lobe concerning for metastatic disease to the liver until proven otherwise. 3. Enlarged regional lymph nodes as detailed above is concerning for nodal metastatic disease. 4. Large stool burden. 5. Prominent pelvic veins which can be seen in patients with pelvic congestion syndrome.   03/13/2020 Imaging   ABD US IMPRESSION: Approximately 2.1 x 2.4 x 2.0 cm lobular homogeneously echogenic lesion in the right hepatic dome corresponds with the abnormality seen on the prior CT scan. Sonographically, this appearance is highly suggestive of a benign hemangioma.   Recommend MRI of the abdomen with gadolinium contrast which may provide a noninvasive diagnosis of benign hemangioma.    03/13/2020 Imaging   MR ABD W/WO CONTRAST Hepatobiliary: Diffuse low signal intensity throughout the hepatic parenchyma on T2 weighted images, presumably a consequence of recent Feraheme injection. In segment 7 of the liver (axial image 8 of series 5) there is a 2.5 x 1.9 cm well-defined lesion which is slightly T2 hyperintense. This lesion appears hyperintense on pre gadolinium T1 weighted images (likely a consequence of Feraheme). Interpretation of enhancement within the lesion is compromised by presence of Feraheme. No other hepatic lesions are  confidently identified on today's examination. No intra or extrahepatic biliary ductal dilatation. Gallbladder is normal in  appearance.   03/31/2020 Imaging   CT AP W contrast IMPRESSION: 1. Previously noted sigmoid colon mass appears increased in size, and again appears to be associated with a focal contained perforation which crosses the midline and has fistulized into the left adnexal region where there is now what appears to be a large left tubo-ovarian abscess, as detailed above. This is also associated with multiple enlarged lymph nodes in the pelvis measuring up to 1.2 cm in short axis and borderline enlarged retroperitoneal lymph nodes, concerning for metastatic disease. In addition, previously suspected metastatic lesion in segment 7 of the liver has enlarged. 2. Small volume of ascites. 3. Additional incidental findings, as above.   04/04/2020 Cancer Staging   Staging form: Colon and Rectum, AJCC 8th Edition - Pathologic stage from 04/04/2020: pT4a, pN1b, cM1 - Signed by [name], NP on 05/07/2020   04/04/2020 Procedure   Paracentesis, path showed no malignant cells (mixed acute and chronic inflammation present)   04/04/2020 Surgery   Open sigmoid colectomy and end colostomy by Dr. [name]   04/04/2020 Pathology Results   FINAL MICROSCOPIC DIAGNOSIS: A. COLON, RECTOSIGMOID, RESECTION: - Invasive moderately differentiated adenocarcinoma, 6 cm, involving rectosigmoid junction - Carcinoma invades into serosal surface with perforation and associated serositis - Radial resection margin is positive for carcinoma; proximal and distal margins are not involved - Lymphovascular invasion is present - Metastatic carcinoma to one of fifteen lymph nodes (1/15); one tumor deposit - See oncology table B. LYMPH NODES, MESENTERIC, RESECTION: - Metastatic adenocarcinoma to one of six lymph nodes (1/6) - One tumor deposit  Addendum to note 2 involved lymph nodes (of 21 examined nodes) pT4a,pN1b MMR-normal, preserved expression of MLH1, MSH2, MSH6, PMS2   04/12/2020 Procedure   PAC placement    04/13/2020  Imaging   CT chest without contrast IMPRESSION: Interval development of bilateral pleural effusions, left slightly greater than right, with resultant bibasilar atelectasis including subtotal collapse of the left lower lobe. No evidence of intrathoracic metastatic disease, though evaluation of the collapsed parenchyma is limited. Hepatic metastasis again demonstrated.     04/16/2020 Pathology Results   FINAL MICROSCOPIC DIAGNOSIS:  A. LIVER, RIGHT LOBE, BIOPSY:  - Adenocarcinoma.  COMMENT:  The morphology is compatible with the provided clinical history of colorectal carcinoma.    05/23/2020 - 10/24/2020 Chemotherapy   FOLFIRINOX q2weeks for 3-6 months starting 05/23/20. Bevacizumab-bvzr [name] added with C2. Oxaliplatin held with C11-12 due to reaction. (pt developed SOB, chest palpitation and abdominal discomfort shortly after oxaliplatin started). Completed on 10/24/20.   08/05/2020 Imaging   CT AP  IMPRESSION: 1. Postsurgical changes of distal colectomy with a left lower quadrant end ostomy. No evidence of obstruction or acute complication at this time. Excluded rectal pouch in the deep pelvis without acute complication or worrisome features. 2. Slight interval decrease in size of a hypoattenuating lesion posterior right lobe liver measuring 1.7 x 1.8 x 2 cm. This lesion has previously undergone ultrasound-guided biopsy with pathologic results demonstrating adenocarcinoma compatible with metastatic disease from patient's resected colorectal carcinoma. 3. Slight prominence of the parametrial vessels bilaterally, nonspecific though can be seen in the setting of pelvic congestion syndrome. 4. Mild splenomegaly.  No focal lesion.     11/12/2020 Imaging   CT Chest  IMPRESSION: 1. No evidence of metastatic disease in the chest. 2. Known segment 7 right liver 1.3 cm metastasis, stable since recent  11/09/2020 MRI.     RISK FACTORS:  Menarche was at age 21.  First live birth at  age 28.  OCP use for approximately less than 1 year.  Ovaries intact: yes.  Hysterectomy: no.  Menopausal status: unsure (periods stopped from chemotherapy).  HRT use: 0 years. Mammogram within the last year: no. Number of breast biopsies: 0.   Past Medical History:  Diagnosis Date  . BV (bacterial vaginosis)   . Cancer ([place])   . Family history of bladder cancer   . Lactose intolerance 03/12/2020  . UTI (lower urinary tract infection)     Past Surgical History:  Procedure Laterality Date  . CESAREAN SECTION    . CYSTOSCOPY WITH STENT PLACEMENT  04/04/2020   Procedure: CYSTOSCOPY WITH STENT PLACEMENT;  Surgeon: [name], MD;  Location: [facility];  Service: General;;  . LAPAROTOMY N/A 04/04/2020   Procedure: EXPLORATORY LAPAROTOMY;  Surgeon: [name], MD;  Location: [facility];  Service: General;  Laterality: N/A;  . PORTACATH PLACEMENT Right 04/12/2020   Procedure: INSERTION PORT-A-CATH WITH ULTRASOUND;  Surgeon: Kieth Brightly Arta Bruce, MD;  Location: [facility];  Service: General;  Laterality: Right;    Social History   Socioeconomic History  . Marital status: Single    Spouse name: Not on file  . Number of children: Not on file  . Years of education: Not on file  . Highest education level: Not on file  Occupational History  . Not on file  Tobacco Use  . Smoking status: Former Smoker    Packs/day: 0.50    Years: 10.00    Pack years: 5.00    Types: Cigarettes  . Smokeless tobacco: Never Used  Vaping Use  . Vaping Use: Never used  Substance and Sexual Activity  . Alcohol use: Yes    Comment: seldom  . Drug use: No  . Sexual activity: Yes    Birth control/protection: None, Other-see comments    Comment: nuva ring  Other Topics Concern  . Not on file  Social History Narrative  . Not on file   Social Determinants of Health   Financial Resource Strain: Not on file  Food Insecurity: Not on file  Transportation Needs: Not on file  Physical Activity: Not on file   Stress: Not on file  Social Connections: Not on file     FAMILY HISTORY:  We obtained a detailed, 4-generation family history.  Significant diagnoses are listed below: Family History  Problem Relation Age of Onset  . Heart disease Father   . Stroke Father   . Aneurysm Father   . Bladder Cancer Maternal Grandmother        dx 90s  . Colon cancer Neg Hx   . Esophageal cancer Neg Hx    Stephanie [name] has one daughter (age 27). She has one full-brother (age 72), one full-sister (age 64), one paternal half-brother (age 110), and one paternal half-sister (age 18). None of these relatives have had cancer.  Stephanie [name] mother is alive at age 31 without cancer. There are three maternal uncles. There is no known cancer among maternal aunts/uncles or maternal cousins. Stephanie [name] maternal grandmother is in her 88s and has a history of bladder cancer diagnosed in her 53s. Her maternal grandfather died in his 45s without cancer.   Stephanie [name] father died at age 80 without cancer. There are two paternal aunts and one paternal uncle. There is no known cancer among paternal aunts/uncles or paternal cousins. Stephanie [name] paternal grandmother died  in her 25s without cancer. She does not have information about her paternal grandfather.  Stephanie [name] is unaware of previous family history of genetic testing for hereditary cancer risks. Patient's ancestors are of [place] and [place] descent. There is no reported Ashkenazi Jewish ancestry. There is no known consanguinity.  GENETIC COUNSELING ASSESSMENT: Stephanie [name] is a 40 y.o. female with a personal history of young-onset colon cancer and a family history of bladder cancer, which is somewhat suggestive of a hereditary cancer syndrome and predisposition to cancer. We, therefore, discussed and recommended the following at today's visit.   DISCUSSION: We discussed that approximately 5-10% of cancer is hereditary, with most cases of hereditary  colorectal cancer associated with Lynch syndrome. There are other genes that can be associated with hereditary colorectal cancer syndromes. These include APC, MUTYH, CHEK2, etc. We discussed that testing is beneficial for several reasons, including knowing about other cancer risks, identifying potential screening and risk-reduction options that may be appropriate, and to understand if other family members could be at risk for cancer and allow them to undergo genetic testing.  We reviewed the characteristics, features and inheritance patterns of hereditary cancer syndromes. We also discussed genetic testing, including the appropriate family members to test, the process of testing, insurance coverage and turn-around-time for results. We discussed the implications of a negative, positive and/or variant of uncertain significant result. We recommended Stephanie [name] pursue genetic testing for the [facility] CustomNext-Cancer + RNAinsight gene panel.   The CustomNext-Cancer+RNAinsight panel offered by [facility] includes sequencing and rearrangement analysis for the following 47 genes:  APC, ATM, AXIN2, BARD1, BMPR1A, BRCA1, BRCA2, BRIP1, CDH1, CDK4, CDKN2A, CHEK2, DICER1, EPCAM, GREM1, HOXB13, MEN1, MLH1, MSH2, MSH3, MSH6, MUTYH, NBN, NF1, NF2, NTHL1, PALB2, PMS2, POLD1, POLE, PTEN, RAD51C, RAD51D, RECQL, RET, SDHA, SDHAF2, SDHB, SDHC, SDHD, SMAD4, SMARCA4, STK11, TP53, TSC1, TSC2, and VHL.  RNA data is routinely analyzed for use in variant interpretation for all genes.  Based on Stephanie [name]'s personal history of cancer, she meets medical criteria for genetic testing. Despite that she meets criteria, there may still be an out of pocket cost. We discussed that if her out of pocket cost for testing is over $100, the laboratory will reach out to let her know. If the out of pocket cost of testing is less than $100 she will be billed by the genetic testing laboratory.   PLAN: After considering the risks, benefits, and  limitations, Stephanie [name] provided informed consent to pursue genetic testing and the blood sample was sent to [facility] for analysis of the CustomNext-Cancer + RNAinsight panel. Results should be available within approximately two-three weeks' time, at which point they will be disclosed by telephone to Stephanie [name], as will any additional recommendations warranted by these results. Stephanie [name] will receive a summary of her genetic counseling visit and a copy of her results once available. This information will also be available in Epic.   Stephanie [name] questions were answered to her satisfaction today. Our contact information was provided should additional questions or concerns arise. Thank you for the referral and allowing [place] to share in the care of your patient.   Clint Guy, [place], [facility] Licensed, Certified Dispensing [email or web address] Phone: [phone]  The patient was seen for a total of 40 minutes in face-to-face genetic counseling.  This patient was discussed with Drs. Magrinat, Lindi Adie and/or Burr Medico who agrees with the above.    _______________________________________________________________________ For Office Staff:  Number of people involved in session: 2 Was  an Intern/ student involved with case: yes

## 2020-12-06 LAB — URINE CULTURE: Culture: NO GROWTH

## 2020-12-07 ENCOUNTER — Encounter (HOSPITAL_COMMUNITY)
Admission: RE | Admit: 2020-12-07 | Discharge: 2020-12-07 | Disposition: A | Discharge status: Home / Self Care | Payer: Medicaid Other | Source: Ambulatory Visit | Attending: General Surgery | Admitting: General Surgery

## 2020-12-07 ENCOUNTER — Encounter (HOSPITAL_COMMUNITY): Payer: Self-pay

## 2020-12-07 ENCOUNTER — Other Ambulatory Visit: Payer: Self-pay

## 2020-12-07 DIAGNOSIS — Z01812 Encounter for preprocedural laboratory examination: Secondary | ICD-10-CM | POA: Diagnosis not present

## 2020-12-07 LAB — COMPREHENSIVE METABOLIC PANEL
ALT: 23 U/L (ref 0–44)
AST: 19 U/L (ref 15–41)
Albumin: 3.9 g/dL (ref 3.5–5.0)
Alkaline Phosphatase: 72 U/L (ref 38–126)
Anion gap: 7 (ref 5–15)
BUN: 11 mg/dL (ref 6–20)
CO2: 26 mmol/L (ref 22–32)
Calcium: 9.3 mg/dL (ref 8.9–10.3)
Chloride: 106 mmol/L (ref 98–111)
Creatinine, Ser: 0.64 mg/dL (ref 0.44–1.00)
GFR, Estimated: >60 mL/min (ref >60)
Glucose, Bld: 81 mg/dL (ref 70–99)
Potassium: 3.8 mmol/L (ref 3.5–5.1)
Sodium: 139 mmol/L (ref 135–145)
Total Bilirubin: 0.7 mg/dL (ref 0.3–1.2)
Total Protein: 7 g/dL (ref 6.5–8.1)

## 2020-12-07 LAB — CBC WITH DIFFERENTIAL/PLATELET
Abs Immature Granulocytes: 0.02 10*3/uL (ref 0.00–0.07)
Basophils Absolute: 0 10*3/uL (ref 0.0–0.1)
Basophils Relative: 0 %
Eosinophils Absolute: 0.2 10*3/uL (ref 0.0–0.5)
Eosinophils Relative: 2 %
HCT: 45.6 % (ref 36.0–46.0)
Hemoglobin: 14.7 g/dL (ref 12.0–15.0)
Immature Granulocytes: 0 %
Lymphocytes Relative: 19 %
Lymphs Abs: 1.3 10*3/uL (ref 0.7–4.0)
MCH: 32.2 pg (ref 26.0–34.0)
MCHC: 32.2 g/dL (ref 30.0–36.0)
MCV: 99.8 fL (ref 80.0–100.0)
Monocytes Absolute: 0.5 10*3/uL (ref 0.1–1.0)
Monocytes Relative: 7 %
Neutro Abs: 4.8 10*3/uL (ref 1.7–7.7)
Neutrophils Relative %: 72 %
Platelets: 111 10*3/uL — ABNORMAL LOW (ref 150–400)
RBC: 4.57 MIL/uL (ref 3.87–5.11)
RDW: 12.6 % (ref 11.5–15.5)
WBC: 6.8 10*3/uL (ref 4.0–10.5)
nRBC: 0 % (ref 0.0–0.2)

## 2020-12-07 LAB — PREPARE RBC (CROSSMATCH)

## 2020-12-07 NOTE — Progress Notes (Signed)
PCP: Denies Cardiologist: Denies Oncologist: [name], MD  EKG: 03/11/20 CXR: na ECHO: denies Stress Test: denies Cardiac Cath: denies  Fasting Blood Sugar- na Checks Blood Sugar_na__ times a day  OSA/CPAP: No  ASA/Blood Thinners: No  Covid test 12/10/20  Anesthesia Review: No  Patient denies shortness of breath, fever, cough, and chest pain at PAT appointment.  Patient verbalized understanding of instructions provided today at the PAT appointment.  Patient asked to review instructions at home and day of surgery.

## 2020-12-07 NOTE — Pre-Procedure Instructions (Signed)
NEIMA LACROSS  12/07/2020    Your procedure is scheduled on Thursday, December 13, 2020 at 9:30 AM.   Report to [facility] Entrance "A" Admitting Office at 7:30 AM.   Call this number if you have problems the morning of surgery: [phone]   Questions prior to day of surgery, please call [phone] between 8 & 4 PM.   Remember:  Do not eat food after midnight Wednesday, 12/12/20.  You may drink clear liquids until 6:30 AM.  Clear liquids allowed are: Water, Juice (non-citric and without pulp - diabetics please choose diet or no sugar options), Carbonated beverages - (diabetics please choose diet or no sugar options), Clear Tea, Black Coffee only (no creamer, milk or cream including half and half) and Gatorade (diabetics please choose diet or no sugar options)    Take these medicines the morning of surgery with A SIP OF WATER: Valacyclovir (Valtrex), Acetaminophen (Tylenol) - if needed, Dicyclomine (Bentyl) - if needed, Lorazepam (Ativan) - if needed, Pantoprazole (Protonix) - if needed, Tramadol - if needed  Stop NSAIDS (Voltaren, Diclofenac, Ibuprofen, Aleve, etc) as of today prior to surgery. Do not use Aspirin containing products, Vitamins, Herbal medications or Fish Oil prior to surgery.    Do not wear jewelry, make-up or nail polish.  Do not wear lotions, powders, perfumes or deodorant.  Do not shave 48 hours prior to surgery.    Do not bring valuables to the hospital.  [facility] is not responsible for any belongings or valuables.  Contacts, dentures or bridgework may not be worn into surgery.  Leave your suitcase in the car.  After surgery it may be brought to your room.  For patients admitted to the hospital, discharge time will be determined by your treatment team.  [facility] - Preparing for Surgery  Before surgery, you can play an important role.  Because skin is not sterile, your skin needs to be as free of germs as possible.  You can reduce the number of  germs on you skin by washing with CHG (chlorahexidine gluconate) soap before surgery.  CHG is an antiseptic cleaner which kills germs and bonds with the skin to continue killing germs even after washing.  Oral Hygiene is also important in reducing the risk of infection.  Remember to brush your teeth with your regular toothpaste the morning of surgery.  Please DO NOT use if you have an allergy to CHG or antibacterial soaps.  If your skin becomes reddened/irritated stop using the CHG and inform your nurse when you arrive at Short Stay.  Do not shave (including legs and underarms) for at least 48 hours prior to the first CHG shower.  You may shave your face.  Please follow these instructions carefully:   1.  Shower with CHG Soap the night before surgery and the morning of Surgery.  2.  If you choose to wash your hair, wash your hair first as usual with your normal shampoo.  3.  After you shampoo, rinse your hair and body thoroughly to remove the shampoo. 4.  Use CHG as you would any other liquid soap.  You can apply chg directly to the skin and wash gently with a      scrungie or washcloth.           5.  Apply the CHG Soap to your body ONLY FROM THE NECK DOWN.   Do not use on open wounds or open sores. Avoid contact with your eyes, ears, mouth and  genitals (private parts).  Wash genitals (private parts) with your normal soap, do this prior to using CHG soap.  6.  Wash thoroughly, paying special attention to the area where your surgery will be performed.  7.  Thoroughly rinse your body with warm water from the neck down.  8.  DO NOT shower/wash with your normal soap after using and rinsing off the CHG Soap.  9.  Pat yourself dry with a clean towel.            10.  Wear clean pajamas.            11.  Place clean sheets on your bed the night of your first shower and do not sleep with pets.  Day of Surgery  Shower as above. Do not apply any lotions/deodorants the morning of surgery.   Please wear  clean clothes to the hospital. Remember to brush your teeth with toothpaste.  Please read over the fact sheets that you were given.

## 2020-12-10 ENCOUNTER — Telehealth: Payer: Self-pay | Admitting: *Deleted

## 2020-12-10 ENCOUNTER — Other Ambulatory Visit (HOSPITAL_COMMUNITY): Payer: Medicaid Other

## 2020-12-10 ENCOUNTER — Other Ambulatory Visit (HOSPITAL_COMMUNITY)
Admission: RE | Admit: 2020-12-10 | Discharge: 2020-12-10 | Disposition: A | Discharge status: Home / Self Care | Payer: Medicaid Other | Source: Ambulatory Visit | Attending: General Surgery | Admitting: General Surgery

## 2020-12-10 DIAGNOSIS — Z01812 Encounter for preprocedural laboratory examination: Secondary | ICD-10-CM | POA: Diagnosis not present

## 2020-12-10 DIAGNOSIS — Z20822 Contact with and (suspected) exposure to covid-19: Secondary | ICD-10-CM | POA: Insufficient documentation

## 2020-12-10 LAB — SARS CORONAVIRUS 2 (TAT 6-24 HRS): SARS Coronavirus 2: NEGATIVE

## 2020-12-10 NOTE — H&P (Signed)
Stephanie [name] Location: [facility] Surgery Patient #: [id number] DOB: 1981-03-28 Single / Language: [name] / Race: White Female   History of Present Illness  The patient is a 40 year old female who presents for a follow-up for Colorectal cancer. Pt is a 40 yo F who came to medical attention 03/12/2020. It was felt that she had contained perforation with phlegmon. There was concern for malignancy with this admission. She was discharged with hope that she would improved adequately for an outpatient colonoscopy, however, she got readmitted 03/31/2020. It looked worse at that point and she was taken to the OR 04/04/2020. She had open sigmoid colectomy with ostomy and wound left open. She also had a liver mass that looked like it might be benign on imaging, but has since been biopsied and is positive for metastatic disease. The patient got a port while in house. She was discharged 04/16/2020.   She has subsequently completed 12 rounds of chemotherapy (Dr. Burr Medico) She has been able to get disability which has helped her financial issues. She has tolerated chemo well for the most part except for reaction to oxaliplatin during cycle 11. Restaging scans 2/11 showed continued decrease in the size of the mass. CEA 2.81. 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   CT chest 11/10/2019 IMPRESSION: 1. No evidence of metastatic disease in the chest. 2. 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  MRI abd/pelvis 11/10/2019 IMPRESSION: 1. Redemonstrated hypoenhancing lesion of the posterior liver dome, hepatic segment VII, reduced in size compared to prior examination, measuring 1.3 x 1.3 cm, previously 1.8 x 1.7 cm. Findings are consistent with treatment response of a biopsy proven metastasis. No other evidence of lymphadenopathy or metastatic disease within the abdomen or pelvis. 2. Unchanged mild splenomegaly, maximum coronal span 14.0  cm. 3. 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    pathology 04/04/2020 A. COLON, RECTOSIGMOID, RESECTION: - Invasive moderately differentiated adenocarcinoma, 6 cm, involving rectosigmoid junction - Carcinoma invades into serosal surface with perforation and associated serositis - Radial resection margin is positive for carcinoma; proximal and distal margins are not involved - Lymphovascular invasion is present - Metastatic carcinoma to one of fifteen lymph nodes (1/15); one tumor deposit - See oncology table  B. LYMPH NODES, MESENTERIC, RESECTION: - Metastatic adenocarcinoma to one of six lymph nodes (1/6) - One tumor deposit   Allergies  No Known Drug Allergies   Allergies Reconciled   Medication History Prochlorperazine Maleate (10MG  Tablet, Oral) Active. Lidocaine-Prilocaine (2.5-2.5% Cream, External) Active. FeroSul (325 (65 Fe)MG Tablet, Oral) Active. Polyethylene Glycol 3350 (17GM Packet, Oral) Active. LORazepam (0.5MG  Tablet, Oral) Active. Ondansetron HCl (8MG  Tablet, Oral) Active. valACYclovir HCl (500MG  Tablet, Oral) Active. Dicyclomine HCl (10MG  Capsule, Oral) Active. Potassium Chloride Crys ER (20MEQ Tablet ER, Oral) Active. Pantoprazole Sodium (40MG  Tablet DR, Oral) Active. Medications Reconciled    Review of Systems  All other systems negative   Physical Exam  General Mental Status-Alert. General Appearance-Consistent with stated age. Hydration-Well hydrated. Voice-Normal.  Head and Neck Head-normocephalic, atraumatic with no lesions or palpable masses.  Eye Sclera/Conjunctiva - Bilateral-No scleral icterus.  Chest and Lung Exam Chest and lung exam reveals -quiet, even and easy respiratory effort with no use of accessory muscles. Inspection Chest Wall - Normal. Back - normal.  Breast - Did not examine.  Cardiovascular Cardiovascular examination reveals -normal pedal pulses  bilaterally. Note: regular rate and rhythm  Abdomen Inspection-Inspection  Normal. Palpation/Percussion Palpation and Percussion of the abdomen reveal - Soft, Non Tender, No Rebound tenderness, No Rigidity (guarding) and No hepatosplenomegaly. Note: ostomy in place.   Peripheral Vascular Upper Extremity Inspection - Bilateral - Normal - No Clubbing, No Cyanosis, No Edema, Pulses Intact. Lower Extremity Palpation - Edema - Bilateral - No edema - Bilateral.  Neurologic Neurologic evaluation reveals -alert and oriented x 3 with no impairment of recent or remote memory. Mental Status-Normal.  Musculoskeletal Global Assessment -Note: no gross deformities.  Normal Exam - Left-Upper Extremity Strength Normal and Lower Extremity Strength Normal. Normal Exam - Right-Upper Extremity Strength Normal and Lower Extremity Strength Normal.  Lymphatic Head & Neck  General Head & Neck Lymphatics: Bilateral - Description - Normal. Axillary  General Axillary Region: Bilateral - Description - Normal. Tenderness - Non Tender.    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  Scans show continued decrease in size.  Reviewed surgery and risks.  The surgery was discussed with the patient with diagrams of anatomy. I reviewed the rationale for surgery, possible alternative options, possibility of having to abort the procedure, hospital course, post op restrictions, possible post op complications, possible need for post hospital stay at a nursing home or rehab, and possible death.  The complications can include: This is a very extensive operation and includes complications listed below: Bleeding Infection and possible wound complications such as hernia Damage to adjacent structures Leak of bile from the surface of the liver Possible need for other procedures, such as abscess drains in radiology or endoscopy. Possible prolonged  hospital stay MOST PATIENTS' ENERGY LEVEL IS NOT BACK TO NORMAL FOR AT LEAST 4-6 MONTHS. OLDER PATIENTS MAY FEEL WEAK FOR LONGER PERIODS OF TIME. Difficulty with eating or post operative nausea (around 30%) Possible early recurrence of cancer Possible complications of your medical problems such as heart disease or arrhythmias. Death (less than 2%) Current Plans You are being scheduled for surgery- Our schedulers will call you.  You should hear from our office's scheduling department within 5 working days about the location, date, and time of surgery. We try to make accommodations for patient's preferences in scheduling surgery, but sometimes the OR schedule or the surgeon's schedule prevents [place] from making those accommodations.  If you have not heard from our office [phone]) in 5 working days, call the office and ask for your surgeon's nurse.  If you have other questions about your diagnosis, plan, or surgery, call the office and ask for your surgeon's nurse.  Pt Education - flb hepatectomy: discussed with patient and provided information.

## 2020-12-10 NOTE — Telephone Encounter (Signed)
Notified of message below

## 2020-12-10 NOTE — Telephone Encounter (Signed)
-----   Message from [name], MD sent at 12/09/2020  1:54 PM EDT ----- Let pt know her urine culture was negative. Thanks   [name]  12/09/2020

## 2020-12-12 NOTE — Anesthesia Preprocedure Evaluation (Addendum)
Anesthesia Evaluation  Patient identified by MRN, date of birth, ID band Patient awake    Reviewed: Allergy & Precautions, NPO status , Patient's Chart, lab work & pertinent test results  Airway Mallampati: II  TM Distance: >3 FB Neck ROM: Full    Dental  (+) Teeth Intact, Dental Advisory Given   Pulmonary former smoker,    Pulmonary exam normal breath sounds clear to auscultation       Cardiovascular Exercise Tolerance: Good negative cardio ROS Normal cardiovascular exam Rhythm:Regular Rate:Normal     Neuro/Psych PSYCHIATRIC DISORDERS Anxiety negative neurological ROS     GI/Hepatic GERD  Medicated,Malignant pancreatic cancer  Colon cancer s/p resection and chemo   Endo/Other  negative endocrine ROS  Renal/GU negative Renal ROS     Musculoskeletal negative musculoskeletal ROS (+)   Abdominal   Peds  Hematology  (+) Blood dyscrasia (Thrombocytopenia--Plt 111k), ,   Anesthesia Other Findings   Reproductive/Obstetrics                            Anesthesia Physical Anesthesia Plan  ASA: IV  Anesthesia Plan: General and Epidural   Post-op Pain Management:    Induction:   PONV Risk Score and Plan: 4 or greater and Midazolam, Dexamethasone, Ondansetron, Scopolamine patch - Pre-op and Propofol infusion  Airway Management Planned: Oral ETT  Additional Equipment: Arterial line  Intra-op Plan:   Post-operative Plan: Possible Post-op intubation/ventilation  Informed Consent: I have reviewed the patients History and Physical, chart, labs and discussed the procedure including the risks, benefits and alternatives for the proposed anesthesia with the patient or authorized representative who has indicated his/her understanding and acceptance.     Dental advisory given  Plan Discussed with: CRNA  Anesthesia Plan Comments: (2 LARGE BORE PIV, or CVL if unable to obtain adequate  peripheral access.)       Anesthesia Quick Evaluation

## 2020-12-13 ENCOUNTER — Other Ambulatory Visit: Payer: Self-pay

## 2020-12-13 ENCOUNTER — Inpatient Hospital Stay (HOSPITAL_COMMUNITY)
Admission: RE | Admit: 2020-12-13 | Discharge: 2020-12-22 | DRG: 406 | Disposition: A | Discharge status: Home / Self Care | Payer: Medicaid Other | Attending: General Surgery | Admitting: General Surgery

## 2020-12-13 ENCOUNTER — Encounter (HOSPITAL_COMMUNITY): Payer: Self-pay | Admitting: General Surgery

## 2020-12-13 ENCOUNTER — Inpatient Hospital Stay (HOSPITAL_COMMUNITY): Payer: Medicaid Other | Admitting: Certified Registered Nurse Anesthetist

## 2020-12-13 ENCOUNTER — Encounter (HOSPITAL_COMMUNITY)
Admission: RE | Disposition: A | Discharge status: Home / Self Care | Payer: Self-pay | Source: Home / Self Care | Attending: General Surgery

## 2020-12-13 DIAGNOSIS — Z85048 Personal history of other malignant neoplasm of rectum, rectosigmoid junction, and anus: Secondary | ICD-10-CM | POA: Diagnosis not present

## 2020-12-13 DIAGNOSIS — C787 Secondary malignant neoplasm of liver and intrahepatic bile duct: Secondary | ICD-10-CM | POA: Diagnosis not present

## 2020-12-13 DIAGNOSIS — D6481 Anemia due to antineoplastic chemotherapy: Secondary | ICD-10-CM | POA: Diagnosis not present

## 2020-12-13 DIAGNOSIS — Z933 Colostomy status: Secondary | ICD-10-CM

## 2020-12-13 DIAGNOSIS — C189 Malignant neoplasm of colon, unspecified: Secondary | ICD-10-CM | POA: Diagnosis present

## 2020-12-13 DIAGNOSIS — E440 Moderate protein-calorie malnutrition: Secondary | ICD-10-CM | POA: Diagnosis present

## 2020-12-13 DIAGNOSIS — T451X5A Adverse effect of antineoplastic and immunosuppressive drugs, initial encounter: Secondary | ICD-10-CM | POA: Diagnosis not present

## 2020-12-13 DIAGNOSIS — G629 Polyneuropathy, unspecified: Secondary | ICD-10-CM | POA: Diagnosis not present

## 2020-12-13 DIAGNOSIS — C772 Secondary and unspecified malignant neoplasm of intra-abdominal lymph nodes: Secondary | ICD-10-CM | POA: Diagnosis not present

## 2020-12-13 DIAGNOSIS — Z9221 Personal history of antineoplastic chemotherapy: Secondary | ICD-10-CM | POA: Diagnosis not present

## 2020-12-13 DIAGNOSIS — D6959 Other secondary thrombocytopenia: Secondary | ICD-10-CM | POA: Diagnosis not present

## 2020-12-13 DIAGNOSIS — D62 Acute posthemorrhagic anemia: Secondary | ICD-10-CM | POA: Diagnosis not present

## 2020-12-13 DIAGNOSIS — D638 Anemia in other chronic diseases classified elsewhere: Secondary | ICD-10-CM | POA: Diagnosis not present

## 2020-12-13 DIAGNOSIS — Z6822 Body mass index (BMI) 22.0-22.9, adult: Secondary | ICD-10-CM | POA: Diagnosis not present

## 2020-12-13 DIAGNOSIS — M79601 Pain in right arm: Secondary | ICD-10-CM | POA: Diagnosis not present

## 2020-12-13 DIAGNOSIS — I959 Hypotension, unspecified: Secondary | ICD-10-CM | POA: Diagnosis not present

## 2020-12-13 DIAGNOSIS — J90 Pleural effusion, not elsewhere classified: Secondary | ICD-10-CM

## 2020-12-13 HISTORY — PX: CHOLECYSTECTOMY: SHX55

## 2020-12-13 HISTORY — PX: LAPAROSCOPIC LIVER ULTRASOUND: SHX5902

## 2020-12-13 HISTORY — PX: OPEN PARTIAL HEPATECTOMY [83]: SHX5987

## 2020-12-13 HISTORY — PX: LAPAROSCOPY: SHX197

## 2020-12-13 LAB — PLATELET COUNT: Platelets: 91 10*3/uL — ABNORMAL LOW (ref 150–400)

## 2020-12-13 LAB — MRSA PCR SCREENING: MRSA by PCR: NEGATIVE

## 2020-12-13 SURGERY — HEPATECTOMY, PARTIAL, OPEN
Anesthesia: Epidural | Site: Abdomen

## 2020-12-13 MED ORDER — CEFAZOLIN SODIUM-DEXTROSE 2-4 GM/100ML-% IV SOLN
2.0000 g | Freq: Three times a day (TID) | INTRAVENOUS | Status: AC
  Administered 2020-12-13: 2 g via INTRAVENOUS
  Filled 2020-12-13: qty 100

## 2020-12-13 MED ORDER — DICLOFENAC SODIUM 1 % EX GEL
2.0000 g | Freq: Four times a day (QID) | CUTANEOUS | Status: DC | PRN
  Filled 2020-12-13: qty 100

## 2020-12-13 MED ORDER — HYDROMORPHONE HCL 1 MG/ML IJ SOLN
0.5000 mg | INTRAMUSCULAR | Status: DC | PRN
  Administered 2020-12-13: 1 mg via INTRAVENOUS
  Administered 2020-12-13: 0.5 mg via INTRAVENOUS
  Administered 2020-12-13 – 2020-12-22 (×64): 1 mg via INTRAVENOUS
  Filled 2020-12-13 (×66): qty 1

## 2020-12-13 MED ORDER — KETAMINE HCL 50 MG/5ML IJ SOSY
PREFILLED_SYRINGE | INTRAMUSCULAR | Status: AC
  Filled 2020-12-13: qty 5

## 2020-12-13 MED ORDER — CHLORHEXIDINE GLUCONATE CLOTH 2 % EX PADS
6.0000 | MEDICATED_PAD | Freq: Every day | CUTANEOUS | Status: DC
  Administered 2020-12-14 – 2020-12-22 (×8): 6 via TOPICAL

## 2020-12-13 MED ORDER — LIDOCAINE 2% (20 MG/ML) 5 ML SYRINGE
INTRAMUSCULAR | Status: AC
  Filled 2020-12-13: qty 5

## 2020-12-13 MED ORDER — LACTATED RINGERS IV SOLN: INTRAVENOUS | Status: DC | PRN

## 2020-12-13 MED ORDER — PHENYLEPHRINE HCL (PRESSORS) 10 MG/ML IV SOLN: INTRAVENOUS | Status: DC | PRN

## 2020-12-13 MED ORDER — PROCHLORPERAZINE EDISYLATE 10 MG/2ML IJ SOLN: 5.0000 mg | Freq: Four times a day (QID) | INTRAMUSCULAR | Status: DC | PRN

## 2020-12-13 MED ORDER — PHENYLEPHRINE 40 MCG/ML (10ML) SYRINGE FOR IV PUSH (FOR BLOOD PRESSURE SUPPORT)
PREFILLED_SYRINGE | INTRAVENOUS | Status: AC
  Filled 2020-12-13: qty 10

## 2020-12-13 MED ORDER — VISTASEAL 10 ML SINGLE DOSE KIT
10.0000 mL | PACK | Freq: Once | CUTANEOUS | Status: DC
  Filled 2020-12-13: qty 10

## 2020-12-13 MED ORDER — ARTIFICIAL TEARS OPHTHALMIC OINT
TOPICAL_OINTMENT | OPHTHALMIC | Status: AC
  Filled 2020-12-13: qty 3.5

## 2020-12-13 MED ORDER — KETAMINE HCL 10 MG/ML IJ SOLN
INTRAMUSCULAR | Status: DC | PRN
  Administered 2020-12-13: 20 mg via INTRAVENOUS
  Administered 2020-12-13: 10 mg via INTRAVENOUS
  Administered 2020-12-13: 20 mg via INTRAVENOUS

## 2020-12-13 MED ORDER — METHOCARBAMOL 1000 MG/10ML IJ SOLN
500.0000 mg | Freq: Three times a day (TID) | INTRAVENOUS | Status: DC | PRN
  Filled 2020-12-13: qty 5

## 2020-12-13 MED ORDER — FENTANYL CITRATE (PF) 250 MCG/5ML IJ SOLN
INTRAMUSCULAR | Status: AC
  Filled 2020-12-13: qty 5

## 2020-12-13 MED ORDER — LACTATED RINGERS IV SOLN: INTRAVENOUS | Status: DC

## 2020-12-13 MED ORDER — FENTANYL CITRATE (PF) 100 MCG/2ML IJ SOLN
INTRAMUSCULAR | Status: DC | PRN
  Administered 2020-12-13: 100 ug via INTRAVENOUS
  Administered 2020-12-13: 50 ug via INTRAVENOUS

## 2020-12-13 MED ORDER — DIPHENHYDRAMINE HCL 12.5 MG/5ML PO ELIX
12.5000 mg | ORAL_SOLUTION | Freq: Four times a day (QID) | ORAL | Status: DC | PRN
  Administered 2020-12-14 – 2020-12-15 (×2): 12.5 mg via ORAL
  Filled 2020-12-13 (×3): qty 5

## 2020-12-13 MED ORDER — FENTANYL CITRATE (PF) 100 MCG/2ML IJ SOLN
INTRAMUSCULAR | Status: AC
  Filled 2020-12-13: qty 2

## 2020-12-13 MED ORDER — LIDOCAINE-EPINEPHRINE 1 %-1:100000 IJ SOLN
INTRAMUSCULAR | Status: AC
  Filled 2020-12-13: qty 1

## 2020-12-13 MED ORDER — CHLORHEXIDINE GLUCONATE CLOTH 2 % EX PADS: 6.0000 | MEDICATED_PAD | Freq: Once | CUTANEOUS | Status: DC

## 2020-12-13 MED ORDER — HEMOSTATIC AGENTS (NO CHARGE) OPTIME
TOPICAL | Status: DC | PRN
  Administered 2020-12-13: 1 via TOPICAL

## 2020-12-13 MED ORDER — ARTIFICIAL TEARS OPHTHALMIC OINT
TOPICAL_OINTMENT | OPHTHALMIC | Status: DC | PRN
  Administered 2020-12-13: 1 via OPHTHALMIC

## 2020-12-13 MED ORDER — MIDAZOLAM HCL 2 MG/2ML IJ SOLN
1.0000 mg | Freq: Once | INTRAMUSCULAR | Status: AC
  Administered 2020-12-13: 1 mg via INTRAVENOUS

## 2020-12-13 MED ORDER — SIMETHICONE 80 MG PO CHEW: 40.0000 mg | CHEWABLE_TABLET | Freq: Four times a day (QID) | ORAL | Status: DC | PRN

## 2020-12-13 MED ORDER — ONDANSETRON 4 MG PO TBDP
4.0000 mg | ORAL_TABLET | Freq: Four times a day (QID) | ORAL | Status: DC | PRN
  Administered 2020-12-14: 4 mg via ORAL
  Filled 2020-12-13: qty 1

## 2020-12-13 MED ORDER — LIDOCAINE-EPINEPHRINE (PF) 1.5 %-1:200000 IJ SOLN
INTRAMUSCULAR | Status: DC | PRN
  Administered 2020-12-13: 2 mL via EPIDURAL
  Administered 2020-12-13: 5 mL via EPIDURAL
  Administered 2020-12-13: 3 mL via EPIDURAL

## 2020-12-13 MED ORDER — LIDOCAINE-EPINEPHRINE (PF) 1.5 %-1:200000 IJ SOLN: INTRAMUSCULAR | Status: DC | PRN

## 2020-12-13 MED ORDER — MIDAZOLAM HCL 5 MG/5ML IJ SOLN
INTRAMUSCULAR | Status: DC | PRN
  Administered 2020-12-13: 2 mg via INTRAVENOUS

## 2020-12-13 MED ORDER — PANTOPRAZOLE SODIUM 40 MG PO TBEC: 40.0000 mg | DELAYED_RELEASE_TABLET | Freq: Every day | ORAL | Status: DC

## 2020-12-13 MED ORDER — VASOPRESSIN 20 UNIT/ML IV SOLN
INTRAVENOUS | Status: AC
  Filled 2020-12-13: qty 1

## 2020-12-13 MED ORDER — MELATONIN 3 MG PO TABS
3.0000 mg | ORAL_TABLET | Freq: Every evening | ORAL | Status: DC | PRN
  Filled 2020-12-13: qty 1

## 2020-12-13 MED ORDER — SUGAMMADEX SODIUM 200 MG/2ML IV SOLN
INTRAVENOUS | Status: DC | PRN
  Administered 2020-12-13: 200 mg via INTRAVENOUS

## 2020-12-13 MED ORDER — DEXAMETHASONE SODIUM PHOSPHATE 10 MG/ML IJ SOLN
INTRAMUSCULAR | Status: DC | PRN
  Administered 2020-12-13: 10 mg via INTRAVENOUS

## 2020-12-13 MED ORDER — FENTANYL CITRATE (PF) 100 MCG/2ML IJ SOLN
25.0000 ug | INTRAMUSCULAR | Status: DC | PRN
  Administered 2020-12-13 (×2): 25 ug via INTRAVENOUS

## 2020-12-13 MED ORDER — CEFAZOLIN SODIUM-DEXTROSE 2-4 GM/100ML-% IV SOLN
2.0000 g | INTRAVENOUS | Status: AC
  Administered 2020-12-13: 2 g via INTRAVENOUS
  Filled 2020-12-13: qty 100

## 2020-12-13 MED ORDER — LIDOCAINE HCL 1 % IJ SOLN
INTRAMUSCULAR | Status: DC | PRN
  Administered 2020-12-13: 2 mL

## 2020-12-13 MED ORDER — KCL-LACTATED RINGERS-D5W 20 MEQ/L IV SOLN
INTRAVENOUS | Status: DC
  Filled 2020-12-13 (×10): qty 1000

## 2020-12-13 MED ORDER — ACETAMINOPHEN 325 MG PO TABS
650.0000 mg | ORAL_TABLET | Freq: Four times a day (QID) | ORAL | Status: DC | PRN
  Administered 2020-12-13: 650 mg via ORAL
  Filled 2020-12-13: qty 2

## 2020-12-13 MED ORDER — ORAL CARE MOUTH RINSE: 15.0000 mL | Freq: Once | OROMUCOSAL | Status: AC

## 2020-12-13 MED ORDER — ALBUMIN HUMAN 5 % IV SOLN
25.0000 g | Freq: Once | INTRAVENOUS | Status: AC
  Administered 2020-12-13: 25 g via INTRAVENOUS
  Filled 2020-12-13: qty 500

## 2020-12-13 MED ORDER — 0.9 % SODIUM CHLORIDE (POUR BTL) OPTIME
TOPICAL | Status: DC | PRN
  Administered 2020-12-13 (×2): 2000 mL

## 2020-12-13 MED ORDER — STERILE WATER FOR IRRIGATION IR SOLN
Status: DC | PRN
  Administered 2020-12-13: 2000 mL

## 2020-12-13 MED ORDER — LORAZEPAM 0.5 MG PO TABS
0.5000 mg | ORAL_TABLET | Freq: Two times a day (BID) | ORAL | Status: DC | PRN
  Administered 2020-12-15 – 2020-12-21 (×2): 0.5 mg via ORAL
  Filled 2020-12-13 (×2): qty 1

## 2020-12-13 MED ORDER — LIDOCAINE 2% (20 MG/ML) 5 ML SYRINGE
INTRAMUSCULAR | Status: DC | PRN
  Administered 2020-12-13: 80 mg via INTRAVENOUS

## 2020-12-13 MED ORDER — ALBUMIN HUMAN 5 % IV SOLN: INTRAVENOUS | Status: DC | PRN

## 2020-12-13 MED ORDER — PHENYLEPHRINE HCL-NACL 10-0.9 MG/250ML-% IV SOLN
INTRAVENOUS | Status: DC | PRN
  Administered 2020-12-13: 50 ug/min via INTRAVENOUS

## 2020-12-13 MED ORDER — VALACYCLOVIR HCL 500 MG PO TABS
500.0000 mg | ORAL_TABLET | Freq: Two times a day (BID) | ORAL | Status: DC
  Administered 2020-12-14 – 2020-12-22 (×11): 500 mg via ORAL
  Filled 2020-12-13 (×19): qty 1

## 2020-12-13 MED ORDER — ONDANSETRON HCL 4 MG/2ML IJ SOLN
4.0000 mg | Freq: Four times a day (QID) | INTRAMUSCULAR | Status: DC | PRN
  Administered 2020-12-13 – 2020-12-19 (×3): 4 mg via INTRAVENOUS
  Filled 2020-12-13 (×3): qty 2

## 2020-12-13 MED ORDER — MIDAZOLAM HCL 2 MG/2ML IJ SOLN
INTRAMUSCULAR | Status: AC
  Filled 2020-12-13: qty 2

## 2020-12-13 MED ORDER — ROPIVACAINE HCL 2 MG/ML IJ SOLN
10.0000 mL/h | INTRAMUSCULAR | Status: DC
  Administered 2020-12-13: 8 mL/h via EPIDURAL
  Administered 2020-12-14 – 2020-12-16 (×4): 10 mL/h via EPIDURAL
  Filled 2020-12-13 (×10): qty 200

## 2020-12-13 MED ORDER — ONDANSETRON HCL 4 MG/2ML IJ SOLN
INTRAMUSCULAR | Status: AC
  Filled 2020-12-13: qty 2

## 2020-12-13 MED ORDER — ACETAMINOPHEN 650 MG RE SUPP: 650.0000 mg | Freq: Four times a day (QID) | RECTAL | Status: DC | PRN

## 2020-12-13 MED ORDER — CHLORHEXIDINE GLUCONATE 0.12 % MT SOLN
15.0000 mL | Freq: Once | OROMUCOSAL | Status: AC
  Administered 2020-12-13: 15 mL via OROMUCOSAL
  Filled 2020-12-13: qty 15

## 2020-12-13 MED ORDER — ONDANSETRON HCL 4 MG/2ML IJ SOLN: 4.0000 mg | Freq: Once | INTRAMUSCULAR | Status: DC | PRN

## 2020-12-13 MED ORDER — MIDAZOLAM HCL 2 MG/2ML IJ SOLN
INTRAMUSCULAR | Status: AC
  Administered 2020-12-13: 1 mg via INTRAVENOUS
  Filled 2020-12-13: qty 2

## 2020-12-13 MED ORDER — ROCURONIUM BROMIDE 10 MG/ML (PF) SYRINGE
PREFILLED_SYRINGE | INTRAVENOUS | Status: AC
  Filled 2020-12-13: qty 10

## 2020-12-13 MED ORDER — PROCHLORPERAZINE MALEATE 10 MG PO TABS
10.0000 mg | ORAL_TABLET | Freq: Four times a day (QID) | ORAL | Status: DC | PRN
  Filled 2020-12-13: qty 1

## 2020-12-13 MED ORDER — POLYETHYLENE GLYCOL 3350 17 G PO PACK
17.0000 g | PACK | Freq: Two times a day (BID) | ORAL | Status: DC | PRN
  Administered 2020-12-14: 17 g via ORAL
  Filled 2020-12-13: qty 1

## 2020-12-13 MED ORDER — ONDANSETRON HCL 4 MG/2ML IJ SOLN
INTRAMUSCULAR | Status: DC | PRN
  Administered 2020-12-13: 4 mg via INTRAVENOUS

## 2020-12-13 MED ORDER — BUPIVACAINE HCL (PF) 0.25 % IJ SOLN
INTRAMUSCULAR | Status: AC
  Filled 2020-12-13: qty 30

## 2020-12-13 MED ORDER — GABAPENTIN 100 MG PO CAPS
100.0000 mg | ORAL_CAPSULE | ORAL | Status: AC
  Administered 2020-12-13: 100 mg via ORAL
  Filled 2020-12-13: qty 1

## 2020-12-13 MED ORDER — LIDOCAINE-PRILOCAINE 2.5-2.5 % EX CREA
1.0000 "application " | TOPICAL_CREAM | CUTANEOUS | Status: DC | PRN
  Filled 2020-12-13: qty 5

## 2020-12-13 MED ORDER — PHENYLEPHRINE HCL (PRESSORS) 10 MG/ML IV SOLN
INTRAVENOUS | Status: DC | PRN
  Administered 2020-12-13 (×5): 80 ug via INTRAVENOUS

## 2020-12-13 MED ORDER — SODIUM CHLORIDE (PF) 0.9 % IJ SOLN
INTRAMUSCULAR | Status: AC
  Filled 2020-12-13: qty 20

## 2020-12-13 MED ORDER — DIPHENHYDRAMINE HCL 50 MG/ML IJ SOLN: 12.5000 mg | Freq: Four times a day (QID) | INTRAMUSCULAR | Status: DC | PRN

## 2020-12-13 MED ORDER — MIDAZOLAM HCL 2 MG/2ML IJ SOLN: 1.0000 mg | Freq: Once | INTRAMUSCULAR | Status: AC

## 2020-12-13 MED ORDER — PROPOFOL 10 MG/ML IV BOLUS
INTRAVENOUS | Status: DC | PRN
  Administered 2020-12-13: 150 mg via INTRAVENOUS
  Administered 2020-12-13: 20 mg via INTRAVENOUS

## 2020-12-13 MED ORDER — ROCURONIUM BROMIDE 10 MG/ML (PF) SYRINGE
PREFILLED_SYRINGE | INTRAVENOUS | Status: DC | PRN
  Administered 2020-12-13: 20 mg via INTRAVENOUS
  Administered 2020-12-13: 70 mg via INTRAVENOUS
  Administered 2020-12-13: 30 mg via INTRAVENOUS

## 2020-12-13 MED ORDER — CELECOXIB 200 MG PO CAPS
200.0000 mg | ORAL_CAPSULE | Freq: Two times a day (BID) | ORAL | Status: DC
  Administered 2020-12-13 – 2020-12-22 (×18): 200 mg via ORAL
  Filled 2020-12-13 (×19): qty 1

## 2020-12-13 MED ORDER — TRAMADOL HCL 50 MG PO TABS
50.0000 mg | ORAL_TABLET | Freq: Two times a day (BID) | ORAL | Status: DC | PRN
  Administered 2020-12-13 – 2020-12-16 (×4): 50 mg via ORAL
  Filled 2020-12-13 (×4): qty 1

## 2020-12-13 SURGICAL SUPPLY — 105 items
ADH SKN CLS APL DERMABOND .7 (GAUZE/BANDAGES/DRESSINGS) ×1
APL PRP STRL LF DISP 70% ISPRP (MISCELLANEOUS) ×1
BAG BILE T-TUBES STRL (MISCELLANEOUS) ×3 IMPLANT
BAG DRN 9.5 2 ADJ BELT ADPR (MISCELLANEOUS) ×1
BIOPATCH RED 1 DISK 7.0 (GAUZE/BANDAGES/DRESSINGS) ×2 IMPLANT
BIOPATCH RED 1IN DISK 7.0MM (GAUZE/BANDAGES/DRESSINGS) ×1
BLADE CLIPPER SURG (BLADE) IMPLANT
BLADE SURG 11 STRL SS (BLADE) ×3 IMPLANT
BOOT SUTURE AID YELLOW STND (SUTURE) IMPLANT
CANISTER SUCT 3000ML PPV (MISCELLANEOUS) ×3 IMPLANT
CATH KIT ON Q 7.5IN SLV (PAIN MANAGEMENT) IMPLANT
CATH KIT ON-Q SILVERSOAK 5 (CATHETERS) ×2 IMPLANT
CATH KIT ON-Q SILVERSOAK 5IN (CATHETERS) ×6 IMPLANT
CHLORAPREP W/TINT 26 (MISCELLANEOUS) ×3 IMPLANT
CLIP VESOCCLUDE LG 6/CT (CLIP) ×9 IMPLANT
CLIP VESOCCLUDE MED 24/CT (CLIP) ×3 IMPLANT
CLIP VESOCCLUDE MED 6/CT (CLIP) ×3 IMPLANT
CLIP VESOLOCK LG 6/CT PURPLE (CLIP) ×3 IMPLANT
CLIP VESOLOCK MED 6/CT (CLIP) ×3 IMPLANT
CLIP VESOLOCK MED LG 6/CT (CLIP) ×5 IMPLANT
COUNTER NEEDLE 20 DBL MAG RED (NEEDLE) IMPLANT
COVER SURGICAL LIGHT HANDLE (MISCELLANEOUS) ×3 IMPLANT
COVER WAND RF STERILE (DRAPES) ×3 IMPLANT
DECANTER SPIKE VIAL GLASS SM (MISCELLANEOUS) ×6 IMPLANT
DERMABOND ADVANCED (GAUZE/BANDAGES/DRESSINGS) ×2
DERMABOND ADVANCED .7 DNX12 (GAUZE/BANDAGES/DRESSINGS) ×1 IMPLANT
DRAIN CHANNEL 19F RND (DRAIN) ×3 IMPLANT
DRAPE LAPAROSCOPIC ABDOMINAL (DRAPES) ×3 IMPLANT
DRAPE WARM FLUID 44X44 (DRAPES) ×6 IMPLANT
DRSG COVADERM 4X10 (GAUZE/BANDAGES/DRESSINGS) IMPLANT
DRSG COVADERM 4X14 (GAUZE/BANDAGES/DRESSINGS) ×2 IMPLANT
DRSG TEGADERM 4X4.75 (GAUZE/BANDAGES/DRESSINGS) ×3 IMPLANT
ELECT BLADE 4.0 EZ CLEAN MEGAD (MISCELLANEOUS) ×3
ELECT BLADE 6.5 EXT (BLADE) ×3 IMPLANT
ELECT PAD DSPR THERM+ ADLT (MISCELLANEOUS) ×3 IMPLANT
ELECT REM PT RETURN 9FT ADLT (ELECTROSURGICAL) ×3
ELECTRODE BLDE 4.0 EZ CLN MEGD (MISCELLANEOUS) ×1 IMPLANT
ELECTRODE REM PT RTRN 9FT ADLT (ELECTROSURGICAL) ×1 IMPLANT
EVACUATOR SILICONE 100CC (DRAIN) ×3 IMPLANT
GAUZE 4X4 16PLY RFD (DISPOSABLE) IMPLANT
GAUZE SPONGE 2X2 8PLY STRL LF (GAUZE/BANDAGES/DRESSINGS) IMPLANT
GEL ULTRASOUND 20GR AQUASONIC (MISCELLANEOUS) IMPLANT
GLOVE BIO SURGEON STRL SZ 6 (GLOVE) ×6 IMPLANT
GLOVE SURG UNDER LTX SZ6.5 (GLOVE) ×3 IMPLANT
GOWN STRL REUS W/ TWL LRG LVL3 (GOWN DISPOSABLE) ×2 IMPLANT
GOWN STRL REUS W/TWL 2XL LVL3 (GOWN DISPOSABLE) ×6 IMPLANT
GOWN STRL REUS W/TWL LRG LVL3 (GOWN DISPOSABLE) ×6
HAND PENCIL TRP OPTION (MISCELLANEOUS) ×2 IMPLANT
HANDLE SUCTION POOLE (INSTRUMENTS) ×1 IMPLANT
KIT BASIN OR (CUSTOM PROCEDURE TRAY) ×3 IMPLANT
KIT OSTOMY DRAINABLE 2.75 STR (WOUND CARE) ×2 IMPLANT
KIT TURNOVER KIT B (KITS) ×3 IMPLANT
L-HOOK LAP DISP 36CM (ELECTROSURGICAL) ×3
LHOOK LAP DISP 36CM (ELECTROSURGICAL) ×1 IMPLANT
LOOP VESSEL MAXI BLUE (MISCELLANEOUS) IMPLANT
LOOP VESSEL MINI RED (MISCELLANEOUS) IMPLANT
NDL BIOPSY 14X6 SOFT TISS (NEEDLE) IMPLANT
NEEDLE BIOPSY 14X6 SOFT TISS (NEEDLE) IMPLANT
NS IRRIG 1000ML POUR BTL (IV SOLUTION) ×6 IMPLANT
PACK GENERAL/GYN (CUSTOM PROCEDURE TRAY) ×3 IMPLANT
PAD ARMBOARD 7.5X6 YLW CONV (MISCELLANEOUS) ×6 IMPLANT
PENCIL BUTTON HOLSTER BLD 10FT (ELECTRODE) ×3 IMPLANT
PENCIL SMOKE EVACUATOR (MISCELLANEOUS) ×3 IMPLANT
RELOAD STAPLE 60 2.6 WHT THN (STAPLE) IMPLANT
RELOAD STAPLER WHITE 60MM (STAPLE) ×8 IMPLANT
SCISSORS LAP 5X35 DISP (ENDOMECHANICALS) IMPLANT
SET IRRIG TUBING LAPAROSCOPIC (IRRIGATION / IRRIGATOR) IMPLANT
SET TUBE SMOKE EVAC HIGH FLOW (TUBING) ×3 IMPLANT
SHEARS FOC LG CVD HARMONIC 17C (MISCELLANEOUS) ×2 IMPLANT
SLEEVE ENDOPATH XCEL 5M (ENDOMECHANICALS) ×3 IMPLANT
SPONGE GAUZE 2X2 STER 10/PKG (GAUZE/BANDAGES/DRESSINGS) ×2
SPONGE LAP 18X18 RF (DISPOSABLE) ×9 IMPLANT
STAPLE ECHEON FLEX 60 POW ENDO (STAPLE) ×2 IMPLANT
STAPLER ECHELON POWERED (MISCELLANEOUS) ×1 IMPLANT
STAPLER RELOAD WHITE 60MM (STAPLE) ×24
STAPLER VISISTAT 35W (STAPLE) ×3 IMPLANT
SUCTION POOLE HANDLE (INSTRUMENTS) ×3
SUT ETHILON 1 TP 1 60 (SUTURE) ×3 IMPLANT
SUT ETHILON 2 0 FS 18 (SUTURE) ×5 IMPLANT
SUT MNCRL AB 4-0 PS2 18 (SUTURE) ×3 IMPLANT
SUT PDS AB 1 TP1 96 (SUTURE) ×13 IMPLANT
SUT PROLENE 3 0 RB 1 (SUTURE) ×3 IMPLANT
SUT PROLENE 3 0 SH 48 (SUTURE) ×10 IMPLANT
SUT PROLENE 3 0 SH DA (SUTURE) ×3 IMPLANT
SUT PROLENE 4 0 RB 1 (SUTURE) ×6
SUT PROLENE 4 0 SH DA (SUTURE) ×7 IMPLANT
SUT PROLENE 4-0 RB1 .5 CRCL 36 (SUTURE) ×2 IMPLANT
SUT SILK 2 0 (SUTURE) ×3
SUT SILK 2-0 18XBRD TIE 12 (SUTURE) IMPLANT
SUT SILK 3 0 SH CR/8 (SUTURE) ×2 IMPLANT
SUT VIC AB 2-0 CTX 27 (SUTURE) ×10 IMPLANT
SUT VIC AB 2-0 SH 18 (SUTURE) ×3 IMPLANT
SUT VIC AB 3-0 SH 18 (SUTURE) ×3 IMPLANT
SUT VIC AB 3-0 SH 8-18 (SUTURE) IMPLANT
SUT VICRYL AB 2 0 TIES (SUTURE) ×3 IMPLANT
SUT VICRYL AB 3 0 TIES (SUTURE) ×3 IMPLANT
TOWEL GREEN STERILE (TOWEL DISPOSABLE) ×3 IMPLANT
TOWEL GREEN STERILE FF (TOWEL DISPOSABLE) ×3 IMPLANT
TRAY FOLEY MTR SLVR 14FR STAT (SET/KITS/TRAYS/PACK) ×3 IMPLANT
TRAY LAPAROSCOPIC MC (CUSTOM PROCEDURE TRAY) ×3 IMPLANT
TROCAR XCEL BLUNT TIP 100MML (ENDOMECHANICALS) IMPLANT
TROCAR XCEL NON-BLD 11X100MML (ENDOMECHANICALS) IMPLANT
TROCAR XCEL NON-BLD 5MMX100MML (ENDOMECHANICALS) ×3 IMPLANT
TUNNELER SHEATH ON-Q 16GX12 DP (PAIN MANAGEMENT) ×3 IMPLANT
WARMER LAPAROSCOPE (MISCELLANEOUS) ×3 IMPLANT

## 2020-12-13 NOTE — Op Note (Addendum)
PREOPERATIVE DIAGNOSIS:  Colon cancer metastatic to the liver.  POSTOPERATIVE DIAGNOSIS:  Same  PROCEDURE PERFORMED:  Diagnostic laparoscopy, intraoperative liver ultrasound, partial right hepatectomy,   SURGEON:  [name], MD  ASSISTANT:  Michaelle Birks, MD  ANESTHESIA:  General, epidural, and local.  FINDINGS:  Small tumor in posterolateral right lobe. No significant adhesions in lower abdomen.    SPECIMEN:  Partial right hepatectomy and gallbladder to Pathology.  ESTIMATED BLOOD LOSS:  400 mL.  COMPLICATIONS:  Unknown.  PROCEDURE:  The patient was identified in the holding area, taken to the operating room, and placed supine on the operating room table.  General endotracheal anesthesia was induced.  The ostomy appliance was removed.  The ostomy was covered with a raytec and a tegaderm.  The abdomen was then prepped and draped in a sterile fashion.  A time-out was performed according to the surgical safety check list.  When all was correct, we continued.    The left subcostal region was anesthetized with local anesthetic and a small 6 mm incision was made with a #11 blade. Optiview port was placed without difficulty.  Pneumoperitoneum was achieved.  The camera was placed in the abdomen and no evidence of carcinomatosis was seen.  A second port was placed in the prior midline incision and the right liver was examined more carefully.  No evidence of carcinomatosis was seen.  The ports were then removed.    A right subcostal incision was made extending up to the xyphoid.  The subcutaneous tissues were divided with the Bovie and then the fascia was opened with the Bovie as well.  The falciform was clamped, divided, and tied.  This was used for retraction.   The Bookwalter was then placed for assistance with visualization.    The adhesions were taken off the abdominal wall as needed.      Intraoperative ultrasound was then performed.  The tumor was found in the right lobe and  appeared to be 1.1 cm.  This was relatively consistent with prior imaging.   The gallbladder was elevated with a long Kelly clamp.  The cautery was used to take it off the liver.  Hemalock clips were used to clamp the cystic duct.  The cystic artery was divided with the harmonic scalpel.   Adhesions of the right lobe were taken off the diaphragm.   The liver was scored with the Bovie.  The clamp/crush technique and the harmonic scalpel were then used to divide the bulk of the liver parenchyma. Smaller vascular pedicles were clamped with metal clips.  The vascular pedicles were divided with Echelon staple loads. The liver was taken completely off. The tumor was seen in the parenchyma of the liver.  The argon beam coagulator was used to coagulate the liver bed.  The abdomen was irrigated. Pressure was held on the cut surface of the liver.    A clean lap was placed on the liver edge and held for 3 minutes to evaluate for biliary leakage.  Hemoblast was then applied to the liver edge A 19 Blake drain was placed in the abdomen going around the cut edge of the surface of the liver and over into the hepatic fossa.  The Kilkenny drain was then in place with a 2-0 nylon.    The lap count was confirmed to be correct.  The fascia was then closed in 2 layers of the lateral portion of the incision and 1 layer in the midline portion.  The skin was then  irrigated and stapled.    The wounds were cleaned, dried and dressed with Covaderm.  The patient was extubated and taken to PACU in stable condition.     [name], MD

## 2020-12-13 NOTE — Anesthesia Postprocedure Evaluation (Signed)
Anesthesia Post Note  Patient: Stephanie [name]  Procedure(s) Performed: OPEN PARTIAL HEPATECTOMY (N/A Abdomen) LAPAROSCOPY DIAGNOSTIC (N/A Abdomen) INTRAOPERATIVE LIVER ULTRASOUND (N/A Abdomen)     Patient location during evaluation: PACU Anesthesia Type: Epidural and General Level of consciousness: awake and alert Pain management: pain level controlled Vital Signs Assessment: post-procedure vital signs reviewed and stable Respiratory status: spontaneous breathing, nonlabored ventilation, respiratory function stable and patient connected to nasal cannula oxygen Cardiovascular status: blood pressure returned to baseline and stable Postop Assessment: no apparent nausea or vomiting Anesthetic complications: no   No complications documented.  Last Vitals:  Vitals:   12/13/20 1700 12/13/20 1715  BP: 110/68 114/70  Pulse: 67 79  Resp: 16 17  Temp:    SpO2: 100% 100%    Last Pain:  Vitals:   12/13/20 1725  TempSrc:   PainSc: Asleep                 Catalina Gravel

## 2020-12-13 NOTE — Anesthesia Procedure Notes (Signed)
Procedure Name: Intubation Date/Time: 12/13/2020 11:11 AM Performed by: Kathryne Hitch, CRNA Pre-anesthesia Checklist: Patient identified, Emergency Drugs available, Suction available and Patient being monitored Patient Re-evaluated:Patient Re-evaluated prior to induction Oxygen Delivery Method: Circle system utilized Preoxygenation: Pre-oxygenation with 100% oxygen Induction Type: IV induction Ventilation: Mask ventilation without difficulty Laryngoscope Size: Mac and 3 Grade View: Grade I Tube type: Oral Tube size: 7.0 mm Number of attempts: 1 Airway Equipment and Method: Stylet and Oral airway Placement Confirmation: ETT inserted through vocal cords under direct vision,  positive ETCO2 and breath sounds checked- equal and bilateral Secured at: 21 cm Tube secured with: Tape Dental Injury: Teeth and Oropharynx as per pre-operative assessment

## 2020-12-13 NOTE — Plan of Care (Signed)
  Problem: Pain Managment: Goal: General experience of comfort will improve Outcome: Progressing   

## 2020-12-13 NOTE — Transfer of Care (Signed)
Immediate Anesthesia Transfer of Care Note  Patient: Stephanie [name]  Procedure(s) Performed: OPEN PARTIAL HEPATECTOMY (N/A Abdomen) LAPAROSCOPY DIAGNOSTIC (N/A Abdomen) INTRAOPERATIVE LIVER ULTRASOUND (N/A Abdomen)  Patient Location: PACU  Anesthesia Type:General  Level of Consciousness: drowsy and responds to stimulation  Airway & Oxygen Therapy: Patient Spontanous Breathing and Patient connected to face mask oxygen  Post-op Assessment: Report given to RN, Post -op Vital signs reviewed and stable and Patient moving all extremities  Post vital signs: Reviewed and stable  Last Vitals:  Vitals Value Taken Time  BP 96/66 12/13/20 1337  Temp    Pulse 63 12/13/20 1339  Resp 13 12/13/20 1339  SpO2 100 % 12/13/20 1339  Vitals shown include unvalidated device data.  Last Pain:  Vitals:   12/13/20 0855  TempSrc:   PainSc: 0-No pain         Complications: No complications documented.

## 2020-12-13 NOTE — Anesthesia Procedure Notes (Signed)
Arterial Line Insertion Start/End3/17/2022 8:50 AM, 12/13/2020 9:00 AM Performed by: Catalina Gravel, MD, anesthesiologist  Patient location: Pre-op. Preanesthetic checklist: patient identified, IV checked, site marked, risks and benefits discussed, surgical consent, monitors and equipment checked, pre-op evaluation, timeout performed and anesthesia consent Lidocaine 1% used for infiltration Left, radial was placed Catheter size: 20 Fr Hand hygiene performed  and maximum sterile barriers used   Attempts: 1 Procedure performed without using ultrasound guided technique. Following insertion, dressing applied and Biopatch. Post procedure assessment: normal and unchanged  Patient tolerated the procedure well with no immediate complications.

## 2020-12-13 NOTE — Progress Notes (Addendum)
No urine pregnancy test needed per Dr. Gifford Shave

## 2020-12-13 NOTE — Anesthesia Procedure Notes (Signed)
Epidural Patient location during procedure: OB Start time: 12/13/2020 9:40 AM End time: 12/13/2020 9:50 AM  Staffing Anesthesiologist: Catalina Gravel, MD Performed: anesthesiologist   Preanesthetic Checklist Completed: patient identified, IV checked, risks and benefits discussed, monitors and equipment checked, pre-op evaluation and timeout performed  Epidural Patient position: sitting Prep: DuraPrep Patient monitoring: blood pressure and continuous pulse ox Approach: midline Location: thoracic (1-12) (T7-8) Injection technique: LOR air  Needle:  Needle type: Tuohy  Needle gauge: 17 G Needle length: 9 cm Catheter size: 19 Gauge Catheter at skin depth: 10 cm Test dose: negative, Other and 1.5% lidocaine with Epi 1:200 K (1% Lidocaine)  Additional Notes Patient identified.  Risk benefits discussed including failed block, incomplete pain control, headache, nerve damage, paralysis, blood pressure changes, nausea, vomiting, reactions to medication both toxic or allergic, and postpartum back pain.  Patient expressed understanding and wished to proceed.  All questions were answered.  Sterile technique used throughout procedure and epidural site dressed with sterile barrier dressing. No paresthesia or other complications noted. The patient did not experience any signs of intravascular injection such as tinnitus or metallic taste in mouth nor signs of intrathecal spread such as rapid motor block. Please see nursing notes for vital signs. Reason for block:procedure for pain

## 2020-12-13 NOTE — Interval H&P Note (Signed)
History and Physical Interval Note:  12/13/2020 9:43 AM  Stephanie [name]  has presented today for surgery, with the diagnosis of COLON CANCER.  The various methods of treatment have been discussed with the patient and family. After consideration of risks, benefits and other options for treatment, the patient has consented to  Procedure(s) with comments: OPEN PARTIAL HEPATECTOMY (N/A) - LAPAROSCOPY DIAGNOSTIC (N/A), INTRAOPERATIVE LIVER ULTRASOUND (N/A) as a surgical intervention.  The patient's history has been reviewed, patient examined, no change in status, stable for surgery.  I have reviewed the patient's chart and labs.  Questions were answered to the patient's satisfaction.     [name]

## 2020-12-14 ENCOUNTER — Encounter (HOSPITAL_COMMUNITY): Payer: Self-pay | Admitting: General Surgery

## 2020-12-14 ENCOUNTER — Encounter (HOSPITAL_COMMUNITY): Payer: Self-pay | Admitting: Anesthesiology

## 2020-12-14 LAB — COMPREHENSIVE METABOLIC PANEL
ALT: 276 U/L — ABNORMAL HIGH (ref 0–44)
AST: 229 U/L — ABNORMAL HIGH (ref 15–41)
Albumin: 3.3 g/dL — ABNORMAL LOW (ref 3.5–5.0)
Alkaline Phosphatase: 31 U/L — ABNORMAL LOW (ref 38–126)
Anion gap: 4 — ABNORMAL LOW (ref 5–15)
BUN: 10 mg/dL (ref 6–20)
CO2: 25 mmol/L (ref 22–32)
Calcium: 8 mg/dL — ABNORMAL LOW (ref 8.9–10.3)
Chloride: 107 mmol/L (ref 98–111)
Creatinine, Ser: 0.63 mg/dL (ref 0.44–1.00)
GFR, Estimated: >60 mL/min (ref >60)
Glucose, Bld: 124 mg/dL — ABNORMAL HIGH (ref 70–99)
Potassium: 4.5 mmol/L (ref 3.5–5.1)
Sodium: 136 mmol/L (ref 135–145)
Total Bilirubin: 1.1 mg/dL (ref 0.3–1.2)
Total Protein: 4.9 g/dL — ABNORMAL LOW (ref 6.5–8.1)

## 2020-12-14 LAB — CBC
HCT: 25.6 % — ABNORMAL LOW (ref 36.0–46.0)
Hemoglobin: 8.4 g/dL — ABNORMAL LOW (ref 12.0–15.0)
MCH: 32.7 pg (ref 26.0–34.0)
MCHC: 32.8 g/dL (ref 30.0–36.0)
MCV: 99.6 fL (ref 80.0–100.0)
Platelets: 91 10*3/uL — ABNORMAL LOW (ref 150–400)
RBC: 2.57 MIL/uL — ABNORMAL LOW (ref 3.87–5.11)
RDW: 12.6 % (ref 11.5–15.5)
WBC: 7.2 10*3/uL (ref 4.0–10.5)
nRBC: 0 % (ref 0.0–0.2)

## 2020-12-14 LAB — PREPARE FRESH FROZEN PLASMA: Unit division: 0

## 2020-12-14 LAB — POCT I-STAT 7, (LYTES, BLD GAS, ICA,H+H)
Acid-Base Excess: 0 mmol/L (ref 0.0–2.0)
Acid-base deficit: 5 mmol/L — ABNORMAL HIGH (ref 0.0–2.0)
Bicarbonate: 25.2 mmol/L (ref 20.0–28.0)
Bicarbonate: 20.5 mmol/L (ref 20.0–28.0)
Calcium, Ion: 1.22 mmol/L (ref 1.15–1.40)
Calcium, Ion: 1.16 mmol/L (ref 1.15–1.40)
HCT: 37 % (ref 36.0–46.0)
HCT: 37 % (ref 36.0–46.0)
Hemoglobin: 12.6 g/dL (ref 12.0–15.0)
Hemoglobin: 12.6 g/dL (ref 12.0–15.0)
O2 Saturation: 100 %
O2 Saturation: 99 %
Patient temperature: 35.3
Potassium: 3.1 mmol/L — ABNORMAL LOW (ref 3.5–5.1)
Potassium: 3.8 mmol/L (ref 3.5–5.1)
Sodium: 140 mmol/L (ref 135–145)
Sodium: 140 mmol/L (ref 135–145)
TCO2: 26 mmol/L (ref 22–32)
TCO2: 22 mmol/L (ref 22–32)
pCO2 arterial: 38.4 mmHg (ref 32.0–48.0)
pCO2 arterial: 38.1 mmHg (ref 32.0–48.0)
pH, Arterial: 7.417 (ref 7.350–7.450)
pH, Arterial: 7.339 — ABNORMAL LOW (ref 7.350–7.450)
pO2, Arterial: 520 mmHg — ABNORMAL HIGH (ref 83.0–108.0)
pO2, Arterial: 174 mmHg — ABNORMAL HIGH (ref 83.0–108.0)

## 2020-12-14 LAB — BPAM PLATELET PHERESIS
Blood Product Expiration Date: 202203172359
ISSUE DATE / TIME: 202203171225
Unit Type and Rh: 7300

## 2020-12-14 LAB — PROTIME-INR
INR: 1.4 — ABNORMAL HIGH (ref 0.8–1.2)
Prothrombin Time: 16.2 seconds — ABNORMAL HIGH (ref 11.4–15.2)

## 2020-12-14 LAB — PREPARE PLATELET PHERESIS: Unit division: 0

## 2020-12-14 LAB — BPAM FFP
Blood Product Expiration Date: 202203202359
Blood Product Expiration Date: 202203202359
Unit Type and Rh: 600
Unit Type and Rh: 6200

## 2020-12-14 LAB — PHOSPHORUS: Phosphorus: 3.8 mg/dL (ref 2.5–4.6)

## 2020-12-14 LAB — MAGNESIUM: Magnesium: 1.8 mg/dL (ref 1.7–2.4)

## 2020-12-14 MED ORDER — ALBUMIN HUMAN 5 % IV SOLN
25.0000 g | Freq: Once | INTRAVENOUS | Status: AC
  Administered 2020-12-14: 25 g via INTRAVENOUS
  Filled 2020-12-14: qty 500

## 2020-12-14 NOTE — Anesthesia Post-op Follow-up Note (Signed)
  Anesthesia Pain Follow-up Note  Patient: Stephanie [name]  Day #: 1  Date of Follow-up: 12/14/2020 Time: 11:44 AM  Last Vitals:  Vitals:   12/14/20 1000 12/14/20 1100  BP: (!) 101/58 103/64  Pulse: 68 79  Resp: 11 16  Temp:    SpO2: 95% 98%    Level of Consciousness: alert  Pain: 7 /10, mostly intra-abdominal pain, incisional pain is minimal per patient.   Epidural Level: T6-L1 bilateral to ice test  Side Effects:None  Catheter Site Exam:clean, dry  Epidural / Intrathecal (From admission, onward)   Start     Dose/Rate Route Frequency Ordered Stop   12/13/20 1100  ropivacaine (PF) 2 mg/mL (0.2%) (NAROPIN) injection        10 mL/hr 10 mL/hr  Epidural Continuous 12/13/20 1051         Plan:  - Continue current therapy of postop epidural at surgeon's request. - Will not change rate at present time - Will continue to follow - IV PCA being considered by surgeon if hemodynamics remain stable.  Crissie Sickles Smith Robert, MD, [facility]

## 2020-12-14 NOTE — Progress Notes (Signed)
1 Day Post-Op   Subjective/Chief Complaint: Had a lot of pain overnight.  No n/v.  Pain improved at this point.     Objective: Vital signs in last 24 hours: Temp:  [97 F (36.1 C)-98.5 F (36.9 C)] 98.5 F (36.9 C) (03/18 0800) Pulse Rate:  [60-86] 63 (03/18 0900) Resp:  [8-24] 13 (03/18 0900) BP: (83-126)/(49-79) 101/57 (03/18 0900) SpO2:  [94 %-100 %] 98 % (03/18 0900) Arterial Line BP: (105-171)/(44-75) 124/56 (03/18 0900)    Intake/Output from previous day: 03/17 0701 - 03/18 0700 In: 3751.4 [I.V.:2247.8; Blood:380; IV [id number]:505] Out: 2220 [Urine:1110; Drains:650; Blood:460] Intake/Output this shift: No intake/output data recorded.  General appearance: alert, cooperative and no distress Resp: breathing comfortably Cardio: regular rate and rhythm GI: soft, non distended, approp tender.  some blood staining on dressing.  some bile staining to serosang discharge.   Extremities: extremities normal, atraumatic, no cyanosis or edema  Lab Results:  Recent Labs    12/13/20 0831 12/13/20 1047 12/13/20 1237 12/14/20 0236  WBC  --   --   --  7.2  HGB  --    < > 12.6 8.4*  HCT  --    < > 37.0 25.6*  PLT 91*  --   --  91*   < > = values in this interval not displayed.   BMET Recent Labs    12/13/20 1237 12/14/20 0236  NA 140 136  K 3.8 4.5  CL  --  107  CO2  --  25  GLUCOSE  --  124*  BUN  --  10  CREATININE  --  0.63  CALCIUM  --  8.0*   PT/INR Recent Labs    12/14/20 0236  LABPROT 16.2*  INR 1.4*   ABG Recent Labs    12/13/20 1047 12/13/20 1237  PHART 7.417 7.339*  HCO3 25.2 20.5    Studies/Results: No results found.  Anti-infectives: Anti-infectives (From admission, onward)   Start     Dose/Rate Route Frequency Ordered Stop   12/13/20 1900  ceFAZolin (ANCEF) IVPB 2g/100 mL premix        2 g 200 mL/hr over 30 Minutes Intravenous Every 8 hours 12/13/20 1459 12/13/20 1832   12/13/20 1600  valACYclovir (VALTREX) tablet 500 mg        500  mg Oral 2 times daily 12/13/20 1459     12/13/20 0830  ceFAZolin (ANCEF) IVPB 2g/100 mL premix        2 g 200 mL/hr over 30 Minutes Intravenous On call to O.R. 12/13/20 0824 12/13/20 1105      Assessment/Plan: s/p Procedure(s) with comments: OPEN PARTIAL HEPATECTOMY (N/A) -  LAPAROSCOPY DIAGNOSTIC (N/A) INTRAOPERATIVE LIVER ULTRASOUND (N/A) For metastatic colon cancer- pathology pending.   leave foley for urinary output monitoring.  Was low overnight and pt hypotensive  Epidural and PRN dilaudid, prn robaxin for pain control Full liquids ABL anemia - watch. Encouraged IS.     LOS: 1 day    Stephanie [name] 12/14/2020

## 2020-12-14 NOTE — Plan of Care (Signed)
Patient does seem anxious and withdrawn at times, but she also seems very interested in necessary steps she must take going forward in managing her own care. She has asked a lot of meaningful questions and seems very eager to learn more about what can be done on her end.

## 2020-12-15 LAB — PROTIME-INR
INR: 1.5 — ABNORMAL HIGH (ref 0.8–1.2)
Prothrombin Time: 17.1 seconds — ABNORMAL HIGH (ref 11.4–15.2)

## 2020-12-15 LAB — COMPREHENSIVE METABOLIC PANEL
ALT: 173 U/L — ABNORMAL HIGH (ref 0–44)
AST: 125 U/L — ABNORMAL HIGH (ref 15–41)
Albumin: 3 g/dL — ABNORMAL LOW (ref 3.5–5.0)
Alkaline Phosphatase: 27 U/L — ABNORMAL LOW (ref 38–126)
Anion gap: 1 — ABNORMAL LOW (ref 5–15)
BUN: <5 mg/dL — ABNORMAL LOW (ref 6–20)
CO2: 30 mmol/L (ref 22–32)
Calcium: 8.1 mg/dL — ABNORMAL LOW (ref 8.9–10.3)
Chloride: 109 mmol/L (ref 98–111)
Creatinine, Ser: 0.59 mg/dL (ref 0.44–1.00)
GFR, Estimated: >60 mL/min (ref >60)
Glucose, Bld: 96 mg/dL (ref 70–99)
Potassium: 3.9 mmol/L (ref 3.5–5.1)
Sodium: 140 mmol/L (ref 135–145)
Total Bilirubin: 0.8 mg/dL (ref 0.3–1.2)
Total Protein: 4.3 g/dL — ABNORMAL LOW (ref 6.5–8.1)

## 2020-12-15 LAB — CBC
HCT: 21.1 % — ABNORMAL LOW (ref 36.0–46.0)
HCT: 30.6 % — ABNORMAL LOW (ref 36.0–46.0)
Hemoglobin: 6.8 g/dL — CL (ref 12.0–15.0)
Hemoglobin: 10 g/dL — ABNORMAL LOW (ref 12.0–15.0)
MCH: 32.7 pg (ref 26.0–34.0)
MCH: 32.1 pg (ref 26.0–34.0)
MCHC: 32.2 g/dL (ref 30.0–36.0)
MCHC: 32.7 g/dL (ref 30.0–36.0)
MCV: 101.4 fL — ABNORMAL HIGH (ref 80.0–100.0)
MCV: 98.1 fL (ref 80.0–100.0)
Platelets: 60 10*3/uL — ABNORMAL LOW (ref 150–400)
Platelets: 71 10*3/uL — ABNORMAL LOW (ref 150–400)
RBC: 2.08 MIL/uL — ABNORMAL LOW (ref 3.87–5.11)
RBC: 3.12 MIL/uL — ABNORMAL LOW (ref 3.87–5.11)
RDW: 12.6 % (ref 11.5–15.5)
RDW: 13.6 % (ref 11.5–15.5)
WBC: 5 10*3/uL (ref 4.0–10.5)
WBC: 8.1 10*3/uL (ref 4.0–10.5)
nRBC: 0 % (ref 0.0–0.2)
nRBC: 0 % (ref 0.0–0.2)

## 2020-12-15 LAB — PHOSPHORUS: Phosphorus: 2.7 mg/dL (ref 2.5–4.6)

## 2020-12-15 LAB — MAGNESIUM: Magnesium: 1.6 mg/dL — ABNORMAL LOW (ref 1.7–2.4)

## 2020-12-15 LAB — TRAUMA TEG PANEL
CFF Max Amplitude: 15.5 mm (ref 15–32)
Citrated Kaolin (R): 4.6 min (ref 4.6–9.1)
Citrated Rapid TEG (MA): 44.9 mm — ABNORMAL LOW (ref 52–70)
Lysis at 30 Minutes: 0.6 % (ref 0.0–2.6)

## 2020-12-15 LAB — PREPARE RBC (CROSSMATCH)

## 2020-12-15 MED ORDER — SODIUM CHLORIDE 0.9% IV SOLUTION: Freq: Once | INTRAVENOUS | Status: AC

## 2020-12-15 MED ORDER — POTASSIUM PHOSPHATES 15 MMOLE/5ML IV SOLN
20.0000 mmol | Freq: Once | INTRAVENOUS | Status: AC
  Administered 2020-12-15: 20 mmol via INTRAVENOUS
  Filled 2020-12-15: qty 6.67

## 2020-12-15 MED ORDER — MAGNESIUM SULFATE 2 GM/50ML IV SOLN
2.0000 g | Freq: Once | INTRAVENOUS | Status: AC
  Administered 2020-12-15: 2 g via INTRAVENOUS
  Filled 2020-12-15: qty 50

## 2020-12-15 NOTE — Anesthesia Post-op Follow-up Note (Signed)
  Anesthesia Pain Follow-up Note  Patient: Stephanie [name]  Day #: 2  Date of Follow-up: 12/15/2020 Time: 10:11 PM  Last Vitals:  Vitals:   12/15/20 2043 12/15/20 2100  BP: (!) 83/55 97/78  Pulse: 70 80  Resp: 12 16  Temp: 36.4 C 36.4 C  SpO2: 95% 94%    Level of Consciousness: alert  Pain: moderate   Side Effects:None  Catheter Site Exam:clean  Epidural / Intrathecal (From admission, onward)   Start     Dose/Rate Route Frequency Ordered Stop   12/13/20 1100  ropivacaine (PF) 2 mg/mL (0.2%) (NAROPIN) injection        10 mL/hr 10 mL/hr  Epidural Continuous 12/13/20 1051         Plan: Continue current therapy of postop epidural at surgeon's request  - T6 level bilaterally, moderate pain per patient at incision site.   [place]

## 2020-12-15 NOTE — Plan of Care (Signed)
  Problem: Clinical Measurements: Goal: Ability to maintain clinical measurements within normal limits will improve Outcome: Progressing Goal: Postoperative complications will be avoided or minimized Outcome: Progressing   Problem: Skin Integrity: Goal: Demonstration of wound healing without infection will improve Outcome: Progressing   Problem: Education: Goal: Knowledge of General Education information will improve Description: Including pain rating scale, medication(s)/side effects and non-pharmacologic comfort measures Outcome: Progressing   Problem: Health Behavior/Discharge Planning: Goal: Ability to manage health-related needs will improve Outcome: Progressing   Problem: Clinical Measurements: Goal: Ability to maintain clinical measurements within normal limits will improve Outcome: Progressing Goal: Will remain free from infection Outcome: Progressing Goal: Respiratory complications will improve Outcome: Progressing Goal: Cardiovascular complication will be avoided Outcome: Progressing   Problem: Nutrition: Goal: Adequate nutrition will be maintained Outcome: Progressing   Problem: Coping: Goal: Level of anxiety will decrease Outcome: Progressing   Problem: Elimination: Goal: Will not experience complications related to bowel motility Outcome: Progressing Goal: Will not experience complications related to urinary retention Outcome: Progressing   Problem: Pain Managment: Goal: General experience of comfort will improve Outcome: Progressing   Problem: Safety: Goal: Ability to remain free from injury will improve Outcome: Progressing   Problem: Skin Integrity: Goal: Risk for impaired skin integrity will decrease Outcome: Progressing

## 2020-12-15 NOTE — Progress Notes (Signed)
General Surgery Follow Up Note  Subjective:    Overnight Issues:   Objective:  Vital signs for last 24 hours: Temp:  [97.1 F (36.2 C)-98.6 F (37 C)] 97.1 F (36.2 C) (03/19 0909) Pulse Rate:  [68-83] 76 (03/19 0909) Resp:  [7-17] 16 (03/19 0909) BP: (89-106)/(53-67) 100/54 (03/19 0909) SpO2:  [92 %-99 %] 97 % (03/19 0909) Arterial Line BP: (128)/(58) 128/58 (03/18 1000)  Hemodynamic parameters for last 24 hours:    Intake/Output from previous day: 03/18 0701 - 03/19 0700 In: 3361.1 [P.O.:350; I.V.:2272.3; Blood:315; IV Piggyback:193.8] Out: 3668 [Urine:3148; Drains:520]  Intake/Output this shift: Total I/O In: 315 [Blood:315] Out: -   Vent settings for last 24 hours:    Physical Exam:  Gen: comfortable, no distress Neuro: non-focal exam HEENT: PERRL Neck: supple CV: RRR Pulm: unlabored breathing Abd: soft, appropriately TTP, incision c/d/i with staples, ostomy productive of air and a minimal amount of hard stool, JP with SS o/p-520cc GU: clear yellow urine Extr: wwp, no edema   Results for orders placed or performed during the hospital encounter of 12/13/20 (from the past 24 hour(s))  Comprehensive metabolic panel     Status: Abnormal   Collection Time: 12/15/20  1:56 AM  Result Value Ref Range   Sodium 140 135 - 145 mmol/L   Potassium 3.9 3.5 - 5.1 mmol/L   Chloride 109 98 - 111 mmol/L   CO2 30 22 - 32 mmol/L   Glucose, Bld 96 70 - 99 mg/dL   BUN <5 (L) 6 - 20 mg/dL   Creatinine, Ser 0.59 0.44 - 1.00 mg/dL   Calcium 8.1 (L) 8.9 - 10.3 mg/dL   Total Protein 4.3 (L) 6.5 - 8.1 g/dL   Albumin 3.0 (L) 3.5 - 5.0 g/dL   AST 125 (H) 15 - 41 U/L   ALT 173 (H) 0 - 44 U/L   Alkaline Phosphatase 27 (L) 38 - 126 U/L   Total Bilirubin 0.8 0.3 - 1.2 mg/dL   GFR, Estimated >60 >60 mL/min   Anion gap 1 (L) 5 - 15  Magnesium     Status: Abnormal   Collection Time: 12/15/20  1:56 AM  Result Value Ref Range   Magnesium 1.6 (L) 1.7 - 2.4 mg/dL  Phosphorus      Status: None   Collection Time: 12/15/20  1:56 AM  Result Value Ref Range   Phosphorus 2.7 2.5 - 4.6 mg/dL  CBC     Status: Abnormal   Collection Time: 12/15/20  1:56 AM  Result Value Ref Range   WBC 5.0 4.0 - 10.5 K/uL   RBC 2.08 (L) 3.87 - 5.11 MIL/uL   Hemoglobin 6.8 (LL) 12.0 - 15.0 g/dL   HCT 21.1 (L) 36.0 - 46.0 %   MCV 101.4 (H) 80.0 - 100.0 fL   MCH 32.7 26.0 - 34.0 pg   MCHC 32.2 30.0 - 36.0 g/dL   RDW 12.6 11.5 - 15.5 %   Platelets 60 (L) 150 - 400 K/uL   nRBC 0.0 0.0 - 0.2 %  Protime-INR     Status: Abnormal   Collection Time: 12/15/20  1:56 AM  Result Value Ref Range   Prothrombin Time 17.1 (H) 11.4 - 15.2 seconds   INR 1.5 (H) 0.8 - 1.2  Prepare RBC (crossmatch)     Status: None   Collection Time: 12/15/20  3:40 AM  Result Value Ref Range   Order Confirmation      ORDER PROCESSED BY BLOOD BANK 4 UNITS  PRBCS READY FOR PICKUP Performed at [facility], [place] [address], [place], [place] [address]     Assessment & Plan:  Present on Admission: . Colon cancer ([place]) . Metastatic colon cancer to liver ([place])    LOS: 2 days   Additional comments:I reviewed the patient's new clinical lab test results.   and I reviewed the patients new imaging test results.    s/p Procedure(s) with comments: OPEN PARTIAL HEPATECTOMY (N/A) -  LAPAROSCOPY DIAGNOSTIC (N/A) INTRAOPERATIVE LIVER ULTRASOUND (N/A) For metastatic colon cancer- pathology pending.  Leave foley for urinary output monitoring Epidural and PRN dilaudid, prn robaxin for pain control Soft diet ABL anemia - 1u pRBC this AM, repeat BC in AM Encouraged IS/ambulation   Jesusita Oka, MD Trauma & General Surgery Please use [email or web address] to contact on call provider  12/15/2020  *Care during the described time interval was provided by me. I have reviewed this patient's available data, including medical history, events of note, physical examination and test results as part of my evaluation.

## 2020-12-16 LAB — COMPREHENSIVE METABOLIC PANEL
ALT: 145 U/L — ABNORMAL HIGH (ref 0–44)
AST: 85 U/L — ABNORMAL HIGH (ref 15–41)
Albumin: 2.9 g/dL — ABNORMAL LOW (ref 3.5–5.0)
Alkaline Phosphatase: 40 U/L (ref 38–126)
Anion gap: 3 — ABNORMAL LOW (ref 5–15)
BUN: 5 mg/dL — ABNORMAL LOW (ref 6–20)
CO2: 30 mmol/L (ref 22–32)
Calcium: 8.3 mg/dL — ABNORMAL LOW (ref 8.9–10.3)
Chloride: 103 mmol/L (ref 98–111)
Creatinine, Ser: 0.65 mg/dL (ref 0.44–1.00)
GFR, Estimated: >60 mL/min (ref >60)
Glucose, Bld: 92 mg/dL (ref 70–99)
Potassium: 4 mmol/L (ref 3.5–5.1)
Sodium: 136 mmol/L (ref 135–145)
Total Bilirubin: 1.2 mg/dL (ref 0.3–1.2)
Total Protein: 4.7 g/dL — ABNORMAL LOW (ref 6.5–8.1)

## 2020-12-16 LAB — MAGNESIUM: Magnesium: 1.8 mg/dL (ref 1.7–2.4)

## 2020-12-16 LAB — TYPE AND SCREEN
ABO/RH(D): O POS
Antibody Screen: NEGATIVE
Unit division: 0
Unit division: 0
Unit division: 0
Unit division: 0

## 2020-12-16 LAB — CBC
HCT: 31.7 % — ABNORMAL LOW (ref 36.0–46.0)
Hemoglobin: 10.4 g/dL — ABNORMAL LOW (ref 12.0–15.0)
MCH: 31.9 pg (ref 26.0–34.0)
MCHC: 32.8 g/dL (ref 30.0–36.0)
MCV: 97.2 fL (ref 80.0–100.0)
Platelets: 74 10*3/uL — ABNORMAL LOW (ref 150–400)
RBC: 3.26 MIL/uL — ABNORMAL LOW (ref 3.87–5.11)
RDW: 13.2 % (ref 11.5–15.5)
WBC: 6.3 10*3/uL (ref 4.0–10.5)
nRBC: 0 % (ref 0.0–0.2)

## 2020-12-16 LAB — BPAM RBC
Blood Product Expiration Date: 202204142359
Blood Product Expiration Date: 202204202359
Blood Product Expiration Date: 202204202359
Blood Product Expiration Date: 202204202359
ISSUE DATE / TIME: 202203190405
ISSUE DATE / TIME: 202203190946
Unit Type and Rh: 5100
Unit Type and Rh: 5100
Unit Type and Rh: 5100
Unit Type and Rh: 5100

## 2020-12-16 LAB — PREPARE PLATELET PHERESIS: Unit division: 0

## 2020-12-16 LAB — PHOSPHORUS: Phosphorus: 4.4 mg/dL (ref 2.5–4.6)

## 2020-12-16 LAB — BPAM PLATELET PHERESIS
Blood Product Expiration Date: 202203202359
ISSUE DATE / TIME: 202203191344
Unit Type and Rh: 5100

## 2020-12-16 LAB — PROTIME-INR
INR: 1.2 (ref 0.8–1.2)
Prothrombin Time: 14.7 seconds (ref 11.4–15.2)

## 2020-12-16 MED ORDER — MAGNESIUM SULFATE 2 GM/50ML IV SOLN
2.0000 g | Freq: Once | INTRAVENOUS | Status: AC
  Administered 2020-12-16: 2 g via INTRAVENOUS
  Filled 2020-12-16: qty 50

## 2020-12-16 MED ORDER — TRAMADOL HCL 50 MG PO TABS
50.0000 mg | ORAL_TABLET | Freq: Four times a day (QID) | ORAL | Status: DC | PRN
  Administered 2020-12-17: 50 mg via ORAL
  Filled 2020-12-16: qty 1

## 2020-12-16 MED ORDER — OXYCODONE HCL 5 MG/5ML PO SOLN
5.0000 mg | ORAL | Status: DC | PRN
Start: 2020-12-16 — End: 2020-12-17
  Administered 2020-12-16 – 2020-12-17 (×5): 10 mg via ORAL
  Filled 2020-12-16 (×5): qty 10

## 2020-12-16 NOTE — Plan of Care (Signed)
  Problem: Clinical Measurements: Goal: Ability to maintain clinical measurements within normal limits will improve Outcome: Progressing Goal: Postoperative complications will be avoided or minimized Outcome: Progressing   

## 2020-12-16 NOTE — Progress Notes (Signed)
Patient refused having foley removed until her epidural was stopped. Patient states she doesn't have urge to pee and cant feel her feet.  Stephanie [name], [name], RN

## 2020-12-16 NOTE — Progress Notes (Signed)
General Surgery Follow Up Note  Subjective:    Overnight Issues:   Objective:  Vital signs for last 24 hours: Temp:  [97.1 F (36.2 C)-98.4 F (36.9 C)] 98.4 F (36.9 C) (03/20 0800) Pulse Rate:  [63-80] 68 (03/20 0800) Resp:  [12-20] 14 (03/20 0800) BP: (83-112)/(54-78) 112/68 (03/20 0800) SpO2:  [91 %-99 %] 93 % (03/20 0800)  Hemodynamic parameters for last 24 hours:    Intake/Output from previous day: 03/19 0701 - 03/20 0700 In: 1944.5 [P.O.:120; I.V.:750; Blood:1074.5] Out: 3480 [Urine:3450; Drains:30]  Intake/Output this shift: Total I/O In: 240 [P.O.:240] Out: 20 [Drains:20]  Vent settings for last 24 hours:    Physical Exam:  Gen: comfortable, no distress Neuro: non-focal exam HEENT: PERRL Neck: supple CV: RRR Pulm: unlabored breathing Abd: soft, approriately TTP, incision c/d/i with staples, JP with 30cc o/p GU: clear yellow urine Extr: wwp, no edema   Results for orders placed or performed during the hospital encounter of 12/13/20 (from the past 24 hour(s))  Trauma TEG Panel     Status: Abnormal   Collection Time: 12/15/20  9:35 AM  Result Value Ref Range   Citrated Kaolin (R) 4.6 4.6 - 9.1 min   Citrated Rapid TEG (MA) 44.9 (L) 52 - 70 mm   CFF Max Amplitude 15.5 15 - 32 mm   Lysis at 30 Minutes 0.6 0.0 - 2.6 %  Prepare Pheresed Platelets     Status: None (Preliminary result)   Collection Time: 12/15/20 12:57 PM  Result Value Ref Range   Unit Number [id number]    Blood Component Type PLTP2 PSORALEN TREATED    Unit division 00    Status of Unit ISSUED    Transfusion Status      OK TO TRANSFUSE Performed at [facility] [address], [place], [place] [address]   CBC     Status: Abnormal   Collection Time: 12/15/20  3:58 PM  Result Value Ref Range   WBC 8.1 4.0 - 10.5 K/uL   RBC 3.12 (L) 3.87 - 5.11 MIL/uL   Hemoglobin 10.0 (L) 12.0 - 15.0 g/dL   HCT 30.6 (L) 36.0 - 46.0 %   MCV 98.1 80.0 - 100.0 fL   MCH 32.1 26.0 - 34.0  pg   MCHC 32.7 30.0 - 36.0 g/dL   RDW 13.6 11.5 - 15.5 %   Platelets 71 (L) 150 - 400 K/uL   nRBC 0.0 0.0 - 0.2 %  Comprehensive metabolic panel     Status: Abnormal   Collection Time: 12/16/20  1:42 AM  Result Value Ref Range   Sodium 136 135 - 145 mmol/L   Potassium 4.0 3.5 - 5.1 mmol/L   Chloride 103 98 - 111 mmol/L   CO2 30 22 - 32 mmol/L   Glucose, Bld 92 70 - 99 mg/dL   BUN 5 (L) 6 - 20 mg/dL   Creatinine, Ser 0.65 0.44 - 1.00 mg/dL   Calcium 8.3 (L) 8.9 - 10.3 mg/dL   Total Protein 4.7 (L) 6.5 - 8.1 g/dL   Albumin 2.9 (L) 3.5 - 5.0 g/dL   AST 85 (H) 15 - 41 U/L   ALT 145 (H) 0 - 44 U/L   Alkaline Phosphatase 40 38 - 126 U/L   Total Bilirubin 1.2 0.3 - 1.2 mg/dL   GFR, Estimated >60 >60 mL/min   Anion gap 3 (L) 5 - 15  Magnesium     Status: None   Collection Time: 12/16/20  1:42  AM  Result Value Ref Range   Magnesium 1.8 1.7 - 2.4 mg/dL  Phosphorus     Status: None   Collection Time: 12/16/20  1:42 AM  Result Value Ref Range   Phosphorus 4.4 2.5 - 4.6 mg/dL  CBC     Status: Abnormal   Collection Time: 12/16/20  1:42 AM  Result Value Ref Range   WBC 6.3 4.0 - 10.5 K/uL   RBC 3.26 (L) 3.87 - 5.11 MIL/uL   Hemoglobin 10.4 (L) 12.0 - 15.0 g/dL   HCT 31.7 (L) 36.0 - 46.0 %   MCV 97.2 80.0 - 100.0 fL   MCH 31.9 26.0 - 34.0 pg   MCHC 32.8 30.0 - 36.0 g/dL   RDW 13.2 11.5 - 15.5 %   Platelets 74 (L) 150 - 400 K/uL   nRBC 0.0 0.0 - 0.2 %  Protime-INR     Status: None   Collection Time: 12/16/20  1:42 AM  Result Value Ref Range   Prothrombin Time 14.7 11.4 - 15.2 seconds   INR 1.2 0.8 - 1.2    Assessment & Plan: The plan of care was discussed with the bedside nurse for the day, who is in agreement with this plan and no additional concerns were raised.   Present on Admission: . Colon cancer ([place]) . Metastatic colon cancer to liver ([place])    LOS: 3 days   Additional comments:I reviewed the patient's new clinical lab test results.   and I reviewed the patients  new imaging test results.    s/pProcedure(s) with comments: OPEN PARTIAL HEPATECTOMY (N/A) -  LAPAROSCOPY DIAGNOSTIC (N/A) INTRAOPERATIVE LIVER ULTRASOUND (N/A) For metastatic colon cancer- pathology pending.  D/c foley Epidural and PRN dilaudid, prn robaxin, oxy, tramadol for pain control Soft diet ABL anemia - 1u pRBC and 1u plts yest with appropriate response JP to remain for now Encourage IS/ambulation   Jesusita Oka, MD Trauma & General Surgery Please use [email or web address] to contact on call provider  12/16/2020  *Care during the described time interval was provided by me. I have reviewed this patient's available data, including medical history, events of note, physical examination and test results as part of my evaluation.

## 2020-12-16 NOTE — Anesthesia Post-op Follow-up Note (Signed)
°  Anesthesia Pain Follow-up Note  Patient: Stephanie [name]  Day #: 3  Date of Follow-up: 12/16/2020 Time: 1:34 PM  Last Vitals:  Vitals:   12/16/20 0800 12/16/20 1100  BP: 112/68 108/69  Pulse: 68 67  Resp: 14 14  Temp: 36.9 C 37.2 C  SpO2: 93% 93%    Level of Consciousness: alert  Pain: moderate , incisional.    Side Effects:None, although she is unable to ambulate as her R leg is quite numb, and she is unable to lift it from the bed.  Catheter Site Exam:clean, dry, no drainage  Epidural / Intrathecal (From admission, onward)   Start     Dose/Rate Route Frequency Ordered Stop   12/13/20 1100  ropivacaine (PF) 2 mg/mL (0.2%) (NAROPIN) injection        10 mL/hr 10 mL/hr  Epidural Continuous 12/13/20 1051         Plan: Continue current therapy of postop epidural at surgeon's request  Nolon Nations

## 2020-12-16 NOTE — Evaluation (Signed)
Physical Therapy Evaluation Patient Details Name: Stephanie [name] MRN: [id number] DOB: 03-May-1981 Today's Date: 12/16/2020   History of Present Illness  The patient is a 40 year old female who presents for a follow-up for Colorectal cancer. Pt underwent OPEN PARTIAL HEPATECTOMY 3/17 and an epidural was placed for pain control and was still in place 3/20 on PT eval. PMH: Colon Cancer diagnosed in 03/12/2020.Pt had surgery 04/04/2020 -  open sigmoid colectomy with ostomy and wound left open.  She also had a liver mass that looked like it might be benign on imaging, but has since been biopsied and is positive for metastatic disease.  The patient got a port and was d/c 04/16/20. She had 6 months of chemotherapy.Restaging scans 2/11 showed continued decrease in the size of the mass.   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  Clinical Impression  Pt admitted with above diagnosis. Pt was only able to perform bed level activity due to right LE weakness and numbness due to epidural.  MD messaged by PT and to address the epidural removal tomorrow am. Will follow acutely and progress pt as able.  Pt most likely will be a good rehab candidate.  Pt currently with functional limitations due to the deficits listed below (see PT Problem List). Pt will benefit from skilled PT to increase their independence and safety with mobility to allow discharge to the venue listed below.    Follow Up Recommendations CIR    Equipment Recommendations  Rolling walker with 5" wheels;3in1 (PT)    Recommendations for Other Services Rehab consult     Precautions / Restrictions Precautions Precautions: Fall Precaution Comments: Epidural in place - messaged MD regarding possibly removal for mobility as pt having difficulty moving in bed and moving right LE due to numbness and weakness- MD returned message and states it will be addressed 3/21 in am. Restrictions Weight Bearing Restrictions: No      Mobility  Bed  Mobility Overal bed mobility: Needs Assistance Bed Mobility: Rolling Rolling: Min assist         General bed mobility comments: needs assist to roll.  Bed level eval due to pt states she cannot move right LE adequately with epidural in place as it is numb and weak. noted weakness right hemibody.    Transfers                    Ambulation/Gait                Stairs            Wheelchair Mobility    Modified Rankin (Stroke Patients Only)       Balance                                             Pertinent Vitals/Pain Pain Assessment: Faces Faces Pain Scale: Hurts worst Pain Location: abdomen radiating to lower back Pain Descriptors / Indicators: Radiating;Heaviness Pain Intervention(s): Limited activity within patient's tolerance;Monitored during session;Repositioned;Premedicated before session (Epidural in place)    Home Living Family/patient expects to be discharged to:: Private residence Living Arrangements: Children;Parent Available Help at Discharge: Family;Available PRN/intermittently (mpm at work during day so pt alone during day) Type of Home: House Home Access: Stairs to enter Entrance Stairs-Rails: Right Entrance Stairs-Number of Steps: 2 Home Layout: One level Home Equipment: None  Prior Function Level of Independence: Independent         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     Hand Dominance   Dominant Hand: Right    Extremity/Trunk Assessment   Upper Extremity Assessment Upper Extremity Assessment: Defer to OT evaluation    Lower Extremity Assessment Lower Extremity Assessment: RLE deficits/detail;LLE deficits/detail RLE Deficits / Details: grossly 2/5 RLE Sensation: history of peripheral neuropathy LLE Deficits / Details: grossly 3/5 LLE Sensation: history of peripheral neuropathy    Cervical / Trunk Assessment Cervical / Trunk Assessment: Normal   Communication   Communication: No difficulties  Cognition Arousal/Alertness: Awake/alert Behavior During Therapy: WFL for tasks assessed/performed Overall Cognitive Status: Within Functional Limits for tasks assessed                                        General Comments General comments (skin integrity, edema, etc.): 71 bpm, 92% RA,13, 106/69 at rest;  left > right LE movement    Exercises General Exercises - Upper Extremity Shoulder Flexion: AROM;Both;5 reps;Supine Shoulder ABduction: AROM;Both;5 reps;Supine Elbow Flexion: AROM;Both;10 reps;Supine Elbow Extension: AROM;Both;10 reps;Supine General Exercises - Lower Extremity Ankle Circles/Pumps: AROM;Both;10 reps;Supine Quad Sets: AROM;Both;10 reps;Supine Heel Slides: 10 reps;Supine;AAROM;Right (AROM left) Hip ABduction/ADduction: AROM;Both;10 reps;Supine Straight Leg Raises: AAROM;Right;10 reps;Supine (AROM left)   Assessment/Plan    PT Assessment Patient needs continued PT services  PT Problem List Decreased balance;Decreased mobility;Decreased activity tolerance;Decreased strength;Decreased range of motion;Decreased knowledge of use of DME;Decreased safety awareness;Decreased knowledge of precautions       PT Treatment Interventions DME instruction;Gait training;Functional mobility training;Therapeutic activities;Therapeutic exercise;Balance training;Patient/family education    PT Goals (Current goals can be found in the Care Plan section)  Acute Rehab PT Goals Patient Stated Goal: to get to where I can move PT Goal Formulation: With patient Time For Goal Achievement: 12/30/20 Potential to Achieve Goals: Good    Frequency Min 3X/week   Barriers to discharge        Co-evaluation               AM-PAC PT "6 Clicks" Mobility  Outcome Measure Help needed turning from your back to your side while in a flat bed without using bedrails?: A Little Help needed moving from lying on your back to  sitting on the side of a flat bed without using bedrails?: A Little Help needed moving to and from a bed to a chair (including a wheelchair)?: Total Help needed standing up from a chair using your arms (e.g., wheelchair or bedside chair)?: Total Help needed to walk in hospital room?: Total Help needed climbing 3-5 steps with a railing? : Total 6 Click Score: 10    End of Session Equipment Utilized During Treatment: Gait belt Activity Tolerance: Patient limited by fatigue (limited by epidural) Patient left: in bed;with call bell/phone within reach;with bed alarm set;with SCD's reapplied Nurse Communication: Mobility status PT Visit Diagnosis: Muscle weakness (generalized) (M62.81)    Time: 7902-4097 PT Time Calculation (min) (ACUTE ONLY): 19 min   Charges:   PT Evaluation $PT Eval Moderate Complexity: 1 Mod          Marnie Fazzino M,PT Acute Rehab Services [phone] (pager)  Alvira Philips 12/16/2020, 5:35 PM

## 2020-12-17 LAB — GLOBAL TEG PANEL
CFF Max Amplitude: 32.3 mm — ABNORMAL HIGH (ref 15–32)
CK with Heparinase (R): 4.8 min (ref 4.3–8.3)
Citrated Functional Fibrinogen: 589.4 mg/dL — ABNORMAL HIGH (ref 278–581)
Citrated Kaolin (K): 0.9 min (ref 0.8–2.1)
Citrated Kaolin (MA): 69 mm (ref 52–69)
Citrated Kaolin (R): 4.7 min (ref 4.6–9.1)
Citrated Kaolin Angle: 77.1 deg (ref 63–78)
Citrated Rapid TEG (MA): 65.9 mm (ref 52–70)

## 2020-12-17 LAB — SURGICAL PATHOLOGY

## 2020-12-17 MED ORDER — SODIUM CHLORIDE 0.9 % IV SOLN: 250.0000 mL | INTRAVENOUS | Status: DC | PRN

## 2020-12-17 MED ORDER — METHOCARBAMOL 500 MG PO TABS
500.0000 mg | ORAL_TABLET | Freq: Four times a day (QID) | ORAL | Status: DC | PRN
  Administered 2020-12-17 – 2020-12-22 (×8): 500 mg via ORAL
  Filled 2020-12-17 (×8): qty 1

## 2020-12-17 MED ORDER — OXYCODONE HCL 5 MG PO TABS
5.0000 mg | ORAL_TABLET | ORAL | Status: DC | PRN
Start: 2020-12-17 — End: 2020-12-22
  Administered 2020-12-17 – 2020-12-19 (×8): 10 mg via ORAL
  Administered 2020-12-20: 5 mg via ORAL
  Administered 2020-12-20 – 2020-12-21 (×5): 10 mg via ORAL
  Administered 2020-12-21: 5 mg via ORAL
  Administered 2020-12-21 – 2020-12-22 (×5): 10 mg via ORAL
  Filled 2020-12-17 (×11): qty 2
  Filled 2020-12-17: qty 1
  Filled 2020-12-17 (×10): qty 2

## 2020-12-17 MED ORDER — POLYETHYLENE GLYCOL 3350 17 G PO PACK
17.0000 g | PACK | Freq: Every day | ORAL | Status: DC
  Administered 2020-12-17 – 2020-12-22 (×6): 17 g via ORAL
  Filled 2020-12-17 (×6): qty 1

## 2020-12-17 MED ORDER — SODIUM CHLORIDE 0.9% FLUSH
3.0000 mL | INTRAVENOUS | Status: DC | PRN
  Administered 2020-12-18: 3 mL via INTRAVENOUS

## 2020-12-17 MED ORDER — SODIUM CHLORIDE 0.9% FLUSH
3.0000 mL | Freq: Two times a day (BID) | INTRAVENOUS | Status: DC
  Administered 2020-12-17 – 2020-12-21 (×5): 3 mL via INTRAVENOUS

## 2020-12-17 NOTE — Progress Notes (Signed)
Inpatient Rehab Admissions Coordinator Note:   Per PT recommendation, pt was screened for CIR candidacy by Gayland Curry, MS, CCC-SLP. Noted pt unable to fully participate in PT d/t epidural.  Will continue to follow pt's progress and tolerance of therapies from a distance to help determine appropriateness of CIR.  Please contact me with questions.     Gayland Curry, [place], [place] Admissions Coordinator [phone] 12/17/20 12:58 PM

## 2020-12-17 NOTE — Progress Notes (Signed)
OT Cancellation Note  Patient Details Name: MALIEA GRANDMAISON MRN: [id number] DOB: [birth year]   Cancelled Treatment:    Reason Eval/Treat Not Completed: Other (comment) (Waiting for epidural to be removed. Checked in this AM and this PM. Has not been removed yet. Will return tomorrow for OT eval to maximize participation.)  [place], OTR/L Acute Rehab Pager: [phone] Office: [phone] 12/17/2020, 4:39 PM

## 2020-12-17 NOTE — Progress Notes (Signed)
Teg reviewed, normal. Epidural removed uneventfully at 17:10.

## 2020-12-17 NOTE — Progress Notes (Signed)
PT Cancellation Note  Patient Details Name: Stephanie [name] MRN: [id number] DOB: 1981/09/11   Cancelled Treatment:    Reason Eval/Treat Not Completed: Other (comment) Waiting for epidural to be removed. Checked in this AM and this PM. Has not been removed yet. Will return tomorrow in order to maximize participation.  Stephanie [name], PT, DPT Acute Rehabilitation Services Pager [phone] Office [phone]    Deno Etienne 12/17/2020, 4:52 PM

## 2020-12-17 NOTE — Anesthesia Post-op Follow-up Note (Addendum)
  Anesthesia Pain Follow-up Note  Patient: Stephanie [name]  Day #: 4  Date of Follow-up: 12/17/2020 Time: 11:36 AM  Last Vitals:  Vitals:   12/17/20 0400 12/17/20 0719  BP: 99/68 134/83  Pulse: 62 64  Resp: 15 13  Temp:  36.6 C  SpO2: 98% 99%    Level of Consciousness: alert  Pain: moderate incisional  Side Effects:Pruritis, patient refused benadryl overnight per nursing. Pruritis remains but reported erythematous rash most notable near catheter site improved   Catheter Site Exam:clean, dry, intact, no tenderness at the site     Plan: Plan to remove epidural per surgeon request. INR 1.5, plt 74 at early am lab draw. Will obtain global TEG to assess function prior to removal.   March Rummage Emmaline Wahba

## 2020-12-17 NOTE — Addendum Note (Signed)
Addendum  created 12/17/20 0727 by Wilburn Cornelia, CRNA   Order list changed

## 2020-12-17 NOTE — Progress Notes (Signed)
2200: Pt c/o itching and rash in back of neck and superior to epidural catheter site; pt stated she has been itching for a few days d/t sweating in the area. Upon assessment, site is red, no drainage. Pt was offered Benadryl, but refused. Pt was offered an ice pack to apply to the site; pt stated the itchiness decreased. Will continue to monitor site.

## 2020-12-17 NOTE — Plan of Care (Signed)
  Problem: Clinical Measurements: Goal: Ability to maintain clinical measurements within normal limits will improve Outcome: Progressing Goal: Postoperative complications will be avoided or minimized Outcome: Progressing   

## 2020-12-17 NOTE — Progress Notes (Addendum)
   General Surgery Follow Up Note  Subjective:    Overnight Issues:  Still having numb legs from epidural, difficult to mobilize.  Objective:  Vital signs for last 24 hours: Temp:  [97.8 F (36.6 C)-98.4 F (36.9 C)] 97.9 F (36.6 C) (03/21 0719) Pulse Rate:  [62-84] 64 (03/21 0719) Resp:  [13-16] 13 (03/21 0719) BP: (99-134)/(68-85) 134/83 (03/21 0719) SpO2:  [92 %-100 %] 99 % (03/21 0719)  Intake/Output from previous day: 03/20 0701 - 03/21 0700 In: 2893.7 [P.O.:1070; I.V.:1643.7; IV Piggyback:50] Out: 5462 [Urine:3050; Drains:105]  Intake/Output this shift: Total I/O In: 291.4 [I.V.:291.4] Out: -   Vent settings for last 24 hours:    Physical Exam:  Gen: comfortable, no distress Neuro: non-focal exam HEENT: PERRL, anicteric Neck: supple Pulm: unlabored breathing Abd: soft, approriately TTP, incision c/d/i with staples, drain bile stain, small amount of stool in bag, lots of gas.  GU: clear yellow urine Extr: wwp, no edema   No results found for this or any previous visit (from the past 24 hour(s)).  Assessment & Plan: The plan of care was discussed with the bedside nurse for the day, who is in agreement with this plan and no additional concerns were raised.   Present on Admission: . Colon cancer ([place]) . Metastatic colon cancer to liver ([place])    LOS: 4 days   Additional comments:I reviewed the patient's new clinical lab test results.   and I reviewed the patients new imaging test results.    s/pProcedure(s) with comments: OPEN PARTIAL HEPATECTOMY (N/A) -  LAPAROSCOPY DIAGNOSTIC (N/A) INTRAOPERATIVE LIVER ULTRASOUND (N/A) For metastatic colon cancer- pathology pending.  D/c epidural and foley afterward. Regular diet.  Change to tablet oxycodone  JP to remain for now Encourage IS/ambulation  Hopefully home vs rehab depending on how therapy goes once epidural is out.  Milus Height, MD FACS Surgical Oncology, General Surgery, Trauma and  [facility], [place] for weekday/non holidays Check [email or web address] for coverage night/weekend/holidays  Do not use SecureChat as it is not reliable for timely patient care.     12/17/2020  *Care during the described time interval was provided by me. I have reviewed this patient's available data, including medical history, events of note, physical examination and test results as part of my evaluation.

## 2020-12-18 LAB — COMPREHENSIVE METABOLIC PANEL
ALT: 99 U/L — ABNORMAL HIGH (ref 0–44)
AST: 46 U/L — ABNORMAL HIGH (ref 15–41)
Albumin: 3.1 g/dL — ABNORMAL LOW (ref 3.5–5.0)
Alkaline Phosphatase: 82 U/L (ref 38–126)
Anion gap: 7 (ref 5–15)
BUN: 10 mg/dL (ref 6–20)
CO2: 27 mmol/L (ref 22–32)
Calcium: 8.8 mg/dL — ABNORMAL LOW (ref 8.9–10.3)
Chloride: 101 mmol/L (ref 98–111)
Creatinine, Ser: 0.56 mg/dL (ref 0.44–1.00)
GFR, Estimated: >60 mL/min (ref >60)
Glucose, Bld: 98 mg/dL (ref 70–99)
Potassium: 4.3 mmol/L (ref 3.5–5.1)
Sodium: 135 mmol/L (ref 135–145)
Total Bilirubin: 1 mg/dL (ref 0.3–1.2)
Total Protein: 5.9 g/dL — ABNORMAL LOW (ref 6.5–8.1)

## 2020-12-18 LAB — CBC
HCT: 36.2 % (ref 36.0–46.0)
Hemoglobin: 12.4 g/dL (ref 12.0–15.0)
MCH: 32.7 pg (ref 26.0–34.0)
MCHC: 34.3 g/dL (ref 30.0–36.0)
MCV: 95.5 fL (ref 80.0–100.0)
Platelets: 101 10*3/uL — ABNORMAL LOW (ref 150–400)
RBC: 3.79 MIL/uL — ABNORMAL LOW (ref 3.87–5.11)
RDW: 13.1 % (ref 11.5–15.5)
WBC: 6.7 10*3/uL (ref 4.0–10.5)
nRBC: 0 % (ref 0.0–0.2)

## 2020-12-18 NOTE — Progress Notes (Signed)
Physical Therapy Treatment Patient Details Name: Stephanie [name] MRN: [id number] DOB: [birth year] Today's Date: 12/18/2020    History of Present Illness The patient is a 40 year old female who presents for a follow-up for Colorectal cancer. Pt underwent OPEN PARTIAL HEPATECTOMY 3/17 and an epidural was placed for pain control and was still in place 3/20 on PT eval. PMH: Colon Cancer diagnosed in 03/12/2020.Pt had surgery 04/04/2020 -  open sigmoid colectomy with ostomy and wound left open.  She also had a liver mass that looked like it might be benign on imaging, but has since been biopsied and is positive for metastatic disease.  The patient got a port and was d/c 04/16/20. She had 6 months of chemotherapy.Restaging scans 2/11 showed continued decrease in the size of the mass.   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    PT Comments    Pt with improved lower extremity strength and sensation now that epidural has been discontinued, but she continues to display generalized weakness in her lower extremities, especially on her R. This impacts her stability and safety with standing mobility as pt needing minAx2 for safety with bil HHA to ambulate short bedroom distances. Pt fatigues quickly and displays trunk sway and bil knee instability that places her at risk for falls. At this time she benefits from light bil UE support for standing mobility and thus could use a RW at this time. Will continue to follow acutely. Will continue to assess pt's progress to determine need for CIR or HH PT upon d/c.    Follow Up Recommendations  CIR (vs HHPT based on pt progression)     Equipment Recommendations  Rolling walker with 5" wheels;3in1 (PT) (may change with progression)    Recommendations for Other Services Rehab consult     Precautions / Restrictions Precautions Precautions: Fall Restrictions Weight Bearing Restrictions: No    Mobility  Bed Mobility Overal bed mobility: Needs  Assistance Bed Mobility: Supine to Sit     Supine to sit: Min guard;HOB elevated     General bed mobility comments: Pt able to walk legs towards EOB and pivot on buttocks as pt reports it would be too painful to roll onto R shoulder this date. Pt utilizing bed controls and rails with min guard for safety. Extra time and effort.    Transfers Overall transfer level: Needs assistance Equipment used: 2 person hand held assist Transfers: Sit to/from Stand Sit to Stand: Min assist;+2 physical assistance;+2 safety/equipment         General transfer comment: MinA for steadying due to trunk sway noted.  Ambulation/Gait Ambulation/Gait assistance: Min assist;+2 physical assistance;+2 safety/equipment Gait Distance (Feet): 40 Feet Assistive device: 2 person hand held assist Gait Pattern/deviations: Step-through pattern;Decreased step length - right;Decreased step length - left;Decreased stride length Gait velocity: reduced Gait velocity interpretation: <1.31 ft/sec, indicative of household ambulator General Gait Details: Pt with slow, unsteady gait, demonstrating bil knee instability and trunk sway. Pt benefiting from bil HHA for support and steadying, minA. No overt LOB, but pt fatigues quickly.   Stairs             Wheelchair Mobility    Modified Rankin (Stroke Patients Only)       Balance Overall balance assessment: Needs assistance Sitting-balance support: No upper extremity supported;Feet supported Sitting balance-Leahy Scale: Good Sitting balance - Comments: Pt able to donn socks and underwear with supervision. Extra time needed.   Standing balance support: Bilateral upper extremity supported  Standing balance-Leahy Scale: Poor Standing balance comment: Benefits from bil UE support due to trunk sway and leg weakness.                            Cognition Arousal/Alertness: Awake/alert Behavior During Therapy: Flat affect Overall Cognitive Status: Within  Functional Limits for tasks assessed                                        Exercises      General Comments General comments (skin integrity, edema, etc.): BP responding appropriately with changes in position other than slight decline coming to stand, but not significant and no symptoms reported; pt with sudden, heavy menstrual cycle beginning during session; MMT scores this date = hip flexion 3- bil, knee extension 4- L 3+ R, knee flexion 3+ bil, ankle dorsiflexion 3+ L 3 R      Pertinent Vitals/Pain Pain Assessment: 0-10 Pain Score: 7  Pain Location: right top of shoulder between neck and shoulder. 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   PT Goals (current goals can now be found in the care plan section) Acute Rehab PT Goals Patient Stated Goal: to be able to walk normally PT Goal Formulation: With patient Time For Goal Achievement: 12/30/20 Potential to Achieve Goals: Good Progress towards PT goals: Progressing toward goals    Frequency    Min 3X/week      PT Plan Discharge plan needs to be updated    Co-evaluation PT/OT/SLP Co-Evaluation/Treatment: Yes Reason for Co-Treatment: For patient/therapist safety;To address functional/ADL transfers PT goals addressed during session: Mobility/safety with mobility;Balance OT goals addressed during session:  Strengthening/ROM;ADL's and self-care      AM-PAC PT "6 Clicks" Mobility   Outcome Measure  Help needed turning from your back to your side while in a flat bed without using bedrails?: A Little Help needed moving from lying on your back to sitting on the side of a flat bed without using bedrails?: A Little Help needed moving to and from a bed to a chair (including a wheelchair)?: A Little Help needed standing up from a chair using your arms (e.g., wheelchair or bedside chair)?: A Little Help needed to walk in hospital room?: A Little Help needed climbing 3-5 steps with a railing? : A Lot 6 Click Score: 17    End of Session Equipment Utilized During Treatment: Gait belt Activity Tolerance: Patient limited by pain;Patient limited by fatigue Patient left: in chair;with call bell/phone within reach;with chair alarm set Nurse Communication: Mobility status;Other (comment) (menstrual cycle) PT Visit Diagnosis: Muscle weakness (generalized) (M62.81);Unsteadiness on feet (R26.81);Other abnormalities of gait and mobility (R26.89);Difficulty in walking, not elsewhere classified (R26.2)     Time: 0177-9390 PT Time Calculation (min) (ACUTE ONLY): 33 min  Charges:  $Therapeutic Activity: 8-22 mins  Moishe Spice, PT, DPT Acute Rehabilitation Services  Pager: [phone] Office: [place] 12/18/2020, 12:34 PM

## 2020-12-18 NOTE — Plan of Care (Signed)
  Problem: Clinical Measurements: Goal: Ability to maintain clinical measurements within normal limits will improve Outcome: Progressing   

## 2020-12-18 NOTE — Plan of Care (Signed)
  Problem: Clinical Measurements: Goal: Ability to maintain clinical measurements within normal limits will improve 12/18/2020 1232 by Emmaline Life, RN Outcome: Progressing 12/18/2020 1231 by Emmaline Life, RN Outcome: Progressing Goal: Postoperative complications will be avoided or minimized Outcome: Progressing

## 2020-12-18 NOTE — Progress Notes (Signed)
   General Surgery Follow Up Note  Subjective:    Overnight Issues:  A bit more sore, especially right back.  Epidural pulled.    Objective:  Vital signs for last 24 hours: Temp:  [97.6 F (36.4 C)-98.1 F (36.7 C)] 97.6 F (36.4 C) (03/22 1125) Pulse Rate:  [62-79] 70 (03/22 1125) Resp:  [13-18] 13 (03/22 1125) BP: (111-123)/(63-82) 111/73 (03/22 1125) SpO2:  [95 %-99 %] 98 % (03/22 1125)  Intake/Output from previous day: 03/21 0701 - 03/22 0700 In: 1704.4 [P.O.:1410; I.V.:294.4] Out: 3010 [Urine:2850; Drains:160]  Intake/Output this shift: Total I/O In: 300 [P.O.:300] Out: 730 [Urine:650; Drains:80]  Physical Exam:  Gen: comfortable, no distress Neuro: non-focal exam Neck: supple Pulm: unlabored breathing Abd: soft, approriately TTP, incision c/d/i with staples, drain bile stain, small amount of stool in bag, lots of gas.  Extr: wwp, no edema   Results for orders placed or performed during the hospital encounter of 12/13/20 (from the past 24 hour(s))  Global TEG Panel     Status: Abnormal   Collection Time: 12/17/20  2:29 PM  Result Value Ref Range   Citrated Kaolin (R) 4.7 4.6 - 9.1 min   Citrated Kaolin (K) 0.9 0.8 - 2.1 min   Citrated Kaolin Angle 77.1 63 - 78 deg   Citrated Kaolin (MA) 69 52 - 69 mm   Citrated Rapid TEG (MA) 65.9 52 - 70 mm   CK with Heparinase (R) 4.8 4.3 - 8.3 min   CFF Max Amplitude 32.3 (H) 15 - 32 mm   Citrated Functional Fibrinogen 589.4 (H) 278 - 581 mg/dL  CBC     Status: Abnormal   Collection Time: 12/18/20 11:46 AM  Result Value Ref Range   WBC 6.7 4.0 - 10.5 K/uL   RBC 3.79 (L) 3.87 - 5.11 MIL/uL   Hemoglobin 12.4 12.0 - 15.0 g/dL   HCT 36.2 36.0 - 46.0 %   MCV 95.5 80.0 - 100.0 fL   MCH 32.7 26.0 - 34.0 pg   MCHC 34.3 30.0 - 36.0 g/dL   RDW 13.1 11.5 - 15.5 %   Platelets 101 (L) 150 - 400 K/uL   nRBC 0.0 0.0 - 0.2 %    Assessment & Plan: The plan of care was discussed with the bedside nurse for the day, who is in  agreement with this plan and no additional concerns were raised.   Present on Admission: . Colon cancer ([place]) . Metastatic colon cancer to liver ([place])    LOS: 5 days   Additional comments:I reviewed the patient's new clinical lab test results.   and I reviewed the patients new imaging test results.    s/pProcedure(s) with comments: OPEN PARTIAL HEPATECTOMY (N/A) -  LAPAROSCOPY DIAGNOSTIC (N/A) INTRAOPERATIVE LIVER ULTRASOUND (N/A) For metastatic colon cancer- pathology pending.   Regular diet.  Oxycodone, prn robaxin and tramadol.    JP to remain for now, watch amount and character of output.   Encourage IS/ambulation Hopefully home in next few days.     Milus Height, MD FACS Surgical Oncology, General Surgery, Trauma and [facility] [facility], [place] for weekday/non holidays Check [email or web address] for coverage night/weekend/holidays  Do not use SecureChat as it is not reliable for timely patient care.     12/18/2020  *Care during the described time interval was provided by me. I have reviewed this patient's available data, including medical history, events of note, physical examination and test results as part of my evaluation.

## 2020-12-18 NOTE — Evaluation (Signed)
Occupational Therapy Evaluation Patient Details Name: Stephanie [name] MRN: [id number] DOB: [birth year] Today's Date: 12/18/2020    History of Present Illness The patient is a 40 year old female who presents for a follow-up for Colorectal cancer. Pt underwent OPEN PARTIAL HEPATECTOMY 3/17 and an epidural was placed for pain control and was still in place 3/20 on PT eval. PMH: Colon Cancer diagnosed in 03/12/2020.Pt had surgery 04/04/2020 -  open sigmoid colectomy with ostomy and wound left open.  She also had a liver mass that looked like it might be benign on imaging, but has since been biopsied and is positive for metastatic disease.  The patient got a port and was d/c 04/16/20. She had 6 months of chemotherapy.Restaging scans 2/11 showed continued decrease in the size of the mass.   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   Clinical Impression   This 40 yo female admitted with above presents to acute OT with PLOF of being totally independent with basic ADLs and IADLs. Currently she is setup-min A for basic ADLs due to decreased balance and right shoulder pain. She will continue to benefit from acute OT without need for follow up.     Follow Up Recommendations  No OT follow up;Supervision - Intermittent    Equipment Recommendations  Tub/shower seat (pt is Medicaid)       Precautions / Restrictions Precautions Precautions: Fall Restrictions Weight Bearing Restrictions: No      Mobility Bed Mobility Overal bed mobility: Needs Assistance Bed Mobility: Supine to Sit     Supine to sit: Min guard;HOB elevated          Transfers Overall transfer level: Needs assistance Equipment used: 2 person hand held assist Transfers: Sit to/from Stand Sit to Stand: Min assist;+2 physical assistance              Balance Overall balance assessment: Needs assistance Sitting-balance support: No upper extremity supported;Feet supported Sitting balance-Leahy Scale: Good      Standing balance support: Bilateral upper extremity supported Standing balance-Leahy Scale: Poor                             ADL either performed or assessed with clinical judgement   ADL Overall ADL's : Needs assistance/impaired Eating/Feeding: Independent;Sitting   Grooming: Set up;Sitting   Upper Body Bathing: Set up;Sitting   Lower Body Bathing: Minimal assistance;Sit to/from stand   Upper Body Dressing : Set up;Sitting   Lower Body Dressing: Minimal assistance;Sit to/from stand Lower Body Dressing Details (indicate cue type and reason): Can cross legs to get to feet Toilet Transfer: Minimal assistance;+2 for physical assistance;+2 for safety/equipment;Ambulation Toilet Transfer Details (indicate cue type and reason): Bil HHA, bed>around to door>backt to recliner other side of bed Toileting- Clothing Manipulation and Hygiene: Minimal assistance;Sit to/from stand Toileting - Clothing Manipulation Details (indicate cue type and reason): Needed A for front and back peri care due to heavy period flow (has not had one in 6 months)             Vision Patient Visual Report: No change from baseline              Pertinent Vitals/Pain Pain Assessment: 0-10 Pain Score: 7  Pain Location: right top of shoulder between neck and shoulder. Pain Descriptors / Indicators: Constant;Dull;Sore Pain Intervention(s): Limited activity within patient's tolerance;Monitored during session;Heat applied;Other (comment) (massage)     Hand Dominance Right   Extremity/Trunk  Assessment Upper Extremity Assessment Upper Extremity Assessment: Overall WFL for tasks assessed           Communication Communication Communication: No difficulties   Cognition Arousal/Alertness: Awake/alert Behavior During Therapy: Flat affect Overall Cognitive Status: Within Functional Limits for tasks assessed                                                Home Living  Family/patient expects to be discharged to:: Private residence Living Arrangements: Children;Parent Available Help at Discharge: Family;Available PRN/intermittently (alone during day) Type of Home: House Home Access: Stairs to enter [employer] of Steps: 2 Entrance Stairs-Rails: Right Home Layout: One level     Bathroom Shower/Tub: Corporate investment banker: Standard     Home Equipment: None          Prior Functioning/Environment Level of Independence: Independent        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        OT Problem List: Decreased strength;Impaired balance (sitting and/or standing);Pain      OT Treatment/Interventions: Self-care/ADL training;DME and/or AE instruction;Patient/family education;Balance training    OT Goals(Current goals can be found in the care plan section) Acute Rehab OT Goals Patient Stated Goal: to be able to walk normally OT Goal Formulation: With patient Time For Goal Achievement: 12/31/20 Potential to Achieve Goals: Good ADL Goals Pt Will Perform Grooming: Independently;standing Pt Will Perform Upper Body Bathing: Independently;sitting Pt Will Perform Lower Body Bathing: Independently;sit to/from stand Pt Will Perform Upper Body Dressing: Independently;sitting Pt Will Perform Lower Body Dressing: Independently;sit to/from stand Pt Will Transfer to Toilet: with modified independence;ambulating;grab bars Pt Will Perform Toileting - Clothing Manipulation and hygiene: Independently;sit to/from stand Pt Will Perform Tub/Shower Transfer: Tub transfer;with modified independence;ambulating;shower seat;rolling walker Additional ADL Goal #1: Pt will be Independent in and OOB for basic ADLs  OT Frequency: Min 2X/week           Co-evaluation PT/OT/SLP Co-Evaluation/Treatment: Yes Reason for Co-Treatment: For patient/therapist safety;To address functional/ADL transfers PT  goals addressed during session: Mobility/safety with mobility;Balance;Strengthening/ROM OT goals addressed during session: Strengthening/ROM;ADL's and self-care      AM-PAC OT "6 Clicks" Daily Activity     Outcome Measure Help from another person eating meals?: None Help from another person taking care of personal grooming?: A Little Help from another person toileting, which includes using toliet, bedpan, or urinal?: A Little Help from another person bathing (including washing, rinsing, drying)?: A Little Help from another person to put on and taking off regular upper body clothing?: A Little Help from another person to put on and taking off regular lower body clothing?: A Little 6 Click Score: 19   End of Session Equipment Utilized During Treatment: Gait belt Nurse Communication: Mobility status (heavy flow period)  Activity Tolerance: Patient tolerated treatment well Patient left: in chair;with call bell/phone within reach;with chair alarm set  OT Visit Diagnosis: Unsteadiness on feet (R26.81);Other abnormalities of gait and mobility (R26.89);Pain;Muscle weakness (generalized) (M62.81) Pain - Right/Left: Right Pain - part of body: Shoulder                Time: 5784-6962 OT Time Calculation (min): 33 min Charges:  OT General Charges $OT Visit: 1 Visit OT Evaluation $OT Eval Moderate Complexity: 1 Mod  .[place], OTR/L Acute  Rehab Services Pager [phone] Office [phone]     Almon Register 12/18/2020, 10:03 AM

## 2020-12-18 NOTE — Progress Notes (Signed)
Inpatient Rehabilitation Admissions Coordinator  Patient has progressed with therapy. OT recommends no OT follow up and PT sees improvement today. Patient not in need of CIR admit at this time with one therapy need. Likely can progress over the next couple days. I will not place a rehab consult at this time.  Danne Baxter, RN, MSN Rehab Admissions Coordinator [phone] 12/18/2020 1:29 PM

## 2020-12-19 ENCOUNTER — Inpatient Hospital Stay (HOSPITAL_COMMUNITY): Payer: Medicaid Other

## 2020-12-19 LAB — COMPREHENSIVE METABOLIC PANEL
ALT: 83 U/L — ABNORMAL HIGH (ref 0–44)
AST: 41 U/L (ref 15–41)
Albumin: 2.8 g/dL — ABNORMAL LOW (ref 3.5–5.0)
Alkaline Phosphatase: 80 U/L (ref 38–126)
Anion gap: 6 (ref 5–15)
BUN: 11 mg/dL (ref 6–20)
CO2: 27 mmol/L (ref 22–32)
Calcium: 8.7 mg/dL — ABNORMAL LOW (ref 8.9–10.3)
Chloride: 102 mmol/L (ref 98–111)
Creatinine, Ser: 0.53 mg/dL (ref 0.44–1.00)
GFR, Estimated: >60 mL/min (ref >60)
Glucose, Bld: 101 mg/dL — ABNORMAL HIGH (ref 70–99)
Potassium: 3.8 mmol/L (ref 3.5–5.1)
Sodium: 135 mmol/L (ref 135–145)
Total Bilirubin: 1 mg/dL (ref 0.3–1.2)
Total Protein: 5.5 g/dL — ABNORMAL LOW (ref 6.5–8.1)

## 2020-12-19 LAB — CBC
HCT: 34.2 % — ABNORMAL LOW (ref 36.0–46.0)
Hemoglobin: 11.8 g/dL — ABNORMAL LOW (ref 12.0–15.0)
MCH: 33 pg (ref 26.0–34.0)
MCHC: 34.5 g/dL (ref 30.0–36.0)
MCV: 95.5 fL (ref 80.0–100.0)
Platelets: 107 10*3/uL — ABNORMAL LOW (ref 150–400)
RBC: 3.58 MIL/uL — ABNORMAL LOW (ref 3.87–5.11)
RDW: 13.2 % (ref 11.5–15.5)
WBC: 6.7 10*3/uL (ref 4.0–10.5)
nRBC: 0 % (ref 0.0–0.2)

## 2020-12-19 MED ORDER — IOHEXOL 9 MG/ML PO SOLN
ORAL | Status: AC
  Filled 2020-12-19: qty 1000

## 2020-12-19 MED ORDER — IOHEXOL 300 MG/ML  SOLN
100.0000 mL | Freq: Once | INTRAMUSCULAR | Status: AC | PRN
  Administered 2020-12-19: 100 mL via INTRAVENOUS

## 2020-12-19 MED ORDER — SODIUM CHLORIDE 0.9 % IV SOLN: INTRAVENOUS | Status: DC

## 2020-12-19 MED ORDER — BISACODYL 5 MG PO TBEC
10.0000 mg | DELAYED_RELEASE_TABLET | Freq: Once | ORAL | Status: AC
  Administered 2020-12-19: 10 mg via ORAL
  Filled 2020-12-19: qty 2

## 2020-12-19 MED ORDER — MAGNESIUM HYDROXIDE 400 MG/5ML PO SUSP
30.0000 mL | Freq: Every day | ORAL | Status: DC | PRN
  Administered 2020-12-19 – 2020-12-20 (×2): 30 mL via ORAL
  Filled 2020-12-19 (×2): qty 30

## 2020-12-19 NOTE — Progress Notes (Signed)
Pt off unit for CT of abdomen.  Justice Rocher, RN

## 2020-12-19 NOTE — Progress Notes (Signed)
General Surgery Follow Up Note  Subjective:    Overnight Issues:  Continues to have increasing right back pain.  Poor PO intake.  Hasn't really had good BM, just a small amount of liquid stool a few days ago.    Objective:  Vital signs for last 24 hours: Temp:  [97.6 F (36.4 C)-98.3 F (36.8 C)] 98.3 F (36.8 C) (03/23 0720) Pulse Rate:  [67-78] 76 (03/23 0720) Resp:  [12-18] 15 (03/23 0720) BP: (104-120)/(60-79) 116/79 (03/23 0720) SpO2:  [96 %-99 %] 98 % (03/23 0720)  Intake/Output from previous day: 03/22 0701 - 03/23 0700 In: 300 [P.O.:300] Out: 760 [Urine:650; Drains:110]  Intake/Output this shift: No intake/output data recorded.  Physical Exam:  Gen: comfortable, looks more uncomfortable than the past few days.   Neuro: non-focal exam Neck: supple Pulm: unlabored breathing Abd: soft, approriately TTP, incision c/d/i with staples, drain bile stain, no significant stool in bag, small amount of gas.  Extr: wwp, no edema   Results for orders placed or performed during the hospital encounter of 12/13/20 (from the past 24 hour(s))  CBC     Status: Abnormal   Collection Time: 12/18/20 11:46 AM  Result Value Ref Range   WBC 6.7 4.0 - 10.5 K/uL   RBC 3.79 (L) 3.87 - 5.11 MIL/uL   Hemoglobin 12.4 12.0 - 15.0 g/dL   HCT 36.2 36.0 - 46.0 %   MCV 95.5 80.0 - 100.0 fL   MCH 32.7 26.0 - 34.0 pg   MCHC 34.3 30.0 - 36.0 g/dL   RDW 13.1 11.5 - 15.5 %   Platelets 101 (L) 150 - 400 K/uL   nRBC 0.0 0.0 - 0.2 %  Comprehensive metabolic panel     Status: Abnormal   Collection Time: 12/18/20 11:46 AM  Result Value Ref Range   Sodium 135 135 - 145 mmol/L   Potassium 4.3 3.5 - 5.1 mmol/L   Chloride 101 98 - 111 mmol/L   CO2 27 22 - 32 mmol/L   Glucose, Bld 98 70 - 99 mg/dL   BUN 10 6 - 20 mg/dL   Creatinine, Ser 0.56 0.44 - 1.00 mg/dL   Calcium 8.8 (L) 8.9 - 10.3 mg/dL   Total Protein 5.9 (L) 6.5 - 8.1 g/dL   Albumin 3.1 (L) 3.5 - 5.0 g/dL   AST 46 (H) 15 - 41 U/L   ALT  99 (H) 0 - 44 U/L   Alkaline Phosphatase 82 38 - 126 U/L   Total Bilirubin 1.0 0.3 - 1.2 mg/dL   GFR, Estimated >60 >60 mL/min   Anion gap 7 5 - 15  CBC     Status: Abnormal   Collection Time: 12/19/20  3:21 AM  Result Value Ref Range   WBC 6.7 4.0 - 10.5 K/uL   RBC 3.58 (L) 3.87 - 5.11 MIL/uL   Hemoglobin 11.8 (L) 12.0 - 15.0 g/dL   HCT 34.2 (L) 36.0 - 46.0 %   MCV 95.5 80.0 - 100.0 fL   MCH 33.0 26.0 - 34.0 pg   MCHC 34.5 30.0 - 36.0 g/dL   RDW 13.2 11.5 - 15.5 %   Platelets 107 (L) 150 - 400 K/uL   nRBC 0.0 0.0 - 0.2 %  Comprehensive metabolic panel     Status: Abnormal   Collection Time: 12/19/20  3:21 AM  Result Value Ref Range   Sodium 135 135 - 145 mmol/L   Potassium 3.8 3.5 - 5.1 mmol/L   Chloride 102 98 -  111 mmol/L   CO2 27 22 - 32 mmol/L   Glucose, Bld 101 (H) 70 - 99 mg/dL   BUN 11 6 - 20 mg/dL   Creatinine, Ser 0.53 0.44 - 1.00 mg/dL   Calcium 8.7 (L) 8.9 - 10.3 mg/dL   Total Protein 5.5 (L) 6.5 - 8.1 g/dL   Albumin 2.8 (L) 3.5 - 5.0 g/dL   AST 41 15 - 41 U/L   ALT 83 (H) 0 - 44 U/L   Alkaline Phosphatase 80 38 - 126 U/L   Total Bilirubin 1.0 0.3 - 1.2 mg/dL   GFR, Estimated >60 >60 mL/min   Anion gap 6 5 - 15    Assessment & Plan: The plan of care was discussed with the bedside nurse for the day, who is in agreement with this plan and no additional concerns were raised.   Present on Admission: . Colon cancer ([place]) . Metastatic colon cancer to liver ([place])    LOS: 6 days   Additional comments:I reviewed the patient's new clinical lab test results.   and I reviewed the patients new imaging test results.    s/pProcedure(s) with comments: OPEN PARTIAL HEPATECTOMY (N/A) -  LAPAROSCOPY DIAGNOSTIC (N/A) INTRAOPERATIVE LIVER ULTRASOUND (N/A) For metastatic colon cancer- Margins negative, 80% tumor necrotic.     Regular diet.  Oxycodone, prn robaxin and tramadol.  IV dilaudid for breakthrough.   Increasing right back pain, will get CT to evaluate for  undrained fluid collection. Add back some IV fluids given that patient is only eating around 1/3 of her meals.   Increase bowel regimen with dulcolax and milk of mag if no results.    JP to remain for now, watch amount and character of output.   Encourage IS/ambulation Transfer to floor status or 6 N Hopefully home in next few days.     Milus Height, MD FACS Surgical Oncology, General Surgery, Trauma and [place] [facility], [place] for weekday/non holidays Check [email or web address] for coverage night/weekend/holidays  Do not use SecureChat as it is not reliable for timely patient care.     12/19/2020  *Care during the described time interval was provided by me. I have reviewed this patient's available data, including medical history, events of note, physical examination and test results as part of my evaluation.

## 2020-12-19 NOTE — Progress Notes (Signed)
Pt back from CT.   Justice Rocher, RN

## 2020-12-19 NOTE — Progress Notes (Addendum)
Physical Therapy Treatment Patient Details Name: Stephanie [name] MRN: [id number] DOB: 01/28/1981 Today's Date: 12/19/2020    History of Present Illness The patient is a 40 year old female who presents for a follow-up for Colorectal cancer. Pt underwent OPEN PARTIAL HEPATECTOMY 3/17 and an epidural was placed for pain control and was still in place 3/20 on PT eval. PMH: Colon Cancer diagnosed in 03/12/2020.Pt had surgery 04/04/2020 -  open sigmoid colectomy with ostomy and wound left open.  She also had a liver mass that looked like it might be benign on imaging, but has since been biopsied and is positive for metastatic disease.  The patient got a port and was d/c 04/16/20. She had 6 months of chemotherapy.Restaging scans 2/11 showed continued decrease in the size of the mass.   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    PT Comments    Pt continuing to make good and steady progress with mobility, ambulating short household distances with only min guard when utilizing a RW for bil UE support this date. The RW provides the pt with improved stability and safety with gait as when she tries to ambulate without it her trunk sway increases and speed decreases, placing her at risk for falls and injury. Pt needing cues for proper hand placement with transfers to ensure safe use of RW. Changed d/c recs to [facility] PT as pt will likely continue to make good progress and appears to understand her limitations, allowing for pt to safely ambulate short distances with a RW while alone at home when mom is at work. Will continue to follow acutely. Of note, pt with R shoulder pain that limits her mobility. Pain reproduced with R upper trapezius palpation, thus provided soft tissue mobilization and educated pt on self-stretches to decrease pain.    Follow Up Recommendations  Home health PT     Equipment Recommendations  Rolling walker with 5" wheels;3in1 (PT) (may change with progression)    Recommendations  for Other Services       Precautions / Restrictions Precautions Precautions: Fall Restrictions Weight Bearing Restrictions: No    Mobility  Bed Mobility Overal bed mobility: Needs Assistance Bed Mobility: Supine to Sit     Supine to sit: Min guard     General bed mobility comments: Pt able to walk legs towards EOB and pivot on buttocks while using rails to ascend trunk to sit EOB with bed flat. Min guard for safety. Extra time and effort.    Transfers Overall transfer level: Needs assistance Equipment used: Rolling walker (2 wheeled) Transfers: Sit to/from Stand Sit to Stand: Min guard         General transfer comment: Min guard for safety, no overt LOB. Cues provided to place at least 1 UE on sitting surface prior to coming to stand or returning to sit for safety, good carryover.  Ambulation/Gait Ambulation/Gait assistance: Min guard Gait Distance (Feet): 40 Feet (x2 bouts) Assistive device: Rolling walker (2 wheeled);None Gait Pattern/deviations: Step-through pattern;Decreased step length - right;Decreased step length - left;Decreased stride length Gait velocity: reduced Gait velocity interpretation: <1.31 ft/sec, indicative of household ambulator General Gait Details: Pt ambulating with no LOB or trunk sway when using RW but when RW removed and attempting to ambulate without UE support she was even slower and demonstrated increased trunk sway/unsteadiness. Min guard for safety.   Stairs             Wheelchair Mobility    Modified Rankin (Stroke  Patients Only)       Balance Overall balance assessment: Needs assistance Sitting-balance support: No upper extremity supported;Feet supported Sitting balance-Leahy Scale: Good     Standing balance support: Bilateral upper extremity supported;No upper extremity supported Standing balance-Leahy Scale: Fair Standing balance comment: Benefits from bil UE support with mobility, but is capable of standing without  UE support but with increased unsteadiness noted.                            Cognition Arousal/Alertness: Awake/alert Behavior During Therapy: Flat affect Overall Cognitive Status: Within Functional Limits for tasks assessed                                        Exercises Other Exercises Other Exercises: 1x self-stretch R upper trapezius    General Comments General comments (skin integrity, edema, etc.): Pain in shoulder reproduced with deep palpation of R upper trapezius muscle, thus provided gentle manual soft tissue mobilization to muscle and directed pt on self-stretch of R upper trapezius      Pertinent Vitals/Pain Pain Assessment: Faces Faces Pain Scale: Hurts even more Pain Location: right top of shoulder between neck and shoulder. Pain Descriptors / Indicators: Constant;Sore;Grimacing;Guarding Pain Intervention(s): Limited activity within patient's tolerance;Monitored during session;Repositioned;Utilized relaxation techniques (massage and self-stretch to R upper trapezius)    Home Living                      Prior Function            PT Goals (current goals can now be found in the care plan section) Acute Rehab PT Goals Patient Stated Goal: to decrease R shoulder pain and improve PT Goal Formulation: With patient Time For Goal Achievement: 12/30/20 Potential to Achieve Goals: Good Progress towards PT goals: Progressing toward goals    Frequency    Min 3X/week      PT Plan Discharge plan needs to be updated    Co-evaluation              AM-PAC PT "6 Clicks" Mobility   Outcome Measure  Help needed turning from your back to your side while in a flat bed without using bedrails?: A Little Help needed moving from lying on your back to sitting on the side of a flat bed without using bedrails?: A Little Help needed moving to and from a bed to a chair (including a wheelchair)?: A Little Help needed standing up from a  chair using your arms (e.g., wheelchair or bedside chair)?: A Little Help needed to walk in hospital room?: A Little Help needed climbing 3-5 steps with a railing? : A Lot 6 Click Score: 17    End of Session Equipment Utilized During Treatment: Gait belt Activity Tolerance: Patient limited by pain;Patient tolerated treatment well Patient left: in chair;with call bell/phone within reach;with chair alarm set Nurse Communication: Mobility status;Other (comment) (nausea) PT Visit Diagnosis: Muscle weakness (generalized) (M62.81);Unsteadiness on feet (R26.81);Other abnormalities of gait and mobility (R26.89);Difficulty in walking, not elsewhere classified (R26.2)     Time: 5638-7564 PT Time Calculation (min) (ACUTE ONLY): 26 min  Charges:  $Gait Training: 8-22 mins $Therapeutic Activity: 8-22 mins                     Moishe Spice, PT, DPT Acute Rehabilitation Services  Pager:  [phone] Office: [place] 12/19/2020, 12:24 PM

## 2020-12-20 ENCOUNTER — Inpatient Hospital Stay (HOSPITAL_COMMUNITY): Payer: Medicaid Other

## 2020-12-20 LAB — CBC
HCT: 32.7 % — ABNORMAL LOW (ref 36.0–46.0)
Hemoglobin: 10.8 g/dL — ABNORMAL LOW (ref 12.0–15.0)
MCH: 32 pg (ref 26.0–34.0)
MCHC: 33 g/dL (ref 30.0–36.0)
MCV: 96.7 fL (ref 80.0–100.0)
Platelets: 112 10*3/uL — ABNORMAL LOW (ref 150–400)
RBC: 3.38 MIL/uL — ABNORMAL LOW (ref 3.87–5.11)
RDW: 13.2 % (ref 11.5–15.5)
WBC: 5.8 10*3/uL (ref 4.0–10.5)
nRBC: 0 % (ref 0.0–0.2)

## 2020-12-20 LAB — COMPREHENSIVE METABOLIC PANEL
ALT: 75 U/L — ABNORMAL HIGH (ref 0–44)
AST: 37 U/L (ref 15–41)
Albumin: 2.7 g/dL — ABNORMAL LOW (ref 3.5–5.0)
Alkaline Phosphatase: 80 U/L (ref 38–126)
Anion gap: 6 (ref 5–15)
BUN: 11 mg/dL (ref 6–20)
CO2: 28 mmol/L (ref 22–32)
Calcium: 8.3 mg/dL — ABNORMAL LOW (ref 8.9–10.3)
Chloride: 103 mmol/L (ref 98–111)
Creatinine, Ser: 0.61 mg/dL (ref 0.44–1.00)
GFR, Estimated: >60 mL/min (ref >60)
Glucose, Bld: 102 mg/dL — ABNORMAL HIGH (ref 70–99)
Potassium: 3.7 mmol/L (ref 3.5–5.1)
Sodium: 137 mmol/L (ref 135–145)
Total Bilirubin: 0.8 mg/dL (ref 0.3–1.2)
Total Protein: 5.8 g/dL — ABNORMAL LOW (ref 6.5–8.1)

## 2020-12-20 IMAGING — DX DG CHEST 1V PORT
1 series · 1 of 1 positions shown · non-contrast
Comparison: [DATE]

CLINICAL DATA: Shoulder pain

EXAM:
PORTABLE CHEST 1 VIEW

[chest ap]
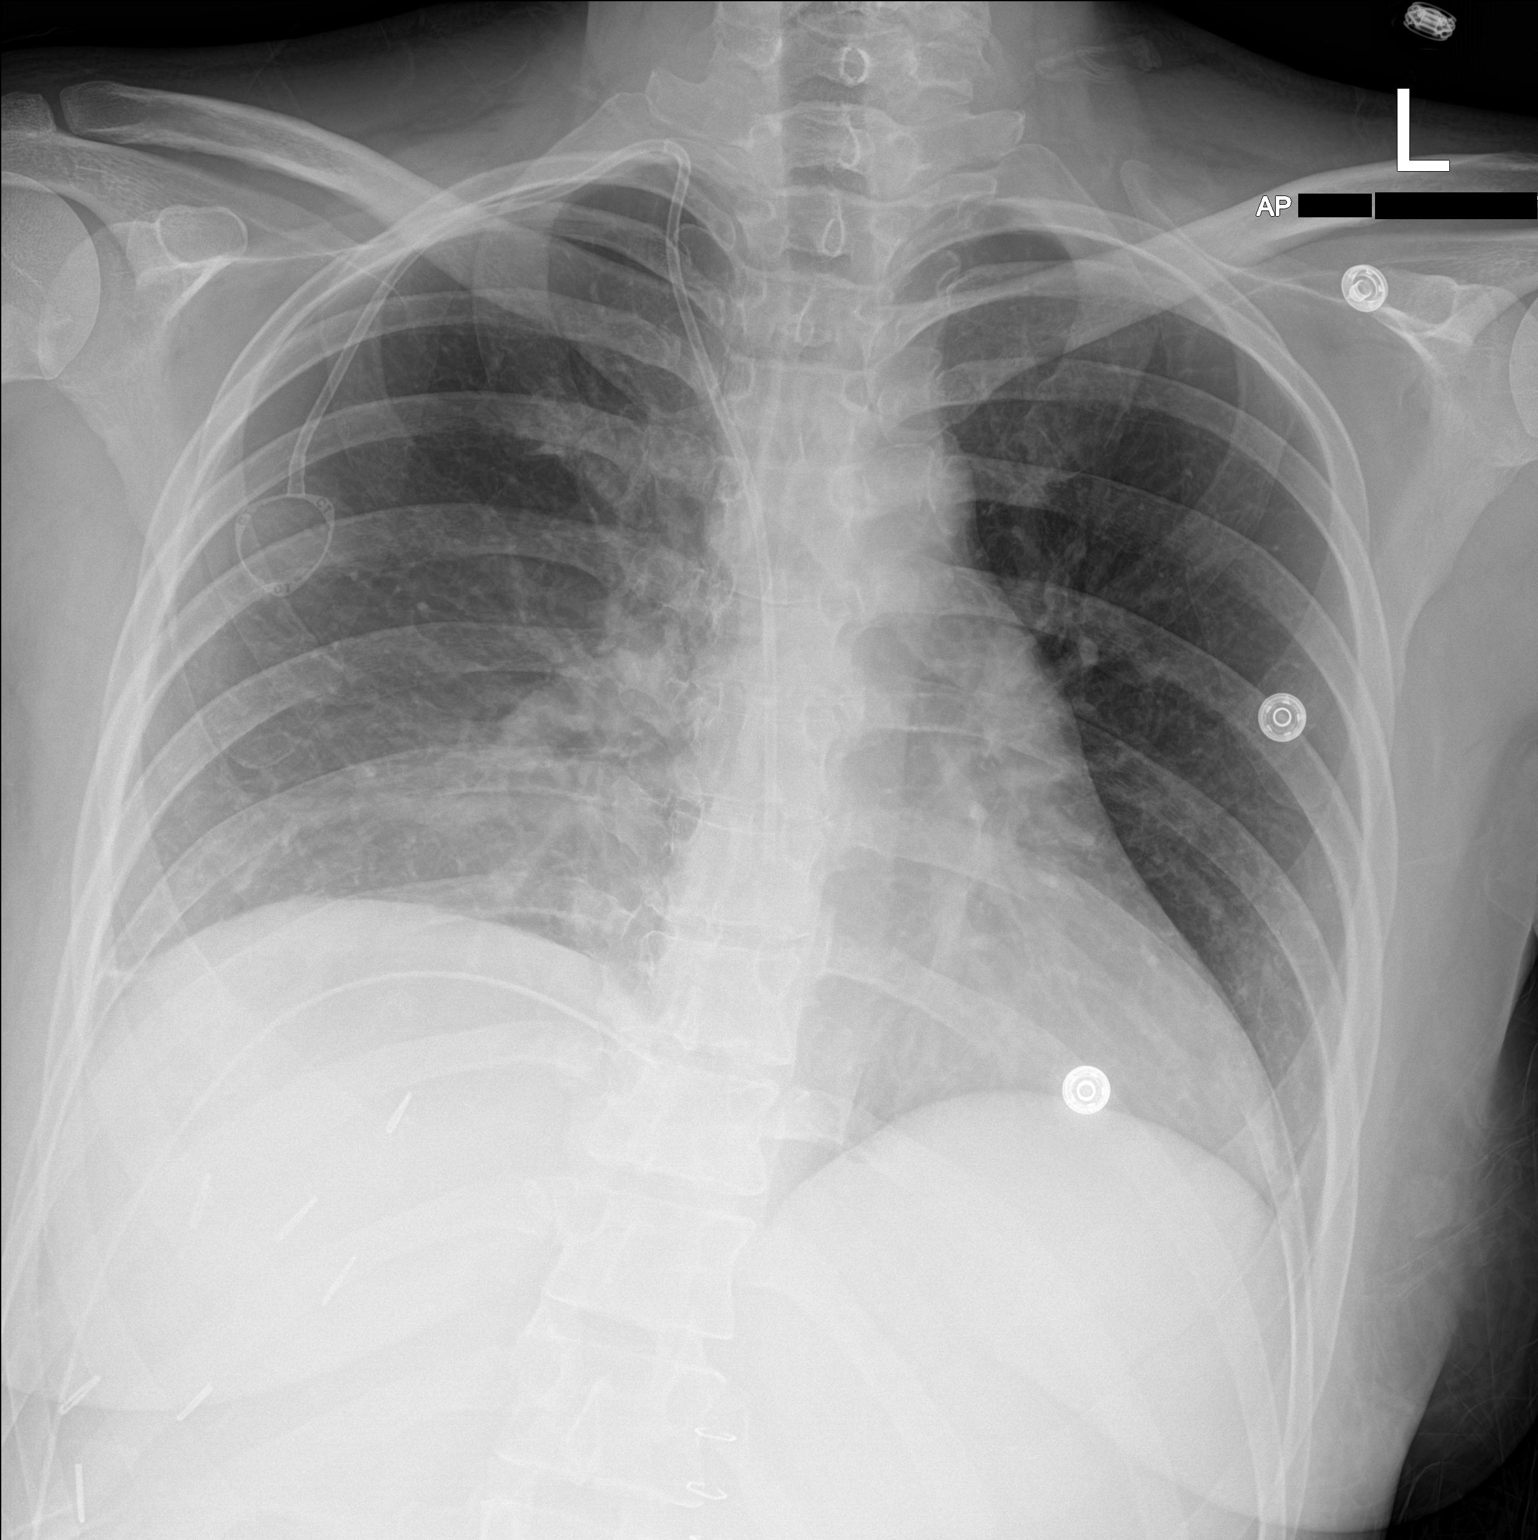

[1 of 1 positions shown; findings below may reference images not displayed]

FINDINGS: The heart size and mediastinal contours are within normal limits. A
right-sided MediPort catheter tip seen at the superior cavoatrial
junction. Both lungs are clear. The visualized skeletal structures
are unremarkable.
IMPRESSION: No active disease.

## 2020-12-20 MED ORDER — HYDROMORPHONE HCL 1 MG/ML IJ SOLN
0.5000 mg | Freq: Once | INTRAMUSCULAR | Status: AC
  Administered 2020-12-20: 1.5 mg via INTRAVENOUS
  Filled 2020-12-20: qty 2

## 2020-12-20 MED ORDER — KETOROLAC TROMETHAMINE 30 MG/ML IJ SOLN
30.0000 mg | Freq: Four times a day (QID) | INTRAMUSCULAR | Status: DC | PRN
  Administered 2020-12-21: 30 mg via INTRAVENOUS
  Filled 2020-12-20: qty 1

## 2020-12-20 MED ORDER — SORBITOL 70 % SOLN
960.0000 mL | TOPICAL_OIL | Freq: Once | ORAL | Status: DC
  Filled 2020-12-20: qty 473

## 2020-12-20 NOTE — Progress Notes (Signed)
Pt has continued to complain of severe right shoulder pain today. Twice, once with OT, and once with this RN, pt crying because of how severe the pain is. PRN Oxycodone and Dilaudid have been utilized constantly throughout the day, as well as ice packs. Dr. Barry Dienes was secure chatted and referred to CT from yesterday. Dr. Barry Dienes secure chatted again when pt experiencing 10/10 sharp pain radiating down arm from shoulder. New orders were placed for chest xray, rt upper extremity US, and additional pain medications.   Justice Rocher, RN

## 2020-12-20 NOTE — Progress Notes (Signed)
Physical Therapy Treatment Patient Details Name: Stephanie [name] MRN: [id number] DOB: Oct 13, 1980 Today's Date: 12/20/2020    History of Present Illness The patient is a 40 year old female who presents for a follow-up for Colorectal cancer. Pt underwent OPEN PARTIAL HEPATECTOMY 3/17, epidural for pain 3/17-3/21. PMH: Colon Cancer diagnosed in 03/12/2020.Pt had surgery 04/04/2020 -  open sigmoid colectomy with ostomy and wound left open.  She also had a liver mass that looked like it might be benign on imaging, but has since been biopsied and is positive for metastatic disease.  The patient got a port and was d/c 04/16/20. She had 6 months of chemotherapy. Restaging scans 2/11 showed continued decrease in the size of the mass.   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    PT Comments    Pt resting in bed upon arrival to room, agreeable to OOB mobility. Pt ambulatory in hallway with use of RW, pt reporting R shoulder pain at ~50 ft ambulation that continued throughout intermittently. Pt's R shoulder pain was exacerbated by sustained overhead activity, full shoulder elevation, and by palpation along distal clavicle, RTC insertion. PT performed gentle STM to shoulder and applied heat packs. Pt continues to demonstrate LE weakness, impaired standing balance, and decreased activity tolerance. PT To continue to follow acutely.    Follow Up Recommendations  Home health PT     Equipment Recommendations  Rolling walker with 5" wheels;3in1 (PT) (may change with progression)    Recommendations for Other Services       Precautions / Restrictions Precautions Precautions: Fall Restrictions Weight Bearing Restrictions: No    Mobility  Bed Mobility Overal bed mobility: Needs Assistance Bed Mobility: Supine to Sit     Supine to sit: Supervision     General bed mobility comments: for safety, increased time and effort with use of bedrails to perform.    Transfers Overall transfer  level: Needs assistance Equipment used: Rolling walker (2 wheeled) Transfers: Sit to/from Stand Sit to Stand: Min guard         General transfer comment: for safety, slow to rise and steady. VErbal cuing for hand placement when rising/sitting. STS x1 from EOB  Ambulation/Gait Ambulation/Gait assistance: Min guard Gait Distance (Feet): 105 Feet Assistive device: Rolling walker (2 wheeled) Gait Pattern/deviations: Step-through pattern;Decreased stride length;Trunk flexed Gait velocity: decr   General Gait Details: min guard for safety, verbal cuing for upright posture. Slow, mildly unsteady gait   Stairs             Wheelchair Mobility    Modified Rankin (Stroke Patients Only)       Balance Overall balance assessment: Needs assistance Sitting-balance support: No upper extremity supported;Feet supported Sitting balance-Leahy Scale: Good     Standing balance support: Bilateral upper extremity supported Standing balance-Leahy Scale: Fair                              Cognition Arousal/Alertness: Awake/alert Behavior During Therapy: Flat affect Overall Cognitive Status: Within Functional Limits for tasks assessed                                        Exercises      General Comments General comments (skin integrity, edema, etc.): TTP along R upper trapezius, distal clavicle, RTC. Movement screen revealed pain and discomfort with overhead  activity, sustained overhead activity, + impingement sign with full shoulder elevation, pain in R shoulder with continued RW use during gait. No pain with subscap motion.      Pertinent Vitals/Pain Pain Assessment: Faces Faces Pain Scale: Hurts even more Pain Location: R shoulder (TTP along distal clavicle, RTC, upper trap) Pain Descriptors / Indicators: Grimacing;Sharp;Discomfort;Sore;Radiating Pain Intervention(s): Limited activity within patient's tolerance;Monitored during  session;Repositioned;Heat applied    Home Living                      Prior Function            PT Goals (current goals can now be found in the care plan section) Acute Rehab PT Goals Patient Stated Goal: to decrease R shoulder pain and improve PT Goal Formulation: With patient Time For Goal Achievement: 12/30/20 Potential to Achieve Goals: Good Progress towards PT goals: Progressing toward goals    Frequency    Min 3X/week      PT Plan Current plan remains appropriate    Co-evaluation              AM-PAC PT "6 Clicks" Mobility   Outcome Measure  Help needed turning from your back to your side while in a flat bed without using bedrails?: A Little Help needed moving from lying on your back to sitting on the side of a flat bed without using bedrails?: A Little Help needed moving to and from a bed to a chair (including a wheelchair)?: A Little Help needed standing up from a chair using your arms (e.g., wheelchair or bedside chair)?: A Little Help needed to walk in hospital room?: A Little Help needed climbing 3-5 steps with a railing? : A Lot 6 Click Score: 17    End of Session   Activity Tolerance: Patient limited by pain;Patient limited by fatigue Patient left: in chair;with call bell/phone within reach; pt states she will not get up without assist from RN staff Nurse Communication: Mobility status;Other (comment) (nausea) PT Visit Diagnosis: Muscle weakness (generalized) (M62.81);Unsteadiness on feet (R26.81);Other abnormalities of gait and mobility (R26.89);Difficulty in walking, not elsewhere classified (R26.2)     Time: 2585-2778 PT Time Calculation (min) (ACUTE ONLY): 19 min  Charges:  $Gait Training: 8-22 mins                     Stacie Glaze, PT Acute Rehabilitation Services Pager [phone]  Office [phone]   [place] 12/20/2020, 4:04 PM

## 2020-12-20 NOTE — Progress Notes (Signed)
Occupational Therapy Treatment Patient Details Name: Stephanie [name] MRN: [id number] DOB: 1980/10/13 Today's Date: 12/20/2020    History of present illness 40 year old female who presents for a follow-up for Colorectal cancer. Pt underwent OPEN PARTIAL HEPATECTOMY 3/17, epidural for pain 3/17-3/21. PMH: Colon Cancer diagnosed in 03/12/2020.Pt had surgery 04/04/2020 -  open sigmoid colectomy with ostomy and wound left open.  She also had a liver mass that looked like it might be benign on imaging, but has since been biopsied and is positive for metastatic disease.  The patient got a port and was d/c 04/16/20. She had 6 months of chemotherapy. Restaging scans 2/11 showed continued decrease in the size of the mass.   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   OT comments  Pt progressing towards established OT goals. Providing manual therapy to R shoulder in preparation for activity to reduce tension and pain. Pt agreeable to OOB and performing grooming at sink. Pt performing oral care and UB bathing at sink with Supervision. Pt reporting increased pain at R shoulder after sustained elevation and activity of RUE. Noting increased tension at R trap. Pt return to supine and applied hot packs to R shoulder; notified RN. Continue to recommend dc to home and will continue to follow acutely as admitted.    Follow Up Recommendations  No OT follow up;Supervision - Intermittent    Equipment Recommendations  Tub/shower seat (pt is Medicaid)    Recommendations for Other Services      Precautions / Restrictions Precautions Precautions: Fall Precaution Comments: Epidural in place - messaged MD regarding possibly removal for mobility as pt having difficulty moving in bed and moving right LE due to numbness and weakness- MD returned message and states it will be addressed 3/21 in am. Restrictions Weight Bearing Restrictions: No       Mobility Bed Mobility Overal bed mobility: Needs  Assistance Bed Mobility: Sit to Supine;Sidelying to Sit;Rolling Rolling: Supervision Sidelying to sit: Supervision Supine to sit: Supervision Sit to supine: Supervision   General bed mobility comments: for safety, increased time and effort with use of bedrails to perform.    Transfers Overall transfer level: Needs assistance Equipment used: None Transfers: Sit to/from Stand Sit to Stand: Min guard         General transfer comment: Min Guard A for safety    Balance Overall balance assessment: Needs assistance Sitting-balance support: No upper extremity supported;Feet supported Sitting balance-Leahy Scale: Good     Standing balance support: No upper extremity supported;During functional activity Standing balance-Leahy Scale: Good                             ADL either performed or assessed with clinical judgement   ADL Overall ADL's : Needs assistance/impaired     Grooming: Supervision/safety;Oral care;Set up;Standing   Upper Body Bathing: Supervision/ safety;Standing                           Functional mobility during ADLs: Supervision/safety General ADL Comments: Pt performing ADLs at s supervision level. Pt performing oral care at sink and quick wash up over axilary area. Limited at R shoulder begand hurting     Vision       Perception     Praxis      Cognition Arousal/Alertness: Awake/alert Behavior During Therapy: Flat affect Overall Cognitive Status: Within Functional Limits for tasks assessed  Exercises Exercises: Other exercises Other Exercises Other Exercises: manual therapy at R trap   Shoulder Instructions       General Comments TTP along R upper trapezius, distal clavicle, RTC. Movement screen revealed pain and discomfort with overhead activity, sustained overhead activity, + impingement sign with full shoulder elevation, pain in R shoulder with continued RW  use during gait. No pain with subscap motion.    Pertinent Vitals/ Pain       Pain Assessment: Faces Faces Pain Scale: Hurts even more Pain Location: R shoulder (TTP along distal clavicle, RTC, upper trap) Pain Descriptors / Indicators: Grimacing;Sharp;Discomfort;Sore;Radiating Pain Intervention(s): Monitored during session;Limited activity within patient's tolerance;Repositioned  Home Living                                          Prior Functioning/Environment              Frequency  Min 2X/week        Progress Toward Goals  OT Goals(current goals can now be found in the care plan section)  Progress towards OT goals: Progressing toward goals  Acute Rehab OT Goals Patient Stated Goal: to decrease R shoulder pain and improve OT Goal Formulation: With patient Time For Goal Achievement: 12/31/20 Potential to Achieve Goals: Good ADL Goals Pt Will Perform Grooming: Independently;standing Pt Will Perform Upper Body Bathing: Independently;sitting Pt Will Perform Lower Body Bathing: Independently;sit to/from stand Pt Will Perform Upper Body Dressing: Independently;sitting Pt Will Perform Lower Body Dressing: Independently;sit to/from stand Pt Will Transfer to Toilet: with modified independence;ambulating;grab bars Pt Will Perform Toileting - Clothing Manipulation and hygiene: Independently;sit to/from stand Pt Will Perform Tub/Shower Transfer: Tub transfer;with modified independence;ambulating;shower seat;rolling walker Additional ADL Goal #1: Pt will be Independent in and OOB for basic ADLs  Plan Discharge plan remains appropriate    Co-evaluation                 AM-PAC OT "6 Clicks" Daily Activity     Outcome Measure   Help from another person eating meals?: None Help from another person taking care of personal grooming?: A Little Help from another person toileting, which includes using toliet, bedpan, or urinal?: A Little Help from another  person bathing (including washing, rinsing, drying)?: A Little Help from another person to put on and taking off regular upper body clothing?: A Little Help from another person to put on and taking off regular lower body clothing?: A Little 6 Click Score: 19    End of Session    OT Visit Diagnosis: Unsteadiness on feet (R26.81);Other abnormalities of gait and mobility (R26.89);Pain;Muscle weakness (generalized) (M62.81) Pain - Right/Left: Right Pain - part of body: Shoulder   Activity Tolerance Patient tolerated treatment well   Patient Left with call bell/phone within reach;in bed;with bed alarm set   Nurse Communication Mobility status        Time: 9702-6378 OT Time Calculation (min): 26 min  Charges: OT General Charges $OT Visit: 1 Visit OT Treatments $Self Care/Home Management : 8-22 mins $Therapeutic Activity: 8-22 mins  Malini Flemings MSOT, OTR/L Acute Rehab Pager: [phone] Office: [place] 12/20/2020, 5:13 PM

## 2020-12-20 NOTE — Progress Notes (Signed)
SMOG enema has been ordered for pt and sent up by pharmacy. When RN went in to prepare pt, she had begun to have output from colostomy, and stated that she would prefer to hold off on the enema for now. Pt requests to continue with Miralax and milk of mag.   Justice Rocher, RN

## 2020-12-20 NOTE — Progress Notes (Signed)
General Surgery Follow Up Note  Subjective:    Overnight Issues:  CT yesterday did show small pancake type fluid collection, but drain is in it and draining.  Still poor PO intake and no real stool via stoma.  Lots of stool in colon on scan.  No stool since PO contrast.     Objective:  Vital signs for last 24 hours: Temp:  [97.4 F (36.3 C)-98.7 F (37.1 C)] 98 F (36.7 C) (03/24 0747) Pulse Rate:  [64-80] 64 (03/24 0747) Resp:  [13-20] 13 (03/24 0747) BP: (103-194)/(59-83) 104/63 (03/24 0747) SpO2:  [96 %-100 %] 100 % (03/24 0747)  Intake/Output from previous day: 03/23 0701 - 03/24 0700 In: 540 [P.O.:540] Out: 240 [Drains:240]  Intake/Output this shift: No intake/output data recorded.  Physical Exam:  Gen: alert and oriented.   Neck: supple Pulm: unlabored breathing Abd: soft, non distended, approp tender, drain less bile stained.  Incision c/d/i.  No erythema.   Extr: wwp, no edema   Results for orders placed or performed during the hospital encounter of 12/13/20 (from the past 24 hour(s))  CBC     Status: Abnormal   Collection Time: 12/20/20  2:01 AM  Result Value Ref Range   WBC 5.8 4.0 - 10.5 K/uL   RBC 3.38 (L) 3.87 - 5.11 MIL/uL   Hemoglobin 10.8 (L) 12.0 - 15.0 g/dL   HCT 32.7 (L) 36.0 - 46.0 %   MCV 96.7 80.0 - 100.0 fL   MCH 32.0 26.0 - 34.0 pg   MCHC 33.0 30.0 - 36.0 g/dL   RDW 13.2 11.5 - 15.5 %   Platelets 112 (L) 150 - 400 K/uL   nRBC 0.0 0.0 - 0.2 %  Comprehensive metabolic panel     Status: Abnormal   Collection Time: 12/20/20  2:01 AM  Result Value Ref Range   Sodium 137 135 - 145 mmol/L   Potassium 3.7 3.5 - 5.1 mmol/L   Chloride 103 98 - 111 mmol/L   CO2 28 22 - 32 mmol/L   Glucose, Bld 102 (H) 70 - 99 mg/dL   BUN 11 6 - 20 mg/dL   Creatinine, Ser 0.61 0.44 - 1.00 mg/dL   Calcium 8.3 (L) 8.9 - 10.3 mg/dL   Total Protein 5.8 (L) 6.5 - 8.1 g/dL   Albumin 2.7 (L) 3.5 - 5.0 g/dL   AST 37 15 - 41 U/L   ALT 75 (H) 0 - 44 U/L   Alkaline  Phosphatase 80 38 - 126 U/L   Total Bilirubin 0.8 0.3 - 1.2 mg/dL   GFR, Estimated >60 >60 mL/min   Anion gap 6 5 - 15    Assessment & Plan: The plan of care was discussed with the bedside nurse for the day, who is in agreement with this plan and no additional concerns were raised.   Present on Admission: . Colon cancer ([place]) . Metastatic colon cancer to liver ([place])    LOS: 7 days   Additional comments:I reviewed the patient's new clinical lab test results.   and I reviewed the patients new imaging test results.    s/pProcedure(s) with comments: OPEN PARTIAL HEPATECTOMY (N/A) -  LAPAROSCOPY DIAGNOSTIC (N/A) INTRAOPERATIVE LIVER ULTRASOUND (N/A) For metastatic colon cancer- Margins negative, 80% tumor necrotic.     Regular diet as tolerated.   Oxycodone, prn robaxin and tramadol.  IV dilaudid for breakthrough.   Fluid collection has drain in it, so do not need to pursue perc drain.   Continue IV  fluids given that patient is only eating around 1/3 of her meals.   Enema via stoma.    JP to remain for now, watch amount and character of output.   Encourage IS/ambulation Hopefully home in next few days if bowel function comes back.     Milus Height, MD FACS Surgical Oncology, General Surgery, Trauma and [place] [facility], [place] for weekday/non holidays Check [email or web address] for coverage night/weekend/holidays  Do not use SecureChat as it is not reliable for timely patient care.     12/20/2020  *Care during the described time interval was provided by me. I have reviewed this patient's available data, including medical history, events of note, physical examination and test results as part of my evaluation.

## 2020-12-21 ENCOUNTER — Inpatient Hospital Stay (HOSPITAL_COMMUNITY): Payer: Medicaid Other

## 2020-12-21 DIAGNOSIS — M79601 Pain in right arm: Secondary | ICD-10-CM

## 2020-12-21 MED ORDER — OXYCODONE HCL 5 MG PO TABS: 5.0000 mg | ORAL_TABLET | ORAL | 0 refills | Status: DC | PRN

## 2020-12-21 MED ORDER — METHOCARBAMOL 500 MG PO TABS: 500.0000 mg | ORAL_TABLET | Freq: Four times a day (QID) | ORAL | 1 refills | Status: DC | PRN

## 2020-12-21 MED ORDER — ACETAMINOPHEN 325 MG PO TABS: 650.0000 mg | ORAL_TABLET | Freq: Four times a day (QID) | ORAL | Status: DC | PRN

## 2020-12-21 NOTE — Discharge Instructions (Signed)
[facility], [place] [phone]  ABDOMINAL SURGERY: POST OP INSTRUCTIONS  Always review your discharge instruction sheet given to you by the facility where your surgery was performed.  IF YOU HAVE DISABILITY OR FAMILY LEAVE FORMS, YOU MUST BRING THEM TO THE OFFICE FOR PROCESSING.  PLEASE DO NOT GIVE THEM TO YOUR DOCTOR.  1. A prescription for pain medication may be given to you upon discharge.  Take your pain medication as prescribed, if needed.  If narcotic pain medicine is not needed, then you may take acetaminophen (Tylenol) or ibuprofen (Advil) as needed. 2. Take your usually prescribed medications unless otherwise directed. 3. If you need a refill on your pain medication, please contact your pharmacy. They will contact our office to request authorization.  Prescriptions will not be filled after 5pm or on week-ends. 4. You should follow a light diet the first few days after arrival home, such as soup and crackers, pudding, etc.unless your doctor has advised otherwise. A high-fiber, low fat diet can be resumed as tolerated.   Be sure to include lots of fluids daily. Most patients will experience some swelling and bruising on the chest and neck area.  Ice packs will help.  Swelling and bruising can take several days to resolve 5. Most patients will experience some swelling and bruising in the area of the incision. Ice pack will help. Swelling and bruising can take several days to resolve..  6. It is common to experience some constipation if taking pain medication after surgery.  Increasing fluid intake and taking a stool softener will usually help or prevent this problem from occurring.  A mild laxative (Milk of Magnesia or Miralax) should be taken according to package directions if there are no bowel movements after 48 hours. 7.  You may have steri-strips (small skin tapes) in place directly over the incision.  These strips should be left on the skin for 10-14 days.  If your  surgeon used skin glue on the incision, you may shower in 48 hours.  The glue will flake off over the next 2-3 weeks.  Any sutures or staples will be removed at the office during your follow-up visit. You may find that a light gauze bandage over your incision may keep your staples from being rubbed or pulled. You may shower and replace the bandage daily. 8. ACTIVITIES:  You may resume regular (light) daily activities beginning the next day--such as daily self-care, walking, climbing stairs--gradually increasing activities as tolerated.  You may have sexual intercourse when it is comfortable.  Refrain from any heavy lifting or straining until approved by your doctor. a. You may drive when you no longer are taking prescription pain medication, you can comfortably wear a seatbelt, and you can safely maneuver your car and apply brakes b. Return to Work: __________8 weeks if applicable_________________________ 44. You should see your doctor in the office for a follow-up appointment approximately two weeks after your surgery.  Make sure that you call for this appointment within a day or two after you arrive home to insure a convenient appointment time. OTHER INSTRUCTIONS:  _____________________________________________________________ _____________________________________________________________  WHEN TO CALL YOUR DOCTOR: 1. Fever over 101.0 2. Inability to urinate 3. Nausea and/or vomiting 4. Extreme swelling or bruising 5. Continued bleeding from incision. 6. Increased pain, redness, or drainage from the incision. 7. Difficulty swallowing or breathing 8. Muscle cramping or spasms. 9. Numbness or tingling in hands or feet or around lips.  The clinic staff is  available to answer your questions during regular business hours.  Please don't hesitate to call and ask to speak to one of the nurses if you have concerns.  For further questions, please visit [email or web address]     Surgical  [facility] Care Surgical drains are used to remove extra fluid that normally builds up in a surgical wound after surgery. A surgical drain helps to heal a surgical wound. Different kinds of surgical drains include:  Active drains. These drains use suction to pull drainage away from the surgical wound. Drainage flows through a tube to a container outside of the body. With these drains, you need to keep the bulb or the drainage container flat (compressed) at all times, except while you empty it. Flattening the bulb or container creates suction.  Passive drains. These drains allow fluid to drain naturally, by gravity. Drainage flows through a tube to a bandage (dressing) or a container outside of the body. Passive drains do not need to be emptied. A drain is placed during surgery. Right after surgery, drainage is usually bright red and a little thicker than water. The drainage may gradually turn yellow or pink and become thinner. It is likely that your health care provider will remove the drain when the drainage stops or when the amount decreases to 1-2 Tbsp (15-30 mL) during a 24-hour period. Supplies needed:  Tape.  Germ-free cleaning solution (sterile saline).  Cotton swabs.  Split gauze drain sponge: 4 x 4 inches (10 x 10 cm).  Gauze square: 4 x 4 inches (10 x 10 cm). How to care for your surgical drain Care for your drain as told by your health care provider. This is important to help prevent infection. If your drain is placed at your back, or any other hard-to-reach area, ask another person to assist you in performing the following tasks: General care  Keep the skin around the drain dry and covered with a dressing at all times.  Check your drain area every day for signs of infection. Check for: ? Redness, swelling, or pain. ? Pus or a bad smell. ? Cloudy drainage. ? Tenderness or pressure at the drain exit site. Changing the dressing Follow instructions from your health care provider  about how to change your dressing. Change your dressing at least once a day. Change it more often if needed to keep the dressing dry. Make sure you: 1. Gather your supplies. 2. Wash your hands with soap and water before you change your dressing. If soap and water are not available, use hand sanitizer. 3. Remove the old dressing. Avoid using scissors to do that. 4. Wash your hands with soap and water again after removing the old dressing. 5. Use sterile saline to clean your skin around the drain. You may need to use a cotton swab to clean the skin. 6. Place the tube through the slit in a drain sponge. Place the drain sponge so that it covers your wound. 7. Place the gauze square or another drain sponge on top of the drain sponge that is on the wound. Make sure the tube is between those layers. 8. Tape the dressing to your skin. 9. Tape the drainage tube to your skin 1-2 inches (2.5-5 cm) below the place where the tube enters your body. Taping keeps the tube from pulling on any stitches (sutures) that you have. 10. Wash your hands with soap and water. 11. Write down the color of your drainage and how often you change your dressing. How to  empty your active drain 1. Make sure that you have a measuring cup that you can empty your drainage into. 2. Wash your hands with soap and water. If soap and water are not available, use hand sanitizer. 3. Loosen any pins or clips that hold the tube in place. 4. If your health care provider tells you to strip the tube to prevent clots and tube blockages: ? Hold the tube at the skin with one hand. Use your other hand to pinch the tubing with your thumb and first finger. ? Gently move your fingers down the tube while squeezing very lightly. This clears any drainage, clots, or tissue from the tube. ? You may need to do this several times each day to keep the tube clear. Do not pull on the tube. 5. Open the bulb cap or the drain plug. Do not touch the inside of the  cap or the bottom of the plug. 6. Turn the device upside down and gently squeeze. 7. Empty all of the drainage into the measuring cup. 8. Compress the bulb or the container and replace the cap or the plug. To compress the bulb or the container, squeeze it firmly in the middle while you close the cap or plug the container. 9. Write down the amount of drainage that you have in each 24-hour period. If you have less than 2 Tbsp (30 mL) of drainage during 24 hours, contact your health care provider. 10. Flush the drainage down the toilet. 11. Wash your hands with soap and water.   Contact a health care provider if:  You have redness, swelling, or pain around your drain area.  You have pus or a bad smell coming from your drain area.  You have a fever or chills.  The skin around your drain is warm to the touch.  The amount of drainage that you have is increasing instead of decreasing.  You have drainage that is cloudy.  There is a sudden stop or a sudden decrease in the amount of drainage that you have.  Your drain tube falls out.  Your active drain does not stay compressed after you empty it. Summary  Surgical drains are used to remove extra fluid that normally builds up in a surgical wound after surgery.  Different kinds of surgical drains include active drains and passive drains. Active drains use suction to pull drainage away from the surgical wound, and passive drains allow fluid to drain naturally.  It is important to care for your drain to prevent infection. If your drain is placed at your back, or any other hard-to-reach area, ask another person to assist you.  Contact your health care provider if you have redness, swelling, or pain around your drain area. This information is not intended to replace advice given to you by your health care provider. Make sure you discuss any questions you have with your health care provider. Document Revised: 10/20/2018 Document Reviewed:  10/20/2018 Elsevier Patient Education  2021 [employer].

## 2020-12-21 NOTE — Progress Notes (Signed)
   General Surgery Follow Up Note  Subjective:    Overnight Issues:  Pt developed severe right shoulder pain yesterday radiating down her arm.  toradol and IV dilaudid required for relief.  CXR did not show any significant pleural effusion.   She has started having good bowel function.  Objective:  Vital signs for last 24 hours: Temp:  [98 F (36.7 C)-98.4 F (36.9 C)] 98.3 F (36.8 C) (03/25 1117) Pulse Rate:  [54-80] 78 (03/25 1117) Resp:  [12-16] 12 (03/25 1117) BP: (105-119)/(56-74) 109/64 (03/25 1117) SpO2:  [98 %-100 %] 99 % (03/25 1117)  Intake/Output from previous day: 03/24 0701 - 03/25 0700 In: 300 [P.O.:300] Out: 100 [Drains:100]  Intake/Output this shift: Total I/O In: -  Out: 90 [Drains:90]  Physical Exam:  Gen: alert and oriented.   Neck: supple Pulm: unlabored breathing Abd: soft, non distended, approp tender, drain less bile stained.  Incision c/d/i.  No erythema.  Stoma with stool and gas.   Extr: wwp, no edema   No results found for this or any previous visit (from the past 24 hour(s)).  Assessment & Plan: The plan of care was discussed with the bedside nurse for the day, who is in agreement with this plan and no additional concerns were raised.   Present on Admission: . Colon cancer ([place]) . Metastatic colon cancer to liver ([place])    LOS: 8 days   Additional comments:I reviewed the patient's new clinical lab test results.   and I reviewed the patients new imaging test results.    s/pProcedure(s) with comments: OPEN PARTIAL HEPATECTOMY (N/A) -  LAPAROSCOPY DIAGNOSTIC (N/A) INTRAOPERATIVE LIVER ULTRASOUND (N/A) For metastatic colon cancer- Margins negative, 80% tumor necrotic.   anemia of chronic dx, anemia secondary to chemotherapy and ABL anemia. Moderate protein calorie malnutrition.  Regular diet as tolerated.  Diet is slowly improving.  Will saline lock again. Oxycodone, prn robaxin and tramadol.  IV dilaudid for breakthrough.   Fluid  collection has drain in it, so do not need to pursue perc drain.  This could be causing diaphragmatic irritation and be possible source of shoulder pain.        JP to remain for now, watch amount and character of output.   Encourage IS/ambulation Home tomorrow.    Milus Height, MD FACS Surgical Oncology, General Surgery, Trauma and [facility], [place] for weekday/non holidays Check [email or web address] for coverage night/weekend/holidays  Do not use SecureChat as it is not reliable for timely patient care.     12/21/2020  *Care during the described time interval was provided by me. I have reviewed this patient's available data, including medical history, events of note, physical examination and test results as part of my evaluation.

## 2020-12-21 NOTE — Progress Notes (Signed)
Upper extremity venous has been completed.   Preliminary results in CV Proc.   Abram Sander 12/21/2020 11:37 AM

## 2020-12-21 NOTE — Progress Notes (Signed)
Physical Therapy Treatment Patient Details Name: Stephanie [name] MRN: [id number] DOB: November 19, 1980 Today's Date: 12/21/2020    History of Present Illness 40 year old female who presents for a follow-up for Colorectal cancer. Pt underwent OPEN PARTIAL HEPATECTOMY 3/17, epidural for pain 3/17-3/21. PMH: Colon Cancer diagnosed in 03/12/2020.Pt had surgery 04/04/2020 -  open sigmoid colectomy with ostomy and wound left open.  She also had a liver mass that looked like it might be benign on imaging, but has since been biopsied and is positive for metastatic disease.  The patient got a port and was d/c 04/16/20. She had 6 months of chemotherapy. Restaging scans 2/11 showed continued decrease in the size of the mass.   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    PT Comments    Pt resting in bed, requesting pain medications for pain "everywhere". Pt tolerated increased gait distance in hallway with use of RW and close guard for safety, PT attempted to encourage pt to ambulate with HHA or IV pole but pt declines due to being insecure about balance. PT instructed pt in shoulder rolls forward/back as well as cat/cow pose in seated position to increase trunk and shoulder girdle mobility, tolerated very little due to R shoulder pain. PT to continue to follow acutely.    Follow Up Recommendations  Outpatient PT     Equipment Recommendations  Rolling walker with 5" wheels;3in1 (PT) (may change with progression)    Recommendations for Other Services       Precautions / Restrictions Precautions Precautions: Fall Restrictions Weight Bearing Restrictions: No    Mobility  Bed Mobility Overal bed mobility: Modified Independent             General bed mobility comments: mod I for increased time, use of bedrails; good log roll in and out of bed    Transfers Overall transfer level: Needs assistance Equipment used: Rolling walker (2 wheeled) Transfers: Sit to/from Stand Sit to Stand:  Supervision         General transfer comment: for safety  Ambulation/Gait Ambulation/Gait assistance: Min guard Gait Distance (Feet): 200 Feet Assistive device: Rolling walker (2 wheeled) Gait Pattern/deviations: Step-through pattern;Decreased stride length;Trunk flexed Gait velocity: decr   General Gait Details: min guard for safety, verbal cuing for upright posture. PT encouraged pt to attempt ambulation without AD, pt declines due to being nervous about balance.   Stairs             Wheelchair Mobility    Modified Rankin (Stroke Patients Only)       Balance Overall balance assessment: Needs assistance Sitting-balance support: No upper extremity supported;Feet supported Sitting balance-Leahy Scale: Good     Standing balance support: No upper extremity supported;During functional activity Standing balance-Leahy Scale: Fair                              Cognition Arousal/Alertness: Awake/alert Behavior During Therapy: WFL for tasks assessed/performed Overall Cognitive Status: Within Functional Limits for tasks assessed                                        Exercises Other Exercises Other Exercises: seated cat/cow exercise for postural correction, increased pain free ROM in shoulder girdle    General Comments        Pertinent Vitals/Pain Pain Assessment: Faces Faces Pain  Scale: Hurts even more Pain Location: R shoulder Pain Descriptors / Indicators: Grimacing;Sharp;Discomfort;Sore;Radiating Pain Intervention(s): Limited activity within patient's tolerance;Monitored during session;Repositioned;Patient requesting pain meds-RN notified    Home Living                      Prior Function            PT Goals (current goals can now be found in the care plan section) Acute Rehab PT Goals Patient Stated Goal: to decrease R shoulder pain and improve PT Goal Formulation: With patient Time For Goal Achievement:  12/30/20 Potential to Achieve Goals: Good Progress towards PT goals: Progressing toward goals    Frequency    Min 3X/week      PT Plan Discharge plan needs to be updated    Co-evaluation              AM-PAC PT "6 Clicks" Mobility   Outcome Measure  Help needed turning from your back to your side while in a flat bed without using bedrails?: A Little Help needed moving from lying on your back to sitting on the side of a flat bed without using bedrails?: A Little Help needed moving to and from a bed to a chair (including a wheelchair)?: A Little Help needed standing up from a chair using your arms (e.g., wheelchair or bedside chair)?: A Little Help needed to walk in hospital room?: A Little Help needed climbing 3-5 steps with a railing? : A Little 6 Click Score: 18    End of Session   Activity Tolerance: Patient limited by pain;Patient limited by fatigue Patient left: in bed;with call bell/phone within reach Nurse Communication: Mobility status PT Visit Diagnosis: Muscle weakness (generalized) (M62.81);Unsteadiness on feet (R26.81);Other abnormalities of gait and mobility (R26.89);Difficulty in walking, not elsewhere classified (R26.2)     Time: 1601-0932 PT Time Calculation (min) (ACUTE ONLY): 20 min  Charges:  $Gait Training: 8-22 mins                    Stacie Glaze, PT Acute Rehabilitation Services Pager [phone]  Office [phone]    Roxine Caddy E Ruffin Pyo 12/21/2020, 5:11 PM

## 2020-12-22 NOTE — Progress Notes (Signed)
Patient instructed on AVS including follow up appointments, Discharge instruction including wound and drain care, Medication regimen, where to pick up Rx, and when to call MD.  All belongings packed at this time including new equipment and being taken down with patient to personal car to meet mother.  No complaints or distress noted at this time.

## 2020-12-22 NOTE — TOC Transition Note (Signed)
Transition of Care [facility]) - CM/SW Discharge Note   Patient Details  Name: Stephanie [name] MRN: [id number] Date of Birth: January 15, 1981  [facility] of Care [facility]) CM/SW Contact:  Bartholomew Crews, RN Phone Number: [phone] 12/22/2020, 11:36 AM   Clinical Narrative:     Notified by nursing of patient needing DME - RW, 3-N-1 and outpatient therapy. DME arranged with [employer] for delivery to the room. Outpatient rehab referral placed for [facility] location. No further TOC needs identified.   Final next level of care: OP Rehab Barriers to Discharge: No Barriers Identified   Patient Goals and CMS Choice Patient states their goals for this hospitalization and ongoing recovery are:: return home CMS Medicare.gov Compare Post Acute Care list provided to:: Patient Choice offered to / list presented to : Patient  Discharge Placement                       Discharge Plan and Services                DME Arranged: 3-N-1,[place] DME Agency: [employer] Date DME Agency Contacted: 12/22/20 Time DME Agency Contacted: 33 Representative spoke with at DME Agency: Jodell Cipro HH Arranged: NA [place] Agency: NA        Social Determinants of Health ([place]) Interventions     Readmission Risk Interventions No flowsheet data found.

## 2020-12-22 NOTE — Plan of Care (Signed)
  Problem: Clinical Measurements: Goal: Ability to maintain clinical measurements within normal limits will improve Outcome: Adequate for Discharge Goal: Postoperative complications will be avoided or minimized Outcome: Adequate for Discharge   Problem: Skin Integrity: Goal: Demonstration of wound healing without infection will improve Outcome: Adequate for Discharge   Problem: Education: Goal: Knowledge of General Education information will improve Description: Including pain rating scale, medication(s)/side effects and non-pharmacologic comfort measures Outcome: Adequate for Discharge   Problem: Health Behavior/Discharge Planning: Goal: Ability to manage health-related needs will improve Outcome: Adequate for Discharge   Problem: Clinical Measurements: Goal: Ability to maintain clinical measurements within normal limits will improve Outcome: Adequate for Discharge Goal: Will remain free from infection Outcome: Adequate for Discharge Goal: Respiratory complications will improve Outcome: Adequate for Discharge Goal: Cardiovascular complication will be avoided Outcome: Adequate for Discharge   Problem: Nutrition: Goal: Adequate nutrition will be maintained Outcome: Adequate for Discharge   Problem: Coping: Goal: Level of anxiety will decrease Outcome: Adequate for Discharge   Problem: Elimination: Goal: Will not experience complications related to bowel motility Outcome: Adequate for Discharge Goal: Will not experience complications related to urinary retention Outcome: Adequate for Discharge   Problem: Pain Managment: Goal: General experience of comfort will improve Outcome: Adequate for Discharge   Problem: Safety: Goal: Ability to remain free from injury will improve Outcome: Adequate for Discharge   Problem: Skin Integrity: Goal: Risk for impaired skin integrity will decrease Outcome: Adequate for Discharge

## 2020-12-24 ENCOUNTER — Other Ambulatory Visit: Payer: Self-pay | Admitting: Hematology

## 2020-12-25 MED ORDER — TRAMADOL HCL 50 MG PO TABS: 50.0000 mg | ORAL_TABLET | Freq: Two times a day (BID) | ORAL | 0 refills | Status: DC | PRN

## 2020-12-25 NOTE — Discharge Summary (Signed)
Physician Discharge Summary  Patient ID: Stephanie [name] MRN: [id number] DOB/AGE: August 30, 1981 40 y.o.  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  She had a lot of pain overnight despite epidural and PCA.  These were adjusted.  Her UOP was a bit marginal and BPs were 35T systolic, so foley was left for several days to monitor. Pt was consulted. Foley was removed on POD 1.  She required transfusion for symptomatic anemia.   Her legs were numb from epidural.  This was turned down once she was on diet and tolerating oral narcotics.  Epidural was removed.  Leg numbness also improved.  She developed some right mid back/flank pain.  Her WBCs were normal and no fever, but pain was progressive and moved to right shoulder.  CT abd/pelvis was done and showed a fluid collection in the space where the liver was removed.  The drain was functional and was looped in the fluid collection, so no additional procedures were requested.  She also had a upper extremity duplex to evaluate for VTE in side with port and shoulder/neck pain.  This was also negative.  She became progressively more ambulatory.  She continued her ostomy care.  Her appetite improved.  She was discharged to home in improved condition.    Consults: None  Significant Diagnostic Studies: labs: HCT 32.7, Cr 0.61, K 2.7, WBCs 5.8 prior to d/c. CT described above  Treatments: surgery: see above  Discharge Exam: Blood pressure 115/75, pulse (!) 58, temperature 98.1 F  (36.7 C), temperature source Oral, resp. rate 18, height 5\' 9"  (1.753 m), weight 68 kg, last menstrual period 12/20/2020, SpO2 98 %. General appearance: alert, cooperative and no distress Resp: breathing comfortably GI: soft, non distended, non tender, drain old blood Extremities: extremities normal, atraumatic, no cyanosis or edema  Disposition: Discharge disposition: 01-Home or Self Care       Discharge Instructions    Ambulatory referral to Physical Therapy   Complete by: As directed    Call MD for:  difficulty breathing, headache or visual disturbances   Complete by: As directed    Call MD for:  hives   Complete by: As directed    Call MD for:  persistant nausea and vomiting   Complete by: As directed    Call MD for:  redness, tenderness, or signs of infection (pain, swelling, redness, odor or green/yellow discharge around incision site)   Complete by: As directed    Call MD for:  severe uncontrolled pain   Complete by: As directed    Call MD for:  temperature >100.4   Complete by: As directed    Change dressing (specify)   Complete by: As directed    Dry dressing around drain as needed.  Measure and record drain output at least once daily and up to 2-3 times if needed.   Diet - low sodium heart healthy   Complete by: As directed    Diet - low sodium heart healthy   Complete by: As directed    Increase activity slowly   Complete by: As directed  Increase activity slowly   Complete by: As directed      Allergies as of 12/22/2020      Reactions   Oxaliplatin Shortness Of Breath      Medication List    TAKE these medications   acetaminophen 325 MG tablet Commonly known as: TYLENOL Take 650 mg by mouth daily as needed for fever or headache (pain). What changed: Another medication with the same name was added. Make sure you understand how and when to take each.   acetaminophen 325 MG tablet Commonly known as: TYLENOL Take 2 tablets (650 mg total) by mouth  every 6 (six) hours as needed for mild pain (or Fever >/= 101). What changed: You were already taking a medication with the same name, and this prescription was added. Make sure you understand how and when to take each.   diclofenac Sodium 1 % Gel Commonly known as: VOLTAREN Apply 2 g topically 4 (four) times daily. What changed:   when to take this  reasons to take this   dicyclomine 10 MG capsule Commonly known as: Bentyl Take 1 capsule (10 mg total) by mouth 3 (three) times daily before meals. What changed:   when to take this  reasons to take this   lidocaine-prilocaine cream Commonly known as: EMLA Apply to affected area once What changed:   how much to take  how to take this  when to take this  reasons to take this  additional instructions   LORazepam 0.5 MG tablet Commonly known as: ATIVAN Take 1 tablet (0.5 mg total) by mouth 2 (two) times daily as needed for anxiety.   methocarbamol 500 MG tablet Commonly known as: ROBAXIN Take 1 tablet (500 mg total) by mouth every 6 (six) hours as needed for muscle spasms.   metroNIDAZOLE 500 MG tablet Commonly known as: FLAGYL Take 1 tablet (500 mg total) by mouth 2 (two) times daily.   ondansetron 8 MG tablet Commonly known as: Zofran Take 1 tablet (8 mg total) by mouth 2 (two) times daily as needed. Start on day 3 after chemotherapy. What changed: reasons to take this   oxyCODONE 5 MG immediate release tablet Commonly known as: Oxy IR/ROXICODONE Take 1 tablet (5 mg total) by mouth every 4 (four) hours as needed for moderate pain, severe pain or breakthrough pain.   pantoprazole 40 MG tablet Commonly known as: PROTONIX Take 1 tablet (40 mg total) by mouth 2 (two) times daily. What changed:   when to take this  reasons to take this   PARAGARD INTRAUTERINE COPPER IU 1 Device by Intrauterine route once.   polyethylene glycol 17 g packet Commonly known as: MIRALAX / GLYCOLAX Take 17 g by mouth 2 (two)  times daily. What changed:   when to take this  reasons to take this   potassium chloride SA 20 MEQ tablet Commonly known as: KLOR-CON Take 1 tablet (20 mEq total) by mouth daily.   prochlorperazine 10 MG tablet Commonly known as: COMPAZINE Take 1 tablet (10 mg total) by mouth every 6 (six) hours as needed (Nausea or vomiting).   senna-docusate 8.6-50 MG tablet Commonly known as: Senokot-S Take 2 tablets by mouth 2 (two) times daily.   valACYclovir 500 MG tablet Commonly known as: VALTREX Take 1 tablet (500 mg total) by mouth 2 (two) times daily.            Discharge Care Instructions  (From admission, onward)         Start  Ordered   12/21/20 0000  Change dressing (specify)       Comments: Dry dressing around drain as needed.  Measure and record drain output at least once daily and up to 2-3 times if needed.   12/21/20 1501          Follow-up Information    [name], MD Follow up in 2 week(s).   Specialty: General Surgery Contact information: [address] [place] [address] [phone]        [facility], [place] Follow up in 1 week(s).   Specialty: General Surgery Why: for staple removal Contact information: [place] [address] [phone]               Signed: [name] 12/25/2020, 11:50 AM

## 2020-12-30 ENCOUNTER — Other Ambulatory Visit: Payer: Self-pay

## 2020-12-30 ENCOUNTER — Encounter (HOSPITAL_COMMUNITY): Payer: Self-pay | Admitting: Emergency Medicine

## 2020-12-30 ENCOUNTER — Inpatient Hospital Stay (HOSPITAL_COMMUNITY)
Admission: EM | Admit: 2020-12-30 | Discharge: 2021-01-03 | DRG: 948 | Disposition: A | Discharge status: Home / Self Care | Payer: Medicaid Other | Attending: General Surgery | Admitting: General Surgery

## 2020-12-30 ENCOUNTER — Emergency Department (HOSPITAL_COMMUNITY): Payer: Medicaid Other

## 2020-12-30 DIAGNOSIS — Z79899 Other long term (current) drug therapy: Secondary | ICD-10-CM

## 2020-12-30 DIAGNOSIS — Z87440 Personal history of urinary (tract) infections: Secondary | ICD-10-CM

## 2020-12-30 DIAGNOSIS — E860 Dehydration: Secondary | ICD-10-CM | POA: Diagnosis present

## 2020-12-30 DIAGNOSIS — R509 Fever, unspecified: Secondary | ICD-10-CM

## 2020-12-30 DIAGNOSIS — Z975 Presence of (intrauterine) contraceptive device: Secondary | ICD-10-CM

## 2020-12-30 DIAGNOSIS — Z87891 Personal history of nicotine dependence: Secondary | ICD-10-CM

## 2020-12-30 DIAGNOSIS — C189 Malignant neoplasm of colon, unspecified: Secondary | ICD-10-CM | POA: Diagnosis present

## 2020-12-30 DIAGNOSIS — E876 Hypokalemia: Secondary | ICD-10-CM | POA: Diagnosis present

## 2020-12-30 DIAGNOSIS — E739 Lactose intolerance, unspecified: Secondary | ICD-10-CM | POA: Diagnosis present

## 2020-12-30 DIAGNOSIS — R1011 Right upper quadrant pain: Secondary | ICD-10-CM | POA: Diagnosis present

## 2020-12-30 DIAGNOSIS — Z20822 Contact with and (suspected) exposure to covid-19: Secondary | ICD-10-CM | POA: Diagnosis present

## 2020-12-30 DIAGNOSIS — R627 Adult failure to thrive: Secondary | ICD-10-CM | POA: Diagnosis present

## 2020-12-30 DIAGNOSIS — Z888 Allergy status to other drugs, medicaments and biological substances status: Secondary | ICD-10-CM

## 2020-12-30 DIAGNOSIS — R188 Other ascites: Principal | ICD-10-CM | POA: Diagnosis present

## 2020-12-30 DIAGNOSIS — C787 Secondary malignant neoplasm of liver and intrahepatic bile duct: Secondary | ICD-10-CM | POA: Diagnosis present

## 2020-12-30 DIAGNOSIS — Z933 Colostomy status: Secondary | ICD-10-CM

## 2020-12-30 DIAGNOSIS — Z9049 Acquired absence of other specified parts of digestive tract: Secondary | ICD-10-CM

## 2020-12-30 LAB — COMPREHENSIVE METABOLIC PANEL
ALT: 49 U/L — ABNORMAL HIGH (ref 0–44)
AST: 35 U/L (ref 15–41)
Albumin: 3.3 g/dL — ABNORMAL LOW (ref 3.5–5.0)
Alkaline Phosphatase: 113 U/L (ref 38–126)
Anion gap: 11 (ref 5–15)
BUN: 10 mg/dL (ref 6–20)
CO2: 19 mmol/L — ABNORMAL LOW (ref 22–32)
Calcium: 8.4 mg/dL — ABNORMAL LOW (ref 8.9–10.3)
Chloride: 103 mmol/L (ref 98–111)
Creatinine, Ser: 0.48 mg/dL (ref 0.44–1.00)
GFR, Estimated: >60 mL/min (ref >60)
Glucose, Bld: 103 mg/dL — ABNORMAL HIGH (ref 70–99)
Potassium: 3.5 mmol/L (ref 3.5–5.1)
Sodium: 133 mmol/L — ABNORMAL LOW (ref 135–145)
Total Bilirubin: 0.9 mg/dL (ref 0.3–1.2)
Total Protein: 6.9 g/dL (ref 6.5–8.1)

## 2020-12-30 LAB — URINALYSIS, ROUTINE W REFLEX MICROSCOPIC
Bilirubin Urine: NEGATIVE
Glucose, UA: NEGATIVE mg/dL
Ketones, ur: NEGATIVE mg/dL
Nitrite: NEGATIVE
Protein, ur: NEGATIVE mg/dL
Specific Gravity, Urine: 1.008 (ref 1.005–1.030)
WBC, UA: >50 WBC/hpf — ABNORMAL HIGH (ref 0–5)
pH: 7 (ref 5.0–8.0)

## 2020-12-30 LAB — CBC WITH DIFFERENTIAL/PLATELET
Abs Immature Granulocytes: 0.03 10*3/uL (ref 0.00–0.07)
Basophils Absolute: 0 10*3/uL (ref 0.0–0.1)
Basophils Relative: 0 %
Eosinophils Absolute: 0.1 10*3/uL (ref 0.0–0.5)
Eosinophils Relative: 1 %
HCT: 46.1 % — ABNORMAL HIGH (ref 36.0–46.0)
Hemoglobin: 14.9 g/dL (ref 12.0–15.0)
Immature Granulocytes: 0 %
Lymphocytes Relative: 7 %
Lymphs Abs: 0.5 10*3/uL — ABNORMAL LOW (ref 0.7–4.0)
MCH: 30.5 pg (ref 26.0–34.0)
MCHC: 32.3 g/dL (ref 30.0–36.0)
MCV: 94.5 fL (ref 80.0–100.0)
Monocytes Absolute: 0.6 10*3/uL (ref 0.1–1.0)
Monocytes Relative: 7 %
Neutro Abs: 6.5 10*3/uL (ref 1.7–7.7)
Neutrophils Relative %: 85 %
Platelets: 146 10*3/uL — ABNORMAL LOW (ref 150–400)
RBC: 4.88 MIL/uL (ref 3.87–5.11)
RDW: 13.5 % (ref 11.5–15.5)
WBC: 7.7 10*3/uL (ref 4.0–10.5)
nRBC: 0 % (ref 0.0–0.2)

## 2020-12-30 LAB — LIPASE, BLOOD: Lipase: 32 U/L (ref 11–51)

## 2020-12-30 LAB — PREGNANCY, URINE: Preg Test, Ur: NEGATIVE

## 2020-12-30 LAB — LACTIC ACID, PLASMA: Lactic Acid, Venous: 0.9 mmol/L (ref 0.5–1.9)

## 2020-12-30 MED ORDER — SODIUM CHLORIDE 0.9 % IV SOLN
2.0000 g | INTRAVENOUS | Status: DC
  Administered 2020-12-30 – 2021-01-03 (×4): 2 g via INTRAVENOUS
  Filled 2020-12-30 (×2): qty 2
  Filled 2020-12-30: qty 20
  Filled 2020-12-30: qty 2

## 2020-12-30 MED ORDER — METOPROLOL TARTRATE 5 MG/5ML IV SOLN
5.0000 mg | Freq: Four times a day (QID) | INTRAVENOUS | Status: DC | PRN
  Filled 2020-12-30: qty 5

## 2020-12-30 MED ORDER — ONDANSETRON HCL 4 MG/2ML IJ SOLN: 4.0000 mg | Freq: Four times a day (QID) | INTRAMUSCULAR | Status: DC | PRN

## 2020-12-30 MED ORDER — IOHEXOL 9 MG/ML PO SOLN: 1000.0000 mL | ORAL | Status: AC

## 2020-12-30 MED ORDER — METRONIDAZOLE IN NACL 5-0.79 MG/ML-% IV SOLN
500.0000 mg | Freq: Three times a day (TID) | INTRAVENOUS | Status: DC
  Administered 2020-12-31 – 2021-01-03 (×11): 500 mg via INTRAVENOUS
  Filled 2020-12-30 (×11): qty 100

## 2020-12-30 MED ORDER — IOHEXOL 300 MG/ML  SOLN
100.0000 mL | Freq: Once | INTRAMUSCULAR | Status: AC | PRN
  Administered 2020-12-30: 100 mL via INTRAVENOUS

## 2020-12-30 MED ORDER — HYDROMORPHONE HCL 1 MG/ML IJ SOLN
1.0000 mg | INTRAMUSCULAR | Status: DC | PRN
  Administered 2020-12-31 (×8): 1 mg via INTRAVENOUS
  Filled 2020-12-30 (×8): qty 1

## 2020-12-30 MED ORDER — POLYETHYLENE GLYCOL 3350 17 G PO PACK: 17.0000 g | PACK | Freq: Every day | ORAL | Status: DC | PRN

## 2020-12-30 MED ORDER — KCL IN DEXTROSE-NACL 20-5-0.9 MEQ/L-%-% IV SOLN
INTRAVENOUS | Status: DC
  Filled 2020-12-30 (×6): qty 1000

## 2020-12-30 MED ORDER — ONDANSETRON 4 MG PO TBDP
4.0000 mg | ORAL_TABLET | Freq: Four times a day (QID) | ORAL | Status: DC | PRN
Start: 2020-12-30 — End: 2021-01-03

## 2020-12-30 MED ORDER — FENTANYL CITRATE (PF) 100 MCG/2ML IJ SOLN
50.0000 ug | Freq: Once | INTRAMUSCULAR | Status: AC
  Administered 2020-12-30: 50 ug via INTRAVENOUS
  Filled 2020-12-30: qty 2

## 2020-12-30 MED ORDER — SIMETHICONE 80 MG PO CHEW
40.0000 mg | CHEWABLE_TABLET | Freq: Four times a day (QID) | ORAL | Status: DC | PRN
  Filled 2020-12-30: qty 1

## 2020-12-30 MED ORDER — ENOXAPARIN SODIUM 40 MG/0.4ML ~~LOC~~ SOLN
40.0000 mg | SUBCUTANEOUS | Status: DC
  Administered 2020-12-31 – 2021-01-02 (×3): 40 mg via SUBCUTANEOUS
  Filled 2020-12-30 (×3): qty 0.4

## 2020-12-30 MED ORDER — IOHEXOL 9 MG/ML PO SOLN
ORAL | Status: AC
  Administered 2020-12-30: 500 mL via ORAL
  Filled 2020-12-30: qty 1000

## 2020-12-30 MED ORDER — SODIUM CHLORIDE 0.9 % IV BOLUS
500.0000 mL | Freq: Once | INTRAVENOUS | Status: AC
  Administered 2020-12-30: 500 mL via INTRAVENOUS

## 2020-12-30 MED ORDER — METHOCARBAMOL 500 MG PO TABS
500.0000 mg | ORAL_TABLET | Freq: Four times a day (QID) | ORAL | Status: DC | PRN
  Administered 2020-12-31 – 2021-01-03 (×3): 500 mg via ORAL
  Filled 2020-12-30 (×3): qty 1

## 2020-12-30 NOTE — ED Triage Notes (Signed)
Patient reports fever and significant abdominal pain x2 days. Hx liver resection x2 weeks ago.

## 2020-12-30 NOTE — ED Notes (Signed)
Pt requesting pain medication. MD notified. Awaiting orders. 

## 2020-12-30 NOTE — ED Notes (Signed)
Generalized body aches and fevers for the last few days

## 2020-12-30 NOTE — H&P (Signed)
Stephanie [name] is an 40 y.o. female.   Chief Complaint: Abdominal pain 2 weeks after partial liver resection HPI: Patient presents with a 1 day history of progressive right upper quadrant and right flank abdominal pain.  She also complains of fatigue.  She is 2 weeks out from a right partial hepatectomy with Dr. Barry Dienes for metastatic colon cancer.  She felt okay until about 2 days ago when she became more fatigued and had more right upper quadrant and back pain.  She had  nausea but no vomiting.  Her labs are normal.  She is quite weak and feels quite fatigued tonight.  This was a change from her previous condition.  A CT scan has been ordered by the EDP but has not been done yet.  I was asked to see her since she is within the 30-day window of surgery.  Her pain locations, right upper quadrant and right flank.  It is a 5-7 out of 10 is spasmic in nature.  She also does have some nausea.  No vomiting.  Past Medical History:  Diagnosis Date  . BV (bacterial vaginosis)   . Cancer ([place])   . Family history of bladder cancer   . Lactose intolerance 03/12/2020  . UTI (lower urinary tract infection)     Past Surgical History:  Procedure Laterality Date  . CESAREAN SECTION    . CYSTOSCOPY WITH STENT PLACEMENT  04/04/2020   Procedure: CYSTOSCOPY WITH STENT PLACEMENT;  Surgeon: [name], MD;  Location: [facility];  Service: General;;  . LAPAROSCOPIC LIVER ULTRASOUND N/A 12/13/2020   Procedure: INTRAOPERATIVE LIVER ULTRASOUND;  Surgeon: [name], MD;  Location: [facility];  Service: General;  Laterality: N/A;  . LAPAROSCOPY N/A 12/13/2020   Procedure: LAPAROSCOPY DIAGNOSTIC;  Surgeon: [name], MD;  Location: [facility];  Service: General;  Laterality: N/A;  . LAPAROTOMY N/A 04/04/2020   Procedure: EXPLORATORY LAPAROTOMY;  Surgeon: [name], MD;  Location: [facility];  Service: General;  Laterality: N/A;  . OPEN PARTIAL HEPATECTOMY  N/A 12/13/2020   Procedure: OPEN PARTIAL HEPATECTOMY;  Surgeon: [name], MD;  Location: [facility];  Service: General;  Laterality: N/A;  ROOM 2 STARTING AT 09:30AM FOR 300 MIN  . PORTACATH PLACEMENT Right 04/12/2020   Procedure: INSERTION PORT-A-CATH WITH ULTRASOUND;  Surgeon: Kinsinger, Arta Bruce, MD;  Location: [facility];  Service: General;  Laterality: Right;    Family History  Problem Relation Age of Onset  . Heart disease Father   . Stroke Father   . Aneurysm Father   . Bladder Cancer Maternal Grandmother        dx 90s  . Colon cancer Neg Hx   . Esophageal cancer Neg Hx    Social History:  reports that she has quit smoking. Her smoking use included cigarettes. She has a 5.00 pack-year smoking history. She has never used smokeless tobacco. She reports current alcohol use. She reports that she does not use drugs.  Allergies:  Allergies  Allergen Reactions  . Oxaliplatin Shortness Of Breath    (Not in a hospital admission)   Results for orders placed or performed during the hospital encounter of 12/30/20 (from the past 48 hour(s))  Comprehensive metabolic panel     Status: Abnormal   Collection Time: 12/30/20  7:57 PM  Result Value Ref Range   Sodium 133 (L) 135 - 145 mmol/L   Potassium 3.5 3.5 - 5.1 mmol/L   Chloride 103 98 - 111 mmol/L   CO2 19 (L)  22 - 32 mmol/L   Glucose, Bld 103 (H) 70 - 99 mg/dL    Comment: Glucose reference range applies only to samples taken after fasting for at least 8 hours.   BUN 10 6 - 20 mg/dL   Creatinine, Ser 0.48 0.44 - 1.00 mg/dL   Calcium 8.4 (L) 8.9 - 10.3 mg/dL   Total Protein 6.9 6.5 - 8.1 g/dL   Albumin 3.3 (L) 3.5 - 5.0 g/dL   AST 35 15 - 41 U/L   ALT 49 (H) 0 - 44 U/L   Alkaline Phosphatase 113 38 - 126 U/L   Total Bilirubin 0.9 0.3 - 1.2 mg/dL   GFR, Estimated >60 >60 mL/min    Comment: (NOTE) Calculated using the CKD-EPI Creatinine Equation (2021)    Anion gap 11 5 - 15    Comment: Performed at [facility], [place] [address], [place], [place] [address]  Lipase, blood      Status: None   Collection Time: 12/30/20  7:57 PM  Result Value Ref Range   Lipase 32 11 - 51 U/L    Comment: Performed at [facility], [place] [address] [place], [place] [address]  CBC with Differential     Status: Abnormal   Collection Time: 12/30/20  7:57 PM  Result Value Ref Range   WBC 7.7 4.0 - 10.5 K/uL   RBC 4.88 3.87 - 5.11 MIL/uL   Hemoglobin 14.9 12.0 - 15.0 g/dL   HCT 46.1 (H) 36.0 - 46.0 %   MCV 94.5 80.0 - 100.0 fL   MCH 30.5 26.0 - 34.0 pg   MCHC 32.3 30.0 - 36.0 g/dL   RDW 13.5 11.5 - 15.5 %   Platelets 146 (L) 150 - 400 K/uL   nRBC 0.0 0.0 - 0.2 %   Neutrophils Relative % 85 %   Neutro Abs 6.5 1.7 - 7.7 K/uL   Lymphocytes Relative 7 %   Lymphs Abs 0.5 (L) 0.7 - 4.0 K/uL   Monocytes Relative 7 %   Monocytes Absolute 0.6 0.1 - 1.0 K/uL   Eosinophils Relative 1 %   Eosinophils Absolute 0.1 0.0 - 0.5 K/uL   Basophils Relative 0 %   Basophils Absolute 0.0 0.0 - 0.1 K/uL   Immature Granulocytes 0 %   Abs Immature Granulocytes 0.03 0.00 - 0.07 K/uL    Comment: Performed at [facility], [place] [address] [place], [place] [address]  Lactic acid, plasma     Status: None   Collection Time: 12/30/20  7:57 PM  Result Value Ref Range   Lactic Acid, Venous 0.9 0.5 - 1.9 mmol/L    Comment: Performed at [facility], [place] [address] [place], [place] [address]  Urinalysis, Routine w reflex microscopic Urine, Clean Catch     Status: Abnormal   Collection Time: 12/30/20  8:42 PM  Result Value Ref Range   Color, Urine YELLOW YELLOW   APPearance HAZY (A) CLEAR   Specific Gravity, Urine 1.008 1.005 - 1.030   pH 7.0 5.0 - 8.0   Glucose, UA NEGATIVE NEGATIVE mg/dL   Hgb urine dipstick SMALL (A) NEGATIVE   Bilirubin Urine NEGATIVE NEGATIVE   Ketones, ur NEGATIVE NEGATIVE mg/dL   Protein, ur NEGATIVE NEGATIVE mg/dL   Nitrite NEGATIVE NEGATIVE   Leukocytes,Ua LARGE (A) NEGATIVE   RBC / HPF 0-5 0 - 5 RBC/hpf   WBC, UA >50  (H) 0 - 5 WBC/hpf   Bacteria, UA RARE (A) NONE  SEEN   Squamous Epithelial / LPF 6-10 0 - 5    Comment: Performed at [facility], [place] [address] [place], [place] [address]  Pregnancy, urine     Status: None   Collection Time: 12/30/20  8:42 PM  Result Value Ref Range   Preg Test, Ur NEGATIVE NEGATIVE    Comment:        THE SENSITIVITY OF THIS METHODOLOGY IS >20 mIU/mL. Performed at [facility], [place] [address] [place], [place] [address]    DG Chest 2 View  Result Date: 12/30/2020 CLINICAL DATA:  Fever and abdominal pain for 2 days. History of liver resection 2 weeks ago. Smoker. EXAM: CHEST - 2 VIEW COMPARISON:  12/20/2020 FINDINGS: Power port type central venous catheter with tip over the cavoatrial junction region. No pneumothorax. Heart size and pulmonary vascularity are normal. Mild atelectasis in the right base, improving since prior study. No developing airspace disease or consolidation. No pleural effusions. No pneumothorax. Small amount of free air under the right hemidiaphragm corresponds to an air-fluid level in a collection in the subdiaphragmatic space on the previous CT abdomen and pelvis from 12/19/2020. This likely represents a residual postoperative collection. A surgical drain is demonstrated in the right upper quadrant. Surgical clips in the right upper quadrant. IMPRESSION: Improving atelectasis in the right base. Small amount of free air under the right hemidiaphragm likely representing residual postoperative collection. Electronically Signed   By: Lucienne Capers M.D.   On: 12/30/2020 20:34    Review of Systems  Constitutional: Positive for appetite change and fatigue.  Gastrointestinal: Positive for abdominal pain.  All other systems reviewed and are negative.   Blood pressure 112/66, pulse 85, temperature 98 F (36.7 C), temperature source Oral, resp. rate 16, last menstrual period 12/20/2020, SpO2 99 %. Physical  Exam Constitutional:      Appearance: She is ill-appearing.  HENT:     Head: Normocephalic.  Eyes:     Extraocular Movements: Extraocular movements intact.  Cardiovascular:     Rate and Rhythm: Normal rate.     Heart sounds: Normal heart sounds.  Pulmonary:     Effort: Pulmonary effort is normal.     Breath sounds: Normal breath sounds.  Abdominal:     Palpations: Abdomen is soft.     Tenderness: There is abdominal tenderness in the right upper quadrant.    Neurological:     General: No focal deficit present.  Psychiatric:        Mood and Affect: Mood is depressed.      Assessment/Plan 2-week status post partial hepatectomy for metastatic colon cancer  New onset right upper quadrant pain with fatigue  Recommend CT scan abdomen pelvis to further evaluate operative field and abdomen.  No signs of peritonitis but is tender in the right flank.  Admit for IV fluids and antibiotics for possible abscess.  Turner Daniels, MD 12/30/2020, 9:23 PM

## 2020-12-30 NOTE — ED Notes (Signed)
Awaiting fluids from pharmacy

## 2020-12-30 NOTE — ED Provider Notes (Signed)
[facility] DEPT Provider Note   CSN: [id number] Arrival date & time: 12/30/20  1846     History Chief Complaint  Patient presents with  . Abdominal Pain    Stephanie [name] is a 40 y.o. female.  Patient is a 40 year old female who presents with fever and abdominal pain.  She has a history of metastatic colon cancer with mets to the liver.  She had a recent partial liver resection on March 17.  She also has a colostomy bag in place.  She was discharged on March 26.  She says that over the last 2 to 3 days she has had some fevers up to 100.4.  She has had some worsening abdominal pain with pain in her right upper abdomen and right mid back.  She says the pain is worse than when she was in the hospital and on the day of discharge.  She has a little bit of burning on urination.  No cough or cold symptoms.  No nausea or vomiting.  She has felt chills and diffuse myalgias.        Past Medical History:  Diagnosis Date  . BV (bacterial vaginosis)   . Cancer ([place])   . Family history of bladder cancer   . Lactose intolerance 03/12/2020  . UTI (lower urinary tract infection)     Patient Active Problem List   Diagnosis Date Noted  . Metastatic colon cancer to liver ([place]) 12/13/2020  . Family history of bladder cancer   . Port-A-Cath in place 05/03/2020  . Colon cancer ([place])   . Sepsis ([place]) 04/04/2020  . SOB (shortness of breath) 04/04/2020  . Moderate protein-calorie malnutrition ([place]) 04/04/2020  . Nausea & vomiting 04/04/2020  . TOA (tubo-ovarian abscess)   . Intra-abdominal abscess ([place]) 03/31/2020  . Lactose intolerance 03/12/2020  . Microcytic anemia 03/12/2020  . 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 Peritonitis with abscess of intestine ([place]) 03/12/2020  . Recurrent urinary tract infection 06/01/2012  . Contraception, device intrauterine 03/23/2012  . 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 Attention deficit  disorder 02/27/2010  . Anxiety state 02/27/2010    Past Surgical History:  Procedure Laterality Date  . CESAREAN SECTION    . CYSTOSCOPY WITH STENT PLACEMENT  04/04/2020   Procedure: CYSTOSCOPY WITH STENT PLACEMENT;  Surgeon: [name], MD;  Location: [facility];  Service: General;;  . LAPAROSCOPIC LIVER ULTRASOUND N/A 12/13/2020   Procedure: INTRAOPERATIVE LIVER ULTRASOUND;  Surgeon: [name], MD;  Location: [facility];  Service: General;  Laterality: N/A;  . LAPAROSCOPY N/A 12/13/2020   Procedure: LAPAROSCOPY DIAGNOSTIC;  Surgeon: [name], MD;  Location: [facility];  Service: General;  Laterality: N/A;  . LAPAROTOMY N/A 04/04/2020   Procedure: EXPLORATORY LAPAROTOMY;  Surgeon: [name], MD;  Location: [facility];  Service: General;  Laterality: N/A;  . OPEN PARTIAL HEPATECTOMY  N/A 12/13/2020   Procedure: OPEN PARTIAL HEPATECTOMY;  Surgeon: [name], MD;  Location: [facility];  Service: General;  Laterality: N/A;  ROOM 2 STARTING AT 09:30AM FOR 300 MIN  . PORTACATH PLACEMENT Right 04/12/2020   Procedure: INSERTION PORT-A-CATH WITH ULTRASOUND;  Surgeon: Kinsinger, Arta Bruce, MD;  Location: [facility];  Service: General;  Laterality: Right;     OB History    Gravida  3   Para  1   Term  1   Preterm      AB  1   Living  1     SAB  IAB  1   Ectopic      Multiple      Live Births              Family History  Problem Relation Age of Onset  . Heart disease Father   . Stroke Father   . Aneurysm Father   . Bladder Cancer Maternal Grandmother        dx 90s  . Colon cancer Neg Hx   . Esophageal cancer Neg Hx     Social History   Tobacco Use  . Smoking status: Former Smoker    Packs/day: 0.50    Years: 10.00    Pack years: 5.00    Types: Cigarettes  . Smokeless tobacco: Never Used  Vaping Use  . Vaping Use: Never used  Substance Use Topics  . Alcohol use: Yes    Comment: seldom  . Drug use: No    Home Medications Prior to Admission medications    Medication Sig Start Date End Date Taking? Authorizing Provider  acetaminophen (TYLENOL) 325 MG tablet Take 650 mg by mouth daily as needed for fever or headache (pain).    [provider]  acetaminophen (TYLENOL) 325 MG tablet Take 2 tablets (650 mg total) by mouth every 6 (six) hours as needed for mild pain (or Fever >/= 101). 12/21/20   [name], MD  diclofenac Sodium (VOLTAREN) 1 % GEL Apply 2 g topically 4 (four) times daily. Patient taking differently: Apply 2 g topically 4 (four) times daily as needed (pain). 04/16/20   Elodia Florence., MD  dicyclomine (BENTYL) 10 MG capsule Take 1 capsule (10 mg total) by mouth 3 (three) times daily before meals. Patient taking differently: Take 10 mg by mouth 2 (two) times daily as needed (stomach cramps). 07/04/20   [name], NP  lidocaine-prilocaine (EMLA) cream Apply to affected area once Patient taking differently: Apply 1 application topically as needed (prior to port access). 05/14/20   [name], MD  LORazepam (ATIVAN) 0.5 MG tablet Take 1 tablet (0.5 mg total) by mouth 2 (two) times daily as needed for anxiety. 10/24/20   [name], MD  methocarbamol (ROBAXIN) 500 MG tablet Take 1 tablet (500 mg total) by mouth every 6 (six) hours as needed for muscle spasms. 12/21/20   [name], MD  metroNIDAZOLE (FLAGYL) 500 MG tablet Take 1 tablet (500 mg total) by mouth 2 (two) times daily. Patient not taking: Reported on 11/30/2020 10/01/20   [name], MD  ondansetron (ZOFRAN) 8 MG tablet Take 1 tablet (8 mg total) by mouth 2 (two) times daily as needed. Start on day 3 after chemotherapy. Patient taking differently: Take 8 mg by mouth 2 (two) times daily as needed for nausea or vomiting. Start on day 3 after chemotherapy. 06/19/20   [name], MD  oxyCODONE (OXY IR/ROXICODONE) 5 MG immediate release tablet Take 1 tablet (5 mg total) by mouth every 4 (four) hours as needed for moderate pain, severe pain or breakthrough pain. 12/21/20    [name], MD  pantoprazole (PROTONIX) 40 MG tablet Take 1 tablet (40 mg total) by mouth 2 (two) times daily. Patient taking differently: Take 40 mg by mouth daily as needed (acid reflux). 10/24/20   [name], MD  PARAGARD INTRAUTERINE COPPER IU 1 Device by Intrauterine route once.     [provider]  polyethylene glycol (MIRALAX / GLYCOLAX) 17 g packet Take 17 g by mouth 2 (two) times daily. Patient taking differently: Take  17 g by mouth 2 (two) times daily as needed (constipation). 04/16/20   Elodia Florence., MD  potassium chloride SA (KLOR-CON) 20 MEQ tablet Take 1 tablet (20 mEq total) by mouth daily. Patient not taking: No sig reported 06/08/20   [name], MD  prochlorperazine (COMPAZINE) 10 MG tablet Take 1 tablet (10 mg total) by mouth every 6 (six) hours as needed (Nausea or vomiting). Patient not taking: Reported on 11/30/2020 06/19/20   [name], MD  senna-docusate (SENOKOT-S) 8.6-50 MG tablet Take 2 tablets by mouth 2 (two) times daily. Patient not taking: Reported on 11/30/2020 03/16/20   Damita Lack, MD  traMADol (ULTRAM) 50 MG tablet Take 1 tablet (50 mg total) by mouth every 12 (twelve) hours as needed for severe pain. 12/25/20   [name], NP  valACYclovir (VALTREX) 500 MG tablet Take 1 tablet (500 mg total) by mouth 2 (two) times daily. 08/21/20   [name], NP    Allergies    Oxaliplatin  Review of Systems   Review of Systems  Constitutional: Positive for chills, fatigue and fever. Negative for diaphoresis.  HENT: Negative for congestion, rhinorrhea and sneezing.   Eyes: Negative.   Respiratory: Negative for cough, chest tightness and shortness of breath.   Cardiovascular: Negative for chest pain and leg swelling.  Gastrointestinal: Positive for abdominal pain. Negative for blood in stool, diarrhea, nausea and vomiting.  Genitourinary: Negative for difficulty urinating, flank pain, frequency and hematuria.  Musculoskeletal: Positive for  myalgias. Negative for arthralgias and back pain.  Skin: Negative for rash.  Neurological: Negative for dizziness, speech difficulty, weakness, numbness and headaches.    Physical Exam Updated Vital Signs BP 116/67   Pulse 81   Temp 98 F (36.7 C) (Oral)   Resp 20   LMP 12/20/2020   SpO2 99%   Physical Exam Constitutional:      Appearance: She is well-developed.  HENT:     Head: Normocephalic and atraumatic.  Eyes:     Pupils: Pupils are equal, round, and reactive to light.  Cardiovascular:     Rate and Rhythm: Normal rate and regular rhythm.     Heart sounds: Normal heart sounds.  Pulmonary:     Effort: Pulmonary effort is normal. No respiratory distress.     Breath sounds: Normal breath sounds. No wheezing or rales.  Chest:     Chest wall: No tenderness.  Abdominal:     General: Bowel sounds are normal.     Palpations: Abdomen is soft.     Tenderness: There is abdominal tenderness in the right upper quadrant. There is no guarding or rebound.     Comments: Healing surgical incision in the right upper quadrant.  No drainage or signs of infection.  She has a drain in place in the right mid abdomen with some dark output.  Musculoskeletal:        General: Normal range of motion.     Cervical back: Normal range of motion and neck supple.  Lymphadenopathy:     Cervical: No cervical adenopathy.  Skin:    General: Skin is warm and dry.     Findings: No rash.  Neurological:     Mental Status: She is alert and oriented to person, place, and time.     ED Results / Procedures / Treatments   Labs (all labs ordered are listed, but only abnormal results are displayed) Labs Reviewed  COMPREHENSIVE METABOLIC PANEL - Abnormal; Notable for the following components:  Result Value   Sodium 133 (*)    CO2 19 (*)    Glucose, Bld 103 (*)    Calcium 8.4 (*)    Albumin 3.3 (*)    ALT 49 (*)    All other components within normal limits  CBC WITH DIFFERENTIAL/PLATELET -  Abnormal; Notable for the following components:   HCT 46.1 (*)    Platelets 146 (*)    Lymphs Abs 0.5 (*)    All other components within normal limits  URINALYSIS, ROUTINE W REFLEX MICROSCOPIC - Abnormal; Notable for the following components:   APPearance HAZY (*)    Hgb urine dipstick SMALL (*)    Leukocytes,Ua LARGE (*)    WBC, UA >50 (*)    Bacteria, UA RARE (*)    All other components within normal limits  CULTURE, BLOOD (ROUTINE X 2)  CULTURE, BLOOD (ROUTINE X 2)  URINE CULTURE  LIPASE, BLOOD  LACTIC ACID, PLASMA  PREGNANCY, URINE    EKG None  Radiology DG Chest 2 View  Result Date: 12/30/2020 CLINICAL DATA:  Fever and abdominal pain for 2 days. History of liver resection 2 weeks ago. Smoker. EXAM: CHEST - 2 VIEW COMPARISON:  12/20/2020 FINDINGS: Power port type central venous catheter with tip over the cavoatrial junction region. No pneumothorax. Heart size and pulmonary vascularity are normal. Mild atelectasis in the right base, improving since prior study. No developing airspace disease or consolidation. No pleural effusions. No pneumothorax. Small amount of free air under the right hemidiaphragm corresponds to an air-fluid level in a collection in the subdiaphragmatic space on the previous CT abdomen and pelvis from 12/19/2020. This likely represents a residual postoperative collection. A surgical drain is demonstrated in the right upper quadrant. Surgical clips in the right upper quadrant. IMPRESSION: Improving atelectasis in the right base. Small amount of free air under the right hemidiaphragm likely representing residual postoperative collection. Electronically Signed   By: Lucienne Capers M.D.   On: 12/30/2020 20:34   CT Abdomen Pelvis W Contrast  Result Date: 12/30/2020 CLINICAL DATA:  Fever and abdominal pain. History of liver resection 2 weeks ago. EXAM: CT ABDOMEN AND PELVIS WITH CONTRAST TECHNIQUE: Multidetector CT imaging of the abdomen and pelvis was performed using  the standard protocol following bolus administration of intravenous contrast. CONTRAST:  141mL OMNIPAQUE IOHEXOL 300 MG/ML  SOLN COMPARISON:  December 19, 2020 FINDINGS: Lower chest: Mild atelectasis is seen within the right lung base. There is a small, stable right pleural effusion. Hepatobiliary: Postoperative changes are again seen along the posterior margin of the liver parenchyma consistent with recent partial resection of the right lobe. An associated 7.7 cm x 2.7 cm posterior subcapsular gas and fluid collection is noted, with multiple adjacent surgical clips. This is very mildly decreased in size when compared to the prior study. A 2.5 cm x 2.3 cm well-defined cystic appearing area is also seen within this region, inferiorly (axial CT image 28, CT series number 2). This represents a new finding when compared to the prior study. A 9.6 cm x 2.0 cm subcapsular collection of gas and fluid is also seen along the anterior aspect of the right lobe of the liver. This is decreased in size when compared to the prior exam. An adjacent surgical drain is seen within the anteromedial supra hepatic space. Pancreas: Unremarkable. No pancreatic ductal dilatation or surrounding inflammatory changes. Spleen: Normal in size without focal abnormality. Adrenals/Urinary Tract: Adrenal glands are unremarkable. Kidneys are normal, without renal calculi, focal lesion,  or hydronephrosis. Bladder is unremarkable. Stomach/Bowel: Stomach is within normal limits. Appendix appears normal. A left lower quadrant ostomy site is seen. No evidence of bowel wall thickening, distention, or inflammatory changes. Vascular/Lymphatic: No significant vascular findings are present. No enlarged abdominal or pelvic lymph nodes. Reproductive: An IUD is seen within the uterus. A stable 1.4 cm diameter uterine fibroid is seen. Small bilateral adnexal cysts are noted. Other: No pelvic free fluid is seen. Musculoskeletal: Degenerative changes are seen within the  lower lumbar spine, at the level of L4-L5. IMPRESSION: 1. Postoperative changes consistent with recent partial resection of the right lobe of the liver with anterior and posterior subcapsular collections of gas and fluid. 2. Additional postoperative changes consistent with prior distal colectomy. 3. Small, stable right pleural effusion. 4. Small bilateral adnexal cysts, likely ovarian in origin. Electronically Signed   By: Virgina Norfolk M.D.   On: 12/30/2020 22:43    Procedures Procedures   Medications Ordered in ED Medications  sodium chloride 0.9 % bolus 500 mL (0 mLs Intravenous Stopped 12/30/20 2237)  fentaNYL (SUBLIMAZE) injection 50 mcg (50 mcg Intravenous Given 12/30/20 2007)  iohexol (OMNIPAQUE) 9 MG/ML oral solution 1,000 mL (500 mLs Oral Contrast Given 12/30/20 2219)  iohexol (OMNIPAQUE) 300 MG/ML solution 100 mL (100 mLs Intravenous Contrast Given 12/30/20 2206)  fentaNYL (SUBLIMAZE) injection 50 mcg (50 mcg Intravenous Given 12/30/20 2236)    ED Course  I have reviewed the triage vital signs and the nursing notes.  Pertinent labs & imaging results that were available during my care of the patient were reviewed by me and considered in my medical decision making (see chart for details).    MDM Rules/Calculators/A&P                          Patient is a 41 year old female who is post a partial liver resection.  This was done in mid March.  She now presents with fever and increased abdominal pain.  Her white count is normal.  She had blood cultures drawn.  Her urine is not overly consistent with infection.  It was sent for culture.  She had a CT scan which shows normal postop changes.  Dr. Brantley Stage with general surgery has seen the patient and will admit the patient for observation. Final Clinical Impression(s) / ED Diagnoses Final diagnoses:  Right upper quadrant abdominal pain  Fever, unspecified fever cause    Rx / DC Orders ED Discharge Orders    None       Malvin Johns,  MD 12/30/20 2255

## 2020-12-31 ENCOUNTER — Encounter (HOSPITAL_COMMUNITY): Payer: Self-pay | Admitting: General Surgery

## 2020-12-31 DIAGNOSIS — R1011 Right upper quadrant pain: Secondary | ICD-10-CM | POA: Diagnosis not present

## 2020-12-31 DIAGNOSIS — R188 Other ascites: Secondary | ICD-10-CM | POA: Diagnosis present

## 2020-12-31 DIAGNOSIS — Z87440 Personal history of urinary (tract) infections: Secondary | ICD-10-CM | POA: Diagnosis not present

## 2020-12-31 DIAGNOSIS — Z888 Allergy status to other drugs, medicaments and biological substances status: Secondary | ICD-10-CM | POA: Diagnosis not present

## 2020-12-31 DIAGNOSIS — C787 Secondary malignant neoplasm of liver and intrahepatic bile duct: Secondary | ICD-10-CM | POA: Diagnosis present

## 2020-12-31 DIAGNOSIS — R627 Adult failure to thrive: Secondary | ICD-10-CM | POA: Diagnosis present

## 2020-12-31 DIAGNOSIS — Z87891 Personal history of nicotine dependence: Secondary | ICD-10-CM | POA: Diagnosis not present

## 2020-12-31 DIAGNOSIS — E860 Dehydration: Secondary | ICD-10-CM | POA: Diagnosis present

## 2020-12-31 DIAGNOSIS — Z79899 Other long term (current) drug therapy: Secondary | ICD-10-CM | POA: Diagnosis not present

## 2020-12-31 DIAGNOSIS — E876 Hypokalemia: Secondary | ICD-10-CM | POA: Diagnosis present

## 2020-12-31 DIAGNOSIS — Z975 Presence of (intrauterine) contraceptive device: Secondary | ICD-10-CM | POA: Diagnosis not present

## 2020-12-31 DIAGNOSIS — E739 Lactose intolerance, unspecified: Secondary | ICD-10-CM | POA: Diagnosis present

## 2020-12-31 DIAGNOSIS — C189 Malignant neoplasm of colon, unspecified: Secondary | ICD-10-CM | POA: Diagnosis present

## 2020-12-31 DIAGNOSIS — Z20822 Contact with and (suspected) exposure to covid-19: Secondary | ICD-10-CM | POA: Diagnosis present

## 2020-12-31 DIAGNOSIS — Z933 Colostomy status: Secondary | ICD-10-CM | POA: Diagnosis not present

## 2020-12-31 DIAGNOSIS — Z9049 Acquired absence of other specified parts of digestive tract: Secondary | ICD-10-CM | POA: Diagnosis not present

## 2020-12-31 LAB — CBC
HCT: 32.1 % — ABNORMAL LOW (ref 36.0–46.0)
Hemoglobin: 10.2 g/dL — ABNORMAL LOW (ref 12.0–15.0)
MCH: 30.9 pg (ref 26.0–34.0)
MCHC: 31.8 g/dL (ref 30.0–36.0)
MCV: 97.3 fL (ref 80.0–100.0)
Platelets: 173 10*3/uL (ref 150–400)
RBC: 3.3 MIL/uL — ABNORMAL LOW (ref 3.87–5.11)
RDW: 13.6 % (ref 11.5–15.5)
WBC: 9.6 10*3/uL (ref 4.0–10.5)
nRBC: 0 % (ref 0.0–0.2)

## 2020-12-31 LAB — COMPREHENSIVE METABOLIC PANEL
ALT: 39 U/L (ref 0–44)
AST: 23 U/L (ref 15–41)
Albumin: 2.7 g/dL — ABNORMAL LOW (ref 3.5–5.0)
Alkaline Phosphatase: 91 U/L (ref 38–126)
Anion gap: 7 (ref 5–15)
BUN: 6 mg/dL (ref 6–20)
CO2: 24 mmol/L (ref 22–32)
Calcium: 8.1 mg/dL — ABNORMAL LOW (ref 8.9–10.3)
Chloride: 105 mmol/L (ref 98–111)
Creatinine, Ser: 0.45 mg/dL (ref 0.44–1.00)
GFR, Estimated: >60 mL/min (ref >60)
Glucose, Bld: 100 mg/dL — ABNORMAL HIGH (ref 70–99)
Potassium: 3.3 mmol/L — ABNORMAL LOW (ref 3.5–5.1)
Sodium: 136 mmol/L (ref 135–145)
Total Bilirubin: 0.7 mg/dL (ref 0.3–1.2)
Total Protein: 6 g/dL — ABNORMAL LOW (ref 6.5–8.1)

## 2020-12-31 LAB — RESP PANEL BY RT-PCR (FLU A&B, COVID) ARPGX2
Influenza A by PCR: NEGATIVE
Influenza B by PCR: NEGATIVE
SARS Coronavirus 2 by RT PCR: NEGATIVE

## 2020-12-31 MED ORDER — POTASSIUM CHLORIDE CRYS ER 20 MEQ PO TBCR
40.0000 meq | EXTENDED_RELEASE_TABLET | Freq: Every day | ORAL | Status: DC
  Administered 2020-12-31 – 2021-01-02 (×3): 40 meq via ORAL
  Filled 2020-12-31 (×3): qty 2

## 2020-12-31 MED ORDER — OXYCODONE HCL 5 MG PO TABS
5.0000 mg | ORAL_TABLET | ORAL | Status: DC | PRN
Start: 2020-12-31 — End: 2021-01-01
  Administered 2020-12-31 – 2021-01-01 (×2): 10 mg via ORAL
  Filled 2020-12-31 (×3): qty 2

## 2020-12-31 MED ORDER — HYDROMORPHONE HCL 1 MG/ML IJ SOLN
1.0000 mg | INTRAMUSCULAR | Status: DC | PRN
  Administered 2020-12-31 – 2021-01-03 (×10): 1 mg via INTRAVENOUS
  Filled 2020-12-31 (×11): qty 1

## 2020-12-31 MED ORDER — CHLORHEXIDINE GLUCONATE CLOTH 2 % EX PADS
6.0000 | MEDICATED_PAD | Freq: Every day | CUTANEOUS | Status: DC
  Administered 2020-12-31 – 2021-01-02 (×3): 6 via TOPICAL

## 2020-12-31 NOTE — Progress Notes (Signed)
Subjective/Chief Complaint: Pt feeling a bit better after a few doses of antibiotics and IV fluids.    Objective: Vital signs in last 24 hours: Temp:  [98 F (36.7 C)-100.3 F (37.9 C)] 99.6 F (37.6 C) (04/04 1315) Pulse Rate:  [72-104] 91 (04/04 1315) Resp:  [16-29] 16 (04/04 1315) BP: (104-128)/(59-82) 113/61 (04/04 1315) SpO2:  [97 %-100 %] 97 % (04/04 1315) Weight:  [65.3 kg] 65.3 kg (04/04 0100) Last BM Date: 12/30/20  Intake/Output from previous day: 04/03 0701 - 04/04 0700 In: 1180.7 [I.V.:491.7; IV Piggyback:689] Out: -  Intake/Output this shift: Total I/O In: 399.2 [I.V.:299.2; IV Piggyback:100] Out: 500 [Urine:475; Drains:25]  General appearance: alert, cooperative and no distress GI: soft, non distended, mild RUQ tenderness.  drain output murky, but low volume.  incision d/c/i.   Extremities: extremities normal, atraumatic, no cyanosis or edema  Lab Results:  Recent Labs    12/30/20 1957 12/31/20 0640  WBC 7.7 9.6  HGB 14.9 10.2*  HCT 46.1* 32.1*  PLT 146* 173   BMET Recent Labs    12/30/20 1957 12/31/20 0453  NA 133* 136  K 3.5 3.3*  CL 103 105  CO2 19* 24  GLUCOSE 103* 100*  BUN 10 6  CREATININE 0.48 0.45  CALCIUM 8.4* 8.1*   PT/INR No results for input(s): LABPROT, INR in the last 72 hours. ABG No results for input(s): PHART, HCO3 in the last 72 hours.  Invalid input(s): PCO2, PO2  Studies/Results: DG Chest 2 View  Result Date: 12/30/2020 CLINICAL DATA:  Fever and abdominal pain for 2 days. History of liver resection 2 weeks ago. Smoker. EXAM: CHEST - 2 VIEW COMPARISON:  12/20/2020 FINDINGS: Power port type central venous catheter with tip over the cavoatrial junction region. No pneumothorax. Heart size and pulmonary vascularity are normal. Mild atelectasis in the right base, improving since prior study. No developing airspace disease or consolidation. No pleural effusions. No pneumothorax. Small amount of free air under the right  hemidiaphragm corresponds to an air-fluid level in a collection in the subdiaphragmatic space on the previous CT abdomen and pelvis from 12/19/2020. This likely represents a residual postoperative collection. A surgical drain is demonstrated in the right upper quadrant. Surgical clips in the right upper quadrant. IMPRESSION: Improving atelectasis in the right base. Small amount of free air under the right hemidiaphragm likely representing residual postoperative collection. Electronically Signed   By: Lucienne Capers M.D.   On: 12/30/2020 20:34   CT Abdomen Pelvis W Contrast  Result Date: 12/30/2020 CLINICAL DATA:  Fever and abdominal pain. History of liver resection 2 weeks ago. EXAM: CT ABDOMEN AND PELVIS WITH CONTRAST TECHNIQUE: Multidetector CT imaging of the abdomen and pelvis was performed using the standard protocol following bolus administration of intravenous contrast. CONTRAST:  153mL OMNIPAQUE IOHEXOL 300 MG/ML  SOLN COMPARISON:  December 19, 2020 FINDINGS: Lower chest: Mild atelectasis is seen within the right lung base. There is a small, stable right pleural effusion. Hepatobiliary: Postoperative changes are again seen along the posterior margin of the liver parenchyma consistent with recent partial resection of the right lobe. An associated 7.7 cm x 2.7 cm posterior subcapsular gas and fluid collection is noted, with multiple adjacent surgical clips. This is very mildly decreased in size when compared to the prior study. A 2.5 cm x 2.3 cm well-defined cystic appearing area is also seen within this region, inferiorly (axial CT image 28, CT series number 2). This represents a new finding when compared to the prior  study. A 9.6 cm x 2.0 cm subcapsular collection of gas and fluid is also seen along the anterior aspect of the right lobe of the liver. This is decreased in size when compared to the prior exam. An adjacent surgical drain is seen within the anteromedial supra hepatic space. Pancreas:  Unremarkable. No pancreatic ductal dilatation or surrounding inflammatory changes. Spleen: Normal in size without focal abnormality. Adrenals/Urinary Tract: Adrenal glands are unremarkable. Kidneys are normal, without renal calculi, focal lesion, or hydronephrosis. Bladder is unremarkable. Stomach/Bowel: Stomach is within normal limits. Appendix appears normal. A left lower quadrant ostomy site is seen. No evidence of bowel wall thickening, distention, or inflammatory changes. Vascular/Lymphatic: No significant vascular findings are present. No enlarged abdominal or pelvic lymph nodes. Reproductive: An IUD is seen within the uterus. A stable 1.4 cm diameter uterine fibroid is seen. Small bilateral adnexal cysts are noted. Other: No pelvic free fluid is seen. Musculoskeletal: Degenerative changes are seen within the lower lumbar spine, at the level of L4-L5. IMPRESSION: 1. Postoperative changes consistent with recent partial resection of the right lobe of the liver with anterior and posterior subcapsular collections of gas and fluid. 2. Additional postoperative changes consistent with prior distal colectomy. 3. Small, stable right pleural effusion. 4. Small bilateral adnexal cysts, likely ovarian in origin. Electronically Signed   By: Virgina Norfolk M.D.   On: 12/30/2020 22:43    Anti-infectives: Anti-infectives (From admission, onward)   Start     Dose/Rate Route Frequency Ordered Stop   12/30/20 2323  cefTRIAXone (ROCEPHIN) 2 g in sodium chloride 0.9 % 100 mL IVPB       "And" Linked Group Details   2 g 200 mL/hr over 30 Minutes Intravenous Every 24 hours 12/30/20 2324     12/30/20 2323  metroNIDAZOLE (FLAGYL) IVPB 500 mg       "And" Linked Group Details   500 mg 100 mL/hr over 60 Minutes Intravenous Every 8 hours 12/30/20 2324        Assessment/Plan: s/p * No surgery found * s/p partial right hepatectomy 12/13/2020 for metastatic colon cancer with negative margins. Sent home with drain because  of slight bile tinge to mostly serosanguinous output.  CT done post op due to right flank pain and drain was right in the posterior fluid collection and drain was working.    Came back for failure to thrive and pain.  Repeat CT is improved. Drain output is down. Scan reviewed with IR and given improvement and location of fluid/air, would not attempt to drain anything.    Continue antibiotics. Continue IV fluids Adding qday KCl given hypokalemia.  Add back oxycodone for pain.       LOS: 0 days    [name] 12/31/2020

## 2021-01-01 LAB — BASIC METABOLIC PANEL
Anion gap: 6 (ref 5–15)
BUN: <5 mg/dL — ABNORMAL LOW (ref 6–20)
CO2: 28 mmol/L (ref 22–32)
Calcium: 8.3 mg/dL — ABNORMAL LOW (ref 8.9–10.3)
Chloride: 104 mmol/L (ref 98–111)
Creatinine, Ser: 0.54 mg/dL (ref 0.44–1.00)
GFR, Estimated: >60 mL/min (ref >60)
Glucose, Bld: 119 mg/dL — ABNORMAL HIGH (ref 70–99)
Potassium: 4.4 mmol/L (ref 3.5–5.1)
Sodium: 138 mmol/L (ref 135–145)

## 2021-01-01 LAB — CBC
HCT: 32.2 % — ABNORMAL LOW (ref 36.0–46.0)
Hemoglobin: 10.2 g/dL — ABNORMAL LOW (ref 12.0–15.0)
MCH: 30.8 pg (ref 26.0–34.0)
MCHC: 31.7 g/dL (ref 30.0–36.0)
MCV: 97.3 fL (ref 80.0–100.0)
Platelets: 184 10*3/uL (ref 150–400)
RBC: 3.31 MIL/uL — ABNORMAL LOW (ref 3.87–5.11)
RDW: 13.6 % (ref 11.5–15.5)
WBC: 4.5 10*3/uL (ref 4.0–10.5)
nRBC: 0 % (ref 0.0–0.2)

## 2021-01-01 LAB — URINE CULTURE

## 2021-01-01 MED ORDER — SODIUM CHLORIDE 0.9% FLUSH
10.0000 mL | Freq: Two times a day (BID) | INTRAVENOUS | Status: DC
  Administered 2021-01-01 – 2021-01-02 (×2): 10 mL

## 2021-01-01 MED ORDER — SODIUM CHLORIDE 0.9% FLUSH
10.0000 mL | INTRAVENOUS | Status: DC | PRN
  Administered 2021-01-01 – 2021-01-02 (×2): 10 mL

## 2021-01-01 MED ORDER — HYDROCODONE-ACETAMINOPHEN 5-325 MG PO TABS
1.0000 | ORAL_TABLET | ORAL | Status: DC | PRN
  Administered 2021-01-01 – 2021-01-02 (×2): 2 via ORAL
  Filled 2021-01-01 (×2): qty 2

## 2021-01-01 NOTE — TOC Initial Note (Signed)
Transition of Care [facility]) - Initial/Assessment Note   Patient Details  Name: Stephanie [name] MRN: [id number] Date of Birth: 1981-07-06  Transition of Care [facility]) CM/SW Contact:    Sherie Don, LCSW Phone Number: 01/01/2021, 11:09 AM  Clinical Narrative: Patient is a 40 year old female who was admitted for metastatic colon cancer to liver and abdominal fluid collection. Readmission checklist completed due to high readmission score.  CSW met with patient to complete assessment. Per patient, she resides alone, but her mother has been staying with her to assist following her recent surgery. Current DME includes a walker and 3N1. Patient reported she had HHRN initialy following her colostomy, but is able to manage it. Patient is able to afford her medications each month and her colostomy supplies are mailed to her on a regular basis. Patient provides her own transportation to appointments. TOC to follow for possible discharge needs.  Expected Discharge Plan: Home/Self Care Barriers to Discharge: Continued Medical Work up  Patient Goals and CMS Choice Patient states their goals for this hospitalization and ongoing recovery are:: Discharge home Choice offered to / list presented to : NA  Expected Discharge Plan and Services Expected Discharge Plan: Home/Self Care In-house Referral: Clinical Social Work Post Acute Care Choice: NA Living arrangements for the past 2 months: Single Family Home             DME Arranged: N/A DME Agency: NA  Prior Living Arrangements/Services Living arrangements for the past 2 months: Single Family Home Lives with:: Self Patient language and need for interpreter reviewed:: Yes Do you feel safe going back to the place where you live?: Yes      Need for Family Participation in Patient Care: No (Comment) Care giver support system in place?: Yes (comment) Current home services: DME (Rolling walker, 3N1) Criminal Activity/Legal Involvement Pertinent to Current  Situation/Hospitalization: No - Comment as needed  Activities of Daily Living Home Assistive Devices/Equipment: None ADL Screening (condition at time of admission) Patient's cognitive ability adequate to safely complete daily activities?: Yes Is the patient deaf or have difficulty hearing?: No Does the patient have difficulty seeing, even when wearing glasses/contacts?: No Does the patient have difficulty concentrating, remembering, or making decisions?: No Patient able to express need for assistance with ADLs?: Yes Does the patient have difficulty dressing or bathing?: No Independently performs ADLs?: Yes (appropriate for developmental age) Does the patient have difficulty walking or climbing stairs?: No Weakness of Legs: None Weakness of Arms/Hands: None  Emotional Assessment Appearance:: Appears stated age Attitude/Demeanor/Rapport: Engaged Affect (typically observed): Accepting Orientation: : Oriented to Self,Oriented to Place,Oriented to  Time,Oriented to Situation Alcohol / Substance Use: Not Applicable Psych Involvement: No (comment)  Admission diagnosis:  Right upper quadrant abdominal pain [R10.11] Metastatic colon cancer to liver ([place]) [C18.9, C78.7] Fever, unspecified fever cause [R50.9] Abdominal fluid collection [R18.8] Patient Active Problem List   Diagnosis Date Noted  . Abdominal fluid collection 12/31/2020  . Metastatic colon cancer to liver ([place]) 12/13/2020  . Family history of bladder cancer   . Port-A-Cath in place 05/03/2020  . Colon cancer ([place])   . Sepsis ([place]) 04/04/2020  . SOB (shortness of breath) 04/04/2020  . Moderate protein-calorie malnutrition ([place]) 04/04/2020  . Nausea & vomiting 04/04/2020  . TOA (tubo-ovarian abscess)   . Intra-abdominal abscess ([place]) 03/31/2020  . Lactose intolerance 03/12/2020  . Microcytic anemia 03/12/2020  . 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 Peritonitis with abscess of intestine ([place])  03/12/2020  .  Recurrent urinary tract infection 06/01/2012  . Contraception, device intrauterine 03/23/2012  . 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 Attention deficit disorder 02/27/2010  . Anxiety state 02/27/2010   PCP:  Patient, [name] (Inactive) Pharmacy:   [facility] [place], [address] [place] [address] Phone: [phone] Fax: [phone]  Readmission Risk Interventions Readmission Risk Prevention Plan 01/01/2021  Transportation Screening Complete  HRI or Home Care Consult Complete  Social Work Consult for [place] Planning/Counseling Complete  Palliative Care Screening Not Applicable  Medication Review Press photographer) Complete  Some recent data might be hidden

## 2021-01-01 NOTE — Progress Notes (Signed)
Subjective/Chief Complaint: Actually hungry today.  No nausea.  Pain slightly improved.    Objective: Vital signs in last 24 hours: Temp:  [98.3 F (36.8 C)-99.6 F (37.6 C)] 98.3 F (36.8 C) (04/05 0646) Pulse Rate:  [72-91] 72 (04/05 0646) Resp:  [16-18] 18 (04/05 0646) BP: (103-113)/(58-68) 103/58 (04/05 0646) SpO2:  [96 %-97 %] 97 % (04/05 0646) Last BM Date: 12/31/20  Intake/Output from previous day: 04/04 0701 - 04/05 0700 In: 2659.8 [P.O.:600; I.V.:1633.3; IV Piggyback:426.4] Out: 2410 [Urine:2375; Drains:35] Intake/Output this shift: No intake/output data recorded.  General appearance: alert, cooperative and no distress GI: soft, non distended, mild RUQ tenderness.  drain output less murky today but looks like old blood.  incision d/c/i.  Ostomy bag with stool and air. Extremities: extremities normal, atraumatic, no cyanosis or edema  Lab Results:  Recent Labs    12/30/20 1957 12/31/20 0640  WBC 7.7 9.6  HGB 14.9 10.2*  HCT 46.1* 32.1*  PLT 146* 173   BMET Recent Labs    12/30/20 1957 12/31/20 0453  NA 133* 136  K 3.5 3.3*  CL 103 105  CO2 19* 24  GLUCOSE 103* 100*  BUN 10 6  CREATININE 0.48 0.45  CALCIUM 8.4* 8.1*   PT/INR No results for input(s): LABPROT, INR in the last 72 hours. ABG No results for input(s): PHART, HCO3 in the last 72 hours.  Invalid input(s): PCO2, PO2  Studies/Results: DG Chest 2 View  Result Date: 12/30/2020 CLINICAL DATA:  Fever and abdominal pain for 2 days. History of liver resection 2 weeks ago. Smoker. EXAM: CHEST - 2 VIEW COMPARISON:  12/20/2020 FINDINGS: Power port type central venous catheter with tip over the cavoatrial junction region. No pneumothorax. Heart size and pulmonary vascularity are normal. Mild atelectasis in the right base, improving since prior study. No developing airspace disease or consolidation. No pleural effusions. No pneumothorax. Small amount of free air under the right hemidiaphragm  corresponds to an air-fluid level in a collection in the subdiaphragmatic space on the previous CT abdomen and pelvis from 12/19/2020. This likely represents a residual postoperative collection. A surgical drain is demonstrated in the right upper quadrant. Surgical clips in the right upper quadrant. IMPRESSION: Improving atelectasis in the right base. Small amount of free air under the right hemidiaphragm likely representing residual postoperative collection. Electronically Signed   By: Lucienne Capers M.D.   On: 12/30/2020 20:34   CT Abdomen Pelvis W Contrast  Result Date: 12/30/2020 CLINICAL DATA:  Fever and abdominal pain. History of liver resection 2 weeks ago. EXAM: CT ABDOMEN AND PELVIS WITH CONTRAST TECHNIQUE: Multidetector CT imaging of the abdomen and pelvis was performed using the standard protocol following bolus administration of intravenous contrast. CONTRAST:  14mL OMNIPAQUE IOHEXOL 300 MG/ML  SOLN COMPARISON:  December 19, 2020 FINDINGS: Lower chest: Mild atelectasis is seen within the right lung base. There is a small, stable right pleural effusion. Hepatobiliary: Postoperative changes are again seen along the posterior margin of the liver parenchyma consistent with recent partial resection of the right lobe. An associated 7.7 cm x 2.7 cm posterior subcapsular gas and fluid collection is noted, with multiple adjacent surgical clips. This is very mildly decreased in size when compared to the prior study. A 2.5 cm x 2.3 cm well-defined cystic appearing area is also seen within this region, inferiorly (axial CT image 28, CT series number 2). This represents a new finding when compared to the prior study. A 9.6 cm x 2.0 cm subcapsular  collection of gas and fluid is also seen along the anterior aspect of the right lobe of the liver. This is decreased in size when compared to the prior exam. An adjacent surgical drain is seen within the anteromedial supra hepatic space. Pancreas: Unremarkable. No  pancreatic ductal dilatation or surrounding inflammatory changes. Spleen: Normal in size without focal abnormality. Adrenals/Urinary Tract: Adrenal glands are unremarkable. Kidneys are normal, without renal calculi, focal lesion, or hydronephrosis. Bladder is unremarkable. Stomach/Bowel: Stomach is within normal limits. Appendix appears normal. A left lower quadrant ostomy site is seen. No evidence of bowel wall thickening, distention, or inflammatory changes. Vascular/Lymphatic: No significant vascular findings are present. No enlarged abdominal or pelvic lymph nodes. Reproductive: An IUD is seen within the uterus. A stable 1.4 cm diameter uterine fibroid is seen. Small bilateral adnexal cysts are noted. Other: No pelvic free fluid is seen. Musculoskeletal: Degenerative changes are seen within the lower lumbar spine, at the level of L4-L5. IMPRESSION: 1. Postoperative changes consistent with recent partial resection of the right lobe of the liver with anterior and posterior subcapsular collections of gas and fluid. 2. Additional postoperative changes consistent with prior distal colectomy. 3. Small, stable right pleural effusion. 4. Small bilateral adnexal cysts, likely ovarian in origin. Electronically Signed   By: Virgina Norfolk M.D.   On: 12/30/2020 22:43    Anti-infectives: Anti-infectives (From admission, onward)   Start     Dose/Rate Route Frequency Ordered Stop   12/30/20 2323  cefTRIAXone (ROCEPHIN) 2 g in sodium chloride 0.9 % 100 mL IVPB       "And" Linked Group Details   2 g 200 mL/hr over 30 Minutes Intravenous Every 24 hours 12/30/20 2324     12/30/20 2323  metroNIDAZOLE (FLAGYL) IVPB 500 mg       "And" Linked Group Details   500 mg 100 mL/hr over 60 Minutes Intravenous Every 8 hours 12/30/20 2324        Assessment/Plan: s/p * No surgery found * s/p partial right hepatectomy 12/13/2020 for metastatic colon cancer with negative margins. Sent home with drain because of slight bile  tinge to mostly serosanguinous output.  CT done post op due to right flank pain and drain was right in the posterior fluid collection and drain was working prior to d/c.    Came back for failure to thrive and pain.  Repeat CT is improved. Drain output is down. Scan reviewed with IR and given improvement and location of fluid/air, would not attempt to drain anything.    Continue antibiotics. Continue IV fluids qday KCl given hypokalemia.  Prn oxy and dilaudid for pain. Advance diet.   Will see how today goes with diet advance and additional IV fluids and IV antibiotics.  Anticipate d/c tomorrow or Friday.   Will plan d/c on oral antibiotics.      LOS: 1 day    [name] 01/01/2021

## 2021-01-01 NOTE — Progress Notes (Signed)
Initial Nutrition Assessment  INTERVENTION:   -Calorie count 4/5 & 4/6  -Encouraged PO intakes  NUTRITION DIAGNOSIS:   Increased nutrient needs related to cancer and cancer related treatments as evidenced by estimated needs.  GOAL:   Patient will meet greater than or equal to 90% of their needs  MONITOR:   Supplement acceptance,PO intake,Weight trends,Labs,I & O's  REASON FOR ASSESSMENT:   Consult Calorie Count  ASSESSMENT:   40 year old female who presents with fever and abdominal pain.  She has a history of metastatic colon cancer with mets to the liver.  She had a recent partial liver resection on March 17.  She also has a colostomy bag in place.  Patient in room, no family at bedside. Pt reports she has had nausea but states she has been able to eat. States her "abdominal pain" is more incisional pain.  48 hour Calorie count was ordered.  Pt consumed 100% of breakfast this morning (300 kcals, 14g protein). Will have results of Calorie count day 1 on 4/6. Pt is not interested in supplements at this time. Has ordered lunch.  Per weight records, pt has lost 9 lbs since 3/11 (5% wt loss x <1 month, significant for time frame). Possible d/t fluid.  Medications: KLOR-CON, D5 infusion   Labs reviewed: Low K   NUTRITION - FOCUSED PHYSICAL EXAM:  Deferred.  Diet Order:   Diet Order            Diet regular Room service appropriate? Yes; Fluid consistency: Thin  Diet effective now                 EDUCATION NEEDS:   No education needs have been identified at this time  Skin:  Skin Assessment: Skin Integrity Issues: Skin Integrity Issues:: Incisions Incisions: 3/17 abdomen  Last BM:  4/4 -colostomy  Height:   Ht Readings from Last 1 Encounters:  12/31/20 5\' 9"  (1.753 m)    Weight:   Wt Readings from Last 1 Encounters:  12/31/20 65.3 kg   BMI:  Body mass index is 21.27 kg/m.  Estimated Nutritional Needs:   Kcal:  1900-2100  Protein:   85-100g  Fluid:  1.9L/day  Clayton Bibles, MS, RD, LDN Inpatient Clinical Dietitian Contact information available via Amion

## 2021-01-02 LAB — BASIC METABOLIC PANEL
Anion gap: 10 (ref 5–15)
BUN: 9 mg/dL (ref 6–20)
CO2: 26 mmol/L (ref 22–32)
Calcium: 8.5 mg/dL — ABNORMAL LOW (ref 8.9–10.3)
Chloride: 103 mmol/L (ref 98–111)
Creatinine, Ser: 0.51 mg/dL (ref 0.44–1.00)
GFR, Estimated: >60 mL/min (ref >60)
Glucose, Bld: 107 mg/dL — ABNORMAL HIGH (ref 70–99)
Potassium: 3.8 mmol/L (ref 3.5–5.1)
Sodium: 139 mmol/L (ref 135–145)

## 2021-01-02 LAB — CBC
HCT: 34.2 % — ABNORMAL LOW (ref 36.0–46.0)
Hemoglobin: 10.7 g/dL — ABNORMAL LOW (ref 12.0–15.0)
MCH: 30.6 pg (ref 26.0–34.0)
MCHC: 31.3 g/dL (ref 30.0–36.0)
MCV: 97.7 fL (ref 80.0–100.0)
Platelets: 225 10*3/uL (ref 150–400)
RBC: 3.5 MIL/uL — ABNORMAL LOW (ref 3.87–5.11)
RDW: 13.6 % (ref 11.5–15.5)
WBC: 4.7 10*3/uL (ref 4.0–10.5)
nRBC: 0 % (ref 0.0–0.2)

## 2021-01-02 MED ORDER — HYDROMORPHONE HCL 2 MG PO TABS: 2.0000 mg | ORAL_TABLET | ORAL | 0 refills | Status: DC | PRN

## 2021-01-02 MED ORDER — POLYETHYLENE GLYCOL 3350 17 G PO PACK
17.0000 g | PACK | Freq: Every day | ORAL | Status: DC
  Administered 2021-01-02: 17 g via ORAL
  Filled 2021-01-02 (×2): qty 1

## 2021-01-02 MED ORDER — SENNOSIDES-DOCUSATE SODIUM 8.6-50 MG PO TABS
1.0000 | ORAL_TABLET | Freq: Two times a day (BID) | ORAL | Status: DC
  Administered 2021-01-02 (×2): 1 via ORAL
  Filled 2021-01-02 (×2): qty 1

## 2021-01-02 MED ORDER — HYDROMORPHONE HCL 2 MG PO TABS
2.0000 mg | ORAL_TABLET | ORAL | Status: DC | PRN
  Administered 2021-01-02: 3 mg via ORAL
  Administered 2021-01-02 (×2): 2 mg via ORAL
  Filled 2021-01-02: qty 1
  Filled 2021-01-02 (×2): qty 2
  Filled 2021-01-02 (×2): qty 1

## 2021-01-02 MED ORDER — AMOXICILLIN-POT CLAVULANATE 875-125 MG PO TABS: 1.0000 | ORAL_TABLET | Freq: Two times a day (BID) | ORAL | 0 refills | Status: DC

## 2021-01-02 NOTE — Discharge Summary (Signed)
Physician Discharge Summary  Patient ID: Stephanie [name] MRN: [id number] DOB/AGE: January 16, 1981 40 y.o.  Admit date: 12/30/2020 Discharge date: 01/02/2021  Admission Diagnoses: Dehydration Pain Failure to thrive Abdominal fluid collection  Discharge Diagnoses:  Active Problems:   Metastatic colon cancer to liver [facility])   Abdominal fluid collection and same as above  Discharged Condition: improved  Hospital Course:  Pt was admitted from the ED with failure to thrive, pain and dehydration following discharge from a partial right hepatectomy 12/13/2020 (discharged 3/26).  She got a CT that showed some improvement in fluid collection compared to pre discharge.  However, her drain output was a bit murky in addition to being a bit bile stained.  She was given IV fluids and placed back on IV pain meds and antibiotics.  Her appetite started to improve.  She at first wasn't stooling much into her ostomy appliance, but this improved after starting regular food and taking miralax.  Her drain is present in the small residual posterior fluid collection.  The small fluid collection anterior to the liver has shrunk in size.  She was converted back to oral narcotics and was discharged in improved condition.    Consults: None  Significant Diagnostic Studies: labs: WBCs 4.7k, HCT 33.6, plts 225k prior to d/c.  Cr 0.39  Treatments: IV antibiotics, IV fluids, IV pain medication  Discharge Exam: Blood pressure 101/62, pulse 69, temperature 97.8 F (36.6 C), temperature source Oral, resp. rate 17, height 5\' 9"  (1.753 m), weight 65.3 kg, last menstrual period 12/20/2020, SpO2 96 %. General appearance: alert, cooperative and no distress Resp: breathing comfortably GI: soft, non distended, approp tender, incision c/d/i.  small amount of slightly murky fluid that appears to be old blood Extremities: extremities normal, atraumatic, no cyanosis or edema  Disposition:  There are no questions and answers to  display.        Discharge Instructions    Call MD for:  difficulty breathing, headache or visual disturbances   Complete by: As directed    Call MD for:  hives   Complete by: As directed    Call MD for:  persistant nausea and vomiting   Complete by: As directed    Call MD for:  redness, tenderness, or signs of infection (pain, swelling, redness, odor or green/yellow discharge around incision site)   Complete by: As directed    Call MD for:  severe uncontrolled pain   Complete by: As directed    Call MD for:  temperature >100.4   Complete by: As directed    Change dressing (specify)   Complete by: As directed    Measure and record drain output 1-2 times per day.  Bring record to clinic.  Let us know if it is <5 mL for 3 days in a row prior to your follow up appt.   Diet - low sodium heart healthy   Complete by: As directed    Increase activity slowly   Complete by: As directed      Allergies as of 01/02/2021      Reactions   Oxaliplatin Shortness Of Breath      Medication List    STOP taking these medications   metroNIDAZOLE 500 MG tablet Commonly known as: FLAGYL   oxyCODONE 5 MG immediate release tablet Commonly known as: Oxy IR/ROXICODONE   valACYclovir 500 MG tablet Commonly known as: VALTREX     TAKE these medications   acetaminophen 325 MG tablet Commonly known as: TYLENOL Take 650 mg  by mouth daily as needed for fever or headache (pain).   acetaminophen 325 MG tablet Commonly known as: TYLENOL Take 2 tablets (650 mg total) by mouth every 6 (six) hours as needed for mild pain (or Fever >/= 101).   amoxicillin-clavulanate 875-125 MG tablet Commonly known as: Augmentin Take 1 tablet by mouth 2 (two) times daily for 14 days.   HYDROmorphone 2 MG tablet Commonly known as: DILAUDID Take 1-2 tablets (2-4 mg total) by mouth every 4 (four) hours as needed for moderate pain.   ibuprofen 200 MG tablet Commonly known as: ADVIL Take 800 mg by mouth every 6  (six) hours as needed for fever, headache or mild pain.   LORazepam 0.5 MG tablet Commonly known as: ATIVAN Take 1 tablet (0.5 mg total) by mouth 2 (two) times daily as needed for anxiety.   methocarbamol 500 MG tablet Commonly known as: ROBAXIN Take 1 tablet (500 mg total) by mouth every 6 (six) hours as needed for muscle spasms.   pantoprazole 40 MG tablet Commonly known as: PROTONIX Take 1 tablet (40 mg total) by mouth 2 (two) times daily.   PARAGARD INTRAUTERINE COPPER IU 1 Device by Intrauterine route once.   potassium chloride SA 20 MEQ tablet Commonly known as: KLOR-CON Take 1 tablet (20 mEq total) by mouth daily.   prochlorperazine 10 MG tablet Commonly known as: COMPAZINE Take 1 tablet (10 mg total) by mouth every 6 (six) hours as needed (Nausea or vomiting).   senna-docusate 8.6-50 MG tablet Commonly known as: Senokot-S Take 2 tablets by mouth 2 (two) times daily.   traMADol 50 MG tablet Commonly known as: ULTRAM Take 1 tablet (50 mg total) by mouth every 12 (twelve) hours as needed for severe pain.            Discharge Care Instructions  (From admission, onward)         Start     Ordered   01/02/21 0000  Change dressing (specify)       Comments: Measure and record drain output 1-2 times per day.  Bring record to clinic.  Let us know if it is <5 mL for 3 days in a row prior to your follow up appt.   01/02/21 0908          Follow-up Information    [name], MD Follow up in 2 week(s).   Specialty: General Surgery Contact information: [address] [place] [address] [phone]               Signed: [name] 01/02/2021, 9:09 AM

## 2021-01-02 NOTE — Progress Notes (Signed)
   Subjective/Chief Complaint: Got sweats with oxycodone.  Switched to norco, but not effective.  Still needing multiple doses of IV pain meds.  No good stools since readmission.  Did tolerate reg dinner last night and feels hungry.  Objective: Vital signs in last 24 hours: Temp:  [97.8 F (36.6 C)-98.8 F (37.1 C)] 97.8 F (36.6 C) (04/06 0534) Pulse Rate:  [69-78] 69 (04/06 0534) Resp:  [16-18] 17 (04/06 0534) BP: (101-107)/(61-67) 101/62 (04/06 0534) SpO2:  [96 %-97 %] 96 % (04/06 0534) Last BM Date: 12/31/20  Intake/Output from previous day: 04/05 0701 - 04/06 0700 In: 2326.8 [P.O.:600; I.V.:1326.8; IV Piggyback:400] Out: 3095 [Urine:3050; Drains:45] Intake/Output this shift: No intake/output data recorded.  General appearance: alert, cooperative and no distress GI: soft, non distended, mild RUQ tenderness.  drain output less murky today but looks like old blood.  incision d/c/i.  Ostomy bag with small amount of stool and air. Extremities: extremities normal, atraumatic, no cyanosis or edema  Lab Results:  Recent Labs    01/01/21 1336 01/02/21 0401  WBC 4.5 4.7  HGB 10.2* 10.7*  HCT 32.2* 34.2*  PLT 184 225   BMET Recent Labs    01/01/21 1336 01/02/21 0401  NA 138 139  K 4.4 3.8  CL 104 103  CO2 28 26  GLUCOSE 119* 107*  BUN <5* 9  CREATININE 0.54 0.51  CALCIUM 8.3* 8.5*   PT/INR No results for input(s): LABPROT, INR in the last 72 hours. ABG No results for input(s): PHART, HCO3 in the last 72 hours.  Invalid input(s): PCO2, PO2  Studies/Results: No results found.  Anti-infectives: Anti-infectives (From admission, onward)   Start     Dose/Rate Route Frequency Ordered Stop   01/02/21 0000  amoxicillin-clavulanate (AUGMENTIN) 875-125 MG tablet        1 tablet Oral 2 times daily 01/02/21 0908 01/16/21 2359   12/30/20 2323  cefTRIAXone (ROCEPHIN) 2 g in sodium chloride 0.9 % 100 mL IVPB       "And" Linked Group Details   2 g 200 mL/hr over 30  Minutes Intravenous Every 24 hours 12/30/20 2324     12/30/20 2323  metroNIDAZOLE (FLAGYL) IVPB 500 mg       "And" Linked Group Details   500 mg 100 mL/hr over 60 Minutes Intravenous Every 8 hours 12/30/20 2324        Assessment/Plan: s/p * No surgery found * s/p partial right hepatectomy 12/13/2020 for metastatic colon cancer with negative margins. Sent home with drain because of slight bile tinge to mostly serosanguinous output.  CT done post op due to right flank pain and drain was right in the posterior fluid collection and drain was working prior to d/c.    Came back for failure to thrive and pain.  Repeat CT is improved. Drain output is down. Scan reviewed with IR and given improvement and location of fluid/air, would not attempt to drain anything.    Continue antibiotics. qday KCl given hypokalemia.  Prn oral dilaudid and IV for breakthrough pain. Diet as tolerated.   If pain control adequate with oral dilaudid, will d/c this afternoon on augmentin.    Will plan d/c on oral antibiotics.      LOS: 2 days    Stephanie [name] 01/02/2021

## 2021-01-02 NOTE — Discharge Instructions (Addendum)
Surgical Drain Home Care Surgical drains are used to remove extra fluid that normally builds up in a surgical wound after surgery. A surgical drain helps to heal a surgical wound. Different kinds of surgical drains include:  Active drains. These drains use suction to pull drainage away from the surgical wound. Drainage flows through a tube to a container outside of the body. With these drains, you need to keep the bulb or the drainage container flat (compressed) at all times, except while you empty it. Flattening the bulb or container creates suction.  Passive drains. These drains allow fluid to drain naturally, by gravity. Drainage flows through a tube to a bandage (dressing) or a container outside of the body. Passive drains do not need to be emptied. A drain is placed during surgery. Right after surgery, drainage is usually bright red and a little thicker than water. The drainage may gradually turn yellow or pink and become thinner. It is likely that your health care provider will remove the drain when the drainage stops or when the amount decreases to 1-2 Tbsp (15-30 mL) during a 24-hour period. Supplies needed:  Tape.  Germ-free cleaning solution (sterile saline).  Cotton swabs.  Split gauze drain sponge: 4 x 4 inches (10 x 10 cm).  Gauze square: 4 x 4 inches (10 x 10 cm). How to care for your surgical drain Care for your drain as told by your health care provider. This is important to help prevent infection. If your drain is placed at your back, or any other hard-to-reach area, ask another person to assist you in performing the following tasks: General care  Keep the skin around the drain dry and covered with a dressing at all times.  Check your drain area every day for signs of infection. Check for: ? Redness, swelling, or pain. ? Pus or a bad smell. ? Cloudy drainage. ? Tenderness or pressure at the drain exit site. Changing the dressing Follow instructions from your health care  provider about how to change your dressing. Change your dressing at least once a day. Change it more often if needed to keep the dressing dry. Make sure you: 1. Gather your supplies. 2. Wash your hands with soap and water before you change your dressing. If soap and water are not available, use hand sanitizer. 3. Remove the old dressing. Avoid using scissors to do that. 4. Wash your hands with soap and water again after removing the old dressing. 5. Use sterile saline to clean your skin around the drain. You may need to use a cotton swab to clean the skin. 6. Place the tube through the slit in a drain sponge. Place the drain sponge so that it covers your wound. 7. Place the gauze square or another drain sponge on top of the drain sponge that is on the wound. Make sure the tube is between those layers. 8. Tape the dressing to your skin. 9. Tape the drainage tube to your skin 1-2 inches (2.5-5 cm) below the place where the tube enters your body. Taping keeps the tube from pulling on any stitches (sutures) that you have. 10. Wash your hands with soap and water. 11. Write down the color of your drainage and how often you change your dressing. How to empty your active drain 1. Make sure that you have a measuring cup that you can empty your drainage into. 2. Wash your hands with soap and water. If soap and water are not available, use hand sanitizer. 3. Loosen   any pins or clips that hold the tube in place. 4. If your health care provider tells you to strip the tube to prevent clots and tube blockages: ? Hold the tube at the skin with one hand. Use your other hand to pinch the tubing with your thumb and first finger. ? Gently move your fingers down the tube while squeezing very lightly. This clears any drainage, clots, or tissue from the tube. ? You may need to do this several times each day to keep the tube clear. Do not pull on the tube. 5. Open the bulb cap or the drain plug. Do not touch the inside  of the cap or the bottom of the plug. 6. Turn the device upside down and gently squeeze. 7. Empty all of the drainage into the measuring cup. 8. Compress the bulb or the container and replace the cap or the plug. To compress the bulb or the container, squeeze it firmly in the middle while you close the cap or plug the container. 9. Write down the amount of drainage that you have in each 24-hour period. If you have less than 2 Tbsp (30 mL) of drainage during 24 hours, contact your health care provider. 10. Flush the drainage down the toilet. 11. Wash your hands with soap and water.   Contact a health care provider if:  You have redness, swelling, or pain around your drain area.  You have pus or a bad smell coming from your drain area.  You have a fever or chills.  The skin around your drain is warm to the touch.  The amount of drainage that you have is increasing instead of decreasing.  You have drainage that is cloudy.  There is a sudden stop or a sudden decrease in the amount of drainage that you have.  Your drain tube falls out.  Your active drain does not stay compressed after you empty it. Summary  Surgical drains are used to remove extra fluid that normally builds up in a surgical wound after surgery.  Different kinds of surgical drains include active drains and passive drains. Active drains use suction to pull drainage away from the surgical wound, and passive drains allow fluid to drain naturally.  It is important to care for your drain to prevent infection. If your drain is placed at your back, or any other hard-to-reach area, ask another person to assist you.  Contact your health care provider if you have redness, swelling, or pain around your drain area. This information is not intended to replace advice given to you by your health care provider. Make sure you discuss any questions you have with your health care provider. Document Revised: 10/20/2018 Document Reviewed:  10/20/2018 Elsevier Patient Education  2021 [place]!  I have requested samples of [employer] [place] to be delivered to your home.  Please reach out with any questions!  [place] # [id number]

## 2021-01-02 NOTE — Progress Notes (Signed)
Calorie Count Note  48 hour calorie count ordered.   Spoke with pt at bedside. Pt reports better pain control and increased appetite. She reports that she primarily eats baked chicken, fruits, and vegetables at home with protein cookies from [employer] (1 g carb and 15 g protein per cookie). Encouraged her to try [employer] protein supplements as a lactose free option at home, given pt reports she is lactose intolerant. Will attach information in discharge summary regarding how to receive a free box of [employer] nutrition shakes.  Diet: Regular Supplements: None  4/5 Lunch: 570 kcal 4/6 Lunch: 595 kcal  Total intake for 2 meals: 1165 kcal (58% of minimum estimated needs)  54 protein (60% of minimum estimated needs)  Nutrition Dx: Increased nutrient needs related to cancer and cancer related treatments as evidenced by estimated needs. - ongoing  Goal: Patient will meet greater than or equal to 90% of their needs. - met  Intervention:  -Calorie count 4/5 & 4/6 -Encouraged PO intakes  Megan Tincher, RD, LDN Registered Dietitian After Hours/Weekend Pager # in Amion 

## 2021-01-03 ENCOUNTER — Telehealth: Payer: Self-pay | Admitting: Hematology

## 2021-01-03 LAB — CBC
HCT: 33.6 % — ABNORMAL LOW (ref 36.0–46.0)
Hemoglobin: 10.5 g/dL — ABNORMAL LOW (ref 12.0–15.0)
MCH: 29.8 pg (ref 26.0–34.0)
MCHC: 31.3 g/dL (ref 30.0–36.0)
MCV: 95.5 fL (ref 80.0–100.0)
Platelets: 225 10*3/uL (ref 150–400)
RBC: 3.52 MIL/uL — ABNORMAL LOW (ref 3.87–5.11)
RDW: 13.5 % (ref 11.5–15.5)
WBC: 4.7 10*3/uL (ref 4.0–10.5)
nRBC: 0 % (ref 0.0–0.2)

## 2021-01-03 LAB — BASIC METABOLIC PANEL
Anion gap: 10 (ref 5–15)
BUN: 8 mg/dL (ref 6–20)
CO2: 26 mmol/L (ref 22–32)
Calcium: 8.6 mg/dL — ABNORMAL LOW (ref 8.9–10.3)
Chloride: 102 mmol/L (ref 98–111)
Creatinine, Ser: 0.39 mg/dL — ABNORMAL LOW (ref 0.44–1.00)
GFR, Estimated: >60 mL/min (ref >60)
Glucose, Bld: 93 mg/dL (ref 70–99)
Potassium: 3.9 mmol/L (ref 3.5–5.1)
Sodium: 138 mmol/L (ref 135–145)

## 2021-01-03 MED ORDER — HEPARIN SOD (PORK) LOCK FLUSH 100 UNIT/ML IV SOLN
500.0000 [IU] | INTRAVENOUS | Status: AC | PRN
  Administered 2021-01-03: 500 [IU]
  Filled 2021-01-03: qty 5

## 2021-01-03 NOTE — Progress Notes (Signed)
Pt alert and oriented, tolerating diet. D/C orders given, d/cd to home.

## 2021-01-03 NOTE — Telephone Encounter (Signed)
Spoke with patient she is aware

## 2021-01-03 NOTE — Progress Notes (Signed)
Pt stated pain 10/10, attempted to give dilaudid 4 mg, pt refused stated she wanted the 1 mg IV because it would work faster. Attempted to educate on pain intensity and medication dosage. She stated that she didn't care, to give her the IV meds.

## 2021-01-05 LAB — CULTURE, BLOOD (ROUTINE X 2)
Culture: NO GROWTH
Culture: NO GROWTH

## 2021-01-07 ENCOUNTER — Ambulatory Visit: Payer: Medicaid Other | Attending: General Surgery | Admitting: Physical Therapy

## 2021-01-07 ENCOUNTER — Other Ambulatory Visit: Payer: Self-pay | Admitting: Hematology

## 2021-01-08 MED ORDER — LORAZEPAM 0.5 MG PO TABS: 0.5000 mg | ORAL_TABLET | Freq: Two times a day (BID) | ORAL | 0 refills | Status: DC | PRN

## 2021-01-08 NOTE — Telephone Encounter (Signed)
Refill

## 2021-01-14 ENCOUNTER — Inpatient Hospital Stay: Payer: Medicaid Other

## 2021-01-14 ENCOUNTER — Inpatient Hospital Stay: Payer: Medicaid Other | Admitting: Hematology

## 2021-01-16 ENCOUNTER — Telehealth: Payer: Self-pay | Admitting: Hematology

## 2021-01-16 NOTE — Telephone Encounter (Signed)
Called pt to see if she would be willing to move appt to afternoon. Due to transportation issues pt is only able to come in the mornings. I left the appt the way it is and sent a msg to MD.

## 2021-01-16 NOTE — Progress Notes (Signed)
[place]   [phone] [phone]   Clinic Follow up Note   Patient Care Team: Patient, [name] (Inactive) as PCP - General (General Practice) [name], RN as Oncology Nurse Navigator [name], NP as Nurse Practitioner (Nurse Practitioner) [name], MD as Consulting Physician (Oncology) [name], MD as Consulting Physician (General Surgery)  Date of Service:  01/17/2021  CHIEF COMPLAINT: f/u of metastatic colon cancer  SUMMARY OF ONCOLOGIC HISTORY: Oncology History Overview Note  Cancer Staging Malignant neoplasm of rectosigmoid junction [facility]) Staging form: Colon and Rectum, AJCC 8th Edition - Pathologic stage from 04/04/2020: pT4a, pN1b, cM1 - Signed by [name], NP on 05/07/2020    Malignant neoplasm of rectosigmoid junction ([place])  11/10/2019 Imaging   MRI Abdomen  IMPRESSION: 1. Redemonstrated hypoenhancing lesion of the posterior liver dome, hepatic segment VII, reduced in size compared to prior examination, measuring 1.3 x 1.3 cm, previously 1.8 x 1.7 cm. Findings are consistent with treatment response of a biopsy proven metastasis. No other evidence of lymphadenopathy or metastatic disease within the abdomen or pelvis. 2. Unchanged mild splenomegaly, maximum coronal span 14.0 cm. 3. 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   03/12/2020 Initial Diagnosis   Malignant neoplasm of rectosigmoid junction ([place])   03/12/2020 Imaging   CT AP with contrast IMPRESSION: 1. Overall findings are highly concerning for colorectal carcinoma involving the sigmoid colon with an associated perforation and adjacent abscess and phlegmon formation as detailed above. Currently, no collection is amenable to percutaneous drainage given their small size and location. 2. New 2 cm mass in the right hepatic lobe concerning for metastatic disease to the liver until proven otherwise. 3. Enlarged  regional lymph nodes as detailed above is concerning for nodal metastatic disease. 4. Large stool burden. 5. Prominent pelvic veins which can be seen in patients with pelvic congestion syndrome.   03/13/2020 Imaging   ABD US IMPRESSION: Approximately 2.1 x 2.4 x 2.0 cm lobular homogeneously echogenic lesion in the right hepatic dome corresponds with the abnormality seen on the prior CT scan. Sonographically, this appearance is highly suggestive of a benign hemangioma.   Recommend MRI of the abdomen with gadolinium contrast which may provide a noninvasive diagnosis of benign hemangioma.    03/13/2020 Imaging   MR ABD W/WO CONTRAST Hepatobiliary: Diffuse low signal intensity throughout the hepatic parenchyma on T2 weighted images, presumably a consequence of recent Feraheme injection. In segment 7 of the liver (axial image 8 of series 5) there is a 2.5 x 1.9 cm well-defined lesion which is slightly T2 hyperintense. This lesion appears hyperintense on pre gadolinium T1 weighted images (likely a consequence of Feraheme). Interpretation of enhancement within the lesion is compromised by presence of Feraheme. No other hepatic lesions are confidently identified on today's examination. No intra or extrahepatic biliary ductal dilatation. Gallbladder is normal in appearance.   03/31/2020 Imaging   CT AP W contrast IMPRESSION: 1. Previously noted sigmoid colon mass appears increased in size, and again appears to be associated with a focal contained perforation which crosses the midline and has fistulized into the left adnexal region where there is now what appears to be a large left tubo-ovarian abscess, as detailed above. This is also associated with multiple enlarged lymph nodes in the pelvis measuring up to 1.2 cm in short axis and borderline enlarged retroperitoneal lymph nodes, concerning for metastatic disease. In addition, previously suspected metastatic lesion in segment 7 of  the liver has enlarged. 2. Small volume of ascites. 3. Additional incidental findings, as above.   04/04/2020 Cancer Staging   Staging form: Colon and Rectum, AJCC 8th Edition - Pathologic stage from 04/04/2020: pT4a, pN1b, cM1 - Signed by [name], NP on 05/07/2020   04/04/2020 Procedure   Paracentesis, path showed no malignant cells (mixed acute and chronic inflammation present)   04/04/2020 Surgery   Open sigmoid colectomy and end colostomy by Dr. [name]   04/04/2020 Pathology Results   FINAL MICROSCOPIC DIAGNOSIS: A. COLON, RECTOSIGMOID, RESECTION: - Invasive moderately differentiated adenocarcinoma, 6 cm, involving rectosigmoid junction - Carcinoma invades into serosal surface with perforation and associated serositis - Radial resection margin is positive for carcinoma; proximal and distal margins are not involved - Lymphovascular invasion is present - Metastatic carcinoma to one of fifteen lymph nodes (1/15); one tumor deposit - See oncology table B. LYMPH NODES, MESENTERIC, RESECTION: - Metastatic adenocarcinoma to one of six lymph nodes (1/6) - One tumor deposit  Addendum to note 2 involved lymph nodes (of 21 examined nodes) pT4a,pN1b MMR-normal, preserved expression of MLH1, MSH2, MSH6, PMS2   04/12/2020 Procedure   PAC placement    04/13/2020 Imaging   CT chest without contrast IMPRESSION: Interval development of bilateral pleural effusions, left slightly greater than right, with resultant bibasilar atelectasis including subtotal collapse of the left lower lobe. No evidence of intrathoracic metastatic disease, though evaluation of the collapsed parenchyma is limited. Hepatic metastasis again demonstrated.     04/16/2020 Pathology Results   FINAL MICROSCOPIC DIAGNOSIS:  A. LIVER, RIGHT LOBE, BIOPSY:  - Adenocarcinoma.  COMMENT:  The morphology is compatible with the provided clinical history of colorectal carcinoma.    05/23/2020 - 10/24/2020 Chemotherapy    FOLFIRINOX q2weeks for 3-6 months starting 05/23/20. Bevacizumab-bvzr [name] added with C2. Oxaliplatin held with C11-12 due to reaction. (pt developed SOB, chest palpitation and abdominal discomfort shortly after oxaliplatin started). Completed on 10/24/20.   08/05/2020 Imaging   CT AP  IMPRESSION: 1. Postsurgical changes of distal colectomy with a left lower quadrant end ostomy. No evidence of obstruction or acute complication at this time. Excluded rectal pouch in the deep pelvis without acute complication or worrisome features. 2. Slight interval decrease in size of a hypoattenuating lesion posterior right lobe liver measuring 1.7 x 1.8 x 2 cm. This lesion has previously undergone ultrasound-guided biopsy with pathologic results demonstrating adenocarcinoma compatible with metastatic disease from patient's resected colorectal carcinoma. 3. Slight prominence of the parametrial vessels bilaterally, nonspecific though can be seen in the setting of pelvic congestion syndrome. 4. Mild splenomegaly.  No focal lesion.     11/12/2020 Imaging   CT Chest  IMPRESSION: 1. No evidence of metastatic disease in the chest. 2. 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   12/13/2020 Surgery   A. LIVER, RIGHT, PARTIAL HEPATECTOMY WITH GALLBLADDER:  - Metastatic colon carcinoma to the liver showing approximately 80%  necrosis  - Resection margin is 0.8 cm from carcinoma  - Uninvolved liver parenchyma with no specific histopathologic changes  - Gallbladder with no specific histopathologic changes       CURRENT THERAPY:  Surveillance  INTERVAL HISTORY:  Stephanie [name] is here for a follow up of metastatic colon cancer. She was last seen by me on 11/15/20. She presents to the clinic alone. She notes she is recovering from surgery well. She is taking tramadol very infrequently, maybe every other day. She notes she has  been taking it more the last week  because she has been trying to walk more. She notes she is gaining back the weight she lost while in the hospital. She is supplementing with protein shakes in the morning. She notes her bowel movements are good. She continues to have drainage, but this continues to lessen. She reports continued numbness in her hands and feet. She notes she does drop small things.  REVIEW OF SYSTEMS:   Constitutional: Denies fevers, chills or abnormal weight loss Eyes: Denies blurriness of vision Ears, nose, mouth, throat, and face: Denies mucositis or sore throat Respiratory: Denies cough, dyspnea or wheezes Cardiovascular: Denies palpitation, chest discomfort or lower extremity swelling Gastrointestinal:  Denies nausea, heartburn or change in bowel habits Skin: Denies abnormal skin rashes Lymphatics: Denies new lymphadenopathy or easy bruising Neurological:Denies tingling or new weaknesses, (+) numbness in fingers and toes Behavioral/Psych: Mood is stable, no new changes  All other systems were reviewed with the patient and are negative.  MEDICAL HISTORY:  Past Medical History:  Diagnosis Date  . BV (bacterial vaginosis)   . Cancer ([place])   . Family history of bladder cancer   . Lactose intolerance 03/12/2020  . UTI (lower urinary tract infection)     SURGICAL HISTORY: Past Surgical History:  Procedure Laterality Date  . CESAREAN SECTION    . CYSTOSCOPY WITH STENT PLACEMENT  04/04/2020   Procedure: CYSTOSCOPY WITH STENT PLACEMENT;  Surgeon: [name], MD;  Location: [facility];  Service: General;;  . LAPAROSCOPIC LIVER ULTRASOUND N/A 12/13/2020   Procedure: INTRAOPERATIVE LIVER ULTRASOUND;  Surgeon: [name], MD;  Location: [facility];  Service: General;  Laterality: N/A;  . LAPAROSCOPY N/A 12/13/2020   Procedure: LAPAROSCOPY DIAGNOSTIC;  Surgeon: [name], MD;  Location: [place];  Service: General;  Laterality: N/A;  . LAPAROTOMY N/A 04/04/2020   Procedure: EXPLORATORY LAPAROTOMY;  Surgeon: [name], MD;  Location: [facility];  Service: General;  Laterality: N/A;  . OPEN PARTIAL HEPATECTOMY  N/A 12/13/2020   Procedure: OPEN PARTIAL HEPATECTOMY;  Surgeon: [name], MD;  Location: [facility];  Service: General;  Laterality: N/A;  ROOM 2 STARTING AT 09:30AM FOR 300 MIN  . PORTACATH PLACEMENT Right 04/12/2020   Procedure: INSERTION PORT-A-CATH WITH ULTRASOUND;  Surgeon: Kinsinger, Arta Bruce, MD;  Location: [facility];  Service: General;  Laterality: Right;    I have reviewed the social history and family history with the patient and they are unchanged from previous note.  ALLERGIES:  is allergic to oxaliplatin.  MEDICATIONS:  Current Outpatient Medications  Medication Sig Dispense Refill  . acetaminophen (TYLENOL) 325 MG tablet Take 650 mg by mouth daily as needed for fever or headache (pain).    [name] acetaminophen (TYLENOL) 325 MG tablet Take 2 tablets (650 mg total) by mouth every 6 (six) hours as needed for mild pain (or Fever >/= 101).    [name] ibuprofen (ADVIL) 200 MG tablet Take 800 mg by mouth every 6 (six) hours as needed for fever, headache or mild pain.    [name] LORazepam (ATIVAN) 0.5 MG tablet Take 1 tablet (0.5 mg total) by mouth 2 (two) times daily as needed for anxiety. 30 tablet 0  . methocarbamol (ROBAXIN) 500 MG tablet Take 1 tablet (500 mg total) by mouth every 6 (six) hours as needed for muscle spasms. 20 tablet 1  . PARAGARD INTRAUTERINE COPPER IU 1 Device by Intrauterine route once.     . potassium chloride SA (KLOR-CON) 20 MEQ tablet Take 1 tablet (  20 mEq total) by mouth daily. (Patient not taking: No sig reported) 30 tablet 1  . prochlorperazine (COMPAZINE) 10 MG tablet Take 1 tablet (10 mg total) by mouth every 6 (six) hours as needed (Nausea or vomiting). (Patient not taking: No sig reported) 30 tablet 5  . senna-docusate (SENOKOT-S) 8.6-50 MG tablet Take 2 tablets by mouth 2 (two) times daily. (Patient not taking: No sig reported) 120 tablet 0  . traMADol (ULTRAM) 50 MG tablet  Take 1 tablet (50 mg total) by mouth every 12 (twelve) hours as needed for severe pain. 30 tablet 0   No current facility-administered medications for this visit.    PHYSICAL EXAMINATION: ECOG PERFORMANCE STATUS: 2 - Symptomatic, <50% confined to bed  Vitals:   01/17/21 0840  BP: 103/68  Pulse: 82  Resp: 16  Temp: 98.6 F (37 C)  SpO2: 99%   Filed Weights   01/17/21 0840  Weight: 146 lb 8 oz (66.5 kg)    GENERAL:alert, no distress and comfortable SKIN: skin color, texture, turgor are normal, no rashes or significant lesions EYES: normal, Conjunctiva are pink and non-injected, sclera clear  NECK: supple, thyroid normal size, non-tender, without nodularity LYMPH:  no palpable lymphadenopathy in the cervical, axillary LUNGS: clear to auscultation and percussion with normal breathing effort HEART: regular rate & rhythm and no murmurs and no lower extremity edema ABDOMEN:abdomen soft, non-tender and normal bowel sounds; incision healing well, some discharge present; nodule present at incision line, likely scar tissue, 0.5 cm Musculoskeletal:no cyanosis of digits and no clubbing  NEURO: alert & oriented x 3 with fluent speech, no focal motor/sensory deficits  LABORATORY DATA:  I have reviewed the data as listed CBC Latest Ref Rng & Units 01/17/2021 01/03/2021 01/02/2021  WBC 4.0 - 10.5 K/uL 7.5 4.7 4.7  Hemoglobin 12.0 - 15.0 g/dL 12.7 10.5(L) 10.7(L)  Hematocrit 36.0 - 46.0 % 39.8 33.6(L) 34.2(L)  Platelets 150 - 400 K/uL 176 225 225     CMP Latest Ref Rng & Units 01/17/2021 01/03/2021 01/02/2021  Glucose 70 - 99 mg/dL 94 93 107(H)  BUN 6 - 20 mg/dL _0 Creatinine 0.44 - 1.00 mg/dL 0.63 0.39(L) 0.51  Sodium 135 - 145 mmol/L 140 138 139  Potassium 3.5 - 5.1 mmol/L 4.3 3.9 3.8  Chloride 98 - 111 mmol/L 107 102 103  CO2 22 - 32 mmol/L _1 Calcium 8.9 - 10.3 mg/dL 8.6(L) 8.6(L) 8.5(L)  Total Protein 6.5 - 8.1 g/dL 6.8 - -  Total Bilirubin 0.3 - 1.2 mg/dL 0.2(L) - -   Alkaline Phos 38 - 126 U/L 87 - -  AST 15 - 41 U/L 21 - -  ALT 0 - 44 U/L 18 - -      RADIOGRAPHIC STUDIES: I have personally reviewed the radiological images as listed and agreed with the findings in the report. No results found.   ASSESSMENT & PLAN:  MERTHA CLYATT is a 40 y.o. 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 She was found to have perforation and positive radial margin.Liver biopsy on 04/16/2020 confirmed metastatic disease from her colon cancer -Work up isconsistent with stage IV colon cancer s/p surgical resection of the primary tumor with oligo liver metastasis, overall the disease burden appears to be low.  -Istarted her onfirst line chemo  withFOLFOXIRI q2 weeks for 55month beginning 05/23/20. Bevacizumab-bvzr (Zirabev)added with C2.She completed on 10/24/20.  -10/2020 CT Chest and MRI abdomen shows decreased oligo liver metastasis to 1.3cm, no evidence of other metastasis. -She underwent liver resection on 12/13/20 under Dr. BBarry Dienes Pathology showed: metastatic colon carcinoma to liver showing approximately 80% necrosis, which indicating good response to chemo, resection margin negative.  -I do not plan to give more adjuvant chemo  -I discussed that her risk of recurrence is still high, about 50%. I discussed cancer surveillance, including lab, physical exam, routine office visit and scan.  I also discussed the GuardantReveal test, which we will obtain at her next visit. Will plan to repeat CT scan every 4 months for next year. -She is scheduled to see Dr. BBarry Dieneson Monday, 01/21/21. -f/u with me in 6 weeks, or later if she sees Dr. BBarry Dienesaround the same time  2. Postoperative Care -I recommended she supplement with Ensure or Boost for more  comprehensive nutrition. -She is taking tramadol as needed for pain. -CT A/P on 12/30/20 showed small bilateral adnexal cysts and a stable uterine fibroid. She expressed concern with this, and I reviewed this with her today. I discussed that I am not concerned with these findings. -She notes she had a period while in the hospital, which had stopped while during chemotherapy.   3.  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 She notes she does drop small things.  -I recommended she take B12 supplements to combat this.  4.Anemia, secondary to #1 and iron deficiencyfrom GI Blood loss. Resolved s/p IVFeraheme in 6/2021and8/13/21. -anemia resolved now   5. Mild thrombocytopenia -Secondary to chemotherapy -improving   6. Genetics  -Due to her young age, I recommended genetic testing to ruled out cancer syndrome.  She initially wanted to wait due to her insurance issue -Genetics consult on 12/04/20, genetic test was drawn today    Plan: -I refilled her tramadol today -lab, flush and f/u in 6 weeks, including GuardantReveal  -Repeat scan every 4 months (next in 04/2021, will order at next visit)   No problem-specific Assessment & Plan notes found for this encounter.   No orders of the defined types were placed in this encounter.  All questions were answered. The patient knows to call the clinic with any problems, questions or concerns. No barriers to learning was detected. The total time spent in the appointment was 30 minutes.     YTruitt Merle MD 01/17/2021   I, KWilburn Mylar am acting as scribe for YTruitt Merle MD.   I have reviewed the above documentation for accuracy and completeness, and I agree with the above.

## 2021-01-17 ENCOUNTER — Telehealth: Payer: Self-pay | Admitting: Hematology

## 2021-01-17 ENCOUNTER — Inpatient Hospital Stay (HOSPITAL_BASED_OUTPATIENT_CLINIC_OR_DEPARTMENT_OTHER): Payer: Medicaid Other | Admitting: Hematology

## 2021-01-17 ENCOUNTER — Inpatient Hospital Stay: Payer: Medicaid Other

## 2021-01-17 ENCOUNTER — Inpatient Hospital Stay: Payer: Medicaid Other | Attending: Nurse Practitioner

## 2021-01-17 ENCOUNTER — Other Ambulatory Visit: Payer: Self-pay

## 2021-01-17 ENCOUNTER — Encounter: Payer: Self-pay | Admitting: Hematology

## 2021-01-17 VITALS — BP 103/68 | HR 82 | Temp 98.6°F | Resp 16 | Ht 69.0 in | Wt 146.5 lb

## 2021-01-17 DIAGNOSIS — C787 Secondary malignant neoplasm of liver and intrahepatic bile duct: Secondary | ICD-10-CM | POA: Insufficient documentation

## 2021-01-17 DIAGNOSIS — C19 Malignant neoplasm of rectosigmoid junction: Secondary | ICD-10-CM

## 2021-01-17 DIAGNOSIS — T451X5A Adverse effect of antineoplastic and immunosuppressive drugs, initial encounter: Secondary | ICD-10-CM | POA: Insufficient documentation

## 2021-01-17 DIAGNOSIS — D649 Anemia, unspecified: Secondary | ICD-10-CM | POA: Insufficient documentation

## 2021-01-17 DIAGNOSIS — G620 Drug-induced polyneuropathy: Secondary | ICD-10-CM | POA: Diagnosis not present

## 2021-01-17 DIAGNOSIS — Z79899 Other long term (current) drug therapy: Secondary | ICD-10-CM | POA: Diagnosis not present

## 2021-01-17 DIAGNOSIS — Z933 Colostomy status: Secondary | ICD-10-CM | POA: Diagnosis not present

## 2021-01-17 DIAGNOSIS — Z95828 Presence of other vascular implants and grafts: Secondary | ICD-10-CM

## 2021-01-17 DIAGNOSIS — Z85048 Personal history of other malignant neoplasm of rectum, rectosigmoid junction, and anus: Secondary | ICD-10-CM | POA: Diagnosis present

## 2021-01-17 LAB — CBC WITH DIFFERENTIAL (CANCER CENTER ONLY)
Abs Immature Granulocytes: 0.02 10*3/uL (ref 0.00–0.07)
Basophils Absolute: 0 10*3/uL (ref 0.0–0.1)
Basophils Relative: 0 %
Eosinophils Absolute: 0.2 10*3/uL (ref 0.0–0.5)
Eosinophils Relative: 2 %
HCT: 39.8 % (ref 36.0–46.0)
Hemoglobin: 12.7 g/dL (ref 12.0–15.0)
Immature Granulocytes: 0 %
Lymphocytes Relative: 15 %
Lymphs Abs: 1.1 10*3/uL (ref 0.7–4.0)
MCH: 29.3 pg (ref 26.0–34.0)
MCHC: 31.9 g/dL (ref 30.0–36.0)
MCV: 91.9 fL (ref 80.0–100.0)
Monocytes Absolute: 0.6 10*3/uL (ref 0.1–1.0)
Monocytes Relative: 8 %
Neutro Abs: 5.6 10*3/uL (ref 1.7–7.7)
Neutrophils Relative %: 75 %
Platelet Count: 176 10*3/uL (ref 150–400)
RBC: 4.33 MIL/uL (ref 3.87–5.11)
RDW: 14.6 % (ref 11.5–15.5)
WBC Count: 7.5 10*3/uL (ref 4.0–10.5)
nRBC: 0 % (ref 0.0–0.2)

## 2021-01-17 LAB — CMP (CANCER CENTER ONLY)
ALT: 18 U/L (ref 0–44)
AST: 21 U/L (ref 15–41)
Albumin: 3.4 g/dL — ABNORMAL LOW (ref 3.5–5.0)
Alkaline Phosphatase: 87 U/L (ref 38–126)
Anion gap: 10 (ref 5–15)
BUN: 14 mg/dL (ref 6–20)
CO2: 23 mmol/L (ref 22–32)
Calcium: 8.6 mg/dL — ABNORMAL LOW (ref 8.9–10.3)
Chloride: 107 mmol/L (ref 98–111)
Creatinine: 0.63 mg/dL (ref 0.44–1.00)
GFR, Estimated: >60 mL/min (ref >60)
Glucose, Bld: 94 mg/dL (ref 70–99)
Potassium: 4.3 mmol/L (ref 3.5–5.1)
Sodium: 140 mmol/L (ref 135–145)
Total Bilirubin: 0.2 mg/dL — ABNORMAL LOW (ref 0.3–1.2)
Total Protein: 6.8 g/dL (ref 6.5–8.1)

## 2021-01-17 LAB — CEA (IN HOUSE-CHCC): CEA (CHCC-In House): 2.03 ng/mL (ref 0.00–5.00)

## 2021-01-17 LAB — IRON AND TIBC
Iron: 41 ug/dL (ref 41–142)
Saturation Ratios: 14 % — ABNORMAL LOW (ref 21–57)
TIBC: 290 ug/dL (ref 236–444)
UIBC: 249 ug/dL (ref 120–384)

## 2021-01-17 LAB — GENETIC SCREENING ORDER

## 2021-01-17 LAB — FERRITIN: Ferritin: 72 ng/mL (ref 11–307)

## 2021-01-17 MED ORDER — SODIUM CHLORIDE 0.9% FLUSH
10.0000 mL | Freq: Once | INTRAVENOUS | Status: AC | PRN
  Administered 2021-01-17: 10 mL
  Filled 2021-01-17: qty 10

## 2021-01-17 MED ORDER — TRAMADOL HCL 50 MG PO TABS: 50.0000 mg | ORAL_TABLET | Freq: Two times a day (BID) | ORAL | 0 refills | Status: DC | PRN

## 2021-01-17 MED ORDER — HEPARIN SOD (PORK) LOCK FLUSH 100 UNIT/ML IV SOLN
500.0000 [IU] | Freq: Once | INTRAVENOUS | Status: AC | PRN
  Administered 2021-01-17: 500 [IU]
  Filled 2021-01-17: qty 5

## 2021-01-17 NOTE — Telephone Encounter (Signed)
Scheduled follow-up appointment per 4/21 los. Patient is aware.

## 2021-01-25 ENCOUNTER — Telehealth: Payer: Self-pay

## 2021-01-25 NOTE — Telephone Encounter (Signed)
Stephanie [name] left vm stating that she had her menses this past week and that the flow was heavier than usual.  She also stated she was having breast pain.  I reviewed with Stephanie [name] that per Dr Burr Medico.  Her menses can be irregular with heavier flow has her menses returns.  She recommends that she follow up with her PCP or OB/GYN for both her menses and breast pain.  Stephanie [name] verbalized understanding.  She ahs an appt with ob/gyn on 02/05/2021.

## 2021-01-28 ENCOUNTER — Telehealth: Payer: Self-pay | Admitting: Genetic Counselor

## 2021-01-28 ENCOUNTER — Ambulatory Visit: Payer: Self-pay | Admitting: Genetic Counselor

## 2021-01-28 ENCOUNTER — Encounter: Payer: Self-pay | Admitting: Genetic Counselor

## 2021-01-28 DIAGNOSIS — Z1379 Encounter for other screening for genetic and chromosomal anomalies: Secondary | ICD-10-CM | POA: Insufficient documentation

## 2021-01-28 NOTE — Progress Notes (Signed)
HPI:  Stephanie [name] was previously seen in the [facility] due to a personal and family history of cancer and concerns regarding a hereditary predisposition to cancer. Please refer to our prior cancer genetics clinic note for more information regarding our discussion, assessment and recommendations, at the time. Stephanie [name] recent genetic test results were disclosed to her, as were recommendations warranted by these results. These results and recommendations are discussed in more detail below.  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 Redemonstrated hypoenhancing lesion of the posterior liver dome, hepatic segment VII, reduced in size compared to prior examination, measuring 1.3 x 1.3 cm, previously 1.8 x 1.7 cm. Findings are consistent with treatment response of a biopsy proven metastasis. No other evidence of lymphadenopathy or metastatic disease within the abdomen or pelvis. 2. Unchanged mild splenomegaly, maximum coronal span 14.0 cm. 3. 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 Overall findings are highly concerning for colorectal carcinoma involving the sigmoid colon with an associated perforation and adjacent abscess and phlegmon formation as detailed above. Currently, no collection is amenable to percutaneous drainage given their small size and location. 2. New 2 cm mass in the right hepatic lobe concerning for metastatic disease to the liver until proven otherwise. 3.  Enlarged regional lymph nodes as detailed above is concerning for nodal metastatic disease. 4. Large stool burden. 5. Prominent pelvic veins which can be seen in patients with pelvic congestion syndrome.   03/13/2020 Imaging   ABD US IMPRESSION: Approximately 2.1 x 2.4 x 2.0 cm lobular homogeneously echogenic lesion in the right hepatic dome corresponds with the abnormality seen on the prior CT scan. Sonographically, this appearance is highly suggestive of a benign hemangioma.   Recommend MRI of the abdomen with gadolinium contrast which may provide a noninvasive diagnosis of benign hemangioma.    03/13/2020 Imaging   MR ABD W/WO CONTRAST Hepatobiliary: Diffuse low signal intensity throughout the hepatic parenchyma on T2 weighted images, presumably a consequence of recent Feraheme injection. In segment 7 of the liver (axial image 8 of series 5) there is a 2.5 x 1.9 cm well-defined lesion which is slightly T2 hyperintense. This lesion appears hyperintense on pre gadolinium T1 weighted images (likely a consequence of Feraheme). Interpretation of enhancement within the lesion is compromised by presence of Feraheme. No other hepatic lesions are confidently identified on today's examination. No intra or extrahepatic biliary ductal dilatation. Gallbladder is normal in appearance.   03/31/2020 Imaging   CT AP W contrast IMPRESSION: 1. Previously noted sigmoid colon mass appears increased in size, and again appears to be associated with a focal contained perforation which crosses the midline and has fistulized into the left adnexal region where there is now what appears to be a large left tubo-ovarian abscess, as detailed above. This is also associated with multiple enlarged lymph nodes in the pelvis measuring up to 1.2 cm in short axis and borderline enlarged retroperitoneal lymph nodes, concerning for metastatic disease. In addition, previously suspected metastatic lesion in segment 7 of  the liver has enlarged. 2. Small volume of ascites. 3. Additional incidental findings, as above.   04/04/2020 Cancer Staging   Staging form: Colon and Rectum, AJCC 8th Edition - Pathologic stage from 04/04/2020: pT4a, pN1b, cM1 - Signed by [name], NP on 05/07/2020   04/04/2020 Procedure   Paracentesis, path showed no malignant cells (mixed acute and chronic inflammation present)   04/04/2020 Surgery   Open sigmoid colectomy and end colostomy by Dr. [name]   04/04/2020 Pathology Results   FINAL MICROSCOPIC DIAGNOSIS: A. COLON, RECTOSIGMOID, RESECTION: - Invasive moderately differentiated adenocarcinoma, 6 cm, involving rectosigmoid junction - Carcinoma invades into serosal surface with perforation and associated serositis - Radial resection margin is positive for carcinoma; proximal and distal margins are not involved - Lymphovascular invasion is present - Metastatic carcinoma to one of fifteen lymph nodes (1/15); one tumor deposit - See oncology table B. LYMPH NODES, MESENTERIC, RESECTION: - Metastatic adenocarcinoma to one of six lymph nodes (1/6) - One tumor deposit  Addendum to note 2 involved lymph nodes (of 21 examined nodes) pT4a,pN1b MMR-normal, preserved expression of MLH1, MSH2, MSH6, PMS2   04/12/2020 Procedure   PAC placement    04/13/2020 Imaging   CT chest without contrast IMPRESSION: Interval development of bilateral pleural effusions, left slightly greater than right, with resultant bibasilar atelectasis including subtotal collapse of the left lower lobe. No evidence of intrathoracic metastatic disease, though evaluation of the collapsed parenchyma is limited. Hepatic metastasis again demonstrated.     04/16/2020 Pathology Results   FINAL MICROSCOPIC DIAGNOSIS:  A. LIVER, RIGHT LOBE, BIOPSY:  - Adenocarcinoma.  COMMENT:  The morphology is compatible with the provided clinical history of colorectal carcinoma.    05/23/2020 - 10/24/2020 Chemotherapy    FOLFIRINOX q2weeks for 3-6 months starting 05/23/20. Bevacizumab-bvzr [name] added with C2. Oxaliplatin held with C11-12 due to reaction. (pt developed SOB, chest palpitation and abdominal discomfort shortly after oxaliplatin started). Completed on 10/24/20.   08/05/2020 Imaging   CT AP  IMPRESSION: 1. Postsurgical changes of distal colectomy with a left lower quadrant end ostomy. No evidence of obstruction or acute complication at this time. Excluded rectal pouch in the deep pelvis without acute complication or worrisome features. 2. Slight interval decrease in size of a hypoattenuating lesion posterior right lobe liver measuring 1.7 x 1.8 x 2 cm. This lesion has previously undergone ultrasound-guided biopsy with pathologic results demonstrating adenocarcinoma compatible with metastatic disease from patient's resected colorectal carcinoma. 3. Slight prominence of the parametrial vessels bilaterally, nonspecific though can be seen in the setting of pelvic congestion syndrome. 4. Mild splenomegaly.  No focal lesion.     11/12/2020 Imaging   CT Chest  IMPRESSION: 1. No evidence of metastatic disease in the chest. 2. 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   12/13/2020 Surgery   A. 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 The report date is 01/28/2021.   The CustomNext-Cancer+RNAinsight panel offered by [facility] included sequencing and rearrangement analysis for the following 47 genes:  APC, ATM, AXIN2, BARD1, BMPR1A, BRCA1, BRCA2, BRIP1,  CDH1, CDK4, CDKN2A, CHEK2, DICER1, EPCAM,  GREM1, HOXB13, MEN1, MLH1, MSH2, MSH3, MSH6, MUTYH, NBN, NF1, NF2, NTHL1, PALB2, PMS2, POLD1, POLE, PTEN, RAD51C, RAD51D, RECQL, RET, SDHA, SDHAF2, SDHB, SDHC, SDHD, SMAD4, SMARCA4, STK11, TP53, TSC1, TSC2, and VHL.  RNA data is routinely analyzed for use in variant interpretation for all genes.     FAMILY HISTORY:  We obtained a detailed, 4-generation family history.  Significant diagnoses are listed below: Family History  Problem Relation Age of Onset  . Heart disease Father   . Stroke Father   . Aneurysm Father   . Bladder Cancer Maternal Grandmother        dx 90s  . Colon cancer Neg Hx   . Esophageal cancer Neg Hx    Stephanie [name] has one daughter (age 16). She has one full-brother (age 56), one full-sister (age 17), one paternal half-brother (age 46), and one paternal half-sister (age 14). None of these relatives have had cancer.  Stephanie [name] mother is alive at age 75 without cancer. There are three maternal uncles. There is no known cancer among maternal aunts/uncles or maternal cousins. Stephanie [name] maternal grandmother is in her 60s and has a history of bladder cancer diagnosed in her 76s. Her maternal grandfather died in his 59s without cancer.   Stephanie [name] father died at age 66 without cancer. There are two paternal aunts and one paternal uncle. There is no known cancer among paternal aunts/uncles or paternal cousins. Stephanie [name] paternal grandmother died in her 66s without cancer. She does not have information about her paternal grandfather.  Stephanie [name] is unaware of previous family history of genetic testing for hereditary cancer risks. Patient's ancestors are of [place] and [place] descent. There is no reported Ashkenazi Jewish ancestry. There is no known consanguinity.  GENETIC TEST RESULTS: Genetic testing reported out on 01/28/2021 through the [facility] CustomNext-Cancer + RNAinsight panel. No pathogenic variants were  detected.   The CustomNext-Cancer+RNAinsight panel offered by [facility] included sequencing and rearrangement analysis for the following 47 genes:  APC, ATM, AXIN2, BARD1, BMPR1A, BRCA1, BRCA2, BRIP1, CDH1, CDK4, CDKN2A, CHEK2, DICER1, EPCAM, GREM1, HOXB13, MEN1, MLH1, MSH2, MSH3, MSH6, MUTYH, NBN, NF1, NF2, NTHL1, PALB2, PMS2, POLD1, POLE, PTEN, RAD51C, RAD51D, RECQL, RET, SDHA, SDHAF2, SDHB, SDHC, SDHD, SMAD4, SMARCA4, STK11, TP53, TSC1, TSC2, and VHL.  RNA data is routinely analyzed for use in variant interpretation for all genes. The test report will be scanned into EPIC and located under the Molecular Pathology section of the Results Review tab.  A portion of the [name] report is included below for reference.     We discussed with Stephanie [name] that because current genetic testing is not perfect, it is possible there may be a gene mutation in one of these genes that current testing cannot detect, but that chance is small.  We also discussed that there could be another gene that has not yet been discovered, or that we have not yet tested, that is responsible for the cancer diagnoses in the family. It is also possible there is a hereditary cause for the cancer in the family that Stephanie [name] did not inherit and therefore was not identified in her testing.  Therefore, it is important to remain in touch with cancer genetics in the future so that we can continue to offer Stephanie [name] the most up to date genetic testing.   CANCER SCREENING RECOMMENDATIONS: Stephanie [name] is considered negative (normal). This means that we have not identified a hereditary cause for her personal history of cancer at  this time. While reassuring, this does not definitively rule out a hereditary predisposition to cancer. It is still possible that there could be genetic mutations that are undetectable by current technology. There could be genetic mutations in genes that have not been tested or identified to  increase cancer risk.  Therefore, it is recommended she continue to follow the cancer management and screening guidelines provided by her oncology and primary healthcare provider.   An individual's cancer risk and medical management are not determined by genetic test results alone. Overall cancer risk assessment incorporates additional factors, including personal medical history, family history, and any available genetic information that may [name] in a personalized plan for cancer prevention and surveillance.  Based on Stephanie [name]'s personal and family history, as well as her genetic test results, a statistical model Midwife) was used to estimate her risk of developing breast cancer. Tyrer-Cuzick estimates her lifetime risk of developing breast cancer to be approximately 8.9%. This lifetime breast cancer risk is a preliminary estimate based on available information using one of several models endorsed by the [place] (ACS). The ACS recommends consideration of breast MRI screening as an adjunct to mammography for patients at high risk (defined as 20% or greater lifetime risk). A more detailed breast cancer risk assessment can be considered, if clinically indicated. This risk estimate can change over time and this calculation may be repeated to reflect new information in her personal or family history in the future.     RECOMMENDATIONS FOR FAMILY MEMBERS:  Individuals in this family might be at some increased risk of developing cancer, over the general population risk, simply due to the family history of cancer.  We recommended women in this family have a yearly mammogram beginning at age 66, or 88 years younger than the earliest onset of cancer, an annual clinical breast exam, and perform monthly breast self-exams. Women in this family should also have a gynecological exam as recommended by their primary provider.   Per the NCCN Guidelines (Colorectal Cancer Screening Guidelines,  Version 1.2022), all of Stephanie [name]'s first-degree relatives should have a colonoscopy every 5 years, or as determined by their GI doctors, beginning 10 years younger than the earliest diagnosis.  FOLLOW-UP: Lastly, we discussed with Stephanie [name] that cancer genetics is a rapidly advancing field and it is possible that new genetic tests will be appropriate for her and/or her family members in the future. We encouraged her to remain in contact with cancer genetics on an annual basis so we can update her personal and family histories and let her know of advances in cancer genetics that may benefit this family.   Our contact number was provided. Stephanie [name] questions were answered to her satisfaction, and she knows she is welcome to call us at anytime with additional questions or concerns.   Clint Guy, MS, [facility] Genetic Counselor [email or web address] Phone: [phone]

## 2021-01-28 NOTE — Telephone Encounter (Signed)
Revealed negative genetic testing. Discussed that we do not know why she has colorectal cancer or why there is cancer in the family. It is possible that there could be a mutation in a different gene that we are not testing, or our current technology may not be able to detect certain mutations. It will therefore be important for her to stay in contact with genetics to keep up with whether additional testing may be appropriate in the future.

## 2021-02-01 ENCOUNTER — Other Ambulatory Visit: Payer: Self-pay | Admitting: Hematology

## 2021-02-01 ENCOUNTER — Other Ambulatory Visit: Payer: Self-pay | Admitting: Nurse Practitioner

## 2021-02-01 MED ORDER — LORAZEPAM 0.5 MG PO TABS: 0.5000 mg | ORAL_TABLET | Freq: Two times a day (BID) | ORAL | 0 refills | Status: DC | PRN

## 2021-02-04 MED ORDER — TRAMADOL HCL 50 MG PO TABS: 50.0000 mg | ORAL_TABLET | Freq: Two times a day (BID) | ORAL | 0 refills | Status: DC | PRN

## 2021-02-05 ENCOUNTER — Ambulatory Visit (INDEPENDENT_AMBULATORY_CARE_PROVIDER_SITE_OTHER): Payer: Medicaid Other | Admitting: Obstetrics & Gynecology

## 2021-02-05 ENCOUNTER — Other Ambulatory Visit: Payer: Self-pay

## 2021-02-05 ENCOUNTER — Other Ambulatory Visit (HOSPITAL_COMMUNITY)
Admission: RE | Admit: 2021-02-05 | Discharge: 2021-02-05 | Disposition: A | Discharge status: Home / Self Care | Payer: Medicaid Other | Source: Ambulatory Visit | Attending: Obstetrics & Gynecology | Admitting: Obstetrics & Gynecology

## 2021-02-05 VITALS — BP 114/81 | HR 79 | Ht 69.0 in | Wt 150.0 lb

## 2021-02-05 DIAGNOSIS — D259 Leiomyoma of uterus, unspecified: Secondary | ICD-10-CM

## 2021-02-05 DIAGNOSIS — Z0000 Encounter for general adult medical examination without abnormal findings: Secondary | ICD-10-CM | POA: Diagnosis not present

## 2021-02-05 DIAGNOSIS — Z01419 Encounter for gynecological examination (general) (routine) without abnormal findings: Secondary | ICD-10-CM | POA: Diagnosis not present

## 2021-02-05 DIAGNOSIS — N390 Urinary tract infection, site not specified: Secondary | ICD-10-CM | POA: Diagnosis not present

## 2021-02-05 DIAGNOSIS — Z975 Presence of (intrauterine) contraceptive device: Secondary | ICD-10-CM

## 2021-02-05 DIAGNOSIS — Z1231 Encounter for screening mammogram for malignant neoplasm of breast: Secondary | ICD-10-CM

## 2021-02-05 DIAGNOSIS — N898 Other specified noninflammatory disorders of vagina: Secondary | ICD-10-CM

## 2021-02-05 DIAGNOSIS — Z124 Encounter for screening for malignant neoplasm of cervix: Secondary | ICD-10-CM

## 2021-02-05 DIAGNOSIS — Z87440 Personal history of urinary (tract) infections: Secondary | ICD-10-CM

## 2021-02-05 LAB — POCT URINALYSIS DIPSTICK
Bilirubin, UA: NEGATIVE
Glucose, UA: NEGATIVE
Ketones, UA: NEGATIVE
Nitrite, UA: NEGATIVE
Protein, UA: NEGATIVE
Spec Grav, UA: 1.01 (ref 1.010–1.025)
Urobilinogen, UA: 0.2 E.U./dL
pH, UA: 6 (ref 5.0–8.0)

## 2021-02-05 NOTE — Patient Instructions (Signed)
Intrauterine Device Information An intrauterine device (IUD) is a medical device that is inserted into the uterus to prevent pregnancy. It is a small, T-shaped device that has one or two nylon strings hanging down from it. The strings hang out of the lower part of the uterus (cervix) to allow for future IUD removal. There are two types of IUDs:  Hormone IUD. This type of IUD is made of plastic and contains the hormone progestin (synthetic progesterone). A hormone IUD may last 3-5 years.  Copper IUD. This type of IUD has copper wire wrapped around it. A copper IUD may last up to 10 years. How is an IUD inserted? An IUD is inserted through the vagina, through the cervix, and into the uterus with a minor medical procedure. The procedure for IUD insertion may vary among health care providers and hospitals. How does an IUD work? Synthetic progesterone in a hormonal IUD prevents pregnancy by:  Thickening cervical mucus to prevent sperm from entering the uterus.  Thinning the uterine lining to prevent a fertilized egg from being implanted there. Copper in a copper IUD prevents pregnancy by making the uterus and fallopian tubes produce a fluid that kills sperm. What are the advantages of an IUD? Advantages of either type of IUD An IUD:  Is highly effective in preventing pregnancy.  Is reversible. You can become pregnant shortly after the IUD is removed.  Is low-maintenance and can stay in place for a long time.  Has no estrogen-related side effects.  Can be used when breastfeeding.  Is not associated with weight gain.  Can be inserted right after childbirth, an abortion, or a miscarriage. Advantages of a hormone IUD  If it is inserted within 7 days of your period starting, it works right after it has been inserted. If the hormone IUD is inserted at any other time in your cycle, you will need to use a backup method of birth control for 7 days after insertion.  It can make menstrual periods  lighter or stop completely.  It can reduce menstrual cramping and other discomforts from menstrual periods.  It can be used for 3-5 years, depending on which IUD you have. Advantages of a copper IUD  It works right after it is inserted.  It can be used as a form of emergency birth control if it is inserted within 5 days after having unprotected sex.  It does not interfere with your body's natural hormones.  It can be used for up to 10 years. What are the disadvantages of an IUD?  An IUD may cause irregular menstrual bleeding for a period of time after insertion.  It is common to have pain during insertion and have cramping and vaginal bleeding after insertion.  An IUD may cut the uterus (uterine perforation) when it is inserted. This is rare.  Pelvic inflammatory disease (PID) may happen after insertion of an IUD. PID is an infection in the uterus and fallopian tubes. The IUD does not cause the infection. The infection is usually from an unknown sexually transmitted infection (STI). This is rare, and it usually happens during the first 20 days after the IUD is inserted.  A copper IUD can make your menstrual flow heavier and more painful.  IUDs cannot prevent sexually transmitted infections (STIs). How is an IUD removed?   You will lie on your back with your knees bent and your feet in footrests (stirrups).  A device will be inserted into your vagina to spread apart the vaginal walls (  speculum). This will allow your health care provider to see the strings attached to the IUD.  Your health care provider will use a small instrument (forceps) to grasp the IUD strings and will pull firmly until the IUD is removed. You may have some discomfort when the IUD is removed. Your health care provider may recommend taking over-the-counter pain relievers, such as ibuprofen, before the procedure. You may also have minor spotting for a few days after the procedure. The procedure for IUD removal may  vary among health care providers and hospitals. Is an IUD right for me? If you are interested in an IUD, discuss it with your health care provider. He or she will make sure you are a good candidate for an IUD and will let you know more about the advantages, disadvantage, and possible side effects. This will allow you to make a decision about the device. Summary  An intrauterine device (IUD) is a medical device that is inserted in the uterus to prevent pregnancy. It is a small, T-shaped device that has one or two nylon strings hanging down from it.  A hormone IUD contains the hormone progestin (synthetic progesterone). A copper IUD has copper wire wrapped around it.  Synthetic progesterone in a hormone IUD prevents pregnancy by thickening cervical mucus and thinning the walls of the uterus. Copper in a copper IUD prevents pregnancy by making the uterus and fallopian tubes produce a fluid that kills sperm.  A hormone IUD can be left in place for 3-5 years. A copper IUD can be left in place for up to 10 years.  An IUD is inserted and removed by a health care provider. You may feel some pain during insertion and removal. Your health care provider may recommend taking over-the-counter pain medicine, such as ibuprofen, before an IUD procedure. This information is not intended to replace advice given to you by your health care provider. Make sure you discuss any questions you have with your health care provider. Document Revised: 03/28/2020 Document Reviewed: 03/28/2020 Elsevier Patient Education  2021 Elsevier Inc.  

## 2021-02-05 NOTE — Progress Notes (Signed)
New GYN patient. Had CT done on 12/19/20 and 12/30/20  *Pt has colorectal cancer.recently went through Chemo.  Pt states on last scan she had ovarian cyst While on chemo had no cycles now that cycles have started back cycle is very heavy, painful. Pt wants to also discuss Hysterectomy.  LMP:01/18/21 Last pap: per pt maybe more than 3 yrs .  Mammogram: Not yet.   Pt would like to have urine dipped notes frequent UTI and BV infections.  Wants swab.

## 2021-02-05 NOTE — Progress Notes (Signed)
Patient ID: Stephanie [name], female   DOB: 03-23-1981, 40 y.o.   MRN: [id number]  Chief Complaint  Patient presents with  . Ovarian Cyst    HPI Stephanie [name] is a 40 y.o. female.  G3P1011, No LMP recorded. She was diagnosed with colon cancer last year and she had bowel resection, chemotherapy and partial hepatectomy. Her menses where absent for several months during treatment, and when they returned recently she had heavy flow and pain which was not normal. IUP, Paragard in place for 5 years.  HPI  Past Medical History:  Diagnosis Date  . BV (bacterial vaginosis)   . Cancer ([place])   . Family history of bladder cancer   . Lactose intolerance 03/12/2020  . UTI (lower urinary tract infection)     Past Surgical History:  Procedure Laterality Date  . CESAREAN SECTION    . CYSTOSCOPY WITH STENT PLACEMENT  04/04/2020   Procedure: CYSTOSCOPY WITH STENT PLACEMENT;  Surgeon: [name], MD;  Location: [facility];  Service: General;;  . LAPAROSCOPIC LIVER ULTRASOUND N/A 12/13/2020   Procedure: INTRAOPERATIVE LIVER ULTRASOUND;  Surgeon: [name], MD;  Location: [facility];  Service: General;  Laterality: N/A;  . LAPAROSCOPY N/A 12/13/2020   Procedure: LAPAROSCOPY DIAGNOSTIC;  Surgeon: [name], MD;  Location: [facility];  Service: General;  Laterality: N/A;  . LAPAROTOMY N/A 04/04/2020   Procedure: EXPLORATORY LAPAROTOMY;  Surgeon: [name], MD;  [facility];  Service: General;  Laterality: N/A;  . OPEN PARTIAL HEPATECTOMY  N/A 12/13/2020   Procedure: OPEN PARTIAL HEPATECTOMY;  Surgeon: [name], MD;  Location: [facility];  Service: General;  Laterality: N/A;  ROOM 2 STARTING AT 09:30AM FOR 300 MIN  . PORTACATH PLACEMENT Right 04/12/2020   Procedure: INSERTION PORT-A-CATH WITH ULTRASOUND;  Surgeon: Kinsinger, Arta Bruce, MD;  Location[facility];  Service: General;  Laterality: Right;    Family History  Problem Relation Age of Onset  . Heart disease Father   . Stroke Father   .  Aneurysm Father   . Bladder Cancer Maternal Grandmother        dx 90s  . Colon cancer Neg Hx   . Esophageal cancer Neg Hx     Social History Social History   Tobacco Use  . Smoking status: Former Smoker    Packs/day: 0.50    Years: 10.00    Pack years: 5.00    Types: Cigarettes  . Smokeless tobacco: Never Used  Vaping Use  . Vaping Use: Never used  Substance Use Topics  . Alcohol use: Yes    Comment: seldom  . Drug use: No    Allergies  Allergen Reactions  . Oxaliplatin Shortness Of Breath    Current Outpatient Medications  Medication Sig Dispense Refill  . acetaminophen (TYLENOL) 325 MG tablet Take 650 mg by mouth daily as needed for fever or headache (pain).    [name] acetaminophen (TYLENOL) 325 MG tablet Take 2 tablets (650 mg total) by mouth every 6 (six) hours as needed for mild pain (or Fever >/= 101).    [name] ibuprofen (ADVIL) 200 MG tablet Take 800 mg by mouth every 6 (six) hours as needed for fever, headache or mild pain.    [name] LORazepam (ATIVAN) 0.5 MG tablet Take 1 tablet (0.5 mg total) by mouth 2 (two) times daily as needed for anxiety. 30 tablet 0  . PARAGARD INTRAUTERINE COPPER IU 1 Device by Intrauterine route once.     . traMADol (ULTRAM) 50 MG tablet  Take 1 tablet (50 mg total) by mouth every 12 (twelve) hours as needed for severe pain. 30 tablet 0  . methocarbamol (ROBAXIN) 500 MG tablet Take 1 tablet (500 mg total) by mouth every 6 (six) hours as needed for muscle spasms. (Patient not taking: Reported on 02/05/2021) 20 tablet 1  . potassium chloride SA (KLOR-CON) 20 MEQ tablet Take 1 tablet (20 mEq total) by mouth daily. (Patient not taking: No sig reported) 30 tablet 1  . prochlorperazine (COMPAZINE) 10 MG tablet Take 1 tablet (10 mg total) by mouth every 6 (six) hours as needed (Nausea or vomiting). (Patient not taking: No sig reported) 30 tablet 5  . senna-docusate (SENOKOT-S) 8.6-50 MG tablet Take 2 tablets by mouth 2 (two) times daily. (Patient not taking: No  sig reported) 120 tablet 0   No current facility-administered medications for this visit.    Review of Systems Review of Systems  Genitourinary: Positive for frequency, menstrual problem, pelvic pain and vaginal discharge.    Blood pressure 114/81, pulse 79, height 5\' 9"  (1.753 m), weight 150 lb (68 kg).  Physical Exam Physical Exam Nursing note reviewed. Exam conducted with a chaperone present.  Constitutional:      Appearance: Normal appearance.  HENT:     Head: Normocephalic and atraumatic.  Eyes:     Conjunctiva/sclera: Conjunctivae normal.  Cardiovascular:     Rate and Rhythm: Normal rate.     Heart sounds: Normal heart sounds.  Pulmonary:     Breath sounds: Normal breath sounds.  Chest:  Breasts:     Right: Normal.     Left: Normal.    Abdominal:     General: Abdomen is flat.     Palpations: Abdomen is soft.     Comments: Ostomy left side  Genitourinary:    General: Normal vulva.     Exam position: Lithotomy position.     Vagina: Normal.     Cervix: Normal.     Uterus: Normal.      Adnexa: Right adnexa normal and left adnexa normal.     Comments: Paragard string Musculoskeletal:     Cervical back: Normal range of motion.  Skin:    General: Skin is warm and dry.  Neurological:     Mental Status: She is alert.  Psychiatric:        Mood and Affect: Mood normal.        Behavior: Behavior normal.     Data Reviewed Narrative & Impression  CLINICAL DATA:  Fever and abdominal pain. History of liver resection 2 weeks ago.  EXAM: CT ABDOMEN AND PELVIS WITH CONTRAST  TECHNIQUE: Multidetector CT imaging of the abdomen and pelvis was performed using the standard protocol following bolus administration of intravenous contrast.  CONTRAST:  14mL OMNIPAQUE IOHEXOL 300 MG/ML  SOLN  COMPARISON:  December 19, 2020  FINDINGS: Lower chest: Mild atelectasis is seen within the right lung base.  There is a small, stable right pleural  effusion.  Hepatobiliary: Postoperative changes are again seen along the posterior margin of the liver parenchyma consistent with recent partial resection of the right lobe. An associated 7.7 cm x 2.7 cm posterior subcapsular gas and fluid collection is noted, with multiple adjacent surgical clips. This is very mildly decreased in size when compared to the prior study. A 2.5 cm x 2.3 cm well-defined cystic appearing area is also seen within this region, inferiorly (axial CT image 28, CT series number 2). This represents a new finding when compared to the  prior study.  A 9.6 cm x 2.0 cm subcapsular collection of gas and fluid is also seen along the anterior aspect of the right lobe of the liver. This is decreased in size when compared to the prior exam. An adjacent surgical drain is seen within the anteromedial supra hepatic space.  Pancreas: Unremarkable. No pancreatic ductal dilatation or surrounding inflammatory changes.  Spleen: Normal in size without focal abnormality.  Adrenals/Urinary Tract: Adrenal glands are unremarkable. Kidneys are normal, without renal calculi, focal lesion, or hydronephrosis. Bladder is unremarkable.  Stomach/Bowel: Stomach is within normal limits. Appendix appears normal. A left lower quadrant ostomy site is seen. No evidence of bowel wall thickening, distention, or inflammatory changes.  Vascular/Lymphatic: No significant vascular findings are present. No enlarged abdominal or pelvic lymph nodes.  Reproductive: An IUD is seen within the uterus. A stable 1.4 cm diameter uterine fibroid is seen. Small bilateral adnexal cysts are noted.  Other: No pelvic free fluid is seen.  Musculoskeletal: Degenerative changes are seen within the lower lumbar spine, at the level of L4-L5.  IMPRESSION: 1. Postoperative changes consistent with recent partial resection of the right lobe of the liver with anterior and posterior subcapsular collections  of gas and fluid. 2. Additional postoperative changes consistent with prior distal colectomy. 3. Small, stable right pleural effusion. 4. Small bilateral adnexal cysts, likely ovarian in origin.   Electronically Signed   By: Virgina Norfolk M.D.   On: 12/30/2020 22:43      Assessment Screening for cervical cancer - Plan: Cytology - PAP( [place])  Vaginal discharge - Plan: Cervicovaginal ancillary only( [place])  Frequent UTI - Plan: POCT Urinalysis Dipstick, Urine Culture  Screening mammogram for breast cancer - Plan: MM Digital Screening  IUD (intrauterine device) in place - Plan: US PELVIC COMPLETE WITH TRANSVAGINAL  Uterine leiomyoma, unspecified location - Plan: US PELVIC COMPLETE WITH TRANSVAGINAL    Plan Consider LNGIUD, removal of Paragard, use NSAID during menses, wait to see if nl menses return. She is not sexually active now Mammogram Pelvic US ordered    [name] 02/05/2021, 1:51 PM

## 2021-02-06 LAB — CERVICOVAGINAL ANCILLARY ONLY
Bacterial Vaginitis (gardnerella): NEGATIVE
Candida Glabrata: POSITIVE — AB
Candida Vaginitis: NEGATIVE
Comment: NEGATIVE
Comment: NEGATIVE
Comment: NEGATIVE

## 2021-02-07 LAB — CYTOLOGY - PAP
Comment: NEGATIVE
Diagnosis: NEGATIVE
High risk HPV: NEGATIVE

## 2021-02-08 LAB — URINE CULTURE

## 2021-02-11 ENCOUNTER — Ambulatory Visit (HOSPITAL_COMMUNITY): Payer: Medicaid Other | Attending: Obstetrics & Gynecology | Admitting: Radiology

## 2021-02-12 ENCOUNTER — Ambulatory Visit (HOSPITAL_COMMUNITY)
Admission: RE | Admit: 2021-02-12 | Discharge: 2021-02-12 | Disposition: A | Discharge status: Home / Self Care | Payer: Medicaid Other | Source: Ambulatory Visit | Attending: Obstetrics & Gynecology | Admitting: Obstetrics & Gynecology

## 2021-02-12 ENCOUNTER — Other Ambulatory Visit: Payer: Self-pay

## 2021-02-12 DIAGNOSIS — D259 Leiomyoma of uterus, unspecified: Secondary | ICD-10-CM

## 2021-02-12 DIAGNOSIS — Z975 Presence of (intrauterine) contraceptive device: Secondary | ICD-10-CM

## 2021-02-19 ENCOUNTER — Emergency Department (HOSPITAL_COMMUNITY)
Admission: EM | Admit: 2021-02-19 | Discharge: 2021-02-19 | Disposition: A | Discharge status: Home / Self Care | Payer: Medicaid Other | Attending: Emergency Medicine | Admitting: Emergency Medicine

## 2021-02-19 ENCOUNTER — Encounter (HOSPITAL_COMMUNITY): Payer: Self-pay

## 2021-02-19 ENCOUNTER — Other Ambulatory Visit: Payer: Self-pay

## 2021-02-19 ENCOUNTER — Emergency Department (HOSPITAL_COMMUNITY): Payer: Medicaid Other

## 2021-02-19 ENCOUNTER — Other Ambulatory Visit: Payer: Self-pay | Admitting: Hematology

## 2021-02-19 DIAGNOSIS — R109 Unspecified abdominal pain: Secondary | ICD-10-CM

## 2021-02-19 DIAGNOSIS — R1033 Periumbilical pain: Secondary | ICD-10-CM | POA: Insufficient documentation

## 2021-02-19 DIAGNOSIS — Z87891 Personal history of nicotine dependence: Secondary | ICD-10-CM | POA: Insufficient documentation

## 2021-02-19 DIAGNOSIS — Z85038 Personal history of other malignant neoplasm of large intestine: Secondary | ICD-10-CM | POA: Insufficient documentation

## 2021-02-19 DIAGNOSIS — Z85048 Personal history of other malignant neoplasm of rectum, rectosigmoid junction, and anus: Secondary | ICD-10-CM | POA: Insufficient documentation

## 2021-02-19 LAB — URINALYSIS, ROUTINE W REFLEX MICROSCOPIC
Bilirubin Urine: NEGATIVE
Glucose, UA: NEGATIVE mg/dL
Ketones, ur: NEGATIVE mg/dL
Nitrite: NEGATIVE
Protein, ur: NEGATIVE mg/dL
Specific Gravity, Urine: 1.002 — ABNORMAL LOW (ref 1.005–1.030)
pH: 7 (ref 5.0–8.0)

## 2021-02-19 LAB — CBC WITH DIFFERENTIAL/PLATELET
Abs Immature Granulocytes: 0.01 10*3/uL (ref 0.00–0.07)
Basophils Absolute: 0 10*3/uL (ref 0.0–0.1)
Basophils Relative: 0 %
Eosinophils Absolute: 0.1 10*3/uL (ref 0.0–0.5)
Eosinophils Relative: 1 %
HCT: 40.4 % (ref 36.0–46.0)
Hemoglobin: 12.9 g/dL (ref 12.0–15.0)
Immature Granulocytes: 0 %
Lymphocytes Relative: 26 %
Lymphs Abs: 1.3 10*3/uL (ref 0.7–4.0)
MCH: 28.9 pg (ref 26.0–34.0)
MCHC: 31.9 g/dL (ref 30.0–36.0)
MCV: 90.4 fL (ref 80.0–100.0)
Monocytes Absolute: 0.5 10*3/uL (ref 0.1–1.0)
Monocytes Relative: 10 %
Neutro Abs: 3.1 10*3/uL (ref 1.7–7.7)
Neutrophils Relative %: 63 %
Platelets: 109 10*3/uL — ABNORMAL LOW (ref 150–400)
RBC: 4.47 MIL/uL (ref 3.87–5.11)
RDW: 14.9 % (ref 11.5–15.5)
WBC: 5 10*3/uL (ref 4.0–10.5)
nRBC: 0 % (ref 0.0–0.2)

## 2021-02-19 LAB — COMPREHENSIVE METABOLIC PANEL
ALT: 29 U/L (ref 0–44)
AST: 28 U/L (ref 15–41)
Albumin: 3.7 g/dL (ref 3.5–5.0)
Alkaline Phosphatase: 73 U/L (ref 38–126)
Anion gap: 10 (ref 5–15)
BUN: 7 mg/dL (ref 6–20)
CO2: 23 mmol/L (ref 22–32)
Calcium: 8.8 mg/dL — ABNORMAL LOW (ref 8.9–10.3)
Chloride: 104 mmol/L (ref 98–111)
Creatinine, Ser: 0.45 mg/dL (ref 0.44–1.00)
GFR, Estimated: >60 mL/min (ref >60)
Glucose, Bld: 81 mg/dL (ref 70–99)
Potassium: 3.7 mmol/L (ref 3.5–5.1)
Sodium: 137 mmol/L (ref 135–145)
Total Bilirubin: 0.3 mg/dL (ref 0.3–1.2)
Total Protein: 6.8 g/dL (ref 6.5–8.1)

## 2021-02-19 LAB — I-STAT BETA HCG BLOOD, ED (MC, WL, AP ONLY): I-stat hCG, quantitative: 5.2 m[IU]/mL — ABNORMAL HIGH (ref <5)

## 2021-02-19 IMAGING — CT CT ABD-PELV W/O CM
2 of 4 series · 14 of 46 positions shown, 16 images · non-contrast
Comparison: [DATE]

CLINICAL DATA: Status post partial liver resection now with
persistent abdominal pain and abdominal distension.

EXAM:
CT ABDOMEN AND PELVIS WITHOUT CONTRAST
TECHNIQUE: Multidetector CT imaging of the abdomen and pelvis was performed
following the standard protocol without IV contrast.

[Series 2: axial st · axial · 0.83mm/px · z∈[+895,+1330]mm · 11 of 99 slices shown, 13 images]
[im 6/99  soft-tissue]
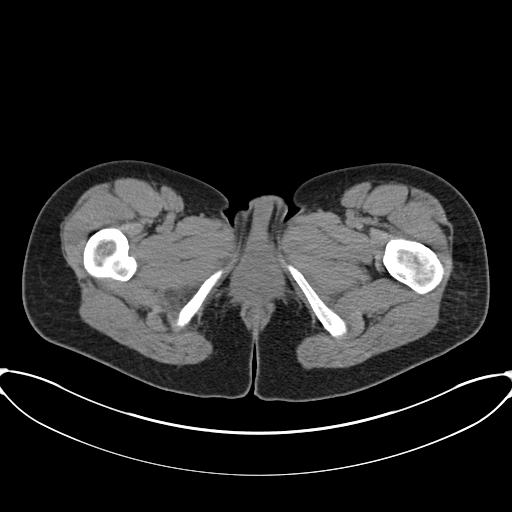
[im 6/99  bone]
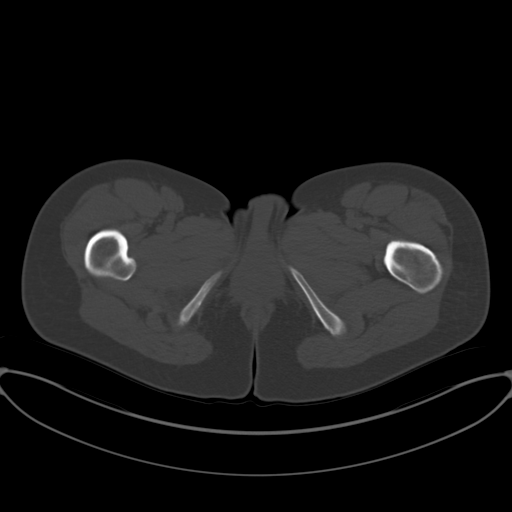
[im 17/99  soft-tissue]
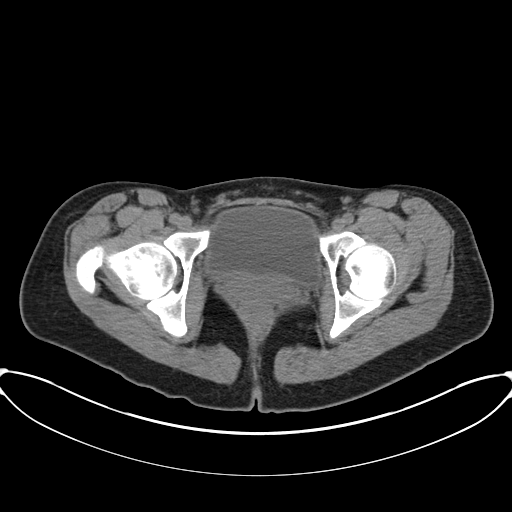
[im 22/99  soft-tissue]
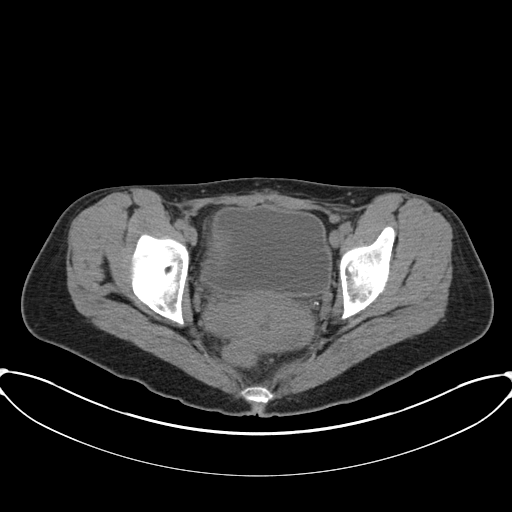
[im 33/99  soft-tissue]
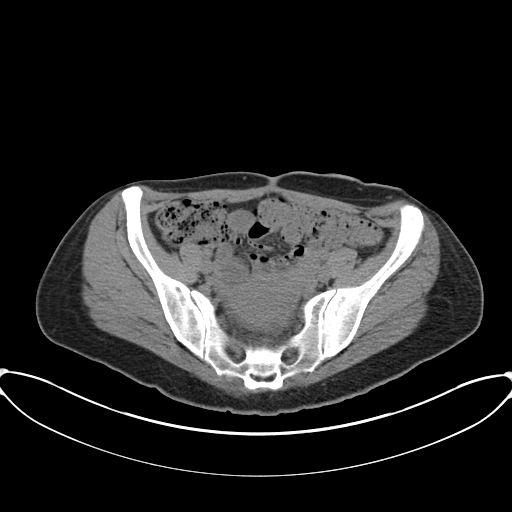
[im 39/99  soft-tissue]
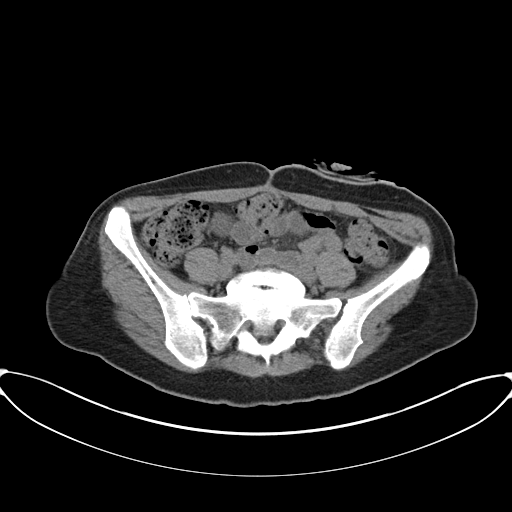
[im 50/99  soft-tissue]
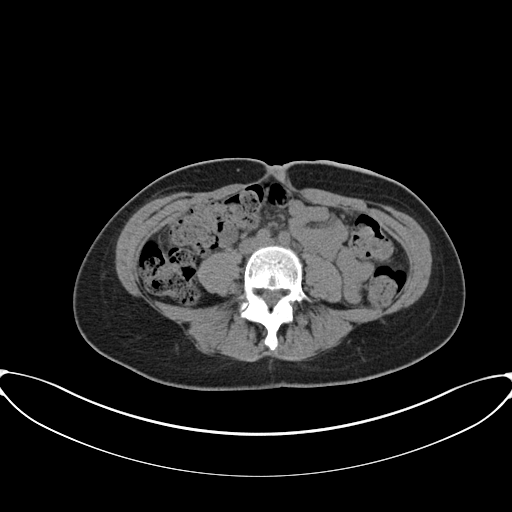
[im 60/99  soft-tissue]
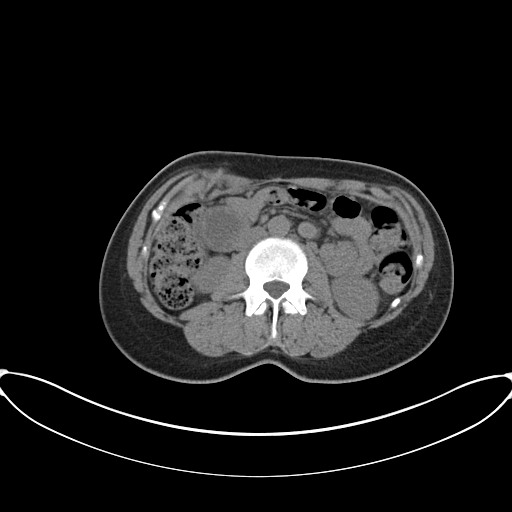
[im 66/99  soft-tissue]
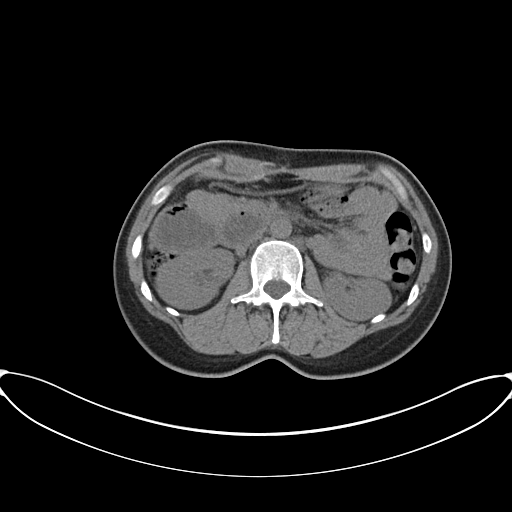
[im 77/99  soft-tissue]
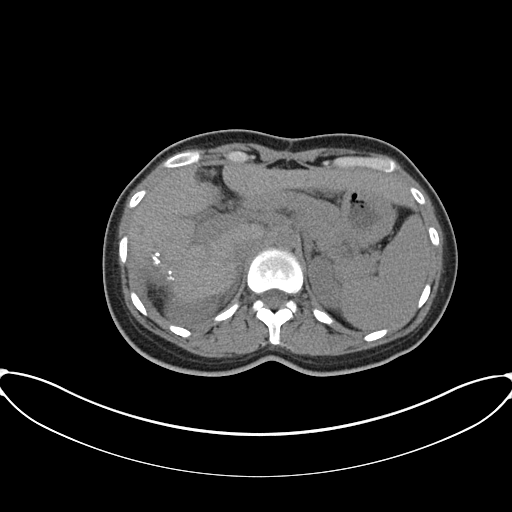
[im 77/99  bone]
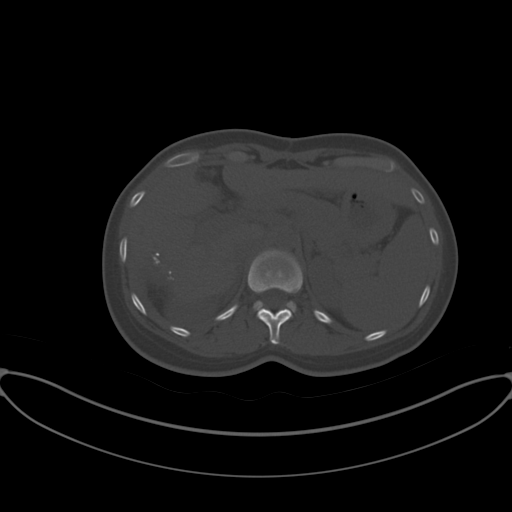
[im 82/99  soft-tissue]
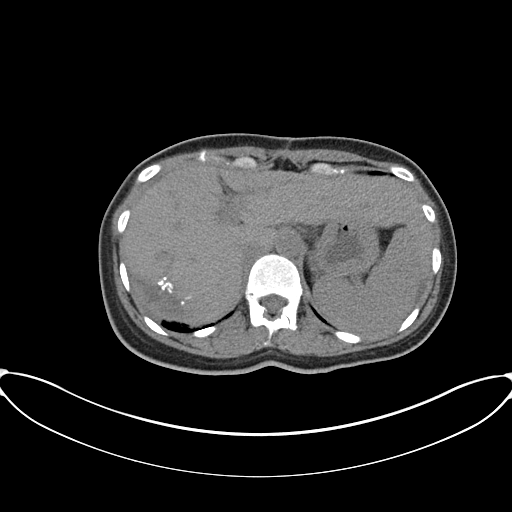
[im 93/99  soft-tissue]
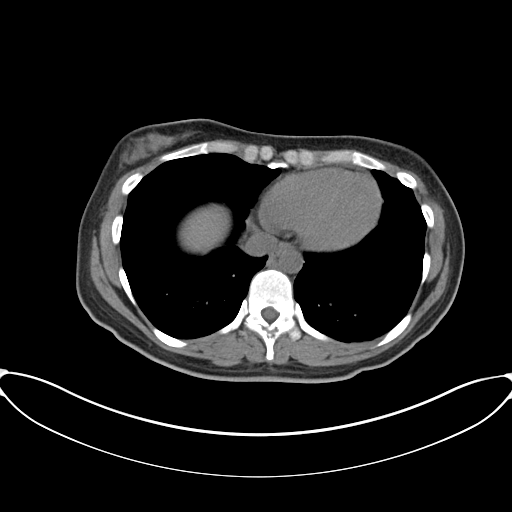

[Series 4: coronal st · coronal · 0.64mm/px · 3 of 123 slices shown]
[im 41/123  soft-tissue]
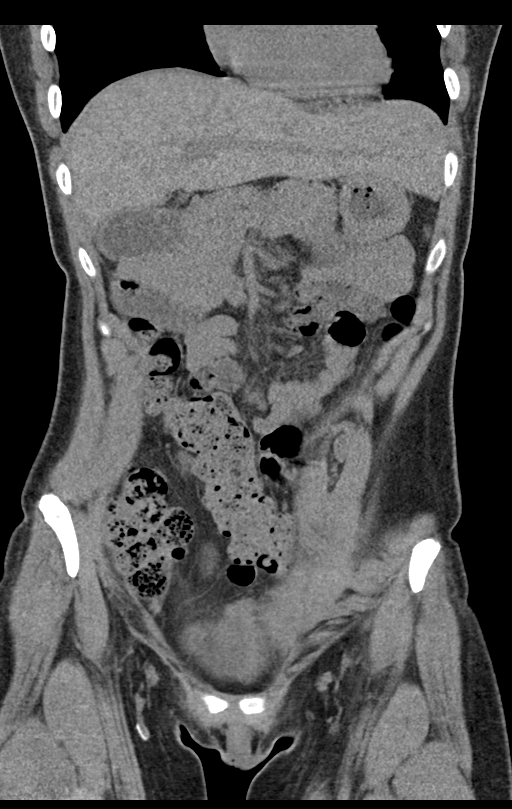
[im 55/123  soft-tissue]
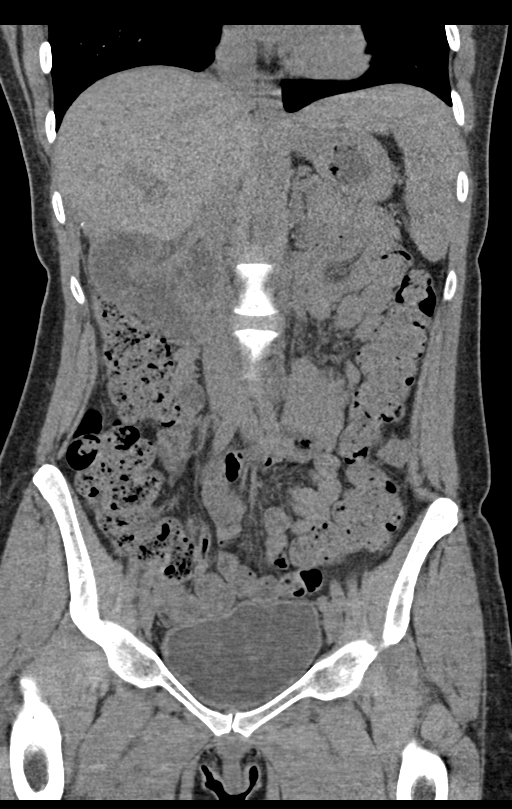
[im 68/123  soft-tissue]
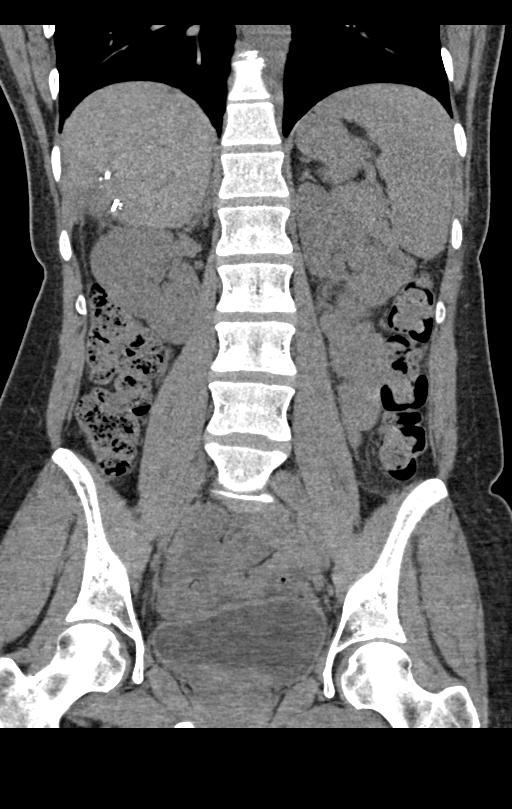

[14 of 46 positions shown; findings below may reference images not displayed]

FINDINGS: Lower chest: No acute abnormality.

Hepatobiliary: Surgical changes of partial right hepatic resection
are again identified. While not optimally assessed in absence of
contrast administration, the previously noted perihepatic fluid
collection adjacent to the surgical margin has improved, measuring
roughly 2.1 x 5.4 cm on axial image # [DATE]. Previously noted
subdiaphragmatic gas and fluid beneath the right hemidiaphragm has
resolved. Surgical drainage catheter within this region has been
removed. The liver is otherwise unremarkable. Cholecystectomy has
been performed.

Pancreas: Unremarkable

Spleen: Unremarkable

Adrenals/Urinary Tract: Adrenal glands are unremarkable. Kidneys are
normal, without renal calculi, focal lesion, or hydronephrosis.
Bladder is unremarkable.

Stomach/Bowel: There is fluid distension of the a first and second
portion of the duodenum to the level of the SMA hiatus which appears
narrow measuring 6 mm in dimension. This is similar to that seen on
multiple prior examinations and the stomach, however, is
decompressed, arguing against a obstruction secondary to extrinsic
compression (SMA syndrome). Surgical changes of a descending
colostomy and ABRAHAM formation are identified. There is
moderate stool throughout the colon. No free intraperitoneal gas or
fluid. The small bowel is unremarkable. The appendix is normal.

Vascular/Lymphatic: No significant vascular findings are present. No
enlarged abdominal or pelvic lymph nodes.

Reproductive: Intrauterine device in expected position within the
uterine cavity. The pelvic organs are otherwise unremarkable.

Other: Small broad-based umbilical hernia contains a single wall of
the transverse colon.

Musculoskeletal: No acute bone abnormality. No lytic or blastic bone
lesion.
IMPRESSION: Slightly limited examination examination in absence of contrast
administration. Status post partial right hepatectomy. Interval
decrease in size in perihepatic fluid collection and resolution of
right subdiaphragmatic fluid and gas. No new intra-abdominal fluid
collections are identified.

Surgical changes of descending colostomy and ABRAHAM
formation. Moderate stool throughout the colon without evidence of
obstruction.

Fluid distension of the proximal duodenum to the level of the SMA
hiatus which appears narrow. The stomach, however, is decompressed
and this is similar to appearance on multiple prior examinations,
arguing against obstruction secondary to SMA syndrome.

## 2021-02-19 MED ORDER — MORPHINE SULFATE (PF) 4 MG/ML IV SOLN
4.0000 mg | Freq: Once | INTRAVENOUS | Status: AC
  Administered 2021-02-19: 4 mg via INTRAVENOUS
  Filled 2021-02-19: qty 1

## 2021-02-19 NOTE — ED Provider Notes (Signed)
[facility] DEPT Provider Note   CSN: [id number] Arrival date & time: 02/19/21  1912     History Chief Complaint  Patient presents with  . Abdominal Pain    Stephanie [name] is a 40 y.o. female.  40 year old female presents with abdominal discomfort x1 week.  Abdominal pain is characterized as a fullness is periumbilical.  No associated fever, vomiting, diarrhea.  No vaginal bleeding or discharge.  Patient does have a history of colon cancer as well as bowel obstruction.  Patient had a partial liver resection done 8 weeks ago.  Has an ostomy bag but denies any evidence of obstruction such as decreased output or pain.  Symptoms are somewhat positional.  No treatment use prior to arrival        Past Medical History:  Diagnosis Date  . BV (bacterial vaginosis)   . Cancer ([place])   . Family history of bladder cancer   . Lactose intolerance 03/12/2020  . UTI (lower urinary tract infection)     Patient Active Problem List   Diagnosis Date Noted  . Genetic testing 01/28/2021  . Abdominal fluid collection 12/31/2020  . Metastatic colon cancer to liver ([place]) 12/13/2020  . Family history of bladder cancer   . Port-A-Cath in place 05/03/2020  . Sepsis ([place]) 04/04/2020  . SOB (shortness of breath) 04/04/2020  . Moderate protein-calorie malnutrition ([place]) 04/04/2020  . Nausea & vomiting 04/04/2020  . TOA (tubo-ovarian abscess)   . Intra-abdominal abscess ([place]) 03/31/2020  . Lactose intolerance 03/12/2020  . Microcytic anemia 03/12/2020  . 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 Peritonitis with abscess of intestine ([place]) 03/12/2020  . Recurrent urinary tract infection 06/01/2012  . Contraception, device intrauterine 03/23/2012  . 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 Attention deficit disorder 02/27/2010  . Anxiety state 02/27/2010    Past Surgical History:  Procedure Laterality Date  . CESAREAN SECTION    . CYSTOSCOPY  WITH STENT PLACEMENT  04/04/2020   Procedure: CYSTOSCOPY WITH STENT PLACEMENT;  Surgeon: [name], MD;  [facility];  Service: General;;  . LAPAROSCOPIC LIVER ULTRASOUND N/A 12/13/2020   Procedure: INTRAOPERATIVE LIVER ULTRASOUND;  Surgeon: [name], MD;  Location: [facility];  Service: General;  Laterality: N/A;  . LAPAROSCOPY N/A 12/13/2020   Procedure: LAPAROSCOPY DIAGNOSTIC;  Surgeon: [name], MD;  Location: [facility];  Service: General;  Laterality: N/A;  . LAPAROTOMY N/A 04/04/2020   Procedure: EXPLORATORY LAPAROTOMY;  Surgeon: [name], MD;  [facility];  Service: General;  Laterality: N/A;  . OPEN PARTIAL HEPATECTOMY  N/A 12/13/2020   Procedure: OPEN PARTIAL HEPATECTOMY;  Surgeon: [name], MD;  Location: [facility];  Service: General;  Laterality: N/A;  ROOM 2 STARTING AT 09:30AM FOR 300 MIN  . PORTACATH PLACEMENT Right 04/12/2020   Procedure: INSERTION PORT-A-CATH WITH ULTRASOUND;  Surgeon: Kinsinger, Arta Bruce, MD;  [facility];  Service: General;  Laterality: Right;     OB History    Gravida  3   Para  1   Term  1   Preterm      AB  1   Living  1     SAB      IAB  1   Ectopic      Multiple      Live Births              Family History  Problem Relation Age of Onset  . Heart disease Father   .  Stroke Father   . Aneurysm Father   . Bladder Cancer Maternal Grandmother        dx 90s  . Colon cancer Neg Hx   . Esophageal cancer Neg Hx     Social History   Tobacco Use  . Smoking status: Former Smoker    Packs/day: 0.50    Years: 10.00    Pack years: 5.00    Types: Cigarettes  . Smokeless tobacco: Never Used  Vaping Use  . Vaping Use: Never used  Substance Use Topics  . Alcohol use: Yes    Comment: seldom  . Drug use: No    Home Medications Prior to Admission medications   Medication Sig Start Date End Date Taking? Authorizing Provider  acetaminophen (TYLENOL) 325 MG tablet Take 650 mg by mouth daily as needed for fever  or headache (pain).    [provider]  acetaminophen (TYLENOL) 325 MG tablet Take 2 tablets (650 mg total) by mouth every 6 (six) hours as needed for mild pain (or Fever >/= 101). 12/21/20   [name], MD  ibuprofen (ADVIL) 200 MG tablet Take 800 mg by mouth every 6 (six) hours as needed for fever, headache or mild pain.    [provider]  LORazepam (ATIVAN) 0.5 MG tablet Take 1 tablet (0.5 mg total) by mouth 2 (two) times daily as needed for anxiety. 02/01/21   [name], MD  methocarbamol (ROBAXIN) 500 MG tablet Take 1 tablet (500 mg total) by mouth every 6 (six) hours as needed for muscle spasms. Patient not taking: Reported on 02/05/2021 12/21/20   [name], MD  [facility] INTRAUTERINE COPPER IU 1 Device by Intrauterine route once.     [provider]  potassium chloride SA (KLOR-CON) 20 MEQ tablet Take 1 tablet (20 mEq total) by mouth daily. Patient not taking: No sig reported 06/08/20   [name], MD  prochlorperazine (COMPAZINE) 10 MG tablet Take 1 tablet (10 mg total) by mouth every 6 (six) hours as needed (Nausea or vomiting). Patient not taking: No sig reported 06/19/20   [name], MD  senna-docusate (SENOKOT-S) 8.6-50 MG tablet Take 2 tablets by mouth 2 (two) times daily. Patient not taking: No sig reported 03/16/20   Damita Lack, MD  traMADol (ULTRAM) 50 MG tablet Take 1 tablet (50 mg total) by mouth every 12 (twelve) hours as needed for severe pain. 02/04/21   [name], NP    Allergies    Oxaliplatin  Review of Systems   Review of Systems  All other systems reviewed and are negative.   Physical Exam Updated Vital Signs BP 120/90 (BP Location: Left Arm)   Pulse 71   Temp 97.8 F (36.6 C) (Oral)   Resp 18   Wt 69.3 kg   LMP 02/12/2021 (Exact Date)   SpO2 100%   BMI 22.56 kg/m   Physical Exam Vitals and nursing note reviewed.  Constitutional:      General: She is not in acute distress.    Appearance: Normal appearance. She is  well-developed. She is not toxic-appearing.  HENT:     Head: Normocephalic and atraumatic.  Eyes:     General: Lids are normal.     Conjunctiva/sclera: Conjunctivae normal.     Pupils: Pupils are equal, round, and reactive to light.  Neck:     Thyroid: No thyroid mass.     Trachea: No tracheal deviation.  Cardiovascular:     Rate and Rhythm: Normal rate and regular  rhythm.     Heart sounds: Normal heart sounds. No murmur heard. No gallop.   Pulmonary:     Effort: Pulmonary effort is normal. No respiratory distress.     Breath sounds: Normal breath sounds. No stridor. No decreased breath sounds, wheezing, rhonchi or rales.  Abdominal:     General: Bowel sounds are normal. There is no distension.     Palpations: Abdomen is soft.     Tenderness: There is no abdominal tenderness. There is no guarding or rebound.    Musculoskeletal:        General: No tenderness. Normal range of motion.     Cervical back: Normal range of motion and neck supple.  Skin:    General: Skin is warm and dry.     Findings: No abrasion or rash.  Neurological:     Mental Status: She is alert and oriented to person, place, and time.     GCS: GCS eye subscore is 4. GCS verbal subscore is 5. GCS motor subscore is 6.     Cranial Nerves: No cranial nerve deficit.     Sensory: No sensory deficit.  Psychiatric:        Speech: Speech normal.        Behavior: Behavior normal.     ED Results / Procedures / Treatments   Labs (all labs ordered are listed, but only abnormal results are displayed) Labs Reviewed  COMPREHENSIVE METABOLIC PANEL  CBC WITH DIFFERENTIAL/PLATELET  URINALYSIS, ROUTINE W REFLEX MICROSCOPIC  I-STAT BETA HCG BLOOD, ED ([facility], [facility], [facility] ONLY)    EKG None  Radiology No results found.  Procedures Procedures   Medications Ordered in ED Medications - No data to display  ED Course  I have reviewed the triage vital signs and the nursing notes.  Pertinent labs & imaging results that were  available during my care of the patient were reviewed by me and considered in my medical decision making (see chart for details).    MDM Rules/Calculators/A&P                         Labs are reassuring here. Patient has some abdominal discomfort and given morphine does feel better.  Abdominal CT without evidence of hernia or obstruction.  Does have moderate stool in her colon.  Patient to follow-up with her general surgeon Final Clinical Impression(s) / ED Diagnoses Final diagnoses:  None    Rx / DC Orders ED Discharge Orders    None       Lacretia Leigh, MD 02/19/21 2341

## 2021-02-19 NOTE — ED Notes (Signed)
Patient transported to CT 

## 2021-02-19 NOTE — ED Provider Notes (Signed)
Emergency Medicine Provider Triage Evaluation Note  Stephanie [name] , a 40 y.o. female  was evaluated in triage.  Pt complains of right lower quadrant pain for the past week.  Pain has been intermittent without specific aggravating alleviating factor.  States that she had a liver resection for tumor about 6 weeks ago and had a drain pulled from the area 2 weeks ago.  She also reports history of uterine fibroid and ovarian cyst.  Denies any fevers, vomiting, diarrhea, urinary symptoms or abnormal vaginal bleeding.  She does have a history of colon cancer.  Review of Systems  Positive: Abdominal pain Negative: Vomiting, vaginal bleeding, urinary symptoms  Physical Exam  BP 120/90 (BP Location: Left Arm)   Pulse 71   Temp 97.8 F (36.6 C) (Oral)   Resp 18   Wt 69.3 kg   LMP 02/12/2021 (Exact Date)   SpO2 100%   BMI 22.56 kg/m  Gen:   Awake, no distress   Resp:  Normal effort  MSK:   Moves extremities without difficulty  Other:  Healed surgical scars noted on abdomen with tenderness palpation of the right middle and right lower quadrant.  Medical Decision Making  Medically screening exam initiated at 7:47 PM.  Appropriate orders placed.  Garrett Bowring Klabunde was informed that the remainder of the evaluation will be completed by another provider, this initial triage assessment does not replace that evaluation, and the importance of remaining in the ED until their evaluation is complete.  Lab work ordered may need imaging due to history   Delia Heady, PA-C 02/19/21 1949    Lacretia Leigh, MD 02/22/21 [phone]

## 2021-02-19 NOTE — ED Triage Notes (Signed)
Pt c/o tenderness upon palpation of RLQ x 1 wk. Pt states she had a drain pulled from a liver resection 2 wks ago. Denies fevers, n/v/d.

## 2021-02-20 ENCOUNTER — Telehealth: Payer: Self-pay | Admitting: *Deleted

## 2021-02-20 ENCOUNTER — Encounter: Payer: Self-pay | Admitting: Hematology

## 2021-02-20 MED ORDER — LORAZEPAM 0.5 MG PO TABS
0.5000 mg | ORAL_TABLET | Freq: Two times a day (BID) | ORAL | 0 refills | Status: DC | PRN
Start: 2021-02-20 — End: 2021-03-06

## 2021-02-20 NOTE — Telephone Encounter (Signed)
Patient called with concerns about results from ED visit last night. Concerned about low platelet count, ovarian cyst, uterine fibroid, urinary frequency, and back pain. Reports extensive history. Reports normal LMP last week and no abnormal bleeding. I requested that Dr. Rip Harbour review patient's ED visit and advise. Dr.Ervin states that patient is stable from a GYN perspective and encourages the patient to follow up in the office on 01/28/21 as scheduled and to follow up with appropriate providers for non GYN concerns.

## 2021-02-26 ENCOUNTER — Other Ambulatory Visit: Payer: Self-pay

## 2021-02-26 ENCOUNTER — Ambulatory Visit (HOSPITAL_BASED_OUTPATIENT_CLINIC_OR_DEPARTMENT_OTHER)
Admission: RE | Admit: 2021-02-26 | Discharge: 2021-02-26 | Disposition: A | Discharge status: Home / Self Care | Payer: Medicaid Other | Source: Ambulatory Visit | Attending: Obstetrics & Gynecology | Admitting: Obstetrics & Gynecology

## 2021-02-26 DIAGNOSIS — Z1231 Encounter for screening mammogram for malignant neoplasm of breast: Secondary | ICD-10-CM | POA: Diagnosis present

## 2021-02-27 ENCOUNTER — Other Ambulatory Visit: Payer: Self-pay | Admitting: Hematology

## 2021-02-27 DIAGNOSIS — D696 Thrombocytopenia, unspecified: Secondary | ICD-10-CM

## 2021-02-27 NOTE — Progress Notes (Signed)
Stephanie [name]   Telephone:([phone]) [phone] Fax:([phone]) [phone]   Clinic Follow up Note   Patient Care Team: Patient, [name] (Inactive) as PCP - General (Home) [name] Merle, MD as Consulting Physician (Oncology) Stephanie Klein, MD as Consulting Physician (General Surgery)  Date of Service:  02/28/2021  CHIEF COMPLAINT: f/u of metastatic colon cancer  SUMMARY OF ONCOLOGIC HISTORY: Oncology History Overview Note  Cancer Staging Malignant neoplasm of rectosigmoid junction [facility]) Staging form: Colon and Rectum, AJCC 8th Edition - Pathologic stage from 04/04/2020: pT4a, pN1b, cM1 - Signed by Stephanie Feeling, NP on 05/07/2020    Malignant neoplasm of rectosigmoid junction ([place])  11/10/2019 Imaging   MRI Abdomen  IMPRESSION: 1. Redemonstrated hypoenhancing lesion of the posterior liver dome, hepatic segment VII, reduced in size compared to prior examination, measuring 1.3 x 1.3 cm, previously 1.8 x 1.7 cm. Findings are consistent with treatment response of a biopsy proven metastasis. No other evidence of lymphadenopathy or metastatic disease within the abdomen or pelvis. 2. Unchanged mild splenomegaly, maximum coronal span 14.0 cm. 3. 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   03/12/2020 Initial Diagnosis   Malignant neoplasm of rectosigmoid junction ([place])   03/12/2020 Imaging   CT AP with contrast IMPRESSION: 1. Overall findings are highly concerning for colorectal carcinoma involving the sigmoid colon with an associated perforation and adjacent abscess and phlegmon formation as detailed above. Currently, no collection is amenable to percutaneous drainage given their small size and location. 2. New 2 cm mass in the right hepatic lobe concerning for metastatic disease to the liver until proven otherwise. 3. Enlarged regional lymph nodes as detailed above is concerning for nodal metastatic disease. 4. Large stool burden. 5. Prominent  pelvic veins which can be seen in patients with pelvic congestion syndrome.   03/13/2020 Imaging   ABD US IMPRESSION: Approximately 2.1 x 2.4 x 2.0 cm lobular homogeneously echogenic lesion in the right hepatic dome corresponds with the abnormality seen on the prior CT scan. Sonographically, this appearance is highly suggestive of a benign hemangioma.   Recommend MRI of the abdomen with gadolinium contrast which may provide a noninvasive diagnosis of benign hemangioma.    03/13/2020 Imaging   MR ABD W/WO CONTRAST Hepatobiliary: Diffuse low signal intensity throughout the hepatic parenchyma on T2 weighted images, presumably a consequence of recent Feraheme injection. In segment 7 of the liver (axial image 8 of series 5) there is a 2.5 x 1.9 cm well-defined lesion which is slightly T2 hyperintense. This lesion appears hyperintense on pre gadolinium T1 weighted images (likely a consequence of Feraheme). Interpretation of enhancement within the lesion is compromised by presence of Feraheme. No other hepatic lesions are confidently identified on today's examination. No intra or extrahepatic biliary ductal dilatation. Gallbladder is normal in appearance.   03/31/2020 Imaging   CT AP W contrast IMPRESSION: 1. Previously noted sigmoid colon mass appears increased in size, and again appears to be associated with a focal contained perforation which crosses the midline and has fistulized into the left adnexal region where there is now what appears to be a large left tubo-ovarian abscess, as detailed above. This is also associated with multiple enlarged lymph nodes in the pelvis measuring up to 1.2 cm in short axis and borderline enlarged retroperitoneal lymph nodes, concerning for metastatic disease. In addition, previously suspected metastatic lesion in segment 7 of the liver has enlarged. 2. Small volume of ascites. 3. Additional incidental findings, as above.   04/04/2020  Cancer Staging    Staging form: Colon and Rectum, AJCC 8th Edition - Pathologic stage from 04/04/2020: pT4a, pN1b, cM1 - Signed by Stephanie Feeling, NP on 05/07/2020   04/04/2020 Procedure   Paracentesis, path showed no malignant cells (mixed acute and chronic inflammation present)   04/04/2020 Surgery   Open sigmoid colectomy and end colostomy by Dr. Stark [name]   04/04/2020 Pathology Results   FINAL MICROSCOPIC DIAGNOSIS: A. COLON, RECTOSIGMOID, RESECTION: - Invasive moderately differentiated adenocarcinoma, 6 cm, involving rectosigmoid junction - Carcinoma invades into serosal surface with perforation and associated serositis - Radial resection margin is positive for carcinoma; proximal and distal margins are not involved - Lymphovascular invasion is present - Metastatic carcinoma to one of fifteen lymph nodes (1/15); one tumor deposit - See oncology table B. LYMPH NODES, MESENTERIC, RESECTION: - Metastatic adenocarcinoma to one of six lymph nodes (1/6) - One tumor deposit  Addendum to note 2 involved lymph nodes (of 21 examined nodes) pT4a,pN1b MMR-normal, preserved expression of MLH1, MSH2, MSH6, PMS2   04/12/2020 Procedure   PAC placement    04/13/2020 Imaging   CT chest without contrast IMPRESSION: Interval development of bilateral pleural effusions, left slightly greater than right, with resultant bibasilar atelectasis including subtotal collapse of the left lower lobe. No evidence of intrathoracic metastatic disease, though evaluation of the collapsed parenchyma is limited. Hepatic metastasis again demonstrated.     04/16/2020 Pathology Results   FINAL MICROSCOPIC DIAGNOSIS:  A. LIVER, RIGHT LOBE, BIOPSY:  - Adenocarcinoma.  COMMENT:  The morphology is compatible with the provided clinical history of colorectal carcinoma.    05/23/2020 - 10/24/2020 Chemotherapy   FOLFIRINOX q2weeks for 3-6 months starting 05/23/20. Bevacizumab-bvzr [name] added with C2. Oxaliplatin held with C11-12 due to  reaction. (pt developed SOB, chest palpitation and abdominal discomfort shortly after oxaliplatin started). Completed on 10/24/20.   08/05/2020 Imaging   CT AP  IMPRESSION: 1. Postsurgical changes of distal colectomy with a left lower quadrant end ostomy. No evidence of obstruction or acute complication at this time. Excluded rectal pouch in the deep pelvis without acute complication or worrisome features. 2. Slight interval decrease in size of a hypoattenuating lesion posterior right lobe liver measuring 1.7 x 1.8 x 2 cm. This lesion has previously undergone ultrasound-guided biopsy with pathologic results demonstrating adenocarcinoma compatible with metastatic disease from patient's resected colorectal carcinoma. 3. Slight prominence of the parametrial vessels bilaterally, nonspecific though can be seen in the setting of pelvic congestion syndrome. 4. Mild splenomegaly.  No focal lesion.     11/12/2020 Imaging   CT Chest  IMPRESSION: 1. No evidence of metastatic disease in the chest. 2. 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   12/13/2020 Surgery   A. 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 The report date is 01/28/2021.   The CustomNext-Cancer+RNAinsight panel offered by [facility] included sequencing and rearrangement analysis for the following 47 genes:  APC, ATM, AXIN2, BARD1, BMPR1A, BRCA1, BRCA2, BRIP1, CDH1, CDK4, CDKN2A, CHEK2, DICER1, EPCAM, GREM1, HOXB13, MEN1, MLH1, MSH2, MSH3, MSH6, MUTYH, NBN, NF1, NF2, NTHL1, PALB2, PMS2,  POLD1, POLE, PTEN,  RAD51C, RAD51D, RECQL, RET, SDHA, SDHAF2, SDHB, SDHC, SDHD, SMAD4, SMARCA4, STK11, TP53, TSC1, TSC2, and VHL.  RNA data is routinely analyzed for use in variant interpretation for all genes.   02/19/2021 Imaging   CT A/P w/o contrast  IMPRESSION: Slightly limited examination examination in absence of contrast administration. Status post partial right hepatectomy. Interval decrease in size in perihepatic fluid collection and resolution of right subdiaphragmatic fluid and gas. No new intra-abdominal fluid collections are identified.   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 Moderate stool throughout the colon without evidence of obstruction.   Fluid distension of the proximal duodenum to the level of the SMA hiatus which appears narrow. 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      CURRENT THERAPY:  Surveillance  INTERVAL HISTORY:  SONJI STARKES is here for a follow up of metastatic colon cancer. She was last seen by me on 01/17/21. She presents to the clinic alone.  She reports some pain to her left abdomen. She notes she had an instance of vomiting following eating too much. She also notes she went on a roller coaster with her daughter while on vacation. Since then, she has had the pain in her abdomen. She presented to the ED on 02/19/21 for this. She notes she had pelvic ultrasound on 02/12/21, which was done transvaginally. She reports some bleeding following the scan. She wonders if the left ovarian cyst seen on the scan would cause her pain. She reports continued numbness in her fingers, which has left her unable to return to work at her [employer]. She notes this stays stable as long as she keeps moving.  All other systems were reviewed with the patient and are negative.  MEDICAL HISTORY:  Past Medical  History:  Diagnosis Date  . BV (bacterial vaginosis)   . Cancer ([place])   . Family history of bladder cancer   . Lactose intolerance 03/12/2020  . UTI (lower urinary tract infection)     SURGICAL HISTORY: Past Surgical History:  Procedure Laterality Date  . CESAREAN SECTION    . CYSTOSCOPY WITH STENT PLACEMENT  04/04/2020   Procedure: CYSTOSCOPY WITH STENT PLACEMENT;  Surgeon: Stephanie Klein, MD;  Location: [facility];  Service: General;;  . LAPAROSCOPIC LIVER ULTRASOUND N/A 12/13/2020   Procedure: INTRAOPERATIVE LIVER ULTRASOUND;  Surgeon: Stephanie Klein, MD;  Location: [place];  Service: General;  Laterality: N/A;  . LAPAROSCOPY N/A 12/13/2020   Procedure: LAPAROSCOPY DIAGNOSTIC;  Surgeon: Stephanie Klein, MD;  Location: [place];  Service: General;  Laterality: N/A;  . LAPAROTOMY N/A 04/04/2020   Procedure: EXPLORATORY LAPAROTOMY;  Surgeon: Stephanie Klein, MD;  Location: [facility];  Service: General;  Laterality: N/A;  . OPEN PARTIAL HEPATECTOMY  N/A 12/13/2020   Procedure: OPEN PARTIAL HEPATECTOMY;  Surgeon: Stephanie Klein, MD;  Location: [place];  Service: General;  Laterality: N/A;  ROOM 2 STARTING AT 09:30AM FOR 300 MIN  . PORTACATH PLACEMENT Right 04/12/2020   Procedure: INSERTION PORT-A-CATH WITH ULTRASOUND;  Surgeon: Kinsinger, Arta Bruce, MD;  Location: [facility];  Service: General;  Laterality: Right;    I have reviewed the social history and family history with the patient and they are unchanged from previous note.  ALLERGIES:  is allergic to oxaliplatin.  MEDICATIONS:  Current Outpatient Medications  Medication Sig Dispense Refill  . acetaminophen (TYLENOL) 325 MG tablet Take 650 mg by mouth daily as needed  for fever or headache (pain).    [name] acetaminophen (TYLENOL) 325 MG tablet Take 2 tablets (650 mg total) by mouth every 6 (six) hours as needed for mild pain (or Fever >/= 101).    [name] ibuprofen (ADVIL) 200 MG tablet Take 800 mg by mouth every 6 (six) hours as needed for fever, headache or mild pain.     [name] LORazepam (ATIVAN) 0.5 MG tablet Take 1 tablet (0.5 mg total) by mouth 2 (two) times daily as needed for anxiety. 30 tablet 0  . methocarbamol (ROBAXIN) 500 MG tablet Take 1 tablet (500 mg total) by mouth every 6 (six) hours as needed for muscle spasms. (Patient not taking: Reported on 02/05/2021) 20 tablet 1  . PARAGARD INTRAUTERINE COPPER IU 1 Device by Intrauterine route once.     . potassium chloride SA (KLOR-CON) 20 MEQ tablet Take 1 tablet (20 mEq total) by mouth daily. (Patient not taking: No sig reported) 30 tablet 1  . prochlorperazine (COMPAZINE) 10 MG tablet Take 1 tablet (10 mg total) by mouth every 6 (six) hours as needed (Nausea or vomiting). (Patient not taking: No sig reported) 30 tablet 5  . senna-docusate (SENOKOT-S) 8.6-50 MG tablet Take 2 tablets by mouth 2 (two) times daily. (Patient not taking: No sig reported) 120 tablet 0  . traMADol (ULTRAM) 50 MG tablet Take 1 tablet (50 mg total) by mouth every 12 (twelve) hours as needed for severe pain. 30 tablet 0   No current facility-administered medications for this visit.    PHYSICAL EXAMINATION: ECOG PERFORMANCE STATUS: 1 - Symptomatic but completely ambulatory  Vitals:   02/28/21 0816  BP: 117/72  Pulse: 72  Resp: 16  Temp: 97.8 F (36.6 C)  SpO2: 100%   Filed Weights   02/28/21 0816  Weight: 151 lb 8 oz (68.7 kg)    GENERAL:alert, no distress and comfortable SKIN: skin color, texture, turgor are normal, no rashes or significant lesions EYES: normal, Conjunctiva are pink and non-injected, sclera clear  NECK: supple, thyroid normal size, non-tender, without nodularity LYMPH:  no palpable lymphadenopathy in the cervical, axillary  LUNGS: clear to auscultation and percussion with normal breathing effort HEART: regular rate & rhythm and no murmurs and no lower extremity edema ABDOMEN:abdomen soft, non-tender and normal bowel sounds; nodule of scar tissue notes on right side Musculoskeletal:no cyanosis of digits  and no clubbing  NEURO: alert & oriented x 3 with fluent speech, no focal motor/sensory deficits  LABORATORY DATA:  I have reviewed the data as listed CBC Latest Ref Rng & Units 02/28/2021 02/19/2021 01/17/2021  WBC 4.0 - 10.5 K/uL 3.4(L) 5.0 7.5  Hemoglobin 12.0 - 15.0 g/dL 12.8 12.9 12.7  Hematocrit 36.0 - 46.0 % 38.7 40.4 39.8  Platelets 150 - 400 K/uL 104(L) 109(L) 176     CMP Latest Ref Rng & Units 02/28/2021 02/19/2021 01/17/2021  Glucose 70 - 99 mg/dL 99 81 94  BUN 6 - 20 mg/dL $Remove'9 7 14  'EoOYZWw$ Creatinine 0.44 - 1.00 mg/dL 0.65 0.45 0.63  Sodium 135 - 145 mmol/L 139 137 140  Potassium 3.5 - 5.1 mmol/L 3.9 3.7 4.3  Chloride 98 - 111 mmol/L 107 104 107  CO2 22 - 32 mmol/L $RemoveB'23 23 23  'dMtjftDl$ Calcium 8.9 - 10.3 mg/dL 8.6(L) 8.8(L) 8.6(L)  Total Protein 6.5 - 8.1 g/dL 6.3(L) 6.8 6.8  Total Bilirubin 0.3 - 1.2 mg/dL 0.4 0.3 0.2(L)  Alkaline Phos 38 - 126 U/L 78 73 87  AST 15 - 41 U/L 21 28  21  ALT 0 - 44 U/L $Remo'23 29 18      'qLOYA$ RADIOGRAPHIC STUDIES: I have personally reviewed the radiological images as listed and agreed with the findings in the report. No results found.   ASSESSMENT & PLAN:  Stephanie [name] is a 40 y.o. 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 She was found to have perforation and positive radial margin.Liver biopsy on 04/16/2020 confirmed metastatic disease from her colon cancer -Work up isconsistent with stage IV colon cancer s/p surgical resection of the primary tumor with oligo liver metastasis, overall the disease burden appears to be low.  -Istarted her onfirst line chemo withFOLFOXIRI q2 weeks for 30months beginning 05/23/20. Bevacizumab-bvzr (Zirabev)added with C2.She completed on 10/24/20. -10/2020 CT Chest and MRI abdomen shows decreased  oligoliver metastasis to 1.3cm, noevidence of other metastasis. -She underwent liver resection on 12/13/20 under Dr. Barry Dienes. Pathology showed: metastatic colon carcinoma to liver showing approximately 80% necrosis, which indicating good response to chemo, resection margin negative.  -I do not plan to give more adjuvant chemo  -I discussed that her risk of recurrence is still high, about 50%. I discussed cancer surveillance, including lab, physical exam, routine office visit and scan.  I also discussed the GuardantReveal test, which we will obtain at her next visit. Will plan to repeat CT scan every 4 months for next year. -I reviewed her CT A/p w/o contrast performed on 02/19/21 at the ED, which showed no concerning findings for cancer recurrence, although small mets could be missed due to the lack of contrast due to contrast shortage -I also reviewed her pelvic ultrasound from 02/12/21 and screening mammogram from 02/26/21 with her. I reassured her that these are not concerning at this time. I informed her that she will be scheduled for diagnostic mammogram and ultrasound in the near future.  -I also discussed the Guardant 360 we obtained today, will call her with result. -Labs reviewed, WBC 3.4, platelet count 104 today (02/28/21) -Will repeat CT in late August/early September. Hopefully by that time, hopefully the contrast shortage will be over.  3.  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 She notes she does drop small things. She rates it has 5/10. -I discussed a clinical trial study going on right now. I will have our study nurse talk to her about this today.  4.Anemia, secondary to #1 and iron deficiencyfrom GI Blood loss. Resolved s/p IVFeraheme in 6/2021and8/13/21. -anemia resolved now   5. Mildthrombocytopenia -Secondary to chemotherapy -improving  6.Genetics  -Due to her young age, I recommended genetic testing to  ruled out cancer syndrome.She initially wanted to wait due to her insurance issue -Genetics consult on 12/04/20, genetic test was drawn 01/17/21 -Results were negative   Plan: -lab, flush in 6 weeks -f/u in 3 months -lab and Repeat CT C/A/P in late 04/2021, before her next OV  -screening for neuropathy trail    No problem-specific Assessment & Plan notes found for this encounter.   Orders Placed This Encounter  Procedures  . CT CHEST ABDOMEN PELVIS W CONTRAST    If CT IV contrast shortage still not resolved by then, will change to abdomen MRI w wo contrast and CT chest wo contrast    Standing Status:   Future    Standing  Expiration Date:   02/28/2022    Order Specific Question:   If indicated for the ordered procedure, I authorize the administration of contrast media per Radiology protocol    Answer:   Yes    Order Specific Question:   Is patient pregnant?    Answer:   No    Order Specific Question:   Preferred imaging location?    Answer:   [facility]    Order Specific Question:   Release to patient    Answer:   Immediate    Order Specific Question:   Is Oral Contrast requested for this exam?    Answer:   Yes, Per Radiology protocol    Order Specific Question:   Reason for Exam (SYMPTOM  OR DIAGNOSIS REQUIRED)    Answer:   rule out cancer recurrence   All questions were answered. The patient knows to call the clinic with any problems, questions or concerns. No barriers to learning was detected. The total time spent in the appointment was 30 minutes.     [name] Merle, MD 02/28/2021   I, Wilburn Mylar, am acting as scribe for [name] Merle, MD.   I have reviewed the above documentation for accuracy and completeness, and I agree with the above.

## 2021-02-28 ENCOUNTER — Encounter: Payer: Self-pay | Admitting: Hematology

## 2021-02-28 ENCOUNTER — Other Ambulatory Visit: Payer: Self-pay | Admitting: *Deleted

## 2021-02-28 ENCOUNTER — Inpatient Hospital Stay: Payer: Medicaid Other | Attending: Nurse Practitioner | Admitting: Hematology

## 2021-02-28 ENCOUNTER — Encounter: Payer: Self-pay | Admitting: Obstetrics & Gynecology

## 2021-02-28 ENCOUNTER — Inpatient Hospital Stay: Payer: Medicaid Other

## 2021-02-28 ENCOUNTER — Ambulatory Visit (INDEPENDENT_AMBULATORY_CARE_PROVIDER_SITE_OTHER): Payer: Medicaid Other | Admitting: Obstetrics & Gynecology

## 2021-02-28 ENCOUNTER — Inpatient Hospital Stay: Payer: Medicaid Other | Admitting: Emergency Medicine

## 2021-02-28 ENCOUNTER — Other Ambulatory Visit: Payer: Self-pay | Admitting: Obstetrics & Gynecology

## 2021-02-28 ENCOUNTER — Other Ambulatory Visit: Payer: Self-pay

## 2021-02-28 VITALS — BP 103/70 | HR 76 | Wt 152.0 lb

## 2021-02-28 VITALS — BP 117/72 | HR 72 | Temp 97.8°F | Resp 16 | Ht 69.0 in | Wt 151.5 lb

## 2021-02-28 DIAGNOSIS — Z9221 Personal history of antineoplastic chemotherapy: Secondary | ICD-10-CM | POA: Insufficient documentation

## 2021-02-28 DIAGNOSIS — C787 Secondary malignant neoplasm of liver and intrahepatic bile duct: Secondary | ICD-10-CM

## 2021-02-28 DIAGNOSIS — C19 Malignant neoplasm of rectosigmoid junction: Secondary | ICD-10-CM | POA: Diagnosis not present

## 2021-02-28 DIAGNOSIS — C189 Malignant neoplasm of colon, unspecified: Secondary | ICD-10-CM

## 2021-02-28 DIAGNOSIS — G620 Drug-induced polyneuropathy: Secondary | ICD-10-CM | POA: Diagnosis not present

## 2021-02-28 DIAGNOSIS — Z85048 Personal history of other malignant neoplasm of rectum, rectosigmoid junction, and anus: Secondary | ICD-10-CM | POA: Insufficient documentation

## 2021-02-28 DIAGNOSIS — Z975 Presence of (intrauterine) contraceptive device: Secondary | ICD-10-CM | POA: Diagnosis not present

## 2021-02-28 DIAGNOSIS — D6959 Other secondary thrombocytopenia: Secondary | ICD-10-CM | POA: Diagnosis not present

## 2021-02-28 DIAGNOSIS — D696 Thrombocytopenia, unspecified: Secondary | ICD-10-CM

## 2021-02-28 DIAGNOSIS — T451X5S Adverse effect of antineoplastic and immunosuppressive drugs, sequela: Secondary | ICD-10-CM | POA: Insufficient documentation

## 2021-02-28 DIAGNOSIS — R928 Other abnormal and inconclusive findings on diagnostic imaging of breast: Secondary | ICD-10-CM

## 2021-02-28 LAB — CMP (CANCER CENTER ONLY)
ALT: 23 U/L (ref 0–44)
AST: 21 U/L (ref 15–41)
Albumin: 3.4 g/dL — ABNORMAL LOW (ref 3.5–5.0)
Alkaline Phosphatase: 78 U/L (ref 38–126)
Anion gap: 9 (ref 5–15)
BUN: 9 mg/dL (ref 6–20)
CO2: 23 mmol/L (ref 22–32)
Calcium: 8.6 mg/dL — ABNORMAL LOW (ref 8.9–10.3)
Chloride: 107 mmol/L (ref 98–111)
Creatinine: 0.65 mg/dL (ref 0.44–1.00)
GFR, Estimated: >60 mL/min (ref >60)
Glucose, Bld: 99 mg/dL (ref 70–99)
Potassium: 3.9 mmol/L (ref 3.5–5.1)
Sodium: 139 mmol/L (ref 135–145)
Total Bilirubin: 0.4 mg/dL (ref 0.3–1.2)
Total Protein: 6.3 g/dL — ABNORMAL LOW (ref 6.5–8.1)

## 2021-02-28 LAB — CBC WITH DIFFERENTIAL (CANCER CENTER ONLY)
Abs Immature Granulocytes: 0 10*3/uL (ref 0.00–0.07)
Basophils Absolute: 0 10*3/uL (ref 0.0–0.1)
Basophils Relative: 1 %
Eosinophils Absolute: 0.1 10*3/uL (ref 0.0–0.5)
Eosinophils Relative: 2 %
HCT: 38.7 % (ref 36.0–46.0)
Hemoglobin: 12.8 g/dL (ref 12.0–15.0)
Immature Granulocytes: 0 %
Lymphocytes Relative: 32 %
Lymphs Abs: 1.1 10*3/uL (ref 0.7–4.0)
MCH: 29.2 pg (ref 26.0–34.0)
MCHC: 33.1 g/dL (ref 30.0–36.0)
MCV: 88.4 fL (ref 80.0–100.0)
Monocytes Absolute: 0.4 10*3/uL (ref 0.1–1.0)
Monocytes Relative: 12 %
Neutro Abs: 1.8 10*3/uL (ref 1.7–7.7)
Neutrophils Relative %: 53 %
Platelet Count: 104 10*3/uL — ABNORMAL LOW (ref 150–400)
RBC: 4.38 MIL/uL (ref 3.87–5.11)
RDW: 15.1 % (ref 11.5–15.5)
WBC Count: 3.4 10*3/uL — ABNORMAL LOW (ref 4.0–10.5)
nRBC: 0 % (ref 0.0–0.2)

## 2021-02-28 LAB — CEA (IN HOUSE-CHCC): CEA (CHCC-In House): 2.68 ng/mL (ref 0.00–5.00)

## 2021-02-28 LAB — IRON AND TIBC
Iron: 44 ug/dL (ref 41–142)
Saturation Ratios: 15 % — ABNORMAL LOW (ref 21–57)
TIBC: 300 ug/dL (ref 236–444)
UIBC: 256 ug/dL (ref 120–384)

## 2021-02-28 LAB — FERRITIN: Ferritin: 26 ng/mL (ref 11–307)

## 2021-02-28 LAB — IMMATURE PLATELET FRACTION: Immature Platelet Fraction: 8.8 % — ABNORMAL HIGH (ref 1.2–8.6)

## 2021-02-28 MED ORDER — ETONOGESTREL-ETHINYL ESTRADIOL 0.12-0.015 MG/24HR VA RING: VAGINAL_RING | VAGINAL | 12 refills | Status: DC

## 2021-02-28 NOTE — Progress Notes (Signed)
RGYN patient here to F/U on U/S results.

## 2021-02-28 NOTE — Research (Signed)
TRIAL: [id number] - TREATMENT OF ESTABLISHED CHEMOTHERAPY-INDUCED NEUROPATHY WITH N-PALMITOYLETHANOLAMIDE, A CANNABIMIMETIC NUTRACEUTICAL: A RANDOMIZED DOUBLE-BLIND PHASE II PILOT TRIAL  This Nurse has reviewed this patient's inclusion and exclusion criteria and confirmed Stephanie [name] is eligible for study participation.  Patient will continue with enrollment.  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 for which she is seeking an intervention - Tingling, numbness, or pain symptoms of at least 4/10 - Able to read, speak, and comprehend English - LMP 02/12/21 and that she is NOT sexually active and will continue to abstain during course of treatment if enrolled - Able to swallow oral medication  Patient denied the following during her visit with Research Nurse: - Memory loss or dementia (impaired decision making capacity) - Previous diagnosis of diabetic or non-chemo induced peripheral neuropathy, or neuropathy from HIV infection, or previous history of peripheral neuropathy prior to receiving neurotoxic chemotherapy - Concurrent or planned use of cannabis product (or use in last 4 weeks) - Concurrent, previous, or planned use of PEA - Concurrent or planned use of duloxetine, gabapentin, or pregabalin use  Reviewed and updated medication list with patient (see 03/01/21 phone call with Research Nurse) who confirmed that she stopped taking Tramadol on 02/22/21 and is aware to not take it before/during study.  Menopausal status (women only): Stephanie [name] is pre-menopausal and patient utilizes abstinence for contraception.  She also has an IUD and a Nuvaring (see 03/01/21 phone call with Research Nurse for more info about this and pregnancy test).  [name] '[place], RN, BSN Clinical Research Nurse I 03/01/21 11:28 AM

## 2021-02-28 NOTE — Progress Notes (Signed)
Patient ID: Stephanie [name], female   DOB: 03-14-1981, 40 y.o.   MRN: [id number]  Chief Complaint  Patient presents with  . Follow-up    HPI Stephanie [name] is a 40 y.o. female.  [id number] Patient's last menstrual period was 02/12/2021 (exact date). Patient comes for f/u after an [place] was done to confirm Paragard position and to f/u on irregular bleeding and adnexal cysts seen on CT. Her current pain is not centered on the pelvis HPI  Past Medical History:  Diagnosis Date  . BV (bacterial vaginosis)   . Cancer ([place])   . Family history of bladder cancer   . Lactose intolerance 03/12/2020  . UTI (lower urinary tract infection)     Past Surgical History:  Procedure Laterality Date  . CESAREAN SECTION    . CYSTOSCOPY WITH STENT PLACEMENT  04/04/2020   Procedure: CYSTOSCOPY WITH STENT PLACEMENT;  Surgeon: [name], MD;  Location: [facility];  Service: General;;  . LAPAROSCOPIC LIVER ULTRASOUND N/A 12/13/2020   Procedure: INTRAOPERATIVE LIVER ULTRASOUND;  Surgeon: [name], MD;  Location: [place];  Service: General;  Laterality: N/A;  . LAPAROSCOPY N/A 12/13/2020   Procedure: LAPAROSCOPY DIAGNOSTIC;  Surgeon: [name], MD;  Location: [place];  Service: General;  Laterality: N/A;  . LAPAROTOMY N/A 04/04/2020   Procedure: EXPLORATORY LAPAROTOMY;  Surgeon: [name], MD;  Location: [facility];  Service: General;  Laterality: N/A;  . OPEN PARTIAL HEPATECTOMY  N/A 12/13/2020   Procedure: OPEN PARTIAL HEPATECTOMY;  Surgeon: [name], MD;  Location: [place];  Service: General;  Laterality: N/A;  ROOM 2 STARTING AT 09:30AM FOR 300 MIN  . PORTACATH PLACEMENT Right 04/12/2020   Procedure: INSERTION PORT-A-CATH WITH ULTRASOUND;  Surgeon: Kinsinger, Arta Bruce, MD;  Location: [facility];  Service: General;  Laterality: Right;    Family History  Problem Relation Age of Onset  . Heart disease Father   . Stroke Father   . Aneurysm Father   . Bladder Cancer Maternal Grandmother        dx 90s   . Colon cancer Neg Hx   . Esophageal cancer Neg Hx     Social History Social History   Tobacco Use  . Smoking status: Former Smoker    Packs/day: 0.50    Years: 10.00    Pack years: 5.00    Types: Cigarettes  . Smokeless tobacco: Never Used  Vaping Use  . Vaping Use: Never used  Substance Use Topics  . Alcohol use: Yes    Comment: seldom  . Drug use: No    Allergies  Allergen Reactions  . Oxaliplatin Shortness Of Breath    Current Outpatient Medications  Medication Sig Dispense Refill  . acetaminophen (TYLENOL) 325 MG tablet Take 650 mg by mouth daily as needed for fever or headache (pain).    [name] acetaminophen (TYLENOL) 325 MG tablet Take 2 tablets (650 mg total) by mouth every 6 (six) hours as needed for mild pain (or Fever >/= 101).    [name] etonogestrel-ethinyl estradiol (NUVARING) 0.12-0.015 MG/24HR vaginal ring Insert vaginally and leave in place for 3 consecutive weeks, then remove for 1 week. 1 each 12  . ibuprofen (ADVIL) 200 MG tablet Take 800 mg by mouth every 6 (six) hours as needed for fever, headache or mild pain.    [name] LORazepam (ATIVAN) 0.5 MG tablet Take 1 tablet (0.5 mg total) by mouth 2 (two) times daily as needed for anxiety. 30 tablet 0  . oxyCODONE (OXY IR/ROXICODONE) 5  MG immediate release tablet Take 5 mg by mouth every 6 (six) hours as needed.    [name] PARAGARD INTRAUTERINE COPPER IU 1 Device by Intrauterine route once.     . traMADol (ULTRAM) 50 MG tablet Take 1 tablet (50 mg total) by mouth every 12 (twelve) hours as needed for severe pain. 30 tablet 0  . methocarbamol (ROBAXIN) 500 MG tablet Take 1 tablet (500 mg total) by mouth every 6 (six) hours as needed for muscle spasms. (Patient not taking: No sig reported) 20 tablet 1  . potassium chloride SA (KLOR-CON) 20 MEQ tablet Take 1 tablet (20 mEq total) by mouth daily. (Patient not taking: No sig reported) 30 tablet 1  . prochlorperazine (COMPAZINE) 10 MG tablet Take 1 tablet (10 mg total) by mouth every 6  (six) hours as needed (Nausea or vomiting). (Patient not taking: No sig reported) 30 tablet 5  . senna-docusate (SENOKOT-S) 8.6-50 MG tablet Take 2 tablets by mouth 2 (two) times daily. (Patient not taking: No sig reported) 120 tablet 0   No current facility-administered medications for this visit.    Review of Systems Review of Systems  Respiratory: Negative.   Gastrointestinal: Positive for abdominal pain.  Genitourinary: Positive for vaginal bleeding (some spotting). Negative for pelvic pain.    Blood pressure 103/70, pulse 76, weight 152 lb (68.9 kg), last menstrual period 02/12/2021.  Physical Exam Physical Exam Vitals and nursing note reviewed.  Constitutional:      Appearance: Normal appearance.  Pulmonary:     Effort: Pulmonary effort is normal.  Neurological:     Mental Status: She is alert.  Psychiatric:        Mood and Affect: Mood normal.        Behavior: Behavior normal.     Data Reviewed Narrative & Impression  CLINICAL DATA:  Status post partial liver resection now with persistent abdominal pain and abdominal distension.  EXAM: CT ABDOMEN AND PELVIS WITHOUT CONTRAST  TECHNIQUE: Multidetector CT imaging of the abdomen and pelvis was performed following the standard protocol without IV contrast.  COMPARISON:  12/30/2020  FINDINGS: Lower chest: No acute abnormality.  Hepatobiliary: Surgical changes of partial right hepatic resection are again identified. While not optimally assessed in absence of contrast administration, the previously noted perihepatic fluid collection adjacent to the surgical margin has improved, measuring roughly 2.1 x 5.4 cm on axial image # 18/2. Previously noted subdiaphragmatic gas and fluid beneath the right hemidiaphragm has resolved. Surgical drainage catheter within this region has been removed. The liver is otherwise unremarkable. Cholecystectomy has been performed.  Pancreas: Unremarkable  Spleen:  Unremarkable  Adrenals/Urinary Tract: Adrenal glands are unremarkable. Kidneys are normal, without renal calculi, focal lesion, or hydronephrosis. Bladder is unremarkable.  Stomach/Bowel: There is fluid distension of the a first and second portion of the duodenum to the level of the SMA hiatus which appears narrow measuring 6 mm in dimension. This is similar to that seen on multiple prior examinations and the stomach, however, is decompressed, arguing against a obstruction secondary to extrinsic compression (SMA syndrome). 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 There is moderate stool throughout the colon. No free intraperitoneal gas or fluid. The small bowel is unremarkable. The appendix is normal.  Vascular/Lymphatic: No significant vascular findings are present. No enlarged abdominal or pelvic lymph nodes.  Reproductive: Intrauterine device in expected position within the uterine cavity. The pelvic organs are otherwise unremarkable.  Other: Small broad-based umbilical hernia contains a single wall  of the transverse colon.  Musculoskeletal: No acute bone abnormality. No lytic or blastic bone lesion.  IMPRESSION: Slightly limited examination examination in absence of contrast administration. Status post partial right hepatectomy. Interval decrease in size in perihepatic fluid collection and resolution of right subdiaphragmatic fluid and gas. No new intra-abdominal fluid collections are identified.  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 Moderate stool throughout the colon without evidence of obstruction.  Fluid distension of the proximal duodenum to the level of the SMA hiatus which appears narrow. 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   Electronically Signed    By: Fidela Salisbury MD   On: 02/19/2021 23:11   CLINICAL DATA:  Evaluate intrauterine device placement.  EXAM: TRANSABDOMINAL AND TRANSVAGINAL ULTRASOUND OF PELVIS  TECHNIQUE: Both transabdominal and transvaginal ultrasound examinations of the pelvis were performed. Transabdominal technique was performed for global imaging of the pelvis including uterus, ovaries, adnexal regions, and pelvic cul-de-sac. It was necessary to proceed with endovaginal exam following the transabdominal exam to visualize the endometrium.  COMPARISON:  CT abdomen pelvis 12/30/2020  FINDINGS: Uterus  Measurements: 9.4 x 4.3 x 4.5 cm = volume: 95.4 mL. Within the anterior right uterine fundus there is a 2.5 x 2.0 x 2.4 cm intramural fibroid.  Endometrium  Thickness: 4 mm. Intrauterine device is present within the endometrial cavity. The exact location is somewhat difficult to visualize given positioning of the uterus.  Right ovary  Measurements: 3.1 x 2.9 x 1.9 cm = volume: 8.9 mL. Normal appearance/no adnexal mass.  Left ovary  Measurements: 5.4 x 3.6 x 4.0 cm = volume: 40.4 mL. There is a 2.0 x 3.5 x 3.6 cm complex cyst within the left ovary, potentially representing a hemorrhagic cyst.  Other findings  Small amount of free fluid in the pelvis.  IMPRESSION: Complex cystic lesion left ovary, potentially representing a hemorrhagic cyst. Short-interval follow up ultrasound in 6-12 weeks is recommended, preferably during the week following the patient's normal menses.  Intrauterine device is visualized within the endometrial canal. The exact location is difficult to discern given the positioning of the uterus.   Electronically Signed   By: Lovey Newcomer M.D.   On: 02/13/2021 08:10  Assessment Hemorrhagic left ovarian cyst on [place], not seen on f/u CT IUD in place Unsure about removing the IUD or trying LNGIUD instead. She would like to try NuvaRing for cycle control and  f/u US for confirmation the cyst is resolved  Plan Orders Placed This Encounter  Procedures  . US PELVIC COMPLETE WITH TRANSVAGINAL    Standing Status:   Future    Standing Expiration Date:   02/28/2022    Order Specific Question:   Reason for Exam (SYMPTOM  OR DIAGNOSIS REQUIRED)    Answer:   ovarian cyst    Order Specific Question:   Preferred imaging location?    Answer:   [facility]    Meds ordered this encounter  Medications  . etonogestrel-ethinyl estradiol (NUVARING) 0.12-0.015 MG/24HR vaginal ring    Sig: Insert vaginally and leave in place for 3 consecutive weeks, then remove for 1 week.    Dispense:  1 each    Refill:  12   RTC 3 months   [name] 02/28/2021, 6:47 PM

## 2021-02-28 NOTE — Research (Signed)
Trial:  ACCRU-[place]-2102 - TREATMENT OF ESTABLISHED CHEMOTHERAPY-INDUCED NEUROPATHY WITH N-PALMITOYLETHANOLAMIDE, A CANNABIMIMETIC NUTRACEUTICAL: A RANDOMIZED DOUBLE-BLIND PHASE II PILOT TRIAL  Patient Stephanie [name] was identified by this Nutritional therapist as a potential candidate for the above listed study.  This Clinical Research Nurse met with Stephanie [name], [id number], on 02/28/21 in a manner and location that ensures patient privacy to discuss participation in the above listed research study.  Patient is Unaccompanied.  A copy of the informed consent document with embedded HIPAA language was provided to the patient.  Patient confirmed that she reads, speaks, and understands [place].   Patient was provided with the business card of this Nurse and encouraged to contact the research team with any questions.  Approximately 60 minutes were spent with the patient reviewing the informed consent documents.  Patient was provided the option of taking informed consent documents home to review and was encouraged to review at their convenience with their support network, including other care providers. Patient took the consent documents home to review.  Will f/u with patient on 03/01/21 regarding interest in study.  DCP-001 is not indicated for this study.  [name] '[place], RN, BSN Clinical Research Nurse I 03/01/21 11:31 AM

## 2021-02-28 NOTE — Patient Instructions (Signed)
Etonogestrel; Ethinyl Estradiol Vaginal Ring What is this medicine? ETONOGESTREL; ETHINYL ESTRADIOL (et oh noe JES trel; ETH in il es tra DYE ole) vaginal ring is a flexible, vaginal ring used as a contraceptive (birth control method). This product combines two types of female hormones, an estrogen and a progestin. It is used to prevent ovulation and pregnancy. Each ring is effective for 1 month. This medicine may be used for other purposes; ask your health care provider or pharmacist if you have questions. COMMON BRAND NAME(S): EluRyng, NuvaRing What should I tell my health care provider before I take this medicine? They need to know if you have any of these conditions: abnormal vaginal bleeding blood vessel disease or blood clots breast, cervical, endometrial, ovarian, liver, or uterine cancer diabetes gallbladder disease having surgery heart disease or recent heart attack high blood pressure high cholesterol or triglycerides history of irregular heartbeat or heart valve problems kidney disease liver disease migraine headaches protein C deficiency protein S deficiency recently had a baby, miscarriage, or abortion stroke systemic lupus erythematosus (SLE) tobacco smoker your age is more than 41 years old an unusual or allergic reaction to estrogens, progestins, other medicines, foods, dyes, or preservatives pregnant or trying to get pregnant breast-feeding How should I use this medicine? Insert the ring into your vagina as directed. Follow the directions on the prescription label. The ring will remain place for 3 weeks and is then removed for a 1-week break. A new ring is inserted 1 week after the last ring was removed, on the same day of the week. Check often to make sure the ring is still in place. If the ring was out of the vagina for an unknown amount of time, you may not be protected from pregnancy. Perform a pregnancy test and call your doctor. Do not use more often than  directed. A patient package insert for the product will be given with each prescription and refill. Read this sheet carefully each time. The sheet may change frequently. Contact your pediatrician regarding the use of this medicine in children. Special care may be needed. Overdosage: If you think you have taken too much of this medicine contact a poison control center or emergency room at once. NOTE: This medicine is only for you. Do not share this medicine with others. What if I miss a dose? You will need to use the ring exactly as directed. It is very important to follow the schedule every cycle. If you do not use the ring as directed, you may not be protected from pregnancy. If the ring should slip out, is lost, or if you leave it in longer or shorter than you should, contact your health care professional for advice. What may interact with this medicine? Do not take this medicine with the following medications: dasabuvir; ombitasvir; paritaprevir; ritonavir ombitasvir; paritaprevir; ritonavir vaginal lubricants or other vaginal products that are oil-based or silicone-based This medicine may also interact with the following medications: acetaminophen antibiotics or medicines for infections, especially rifampin, rifabutin, rifapentine, and griseofulvin, and possibly penicillins or tetracyclines aprepitant or fosaprepitant armodafinil ascorbic acid (vitamin C) barbiturate medicines, such as phenobarbital or primidone bosentan certain antiviral medicines for hepatitis, HIV or AIDS certain medicines for cancer treatment certain medicines for seizures like carbamazepine, clobazam, felbamate, lamotrigine, oxcarbazepine, phenytoin, rufinamide, topiramate certain medicines for treating high cholesterol cyclosporine dantrolene elagolix flibanserin grapefruit juice lesinurad medicines for diabetes medicines to treat fungal infections, such as griseofulvin, miconazole, fluconazole, ketoconazole,  itraconazole, posaconazole or voriconazole mifepristone mitotane modafinil   morphine mycophenolate St. John's wort tamoxifen temazepam theophylline or aminophylline thyroid hormones tizanidine tranexamic acid ulipristal warfarin This list may not describe all possible interactions. Give your health care provider a list of all the medicines, herbs, non-prescription drugs, or dietary supplements you use. Also tell them if you smoke, drink alcohol, or use illegal drugs. Some items may interact with your medicine. What should I watch for while using this medicine? Visit your doctor or health care professional for regular checks on your progress. You will need a regular breast and pelvic exam and Pap smear while on this medicine. Check with your doctor or health care professional to see if you need an additional method of contraception during the first cycle that you use this ring. Female condoms (made with natural rubber latex, polyisoprene, and polyurethane) and spermicides may be used. Do not use a diaphragm, cervical cap, or a female condom, as the ring can interfere with these birth control methods and their proper placement. If you have any reason to think you are pregnant, stop using this medicine right away and contact your doctor or health care professional. If you are using this medicine for hormone related problems, it may take several cycles of use to see improvement in your condition. Smoking increases the risk of getting a blood clot or having a stroke while you are using hormonal birth control, especially if you are more than 41 years old. You are strongly advised not to smoke. Some women are prone to getting dark patches on the skin of the face (cholasma). Your risk of getting chloasma with this medicine is higher if you had chloasma during a pregnancy. Keep out of the sun. If you cannot avoid being in the sun, wear protective clothing and use sunscreen. Do not use sun lamps or tanning  beds/booths. This medicine can make your body retain fluid, making your fingers, hands, or ankles swell. Your blood pressure can go up. Contact your doctor or health care professional if you feel you are retaining fluid. If you are going to have elective surgery, you may need to stop using this medicine before the surgery. Consult your health care professional for advice. This medicine does not protect you against HIV infection (AIDS) or any other sexually transmitted diseases. What side effects may I notice from receiving this medicine? Side effects that you should report to your doctor or health care professional as soon as possible: allergic reactions such as skin rash or itching, hives, swelling of the lips, mouth, tongue, or throat depression high blood pressure migraines or severe, sudden headaches signs and symptoms of a blood clot such as breathing problems; changes in vision; chest pain; severe, sudden headache; pain, swelling, warmth in the leg; trouble speaking; sudden numbness or weakness of the face, arm or leg signs and symptoms of infection like fever or chills with dizziness and a sunburn-like rash, or pain or trouble passing urine stomach pain symptoms of vaginal infection like itching, irritation or unusual discharge yellowing of the eyes or skin Side effects that usually do not require medical attention (report these to your doctor or health care professional if they continue or are bothersome): acne breast pain, tenderness irregular vaginal bleeding or spotting, particularly during the first month of use mild headache nausea painful periods vomiting This list may not describe all possible side effects. Call your doctor for medical advice about side effects. You may report side effects to FDA at 1-800-FDA-1088. Where should I keep my medicine? Keep out of the   health care professional if they continue or are bothersome):  acne  breast pain, tenderness  irregular vaginal bleeding or spotting, particularly during the first month of use  mild headache  nausea  painful periods  vomiting This list may not describe all possible side effects. Call your doctor for medical advice about side effects. You may report side effects to FDA at 1-800-FDA-1088. Where should I keep  my medicine? Keep out of the reach of children. Store unopened medicine for up to 4 months at room temperature at 15 and 30 degrees C (59 and 86 degrees F). Protect from light. Do not store above 30 degrees C (86 degrees F). Throw away any unused medicine 4 months after the dispense date or the expiration date, whichever comes first. A ring may only be used for 1 cycle (1 month). After the 3-week cycle, a used ring is removed and should be placed in the re-closable foil pouch and discarded in the trash out of reach of children and pets. Do NOT flush down the toilet. NOTE: This sheet is a summary. It may not cover all possible information. If you have questions about this medicine, talk to your doctor, pharmacist, or health care provider.  2021 Elsevier/Gold Standard (2019-08-03 23:36:12)

## 2021-03-01 ENCOUNTER — Encounter: Payer: Self-pay | Admitting: Hematology

## 2021-03-01 ENCOUNTER — Telehealth: Payer: Self-pay | Admitting: Hematology

## 2021-03-01 ENCOUNTER — Telehealth: Payer: Self-pay | Admitting: Emergency Medicine

## 2021-03-01 NOTE — Telephone Encounter (Signed)
Scheduled follow-up appointments per 6/2 los. Patient is aware.

## 2021-03-01 NOTE — Telephone Encounter (Signed)
TRIAL: ACCRU-[place]-2102 - TREATMENT OF ESTABLISHED CHEMOTHERAPY-INDUCED NEUROPATHY WITH N-PALMITOYLETHANOLAMIDE, A CANNABIMIMETIC NUTRACEUTICAL: A RANDOMIZED DOUBLE-BLIND PHASE II PILOT TRIAL  Contacted pt to f/u on potential interest in [facility]CRU [facility] 2102 study.  Pt states she is interested but will need to arrange child care next week for the duration of her appts.  Pt states she will call this Research Nurse call regarding best date/time for her appts.  Pt confirmed that she has both an IUD and a NuvaRing for ovarian cyst management and contraception.  Pt aware that (as she is of child-bearing potential) if she signs consent she will need to have negative pregnancy test before enrollment.  Will order lab test once pt confirms appt date/time (to be done once patient signs consent forms).  Confirmed and updated medication list with patient.  Pt stopped taking her Tramadol a week ago (02/22/21) and verbalizes understanding to not take any before or during study.  [name] '[place], RN, BSN Clinical Research Nurse I 03/01/21 11:23 AM

## 2021-03-04 ENCOUNTER — Telehealth: Payer: Self-pay | Admitting: Emergency Medicine

## 2021-03-04 DIAGNOSIS — C19 Malignant neoplasm of rectosigmoid junction: Secondary | ICD-10-CM

## 2021-03-04 NOTE — Telephone Encounter (Signed)
TRIAL: ACCRU-[place]-2102 - TREATMENT OF ESTABLISHED CHEMOTHERAPY-INDUCED NEUROPATHY WITH N-PALMITOYLETHANOLAMIDE, A CANNABIMIMETIC NUTRACEUTICAL: A RANDOMIZED DOUBLE-BLIND PHASE II PILOT TRIAL  Called pt to confirm continued interest in study, pt confirms.  Confirmed best date/time for consent appt (and baseline assessments including lab appt for pregnancy test) as Thursday at 10 am.  Pt aware to call back as needed with any questions/concerns before then.  Appts created, orders in place.  [name] '[place], RN, BSN Clinical Research Nurse I 03/04/21 9:12 AM

## 2021-03-06 ENCOUNTER — Other Ambulatory Visit: Payer: Self-pay | Admitting: Nurse Practitioner

## 2021-03-06 MED ORDER — LORAZEPAM 0.5 MG PO TABS: 0.5000 mg | ORAL_TABLET | Freq: Two times a day (BID) | ORAL | 0 refills | Status: DC | PRN

## 2021-03-07 ENCOUNTER — Inpatient Hospital Stay: Payer: Medicaid Other | Admitting: Emergency Medicine

## 2021-03-07 ENCOUNTER — Inpatient Hospital Stay: Payer: Medicaid Other

## 2021-03-07 ENCOUNTER — Other Ambulatory Visit: Payer: Self-pay

## 2021-03-07 DIAGNOSIS — C19 Malignant neoplasm of rectosigmoid junction: Secondary | ICD-10-CM

## 2021-03-07 DIAGNOSIS — Z85048 Personal history of other malignant neoplasm of rectum, rectosigmoid junction, and anus: Secondary | ICD-10-CM | POA: Diagnosis not present

## 2021-03-07 LAB — PREGNANCY, URINE: Preg Test, Ur: NEGATIVE

## 2021-03-07 NOTE — Research (Signed)
TRIAL: ACCRU-[place]-2102 - TREATMENT OF ESTABLISHED CHEMOTHERAPY-INDUCED NEUROPATHY WITH N-PALMITOYLETHANOLAMIDE, A CANNABIMIMETIC NUTRACEUTICAL: A RANDOMIZED DOUBLE-BLIND PHASE II PILOT TRIAL  Patient Stephanie [name], was registered to the above listed study, assigned study# [id number].  Randomization is required for this study. Randomization information to follow.  Designated unblinded pharmacist Raul Del PharmD provided with registration information and has faxed Drug Order Form.  Brandy to keep record of drug order form as it contains information regarding patient's randomization assignment.  This Research Nurse will contact patient once her drug has been received so she can pick it up to start treatment.  [name] '[place], RN, BSN Clinical Research Nurse I 03/07/21 2:58 PM

## 2021-03-07 NOTE — Research (Signed)
TRIAL: [facility]-2102 - TREATMENT OF ESTABLISHED CHEMOTHERAPY-INDUCED NEUROPATHY WITH N-PALMITOYLETHANOLAMIDE, A CANNABIMIMETIC NUTRACEUTICAL: A RANDOMIZED DOUBLE-BLIND PHASE II PILOT TRIAL   03/07/2021  Patient arrives today Unaccompanied for the Baseline visit.   PROs: Per study protocol, all PROs required for this visit were completed after signing consent and completeness has been verified.     MEDICATION DIARIES and IRT:  Patient will be provided with weekly booklets (weeks 1-8) and pre-paid mailing envelopes when she picks up her drug (which has been ordered by pharmacy today) next week.  Patient has been educated on completing and mailing these PRO booklets back weekly, as well as on weekly phone calls from this Research Nurse.  Pt verbalized understanding of all instructions.  Patient will be provided with her prescription, along with administration education, once her drug arrives next week.  Pt aware to expect call from this Research Nurse once drug arrives.  DISPOSITION: Upon completion off all study requirements, patient was escorted to exit with belongings.   The patient was thanked for their time and continued voluntary participation in this study. Patient Stephanie [name] has been provided direct contact information and is encouraged to contact this Nurse for any needs or questions.  [name] 'McPherson, RN, BSN Clinical Research Nurse I 03/07/21 3:27 PM

## 2021-03-07 NOTE — Research (Signed)
Trial Name:  [facility]-[place]-2102 - TREATMENT OF ESTABLISHED CHEMOTHERAPY-INDUCED NEUROPATHY WITH N-PALMITOYLETHANOLAMIDE, A CANNABIMIMETIC NUTRACEUTICAL: A RANDOMIZED DOUBLE-BLIND PHASE II PILOT TRIAL  Patient Stephanie [name] was identified by this Nutritional therapist as a potential candidate for the above listed study.  This Clinical Research Nurse met with Stephanie [name], [id number] on 03/07/21 in a manner and location that ensures patient privacy to discuss participation in the above listed research study.  Patient is Unaccompanied.  Patient was previously provided with informed consent documents.  Patient confirmed they have read the informed consent documents.  As outlined in the informed consent form, this Nurse and Arnaldo Natal discussed the purpose of the research study, the investigational nature of the study, study procedures and requirements for study participation, potential risks and benefits of study participation, as well as alternatives to participation.  This study is double-blinded. The patient understands participation is voluntary and they may withdraw from study participation at any time.  Each study arm was reviewed, and randomization discussed.  Potential side effects were reviewed with patient as outlined in the consent form, and patient made aware there may be side effects not yet known. The chance of receiving placebo was discussed. Patient understands enrollment is pending full eligibility review.   Confidentiality and how the patient's information will be used as part of study participation were discussed.  Patient was informed there is not reimbursement provided for their time and effort spent on trial participation.  The patient is encouraged to discuss research study participation with their insurance provider to determine what costs they may incur as part of study participation, including research related injury.    All questions were answered to patient's satisfaction.   The informed consent with embedded HIPAA language was reviewed page by page.  The patient's mental and emotional status is appropriate to provide informed consent, and the patient verbalizes an understanding of study participation.  Patient has agreed to participate in the above listed research study and has voluntarily signed the informed consent version date 12/31/2020 and with embedded HIPAA language, version date 12/31/2020  on 03/07/21 at 10:32 AM.  The patient was provided with a copy of the signed informed consent form with embedded HIPAA language for their reference.  No study specific procedures were obtained prior to the signing of the informed consent document.  Approximately 30 minutes were spent with the patient reviewing the informed consent documents.  Patient was not requested to complete a Release of Information form.  [name] '[place]annett, RN, BSN Clinical Research Nurse I 03/07/21 12:10 PM

## 2021-03-07 NOTE — Research (Signed)
[facility]-2102 - TREATMENT OF ESTABLISHED CHEMOTHERAPY-INDUCED NEUROPATHY WITH N-PALMITOYLETHANOLAMIDE, A CANNABIMIMETIC NUTRACEUTICAL: A RANDOMIZED DOUBLE-BLIND PHASE II PILOT TRIAL   This Nurse has reviewed this patient's inclusion and exclusion criteria as a second review and confirms Stephanie [name] is eligible for study participation.  Patient may continue with enrollment.  Foye Spurling, BSN, RN Clinical Research Nurse 03/07/2021 12:38 PM

## 2021-03-07 NOTE — Research (Signed)
TRIAL: [facility]-2102 - TREATMENT OF ESTABLISHED CHEMOTHERAPY-INDUCED NEUROPATHY WITH N-PALMITOYLETHANOLAMIDE, A CANNABIMIMETIC NUTRACEUTICAL: A RANDOMIZED DOUBLE-BLIND PHASE II PILOT TRIAL  FINAL ELIGIBILITY CHECK   This Nurse has reviewed this patient's inclusion and exclusion criteria and confirmed Stephanie [name] is eligible for study participation.  Patient will continue with enrollment.   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  Pt verbally states her score today (03/07/21) for peripheral neuropathy is 8/10. - Able to read, speak, and comprehend English - LMP 02/12/21 and that she is NOT sexually active and will continue to abstain during course of treatment.  Pt states that she also has both a Copper IUD in place and is using Nuvaring as additional contraception. - Able to swallow oral medication   Patient denied the following during her visit with Research Nurse: - Memory loss or dementia (impaired decision making capacity) - Previous diagnosis of diabetic or non-chemo induced peripheral neuropathy, or neuropathy from HIV infection, or previous history of peripheral neuropathy prior to receiving neurotoxic chemotherapy - Concurrent or planned use of cannabis product (or use in last 4 weeks) - Concurrent, previous, or planned use of PEA - Concurrent or planned use of duloxetine, gabapentin, or pregabalin use   Reviewed medication and allergy list with patient who denied any changes since last Research visit.   Menopausal status (women only): Stephanie [name] is pre-menopausal and patient utilizes abstinence for contraception.  She also has an IUD and a Nuvaring.  Patient's urine pregnancy test today (03/07/21) was negative.  Discussed patient's life expectancy with MD Burr Medico before enrollment, MD verbally confirmed > 6 mo  life expectancy.  [name] '[place], RN, BSN Clinical Research Nurse I 03/07/21 12:44 PM

## 2021-03-08 ENCOUNTER — Encounter: Payer: Self-pay | Admitting: Hematology

## 2021-03-08 DIAGNOSIS — C19 Malignant neoplasm of rectosigmoid junction: Secondary | ICD-10-CM | POA: Insufficient documentation

## 2021-03-09 ENCOUNTER — Other Ambulatory Visit: Payer: Self-pay | Admitting: Hematology

## 2021-03-11 ENCOUNTER — Other Ambulatory Visit: Payer: Self-pay

## 2021-03-11 ENCOUNTER — Ambulatory Visit: Payer: Medicaid Other

## 2021-03-11 ENCOUNTER — Ambulatory Visit
Admission: RE | Admit: 2021-03-11 | Discharge: 2021-03-11 | Disposition: A | Discharge status: Home / Self Care | Payer: Medicaid Other | Source: Ambulatory Visit | Attending: Obstetrics & Gynecology | Admitting: Obstetrics & Gynecology

## 2021-03-11 DIAGNOSIS — R928 Other abnormal and inconclusive findings on diagnostic imaging of breast: Secondary | ICD-10-CM

## 2021-03-11 IMAGING — MG MM DIGITAL DIAGNOSTIC UNILAT*R* W/ TOMO W/ CAD
4 series · 4 of 12 positions shown · non-contrast
Comparison: Previous exam(s).

CLINICAL DATA: 39-year-old female recalled from baseline screening
mammogram dated [DATE] for a possible right breast asymmetry.

EXAM:
DIGITAL DIAGNOSTIC UNILATERAL RIGHT MAMMOGRAM WITH TOMOSYNTHESIS AND
CAD
TECHNIQUE: Right digital diagnostic mammography and breast tomosynthesis was
performed. The images were evaluated with computer-aided detection.

[R CC synth-2D]
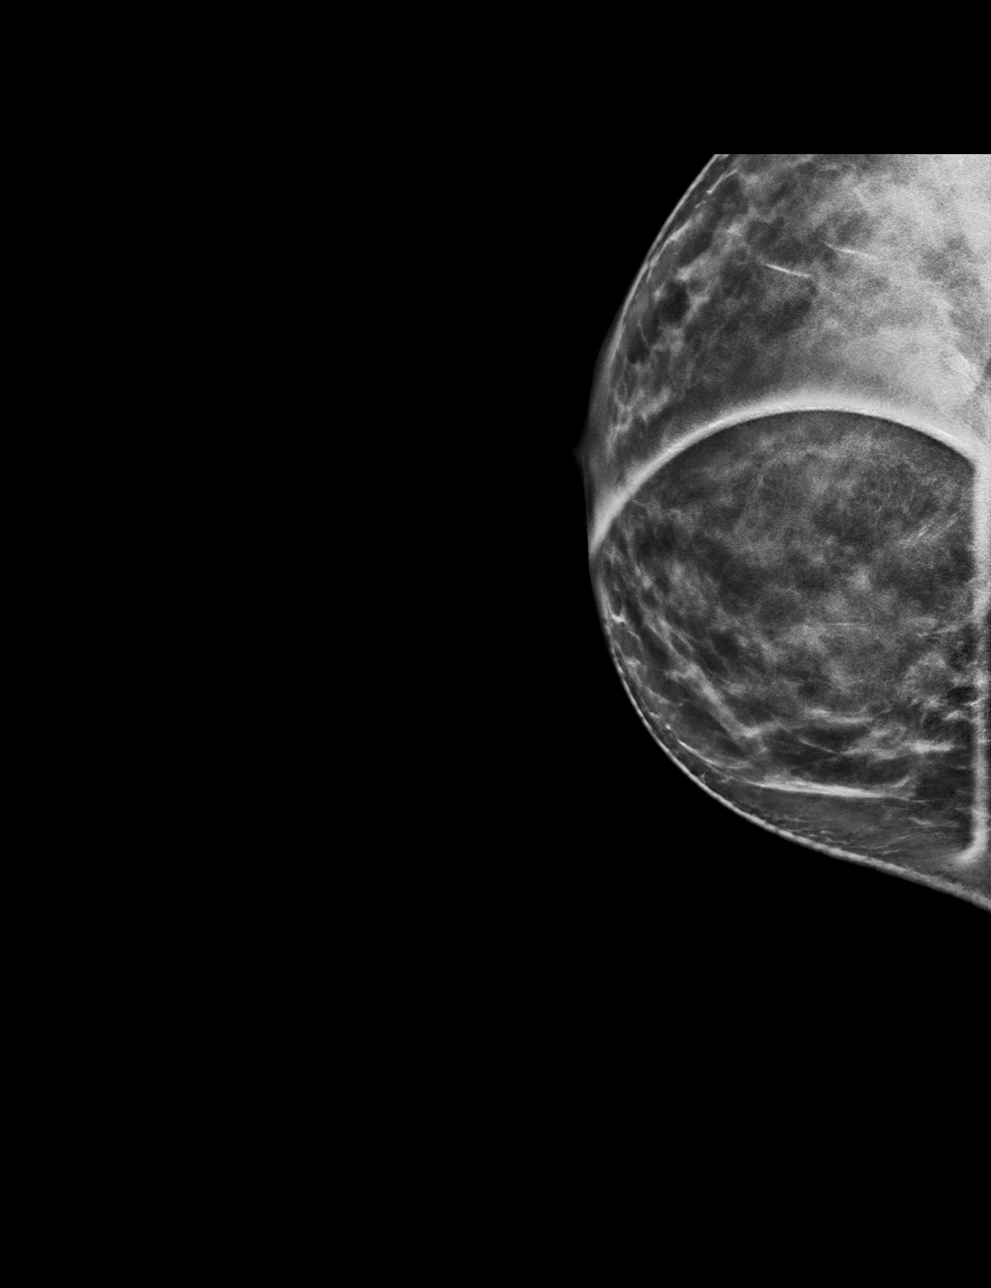

[R MLO synth-2D]
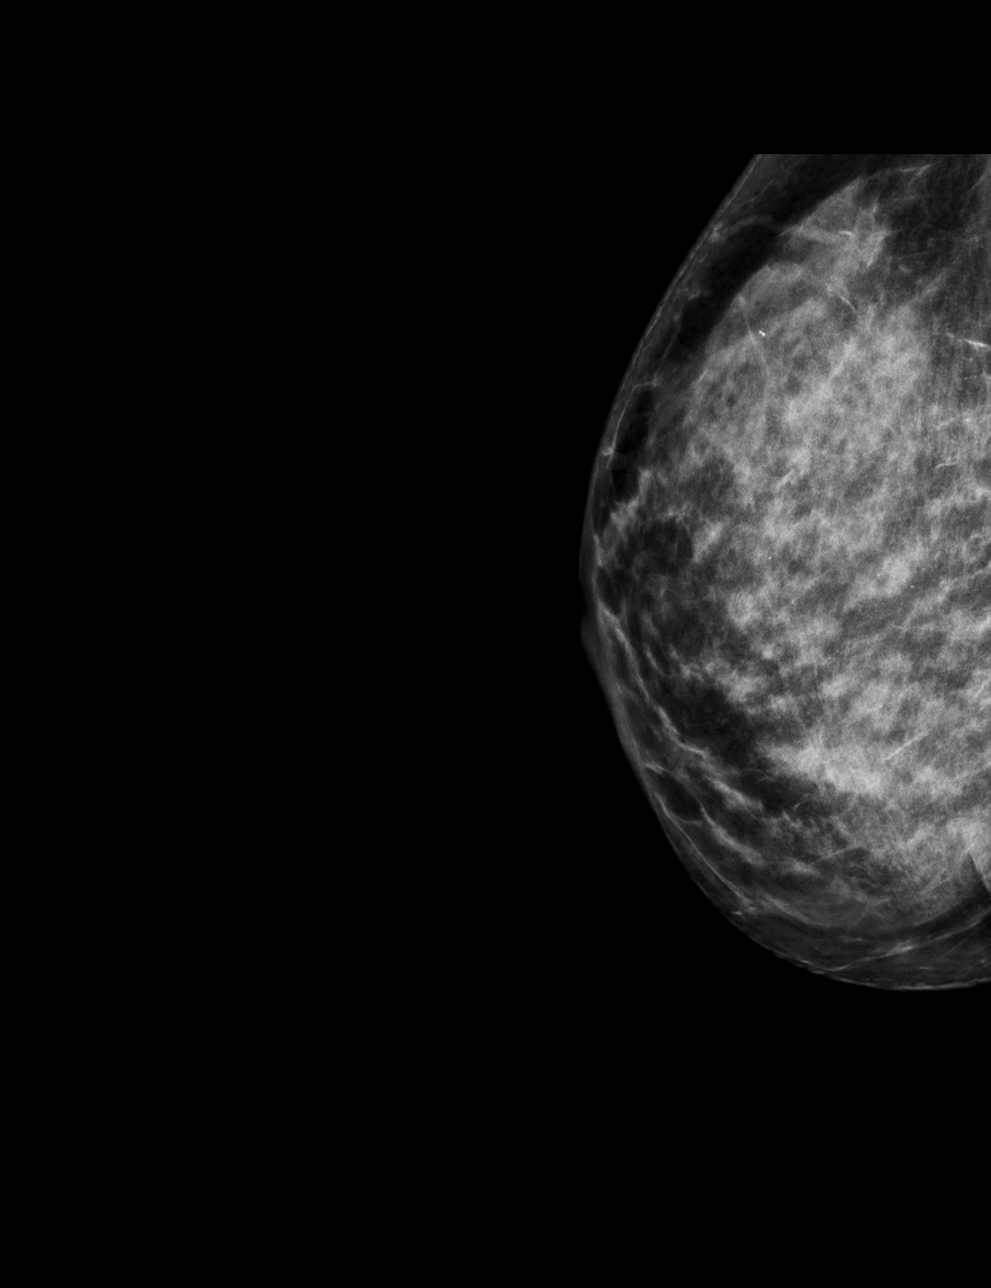

[R CC tomo · tomo slice 38/75.0]
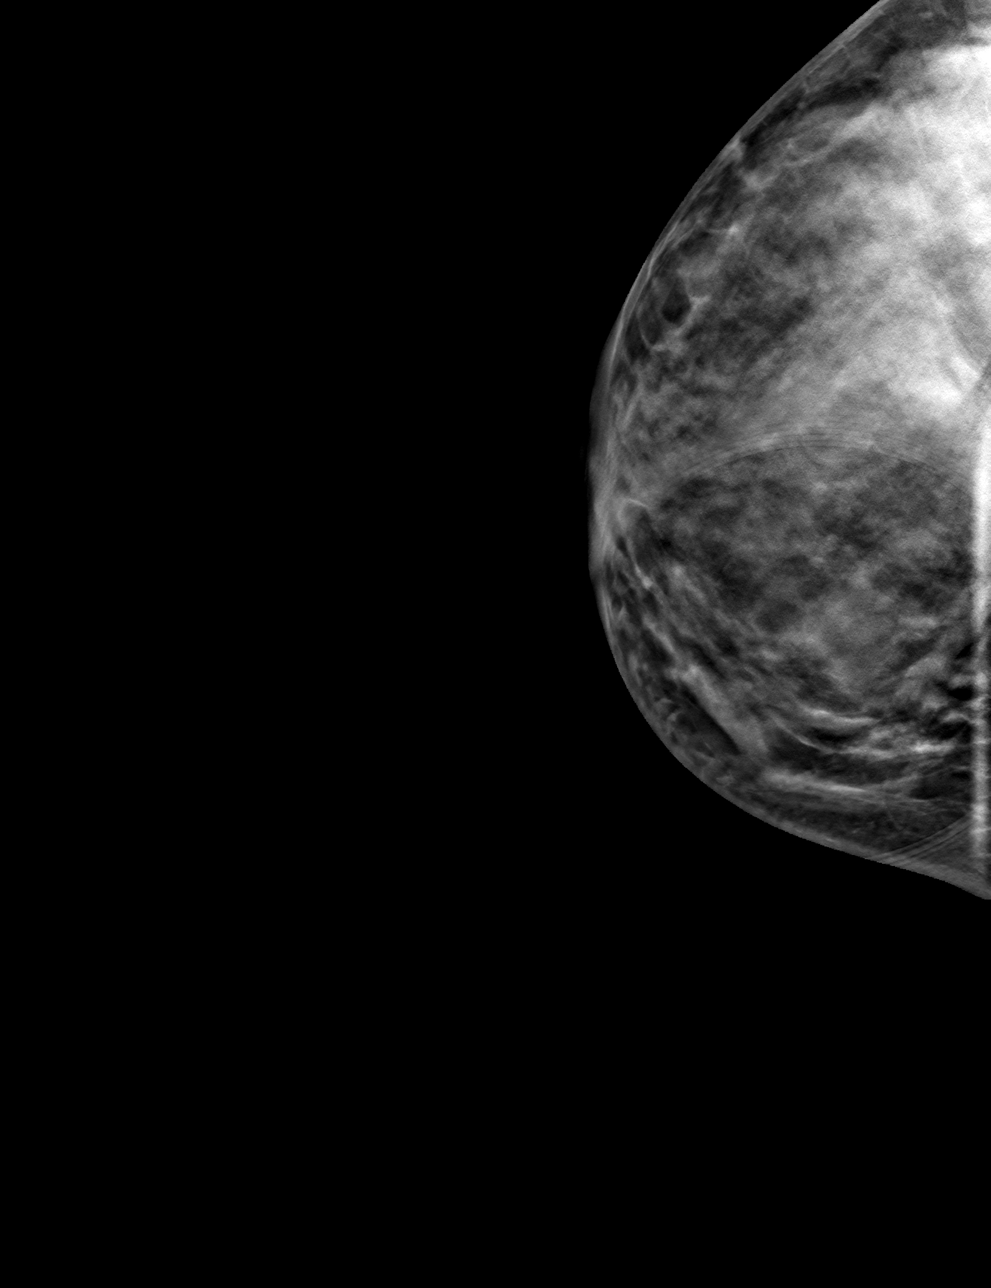

[R MLO tomo · tomo slice 37/72.0]
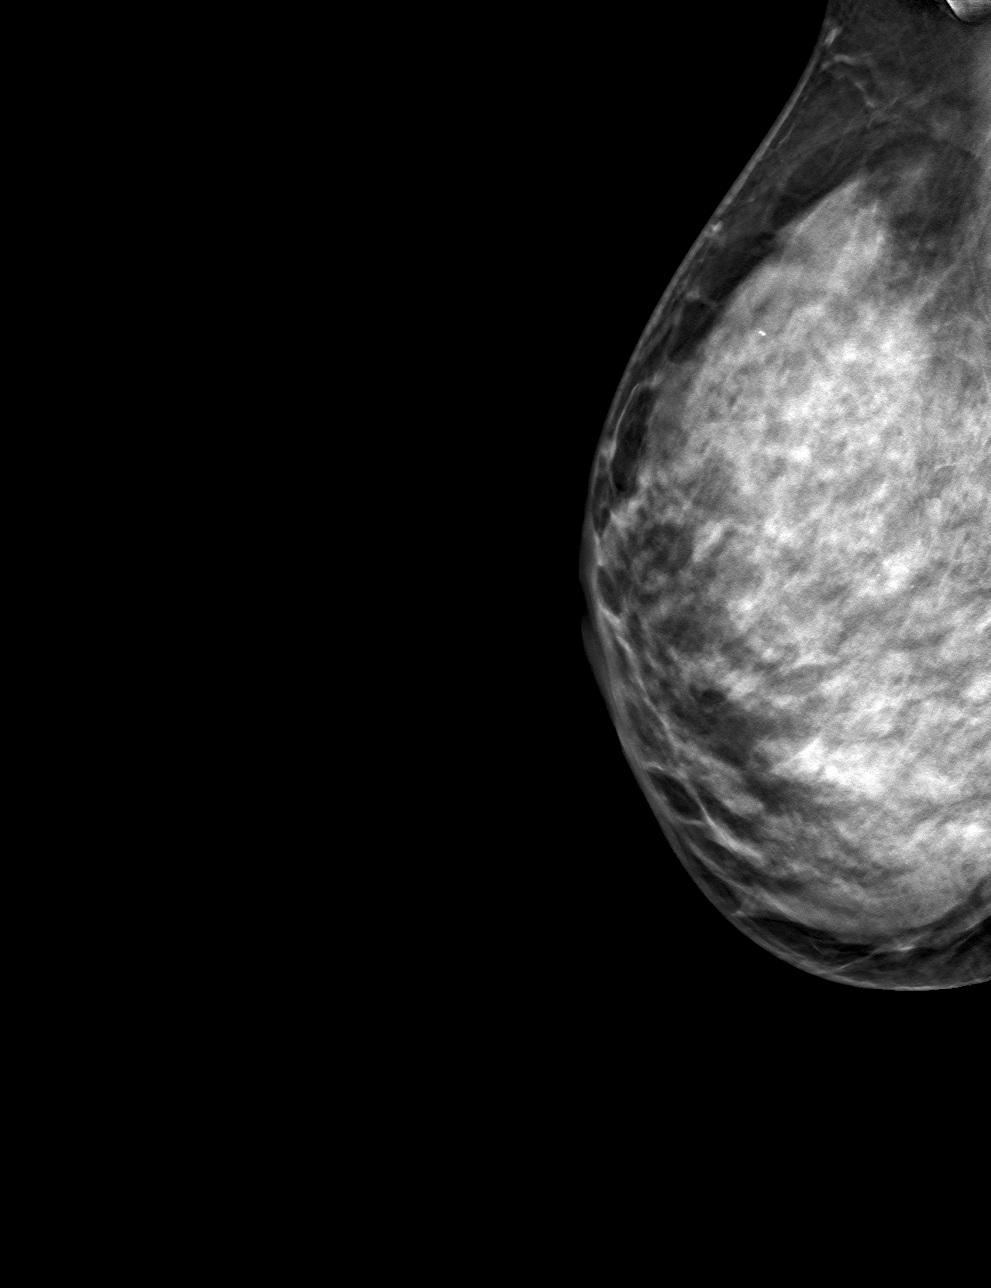

[4 of 12 positions shown; findings below may reference images not displayed]

ACR Breast Density Category c: The breast tissue is heterogeneously
dense, which may obscure small masses.
FINDINGS: Previously described, possible asymmetry in the medial right breast
at mid depth is seen on CC projection only resolves into well
dispersed fibroglandular tissue on today's additional views. No
suspicious findings are identified.
IMPRESSION: No mammographic evidence of malignancy.

RECOMMENDATION:
Screening mammogram in one year.(Code:[0V])

I have discussed the findings and recommendations with the patient.
If applicable, a reminder letter will be sent to the patient
regarding the next appointment.

BI-RADS CATEGORY  1: Negative.

## 2021-03-12 ENCOUNTER — Telehealth: Payer: Self-pay | Admitting: Emergency Medicine

## 2021-03-12 ENCOUNTER — Telehealth: Payer: Self-pay

## 2021-03-12 ENCOUNTER — Encounter: Payer: Self-pay | Admitting: Obstetrics and Gynecology

## 2021-03-12 ENCOUNTER — Encounter: Payer: Self-pay | Admitting: Hematology

## 2021-03-12 ENCOUNTER — Ambulatory Visit (INDEPENDENT_AMBULATORY_CARE_PROVIDER_SITE_OTHER): Payer: Medicaid Other | Admitting: Obstetrics and Gynecology

## 2021-03-12 VITALS — BP 104/67 | HR 58 | Wt 152.0 lb

## 2021-03-12 DIAGNOSIS — N83202 Unspecified ovarian cyst, left side: Secondary | ICD-10-CM | POA: Diagnosis not present

## 2021-03-12 DIAGNOSIS — Z712 Person consulting for explanation of examination or test findings: Secondary | ICD-10-CM

## 2021-03-12 NOTE — Progress Notes (Signed)
40 yo P1 presenting today for second opinion. Patient was diagnosed with a fibroid uterus and left ovarian cyst. Patient with recent diagnosis of colorectal cancer and is now concerned that she has additional cancers on her ovaries and uterus. Patient also reports onset of heavier period than normal. She was prescribed the nuvaring on 02/28/21 and inserted it for 2 weeks and removed it at the onset of her cycle a few days ago. Patient is without any other complaints. Patient was very emotional during her visit as she fears additional cancer diagnosis or need for surgery  Past Medical History:  Diagnosis Date   BV (bacterial vaginosis)    Cancer ([place])    Family history of bladder cancer    Lactose intolerance 03/12/2020   UTI (lower urinary tract infection)    Past Surgical History:  Procedure Laterality Date   CESAREAN SECTION     CYSTOSCOPY WITH STENT PLACEMENT  04/04/2020   Procedure: [place];  Surgeon: [name], MD;  Location: [facility];  Service: General;;   LAPAROSCOPIC LIVER ULTRASOUND N/A 12/13/2020   Procedure: INTRAOPERATIVE LIVER ULTRASOUND;  Surgeon: [name], MD;  Location: [facility];  Service: General;  Laterality: N/A;   LAPAROSCOPY N/A 12/13/2020   Procedure: LAPAROSCOPY DIAGNOSTIC;  Surgeon: [name], MD;  Location: [facility];  Service: General;  Laterality: N/A;   LAPAROTOMY N/A 04/04/2020   Procedure: EXPLORATORY LAPAROTOMY;  Surgeon: [name], MD;  Location: [facility];  Service: General;  Laterality: N/A;   OPEN PARTIAL HEPATECTOMY  N/A 12/13/2020   Procedure: OPEN PARTIAL HEPATECTOMY;  Surgeon: [name], MD;  Location: [facility];  Service: General;  Laterality: N/A;  ROOM 2 STARTING AT 09:30AM FOR 300 MIN   PORTACATH PLACEMENT Right 04/12/2020   Procedure: INSERTION PORT-A-CATH WITH ULTRASOUND;  Surgeon: Kinsinger, Arta Bruce, MD;  Location: [facility];  Service: General;  Laterality: Right;   Family History  Problem Relation Age of Onset   Heart  disease Father    Stroke Father    Aneurysm Father    Bladder Cancer Maternal Grandmother        dx 90s   Colon cancer Neg Hx    Esophageal cancer Neg Hx    Social History   Tobacco Use   Smoking status: Former    Packs/day: 0.50    Years: 10.00    Pack years: 5.00    Types: Cigarettes   Smokeless tobacco: Never  Vaping Use   Vaping Use: Never used  Substance Use Topics   Alcohol use: Yes    Comment: seldom   Drug use: No   ROS See pertinent in HPI. All other systems reviewed and non contributory Blood pressure 104/67, pulse (!) 58, weight 152 lb (68.9 kg), last menstrual period 02/12/2021. GENERAL: Well-developed, well-nourished female in no acute distress.  LUNGS: Clear to auscultation bilaterally.  HEART: Regular rate and rhythm. ABDOMEN: Soft, nontender, nondistended. No organomegaly. EXTREMITIES: No cyanosis, clubbing, or edema, 2+ distal pulses.    CT Abdomen Pelvis Wo Contrast  Result Date: 02/19/2021 CLINICAL DATA:  Status post partial liver resection now with persistent abdominal pain and abdominal distension. EXAM: CT ABDOMEN AND PELVIS WITHOUT CONTRAST TECHNIQUE: Multidetector CT imaging of the abdomen and pelvis was performed following the standard protocol without IV contrast. COMPARISON:  12/30/2020 FINDINGS: Lower chest: No acute abnormality. Hepatobiliary: Surgical changes of partial right hepatic resection are again identified. While not optimally assessed in absence of contrast administration, the previously noted perihepatic fluid collection adjacent  to the surgical margin has improved, measuring roughly 2.1 x 5.4 cm on axial image # 18/2. Previously noted subdiaphragmatic gas and fluid beneath the right hemidiaphragm has resolved. Surgical drainage catheter within this region has been removed. The liver is otherwise unremarkable. Cholecystectomy has been performed. Pancreas: Unremarkable Spleen: Unremarkable Adrenals/Urinary Tract: Adrenal glands are  unremarkable. Kidneys are normal, without renal calculi, focal lesion, or hydronephrosis. Bladder is unremarkable. Stomach/Bowel: There is fluid distension of the a first and second portion of the duodenum to the level of the SMA hiatus which appears narrow measuring 6 mm in dimension. This is similar to that seen on multiple prior examinations and the stomach, however, is decompressed, arguing against a obstruction secondary to extrinsic compression (SMA syndrome). 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 There is moderate stool throughout the colon. No free intraperitoneal gas or fluid. The small bowel is unremarkable. The appendix is normal. Vascular/Lymphatic: No significant vascular findings are present. No enlarged abdominal or pelvic lymph nodes. Reproductive: Intrauterine device in expected position within the uterine cavity. The pelvic organs are otherwise unremarkable. Other: Small broad-based umbilical hernia contains a single wall of the transverse colon. Musculoskeletal: No acute bone abnormality. No lytic or blastic bone lesion. IMPRESSION: Slightly limited examination examination in absence of contrast administration. Status post partial right hepatectomy. Interval decrease in size in perihepatic fluid collection and resolution of right subdiaphragmatic fluid and gas. No new intra-abdominal fluid collections are identified. 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 Moderate stool throughout the colon without evidence of obstruction. Fluid distension of the proximal duodenum to the level of the SMA hiatus which appears narrow. 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 Electronically Signed   By: Fidela Salisbury MD   On: 02/19/2021 23:11   DG Chest 2 View  Result Date: 12/30/2020 CLINICAL DATA:  Fever and  abdominal pain for 2 days. History of liver resection 2 weeks ago. Smoker. EXAM: CHEST - 2 VIEW COMPARISON:  12/20/2020 FINDINGS: Power port type central venous catheter with tip over the cavoatrial junction region. No pneumothorax. Heart size and pulmonary vascularity are normal. Mild atelectasis in the right base, improving since prior study. No developing airspace disease or consolidation. No pleural effusions. No pneumothorax. Small amount of free air under the right hemidiaphragm corresponds to an air-fluid level in a collection in the subdiaphragmatic space on the previous CT abdomen and pelvis from 12/19/2020. This likely represents a residual postoperative collection. A surgical drain is demonstrated in the right upper quadrant. Surgical clips in the right upper quadrant. IMPRESSION: Improving atelectasis in the right base. Small amount of free air under the right hemidiaphragm likely representing residual postoperative collection. Electronically Signed   By: Lucienne Capers M.D.   On: 12/30/2020 20:34   CT Abdomen Pelvis W Contrast  Result Date: 12/30/2020 CLINICAL DATA:  Fever and abdominal pain. History of liver resection 2 weeks ago. EXAM: CT ABDOMEN AND PELVIS WITH CONTRAST TECHNIQUE: Multidetector CT imaging of the abdomen and pelvis was performed using the standard protocol following bolus administration of intravenous contrast. CONTRAST:  176mL OMNIPAQUE IOHEXOL 300 MG/ML  SOLN COMPARISON:  December 19, 2020 FINDINGS: Lower chest: Mild atelectasis is seen within the right lung base. There is a small, stable right pleural effusion. Hepatobiliary: Postoperative changes are again seen along the posterior margin of the liver parenchyma consistent with recent partial resection of  the right lobe. An associated 7.7 cm x 2.7 cm posterior subcapsular gas and fluid collection is noted, with multiple adjacent surgical clips. This is very mildly decreased in size when compared to the prior study. A 2.5 cm x 2.3  cm well-defined cystic appearing area is also seen within this region, inferiorly (axial CT image 28, CT series number 2). This represents a new finding when compared to the prior study. A 9.6 cm x 2.0 cm subcapsular collection of gas and fluid is also seen along the anterior aspect of the right lobe of the liver. This is decreased in size when compared to the prior exam. An adjacent surgical drain is seen within the anteromedial supra hepatic space. Pancreas: Unremarkable. No pancreatic ductal dilatation or surrounding inflammatory changes. Spleen: Normal in size without focal abnormality. Adrenals/Urinary Tract: Adrenal glands are unremarkable. Kidneys are normal, without renal calculi, focal lesion, or hydronephrosis. Bladder is unremarkable. Stomach/Bowel: Stomach is within normal limits. Appendix appears normal. A left lower quadrant ostomy site is seen. No evidence of bowel wall thickening, distention, or inflammatory changes. Vascular/Lymphatic: No significant vascular findings are present. No enlarged abdominal or pelvic lymph nodes. Reproductive: An IUD is seen within the uterus. A stable 1.4 cm diameter uterine fibroid is seen. Small bilateral adnexal cysts are noted. Other: No pelvic free fluid is seen. Musculoskeletal: Degenerative changes are seen within the lower lumbar spine, at the level of L4-L5. IMPRESSION: 1. Postoperative changes consistent with recent partial resection of the right lobe of the liver with anterior and posterior subcapsular collections of gas and fluid. 2. Additional postoperative changes consistent with prior distal colectomy. 3. Small, stable right pleural effusion. 4. Small bilateral adnexal cysts, likely ovarian in origin. Electronically Signed   By: Virgina Norfolk M.D.   On: 12/30/2020 22:43   CT ABDOMEN PELVIS W CONTRAST  Result Date: 12/19/2020 CLINICAL DATA:  Abdominal abscess/infection suspected, post open partial hepatectomy on 12/13/2020 EXAM: CT ABDOMEN AND  PELVIS WITH CONTRAST TECHNIQUE: Multidetector CT imaging of the abdomen and pelvis was performed using the standard protocol following bolus administration of intravenous contrast. Sagittal and coronal MPR images reconstructed from axial data set. CONTRAST:  196mL OMNIPAQUE IOHEXOL 300 MG/ML SOLN IV. No oral contrast. COMPARISON:  08/05/2020 Correlation: MR abdomen and pelvis 11/09/2020 FINDINGS: Lower chest: RIGHT pleural effusion and basilar atelectasis Hepatobiliary: Partial resection of RIGHT lobe liver laterally with fluid and surgical clips at site with an irregular hepatic parenchymal margin. Large subcapsular gas and fluid collection at the anterior and anterolateral aspect of the liver measuring 11.6 x 5.4 x 9.5 cm, may represent an infected or sterile postoperative collection. Adjacent surgical drain in supra hepatic space. Small amount of fluid and gas adjacent to liver. No other hepatic abnormalities. Pancreas: Normal appearance Spleen: Normal appearance Adrenals/Urinary Tract: Adrenal glands, kidneys, ureters, and bladder normal appearance Stomach/Bowel: Normal appendix. Sigmoid colostomy LEFT pelvis with short Hartmann pouch. Food debris in stomach. Remaining bowel loops normal. Vascular/Lymphatic: Vascular structures patent.  No adenopathy Reproductive: IUD within uterus. Small anterior wall uterine nodule question leiomyoma 14 mm diameter. Unremarkable ovaries Other: Small amount of free fluid in pelvis. Minimal free air perihepatic. No additional fluid collections. Musculoskeletal: Unremarkable IMPRESSION: Post hepatic resection. Large subcapsular gas and fluid collection at the anterior and anterolateral aspect of the RIGHT lobe liver 11.6 x 5.4 x 9.5 cm, may represent an infected or sterile postoperative collection. Small amount of free intraperitoneal air and fluid adjacent to liver with a surgical drain in place. Sigmoid  colostomy with short Hartmann pouch. Small RIGHT pleural effusion and  basilar atelectasis. Small uterine leiomyoma 14 mm diameter. Electronically Signed   By: Lavonia Dana M.D.   On: 12/19/2020 14:22   DG CHEST PORT 1 VIEW  Result Date: 12/20/2020 CLINICAL DATA:  Shoulder pain EXAM: PORTABLE CHEST 1 VIEW COMPARISON:  April 04, 2020 FINDINGS: The heart size and mediastinal contours are within normal limits. A right-sided MediPort catheter tip seen at the superior cavoatrial junction. Both lungs are clear. The visualized skeletal structures are unremarkable. IMPRESSION: No active disease. Electronically Signed   By: Prudencio Pair M.D.   On: 12/20/2020 20:57   MM DIAG BREAST TOMO UNI RIGHT  Result Date: 03/11/2021 CLINICAL DATA:  40 year old female recalled from baseline screening mammogram dated 02/26/2021 for a possible right breast asymmetry. EXAM: DIGITAL DIAGNOSTIC UNILATERAL RIGHT MAMMOGRAM WITH TOMOSYNTHESIS AND CAD TECHNIQUE: Right digital diagnostic mammography and breast tomosynthesis was performed. The images were evaluated with computer-aided detection. COMPARISON:  Previous exam(s). ACR Breast Density Category c: The breast tissue is heterogeneously dense, which may obscure small masses. FINDINGS: Previously described, possible asymmetry in the medial right breast at mid depth is seen on CC projection only resolves into well dispersed fibroglandular tissue on today's additional views. No suspicious findings are identified. IMPRESSION: No mammographic evidence of malignancy. RECOMMENDATION: Screening mammogram in one year.(Code:SM-B-01Y) I have discussed the findings and recommendations with the patient. If applicable, a reminder letter will be sent to the patient regarding the next appointment. BI-RADS CATEGORY  1: Negative. Electronically Signed   By: Kristopher Oppenheim M.D.   On: 03/11/2021 09:37   MM 3D SCREEN BREAST BILATERAL  Result Date: 02/27/2021 CLINICAL DATA:  Screening. EXAM: DIGITAL SCREENING BILATERAL MAMMOGRAM WITH TOMOSYNTHESIS AND CAD TECHNIQUE: Bilateral  screening digital craniocaudal and mediolateral oblique mammograms were obtained. Bilateral screening digital breast tomosynthesis was performed. The images were evaluated with computer-aided detection. COMPARISON:  None. ACR Breast Density Category c: The breast tissue is heterogeneously dense, which may obscure small masses. FINDINGS: In the right breast, a possible asymmetry warrants further evaluation. This possible asymmetry is seen within the inner RIGHT breast, cc view only, slice 20. In the left breast, no findings suspicious for malignancy. IMPRESSION: Further evaluation is suggested for possible asymmetry in the right breast. RECOMMENDATION: Diagnostic mammogram and possibly ultrasound of the right breast. (Code:[id number]) The patient will be contacted regarding the findings, and additional imaging will be scheduled. BI-RADS CATEGORY  0: Incomplete. Need additional imaging evaluation and/or prior mammograms for comparison. Electronically Signed   By: Franki Cabot M.D.   On: 02/27/2021 15:48   US PELVIC COMPLETE WITH TRANSVAGINAL  Result Date: 02/13/2021 CLINICAL DATA:  Evaluate intrauterine device placement. EXAM: TRANSABDOMINAL AND TRANSVAGINAL ULTRASOUND OF PELVIS TECHNIQUE: Both transabdominal and transvaginal ultrasound examinations of the pelvis were performed. Transabdominal technique was performed for global imaging of the pelvis including uterus, ovaries, adnexal regions, and pelvic cul-de-sac. It was necessary to proceed with endovaginal exam following the transabdominal exam to visualize the endometrium. COMPARISON:  CT abdomen pelvis 12/30/2020 FINDINGS: Uterus Measurements: 9.4 x 4.3 x 4.5 cm = volume: 95.4 mL. Within the anterior right uterine fundus there is a 2.5 x 2.0 x 2.4 cm intramural fibroid. Endometrium Thickness: 4 mm. Intrauterine device is present within the endometrial cavity. The exact location is somewhat difficult to visualize given positioning of the uterus. Right ovary  Measurements: 3.1 x 2.9 x 1.9 cm = volume: 8.9 mL. Normal appearance/no adnexal mass. Left ovary Measurements: 5.4 x  3.6 x 4.0 cm = volume: 40.4 mL. There is a 2.0 x 3.5 x 3.6 cm complex cyst within the left ovary, potentially representing a hemorrhagic cyst. Other findings Small amount of free fluid in the pelvis. IMPRESSION: Complex cystic lesion left ovary, potentially representing a hemorrhagic cyst. Short-interval follow up ultrasound in 6-12 weeks is recommended, preferably during the week following the patient's normal menses. Intrauterine device is visualized within the endometrial canal. The exact location is difficult to discern given the positioning of the uterus. Electronically Signed   By: Lovey Newcomer M.D.   On: 02/13/2021 08:10   VAS [place] UPPER EXTREMITY VENOUS DUPLEX  Result Date: 12/21/2020 UPPER VENOUS STUDY  Indications: Pain Comparison Study: no prior Performing Technologist: Abram Sander RVS  Examination Guidelines: A complete evaluation includes B-mode imaging, spectral Doppler, color Doppler, and power Doppler as needed of all accessible portions of each vessel. Bilateral testing is considered an integral part of a complete examination. Limited examinations for reoccurring indications may be performed as noted.  Right Findings: +----------+------------+---------+-----------+----------+-------+ RIGHT     CompressiblePhasicitySpontaneousPropertiesSummary +----------+------------+---------+-----------+----------+-------+ IJV           Full       Yes       Yes                      +----------+------------+---------+-----------+----------+-------+ Subclavian    Full       Yes       Yes                      +----------+------------+---------+-----------+----------+-------+ Axillary      Full       Yes       Yes                      +----------+------------+---------+-----------+----------+-------+ Brachial      Full                                           +----------+------------+---------+-----------+----------+-------+ Radial        Full                                          +----------+------------+---------+-----------+----------+-------+ Ulnar         Full                                          +----------+------------+---------+-----------+----------+-------+ Cephalic      Full                                          +----------+------------+---------+-----------+----------+-------+ Basilic       Full                                          +----------+------------+---------+-----------+----------+-------+  Summary:  Right: No evidence of deep vein thrombosis in the upper extremity. No evidence of superficial vein thrombosis in the upper extremity. No evidence of thrombosis in the  subclavian.  *See table(s) above for measurements and observations.  Diagnosing physician: Jamelle Haring Electronically signed by Jamelle Haring on 12/21/2020 at 6:59:29 PM.    Final     A/P 40 yo with left hemorrhagic cyst - Reassurance provided regarding no evidence of gyn cancer - Discussed plan to follow up with ultrasound as scheduled for now - Reassurance regarding no need for GYN surgical interventions right now - Discussed proper usage of NuvaRing - RTC post ultrasound

## 2021-03-12 NOTE — Telephone Encounter (Signed)
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  Pt agreed to pick up drug at 0930 on Thursday (03/14/21) along with her booklets and mailing supplies.  Pt aware to call back before then with any questions or concerns.  [name] '[place], RN, BSN Clinical Research Nurse I 03/12/21 10:50 AM

## 2021-03-12 NOTE — Progress Notes (Signed)
GYN presents for FU  Last PAP 02/05/2021

## 2021-03-12 NOTE — Telephone Encounter (Signed)
Spoke with patient in regards to scheduling colonoscopy appt. Patient has been scheduled for a virtual pre-visit tomorrow at 1 pm. Colonoscopy is scheduled with Dr. Havery Moros on Monday, 03/18/21 at 8:30 am, arriving at 7:30 am with a care partner. Patient verbalized understanding of all information and had no concerns at the end of the call.   Lm on pre-visit RM 50 vm letting them know that patient was added on.

## 2021-03-12 NOTE — Telephone Encounter (Signed)
-----  Message from Steven P Armbruster, MD sent at 03/11/2021  2:33 PM EDT ----- Thanks Kimberly, either okay with me, I guess we can see what is open and who can get her in sooner. If you have openings this week that would be great.   [place], can you please contact this patient and book her for a colonoscopy with myself or Dr. Beavers, first available. If she can do this week with Dr. Beavers that would be great, I don't think I have any openings anytime soon unless cancellations I am not aware of. Either way okay with me. Thank you   ----- Message ----- From: Beavers, Kimberly, MD Sent: 03/11/2021   8:26 AM EDT To: Steven P Armbruster, MD  Steve,  You initially staffed this patient in 2018. I saw her last year for a one-time inpatient consultation. I would be happy to arrange for a colonoscopy if you prefer. I have availability later this week.  Kimberly ----- Message ----- From: Byerly, Faera, MD Sent: 03/08/2021   2:22 PM EDT To: Kimberly Beavers, MD  This lady was seen by you over a year ago for questionable diverticulitis.  I ended up operating on her due to no resolution last year and gave left sided colostomy due to the abscess and obstruction.  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  I just resected the liver met in march.  She never got a colonoscopy because of the abscess/microperf, etc.   We are considering colostomy takedown.  Can you get her set up for a colonoscopy in the next 3 months or so?  Tx FB     

## 2021-03-13 ENCOUNTER — Other Ambulatory Visit: Payer: Self-pay

## 2021-03-13 ENCOUNTER — Other Ambulatory Visit: Payer: Self-pay | Admitting: Hematology

## 2021-03-13 ENCOUNTER — Ambulatory Visit (AMBULATORY_SURGERY_CENTER): Payer: Medicaid Other | Admitting: *Deleted

## 2021-03-13 VITALS — Ht 69.0 in | Wt 150.0 lb

## 2021-03-13 DIAGNOSIS — C19 Malignant neoplasm of rectosigmoid junction: Secondary | ICD-10-CM

## 2021-03-13 MED ORDER — SUPREP BOWEL PREP KIT 17.5-3.13-1.6 GM/177ML PO SOLN: 1.0000 | Freq: Once | ORAL | 0 refills | Status: AC

## 2021-03-13 NOTE — Progress Notes (Signed)
Pt verified name, DOB, address and insurance during PV today. PV completed over the phone.  Pt encouraged to call with questions or issues.  My Chart instructions to pt as colon  Monday 6-20 and wont get mail in time   Pt verbalized understanding and will call with questions after prints off her My Chart  No egg or soy allergy known to patient  No issues with past sedation with any surgeries or procedures Patient denies ever being told they had issues or difficulty with intubation  No FH of Malignant Hyperthermia No diet pills per patient No home 02 use per patient  No blood thinners per patient  Pt denies issues with constipation  No A fib or A flutter  EMMI video to pt or via [place] 19 guidelines implemented in [place] today with Pt and RN  Pt is fully vaccinated  for Covid  Pt instructed to STOP iron starting today   Due to the COVID-19 pandemic we are asking patients to follow certain guidelines.  Pt aware of COVID protocols and [facility] guidelines

## 2021-03-14 ENCOUNTER — Inpatient Hospital Stay: Payer: Medicaid Other | Admitting: Emergency Medicine

## 2021-03-14 ENCOUNTER — Telehealth: Payer: Self-pay | Admitting: Nurse Practitioner

## 2021-03-14 ENCOUNTER — Encounter: Payer: Self-pay | Admitting: Nurse Practitioner

## 2021-03-14 DIAGNOSIS — C19 Malignant neoplasm of rectosigmoid junction: Secondary | ICD-10-CM

## 2021-03-14 DIAGNOSIS — F411 Generalized anxiety disorder: Secondary | ICD-10-CM

## 2021-03-14 MED ORDER — INV-PALMITOYLETHANOLAMIDE/PLACEBO 400 MG CAPS ACCRU-~~LOC~~-2102: ORAL_CAPSULE | ORAL | 0 refills | Status: DC

## 2021-03-14 MED ORDER — CLONAZEPAM 0.5 MG PO TABS: 0.5000 mg | ORAL_TABLET | Freq: Two times a day (BID) | ORAL | 0 refills | Status: DC | PRN

## 2021-03-14 NOTE — Telephone Encounter (Signed)
I called Stephanie [name] in response to her MyChart message about increasing anxiety. She feels debilitated by this at times, finding it hard to work or do normal daily functions. Attributes to where she is in cancer trajectory and upcoming surveillance colonoscopy and scans. Other days she does not have problems. She has a little depression from the frustration felt from the anxiety and when she gets overwhelmed. Denies SI/HI. She does not drink alcohol, but has thought this might help at times. She is interested in stronger medication (on ativan PRN) and referral to a therapist. She is seeing someone with her daughter, more of a family therapist, but thinks it would be helpful to see a separate therapist.   She tried celexa and zoloft in the past, did not like side effects. She doesn't want something has to take daily because some days she does not have an issue. Explained rationale for SSRI/SNRI's. She was on low dose klonopin in the past, which was helpful, and requests to go back on this. I will prescribe for 1 month and refer to PCP and BH to help manage anxiety. She agrees and appreciates assistance.   Cira Rue, NP

## 2021-03-14 NOTE — Research (Signed)
TRIAL: [facility]-[place]-2102 - TREATMENT OF ESTABLISHED CHEMOTHERAPY-INDUCED NEUROPATHY WITH N-PALMITOYLETHANOLAMIDE, A CANNABIMIMETIC NUTRACEUTICAL: A RANDOMIZED DOUBLE-BLIND PHASE II PILOT TRIAL   Patient arrives today Unaccompanied to pick up her weekly PRO booklets and study drug supply.    ADVERSE EVENTS: Patient KALENA MANDER reports baseline anxiety (grade 2) that is managed with oral ativan 0.5 mg taken as needed BID.  MEDICATION DIARIES and IRT: No drug diaries required per protocol.  Pt was provided with 4 bottles of N-Palmitoylethanolamide (PEA)/placebo study drug with 30 capsules in each (120 capsules total).  Pt instructed to take one pill in the morning with food daily and one pill in the evening with food daily for 8 weeks (for a total of 56 doses out of the allotted 60 doses provided to her today).  Pt instructed to start taking medication today.  Reviewed medication counts/info and patient info with PharmD Raul Del (designated unblinded pharmacist) who confirmed everything was correct. Pt was provided with weeks 1-8 of PRO booklets and pre-paid envelopes.  Patient was reminded to complete weekly booklets and mail them back to [employer] each week, and that this Research Nurse would be contacting her weekly by phone to f/u on completion and to assess for any issues/answer any questions. Pt verbalized understanding of all instructions and information and to call this Research Nurse as needed with any questions or concerns.  The patient was thanked for their time and continued voluntary participation in this study. Patient LAYANI FORONDA has been provided direct contact information and is encouraged to contact this Nurse for any needs or questions.  THIS CLINICAL RESEARCH NURSE WILL CONTACT THE PATIENT ON 03/20/2021 FOR HER FIRST WEEKLY PHONE CALL.  [name] '[place], RN, BSN Clinical Research Nurse I 03/14/21 9:58 AM

## 2021-03-18 ENCOUNTER — Encounter: Payer: Self-pay | Admitting: Hematology

## 2021-03-18 ENCOUNTER — Other Ambulatory Visit: Payer: Self-pay

## 2021-03-18 ENCOUNTER — Encounter: Payer: Self-pay | Admitting: Gastroenterology

## 2021-03-18 ENCOUNTER — Ambulatory Visit (AMBULATORY_SURGERY_CENTER): Payer: Medicaid Other | Admitting: Gastroenterology

## 2021-03-18 VITALS — BP 101/68 | HR 66 | Temp 96.4°F | Resp 13 | Ht 69.0 in | Wt 150.0 lb

## 2021-03-18 DIAGNOSIS — K635 Polyp of colon: Secondary | ICD-10-CM

## 2021-03-18 DIAGNOSIS — D123 Benign neoplasm of transverse colon: Secondary | ICD-10-CM

## 2021-03-18 DIAGNOSIS — C19 Malignant neoplasm of rectosigmoid junction: Secondary | ICD-10-CM | POA: Diagnosis not present

## 2021-03-18 MED ORDER — SODIUM CHLORIDE 0.9 % IV SOLN: 500.0000 mL | Freq: Once | INTRAVENOUS | Status: DC

## 2021-03-18 NOTE — Progress Notes (Signed)
VS taken by [place] 

## 2021-03-18 NOTE — Patient Instructions (Signed)
YOU HAD AN ENDOSCOPIC PROCEDURE TODAY AT THE [facility]:   Refer to the procedure report that was given to you for any specific questions about what was found during the examination.  If the procedure report does not answer your questions, please call your gastroenterologist to clarify.  If you requested that your care partner not be given the details of your procedure findings, then the procedure report has been included in a sealed envelope for you to review at your convenience later.  YOU SHOULD EXPECT: Some feelings of bloating in the abdomen. Passage of more gas than usual.  Walking can help get rid of the air that was put into your GI tract during the procedure and reduce the bloating. If you had a lower endoscopy (such as a colonoscopy or flexible sigmoidoscopy) you may notice spotting of blood in your stool or on the toilet paper. If you underwent a bowel prep for your procedure, you may not have a normal bowel movement for a few days.  Please Note:  You might notice some irritation and congestion in your nose or some drainage.  This is from the oxygen used during your procedure.  There is no need for concern and it should clear up in a day or so.  SYMPTOMS TO REPORT IMMEDIATELY:   Following lower endoscopy (colonoscopy or flexible sigmoidoscopy):  Excessive amounts of blood in the stool  Significant tenderness or worsening of abdominal pains  Swelling of the abdomen that is new, acute  Fever of 100F or higher   Following upper endoscopy (EGD)  Vomiting of blood or coffee ground material  New chest pain or pain under the shoulder blades  Painful or persistently difficult swallowing  New shortness of breath  Fever of 100F or higher  Black, tarry-looking stools  For urgent or emergent issues, a gastroenterologist can be reached at any hour by calling [phone]. Do not use MyChart messaging for urgent concerns.    DIET:  We do recommend a small meal at first, but  then you may proceed to your regular diet.  Drink plenty of fluids but you should avoid alcoholic beverages for 24 hours.  ACTIVITY:  You should plan to take it easy for the rest of today and you should NOT DRIVE or use heavy machinery until tomorrow (because of the sedation medicines used during the test).    FOLLOW UP: Our staff will call the number listed on your records 48-72 hours following your procedure to check on you and address any questions or concerns that you may have regarding the information given to you following your procedure. If we do not reach you, we will leave a message.  We will attempt to reach you two times.  During this call, we will ask if you have developed any symptoms of COVID 19. If you develop any symptoms (ie: fever, flu-like symptoms, shortness of breath, cough etc.) before then, please call [phone].  If you test positive for Covid 19 in the 2 weeks post procedure, please call and report this information to us.    If any biopsies were taken you will be contacted by phone or by letter within the next 1-3 weeks.  Please call us at [phone] if you have not heard about the biopsies in 3 weeks.    SIGNATURES/CONFIDENTIALITY: You and/or your care partner have signed paperwork which will be entered into your electronic medical record.  These signatures attest to the fact that that the information above on   your After Visit Summary has been reviewed and is understood.  Full responsibility of the confidentiality of this discharge information lies with you and/or your care-partner. 

## 2021-03-18 NOTE — Progress Notes (Signed)
Pt's states no medical or surgical changes since previsit or office visit.  [facility] [place] 

## 2021-03-18 NOTE — Progress Notes (Signed)
A/ox3, pleased with MAC, report to RN 

## 2021-03-18 NOTE — Op Note (Addendum)
[facility] Patient Name: Stephanie [name] Procedure Date: 03/18/2021 8:45 AM MRN: [id number] Endoscopist: Remo Lipps P. Havery Moros , MD Age: 40 Referring MD:  Date of Birth: [birth year] Gender: Female Account #: [id number] Procedure:                Colonoscopy Indications:              High risk colon cancer surveillance: Personal                            history of colon cancer - rectosigmoid, s/p                            surgical resection, chemotherapy, first colonoscopy Medicines:                Monitored Anesthesia Care Procedure:                After obtaining informed consent, the colonoscope                            was passed under direct vision. Throughout the                            procedure, the patient's blood pressure, pulse, and                            oxygen saturations were monitored continuously. The                            Olympus [id number]-[id number] (#[id number]) Colonoscope was                            introduced through the sigmoid colostomy and                            advanced to the the cecum, identified by                            appendiceal orifice and ileocecal valve. The                            colonoscopy was performed without difficulty. The                            patient tolerated the procedure well. The quality                            of the bowel preparation was adequate. The                            ileocecal valve and the appendiceal orifice were                            photographed. Scope In: 8:54:25 AM Scope Out: 9:18:23 AM Scope Withdrawal Time: 0 hours 17 minutes 30 seconds  Total Procedure Duration: 0 hours 23 minutes 58  seconds  Findings:                 The perianal and digital rectal examinations were                            normal.                           The rectal pouch appeared normal but there was                            significant retained mucous which prohibited some                             views but nothing obvious noted in the pouch.                           A 4 to 5 mm polyp was found in the transverse                            colon. The polyp was flat. The polyp was removed                            with a cold snare. Resection and retrieval were                            complete.                           The exam was otherwise without abnormality. Of                            note, the prep was adequate but several minutes                            spent lavaging the colon to achieve adequate views. Complications:            No immediate complications. Estimated blood loss:                            Minimal. Estimated Blood Loss:     Estimated blood loss was minimal. Impression:               - The rectal pouch appeared normal but some limited                            views due to retained mucous.                           - One 4 to 5 mm polyp in the transverse colon,                            removed with a cold snare. Resected and retrieved.                           -  The examination was otherwise normal. Recommendation:           - Patient has a contact number available for                            emergencies. The signs and symptoms of potential                            delayed complications were discussed with the                            patient. Return to normal activities tomorrow.                            Written discharge instructions were provided to the                            patient.                           - Resume previous diet.                           - Continue present medications.                           - Await pathology results.                           - Okay to follow up with surgery to discuss                            possible colostomy takedown Remo Lipps P. Havery Moros, MD 03/18/2021 9:25:45 AM This report has been signed electronically.

## 2021-03-18 NOTE — Progress Notes (Signed)
Called to room to assist during endoscopic procedure.  Patient ID and intended procedure confirmed with present staff. Received instructions for my participation in the procedure from the performing physician.  

## 2021-03-18 NOTE — Progress Notes (Signed)
Pt's states no medical or surgical changes since previsit or office visit. 

## 2021-03-20 ENCOUNTER — Ambulatory Visit (INDEPENDENT_AMBULATORY_CARE_PROVIDER_SITE_OTHER): Payer: Medicaid Other

## 2021-03-20 ENCOUNTER — Telehealth: Payer: Self-pay

## 2021-03-20 ENCOUNTER — Other Ambulatory Visit: Payer: Self-pay

## 2021-03-20 ENCOUNTER — Other Ambulatory Visit: Payer: Medicaid Other

## 2021-03-20 ENCOUNTER — Telehealth: Payer: Self-pay | Admitting: Emergency Medicine

## 2021-03-20 DIAGNOSIS — R300 Dysuria: Secondary | ICD-10-CM

## 2021-03-20 LAB — POCT URINALYSIS DIPSTICK (MANUAL)
Nitrite, UA: POSITIVE — AB
Poct Bilirubin: NEGATIVE
Poct Blood: 50 — AB
Poct Glucose: NORMAL mg/dL
Poct Protein: NEGATIVE mg/dL
Poct Urobilinogen: NORMAL mg/dL
Spec Grav, UA: 1.015 (ref 1.010–1.025)
pH, UA: 6.5 (ref 5.0–8.0)

## 2021-03-20 MED ORDER — NITROFURANTOIN MONOHYD MACRO 100 MG PO CAPS
100.0000 mg | ORAL_CAPSULE | Freq: Two times a day (BID) | ORAL | 0 refills | Status: DC
Start: 2021-03-20 — End: 2021-03-25

## 2021-03-20 NOTE — Research (Signed)
[facility]U-[place]-2102 - TREATMENT OF ESTABLISHED CHEMOTHERAPY-INDUCED NEUROPATHY WITH N-PALMITOYLETHANOLAMIDE, A CANNABIMIMETIC NUTRACEUTICAL: A RANDOMIZED DOUBLE-BLIND PHASE II PILOT TRIAL  LATE ENTRY- STRATIFICATION CRITERIA WERE CONFIRMED ON 03/07/2021 BY THIS CLINICAL RESEARCH NURSE AND CLINICAL RESEARCH NURSE [facility] BUT WERE NOT ENTERED IN VISIT NOTE.  Both Clinical Research Nurses verified the stratification factors used for randomization (female vs female, taxane + carboplatin + other agents vs oxaliplatin vs other (neither received a taxane or oxaliplatin). As part of the assigned treatment arm, patient will receive study agent 2 time(s) daily. MD will be notified of patient assignment; treatment will begin once study drug has been received.  Per study protocol, patient will not be notified of their treatment assignment. Patient is successfully enrolled in the above study.  [name] '[place], RN, BSN Clinical Research Nurse I 03/20/21 2:48 PM

## 2021-03-20 NOTE — Progress Notes (Signed)
SUBJECTIVE: Stephanie [name] is a 40 y.o. female who complains of urinary frequency, urgency and dysuria x 3 days, without flank pain, fever, chills, or abnormal vaginal discharge or bleeding.   OBJECTIVE: Appears well, in no apparent distress.  Vital signs are normal. Urine dipstick shows positive for nitrates and leuk.    ASSESSMENT: Dysuria  PLAN: Treatment per orders.  Call or return to clinic prn if these symptoms worsen or fail to improve as anticipated. Will send in medication per protocol. Patient has been advised she may have to switch antibiotic due to the sensitivity of type of UTI.

## 2021-03-20 NOTE — Telephone Encounter (Cosign Needed)
[facility]U-[place]-2102 - TREATMENT OF ESTABLISHED CHEMOTHERAPY-INDUCED NEUROPATHY WITH N-PALMITOYLETHANOLAMIDE, A CANNABIMIMETIC NUTRACEUTICAL: A RANDOMIZED DOUBLE-BLIND PHASE II PILOT TRIAL   WEEK 1 PHONE CALL  Patient was contacted via phone today (03/20/2021) to complete study week 1 phone call. Identity was confirmed using two patient identifiers.    QUESTIONNAIRE: Patient has not completed their weekly questionnaire and has not returned the completed form using the provided envelope. Patient was encouraged to complete the study questionnaire for the week and return completed form in the provided envelope.  Pt states she will finish it today and mail it out tomorrow.  INVESTIGATIONAL PRODUCT: Patient reports taking 13 capsules since 03/14/2021.  Pt states as of time of call today she has not taken her evening dose scheduled for today but did take her morning dose. Patient is Always compliant with study medication. Patient is re-educated on the importance of taking study medication as directed (twice daily once in the morning with food and once in the evening with food).  Patient does verbalize understanding.   ADVERSE EVENTS: Patient Denies adverse effects.  Per protocol (Appendix VIII) pt was asked about nausea, vomiting, and diarrhea.  Pt denies all three. Patient reports baseline anxiety (grade 2) that is managed with oral ativan 0.5 mg taken as needed BID, denies any changes from baseline.  FOLLOW-UP: Patient Denies concerns at this time.  Patients agrees to next follow-up phone call on 03/27/2021 at any time during the day.   Patient denies any changes in her medications or other treatments (per protocol, Appendix VIII).  Patient denies taking any other cannabinoid products like CBD and/or THC, was re-educated on importance of not taking them while on study.  Pt verbalizes understanding of instructions.   The patient was thanked for their time and continued voluntary participation in this study.  Patient has been provided direct contact information and is encouraged to contact this Nurse for any needs or questions.  [name] '[place], RN, BSN Clinical Research Nurse I 03/20/21 2:37 PM

## 2021-03-20 NOTE — Telephone Encounter (Signed)
  Follow up Call-  Call back number 03/18/2021  Post procedure Call Back phone  # [phone]  Permission to leave phone message Yes  Some recent data might be hidden     Patient questions:  Do you have a fever, pain , or abdominal swelling? No. Pain Score  0 *  Have you tolerated food without any problems? Yes.    Have you been able to return to your normal activities? Yes.    Do you have any questions about your discharge instructions: Diet   No. Medications  No. Follow up visit  No.  Do you have questions or concerns about your Care? No.  Actions: * If pain score is 4 or above: No action needed, pain <4.  Have you developed a fever since your procedure? no  2.   Have you had an respiratory symptoms (SOB or cough) since your procedure? no  3.   Have you tested positive for COVID 19 since your procedure no  4.   Have you had any family members/close contacts diagnosed with the COVID 19 since your procedure?  no   If yes to any of these questions please route to Joylene John, RN and Joella Prince, RN

## 2021-03-21 ENCOUNTER — Telehealth: Payer: Self-pay | Admitting: Emergency Medicine

## 2021-03-21 NOTE — Telephone Encounter (Addendum)
[facility]CRU-[place]-2102 - TREATMENT OF ESTABLISHED CHEMOTHERAPY-INDUCED NEUROPATHY WITH N-PALMITOYLETHANOLAMIDE, A CANNABIMIMETIC NUTRACEUTICAL: A RANDOMIZED DOUBLE-BLIND PHASE II PILOT TRIAL  F/U PHONE CALL (FROM WEEK 1 CALL) TO UPDATE MEDICATION LIST  1:30 pm- Attempted to contact pt to f/u on medication list changes in her chart.  No answer.  Will attempt again later.  1:45 pm- Spoke with patient and verified/updated current medication list.  Pt states that she stopped taking Ativan when she was prescribed Klonopin by NP [name] for anxiety, reports improved anxiety since then.  Pt also states she started taking an antibiotic (macrobid) for UTI symptoms on 03/20/21 - however pt states that these symptoms pre-date starting study drug and that she gets UTIs very often.  Denies any other changes in medications or allergies or treatments.  Pt reports that she completed the study Week 1 questionnaire and mailed it out today.  Pt denies any questions/concerns at this time and is aware to expect call next Wednesday from Research Nurse.  [name] '[place], RN, BSN Clinical Research Nurse I 03/21/21 1:51 PM

## 2021-03-24 LAB — URINE CULTURE

## 2021-03-25 ENCOUNTER — Telehealth: Payer: Self-pay

## 2021-03-25 ENCOUNTER — Other Ambulatory Visit: Payer: Self-pay

## 2021-03-25 MED ORDER — SULFAMETHOXAZOLE-TRIMETHOPRIM 800-160 MG PO TABS: 1.0000 | ORAL_TABLET | Freq: Two times a day (BID) | ORAL | 0 refills | Status: DC

## 2021-03-25 NOTE — Telephone Encounter (Signed)
Patient has been informed of test results and change in medication. She is requesting something for BV. I have advised her to wait a couple of days after starting the bactrim to see she has any relief. Patient voice understanding and will follow up.

## 2021-03-25 NOTE — Telephone Encounter (Signed)
-----   Message from Griffin Basil, MD sent at 03/25/2021  8:29 AM EDT ----- UTI confirmed from culture, pt treated with macrobid, organism is resistant, need to switch to bactrim DS for 5 days

## 2021-03-25 NOTE — Telephone Encounter (Signed)
Call patient to inform her of test results. No answer or voice mail to leave a message.

## 2021-03-27 ENCOUNTER — Other Ambulatory Visit: Payer: Self-pay

## 2021-03-27 ENCOUNTER — Other Ambulatory Visit: Payer: Self-pay | Admitting: Nurse Practitioner

## 2021-03-27 ENCOUNTER — Telehealth: Payer: Self-pay | Admitting: Emergency Medicine

## 2021-03-27 DIAGNOSIS — F411 Generalized anxiety disorder: Secondary | ICD-10-CM

## 2021-03-27 DIAGNOSIS — C19 Malignant neoplasm of rectosigmoid junction: Secondary | ICD-10-CM

## 2021-03-27 MED ORDER — METRONIDAZOLE 500 MG PO TABS: 500.0000 mg | ORAL_TABLET | Freq: Two times a day (BID) | ORAL | 0 refills | Status: DC

## 2021-03-27 MED ORDER — CLONAZEPAM 0.5 MG PO TABS
0.5000 mg | ORAL_TABLET | Freq: Two times a day (BID) | ORAL | 0 refills | Status: DC | PRN
Start: 2021-03-27 — End: 2021-04-11

## 2021-03-27 NOTE — Telephone Encounter (Cosign Needed)
[facility]CRU-[place]-2102 - TREATMENT OF ESTABLISHED CHEMOTHERAPY-INDUCED NEUROPATHY WITH N-PALMITOYLETHANOLAMIDE, A CANNABIMIMETIC NUTRACEUTICAL: A RANDOMIZED DOUBLE-BLIND PHASE II PILOT TRIAL   WEEK 2 PHONE CALL   Patient was contacted via phone today (03/27/2021) to complete study week 2 phone call. Identity was confirmed using two patient identifiers.              QUESTIONNAIRE: Patient has completed their weekly questionnaire and has returned the completed form using the provided envelope, states she sent it out today.   INVESTIGATIONAL PRODUCT: Patient reports taking 13 capsules since 03/20/2021.  Pt states as of time of call today she has not taken her evening dose scheduled for today but did take her morning dose. Patient is Always compliant with study medication. Patient is re-educated on the importance of taking study medication as directed (twice daily once in the morning with food and once in the evening with food).  Patient does verbalize understanding.   ADVERSE EVENTS: Patient Denies adverse effects.  Per protocol (Appendix VIII) pt was asked about nausea, vomiting, and diarrhea.  Pt denies all three. Patient reports baseline anxiety (grade 2) that is managed with oral Klonopin 0.5 mg taken as needed BID, denies any changes from baseline.  See 03/21/21 Research RN telephone note for information about switching prescriptions from Ativan to Klonopin. Patient reports ongoing baseline UTI (symptoms of dysuria & polyuria) (Grade 2) that she states started before her study enrollment (see 03/21/2021 Research RN telephone note for more info).  States that symptoms have started improving with oral antibiotic administration from her OBGYN.   FOLLOW-UP: Patient Denies concerns at this time.  Patients agrees to next follow-up phone call on 04/03/2021 at any time during the day.   MEDICATIONS: Patient reports stopping her clove oil supplement today that she takes PRN.  She also reports that her UTI symptoms  are still present but are being managed with antibiotics by her OBGYN, and that they are improving as of today.  States that she stopped taking Macrobid on 03/25/21 and started new prescription of Bactrim the same day per her OBGYN's orders. Pt denies any other changes in her medications or other treatments (per protocol, Appendix VIII).  Medication list updated.  No new allergies per pt.  Patient denies taking any other cannabinoid products like CBD and/or THC, was re-educated on importance of not taking them while on study.  Pt verbalizes understanding of instructions.     The patient was thanked for their time and continued voluntary participation in this study. Patient has been provided direct contact information and is encouraged to contact this Nurse for any needs or questions.   [name] '[place], RN, BSN Clinical Research Nurse I 03/27/21 12:33 PM

## 2021-03-27 NOTE — Telephone Encounter (Signed)
Patient is requesting a rx for BV. She thinks she has BV followed by the UTI. Will call in one dose, patient should wait to finish the bactrim to avoid any upset stomach. Patient voices understanding.

## 2021-03-27 NOTE — Research (Signed)
[facility]U--2102 - TREATMENT OF ESTABLISHED CHEMOTHERAPY-INDUCED NEUROPATHY WITH N-PALMITOYLETHANOLAMIDE, A CANNABIMIMETIC NUTRACEUTICAL: A RANDOMIZED DOUBLE-BLIND PHASE II PILOT TRIAL  LATE ENTRY TO ELIGIBILITY CHECK NOTE  Pt denied verbally during her 03/07/2021 visit with this Clinical Research Nurse: - Concurrent or planned use of opioids.  [name] '[place], RN, BSN Clinical Research Nurse I 03/27/21 2:28 PM

## 2021-03-29 ENCOUNTER — Encounter: Payer: Self-pay | Admitting: Emergency Medicine

## 2021-03-29 DIAGNOSIS — C19 Malignant neoplasm of rectosigmoid junction: Secondary | ICD-10-CM

## 2021-03-29 NOTE — Research (Signed)
[facility]CRU-[place]-2102 - TREATMENT OF ESTABLISHED CHEMOTHERAPY-INDUCED NEUROPATHY WITH N-PALMITOYLETHANOLAMIDE, A CANNABIMIMETIC NUTRACEUTICAL: A RANDOMIZED DOUBLE-BLIND PHASE II PILOT TRIAL  Received call from patient at 12:07 pm today (03/29/2021) reporting that she has been feeling increasingly forgetful today and yesterday, with one episode of mild dizziness (Grade 1) yesterday evening, and feeling ongoing fatigue (Grade 2) today and yesterday.  Pt states that she stopped taking her PEA/Placebo study drug yesterday evening and this morning (missing 2 doses total) as she was concerned that it was causing her symptoms.  Pt reports that she has finished her antibiotic for her UTI (Grade 2) and that those symptoms have resolved.  Pt states that she stopped taking her Klonopin on 03/26/21 completely, as her ongoing baseline anxiety (Grade 2) had improved after recently receiving an all-clear from her last cancer screening scan (Grade 1), and she states she no longer wanted to be dependent on anxiety medication (which she was taking daily).  Patient denies any other changes in her medications/allergies or any other symptoms.  Advised pt that I would speak with MD Burr Medico and return her call immediately for further advisement.  Spoke with MD Burr Medico who has ruled these symptoms to be unrelated to the study in any way, and that are likely from patient completely stopping her daily Klonopin dose without proper downward titration.  MD advises that pt restart her Klonopin at a half dose daily for 2-3 days, then taper down to a half dose every other day, and so forth until she can stop medication completely in about 2 weeks.  Pt was re-contacted and advised on MD Feng's recommendations.  Pt agreed to tx plan and states she will restart the Klonopin (as she still has the prescription available) today.  Pt advised to restart study drug (PEA/placebo) this evening and to continue monitoring for changes, and to rest to improve fatigue.  Pt  verbalized understanding of all instructions and to contact MD Feng/Research with any further questions, concerns, or changes or worsening/new symptoms.   ADVERSE EVENT LOG: USING CTCAE V 5.0 Stephanie [name] [id number]  03/29/2021  Adverse Event Log  Study/Protocol: [facility] [place] 2102 Cycle: Between Week 2 & 3  Pt reports mild forgetfulness today & yesterday, however this AE is not mentioned in CTCAE or study protocol as relevant or gradeable, reporting and intervention not required.   Event Grade Onset Date Resolved Date Drug Name Attribution Treatment Comments  Anxiety Grade 2 02/27/2010 Baseline 03/26/2021 PEA/Placebo Unrelated Klonopin   Anxiety Grade 1 03/26/21 Ongoing (Baseline condition) PEA/Placebo Unrelated    Dizziness Grade 1 03/28/21 03/28/21 PEA/Placebo Unrelated    Fatigue Grade 2 03/28/21 Ongoing PEA/Placebo Unrelated Rest   UTI Grade 2 03/13/21 Baseline Resolved 03/29/21 PEA/Placebo Unrelated Oral antibiotic    Per protocol no reporting or action required for all above AEs, including forgetfulness, dizziness, and fatigue which are all ruled as unrelated to study per MD Burr Medico.  [name] '[place], RN, BSN Clinical Research Nurse I 03/29/21 1:30 PM

## 2021-04-02 ENCOUNTER — Encounter: Payer: Self-pay | Admitting: Hematology

## 2021-04-03 ENCOUNTER — Other Ambulatory Visit: Payer: Self-pay

## 2021-04-03 ENCOUNTER — Encounter (HOSPITAL_COMMUNITY): Payer: Self-pay | Admitting: *Deleted

## 2021-04-03 ENCOUNTER — Ambulatory Visit (HOSPITAL_COMMUNITY)
Admission: EM | Admit: 2021-04-03 | Discharge: 2021-04-03 | Disposition: A | Discharge status: Home / Self Care | Payer: Medicaid Other | Attending: Student | Admitting: Student

## 2021-04-03 ENCOUNTER — Encounter: Payer: Self-pay | Admitting: Emergency Medicine

## 2021-04-03 DIAGNOSIS — N760 Acute vaginitis: Secondary | ICD-10-CM | POA: Insufficient documentation

## 2021-04-03 DIAGNOSIS — C19 Malignant neoplasm of rectosigmoid junction: Secondary | ICD-10-CM

## 2021-04-03 DIAGNOSIS — Z113 Encounter for screening for infections with a predominantly sexual mode of transmission: Secondary | ICD-10-CM | POA: Diagnosis present

## 2021-04-03 DIAGNOSIS — Z85038 Personal history of other malignant neoplasm of large intestine: Secondary | ICD-10-CM | POA: Diagnosis present

## 2021-04-03 DIAGNOSIS — Z87440 Personal history of urinary (tract) infections: Secondary | ICD-10-CM | POA: Diagnosis present

## 2021-04-03 LAB — POCT URINALYSIS DIPSTICK, ED / UC
Bilirubin Urine: NEGATIVE
Glucose, UA: NEGATIVE mg/dL
Ketones, ur: NEGATIVE mg/dL
Nitrite: NEGATIVE
Protein, ur: NEGATIVE mg/dL
Specific Gravity, Urine: 1.01 (ref 1.005–1.030)
Urobilinogen, UA: 0.2 mg/dL (ref 0.0–1.0)
pH: 6 (ref 5.0–8.0)

## 2021-04-03 LAB — POC URINE PREG, ED: Preg Test, Ur: NEGATIVE

## 2021-04-03 MED ORDER — FLUCONAZOLE 150 MG PO TABS: 150.0000 mg | ORAL_TABLET | Freq: Every day | ORAL | 0 refills | Status: AC

## 2021-04-03 NOTE — Research (Signed)
[facility]-[place]-2102 - TREATMENT OF ESTABLISHED CHEMOTHERAPY-INDUCED NEUROPATHY WITH N-PALMITOYLETHANOLAMIDE, A CANNABIMIMETIC NUTRACEUTICAL: A RANDOMIZED DOUBLE-BLIND PHASE II PILOT TRIAL   WEEK 3 PHONE CALL   Patient was contacted via phone today (04/03/2021) at 11:12 am and again at 12:35 pm to complete study week 3 phone call. Identity was confirmed using two patient identifiers.  Patient states that she wishes to stop taking her study medication altogether and wants to stop actively participating in study.  Per patient she is worried that the study drug is interfering with her healing from her ongoing (pre-study) UTI/BV that she is being treated for right now and she is feeling overwhelmed trying to manage the study medication along with the rest of her daily life.  Patient states she is ok with continuing to receive f/u phone calls per protocol from Research.  Patient states she has an appt tomorrow morning (04/04/21) at 0730 for lab/port flush.  Pt agreed to be seen by Research Nurse during that appt to return drugs, return questionnaires, and fill out any necessary withdrawal paperwork.  MD Burr Medico made aware.              QUESTIONNAIRE: Patient has not completed their weekly questionnaire.  States she will bring it completed with her tomorrow morning in person.   INVESTIGATIONAL PRODUCT: Patient reports taking 0 capsules since the evening of 03/28/2021 when she first stopped taking them per 03/29/21 phone Research note, states she never restarted taking them since speaking with Research nurse on that date.  Patient is Never compliant with study medication.  Patient states she will bring remainder of medication with her to return to Research Nurse tomorrow morning in person (04/04/21).   ADVERSE EVENTS: USING CTCAE V 5.0 Per protocol (Appendix VIII) pt was asked about nausea, vomiting, and diarrhea.  Pt denies vomiting or diarrhea.  Pt reports mild ongoing nausea (Grade 1) since 03/29/21 that she has not taken any  antiemetics for.  MD Burr Medico has reviewed and determined this to be possibly related to study drug, reported per protocol. Patient reports baseline anxiety (Grade 1) that is managed with oral Klonopin 0.5 mg taken as needed BID, denies any changes from baseline. Pt reports resolution of mild forgetfulness today, however as this AE is not mentioned in CTCAE or study protocol as relevant or gradeable, reporting and intervention was not required. Pt reports resolution of fatigue (Grade 2) on 04/01/21 and no further instances of dizziness (Grade 1) since single episode on 03/28/21. Patient reports ongoing baseline UTI (symptoms of dysuria & polyuria) (Grade 2) that she states started before her study enrollment.  States that symptoms have returned and started to worsen over last few days even with antibiotic administration and that she was seen in Urgent Care this am (04/03/21) for ongoing UTI symptoms.  Patient states that she was started on Flagyl (per Urgent Care provider) this morning and finished taking her Bactrim prescription on 03/30/21. Patient states that she was also seen in Urgent Care today for ongoing vaginal infection (Grade 2) with symptoms of clear discharge and odor that she states started the same day as her UTI symptoms (before starting treatment protocol medication).  Patient started fluconazole today for treatment of vaginal/yeast infection per Urgent Care provider.   ADVERSE EVENT LOG: USING CTCAE V 5.0 Stephanie [name] [id number]   04/03/2021   Adverse Event Log   Study/Protocol: [facility] [place] 2102 Cycle: Week 3    Event Grade Onset Date Resolved Date Drug Name Attribution Treatment Comments  Anxiety  Grade 1 03/26/21 Ongoing (Baseline condition) PEA/Placebo Unrelated      Fatigue Grade 2 03/28/21 04/01/21 Resolved PEA/Placebo Unrelated Rest    UTI Grade 2 03/13/21 Baseline Resolved 03/29/21 PEA/Placebo Unrelated Oral antibiotic    UTI Grade 2 03/30/21 Baseline Ongoing PEA/Placebo Unrelated Oral  antibiotic   Vaginal infection Grade 2 03/13/21 Baseline Ongoing PEA/Placebo Unrelated Oral antifungal   Nausea Grade 1 03/29/21 Ongoing PEA/Placebo Possibly        MEDICATIONS: Pt denies any other changes in her medications or other treatments (per protocol, Appendix VIII) besides ones listed above.  Medication list updated.  No new allergies per pt.   Patient denies taking any other cannabinoid products like CBD and/or THC.  [name] '[place], RN, BSN Clinical Research Nurse I 04/04/21 3:02 PM

## 2021-04-03 NOTE — ED Triage Notes (Signed)
PT reported treatment treatment for BV and UTI.Pt  also report clear Vag DC with odor.

## 2021-04-03 NOTE — Discharge Instructions (Addendum)
-  For your yeast infection, start the Diflucan (fluconazole)- Take one pill today (day 1). If you're still having symptoms in 3 days, take the second pill.  -Complete course of flagyl -We are testing for BV, yeast, gonorrhea, chlamydia, trichomonas.  We are also sending a urine culture.  If anything comes back as positive, we will call you in 2 to 3 days and send additional treatment at that time.  Abstain from intercourse until negative results. -Follow-up with your gynecologist if symptoms worsen or persist.

## 2021-04-03 NOTE — ED Provider Notes (Signed)
[facility]    CSN: [id number] Arrival date & time: 04/03/21  7425      History   Chief Complaint Chief Complaint  Patient presents with   Urinary Tract Infection   BV    HPI Stephanie [name] is a 40 y.o. female presenting with clear vaginal discharge with odor x2 weeks.  Medical history colon cancer, colostomy present, bacterial vaginosis, neuropathy, UTI, genital herpes.  Notes clear vaginal discharge with odor.  This patient was seen by her gynecologist 2 weeks ago, initially prescribed Macrobid for UTI but this was changed to Bactrim due to resistance.  States her symptoms did not improve after this, so they then sent Flagyl per presumed BV.  She is on day 7 of Flagyl with minimal improvement in symptoms.  Notes continued clear vaginal discharge with odor. Occ mild crampy lower abd pain.  Denies vaginal rashes, lesions, itching.  She does note 1 new female partner, she is concerned for STI risk. Denies hematuria, dysuria, frequency, urgency, back pain, n/v/d, fevers/chills, abdnormal vaginal lesions/rashes.    HPI  Past Medical History:  Diagnosis Date   Anxiety    Blood transfusion without reported diagnosis    BV (bacterial vaginosis)    Cancer ([place])    Colon Cancer 03-2020   Colostomy present [facility])    03-2020   Family history of bladder cancer    History of chemotherapy    ended 09-2020   Lactose intolerance 03/12/2020   Neuromuscular disorder ([facility])    neuropathy feet hands legs   UTI (lower urinary tract infection)     Patient Active Problem List   Diagnosis Date Noted   Colorectal carcinoma ([place]) 03/08/2021   Genetic testing 01/28/2021   Abdominal fluid collection 12/31/2020   Metastatic colon cancer to liver ([place]) 12/13/2020   Family history of bladder cancer    Port-A-Cath in place 05/03/2020   Sepsis ([place]) 04/04/2020   SOB (shortness of breath) 04/04/2020   Moderate protein-calorie malnutrition ([place]) 04/04/2020   Nausea & vomiting 04/04/2020    TOA (tubo-ovarian abscess)    Intra-abdominal abscess ([place]) 03/31/2020   Lactose intolerance 03/12/2020   Microcytic anemia 03/12/2020   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   Peritonitis with abscess of intestine ([place]) 03/12/2020   Recurrent urinary tract infection 06/01/2012   Contraception, device intrauterine 03/23/2012   Genital herpes simplex 02/09/2012   Attention deficit disorder 02/27/2010   Anxiety state 02/27/2010    Past Surgical History:  Procedure Laterality Date   CESAREAN SECTION     COLECTOMY  03/2020   COLONOSCOPY     CYSTOSCOPY WITH STENT PLACEMENT  04/04/2020   Procedure: CYSTOSCOPY WITH STENT PLACEMENT;  Surgeon: [name], MD;  Location: [facility];  Service: General;;   LAPAROSCOPIC LIVER ULTRASOUND N/A 12/13/2020   Procedure: INTRAOPERATIVE LIVER ULTRASOUND;  Surgeon: [name], MD;  Location: [facility];  Service: General;  Laterality: N/A;   LAPAROSCOPY N/A 12/13/2020   Procedure: LAPAROSCOPY DIAGNOSTIC;  Surgeon: [name], MD;  Location: [facility];  Service: General;  Laterality: N/A;   LAPAROTOMY N/A 04/04/2020   Procedure: EXPLORATORY LAPAROTOMY;  Surgeon: [name], MD;  Location: [facility];  Service: General;  Laterality: N/A;   OPEN PARTIAL HEPATECTOMY  N/A 12/13/2020   Procedure: OPEN PARTIAL HEPATECTOMY;  Surgeon: [name], MD;  Location: [facility];  Service: General;  Laterality: N/A;  ROOM 2 STARTING AT 09:30AM FOR 300 MIN   PORTACATH PLACEMENT Right 04/12/2020   Procedure: INSERTION PORT-A-CATH  WITH ULTRASOUND;  Surgeon: Kinsinger, Arta Bruce, MD;  Location: [facility];  Service: General;  Laterality: Right;    OB History     Gravida  3   Para  1   Term  1   Preterm      AB  1   Living  1      SAB      IAB  1   Ectopic      Multiple      Live Births               Home Medications    Prior to Admission medications   Medication Sig Start Date End Date Taking? Authorizing Provider  fluconazole  (DIFLUCAN) 150 MG tablet Take 1 tablet (150 mg total) by mouth daily for 2 doses. -For your yeast infection, start the Diflucan (fluconazole)- Take one pill today (day 1). If you're still having symptoms in 3 days, take the second pill. 04/03/21 04/05/21 Yes Hazel Sams, PA-C  acetaminophen (TYLENOL) 325 MG tablet Take 650 mg by mouth daily as needed for fever or headache (pain).    [provider]  clonazePAM (KLONOPIN) 0.5 MG tablet Take 1 tablet (0.5 mg total) by mouth 2 (two) times daily as needed for anxiety. 03/27/21   [name], NP  Cyanocobalamin (B-12) 100 MCG TABS Take by mouth.    [provider]  etonogestrel-ethinyl estradiol (NUVARING) 0.12-0.015 MG/24HR vaginal ring Insert vaginally and leave in place for 3 consecutive weeks, then remove for 1 week. 02/28/21   Woodroe Mode, MD  ibuprofen (ADVIL) 200 MG tablet Take 800 mg by mouth every 6 (six) hours as needed for fever, headache or mild pain.    [provider]  Investigational palmitoylethanolamide/placebo 400 MG capsule [facility]CRU-[place]-2102 Take 1 capsule by mouth twice daily for 8 weeks. One capsule (400 mg) in the morning and one capsule (400 mg) in the evening, take with food and swallow whole. Do not crush or open capsule. Store at room temperature. Keep container tightly closed. 03/14/21   [name], MD  metroNIDAZOLE (FLAGYL) 500 MG tablet Take 1 tablet (500 mg total) by mouth 2 (two) times daily. 03/27/21   Shelly Bombard, MD  milk thistle 175 MG tablet Take 175 mg by mouth daily.    [provider]  Multiple Vitamin (MULTIVITAMIN ADULT PO) Take by mouth.    [provider]  Nutritional Supplements (ADULT NUTRITIONAL SUPPLEMENT PO) Take by mouth. Chlorella algae tablet daily    [provider]  PARAGARD INTRAUTERINE COPPER IU 1 Device by Intrauterine route once.     [provider]  polyethylene glycol (MIRALAX / GLYCOLAX) 17 g packet Take 17 g by mouth daily. PRN  01/27/21   [provider]  Probiotic Product (PROBIOTIC ADVANCED PO) Take by mouth.    [provider]  sulfamethoxazole-trimethoprim (BACTRIM DS) 800-160 MG tablet Take 1 tablet by mouth 2 (two) times daily. 03/25/21   Griffin Basil, MD    Family History Family History  Problem Relation Age of Onset   Heart disease Father    Stroke Father    Aneurysm Father    Bladder Cancer Maternal Grandmother        dx 90s   Colon cancer Neg Hx    Esophageal cancer Neg Hx    Colon polyps Neg Hx    Stomach cancer Neg Hx    Rectal cancer Neg Hx     Social History Social  History   Tobacco Use   Smoking status: Former    Packs/day: 0.50    Years: 10.00    Pack years: 5.00    Types: Cigarettes   Smokeless tobacco: Never  Vaping Use   Vaping Use: Never used  Substance Use Topics   Alcohol use: Yes    Comment: seldom   Drug use: No     Allergies   Oxaliplatin   Review of Systems Review of Systems  Constitutional:  Negative for appetite change, chills, diaphoresis, fever and unexpected weight change.  HENT:  Negative for congestion, ear pain, sinus pressure, sinus pain, sneezing, sore throat and trouble swallowing.   Respiratory:  Negative for cough, chest tightness and shortness of breath.   Cardiovascular:  Negative for chest pain.  Gastrointestinal:  Negative for abdominal distention, abdominal pain, anal bleeding, blood in stool, constipation, diarrhea, nausea, rectal pain and vomiting.  Genitourinary:  Positive for vaginal discharge. Negative for dysuria, flank pain, frequency and urgency.  Musculoskeletal:  Negative for back pain and myalgias.  Neurological:  Negative for dizziness, light-headedness and headaches.  All other systems reviewed and are negative.   Physical Exam Triage Vital Signs ED Triage Vitals  Enc Vitals Group     BP 04/03/21 0916 98/70     Pulse Rate 04/03/21 0916 78     Resp 04/03/21 0916 18     Temp 04/03/21 0916 98.8 F (37.1  C)     Temp src --      SpO2 04/03/21 0916 95 %     Weight --      Height --      Head Circumference --      Peak Flow --      Pain Score 04/03/21 0918 0     Pain Loc --      Pain Edu? --      Excl. in [place]? --    No data found.  Updated Vital Signs BP 98/70   Pulse 78   Temp 98.8 F (37.1 C)   Resp 18   LMP 03/11/2021 (Exact Date)   SpO2 95%   Visual Acuity Right Eye Distance:   Left Eye Distance:   Bilateral Distance:    Right Eye Near:   Left Eye Near:    Bilateral Near:     Physical Exam Vitals reviewed.  Constitutional:      General: She is not in acute distress.    Appearance: Normal appearance. She is not ill-appearing.  HENT:     Head: Normocephalic and atraumatic.     Mouth/Throat:     Mouth: Mucous membranes are moist.     Comments: Moist mucous membranes Eyes:     Extraocular Movements: Extraocular movements intact.     Pupils: Pupils are equal, round, and reactive to light.  Cardiovascular:     Rate and Rhythm: Normal rate and regular rhythm.     Heart sounds: Normal heart sounds.  Pulmonary:     Effort: Pulmonary effort is normal.     Breath sounds: Normal breath sounds. No wheezing, rhonchi or rales.  Abdominal:     General: Bowel sounds are normal. There is no distension.     Palpations: Abdomen is soft. There is no mass.     Tenderness: There is no abdominal tenderness. There is no right CVA tenderness, left CVA tenderness, guarding or rebound.     Comments: No reproducible abd pain Colostomy bag in place  Genitourinary:    Comments: deferred Skin:  General: Skin is warm.     Capillary Refill: Capillary refill takes less than 2 seconds.     Comments: Good skin turgor  Neurological:     General: No focal deficit present.     Mental Status: She is alert and oriented to person, place, and time.  Psychiatric:        Mood and Affect: Mood normal.        Behavior: Behavior normal.     UC Treatments / Results  Labs (all labs ordered  are listed, but only abnormal results are displayed) Labs Reviewed  POCT URINALYSIS DIPSTICK, ED / UC - Abnormal; Notable for the following components:      Result Value   Hgb urine dipstick TRACE (*)    Leukocytes,Ua SMALL (*)    All other components within normal limits  URINE CULTURE  POCT URINALYSIS DIPSTICK, ED / UC  POC URINE PREG, ED  CERVICOVAGINAL ANCILLARY ONLY    EKG   Radiology No results found.  Procedures Procedures (including critical care time)  Medications Ordered in UC Medications - No data to display  Initial Impression / Assessment and Plan / UC Course  I have reviewed the triage vital signs and the nursing notes.  Pertinent labs & imaging results that were available during my care of the patient were reviewed by me and considered in my medical decision making (see chart for details).     This patient is a very pleasant 39 y.o. year old female presenting with vaginitis.  Afebrile, nontachycardic, no abdominal pain or CVAT on exam.  This patient does have a current diagnosis of colorectal cancer in remission and is followed closely by oncology and GYN.  She was last seen by GYN on 6/27 and just finished treatment for UTI with bactrim (resistant to macrobid), and is on day 7 of flagyl for presumed BV. She does have one new female partner.  UA with trace blood, trace leuk, culture sent. States she is not pregnant. Self swab sent for BV, yeast, gonorrhea, chlamydia, trichomonas.  Declines HIV, RPR.  Safe sex precautions.  Complete course of Flagyl, Diflucan sent as below.  If symptoms persist, recommended following up with GYN.  She verbalizes understanding and agreement.  ED return precautions discussed. Patient verbalizes understanding and agreement.   Coding this visit a level 4 for review of past notes and labs (6/22), ordering and interpretation of test today, and prescription drug management.  Final Clinical Impressions(s) / UC Diagnoses   Final  diagnoses:  Vaginitis and vulvovaginitis  Recent urinary tract infection  Routine screening for STI (sexually transmitted infection)  History of colon cancer     Discharge Instructions      -For your yeast infection, start the Diflucan (fluconazole)- Take one pill today (day 1). If you're still having symptoms in 3 days, take the second pill.  -Complete course of flagyl -We are testing for BV, yeast, gonorrhea, chlamydia, trichomonas.  We are also sending a urine culture.  If anything comes back as positive, we will call you in 2 to 3 days and send additional treatment at that time.  Abstain from intercourse until negative results. -Follow-up with your gynecologist if symptoms worsen or persist.     ED Prescriptions     Medication Sig Dispense Auth. Provider   fluconazole (DIFLUCAN) 150 MG tablet Take 1 tablet (150 mg total) by mouth daily for 2 doses. -For your yeast infection, start the Diflucan (fluconazole)- Take one pill today (day 1). If  you're still having symptoms in 3 days, take the second pill. 2 tablet Hazel Sams, PA-C      PDMP not reviewed this encounter.   Hazel Sams, PA-C 04/03/21 1016

## 2021-04-04 ENCOUNTER — Inpatient Hospital Stay: Payer: Medicaid Other | Attending: Nurse Practitioner

## 2021-04-04 ENCOUNTER — Encounter: Payer: Self-pay | Admitting: Obstetrics & Gynecology

## 2021-04-04 ENCOUNTER — Telehealth (HOSPITAL_COMMUNITY): Payer: Self-pay | Admitting: Emergency Medicine

## 2021-04-04 ENCOUNTER — Encounter: Payer: Self-pay | Admitting: Hematology

## 2021-04-04 ENCOUNTER — Other Ambulatory Visit: Payer: Self-pay

## 2021-04-04 ENCOUNTER — Ambulatory Visit (INDEPENDENT_AMBULATORY_CARE_PROVIDER_SITE_OTHER): Payer: Medicaid Other | Admitting: Obstetrics & Gynecology

## 2021-04-04 ENCOUNTER — Inpatient Hospital Stay: Payer: Medicaid Other | Admitting: Emergency Medicine

## 2021-04-04 VITALS — BP 107/70 | HR 62 | Wt 153.0 lb

## 2021-04-04 DIAGNOSIS — C19 Malignant neoplasm of rectosigmoid junction: Secondary | ICD-10-CM

## 2021-04-04 DIAGNOSIS — Z95828 Presence of other vascular implants and grafts: Secondary | ICD-10-CM

## 2021-04-04 DIAGNOSIS — N898 Other specified noninflammatory disorders of vagina: Secondary | ICD-10-CM | POA: Diagnosis not present

## 2021-04-04 DIAGNOSIS — Z85048 Personal history of other malignant neoplasm of rectum, rectosigmoid junction, and anus: Secondary | ICD-10-CM | POA: Insufficient documentation

## 2021-04-04 LAB — CBC WITH DIFFERENTIAL (CANCER CENTER ONLY)
Abs Immature Granulocytes: 0.01 10*3/uL (ref 0.00–0.07)
Basophils Absolute: 0 10*3/uL (ref 0.0–0.1)
Basophils Relative: 1 %
Eosinophils Absolute: 0.1 10*3/uL (ref 0.0–0.5)
Eosinophils Relative: 2 %
HCT: 39.6 % (ref 36.0–46.0)
Hemoglobin: 13.1 g/dL (ref 12.0–15.0)
Immature Granulocytes: 0 %
Lymphocytes Relative: 20 %
Lymphs Abs: 0.9 10*3/uL (ref 0.7–4.0)
MCH: 29.6 pg (ref 26.0–34.0)
MCHC: 33.1 g/dL (ref 30.0–36.0)
MCV: 89.6 fL (ref 80.0–100.0)
Monocytes Absolute: 0.3 10*3/uL (ref 0.1–1.0)
Monocytes Relative: 8 %
Neutro Abs: 3 10*3/uL (ref 1.7–7.7)
Neutrophils Relative %: 69 %
Platelet Count: 115 10*3/uL — ABNORMAL LOW (ref 150–400)
RBC: 4.42 MIL/uL (ref 3.87–5.11)
RDW: 14.4 % (ref 11.5–15.5)
WBC Count: 4.3 10*3/uL (ref 4.0–10.5)
nRBC: 0 % (ref 0.0–0.2)

## 2021-04-04 LAB — CMP (CANCER CENTER ONLY)
ALT: 14 U/L (ref 0–44)
AST: 15 U/L (ref 15–41)
Albumin: 3.3 g/dL — ABNORMAL LOW (ref 3.5–5.0)
Alkaline Phosphatase: 56 U/L (ref 38–126)
Anion gap: 7 (ref 5–15)
BUN: 7 mg/dL (ref 6–20)
CO2: 23 mmol/L (ref 22–32)
Calcium: 8.6 mg/dL — ABNORMAL LOW (ref 8.9–10.3)
Chloride: 109 mmol/L (ref 98–111)
Creatinine: 0.69 mg/dL (ref 0.44–1.00)
GFR, Estimated: >60 mL/min (ref >60)
Glucose, Bld: 93 mg/dL (ref 70–99)
Potassium: 4 mmol/L (ref 3.5–5.1)
Sodium: 139 mmol/L (ref 135–145)
Total Bilirubin: 0.3 mg/dL (ref 0.3–1.2)
Total Protein: 6.5 g/dL (ref 6.5–8.1)

## 2021-04-04 LAB — CERVICOVAGINAL ANCILLARY ONLY
Bacterial Vaginitis (gardnerella): POSITIVE — AB
Candida Glabrata: POSITIVE — AB
Candida Vaginitis: POSITIVE — AB
Chlamydia: NEGATIVE
Comment: NEGATIVE
Comment: NEGATIVE
Comment: NEGATIVE
Comment: NEGATIVE
Comment: NEGATIVE
Comment: NORMAL
Neisseria Gonorrhea: NEGATIVE
Trichomonas: NEGATIVE

## 2021-04-04 LAB — IRON AND TIBC
Iron: 64 ug/dL (ref 41–142)
Saturation Ratios: 20 % — ABNORMAL LOW (ref 21–57)
TIBC: 318 ug/dL (ref 236–444)
UIBC: 254 ug/dL (ref 120–384)

## 2021-04-04 LAB — FERRITIN: Ferritin: 19 ng/mL (ref 11–307)

## 2021-04-04 LAB — URINE CULTURE: Culture: 30000 — AB

## 2021-04-04 LAB — CEA (IN HOUSE-CHCC): CEA (CHCC-In House): 1.97 ng/mL (ref 0.00–5.00)

## 2021-04-04 MED ORDER — HEPARIN SOD (PORK) LOCK FLUSH 100 UNIT/ML IV SOLN
500.0000 [IU] | Freq: Once | INTRAVENOUS | Status: AC
  Administered 2021-04-04: 500 [IU]
  Filled 2021-04-04: qty 5

## 2021-04-04 MED ORDER — CLINDAMYCIN PHOSPHATE 2 % VA CREA: 1.0000 | TOPICAL_CREAM | Freq: Every day | VAGINAL | 0 refills | Status: DC

## 2021-04-04 MED ORDER — SODIUM CHLORIDE 0.9% FLUSH
10.0000 mL | Freq: Once | INTRAVENOUS | Status: AC
  Administered 2021-04-04: 10 mL
  Filled 2021-04-04: qty 10

## 2021-04-04 NOTE — Progress Notes (Signed)
RGYN presents for problem visit .  Pt seen yesterday at ER for UTI and BV per notes pt just completed BV treatment and was given Diflucan for yeast yesterday,  Urine Cx was done and vaginal swab was done results are pending and per notes they will call pt w/ results in 2 to 3 days.   Per pt she completed BV tx yesterday, took first diflucan yesterday. Pt c/o odor and discharge.

## 2021-04-04 NOTE — Research (Signed)
TRIAL: ACCRU-[place]-2102 - TREATMENT OF ESTABLISHED CHEMOTHERAPY-INDUCED NEUROPATHY WITH N-PALMITOYLETHANOLAMIDE, A CANNABIMIMETIC NUTRACEUTICAL: A RANDOMIZED DOUBLE-BLIND PHASE II PILOT TRIAL   Patient arrives today Unaccompanied to withdraw from study and return study drug remainder.  Patient provided with and reviewed Withdrawal of Treatment Consent, signed consent with understanding that she will be contacted per protocol at 6 and 12 months for f/u.   PROs: Pt states she completed Week 3 questionnaire and mailed it out yesterday.  Pt states she threw out the remaining weekly questionnaires and prepaid envelopes.     MEDICATION DIARIES and IRT:  Two bottles were returned still sealed (originally 30 pills in each bottle).  One bottle was returned with 29 pills.  Patient states she threw the fourth bottle away when she finished it (30 pills).  89 pills total confirmed with PharmD Raul Del.  Pt states she stopped taking her twice daily dose of study drug on the evening of 03/28/2021.  None of the bottles had lot numbers.  Confirmed all original patient information was the same for three returned bottles.  Counts and correct patient information were confirmed with pharmacist Raul Del on 04/05/21 before being returned to pharmacy for return/destruction per protocol.  The patient was thanked for their time and voluntary participation in this study. Patient Stephanie [name] has been provided direct contact information and is encouraged to contact this Nurse for any needs or questions.  Will call pt for 6 month f/u in Jan 2023.  [name] '[place], RN, BSN Clinical Research Nurse I 04/05/21 2:11 PM

## 2021-04-04 NOTE — Progress Notes (Unsigned)
Patient presented this morning for for labs and port flush.  Patient explained to this nurse that she is having to change her colostomy bag multiple times a day related to leakage from her stoma retracting.  Patient is very concerned due to her supplies running low and she is unable to obtain more until 04/22/21.  This nurse submitted referral to the [facility] for consultation related to Stoma retraction and leakage.

## 2021-04-04 NOTE — Progress Notes (Signed)
Patient ID: Stephanie [name], female   DOB: July 03, 1981, 40 y.o.   MRN: [id number]  Chief Complaint  Patient presents with   Follow-up   Urinary Tract Infection   Vaginal Discharge    HPI Stephanie [name] is a 40 y.o. female.  [id number] Patient's last menstrual period was 03/11/2021 (exact date). She has questions about encounter yesterday at [facility] to screen for STD as she has vaginal discharge. Result from test is pending, urine should mixed flora. Rx for diflucan and flagyl was given. 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[facility])    03-2020   Family history of bladder cancer    History of chemotherapy    ended 09-2020   Lactose intolerance 03/12/2020   Neuromuscular disorder ([place])    neuropathy feet hands legs   UTI (lower urinary tract infection)     Past Surgical History:  Procedure Laterality Date   CESAREAN SECTION     COLECTOMY  03/2020   COLONOSCOPY     CYSTOSCOPY WITH STENT PLACEMENT  04/04/2020   Procedure: CYSTOSCOPY WITH STENT PLACEMENT;  Surgeon: [name], MD;  Location: [facility];  Service: General;;   LAPAROSCOPIC LIVER ULTRASOUND N/A 12/13/2020   Procedure: INTRAOPERATIVE LIVER ULTRASOUND;  Surgeon: [name], MD;  Location: [place];  Service: General;  Laterality: N/A;   LAPAROSCOPY N/A 12/13/2020   Procedure: LAPAROSCOPY DIAGNOSTIC;  Surgeon: [name], MD;  Location: [facility];  Service: General;  Laterality: N/A;   LAPAROTOMY N/A 04/04/2020   Procedure: EXPLORATORY LAPAROTOMY;  Surgeon: [name], MD;  Location: [facility];  Service: General;  Laterality: N/A;   OPEN PARTIAL HEPATECTOMY  N/A 12/13/2020   Procedure: OPEN PARTIAL HEPATECTOMY;  Surgeon: [name], MD;  Location: [place];  Service: General;  Laterality: N/A;  ROOM 2 STARTING AT 09:30AM FOR 300 MIN   PORTACATH PLACEMENT Right  04/12/2020   Procedure: INSERTION PORT-A-CATH WITH ULTRASOUND;  Surgeon: Mickeal Skinner, MD;  Location: [facility];  Service: General;  Laterality: Right;    Family History  Problem Relation Age of Onset   Heart disease Father    Stroke Father    Aneurysm Father    Bladder Cancer Maternal Grandmother        dx 90s   Colon cancer Neg Hx    Esophageal cancer Neg Hx    Colon polyps Neg Hx    Stomach cancer Neg Hx    Rectal cancer Neg Hx     Social History Social History   Tobacco Use   Smoking status: Former    Packs/day: 0.50    Years: 10.00    Pack years: 5.00    Types: Cigarettes   Smokeless tobacco: Never  Vaping Use   Vaping Use: Never used  Substance Use Topics   Alcohol use: Yes    Comment: seldom   Drug use: No    Allergies  Allergen Reactions   Oxaliplatin Shortness Of Breath    Current Outpatient Medications  Medication Sig Dispense Refill   acetaminophen (TYLENOL) 325 MG tablet Take 650 mg by mouth daily as needed for fever or headache (pain).     clonazePAM (KLONOPIN) 0.5 MG tablet Take 1 tablet (0.5 mg total) by mouth 2 (two) times daily as needed for anxiety. 30 tablet 0  Cyanocobalamin (B-12) 100 MCG TABS Take by mouth.     etonogestrel-ethinyl estradiol (NUVARING) 0.12-0.015 MG/24HR vaginal ring Insert vaginally and leave in place for 3 consecutive weeks, then remove for 1 week. 1 each 12   fluconazole (DIFLUCAN) 150 MG tablet Take 1 tablet (150 mg total) by mouth daily for 2 doses. -For your yeast infection, start the Diflucan (fluconazole)- Take one pill today (day 1). If you're still having symptoms in 3 days, take the second pill. 2 tablet 0   ibuprofen (ADVIL) 200 MG tablet Take 800 mg by mouth every 6 (six) hours as needed for fever, headache or mild pain.     Investigational palmitoylethanolamide/placebo 400 MG capsule [id number] Take 1 capsule by mouth twice daily for 8 weeks. One capsule (400 mg) in the morning and one capsule (400 mg) in  the evening, take with food and swallow whole. Do not crush or open capsule. Store at room temperature. Keep container tightly closed. (Patient not taking: No sig reported) 120 capsule 0   metroNIDAZOLE (FLAGYL) 500 MG tablet Take 1 tablet (500 mg total) by mouth 2 (two) times daily. 14 tablet 0   milk thistle 175 MG tablet Take 175 mg by mouth daily.     Multiple Vitamin (MULTIVITAMIN ADULT PO) Take by mouth.     Nutritional Supplements (ADULT NUTRITIONAL SUPPLEMENT PO) Take by mouth. Chlorella algae tablet daily     PARAGARD INTRAUTERINE COPPER IU 1 Device by Intrauterine route once.      polyethylene glycol (MIRALAX / GLYCOLAX) 17 g packet Take 17 g by mouth daily. PRN     Probiotic Product (PROBIOTIC ADVANCED PO) Take by mouth.     sulfamethoxazole-trimethoprim (BACTRIM DS) 800-160 MG tablet Take 1 tablet by mouth 2 (two) times daily. (Patient not taking: No sig reported) 10 tablet 0   No current facility-administered medications for this visit.    Review of Systems Review of Systems  Constitutional: Negative.   Genitourinary:  Positive for vaginal bleeding (small amount of BTB with NuvaRing) and vaginal discharge.   Blood pressure 107/70, pulse 62, weight 153 lb (69.4 kg), last menstrual period 03/11/2021.  Physical Exam Physical Exam Vitals and nursing note reviewed.  Constitutional:      Appearance: Normal appearance.  Pulmonary:     Effort: Pulmonary effort is normal.  Neurological:     Mental Status: She is alert.  Psychiatric:        Mood and Affect: Mood normal.        Behavior: Behavior normal.    Data Reviewed 7/7 labs from oncology reviewed with her STD screen is pending  Assessment Possible BV  Plan If she does not respond to treatment consider alternative medication    [name] 04/04/2021, 10:01 AM

## 2021-04-05 ENCOUNTER — Encounter: Payer: Self-pay | Admitting: Hematology

## 2021-04-09 ENCOUNTER — Other Ambulatory Visit: Payer: Self-pay

## 2021-04-09 ENCOUNTER — Encounter: Payer: Self-pay | Admitting: Hematology

## 2021-04-09 ENCOUNTER — Ambulatory Visit (INDEPENDENT_AMBULATORY_CARE_PROVIDER_SITE_OTHER): Payer: Medicaid Other | Admitting: Clinical

## 2021-04-09 ENCOUNTER — Other Ambulatory Visit: Payer: Self-pay | Admitting: Nurse Practitioner

## 2021-04-09 DIAGNOSIS — F4322 Adjustment disorder with anxiety: Secondary | ICD-10-CM | POA: Diagnosis not present

## 2021-04-09 DIAGNOSIS — C19 Malignant neoplasm of rectosigmoid junction: Secondary | ICD-10-CM

## 2021-04-09 DIAGNOSIS — F411 Generalized anxiety disorder: Secondary | ICD-10-CM

## 2021-04-09 MED ORDER — CLINDAMYCIN PHOSPHATE 2 % VA CREA: 1.0000 | TOPICAL_CREAM | Freq: Every day | VAGINAL | 0 refills | Status: DC

## 2021-04-09 NOTE — Telephone Encounter (Signed)
Patient is requesting medication for BV since last dose did not wok. Per Dr.Arnold can try Cleocin 100 vaginally for three days.
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
## 2021-04-11 ENCOUNTER — Other Ambulatory Visit: Payer: Self-pay | Admitting: Nurse Practitioner

## 2021-04-11 DIAGNOSIS — C19 Malignant neoplasm of rectosigmoid junction: Secondary | ICD-10-CM

## 2021-04-11 DIAGNOSIS — F411 Generalized anxiety disorder: Secondary | ICD-10-CM

## 2021-04-11 MED ORDER — CLONAZEPAM 0.5 MG PO TABS: 0.5000 mg | ORAL_TABLET | Freq: Every day | ORAL | 0 refills | Status: DC | PRN

## 2021-04-15 ENCOUNTER — Other Ambulatory Visit: Payer: Self-pay

## 2021-04-15 ENCOUNTER — Ambulatory Visit (HOSPITAL_COMMUNITY)
Admission: RE | Admit: 2021-04-15 | Discharge: 2021-04-15 | Disposition: A | Discharge status: Home / Self Care | Payer: Medicaid Other | Source: Ambulatory Visit | Attending: Hematology | Admitting: Hematology

## 2021-04-15 DIAGNOSIS — C19 Malignant neoplasm of rectosigmoid junction: Secondary | ICD-10-CM | POA: Diagnosis present

## 2021-04-15 DIAGNOSIS — Z933 Colostomy status: Secondary | ICD-10-CM | POA: Diagnosis not present

## 2021-04-15 DIAGNOSIS — K9400 Colostomy complication, unspecified: Secondary | ICD-10-CM | POA: Diagnosis not present

## 2021-04-15 NOTE — Discharge Instructions (Signed)
I will call [facility] to update order.  Wear ostomy belt.  Begin convex barrier Call office if needed further Follow up with surgeon regarding reversal!

## 2021-04-15 NOTE — Progress Notes (Signed)
[facility]   Reason for visit:  Pouching issues due to retracted stoma. 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  ROS  Review of Systems  Gastrointestinal:        LLQ colostomy  Vital signs:  BP 104/63   Pulse (!) 55   Temp 98 F (36.7 C) (Oral)   Resp 18   SpO2 100%  Exam:  Physical Exam Abdominal:     Comments: LLQ colostomy  Skin:    General: Skin is warm.     Findings: Rash present.    Stoma type/location:  LLQ colostomy Stomal assessment/size:  1 1/4" pink round stoma, retracts and protrudes with peristalsis.  Patient states this causes her to lose seal at times.  She wears a 2piece closed end pouch with barrier ring (flat)  Peristomal assessment:  Skin intact.  Some medical adhesive related skin injury (MARSI) noted at distal edge from 10 to 1 o'clock Treatment options for stomal/peristomal skin: Will switch to convex barrier and keep closed end pouch.   Output: soft brown stool.  Ostomy pouching:2pc.  2 1/4" system with closed end pouch Education provided:  Demonstrated application of barrier ring and convex barrier .  Will add ostomy belt for added security with pouch fit.     Impression/dx  Colostomy complication Discussion  Implement convex barrier to closed end pouch Plan  Applied new barrier gave one additional barrier for use with closed end pouch. Will call [facility] and update orders.      Visit time: 50  minutes.   Domenic Moras FNP-BC

## 2021-04-17 ENCOUNTER — Other Ambulatory Visit: Payer: Self-pay

## 2021-04-17 MED ORDER — NUVESSA 1.3 % VA GEL: 1.0000 | Freq: Every day | VAGINAL | 1 refills | Status: AC

## 2021-04-17 NOTE — Progress Notes (Signed)
Rx for [employer]. Gel sent to Pharmacy.  PA was obtained for Clindamycin 04/15/21 but [facility] is unable to process.  EM#[id number] Effective 04/15/21-04/13/2022 Conf#[id number]

## 2021-04-22 ENCOUNTER — Other Ambulatory Visit: Payer: Self-pay | Admitting: General Surgery

## 2021-04-23 ENCOUNTER — Ambulatory Visit (HOSPITAL_COMMUNITY): Payer: Medicaid Other | Admitting: Clinical

## 2021-04-25 ENCOUNTER — Other Ambulatory Visit: Payer: Self-pay

## 2021-04-25 ENCOUNTER — Ambulatory Visit (HOSPITAL_COMMUNITY)
Admission: RE | Admit: 2021-04-25 | Discharge: 2021-04-25 | Disposition: A | Discharge status: Home / Self Care | Payer: Medicaid Other | Source: Ambulatory Visit | Attending: Obstetrics & Gynecology | Admitting: Obstetrics & Gynecology

## 2021-04-25 DIAGNOSIS — Z975 Presence of (intrauterine) contraceptive device: Secondary | ICD-10-CM | POA: Diagnosis present

## 2021-04-25 IMAGING — US US PELVIS COMPLETE WITH TRANSVAGINAL
1 series · 14 of 25 positions shown · non-contrast
Comparison: Prior ultrasound from [DATE] tube.

CLINICAL DATA: History of prior ultrasound with complex cystic
area. Follow-up evaluation.



[Series 1: us pelvis complete mc & wl · 14 of 98 slices shown]
[im 1/98]
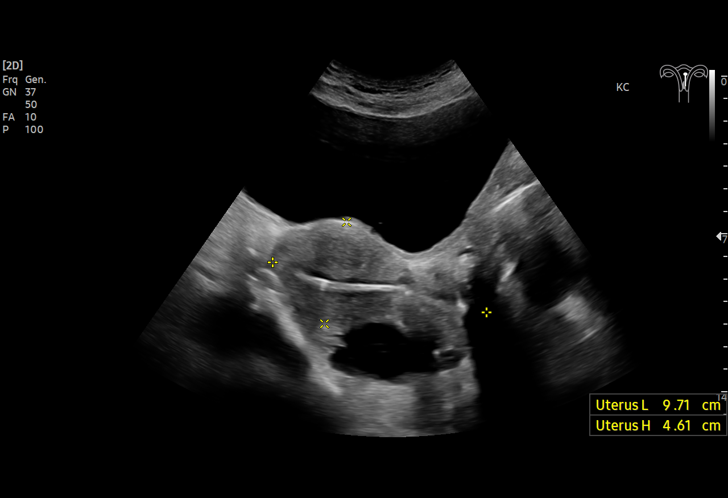
[im 9/98]
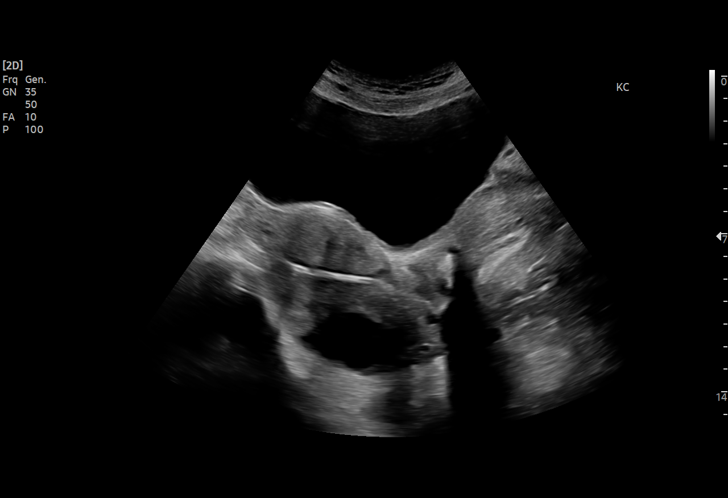
[im 17/98]
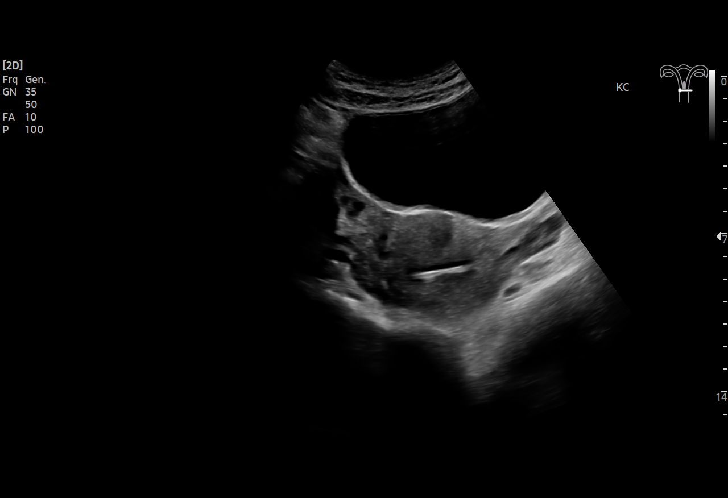
[im 25/98]
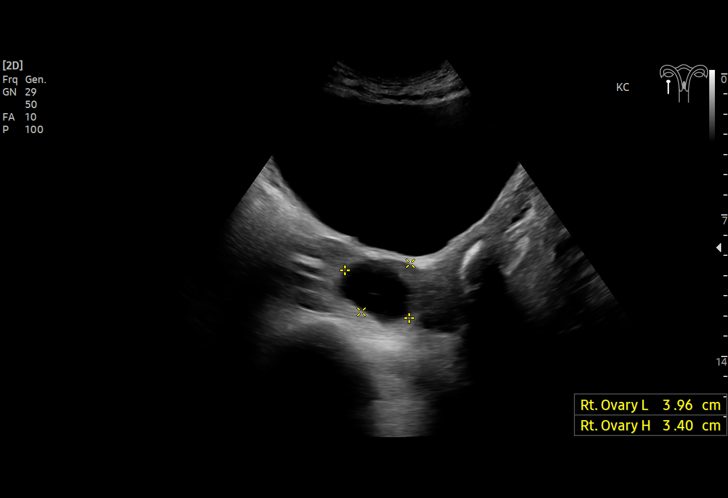
[im 33/98]
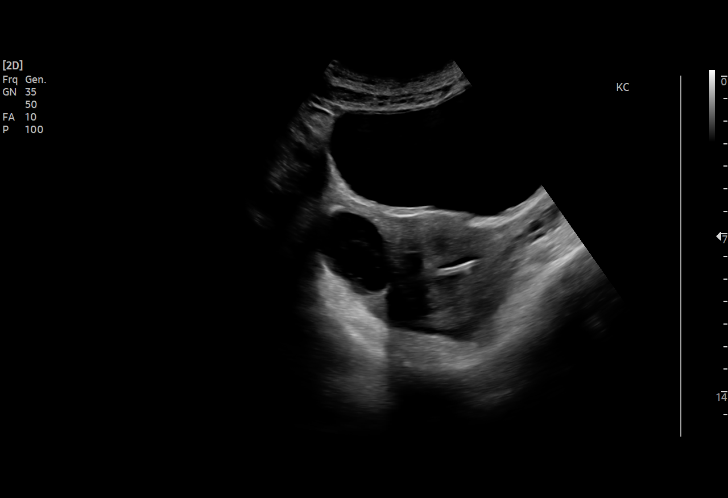
[im 37/98]
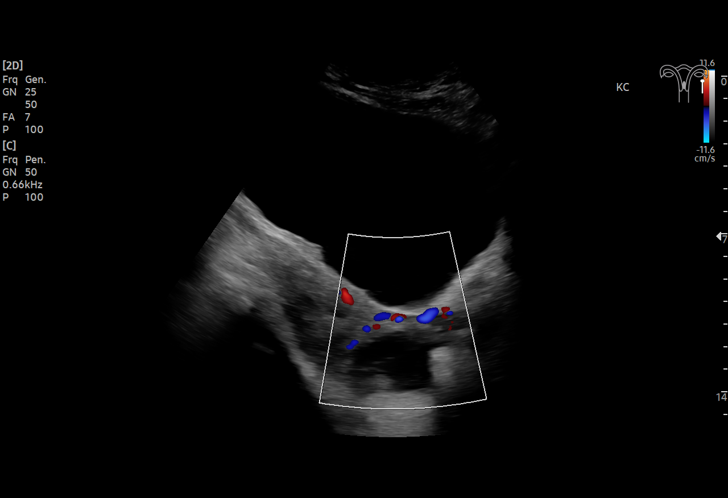
[im 45/98]
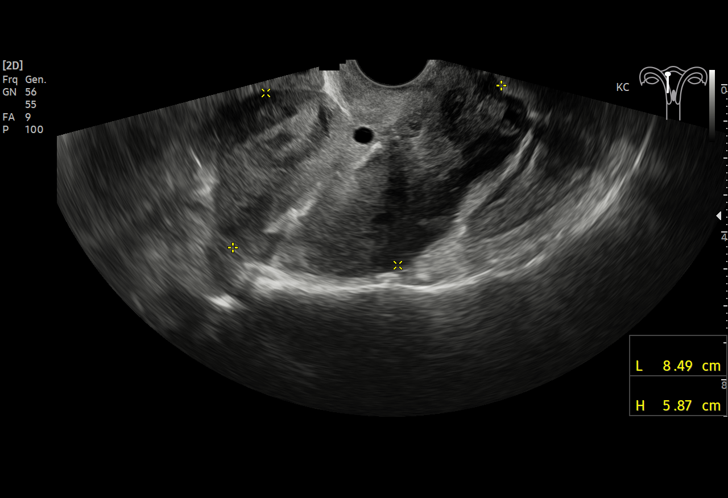
[im 53/98]
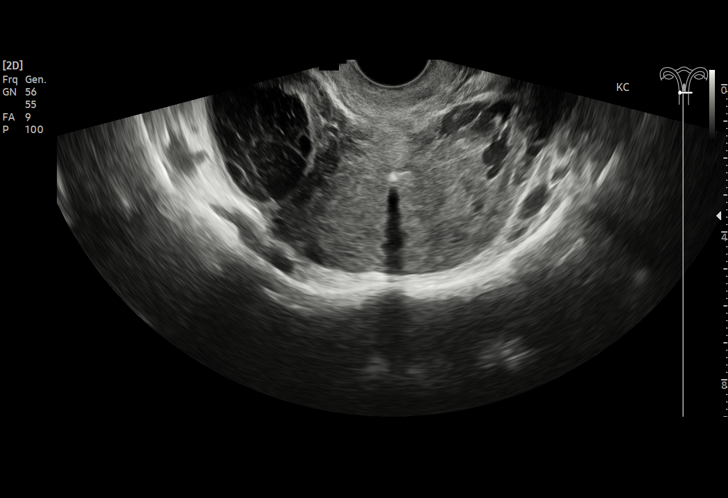
[im 61/98]
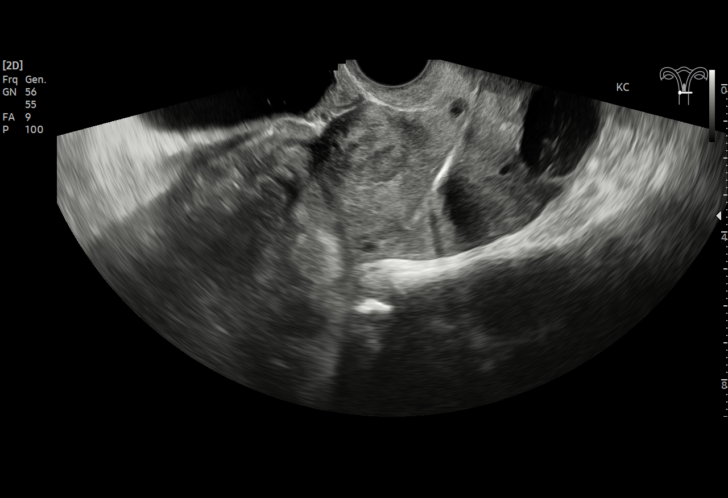
[im 65/98]
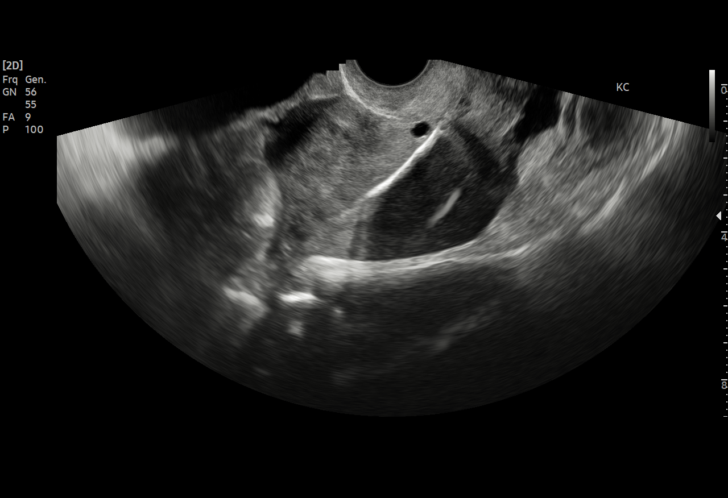
[im 73/98]
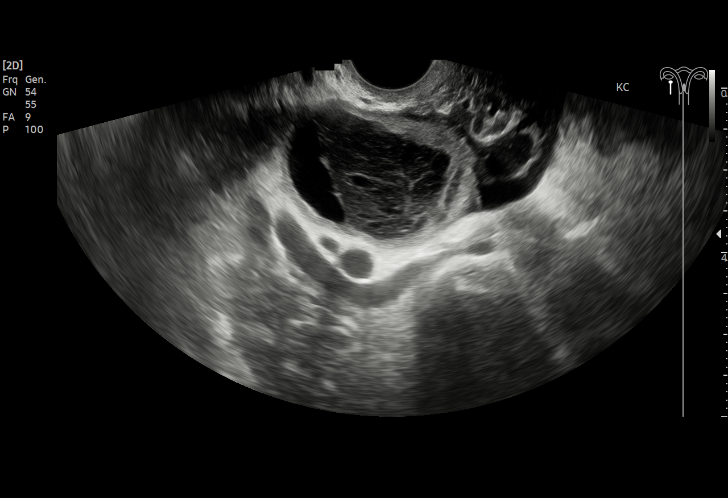
[im 81/98]
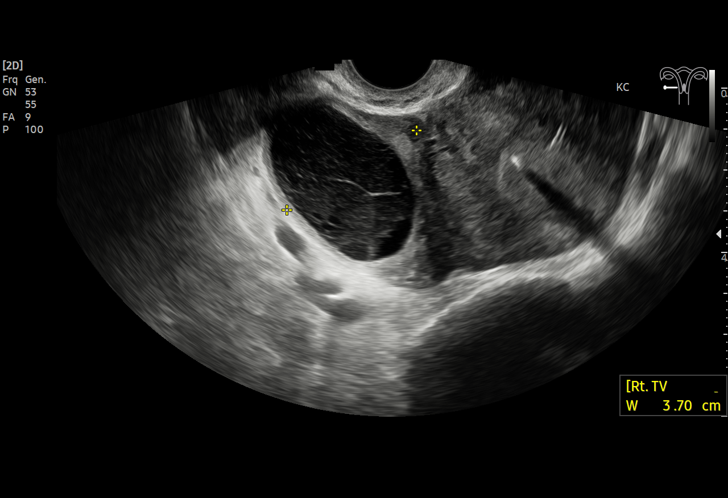
[im 89/98]
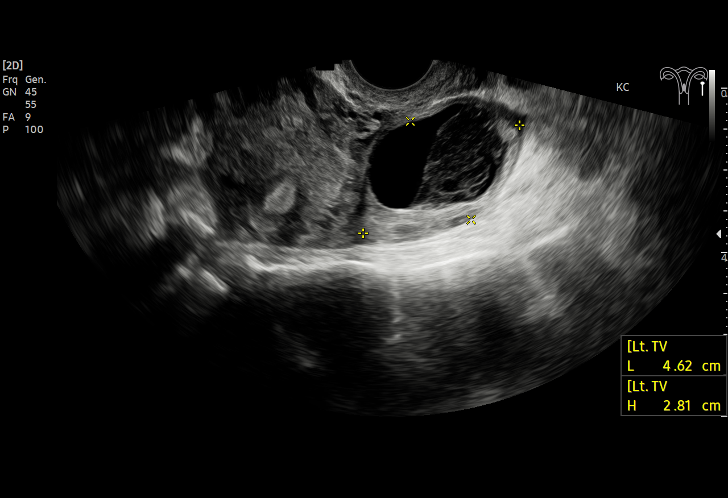
[im 98/98]
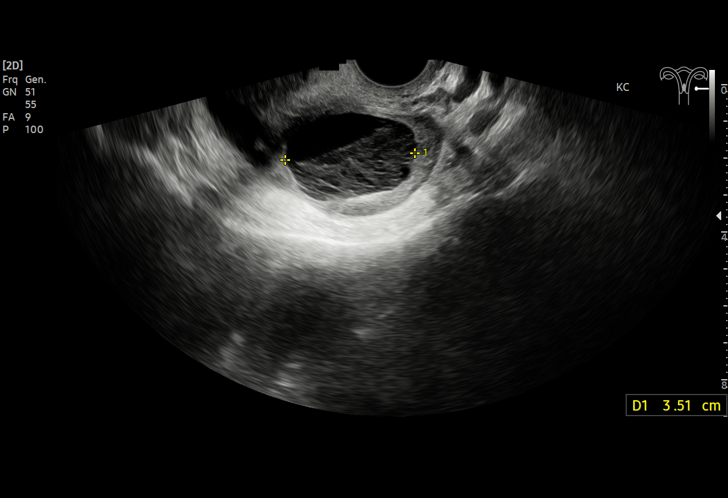

[14 of 25 positions shown; findings below may reference images not displayed]

FINDINGS: Uterus

Measurements: 8.5 x 5.9 x 5.8 cm = volume: 152 mL. Leiomyoma in the
anterior uterus measures approximately 2.0 x 2.0 x 2.4 cm and is
little changed compared to prior imaging.

Endometrium

Thickness: 0.5 mm, IUD in place. IUD appears appropriately
position.

Right ovary

Measurements: 5 x 3.2 x 3.7 (volume = 30) cm = volume: 30 mL. Cystic
area within the ovary and other small follicles, dominant area 4.3 x
3.2 x 3.3 cm with "fishnet" internal appearance.

Left ovary

Measurements: 4.6 x 2.8 x 5.0 (volume = 34) cm = volume: 34 mL.
Dominant cystic area in the LEFT ovary also with "fishnet" internal
appearance, no mural nodularity. This measures 3.7 x 3.5 x 2.6 cm.

Other findings

Small volume free fluid.
IMPRESSION: IUD in place with uterine fibroid.

Bilateral hemorrhagic ovarian cysts. No follow up imaging
recommended. Note: This recommendation does not apply to
premenarchal patients or to those with increased risk (genetic,
family history, elevated tumor markers or other high-risk factors)
of ovarian cancer. Reference: Radiology [DATE]):[id number].

## 2021-04-26 ENCOUNTER — Telehealth: Payer: Self-pay

## 2021-04-26 NOTE — Telephone Encounter (Signed)
TC from pt requesting pain Rx due to painful cramping Pt notes having fibroids had recent U/S  OTC medication is not helping please advise.

## 2021-04-27 ENCOUNTER — Encounter (HOSPITAL_COMMUNITY): Payer: Self-pay | Admitting: Emergency Medicine

## 2021-04-27 ENCOUNTER — Emergency Department (HOSPITAL_COMMUNITY)
Admission: EM | Admit: 2021-04-27 | Discharge: 2021-04-28 | Disposition: A | Discharge status: Home / Self Care | Payer: Medicaid Other | Attending: Emergency Medicine | Admitting: Emergency Medicine

## 2021-04-27 ENCOUNTER — Emergency Department (HOSPITAL_COMMUNITY): Payer: Medicaid Other

## 2021-04-27 ENCOUNTER — Other Ambulatory Visit: Payer: Self-pay

## 2021-04-27 DIAGNOSIS — N83292 Other ovarian cyst, left side: Secondary | ICD-10-CM | POA: Insufficient documentation

## 2021-04-27 DIAGNOSIS — Z87891 Personal history of nicotine dependence: Secondary | ICD-10-CM | POA: Diagnosis not present

## 2021-04-27 DIAGNOSIS — Z85038 Personal history of other malignant neoplasm of large intestine: Secondary | ICD-10-CM | POA: Diagnosis not present

## 2021-04-27 DIAGNOSIS — Z8505 Personal history of malignant neoplasm of liver: Secondary | ICD-10-CM | POA: Diagnosis not present

## 2021-04-27 DIAGNOSIS — R102 Pelvic and perineal pain: Secondary | ICD-10-CM

## 2021-04-27 DIAGNOSIS — N83291 Other ovarian cyst, right side: Secondary | ICD-10-CM | POA: Insufficient documentation

## 2021-04-27 DIAGNOSIS — N83209 Unspecified ovarian cyst, unspecified side: Secondary | ICD-10-CM

## 2021-04-27 IMAGING — US US ART/VEN ABD/PELV/SCROTUM DOPPLER LTD
1 series · 7 of 7 positions shown · non-contrast
Comparison: [DATE]

CLINICAL DATA: Pelvic pain, vaginal bleeding, intrauterine device.

EXAM:
TRANSABDOMINAL AND TRANSVAGINAL ULTRASOUND OF PELVIS
DOPPLER ULTRASOUND OF OVARIES
TECHNIQUE: Both transabdominal and transvaginal ultrasound examinations of the
pelvis were performed. Transabdominal technique was performed for
global imaging of the pelvis including uterus, ovaries, adnexal
regions, and pelvic cul-de-sac.
It was necessary to proceed with endovaginal exam following the
transabdominal exam to visualize the endometrial cavity and left
ovary. Color and duplex Doppler ultrasound was utilized to evaluate
blood flow to the ovaries.

[Series 1: us art/ven abd/pelv/scrotum doppler ltd · 7 acquisitions, 7 frames shown]
[im 1/7]
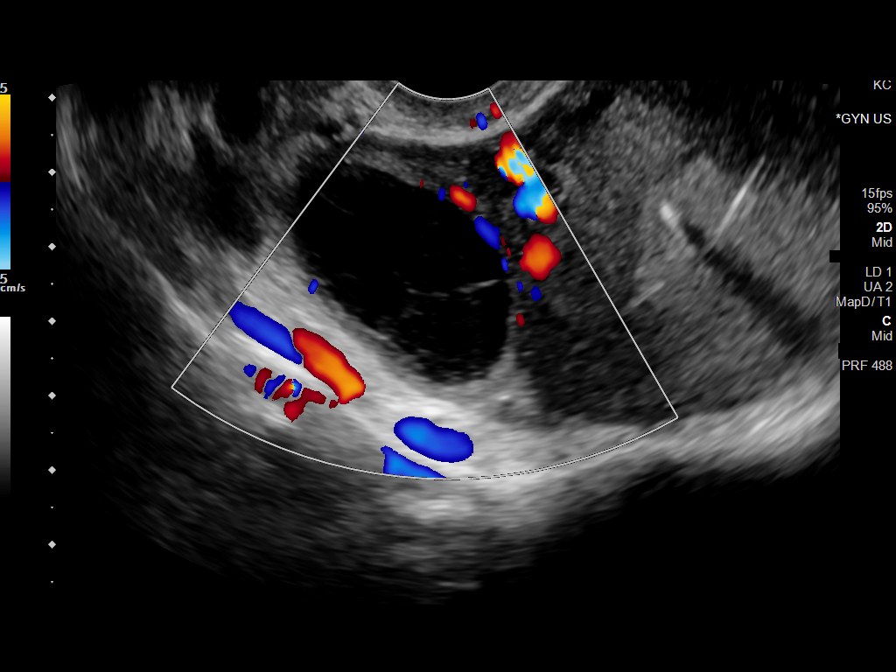
[im 2/7]
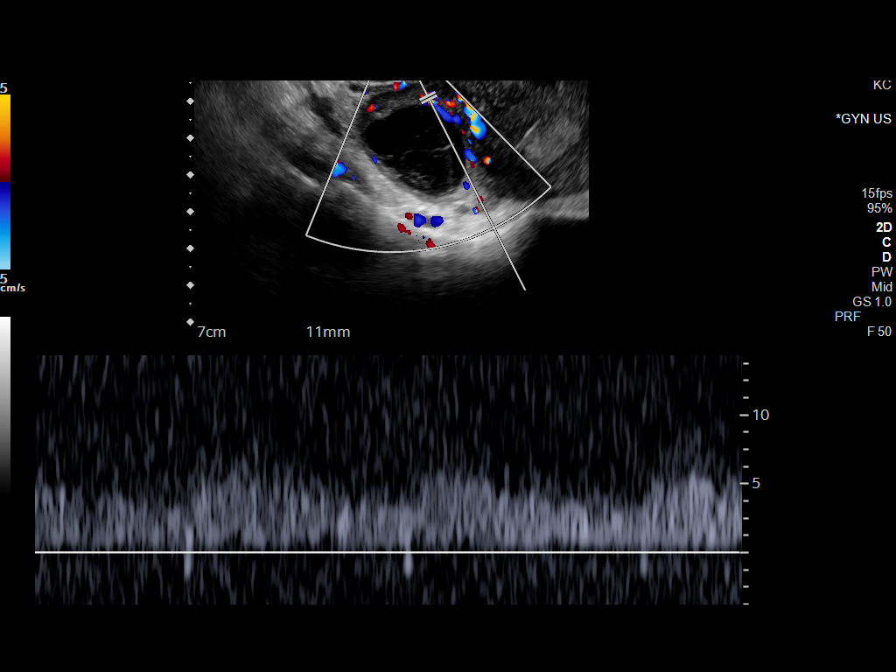
[im 3/7]
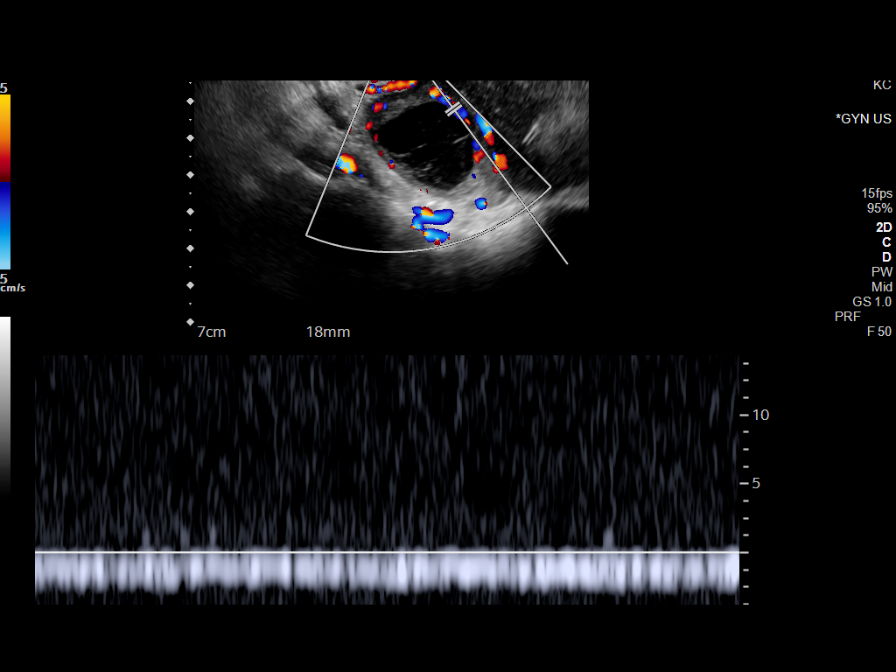
[im 4/7]
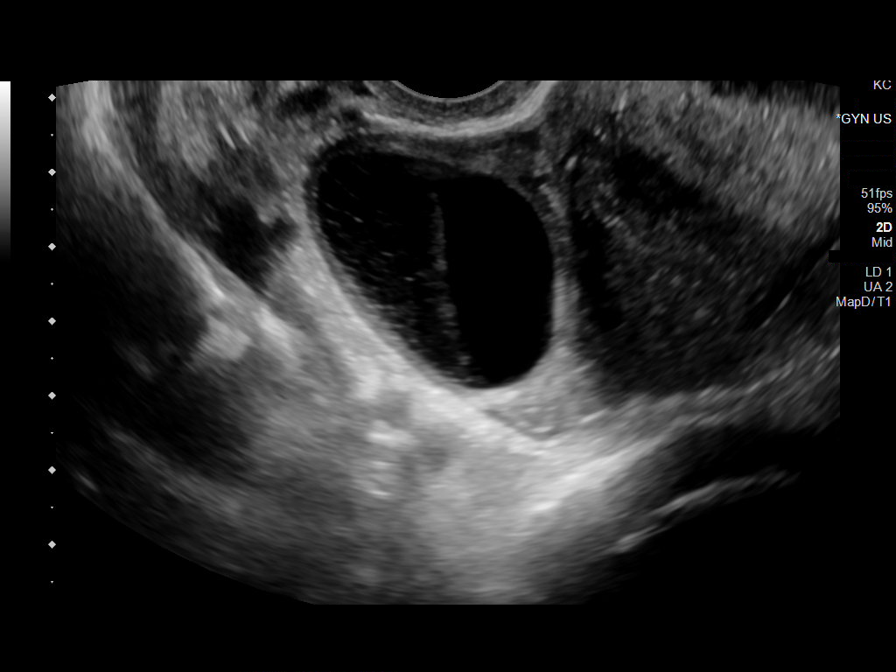
[im 5/7]
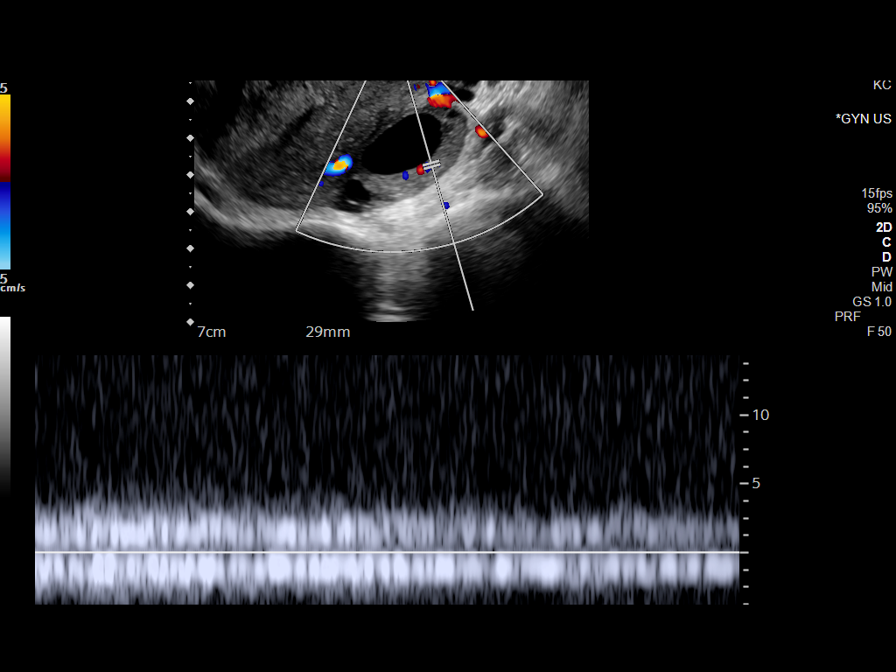
[im 6/7]
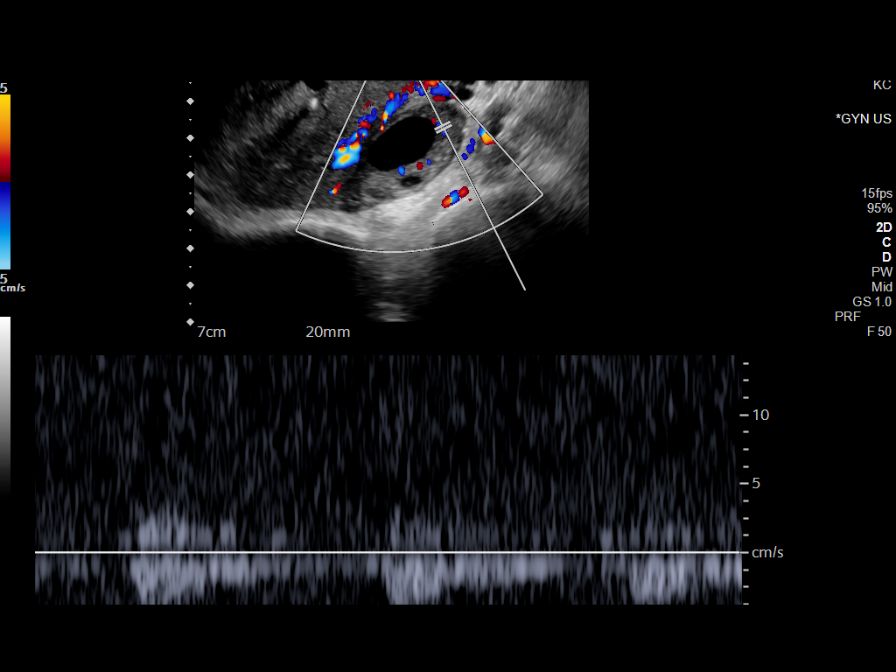
[im 7/7]
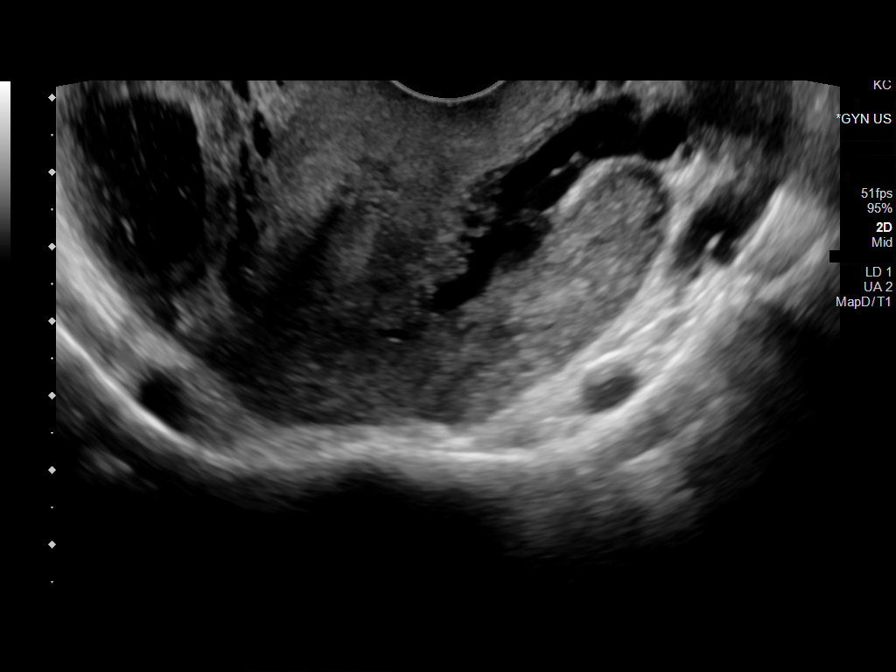

[7 of 7 positions shown; findings below may reference images not displayed]

FINDINGS: Uterus

Measurements: 9.2 x 4.9 x 5.3 cm = volume: 126 mL. The previously
noted intramural fundal fibroid noted anteriorly on prior
examination is not as well seen on this exam. No other intrauterine
masses are identified. The cervix is unremarkable.

Endometrium

An intrauterine device is in expected position within the
endometrial cavity. This obscures the endometrial stripe though this
does not appear thickened measuring 6 mm segmentally within the
fundus.

Right ovary

Measurements: 5.0 x 3.1 x 3.1 cm = volume: 25 mL. A 4.5 cm
hemorrhagic cyst is seen within the right ovary.

Left ovary

Measurements: 3.9 x 2.2 x 4.0 cm = volume: 19 mL. A 3.2 cm
hemorrhagic cyst is seen within the left ovary.

Pulsed Doppler evaluation of both ovaries demonstrates normal
low-resistance arterial and venous waveforms.

Other findings

No abnormal free fluid.
IMPRESSION: Intrauterine device in expected position within the endometrial
cavity.

Previously noted intramural fibroid is not as well seen on the
current examination.

Bilateral adnexal hemorrhagic cyst again identified. Further
follow-up is not required.

## 2021-04-27 IMAGING — US US PELVIS COMPLETE WITH TRANSVAGINAL
1 series · 13 of 25 positions shown · non-contrast
Comparison: [DATE]

CLINICAL DATA: Pelvic pain, vaginal bleeding, intrauterine device.

EXAM:
TRANSABDOMINAL AND TRANSVAGINAL ULTRASOUND OF PELVIS
DOPPLER ULTRASOUND OF OVARIES
TECHNIQUE: Both transabdominal and transvaginal ultrasound examinations of the
pelvis were performed. Transabdominal technique was performed for
global imaging of the pelvis including uterus, ovaries, adnexal
regions, and pelvic cul-de-sac.
It was necessary to proceed with endovaginal exam following the
transabdominal exam to visualize the endometrial cavity and left
ovary. Color and duplex Doppler ultrasound was utilized to evaluate
blood flow to the ovaries.

[Series 1: us pelvis complete with transvaginal · 13 of 64 slices shown]
[im 1/64]
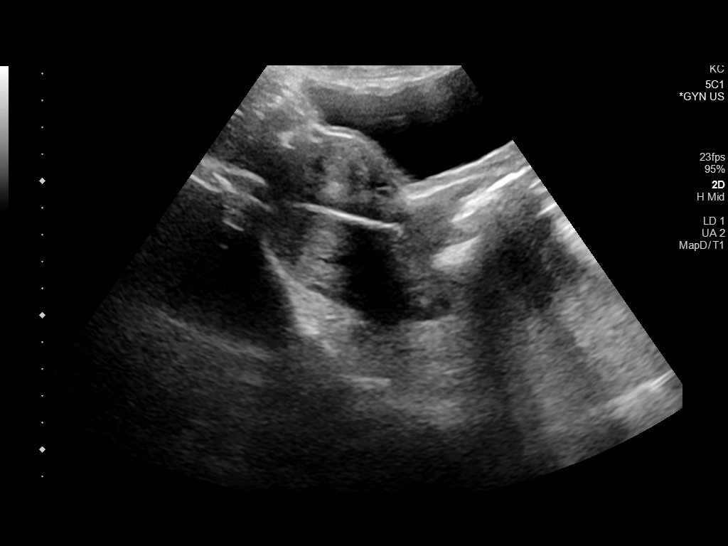
[im 6/64]
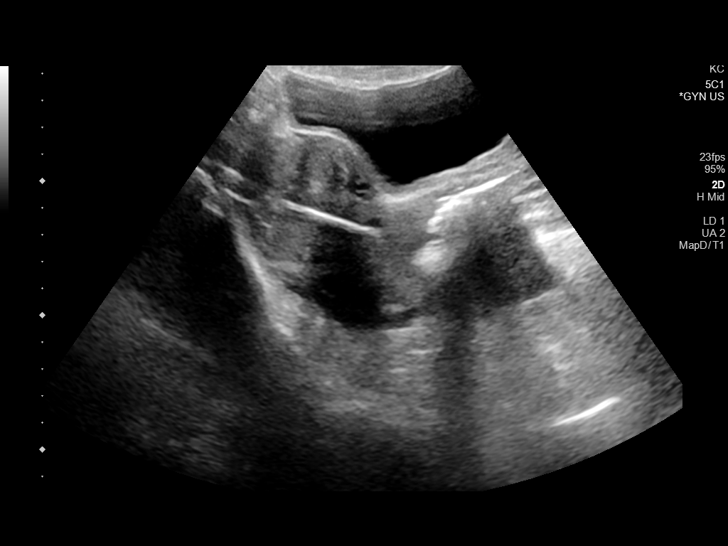
[im 11/64]
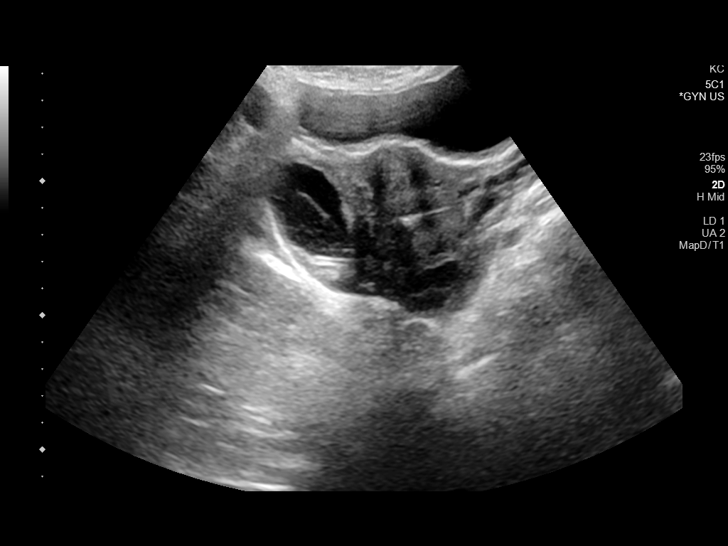
[im 16/64]
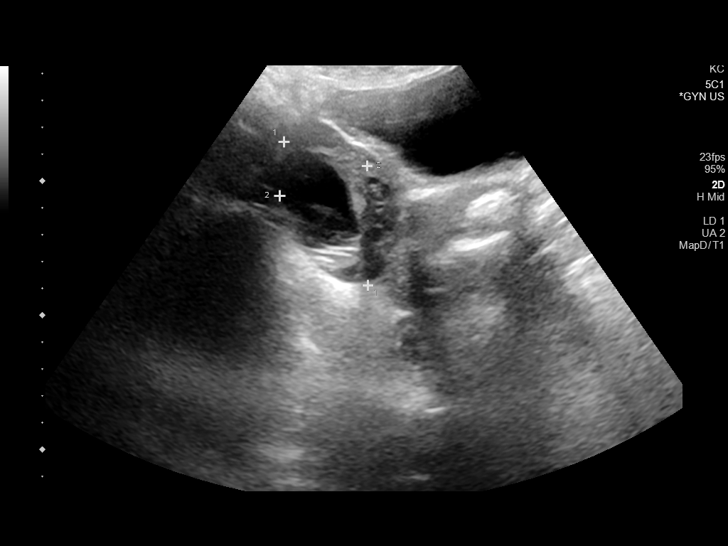
[im 22/64]
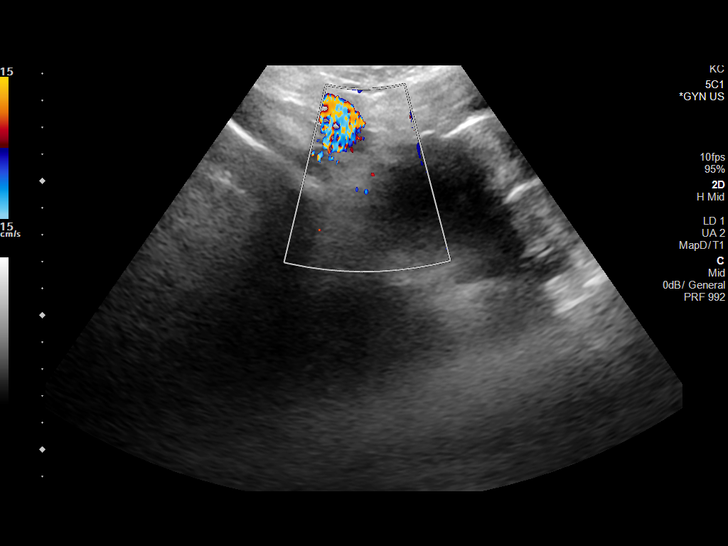
[im 27/64]
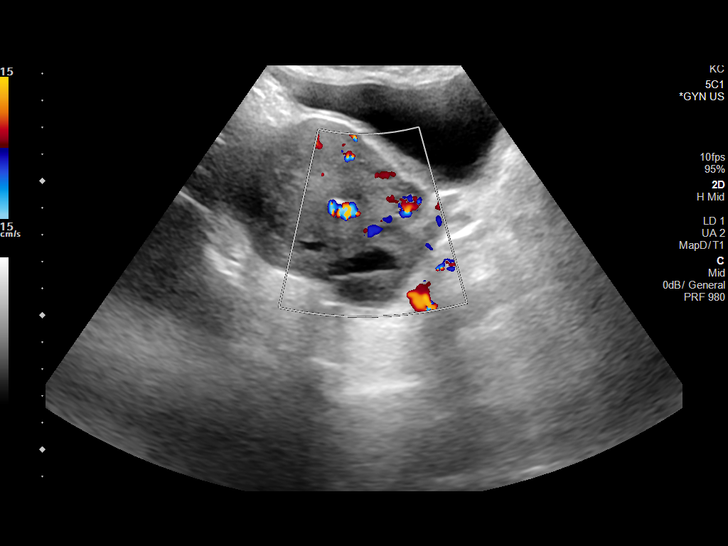
[im 32/64]
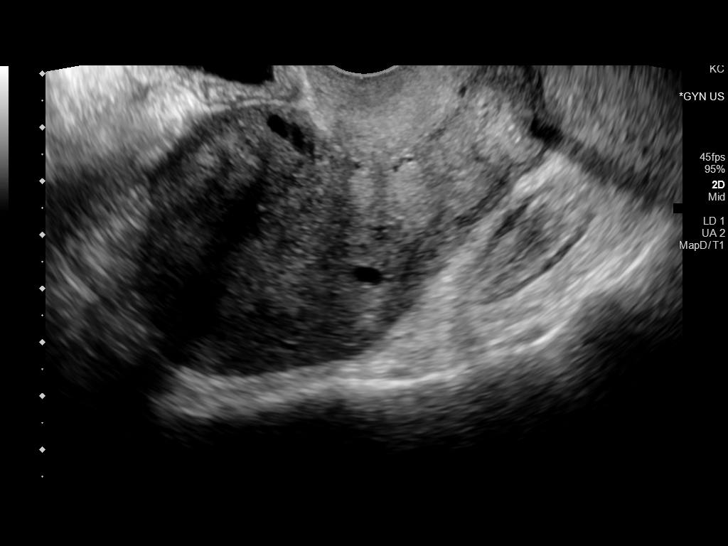
[im 37/64]
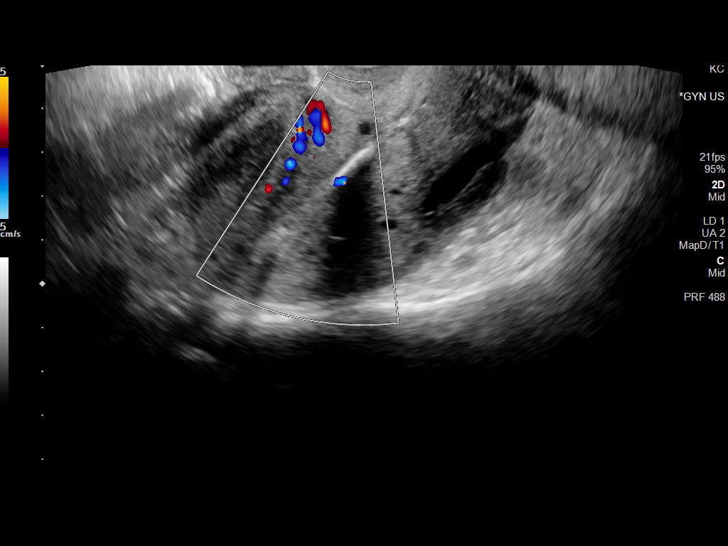
[im 43/64]
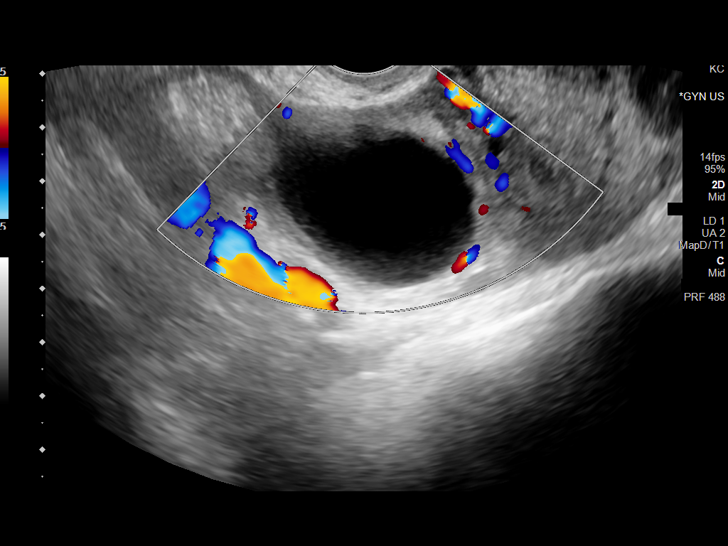
[im 48/64]
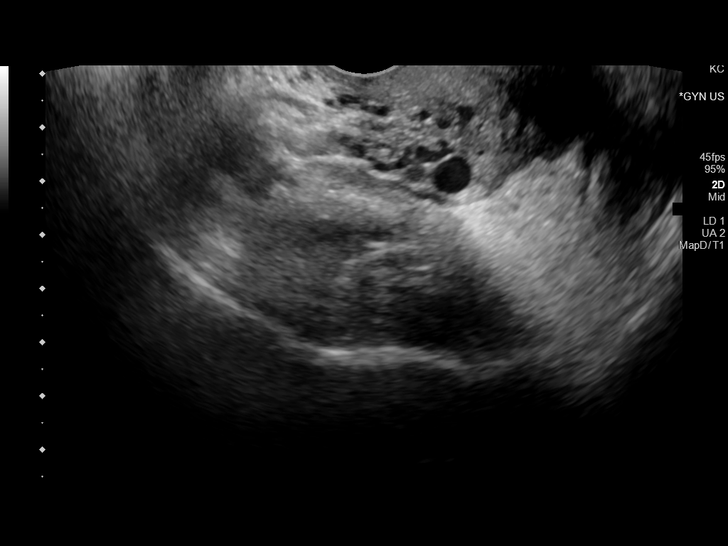
[im 53/64]
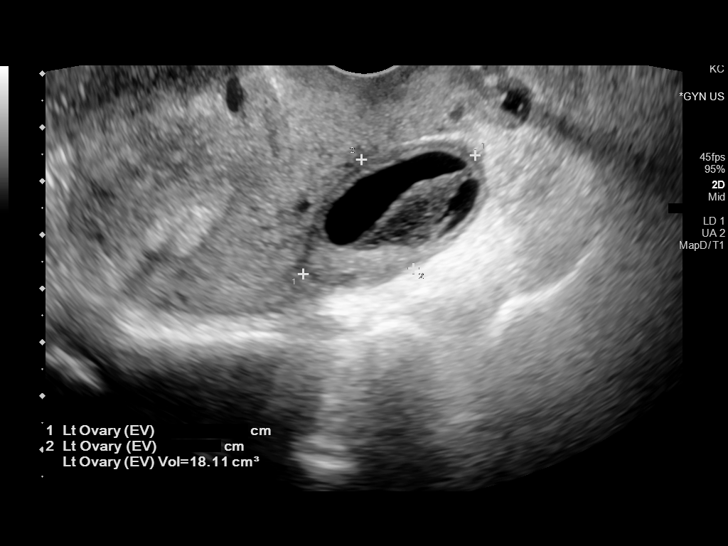
[im 58/64]
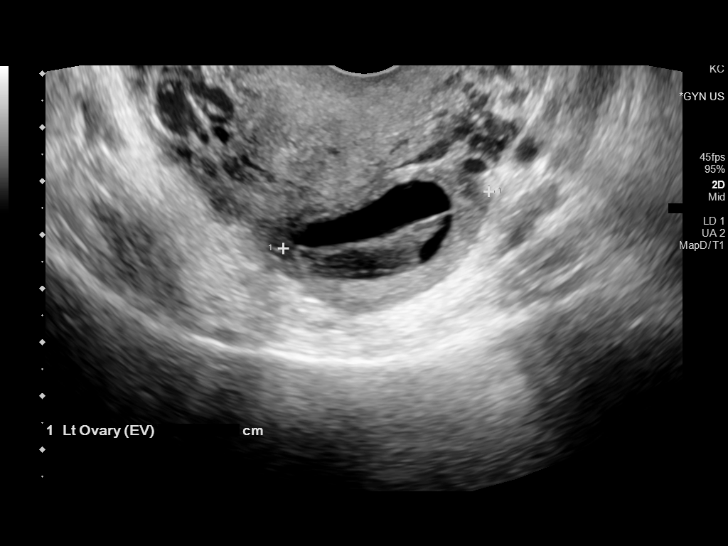
[im 64/64]
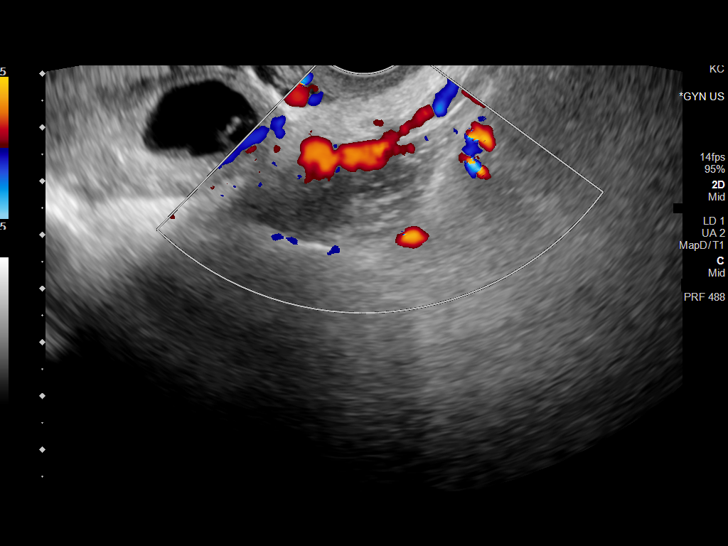

[13 of 25 positions shown; findings below may reference images not displayed]

FINDINGS: Uterus

Measurements: 9.2 x 4.9 x 5.3 cm = volume: 126 mL. The previously
noted intramural fundal fibroid noted anteriorly on prior
examination is not as well seen on this exam. No other intrauterine
masses are identified. The cervix is unremarkable.

Endometrium

An intrauterine device is in expected position within the
endometrial cavity. This obscures the endometrial stripe though this
does not appear thickened measuring 6 mm segmentally within the
fundus.

Right ovary

Measurements: 5.0 x 3.1 x 3.1 cm = volume: 25 mL. A 4.5 cm
hemorrhagic cyst is seen within the right ovary.

Left ovary

Measurements: 3.9 x 2.2 x 4.0 cm = volume: 19 mL. A 3.2 cm
hemorrhagic cyst is seen within the left ovary.

Pulsed Doppler evaluation of both ovaries demonstrates normal
low-resistance arterial and venous waveforms.

Other findings

No abnormal free fluid.
IMPRESSION: Intrauterine device in expected position within the endometrial
cavity.

Previously noted intramural fibroid is not as well seen on the
current examination.

Bilateral adnexal hemorrhagic cyst again identified. Further
follow-up is not required.

## 2021-04-27 MED ORDER — KETOROLAC TROMETHAMINE 30 MG/ML IJ SOLN
30.0000 mg | Freq: Once | INTRAMUSCULAR | Status: AC
  Administered 2021-04-27: 30 mg via INTRAVENOUS
  Filled 2021-04-27: qty 1

## 2021-04-27 MED ORDER — SODIUM CHLORIDE 0.9 % IV BOLUS
1000.0000 mL | Freq: Once | INTRAVENOUS | Status: AC
  Administered 2021-04-27: 1000 mL via INTRAVENOUS

## 2021-04-27 NOTE — ED Provider Notes (Signed)
[facility] DEPT Provider Note   CSN: [id number] Arrival date & time: 04/27/21  2141     History Chief Complaint  Patient presents with   Abdominal Pain    Stephanie [name] is a 40 y.o. female.  Patient here with past medical history notable for colon cancer, colon resection with colostomy, presents to the emergency department with a chief complaint of vaginal bleeding and pelvic cramps.  She reports that she had an ultrasound a few days ago which showed cysts and fibroid uterus.  She reports increased bleeding and increased pain.  She denies any fevers or chills.  Denies any dysuria or hematuria.  Denies nausea, vomiting, or diarrhea.  She has been taking ibuprofen with some relief.  She states that her OB/GYN told her to come to the emergency department if her symptoms worsened.  States she has been going through a couple of pads per day.  The history is provided by the patient. No language interpreter was used.      Past Medical History:  Diagnosis Date   Anxiety    Blood transfusion without reported diagnosis    BV (bacterial vaginosis)    Cancer ([place])    Colon Cancer 03-2020   Colostomy present [facility])    03-2020   Family history of bladder cancer    History of chemotherapy    ended 09-2020   Lactose intolerance 03/12/2020   Neuromuscular disorder ([facility])    neuropathy feet hands legs   UTI (lower urinary tract infection)     Patient Active Problem List   Diagnosis Date Noted   Colorectal carcinoma ([place]) 03/08/2021   Genetic testing 01/28/2021   Abdominal fluid collection 12/31/2020   Metastatic colon cancer to liver ([place]) 12/13/2020   Family history of bladder cancer    Port-A-Cath in place 05/03/2020   Sepsis ([place]) 04/04/2020   SOB (shortness of breath) 04/04/2020   Moderate protein-calorie malnutrition ([place]) 04/04/2020   Nausea & vomiting 04/04/2020   TOA (tubo-ovarian abscess)    Intra-abdominal abscess ([place]) 03/31/2020    Lactose intolerance 03/12/2020   Microcytic anemia 03/12/2020   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   Peritonitis with abscess of intestine ([place]) 03/12/2020   Recurrent urinary tract infection 06/01/2012   Contraception, device intrauterine 03/23/2012   Genital herpes simplex 02/09/2012   Attention deficit disorder 02/27/2010   Anxiety state 02/27/2010    Past Surgical History:  Procedure Laterality Date   CESAREAN SECTION     COLECTOMY  03/2020   COLONOSCOPY     CYSTOSCOPY WITH STENT PLACEMENT  04/04/2020   Procedure: CYSTOSCOPY WITH STENT PLACEMENT;  Surgeon: [name], MD;  Location: [facility];  Service: General;;   LAPAROSCOPIC LIVER ULTRASOUND N/A 12/13/2020   Procedure: INTRAOPERATIVE LIVER ULTRASOUND;  Surgeon: [name], MD;  Location: [facility];  Service: General;  Laterality: N/A;   LAPAROSCOPY N/A 12/13/2020   Procedure: LAPAROSCOPY DIAGNOSTIC;  Surgeon: [name], MD;  Location: [place];  Service: General;  Laterality: N/A;   LAPAROTOMY N/A 04/04/2020   Procedure: EXPLORATORY LAPAROTOMY;  Surgeon: [name], MD;  Location: [facility];  Service: General;  Laterality: N/A;   OPEN PARTIAL HEPATECTOMY  N/A 12/13/2020   Procedure: OPEN PARTIAL HEPATECTOMY;  Surgeon: [name], MD;  Location: [place];  Service: General;  Laterality: N/A;  ROOM 2 STARTING AT 09:30AM FOR 300 MIN   PORTACATH PLACEMENT Right 04/12/2020   Procedure: INSERTION PORT-A-CATH WITH ULTRASOUND;  Surgeon: Kieth Brightly Arta Bruce, MD;  Location: [facility];  Service: General;  Laterality: Right;     OB History     Gravida  3   Para  1   Term  1   Preterm      AB  1   Living  1      SAB      IAB  1   Ectopic      Multiple      Live Births              Family History  Problem Relation Age of Onset   Heart disease Father    Stroke Father    Aneurysm Father    Bladder Cancer Maternal Grandmother        dx 90s   Colon cancer Neg Hx    Esophageal cancer  Neg Hx    Colon polyps Neg Hx    Stomach cancer Neg Hx    Rectal cancer Neg Hx     Social History   Tobacco Use   Smoking status: Former    Packs/day: 0.50    Years: 10.00    Pack years: 5.00    Types: Cigarettes   Smokeless tobacco: Never  Vaping Use   Vaping Use: Never used  Substance Use Topics   Alcohol use: Yes    Comment: seldom   Drug use: No    Home Medications Prior to Admission medications   Medication Sig Start Date End Date Taking? Authorizing Provider  acetaminophen (TYLENOL) 325 MG tablet Take 650 mg by mouth daily as needed for fever or headache (pain).    [provider]  clindamycin (CLEOCIN) 2 % vaginal cream Place 1 Applicatorful vaginally at bedtime. For 3 days 04/09/21   Woodroe Mode, MD  clonazePAM (KLONOPIN) 0.5 MG tablet Take 1 tablet (0.5 mg total) by mouth daily as needed for anxiety. 04/11/21   [name], NP  Cyanocobalamin (B-12) 100 MCG TABS Take by mouth.    [provider]  etonogestrel-ethinyl estradiol (NUVARING) 0.12-0.015 MG/24HR vaginal ring Insert vaginally and leave in place for 3 consecutive weeks, then remove for 1 week. 02/28/21   Woodroe Mode, MD  ibuprofen (ADVIL) 200 MG tablet Take 800 mg by mouth every 6 (six) hours as needed for fever, headache or mild pain.    [provider]  Investigational palmitoylethanolamide/placebo 400 MG capsule [id number] Take 1 capsule by mouth twice daily for 8 weeks. One capsule (400 mg) in the morning and one capsule (400 mg) in the evening, take with food and swallow whole. Do not crush or open capsule. Store at room temperature. Keep container tightly closed. Patient not taking: No sig reported 03/14/21   [name], MD  metroNIDAZOLE (FLAGYL) 500 MG tablet Take 1 tablet (500 mg total) by mouth 2 (two) times daily. 03/27/21   Shelly Bombard, MD  milk thistle 175 MG tablet Take 175 mg by mouth daily.    [provider]  Multiple Vitamin (MULTIVITAMIN ADULT  PO) Take by mouth.    [provider]  Nutritional Supplements (ADULT NUTRITIONAL SUPPLEMENT PO) Take by mouth. Chlorella algae tablet daily    [provider]  PARAGARD INTRAUTERINE COPPER IU 1 Device by Intrauterine route once.     [provider]  polyethylene glycol (MIRALAX / GLYCOLAX) 17 g packet Take 17 g by mouth daily. PRN 01/27/21   [provider]  Probiotic Product (PROBIOTIC ADVANCED PO) Take by mouth.    [provider]  sulfamethoxazole-trimethoprim (BACTRIM DS) 800-160 MG tablet Take 1 tablet by mouth 2 (two) times daily. Patient not taking: No sig reported 03/25/21   Griffin Basil, MD    Allergies    Oxaliplatin  Review of Systems   Review of Systems  All other systems reviewed and are negative.  Physical Exam Updated Vital Signs BP 98/61 (BP Location: Left Arm)   Pulse (!) 54   Temp 97.9 F (36.6 C) (Oral)   Resp 18   Ht '5\' 9"'$  (1.753 m)   Wt 69.9 kg   LMP 04/15/2021 Comment: Patient states she has a nuva ring  SpO2 98%   BMI 22.74 kg/m   Physical Exam Vitals and nursing note reviewed.  Constitutional:      General: She is not in acute distress.    Appearance: She is well-developed.  HENT:     Head: Normocephalic and atraumatic.  Eyes:     Conjunctiva/sclera: Conjunctivae normal.  Cardiovascular:     Rate and Rhythm: Normal rate and regular rhythm.     Heart sounds: No murmur heard. Pulmonary:     Effort: Pulmonary effort is normal. No respiratory distress.     Breath sounds: Normal breath sounds.  Abdominal:     Palpations: Abdomen is soft.     Tenderness: There is no abdominal tenderness.     Comments: Colostomy in place  Musculoskeletal:        General: Normal range of motion.     Cervical back: Neck supple.  Skin:    General: Skin is warm and dry.  Neurological:     Mental Status: She is alert and oriented to person, place, and time.  Psychiatric:        Mood and Affect: Mood normal.         Behavior: Behavior normal.    ED Results / Procedures / Treatments   Labs (all labs ordered are listed, but only abnormal results are displayed) Labs Reviewed  CBC WITH DIFFERENTIAL/PLATELET  PROTIME-INR  COMPREHENSIVE METABOLIC PANEL  I-STAT BETA HCG BLOOD, ED ([facility], [facility], [facility] ONLY)  TYPE AND SCREEN    EKG None  Radiology [place] Art/Ven Flow Abd Pelv Doppler  Result Date: 04/28/2021 CLINICAL DATA:  Pelvic pain, vaginal bleeding, intrauterine device. EXAM: TRANSABDOMINAL AND TRANSVAGINAL ULTRASOUND OF PELVIS DOPPLER ULTRASOUND OF OVARIES TECHNIQUE: Both transabdominal and transvaginal ultrasound examinations of the pelvis were performed. Transabdominal technique was performed for global imaging of the pelvis including uterus, ovaries, adnexal regions, and pelvic cul-de-sac. It was necessary to proceed with endovaginal exam following the transabdominal exam to visualize the endometrial cavity and left ovary. Color and duplex Doppler ultrasound was utilized to evaluate blood flow to the ovaries. COMPARISON:  04/25/2021 FINDINGS: Uterus Measurements: 9.2 x 4.9 x 5.3 cm = volume: 126 mL. The previously noted intramural fundal fibroid noted anteriorly on prior examination is not as well seen on this exam. No other intrauterine masses are identified. The cervix is unremarkable. Endometrium An intrauterine device is in expected position within the endometrial cavity. This obscures the endometrial stripe though this does not appear thickened measuring 6 mm segmentally within the fundus. Right ovary Measurements: 5.0 x 3.1 x 3.1 cm = volume: 25 mL. A 4.5 cm hemorrhagic cyst is seen within the right ovary. Left ovary Measurements: 3.9 x 2.2 x 4.0 cm = volume: 19 mL. A 3.2 cm hemorrhagic cyst is seen within the left ovary. Pulsed Doppler evaluation of both ovaries demonstrates normal low-resistance arterial and venous waveforms. Other findings  No abnormal free fluid. IMPRESSION: Intrauterine device in expected  position within the endometrial cavity. Previously noted intramural fibroid is not as well seen on the current examination. Bilateral adnexal hemorrhagic cyst again identified. Further follow-up is not required. Electronically Signed   By: Fidela Salisbury MD   On: 04/28/2021 01:35   US PELVIC COMPLETE WITH TRANSVAGINAL  Result Date: 04/28/2021 CLINICAL DATA:  Pelvic pain, vaginal bleeding, intrauterine device. EXAM: TRANSABDOMINAL AND TRANSVAGINAL ULTRASOUND OF PELVIS DOPPLER ULTRASOUND OF OVARIES TECHNIQUE: Both transabdominal and transvaginal ultrasound examinations of the pelvis were performed. Transabdominal technique was performed for global imaging of the pelvis including uterus, ovaries, adnexal regions, and pelvic cul-de-sac. It was necessary to proceed with endovaginal exam following the transabdominal exam to visualize the endometrial cavity and left ovary. Color and duplex Doppler ultrasound was utilized to evaluate blood flow to the ovaries. COMPARISON:  04/25/2021 FINDINGS: Uterus Measurements: 9.2 x 4.9 x 5.3 cm = volume: 126 mL. The previously noted intramural fundal fibroid noted anteriorly on prior examination is not as well seen on this exam. No other intrauterine masses are identified. The cervix is unremarkable. Endometrium An intrauterine device is in expected position within the endometrial cavity. This obscures the endometrial stripe though this does not appear thickened measuring 6 mm segmentally within the fundus. Right ovary Measurements: 5.0 x 3.1 x 3.1 cm = volume: 25 mL. A 4.5 cm hemorrhagic cyst is seen within the right ovary. Left ovary Measurements: 3.9 x 2.2 x 4.0 cm = volume: 19 mL. A 3.2 cm hemorrhagic cyst is seen within the left ovary. Pulsed Doppler evaluation of both ovaries demonstrates normal low-resistance arterial and venous waveforms. Other findings No abnormal free fluid. IMPRESSION: Intrauterine device in expected position within the endometrial cavity. Previously  noted intramural fibroid is not as well seen on the current examination. Bilateral adnexal hemorrhagic cyst again identified. Further follow-up is not required. Electronically Signed   By: Fidela Salisbury MD   On: 04/28/2021 01:35    Procedures Procedures   Medications Ordered in ED Medications  sodium chloride 0.9 % bolus 1,000 mL (has no administration in time range)  ketorolac (TORADOL) 30 MG/ML injection 30 mg (has no administration in time range)    ED Course  I have reviewed the triage vital signs and the nursing notes.  Pertinent labs & imaging results that were available during my care of the patient were reviewed by me and considered in my medical decision making (see chart for details).    MDM Rules/Calculators/A&P                           Patient here with pelvic cramps and vaginal bleeding.  Symptoms have worsened over the past 2 days.  Will check labs and ultrasound.  Will reassess.  Hemoglobin is stable.  Pregnancy test negative.  Ultrasound shows again, hemorrhagic ovarian cysts, no evidence of torsion.  Will prescribe Mobic.  Recommend OB/GYN follow-up. Final Clinical Impression(s) / ED Diagnoses Final diagnoses:  Hemorrhagic ovarian cyst    Rx / DC Orders ED Discharge Orders     None        Montine Circle, PA-C 04/28/21 [id number]    Merryl Hacker, MD 04/28/21 [id number]

## 2021-04-27 NOTE — ED Notes (Signed)
PA at bedside.

## 2021-04-27 NOTE — ED Triage Notes (Signed)
Patient is complaining of lower abdominal pain and bleeding more than normal. Patient states she is having pain in her lower back.

## 2021-04-27 NOTE — ED Notes (Signed)
US at bedside

## 2021-04-28 LAB — CBC WITH DIFFERENTIAL/PLATELET
Abs Immature Granulocytes: 0.01 10*3/uL (ref 0.00–0.07)
Basophils Absolute: 0 10*3/uL (ref 0.0–0.1)
Basophils Relative: 0 %
Eosinophils Absolute: 0.1 10*3/uL (ref 0.0–0.5)
Eosinophils Relative: 2 %
HCT: 40.2 % (ref 36.0–46.0)
Hemoglobin: 13.4 g/dL (ref 12.0–15.0)
Immature Granulocytes: 0 %
Lymphocytes Relative: 36 %
Lymphs Abs: 1.4 10*3/uL (ref 0.7–4.0)
MCH: 30.8 pg (ref 26.0–34.0)
MCHC: 33.3 g/dL (ref 30.0–36.0)
MCV: 92.4 fL (ref 80.0–100.0)
Monocytes Absolute: 0.4 10*3/uL (ref 0.1–1.0)
Monocytes Relative: 9 %
Neutro Abs: 2.1 10*3/uL (ref 1.7–7.7)
Neutrophils Relative %: 53 %
Platelets: 121 10*3/uL — ABNORMAL LOW (ref 150–400)
RBC: 4.35 MIL/uL (ref 3.87–5.11)
RDW: 14.4 % (ref 11.5–15.5)
WBC: 4 10*3/uL (ref 4.0–10.5)
nRBC: 0 % (ref 0.0–0.2)

## 2021-04-28 LAB — COMPREHENSIVE METABOLIC PANEL
ALT: 18 U/L (ref 0–44)
AST: 19 U/L (ref 15–41)
Albumin: 3.7 g/dL (ref 3.5–5.0)
Alkaline Phosphatase: 55 U/L (ref 38–126)
Anion gap: 6 (ref 5–15)
BUN: 15 mg/dL (ref 6–20)
CO2: 25 mmol/L (ref 22–32)
Calcium: 8.8 mg/dL — ABNORMAL LOW (ref 8.9–10.3)
Chloride: 107 mmol/L (ref 98–111)
Creatinine, Ser: 0.62 mg/dL (ref 0.44–1.00)
GFR, Estimated: >60 mL/min (ref >60)
Glucose, Bld: 97 mg/dL (ref 70–99)
Potassium: 3.5 mmol/L (ref 3.5–5.1)
Sodium: 138 mmol/L (ref 135–145)
Total Bilirubin: 0.5 mg/dL (ref 0.3–1.2)
Total Protein: 6.2 g/dL — ABNORMAL LOW (ref 6.5–8.1)

## 2021-04-28 LAB — TYPE AND SCREEN
ABO/RH(D): O POS
Antibody Screen: NEGATIVE

## 2021-04-28 LAB — PROTIME-INR
INR: 0.9 (ref 0.8–1.2)
Prothrombin Time: 12.6 seconds (ref 11.4–15.2)

## 2021-04-28 LAB — HCG, QUANTITATIVE, PREGNANCY: hCG, Beta Chain, Quant, S: <1 m[IU]/mL (ref <5)

## 2021-04-28 MED ORDER — ACETAMINOPHEN 500 MG PO TABS
1000.0000 mg | ORAL_TABLET | Freq: Once | ORAL | Status: AC
  Administered 2021-04-28: 1000 mg via ORAL
  Filled 2021-04-28: qty 2

## 2021-04-28 MED ORDER — MELOXICAM 7.5 MG PO TABS: 7.5000 mg | ORAL_TABLET | Freq: Every day | ORAL | 0 refills | Status: DC

## 2021-04-28 NOTE — Discharge Instructions (Addendum)
You have bilateral hemorrhagic ovarian cysts.  This is thought to be the cause of your pain and bleeding.  Please follow-up with your OB/GYN.  Take medications as directed.  There was no significant change in the ultrasound compared to previous.

## 2021-04-28 NOTE — ED Notes (Addendum)
Patient upset due to lack of pain control. Patient is noted to be tearful. Rob, [place] made aware, and will speak with patient.

## 2021-04-29 ENCOUNTER — Telehealth: Payer: Self-pay

## 2021-04-29 NOTE — Telephone Encounter (Signed)
This nurse received a call from this patient today stating that she is still having issues with her ostomy leaking.  States that she is having to change 2-3 times a day and she is running out of supplies.  Patient attending [facility] on 7/18 and it was suggested that her supplies be changed however patient states that the order she recently received were the same supplies that she was receiving previously.  This nurse reached out to the [facility] to see if the orders can be resubmitted to [facility].

## 2021-05-01 ENCOUNTER — Other Ambulatory Visit: Payer: Self-pay | Admitting: Hematology

## 2021-05-01 ENCOUNTER — Telehealth: Payer: Self-pay

## 2021-05-01 ENCOUNTER — Encounter: Payer: Self-pay | Admitting: Hematology

## 2021-05-01 DIAGNOSIS — C19 Malignant neoplasm of rectosigmoid junction: Secondary | ICD-10-CM

## 2021-05-01 NOTE — Telephone Encounter (Signed)
I tried to call the patient to relay her CT Chest Abdomen Pelvis W Contrast appointment details. I received her VM, per DPR we are not allowed to leave any detailed information on her voicemail. I requested that the patient give [place] a call back so that we could give her the appointment details and information for the scan.  Patient is to arrive to the [facility] center on 05/27/21 by 7:15 am for her lab work and then report to [facility] by 8:15 am for her 8:30 am CT scan. Patient will need to stop by [facility] radiology prior to 8/29 in order to pick up her prep. NPO 4 hours prior to the scan. She will need to drink her first bottle at 6:30 am and the second bottle at 7:30 am.

## 2021-05-02 ENCOUNTER — Telehealth: Payer: Self-pay

## 2021-05-02 DIAGNOSIS — B711 Dipylidiasis: Secondary | ICD-10-CM

## 2021-05-02 DIAGNOSIS — B674 Echinococcus granulosus infection, unspecified: Secondary | ICD-10-CM

## 2021-05-02 NOTE — Telephone Encounter (Signed)
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  Then friends has contracted a Tape Worm infection and patient would like to know from Dr. Burr Medico if she should be treated for the infection as well based on her low immunity.  Patient also states that she has been scheduled for her Ostomy Reversal on 05/30/2021.  This information forwarded to MD.

## 2021-05-02 NOTE — Telephone Encounter (Signed)
Dr. Havery Moros patient with a hx of colorectal cancer that is scheduled for colostomy take down on 9/1.  Patient is requesting a O&P.  Her boyfriends dog has tapeworms and she is worried about exposure.  Would like to have a O&P prior to her surgery.  Discussed with Alonza Bogus, PA okay to order O&P.    Orders placed.  I left a detailed message for the patient that she can come to the lab at her convenience.

## 2021-05-03 ENCOUNTER — Other Ambulatory Visit: Payer: Medicaid Other

## 2021-05-03 DIAGNOSIS — B711 Dipylidiasis: Secondary | ICD-10-CM

## 2021-05-03 DIAGNOSIS — B674 Echinococcus granulosus infection, unspecified: Secondary | ICD-10-CM

## 2021-05-04 ENCOUNTER — Encounter (HOSPITAL_COMMUNITY): Payer: Self-pay | Admitting: Emergency Medicine

## 2021-05-04 ENCOUNTER — Other Ambulatory Visit: Payer: Self-pay

## 2021-05-04 ENCOUNTER — Emergency Department (HOSPITAL_COMMUNITY)
Admission: EM | Admit: 2021-05-04 | Discharge: 2021-05-05 | Disposition: A | Discharge status: Home / Self Care | Payer: Medicaid Other | Attending: Emergency Medicine | Admitting: Emergency Medicine

## 2021-05-04 DIAGNOSIS — Z87891 Personal history of nicotine dependence: Secondary | ICD-10-CM | POA: Diagnosis not present

## 2021-05-04 DIAGNOSIS — R519 Headache, unspecified: Secondary | ICD-10-CM | POA: Insufficient documentation

## 2021-05-04 DIAGNOSIS — K921 Melena: Secondary | ICD-10-CM

## 2021-05-04 DIAGNOSIS — Z20822 Contact with and (suspected) exposure to covid-19: Secondary | ICD-10-CM | POA: Insufficient documentation

## 2021-05-04 DIAGNOSIS — Z85038 Personal history of other malignant neoplasm of large intestine: Secondary | ICD-10-CM | POA: Insufficient documentation

## 2021-05-04 DIAGNOSIS — R1032 Left lower quadrant pain: Secondary | ICD-10-CM | POA: Diagnosis present

## 2021-05-04 NOTE — ED Triage Notes (Signed)
Pt c/o bloody stool in ostomy bag that began today. Pt received booster vaccine yesterday and today feels weak. Unsure if its related to vaccine.

## 2021-05-05 ENCOUNTER — Encounter (HOSPITAL_COMMUNITY): Payer: Self-pay

## 2021-05-05 ENCOUNTER — Emergency Department (HOSPITAL_COMMUNITY): Payer: Medicaid Other

## 2021-05-05 LAB — CBC WITH DIFFERENTIAL/PLATELET
Abs Immature Granulocytes: 0.01 10*3/uL (ref 0.00–0.07)
Basophils Absolute: 0 10*3/uL (ref 0.0–0.1)
Basophils Relative: 0 %
Eosinophils Absolute: 0.1 10*3/uL (ref 0.0–0.5)
Eosinophils Relative: 1 %
HCT: 37.7 % (ref 36.0–46.0)
Hemoglobin: 12.6 g/dL (ref 12.0–15.0)
Immature Granulocytes: 0 %
Lymphocytes Relative: 21 %
Lymphs Abs: 0.8 10*3/uL (ref 0.7–4.0)
MCH: 31 pg (ref 26.0–34.0)
MCHC: 33.4 g/dL (ref 30.0–36.0)
MCV: 92.6 fL (ref 80.0–100.0)
Monocytes Absolute: 0.3 10*3/uL (ref 0.1–1.0)
Monocytes Relative: 9 %
Neutro Abs: 2.4 10*3/uL (ref 1.7–7.7)
Neutrophils Relative %: 69 %
Platelets: 75 10*3/uL — ABNORMAL LOW (ref 150–400)
RBC: 4.07 MIL/uL (ref 3.87–5.11)
RDW: 14.1 % (ref 11.5–15.5)
WBC: 3.5 10*3/uL — ABNORMAL LOW (ref 4.0–10.5)
nRBC: 0 % (ref 0.0–0.2)

## 2021-05-05 LAB — RESP PANEL BY RT-PCR (FLU A&B, COVID) ARPGX2
Influenza A by PCR: NEGATIVE
Influenza B by PCR: NEGATIVE
SARS Coronavirus 2 by RT PCR: NEGATIVE

## 2021-05-05 LAB — COMPREHENSIVE METABOLIC PANEL
ALT: 17 U/L (ref 0–44)
AST: 19 U/L (ref 15–41)
Albumin: 3.5 g/dL (ref 3.5–5.0)
Alkaline Phosphatase: 42 U/L (ref 38–126)
Anion gap: 7 (ref 5–15)
BUN: 7 mg/dL (ref 6–20)
CO2: 24 mmol/L (ref 22–32)
Calcium: 8.5 mg/dL — ABNORMAL LOW (ref 8.9–10.3)
Chloride: 106 mmol/L (ref 98–111)
Creatinine, Ser: 0.61 mg/dL (ref 0.44–1.00)
GFR, Estimated: >60 mL/min (ref >60)
Glucose, Bld: 87 mg/dL (ref 70–99)
Potassium: 3.7 mmol/L (ref 3.5–5.1)
Sodium: 137 mmol/L (ref 135–145)
Total Bilirubin: 0.9 mg/dL (ref 0.3–1.2)
Total Protein: 6.1 g/dL — ABNORMAL LOW (ref 6.5–8.1)

## 2021-05-05 IMAGING — CT CT ABD-PELV W/ CM
2 of 5 series · 15 of 46 positions shown, 17 images · IV contrast (omnipaque)
Comparison: [DATE]

CLINICAL DATA: Lower GI bleed

EXAM:
CT ABDOMEN AND PELVIS WITH CONTRAST
TECHNIQUE: Multidetector CT imaging of the abdomen and pelvis was performed
using the standard protocol following bolus administration of
intravenous contrast.
CONTRAST:  80mL OMNIPAQUE IOHEXOL 350 MG/ML SOLN

[Series 2: axial st · axial · 0.69mm/px · z∈[-550,-124]mm · 12 of 99 slices shown, 14 images]
[im 7/99  soft-tissue]
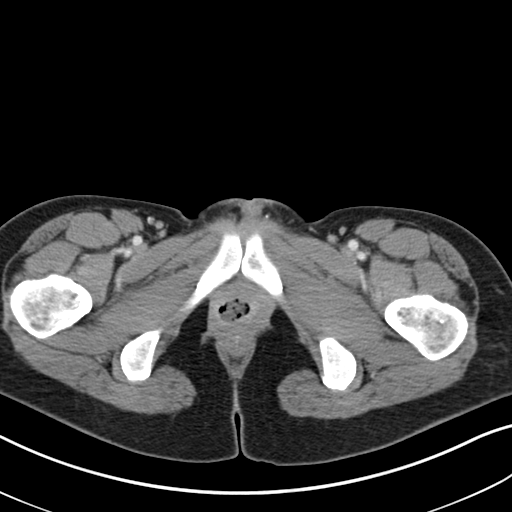
[im 7/99  bone]
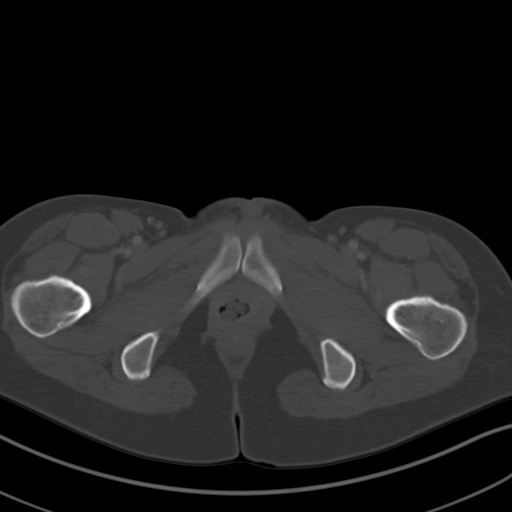
[im 14/99  soft-tissue]
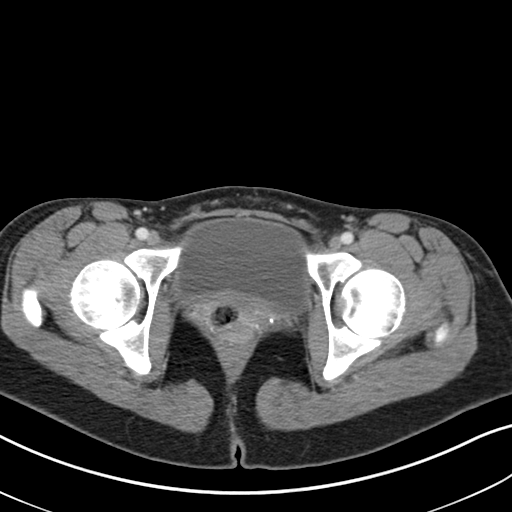
[im 20/99  soft-tissue]
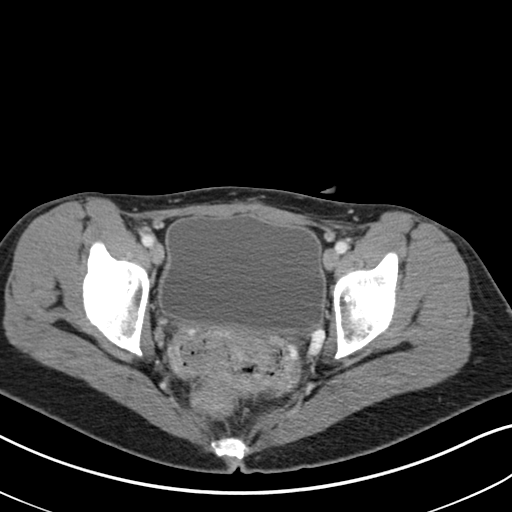
[im 33/99  soft-tissue]
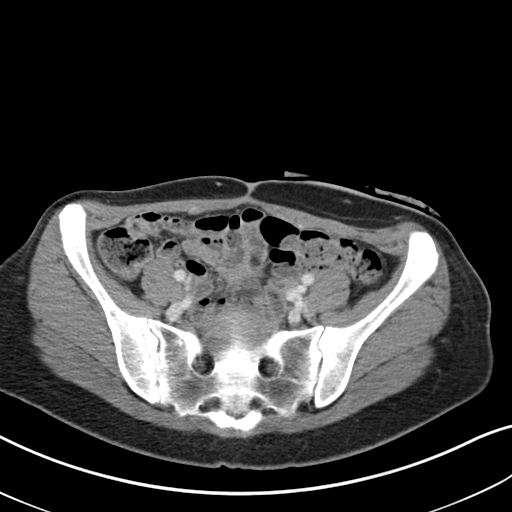
[im 40/99  soft-tissue]
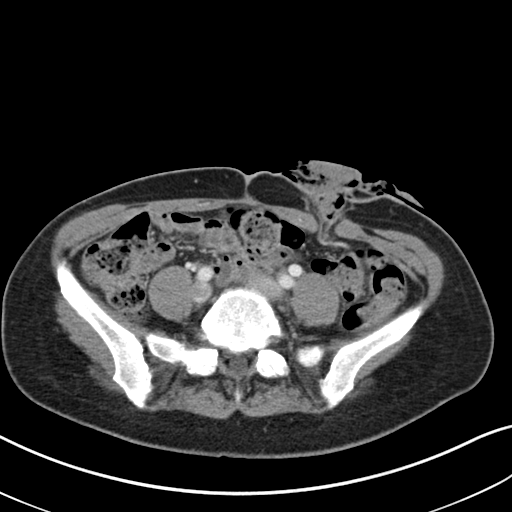
[im 46/99  soft-tissue]
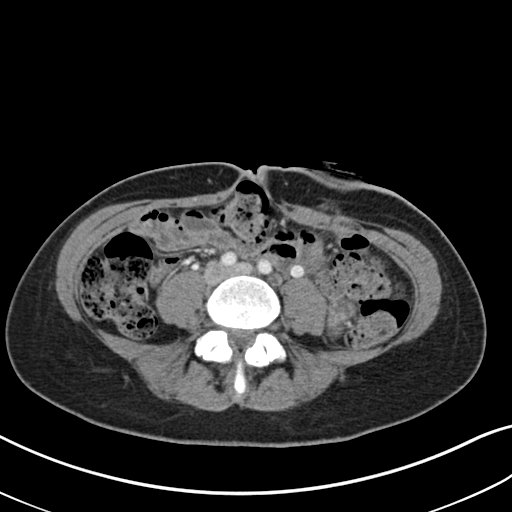
[im 53/99  soft-tissue]
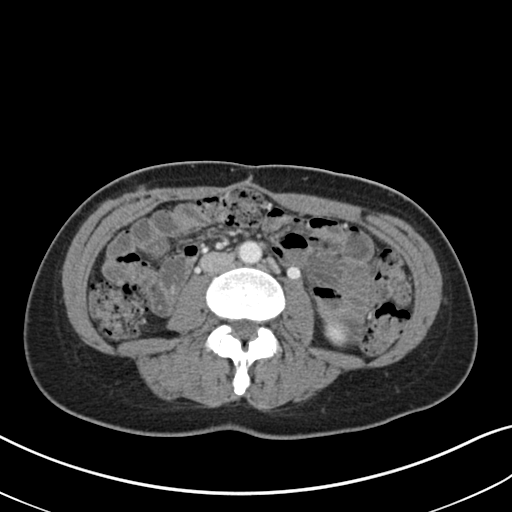
[im 59/99  soft-tissue]
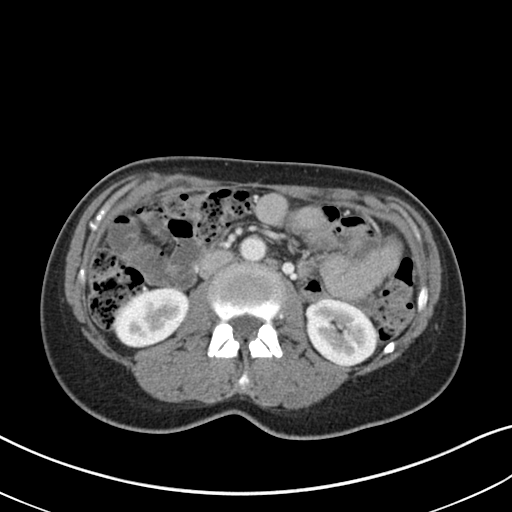
[im 66/99  soft-tissue]
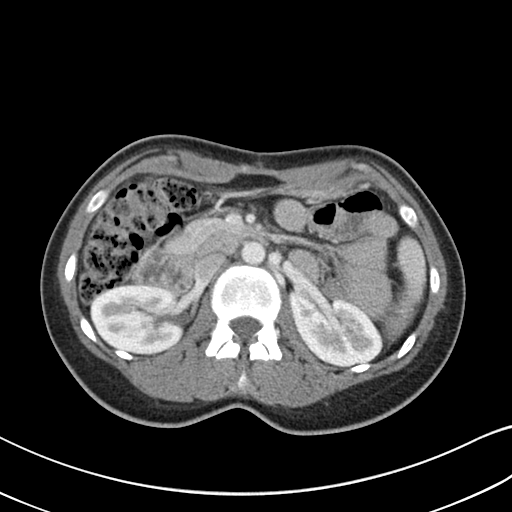
[im 66/99  bone]
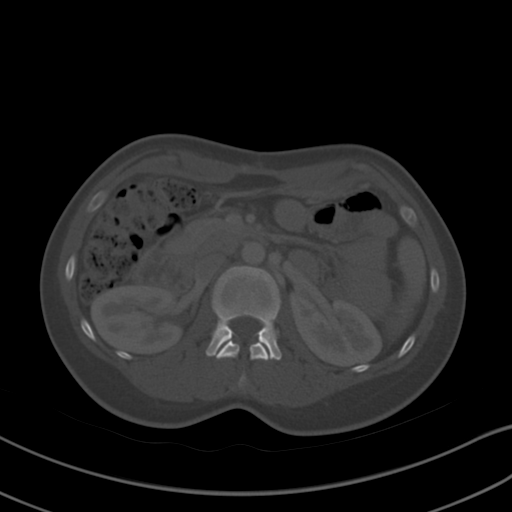
[im 79/99  soft-tissue]
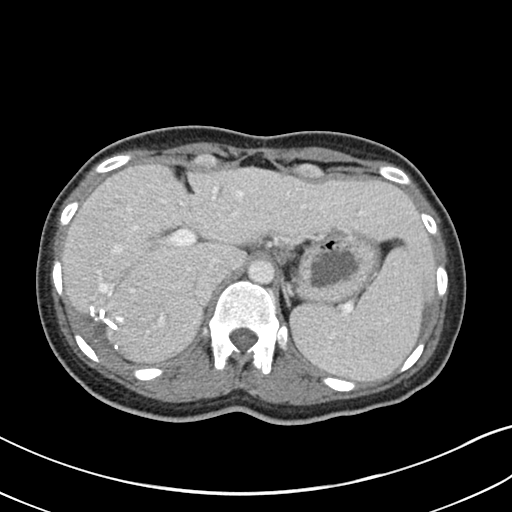
[im 85/99  soft-tissue]
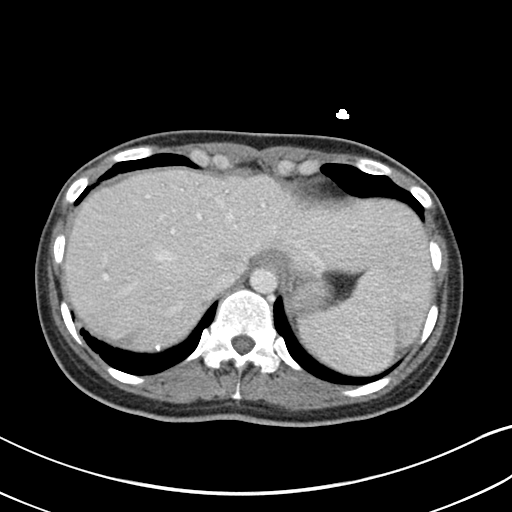
[im 92/99  soft-tissue]
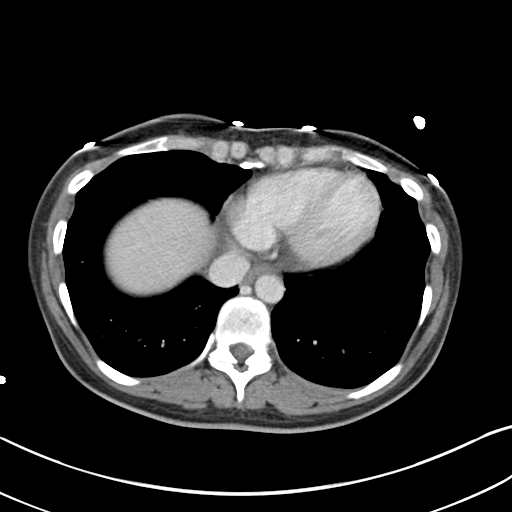

[Series 5: coronal st · coronal · 0.69mm/px · 3 of 131 slices shown]
[im 44/131  soft-tissue]
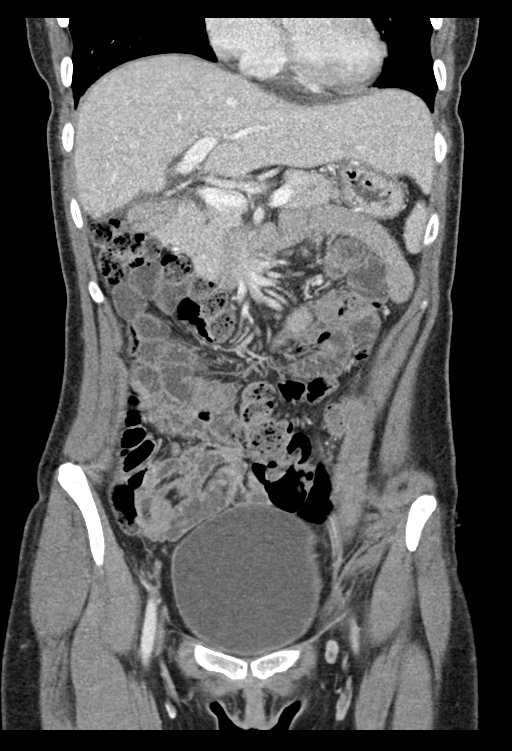
[im 58/131  soft-tissue]
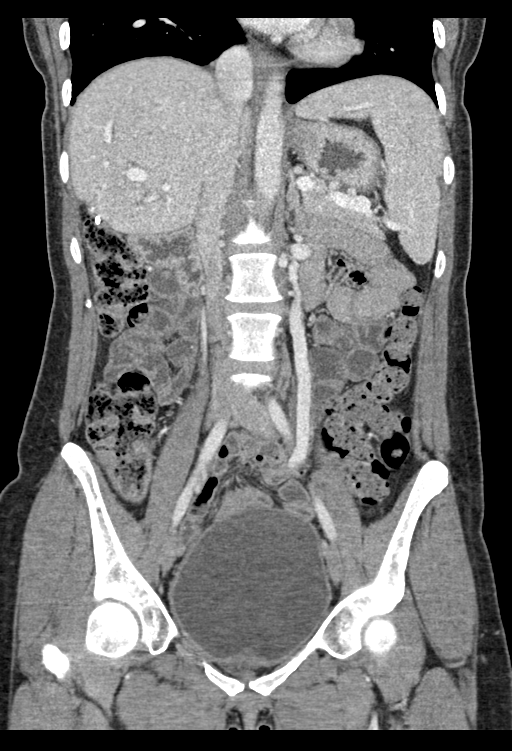
[im 73/131  soft-tissue]
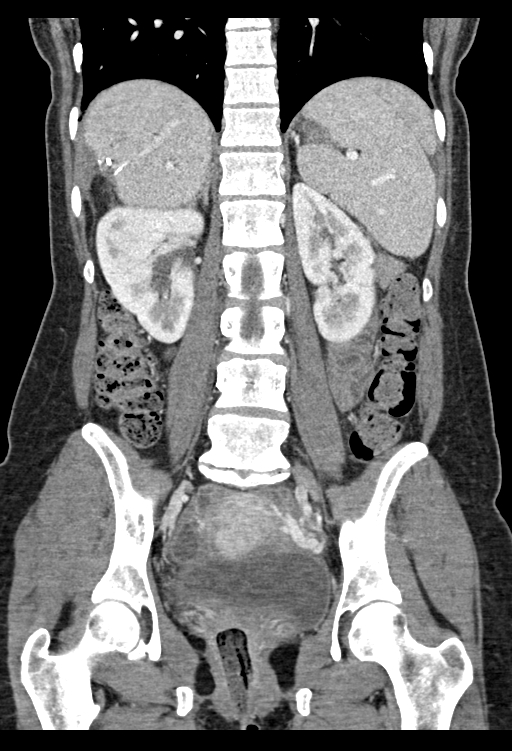

[15 of 46 positions shown; findings below may reference images not displayed]

FINDINGS: Lower chest: No acute abnormality.

Hepatobiliary: Postsurgical changes are noted in the posterior
aspect of the right lobe of the liver consistent with prior partial
hepatectomy. Gallbladder has been surgically as well.

Pancreas: Unremarkable. No pancreatic ductal dilatation or
surrounding inflammatory changes.

Spleen: Normal in size without focal abnormality.

Adrenals/Urinary Tract: Adrenal glands are within normal limits.
Kidneys demonstrate a normal enhancement pattern bilaterally. No
renal calculi or obstructive changes are seen. Normal excretion is
noted on delayed images. The ureters are within normal limits. The
bladder is well distended.

Stomach/Bowel: Hartmann's pouch is noted. There is a left mid
abdominal ostomy is similar to that seen on the prior exam. More
proximal colon appears within normal limits. Small bowel and stomach
are unremarkable.

Vascular/Lymphatic: No significant vascular findings are present. No
enlarged abdominal or pelvic lymph nodes.

Reproductive: Uterus is within normal limits. IUD is noted in place.
Multiple prominent venous channels are noted surrounding the uterus
likely related to a degree of pelvic varices.

Other: No abdominal wall hernia or abnormality. No abdominopelvic
ascites.

Musculoskeletal: No acute or significant osseous findings.
IMPRESSION: Postsurgical changes as described stable in appearance from the
prior exam.

Changes suggestive of mild pelvic varices.

## 2021-05-05 MED ORDER — DIPHENHYDRAMINE HCL 50 MG/ML IJ SOLN
12.5000 mg | Freq: Once | INTRAMUSCULAR | Status: AC
  Administered 2021-05-05: 12.5 mg via INTRAVENOUS
  Filled 2021-05-05: qty 1

## 2021-05-05 MED ORDER — MORPHINE SULFATE (PF) 4 MG/ML IV SOLN
INTRAVENOUS | Status: AC
  Filled 2021-05-05: qty 1

## 2021-05-05 MED ORDER — IOHEXOL 350 MG/ML SOLN
80.0000 mL | Freq: Once | INTRAVENOUS | Status: AC | PRN
  Administered 2021-05-05: 80 mL via INTRAVENOUS

## 2021-05-05 MED ORDER — METOCLOPRAMIDE HCL 5 MG/ML IJ SOLN
10.0000 mg | Freq: Once | INTRAMUSCULAR | Status: AC
  Administered 2021-05-05: 10 mg via INTRAVENOUS
  Filled 2021-05-05: qty 2

## 2021-05-05 MED ORDER — MORPHINE SULFATE (PF) 4 MG/ML IV SOLN
4.0000 mg | Freq: Once | INTRAVENOUS | Status: AC
  Administered 2021-05-05: 4 mg via INTRAVENOUS

## 2021-05-05 NOTE — Discharge Instructions (Addendum)
Follow up with gastroenterology for recheck next week. Keep track of how much bleeding there is to be able to report that to Dr. Havery Moros.   Return to the ED with any new or concerning symptoms.

## 2021-05-05 NOTE — ED Provider Notes (Signed)
[facility] DEPT Provider Note   CSN: [id number] Arrival date & time: 05/04/21  2034     History No chief complaint on file.   Stephanie [name] is a 40 y.o. female.  Patient with history colon cancer, s/p chemo, in remission presents with BRB in colostomy collection bag today. She is having some abdominal pain surrounding stoma in the LLQ. No nausea, vomiting, fever. She reports normal colonoscopy in June and is very concerned the bleeding represents a return of her cancer. No urinary symptoms.   The history is provided by the patient. No language interpreter was used.      Past Medical History:  Diagnosis Date   Anxiety    Blood transfusion without reported diagnosis    BV (bacterial vaginosis)    Cancer ([place])    Colon Cancer 03-2020   Colostomy present [facility])    03-2020   Family history of bladder cancer    History of chemotherapy    ended 09-2020   Lactose intolerance 03/12/2020   Neuromuscular disorder (HCC)    neuropathy feet hands legs   UTI (lower urinary tract infection)     Patient Active Problem List   Diagnosis Date Noted   Colorectal carcinoma ([place]) 03/08/2021   Genetic testing 01/28/2021   Abdominal fluid collection 12/31/2020   Metastatic colon cancer to liver ([place]) 12/13/2020   Family history of bladder cancer    Port-A-Cath in place 05/03/2020   Sepsis ([place]) 04/04/2020   SOB (shortness of breath) 04/04/2020   Moderate protein-calorie malnutrition ([place]) 04/04/2020   Nausea & vomiting 04/04/2020   TOA (tubo-ovarian abscess)    Intra-abdominal abscess ([place]) 03/31/2020   Lactose intolerance 03/12/2020   Microcytic anemia 03/12/2020   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   Peritonitis with abscess of intestine ([place]) 03/12/2020   Recurrent urinary tract infection 06/01/2012   Contraception, device intrauterine 03/23/2012   Genital herpes simplex 02/09/2012   Attention deficit disorder 02/27/2010    Anxiety state 02/27/2010    Past Surgical History:  Procedure Laterality Date   CESAREAN SECTION     COLECTOMY  03/2020   COLONOSCOPY     CYSTOSCOPY WITH STENT PLACEMENT  04/04/2020   Procedure: CYSTOSCOPY WITH STENT PLACEMENT;  Surgeon: [name], MD;  Location: [facility];  Service: General;;   LAPAROSCOPIC LIVER ULTRASOUND N/A 12/13/2020   Procedure: INTRAOPERATIVE LIVER ULTRASOUND;  Surgeon: [name], MD;  Location: [facility];  Service: General;  Laterality: N/A;   LAPAROSCOPY N/A 12/13/2020   Procedure: LAPAROSCOPY DIAGNOSTIC;  Surgeon: [name], MD;  Location: [facility];  Service: General;  Laterality: N/A;   LAPAROTOMY N/A 04/04/2020   Procedure: EXPLORATORY LAPAROTOMY;  Surgeon: [name], MD;  Location: [facility];  Service: General;  Laterality: N/A;   OPEN PARTIAL HEPATECTOMY  N/A 12/13/2020   Procedure: OPEN PARTIAL HEPATECTOMY;  Surgeon: [name], MD;  Location: [facility];  Service: General;  Laterality: N/A;  ROOM 2 STARTING AT 09:30AM FOR 300 MIN   PORTACATH PLACEMENT Right 04/12/2020   Procedure: INSERTION PORT-A-CATH WITH ULTRASOUND;  Surgeon: Kinsinger, Arta Bruce, MD;  Location: [facility];  Service: General;  Laterality: Right;     OB History     Gravida  3   Para  1   Term  1   Preterm      AB  1   Living  1      SAB      IAB  1   Ectopic  Multiple      Live Births              Family History  Problem Relation Age of Onset   Heart disease Father    Stroke Father    Aneurysm Father    Bladder Cancer Maternal Grandmother        dx 90s   Colon cancer Neg Hx    Esophageal cancer Neg Hx    Colon polyps Neg Hx    Stomach cancer Neg Hx    Rectal cancer Neg Hx     Social History   Tobacco Use   Smoking status: Former    Packs/day: 0.50    Years: 10.00    Pack years: 5.00    Types: Cigarettes   Smokeless tobacco: Never  Vaping Use   Vaping Use: Never used  Substance Use Topics   Alcohol use: Yes    Comment: seldom    Drug use: No    Home Medications Prior to Admission medications   Medication Sig Start Date End Date Taking? Authorizing Provider  acetaminophen (TYLENOL) 325 MG tablet Take 650 mg by mouth daily as needed for fever or headache (pain).    [provider]  clindamycin (CLEOCIN) 2 % vaginal cream Place 1 Applicatorful vaginally at bedtime. For 3 days 04/09/21   Woodroe Mode, MD  clonazePAM (KLONOPIN) 0.5 MG tablet Take 1 tablet (0.5 mg total) by mouth daily as needed for anxiety. 04/11/21   [name], NP  Cyanocobalamin (B-12) 100 MCG TABS Take by mouth.    [provider]  etonogestrel-ethinyl estradiol (NUVARING) 0.12-0.015 MG/24HR vaginal ring Insert vaginally and leave in place for 3 consecutive weeks, then remove for 1 week. 02/28/21   Woodroe Mode, MD  ibuprofen (ADVIL) 200 MG tablet Take 800 mg by mouth every 6 (six) hours as needed for fever, headache or mild pain.    [provider]  Investigational palmitoylethanolamide/placebo 400 MG capsule [id number] Take 1 capsule by mouth twice daily for 8 weeks. One capsule (400 mg) in the morning and one capsule (400 mg) in the evening, take with food and swallow whole. Do not crush or open capsule. Store at room temperature. Keep container tightly closed. Patient not taking: No sig reported 03/14/21   [name], MD  meloxicam (MOBIC) 7.5 MG tablet Take 1 tablet (7.5 mg total) by mouth daily. 04/28/21   Montine Circle, PA-C  metroNIDAZOLE (FLAGYL) 500 MG tablet Take 1 tablet (500 mg total) by mouth 2 (two) times daily. 03/27/21   Shelly Bombard, MD  milk thistle 175 MG tablet Take 175 mg by mouth daily.    [provider]  Multiple Vitamin (MULTIVITAMIN ADULT PO) Take by mouth.    [provider]  Nutritional Supplements (ADULT NUTRITIONAL SUPPLEMENT PO) Take by mouth. Chlorella algae tablet daily    [provider]  PARAGARD INTRAUTERINE COPPER IU 1 Device by Intrauterine route  once.     [provider]  polyethylene glycol (MIRALAX / GLYCOLAX) 17 g packet Take 17 g by mouth daily. PRN 01/27/21   [provider]  Probiotic Product (PROBIOTIC ADVANCED PO) Take by mouth.    [provider]  sulfamethoxazole-trimethoprim (BACTRIM DS) 800-160 MG tablet Take 1 tablet by mouth 2 (two) times daily. Patient not taking: No sig reported 03/25/21   Griffin Basil, MD    Allergies    Oxaliplatin  Review of Systems   Review of Systems  Constitutional:  Negative for fever.  HENT: Negative.    Respiratory: Negative.    Cardiovascular: Negative.   Gastrointestinal:  Positive for abdominal pain and blood in stool. Negative for nausea and vomiting.  Genitourinary: Negative.   Musculoskeletal: Negative.   Neurological:  Negative for weakness.   Physical Exam Updated Vital Signs BP 118/85   Pulse 60   Temp 98.3 F (36.8 C) (Oral)   Resp 20   LMP 04/15/2021 Comment: Patient states she has a nuva ring  SpO2 100%   Physical Exam Vitals and nursing note reviewed.  Constitutional:      Appearance: She is well-developed.  HENT:     Head: Normocephalic.  Cardiovascular:     Rate and Rhythm: Normal rate and regular rhythm.     Heart sounds: No murmur heard. Pulmonary:     Effort: Pulmonary effort is normal.     Breath sounds: Normal breath sounds. No wheezing, rhonchi or rales.  Abdominal:     General: Bowel sounds are normal.     Palpations: Abdomen is soft.     Tenderness: There is no abdominal tenderness. There is no guarding or rebound.     Comments: Colostomy in LLQ abdomen. Surround tenderness to soft abdomen, no redness or skin breakdown. Stool in opaque, non-transparent bag. Unable to evaluate bleeding.  Musculoskeletal:        General: Normal range of motion.     Cervical back: Normal range of motion and neck supple.  Skin:    General: Skin is warm and dry.  Neurological:     General: No focal deficit present.     Mental Status:  She is alert and oriented to person, place, and time.    ED Results / Procedures / Treatments   Labs (all labs ordered are listed, but only abnormal results are displayed) Labs Reviewed  RESP PANEL BY RT-PCR (FLU A&B, COVID) [id number]  CBC WITH DIFFERENTIAL/PLATELET  COMPREHENSIVE METABOLIC PANEL   Results for orders placed or performed during the hospital encounter of 05/04/21  Resp Panel by RT-PCR (Flu A&B, Covid) Nasopharyngeal Swab   Specimen: Nasopharyngeal Swab; Nasopharyngeal(NP) swabs in vial transport medium  Result Value Ref Range   SARS Coronavirus 2 by RT PCR NEGATIVE NEGATIVE   Influenza A by PCR NEGATIVE NEGATIVE   Influenza B by PCR NEGATIVE NEGATIVE  CBC with Differential  Result Value Ref Range   WBC 3.5 (L) 4.0 - 10.5 K/uL   RBC 4.07 3.87 - 5.11 MIL/uL   Hemoglobin 12.6 12.0 - 15.0 g/dL   HCT 37.7 36.0 - 46.0 %   MCV 92.6 80.0 - 100.0 fL   MCH 31.0 26.0 - 34.0 pg   MCHC 33.4 30.0 - 36.0 g/dL   RDW 14.1 11.5 - 15.5 %   Platelets 75 (L) 150 - 400 K/uL   nRBC 0.0 0.0 - 0.2 %   Neutrophils Relative % 69 %   Neutro Abs 2.4 1.7 - 7.7 K/uL   Lymphocytes Relative 21 %   Lymphs Abs 0.8 0.7 - 4.0 K/uL   Monocytes Relative 9 %   Monocytes Absolute 0.3 0.1 - 1.0 K/uL   Eosinophils Relative 1 %   Eosinophils Absolute 0.1 0.0 - 0.5 K/uL   Basophils Relative 0 %   Basophils Absolute 0.0 0.0 - 0.1 K/uL   Immature Granulocytes 0 %   Abs Immature Granulocytes 0.01 0.00 - 0.07 K/uL  Comprehensive metabolic panel  Result Value Ref Range   Sodium 137 135 - 145 mmol/L  Potassium 3.7 3.5 - 5.1 mmol/L   Chloride 106 98 - 111 mmol/L   CO2 24 22 - 32 mmol/L   Glucose, Bld 87 70 - 99 mg/dL   BUN 7 6 - 20 mg/dL   Creatinine, Ser 0.61 0.44 - 1.00 mg/dL   Calcium 8.5 (L) 8.9 - 10.3 mg/dL   Total Protein 6.1 (L) 6.5 - 8.1 g/dL   Albumin 3.5 3.5 - 5.0 g/dL   AST 19 15 - 41 U/L   ALT 17 0 - 44 U/L   Alkaline Phosphatase 42 38 - 126 U/L   Total Bilirubin 0.9 0.3 - 1.2 mg/dL    GFR, Estimated >60 >60 mL/min   Anion gap 7 5 - 15    EKG None  Radiology No results found. CT ABDOMEN PELVIS W CONTRAST  Result Date: 05/05/2021 CLINICAL DATA:  Lower GI bleed EXAM: CT ABDOMEN AND PELVIS WITH CONTRAST TECHNIQUE: Multidetector CT imaging of the abdomen and pelvis was performed using the standard protocol following bolus administration of intravenous contrast. CONTRAST:  56m OMNIPAQUE IOHEXOL 350 MG/ML SOLN COMPARISON:  02/19/2021 FINDINGS: Lower chest: No acute abnormality. Hepatobiliary: Postsurgical changes are noted in the posterior aspect of the right lobe of the liver consistent with prior partial hepatectomy. Gallbladder has been surgically as well. Pancreas: Unremarkable. No pancreatic ductal dilatation or surrounding inflammatory changes. Spleen: Normal in size without focal abnormality. Adrenals/Urinary Tract: Adrenal glands are within normal limits. Kidneys demonstrate a normal enhancement pattern bilaterally. No renal calculi or obstructive changes are seen. Normal excretion is noted on delayed images. The ureters are within normal limits. The bladder is well distended. Stomach/Bowel: Hartmann's pouch is noted. There is a left mid abdominal ostomy is similar to that seen on the prior exam. More proximal colon appears within normal limits. Small bowel and stomach are unremarkable. Vascular/Lymphatic: No significant vascular findings are present. No enlarged abdominal or pelvic lymph nodes. Reproductive: Uterus is within normal limits. IUD is noted in place. Multiple prominent venous channels are noted surrounding the uterus likely related to a degree of pelvic varices. Other: No abdominal wall hernia or abnormality. No abdominopelvic ascites. Musculoskeletal: No acute or significant osseous findings. IMPRESSION: Postsurgical changes as described stable in appearance from the prior exam. Changes suggestive of mild pelvic varices. Electronically Signed   By: MInez CatalinaM.D.    On: 05/05/2021 02:46   [place]/Ven Flow Abd Pelv Doppler  Result Date: 04/28/2021 CLINICAL DATA:  Pelvic pain, vaginal bleeding, intrauterine device. EXAM: TRANSABDOMINAL AND TRANSVAGINAL ULTRASOUND OF PELVIS DOPPLER ULTRASOUND OF OVARIES TECHNIQUE: Both transabdominal and transvaginal ultrasound examinations of the pelvis were performed. Transabdominal technique was performed for global imaging of the pelvis including uterus, ovaries, adnexal regions, and pelvic cul-de-sac. It was necessary to proceed with endovaginal exam following the transabdominal exam to visualize the endometrial cavity and left ovary. Color and duplex Doppler ultrasound was utilized to evaluate blood flow to the ovaries. COMPARISON:  04/25/2021 FINDINGS: Uterus Measurements: 9.2 x 4.9 x 5.3 cm = volume: 126 mL. The previously noted intramural fundal fibroid noted anteriorly on prior examination is not as well seen on this exam. No other intrauterine masses are identified. The cervix is unremarkable. Endometrium An intrauterine device is in expected position within the endometrial cavity. This obscures the endometrial stripe though this does not appear thickened measuring 6 mm segmentally within the fundus. Right ovary Measurements: 5.0 x 3.1 x 3.1 cm = volume: 25 mL. A 4.5 cm hemorrhagic cyst is seen within the right  ovary. Left ovary Measurements: 3.9 x 2.2 x 4.0 cm = volume: 19 mL. A 3.2 cm hemorrhagic cyst is seen within the left ovary. Pulsed Doppler evaluation of both ovaries demonstrates normal low-resistance arterial and venous waveforms. Other findings No abnormal free fluid. IMPRESSION: Intrauterine device in expected position within the endometrial cavity. Previously noted intramural fibroid is not as well seen on the current examination. Bilateral adnexal hemorrhagic cyst again identified. Further follow-up is not required. Electronically Signed   By: Fidela Salisbury MD   On: 04/28/2021 01:35   US PELVIC COMPLETE WITH  TRANSVAGINAL  Result Date: 04/28/2021 CLINICAL DATA:  Pelvic pain, vaginal bleeding, intrauterine device. EXAM: TRANSABDOMINAL AND TRANSVAGINAL ULTRASOUND OF PELVIS DOPPLER ULTRASOUND OF OVARIES TECHNIQUE: Both transabdominal and transvaginal ultrasound examinations of the pelvis were performed. Transabdominal technique was performed for global imaging of the pelvis including uterus, ovaries, adnexal regions, and pelvic cul-de-sac. It was necessary to proceed with endovaginal exam following the transabdominal exam to visualize the endometrial cavity and left ovary. Color and duplex Doppler ultrasound was utilized to evaluate blood flow to the ovaries. COMPARISON:  04/25/2021 FINDINGS: Uterus Measurements: 9.2 x 4.9 x 5.3 cm = volume: 126 mL. The previously noted intramural fundal fibroid noted anteriorly on prior examination is not as well seen on this exam. No other intrauterine masses are identified. The cervix is unremarkable. Endometrium An intrauterine device is in expected position within the endometrial cavity. This obscures the endometrial stripe though this does not appear thickened measuring 6 mm segmentally within the fundus. Right ovary Measurements: 5.0 x 3.1 x 3.1 cm = volume: 25 mL. A 4.5 cm hemorrhagic cyst is seen within the right ovary. Left ovary Measurements: 3.9 x 2.2 x 4.0 cm = volume: 19 mL. A 3.2 cm hemorrhagic cyst is seen within the left ovary. Pulsed Doppler evaluation of both ovaries demonstrates normal low-resistance arterial and venous waveforms. Other findings No abnormal free fluid. IMPRESSION: Intrauterine device in expected position within the endometrial cavity. Previously noted intramural fibroid is not as well seen on the current examination. Bilateral adnexal hemorrhagic cyst again identified. Further follow-up is not required. Electronically Signed   By: Fidela Salisbury MD   On: 04/28/2021 01:35   US PELVIC COMPLETE WITH TRANSVAGINAL  Result Date: 04/26/2021 CLINICAL DATA:   History of prior ultrasound with complex cystic area. Follow-up evaluation. EXAM: TRANSABDOMINAL AND TRANSVAGINAL ULTRASOUND OF PELVIS TECHNIQUE: Both transabdominal and transvaginal ultrasound examinations of the pelvis were performed. Transabdominal technique was performed for global imaging of the pelvis including uterus, ovaries, adnexal regions, and pelvic cul-de-sac. It was necessary to proceed with endovaginal exam following the transabdominal exam to visualize the adnexa. COMPARISON:  Prior ultrasound from Feb 13, 2019 tube. FINDINGS: Uterus Measurements: 8.5 x 5.9 x 5.8 cm = volume: 152 mL. Leiomyoma in the anterior uterus measures approximately 2.0 x 2.0 x 2.4 cm and is little changed compared to prior imaging. Endometrium Thickness: 0.5 mm, IUD in place. IUD appears appropriately position. Right ovary Measurements: 5 x 3.2 x 3.7 (volume = 30) cm = volume: 30 mL. Cystic area within the ovary and other small follicles, dominant area 4.3 x 3.2 x 3.3 cm with "fishnet" internal appearance. Left ovary Measurements: 4.6 x 2.8 x 5.0 (volume = 34) cm = volume: 34 mL. Dominant cystic area in the LEFT ovary also with "fishnet" internal appearance, no mural nodularity. This measures 3.7 x 3.5 x 2.6 cm. Other findings Small volume free fluid. IMPRESSION: IUD in place with uterine fibroid. Bilateral  hemorrhagic ovarian cysts. No follow up imaging recommended. Note: This recommendation does not apply to premenarchal patients or to those with increased risk (genetic, family history, elevated tumor markers or other high-risk factors) of ovarian cancer. Reference: Radiology 2019 Nov; 293(2):359-371. Electronically Signed   By: Zetta Bills M.D.   On: 04/26/2021 15:42    Procedures Procedures   Medications Ordered in ED Medications  metoCLOPramide (REGLAN) injection 10 mg (has no administration in time range)  diphenhydrAMINE (BENADRYL) injection 12.5 mg (has no administration in time range)    ED Course  I  have reviewed the triage vital signs and the nursing notes.  Pertinent labs & imaging results that were available during my care of the patient were reviewed by me and considered in my medical decision making (see chart for details).    MDM Rules/Calculators/A&P                           Patient to ED with concern for BRB into colostomy. H/O recent colon CA in remission, scheduled for colostomy take-down in September, concerned for recurrent disease. Also complains of headache. Had URI symptoms earlier in the week.   Patient well appearing. ABdomen tender but soft, normal BSs. Colostomy in place. No surrounding changes.   Labs unremarkable. CT ordered and is negative for acute finding. Patient is reassured. Pain is improved with medications provided. Encouraged GI follow up for further evaluation of bleeding with stools.   Final Clinical Impression(s) / ED Diagnoses Final diagnoses:  None   Hematochezia   Rx / DC Orders ED Discharge Orders     None        Charlann Lange, PA-C 05/05/21 0448    Orpah Greek, MD 05/05/21 0500

## 2021-05-06 ENCOUNTER — Encounter: Payer: Self-pay | Admitting: Obstetrics & Gynecology

## 2021-05-06 ENCOUNTER — Other Ambulatory Visit: Payer: Self-pay

## 2021-05-06 ENCOUNTER — Ambulatory Visit (INDEPENDENT_AMBULATORY_CARE_PROVIDER_SITE_OTHER): Payer: Medicaid Other | Admitting: Obstetrics & Gynecology

## 2021-05-06 VITALS — BP 106/72 | HR 93 | Wt 150.0 lb

## 2021-05-06 DIAGNOSIS — Z30432 Encounter for removal of intrauterine contraceptive device: Secondary | ICD-10-CM

## 2021-05-06 DIAGNOSIS — C189 Malignant neoplasm of colon, unspecified: Secondary | ICD-10-CM

## 2021-05-06 DIAGNOSIS — Z975 Presence of (intrauterine) contraceptive device: Secondary | ICD-10-CM

## 2021-05-06 DIAGNOSIS — C787 Secondary malignant neoplasm of liver and intrahepatic bile duct: Secondary | ICD-10-CM

## 2021-05-06 DIAGNOSIS — N938 Other specified abnormal uterine and vaginal bleeding: Secondary | ICD-10-CM | POA: Diagnosis not present

## 2021-05-06 MED ORDER — TRAMADOL HCL 50 MG PO TABS: 50.0000 mg | ORAL_TABLET | Freq: Four times a day (QID) | ORAL | 0 refills | Status: DC | PRN

## 2021-05-06 NOTE — Progress Notes (Signed)
Pt states that she is bleeding x 1 month and is having increase in pain.  Here today for u/s follow up.

## 2021-05-06 NOTE — Progress Notes (Signed)
    GYNECOLOGY OFFICE PROCEDURE NOTE  Stephanie [name] is a 40 y.o. [phone] here for Paragard IUD removal. She has daily DUB despite trying NuvaRing for the last 2 months. She is not sexually active and would like to try removal of the IUD and to stop NuvaRing. Last pap smear was on 02/05/21 and was normal. [place] result was reviewed, IUD in position IUD Removal  Patient identified, informed consent performed, consent signed.  Patient was in the dorsal lithotomy position, normal external genitalia was noted.  A speculum was placed in the patient's vagina, normal discharge was noted, no lesions. The cervix was visualized, no lesions, no abnormal discharge.  The strings of the IUD were grasped and pulled using ring forceps. The IUD was removed in its entirety. Patient tolerated the procedure well.    Patient will use abstinence for contraception.  Routine preventative health maintenance measures emphasized. She will f/u after she has takedown procedure for her colostomy next month Meds ordered this encounter  Medications   traMADol (ULTRAM) 50 MG tablet    Sig: Take 1 tablet (50 mg total) by mouth every 6 (six) hours as needed for severe pain.    Dispense:  10 tablet    Refill:  0  For menstrual cramps, no refills.  Woodroe Mode, MD [place], [facility] for [place], [place]

## 2021-05-07 LAB — OVA AND PARASITE EXAMINATION
CONCENTRATE RESULT:: NONE SEEN
MICRO NUMBER:: 12207261
SPECIMEN QUALITY:: ADEQUATE
TRICHROME RESULT:: NONE SEEN

## 2021-05-10 ENCOUNTER — Encounter: Payer: Self-pay | Admitting: Hematology

## 2021-05-13 ENCOUNTER — Ambulatory Visit (INDEPENDENT_AMBULATORY_CARE_PROVIDER_SITE_OTHER): Payer: Medicaid Other | Admitting: Clinical

## 2021-05-13 ENCOUNTER — Other Ambulatory Visit: Payer: Self-pay

## 2021-05-13 DIAGNOSIS — F4322 Adjustment disorder with anxiety: Secondary | ICD-10-CM

## 2021-05-13 NOTE — Progress Notes (Signed)
   THERAPIST PROGRESS NOTE  Session Time: 8am  Participation Level: Active  Behavioral Response: NAAlertAnxious  Type of Therapy: Individual Therapy  Treatment Goals addressed: Anxiety and Coping  Interventions: Other: psychoeducation Virtual Visit via Telephone Note  I connected with Stephanie [name] on 05/13/21 at  8:00 AM EDT by telephone and verified that I am speaking with the correct person using two identifiers.  Location: Patient: home Provider: office   I discussed the limitations, risks, security and privacy concerns of performing an evaluation and management service by telephone and the availability of in person appointments. I also discussed with the patient that there may be a patient responsible charge related to this service. The patient expressed understanding and agreed to proceed.   I discussed the assessment and treatment plan with the patient. The patient was provided an opportunity to ask questions and all were answered. The patient agreed with the plan and demonstrated an understanding of the instructions.   The patient was advised to call back or seek an in-person evaluation if the symptoms worsen or if the condition fails to improve as anticipated.  I provided 45 minutes of non-face-to-face time during this encounter.   Summary: Stephanie [name] is a 40 y.o. female who reports history of anxiety. Pt says she is scheduled to have surgery on 9/1 and has rejoined the workforce. Pt says she is working part time and is having to adjust to working again. Pt reports she tapered off of Klonopin and says she did so so she did not develop a dependency. Also, pt states she did not like the withdrawal sxs she experienced when she did not take the medication. Pt says her overall mood has been "good" but has had stressors that have been challenging to manage.  Suicidal/Homicidal: Pt denies SI/HI no plan or intent to harm self or others reported.  Therapist Response:  CSW reviewed and provided pt with information on stress management tips. CSW discussed with pt the importance of setting time aside for herself and probed for feedback on what she does during her personal time when things get hectic. Pt identifies exercising, meditation/prayer as a part of self care. CSW actively listened as pt discussed having a youtube channel and feeling supported by [employer]. CSW modeled for pt reframing view on stress so it becomes more of an acceptable emotion and reducing negative sxs associated with it.  Plan: Return again in 2 weeks.  Diagnosis: Axis I: Adjustment disorder with anxious mood    Axis II: No diagnosis    Yvette Rack, LCSW 05/13/2021

## 2021-05-21 NOTE — Progress Notes (Signed)
Surgical Instructions    Your procedure is scheduled on Thursday, September 1st, 2022.   Report to [facility] Main Entrance "A" at 07:30 A.M., then check in with the Admitting office.  Call this number if you have problems the morning of surgery:  [phone]   If you have any questions prior to your surgery date call [phone]: Open Monday-Friday 8am-4pm    Remember:  Do not eat after midnight the night before your surgery  You may drink clear liquids until 06:30 the morning of your surgery.   Clear liquids allowed are: Water, Non-Citrus Juices (without pulp), Carbonated Beverages, Clear Tea, Black Coffee with (NO MILK, CREAM OR POWDERED CREAMER), and Gatorade  Patient Instructions  The night before surgery:  No food after midnight. ONLY clear liquids after midnight  The day of surgery (if you do NOT have diabetes):  Drink ONE (     ) Pre-Surgery Clear Ensure by 06:30 the morning of surgery. Drink in one sitting. Do not sip.  This drink was given to you during your hospital  pre-op appointment visit.  Nothing else to drink after completing the  Pre-Surgery Clear Ensure.         If you have questions, please contact your surgeon's office.     Take these medicines the morning of surgery with A SIP OF WATER:  clonazePAM (KLONOPIN) - if needed traMADol Veatrice Bourbon) - if needed  As of today, STOP taking any Aspirin (unless otherwise instructed by your surgeon) Aleve, Naproxen, Ibuprofen, Motrin, Advil, Goody's, BC's, all herbal medications, fish oil, and all vitamins.          Do not wear jewelry or makeup Do not wear lotions, powders, perfumes, or deodorant. Do not shave 48 hours prior to surgery.   Do not bring valuables to the hospital. DO Not wear nail polish, gel polish, artificial nails, or any other type of covering on natural nails including finger and toenails. If patients have artificial nails, gel coating, etc. that need to be removed by a nail salon please have  this removed prior to surgery or surgery may need to be canceled/delayed if the surgeon/ anesthesia feels like the patient is unable to be adequately monitored.             [facility] is not responsible for any belongings or valuables.  Do NOT Smoke (Tobacco/Vaping) or drink Alcohol 24 hours prior to your procedure If you use a CPAP at night, you may bring all equipment for your overnight stay.   Contacts, glasses, dentures or bridgework may not be worn into surgery, please bring cases for these belongings   For patients admitted to the hospital, discharge time will be determined by your treatment team.   Patients discharged the day of surgery will not be allowed to drive home, and someone needs to stay with them for 24 hours.  ONLY 1 SUPPORT PERSON MAY BE PRESENT WHILE YOU ARE IN SURGERY. IF YOU ARE TO BE ADMITTED ONCE YOU ARE IN YOUR ROOM YOU WILL BE ALLOWED TWO (2) VISITORS.  Minor children may have two parents present. Special consideration for safety and communication needs will be reviewed on a case by case basis.  Special instructions:    Oral Hygiene is also important to reduce your risk of infection.  Remember - BRUSH YOUR TEETH THE MORNING OF SURGERY WITH YOUR REGULAR TOOTHPASTE   [place]- Preparing For Surgery  Before surgery, you can play an important role. Because skin is not sterile,  your skin needs to be as free of germs as possible. You can reduce the number of germs on your skin by washing with CHG (chlorahexidine gluconate) Soap before surgery.  CHG is an antiseptic cleaner which kills germs and bonds with the skin to continue killing germs even after washing.     Please do not use if you have an allergy to CHG or antibacterial soaps. If your skin becomes reddened/irritated stop using the CHG.  Do not shave (including legs and underarms) for at least 48 hours prior to first CHG shower. It is OK to shave your face.  Please follow these instructions carefully.      Shower the NIGHT BEFORE SURGERY and the MORNING OF SURGERY with CHG Soap.   If you chose to wash your hair, wash your hair first as usual with your normal shampoo. After you shampoo, rinse your hair and body thoroughly to remove the shampoo.  Then [employer] and genitals (private parts) with your normal soap and rinse thoroughly to remove soap.  After that Use CHG Soap as you would any other liquid soap. You can apply CHG directly to the skin and wash gently with a scrungie or a clean washcloth.   Apply the CHG Soap to your body ONLY FROM THE NECK DOWN.  Do not use on open wounds or open sores. Avoid contact with your eyes, ears, mouth and genitals (private parts). Wash Face and genitals (private parts)  with your normal soap.   Wash thoroughly, paying special attention to the area where your surgery will be performed.  Thoroughly rinse your body with warm water from the neck down.  DO NOT shower/wash with your normal soap after using and rinsing off the CHG Soap.  Pat yourself dry with a CLEAN TOWEL.  Wear CLEAN PAJAMAS to bed the night before surgery  Place CLEAN SHEETS on your bed the night before your surgery  DO NOT SLEEP WITH PETS.   Day of Surgery:  Take a shower with CHG soap. Wear Clean/Comfortable clothing the morning of surgery Do not apply any deodorants/lotions.   Remember to brush your teeth WITH YOUR REGULAR TOOTHPASTE.   Please read over the following fact sheets that you were given.

## 2021-05-22 ENCOUNTER — Other Ambulatory Visit: Payer: Self-pay

## 2021-05-22 ENCOUNTER — Telehealth: Payer: Self-pay

## 2021-05-22 ENCOUNTER — Encounter (HOSPITAL_COMMUNITY)
Admission: RE | Admit: 2021-05-22 | Discharge: 2021-05-22 | Disposition: A | Discharge status: Home / Self Care | Payer: Medicaid Other | Source: Ambulatory Visit | Attending: General Surgery | Admitting: General Surgery

## 2021-05-22 ENCOUNTER — Other Ambulatory Visit: Payer: Self-pay | Admitting: Nurse Practitioner

## 2021-05-22 ENCOUNTER — Encounter (HOSPITAL_COMMUNITY): Payer: Self-pay | Admitting: General Surgery

## 2021-05-22 DIAGNOSIS — F419 Anxiety disorder, unspecified: Secondary | ICD-10-CM | POA: Insufficient documentation

## 2021-05-22 DIAGNOSIS — Z87891 Personal history of nicotine dependence: Secondary | ICD-10-CM | POA: Insufficient documentation

## 2021-05-22 DIAGNOSIS — Z01818 Encounter for other preprocedural examination: Secondary | ICD-10-CM | POA: Insufficient documentation

## 2021-05-22 DIAGNOSIS — C189 Malignant neoplasm of colon, unspecified: Secondary | ICD-10-CM | POA: Insufficient documentation

## 2021-05-22 DIAGNOSIS — Z79899 Other long term (current) drug therapy: Secondary | ICD-10-CM | POA: Diagnosis not present

## 2021-05-22 DIAGNOSIS — F411 Generalized anxiety disorder: Secondary | ICD-10-CM

## 2021-05-22 DIAGNOSIS — C19 Malignant neoplasm of rectosigmoid junction: Secondary | ICD-10-CM

## 2021-05-22 LAB — CBC WITH DIFFERENTIAL/PLATELET
Abs Immature Granulocytes: 0.02 10*3/uL (ref 0.00–0.07)
Basophils Absolute: 0 10*3/uL (ref 0.0–0.1)
Basophils Relative: 1 %
Eosinophils Absolute: 0.1 10*3/uL (ref 0.0–0.5)
Eosinophils Relative: 2 %
HCT: 47.6 % — ABNORMAL HIGH (ref 36.0–46.0)
Hemoglobin: 15.4 g/dL — ABNORMAL HIGH (ref 12.0–15.0)
Immature Granulocytes: 1 %
Lymphocytes Relative: 23 %
Lymphs Abs: 1 10*3/uL (ref 0.7–4.0)
MCH: 30.3 pg (ref 26.0–34.0)
MCHC: 32.4 g/dL (ref 30.0–36.0)
MCV: 93.5 fL (ref 80.0–100.0)
Monocytes Absolute: 0.5 10*3/uL (ref 0.1–1.0)
Monocytes Relative: 12 %
Neutro Abs: 2.7 10*3/uL (ref 1.7–7.7)
Neutrophils Relative %: 61 %
Platelets: 108 10*3/uL — ABNORMAL LOW (ref 150–400)
RBC: 5.09 MIL/uL (ref 3.87–5.11)
RDW: 13.7 % (ref 11.5–15.5)
WBC: 4.3 10*3/uL (ref 4.0–10.5)
nRBC: 0 % (ref 0.0–0.2)

## 2021-05-22 LAB — URINALYSIS, ROUTINE W REFLEX MICROSCOPIC
Bilirubin Urine: NEGATIVE
Glucose, UA: NEGATIVE mg/dL
Hgb urine dipstick: NEGATIVE
Ketones, ur: NEGATIVE mg/dL
Leukocytes,Ua: NEGATIVE
Nitrite: NEGATIVE
Protein, ur: NEGATIVE mg/dL
Specific Gravity, Urine: 1.016 (ref 1.005–1.030)
pH: 6 (ref 5.0–8.0)

## 2021-05-22 LAB — COMPREHENSIVE METABOLIC PANEL
ALT: 34 U/L (ref 0–44)
AST: 29 U/L (ref 15–41)
Albumin: 3.9 g/dL (ref 3.5–5.0)
Alkaline Phosphatase: 67 U/L (ref 38–126)
Anion gap: 6 (ref 5–15)
BUN: 12 mg/dL (ref 6–20)
CO2: 26 mmol/L (ref 22–32)
Calcium: 9 mg/dL (ref 8.9–10.3)
Chloride: 106 mmol/L (ref 98–111)
Creatinine, Ser: 0.67 mg/dL (ref 0.44–1.00)
GFR, Estimated: >60 mL/min (ref >60)
Glucose, Bld: 83 mg/dL (ref 70–99)
Potassium: 4.2 mmol/L (ref 3.5–5.1)
Sodium: 138 mmol/L (ref 135–145)
Total Bilirubin: 0.9 mg/dL (ref 0.3–1.2)
Total Protein: 6.9 g/dL (ref 6.5–8.1)

## 2021-05-22 LAB — TYPE AND SCREEN
ABO/RH(D): O POS
Antibody Screen: NEGATIVE

## 2021-05-22 LAB — HEMOGLOBIN A1C
Hgb A1c MFr Bld: 4.8 % (ref 4.8–5.6)
Mean Plasma Glucose: 91.06 mg/dL

## 2021-05-22 NOTE — Telephone Encounter (Signed)
This nurse spoke with patient who wanted to verify that she did not need to come for her CT scan on Thus 8/25.  The appointment is still showing on her appointment desk in [place].  This nurse advised per Dr.Feng that she does not need to attend and the appointment has been canceled.  No further questions or concerns at this time.

## 2021-05-22 NOTE — Progress Notes (Addendum)
Surgical Instructions    Your procedure is scheduled on Thursday, September 1st, 2022.   Report to [facility] Main Entrance "A" at 07:30 A.M., then check in with the Admitting office.  Call this number if you have problems the morning of surgery:  [phone]   If you have any questions prior to your surgery date call [phone]: Open Monday-Friday 8am-4pm    Remember:  Do not eat after midnight the night before your surgery  You may drink clear liquids until 06:30 the morning of your surgery.   Clear liquids allowed are: Water, Non-Citrus Juices (without pulp), Carbonated Beverages, Clear Tea, Black Coffee with (NO MILK, CREAM OR POWDERED CREAMER), and Gatorade  Patient Instructions  The night before surgery:  No food after midnight. ONLY clear liquids after midnight  The day of surgery (if you do NOT have diabetes):  Drink TWO ( 2 ) Pre-Surgery Clear Ensure by 5:45pm the evening before surgery and ONE (1) Pre-Surgery Clear Ensure by 6:30  am the morning of surgery. Drink in one sitting. Do not sip.  This drink was given to you during your hospital  pre-op appointment visit.  Nothing else to drink after completing the  Pre-Surgery Clear Ensure.         If you have questions, please contact your surgeon's office.     Take these medicines the morning of surgery with A SIP OF WATER:  clonazePAM (KLONOPIN) - if needed traMADol Veatrice Bourbon) - if needed  As of today, STOP taking any Aspirin (unless otherwise instructed by your surgeon) Aleve, Naproxen, Ibuprofen, Motrin, Advil, Goody's, BC's, all herbal medications, fish oil, and all vitamins.          Do not wear jewelry or makeup Do not wear lotions, powders, perfumes, or deodorant. Do not shave 48 hours prior to surgery.   Do not bring valuables to the hospital. DO Not wear nail polish, gel polish, artificial nails, or any other type of covering on natural nails including finger and toenails. If patients have artificial  nails, gel coating, etc. that need to be removed by a nail salon please have this removed prior to surgery or surgery may need to be canceled/delayed if the surgeon/ anesthesia feels like the patient is unable to be adequately monitored.             [facility] is not responsible for any belongings or valuables.  Do NOT Smoke (Tobacco/Vaping) or drink Alcohol 24 hours prior to your procedure If you use a CPAP at night, you may bring all equipment for your overnight stay.   Contacts, glasses, dentures or bridgework may not be worn into surgery, please bring cases for these belongings   For patients admitted to the hospital, discharge time will be determined by your treatment team.   Patients discharged the day of surgery will not be allowed to drive home, and someone needs to stay with them for 24 hours.  ONLY 1 SUPPORT PERSON MAY BE PRESENT WHILE YOU ARE IN SURGERY. IF YOU ARE TO BE ADMITTED ONCE YOU ARE IN YOUR ROOM YOU WILL BE ALLOWED TWO (2) VISITORS.  Minor children may have two parents present. Special consideration for safety and communication needs will be reviewed on a case by case basis.  Special instructions:    Oral Hygiene is also important to reduce your risk of infection.  Remember - BRUSH YOUR TEETH THE MORNING OF SURGERY WITH YOUR REGULAR TOOTHPASTE   [place]- Preparing For Surgery  Before surgery,  you can play an important role. Because skin is not sterile, your skin needs to be as free of germs as possible. You can reduce the number of germs on your skin by washing with CHG (chlorahexidine gluconate) Soap before surgery.  CHG is an antiseptic cleaner which kills germs and bonds with the skin to continue killing germs even after washing.     Please do not use if you have an allergy to CHG or antibacterial soaps. If your skin becomes reddened/irritated stop using the CHG.  Do not shave (including legs and underarms) for at least 48 hours prior to first CHG shower. It is  OK to shave your face.  Please follow these instructions carefully.     Shower the NIGHT BEFORE SURGERY and the MORNING OF SURGERY with CHG Soap.   If you chose to wash your hair, wash your hair first as usual with your normal shampoo. After you shampoo, rinse your hair and body thoroughly to remove the shampoo.  Then [employer] and genitals (private parts) with your normal soap and rinse thoroughly to remove soap.  After that Use CHG Soap as you would any other liquid soap. You can apply CHG directly to the skin and wash gently with a scrungie or a clean washcloth.   Apply the CHG Soap to your body ONLY FROM THE NECK DOWN.  Do not use on open wounds or open sores. Avoid contact with your eyes, ears, mouth and genitals (private parts). Wash Face and genitals (private parts)  with your normal soap.   Wash thoroughly, paying special attention to the area where your surgery will be performed.  Thoroughly rinse your body with warm water from the neck down.  DO NOT shower/wash with your normal soap after using and rinsing off the CHG Soap.  Pat yourself dry with a CLEAN TOWEL.  Wear CLEAN PAJAMAS to bed the night before surgery  Place CLEAN SHEETS on your bed the night before your surgery  DO NOT SLEEP WITH PETS.   Day of Surgery:  Take a shower with CHG soap. Wear Clean/Comfortable clothing the morning of surgery Do not apply any deodorants/lotions.   Remember to brush your teeth WITH YOUR REGULAR TOOTHPASTE.   Please read over the following fact sheets that you were given.

## 2021-05-22 NOTE — Progress Notes (Addendum)
PCP - Sees her oncologist Dr. Alto Denver with [facility] and Dr. Roselie Awkward her gynecologist   Chest x-ray - Not indicated EKG - 05/22/21 Stress Test - Denies ECHO - Denies Cardiac Cath - Denies  Sleep Study - Denies  DM - Denies  ERAS Protcol -Yes PRE-SURGERY Ensure given   COVID TEST- Monday August 29th asked to go.   Anesthesia review: Yes, abnormal EKG   Patient denies shortness of breath, fever, cough and chest pain at PAT appointment   All instructions explained to the patient, with a verbal understanding of the material. Patient agrees to go over the instructions while at home for a better understanding. Patient also instructed to wear a mask in public after being tested for COVID-19. The opportunity to ask questions was provided.

## 2021-05-23 ENCOUNTER — Ambulatory Visit (HOSPITAL_COMMUNITY): Payer: Medicaid Other

## 2021-05-23 ENCOUNTER — Inpatient Hospital Stay: Payer: Medicaid Other

## 2021-05-23 NOTE — Anesthesia Preprocedure Evaluation (Addendum)
Anesthesia Evaluation  Patient identified by MRN, date of birth, ID band Patient awake    Reviewed: Allergy & Precautions, NPO status , Patient's Chart, lab work & pertinent test results  Airway Mallampati: II  TM Distance: >3 FB Neck ROM: Full    Dental no notable dental hx.    Pulmonary former smoker,    Pulmonary exam normal breath sounds clear to auscultation       Cardiovascular Exercise Tolerance: Good negative cardio ROS Normal cardiovascular exam Rhythm:Regular Rate:Normal     Neuro/Psych PSYCHIATRIC DISORDERS Anxiety  Neuromuscular disease (neuropathy)    GI/Hepatic Neg liver ROS, Colon cancer   Endo/Other    Renal/GU   negative genitourinary   Musculoskeletal   Abdominal   Peds  Hematology   Anesthesia Other Findings Colostomy present  Reproductive/Obstetrics                            Anesthesia Physical Anesthesia Plan  ASA: 3  Anesthesia Plan: General   Post-op Pain Management:    Induction:   PONV Risk Score and Plan: 3 and Treatment may vary due to age or medical condition, Ondansetron, Dexamethasone and Midazolam  Airway Management Planned: Oral ETT  Additional Equipment: None  Intra-op Plan:   Post-operative Plan: Extubation in OR  Informed Consent: I have reviewed the patients History and Physical, chart, labs and discussed the procedure including the risks, benefits and alternatives for the proposed anesthesia with the patient or authorized representative who has indicated his/her understanding and acceptance.       Plan Discussed with: Anesthesiologist and CRNA  Anesthesia Plan Comments: (GETA. 1 PIV.  )       Anesthesia Quick Evaluation

## 2021-05-23 NOTE — Progress Notes (Addendum)
Anesthesia Chart Review:  Case: [id number] Date/Time: 05/30/21 0915   Procedure: LAPAROSCOPIC ASSISTED COLOSTOMY TAKEDOWN   Anesthesia type: General   Pre-op diagnosis: COLON CANCER   Location: [facility] OR ROOM 09 / [facility] OR   Surgeons: [name], MD       DISCUSSION: Patient is a 40 year old female scheduled for the above procedure.  History includes former smoker, colon cancer (open sigmoid colectomy & end colostomy with pelvic abscess, s/p bilateral temporary ureteral stent placement 04/04/20; right IJ Port-a-cath 04/12/20; s/p chemotherapy 05/23/20-10/24/20; s/p diagnostic laparoscopy, partial right hepatectomy for metastatic lesion 12/13/20), anxiety, chemo-induced peripheral neuropathy. Oncologist notes anemia and thrombocytopenia related to chemotherapy, received IV Feraheme x2 2021; however, has had a PLT count ~ 75-121K since 02/20/21.  Preoperative labs noted. H/H 15.4/47.6. PLT count 108K, up from 75K on 05/05/21 (range 75-121K since 02/20/21). She had a PLT count as low as 60K on 12/15/20 but had normal platelet counts in April 2022 before dropping again in May.   Preoperative COVID-19 testing is scheduled for 05/27/2021. Anesthesia team to evaluate on the day of surgery. She currently also has a follow-up visit with Dr. Burr Medico on 05/27/21. Will plan to follow-up notes and/or any additional labs and update my note if any pertinent new findings. (UPDATE 05/27/21 8:55 PM: 05/27/21 labs from [facility] appear stable. H/H 13.1/39.7. PLT count 97K.  Has T&S from PAT lab draw. ONC follow-up is planned after surgery.)    VS: BP 107/69   Pulse 72   Temp 36.8 C   Resp 18   Ht '5\' 9"'$  (1.753 m)   Wt 67 kg   LMP 04/29/2021 (Approximate)   SpO2 98%   BMI 21.83 kg/m    PROVIDERS: [name], MD is HEM-ONC [name], MD is GYN Weogufka Cellar, MD is GI   LABS: Preoperative labs noted. See DISCUSSION. (all labs ordered are listed, but only abnormal results are displayed)  Labs Reviewed  CBC WITH  DIFFERENTIAL/PLATELET - Abnormal; Notable for the following components:      Result Value   Hemoglobin 15.4 (*)    HCT 47.6 (*)    Platelets 108 (*)    All other components within normal limits  HEMOGLOBIN A1C  COMPREHENSIVE METABOLIC PANEL  URINALYSIS, ROUTINE W REFLEX MICROSCOPIC  TYPE AND SCREEN     IMAGES: CT Abd/pelvis 05/05/21: IMPRESSION: - Postsurgical changes as described stable in appearance from the Prior exam. - Changes suggestive of mild pelvic varices.   CXR 12/30/20: IMPRESSION: Improving atelectasis in the right base. Small amount of free air under the right hemidiaphragm likely representing residual postoperative collection.  CT Chest 11/12/20: IMPRESSION: 1. No evidence of metastatic disease in the chest. 2. 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   EKG: 05/22/21: Sinus rhythm with Premature atrial complexes in a pattern of bigeminy Low voltage QRS Cannot rule out Anterior infarct , age undetermined Abnormal ECG Confirmed by Thompson Grayer ([id number]) on 05/22/2021 10:13:19 PM   CV: N/A  Past Medical History:  Diagnosis Date   Anxiety    Blood transfusion without reported diagnosis    BV (bacterial vaginosis)    Cancer ([place])    Colon Cancer 03-2020   Colostomy present [facility])    03-2020   Family history of bladder cancer    History of chemotherapy    ended 09-2020   Lactose intolerance 03/12/2020   Neuromuscular disorder (HCC)    neuropathy feet hands legs   UTI (lower urinary tract  infection)     Past Surgical History:  Procedure Laterality Date   CESAREAN SECTION     CHOLECYSTECTOMY  12/13/2020   COLECTOMY  03/2020   COLONOSCOPY     CYSTOSCOPY WITH STENT PLACEMENT  04/04/2020   Procedure: CYSTOSCOPY WITH STENT PLACEMENT;  Surgeon: [name], MD;  Location: [facility];  Service: General;;   LAPAROSCOPIC LIVER ULTRASOUND N/A 12/13/2020   Procedure: INTRAOPERATIVE LIVER ULTRASOUND;  Surgeon: [name], MD;  Location: [facility];  Service: General;  Laterality: N/A;   LAPAROSCOPY N/A 12/13/2020   Procedure: LAPAROSCOPY DIAGNOSTIC;  Surgeon: [name], MD;  Location: [facility];  Service: General;  Laterality: N/A;   LAPAROTOMY N/A 04/04/2020   Procedure: EXPLORATORY LAPAROTOMY;  Surgeon: [name], MD;  Location: [facility];  Service: General;  Laterality: N/A;   OPEN PARTIAL HEPATECTOMY  N/A 12/13/2020   Procedure: OPEN PARTIAL HEPATECTOMY;  Surgeon: [name], MD;  Location: [place];  Service: General;  Laterality: N/A;  ROOM 2 STARTING AT 09:30AM FOR 300 MIN   PORTACATH PLACEMENT Right 04/12/2020   Procedure: INSERTION PORT-A-CATH WITH ULTRASOUND;  Surgeon: Mickeal Skinner, MD;  Location: [facility];  Service: General;  Laterality: Right;    MEDICATIONS:  Ascorbic Acid (VITAMIN C PO)   Cholecalciferol (VITAMIN D3 PO)   clindamycin (CLEOCIN) 2 % vaginal cream   clonazePAM (KLONOPIN) 0.5 MG tablet   etonogestrel-ethinyl estradiol (NUVARING) 0.12-0.015 MG/24HR vaginal ring   Investigational palmitoylethanolamide/placebo 400 MG capsule [facility]-2102   meloxicam (MOBIC) 7.5 MG tablet   metroNIDAZOLE (FLAGYL) 500 MG tablet   Prenatal Vit-Fe Fumarate-FA (PRENATAL MULTIVITAMIN) TABS tablet   sulfamethoxazole-trimethoprim (BACTRIM DS) 800-160 MG tablet   traMADol (ULTRAM) 50 MG tablet   zinc gluconate 50 MG tablet   No current facility-administered medications for this encounter.    Myra Gianotti, PA-C Surgical Short Stay/Anesthesiology [facility] Phone [phone] [facility] Phone [phone] 05/23/2021 5:43 PM

## 2021-05-24 ENCOUNTER — Other Ambulatory Visit: Payer: Self-pay | Admitting: Nurse Practitioner

## 2021-05-24 DIAGNOSIS — F411 Generalized anxiety disorder: Secondary | ICD-10-CM

## 2021-05-24 DIAGNOSIS — C19 Malignant neoplasm of rectosigmoid junction: Secondary | ICD-10-CM

## 2021-05-27 ENCOUNTER — Ambulatory Visit (HOSPITAL_COMMUNITY): Payer: Medicaid Other

## 2021-05-27 ENCOUNTER — Inpatient Hospital Stay: Payer: Medicaid Other | Attending: Nurse Practitioner | Admitting: Hematology

## 2021-05-27 ENCOUNTER — Other Ambulatory Visit: Payer: Self-pay

## 2021-05-27 ENCOUNTER — Other Ambulatory Visit: Payer: Medicaid Other

## 2021-05-27 ENCOUNTER — Inpatient Hospital Stay: Payer: Medicaid Other

## 2021-05-27 VITALS — BP 101/61 | HR 60 | Temp 98.4°F | Resp 16 | Ht 69.0 in | Wt 154.4 lb

## 2021-05-27 DIAGNOSIS — D509 Iron deficiency anemia, unspecified: Secondary | ICD-10-CM | POA: Diagnosis not present

## 2021-05-27 DIAGNOSIS — Z793 Long term (current) use of hormonal contraceptives: Secondary | ICD-10-CM | POA: Insufficient documentation

## 2021-05-27 DIAGNOSIS — F411 Generalized anxiety disorder: Secondary | ICD-10-CM

## 2021-05-27 DIAGNOSIS — Z79899 Other long term (current) drug therapy: Secondary | ICD-10-CM | POA: Diagnosis not present

## 2021-05-27 DIAGNOSIS — Z85048 Personal history of other malignant neoplasm of rectum, rectosigmoid junction, and anus: Secondary | ICD-10-CM | POA: Diagnosis not present

## 2021-05-27 DIAGNOSIS — D696 Thrombocytopenia, unspecified: Secondary | ICD-10-CM | POA: Insufficient documentation

## 2021-05-27 DIAGNOSIS — Z933 Colostomy status: Secondary | ICD-10-CM | POA: Insufficient documentation

## 2021-05-27 DIAGNOSIS — C19 Malignant neoplasm of rectosigmoid junction: Secondary | ICD-10-CM | POA: Diagnosis not present

## 2021-05-27 DIAGNOSIS — Z95828 Presence of other vascular implants and grafts: Secondary | ICD-10-CM

## 2021-05-27 LAB — CBC WITH DIFFERENTIAL (CANCER CENTER ONLY)
Abs Immature Granulocytes: 0.01 10*3/uL (ref 0.00–0.07)
Basophils Absolute: 0 10*3/uL (ref 0.0–0.1)
Basophils Relative: 0 %
Eosinophils Absolute: 0.1 10*3/uL (ref 0.0–0.5)
Eosinophils Relative: 3 %
HCT: 39.7 % (ref 36.0–46.0)
Hemoglobin: 13.1 g/dL (ref 12.0–15.0)
Immature Granulocytes: 0 %
Lymphocytes Relative: 20 %
Lymphs Abs: 0.8 10*3/uL (ref 0.7–4.0)
MCH: 30.5 pg (ref 26.0–34.0)
MCHC: 33 g/dL (ref 30.0–36.0)
MCV: 92.3 fL (ref 80.0–100.0)
Monocytes Absolute: 0.4 10*3/uL (ref 0.1–1.0)
Monocytes Relative: 11 %
Neutro Abs: 2.6 10*3/uL (ref 1.7–7.7)
Neutrophils Relative %: 66 %
Platelet Count: 97 10*3/uL — ABNORMAL LOW (ref 150–400)
RBC: 4.3 MIL/uL (ref 3.87–5.11)
RDW: 14 % (ref 11.5–15.5)
WBC Count: 4 10*3/uL (ref 4.0–10.5)
nRBC: 0 % (ref 0.0–0.2)

## 2021-05-27 LAB — CMP (CANCER CENTER ONLY)
ALT: 28 U/L (ref 0–44)
AST: 21 U/L (ref 15–41)
Albumin: 3.5 g/dL (ref 3.5–5.0)
Alkaline Phosphatase: 70 U/L (ref 38–126)
Anion gap: 8 (ref 5–15)
BUN: 9 mg/dL (ref 6–20)
CO2: 26 mmol/L (ref 22–32)
Calcium: 8.7 mg/dL — ABNORMAL LOW (ref 8.9–10.3)
Chloride: 106 mmol/L (ref 98–111)
Creatinine: 0.68 mg/dL (ref 0.44–1.00)
GFR, Estimated: >60 mL/min (ref >60)
Glucose, Bld: 93 mg/dL (ref 70–99)
Potassium: 4.3 mmol/L (ref 3.5–5.1)
Sodium: 140 mmol/L (ref 135–145)
Total Bilirubin: 0.5 mg/dL (ref 0.3–1.2)
Total Protein: 6.2 g/dL — ABNORMAL LOW (ref 6.5–8.1)

## 2021-05-27 LAB — CEA (IN HOUSE-CHCC): CEA (CHCC-In House): 1.91 ng/mL (ref 0.00–5.00)

## 2021-05-27 MED ORDER — SODIUM CHLORIDE 0.9% FLUSH: 10.0000 mL | Freq: Once | INTRAVENOUS | Status: DC

## 2021-05-27 MED ORDER — CLONAZEPAM 0.5 MG PO TABS: 0.5000 mg | ORAL_TABLET | Freq: Every day | ORAL | 0 refills | Status: DC | PRN

## 2021-05-27 NOTE — Progress Notes (Signed)
[place]   [phone] Fax:([phone]) [phone]   Clinic Follow up Note   Patient Care Team: Pcp, No as PCP - General [name], MD as Consulting Physician (Oncology) [name], MD as Consulting Physician (General Surgery)  Date of Service:  05/27/2021  CHIEF COMPLAINT: f/u of metastatic colon cancer  CURRENT THERAPY:  Surveillance, pending colostomy takedown 05/30/21  ASSESSMENT & PLAN:  Stephanie [name] is a 40 y.o. female with   1. 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 She was found to have perforation and positive radial margin. Liver biopsy on 04/16/2020 confirmed metastatic disease from her colon cancer -She received first line chemo with FOLFOXIRI q2 weeks for 6 months from 05/23/20 to 10/24/20. Bevacizumab-bvzr [name] added with C2.  -10/2020 CT Chest and MRI abdomen shows decreased oligo liver metastasis to 1.3cm, no evidence of other metastasis. -She underwent liver resection on 12/13/20 under Dr. Barry Dienes. Pathology showed: metastatic colon carcinoma to liver showing approximately 80% necrosis, resection margin negative.  -most recent CT AP from 05/05/21 showed stable postsurgical changes and no evidence of cancer recurrence. -She is scheduled for colostomy takedown on 05/30/21 with Dr. Barry Dienes. -plan for restaging CT CAP in 3 months, due to his high risk of recurrence.   2.  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 She notes she does drop small things. She rates it has 5/10. -She was enrolled in our clinical study on 03/07/21 but opted to withdraw on 03/29/21.   3. Anemia, secondary to #1 and iron deficiency from GI Blood loss. Resolved s/p IV Feraheme in 02/2020 and 05/11/20.  -anemia  resolved now    4. Mild thrombocytopenia -Secondary to chemotherapy -improving    5. Genetics  -Due to her young age, I recommended genetic testing to ruled out cancer syndrome.  She initially wanted to wait due to her insurance issue -Genetics consult on 12/04/20, genetic test was drawn 01/17/21 -Results were negative     Plan: -labs, flush, and CT CAP in 2 months, with f/u 1-2 days later -I refilled clonazepam for her today   No problem-specific Assessment & Plan notes found for this encounter.   SUMMARY OF ONCOLOGIC HISTORY: Oncology History Overview Note  Cancer Staging Malignant neoplasm of rectosigmoid junction [facility]) Staging form: Colon and Rectum, AJCC 8th Edition - Pathologic stage from 04/04/2020: pT4a, pN1b, cM1 - Signed by [name], NP on 05/07/2020    Malignant neoplasm of rectosigmoid junction ([place])  11/10/2019 Imaging   MRI Abdomen  IMPRESSION: 1. Redemonstrated hypoenhancing lesion of the posterior liver dome, hepatic segment VII, reduced in size compared to prior examination, measuring 1.3 x 1.3 cm, previously 1.8 x 1.7 cm. Findings are consistent with treatment response of a biopsy proven metastasis. No other evidence of lymphadenopathy or metastatic disease within the abdomen or pelvis. 2. Unchanged mild splenomegaly, maximum coronal span 14.0 cm. 3. 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   03/12/2020 Initial Diagnosis   Malignant neoplasm of rectosigmoid junction ([place])   03/12/2020 Imaging   CT AP with contrast IMPRESSION: 1. Overall findings are highly concerning for colorectal carcinoma involving the sigmoid colon with an associated perforation and adjacent abscess and phlegmon formation as detailed above. Currently, no collection is amenable to percutaneous  drainage given their small size and location. 2. New 2 cm mass in the right hepatic lobe concerning for metastatic disease to the liver until proven  otherwise. 3. Enlarged regional lymph nodes as detailed above is concerning for nodal metastatic disease. 4. Large stool burden. 5. Prominent pelvic veins which can be seen in patients with pelvic congestion syndrome.   03/13/2020 Imaging   ABD US IMPRESSION: Approximately 2.1 x 2.4 x 2.0 cm lobular homogeneously echogenic lesion in the right hepatic dome corresponds with the abnormality seen on the prior CT scan. Sonographically, this appearance is highly suggestive of a benign hemangioma.   Recommend MRI of the abdomen with gadolinium contrast which may provide a noninvasive diagnosis of benign hemangioma.    03/13/2020 Imaging   MR ABD W/WO CONTRAST Hepatobiliary: Diffuse low signal intensity throughout the hepatic parenchyma on T2 weighted images, presumably a consequence of recent Feraheme injection. In segment 7 of the liver (axial image 8 of series 5) there is a 2.5 x 1.9 cm well-defined lesion which is slightly T2 hyperintense. This lesion appears hyperintense on pre gadolinium T1 weighted images (likely a consequence of Feraheme). Interpretation of enhancement within the lesion is compromised by presence of Feraheme. No other hepatic lesions are confidently identified on today's examination. No intra or extrahepatic biliary ductal dilatation. Gallbladder is normal in appearance.   03/31/2020 Imaging   CT AP W contrast IMPRESSION: 1. Previously noted sigmoid colon mass appears increased in size, and again appears to be associated with a focal contained perforation which crosses the midline and has fistulized into the left adnexal region where there is now what appears to be a large left tubo-ovarian abscess, as detailed above. This is also associated with multiple enlarged lymph nodes in the pelvis measuring up to 1.2 cm in short axis and borderline enlarged retroperitoneal lymph nodes, concerning for metastatic disease. In addition, previously suspected metastatic lesion  in segment 7 of the liver has enlarged. 2. Small volume of ascites. 3. Additional incidental findings, as above.   04/04/2020 Cancer Staging   Staging form: Colon and Rectum, AJCC 8th Edition - Pathologic stage from 04/04/2020: pT4a, pN1b, cM1 - Signed by [name], NP on 05/07/2020   04/04/2020 Procedure   Paracentesis, path showed no malignant cells (mixed acute and chronic inflammation present)   04/04/2020 Surgery   Open sigmoid colectomy and end colostomy by Dr. [name]   04/04/2020 Pathology Results   FINAL MICROSCOPIC DIAGNOSIS: A. COLON, RECTOSIGMOID, RESECTION: - Invasive moderately differentiated adenocarcinoma, 6 cm, involving rectosigmoid junction - Carcinoma invades into serosal surface with perforation and associated serositis - Radial resection margin is positive for carcinoma; proximal and distal margins are not involved - Lymphovascular invasion is present - Metastatic carcinoma to one of fifteen lymph nodes (1/15); one tumor deposit - See oncology table B. LYMPH NODES, MESENTERIC, RESECTION: - Metastatic adenocarcinoma to one of six lymph nodes (1/6) - One tumor deposit  Addendum to note 2 involved lymph nodes (of 21 examined nodes) pT4a,pN1b MMR-normal, preserved expression of MLH1, MSH2, MSH6, PMS2   04/12/2020 Procedure   PAC placement    04/13/2020 Imaging   CT chest without contrast IMPRESSION: Interval development of bilateral pleural effusions, left slightly greater than right, with resultant bibasilar atelectasis including subtotal collapse of the left lower lobe. No evidence of intrathoracic metastatic disease, though evaluation of the collapsed parenchyma is limited. Hepatic metastasis again demonstrated.     04/16/2020 Pathology Results   FINAL MICROSCOPIC DIAGNOSIS:  A. LIVER,  RIGHT LOBE, BIOPSY:  - Adenocarcinoma.  COMMENT:  The morphology is compatible with the provided clinical history of colorectal carcinoma.    05/23/2020 - 10/24/2020  Chemotherapy   FOLFIRINOX q2weeks for 3-6 months starting 05/23/20. Bevacizumab-bvzr [name] added with C2. Oxaliplatin held with C11-12 due to reaction. (pt developed SOB, chest palpitation and abdominal discomfort shortly after oxaliplatin started). Completed on 10/24/20.   08/05/2020 Imaging   CT AP  IMPRESSION: 1. Postsurgical changes of distal colectomy with a left lower quadrant end ostomy. No evidence of obstruction or acute complication at this time. Excluded rectal pouch in the deep pelvis without acute complication or worrisome features. 2. Slight interval decrease in size of a hypoattenuating lesion posterior right lobe liver measuring 1.7 x 1.8 x 2 cm. This lesion has previously undergone ultrasound-guided biopsy with pathologic results demonstrating adenocarcinoma compatible with metastatic disease from patient's resected colorectal carcinoma. 3. Slight prominence of the parametrial vessels bilaterally, nonspecific though can be seen in the setting of pelvic congestion syndrome. 4. Mild splenomegaly.  No focal lesion.     11/12/2020 Imaging   CT Chest  IMPRESSION: 1. No evidence of metastatic disease in the chest. 2. 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   12/13/2020 Surgery   A. 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 The report date is 01/28/2021.   The CustomNext-Cancer+RNAinsight panel offered by [facility] included sequencing and rearrangement analysis for the following 47 genes:  APC, ATM, AXIN2, BARD1, BMPR1A, BRCA1,  BRCA2, BRIP1, CDH1, CDK4, CDKN2A, CHEK2, DICER1, EPCAM, GREM1, HOXB13, MEN1, MLH1, MSH2, MSH3, MSH6, MUTYH, NBN, NF1, NF2, NTHL1, PALB2, PMS2, POLD1, POLE, PTEN, RAD51C, RAD51D, RECQL, RET, SDHA, SDHAF2, SDHB, SDHC, SDHD, SMAD4, SMARCA4, STK11, TP53, TSC1, TSC2, and VHL.  RNA data is routinely analyzed for use in variant interpretation for all genes.   02/19/2021 Imaging   CT A/P w/o contrast  IMPRESSION: Slightly limited examination examination in absence of contrast administration. Status post partial right hepatectomy. Interval decrease in size in perihepatic fluid collection and resolution of right subdiaphragmatic fluid and gas. No new intra-abdominal fluid collections are identified.   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 Moderate stool throughout the colon without evidence of obstruction.   Fluid distension of the proximal duodenum to the level of the SMA hiatus which appears narrow. 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   05/05/2021 Imaging   CT AP  IMPRESSION: Postsurgical changes as described stable in appearance from the prior exam.   Changes suggestive of mild pelvic varices.     INTERVAL HISTORY:  Stephanie [name] is here for a follow up of metastatic colon cancer. She was last seen by me on 02/28/21. She presents to the clinic alone. She notes she is ready to be done with her colostomy.  She notes her bowel movements have been normal. She denies constipation. She notes she recently moved to an apartment. While it is supposed to be a non-smoking building, she reports her neighbors smoke. She notes she bought  an air purifier to try and combat the smoke. She notes she is back to work part time, and her daughter lives with her sometimes.   All other systems were reviewed with the patient and are negative.  MEDICAL  HISTORY:  Past Medical History:  Diagnosis Date   Anxiety    Blood transfusion without reported diagnosis    BV (bacterial vaginosis)    Cancer ([place])    Colon Cancer 03-2020   Colostomy present [facility])    03-2020   Family history of bladder cancer    History of chemotherapy    ended 09-2020   Lactose intolerance 03/12/2020   Neuromuscular disorder ([facility])    neuropathy feet hands legs   UTI (lower urinary tract infection)     SURGICAL HISTORY: Past Surgical History:  Procedure Laterality Date   CESAREAN SECTION     CHOLECYSTECTOMY  12/13/2020   COLECTOMY  03/2020   COLONOSCOPY     CYSTOSCOPY WITH STENT PLACEMENT  04/04/2020   Procedure: CYSTOSCOPY WITH STENT PLACEMENT;  Surgeon: [name], MD;  Location: [facility];  Service: General;;   LAPAROSCOPIC LIVER ULTRASOUND N/A 12/13/2020   Procedure: INTRAOPERATIVE LIVER ULTRASOUND;  Surgeon: [name], MD;  Location: [place];  Service: General;  Laterality: N/A;   LAPAROSCOPY N/A 12/13/2020   Procedure: LAPAROSCOPY DIAGNOSTIC;  Surgeon: [name], MD;  Location: [place];  Service: General;  Laterality: N/A;   LAPAROTOMY N/A 04/04/2020   Procedure: EXPLORATORY LAPAROTOMY;  Surgeon: [name], MD;  Location: [facility];  Service: General;  Laterality: N/A;   OPEN PARTIAL HEPATECTOMY  N/A 12/13/2020   Procedure: OPEN PARTIAL HEPATECTOMY;  Surgeon: [name], MD;  Location: [place];  Service: General;  Laterality: N/A;  ROOM 2 STARTING AT 09:30AM FOR 300 MIN   PORTACATH PLACEMENT Right 04/12/2020   Procedure: INSERTION PORT-A-CATH WITH ULTRASOUND;  Surgeon: Kieth Brightly Arta Bruce, MD;  Location: [facility];  Service: General;  Laterality: Right;    I have reviewed the social history and family history with the patient and they are unchanged from previous note.  ALLERGIES:  is allergic to oxaliplatin.  MEDICATIONS:  Current Outpatient Medications  Medication Sig Dispense Refill   Ascorbic Acid (VITAMIN C PO) Take 1 tablet by mouth  daily.     Cholecalciferol (VITAMIN D3 PO) Take 1 tablet by mouth daily.     clindamycin (CLEOCIN) 2 % vaginal cream Place 1 Applicatorful vaginally at bedtime. For 3 days (Patient not taking: No sig reported) 40 g 0   clonazePAM (KLONOPIN) 0.5 MG tablet Take 1 tablet (0.5 mg total) by mouth daily as needed for anxiety. 15 tablet 0   etonogestrel-ethinyl estradiol (NUVARING) 0.12-0.015 MG/24HR vaginal ring Insert vaginally and leave in place for 3 consecutive weeks, then remove for 1 week. (Patient not taking: No sig reported) 1 each 12   Investigational palmitoylethanolamide/placebo 400 MG capsule [id number] Take 1 capsule by mouth twice daily for 8 weeks. One capsule (400 mg) in the morning and one capsule (400 mg) in the evening, take with food and swallow whole. Do not crush or open capsule. Store at room temperature. Keep container tightly closed. 120 capsule 0   meloxicam (MOBIC) 7.5 MG tablet Take 1 tablet (7.5 mg total) by mouth daily. (Patient taking differently: Take 7.5 mg by mouth daily as needed for pain.) 20 tablet 0   metroNIDAZOLE (FLAGYL) 500 MG tablet Take 1 tablet (500 mg total) by mouth 2 (two) times daily. (Patient not taking: No sig reported) 14  tablet 0   Prenatal Vit-Fe Fumarate-FA (PRENATAL MULTIVITAMIN) TABS tablet Take 1 tablet by mouth daily at 12 noon.     sulfamethoxazole-trimethoprim (BACTRIM DS) 800-160 MG tablet Take 1 tablet by mouth 2 (two) times daily. (Patient not taking: No sig reported) 10 tablet 0   traMADol (ULTRAM) 50 MG tablet Take 1 tablet (50 mg total) by mouth every 6 (six) hours as needed for severe pain. 10 tablet 0   zinc gluconate 50 MG tablet Take 50 mg by mouth daily.     No current facility-administered medications for this visit.    PHYSICAL EXAMINATION: ECOG PERFORMANCE STATUS: 0 - Asymptomatic  Vitals:   05/27/21 0951  BP: 101/61  Pulse: 60  Resp: 16  Temp: 98.4 F (36.9 C)  SpO2: 99%   Wt Readings from Last 3 Encounters:   05/27/21 154 lb 6.4 oz (70 kg)  05/22/21 147 lb 12.8 oz (67 kg)  05/06/21 150 lb (68 kg)     GENERAL:alert, no distress and comfortable SKIN: skin color, texture, turgor are normal, no rashes or significant lesions EYES: normal, Conjunctiva are pink and non-injected, sclera clear  NECK: supple, thyroid normal size, non-tender, without nodularity LYMPH:  no palpable lymphadenopathy in the cervical, axillary  LUNGS: clear to auscultation and percussion with normal breathing effort HEART: regular rate & rhythm and no murmurs and no lower extremity edema ABDOMEN:abdomen soft, non-tender and normal bowel sounds Musculoskeletal:no cyanosis of digits and no clubbing  NEURO: alert & oriented x 3 with fluent speech, no focal motor/sensory deficits  LABORATORY DATA:  I have reviewed the data as listed CBC Latest Ref Rng & Units 05/27/2021 05/22/2021 05/05/2021  WBC 4.0 - 10.5 K/uL 4.0 4.3 3.5(L)  Hemoglobin 12.0 - 15.0 g/dL 13.1 15.4(H) 12.6  Hematocrit 36.0 - 46.0 % 39.7 47.6(H) 37.7  Platelets 150 - 400 K/uL 97(L) 108(L) 75(L)     CMP Latest Ref Rng & Units 05/27/2021 05/22/2021 05/05/2021  Glucose 70 - 99 mg/dL 93 83 87  BUN 6 - 20 mg/dL $Remove'9 12 7  'XuTNHTG$ Creatinine 0.44 - 1.00 mg/dL 0.68 0.67 0.61  Sodium 135 - 145 mmol/L 140 138 137  Potassium 3.5 - 5.1 mmol/L 4.3 4.2 3.7  Chloride 98 - 111 mmol/L 106 106 106  CO2 22 - 32 mmol/L $RemoveB'26 26 24  'dOxgyqNR$ Calcium 8.9 - 10.3 mg/dL 8.7(L) 9.0 8.5(L)  Total Protein 6.5 - 8.1 g/dL 6.2(L) 6.9 6.1(L)  Total Bilirubin 0.3 - 1.2 mg/dL 0.5 0.9 0.9  Alkaline Phos 38 - 126 U/L 70 67 42  AST 15 - 41 U/L $Remo'21 29 19  'hTfQH$ ALT 0 - 44 U/L 28 34 17      RADIOGRAPHIC STUDIES: I have personally reviewed the radiological images as listed and agreed with the findings in the report. No results found.    Orders Placed This Encounter  Procedures   CT CHEST ABDOMEN PELVIS W CONTRAST    Standing Status:   Future    Standing Expiration Date:   05/27/2022    Order Specific Question:    If indicated for the ordered procedure, I authorize the administration of contrast media per Radiology protocol    Answer:   Yes    Order Specific Question:   Is patient pregnant?    Answer:   No    Order Specific Question:   Preferred imaging location?    Answer:   [facility]    Order Specific Question:   Release to patient    Answer:  Immediate    Order Specific Question:   Is Oral Contrast requested for this exam?    Answer:   Yes, Per Radiology protocol    Order Specific Question:   Reason for Exam (SYMPTOM  OR DIAGNOSIS REQUIRED)    Answer:   rule out recurrence   CEA (IN HOUSE-[facility])FOR [place] [facility] ONLY    Standing Status:   Standing    Number of Occurrences:   20    Standing Expiration Date:   05/27/2022   CMP ([place] only)    Standing Status:   Standing    Number of Occurrences:   20    Standing Expiration Date:   05/27/2022   CBC with Differential ([place] Only)    Standing Status:   Standing    Number of Occurrences:   20    Standing Expiration Date:   05/27/2022   All questions were answered. The patient knows to call the clinic with any problems, questions or concerns. No barriers to learning was detected. The total time spent in the appointment was 30 minutes.     [name], MD 05/27/2021   I, Wilburn Mylar, am acting as scribe for [name], MD.   I have reviewed the above documentation for accuracy and completeness, and I agree with the above.

## 2021-05-28 ENCOUNTER — Telehealth: Payer: Self-pay | Admitting: Hematology

## 2021-05-28 ENCOUNTER — Encounter: Payer: Self-pay | Admitting: Hematology

## 2021-05-28 NOTE — Telephone Encounter (Signed)
Left message with follow-up appointments per 8/29 los. 

## 2021-05-29 LAB — SARS CORONAVIRUS 2 (TAT 6-24 HRS): SARS Coronavirus 2: NEGATIVE

## 2021-05-29 NOTE — H&P (Signed)
PROVIDER:  Georgianne Fick, MD   MRN: [id number] DOB: 04-19-1981 DATE OF ENCOUNTER: 05/20/2021 Subjective    Chief Complaint: pre-surgery appointment       History of Present Illness:   The patient is a 40 year old female who presents for a Follow-up for Colorectal cancer.   Pt is a 40 yo F who came to medical attention 03/12/2020. It was felt that she had contained perforation with phlegmon.  There was concern for malignancy with this admission.  She was discharged with hope that she would improved adequately for an outpatient colonoscopy, however, she got readmitted 03/31/2020.  It looked worse at that point and she was taken to the OR 04/04/2020.  She had open sigmoid colectomy with ostomy and wound left open.  She also had a liver mass that looked like it might be benign on imaging, but was since been biopsied and was positive for metastatic disease.     She subsequently completed 12 rounds of chemotherapy (Dr. Burr Medico)  She was able to get disability which has helped her financial issues.  She tolerated chemo well for the most part except for reaction to oxaliplatin during cycle 11. Restaging scans 11/09/20 showed continued decrease in the size of the mass.  CEA 2.81.  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     She underwent partial right hepatectomy 12/13/20.  She had some issues in the hospital including a transfusion (started very anemic from chemo) and leg numbness from the epidural.  She also had drain output that was bile stained so it was left in place.  She had a CT before d/c that showed small fluid collection, but drain was functioning and was in the collection.  She required readmission for failure to thrive and received IV fluids and improved pain control and went home 01/02/21.  She saw Dr. Burr Medico post op and didn't need post op chemo.  Drain was eventually pulled in clinic and she hasn't developed jaundice or fever/chills.     She is now off pain medication.  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  She is doing well overall.     Her pain at the midline incision has significantly improved.  She is now having issues with ostomy retraction.  She has leakage at lease 1-3 times per week and has to change her bag.  She switched to convex wafers and is still having issues, but they stay a bit longer.       Review of Systems: A complete review of systems was obtained from the patient.  I have reviewed this information and discussed as appropriate with the patient.  See HPI as well for other ROS.   Review of Systems  All other systems reviewed and are negative.       Medical History: Past Medical History      Past Medical History:  Diagnosis Date   History of cancer             Patient Active Problem List  Diagnosis   Colostomy dysfunction (CMS-HCC)   Rectosigmoid cancer (CMS-HCC)      Past Surgical History       Past Surgical History:  Procedure Laterality Date   OSTOMY AND LIVER RESECTION            Allergies      Allergies  Allergen Reactions   Oxaliplatin Shortness Of Breath  No current outpatient medications on file prior to visit.    No current facility-administered medications on file prior to visit.      Family History       Family History  Problem Relation Age of Onset   Obesity Mother     Stroke Father          Social History       Tobacco Use  Smoking Status Former Smoker  Smokeless Tobacco Former Systems developer      Social History  Social History        Socioeconomic History   Marital status: Unknown  Tobacco Use   Smoking status: Former Smoker   Smokeless tobacco: Former Network engineer and Sexual Activity   Alcohol use: Not Currently   Drug use: Never        Objective:         Vitals:    05/20/21 1208  BP: 130/72  Pulse: 100  Temp: 36.7 C (98 F)  SpO2: 99%  Weight: 67.8 kg (149 lb 6.4 oz)  Height: 175.3 cm ('5\' 9"'$ )    Body mass index is  22.06 kg/m.   Head:   Normocephalic and atraumatic.  Eyes:    Conjunctivae are normal. Pupils are equal, round, and reactive to light. No scleral icterus.  Neck:   Normal range of motion. Neck supple. No tracheal deviation present. No thyromegaly present.  Resp:   No respiratory distress, normal effort. Abd:      Abdomen is soft, non distended and non tender. No masses are palpable.  There is no rebound and no guarding.  Wounds healed.  Ostomy retracted a bit.  Neurological: Alert and oriented to person, place, and time. Coordination normal.  Skin:    Skin is warm and dry. No rash noted. No diaphoretic. No erythema. No pallor.  Psychiatric: Normal mood and affect. Normal behavior. Judgment and thought content normal.      Labs, Imaging and Diagnostic Testing:   CT abd/pelvis 05/05/21 IMPRESSION: Postsurgical changes as described stable in appearance from the prior exam.  Changes suggestive of mild pelvic varices.   Assessment and Plan:  Diagnoses and all orders for this visit:   Colostomy dysfunction (CMS-HCC) Assessment & Plan: Will plan ostomy takedown. Discussed risks with patient including leak and new ostomy.   Reviewed risk of bleeding, infection, possible conversion to open incision, damage to adjacent structures, heart or lung issues, finding recurrent cancer, aborting the case if the rectal stump is not favorable or can't be found, and more.  Discussed blood clot.   She wishes to proceed.  Even if we can't take the ostomy down, she would benefit from revision given the constant issues she is having with the ostomy.       Rectosigmoid cancer (CMS-HCC) Assessment & Plan: No clinical evidence of disease.       Other orders -     polyethylene glycol (MIRALAX) powder; Take 233.75 g by mouth once for 1 dose Take according to your procedure prep instructions. -     bisacodyL (DULCOLAX) 5 mg EC tablet; Take 4 tablets (20 mg total) by mouth once daily as needed for Constipation  for up to 1 dose -     metroNIDAZOLE (FLAGYL) 500 MG tablet; Take 2 tablets (1,000 mg total) by mouth 3 (three) times daily for 3 doses Take according to your procedure colon prep instructions -     neomycin 500 mg tablet; Take 2 tablets (1,000 mg total) by mouth  3 (three) times daily for 3 doses Take according to your procedure colon prep instructions           Georgianne Fick, MD

## 2021-05-30 ENCOUNTER — Encounter (HOSPITAL_COMMUNITY): Payer: Self-pay | Admitting: General Surgery

## 2021-05-30 ENCOUNTER — Inpatient Hospital Stay (HOSPITAL_COMMUNITY)
Admission: RE | Admit: 2021-05-30 | Discharge: 2021-06-03 | DRG: 330 | Disposition: A | Discharge status: Home / Self Care | Payer: Medicaid Other | Attending: General Surgery | Admitting: General Surgery

## 2021-05-30 ENCOUNTER — Inpatient Hospital Stay (HOSPITAL_COMMUNITY): Payer: Medicaid Other | Admitting: Vascular Surgery

## 2021-05-30 ENCOUNTER — Other Ambulatory Visit: Payer: Self-pay

## 2021-05-30 ENCOUNTER — Encounter (HOSPITAL_COMMUNITY)
Admission: RE | Disposition: A | Discharge status: Home / Self Care | Payer: Self-pay | Source: Home / Self Care | Attending: General Surgery

## 2021-05-30 ENCOUNTER — Ambulatory Visit: Payer: Medicaid Other | Admitting: Hematology

## 2021-05-30 DIAGNOSIS — T451X5A Adverse effect of antineoplastic and immunosuppressive drugs, initial encounter: Secondary | ICD-10-CM | POA: Diagnosis present

## 2021-05-30 DIAGNOSIS — C787 Secondary malignant neoplasm of liver and intrahepatic bile duct: Secondary | ICD-10-CM | POA: Diagnosis present

## 2021-05-30 DIAGNOSIS — I00 Rheumatic fever without heart involvement: Secondary | ICD-10-CM | POA: Diagnosis present

## 2021-05-30 DIAGNOSIS — D696 Thrombocytopenia, unspecified: Secondary | ICD-10-CM | POA: Diagnosis present

## 2021-05-30 DIAGNOSIS — Z823 Family history of stroke: Secondary | ICD-10-CM

## 2021-05-30 DIAGNOSIS — C19 Malignant neoplasm of rectosigmoid junction: Secondary | ICD-10-CM | POA: Diagnosis present

## 2021-05-30 DIAGNOSIS — Z87891 Personal history of nicotine dependence: Secondary | ICD-10-CM | POA: Diagnosis not present

## 2021-05-30 DIAGNOSIS — K9403 Colostomy malfunction: Principal | ICD-10-CM | POA: Diagnosis present

## 2021-05-30 DIAGNOSIS — G620 Drug-induced polyneuropathy: Secondary | ICD-10-CM | POA: Diagnosis present

## 2021-05-30 DIAGNOSIS — Z9049 Acquired absence of other specified parts of digestive tract: Secondary | ICD-10-CM | POA: Diagnosis not present

## 2021-05-30 HISTORY — PX: COLOSTOMY TAKEDOWN: SHX5783

## 2021-05-30 LAB — CBC
HCT: 39.3 % (ref 36.0–46.0)
Hemoglobin: 13.1 g/dL (ref 12.0–15.0)
MCH: 31.1 pg (ref 26.0–34.0)
MCHC: 33.3 g/dL (ref 30.0–36.0)
MCV: 93.3 fL (ref 80.0–100.0)
Platelets: 74 10*3/uL — ABNORMAL LOW (ref 150–400)
RBC: 4.21 MIL/uL (ref 3.87–5.11)
RDW: 13.9 % (ref 11.5–15.5)
WBC: 5.4 10*3/uL (ref 4.0–10.5)
nRBC: 0 % (ref 0.0–0.2)

## 2021-05-30 LAB — POCT PREGNANCY, URINE: Preg Test, Ur: NEGATIVE

## 2021-05-30 LAB — CREATININE, SERUM
Creatinine, Ser: 0.75 mg/dL (ref 0.44–1.00)
GFR, Estimated: >60 mL/min (ref >60)

## 2021-05-30 SURGERY — CLOSURE, COLOSTOMY
Anesthesia: General | Site: Abdomen

## 2021-05-30 MED ORDER — ENSURE SURGERY PO LIQD
237.0000 mL | Freq: Two times a day (BID) | ORAL | Status: DC
  Administered 2021-05-31: 237 mL via ORAL
  Filled 2021-05-30 (×8): qty 237

## 2021-05-30 MED ORDER — MIDAZOLAM HCL 2 MG/2ML IJ SOLN
INTRAMUSCULAR | Status: DC | PRN
  Administered 2021-05-30: 2 mg via INTRAVENOUS

## 2021-05-30 MED ORDER — SODIUM CHLORIDE 0.9 % IR SOLN
Status: DC | PRN
  Administered 2021-05-30: 1000 mL

## 2021-05-30 MED ORDER — ENSURE PRE-SURGERY PO LIQD: 296.0000 mL | Freq: Once | ORAL | Status: DC

## 2021-05-30 MED ORDER — MIDAZOLAM HCL 2 MG/2ML IJ SOLN
INTRAMUSCULAR | Status: AC
  Filled 2021-05-30: qty 2

## 2021-05-30 MED ORDER — CHLORHEXIDINE GLUCONATE 0.12 % MT SOLN
OROMUCOSAL | Status: AC
  Administered 2021-05-30: 15 mL via OROMUCOSAL
  Filled 2021-05-30: qty 15

## 2021-05-30 MED ORDER — ACETAMINOPHEN 325 MG PO TABS: 650.0000 mg | ORAL_TABLET | Freq: Four times a day (QID) | ORAL | Status: DC | PRN

## 2021-05-30 MED ORDER — CLONAZEPAM 0.5 MG PO TABS: 0.5000 mg | ORAL_TABLET | Freq: Every day | ORAL | Status: DC | PRN

## 2021-05-30 MED ORDER — LIDOCAINE 2% (20 MG/ML) 5 ML SYRINGE
INTRAMUSCULAR | Status: DC | PRN
  Administered 2021-05-30: 80 mg via INTRAVENOUS

## 2021-05-30 MED ORDER — OXYCODONE HCL 5 MG PO TABS
5.0000 mg | ORAL_TABLET | ORAL | Status: DC | PRN
  Administered 2021-05-30: 5 mg via ORAL
  Administered 2021-05-31 – 2021-06-03 (×10): 10 mg via ORAL
  Filled 2021-05-30 (×12): qty 2

## 2021-05-30 MED ORDER — LACTATED RINGERS IV SOLN: INTRAVENOUS | Status: DC

## 2021-05-30 MED ORDER — ENSURE PRE-SURGERY PO LIQD: 592.0000 mL | Freq: Once | ORAL | Status: DC

## 2021-05-30 MED ORDER — EPHEDRINE SULFATE-NACL 50-0.9 MG/10ML-% IV SOSY
PREFILLED_SYRINGE | INTRAVENOUS | Status: DC | PRN
  Administered 2021-05-30 (×2): 5 mg via INTRAVENOUS

## 2021-05-30 MED ORDER — EPHEDRINE 5 MG/ML INJ
INTRAVENOUS | Status: AC
  Filled 2021-05-30: qty 5

## 2021-05-30 MED ORDER — SODIUM CHLORIDE 0.9 % IV SOLN
INTRAVENOUS | Status: DC
  Filled 2021-05-30: qty 6

## 2021-05-30 MED ORDER — LIDOCAINE HCL 1 % IJ SOLN
INTRAMUSCULAR | Status: DC | PRN
  Administered 2021-05-30: 28 mL

## 2021-05-30 MED ORDER — ALUM & MAG HYDROXIDE-SIMETH 200-200-20 MG/5ML PO SUSP: 30.0000 mL | Freq: Four times a day (QID) | ORAL | Status: DC | PRN

## 2021-05-30 MED ORDER — OXYCODONE HCL 5 MG/5ML PO SOLN
5.0000 mg | Freq: Once | ORAL | Status: AC | PRN
Start: 2021-05-30 — End: 2021-05-30
  Administered 2021-05-30: 5 mg via ORAL

## 2021-05-30 MED ORDER — HYDROMORPHONE HCL 1 MG/ML IJ SOLN
0.5000 mg | INTRAMUSCULAR | Status: DC | PRN
  Administered 2021-05-30 – 2021-06-02 (×8): 1 mg via INTRAVENOUS
  Filled 2021-05-30 (×9): qty 1

## 2021-05-30 MED ORDER — ACETAMINOPHEN 500 MG PO TABS: 1000.0000 mg | ORAL_TABLET | ORAL | Status: AC

## 2021-05-30 MED ORDER — GABAPENTIN 100 MG PO CAPS
100.0000 mg | ORAL_CAPSULE | Freq: Two times a day (BID) | ORAL | Status: DC
  Administered 2021-05-30 – 2021-06-03 (×8): 100 mg via ORAL
  Filled 2021-05-30 (×8): qty 1

## 2021-05-30 MED ORDER — ALVIMOPAN 12 MG PO CAPS
12.0000 mg | ORAL_CAPSULE | Freq: Two times a day (BID) | ORAL | Status: DC
  Administered 2021-05-31 – 2021-06-02 (×6): 12 mg via ORAL
  Filled 2021-05-30 (×8): qty 1

## 2021-05-30 MED ORDER — PROPOFOL 10 MG/ML IV BOLUS
INTRAVENOUS | Status: DC | PRN
  Administered 2021-05-30: 200 mg via INTRAVENOUS

## 2021-05-30 MED ORDER — SUGAMMADEX SODIUM 200 MG/2ML IV SOLN
INTRAVENOUS | Status: DC | PRN
  Administered 2021-05-30: 200 mg via INTRAVENOUS

## 2021-05-30 MED ORDER — PROCHLORPERAZINE MALEATE 10 MG PO TABS
10.0000 mg | ORAL_TABLET | Freq: Four times a day (QID) | ORAL | Status: DC | PRN
  Filled 2021-05-30: qty 1

## 2021-05-30 MED ORDER — ALVIMOPAN 12 MG PO CAPS: 12.0000 mg | ORAL_CAPSULE | ORAL | Status: AC

## 2021-05-30 MED ORDER — BUPIVACAINE LIPOSOME 1.3 % IJ SUSP: 20.0000 mL | Freq: Once | INTRAMUSCULAR | Status: DC

## 2021-05-30 MED ORDER — LACTATED RINGERS IV SOLN: INTRAVENOUS | Status: DC | PRN

## 2021-05-30 MED ORDER — DEXAMETHASONE SODIUM PHOSPHATE 10 MG/ML IJ SOLN
INTRAMUSCULAR | Status: AC
  Filled 2021-05-30: qty 1

## 2021-05-30 MED ORDER — LIDOCAINE 2% (20 MG/ML) 5 ML SYRINGE
INTRAMUSCULAR | Status: AC
  Filled 2021-05-30: qty 5

## 2021-05-30 MED ORDER — CHLORHEXIDINE GLUCONATE 0.12 % MT SOLN: 15.0000 mL | Freq: Once | OROMUCOSAL | Status: AC

## 2021-05-30 MED ORDER — FENTANYL CITRATE (PF) 250 MCG/5ML IJ SOLN
INTRAMUSCULAR | Status: AC
  Filled 2021-05-30: qty 5

## 2021-05-30 MED ORDER — TRAMADOL HCL 50 MG PO TABS
50.0000 mg | ORAL_TABLET | Freq: Four times a day (QID) | ORAL | Status: DC | PRN
Start: 2021-05-30 — End: 2021-06-03
  Administered 2021-05-31 (×2): 50 mg via ORAL
  Filled 2021-05-30 (×2): qty 1

## 2021-05-30 MED ORDER — PROPOFOL 10 MG/ML IV BOLUS
INTRAVENOUS | Status: AC
  Filled 2021-05-30: qty 20

## 2021-05-30 MED ORDER — SODIUM CHLORIDE 0.9 % IV SOLN
2.0000 g | Freq: Two times a day (BID) | INTRAVENOUS | Status: AC
  Administered 2021-05-30: 2 g via INTRAVENOUS
  Filled 2021-05-30: qty 2

## 2021-05-30 MED ORDER — DEXAMETHASONE SODIUM PHOSPHATE 10 MG/ML IJ SOLN
INTRAMUSCULAR | Status: DC | PRN
  Administered 2021-05-30: 8 mg via INTRAVENOUS

## 2021-05-30 MED ORDER — ONDANSETRON HCL 4 MG/2ML IJ SOLN
INTRAMUSCULAR | Status: AC
  Filled 2021-05-30: qty 2

## 2021-05-30 MED ORDER — MELOXICAM 7.5 MG PO TABS
7.5000 mg | ORAL_TABLET | Freq: Every day | ORAL | Status: DC
  Administered 2021-05-30 – 2021-06-03 (×5): 7.5 mg via ORAL
  Filled 2021-05-30 (×5): qty 1

## 2021-05-30 MED ORDER — HYDROMORPHONE HCL 1 MG/ML IJ SOLN
INTRAMUSCULAR | Status: AC
  Filled 2021-05-30: qty 1

## 2021-05-30 MED ORDER — HEPARIN SODIUM (PORCINE) 5000 UNIT/ML IJ SOLN
5000.0000 [IU] | Freq: Once | INTRAMUSCULAR | Status: AC
  Administered 2021-05-30: 5000 [IU] via SUBCUTANEOUS
  Filled 2021-05-30: qty 1

## 2021-05-30 MED ORDER — PROMETHAZINE HCL 25 MG/ML IJ SOLN: 6.2500 mg | INTRAMUSCULAR | Status: DC | PRN

## 2021-05-30 MED ORDER — ONDANSETRON HCL 4 MG PO TABS: 4.0000 mg | ORAL_TABLET | Freq: Four times a day (QID) | ORAL | Status: DC | PRN

## 2021-05-30 MED ORDER — BUPIVACAINE HCL (PF) 0.25 % IJ SOLN
INTRAMUSCULAR | Status: AC
  Filled 2021-05-30: qty 30

## 2021-05-30 MED ORDER — CHLORHEXIDINE GLUCONATE CLOTH 2 % EX PADS: 6.0000 | MEDICATED_PAD | Freq: Once | CUTANEOUS | Status: DC

## 2021-05-30 MED ORDER — ROCURONIUM BROMIDE 10 MG/ML (PF) SYRINGE
PREFILLED_SYRINGE | INTRAVENOUS | Status: DC | PRN
  Administered 2021-05-30: 80 mg via INTRAVENOUS
  Administered 2021-05-30: 20 mg via INTRAVENOUS

## 2021-05-30 MED ORDER — DROPERIDOL 2.5 MG/ML IJ SOLN: 0.6250 mg | Freq: Once | INTRAMUSCULAR | Status: DC | PRN

## 2021-05-30 MED ORDER — ALBUMIN HUMAN 5 % IV SOLN: INTRAVENOUS | Status: DC | PRN

## 2021-05-30 MED ORDER — ONDANSETRON HCL 4 MG/2ML IJ SOLN
INTRAMUSCULAR | Status: DC | PRN
  Administered 2021-05-30: 4 mg via INTRAVENOUS

## 2021-05-30 MED ORDER — 0.9 % SODIUM CHLORIDE (POUR BTL) OPTIME
TOPICAL | Status: DC | PRN
  Administered 2021-05-30: 1000 mL

## 2021-05-30 MED ORDER — SIMETHICONE 80 MG PO CHEW: 40.0000 mg | CHEWABLE_TABLET | Freq: Four times a day (QID) | ORAL | Status: DC | PRN

## 2021-05-30 MED ORDER — DIPHENHYDRAMINE HCL 50 MG/ML IJ SOLN: 12.5000 mg | Freq: Four times a day (QID) | INTRAMUSCULAR | Status: DC | PRN

## 2021-05-30 MED ORDER — DEXTROSE IN LACTATED RINGERS 5 % IV SOLN: INTRAVENOUS | Status: DC

## 2021-05-30 MED ORDER — ROCURONIUM BROMIDE 10 MG/ML (PF) SYRINGE
PREFILLED_SYRINGE | INTRAVENOUS | Status: AC
  Filled 2021-05-30: qty 10

## 2021-05-30 MED ORDER — ONDANSETRON HCL 4 MG/2ML IJ SOLN
4.0000 mg | Freq: Four times a day (QID) | INTRAMUSCULAR | Status: DC | PRN
  Administered 2021-05-30: 4 mg via INTRAVENOUS
  Filled 2021-05-30: qty 2

## 2021-05-30 MED ORDER — LIDOCAINE-EPINEPHRINE 1 %-1:100000 IJ SOLN
INTRAMUSCULAR | Status: AC
  Filled 2021-05-30: qty 1

## 2021-05-30 MED ORDER — DIPHENHYDRAMINE HCL 12.5 MG/5ML PO ELIX: 12.5000 mg | ORAL_SOLUTION | Freq: Four times a day (QID) | ORAL | Status: DC | PRN

## 2021-05-30 MED ORDER — PROCHLORPERAZINE EDISYLATE 10 MG/2ML IJ SOLN: 5.0000 mg | Freq: Four times a day (QID) | INTRAMUSCULAR | Status: DC | PRN

## 2021-05-30 MED ORDER — HYDROMORPHONE HCL 1 MG/ML IJ SOLN
0.2500 mg | INTRAMUSCULAR | Status: DC | PRN
  Administered 2021-05-30 (×4): 0.25 mg via INTRAVENOUS

## 2021-05-30 MED ORDER — OXYCODONE HCL 5 MG/5ML PO SOLN
ORAL | Status: AC
  Filled 2021-05-30: qty 5

## 2021-05-30 MED ORDER — ACETAMINOPHEN 500 MG PO TABS
ORAL_TABLET | ORAL | Status: AC
  Administered 2021-05-30: 1000 mg via ORAL
  Filled 2021-05-30: qty 2

## 2021-05-30 MED ORDER — FENTANYL CITRATE (PF) 250 MCG/5ML IJ SOLN
INTRAMUSCULAR | Status: DC | PRN
  Administered 2021-05-30 (×3): 50 ug via INTRAVENOUS

## 2021-05-30 MED ORDER — SODIUM CHLORIDE 0.9 % IV SOLN
2.0000 g | INTRAVENOUS | Status: AC
  Administered 2021-05-30: 2 g via INTRAVENOUS
  Filled 2021-05-30: qty 2

## 2021-05-30 MED ORDER — ALVIMOPAN 12 MG PO CAPS
ORAL_CAPSULE | ORAL | Status: AC
  Administered 2021-05-30: 12 mg via ORAL
  Filled 2021-05-30: qty 1

## 2021-05-30 MED ORDER — MELATONIN 3 MG PO TABS: 3.0000 mg | ORAL_TABLET | Freq: Every evening | ORAL | Status: DC | PRN

## 2021-05-30 MED ORDER — ORAL CARE MOUTH RINSE: 15.0000 mL | Freq: Once | OROMUCOSAL | Status: AC

## 2021-05-30 MED ORDER — OXYCODONE HCL 5 MG PO TABS: 5.0000 mg | ORAL_TABLET | Freq: Once | ORAL | Status: AC | PRN

## 2021-05-30 MED ORDER — ENOXAPARIN SODIUM 40 MG/0.4ML IJ SOSY: 40.0000 mg | PREFILLED_SYRINGE | INTRAMUSCULAR | Status: DC

## 2021-05-30 SURGICAL SUPPLY — 73 items
ADH SKN CLS APL DERMABOND .7 (GAUZE/BANDAGES/DRESSINGS) ×1
APPLIER CLIP 5 13 M/L LIGAMAX5 (MISCELLANEOUS)
APPLIER CLIP ROT 10 11.4 M/L (STAPLE)
APR CLP MED LRG 11.4X10 (STAPLE)
APR CLP MED LRG 5 ANG JAW (MISCELLANEOUS)
BAG COUNTER SPONGE SURGICOUNT (BAG) ×2 IMPLANT
BAG SPNG CNTER NS LX DISP (BAG) ×1
BLADE CLIPPER SURG (BLADE) IMPLANT
CANISTER SUCT 3000ML PPV (MISCELLANEOUS) ×2 IMPLANT
CELLS DAT CNTRL 66122 CELL SVR (MISCELLANEOUS) IMPLANT
CLIP APPLIE 5 13 M/L LIGAMAX5 (MISCELLANEOUS) IMPLANT
CLIP APPLIE ROT 10 11.4 M/L (STAPLE) IMPLANT
COVER SURGICAL LIGHT HANDLE (MISCELLANEOUS) ×4 IMPLANT
DERMABOND ADVANCED (GAUZE/BANDAGES/DRESSINGS) ×1
DERMABOND ADVANCED .7 DNX12 (GAUZE/BANDAGES/DRESSINGS) IMPLANT
DRAPE WARM FLUID 44X44 (DRAPES) ×2 IMPLANT
DRSG OPSITE POSTOP 3X4 (GAUZE/BANDAGES/DRESSINGS) ×1 IMPLANT
DRSG OPSITE POSTOP 4X10 (GAUZE/BANDAGES/DRESSINGS) IMPLANT
DRSG OPSITE POSTOP 4X8 (GAUZE/BANDAGES/DRESSINGS) IMPLANT
ELECT CAUTERY BLADE 6.4 (BLADE) ×4 IMPLANT
ELECT REM PT RETURN 9FT ADLT (ELECTROSURGICAL) ×2
ELECTRODE REM PT RTRN 9FT ADLT (ELECTROSURGICAL) ×1 IMPLANT
GEL ULTRASOUND 20GR AQUASONIC (MISCELLANEOUS) IMPLANT
GLOVE SURG ENC MOIS LTX SZ6 (GLOVE) ×4 IMPLANT
GLOVE SURG UNDER LTX SZ6.5 (GLOVE) ×4 IMPLANT
GOWN STRL REUS W/ TWL LRG LVL3 (GOWN DISPOSABLE) ×6 IMPLANT
GOWN STRL REUS W/TWL 2XL LVL3 (GOWN DISPOSABLE) ×4 IMPLANT
GOWN STRL REUS W/TWL LRG LVL3 (GOWN DISPOSABLE) ×12
KIT SIGMOIDOSCOPE (SET/KITS/TRAYS/PACK) IMPLANT
KIT TURNOVER KIT B (KITS) ×2 IMPLANT
L-HOOK LAP DISP 36CM (ELECTROSURGICAL) ×2
LHOOK LAP DISP 36CM (ELECTROSURGICAL) ×1 IMPLANT
LIGASURE IMPACT 36 18CM CVD LR (INSTRUMENTS) IMPLANT
NS IRRIG 1000ML POUR BTL (IV SOLUTION) ×4 IMPLANT
PACK COLON (CUSTOM PROCEDURE TRAY) ×2 IMPLANT
PAD ARMBOARD 7.5X6 YLW CONV (MISCELLANEOUS) ×2 IMPLANT
PENCIL BUTTON HOLSTER BLD 10FT (ELECTRODE) ×4 IMPLANT
RETRACTOR WND ALEXIS 18 MED (MISCELLANEOUS) IMPLANT
RTRCTR WOUND ALEXIS 18CM MED (MISCELLANEOUS)
SCISSORS LAP 5X35 DISP (ENDOMECHANICALS) ×1 IMPLANT
SEALER TISSUE G2 STRG ARTC 35C (ENDOMECHANICALS) ×2 IMPLANT
SET IRRIG TUBING LAPAROSCOPIC (IRRIGATION / IRRIGATOR) ×2 IMPLANT
SET TUBE SMOKE EVAC HIGH FLOW (TUBING) ×1 IMPLANT
SLEEVE ENDOPATH XCEL 5M (ENDOMECHANICALS) ×5 IMPLANT
SPECIMEN JAR LARGE (MISCELLANEOUS) ×2 IMPLANT
SPONGE T-LAP 18X18 ~~LOC~~+RFID (SPONGE) ×1 IMPLANT
STAPLER ECHELON POWER CIR 29 (STAPLE) ×1 IMPLANT
STAPLER VISISTAT 35W (STAPLE) IMPLANT
SURGILUBE 2OZ TUBE FLIPTOP (MISCELLANEOUS) IMPLANT
SUT MNCRL AB 4-0 PS2 18 (SUTURE) IMPLANT
SUT NOVA NAB DX-16 0-1 5-0 T12 (SUTURE) ×1 IMPLANT
SUT PDS AB 1 CT  36 (SUTURE)
SUT PDS AB 1 CT 36 (SUTURE) IMPLANT
SUT PROLENE 2 0 CT2 30 (SUTURE) IMPLANT
SUT PROLENE 2 0 KS (SUTURE) IMPLANT
SUT SILK 4 0 CR 8 RB 1 (SUTURE) ×1 IMPLANT
SUT VIC AB 2-0 SH 18 (SUTURE) ×2 IMPLANT
SUT VIC AB 3-0 SH 18 (SUTURE) ×2 IMPLANT
SUT VICRYL AB 2 0 TIES (SUTURE) ×2 IMPLANT
SUT VICRYL AB 3 0 TIES (SUTURE) ×2 IMPLANT
SYS LAPSCP GELPORT 120MM (MISCELLANEOUS)
SYS WOUND ALEXIS 18CM MED (MISCELLANEOUS) ×2
SYSTEM LAPSCP GELPORT 120MM (MISCELLANEOUS) IMPLANT
SYSTEM WOUND ALEXIS 18CM MED (MISCELLANEOUS) IMPLANT
TRAY FOLEY MTR SLVR 16FR STAT (SET/KITS/TRAYS/PACK) ×1 IMPLANT
TROCAR XCEL 12X100 BLDLESS (ENDOMECHANICALS) IMPLANT
TROCAR XCEL BLUNT TIP 100MML (ENDOMECHANICALS) IMPLANT
TROCAR XCEL NON-BLD 11X100MML (ENDOMECHANICALS) IMPLANT
TROCAR XCEL NON-BLD 5MMX100MML (ENDOMECHANICALS) ×2 IMPLANT
TUBE CONNECTING 12X1/4 (SUCTIONS) ×4 IMPLANT
TUBING EVAC SMOKE HEATED PNEUM (TUBING) ×1 IMPLANT
WARMER LAPAROSCOPE (MISCELLANEOUS) ×2 IMPLANT
WATER STERILE IRR 1000ML POUR (IV SOLUTION) ×2 IMPLANT

## 2021-05-30 NOTE — Anesthesia Procedure Notes (Signed)
Procedure Name: Intubation Date/Time: 05/30/2021 10:12 AM Performed by: Kathryne Hitch, CRNA Pre-anesthesia Checklist: Patient identified, Emergency Drugs available, Suction available and Patient being monitored Patient Re-evaluated:Patient Re-evaluated prior to induction Oxygen Delivery Method: Circle system utilized Preoxygenation: Pre-oxygenation with 100% oxygen Induction Type: IV induction Ventilation: Mask ventilation without difficulty Laryngoscope Size: Miller and 3 Grade View: Grade I Tube type: Oral Tube size: 7.0 mm Number of attempts: 1 Airway Equipment and Method: Stylet and Oral airway Placement Confirmation: ETT inserted through vocal cords under direct vision, positive ETCO2 and breath sounds checked- equal and bilateral Secured at: 21 cm Tube secured with: Tape Dental Injury: Teeth and Oropharynx as per pre-operative assessment

## 2021-05-30 NOTE — Op Note (Signed)
PRE-OPERATIVE DIAGNOSIS: colostomy in place.  POST-OPERATIVE DIAGNOSIS:  Same  PROCEDURE:  Procedure(s): Laparoscopic colostomy takedown with resection of colon  SURGEON:  Surgeon(s): [name], MD  ASSISTANT:   Leighton Ruff, MD  ANESTHESIA:   general  DRAINS: none   LOCAL MEDICATIONS USED:  NONE  SPECIMEN:  Source of Specimen:  colostomy and anastamotic rings  DISPOSITION OF SPECIMEN:  PATHOLOGY  COUNTS:  YES  DICTATION: .Dragon Dictation  PLAN OF CARE: Admit to inpatient   PATIENT DISPOSITION:  PACU - hemodynamically stable.   EBL: <50 mL  FINDINGS: filmy adhesions throughout lower abdomen   PROCEDURE:  Patient was identified in the holding area and taken to the operating room where she was placed supine on the operating room table. General anesthesia was induced. A Foley catheter was placed. The patient was then placed in the low lithotomy position.   Patient's abdomen was prepped and draped in sterile fashion. Timeout was performed according to the surgical safety checklist. When all was correct, we continued.  The patient was placed into reverse trendelenburg position and rotated to the right.  An 8 mm incision was made at the costal margin after administration of local and a 5 mm Optiview port was placed under direct visualization.  Pneumoperitoneum was achieved to a pressure of 15 mm Hg.  An additional trocar was placed into the left mid abdomen and the omental adhesions were taken down in the upper abdomen.  Sharp dissection and the enseal were used.    The omentum was elevated superiorly as possible. Once the adhesions were freed up in the upper abdomen and right abdomen, two RLQ 5 mm trocars were placed.  The prolene sutures on the rectal stump were identified and elevated. The mesorectal space was opened sharply and the dissection was performed bluntly and with the enseal. The sizers were placed from below to assess if there were any portions of the rectal stump  that were tethered. None were seen.    At this point, the ostomy was then addressed. Local was infiltrated around the stoma site.  A circumferential incision was made around the ostomy with a #15 blade. The cautery was used to dissect the ostomy from the surrounding tissues. The Allis clamps were used to elevate the ostomy. A tonsil was then used to separate the subcutaneous fat from the ostomy wall. The wound protractor was placed at the stoma site.   The ostomy site was then resected. The sizers were used to decide that the 29 mm EEA was the appropriate stapler this patient. The pursestring device was used to create the prolene pursestring sutures.  4-0 silks were used to reinforce the staple line. The anvil was then placed into the distal descending colon and the suture was tied down. The colon was placed back into the abdomen and the cap was placed.  Pneumoperitoneum was reachieved.  The colon was checked to make sure there is adequate distance and no tension.   I then went below and advanced the EEA stapler into the rectum. The vagina was checked to make sure stapler was in the rectum. The spike was then deployed through the rectal wall. This was coupled with the handle. Care was taken to make sure that the colon was not twisted. This tightened down. The vagina was then rechecked to ensure that the stapler was removed from the vagina and incorporating the vagina wall and to the anterior stapler. The stapler was then fired. Pressure was held for one minute. The  stapler was then opened and very gently removed. The glassman was placed on the descending colon and irrigation was placed in the pelvis. I insufflated the rectum with the proctoscope.  There was no sign of leakage. I evaluated the anastomotic location and there was no sign of bleeding. This appear intact. The anastomotic rings were then examined and both were seen to be intact.    The clean colon protocol was then followed.  The fascial defect  was closed primarily in 2 layers. The peritoneum was closed with 0-0 vicryl.  The fascia was then closed with interrupted #1 Novafil pops. There was no residual fascial defect. The skin was closed on the trocar sited with subcuticular sutures and a pursestring suture of 4-0 monocryl was placed at the stoma site.   The patient was allowed to emerge from anesthesia. The  was taken to the PACU in stable condition. Needle, sponge, and instrument counts were correct x2.

## 2021-05-30 NOTE — Transfer of Care (Signed)
Immediate Anesthesia Transfer of Care Note  Patient: Stephanie [name]  Procedure(s) Performed: LAPAROSCOPIC ASSISTED COLOSTOMY TAKEDOWN (Abdomen)  Patient Location: PACU  Anesthesia Type:General  Level of Consciousness: drowsy and patient cooperative  Airway & Oxygen Therapy: Patient Spontanous Breathing and Patient connected to face mask oxygen  Post-op Assessment: Report given to RN and Post -op Vital signs reviewed and stable  Post vital signs: Reviewed and stable  Last Vitals:  Vitals Value Taken Time  BP 122/68 05/30/21 1302  Temp    Pulse 90 05/30/21 1304  Resp 15 05/30/21 1304  SpO2 100 % 05/30/21 1304  Vitals shown include unvalidated device data.  Last Pain:  Vitals:   05/30/21 0831  TempSrc:   PainSc: 0-No pain         Complications: No notable events documented.

## 2021-05-30 NOTE — Interval H&P Note (Signed)
History and Physical Interval Note:  05/30/2021 9:25 AM  Stephanie [name]  has presented today for surgery, with the diagnosis of COLON CANCER.  The various methods of treatment have been discussed with the patient and family. After consideration of risks, benefits and other options for treatment, the patient has consented to  Procedure(s): LAPAROSCOPIC ASSISTED COLOSTOMY TAKEDOWN (N/A) as a surgical intervention.  The patient's history has been reviewed, patient examined, no change in status, stable for surgery.  I have reviewed the patient's chart and labs.  Questions were answered to the patient's satisfaction.     [name]

## 2021-05-30 NOTE — Progress Notes (Signed)
New admission to the unit due to continuation of care. Patient is alert and oriented. Bed is at the lowest position. Call light place within reach.

## 2021-05-30 NOTE — Plan of Care (Signed)
  Problem: Activity: Goal: Risk for activity intolerance will decrease Outcome: Progressing   Problem: Nutrition: Goal: Adequate nutrition will be maintained Outcome: Progressing   Problem: Coping: Goal: Level of anxiety will decrease Outcome: Progressing   Problem: Pain Managment: Goal: General experience of comfort will improve Outcome: Progressing   Problem: Safety: Goal: Ability to remain free from injury will improve Outcome: Progressing   Problem: Skin Integrity: Goal: Risk for impaired skin integrity will decrease Outcome: Progressing   Problem: Clinical Measurements: Goal: Ability to maintain clinical measurements within normal limits will improve Outcome: Progressing   Problem: Skin Integrity: Goal: Demonstration of wound healing without infection will improve Outcome: Progressing

## 2021-05-31 ENCOUNTER — Encounter (HOSPITAL_COMMUNITY): Payer: Self-pay | Admitting: General Surgery

## 2021-05-31 LAB — BASIC METABOLIC PANEL
Anion gap: 6 (ref 5–15)
BUN: 7 mg/dL (ref 6–20)
CO2: 23 mmol/L (ref 22–32)
Calcium: 8.2 mg/dL — ABNORMAL LOW (ref 8.9–10.3)
Chloride: 107 mmol/L (ref 98–111)
Creatinine, Ser: 0.7 mg/dL (ref 0.44–1.00)
GFR, Estimated: >60 mL/min (ref >60)
Glucose, Bld: 126 mg/dL — ABNORMAL HIGH (ref 70–99)
Potassium: 4.4 mmol/L (ref 3.5–5.1)
Sodium: 136 mmol/L (ref 135–145)

## 2021-05-31 LAB — CBC
HCT: 37.2 % (ref 36.0–46.0)
Hemoglobin: 12.4 g/dL (ref 12.0–15.0)
MCH: 31.1 pg (ref 26.0–34.0)
MCHC: 33.3 g/dL (ref 30.0–36.0)
MCV: 93.2 fL (ref 80.0–100.0)
Platelets: 84 10*3/uL — ABNORMAL LOW (ref 150–400)
RBC: 3.99 MIL/uL (ref 3.87–5.11)
RDW: 13.8 % (ref 11.5–15.5)
WBC: 6.1 10*3/uL (ref 4.0–10.5)
nRBC: 0 % (ref 0.0–0.2)

## 2021-05-31 MED ORDER — METHOCARBAMOL 500 MG PO TABS: 500.0000 mg | ORAL_TABLET | Freq: Three times a day (TID) | ORAL | 1 refills | Status: DC | PRN

## 2021-05-31 MED ORDER — METHOCARBAMOL 500 MG PO TABS
500.0000 mg | ORAL_TABLET | Freq: Three times a day (TID) | ORAL | Status: DC | PRN
  Administered 2021-05-31 – 2021-06-03 (×2): 500 mg via ORAL
  Filled 2021-05-31 (×2): qty 1

## 2021-05-31 MED ORDER — OXYCODONE HCL 5 MG PO TABS: 5.0000 mg | ORAL_TABLET | ORAL | 0 refills | Status: DC | PRN

## 2021-05-31 MED ORDER — ACETAMINOPHEN 325 MG PO TABS: 650.0000 mg | ORAL_TABLET | Freq: Four times a day (QID) | ORAL | Status: AC | PRN

## 2021-05-31 NOTE — Progress Notes (Signed)
1 Day Post-Op   Subjective/Chief Complaint: Had some nausea yesterday.  Feels sore.  Passed some gas.     Objective: Vital signs in last 24 hours: Temp:  [97.4 F (36.3 C)-98.8 F (37.1 C)] 98.8 F (37.1 C) (09/02 0742) Pulse Rate:  [59-90] 61 (09/02 0742) Resp:  [12-17] 15 (09/02 0742) BP: (102-168)/(54-78) 102/55 (09/02 0742) SpO2:  [97 %-100 %] 99 % (09/02 0742) Last BM Date: 05/29/21  Intake/Output from previous day: 09/01 0701 - 09/02 0700 In: 2915.6 [I.V.:2315.6; IV Piggyback:600] Out: 2525 [Urine:2425; Blood:100] Intake/Output this shift: No intake/output data recorded.  General appearance: alert, cooperative, and mild distress Resp: breathing comfortably GI: soft, non distended, approp tender, small amount dried blood on former ostomy site.    Lab Results:  Recent Labs    05/30/21 1607 05/31/21 0036  WBC 5.4 6.1  HGB 13.1 12.4  HCT 39.3 37.2  PLT 74* 84*   BMET Recent Labs    05/30/21 1607 05/31/21 0036  NA  --  136  K  --  4.4  CL  --  107  CO2  --  23  GLUCOSE  --  126*  BUN  --  7  CREATININE 0.75 0.70  CALCIUM  --  8.2*   PT/INR No results for input(s): LABPROT, INR in the last 72 hours. ABG No results for input(s): PHART, HCO3 in the last 72 hours.  Invalid input(s): PCO2, PO2  Studies/Results: No results found.  Anti-infectives: Anti-infectives (From admission, onward)    Start     Dose/Rate Route Frequency Ordered Stop   05/30/21 2200  cefoTEtan (CEFOTAN) 2 g in sodium chloride 0.9 % 100 mL IVPB        2 g 200 mL/hr over 30 Minutes Intravenous Every 12 hours 05/30/21 1522 05/30/21 2245   05/30/21 0815  cefoTEtan (CEFOTAN) 2 g in sodium chloride 0.9 % 100 mL IVPB        2 g 200 mL/hr over 30 Minutes Intravenous On call to O.R. 05/30/21 0811 05/30/21 1030   05/30/21 0815  clindamycin (CLEOCIN) 900 mg, gentamicin (GARAMYCIN) 240 mg in sodium chloride 0.9 % 1,000 mL for intraperitoneal lavage  Status:  Discontinued          Irrigation To Surgery 05/30/21 0811 05/30/21 1513       Assessment/Plan: s/p Procedure(s): LAPAROSCOPIC ASSISTED COLOSTOMY TAKEDOWN (N/A) 05/30/21 Stephanie [name] d/c foley Prn oxy, dilaudid for pain.  Mobic once daily.  Gabapentin low dose BID Advance diet.  OOB Thrombocytopenia - hold on Lovenox given low platelets.  Hope to start tomorrow. SCDs, ambulate   LOS: 1 day    [name] 05/31/2021

## 2021-05-31 NOTE — Anesthesia Postprocedure Evaluation (Signed)
Anesthesia Post Note  Patient: Stephanie [name]  Procedure(s) Performed: LAPAROSCOPIC ASSISTED COLOSTOMY TAKEDOWN (Abdomen)     Patient location during evaluation: PACU Anesthesia Type: General Level of consciousness: awake Pain management: pain level controlled Vital Signs Assessment: post-procedure vital signs reviewed and stable Respiratory status: spontaneous breathing and respiratory function stable Cardiovascular status: stable Postop Assessment: no apparent nausea or vomiting Anesthetic complications: no   No notable events documented.  Last Vitals:  Vitals:   05/31/21 1233 05/31/21 1709  BP: (!) 101/56 112/72  Pulse: 70 65  Resp: 15 17  Temp: 37.2 C 36.7 C  SpO2: 98% 100%    Last Pain:  Vitals:   05/31/21 1800  TempSrc:   PainSc: 8    Pain Goal: Patients Stated Pain Goal: 2 (05/31/21 1725)                 Merlinda Frederick

## 2021-05-31 NOTE — Discharge Instructions (Signed)
Change ostomy site dressing daily.  OK to remove and shower.    [facility] Office Phone Number [phone]   POST OP INSTRUCTIONS  Always review your discharge instruction sheet given to you by the facility where your surgery was performed.  IF YOU HAVE DISABILITY OR FAMILY LEAVE FORMS, YOU MUST BRING THEM TO THE OFFICE FOR PROCESSING.  DO NOT GIVE THEM TO YOUR DOCTOR.  A prescription for pain medication may be given to you upon discharge.  Take your pain medication as prescribed, if needed.  If narcotic pain medicine is not needed, then you may take acetaminophen (Tylenol) or ibuprofen (Advil) as needed. Take your usually prescribed medications unless otherwise directed If you need a refill on your pain medication, please contact your pharmacy.  They will contact our office to request authorization.  Prescriptions will not be filled after 5pm or on week-ends. You should eat very light the first 24 hours after surgery, such as soup, crackers, pudding, etc.  Resume your normal diet the day after surgery It is common to experience some constipation if taking pain medication after surgery.  Increasing fluid intake and taking a stool softener will usually help or prevent this problem from occurring.  A mild laxative (Milk of Magnesia or Miralax) should be taken according to package directions if there are no bowel movements after 48 hours. You may shower in 48 hours.  The surgical glue will flake off in 2-3 weeks.   ACTIVITIES:  No strenuous activity or heavy lifting for 6 weeks.   You may drive when you no longer are taking prescription pain medication, you can comfortably wear a seatbelt, and you can safely maneuver your car and apply brakes. RETURN TO WORK:  __________to be determined_______________ Stephanie [name] should see your doctor in the office for a follow-up appointment approximately three-four weeks after your surgery.    WHEN TO CALL YOUR DOCTOR: Fever over 101.0 Nausea and/or  vomiting. Extreme swelling or bruising. Continued bleeding from incision. Increased pain, redness, or drainage from the incision.  The clinic staff is available to answer your questions during regular business hours.  Please don't hesitate to call and ask to speak to one of the nurses for clinical concerns.  If you have a medical emergency, go to the nearest emergency room or call 911.  A surgeon from [facility] is always on call at the hospital.  For further questions, please visit [email or web address]

## 2021-05-31 NOTE — Plan of Care (Signed)
?  Problem: Clinical Measurements: ?Goal: Will remain free from infection ?Outcome: Progressing ?  ?

## 2021-05-31 NOTE — Progress Notes (Addendum)
MD made aware of red-blood passed by patient from rectum. She is asymptomatic.. Will continue to monitor.  Per MD, it is ok. Will watch out for lab tomorrow.

## 2021-06-01 LAB — BASIC METABOLIC PANEL
Anion gap: 8 (ref 5–15)
BUN: 6 mg/dL (ref 6–20)
CO2: 27 mmol/L (ref 22–32)
Calcium: 8.5 mg/dL — ABNORMAL LOW (ref 8.9–10.3)
Chloride: 103 mmol/L (ref 98–111)
Creatinine, Ser: 0.61 mg/dL (ref 0.44–1.00)
GFR, Estimated: >60 mL/min (ref >60)
Glucose, Bld: 101 mg/dL — ABNORMAL HIGH (ref 70–99)
Potassium: 3.8 mmol/L (ref 3.5–5.1)
Sodium: 138 mmol/L (ref 135–145)

## 2021-06-01 LAB — CBC
HCT: 38.6 % (ref 36.0–46.0)
Hemoglobin: 12.7 g/dL (ref 12.0–15.0)
MCH: 30.8 pg (ref 26.0–34.0)
MCHC: 32.9 g/dL (ref 30.0–36.0)
MCV: 93.5 fL (ref 80.0–100.0)
Platelets: 87 10*3/uL — ABNORMAL LOW (ref 150–400)
RBC: 4.13 MIL/uL (ref 3.87–5.11)
RDW: 13.9 % (ref 11.5–15.5)
WBC: 5.2 10*3/uL (ref 4.0–10.5)
nRBC: 0 % (ref 0.0–0.2)

## 2021-06-01 NOTE — Progress Notes (Signed)
Patient ID: Stephanie [name], female   DOB: [birth year], 40 y.o.   MRN: [id number] [facility] Surgery Progress Note:   2 Days Post-Op  Subjective: Mental status is clear.  Complaints none. Objective: Vital signs in last 24 hours: Temp:  [98 F (36.7 C)-99.6 F (37.6 C)] 98 F (36.7 C) (09/03 0452) Pulse Rate:  [62-70] 62 (09/03 0452) Resp:  [15-17] 15 (09/03 0452) BP: (101-113)/(56-72) 102/60 (09/03 0452) SpO2:  [97 %-100 %] 97 % (09/03 0452) Weight:  [71.9 kg] 71.9 kg (09/03 0453)  Intake/Output from previous day: 09/02 0701 - 09/03 0700 In: 360 [P.O.:360] Out: -  Intake/Output this shift: No intake/output data recorded.  Physical Exam: Work of breathing is OK.  Comfortable at rest.  No flatus yet.    Lab Results:  Results for orders placed or performed during the hospital encounter of 05/30/21 (from the past 48 hour(s))  CBC     Status: Abnormal   Collection Time: 05/30/21  4:07 PM  Result Value Ref Range   WBC 5.4 4.0 - 10.5 K/uL   RBC 4.21 3.87 - 5.11 MIL/uL   Hemoglobin 13.1 12.0 - 15.0 g/dL   HCT 39.3 36.0 - 46.0 %   MCV 93.3 80.0 - 100.0 fL   MCH 31.1 26.0 - 34.0 pg   MCHC 33.3 30.0 - 36.0 g/dL   RDW 13.9 11.5 - 15.5 %   Platelets 74 (L) 150 - 400 K/uL    Comment: Immature Platelet Fraction may be clinically indicated, consider ordering this additional test [id number] REPEATED TO VERIFY    nRBC 0.0 0.0 - 0.2 %    Comment: Performed at [facility], [place] [address], [place], [place] [address]  Creatinine, serum     Status: None   Collection Time: 05/30/21  4:07 PM  Result Value Ref Range   Creatinine, Ser 0.75 0.44 - 1.00 mg/dL   GFR, Estimated >60 >60 mL/min    Comment: (NOTE) Calculated using the CKD-EPI Creatinine Equation (2021) Performed at [facility] [address] [place], [place] [id number]   Basic metabolic panel     Status: Abnormal   Collection Time: 05/31/21 12:36 AM  Result Value Ref Range   Sodium 136 135 - 145 mmol/L    Potassium 4.4 3.5 - 5.1 mmol/L   Chloride 107 98 - 111 mmol/L   CO2 23 22 - 32 mmol/L   Glucose, Bld 126 (H) 70 - 99 mg/dL    Comment: Glucose reference range applies only to samples taken after fasting for at least 8 hours.   BUN 7 6 - 20 mg/dL   Creatinine, Ser 0.70 0.44 - 1.00 mg/dL   Calcium 8.2 (L) 8.9 - 10.3 mg/dL   GFR, Estimated >60 >60 mL/min    Comment: (NOTE) Calculated using the CKD-EPI Creatinine Equation (2021)    Anion gap 6 5 - 15    Comment: Performed at [facility] [address] [place] [address]  CBC     Status: Abnormal   Collection Time: 05/31/21 12:36 AM  Result Value Ref Range   WBC 6.1 4.0 - 10.5 K/uL   RBC 3.99 3.87 - 5.11 MIL/uL   Hemoglobin 12.4 12.0 - 15.0 g/dL   HCT 37.2 36.0 - 46.0 %   MCV 93.2 80.0 - 100.0 fL   MCH 31.1 26.0 - 34.0 pg   MCHC 33.3 30.0 - 36.0 g/dL   RDW 13.8 11.5 - 15.5 %   Platelets  84 (L) 150 - 400 K/uL    Comment: Immature Platelet Fraction may be clinically indicated, consider ordering this additional test [id number] CONSISTENT WITH PREVIOUS RESULT REPEATED TO VERIFY    nRBC 0.0 0.0 - 0.2 %    Comment: Performed at [facility], [place] [address], [place], [place] [id number]  Basic metabolic panel     Status: Abnormal   Collection Time: 06/01/21 12:31 AM  Result Value Ref Range   Sodium 138 135 - 145 mmol/L   Potassium 3.8 3.5 - 5.1 mmol/L   Chloride 103 98 - 111 mmol/L   CO2 27 22 - 32 mmol/L   Glucose, Bld 101 (H) 70 - 99 mg/dL    Comment: Glucose reference range applies only to samples taken after fasting for at least 8 hours.   BUN 6 6 - 20 mg/dL   Creatinine, Ser 0.61 0.44 - 1.00 mg/dL   Calcium 8.5 (L) 8.9 - 10.3 mg/dL   GFR, Estimated >60 >60 mL/min    Comment: (NOTE) Calculated using the CKD-EPI Creatinine Equation (2021)    Anion gap 8 5 - 15    Comment: Performed at [facility] [address], [place], [place] [address]  CBC     Status: Abnormal   Collection Time: 06/01/21  12:31 AM  Result Value Ref Range   WBC 5.2 4.0 - 10.5 K/uL   RBC 4.13 3.87 - 5.11 MIL/uL   Hemoglobin 12.7 12.0 - 15.0 g/dL   HCT 38.6 36.0 - 46.0 %   MCV 93.5 80.0 - 100.0 fL   MCH 30.8 26.0 - 34.0 pg   MCHC 32.9 30.0 - 36.0 g/dL   RDW 13.9 11.5 - 15.5 %   Platelets 87 (L) 150 - 400 K/uL    Comment: Immature Platelet Fraction may be clinically indicated, consider ordering this additional test [id number] CONSISTENT WITH PREVIOUS RESULT REPEATED TO VERIFY    nRBC 0.0 0.0 - 0.2 %    Comment: Performed at [facility], [place] [address] [place], [place] [address]    Radiology/Results: No results found.  Anti-infectives: Anti-infectives (From admission, onward)    Start     Dose/Rate Route Frequency Ordered Stop   05/30/21 2200  cefoTEtan (CEFOTAN) 2 g in sodium chloride 0.9 % 100 mL IVPB        2 g 200 mL/hr over 30 Minutes Intravenous Every 12 hours 05/30/21 1522 05/30/21 2245   05/30/21 0815  cefoTEtan (CEFOTAN) 2 g in sodium chloride 0.9 % 100 mL IVPB        2 g 200 mL/hr over 30 Minutes Intravenous On call to O.R. 05/30/21 0811 05/30/21 1030   05/30/21 0815  clindamycin (CLEOCIN) 900 mg, gentamicin (GARAMYCIN) 240 mg in sodium chloride 0.9 % 1,000 mL for intraperitoneal lavage  Status:  Discontinued         Irrigation To Surgery 05/30/21 0811 05/30/21 1513       Assessment/Plan: Problem List: Patient Active Problem List   Diagnosis Date Noted   Rheumatic fever without heart involvement 05/30/2021   Colorectal carcinoma ([place]) 03/08/2021   Genetic testing 01/28/2021   Abdominal fluid collection 12/31/2020   Metastatic colon cancer to liver ([place]) 12/13/2020   Family history of bladder cancer    Port-A-Cath in place 05/03/2020   Sepsis ([place]) 04/04/2020   SOB (shortness of breath) 04/04/2020   Moderate protein-calorie malnutrition ([place]) 04/04/2020   Nausea & vomiting 04/04/2020   TOA (tubo-ovarian abscess)  Intra-abdominal abscess ([place]) 03/31/2020   Lactose  intolerance 03/12/2020   Microcytic anemia 03/12/2020   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  Won't advance until flatus.   2 Days Post-Op    LOS: 2 days   Matt B. Hassell Done, MD, [facility] [phone] to reach the surgeon on call.    06/01/2021 9:39 AM

## 2021-06-02 LAB — BASIC METABOLIC PANEL
Anion gap: 8 (ref 5–15)
BUN: 8 mg/dL (ref 6–20)
CO2: 28 mmol/L (ref 22–32)
Calcium: 9 mg/dL (ref 8.9–10.3)
Chloride: 102 mmol/L (ref 98–111)
Creatinine, Ser: 0.59 mg/dL (ref 0.44–1.00)
GFR, Estimated: >60 mL/min (ref >60)
Glucose, Bld: 91 mg/dL (ref 70–99)
Potassium: 3.9 mmol/L (ref 3.5–5.1)
Sodium: 138 mmol/L (ref 135–145)

## 2021-06-02 LAB — CBC
HCT: 39.5 % (ref 36.0–46.0)
Hemoglobin: 13.2 g/dL (ref 12.0–15.0)
MCH: 30.6 pg (ref 26.0–34.0)
MCHC: 33.4 g/dL (ref 30.0–36.0)
MCV: 91.6 fL (ref 80.0–100.0)
Platelets: 98 10*3/uL — ABNORMAL LOW (ref 150–400)
RBC: 4.31 MIL/uL (ref 3.87–5.11)
RDW: 13.6 % (ref 11.5–15.5)
WBC: 4 10*3/uL (ref 4.0–10.5)
nRBC: 0 % (ref 0.0–0.2)

## 2021-06-02 NOTE — Progress Notes (Signed)
Patient ID: Stephanie [name], female   DOB: Sep 28, 1981, 40 y.o.   MRN: [id number] [facility] Surgery Progress Note:   3 Days Post-Op  Subjective: Mental status is alert and feeling better.  Complaints hasn't had a BM yet. Objective: Vital signs in last 24 hours: Temp:  [98.1 F (36.7 C)-98.7 F (37.1 C)] 98.7 F (37.1 C) (09/04 0827) Pulse Rate:  [52-59] 55 (09/04 0827) Resp:  [16-18] 16 (09/04 0827) BP: (103-105)/(57-71) 103/61 (09/04 0827) SpO2:  [96 %-99 %] 98 % (09/04 0827) Weight:  [72.4 kg] 72.4 kg (09/04 0341)  Intake/Output from previous day: 09/03 0701 - 09/04 0700 In: 240 [P.O.:240] Out: -  Intake/Output this shift: No intake/output data recorded.  Physical Exam: Work of breathing is OK.  Telfa wick removed from ostomy site.  Taking soft diet.  Improved  Lab Results:  Results for orders placed or performed during the hospital encounter of 05/30/21 (from the past 48 hour(s))  Basic metabolic panel     Status: Abnormal   Collection Time: 06/01/21 12:31 AM  Result Value Ref Range   Sodium 138 135 - 145 mmol/L   Potassium 3.8 3.5 - 5.1 mmol/L   Chloride 103 98 - 111 mmol/L   CO2 27 22 - 32 mmol/L   Glucose, Bld 101 (H) 70 - 99 mg/dL    Comment: Glucose reference range applies only to samples taken after fasting for at least 8 hours.   BUN 6 6 - 20 mg/dL   Creatinine, Ser 0.61 0.44 - 1.00 mg/dL   Calcium 8.5 (L) 8.9 - 10.3 mg/dL   GFR, Estimated >60 >60 mL/min    Comment: (NOTE) Calculated using the CKD-EPI Creatinine Equation (2021)    Anion gap 8 5 - 15    Comment: Performed at [facility] [address], [place], [place] [address]  CBC     Status: Abnormal   Collection Time: 06/01/21 12:31 AM  Result Value Ref Range   WBC 5.2 4.0 - 10.5 K/uL   RBC 4.13 3.87 - 5.11 MIL/uL   Hemoglobin 12.7 12.0 - 15.0 g/dL   HCT 38.6 36.0 - 46.0 %   MCV 93.5 80.0 - 100.0 fL   MCH 30.8 26.0 - 34.0 pg   MCHC 32.9 30.0 - 36.0 g/dL   RDW 13.9 11.5 - 15.5 %    Platelets 87 (L) 150 - 400 K/uL    Comment: Immature Platelet Fraction may be clinically indicated, consider ordering this additional test [id number] CONSISTENT WITH PREVIOUS RESULT REPEATED TO VERIFY    nRBC 0.0 0.0 - 0.2 %    Comment: Performed at [facility], [place] [address], [place],  [id number]  Basic metabolic panel     Status: None   Collection Time: 06/02/21  2:23 AM  Result Value Ref Range   Sodium 138 135 - 145 mmol/L   Potassium 3.9 3.5 - 5.1 mmol/L   Chloride 102 98 - 111 mmol/L   CO2 28 22 - 32 mmol/L   Glucose, Bld 91 70 - 99 mg/dL    Comment: Glucose reference range applies only to samples taken after fasting for at least 8 hours.   BUN 8 6 - 20 mg/dL   Creatinine, Ser 0.59 0.44 - 1.00 mg/dL   Calcium 9.0 8.9 - 10.3 mg/dL   GFR, Estimated >60 >60 mL/min    Comment: (NOTE) Calculated using the CKD-EPI Creatinine Equation (2021)    Anion gap 8 5 - 15  Comment: Performed at [facility], [place] [address] [place], [place] [address]  CBC     Status: Abnormal   Collection Time: 06/02/21  2:23 AM  Result Value Ref Range   WBC 4.0 4.0 - 10.5 K/uL   RBC 4.31 3.87 - 5.11 MIL/uL   Hemoglobin 13.2 12.0 - 15.0 g/dL   HCT 39.5 36.0 - 46.0 %   MCV 91.6 80.0 - 100.0 fL   MCH 30.6 26.0 - 34.0 pg   MCHC 33.4 30.0 - 36.0 g/dL   RDW 13.6 11.5 - 15.5 %   Platelets 98 (L) 150 - 400 K/uL    Comment: Immature Platelet Fraction may be clinically indicated, consider ordering this additional test [id number] CONSISTENT WITH PREVIOUS RESULT REPEATED TO VERIFY    nRBC 0.0 0.0 - 0.2 %    Comment: Performed at [facility], [place] [address] [place],  [address]    Radiology/Results: No results found.  Anti-infectives: Anti-infectives (From admission, onward)    Start     Dose/Rate Route Frequency Ordered Stop   05/30/21 2200  cefoTEtan (CEFOTAN) 2 g in sodium chloride 0.9 % 100 mL IVPB        2 g 200 mL/hr over 30 Minutes Intravenous Every 12 hours  05/30/21 1522 05/30/21 2245   05/30/21 0815  cefoTEtan (CEFOTAN) 2 g in sodium chloride 0.9 % 100 mL IVPB        2 g 200 mL/hr over 30 Minutes Intravenous On call to O.R. 05/30/21 0811 05/30/21 1030   05/30/21 0815  clindamycin (CLEOCIN) 900 mg, gentamicin (GARAMYCIN) 240 mg in sodium chloride 0.9 % 1,000 mL for intraperitoneal lavage  Status:  Discontinued         Irrigation To Surgery 05/30/21 0811 05/30/21 1513       Assessment/Plan: Problem List: Patient Active Problem List   Diagnosis Date Noted   Rheumatic fever without heart involvement 05/30/2021   Colorectal carcinoma ([place]) 03/08/2021   Genetic testing 01/28/2021   Abdominal fluid collection 12/31/2020   Metastatic colon cancer to liver ([place]) 12/13/2020   Family history of bladder cancer    Port-A-Cath in place 05/03/2020   Sepsis ([place]) 04/04/2020   SOB (shortness of breath) 04/04/2020   Moderate protein-calorie malnutrition ([place]) 04/04/2020   Nausea & vomiting 04/04/2020   TOA (tubo-ovarian abscess)    Intra-abdominal abscess ([place]) 03/31/2020   Lactose intolerance 03/12/2020   Microcytic anemia 03/12/2020   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  Hopeful home soon.   3 Days Post-Op    LOS: 3 days   Matt B. Hassell Done, MD, [facility] [phone] to reach the surgeon on call.    06/02/2021 9:44 AM

## 2021-06-03 NOTE — TOC Transition Note (Signed)
Transition of Care [facility]) - CM/SW Discharge Note   Patient Details  Name: Stephanie [name] MRN: [id number] Date of Birth: 10-24-1980  [facility] CM/SW Contact:  Pollie Friar, RN Phone Number: 06/03/2021, 11:13 AM   Clinical Narrative:    Patient is discharging home with self care. No needs per TOC.   Final next level of care: Home/Self Care Barriers to Discharge: No Barriers Identified   Patient Goals and CMS Choice        Discharge Placement                       Discharge Plan and Services                                     Social Determinants of Health (SDOH) Interventions     Readmission Risk Interventions Readmission Risk Prevention Plan 01/01/2021  Transportation Screening Complete  HRI or Home Care Consult Complete  Social Work Consult for [place] Planning/Counseling Complete  Palliative Care Screening Not Applicable  Medication Review [employer]) Complete  Some recent data might be hidden

## 2021-06-03 NOTE — Discharge Summary (Signed)
Physician Discharge Summary  Patient ID: Stephanie [name] MRN: [id number] DOB/AGE: [birth year] 41 y.o.  PCP: Pcp, No  Admit date: 05/30/2021 Discharge date: 06/03/2021  Admission Diagnoses:  ostomy in place  Discharge Diagnoses:  same post takedown of colostomy  Active Problems:   Rheumatic fever without heart involvement   Surgery:  takedown colostomy per Dr. Barry Dienes  Discharged Condition: improved  Hospital Course:   Patient had a lap assisted takedown of her colostomy and EEA staped anastomosis.  She began with flatus and a clear diet that was advanced to soft.  The ostomy site was healing and the wicks of Telfa were removed.  She was ready for discharge on Monday, Sept 5  Consults: none  Significant Diagnostic Studies: none but path which is pending    Discharge Exam: Blood pressure 100/60, pulse (!) 56, temperature 98 F (36.7 C), temperature source Oral, resp. rate 17, height '5\' 9"'$  (1.753 m), weight 72.4 kg, last menstrual period 04/29/2021, SpO2 98 %. Incisions are healing ok.  No abdominal pain  Disposition: Discharge disposition: 01-Home or Self Care       Discharge Instructions     Call MD for:  redness, tenderness, or signs of infection (pain, swelling, redness, odor or green/yellow discharge around incision site)   Complete by: As directed    Diet - low sodium heart healthy   Complete by: As directed    Increase activity slowly   Complete by: As directed       Allergies as of 06/03/2021       Reactions   Oxaliplatin Shortness Of Breath        Medication List     STOP taking these medications    clindamycin 2 % vaginal cream Commonly known as: CLEOCIN   etonogestrel-ethinyl estradiol 0.12-0.015 MG/24HR vaginal ring Commonly known as: NUVARING   metroNIDAZOLE 500 MG tablet Commonly known as: FLAGYL   sulfamethoxazole-trimethoprim 800-160 MG tablet Commonly known as: BACTRIM DS       TAKE these medications    acetaminophen 325 MG  tablet Commonly known as: TYLENOL Take 2 tablets (650 mg total) by mouth every 6 (six) hours as needed for mild pain, moderate pain, fever or headache (fever > 101).   clonazePAM 0.5 MG tablet Commonly known as: KlonoPIN Take 1 tablet (0.5 mg total) by mouth daily as needed for anxiety.   Investigational palmitoylethanolamide/placebo 400 MG capsule [id number] Take 1 capsule by mouth twice daily for 8 weeks. One capsule (400 mg) in the morning and one capsule (400 mg) in the evening, take with food and swallow whole. Do not crush or open capsule. Store at room temperature. Keep container tightly closed.   meloxicam 7.5 MG tablet Commonly known as: Mobic Take 1 tablet (7.5 mg total) by mouth daily. What changed:  when to take this reasons to take this   methocarbamol 500 MG tablet Commonly known as: Robaxin Take 1 tablet (500 mg total) by mouth every 8 (eight) hours as needed for muscle spasms.   oxyCODONE 5 MG immediate release tablet Commonly known as: Oxy IR/ROXICODONE Take 1-2 tablets (5-10 mg total) by mouth every 4 (four) hours as needed for moderate pain.   prenatal multivitamin Tabs tablet Take 1 tablet by mouth daily at 12 noon.   traMADol 50 MG tablet Commonly known as: ULTRAM Take 1 tablet (50 mg total) by mouth every 6 (six) hours as needed for severe pain.   VITAMIN C PO Take 1 tablet by mouth daily.  VITAMIN D3 PO Take 1 tablet by mouth daily.   zinc gluconate 50 MG tablet Take 50 mg by mouth daily.        Follow-up Information     [name], MD Follow up in 2 week(s).   Specialty: General Surgery Contact information: [address] [place] [address] [phone]                 Signed: Pedro Earls 06/03/2021, 9:15 AM

## 2021-06-03 NOTE — Progress Notes (Signed)
Discharge instructions given to patient, dressing change materials supplied to patient, Ivs removed, patient verbalizes understanding of discharged instructions. Patient discharged

## 2021-06-04 ENCOUNTER — Ambulatory Visit: Payer: Medicaid Other | Admitting: Physician Assistant

## 2021-06-04 LAB — SURGICAL PATHOLOGY

## 2021-06-27 ENCOUNTER — Other Ambulatory Visit: Payer: Self-pay

## 2021-06-27 ENCOUNTER — Emergency Department (HOSPITAL_COMMUNITY)
Admission: EM | Admit: 2021-06-27 | Discharge: 2021-06-27 | Disposition: A | Discharge status: Home / Self Care | Payer: Medicaid Other | Attending: Emergency Medicine | Admitting: Emergency Medicine

## 2021-06-27 ENCOUNTER — Encounter (HOSPITAL_COMMUNITY): Payer: Self-pay

## 2021-06-27 DIAGNOSIS — Z87891 Personal history of nicotine dependence: Secondary | ICD-10-CM | POA: Insufficient documentation

## 2021-06-27 DIAGNOSIS — K602 Anal fissure, unspecified: Secondary | ICD-10-CM | POA: Insufficient documentation

## 2021-06-27 DIAGNOSIS — Z85038 Personal history of other malignant neoplasm of large intestine: Secondary | ICD-10-CM | POA: Insufficient documentation

## 2021-06-27 DIAGNOSIS — R195 Other fecal abnormalities: Secondary | ICD-10-CM | POA: Diagnosis present

## 2021-06-27 NOTE — ED Provider Notes (Signed)
[facility] DEPT Provider Note   CSN: [id number] Arrival date & time: 06/27/21  7017     History Chief Complaint  Patient presents with   Blood In Stools    Stephanie [name] is a 40 y.o. female.  HPI Patient has noticed 3 bowel movements with blood today.  She has seen them both in the stool and when she wipes.  She has been stooling frequently, 5-7 times a day for several days.  She is starting to eat more following her recent colostomy reversal, 4 weeks ago.  She denies abdominal pain, fever, chills, nausea or vomiting.  There are no other known active modifying factors.    Past Medical History:  Diagnosis Date   Anxiety    Blood transfusion without reported diagnosis    BV (bacterial vaginosis)    Cancer ([place])    Colon Cancer 03-2020   Colostomy present [facility])    03-2020   Family history of bladder cancer    History of chemotherapy    ended 09-2020   Lactose intolerance 03/12/2020   Neuromuscular disorder (HCC)    neuropathy feet hands legs   UTI (lower urinary tract infection)     Patient Active Problem List   Diagnosis Date Noted   Rheumatic fever without heart involvement 05/30/2021   Colorectal carcinoma ([place]) 03/08/2021   Genetic testing 01/28/2021   Abdominal fluid collection 12/31/2020   Metastatic colon cancer to liver ([place]) 12/13/2020   Family history of bladder cancer    Port-A-Cath in place 05/03/2020   Sepsis ([place]) 04/04/2020   SOB (shortness of breath) 04/04/2020   Moderate protein-calorie malnutrition (HCC) 04/04/2020   Nausea & vomiting 04/04/2020   TOA (tubo-ovarian abscess)    Intra-abdominal abscess ([place]) 03/31/2020   Lactose intolerance 03/12/2020   Microcytic anemia 03/12/2020   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   Peritonitis with abscess of intestine ([place]) 03/12/2020   Recurrent urinary tract infection 06/01/2012   Contraception, device intrauterine 03/23/2012   Genital herpes  simplex 02/09/2012   Attention deficit disorder 02/27/2010   Anxiety state 02/27/2010    Past Surgical History:  Procedure Laterality Date   CESAREAN SECTION     CHOLECYSTECTOMY  12/13/2020   COLECTOMY  03/2020   COLONOSCOPY     COLOSTOMY TAKEDOWN N/A 05/30/2021   Procedure: LAPAROSCOPIC ASSISTED COLOSTOMY TAKEDOWN;  Surgeon: [name], MD;  Location: [place];  Service: General;  Laterality: N/A;   CYSTOSCOPY WITH STENT PLACEMENT  04/04/2020   Procedure: CYSTOSCOPY WITH STENT PLACEMENT;  Surgeon: [name], MD;  [facility];  Service: General;;   LAPAROSCOPIC LIVER ULTRASOUND N/A 12/13/2020   Procedure: INTRAOPERATIVE LIVER ULTRASOUND;  Surgeon: [name], MD;  Location: [place];  Service: General;  Laterality: N/A;   LAPAROSCOPY N/A 12/13/2020   Procedure: LAPAROSCOPY DIAGNOSTIC;  Surgeon: [name], MD;  Location: [place] [facility];  Service: General;  Laterality: N/A;   LAPAROTOMY N/A 04/04/2020   Procedure: EXPLORATORY LAPAROTOMY;  Surgeon: [name], MD;  [facility];  Service: General;  Laterality: N/A;   OPEN PARTIAL HEPATECTOMY  N/A 12/13/2020   Procedure: OPEN PARTIAL HEPATECTOMY;  Surgeon: [name], MD;  Location: [place];  Service: General;  Laterality: N/A;  ROOM 2 STARTING AT 09:30AM FOR 300 MIN   PORTACATH PLACEMENT Right 04/12/2020   Procedure: INSERTION PORT-A-CATH WITH ULTRASOUND;  Surgeon: Kieth Brightly Arta Bruce, MD;  [facility];  Service: General;  Laterality: Right;     OB History  Gravida  3   Para  1   Term  1   Preterm      AB  1   Living  1      SAB      IAB  1   Ectopic      Multiple      Live Births              Family History  Problem Relation Age of Onset   Heart disease Father    Stroke Father    Aneurysm Father    Bladder Cancer Maternal Grandmother        dx 90s   Colon cancer Neg Hx    Esophageal cancer Neg Hx    Colon polyps Neg Hx    Stomach cancer Neg Hx    Rectal cancer Neg Hx     Social  History   Tobacco Use   Smoking status: Former    Packs/day: 0.50    Years: 10.00    Pack years: 5.00    Types: Cigarettes   Smokeless tobacco: Never  Vaping Use   Vaping Use: Never used  Substance Use Topics   Alcohol use: Not Currently    Comment: seldom   Drug use: No    Home Medications Prior to Admission medications   Medication Sig Start Date End Date Taking? Authorizing Provider  acetaminophen (TYLENOL) 325 MG tablet Take 2 tablets (650 mg total) by mouth every 6 (six) hours as needed for mild pain, moderate pain, fever or headache (fever > 101). 05/31/21   [name], MD  Ascorbic Acid (VITAMIN C PO) Take 1 tablet by mouth daily.    [provider]  Cholecalciferol (VITAMIN D3 PO) Take 1 tablet by mouth daily.    [provider]  clonazePAM (KLONOPIN) 0.5 MG tablet Take 1 tablet (0.5 mg total) by mouth daily as needed for anxiety. 05/27/21   [name], MD  Investigational palmitoylethanolamide/placebo 400 MG capsule [facility] Take 1 capsule by mouth twice daily for 8 weeks. One capsule (400 mg) in the morning and one capsule (400 mg) in the evening, take with food and swallow whole. Do not crush or open capsule. Store at room temperature. Keep container tightly closed. 03/14/21   [name], MD  meloxicam (MOBIC) 7.5 MG tablet Take 1 tablet (7.5 mg total) by mouth daily. Patient taking differently: Take 7.5 mg by mouth daily as needed for pain. 04/28/21   Montine Circle, PA-C  methocarbamol (ROBAXIN) 500 MG tablet Take 1 tablet (500 mg total) by mouth every 8 (eight) hours as needed for muscle spasms. 05/31/21   [name], MD  oxyCODONE (OXY IR/ROXICODONE) 5 MG immediate release tablet Take 1-2 tablets (5-10 mg total) by mouth every 4 (four) hours as needed for moderate pain. 05/31/21   [name], MD  Prenatal Vit-Fe Fumarate-FA (PRENATAL MULTIVITAMIN) TABS tablet Take 1 tablet by mouth daily at 12 noon.    [provider]  traMADol (ULTRAM) 50  MG tablet Take 1 tablet (50 mg total) by mouth every 6 (six) hours as needed for severe pain. 05/06/21   Woodroe Mode, MD  zinc gluconate 50 MG tablet Take 50 mg by mouth daily.    [provider]    Allergies    Oxaliplatin  Review of Systems   Review of Systems  All other systems reviewed and are negative.  Physical Exam Updated Vital Signs BP 115/71 (BP Location: Left Arm)   Pulse 80  Temp 98.1 F (36.7 C) (Oral)   Resp 16   Ht 5\' 9"  (1.753 m)   Wt 68 kg   LMP 06/27/2021   SpO2 98%   BMI 22.15 kg/m   Physical Exam Vitals and nursing note reviewed.  Constitutional:      General: She is not in acute distress.    Appearance: She is well-developed. She is not ill-appearing or diaphoretic.  HENT:     Head: Normocephalic and atraumatic.     Right Ear: External ear normal.     Left Ear: External ear normal.  Eyes:     Conjunctiva/sclera: Conjunctivae normal.     Pupils: Pupils are equal, round, and reactive to light.  Neck:     Trachea: Phonation normal.  Cardiovascular:     Rate and Rhythm: Normal rate.  Pulmonary:     Effort: Pulmonary effort is normal.  Abdominal:     General: There is no distension.     Palpations: Abdomen is soft.     Tenderness: There is no abdominal tenderness.  Genitourinary:    Comments: Anal fissure present, mild bleeding noted.  Left posterior aspect.  Digital rectal examination otherwise normal with brown stool.  No palpable internal hemorrhoid, or external hemorrhoids. Musculoskeletal:        General: Normal range of motion.     Cervical back: Normal range of motion and neck supple.  Skin:    General: Skin is warm and dry.  Neurological:     Mental Status: She is alert and oriented to person, place, and time.     Cranial Nerves: No cranial nerve deficit.     Sensory: No sensory deficit.     Motor: No abnormal muscle tone.     Coordination: Coordination normal.  Psychiatric:        Mood and Affect: Mood normal.         Behavior: Behavior normal.        Thought Content: Thought content normal.        Judgment: Judgment normal.    ED Results / Procedures / Treatments   Labs (all labs ordered are listed, but only abnormal results are displayed) Labs Reviewed  COMPREHENSIVE METABOLIC PANEL  CBC  POC OCCULT BLOOD, ED  I-STAT BETA HCG BLOOD, ED ([facility], [facility], [facility] ONLY)  TYPE AND SCREEN    EKG None  Radiology No results found.  Procedures Procedures   Medications Ordered in ED Medications - No data to display  ED Course  I have reviewed the triage vital signs and the nursing notes.  Pertinent labs & imaging results that were available during my care of the patient were reviewed by me and considered in my medical decision making (see chart for details).    MDM Rules/Calculators/A&P                            Patient Vitals for the past 24 hrs:  BP Temp Temp src Pulse Resp SpO2 Height Weight  06/27/21 0951 -- -- -- -- -- -- 5\' 9"  (1.753 m) 68 kg  06/27/21 0947 115/71 98.1 F (36.7 C) Oral 80 16 98 % -- --    11:51 AM Reevaluation with update and discussion. After initial assessment and treatment, an updated evaluation reveals she is comfortable has no further complaints.  Findings discussed and questions answered. Daleen Bo   Medical Decision Making:  This patient is presenting for evaluation of rectal bleeding post colostomy  reversal, which does require a range of treatment options, and is a complaint that involves a moderate risk of morbidity and mortality. The differential diagnoses include hemorrhoid, anal fissure, complications from surgery. I decided to review old records, and in summary patient with colon cancer, treated, recently had colostomy reversal, and is also troubled by ongoing problems in her home setting.  I did not require additional historical information from anyone.    Critical Interventions-clinical evaluation, discussion with patient  After These Interventions,  the Patient was reevaluated and was found with source of rectal bleeding, anal fissure.  Hemodynamically stable.  Labs and imaging not required.  She is stable for discharge.  CRITICAL CARE-no Performed by: Daleen Bo  Nursing Notes Reviewed/ Care Coordinated Applicable Imaging Reviewed Interpretation of Laboratory Data incorporated into ED treatment  The patient appears reasonably screened and/or stabilized for discharge and I doubt any other medical condition or other [facility] requiring further screening, evaluation, or treatment in the ED at this time prior to discharge.  Plan: Home Medications-continue usual; Home Treatments-symptomatic treatment; return here if the recommended treatment, does not improve the symptoms; Recommended follow up-General surgery as needed     Final Clinical Impression(s) / ED Diagnoses Final diagnoses:  Anal fissure    Rx / DC Orders ED Discharge Orders     None        Daleen Bo, MD 06/27/21 1153

## 2021-06-27 NOTE — Discharge Instructions (Addendum)
You have an anal fissure which is causing your bleeding.  This is likely related to the frequent stooling.  To help the sore heal, soak in a warm tub between 1 and 3 times a day then clean the area well with soap and water.  It will also help to use moist towelettes for cleansing after bowel movements.  Try to avoid being constipated which can worsen anal fissures.  I typically heal within a few days.  Watch for symptoms including increased pain, heavy bleeding or blood clots.  These would be worrisome and should be checked out.  Follow-up with your surgeon for problems or concerns.

## 2021-06-27 NOTE — ED Triage Notes (Addendum)
Patient reports that she had 2 episodes of dark red blood in her stool with some pink. Patient reports that she began having bloating and tenderness of the abdomen yesterday. Patient also c/o nausea.

## 2021-06-28 ENCOUNTER — Telehealth: Payer: Self-pay | Admitting: Emergency Medicine

## 2021-06-28 NOTE — Telephone Encounter (Signed)
[facility]102 - TREATMENT OF ESTABLISHED CHEMOTHERAPY-INDUCED NEUROPATHY WITH N-PALMITOYLETHANOLAMIDE, A CANNABIMIMETIC NUTRACEUTICAL: A RANDOMIZED DOUBLE-BLIND PHASE II PILOT TRIAL  Patient was seen in ED for anal fissure on 06/27/2021.  Pt is in f/u period of study and has not been on study drug for >30 days.  MD Burr Medico has confirmed that anal fissure is unrelated and less than Grade 3 per CTCAE v 5.0.  Not reportable and no action necessary.  [name] 'Learta CoddingNeysa Bonito, RN, BSN Clinical Research Nurse I 06/28/21 2:38 PM

## 2021-07-02 ENCOUNTER — Encounter: Payer: Self-pay | Admitting: Physician Assistant

## 2021-07-02 ENCOUNTER — Ambulatory Visit (INDEPENDENT_AMBULATORY_CARE_PROVIDER_SITE_OTHER): Payer: Medicaid Other | Admitting: Physician Assistant

## 2021-07-02 VITALS — BP 96/70 | HR 76 | Ht 68.0 in | Wt 146.0 lb

## 2021-07-02 DIAGNOSIS — Z85038 Personal history of other malignant neoplasm of large intestine: Secondary | ICD-10-CM | POA: Diagnosis not present

## 2021-07-02 DIAGNOSIS — K921 Melena: Secondary | ICD-10-CM

## 2021-07-02 DIAGNOSIS — K439 Ventral hernia without obstruction or gangrene: Secondary | ICD-10-CM | POA: Diagnosis not present

## 2021-07-02 NOTE — Progress Notes (Signed)
Chief Complaint: Change in bowel habits, blood in her stool  HPI:    Stephanie [name] is a 40 year old female, assigned to Dr. Havery Moros, with a past medical history of adenocarcinoma of the rectosigmoid colon, status post hemicolectomy, chemo and radiation, bowel obstruction, colon cancer and others listed below, who presents to clinic today for complaint of change in bowel habits and blood in her stool.    02/25/2017 patient seen in clinic by Nicoletta Ba and assigned to Dr. Havery Moros, at that time discussed that she had recent diarrhea and intermittent rectal bleeding with abnormal CT scan concerning for parasitic infection.  At that time described history of having a puppy with worm infection.  She was treated with 2 courses of Albendazole and seemed to be feeling a little bit better.  CT scan 01/25/2017 had showed concern for proctitis.  Recommend she have a colonoscopy.    04/04/2020 patient had worsening abdominal pain and abdominal abscess status post urgent open sigmoid colectomy and end colostomy on 04/04/2020.  She was found to have perforation and positive radial margin.  Liver biopsy 04/16/2020 confirmed metastatic disease from her colon cancer.  She underwent chemo.  Also underwent liver resection 12/13/2020.  She continues to follow with oncology.    03/18/2021 colonoscopy with 1 4 to 5 mm polyp in the transverse colon and otherwise normal, normal rectal pouch, okay to proceed with surgery for colostomy takedown.  Pathology showed sessile serrated polyp.  Recommend she have repeat in 3 years.    05/05/2021 CT of the abdomen pelvis for rectal bleeding.  This showed postsurgical changes which were stable and mild pelvic varices.    06/27/2021 ER visit for bloody bowel movements.  Described a colostomy reversal 4 weeks prior.  Noted to have an anal fissure with mild bleeding.    06/28/2021 patient called general surgery to ask about her diarrhea with 5-7 stools a day.  Recommend she start Metamucil and  Imodium.    Today, the patient tells me that since starting the Metamucil once a day her bowel movements have decreased to about 4-5 a day and then are still soft solid, no diarrhea, no abdominal pain, no urgency.  Tells me that when she had her colostomy she had about this many bowel movements a day anyways.  She feels like this is her normal.    Also discusses the blood in her stool.  Tells me she was diagnosed with a fissure and is actually not seen any further bleeding over the past week and a half.    Does ask some questions in regards to a "lump" in her abdomen.  Also wants to make sure that she has been checked for Crohn's.    Denies fever, chills, weight loss or symptoms that awaken her from sleep.  Past Medical History:  Diagnosis Date   Anxiety    Blood transfusion without reported diagnosis    Bowel obstruction (HCC)    BV (bacterial vaginosis)    Colon cancer [facility])    Colon Cancer 03-2020   Colon polyps    Colostomy present [facility])    03-2020   Family history of bladder cancer    History of chemotherapy    ended 09-2020   Lactose intolerance 03/12/2020   Neuromuscular disorder (HCC)    neuropathy feet hands legs   UTI (lower urinary tract infection)     Past Surgical History:  Procedure Laterality Date   CESAREAN SECTION     CHOLECYSTECTOMY  12/13/2020  COLECTOMY  03/2020   COLONOSCOPY     COLOSTOMY TAKEDOWN N/A 05/30/2021   Procedure: LAPAROSCOPIC ASSISTED COLOSTOMY TAKEDOWN;  Surgeon: [name], MD;  Location: [place];  Service: General;  Laterality: N/A;   CYSTOSCOPY WITH STENT PLACEMENT  04/04/2020   Procedure: CYSTOSCOPY WITH STENT PLACEMENT;  Surgeon: [name], MD;  [facility];  Service: General;;   LAPAROSCOPIC LIVER ULTRASOUND N/A 12/13/2020   Procedure: INTRAOPERATIVE LIVER ULTRASOUND;  Surgeon: [name], MD;  Location: [place];  Service: General;  Laterality: N/A;   LAPAROSCOPY N/A 12/13/2020   Procedure: LAPAROSCOPY DIAGNOSTIC;  Surgeon: [name], MD;  Location: [place];  Service: General;  Laterality: N/A;   LAPAROTOMY N/A 04/04/2020   Procedure: EXPLORATORY LAPAROTOMY;  Surgeon: [name], MD;  [facility];  Service: General;  Laterality: N/A;   OPEN PARTIAL HEPATECTOMY  N/A 12/13/2020   Procedure: OPEN PARTIAL HEPATECTOMY;  Surgeon: [name], MD;  Location: [place];  Service: General;  Laterality: N/A;  ROOM 2 STARTING AT 09:30AM FOR 300 MIN   PORTACATH PLACEMENT Right 04/12/2020   Procedure: INSERTION PORT-A-CATH WITH ULTRASOUND;  Surgeon: Mickeal Skinner, MD;  [facility];  Service: General;  Laterality: Right;    Current Outpatient Medications  Medication Sig Dispense Refill   acetaminophen (TYLENOL) 325 MG tablet Take 2 tablets (650 mg total) by mouth every 6 (six) hours as needed for mild pain, moderate pain, fever or headache (fever > 101).     Ascorbic Acid (VITAMIN C PO) Take 1 tablet by mouth daily.     Cholecalciferol (VITAMIN D3 PO) Take 1 tablet by mouth daily.     meloxicam (MOBIC) 7.5 MG tablet Take 1 tablet (7.5 mg total) by mouth daily. (Patient taking differently: Take 7.5 mg by mouth daily as needed for pain.) 20 tablet 0   Metamucil Fiber CHEW Chew 3 tablets by mouth 3 (three) times daily.     methocarbamol (ROBAXIN) 500 MG tablet Take 1 tablet (500 mg total) by mouth every 8 (eight) hours as needed for muscle spasms. 20 tablet 1   Prenatal Vit-Fe Fumarate-FA (PRENATAL MULTIVITAMIN) TABS tablet Take 1 tablet by mouth daily at 12 noon.     zinc gluconate 50 MG tablet Take 50 mg by mouth daily.     clonazePAM (KLONOPIN) 0.5 MG tablet Take 1 tablet (0.5 mg total) by mouth daily as needed for anxiety. (Patient not taking: Reported on 07/02/2021) 15 tablet 0   No current facility-administered medications for this visit.    Allergies as of 07/02/2021 - Review Complete 07/02/2021  Allergen Reaction Noted   Oxaliplatin Shortness Of Breath 10/10/2020    Family History  Problem Relation Age  of Onset   Irritable bowel syndrome Mother    Heart disease Father    Stroke Father    Aneurysm Father    Irritable bowel syndrome Brother    Other Brother        gluten intolerance   Bladder Cancer Maternal Grandmother        dx 90s   Colon cancer Neg Hx    Esophageal cancer Neg Hx    Colon polyps Neg Hx    Stomach cancer Neg Hx    Rectal cancer Neg Hx     Social History   Socioeconomic History   Marital status: Single    Spouse name: Not on file   Number of children: 1   Years of education: Not on file   Highest education level: Not  on file  Occupational History   Occupation: hair stylist  Tobacco Use   Smoking status: Former    Packs/day: 0.50    Years: 10.00    Pack years: 5.00    Types: Cigarettes    Quit date: 2018    Years since quitting: 4.7   Smokeless tobacco: Never  Vaping Use   Vaping Use: Never used  Substance and Sexual Activity   Alcohol use: Not Currently    Comment: seldom   Drug use: No   Sexual activity: Not Currently    Birth control/protection: None, Other-see comments    Comment: nuva ring  Other Topics Concern   Not on file  Social History Narrative   Not on file   Social Determinants of Health   Financial Resource Strain: Not on file  Food Insecurity: Not on file  Transportation Needs: Not on file  Physical Activity: Not on file  Stress: Not on file  Social Connections: Not on file  Intimate Partner Violence: Not on file    Review of Systems:    Constitutional: No weight loss, fever or chills Cardiovascular: No chest pain Respiratory: No SOB  Gastrointestinal: See HPI and otherwise negative   Physical Exam:  Vital signs: BP 96/70 (BP Location: Left Arm, Patient Position: Sitting, Cuff Size: Normal)   Pulse 76   Ht 5\' 8"  (1.727 m) Comment: height measured without shoes  Wt 146 lb (66.2 kg)   LMP 06/27/2021   BMI 22.20 kg/m   Constitutional:   Pleasant Caucasian female appears to be in NAD, Well developed, Well  nourished, alert and cooperative Respiratory: Respirations even and unlabored. Lungs clear to auscultation bilaterally.   No wheezes, crackles, or rhonchi.  Cardiovascular: Normal S1, S2. No MRG. Regular rate and rhythm. No peripheral edema, cyanosis or pallor.  Gastrointestinal:  Soft, nondistended, nontender. No rebound or guarding. Normal bowel sounds. No appreciable masses or hepatomegaly.+ Midline surgical scar with reducible nontender hernia about the size of an egg just to the right of her umbilicus Rectal:  Not performed.  Psychiatric: Demonstrates good judgement and reason without abnormal affect or behaviors.  RELEVANT LABS AND IMAGING: CBC    Component Value Date/Time   WBC 4.0 06/02/2021 0223   RBC 4.31 06/02/2021 0223   HGB 13.2 06/02/2021 0223   HGB 13.1 05/27/2021 0801   HCT 39.5 06/02/2021 0223   PLT 98 (L) 06/02/2021 0223   PLT 97 (L) 05/27/2021 0801   MCV 91.6 06/02/2021 0223   MCH 30.6 06/02/2021 0223   MCHC 33.4 06/02/2021 0223   RDW 13.6 06/02/2021 0223   LYMPHSABS 0.8 05/27/2021 0801   MONOABS 0.4 05/27/2021 0801   EOSABS 0.1 05/27/2021 0801   BASOSABS 0.0 05/27/2021 0801    CMP     Component Value Date/Time   NA 138 06/02/2021 0223   K 3.9 06/02/2021 0223   CL 102 06/02/2021 0223   CO2 28 06/02/2021 0223   GLUCOSE 91 06/02/2021 0223   BUN 8 06/02/2021 0223   CREATININE 0.59 06/02/2021 0223   CREATININE 0.68 05/27/2021 0801   CALCIUM 9.0 06/02/2021 0223   PROT 6.2 (L) 05/27/2021 0801   ALBUMIN 3.5 05/27/2021 0801   AST 21 05/27/2021 0801   ALT 28 05/27/2021 0801   ALKPHOS 70 05/27/2021 0801   BILITOT 0.5 05/27/2021 0801   GFRNONAA >60 06/02/2021 0223   GFRNONAA >60 05/27/2021 0801   GFRAA >60 06/20/2020 0902    Assessment: 1.  Abdominal wall hernia: Easily reducible, nontender  hernia near surgical incision scar 2.  History of metastatic colon cancer: Metastatic to her liver status post partial colectomy and Hartman's pouch as well as liver  resection and chemotherapy continues to follow with oncology 3.  Hematochezia: Seen in the ER and diagnosed with fissure which is likely the cause  Plan: 1.  Discussed with the patient that she recently had a colonoscopy that did not show any signs of Crohn's disease.  She does not need any further work-up for this. 2.  Discussed that her next colonoscopy will be due in 3 years.  Ensured that she was in recall for this. 3.  Discussed 4-5 bowel movements a day.  These are soft solid, she has no abdominal pain, these are not urgent, I would recommend she continue the fiber and maybe try to increase and see if this continues to help.  Otherwise she should not worry about it. 4.  Discussed blood in her stool, she has not seen any further of this and likely it was related to fissure. 5.  Discussed with patient that there is no need to repair her hernia as long as she has no pain in this area and it is reducible. 5.  Patient to follow in clinic with [place] as needed.  Ellouise Newer, PA-C [facility] 07/02/2021, 2:58 PM

## 2021-07-02 NOTE — Patient Instructions (Signed)
Follow up as needed.  If you are age 41 or older, your body mass index should be between 23-30. Your Body mass index is 22.2 kg/m. If this is out of the aforementioned range listed, please consider follow up with your Primary Care Provider.  If you are age 75 or younger, your body mass index should be between 19-25. Your Body mass index is 22.2 kg/m. If this is out of the aformentioned range listed, please consider follow up with your Primary Care Provider.   __________________________________________________________  The [place] [facility] providers would like to encourage you to use [facility] to communicate with providers for non-urgent requests or questions.  Due to long hold times on the telephone, sending your provider a message by [facility] may be a faster and more efficient way to get a response.  Please allow 48 business hours for a response.  Please remember that this is for non-urgent requests.

## 2021-07-03 NOTE — Progress Notes (Signed)
Agree with assessment and plan as outlined. Hopefully bleeding just due to fissure noted previously and that has resolved. If she has recurrence of bleeding, she needs to contact us and would have a low threshold to do a flex sig to ensure okay given did not see rectal pouch too well during her colonoscopy, would make sure anastomosis is okay and clear rectum / rectosigmoid colon.

## 2021-07-10 ENCOUNTER — Encounter: Payer: Self-pay | Admitting: Obstetrics & Gynecology

## 2021-07-10 ENCOUNTER — Ambulatory Visit (INDEPENDENT_AMBULATORY_CARE_PROVIDER_SITE_OTHER): Payer: Medicaid Other | Admitting: Obstetrics & Gynecology

## 2021-07-10 ENCOUNTER — Other Ambulatory Visit: Payer: Self-pay

## 2021-07-10 VITALS — BP 112/73 | HR 85 | Ht 68.0 in | Wt 147.0 lb

## 2021-07-10 DIAGNOSIS — T192XXA Foreign body in vulva and vagina, initial encounter: Secondary | ICD-10-CM | POA: Diagnosis not present

## 2021-07-10 DIAGNOSIS — W448XXA Other foreign body entering into or through a natural orifice, initial encounter: Secondary | ICD-10-CM

## 2021-07-10 DIAGNOSIS — N938 Other specified abnormal uterine and vaginal bleeding: Secondary | ICD-10-CM | POA: Diagnosis not present

## 2021-07-10 NOTE — Progress Notes (Signed)
Patient ID: ETOLA MULL, female   DOB: 1981/09/25, 40 y.o.   MRN: [id number]  Chief Complaint  Patient presents with   Menstrual Problem    HPI BRISTOL OSENTOSKI is a 40 y.o. female.  [id number] Patient has daily spotting since IUD removed and hormones d/c. She used tampons, Patient's last menstrual period was 06/27/2021.  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[facility])    03-2020   Family history of bladder cancer    History of chemotherapy    ended 09-2020   Lactose intolerance 03/12/2020   Neuromuscular disorder ([facility])    neuropathy feet hands legs   UTI (lower urinary tract infection)     Past Surgical History:  Procedure Laterality Date   CESAREAN SECTION     CHOLECYSTECTOMY  12/13/2020   COLECTOMY  03/2020   COLONOSCOPY     COLOSTOMY TAKEDOWN N/A 05/30/2021   Procedure: LAPAROSCOPIC ASSISTED COLOSTOMY TAKEDOWN;  Surgeon: [name], MD;  Location: [place];  Service: General;  Laterality: N/A;   CYSTOSCOPY WITH STENT PLACEMENT  04/04/2020   Procedure: CYSTOSCOPY WITH STENT PLACEMENT;  Surgeon: [name], MD;  Location: [facility];  Service: General;;   LAPAROSCOPIC LIVER ULTRASOUND N/A 12/13/2020   Procedure: INTRAOPERATIVE LIVER ULTRASOUND;  Surgeon: [name], MD;  Location: [place];  Service: General;  Laterality: N/A;   LAPAROSCOPY N/A 12/13/2020   Procedure: LAPAROSCOPY DIAGNOSTIC;  Surgeon: [name], MD;  Location: [place];  Service: General;  Laterality: N/A;   LAPAROTOMY N/A 04/04/2020   Procedure: EXPLORATORY LAPAROTOMY;  Surgeon: [name], MD;  Location: [facility];  Service: General;  Laterality: N/A;   OPEN PARTIAL HEPATECTOMY  N/A 12/13/2020   Procedure: OPEN PARTIAL HEPATECTOMY;  Surgeon: [name], MD;  Location: [place];  Service: General;   Laterality: N/A;  ROOM 2 STARTING AT 09:30AM FOR 300 MIN   PORTACATH PLACEMENT Right 04/12/2020   Procedure: INSERTION PORT-A-CATH WITH ULTRASOUND;  Surgeon: Kinsinger, Arta Bruce, MD;  Location: [facility];  Service: General;  Laterality: Right;    Family History  Problem Relation Age of Onset   Irritable bowel syndrome Mother    Heart disease Father    Stroke Father    Aneurysm Father    Irritable bowel syndrome Brother    Other Brother        gluten intolerance   Bladder Cancer Maternal Grandmother        dx 90s   Colon cancer Neg Hx    Esophageal cancer Neg Hx    Colon polyps Neg Hx    Stomach cancer Neg Hx    Rectal cancer Neg Hx     Social History Social History   Tobacco Use   Smoking status: Former    Packs/day: 0.50    Years: 10.00    Pack years: 5.00    Types: Cigarettes    Quit date: 2018    Years since quitting: 4.7   Smokeless tobacco: Never  Vaping Use   Vaping Use: Never used  Substance Use Topics   Alcohol use: Not Currently    Comment: seldom   Drug use: No    Allergies  Allergen Reactions   Oxaliplatin Shortness Of Breath    Current Outpatient Medications  Medication Sig Dispense Refill   acetaminophen (TYLENOL) 325 MG tablet Take 2 tablets (650 mg total) by mouth every 6 (six) hours as needed for mild pain, moderate pain, fever or headache (fever > 101).     Ascorbic Acid (VITAMIN C PO) Take 1 tablet by mouth daily.     Cholecalciferol (VITAMIN D3 PO) Take 1 tablet by mouth daily.     meloxicam (MOBIC) 7.5 MG tablet Take 1 tablet (7.5 mg total) by mouth daily. (Patient taking differently: Take 7.5 mg by mouth daily as needed for pain.) 20 tablet 0   Metamucil Fiber CHEW Chew 3 tablets by mouth 3 (three) times daily.     Prenatal Vit-Fe Fumarate-FA (PRENATAL MULTIVITAMIN) TABS tablet Take 1 tablet by mouth daily at 12 noon.     zinc gluconate 50 MG tablet Take 50 mg by mouth daily.     clonazePAM (KLONOPIN) 0.5 MG tablet Take 1 tablet (0.5 mg  total) by mouth daily as needed for anxiety. (Patient not taking: Reported on 07/10/2021) 15 tablet 0   methocarbamol (ROBAXIN) 500 MG tablet Take 1 tablet (500 mg total) by mouth every 8 (eight) hours as needed for muscle spasms. (Patient not taking: Reported on 07/10/2021) 20 tablet 1   No current facility-administered medications for this visit.    Review of Systems Review of Systems  Blood pressure 112/73, pulse 85, height 5\' 8"  (1.727 m), weight 147 lb (66.7 kg), last menstrual period 06/27/2021.  Physical Exam Physical Exam Vitals and nursing note reviewed. Exam conducted with a chaperone present.  Constitutional:      Appearance: Normal appearance.  Cardiovascular:     Rate and Rhythm: Normal rate.  Pulmonary:     Effort: Pulmonary effort is normal.  Genitourinary:    General: Normal vulva.     Exam position: Lithotomy position.     Vagina: Foreign body (old tampon removed) present.     Cervix: Normal.  Skin:    General: Skin is warm and dry.  Neurological:     General: No focal deficit present.     Mental Status: She is alert.    Data Reviewed Pap 2022 [place] and CT results Assessment Retained tampon  Plan Follow for continued DUB    [name] 07/10/2021, 11:00 AM

## 2021-07-10 NOTE — Progress Notes (Signed)
Pt is here to discuss issues with continuous spotting for 6 months. States she has to wear at least a pad or tampon daily. LMP: 06/27/21. BCM: none. Last imaging on abd/pelvis was CT on 05/05/21.

## 2021-07-11 ENCOUNTER — Emergency Department (HOSPITAL_COMMUNITY): Payer: Medicaid Other

## 2021-07-11 ENCOUNTER — Encounter (HOSPITAL_COMMUNITY): Payer: Self-pay | Admitting: Emergency Medicine

## 2021-07-11 ENCOUNTER — Emergency Department (HOSPITAL_COMMUNITY)
Admission: EM | Admit: 2021-07-11 | Discharge: 2021-07-11 | Disposition: A | Discharge status: Home / Self Care | Payer: Medicaid Other | Attending: Emergency Medicine | Admitting: Emergency Medicine

## 2021-07-11 DIAGNOSIS — Z87891 Personal history of nicotine dependence: Secondary | ICD-10-CM | POA: Diagnosis not present

## 2021-07-11 DIAGNOSIS — Z85048 Personal history of other malignant neoplasm of rectum, rectosigmoid junction, and anus: Secondary | ICD-10-CM | POA: Diagnosis not present

## 2021-07-11 DIAGNOSIS — Z8505 Personal history of malignant neoplasm of liver: Secondary | ICD-10-CM | POA: Insufficient documentation

## 2021-07-11 DIAGNOSIS — Z85038 Personal history of other malignant neoplasm of large intestine: Secondary | ICD-10-CM | POA: Diagnosis not present

## 2021-07-11 DIAGNOSIS — Z20822 Contact with and (suspected) exposure to covid-19: Secondary | ICD-10-CM | POA: Diagnosis not present

## 2021-07-11 DIAGNOSIS — N3000 Acute cystitis without hematuria: Secondary | ICD-10-CM

## 2021-07-11 DIAGNOSIS — R1011 Right upper quadrant pain: Secondary | ICD-10-CM | POA: Diagnosis not present

## 2021-07-11 DIAGNOSIS — R0602 Shortness of breath: Secondary | ICD-10-CM | POA: Diagnosis not present

## 2021-07-11 LAB — TROPONIN I (HIGH SENSITIVITY)
Troponin I (High Sensitivity): <2 ng/L (ref <18)
Troponin I (High Sensitivity): <2 ng/L (ref <18)

## 2021-07-11 LAB — CBC WITH DIFFERENTIAL/PLATELET
Abs Immature Granulocytes: 0.01 10*3/uL (ref 0.00–0.07)
Basophils Absolute: 0 10*3/uL (ref 0.0–0.1)
Basophils Relative: 0 %
Eosinophils Absolute: 0.1 10*3/uL (ref 0.0–0.5)
Eosinophils Relative: 2 %
HCT: 44.4 % (ref 36.0–46.0)
Hemoglobin: 14.8 g/dL (ref 12.0–15.0)
Immature Granulocytes: 0 %
Lymphocytes Relative: 21 %
Lymphs Abs: 1 10*3/uL (ref 0.7–4.0)
MCH: 31.4 pg (ref 26.0–34.0)
MCHC: 33.3 g/dL (ref 30.0–36.0)
MCV: 94.3 fL (ref 80.0–100.0)
Monocytes Absolute: 0.4 10*3/uL (ref 0.1–1.0)
Monocytes Relative: 9 %
Neutro Abs: 3.4 10*3/uL (ref 1.7–7.7)
Neutrophils Relative %: 68 %
Platelets: 110 10*3/uL — ABNORMAL LOW (ref 150–400)
RBC: 4.71 MIL/uL (ref 3.87–5.11)
RDW: 13.3 % (ref 11.5–15.5)
WBC: 5 10*3/uL (ref 4.0–10.5)
nRBC: 0 % (ref 0.0–0.2)

## 2021-07-11 LAB — COMPREHENSIVE METABOLIC PANEL
ALT: 23 U/L (ref 0–44)
AST: 19 U/L (ref 15–41)
Albumin: 4.2 g/dL (ref 3.5–5.0)
Alkaline Phosphatase: 69 U/L (ref 38–126)
Anion gap: 8 (ref 5–15)
BUN: 15 mg/dL (ref 6–20)
CO2: 27 mmol/L (ref 22–32)
Calcium: 9.2 mg/dL (ref 8.9–10.3)
Chloride: 100 mmol/L (ref 98–111)
Creatinine, Ser: 0.46 mg/dL (ref 0.44–1.00)
GFR, Estimated: >60 mL/min (ref >60)
Glucose, Bld: 92 mg/dL (ref 70–99)
Potassium: 4 mmol/L (ref 3.5–5.1)
Sodium: 135 mmol/L (ref 135–145)
Total Bilirubin: 0.7 mg/dL (ref 0.3–1.2)
Total Protein: 6.8 g/dL (ref 6.5–8.1)

## 2021-07-11 LAB — URINALYSIS, ROUTINE W REFLEX MICROSCOPIC
Bilirubin Urine: NEGATIVE
Glucose, UA: NEGATIVE mg/dL
Hgb urine dipstick: NEGATIVE
Ketones, ur: NEGATIVE mg/dL
Nitrite: NEGATIVE
Protein, ur: NEGATIVE mg/dL
Specific Gravity, Urine: 1.009 (ref 1.005–1.030)
pH: 7 (ref 5.0–8.0)

## 2021-07-11 LAB — RESP PANEL BY RT-PCR (FLU A&B, COVID) ARPGX2
Influenza A by PCR: NEGATIVE
Influenza B by PCR: NEGATIVE
SARS Coronavirus 2 by RT PCR: NEGATIVE

## 2021-07-11 LAB — PROTIME-INR
INR: 1 (ref 0.8–1.2)
Prothrombin Time: 13.1 seconds (ref 11.4–15.2)

## 2021-07-11 LAB — FOLLICLE STIMULATING HORMONE: FSH: 11.9 m[IU]/mL

## 2021-07-11 LAB — BRAIN NATRIURETIC PEPTIDE: B Natriuretic Peptide: 8.2 pg/mL (ref 0.0–100.0)

## 2021-07-11 LAB — D-DIMER, QUANTITATIVE: D-Dimer, Quant: 0.74 ug/mL-FEU — ABNORMAL HIGH (ref 0.00–0.50)

## 2021-07-11 LAB — APTT: aPTT: 29 seconds (ref 24–36)

## 2021-07-11 LAB — LIPASE, BLOOD: Lipase: 39 U/L (ref 11–51)

## 2021-07-11 LAB — PREGNANCY, URINE: Preg Test, Ur: NEGATIVE

## 2021-07-11 IMAGING — CT CT ANGIO CHEST
2 of 6 series · 18 of 46 positions shown · IV contrast (omnipaque)
Comparison: [DATE], [DATE]

CLINICAL DATA: Intermittent shortness of breath for several weeks,
history of colon cancer

EXAM:
CT ANGIOGRAPHY CHEST WITH CONTRAST
TECHNIQUE: Multidetector CT imaging of the chest was performed using the
standard protocol during bolus administration of intravenous
contrast. Multiplanar CT image reconstructions and MIPs were
obtained to evaluate the vascular anatomy.
CONTRAST:  80mL OMNIPAQUE IOHEXOL 350 MG/ML SOLN

[Series 3: thins · axial · 0.73mm/px · z∈[-233,+40]mm · 15 of 300 slices shown]
[im 14/300  lung]
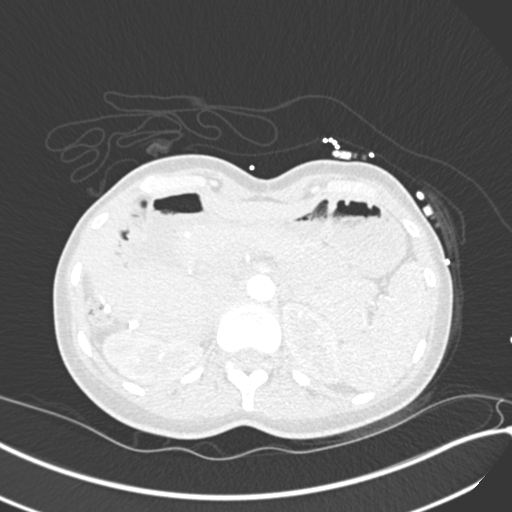
[im 40/300  soft-tissue]
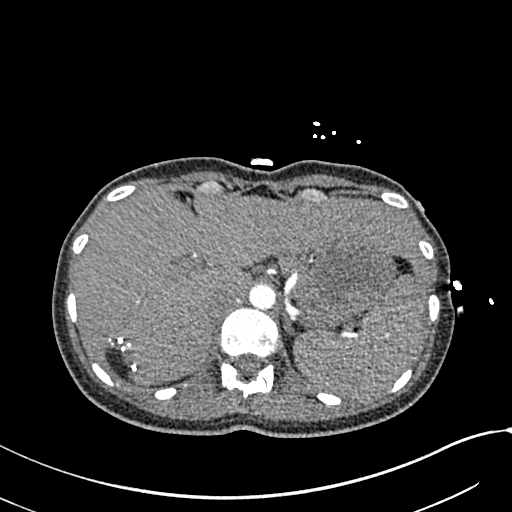
[im 53/300  lung]
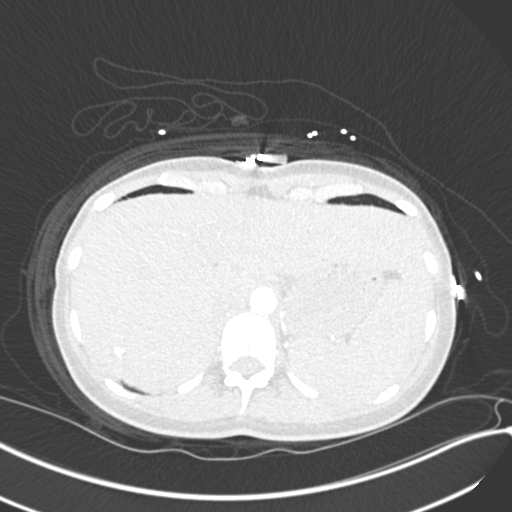
[im 79/300  soft-tissue]
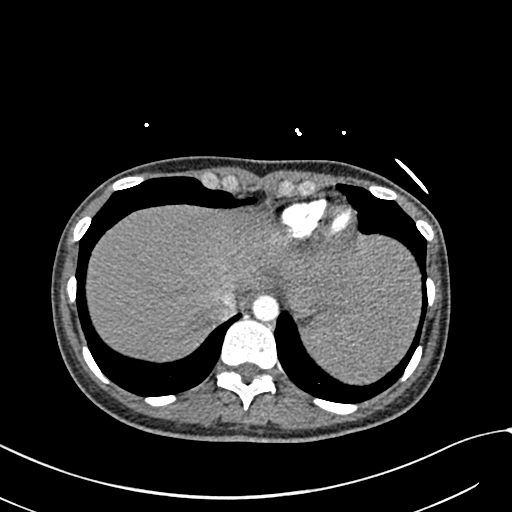
[im 92/300  lung]
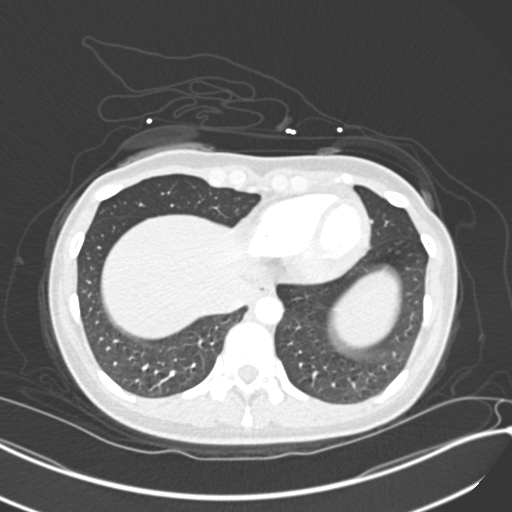
[im 118/300  soft-tissue]
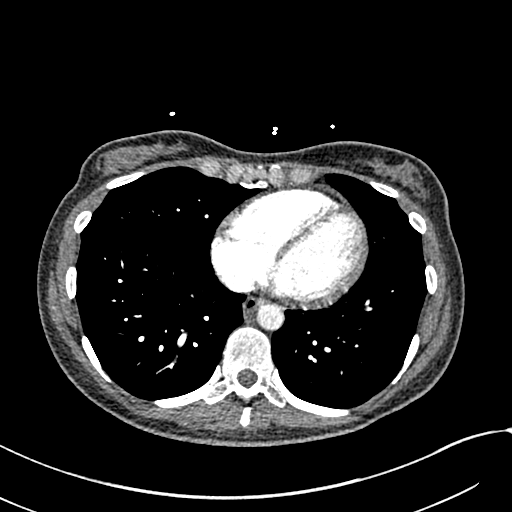
[im 131/300  lung]
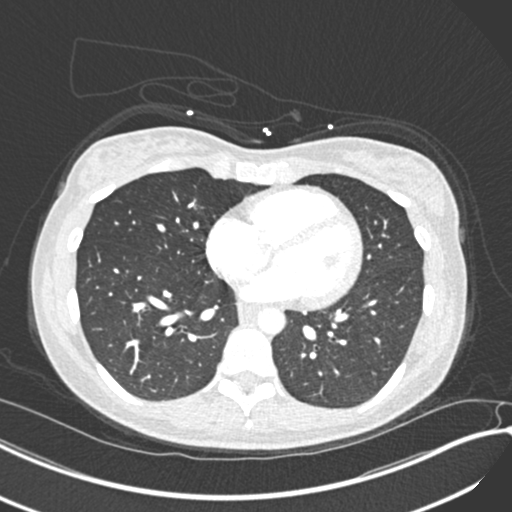
[im 157/300  soft-tissue]
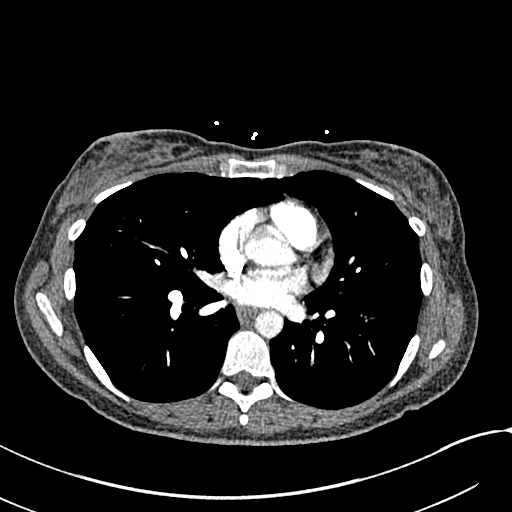
[im 170/300  lung]
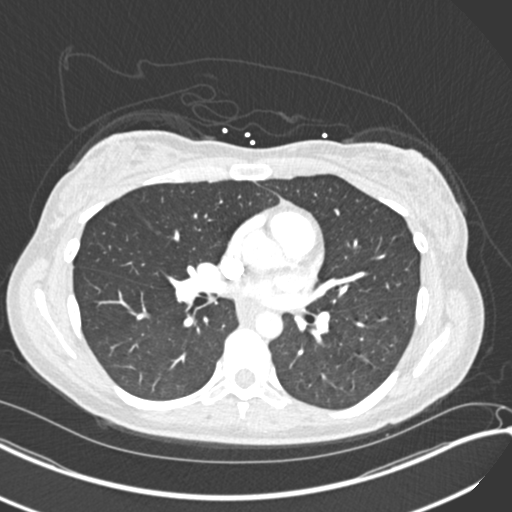
[im 183/300  soft-tissue]
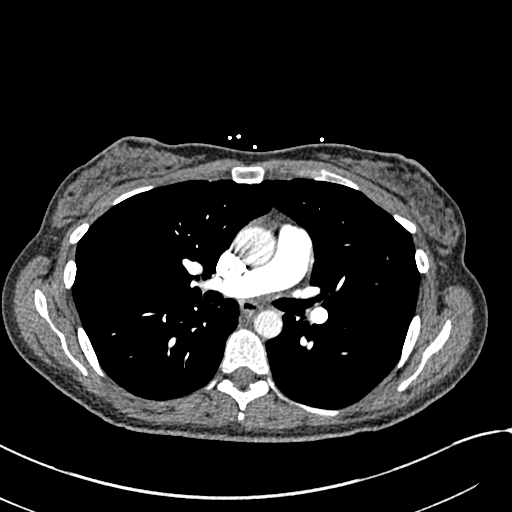
[im 209/300  lung]
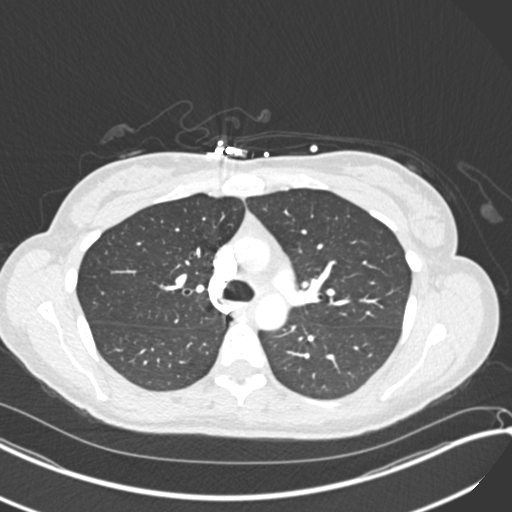
[im 222/300  soft-tissue]
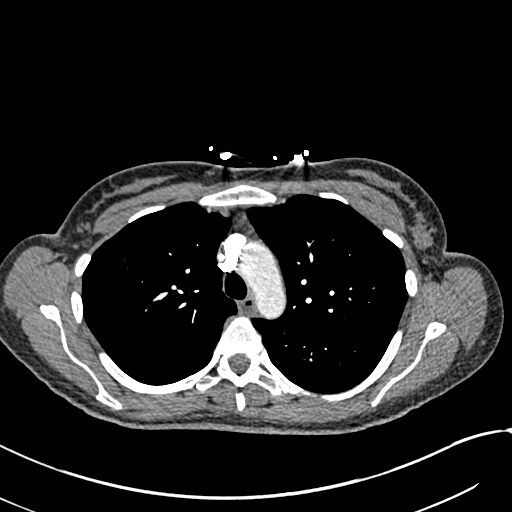
[im 248/300  lung]
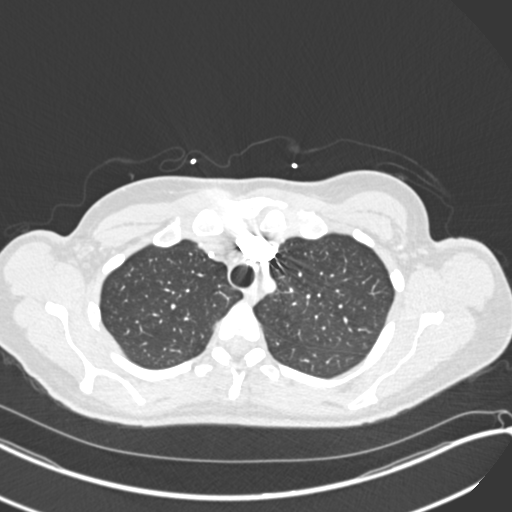
[im 261/300  soft-tissue]
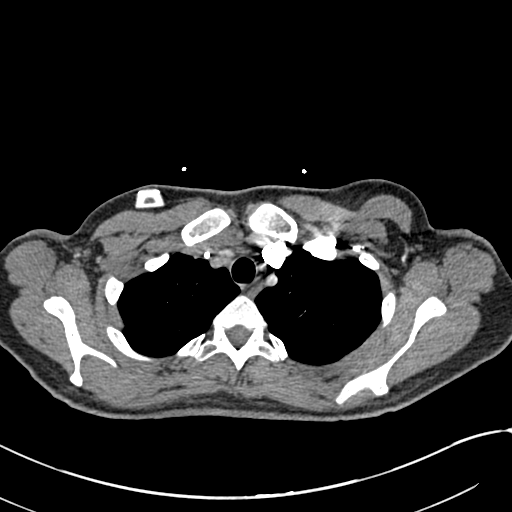
[im 287/300  lung]
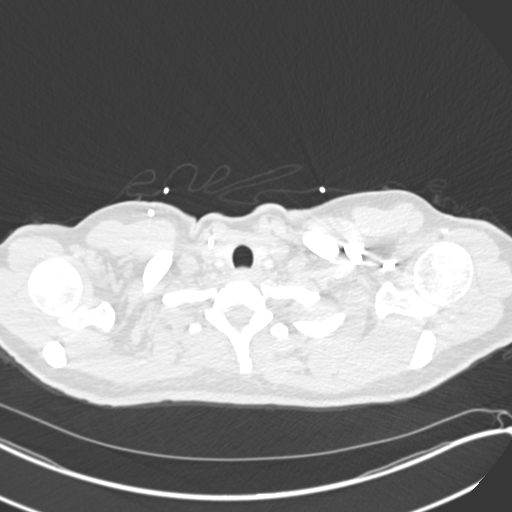

[Series 4: coronal mpr · coronal · 0.64mm/px · 3 of 128 slices shown]
[im 32/128  soft-tissue]
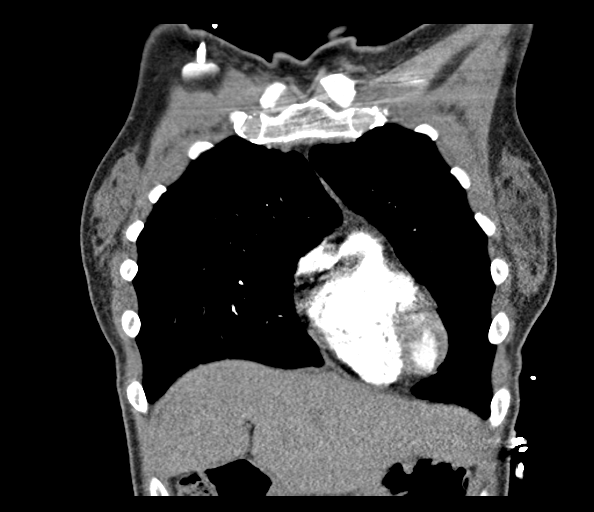
[im 64/128  soft-tissue]
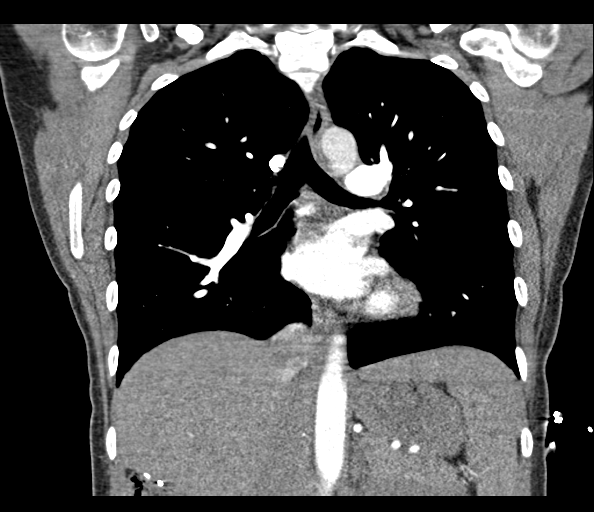
[im 96/128  soft-tissue]
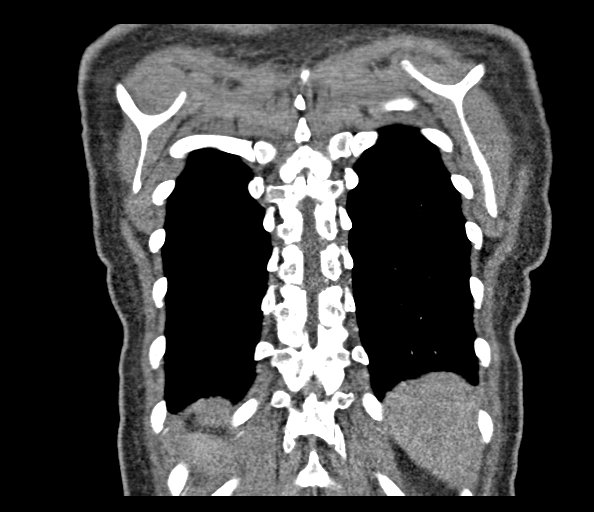

[18 of 46 positions shown; findings below may reference images not displayed]

FINDINGS: Cardiovascular: This is a technically adequate evaluation of the
pulmonary vasculature. No filling defects or pulmonary emboli.

The heart and great vessels are unremarkable without pericardial
effusion.

Mediastinum/Nodes: No enlarged mediastinal, hilar, or axillary lymph
nodes. Thyroid gland, trachea, and esophagus demonstrate no
significant findings. Stable right chest wall port, tip within the
superior vena cava.

Lungs/Pleura: No acute airspace disease, effusion, or pneumothorax.
Central airways are patent.

Upper Abdomen: Postsurgical changes from partial right lobe liver
resection. No acute upper abdominal findings.

Musculoskeletal: No acute or destructive bony lesions. Reconstructed
images demonstrate no additional findings.

Review of the MIP images confirms the above findings.
IMPRESSION: 1. No evidence of pulmonary embolus.
2. No acute intrathoracic metastases.
3. Stable postsurgical changes right lobe liver.

## 2021-07-11 IMAGING — CT CT ABD-PELV W/ CM
2 of 4 series · 15 of 46 positions shown, 17 images · IV contrast (omnipaque)
Comparison: [DATE]

CLINICAL DATA: Abdominal distention.  History of colon cancer.

EXAM:
CT ABDOMEN AND PELVIS WITH CONTRAST
TECHNIQUE: Multidetector CT imaging of the abdomen and pelvis was performed
using the standard protocol following bolus administration of
intravenous contrast.
CONTRAST:  80mL OMNIPAQUE IOHEXOL 350 MG/ML SOLN

[Series 4: axial st · axial · 0.83mm/px · z∈[-583,-158]mm · 12 of 99 slices shown, 14 images]
[im 7/99  soft-tissue]
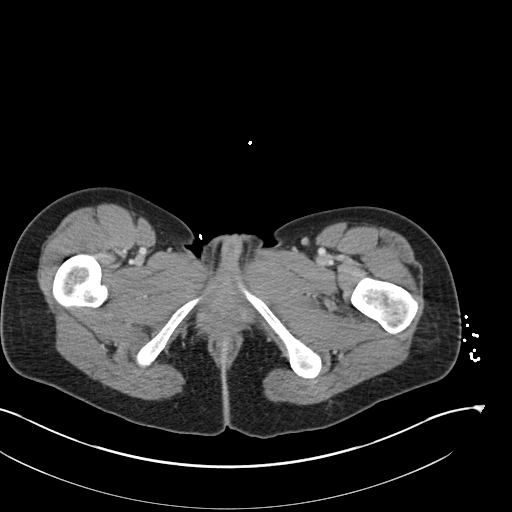
[im 7/99  bone]
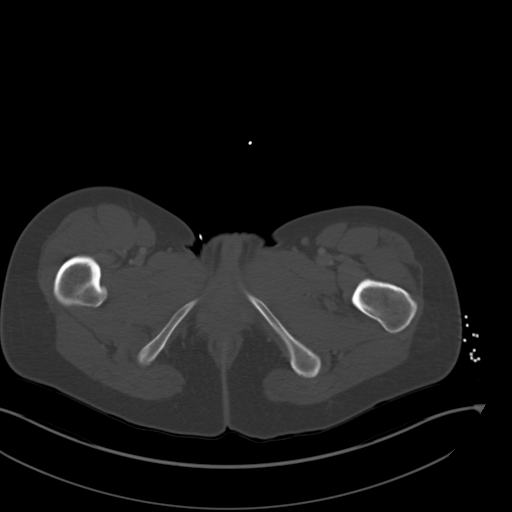
[im 13/99  soft-tissue]
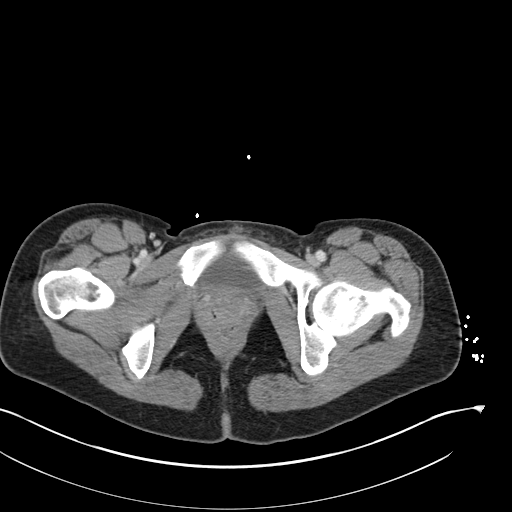
[im 25/99  soft-tissue]
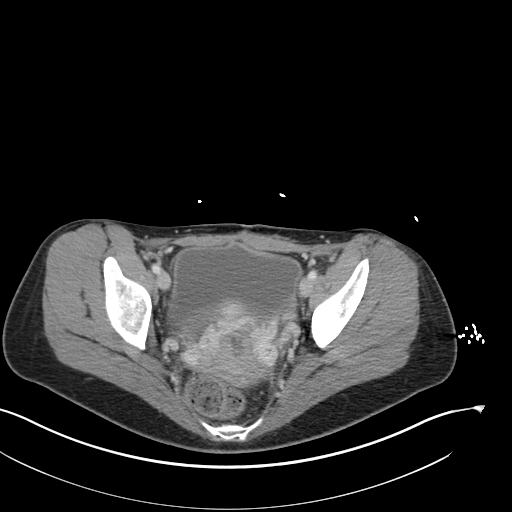
[im 31/99  soft-tissue]
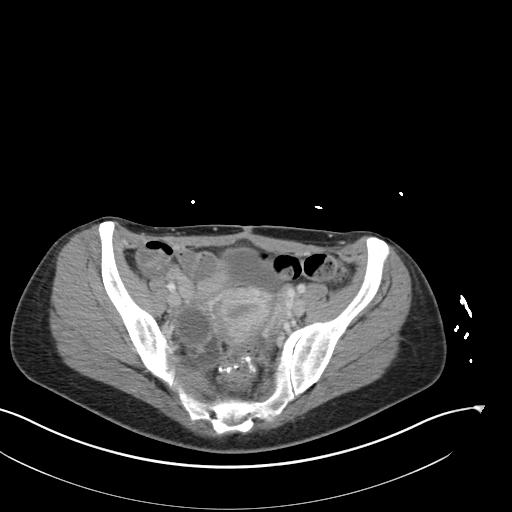
[im 37/99  soft-tissue]
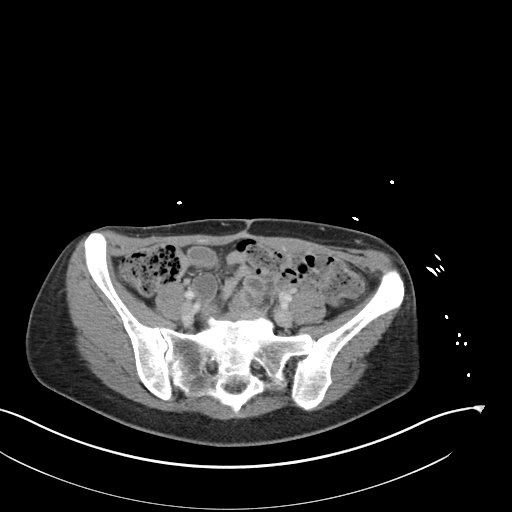
[im 43/99  soft-tissue]
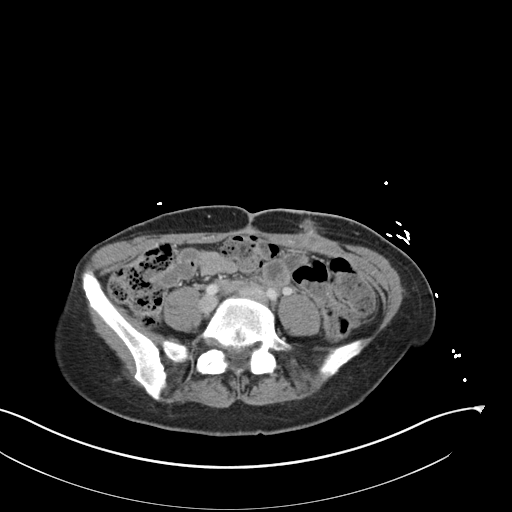
[im 56/99  soft-tissue]
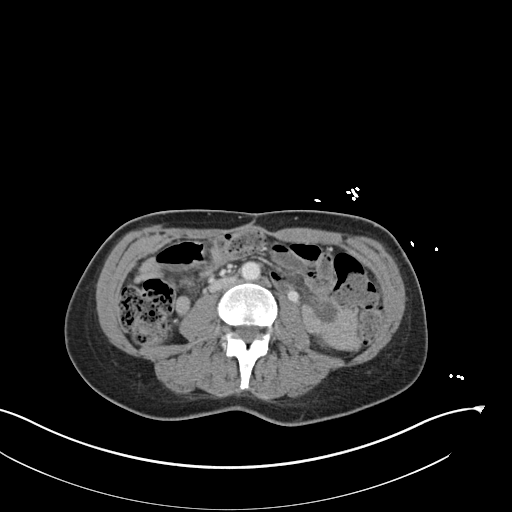
[im 62/99  soft-tissue]
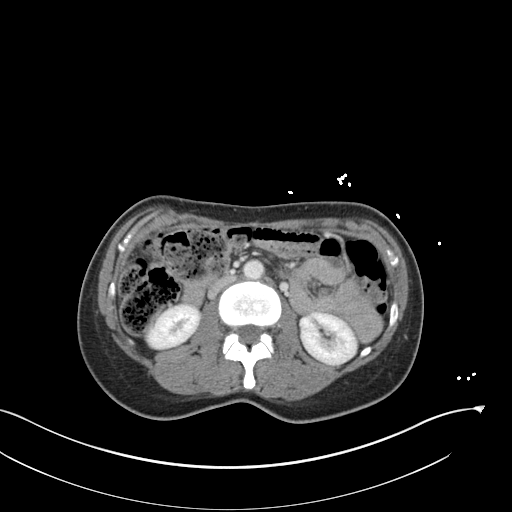
[im 68/99  soft-tissue]
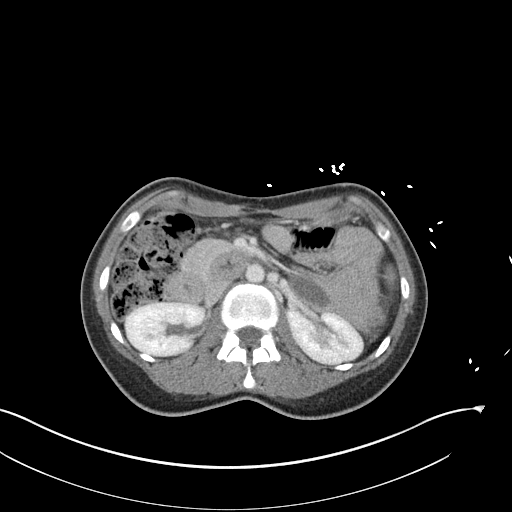
[im 68/99  bone]
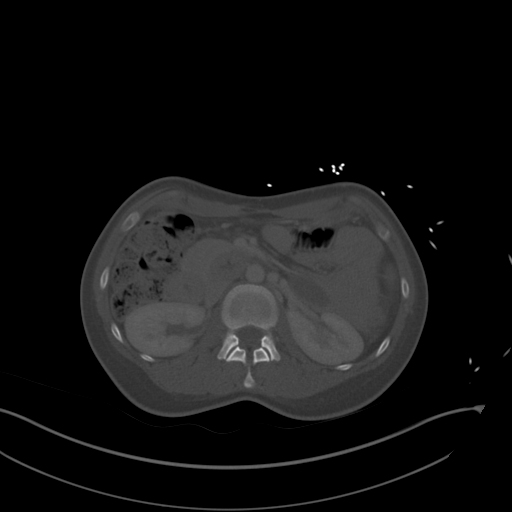
[im 74/99  soft-tissue]
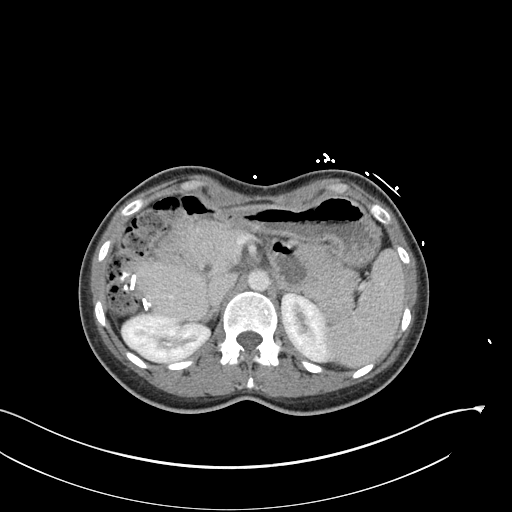
[im 86/99  soft-tissue]
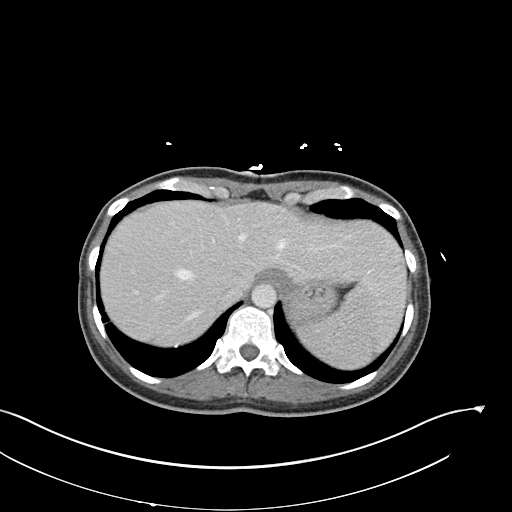
[im 92/99  soft-tissue]
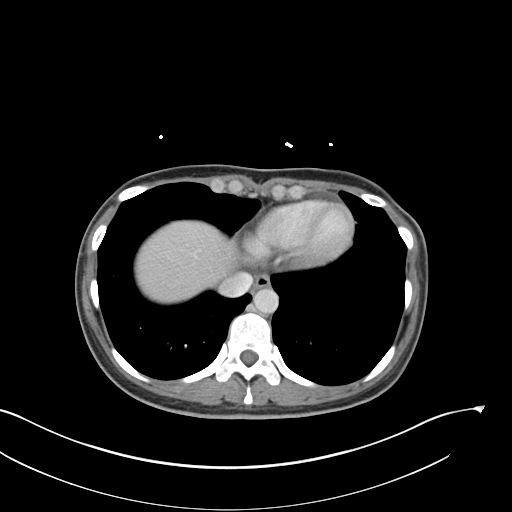

[Series 7: coronal st · coronal · 0.83mm/px · 3 of 87 slices shown]
[im 29/87  soft-tissue]
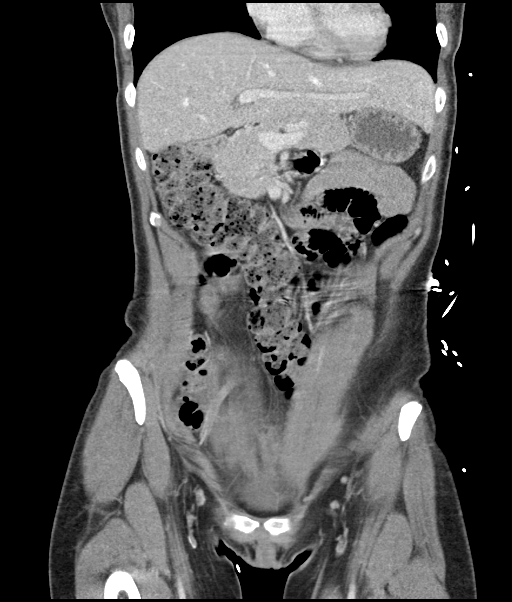
[im 39/87  soft-tissue]
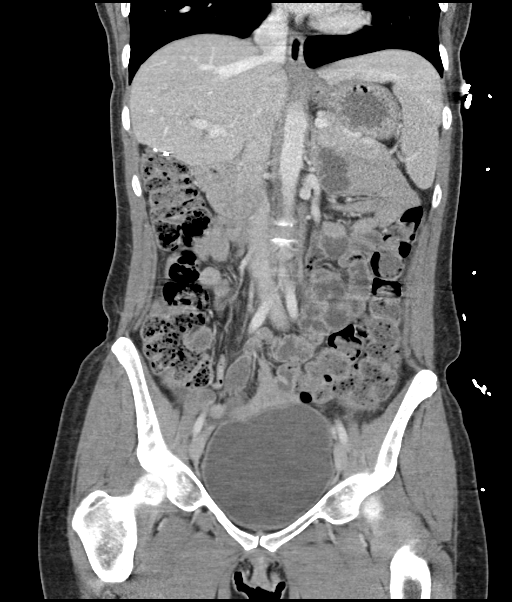
[im 48/87  soft-tissue]
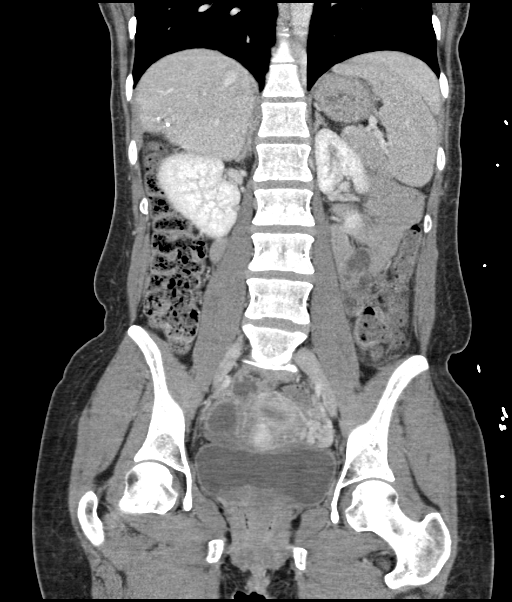

[15 of 46 positions shown; findings below may reference images not displayed]

FINDINGS: Lower chest: Lung bases are clear.

Hepatobiliary: Postoperative changes in segments 6 and 7 of the
liver with focal defect and surgical clips consistent with previous
resection. No residual mass lesion is identified. No other focal
liver lesions. Portal veins are patent.

Pancreas: Unremarkable. No pancreatic ductal dilatation or
surrounding inflammatory changes.

Spleen: Normal in size without focal abnormality.

Adrenals/Urinary Tract: No adrenal gland nodules. Kidneys are
symmetrical. Nephrograms are homogeneous. No hydronephrosis or
hydroureter. Bladder is unremarkable.

Stomach/Bowel: Stomach, small bowel, and colon are not abnormally
distended. Diffusely stool-filled colon. Since the previous study,
there has been a takedown of the descending colostomy with surgical
anastomosis at the rectosigmoid junction. No definite evidence of
any residual or recurrent mass. Appendix is normal.

Vascular/Lymphatic: No significant vascular findings are present. No
enlarged abdominal or pelvic lymph nodes.

Reproductive: Uterus is not enlarged. Enhancing myometrial nodule
anteriorly suggesting a fibroid. Cystic structure in the right
pelvis likely represents a right ovarian cyst, measuring 3.8 cm
diameter. This is enlarging since the previous study. Prominent
pelvic vein varices.

Other: No free air or free fluid in the abdomen. Small periumbilical
hernia containing a portion of the colon without full thickness
involvement. No proximal obstruction.

Musculoskeletal: No acute or significant osseous findings.
IMPRESSION: 1. No evidence of bowel obstruction or inflammation. Interval take
down of previous left lower TOCHA colostomy with sigmoid
anastomosis. Partial herniation of the transverse colon at the
umbilicus without proximal obstruction. Diffusely stool-filled
colon.
2. Postoperative resection at the right lobe of the liver. No
residual or recurrent liver lesions are demonstrated.
3. Uterine fibroid.
4. 3.8 cm diameter right ovarian cyst. No follow-up imaging
recommended. Note: This recommendation does not apply to
premenarchal patients and to those with increased risk (genetic,
family history, elevated tumor markers or other high-risk factors)
of ovarian cancer. Reference: JACR [DATE]):[id number]

## 2021-07-11 MED ORDER — SODIUM CHLORIDE 0.9 % IV BOLUS
1000.0000 mL | Freq: Once | INTRAVENOUS | Status: AC
  Administered 2021-07-11: 1000 mL via INTRAVENOUS

## 2021-07-11 MED ORDER — ONDANSETRON HCL 4 MG/2ML IJ SOLN
4.0000 mg | Freq: Once | INTRAMUSCULAR | Status: AC
  Administered 2021-07-11: 4 mg via INTRAVENOUS
  Filled 2021-07-11: qty 2

## 2021-07-11 MED ORDER — MORPHINE SULFATE (PF) 4 MG/ML IV SOLN
4.0000 mg | Freq: Once | INTRAVENOUS | Status: AC
  Administered 2021-07-11: 4 mg via INTRAVENOUS
  Filled 2021-07-11: qty 1

## 2021-07-11 MED ORDER — CEPHALEXIN 500 MG PO CAPS
500.0000 mg | ORAL_CAPSULE | Freq: Once | ORAL | Status: AC
  Administered 2021-07-11: 500 mg via ORAL
  Filled 2021-07-11: qty 1

## 2021-07-11 MED ORDER — CEPHALEXIN 500 MG PO CAPS: 500.0000 mg | ORAL_CAPSULE | Freq: Three times a day (TID) | ORAL | 0 refills | Status: DC

## 2021-07-11 MED ORDER — IOHEXOL 350 MG/ML SOLN
80.0000 mL | Freq: Once | INTRAVENOUS | Status: AC | PRN
  Administered 2021-07-11: 80 mL via INTRAVENOUS

## 2021-07-11 NOTE — ED Provider Notes (Addendum)
[facility] DEPT Provider Note   CSN: [id number] Arrival date & time: 07/11/21  1308     History Chief Complaint  Patient presents with   Chest Pain   Flank Pain   Fatigue    [name] is a 40 y.o. female hx of colon cancer with mets to the liver status post partial liver resection, colostomy with reversal, here presenting with abdominal pain and shortness of breath.  Patient states that she has been having right upper quadrant and right flank pain for the last several days.  Also subjective shortness of breath as well.  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 Denies any fevers.  The history is provided by the patient.      Past Medical History:  Diagnosis Date   Anxiety    Blood transfusion without reported diagnosis    Bowel obstruction (HCC)    BV (bacterial vaginosis)    Colon cancer ([place])    Colon Cancer 03-2020   Colon polyps    Colostomy present [facility])    03-2020   Family history of bladder cancer    History of chemotherapy    ended 09-2020   Lactose intolerance 03/12/2020   Neuromuscular disorder (HCC)    neuropathy feet hands legs   UTI (lower urinary tract infection)     Patient Active Problem List   Diagnosis Date Noted   Rheumatic fever without heart involvement 05/30/2021   Colorectal carcinoma ([place]) 03/08/2021   Genetic testing 01/28/2021   Abdominal fluid collection 12/31/2020   Metastatic colon cancer to liver ([place]) 12/13/2020   Family history of bladder cancer    Port-A-Cath in place 05/03/2020   Sepsis ([place]) 04/04/2020   SOB (shortness of breath) 04/04/2020   Moderate protein-calorie malnutrition (HCC) 04/04/2020   Nausea & vomiting 04/04/2020   TOA (tubo-ovarian abscess)    Intra-abdominal abscess ([place]) 03/31/2020   Lactose intolerance 03/12/2020   Microcytic anemia 03/12/2020   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    Peritonitis with abscess of intestine ([place]) 03/12/2020   Recurrent urinary tract infection 06/01/2012   Contraception, device intrauterine 03/23/2012   Genital herpes simplex 02/09/2012   Attention deficit disorder 02/27/2010   Anxiety state 02/27/2010    Past Surgical History:  Procedure Laterality Date   CESAREAN SECTION     CHOLECYSTECTOMY  12/13/2020   COLECTOMY  03/2020   COLONOSCOPY     COLOSTOMY TAKEDOWN N/A 05/30/2021   Procedure: LAPAROSCOPIC ASSISTED COLOSTOMY TAKEDOWN;  Surgeon: [name], MD;  Location: [facility];  Service: General;  Laterality: N/A;   CYSTOSCOPY WITH STENT PLACEMENT  04/04/2020   Procedure: CYSTOSCOPY WITH STENT PLACEMENT;  Surgeon: [name], MD;  Location: [facility];  Service: General;;   LAPAROSCOPIC LIVER ULTRASOUND N/A 12/13/2020   Procedure: INTRAOPERATIVE LIVER ULTRASOUND;  Surgeon: [name], MD;  Location: [facility];  Service: General;  Laterality: N/A;   LAPAROSCOPY N/A 12/13/2020   Procedure: LAPAROSCOPY DIAGNOSTIC;  Surgeon: [name], MD;  Location: [facility];  Service: General;  Laterality: N/A;   LAPAROTOMY N/A 04/04/2020   Procedure: EXPLORATORY LAPAROTOMY;  Surgeon: [name], MD;  Location: [facility];  Service: General;  Laterality: N/A;   OPEN PARTIAL HEPATECTOMY  N/A 12/13/2020   Procedure: OPEN PARTIAL HEPATECTOMY;  Surgeon: [name], MD;  Location: [facility];  Service: General;  Laterality: N/A;  ROOM 2 STARTING AT 09:30AM FOR 300 MIN   PORTACATH PLACEMENT  Right 04/12/2020   Procedure: INSERTION PORT-A-CATH WITH ULTRASOUND;  Surgeon: Kinsinger, Arta Bruce, MD;  Location: [facility];  Service: General;  Laterality: Right;     OB History     Gravida  3   Para  1   Term  1   Preterm      AB  1   Living  1      SAB      IAB  1   Ectopic      Multiple      Live Births              Family History  Problem Relation Age of Onset   Irritable bowel syndrome Mother    Heart disease Father    Stroke Father     Aneurysm Father    Irritable bowel syndrome Brother    Other Brother        gluten intolerance   Bladder Cancer Maternal Grandmother        dx 90s   Colon cancer Neg Hx    Esophageal cancer Neg Hx    Colon polyps Neg Hx    Stomach cancer Neg Hx    Rectal cancer Neg Hx     Social History   Tobacco Use   Smoking status: Former    Packs/day: 0.50    Years: 10.00    Pack years: 5.00    Types: Cigarettes    Quit date: 2018    Years since quitting: 4.7   Smokeless tobacco: Never  Vaping Use   Vaping Use: Never used  Substance Use Topics   Alcohol use: Not Currently    Comment: seldom   Drug use: No    Home Medications Prior to Admission medications   Medication Sig Start Date End Date Taking? Authorizing Provider  acetaminophen (TYLENOL) 325 MG tablet Take 2 tablets (650 mg total) by mouth every 6 (six) hours as needed for mild pain, moderate pain, fever or headache (fever > 101). 05/31/21   [name], MD  Ascorbic Acid (VITAMIN C PO) Take 1 tablet by mouth daily.    [provider]  Cholecalciferol (VITAMIN D3 PO) Take 1 tablet by mouth daily.    [provider]  clonazePAM (KLONOPIN) 0.5 MG tablet Take 1 tablet (0.5 mg total) by mouth daily as needed for anxiety. Patient not taking: Reported on 07/10/2021 05/27/21   [name], MD  meloxicam (MOBIC) 7.5 MG tablet Take 1 tablet (7.5 mg total) by mouth daily. Patient taking differently: Take 7.5 mg by mouth daily as needed for pain. 04/28/21   Montine Circle, PA-C  Metamucil Fiber CHEW Chew 3 tablets by mouth 3 (three) times daily.    [provider]  methocarbamol (ROBAXIN) 500 MG tablet Take 1 tablet (500 mg total) by mouth every 8 (eight) hours as needed for muscle spasms. Patient not taking: Reported on 07/10/2021 05/31/21   [name], MD  Prenatal Vit-Fe Fumarate-FA (PRENATAL MULTIVITAMIN) TABS tablet Take 1 tablet by mouth daily at 12 noon.    [provider]  zinc gluconate 50 MG  tablet Take 50 mg by mouth daily.    [provider]    Allergies    Oxaliplatin  Review of Systems   Review of Systems  Respiratory:  Positive for shortness of breath.   All other systems reviewed and are negative.  Physical Exam Updated Vital Signs BP 113/68   Pulse 71   Temp 97.7 F (36.5 C) (Oral)  Resp 12   LMP 06/27/2021   SpO2 100%   Physical Exam Vitals and nursing note reviewed.  Constitutional:      Comments: Chronically ill  HENT:     Head: Normocephalic.  Eyes:     Extraocular Movements: Extraocular movements intact.     Pupils: Pupils are equal, round, and reactive to light.  Cardiovascular:     Rate and Rhythm: Regular rhythm.  Pulmonary:     Effort: Pulmonary effort is normal.     Breath sounds: Normal breath sounds.  Abdominal:     General: Bowel sounds are normal.     Palpations: Abdomen is soft.     Comments: Complicated scar in the right upper quadrant and midline abdominal scar from previous surgeries.  Mild epigastric tenderness.  Mild right CVA tenderness  Musculoskeletal:        General: Normal range of motion.     Cervical back: Normal range of motion and neck supple.  Skin:    General: Skin is warm.     Capillary Refill: Capillary refill takes less than 2 seconds.  Neurological:     General: No focal deficit present.     Mental Status: She is alert and oriented to person, place, and time.  Psychiatric:        Mood and Affect: Mood normal.        Behavior: Behavior normal.    ED Results / Procedures / Treatments   Labs (all labs ordered are listed, but only abnormal results are displayed) Labs Reviewed  CBC WITH DIFFERENTIAL/PLATELET - Abnormal; Notable for the following components:      Result Value   Platelets 110 (*)    All other components within normal limits  URINALYSIS, ROUTINE W REFLEX MICROSCOPIC - Abnormal; Notable for the following components:   APPearance HAZY (*)    Leukocytes,Ua SMALL (*)    Bacteria, UA  RARE (*)    All other components within normal limits  D-DIMER, QUANTITATIVE - Abnormal; Notable for the following components:   D-Dimer, Quant 0.74 (*)    All other components within normal limits  RESP PANEL BY RT-PCR (FLU A&B, COVID) ARPGX2  COMPREHENSIVE METABOLIC PANEL  LIPASE, BLOOD  PROTIME-INR  APTT  BRAIN NATRIURETIC PEPTIDE  PREGNANCY, URINE  TROPONIN I (HIGH SENSITIVITY)  TROPONIN I (HIGH SENSITIVITY)    EKG EKG Interpretation  Date/Time:  Thursday July 11 2021 14:06:36 EDT Ventricular Rate:  84 PR Interval:  109 QRS Duration: 75 QT Interval:  371 QTC Calculation: 439 R Axis:   76 Text Interpretation: Sinus rhythm Atrial premature complex Short PR interval Low voltage, precordial leads No significant change since last tracing Confirmed by Wandra Arthurs [phone]) on 07/11/2021 9:35:04 PM  Radiology DG Chest 2 View  Result Date: 07/11/2021 CLINICAL DATA:  Shortness of breath and chest pressure. EXAM: CHEST - 2 VIEW COMPARISON:  December 30, 2020 FINDINGS: There is stable right-sided venous Port-A-Cath positioning. The heart size and mediastinal contours are within normal limits. Both lungs are clear. Radiopaque surgical clips are seen within the right upper quadrant. The visualized skeletal structures are unremarkable. IMPRESSION: No active cardiopulmonary disease. Electronically Signed   By: Virgina Norfolk M.D.   On: 07/11/2021 16:01   CT Angio Chest PE W and/or Wo Contrast  Result Date: 07/11/2021 CLINICAL DATA:  Intermittent shortness of breath for several weeks, history of colon cancer EXAM: CT ANGIOGRAPHY CHEST WITH CONTRAST TECHNIQUE: Multidetector CT imaging of the chest was performed using the standard protocol  during bolus administration of intravenous contrast. Multiplanar CT image reconstructions and MIPs were obtained to evaluate the vascular anatomy. CONTRAST:  46mL OMNIPAQUE IOHEXOL 350 MG/ML SOLN COMPARISON:  07/11/2021, 11/12/2020 FINDINGS: Cardiovascular:  This is a technically adequate evaluation of the pulmonary vasculature. No filling defects or pulmonary emboli. The heart and great vessels are unremarkable without pericardial effusion. Mediastinum/Nodes: No enlarged mediastinal, hilar, or axillary lymph nodes. Thyroid gland, trachea, and esophagus demonstrate no significant findings. Stable right chest wall port, tip within the superior vena cava. Lungs/Pleura: No acute airspace disease, effusion, or pneumothorax. Central airways are patent. Upper Abdomen: Postsurgical changes from partial right lobe liver resection. No acute upper abdominal findings. Musculoskeletal: No acute or destructive bony lesions. Reconstructed images demonstrate no additional findings. Review of the MIP images confirms the above findings. IMPRESSION: 1. No evidence of pulmonary embolus. 2. No acute intrathoracic metastases. 3. Stable postsurgical changes right lobe liver. Electronically Signed   By: Randa Ngo M.D.   On: 07/11/2021 22:01   CT ABDOMEN PELVIS W CONTRAST  Result Date: 07/11/2021 CLINICAL DATA:  Abdominal distention.  History of colon cancer. EXAM: CT ABDOMEN AND PELVIS WITH CONTRAST TECHNIQUE: Multidetector CT imaging of the abdomen and pelvis was performed using the standard protocol following bolus administration of intravenous contrast. CONTRAST:  14mL OMNIPAQUE IOHEXOL 350 MG/ML SOLN COMPARISON:  05/05/2021 FINDINGS: Lower chest: Lung bases are clear. Hepatobiliary: Postoperative changes in segments 6 and 7 of the liver with focal defect and surgical clips consistent with previous resection. No residual mass lesion is identified. No other focal liver lesions. Portal veins are patent. Pancreas: Unremarkable. No pancreatic ductal dilatation or surrounding inflammatory changes. Spleen: Normal in size without focal abnormality. Adrenals/Urinary Tract: No adrenal gland nodules. Kidneys are symmetrical. Nephrograms are homogeneous. No hydronephrosis or hydroureter.  Bladder is unremarkable. Stomach/Bowel: Stomach, small bowel, and colon are not abnormally distended. Diffusely stool-filled colon. Since the previous study, there has been a takedown of the descending colostomy with surgical anastomosis at the rectosigmoid junction. No definite evidence of any residual or recurrent mass. Appendix is normal. Vascular/Lymphatic: No significant vascular findings are present. No enlarged abdominal or pelvic lymph nodes. Reproductive: Uterus is not enlarged. Enhancing myometrial nodule anteriorly suggesting a fibroid. Cystic structure in the right pelvis likely represents a right ovarian cyst, measuring 3.8 cm diameter. This is enlarging since the previous study. Prominent pelvic vein varices. Other: No free air or free fluid in the abdomen. Small periumbilical hernia containing a portion of the colon without full thickness involvement. No proximal obstruction. Musculoskeletal: No acute or significant osseous findings. IMPRESSION: 1. No evidence of bowel obstruction or inflammation. Interval take down of previous left lower troche mint colostomy with sigmoid anastomosis. Partial herniation of the transverse colon at the umbilicus without proximal obstruction. Diffusely stool-filled colon. 2. Postoperative resection at the right lobe of the liver. No residual or recurrent liver lesions are demonstrated. 3. Uterine fibroid. 4. 3.8 cm diameter right ovarian cyst. No follow-up imaging recommended. Note: This recommendation does not apply to premenarchal patients and to those with increased risk (genetic, family history, elevated tumor markers or other high-risk factors) of ovarian cancer. Reference: JACR 2020 Feb; 17(2):248-254 Electronically Signed   By: Lucienne Capers M.D.   On: 07/11/2021 22:05    Procedures Procedures   Medications Ordered in ED Medications  cephALEXin (KEFLEX) capsule 500 mg (has no administration in time range)  sodium chloride 0.9 % bolus 1,000 mL (0 mLs  Intravenous Stopped 07/11/21 2305)  morphine 4  MG/ML injection 4 mg (4 mg Intravenous Given 07/11/21 2156)  ondansetron (ZOFRAN) injection 4 mg (4 mg Intravenous Given 07/11/21 2156)  iohexol (OMNIPAQUE) 350 MG/ML injection 80 mL (80 mLs Intravenous Contrast Given 07/11/21 2145)    ED Course  I have reviewed the triage vital signs and the nursing notes.  Pertinent labs & imaging results that were available during my care of the patient were reviewed by me and considered in my medical decision making (see chart for details).    MDM Rules/Calculators/A&P                           SENAI RAMNATH is a 40 y.o. female here presenting with abdominal pain and shortness of breath.  Patient does have colon cancer with mets to the liver and previous liver resection and bowel resection.  We will get CTA chest to rule out PE and CT abdomen pelvis.  11:23 PM UA showed possible UTI.  Patient has frequent UTIs.  CT showed no recurrent cancer and no PE. Patient has stool filled colon.  We will give a course of antibiotics.  Stable for discharge. COVID and flu test is negative   Final Clinical Impression(s) / ED Diagnoses Final diagnoses:  None    Rx / DC Orders ED Discharge Orders     None        Drenda Freeze, MD 07/11/21 2326    Drenda Freeze, MD 07/11/21 [id number]

## 2021-07-11 NOTE — Discharge Instructions (Signed)
Take Keflex 3 times a day for 5 days   See your doctor   You don't have recurrent cancer or blood clots   Return to ER if you have worse abdominal pain, flank pain, chest pain, trouble breathing

## 2021-07-11 NOTE — ED Triage Notes (Signed)
Patient here from home reporting dull pain to RUQ to into chest. Hx of colon cancer with colostomy take down. Denies n/v. Reports weakness.

## 2021-07-11 NOTE — ED Provider Notes (Signed)
Emergency Medicine Provider Triage Evaluation Note  Stephanie [name] , a 40 y.o. female  was evaluated in triage.  Pt complains of sob off and on for a few weeks. Hx of colon cancer, had a resection 7 mos ago of liver. Having dull achy pan in the RUQ radiating to gallbladder. Makes her feel weak. S/p cholecystectomy. Hx of colostomy take down.  Very fatigued.  Review of Systems  Positive: Sob/ cp, lighteheaded Negative: SOB  Physical Exam  BP (!) 134/107 (BP Location: Left Arm)   Pulse 82   Temp 97.7 F (36.5 C) (Oral)   Resp 18   LMP 06/27/2021   SpO2 98%  Gen:   Awake, no distress   Resp:  Normal effort  MSK:   Moves extremities without difficulty  Other:  No resp distress.   Medical Decision Making  Medically screening exam initiated at 2:05 PM.  Appropriate orders placed.  Stephanie [name] was informed that the remainder of the evaluation will be completed by another provider, this initial triage assessment does not replace that evaluation, and the importance of remaining in the ED until their evaluation is complete.  Labs intitiated.   Margarita Mail, PA-C 07/11/21 1408    Blanchie Dessert, MD 07/11/21 1650

## 2021-07-12 ENCOUNTER — Other Ambulatory Visit: Payer: Self-pay | Admitting: *Deleted

## 2021-07-12 ENCOUNTER — Telehealth: Payer: Self-pay | Admitting: *Deleted

## 2021-07-12 DIAGNOSIS — C19 Malignant neoplasm of rectosigmoid junction: Secondary | ICD-10-CM

## 2021-07-12 NOTE — Telephone Encounter (Signed)
Patient called to inform Dr. Burr Medico she was seen and treated yesterday in ED for being short of breath and was discharged with treatment for UTI.  She also wanted MD to know that they drew many labs and scans [D-dimer,CT angio and CTAbd/Pelvis]  Her appt with Dr. Burr Medico is 11/4 and she was supposed to get CTs before then. She wants to know if she'll still need CTs before her appointment. Dr. Burr Medico informed.  Contacted patient with Dr. Ernestina Penna response: CTs and labs reviewed. Results look good. Will discuss w/patient at appt on 08/02/21. CTs ordered by Dr. Burr Medico can be cancelled at this time. Lab/flush appt made on 08/02/21 for standing labs. Patient verbalized understanding of all information

## 2021-07-18 ENCOUNTER — Ambulatory Visit (INDEPENDENT_AMBULATORY_CARE_PROVIDER_SITE_OTHER): Payer: Medicaid Other | Admitting: Clinical

## 2021-07-18 ENCOUNTER — Other Ambulatory Visit: Payer: Self-pay

## 2021-07-18 DIAGNOSIS — F4322 Adjustment disorder with anxiety: Secondary | ICD-10-CM | POA: Diagnosis not present

## 2021-07-18 NOTE — Progress Notes (Signed)
   THERAPIST PROGRESS NOTE  Session Time: 10am  Participation Level: Active  Behavioral Response: NAAlertAnxious  Type of Therapy: Individual Therapy  Treatment Goals addressed: Anxiety and Coping  Interventions: CBT  Summary:  Pt presents in an anxious and tearful mood. Pt reports her 41 yr old daughter has been caught Editor, commissioning with a female on a unfamiliar website. Pt describes daughter as being "defiant" and having difficulty managing  her behavior. Per pt request, Probation officer provided pt with contact information of [place] and [employer] for community mental health resources for her child. Pt reports she is practicing guided meditation daily to assist with mood management.  Suicidal/Homicidal: Pt denies SI/HI no plan, intent or attempt to harm self or others reported.  Therapist Response: Reviewed and provided pt with information on coping skills to manage anxiety(deep breathing, progressive muscle relaxation). Prompted pt to practice/engage in deep breathing and progressive muscle relaxation exercises during session to manage emotions. Probed for feedback on how the feeling of relaxation differs from the feeling of tension. Further discuss interventions next session, pt encouraged to practice.  Plan: Return again in 2 weeks.  Diagnosis: Axis I: Adjustment disorder with anxious mood    Axis II: No diagnosis    Yvette Rack, LCSW 07/18/2021

## 2021-07-31 ENCOUNTER — Inpatient Hospital Stay: Payer: Medicaid Other

## 2021-08-02 ENCOUNTER — Other Ambulatory Visit: Payer: Self-pay

## 2021-08-02 ENCOUNTER — Inpatient Hospital Stay: Payer: Medicaid Other

## 2021-08-02 ENCOUNTER — Inpatient Hospital Stay: Payer: Medicaid Other | Attending: Nurse Practitioner | Admitting: Hematology

## 2021-08-02 VITALS — BP 101/69 | HR 70 | Temp 98.1°F | Resp 17 | Ht 68.0 in | Wt 150.4 lb

## 2021-08-02 DIAGNOSIS — C787 Secondary malignant neoplasm of liver and intrahepatic bile duct: Secondary | ICD-10-CM | POA: Diagnosis not present

## 2021-08-02 DIAGNOSIS — Z95828 Presence of other vascular implants and grafts: Secondary | ICD-10-CM

## 2021-08-02 DIAGNOSIS — D500 Iron deficiency anemia secondary to blood loss (chronic): Secondary | ICD-10-CM | POA: Diagnosis not present

## 2021-08-02 DIAGNOSIS — C19 Malignant neoplasm of rectosigmoid junction: Secondary | ICD-10-CM | POA: Insufficient documentation

## 2021-08-02 DIAGNOSIS — G620 Drug-induced polyneuropathy: Secondary | ICD-10-CM | POA: Diagnosis not present

## 2021-08-02 DIAGNOSIS — D696 Thrombocytopenia, unspecified: Secondary | ICD-10-CM | POA: Insufficient documentation

## 2021-08-02 DIAGNOSIS — F411 Generalized anxiety disorder: Secondary | ICD-10-CM

## 2021-08-02 LAB — CBC WITH DIFFERENTIAL (CANCER CENTER ONLY)
Abs Immature Granulocytes: 0.02 10*3/uL (ref 0.00–0.07)
Basophils Absolute: 0 10*3/uL (ref 0.0–0.1)
Basophils Relative: 0 %
Eosinophils Absolute: 0.1 10*3/uL (ref 0.0–0.5)
Eosinophils Relative: 1 %
HCT: 39.3 % (ref 36.0–46.0)
Hemoglobin: 12.9 g/dL (ref 12.0–15.0)
Immature Granulocytes: 0 %
Lymphocytes Relative: 26 %
Lymphs Abs: 1.2 10*3/uL (ref 0.7–4.0)
MCH: 30.6 pg (ref 26.0–34.0)
MCHC: 32.8 g/dL (ref 30.0–36.0)
MCV: 93.3 fL (ref 80.0–100.0)
Monocytes Absolute: 0.5 10*3/uL (ref 0.1–1.0)
Monocytes Relative: 10 %
Neutro Abs: 3 10*3/uL (ref 1.7–7.7)
Neutrophils Relative %: 63 %
Platelet Count: 111 10*3/uL — ABNORMAL LOW (ref 150–400)
RBC: 4.21 MIL/uL (ref 3.87–5.11)
RDW: 13.2 % (ref 11.5–15.5)
WBC Count: 4.7 10*3/uL (ref 4.0–10.5)
nRBC: 0 % (ref 0.0–0.2)

## 2021-08-02 LAB — CMP (CANCER CENTER ONLY)
ALT: 18 U/L (ref 0–44)
AST: 16 U/L (ref 15–41)
Albumin: 3.7 g/dL (ref 3.5–5.0)
Alkaline Phosphatase: 78 U/L (ref 38–126)
Anion gap: 6 (ref 5–15)
BUN: 15 mg/dL (ref 6–20)
CO2: 24 mmol/L (ref 22–32)
Calcium: 8.5 mg/dL — ABNORMAL LOW (ref 8.9–10.3)
Chloride: 110 mmol/L (ref 98–111)
Creatinine: 0.59 mg/dL (ref 0.44–1.00)
GFR, Estimated: >60 mL/min (ref >60)
Glucose, Bld: 97 mg/dL (ref 70–99)
Potassium: 4.2 mmol/L (ref 3.5–5.1)
Sodium: 140 mmol/L (ref 135–145)
Total Bilirubin: 0.3 mg/dL (ref 0.3–1.2)
Total Protein: 6.1 g/dL — ABNORMAL LOW (ref 6.5–8.1)

## 2021-08-02 LAB — CEA (IN HOUSE-CHCC): CEA (CHCC-In House): 1.84 ng/mL (ref 0.00–5.00)

## 2021-08-02 MED ORDER — SODIUM CHLORIDE 0.9% FLUSH
10.0000 mL | Freq: Once | INTRAVENOUS | Status: AC
  Administered 2021-08-02: 10 mL

## 2021-08-02 MED ORDER — CLONAZEPAM 0.5 MG PO TABS: 0.5000 mg | ORAL_TABLET | Freq: Every day | ORAL | 0 refills | Status: DC | PRN

## 2021-08-02 MED ORDER — HEPARIN SOD (PORK) LOCK FLUSH 100 UNIT/ML IV SOLN
500.0000 [IU] | Freq: Once | INTRAVENOUS | Status: AC
  Administered 2021-08-02: 500 [IU]

## 2021-08-02 NOTE — Progress Notes (Signed)
Stephanie [name]   Telephone:([phone]) [phone] [phone]   Clinic Follow up Note   Patient Care Team: Pcp, No as PCP - General [name], MD as Consulting Physician (Oncology) [name], MD as Consulting Physician (General Surgery)  Date of Service:  08/02/2021  CHIEF COMPLAINT: f/u of metastatic colon cancer  CURRENT THERAPY:  Surveillance  ASSESSMENT & PLAN:  Stephanie [name] is a 40 y.o. female with   1. 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 She was found to have perforation and positive radial margin. Liver biopsy on 04/16/2020 confirmed metastatic disease from her colon cancer -She received first line chemo with FOLFOXIRI q2 weeks for 6 months from 05/23/20 to 10/24/20. Bevacizumab-bvzr Stephanie [name]) added with C2.  -10/2020 CT Chest and MRI abdomen shows decreased oligo liver metastasis to 1.3cm, no evidence of other metastasis. -She underwent liver resection on 12/13/20 under Dr. Barry Dienes. Pathology showed: metastatic colon carcinoma to liver showing approximately 80% necrosis, resection margin negative.  -She underwent colostomy takedown on 05/30/21 with Dr. Barry Dienes. Pathology was benign. -since her last visit, she presented to ED 07/11/21 with abdominal distention. CT AP showed no evidence of obstruction, inflammation, or residual or recurrent liver lesions. I reviewed her scan images and discussed with her  -she is clinically doing well,  lab reviewed, exam unremarkable, will continue monitoring    2.  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 She notes she does drop small things. She rates it has 5/10. -She was enrolled in our clinical study on 03/07/21 but opted to withdraw on 03/29/21.   3. Anemia,  secondary to #1 and iron deficiency from GI Blood loss. Resolved s/p IV Feraheme in 02/2020 and 05/11/20.  -anemia resolved now    4. Mild thrombocytopenia -Secondary to chemotherapy -improving, up to 111k today (08/02/21)   5. Genetics  -Due to her young age, I recommended genetic testing to ruled out cancer syndrome.  She initially wanted to wait due to her insurance issue -Genetics consult on 12/04/20, genetic test was drawn 01/17/21 -Results were negative     Plan: -I refilled clonazepam today, she takes occasionally  -lab, flush, and survivorship with NP [name] in 6 weeks -f/u in 3 months with lab, flush, and CT CAP several days before   No problem-specific Assessment & Plan notes found for this encounter.   SUMMARY OF ONCOLOGIC HISTORY: Oncology History Overview Note  Cancer Staging Malignant neoplasm of rectosigmoid junction [facility]) Staging form: Colon and Rectum, AJCC 8th Edition - Pathologic stage from 04/04/2020: pT4a, pN1b, cM1 - Signed by [name], NP on 05/07/2020    Malignant neoplasm of rectosigmoid junction ([place])  11/10/2019 Imaging   MRI Abdomen  IMPRESSION: 1. Redemonstrated hypoenhancing lesion of the posterior liver dome, hepatic segment VII, reduced in size compared to prior examination, measuring 1.3 x 1.3 cm, previously 1.8 x 1.7 cm. Findings are consistent with treatment response of a biopsy proven metastasis. No other evidence of lymphadenopathy or metastatic disease within the abdomen or pelvis. 2. Unchanged mild splenomegaly, maximum coronal span 14.0 cm. 3. 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   03/12/2020 Initial Diagnosis   Malignant neoplasm of rectosigmoid junction ([place])   03/12/2020 Imaging   CT AP with contrast IMPRESSION: 1.  Overall findings are highly concerning for colorectal carcinoma involving the sigmoid colon with an associated perforation and adjacent abscess and phlegmon formation as detailed  above. Currently, no collection is amenable to percutaneous drainage given their small size and location. 2. New 2 cm mass in the right hepatic lobe concerning for metastatic disease to the liver until proven otherwise. 3. Enlarged regional lymph nodes as detailed above is concerning for nodal metastatic disease. 4. Large stool burden. 5. Prominent pelvic veins which can be seen in patients with pelvic congestion syndrome.   03/13/2020 Imaging   ABD US IMPRESSION: Approximately 2.1 x 2.4 x 2.0 cm lobular homogeneously echogenic lesion in the right hepatic dome corresponds with the abnormality seen on the prior CT scan. Sonographically, this appearance is highly suggestive of a benign hemangioma.   Recommend MRI of the abdomen with gadolinium contrast which may provide a noninvasive diagnosis of benign hemangioma.    03/13/2020 Imaging   MR ABD W/WO CONTRAST Hepatobiliary: Diffuse low signal intensity throughout the hepatic parenchyma on T2 weighted images, presumably a consequence of recent Feraheme injection. In segment 7 of the liver (axial image 8 of series 5) there is a 2.5 x 1.9 cm well-defined lesion which is slightly T2 hyperintense. This lesion appears hyperintense on pre gadolinium T1 weighted images (likely a consequence of Feraheme). Interpretation of enhancement within the lesion is compromised by presence of Feraheme. No other hepatic lesions are confidently identified on today's examination. No intra or extrahepatic biliary ductal dilatation. Gallbladder is normal in appearance.   03/31/2020 Imaging   CT AP W contrast IMPRESSION: 1. Previously noted sigmoid colon mass appears increased in size, and again appears to be associated with a focal contained perforation which crosses the midline and has fistulized into the left adnexal region where there is now what appears to be a large left tubo-ovarian abscess, as detailed above. This is also associated with multiple  enlarged lymph nodes in the pelvis measuring up to 1.2 cm in short axis and borderline enlarged retroperitoneal lymph nodes, concerning for metastatic disease. In addition, previously suspected metastatic lesion in segment 7 of the liver has enlarged. 2. Small volume of ascites. 3. Additional incidental findings, as above.   04/04/2020 Cancer Staging   Staging form: Colon and Rectum, AJCC 8th Edition - Pathologic stage from 04/04/2020: pT4a, pN1b, cM1 - Signed by [name], NP on 05/07/2020    04/04/2020 Procedure   Paracentesis, path showed no malignant cells (mixed acute and chronic inflammation present)   04/04/2020 Surgery   Open sigmoid colectomy and end colostomy by Dr. [name]   04/04/2020 Pathology Results   FINAL MICROSCOPIC DIAGNOSIS: A. COLON, RECTOSIGMOID, RESECTION: - Invasive moderately differentiated adenocarcinoma, 6 cm, involving rectosigmoid junction - Carcinoma invades into serosal surface with perforation and associated serositis - Radial resection margin is positive for carcinoma; proximal and distal margins are not involved - Lymphovascular invasion is present - Metastatic carcinoma to one of fifteen lymph nodes (1/15); one tumor deposit - See oncology table B. LYMPH NODES, MESENTERIC, RESECTION: - Metastatic adenocarcinoma to one of six lymph nodes (1/6) - One tumor deposit  Addendum to note 2 involved lymph nodes (of 21 examined nodes) pT4a,pN1b MMR-normal, preserved expression of MLH1, MSH2, MSH6, PMS2   04/12/2020 Procedure   PAC placement    04/13/2020 Imaging   CT chest without contrast IMPRESSION: Interval development of bilateral pleural effusions, left slightly greater than right, with resultant bibasilar atelectasis including subtotal collapse of the left lower lobe.  No evidence of intrathoracic metastatic disease, though evaluation of the collapsed parenchyma is limited. Hepatic metastasis again demonstrated.     04/16/2020 Pathology  Results   FINAL MICROSCOPIC DIAGNOSIS:  A. LIVER, RIGHT LOBE, BIOPSY:  - Adenocarcinoma.  COMMENT:  The morphology is compatible with the provided clinical history of colorectal carcinoma.    05/23/2020 - 10/24/2020 Chemotherapy   FOLFIRINOX q2weeks for 3-6 months starting 05/23/20. Bevacizumab-bvzr Stephanie [name]) added with C2. Oxaliplatin held with C11-12 due to reaction. (pt developed SOB, chest palpitation and abdominal discomfort shortly after oxaliplatin started). Completed on 10/24/20.   08/05/2020 Imaging   CT AP  IMPRESSION: 1. Postsurgical changes of distal colectomy with a left lower quadrant end ostomy. No evidence of obstruction or acute complication at this time. Excluded rectal pouch in the deep pelvis without acute complication or worrisome features. 2. Slight interval decrease in size of a hypoattenuating lesion posterior right lobe liver measuring 1.7 x 1.8 x 2 cm. This lesion has previously undergone ultrasound-guided biopsy with pathologic results demonstrating adenocarcinoma compatible with metastatic disease from patient's resected colorectal carcinoma. 3. Slight prominence of the parametrial vessels bilaterally, nonspecific though can be seen in the setting of pelvic congestion syndrome. 4. Mild splenomegaly.  No focal lesion.     11/12/2020 Imaging   CT Chest  IMPRESSION: 1. No evidence of metastatic disease in the chest. 2. 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   12/13/2020 Surgery   A. LIVER, RIGHT, PARTIAL HEPATECTOMY WITH GALLBLADDER:  - Metastatic colon carcinoma to the liver showing approximately 80%  necrosis  - Resection margin is 0.8 cm from carcinoma  - Uninvolved liver parenchyma with no specific histopathologic changes  - Gallbladder with no specific histopathologic changes    01/28/2021 Genetic Testing   Negative genetic testing:  No pathogenic variants detected on the [employer] CustomNext-Cancer + RNAinsight  panel. The report date is 01/28/2021.   The CustomNext-Cancer+RNAinsight panel offered by [facility] included sequencing and rearrangement analysis for the following 47 genes:  APC, ATM, AXIN2, BARD1, BMPR1A, BRCA1, BRCA2, BRIP1, CDH1, CDK4, CDKN2A, CHEK2, DICER1, EPCAM, GREM1, HOXB13, MEN1, MLH1, MSH2, MSH3, MSH6, MUTYH, NBN, NF1, NF2, NTHL1, PALB2, PMS2, POLD1, POLE, PTEN, RAD51C, RAD51D, RECQL, RET, SDHA, SDHAF2, SDHB, SDHC, SDHD, SMAD4, SMARCA4, STK11, TP53, TSC1, TSC2, and VHL.  RNA data is routinely analyzed for use in variant interpretation for all genes.   02/19/2021 Imaging   CT A/P w/o contrast  IMPRESSION: Slightly limited examination examination in absence of contrast administration. Status post partial right hepatectomy. Interval decrease in size in perihepatic fluid collection and resolution of right subdiaphragmatic fluid and gas. No new intra-abdominal fluid collections are identified.   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 Moderate stool throughout the colon without evidence of obstruction.   Fluid distension of the proximal duodenum to the level of the SMA hiatus which appears narrow. 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   05/05/2021 Imaging   CT AP  IMPRESSION: Postsurgical changes as described stable in appearance from the prior exam.   Changes suggestive of mild pelvic varices.      INTERVAL HISTORY:  Stephanie [name] is here for a follow up of metastatic colon cancer. She was last seen by me on 05/27/21. She presents to the clinic alone. She reports she has recovered well from surgery, despite having a bladder infection a few  weeks ago. She reports she ended up having to move to a new apartment because of neighbors smoking in a non-smoking apartment. She also asked my opinion about returning to work  full time. She reports her anxiety has improved recently.   All other systems were reviewed with the patient and are negative.  MEDICAL HISTORY:  Past Medical History:  Diagnosis Date   Anxiety    Blood transfusion without reported diagnosis    Bowel obstruction (HCC)    BV (bacterial vaginosis)    Colon cancer ([place])    Colon Cancer 03-2020   Colon polyps    Colostomy present ([place])    03-2020   Family history of bladder cancer    History of chemotherapy    ended 09-2020   Lactose intolerance 03/12/2020   Neuromuscular disorder (HCC)    neuropathy feet hands legs   UTI (lower urinary tract infection)     SURGICAL HISTORY: Past Surgical History:  Procedure Laterality Date   CESAREAN SECTION     CHOLECYSTECTOMY  12/13/2020   COLECTOMY  03/2020   COLONOSCOPY     COLOSTOMY TAKEDOWN N/A 05/30/2021   Procedure: LAPAROSCOPIC ASSISTED COLOSTOMY TAKEDOWN;  Surgeon: [name], MD;  Location: [place];  Service: General;  Laterality: N/A;   CYSTOSCOPY WITH STENT PLACEMENT  04/04/2020   Procedure: CYSTOSCOPY WITH STENT PLACEMENT;  Surgeon: [name], MD;  Location: [facility];  Service: General;;   LAPAROSCOPIC LIVER ULTRASOUND N/A 12/13/2020   Procedure: INTRAOPERATIVE LIVER ULTRASOUND;  Surgeon: [name], MD;  Location: [place];  Service: General;  Laterality: N/A;   LAPAROSCOPY N/A 12/13/2020   Procedure: LAPAROSCOPY DIAGNOSTIC;  Surgeon: [name], MD;  Location: [place];  Service: General;  Laterality: N/A;   LAPAROTOMY N/A 04/04/2020   Procedure: EXPLORATORY LAPAROTOMY;  Surgeon: [name], MD;  Location: [facility];  Service: General;  Laterality: N/A;   OPEN PARTIAL HEPATECTOMY  N/A 12/13/2020   Procedure: OPEN PARTIAL HEPATECTOMY;  Surgeon: [name], MD;  Location: [place];  Service: General;  Laterality: N/A;  ROOM 2 STARTING AT 09:30AM FOR 300 MIN   PORTACATH PLACEMENT Right 04/12/2020   Procedure: INSERTION PORT-A-CATH WITH ULTRASOUND;  Surgeon: Kinsinger, Arta Bruce,  MD;  Location: [facility];  Service: General;  Laterality: Right;    I have reviewed the social history and family history with the patient and they are unchanged from previous note.  ALLERGIES:  is allergic to oxaliplatin.  MEDICATIONS:  Current Outpatient Medications  Medication Sig Dispense Refill   acetaminophen (TYLENOL) 325 MG tablet Take 2 tablets (650 mg total) by mouth every 6 (six) hours as needed for mild pain, moderate pain, fever or headache (fever > 101).     Ascorbic Acid (VITAMIN C PO) Take 1 tablet by mouth daily.     cephALEXin (KEFLEX) 500 MG capsule Take 1 capsule (500 mg total) by mouth 3 (three) times daily. 15 capsule 0   Cholecalciferol (VITAMIN D3 PO) Take 1 tablet by mouth daily.     clonazePAM (KLONOPIN) 0.5 MG tablet Take 1 tablet (0.5 mg total) by mouth daily as needed for anxiety. (Patient not taking: Reported on 07/10/2021) 15 tablet 0   meloxicam (MOBIC) 7.5 MG tablet Take 1 tablet (7.5 mg total) by mouth daily. (Patient taking differently: Take 7.5 mg by mouth daily as needed for pain.) 20 tablet 0   Metamucil Fiber CHEW Chew 3 tablets by mouth 3 (three) times daily.     methocarbamol (ROBAXIN) 500 MG tablet Take  1 tablet (500 mg total) by mouth every 8 (eight) hours as needed for muscle spasms. (Patient not taking: Reported on 07/10/2021) 20 tablet 1   Prenatal Vit-Fe Fumarate-FA (PRENATAL MULTIVITAMIN) TABS tablet Take 1 tablet by mouth daily at 12 noon.     zinc gluconate 50 MG tablet Take 50 mg by mouth daily.     No current facility-administered medications for this visit.    PHYSICAL EXAMINATION: ECOG PERFORMANCE STATUS: 0 - Asymptomatic  There were no vitals filed for this visit. Wt Readings from Last 3 Encounters:  07/10/21 147 lb (66.7 kg)  07/02/21 146 lb (66.2 kg)  06/27/21 150 lb (68 kg)     GENERAL:alert, no distress and comfortable SKIN: skin color, texture, turgor are normal, no rashes or significant lesions EYES: normal, Conjunctiva are  pink and non-injected, sclera clear  NECK: supple, thyroid normal size, non-tender, without nodularity LYMPH:  no palpable lymphadenopathy in the cervical, axillary  LUNGS: clear to auscultation and percussion with normal breathing effort HEART: regular rate & rhythm and no murmurs and no lower extremity edema ABDOMEN:abdomen soft, non-tender and normal bowel sounds Musculoskeletal:no cyanosis of digits and no clubbing  NEURO: alert & oriented x 3 with fluent speech, no focal motor/sensory deficits  LABORATORY DATA:  I have reviewed the data as listed CBC Latest Ref Rng & Units 07/11/2021 06/02/2021 06/01/2021  WBC 4.0 - 10.5 K/uL 5.0 4.0 5.2  Hemoglobin 12.0 - 15.0 g/dL 14.8 13.2 12.7  Hematocrit 36.0 - 46.0 % 44.4 39.5 38.6  Platelets 150 - 400 K/uL 110(L) 98(L) 87(L)     CMP Latest Ref Rng & Units 07/11/2021 06/02/2021 06/01/2021  Glucose 70 - 99 mg/dL 92 91 101(H)  BUN 6 - 20 mg/dL _0 Creatinine 0.44 - 1.00 mg/dL 0.46 0.59 0.61  Sodium 135 - 145 mmol/L 135 138 138  Potassium 3.5 - 5.1 mmol/L 4.0 3.9 3.8  Chloride 98 - 111 mmol/L 100 102 103  CO2 22 - 32 mmol/L _1 Calcium 8.9 - 10.3 mg/dL 9.2 9.0 8.5(L)  Total Protein 6.5 - 8.1 g/dL 6.8 - -  Total Bilirubin 0.3 - 1.2 mg/dL 0.7 - -  Alkaline Phos 38 - 126 U/L 69 - -  AST 15 - 41 U/L 19 - -  ALT 0 - 44 U/L 23 - -      RADIOGRAPHIC STUDIES: I have personally reviewed the radiological images as listed and agreed with the findings in the report. No results found.    No orders of the defined types were placed in this encounter.  All questions were answered. The patient knows to call the clinic with any problems, questions or concerns. No barriers to learning was detected. The total time spent in the appointment was 30 minutes.     [name], MD 08/02/2021   I, Wilburn Mylar, am acting as scribe for [name], MD.   I have reviewed the above documentation for accuracy and completeness, and I agree with the  above.

## 2021-08-03 ENCOUNTER — Encounter: Payer: Self-pay | Admitting: Hematology

## 2021-08-05 ENCOUNTER — Telehealth (INDEPENDENT_AMBULATORY_CARE_PROVIDER_SITE_OTHER): Payer: Medicaid Other | Admitting: Advanced Practice Midwife

## 2021-08-05 ENCOUNTER — Encounter: Payer: Self-pay | Admitting: Advanced Practice Midwife

## 2021-08-05 DIAGNOSIS — N390 Urinary tract infection, site not specified: Secondary | ICD-10-CM | POA: Diagnosis not present

## 2021-08-05 DIAGNOSIS — Z30011 Encounter for initial prescription of contraceptive pills: Secondary | ICD-10-CM

## 2021-08-05 DIAGNOSIS — Z3009 Encounter for other general counseling and advice on contraception: Secondary | ICD-10-CM | POA: Diagnosis not present

## 2021-08-05 MED ORDER — NITROFURANTOIN MONOHYD MACRO 100 MG PO CAPS: 100.0000 mg | ORAL_CAPSULE | ORAL | 10 refills | Status: DC | PRN

## 2021-08-05 MED ORDER — PHEXXI 1.8-1-0.4 % VA GEL: 5.0000 g | VAGINAL | 1 refills | Status: DC | PRN

## 2021-08-05 MED ORDER — NORETHIN ACE-ETH ESTRAD-FE 1-20 MG-MCG(24) PO TABS: 1.0000 | ORAL_TABLET | Freq: Every day | ORAL | 11 refills | Status: DC

## 2021-08-05 NOTE — Progress Notes (Signed)
GYNECOLOGY VIRTUAL VISIT ENCOUNTER NOTE  Provider location: [facility] [place] [facility]   Patient location: Home  I connected with Stephanie [name] on 08/05/21 at 11:15 AM EST by MyChart Video Encounter and verified that I am speaking with the correct person using two identifiers.   I discussed the limitations, risks, security and privacy concerns of performing an evaluation and management service virtually and the availability of in person appointments. I also discussed with the patient that there may be a patient responsible charge related to this service. The patient expressed understanding and agreed to proceed.   History:  Stephanie [name] is a 40 y.o. [id number] female being evaluated today for contraceptive counseling.  She had Paragard IUD in the past and had it taken out because she was having irregular cycles with spotting and was not sexually active at the time. She was treated for colon cancer in 2021 with chemotherapy and did not have menses for 6 months. Menses resumed and were initially irregular, but are now regular without in between spotting.  She is also recently sexually active again and desires pregnancy prevention.  She has hx recurrent UTI, triggered by intercourse, and recurrent BV, especially with the IUD and Nuvaring.  She denies any abnormal vaginal discharge, bleeding, pelvic pain or other concerns.       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[facility])    03-2020   Family history of bladder cancer    History of chemotherapy    ended 09-2020   Lactose intolerance 03/12/2020   Neuromuscular disorder (HCC)    neuropathy feet hands legs   UTI (lower urinary tract infection)    Past Surgical History:  Procedure Laterality Date   CESAREAN SECTION      CHOLECYSTECTOMY  12/13/2020   COLECTOMY  03/2020   COLONOSCOPY     COLOSTOMY TAKEDOWN N/A 05/30/2021   Procedure: LAPAROSCOPIC ASSISTED COLOSTOMY TAKEDOWN;  Surgeon: [name], MD;  Location: [place];  Service: General;  Laterality: N/A;   CYSTOSCOPY WITH STENT PLACEMENT  04/04/2020   Procedure: CYSTOSCOPY WITH STENT PLACEMENT;  Surgeon: [name], MD;  Location: [facility];  Service: General;;   LAPAROSCOPIC LIVER ULTRASOUND N/A 12/13/2020   Procedure: INTRAOPERATIVE LIVER ULTRASOUND;  Surgeon: [name], MD;  Location: [place];  Service: General;  Laterality: N/A;   LAPAROSCOPY N/A 12/13/2020   Procedure: LAPAROSCOPY DIAGNOSTIC;  Surgeon: [name], MD;  Location: [place];  Service: General;  Laterality: N/A;   LAPAROTOMY N/A 04/04/2020   Procedure: EXPLORATORY LAPAROTOMY;  Surgeon: [name], MD;  Location: [facility];  Service: General;  Laterality: N/A;   OPEN PARTIAL HEPATECTOMY  N/A 12/13/2020   Procedure: OPEN PARTIAL HEPATECTOMY;  Surgeon: [name], MD;  Location: [place];  Service: General;  Laterality: N/A;  ROOM 2 STARTING AT 09:30AM FOR 300 MIN   PORTACATH PLACEMENT Right 04/12/2020   Procedure: INSERTION PORT-A-CATH WITH ULTRASOUND;  Surgeon: Kieth Brightly Arta Bruce, MD;  Location: [facility];  Service: General;  Laterality: Right;   The following portions of the patient's history were reviewed and updated as appropriate: allergies, current medications, past family history, past medical history, past social history, past surgical history and problem list.   Health Maintenance:  Normal  pap and negative HRHPV on 02/05/2021.  Normal mammogram on 03/11/2021.   Review of Systems:  Pertinent items noted in HPI and remainder of comprehensive ROS otherwise negative.  Physical Exam:   General:  Alert, oriented and cooperative. Patient appears to be in no acute distress.  Mental Status: Normal mood and affect. Normal behavior. Normal judgment and thought content.   Respiratory: Normal  respiratory effort, no problems with respiration noted  Rest of physical exam deferred due to type of encounter  Labs and Imaging Results for orders placed or performed in visit on 08/02/21 (from the past 336 hour(s))  CBC with Differential ([place] Only)   Collection Time: 08/02/21  2:16 PM  Result Value Ref Range   WBC Count 4.7 4.0 - 10.5 K/uL   RBC 4.21 3.87 - 5.11 MIL/uL   Hemoglobin 12.9 12.0 - 15.0 g/dL   HCT 39.3 36.0 - 46.0 %   MCV 93.3 80.0 - 100.0 fL   MCH 30.6 26.0 - 34.0 pg   MCHC 32.8 30.0 - 36.0 g/dL   RDW 13.2 11.5 - 15.5 %   Platelet Count 111 (L) 150 - 400 K/uL   nRBC 0.0 0.0 - 0.2 %   Neutrophils Relative % 63 %   Neutro Abs 3.0 1.7 - 7.7 K/uL   Lymphocytes Relative 26 %   Lymphs Abs 1.2 0.7 - 4.0 K/uL   Monocytes Relative 10 %   Monocytes Absolute 0.5 0.1 - 1.0 K/uL   Eosinophils Relative 1 %   Eosinophils Absolute 0.1 0.0 - 0.5 K/uL   Basophils Relative 0 %   Basophils Absolute 0.0 0.0 - 0.1 K/uL   Immature Granulocytes 0 %   Abs Immature Granulocytes 0.02 0.00 - 0.07 K/uL  CMP (Cancer Center only)   Collection Time: 08/02/21  2:16 PM  Result Value Ref Range   Sodium 140 135 - 145 mmol/L   Potassium 4.2 3.5 - 5.1 mmol/L   Chloride 110 98 - 111 mmol/L   CO2 24 22 - 32 mmol/L   Glucose, Bld 97 70 - 99 mg/dL   BUN 15 6 - 20 mg/dL   Creatinine 0.59 0.44 - 1.00 mg/dL   Calcium 8.5 (L) 8.9 - 10.3 mg/dL   Total Protein 6.1 (L) 6.5 - 8.1 g/dL   Albumin 3.7 3.5 - 5.0 g/dL   AST 16 15 - 41 U/L   ALT 18 0 - 44 U/L   Alkaline Phosphatase 78 38 - 126 U/L   Total Bilirubin 0.3 0.3 - 1.2 mg/dL   GFR, Estimated >60 >60 mL/min   Anion gap 6 5 - 15  CEA (IN HOUSE-[facility])FOR [facility] ONLY   Collection Time: 08/02/21  2:16 PM  Result Value Ref Range   CEA ([facility]-In House) 1.84 0.00 - 5.00 ng/mL   DG Chest 2 View  Result Date: 07/11/2021 CLINICAL DATA:  Shortness of breath and chest pressure. EXAM: CHEST - 2 VIEW COMPARISON:  December 30, 2020 FINDINGS: There  is stable right-sided venous Port-A-Cath positioning. The heart size and mediastinal contours are within normal limits. Both lungs are clear. Radiopaque surgical clips are seen within the right upper quadrant. The visualized skeletal structures are unremarkable. IMPRESSION: No active cardiopulmonary disease. Electronically Signed   By: Virgina Norfolk M.D.   On: 07/11/2021 16:01   CT Angio Chest PE W and/or Wo Contrast  Result Date: 07/11/2021 CLINICAL DATA:  Intermittent shortness of breath for several weeks, history of colon cancer EXAM: CT ANGIOGRAPHY CHEST WITH  CONTRAST TECHNIQUE: Multidetector CT imaging of the chest was performed using the standard protocol during bolus administration of intravenous contrast. Multiplanar CT image reconstructions and MIPs were obtained to evaluate the vascular anatomy. CONTRAST:  29mL OMNIPAQUE IOHEXOL 350 MG/ML SOLN COMPARISON:  07/11/2021, 11/12/2020 FINDINGS: Cardiovascular: This is a technically adequate evaluation of the pulmonary vasculature. No filling defects or pulmonary emboli. The heart and great vessels are unremarkable without pericardial effusion. Mediastinum/Nodes: No enlarged mediastinal, hilar, or axillary lymph nodes. Thyroid gland, trachea, and esophagus demonstrate no significant findings. Stable right chest wall port, tip within the superior vena cava. Lungs/Pleura: No acute airspace disease, effusion, or pneumothorax. Central airways are patent. Upper Abdomen: Postsurgical changes from partial right lobe liver resection. No acute upper abdominal findings. Musculoskeletal: No acute or destructive bony lesions. Reconstructed images demonstrate no additional findings. Review of the MIP images confirms the above findings. IMPRESSION: 1. No evidence of pulmonary embolus. 2. No acute intrathoracic metastases. 3. Stable postsurgical changes right lobe liver. Electronically Signed   By: Randa Ngo M.D.   On: 07/11/2021 22:01   CT ABDOMEN PELVIS W  CONTRAST  Result Date: 07/11/2021 CLINICAL DATA:  Abdominal distention.  History of colon cancer. EXAM: CT ABDOMEN AND PELVIS WITH CONTRAST TECHNIQUE: Multidetector CT imaging of the abdomen and pelvis was performed using the standard protocol following bolus administration of intravenous contrast. CONTRAST:  25mL OMNIPAQUE IOHEXOL 350 MG/ML SOLN COMPARISON:  05/05/2021 FINDINGS: Lower chest: Lung bases are clear. Hepatobiliary: Postoperative changes in segments 6 and 7 of the liver with focal defect and surgical clips consistent with previous resection. No residual mass lesion is identified. No other focal liver lesions. Portal veins are patent. Pancreas: Unremarkable. No pancreatic ductal dilatation or surrounding inflammatory changes. Spleen: Normal in size without focal abnormality. Adrenals/Urinary Tract: No adrenal gland nodules. Kidneys are symmetrical. Nephrograms are homogeneous. No hydronephrosis or hydroureter. Bladder is unremarkable. Stomach/Bowel: Stomach, small bowel, and colon are not abnormally distended. Diffusely stool-filled colon. Since the previous study, there has been a takedown of the descending colostomy with surgical anastomosis at the rectosigmoid junction. No definite evidence of any residual or recurrent mass. Appendix is normal. Vascular/Lymphatic: No significant vascular findings are present. No enlarged abdominal or pelvic lymph nodes. Reproductive: Uterus is not enlarged. Enhancing myometrial nodule anteriorly suggesting a fibroid. Cystic structure in the right pelvis likely represents a right ovarian cyst, measuring 3.8 cm diameter. This is enlarging since the previous study. Prominent pelvic vein varices. Other: No free air or free fluid in the abdomen. Small periumbilical hernia containing a portion of the colon without full thickness involvement. No proximal obstruction. Musculoskeletal: No acute or significant osseous findings. IMPRESSION: 1. No evidence of bowel obstruction  or inflammation. Interval take down of previous left lower troche mint colostomy with sigmoid anastomosis. Partial herniation of the transverse colon at the umbilicus without proximal obstruction. Diffusely stool-filled colon. 2. Postoperative resection at the right lobe of the liver. No residual or recurrent liver lesions are demonstrated. 3. Uterine fibroid. 4. 3.8 cm diameter right ovarian cyst. No follow-up imaging recommended. Note: This recommendation does not apply to premenarchal patients and to those with increased risk (genetic, family history, elevated tumor markers or other high-risk factors) of ovarian cancer. Reference: JACR 2020 Feb; 17(2):248-254 Electronically Signed   By: Lucienne Capers M.D.   On: 07/11/2021 22:05       Assessment and Plan:     1. Encounter for counseling regarding contraception --Discussed pt contraceptive plans and reviewed contraceptive methods based  on pt preferences and effectiveness.  Pt prefers to avoid vaginal options with hx frequent BV.  Nuvaring worked well but caused BV. --Pt takes daily vitamins on schedule since her cancer treatment so is confident that the daily dosing will be manageable. --Rx for low dose OCPs. Pt nonsmoker, had colon cancer last year, treated and cancer free currently, no other risk factors.    --Pt worried about risks before OCPs are working. Rx for Phexxi to use until 2 weeks of OCPs.  - Norethindrone Acetate-Ethinyl Estrad-FE (LOESTRIN 24 FE) 1-20 MG-MCG(24) tablet; Take 1 tablet by mouth daily.  Dispense: 28 tablet; Refill: 11 - Lactic Ac-Citric Ac-Pot Bitart (PHEXXI) 1.8-1-0.4 % GEL; Place 5 g vaginally as needed.  Dispense: 60 g; Refill: 1  --F/U in May 2023 for annual exam, Pap due 2027  2. Recurrent UTI --Pt recently became sexually active again, has hx of UTI every time she has intercourse. Will try postcoital prophylaxis and consider continuous prophylaxis PRN.  - nitrofurantoin, macrocrystal-monohydrate, (MACROBID)  100 MG capsule; Take 1 capsule (100 mg total) by mouth as needed. Take within 24 hours after each act of intercourse to prevent UTI  Dispense: 20 capsule; Refill: 10       I discussed the assessment and treatment plan with the patient. The patient was provided an opportunity to ask questions and all were answered. The patient agreed with the plan and demonstrated an understanding of the instructions.   The patient was advised to call back or seek an in-person evaluation/go to the ED if the symptoms worsen or if the condition fails to improve as anticipated.  I provided 10 minutes of face-to-face time during this encounter.   Fatima Blank, [place] for [employer], [place]

## 2021-08-05 NOTE — Progress Notes (Signed)
Virtual Visit via Telephone Note  I connected with Stephanie [name] on 08/05/21 at 11:15 AM EST by telephone and verified that I am speaking with the correct person using two identifiers.  Pt requests to start Nuva Ring.

## 2021-08-21 ENCOUNTER — Telehealth: Payer: Self-pay | Admitting: *Deleted

## 2021-08-21 NOTE — Telephone Encounter (Signed)
Pt called to office stating she is having breakthrough bleeding with new OCP. Pt states a couple heavy days but has been light since then.  Pt states that she is almost at end of pack. Pt advised that she may continue new pack as a continuous active pills. Pt made aware this may help bleeding that she is having. Pt also made aware this may change when her cycle is expected.    Pt advised to contact office if no change in bleeding.   Pt states understanding.

## 2021-08-22 ENCOUNTER — Emergency Department (HOSPITAL_COMMUNITY): Payer: Medicaid Other

## 2021-08-22 ENCOUNTER — Emergency Department (HOSPITAL_COMMUNITY)
Admission: EM | Admit: 2021-08-22 | Discharge: 2021-08-22 | Disposition: A | Discharge status: Home / Self Care | Payer: Medicaid Other | Attending: Emergency Medicine | Admitting: Emergency Medicine

## 2021-08-22 ENCOUNTER — Encounter (HOSPITAL_COMMUNITY): Payer: Self-pay | Admitting: Emergency Medicine

## 2021-08-22 DIAGNOSIS — Z85048 Personal history of other malignant neoplasm of rectum, rectosigmoid junction, and anus: Secondary | ICD-10-CM | POA: Insufficient documentation

## 2021-08-22 DIAGNOSIS — K625 Hemorrhage of anus and rectum: Secondary | ICD-10-CM | POA: Diagnosis not present

## 2021-08-22 DIAGNOSIS — Z87891 Personal history of nicotine dependence: Secondary | ICD-10-CM | POA: Diagnosis not present

## 2021-08-22 DIAGNOSIS — Z85038 Personal history of other malignant neoplasm of large intestine: Secondary | ICD-10-CM | POA: Insufficient documentation

## 2021-08-22 DIAGNOSIS — N9489 Other specified conditions associated with female genital organs and menstrual cycle: Secondary | ICD-10-CM | POA: Diagnosis not present

## 2021-08-22 DIAGNOSIS — Z8505 Personal history of malignant neoplasm of liver: Secondary | ICD-10-CM | POA: Insufficient documentation

## 2021-08-22 LAB — COMPREHENSIVE METABOLIC PANEL
ALT: 15 U/L (ref 0–44)
AST: 17 U/L (ref 15–41)
Albumin: 3.7 g/dL (ref 3.5–5.0)
Alkaline Phosphatase: 44 U/L (ref 38–126)
Anion gap: 6 (ref 5–15)
BUN: 13 mg/dL (ref 6–20)
CO2: 25 mmol/L (ref 22–32)
Calcium: 8.8 mg/dL — ABNORMAL LOW (ref 8.9–10.3)
Chloride: 108 mmol/L (ref 98–111)
Creatinine, Ser: 0.56 mg/dL (ref 0.44–1.00)
GFR, Estimated: >60 mL/min (ref >60)
Glucose, Bld: 85 mg/dL (ref 70–99)
Potassium: 4 mmol/L (ref 3.5–5.1)
Sodium: 139 mmol/L (ref 135–145)
Total Bilirubin: 0.6 mg/dL (ref 0.3–1.2)
Total Protein: 6.3 g/dL — ABNORMAL LOW (ref 6.5–8.1)

## 2021-08-22 LAB — CBC WITH DIFFERENTIAL/PLATELET
Abs Immature Granulocytes: 0 10*3/uL (ref 0.00–0.07)
Basophils Absolute: 0 10*3/uL (ref 0.0–0.1)
Basophils Relative: 0 %
Eosinophils Absolute: 0.1 10*3/uL (ref 0.0–0.5)
Eosinophils Relative: 2 %
HCT: 39.9 % (ref 36.0–46.0)
Hemoglobin: 13.5 g/dL (ref 12.0–15.0)
Immature Granulocytes: 0 %
Lymphocytes Relative: 31 %
Lymphs Abs: 1.1 10*3/uL (ref 0.7–4.0)
MCH: 31.5 pg (ref 26.0–34.0)
MCHC: 33.8 g/dL (ref 30.0–36.0)
MCV: 93 fL (ref 80.0–100.0)
Monocytes Absolute: 0.4 10*3/uL (ref 0.1–1.0)
Monocytes Relative: 10 %
Neutro Abs: 2.1 10*3/uL (ref 1.7–7.7)
Neutrophils Relative %: 57 %
Platelets: 96 10*3/uL — ABNORMAL LOW (ref 150–400)
RBC: 4.29 MIL/uL (ref 3.87–5.11)
RDW: 13.8 % (ref 11.5–15.5)
WBC: 3.6 10*3/uL — ABNORMAL LOW (ref 4.0–10.5)
nRBC: 0 % (ref 0.0–0.2)

## 2021-08-22 LAB — I-STAT BETA HCG BLOOD, ED (MC, WL, AP ONLY): I-stat hCG, quantitative: <5 m[IU]/mL (ref <5)

## 2021-08-22 LAB — POC OCCULT BLOOD, ED: Fecal Occult Bld: NEGATIVE

## 2021-08-22 LAB — LIPASE, BLOOD: Lipase: 36 U/L (ref 11–51)

## 2021-08-22 IMAGING — CT CT ABD-PELV W/ CM
2 of 4 series · 16 of 46 positions shown, 18 images · IV contrast (omnipaque)
Comparison: [DATE]

CLINICAL DATA: Right rectal bleeding last night. Assess for
diverticulitis. History of colon cancer.

EXAM:
CT ABDOMEN AND PELVIS WITH CONTRAST
TECHNIQUE: Multidetector CT imaging of the abdomen and pelvis was performed
using the standard protocol following bolus administration of
intravenous contrast.
CONTRAST:  80mL OMNIPAQUE IOHEXOL 350 MG/ML SOLN

[Series 3: axial st · axial · 0.82mm/px · z∈[+841,+1291]mm · 13 of 98 slices shown, 15 images]
[im 4/98  soft-tissue]
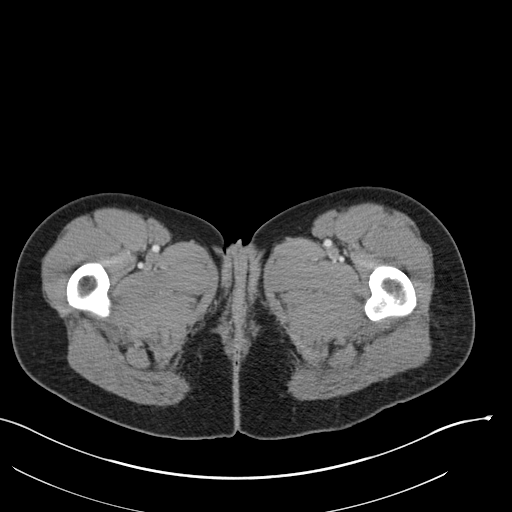
[im 4/98  bone]
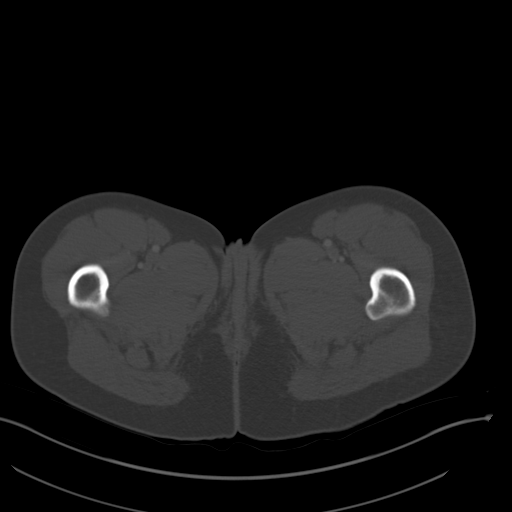
[im 12/98  soft-tissue]
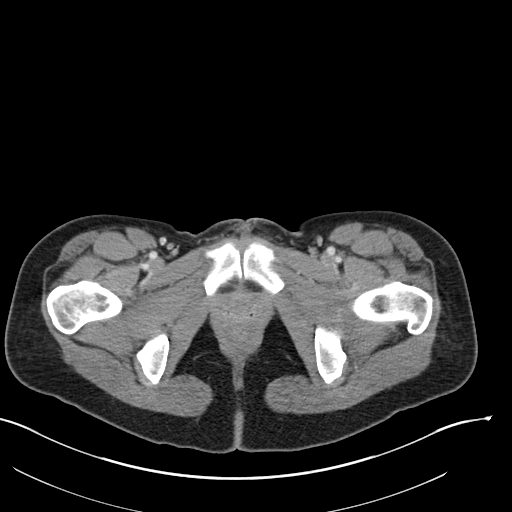
[im 20/98  soft-tissue]
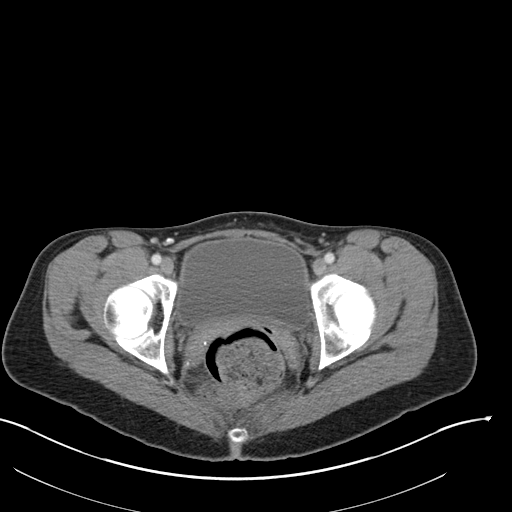
[im 28/98  soft-tissue]
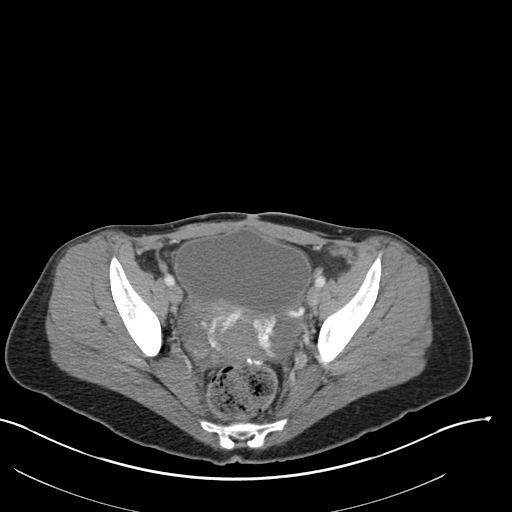
[im 35/98  soft-tissue]
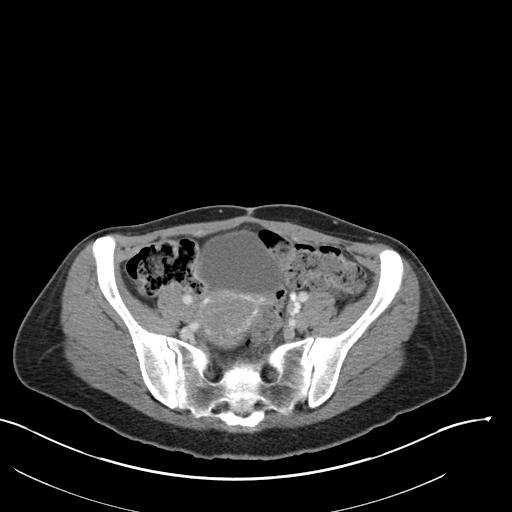
[im 43/98  soft-tissue]
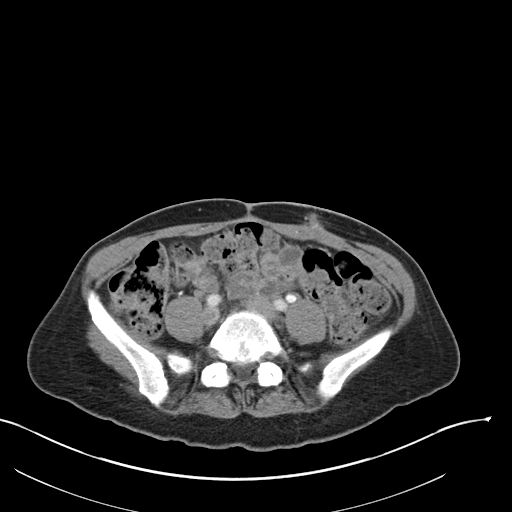
[im 51/98  soft-tissue]
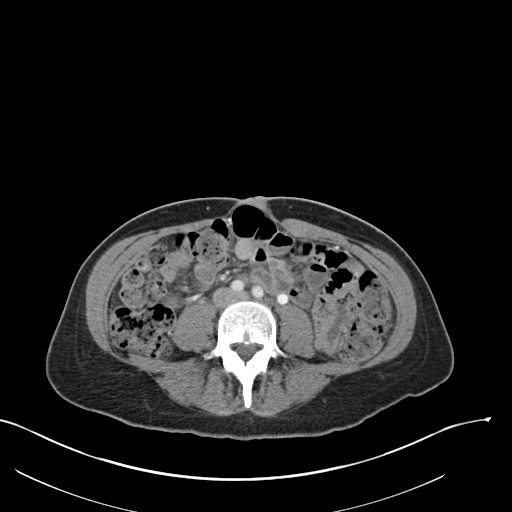
[im 55/98  soft-tissue]
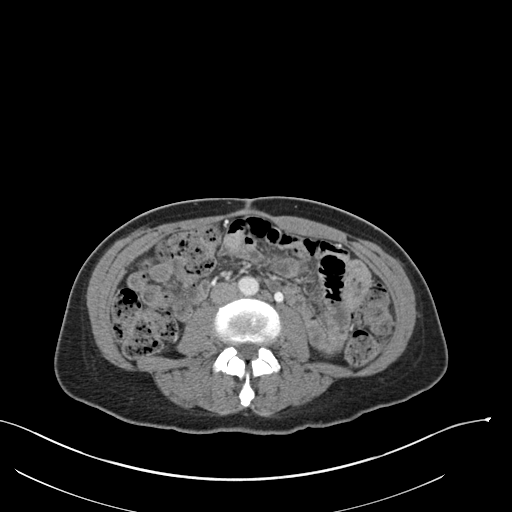
[im 63/98  soft-tissue]
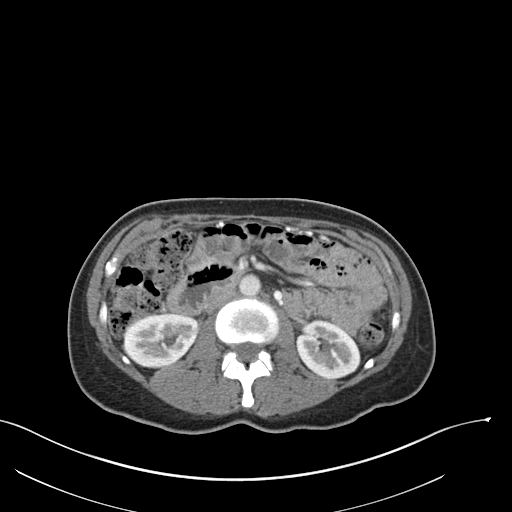
[im 63/98  bone]
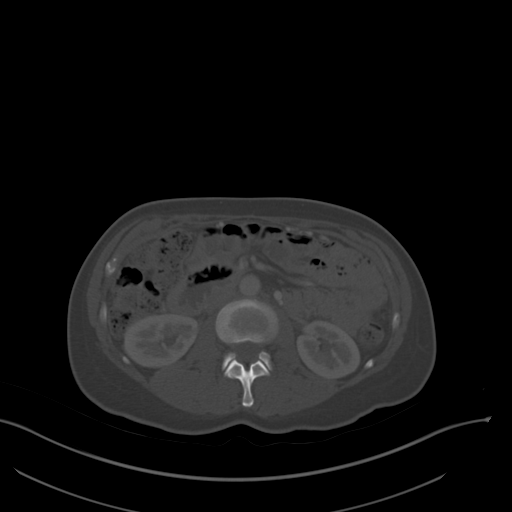
[im 70/98  soft-tissue]
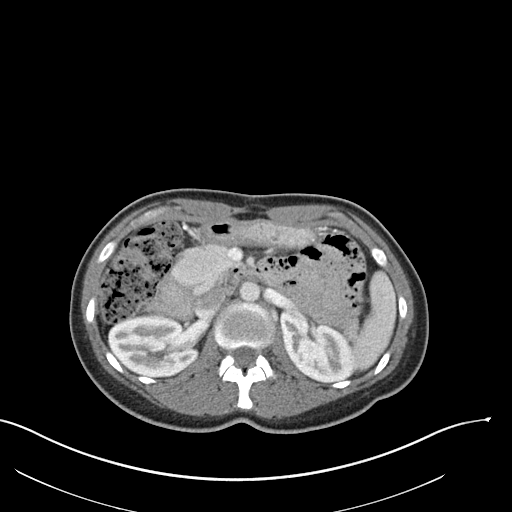
[im 78/98  soft-tissue]
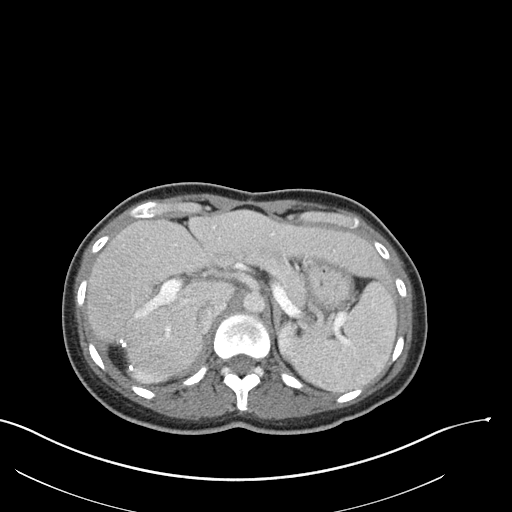
[im 86/98  soft-tissue]
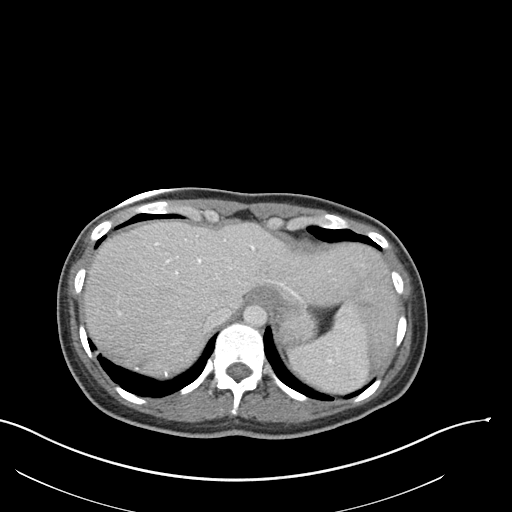
[im 94/98  soft-tissue]
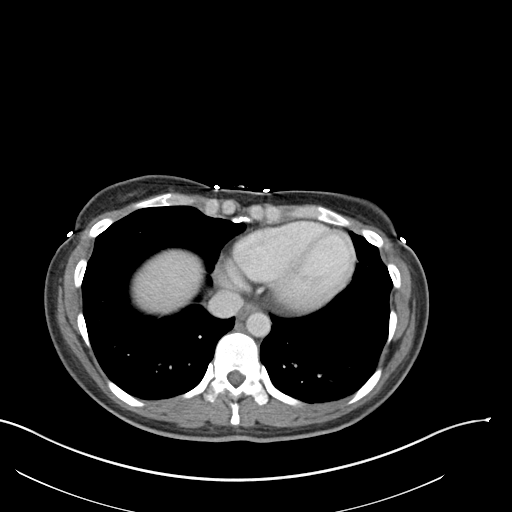

[Series 5: coronal st · coronal · 0.85mm/px · 3 of 133 slices shown]
[im 45/133  soft-tissue]
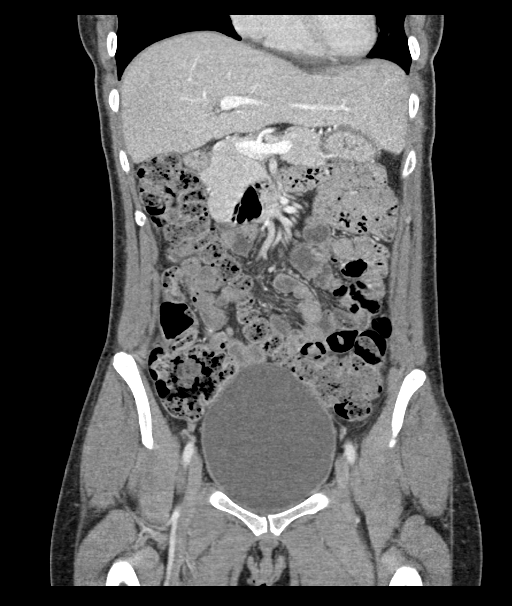
[im 59/133  soft-tissue]
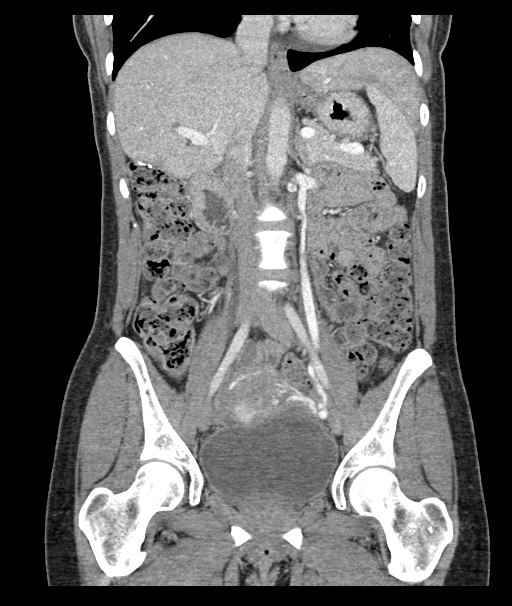
[im 74/133  soft-tissue]
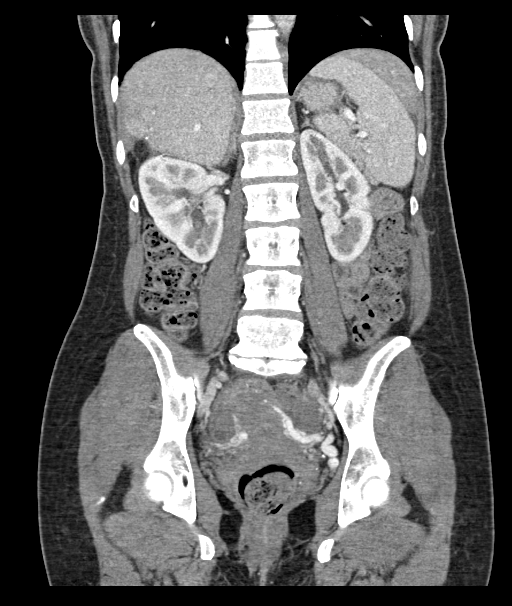

[16 of 46 positions shown; findings below may reference images not displayed]

FINDINGS: Lower chest: No acute abnormality.

Hepatobiliary: Stable postoperative changes are identified in the
right lobe liver unchanged compared prior exam. No mass lesion is
identified liver. The biliary tree is stable.

Pancreas: Unremarkable. No pancreatic ductal dilatation or
surrounding inflammatory changes.

Spleen: Normal in size without focal abnormality.

Adrenals/Urinary Tract: Adrenal glands are unremarkable. Kidneys are
normal, without renal calculi, focal lesion, or hydronephrosis.
Bladder is unremarkable.

Stomach/Bowel: The small bowel is normal. There is no small bowel
obstruction. The stomach is. Extensive bowel content is identified
throughout the colon consistent with constipation. Findings of
descending colostomy with anastomosis at the rectosigmoid junction
is unchanged.

Vascular/Lymphatic: No significant vascular findings are present. No
enlarged abdominal or pelvic lymph nodes.

Reproductive: There is probably uterine fibroid. No abnormal masses
are identified in bilateral adnexa.

Other: Small umbilical hernia containing a portion of the colon is
unchanged. No evidence of obstruction.

Musculoskeletal: Mild degenerative joint changes of lower lumbar
spine noted.
IMPRESSION: 1. Extensive bowel content is identified throughout the colon
consistent with constipation.
2. Findings of descending colostomy with anastomosis at the
rectosigmoid junction is unchanged.
3. Stable postoperative changes in the right lobe liver.

## 2021-08-22 MED ORDER — IOHEXOL 350 MG/ML SOLN
80.0000 mL | Freq: Once | INTRAVENOUS | Status: AC | PRN
  Administered 2021-08-22: 80 mL via INTRAVENOUS

## 2021-08-22 MED ORDER — LACTATED RINGERS IV BOLUS
1000.0000 mL | Freq: Once | INTRAVENOUS | Status: AC
  Administered 2021-08-22: 1000 mL via INTRAVENOUS

## 2021-08-22 MED ORDER — KETOROLAC TROMETHAMINE 15 MG/ML IJ SOLN
15.0000 mg | Freq: Once | INTRAMUSCULAR | Status: AC
  Administered 2021-08-22: 15 mg via INTRAVENOUS
  Filled 2021-08-22: qty 1

## 2021-08-22 NOTE — ED Triage Notes (Signed)
Patient here from home reporting bright red rectal bleeding last night and this morning when wiping. Reports hx of colon cancer and anal fissure.

## 2021-08-22 NOTE — ED Provider Notes (Signed)
[facility] DEPT Provider Note   CSN: [id number] Arrival date & time: 08/22/21  1696     History Chief Complaint  Patient presents with   Rectal Bleeding    Stephanie [name] is a 40 y.o. female.   Rectal Bleeding Associated symptoms: no abdominal pain, no dizziness, no fever, no light-headedness and no vomiting   Patient presents for rectal bleeding.  Onset was last night.  This morning she noticed additional blood when wiping.  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  She underwent liver resection for metastatic lesions.  Last chemotherapy  was 10 months ago.  She underwent colostomy takedown in September.  She has had anal fissure in the past.  She has noted recent constipation and straining with bowel movements.  She is no longer taking fiber supplementation.  She is currently towards the end of her menstrual period.  She has had typical pelvic cramping.  She has not had any other abdominal pain.  She has not had any nausea, vomiting, fevers, or chills.  She is worried of cancer recurrence.    Past Medical History:  Diagnosis Date   Anxiety    Blood transfusion without reported diagnosis    Bowel obstruction (HCC)    BV (bacterial vaginosis)    Colon cancer ([place])    Colon Cancer 03-2020   Colon polyps    Colostomy present [facility])    03-2020   Family history of bladder cancer    History of chemotherapy    ended 09-2020   Lactose intolerance 03/12/2020   Neuromuscular disorder (HCC)    neuropathy feet hands legs   UTI (lower urinary tract infection)     Patient Active Problem List   Diagnosis Date Noted   Oral contraception initiation 08/05/2021   Rheumatic fever without heart involvement 05/30/2021   Colorectal carcinoma ([place]) 03/08/2021   Genetic testing 01/28/2021   Abdominal fluid collection 12/31/2020   Metastatic colon cancer to liver ([place]) 12/13/2020   Family  history of bladder cancer    Port-A-Cath in place 05/03/2020   Sepsis ([place]) 04/04/2020   SOB (shortness of breath) 04/04/2020   Moderate protein-calorie malnutrition (HCC) 04/04/2020   Nausea & vomiting 04/04/2020   TOA (tubo-ovarian abscess)    Intra-abdominal abscess ([place]) 03/31/2020   Lactose intolerance 03/12/2020   Microcytic anemia 03/12/2020   Malignant neoplasm of rectosigmoid junction ([place]) 03/12/2020   Peritonitis with abscess of intestine ([place]) 03/12/2020   Recurrent urinary tract infection 06/01/2012   Genital herpes simplex 02/09/2012   Attention deficit disorder 02/27/2010   Anxiety state 02/27/2010    Past Surgical History:  Procedure Laterality Date   CESAREAN SECTION     CHOLECYSTECTOMY  12/13/2020   COLECTOMY  03/2020   COLONOSCOPY     COLOSTOMY TAKEDOWN N/A 05/30/2021   Procedure: LAPAROSCOPIC ASSISTED COLOSTOMY TAKEDOWN;  Surgeon: [name], MD;  Location: [facility];  Service: General;  Laterality: N/A;   CYSTOSCOPY WITH STENT PLACEMENT  04/04/2020   Procedure: CYSTOSCOPY WITH STENT PLACEMENT;  Surgeon: [name], MD;  Location: [facility];  Service: General;;   LAPAROSCOPIC LIVER ULTRASOUND N/A 12/13/2020   Procedure: INTRAOPERATIVE LIVER ULTRASOUND;  Surgeon: [name], MD;  Location: [facility];  Service: General;  Laterality: N/A;   LAPAROSCOPY N/A 12/13/2020   Procedure: LAPAROSCOPY DIAGNOSTIC;  Surgeon: [name], MD;  Location: [facility];  Service: General;  Laterality: N/A;   LAPAROTOMY N/A 04/04/2020   Procedure:  EXPLORATORY LAPAROTOMY;  Surgeon: [name], MD;  Location: [facility];  Service: General;  Laterality: N/A;   OPEN PARTIAL HEPATECTOMY  N/A 12/13/2020   Procedure: OPEN PARTIAL HEPATECTOMY;  Surgeon: [name], MD;  Location: [facility];  Service: General;  Laterality: N/A;  ROOM 2 STARTING AT 09:30AM FOR 300 MIN   PORTACATH PLACEMENT Right 04/12/2020   Procedure: INSERTION PORT-A-CATH WITH ULTRASOUND;  Surgeon: Mickeal Skinner, MD;   Location: [facility];  Service: General;  Laterality: Right;     OB History     Gravida  3   Para  1   Term  1   Preterm      AB  1   Living  1      SAB      IAB  1   Ectopic      Multiple      Live Births              Family History  Problem Relation Age of Onset   Irritable bowel syndrome Mother    Heart disease Father    Stroke Father    Aneurysm Father    Irritable bowel syndrome Brother    Other Brother        gluten intolerance   Bladder Cancer Maternal Grandmother        dx 90s   Colon cancer Neg Hx    Esophageal cancer Neg Hx    Colon polyps Neg Hx    Stomach cancer Neg Hx    Rectal cancer Neg Hx     Social History   Tobacco Use   Smoking status: Former    Packs/day: 0.50    Years: 10.00    Pack years: 5.00    Types: Cigarettes    Quit date: 2018    Years since quitting: 4.8   Smokeless tobacco: Never  Vaping Use   Vaping Use: Never used  Substance Use Topics   Alcohol use: Not Currently    Comment: seldom   Drug use: No    Home Medications Prior to Admission medications   Medication Sig Start Date End Date Taking? Authorizing Provider  ibuprofen (ADVIL) 200 MG tablet Take 400 mg by mouth every 6 (six) hours as needed for headache, fever or mild pain.   Yes [provider]  Norethindrone Acetate-Ethinyl Estrad-FE (LOESTRIN 24 FE) 1-20 MG-MCG(24) tablet Take 1 tablet by mouth daily. 08/05/21  Yes Leftwich-Kirby, Kathie Dike, CNM  Prenatal Vit-Fe Fumarate-FA (PRENATAL MULTIVITAMIN) TABS tablet Take 1 tablet by mouth daily at 12 noon.   Yes [provider]  acetaminophen (TYLENOL) 325 MG tablet Take 2 tablets (650 mg total) by mouth every 6 (six) hours as needed for mild pain, moderate pain, fever or headache (fever > 101). Patient not taking: Reported on 08/05/2021 05/31/21   [name], MD  cephALEXin (KEFLEX) 500 MG capsule Take 1 capsule (500 mg total) by mouth 3 (three) times daily. Patient not taking: Reported on  08/05/2021 07/11/21   Drenda Freeze, MD  clonazePAM (KLONOPIN) 0.5 MG tablet Take 1 tablet (0.5 mg total) by mouth daily as needed for anxiety. Patient not taking: Reported on 08/05/2021 08/02/21   [name], MD  Lactic Ac-Citric Ac-Pot Bitart (PHEXXI) 1.8-1-0.4 % GEL Place 5 g vaginally as needed. Patient not taking: Reported on 08/22/2021 08/05/21   Elvera Maria, CNM  meloxicam (MOBIC) 7.5 MG tablet Take 1 tablet (7.5 mg total) by mouth daily. Patient not taking: Reported on 08/22/2021  04/28/21   Montine Circle, PA-C  methocarbamol (ROBAXIN) 500 MG tablet Take 1 tablet (500 mg total) by mouth every 8 (eight) hours as needed for muscle spasms. Patient not taking: Reported on 07/10/2021 05/31/21   [name], MD  nitrofurantoin, macrocrystal-monohydrate, (MACROBID) 100 MG capsule Take 1 capsule (100 mg total) by mouth as needed. Take within 24 hours after each act of intercourse to prevent UTI Patient not taking: Reported on 08/22/2021 08/05/21   Elvera Maria, CNM    Allergies    Oxaliplatin  Review of Systems   Review of Systems  Constitutional:  Negative for activity change, appetite change, chills, fatigue and fever.  HENT:  Negative for congestion, ear pain and sore throat.   Eyes:  Negative for pain and visual disturbance.  Respiratory:  Negative for cough, chest tightness and shortness of breath.   Cardiovascular:  Negative for chest pain and palpitations.  Gastrointestinal:  Positive for anal bleeding, constipation and hematochezia. Negative for abdominal pain, diarrhea, nausea and vomiting.  Genitourinary:  Negative for dysuria, hematuria and menstrual problem.  Musculoskeletal:  Negative for arthralgias, back pain, joint swelling, myalgias and neck pain.  Skin:  Negative for color change and rash.  Neurological:  Negative for dizziness, seizures, syncope, light-headedness and headaches.  Hematological:  Does not bruise/bleed easily.  All other systems  reviewed and are negative.  Physical Exam Updated Vital Signs BP 108/69   Pulse (!) 58   Temp 98.3 F (36.8 C) (Oral)   Resp 17   Ht 5\' 8"  (1.727 m)   Wt 77.1 kg   LMP 07/29/2021   SpO2 99%   BMI 25.85 kg/m   Physical Exam Vitals and nursing note reviewed. Exam conducted with a chaperone present.  Constitutional:      General: She is not in acute distress.    Appearance: Normal appearance. She is well-developed and normal weight. She is not ill-appearing, toxic-appearing or diaphoretic.  HENT:     Head: Normocephalic and atraumatic.     Right Ear: External ear normal.     Left Ear: External ear normal.     Nose: Nose normal.  Eyes:     General: No scleral icterus.    Extraocular Movements: Extraocular movements intact.     Conjunctiva/sclera: Conjunctivae normal.  Cardiovascular:     Rate and Rhythm: Normal rate and regular rhythm.     Heart sounds: No murmur heard. Pulmonary:     Effort: Pulmonary effort is normal. No respiratory distress.     Breath sounds: Normal breath sounds.  Abdominal:     General: Abdomen is flat.     Palpations: Abdomen is soft.     Tenderness: There is no abdominal tenderness.  Genitourinary:    Rectum: Normal. Guaiac result negative. No mass, tenderness, anal fissure, external hemorrhoid or internal hemorrhoid. Normal anal tone.  Musculoskeletal:        General: No swelling.     Cervical back: Normal range of motion and neck supple.     Right lower leg: No edema.     Left lower leg: No edema.  Skin:    General: Skin is warm and dry.     Capillary Refill: Capillary refill takes less than 2 seconds.     Coloration: Skin is not jaundiced or pale.  Neurological:     General: No focal deficit present.     Mental Status: She is alert and oriented to person, place, and time.     Cranial Nerves: No  cranial nerve deficit.     Sensory: No sensory deficit.     Motor: No weakness.  Psychiatric:        Mood and Affect: Mood is anxious.         Behavior: Behavior normal.        Thought Content: Thought content normal.        Judgment: Judgment normal.    ED Results / Procedures / Treatments   Labs (all labs ordered are listed, but only abnormal results are displayed) Labs Reviewed  COMPREHENSIVE METABOLIC PANEL - Abnormal; Notable for the following components:      Result Value   Calcium 8.8 (*)    Total Protein 6.3 (*)    All other components within normal limits  CBC WITH DIFFERENTIAL/PLATELET - Abnormal; Notable for the following components:   WBC 3.6 (*)    Platelets 96 (*)    All other components within normal limits  LIPASE, BLOOD  URINALYSIS, ROUTINE W REFLEX MICROSCOPIC  POC OCCULT BLOOD, ED  I-STAT BETA HCG BLOOD, ED ([facility], [facility], [facility] ONLY)    EKG None  Radiology CT ABDOMEN PELVIS W CONTRAST  Result Date: 08/22/2021 CLINICAL DATA:  Right rectal bleeding last night. Assess for diverticulitis. History of colon cancer. EXAM: CT ABDOMEN AND PELVIS WITH CONTRAST TECHNIQUE: Multidetector CT imaging of the abdomen and pelvis was performed using the standard protocol following bolus administration of intravenous contrast. CONTRAST:  27mL OMNIPAQUE IOHEXOL 350 MG/ML SOLN COMPARISON:  July 11, 2021 FINDINGS: Lower chest: No acute abnormality. Hepatobiliary: Stable postoperative changes are identified in the right lobe liver unchanged compared prior exam. No mass lesion is identified liver. The biliary tree is stable. Pancreas: Unremarkable. No pancreatic ductal dilatation or surrounding inflammatory changes. Spleen: Normal in size without focal abnormality. Adrenals/Urinary Tract: Adrenal glands are unremarkable. Kidneys are normal, without renal calculi, focal lesion, or hydronephrosis. Bladder is unremarkable. Stomach/Bowel: The small bowel is normal. There is no small bowel obstruction. The stomach is. Extensive bowel content is identified throughout the colon consistent with constipation. Findings of descending colostomy with  anastomosis at the rectosigmoid junction is unchanged. Vascular/Lymphatic: No significant vascular findings are present. No enlarged abdominal or pelvic lymph nodes. Reproductive: There is probably uterine fibroid. No abnormal masses are identified in bilateral adnexa. Other: Small umbilical hernia containing a portion of the colon is unchanged. No evidence of obstruction. Musculoskeletal: Mild degenerative joint changes of lower lumbar spine noted. IMPRESSION: 1. Extensive bowel content is identified throughout the colon consistent with constipation. 2. 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 3. Stable postoperative changes in the right lobe liver. Electronically Signed   By: Abelardo Diesel M.D.   On: 08/22/2021 11:20    Procedures Procedures   Medications Ordered in ED Medications  ketorolac (TORADOL) 15 MG/ML injection 15 mg (15 mg Intravenous Given 08/22/21 1026)  lactated ringers bolus 1,000 mL (0 mLs Intravenous Stopped 08/22/21 1214)  iohexol (OMNIPAQUE) 350 MG/ML injection 80 mL (80 mLs Intravenous Contrast Given 08/22/21 1054)    ED Course  I have reviewed the triage vital signs and the nursing notes.  Pertinent labs & imaging results that were available during my care of the patient were reviewed by me and considered in my medical decision making (see chart for details).    MDM Rules/Calculators/A&P                          Patient presents for  episode of rectal bleeding last night and this morning.  Volume is described as scant.  She has history of colon cancer and is concerned of recurrence.  On arrival, she is well-appearing.  Visual inspection of rectal area and DRE performed with chaperone present.  Patient has no evidence of fissures or external hemorrhoids.  No internal hemorrhoids or masses were identified on DRE.  Patient stools guaiac negative.  Laboratory work-up was reassuring.  CT of abdomen and pelvis showed no acute  findings other than constipation.  Patient was advised to resume fiber therapy and drink plenty of water.  Contact information for GI follow-up was given, to be called as needed for colonoscopy.  Patient was discharged in good condition.  Final Clinical Impression(s) / ED Diagnoses Final diagnoses:  Rectal bleeding    Rx / DC Orders ED Discharge Orders     None        Godfrey Pick, MD 08/22/21 1239

## 2021-08-23 ENCOUNTER — Telehealth: Payer: Self-pay

## 2021-08-23 NOTE — Telephone Encounter (Signed)
Pt called stated she was instructed by the ED to follow-up with Dr. Ernestina Penna office regarding her recent visit to the ED for blood in her stools.  Pt stated she had blood in her stool Wednesday 11/23 and 11/24; therefore, went to the ED for further evaluation.  While in the ED, a CT Scan was done which came back negative but did show constipation.  Pt stated she had stopped taking her fiber supplements.  Pt stated the blood in her stools is bright red in color.  The ED physician did not see signs of a fissure or hemorrhoids.  Pt's last colonoscopy was done in May or June 2022 per pt and 1 polyp was removed.  Pt stated she took some Miralax last night and had a bowel movement yesterday but had a scant amount of bright red blood on the toilet paper.  Pt denied having a bowel movement today 08/23/2021.  Recommended the pt to start taking Colace and increase her fiber intake to keep her bowel movements soft and regular.  Also, encouraged pt to increase her fluid intake.  Pt verbalized understanding of instruction.  ED recommended that the pt follows up with Dr. Burr Medico.  Notified Dr. Burr Medico of the pt's call for a follow-up appt.

## 2021-08-27 ENCOUNTER — Telehealth: Payer: Self-pay

## 2021-08-27 NOTE — Telephone Encounter (Signed)
This nurse reached out to patient and made aware of phone visit scheduled with MD on 08/28/2021.  Patient is in agreement.  No further questions or concerns at this time.

## 2021-08-28 ENCOUNTER — Telehealth: Payer: Self-pay | Admitting: Obstetrics and Gynecology

## 2021-08-28 ENCOUNTER — Inpatient Hospital Stay (HOSPITAL_BASED_OUTPATIENT_CLINIC_OR_DEPARTMENT_OTHER): Payer: Medicaid Other | Admitting: Hematology

## 2021-08-28 DIAGNOSIS — C19 Malignant neoplasm of rectosigmoid junction: Secondary | ICD-10-CM

## 2021-08-28 MED ORDER — METRONIDAZOLE 0.75 % VA GEL: 1.0000 | Freq: Two times a day (BID) | VAGINAL | 0 refills | Status: DC

## 2021-08-28 MED ORDER — METRONIDAZOLE 500 MG PO TABS: 500.0000 mg | ORAL_TABLET | Freq: Two times a day (BID) | ORAL | 0 refills | Status: DC

## 2021-08-28 NOTE — Telephone Encounter (Signed)
Telephone call from patient stating she has BV.  She has had it before and has same symptoms.   Flagyl routed to pharmacy per protocol.

## 2021-08-28 NOTE — Progress Notes (Signed)
[place]   [phone] Fax:([phone]   Clinic Follow up Note   Patient Care Team: Pcp, No as PCP - General [name], MD as Consulting Physician (Oncology) [name], MD as Consulting Physician (General Surgery)  Date of Service:  08/28/2021  I connected with Stephanie [name] on 08/28/2021 at 12:00 PM EST by telephone visit and verified that I am speaking with the correct person using two identifiers.  I discussed the limitations, risks, security and privacy concerns of performing an evaluation and management service by telephone and the availability of in person appointments. I also discussed with the patient that there may be a patient responsible charge related to this service. The patient expressed understanding and agreed to proceed.   Other persons participating in the visit and their role in the encounter:  none  Patient's location:  home Provider's location:  my office  CHIEF COMPLAINT: f/u of metastatic colon cancer  CURRENT THERAPY:  Surveillance  ASSESSMENT & PLAN:  Stephanie [name] is a 40 y.o. female with   1. 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 She was found to have perforation and positive radial margin. Liver biopsy on 04/16/2020 confirmed metastatic disease from her colon cancer -She received first line chemo with FOLFOXIRI q2 weeks for 6 months from 05/23/20 to 10/24/20. Bevacizumab-bvzr [name] added with C2.  -10/2020 CT Chest and MRI abdomen shows decreased oligo liver metastasis to 1.3cm, no evidence of other metastasis. -She underwent liver resection on 12/13/20 under Dr. Barry Dienes. Pathology showed: metastatic colon carcinoma to liver showing approximately 80% necrosis, resection margin negative.  -She  underwent colostomy takedown on 05/30/21 with Dr. Barry Dienes. Pathology was benign. -since her last visit, she presented to ED 08/22/21 with some rectal bleeding. Physical exam was negative. CT AP was also negative, aside from constipation. I reviewed with her and advised her to use miralax more frequent for constipation  -she is clinically doing well, no other concerns  -f/u in Feb, next scan in mid Feb    2.  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 She notes she does drop small things. She rates it has 5/10. -She was enrolled in our clinical study on 03/07/21 but opted to withdraw on 03/29/21.   3. Anemia, secondary to #1 and iron deficiency from GI Blood loss. Resolved s/p IV Feraheme in 02/2020 and 05/11/20.  -anemia resolved now    4. Mild thrombocytopenia -Secondary to chemotherapy -overall stable, 96k on 08/22/21   5. Genetics  -Due to her young age, I recommended genetic testing to ruled out cancer syndrome.  She initially wanted to wait due to her insurance issue -Genetics consult on 12/04/20, genetic test was drawn 01/17/21 -Results were negative  6. BV -she reports she is prone to infections when sexually active and notes recurrent symptoms -I will prescribe metrogel for her to try. She will f/u with her GYN      Plan: -I prescribed metrogel -lab, flush, and survivorship with NP [name] 09/12/21 -f/u in 3 months with lab, flush, and CT CAP several days before   No problem-specific Assessment & Plan notes found for this encounter.    SUMMARY OF ONCOLOGIC HISTORY: Oncology History Overview Note  Cancer Staging Malignant neoplasm of rectosigmoid junction ([place]) Staging  form: Colon and Rectum, AJCC 8th Edition - Pathologic stage from 04/04/2020: pT4a, pN1b, cM1 - Signed by [name], NP on 05/07/2020    Malignant neoplasm of rectosigmoid junction ([place])  11/10/2019 Imaging   MRI Abdomen  IMPRESSION: 1. Redemonstrated  hypoenhancing lesion of the posterior liver dome, hepatic segment VII, reduced in size compared to prior examination, measuring 1.3 x 1.3 cm, previously 1.8 x 1.7 cm. Findings are consistent with treatment response of a biopsy proven metastasis. No other evidence of lymphadenopathy or metastatic disease within the abdomen or pelvis. 2. Unchanged mild splenomegaly, maximum coronal span 14.0 cm. 3. 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   03/12/2020 Initial Diagnosis   Malignant neoplasm of rectosigmoid junction ([place])   03/12/2020 Imaging   CT AP with contrast IMPRESSION: 1. Overall findings are highly concerning for colorectal carcinoma involving the sigmoid colon with an associated perforation and adjacent abscess and phlegmon formation as detailed above. Currently, no collection is amenable to percutaneous drainage given their small size and location. 2. New 2 cm mass in the right hepatic lobe concerning for metastatic disease to the liver until proven otherwise. 3. Enlarged regional lymph nodes as detailed above is concerning for nodal metastatic disease. 4. Large stool burden. 5. Prominent pelvic veins which can be seen in patients with pelvic congestion syndrome.   03/13/2020 Imaging   ABD US IMPRESSION: Approximately 2.1 x 2.4 x 2.0 cm lobular homogeneously echogenic lesion in the right hepatic dome corresponds with the abnormality seen on the prior CT scan. Sonographically, this appearance is highly suggestive of a benign hemangioma.   Recommend MRI of the abdomen with gadolinium contrast which may provide a noninvasive diagnosis of benign hemangioma.    03/13/2020 Imaging   MR ABD W/WO CONTRAST Hepatobiliary: Diffuse low signal intensity throughout the hepatic parenchyma on T2 weighted images, presumably a consequence of recent Feraheme injection. In segment 7 of the liver (axial image 8 of series 5) there is a 2.5 x 1.9 cm well-defined  lesion which is slightly T2 hyperintense. This lesion appears hyperintense on pre gadolinium T1 weighted images (likely a consequence of Feraheme). Interpretation of enhancement within the lesion is compromised by presence of Feraheme. No other hepatic lesions are confidently identified on today's examination. No intra or extrahepatic biliary ductal dilatation. Gallbladder is normal in appearance.   03/31/2020 Imaging   CT AP W contrast IMPRESSION: 1. Previously noted sigmoid colon mass appears increased in size, and again appears to be associated with a focal contained perforation which crosses the midline and has fistulized into the left adnexal region where there is now what appears to be a large left tubo-ovarian abscess, as detailed above. This is also associated with multiple enlarged lymph nodes in the pelvis measuring up to 1.2 cm in short axis and borderline enlarged retroperitoneal lymph nodes, concerning for metastatic disease. In addition, previously suspected metastatic lesion in segment 7 of the liver has enlarged. 2. Small volume of ascites. 3. Additional incidental findings, as above.   04/04/2020 Cancer Staging   Staging form: Colon and Rectum, AJCC 8th Edition - Pathologic stage from 04/04/2020: pT4a, pN1b, cM1 - Signed by [name], NP on 05/07/2020    04/04/2020 Procedure   Paracentesis, path showed no malignant cells (mixed acute and chronic inflammation present)   04/04/2020 Surgery   Open sigmoid colectomy and end colostomy by Dr. [name]   04/04/2020 Pathology Results   FINAL MICROSCOPIC DIAGNOSIS: A. COLON, RECTOSIGMOID, RESECTION: -  Invasive moderately differentiated adenocarcinoma, 6 cm, involving rectosigmoid junction - Carcinoma invades into serosal surface with perforation and associated serositis - Radial resection margin is positive for carcinoma; proximal and distal margins are not involved - Lymphovascular invasion is present - Metastatic  carcinoma to one of fifteen lymph nodes (1/15); one tumor deposit - See oncology table B. LYMPH NODES, MESENTERIC, RESECTION: - Metastatic adenocarcinoma to one of six lymph nodes (1/6) - One tumor deposit  Addendum to note 2 involved lymph nodes (of 21 examined nodes) pT4a,pN1b MMR-normal, preserved expression of MLH1, MSH2, MSH6, PMS2   04/12/2020 Procedure   PAC placement    04/13/2020 Imaging   CT chest without contrast IMPRESSION: Interval development of bilateral pleural effusions, left slightly greater than right, with resultant bibasilar atelectasis including subtotal collapse of the left lower lobe. No evidence of intrathoracic metastatic disease, though evaluation of the collapsed parenchyma is limited. Hepatic metastasis again demonstrated.     04/16/2020 Pathology Results   FINAL MICROSCOPIC DIAGNOSIS:  A. LIVER, RIGHT LOBE, BIOPSY:  - Adenocarcinoma.  COMMENT:  The morphology is compatible with the provided clinical history of colorectal carcinoma.    05/23/2020 - 10/24/2020 Chemotherapy   FOLFIRINOX q2weeks for 3-6 months starting 05/23/20. Bevacizumab-bvzr [name] added with C2. Oxaliplatin held with C11-12 due to reaction. (pt developed SOB, chest palpitation and abdominal discomfort shortly after oxaliplatin started). Completed on 10/24/20.   08/05/2020 Imaging   CT AP  IMPRESSION: 1. Postsurgical changes of distal colectomy with a left lower quadrant end ostomy. No evidence of obstruction or acute complication at this time. Excluded rectal pouch in the deep pelvis without acute complication or worrisome features. 2. Slight interval decrease in size of a hypoattenuating lesion posterior right lobe liver measuring 1.7 x 1.8 x 2 cm. This lesion has previously undergone ultrasound-guided biopsy with pathologic results demonstrating adenocarcinoma compatible with metastatic disease from patient's resected colorectal carcinoma. 3. Slight prominence of the parametrial  vessels bilaterally, nonspecific though can be seen in the setting of pelvic congestion syndrome. 4. Mild splenomegaly.  No focal lesion.     11/12/2020 Imaging   CT Chest  IMPRESSION: 1. No evidence of metastatic disease in the chest. 2. 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   12/13/2020 Surgery   A. 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 The report date is 01/28/2021.   The CustomNext-Cancer+RNAinsight panel offered by [facility] included sequencing and rearrangement analysis for the following 47 genes:  APC, ATM, AXIN2, BARD1, BMPR1A, BRCA1, BRCA2, BRIP1, CDH1, CDK4, CDKN2A, CHEK2, DICER1, EPCAM, GREM1, HOXB13, MEN1, MLH1, MSH2, MSH3, MSH6, MUTYH, NBN, NF1, NF2, NTHL1, PALB2, PMS2, POLD1, POLE, PTEN, RAD51C, RAD51D, RECQL, RET, SDHA, SDHAF2, SDHB, SDHC, SDHD, SMAD4, SMARCA4, STK11, TP53, TSC1, TSC2, and VHL.  RNA data is routinely analyzed for use in variant interpretation for all genes.   02/19/2021 Imaging   CT A/P w/o contrast  IMPRESSION: Slightly limited examination examination in absence of contrast administration. Status post partial right hepatectomy. Interval decrease in size in perihepatic fluid collection and resolution of right subdiaphragmatic fluid and gas. No new intra-abdominal fluid collections are  identified.   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 Moderate stool throughout the colon without evidence of obstruction.   Fluid distension of the proximal duodenum to the level of the  SMA hiatus which appears narrow. 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   05/05/2021 Imaging   CT AP  IMPRESSION: Postsurgical changes as described stable in appearance from the prior exam.   Changes suggestive of mild pelvic varices.   08/22/2021 Imaging   EXAM: CT ABDOMEN AND PELVIS WITH CONTRAST  IMPRESSION: 1. Extensive bowel content is identified throughout the colon consistent with constipation. 2. 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 3. Stable postoperative changes in the right lobe liver.      INTERVAL HISTORY:  Stephanie [name] was contacted for a follow up of metastatic colon cancer. She was last seen by me on 08/02/21.  She presented to the ED on 08/22/21 with rectal bleeding. She reports it wasn't too bad, present in the stool and with wiping, and resolved after "a few more poops." She notes there was no abnormality of exam in the ED. She also had questions regarding antibiotics being related to colon cancer. She notes she has a history of BV related to sexual activity and was concerned the antibiotics could increase her risk.   All other systems were reviewed with the patient and are negative.  MEDICAL HISTORY:  Past Medical History:  Diagnosis Date   Anxiety    Blood transfusion without reported diagnosis    Bowel obstruction (HCC)    BV (bacterial vaginosis)    Colon cancer ([place])    Colon Cancer 03-2020   Colon polyps    Colostomy present ([place])    03-2020   Family history of bladder cancer    History of chemotherapy    ended 09-2020   Lactose intolerance 03/12/2020   Neuromuscular disorder (HCC)    neuropathy feet hands legs   UTI (lower urinary tract infection)     SURGICAL HISTORY: Past Surgical History:  Procedure Laterality Date   CESAREAN SECTION     CHOLECYSTECTOMY   12/13/2020   COLECTOMY  03/2020   COLONOSCOPY     COLOSTOMY TAKEDOWN N/A 05/30/2021   Procedure: LAPAROSCOPIC ASSISTED COLOSTOMY TAKEDOWN;  Surgeon: [name], MD;  Location: [facility];  Service: General;  Laterality: N/A;   CYSTOSCOPY WITH STENT PLACEMENT  04/04/2020   Procedure: CYSTOSCOPY WITH STENT PLACEMENT;  Surgeon: [name], MD;  Location: [facility];  Service: General;;   LAPAROSCOPIC LIVER ULTRASOUND N/A 12/13/2020   Procedure: INTRAOPERATIVE LIVER ULTRASOUND;  Surgeon: [name], MD;  Location: [facility];  Service: General;  Laterality: N/A;   LAPAROSCOPY N/A 12/13/2020   Procedure: LAPAROSCOPY DIAGNOSTIC;  Surgeon: [name], MD;  Location: [facility];  Service: General;  Laterality: N/A;   LAPAROTOMY N/A 04/04/2020   Procedure: EXPLORATORY LAPAROTOMY;  Surgeon: [name], MD;  Location: [facility];  Service: General;  Laterality: N/A;   OPEN PARTIAL HEPATECTOMY  N/A 12/13/2020   Procedure: OPEN PARTIAL HEPATECTOMY;  Surgeon: [name], MD;  Location: [facility];  Service: General;  Laterality: N/A;  ROOM 2 STARTING AT 09:30AM FOR 300 MIN   PORTACATH PLACEMENT Right 04/12/2020   Procedure: INSERTION PORT-A-CATH WITH ULTRASOUND;  Surgeon: Kieth Brightly Arta Bruce, MD;  Location: [facility];  Service: General;  Laterality: Right;    I have reviewed the social  history and family history with the patient and they are unchanged from previous note.  ALLERGIES:  is allergic to oxaliplatin.  MEDICATIONS:  Current Outpatient Medications  Medication Sig Dispense Refill   metroNIDAZOLE (METROGEL) 0.75 % vaginal gel Place 1 Applicatorful vaginally 2 (two) times daily. For 5 days 70 g 0   acetaminophen (TYLENOL) 325 MG tablet Take 2 tablets (650 mg total) by mouth every 6 (six) hours as needed for mild pain, moderate pain, fever or headache (fever > 101). (Patient not taking: Reported on 08/05/2021)     cephALEXin (KEFLEX) 500 MG capsule Take 1 capsule (500 mg total) by mouth 3 (three) times daily.  (Patient not taking: Reported on 08/05/2021) 15 capsule 0   clonazePAM (KLONOPIN) 0.5 MG tablet Take 1 tablet (0.5 mg total) by mouth daily as needed for anxiety. (Patient not taking: Reported on 08/05/2021) 15 tablet 0   ibuprofen (ADVIL) 200 MG tablet Take 400 mg by mouth every 6 (six) hours as needed for headache, fever or mild pain.     Lactic Ac-Citric Ac-Pot Bitart (PHEXXI) 1.8-1-0.4 % GEL Place 5 g vaginally as needed. (Patient not taking: Reported on 08/22/2021) 60 g 1   meloxicam (MOBIC) 7.5 MG tablet Take 1 tablet (7.5 mg total) by mouth daily. (Patient not taking: Reported on 08/22/2021) 20 tablet 0   methocarbamol (ROBAXIN) 500 MG tablet Take 1 tablet (500 mg total) by mouth every 8 (eight) hours as needed for muscle spasms. (Patient not taking: Reported on 07/10/2021) 20 tablet 1   metroNIDAZOLE (FLAGYL) 500 MG tablet Take 1 tablet (500 mg total) by mouth 2 (two) times daily. 14 tablet 0   nitrofurantoin, macrocrystal-monohydrate, (MACROBID) 100 MG capsule Take 1 capsule (100 mg total) by mouth as needed. Take within 24 hours after each act of intercourse to prevent UTI (Patient not taking: Reported on 08/22/2021) 20 capsule 10   Norethindrone Acetate-Ethinyl Estrad-FE (LOESTRIN 24 FE) 1-20 MG-MCG(24) tablet Take 1 tablet by mouth daily. 28 tablet 11   Prenatal Vit-Fe Fumarate-FA (PRENATAL MULTIVITAMIN) TABS tablet Take 1 tablet by mouth daily at 12 noon.     No current facility-administered medications for this visit.    PHYSICAL EXAMINATION: ECOG PERFORMANCE STATUS: 0 - Asymptomatic  There were no vitals filed for this visit. Wt Readings from Last 3 Encounters:  08/22/21 170 lb (77.1 kg)  08/02/21 150 lb 6.4 oz (68.2 kg)  07/10/21 147 lb (66.7 kg)     No vitals taken today, Exam not performed today  LABORATORY DATA:  I have reviewed the data as listed CBC Latest Ref Rng & Units 08/22/2021 08/02/2021 07/11/2021  WBC 4.0 - 10.5 K/uL 3.6(L) 4.7 5.0  Hemoglobin 12.0 - 15.0 g/dL  13.5 12.9 14.8  Hematocrit 36.0 - 46.0 % 39.9 39.3 44.4  Platelets 150 - 400 K/uL 96(L) 111(L) 110(L)     CMP Latest Ref Rng & Units 08/22/2021 08/02/2021 07/11/2021  Glucose 70 - 99 mg/dL 85 97 92  BUN 6 - 20 mg/dL _0 Creatinine 0.44 - 1.00 mg/dL 0.56 0.59 0.46  Sodium 135 - 145 mmol/L 139 140 135  Potassium 3.5 - 5.1 mmol/L 4.0 4.2 4.0  Chloride 98 - 111 mmol/L 108 110 100  CO2 22 - 32 mmol/L _1 Calcium 8.9 - 10.3 mg/dL 8.8(L) 8.5(L) 9.2  Total Protein 6.5 - 8.1 g/dL 6.3(L) 6.1(L) 6.8  Total Bilirubin 0.3 - 1.2 mg/dL 0.6 0.3 0.7  Alkaline Phos 38 - 126 U/L 44 78  69  AST 15 - 41 U/L _0 ALT 0 - 44 U/L _1 RADIOGRAPHIC STUDIES: I have personally reviewed the radiological images as listed and agreed with the findings in the report. No results found.    No orders of the defined types were placed in this encounter.  All questions were answered. The patient knows to call the clinic with any problems, questions or concerns. No barriers to learning was detected. The total time spent in the appointment was 22 minutes.     [name], MD 08/28/2021   I, Wilburn Mylar, am acting as scribe for [name], MD.   I have reviewed the above documentation for accuracy and completeness, and I agree with the above.

## 2021-08-29 ENCOUNTER — Telehealth: Payer: Self-pay | Admitting: Emergency Medicine

## 2021-08-29 NOTE — Telephone Encounter (Signed)
[id number] - TREATMENT OF ESTABLISHED CHEMOTHERAPY-INDUCED NEUROPATHY WITH N-PALMITOYLETHANOLAMIDE, A CANNABIMIMETIC NUTRACEUTICAL: A RANDOMIZED DOUBLE-BLIND PHASE II PILOT TRIAL  6 MONTH F/U CALL  Contacted pt for 6 month study f/u call.  Patient reports that her bilateral neuropathy in both hands and feet have improved somewhat since last study visit.  She reports not taking any medication at this time for her neuropathy.  Patient has no recurrent cancer issues, was seen by MD Burr Medico yesterday.  Pt denies any questions/concerns at this time, has contact info if she needs anything before 12 month f/u call.  [name] 'Learta CoddingNeysa Bonito, RN, BSN Clinical Research Nurse I 08/29/21 11:36 AM

## 2021-08-30 ENCOUNTER — Telehealth: Payer: Self-pay | Admitting: Hematology

## 2021-08-30 NOTE — Telephone Encounter (Signed)
Rescheduled upcoming appointments per 11/30 los. Patient is aware of changes.

## 2021-09-05 ENCOUNTER — Encounter: Payer: Self-pay | Admitting: Hematology

## 2021-09-09 ENCOUNTER — Other Ambulatory Visit: Payer: Self-pay | Admitting: *Deleted

## 2021-09-09 ENCOUNTER — Telehealth: Payer: Self-pay | Admitting: *Deleted

## 2021-09-10 ENCOUNTER — Encounter: Payer: Self-pay | Admitting: Obstetrics and Gynecology

## 2021-09-10 ENCOUNTER — Ambulatory Visit (INDEPENDENT_AMBULATORY_CARE_PROVIDER_SITE_OTHER): Payer: Medicaid Other | Admitting: Obstetrics and Gynecology

## 2021-09-10 ENCOUNTER — Other Ambulatory Visit (HOSPITAL_COMMUNITY)
Admission: RE | Admit: 2021-09-10 | Discharge: 2021-09-10 | Disposition: A | Discharge status: Home / Self Care | Payer: Medicaid Other | Source: Ambulatory Visit | Attending: Obstetrics and Gynecology | Admitting: Obstetrics and Gynecology

## 2021-09-10 ENCOUNTER — Other Ambulatory Visit: Payer: Self-pay

## 2021-09-10 VITALS — BP 111/71 | HR 80 | Ht 68.0 in | Wt 146.0 lb

## 2021-09-10 DIAGNOSIS — Z30011 Encounter for initial prescription of contraceptive pills: Secondary | ICD-10-CM

## 2021-09-10 DIAGNOSIS — N939 Abnormal uterine and vaginal bleeding, unspecified: Secondary | ICD-10-CM | POA: Diagnosis not present

## 2021-09-10 DIAGNOSIS — Z113 Encounter for screening for infections with a predominantly sexual mode of transmission: Secondary | ICD-10-CM | POA: Diagnosis not present

## 2021-09-10 MED ORDER — NORETHINDRONE ACET-ETHINYL EST 1.5-30 MG-MCG PO TABS: 1.0000 | ORAL_TABLET | Freq: Every day | ORAL | 6 refills | Status: DC

## 2021-09-10 NOTE — Progress Notes (Signed)
°  CC: breakthrough bleeding Subjective:    Patient ID: Stephanie [name], female    DOB: 1981/05/03, 40 y.o.   MRN: [id number]  HPI 40 yo G3P1 seen for discussion of breakthrough bleeding on OCP.  She haqs been taking loestrin 24 for two cycles as an extended regimen.  Pt notes intermittent spotting that concerned her.  Discussed she is new to her regimen and usually we allow 3 months before medication modification and that extended regimen can also predispose a patient to breakthrough bleeding.     Review of Systems     Objective:   Physical Exam Vitals:   09/10/21 1338  BP: 111/71  Pulse: 80         Assessment & Plan:   1. Oral contraception initiation  Pt given option of  continuing current OCP and taking it with regular interval Change to higher estrogen OCP loestrin 1.5/30 and restarting 3 month trial If ineffective , consider nuvaring  Pt desires increasing estrogen dose  - Norethindrone Acetate-Ethinyl Estradiol (LOESTRIN 1.5/30, 21,) 1.5-30 MG-MCG tablet; Take 1 tablet by mouth daily.  Dispense: 28 tablet; Refill: 6  2. Screening examination for STD (sexually transmitted disease) Pt with new partner, wishes to be checked - Cervicovaginal ancillary only - Hepatitis C antibody - Hepatitis B surface antigen - HIV Antibody (routine testing w rflx) - RPR  3. Vaginal spotting Checking for inadvertent pregnancy - Beta hCG quant (ref lab)  F/u in 3 months in person to reevaluate cycle control.  I spent 30 minutes dedicated to the care of this patient including previsit review of records, face to face time with the patient discussing history, treatment options, alternatives and post visit testing.   Griffin Basil, MD Faculty Attending, [facility]

## 2021-09-10 NOTE — Progress Notes (Signed)
Patient presents to discuss birth control options. She states that she has been having a break through bleeding and cramping. She states she started using boric acid and then started bleeding after using.

## 2021-09-11 LAB — CERVICOVAGINAL ANCILLARY ONLY
Chlamydia: NEGATIVE
Comment: NEGATIVE
Comment: NEGATIVE
Comment: NORMAL
Neisseria Gonorrhea: NEGATIVE
Trichomonas: NEGATIVE

## 2021-09-12 ENCOUNTER — Other Ambulatory Visit: Payer: Self-pay

## 2021-09-12 ENCOUNTER — Inpatient Hospital Stay (HOSPITAL_BASED_OUTPATIENT_CLINIC_OR_DEPARTMENT_OTHER): Payer: Medicaid Other | Admitting: Nurse Practitioner

## 2021-09-12 ENCOUNTER — Encounter: Payer: Self-pay | Admitting: Nurse Practitioner

## 2021-09-12 ENCOUNTER — Inpatient Hospital Stay: Payer: Medicaid Other | Attending: Nurse Practitioner

## 2021-09-12 VITALS — BP 120/80 | HR 80 | Temp 98.2°F | Resp 18 | Ht 68.0 in | Wt 147.9 lb

## 2021-09-12 DIAGNOSIS — C787 Secondary malignant neoplasm of liver and intrahepatic bile duct: Secondary | ICD-10-CM | POA: Insufficient documentation

## 2021-09-12 DIAGNOSIS — C19 Malignant neoplasm of rectosigmoid junction: Secondary | ICD-10-CM

## 2021-09-12 DIAGNOSIS — Z8052 Family history of malignant neoplasm of bladder: Secondary | ICD-10-CM | POA: Diagnosis not present

## 2021-09-12 DIAGNOSIS — Z87440 Personal history of urinary (tract) infections: Secondary | ICD-10-CM | POA: Insufficient documentation

## 2021-09-12 DIAGNOSIS — Z95828 Presence of other vascular implants and grafts: Secondary | ICD-10-CM

## 2021-09-12 LAB — CBC WITH DIFFERENTIAL (CANCER CENTER ONLY)
Abs Immature Granulocytes: 0.01 10*3/uL (ref 0.00–0.07)
Basophils Absolute: 0 10*3/uL (ref 0.0–0.1)
Basophils Relative: 1 %
Eosinophils Absolute: 0 10*3/uL (ref 0.0–0.5)
Eosinophils Relative: 1 %
HCT: 41.5 % (ref 36.0–46.0)
Hemoglobin: 14 g/dL (ref 12.0–15.0)
Immature Granulocytes: 0 %
Lymphocytes Relative: 17 %
Lymphs Abs: 1 10*3/uL (ref 0.7–4.0)
MCH: 31.7 pg (ref 26.0–34.0)
MCHC: 33.7 g/dL (ref 30.0–36.0)
MCV: 94.1 fL (ref 80.0–100.0)
Monocytes Absolute: 0.4 10*3/uL (ref 0.1–1.0)
Monocytes Relative: 7 %
Neutro Abs: 4.4 10*3/uL (ref 1.7–7.7)
Neutrophils Relative %: 74 %
Platelet Count: 127 10*3/uL — ABNORMAL LOW (ref 150–400)
RBC: 4.41 MIL/uL (ref 3.87–5.11)
RDW: 13.8 % (ref 11.5–15.5)
WBC Count: 5.9 10*3/uL (ref 4.0–10.5)
nRBC: 0 % (ref 0.0–0.2)

## 2021-09-12 LAB — CMP (CANCER CENTER ONLY)
ALT: 15 U/L (ref 0–44)
AST: 15 U/L (ref 15–41)
Albumin: 3.8 g/dL (ref 3.5–5.0)
Alkaline Phosphatase: 52 U/L (ref 38–126)
Anion gap: 8 (ref 5–15)
BUN: 11 mg/dL (ref 6–20)
CO2: 24 mmol/L (ref 22–32)
Calcium: 8.8 mg/dL — ABNORMAL LOW (ref 8.9–10.3)
Chloride: 108 mmol/L (ref 98–111)
Creatinine: 0.73 mg/dL (ref 0.44–1.00)
GFR, Estimated: >60 mL/min (ref >60)
Glucose, Bld: 91 mg/dL (ref 70–99)
Potassium: 4.1 mmol/L (ref 3.5–5.1)
Sodium: 140 mmol/L (ref 135–145)
Total Bilirubin: 0.8 mg/dL (ref 0.3–1.2)
Total Protein: 6.8 g/dL (ref 6.5–8.1)

## 2021-09-12 LAB — CEA (IN HOUSE-CHCC): CEA (CHCC-In House): 2.39 ng/mL (ref 0.00–5.00)

## 2021-09-12 LAB — HEPATITIS C ANTIBODY: Hep C Virus Ab: <0.1 s/co ratio (ref 0.0–0.9)

## 2021-09-12 LAB — BETA HCG QUANT (REF LAB): hCG Quant: <1 m[IU]/mL

## 2021-09-12 LAB — HIV ANTIBODY (ROUTINE TESTING W REFLEX): HIV Screen 4th Generation wRfx: NONREACTIVE

## 2021-09-12 LAB — HEPATITIS B SURFACE ANTIGEN: Hepatitis B Surface Ag: NEGATIVE

## 2021-09-12 LAB — RPR: RPR Ser Ql: NONREACTIVE

## 2021-09-12 MED ORDER — SODIUM CHLORIDE 0.9% FLUSH
10.0000 mL | Freq: Once | INTRAVENOUS | Status: AC
  Administered 2021-09-12: 10 mL

## 2021-09-12 MED ORDER — HEPARIN SOD (PORK) LOCK FLUSH 100 UNIT/ML IV SOLN
500.0000 [IU] | Freq: Once | INTRAVENOUS | Status: AC
  Administered 2021-09-12: 500 [IU]

## 2021-09-12 NOTE — Progress Notes (Signed)
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 Redemonstrated hypoenhancing lesion of the posterior liver dome, hepatic segment VII, reduced in size compared to prior examination, measuring 1.3 x 1.3 cm, previously 1.8 x 1.7 cm. Findings are consistent with treatment response of a biopsy proven metastasis. No other evidence of lymphadenopathy or metastatic disease within the abdomen or pelvis. 2. Unchanged mild splenomegaly, maximum coronal span 14.0 cm. 3. 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   03/12/2020 Initial Diagnosis   Malignant neoplasm of rectosigmoid junction ([place])   03/12/2020 Imaging   CT AP with contrast IMPRESSION: 1. Overall findings are highly concerning for colorectal carcinoma involving the sigmoid colon with an associated perforation and adjacent abscess and phlegmon formation as detailed above. Currently, no collection is amenable to percutaneous drainage given their small size and location. 2. New 2 cm mass in the right hepatic lobe concerning for metastatic disease to the liver until proven otherwise. 3. Enlarged regional lymph nodes as detailed above is concerning for nodal metastatic disease. 4. Large stool burden. 5.  Prominent pelvic veins which can be seen in patients with pelvic congestion syndrome.   03/13/2020 Imaging   ABD US IMPRESSION: Approximately 2.1 x 2.4 x 2.0 cm lobular homogeneously echogenic lesion in the right hepatic dome corresponds with the abnormality seen on the prior CT scan. Sonographically, this appearance is highly suggestive of a benign hemangioma.   Recommend MRI of the abdomen with gadolinium contrast which may provide a noninvasive diagnosis of benign hemangioma.    03/13/2020 Imaging   MR ABD W/WO CONTRAST Hepatobiliary: Diffuse low signal intensity throughout the hepatic parenchyma on T2 weighted images, presumably a consequence of recent Feraheme injection. In segment 7 of the liver (axial image 8 of series 5) there is a 2.5 x 1.9 cm well-defined lesion which is slightly T2 hyperintense. This lesion appears hyperintense on pre gadolinium T1 weighted images (likely a consequence of Feraheme). Interpretation of enhancement within the lesion is compromised by presence of Feraheme. No other hepatic lesions are confidently identified on today's examination. No intra or extrahepatic biliary ductal dilatation. Gallbladder is normal in appearance.   03/31/2020 Imaging   CT AP W contrast IMPRESSION: 1. Previously noted sigmoid colon mass appears increased in size, and again appears to be associated with a focal contained perforation which crosses the midline and has fistulized into the left adnexal region where there is now what appears to be a large left tubo-ovarian abscess, as detailed above. This is also associated with multiple enlarged lymph nodes in the pelvis measuring up to 1.2 cm in short axis and borderline enlarged retroperitoneal lymph nodes, concerning for metastatic disease. In addition, previously suspected metastatic lesion in segment 7 of the liver has enlarged. 2. Small volume of ascites. 3. Additional incidental findings, as above.  04/04/2020 Cancer  Staging   Staging form: Colon and Rectum, AJCC 8th Edition - Pathologic stage from 04/04/2020: pT4a, pN1b, cM1 - Signed by [name], NP on 05/07/2020    04/04/2020 Procedure   Paracentesis, path showed no malignant cells (mixed acute and chronic inflammation present)   04/04/2020 Surgery   Open sigmoid colectomy and end colostomy by Dr. [name]   04/04/2020 Pathology Results   FINAL MICROSCOPIC DIAGNOSIS: A. COLON, RECTOSIGMOID, RESECTION: - Invasive moderately differentiated adenocarcinoma, 6 cm, involving rectosigmoid junction - Carcinoma invades into serosal surface with perforation and associated serositis - Radial resection margin is positive for carcinoma; proximal and distal margins are not involved - Lymphovascular invasion is present - Metastatic carcinoma to one of fifteen lymph nodes (1/15); one tumor deposit - See oncology table B. LYMPH NODES, MESENTERIC, RESECTION: - Metastatic adenocarcinoma to one of six lymph nodes (1/6) - One tumor deposit  Addendum to note 2 involved lymph nodes (of 21 examined nodes) pT4a,pN1b MMR-normal, preserved expression of MLH1, MSH2, MSH6, PMS2   04/12/2020 Procedure   PAC placement    04/13/2020 Imaging   CT chest without contrast IMPRESSION: Interval development of bilateral pleural effusions, left slightly greater than right, with resultant bibasilar atelectasis including subtotal collapse of the left lower lobe. No evidence of intrathoracic metastatic disease, though evaluation of the collapsed parenchyma is limited. Hepatic metastasis again demonstrated.     04/16/2020 Pathology Results   FINAL MICROSCOPIC DIAGNOSIS:  A. LIVER, RIGHT LOBE, BIOPSY:  - Adenocarcinoma.  COMMENT:  The morphology is compatible with the provided clinical history of colorectal carcinoma.    05/23/2020 - 10/24/2020 Chemotherapy   FOLFIRINOX q2weeks for 3-6 months starting 05/23/20. Bevacizumab-bvzr [name] added with C2. Oxaliplatin held with  C11-12 due to reaction. (pt developed SOB, chest palpitation and abdominal discomfort shortly after oxaliplatin started). Completed on 10/24/20.   08/05/2020 Imaging   CT AP  IMPRESSION: 1. Postsurgical changes of distal colectomy with a left lower quadrant end ostomy. No evidence of obstruction or acute complication at this time. Excluded rectal pouch in the deep pelvis without acute complication or worrisome features. 2. Slight interval decrease in size of a hypoattenuating lesion posterior right lobe liver measuring 1.7 x 1.8 x 2 cm. This lesion has previously undergone ultrasound-guided biopsy with pathologic results demonstrating adenocarcinoma compatible with metastatic disease from patient's resected colorectal carcinoma. 3. Slight prominence of the parametrial vessels bilaterally, nonspecific though can be seen in the setting of pelvic congestion syndrome. 4. Mild splenomegaly.  No focal lesion.     11/12/2020 Imaging   CT Chest  IMPRESSION: 1. No evidence of metastatic disease in the chest. 2. 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   12/13/2020 Surgery   A. 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 The report date is 01/28/2021.   The CustomNext-Cancer+RNAinsight panel offered by [facility] included sequencing and rearrangement analysis for the following 47 genes:  APC, ATM, AXIN2, BARD1, BMPR1A, BRCA1, BRCA2, BRIP1, CDH1, CDK4, CDKN2A, CHEK2, DICER1, EPCAM, GREM1, HOXB13, MEN1, MLH1, MSH2, MSH3, MSH6, MUTYH, NBN, NF1, NF2, NTHL1,  PALB2, PMS2, POLD1,  POLE, PTEN, RAD51C, RAD51D, RECQL, RET, SDHA, SDHAF2, SDHB, SDHC, SDHD, SMAD4, SMARCA4, STK11, TP53, TSC1, TSC2, and VHL.  RNA data is routinely analyzed for use in variant interpretation for all genes.   02/19/2021 Imaging   CT A/P w/o contrast  IMPRESSION: Slightly limited examination examination in absence of contrast administration. Status post partial right hepatectomy. Interval decrease in size in perihepatic fluid collection and resolution of right subdiaphragmatic fluid and gas. No new intra-abdominal fluid collections are identified.   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 Moderate stool throughout the colon without evidence of obstruction.   Fluid distension of the proximal duodenum to the level of the SMA hiatus which appears narrow. 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   05/05/2021 Imaging   CT AP  IMPRESSION: Postsurgical changes as described stable in appearance from the prior exam.   Changes suggestive of mild pelvic varices.   08/22/2021 Imaging   EXAM: CT ABDOMEN AND PELVIS WITH CONTRAST  IMPRESSION: 1. Extensive bowel content is identified throughout the colon consistent with constipation. 2. 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 3. Stable postoperative changes in the right lobe liver.   09/12/2021 Survivorship   SCP delivered by Santiago Glad, NP     INTERVAL HISTORY:  Ms. Reitano presents to the Survivorship Clinic today for our initial meeting to review her survivorship care plan detailing her treatment course for colon cancer, as well as monitoring long-term side effects of that treatment, education regarding health maintenance, screening, and overall wellness and health promotion.     Overall, Ms. Mittelman reports  feeling well. She has minimal residual side effects from chemotherapy, mainly neuropathy in her feet which is more prominent if she does not exercise. 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 She is working part time and seeing someone. She is having behavior/anger troubles with her 65 year old daughter who is in therapy. Pt is also working on healthy stress management strategies. Ms. Kinnick is having painful cramping periods and breakthrough bleeding on OCP. She ran out of fiber in November and became constipated with 1 episode of blood in stool. It was after hours so she went to urgent care, CT was unremarkable. She has normal bowel habits lately, denies pain/bloating, n/v, unintentional weight loss, signs of juandice,  recent cough, chest pain, dyspnea    ONCOLOGY TREATMENT TEAM:  1. Surgeon:  Dr. Donell Beers at [facility] 2. Medical Oncologist: Dr. Mosetta Putt     PAST MEDICAL/SURGICAL HISTORY:  Past Medical History:  Diagnosis Date   Anxiety    Blood transfusion without reported diagnosis    Bowel obstruction (HCC)    BV (bacterial vaginosis)    Colon cancer [facility])    Colon Cancer 03-2020   Colon polyps    Colostomy present [facility])    03-2020   Family history of bladder cancer    History of chemotherapy    ended 09-2020   Lactose intolerance 03/12/2020   Neuromuscular disorder (HCC)    neuropathy feet hands legs   UTI (lower urinary tract infection)    Past Surgical History:  Procedure Laterality Date   CESAREAN SECTION     CHOLECYSTECTOMY  12/13/2020   COLECTOMY  03/2020   COLONOSCOPY     COLOSTOMY TAKEDOWN N/A 05/30/2021   Procedure: LAPAROSCOPIC ASSISTED COLOSTOMY TAKEDOWN;  Surgeon: Almond Lint, MD;  Location: [facility] OR;  Service: General;  Laterality: N/A;   CYSTOSCOPY WITH STENT PLACEMENT  04/04/2020   Procedure: CYSTOSCOPY WITH STENT PLACEMENT;  Surgeon: [name], MD;  Location: [facility] ORS;  Service: General;;   LAPAROSCOPIC LIVER ULTRASOUND N/A 12/13/2020    Procedure: INTRAOPERATIVE LIVER ULTRASOUND;  Surgeon: [name], MD;  Location: [place];  Service: General;  Laterality: N/A;   LAPAROSCOPY N/A 12/13/2020   Procedure: LAPAROSCOPY DIAGNOSTIC;  Surgeon: [name], MD;  Location: [place];  Service: General;  Laterality: N/A;   LAPAROTOMY N/A 04/04/2020   Procedure: EXPLORATORY LAPAROTOMY;  Surgeon: [name], MD;  Location: [facility];  Service: General;  Laterality: N/A;   OPEN PARTIAL HEPATECTOMY  N/A 12/13/2020   Procedure: OPEN PARTIAL HEPATECTOMY;  Surgeon: [name], MD;  Location: [place];  Service: General;  Laterality: N/A;  ROOM 2 STARTING AT 09:30AM FOR 300 MIN   PORTACATH PLACEMENT Right 04/12/2020   Procedure: INSERTION PORT-A-CATH WITH ULTRASOUND;  Surgeon: Mickeal Skinner, MD;  Location: [facility];  Service: General;  Laterality: Right;     ALLERGIES:  Allergies  Allergen Reactions   Oxaliplatin Shortness Of Breath     CURRENT MEDICATIONS:  Outpatient Encounter Medications as of 09/12/2021  Medication Sig   acetaminophen (TYLENOL) 325 MG tablet Take 2 tablets (650 mg total) by mouth every 6 (six) hours as needed for mild pain, moderate pain, fever or headache (fever > 101).   ibuprofen (ADVIL) 200 MG tablet Take 400 mg by mouth every 6 (six) hours as needed for headache, fever or mild pain.   Lactic Ac-Citric Ac-Pot Bitart (PHEXXI) 1.8-1-0.4 % GEL Place 5 g vaginally as needed. (Patient not taking: Reported on 08/22/2021)   meloxicam (MOBIC) 7.5 MG tablet Take 1 tablet (7.5 mg total) by mouth daily. (Patient not taking: Reported on 08/22/2021)   Norethindrone Acetate-Ethinyl Estradiol (LOESTRIN 1.5/30, 21,) 1.5-30 MG-MCG tablet Take 1 tablet by mouth daily.   Prenatal Vit-Fe Fumarate-FA (PRENATAL MULTIVITAMIN) TABS tablet Take 1 tablet by mouth daily at 12 noon.   No facility-administered encounter medications on file as of 09/12/2021.     ONCOLOGIC FAMILY HISTORY:  Family History  Problem Relation Age of  Onset   Irritable bowel syndrome Mother    Heart disease Father    Stroke Father    Aneurysm Father    Irritable bowel syndrome Brother    Other Brother        gluten intolerance   Bladder Cancer Maternal Grandmother        dx 90s   Colon cancer Neg Hx    Esophageal cancer Neg Hx    Colon polyps Neg Hx    Stomach cancer Neg Hx    Rectal cancer Neg Hx      GENETIC COUNSELING/TESTING: Yes, negative. Pt was 38 at diagnosis, daughter needs to begin colonoscopies no later than 29, or sooner if red flags   SOCIAL HISTORY:  JAYCI ELLEFSON is single with 1 daughter.  Ms. Vanderford is currently working part-time. She denies any recent tobacco use, quit many years ago. She does not drink alcohol or use illicit drug use.     PHYSICAL EXAMINATION:  Vital Signs:   Vitals:   09/12/21 1233  BP: 120/80  Pulse: 80  Resp: 18  Temp: 98.2 F (36.8 C)  SpO2: 99%   Filed Weights   09/12/21 1233  Weight: 147 lb 14.4 oz (67.1 kg)   General: Well-nourished, well-appearing female in no acute distress.   HEENT:  Sclerae anicteric.  Lymph: No cervical, supraclavicular, or infraclavicular lymphadenopathy noted on palpation.  Cardiovascular: Regular rate and rhythm. Respiratory: Clear; breathing non-labored.  GI: Abdomen soft and round; non-tender, non-distended. Bowel sounds normoactive. Multiple healed abdominal scars  Neuro: No focal deficits. Steady gait.  Psych: Mood and affect normal and appropriate for situation.  Extremities: No edema. Skin: Warm and dry. PAC without erythema   LABORATORY DATA:  None for this visit.  DIAGNOSTIC IMAGING:  None for this visit.      ASSESSMENT AND PLAN:  Ms.. Holleman is a pleasant 40 y.o. female with Stage IV rectosigmoid colon cancer KRAS+, diagnosed in 04/04/20, treated with R colectomy, 6 months adjuvant chemotherapy (FOLFIRINOX/beva), liver resection, and colostomy takedown in 05/2021. She presents to the Survivorship Clinic for our  initial meeting and routine follow-up post-completion of treatment for colon cancer.    1. Stage IV rectosigmoid colon cancer:  Ms. Saltsman has recovered well from intensive colon cancer treatment. Exam is benign, labs are stable with improving thrombocytopenia. There is no clnical concern for recurrence. She continues q6-8 week port flushes, we discussed leaving that in place or at least 1 year after completion of therapy, due to her high recurrence risk (highest in first 1-3 years). She will follow-up with her medical oncologist, Dr. Burr Medico in 11/15/20 with surveillance CT few days prior. Today, a comprehensive survivorship care plan and treatment summary was reviewed with the patient today detailing her colon cancer diagnosis, treatment course, potential late/long-term effects of treatment, appropriate follow-up care with recommendations for the future, and patient education resources.  A copy of this summary, along with a letter will be sent to the patients primary care provider via In Basket message after todays visit.    2. 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 Improved with exercise. Able to function and work without difficulty.   3. Ob/Gyn: She has h/o recurrent BV and UTI, she has macrobid x1 PRN after intercourse. She did not like macrogel suppository. She is also experiencing breakthrough bleeding on OCP. We discussed menstrual changes during/near chemo. She will f/up with Ob/gyn.   3. Bone health:  Given Ms. Mittelman's premenopausal age, she does not yet need to begin DEXA screening. However, she understands chemotherapy is a risk factor for decreased bone mineral density. She gets constipated from calcium and is lactose intolerant. I recommend calcium/vit D 3 times per week. She actively engages in weight bearing exercise.   4. Cancer screening:  Due to Ms. Mittelman's history and her age, she should receive screening for skin cancers, colon cancer,  breast cancer, and gynecologic cancers.  The information and recommendations are listed on the patient's comprehensive care plan/treatment summary and were reviewed in detail with the patient.    5. Health maintenance and wellness promotion: Ms. Norenberg was encouraged to consume 5-7 servings of fruits and vegetables per day. We reviewed the "Nutrition Rainbow" handout, as well as the handout "Take Control of Your Health and Reduce Your Cancer Risk" from the [employer].  She was also encouraged to engage in moderate to vigorous exercise for 30 minutes per day most days of the week. We discussed the [employer] fitness program, which is designed for cancer survivors to help them become more physically fit after cancer treatments.  She was instructed to limit her alcohol consumption and continue to abstain from tobacco use.     6. Support services/counseling: It is not uncommon for this period of the patient's cancer care trajectory to  be one of many emotions and stressors.  We discussed an opportunity for her to participate in support groups. I encouraged her to continue her one-on-one therapy and with her daughter.  Ms. Polzin was encouraged to take advantage of our many other support services programs, support groups, and/or counseling in coping with her new life as a cancer survivor after completing anti-cancer treatment.  She was offered support today through active listening and expressive supportive counseling.  She was given information regarding our available services and encouraged to contact me with any questions or for help enrolling in any of our support group/programs.    Dispo:   -next surveillance CT with lab/flush 11/12/21 -Follow up with Dr. Burr Medico 11/15/21  -Follow up with surgery as scheduled (? 09/2021). Can alternate with med onc after next visits.  -anticipate next colonoscopy 02/2024, or sooner if red flags -She is welcome to return back to the Survivorship Clinic at  any time -Consider referral back to survivorship as a long-term survivor for continued surveillance  A total of (40) minutes of face-to-face time was spent with this patient with greater than 50% of that time in counseling and care-coordination.   Cira Rue, NP Survivorship Program [facility] [phone]   Note: PRIMARY CARE PROVIDER Pcp, No None None

## 2021-09-19 ENCOUNTER — Telehealth: Payer: Self-pay | Admitting: *Deleted

## 2021-09-19 NOTE — Telephone Encounter (Signed)
Pt called to office stating she has been having some "pinching" like pain in her bilateral back, ?cyst related. Pt has been having some spotting recently with OCP use, unsure if related to bleeding or possible ovarian cyst. Pt denies any urinary complaints. Pt advised to monitor symptoms, keep appt as scheduled.  May take Tylenol/Ibuprofen as needed, heat packs. Advised if pain is severe or intolerable she should be evaluated.   Pt states understanding

## 2021-09-26 ENCOUNTER — Other Ambulatory Visit: Payer: Self-pay

## 2021-09-26 NOTE — Progress Notes (Signed)
Spoke with pt regarding lower back pain.  Pt c/o of 9/10 lower back pain x2wks.  Pt stated she's applying heat and taking Ibuprofen but it's not helping her pain.  Pt stated she has hx of ovarian cyst but has not had a recent f/u with GYN.  Pt denied burning, bleeding, pain, and increased urine frequency.  Pt wants to be seen tomorrow by Dr. Burr Medico or seen in [facility].  Sent message to both providers to see if pt can be seen for pain management and further assessment.

## 2021-09-27 ENCOUNTER — Other Ambulatory Visit: Payer: Self-pay

## 2021-09-27 ENCOUNTER — Inpatient Hospital Stay (HOSPITAL_BASED_OUTPATIENT_CLINIC_OR_DEPARTMENT_OTHER): Payer: Medicaid Other | Admitting: Physician Assistant

## 2021-09-27 VITALS — BP 120/77 | HR 57 | Temp 98.2°F | Resp 17 | Wt 147.4 lb

## 2021-09-27 DIAGNOSIS — M545 Low back pain, unspecified: Secondary | ICD-10-CM | POA: Diagnosis not present

## 2021-09-27 DIAGNOSIS — C19 Malignant neoplasm of rectosigmoid junction: Secondary | ICD-10-CM | POA: Diagnosis not present

## 2021-09-27 MED ORDER — CYCLOBENZAPRINE HCL 10 MG PO TABS: 10.0000 mg | ORAL_TABLET | Freq: Three times a day (TID) | ORAL | 0 refills | Status: DC | PRN

## 2021-09-27 NOTE — Patient Instructions (Signed)
You can try following up with an orthopedist if you continue to have pain. Call an office to see if they accept your insurance. If you need a referral that would come from your primary care doctor

## 2021-09-27 NOTE — Progress Notes (Signed)
Symptom Management Consult note [place]    Patient Care Team: Pcp, No as PCP - General [name], MD as Consulting Physician (Oncology) [name], MD as Consulting Physician (General Surgery) [name], NP as Nurse Practitioner (Nurse Practitioner)    Name of the patient: Stephanie [name]  [id number]  02-04-1981   Date of visit: 09/27/2021    Chief complaint/ Reason for visit- back pain  Oncology History Overview Note  Cancer Staging Malignant neoplasm of rectosigmoid junction [facility]) Staging form: Colon and Rectum, AJCC 8th Edition - Pathologic stage from 04/04/2020: pT4a, pN1b, cM1 - Signed by [name], NP on 05/07/2020    Malignant neoplasm of rectosigmoid junction ([place])  11/10/2019 Imaging   MRI Abdomen  IMPRESSION: 1. Redemonstrated hypoenhancing lesion of the posterior liver dome, hepatic segment VII, reduced in size compared to prior examination, measuring 1.3 x 1.3 cm, previously 1.8 x 1.7 cm. Findings are consistent with treatment response of a biopsy proven metastasis. No other evidence of lymphadenopathy or metastatic disease within the abdomen or pelvis. 2. Unchanged mild splenomegaly, maximum coronal span 14.0 cm. 3. 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   03/12/2020 Initial Diagnosis   Malignant neoplasm of rectosigmoid junction ([place])   03/12/2020 Imaging   CT AP with contrast IMPRESSION: 1. Overall findings are highly concerning for colorectal carcinoma involving the sigmoid colon with an associated perforation and adjacent abscess and phlegmon formation as detailed above. Currently, no collection is amenable to percutaneous drainage given their small size and location. 2. New 2 cm mass in the right hepatic lobe concerning for metastatic disease to the liver until proven otherwise. 3. Enlarged regional lymph nodes as detailed above is concerning for nodal metastatic disease. 4. Large  stool burden. 5. Prominent pelvic veins which can be seen in patients with pelvic congestion syndrome.   03/13/2020 Imaging   ABD US IMPRESSION: Approximately 2.1 x 2.4 x 2.0 cm lobular homogeneously echogenic lesion in the right hepatic dome corresponds with the abnormality seen on the prior CT scan. Sonographically, this appearance is highly suggestive of a benign hemangioma.   Recommend MRI of the abdomen with gadolinium contrast which may provide a noninvasive diagnosis of benign hemangioma.    03/13/2020 Imaging   MR ABD W/WO CONTRAST Hepatobiliary: Diffuse low signal intensity throughout the hepatic parenchyma on T2 weighted images, presumably a consequence of recent Feraheme injection. In segment 7 of the liver (axial image 8 of series 5) there is a 2.5 x 1.9 cm well-defined lesion which is slightly T2 hyperintense. This lesion appears hyperintense on pre gadolinium T1 weighted images (likely a consequence of Feraheme). Interpretation of enhancement within the lesion is compromised by presence of Feraheme. No other hepatic lesions are confidently identified on today's examination. No intra or extrahepatic biliary ductal dilatation. Gallbladder is normal in appearance.   03/31/2020 Imaging   CT AP W contrast IMPRESSION: 1. Previously noted sigmoid colon mass appears increased in size, and again appears to be associated with a focal contained perforation which crosses the midline and has fistulized into the left adnexal region where there is now what appears to be a large left tubo-ovarian abscess, as detailed above. This is also associated with multiple enlarged lymph nodes in the pelvis measuring up to 1.2 cm in short axis and borderline enlarged retroperitoneal lymph nodes, concerning for metastatic disease. In addition, previously suspected metastatic lesion in segment 7 of the liver has enlarged. 2. Small volume  of ascites. 3. Additional incidental findings, as above.    04/04/2020 Cancer Staging   Staging form: Colon and Rectum, AJCC 8th Edition - Pathologic stage from 04/04/2020: pT4a, pN1b, cM1 - Signed by [name], NP on 05/07/2020    04/04/2020 Procedure   Paracentesis, path showed no malignant cells (mixed acute and chronic inflammation present)   04/04/2020 Surgery   Open sigmoid colectomy and end colostomy by Dr. [name]   04/04/2020 Pathology Results   FINAL MICROSCOPIC DIAGNOSIS: A. COLON, RECTOSIGMOID, RESECTION: - Invasive moderately differentiated adenocarcinoma, 6 cm, involving rectosigmoid junction - Carcinoma invades into serosal surface with perforation and associated serositis - Radial resection margin is positive for carcinoma; proximal and distal margins are not involved - Lymphovascular invasion is present - Metastatic carcinoma to one of fifteen lymph nodes (1/15); one tumor deposit - See oncology table B. LYMPH NODES, MESENTERIC, RESECTION: - Metastatic adenocarcinoma to one of six lymph nodes (1/6) - One tumor deposit  Addendum to note 2 involved lymph nodes (of 21 examined nodes) pT4a,pN1b MMR-normal, preserved expression of MLH1, MSH2, MSH6, PMS2   04/12/2020 Procedure   PAC placement    04/13/2020 Imaging   CT chest without contrast IMPRESSION: Interval development of bilateral pleural effusions, left slightly greater than right, with resultant bibasilar atelectasis including subtotal collapse of the left lower lobe. No evidence of intrathoracic metastatic disease, though evaluation of the collapsed parenchyma is limited. Hepatic metastasis again demonstrated.     04/16/2020 Pathology Results   FINAL MICROSCOPIC DIAGNOSIS:  A. LIVER, RIGHT LOBE, BIOPSY:  - Adenocarcinoma.  COMMENT:  The morphology is compatible with the provided clinical history of colorectal carcinoma.    05/23/2020 - 10/24/2020 Chemotherapy   FOLFIRINOX q2weeks for 3-6 months starting 05/23/20. Bevacizumab-bvzr [name] added with C2.  Oxaliplatin held with C11-12 due to reaction. (pt developed SOB, chest palpitation and abdominal discomfort shortly after oxaliplatin started). Completed on 10/24/20.   08/05/2020 Imaging   CT AP  IMPRESSION: 1. Postsurgical changes of distal colectomy with a left lower quadrant end ostomy. No evidence of obstruction or acute complication at this time. Excluded rectal pouch in the deep pelvis without acute complication or worrisome features. 2. Slight interval decrease in size of a hypoattenuating lesion posterior right lobe liver measuring 1.7 x 1.8 x 2 cm. This lesion has previously undergone ultrasound-guided biopsy with pathologic results demonstrating adenocarcinoma compatible with metastatic disease from patient's resected colorectal carcinoma. 3. Slight prominence of the parametrial vessels bilaterally, nonspecific though can be seen in the setting of pelvic congestion syndrome. 4. Mild splenomegaly.  No focal lesion.     11/12/2020 Imaging   CT Chest  IMPRESSION: 1. No evidence of metastatic disease in the chest. 2. 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   12/13/2020 Surgery   A. 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 The report date is 01/28/2021.   The CustomNext-Cancer+RNAinsight panel offered by [facility] included sequencing and rearrangement analysis for the following 47 genes:  APC, ATM, AXIN2, BARD1, BMPR1A, BRCA1, BRCA2, BRIP1, CDH1, CDK4, CDKN2A, CHEK2, DICER1, EPCAM, GREM1, HOXB13, MEN1, MLH1, MSH2, MSH3,  MSH6, MUTYH,  NBN, NF1, NF2, NTHL1, PALB2, PMS2, POLD1, POLE, PTEN, RAD51C, RAD51D, RECQL, RET, SDHA, SDHAF2, SDHB, SDHC, SDHD, SMAD4, SMARCA4, STK11, TP53, TSC1, TSC2, and VHL.  RNA data is routinely analyzed for use in variant interpretation for all genes.   02/19/2021 Imaging   CT A/P w/o contrast  IMPRESSION: Slightly limited examination examination in absence of contrast administration. Status post partial right hepatectomy. Interval decrease in size in perihepatic fluid collection and resolution of right subdiaphragmatic fluid and gas. No new intra-abdominal fluid collections are identified.   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 Moderate stool throughout the colon without evidence of obstruction.   Fluid distension of the proximal duodenum to the level of the SMA hiatus which appears narrow. 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   05/05/2021 Imaging   CT AP  IMPRESSION: Postsurgical changes as described stable in appearance from the prior exam.   Changes suggestive of mild pelvic varices.   08/22/2021 Imaging   EXAM: CT ABDOMEN AND PELVIS WITH CONTRAST  IMPRESSION: 1. Extensive bowel content is identified throughout the colon consistent with constipation. 2. 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 3. Stable postoperative changes in the right lobe liver.   09/12/2021 Survivorship   SCP delivered by Santiago Glad, NP     Current Therapy: none. Finished chemotherapy 09/2020  Interval history-  Stephanie [name] is a 40 yo female with oncologic history as stated above presenting to [facility] today with chief complaint of back pain x 1 month. Patient states pain is located on both left and right sides of lower back and radiates across. Pain is intermittent. She states today she was  at a therapy session with her daughter and felt extremely anxious which made her back pain worse.  She has been taking tylenol and using a heating pad with minimal improvement. She states pain was worse around her menstrual cycle which was x 2 weeks ago.She has history of ovarian cyst she is being followed for by gyn, has upcoming appointment with them in the beginning of next month. She admits to history of frequent UTIs, last in 10/22 however does not have any urinary symptoms currently. She is currently on a probiotic for to hopefully avoid UTIs. She admits to increasing her workout regimen and has been running on the treadmill frequently. She states her bowel movements have been normal and she typically goes daily. She is currently taking miralax and fiber supplements daily. She denies any fall or injury to her back. Denies fever, chills, abdominal pain or bloating, nausea, emesis, unintentional weight loss, jaundice, cough, chest pain, shortness of breath, vaginal discharge, dyspareunia, abnormal vaginal bleeding, rash, numbness, tingling, weakness.    ROS  All other systems are reviewed and are negative for acute change except as noted in the HPI.    Allergies  Allergen Reactions   Oxaliplatin Shortness Of Breath     Past Medical History:  Diagnosis Date   Anxiety    Blood transfusion without reported diagnosis    Bowel obstruction (HCC)    BV (bacterial vaginosis)    Colon cancer [facility])    Colon Cancer 03-2020   Colon polyps    Colostomy present [facility])    03-2020   Family history of bladder cancer    History of chemotherapy    ended 09-2020   Lactose intolerance 03/12/2020  Neuromuscular disorder (HCC)    neuropathy feet hands legs   UTI (lower urinary tract infection)      Past Surgical History:  Procedure Laterality Date   CESAREAN SECTION     CHOLECYSTECTOMY  12/13/2020   COLECTOMY  03/2020   COLONOSCOPY     COLOSTOMY TAKEDOWN N/A 05/30/2021   Procedure: LAPAROSCOPIC  ASSISTED COLOSTOMY TAKEDOWN;  Surgeon: [name], MD;  Location: [facility];  Service: General;  Laterality: N/A;   CYSTOSCOPY WITH STENT PLACEMENT  04/04/2020   Procedure: CYSTOSCOPY WITH STENT PLACEMENT;  Surgeon: [name], MD;  Location: [facility];  Service: General;;   LAPAROSCOPIC LIVER ULTRASOUND N/A 12/13/2020   Procedure: INTRAOPERATIVE LIVER ULTRASOUND;  Surgeon: [name], MD;  Location: [place] [facility];  Service: General;  Laterality: N/A;   LAPAROSCOPY N/A 12/13/2020   Procedure: LAPAROSCOPY DIAGNOSTIC;  Surgeon: [name], MD;  Location: [place] OR;  Service: General;  Laterality: N/A;   LAPAROTOMY N/A 04/04/2020   Procedure: EXPLORATORY LAPAROTOMY;  Surgeon: [name], MD;  Location: [facility];  Service: General;  Laterality: N/A;   OPEN PARTIAL HEPATECTOMY  N/A 12/13/2020   Procedure: OPEN PARTIAL HEPATECTOMY;  Surgeon: [name], MD;  Location: [place];  Service: General;  Laterality: N/A;  ROOM 2 STARTING AT 09:30AM FOR 300 MIN   PORTACATH PLACEMENT Right 04/12/2020   Procedure: INSERTION PORT-A-CATH WITH ULTRASOUND;  Surgeon: Kinsinger, Arta Bruce, MD;  Location: [facility];  Service: General;  Laterality: Right;    Social History   Socioeconomic History   Marital status: Single    Spouse name: Not on file   Number of children: 1   Years of education: Not on file   Highest education level: Not on file  Occupational History   Occupation: hair stylist  Tobacco Use   Smoking status: Former    Packs/day: 0.50    Years: 10.00    Pack years: 5.00    Types: Cigarettes    Quit date: 2018    Years since quitting: 4.9   Smokeless tobacco: Never  Vaping Use   Vaping Use: Never used  Substance and Sexual Activity   Alcohol use: Not Currently   Drug use: No   Sexual activity: Yes    Partners: Male    Birth control/protection: OCP  Other Topics Concern   Not on file  Social History Narrative   Not on file   Social Determinants of Health   Financial Resource Strain: Not  on file  Food Insecurity: Not on file  Transportation Needs: Not on file  Physical Activity: Not on file  Stress: Not on file  Social Connections: Not on file  Intimate Partner Violence: Not on file    Family History  Problem Relation Age of Onset   Irritable bowel syndrome Mother    Heart disease Father    Stroke Father    Aneurysm Father    Irritable bowel syndrome Brother    Other Brother        gluten intolerance   Bladder Cancer Maternal Grandmother        dx 90s   Colon cancer Neg Hx    Esophageal cancer Neg Hx    Colon polyps Neg Hx    Stomach cancer Neg Hx    Rectal cancer Neg Hx      Current Outpatient Medications:    cyclobenzaprine (FLEXERIL) 10 MG tablet, Take 1 tablet (10 mg total) by mouth 3 (three) times daily as needed for muscle spasms., Disp: 30 tablet, Rfl:  0   acetaminophen (TYLENOL) 325 MG tablet, Take 2 tablets (650 mg total) by mouth every 6 (six) hours as needed for mild pain, moderate pain, fever or headache (fever > 101)., Disp: , Rfl:    ibuprofen (ADVIL) 200 MG tablet, Take 400 mg by mouth every 6 (six) hours as needed for headache, fever or mild pain., Disp: , Rfl:    Lactic Ac-Citric Ac-Pot Bitart (PHEXXI) 1.8-1-0.4 % GEL, Place 5 g vaginally as needed. (Patient not taking: Reported on 08/22/2021), Disp: 60 g, Rfl: 1   meloxicam (MOBIC) 7.5 MG tablet, Take 1 tablet (7.5 mg total) by mouth daily. (Patient not taking: Reported on 08/22/2021), Disp: 20 tablet, Rfl: 0   Norethindrone Acetate-Ethinyl Estradiol (LOESTRIN 1.5/30, 21,) 1.5-30 MG-MCG tablet, Take 1 tablet by mouth daily., Disp: 28 tablet, Rfl: 6   Prenatal Vit-Fe Fumarate-FA (PRENATAL MULTIVITAMIN) TABS tablet, Take 1 tablet by mouth daily at 12 noon., Disp: , Rfl:   PHYSICAL EXAM: ECOG FS:1 - Symptomatic but completely ambulatory    Vitals:   09/27/21 1407  BP: 120/77  Pulse: (!) 57  Resp: 17  Temp: 98.2 F (36.8 C)  TempSrc: Oral  SpO2: 100%  Weight: 147 lb 6.4 oz (66.9 kg)    Physical Exam Vitals and nursing note reviewed.  Constitutional:      Appearance: She is well-developed. She is not ill-appearing or toxic-appearing.  HENT:     Head: Normocephalic and atraumatic.     Nose: Nose normal.  Eyes:     General: No scleral icterus.       Right eye: No discharge.        Left eye: No discharge.     Conjunctiva/sclera: Conjunctivae normal.  Neck:     Vascular: No JVD.  Cardiovascular:     Rate and Rhythm: Normal rate and regular rhythm.     Pulses: Normal pulses.     Heart sounds: Normal heart sounds.  Pulmonary:     Effort: Pulmonary effort is normal.     Breath sounds: Normal breath sounds.  Abdominal:     General: There is no distension.  Musculoskeletal:        General: Normal range of motion.     Cervical back: Normal range of motion.       Back:     Right lower leg: No edema.     Left lower leg: No edema.     Comments: No overlying skin changes seen on back.  No midline spinal tenderness, no step off or deformity of cervical, thoracic or lumbar spine  Skin:    General: Skin is warm and dry.  Neurological:     Mental Status: She is oriented to person, place, and time.     GCS: GCS eye subscore is 4. GCS verbal subscore is 5. GCS motor subscore is 6.     Comments: Fluent speech, no facial droop.  Sensation grossly intact to light touch in the lower extremities bilaterally. No saddle anesthesias. Strength 5/5 with flexion and extension at the bilateral hips, knees, and ankles. No noted gait deficit. Coordination intact with heel to shin testing.    Psychiatric:        Mood and Affect: Mood is anxious. Affect is tearful.        Behavior: Behavior normal.       LABORATORY DATA: I have reviewed the data as listed CBC Latest Ref Rng & Units 09/12/2021 08/22/2021 08/02/2021  WBC 4.0 - 10.5 K/uL 5.9 3.6(L) 4.7  Hemoglobin 12.0 - 15.0 g/dL 14.0 13.5 12.9  Hematocrit 36.0 - 46.0 % 41.5 39.9 39.3  Platelets 150 - 400 K/uL 127(L) 96(L)  111(L)     CMP Latest Ref Rng & Units 09/12/2021 08/22/2021 08/02/2021  Glucose 70 - 99 mg/dL 91 85 97  BUN 6 - 20 mg/dL $Remove'11 13 15  'AiJBhSX$ Creatinine 0.44 - 1.00 mg/dL 0.73 0.56 0.59  Sodium 135 - 145 mmol/L 140 139 140  Potassium 3.5 - 5.1 mmol/L 4.1 4.0 4.2  Chloride 98 - 111 mmol/L 108 108 110  CO2 22 - 32 mmol/L $RemoveB'24 25 24  'YCqZlJQt$ Calcium 8.9 - 10.3 mg/dL 8.8(L) 8.8(L) 8.5(L)  Total Protein 6.5 - 8.1 g/dL 6.8 6.3(L) 6.1(L)  Total Bilirubin 0.3 - 1.2 mg/dL 0.8 0.6 0.3  Alkaline Phos 38 - 126 U/L 52 44 78  AST 15 - 41 U/L $Remo'15 17 16  'OTnGz$ ALT 0 - 44 U/L $Remo'15 15 18       'pQOVH$ RADIOGRAPHIC STUDIES: I have personally reviewed the radiological images as listed and agreed with the findings in the report. No images are attached to the encounter. No results found.   ASSESSMENT & PLAN: Patient is a 40 y.o. female with history of adenocarcinoma of rectosigmoid colon who has finished chemotherapy and was followed by oncologist Dr. Burr Medico.  #)Back pain- Patient is well appearing. She is anxious and tearful. Normal neuro exam of lower extremities. Back pain present after increasing running routine. Pain is reproducible on exam. I viewed image from ED visit 11/24. CT AP shows mild degenerative joint changes of lower lumbar spine. This is consistent with location of pain. Discussed that with patient and recommended OTC interventions and to try more low impact exercises while pain is present and cut back on running. Patient is very anxious this is disease recurrence, however feel that is less likely based on HPI and exam today. Patient does not have concerning symptoms. Recommend she keep follow up with gyn for further evaluation of ovarian cyst. Also recommend ortho evaluation if pain persists. Patient prescribed muscle relaxer at her request. Advised not to drive or work while taking as it can make her drowsy. Discussed strict ED precautions. Patient agreeable with plan of care.   Visit Diagnosis: 1. Acute bilateral low back  pain without sciatica      No orders of the defined types were placed in this encounter.   All questions were answered. The patient knows to call the clinic with any problems, questions or concerns. No barriers to learning was detected.  I have spent a total of 25 minutes minutes of face-to-face and non-face-to-face time, preparing to see the patient, obtaining and/or reviewing separately obtained history, performing a medically appropriate examination, counseling and educating the patient,  documenting clinical information in the electronic health record, and care coordination.     Thank you for allowing me to participate in the care of this patient.    Barrie Folk, PA-C Department of Hematology/Oncology [facility] at [facility] Phone: [phone]  [phone]    09/27/2021 5:00 PM

## 2021-10-04 ENCOUNTER — Other Ambulatory Visit: Payer: Self-pay

## 2021-10-04 ENCOUNTER — Encounter (HOSPITAL_COMMUNITY): Payer: Self-pay

## 2021-10-04 ENCOUNTER — Emergency Department (HOSPITAL_COMMUNITY): Payer: Medicaid Other

## 2021-10-04 ENCOUNTER — Emergency Department (HOSPITAL_COMMUNITY)
Admission: EM | Admit: 2021-10-04 | Discharge: 2021-10-05 | Disposition: A | Discharge status: Home / Self Care | Payer: Medicaid Other | Attending: Emergency Medicine | Admitting: Emergency Medicine

## 2021-10-04 DIAGNOSIS — R109 Unspecified abdominal pain: Secondary | ICD-10-CM | POA: Diagnosis not present

## 2021-10-04 DIAGNOSIS — M545 Low back pain, unspecified: Secondary | ICD-10-CM

## 2021-10-04 DIAGNOSIS — N83201 Unspecified ovarian cyst, right side: Secondary | ICD-10-CM | POA: Insufficient documentation

## 2021-10-04 DIAGNOSIS — Z85038 Personal history of other malignant neoplasm of large intestine: Secondary | ICD-10-CM | POA: Diagnosis not present

## 2021-10-04 DIAGNOSIS — N83202 Unspecified ovarian cyst, left side: Secondary | ICD-10-CM | POA: Diagnosis not present

## 2021-10-04 IMAGING — US US PELVIS COMPLETE
1 series · 14 of 25 positions shown · non-contrast
Comparison: [DATE], [DATE]

CLINICAL DATA: Pelvic pain, bleeding, history of colon cancer

EXAM:
TRANSABDOMINAL AND TRANSVAGINAL ULTRASOUND OF PELVIS
TECHNIQUE: Both transabdominal and transvaginal ultrasound examinations of the
pelvis were performed. Transabdominal technique was performed for
global imaging of the pelvis including uterus, ovaries, adnexal
regions, and pelvic cul-de-sac. It was necessary to proceed with
endovaginal exam following the transabdominal exam to visualize the
endometrium and adnexal structures.

[Series 1: us pelvis complete mc & wl · 102 acquisitions, 14 frames shown]
[im 1/102]
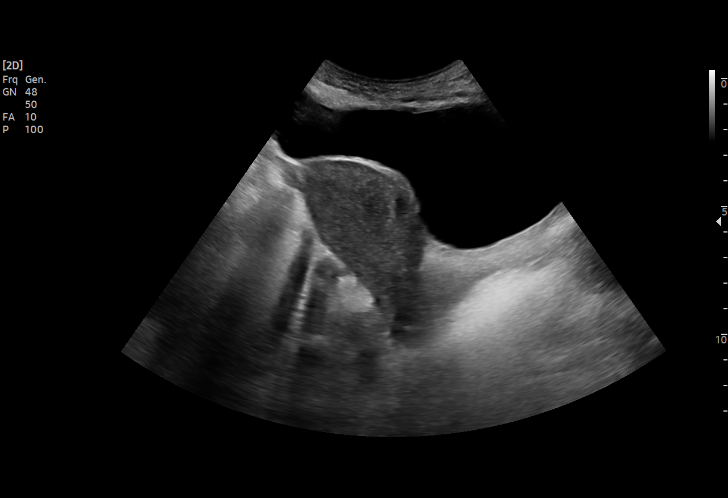
[im 9/102]
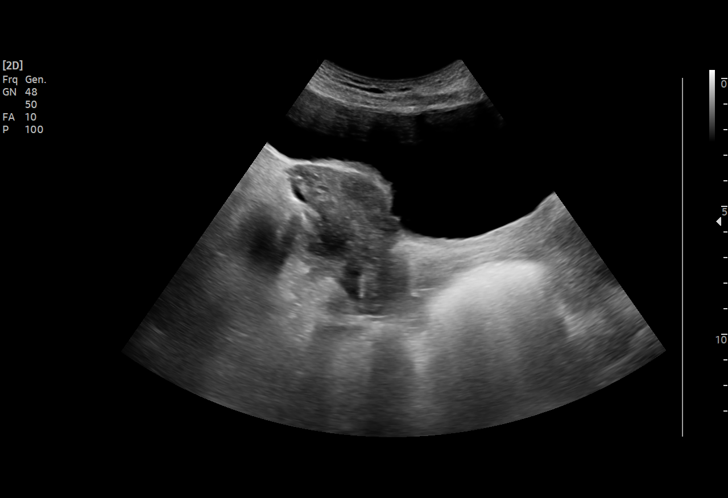
[im 17/102]
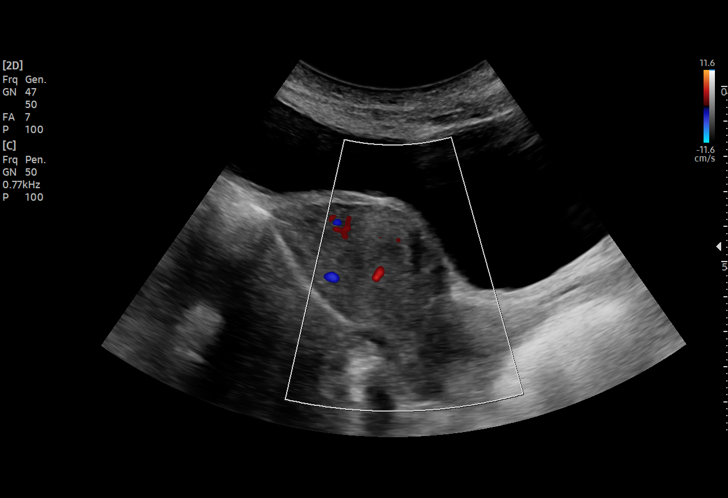
[im 26/102]
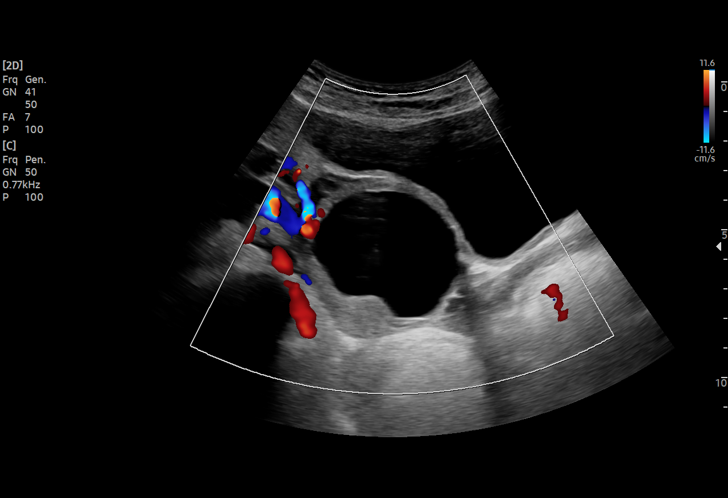
[im 34/102]
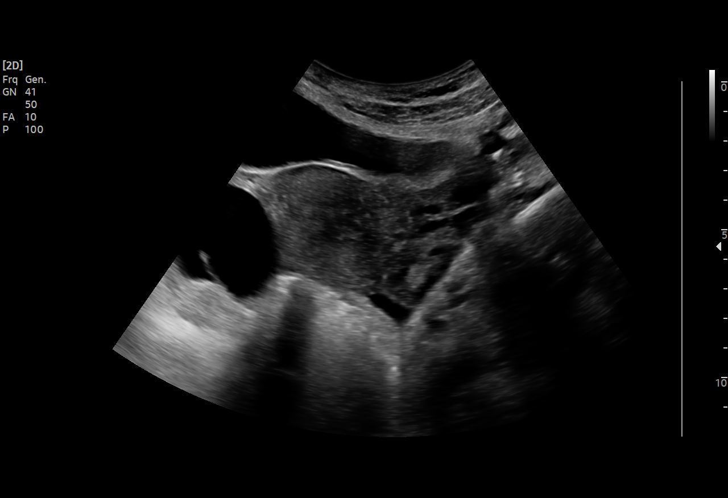
[im 38/102]
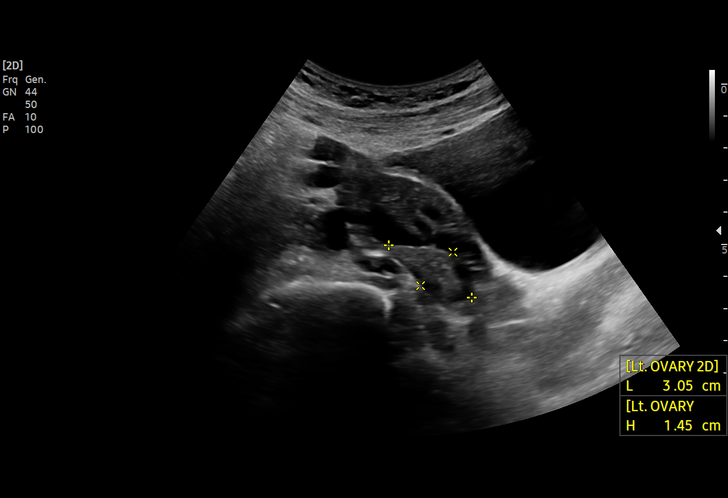
[im 47/102]
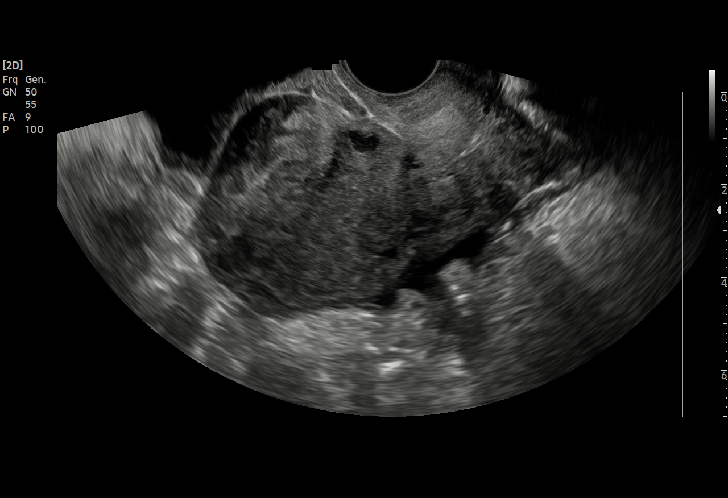
[im 55/102]
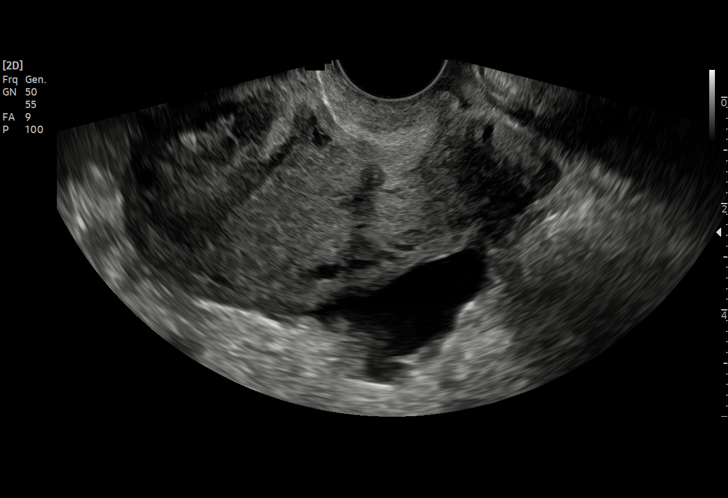
[im 64/102]
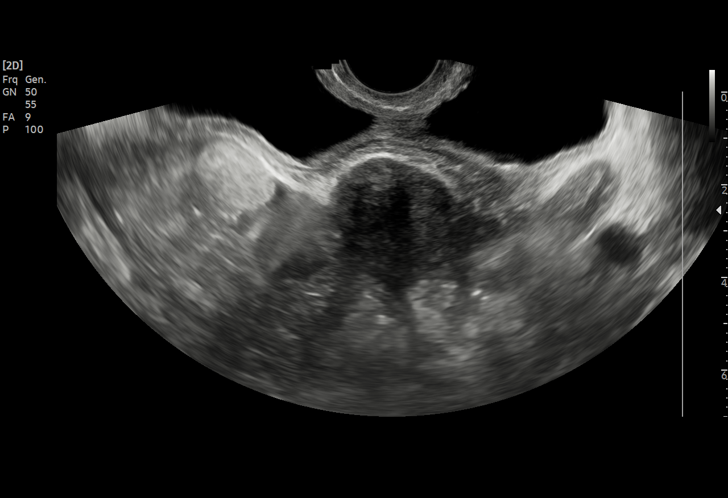
[im 68/102]
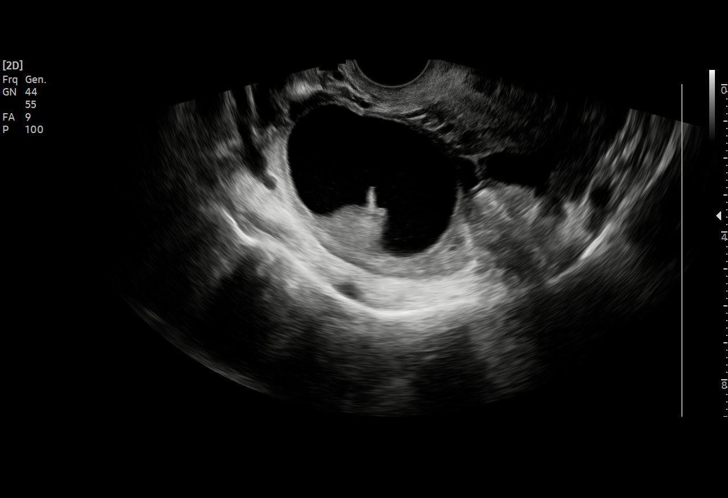
[im 76/102]
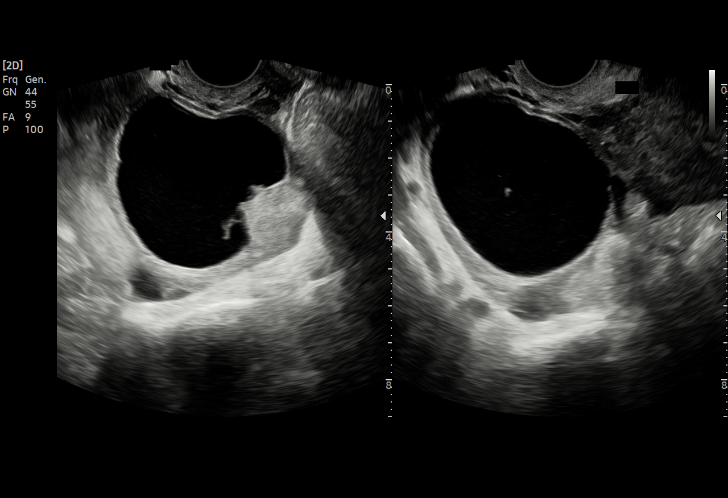
[im 85/102]
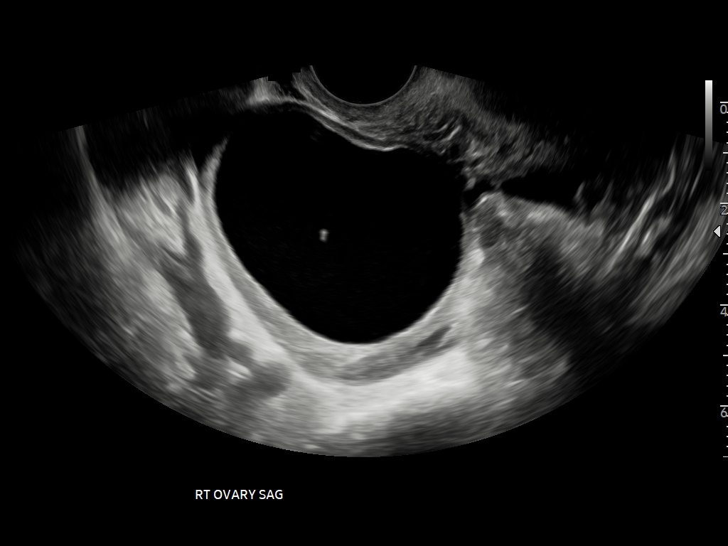
[im 93/102]
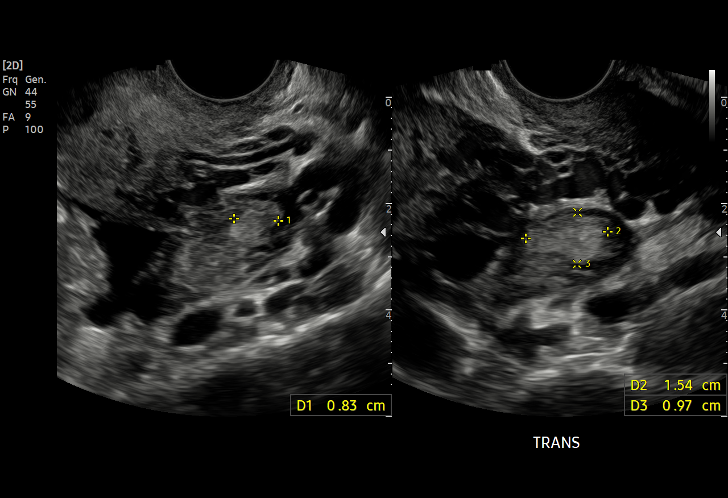
[im 102/102]
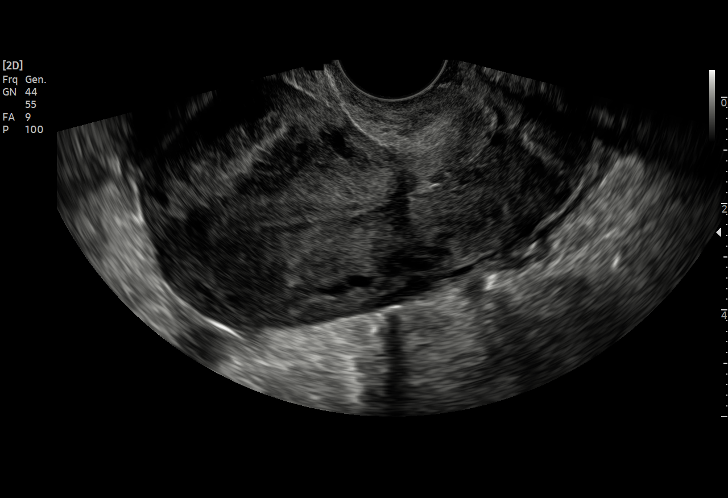

[14 of 25 positions shown; findings below may reference images not displayed]

FINDINGS: Uterus

Measurements: 7.9 x 4.8 by 5.9 cm = volume: 116.7 mL. Uterus is
heterogeneous, with prominent intramural fibroid seen in the ventral
aspect of the room body measuring 2.6 x 2.1 x 2.4 cm.

Endometrium

Thickness: 3 mm.  No focal abnormality visualized.

Right ovary

Measurements: 6.6 x 5.1 by 5.9 cm = volume: 105.5 mL. Since the
prior CT, a complex right ovarian cyst has developed measuring 5.0 x
5.7 x 4.2 cm. Thickened septation is seen, with a mural nodule
measuring 2.3 x 1.5 x 2.2 cm.

Left ovary

Measurements: 3.5 x 1.5 by 2.5 cm = volume: 6.8 mL. Nonspecific
hyperechoic region within the left ovary measuring 1.5 x 1.0 x
cm could reflect a hemorrhagic cyst.

Other findings

Trace pelvic free fluid within the cul-de-sac.
IMPRESSION: 1. Uterine fibroid.
2. Normal appearance of the endometrium. If bleeding remains
unresponsive to hormonal or medical therapy, sonohysterogram should
be considered for focal lesion work-up. (Ref: Radiological
Reasoning: Algorithmic Workup of Abnormal Vaginal Bleeding with
Endovaginal Sonography and Sonohysterography. AJR [L0]; 191:S68-73)
3. Complex right ovarian cyst as above, with thickened septation and
mural nodule. No abnormality was identified in the right adnexa on
recent CT dated [DATE]. Follow-up ultrasound in 6-8 weeks is
recommended to assess for interval resolution. If the abnormality
persists at that time, or if more urgent evaluation is desired, an
outpatient pelvic MRI with without contrast could be performed.
4. Likely small hemorrhagic cyst left ovary. This could also be
reassessed at the time of follow-up.

## 2021-10-04 IMAGING — US US TRANSVAGINAL NON-OB
1 series · 14 of 25 positions shown · non-contrast
Comparison: [DATE], [DATE]

CLINICAL DATA: Pelvic pain, bleeding, history of colon cancer

EXAM:
TRANSABDOMINAL AND TRANSVAGINAL ULTRASOUND OF PELVIS
TECHNIQUE: Both transabdominal and transvaginal ultrasound examinations of the
pelvis were performed. Transabdominal technique was performed for
global imaging of the pelvis including uterus, ovaries, adnexal
regions, and pelvic cul-de-sac. It was necessary to proceed with
endovaginal exam following the transabdominal exam to visualize the
endometrium and adnexal structures.

[Series 1: us pelvis complete mc & wl · 102 acquisitions, 14 frames shown]
[im 1/102]
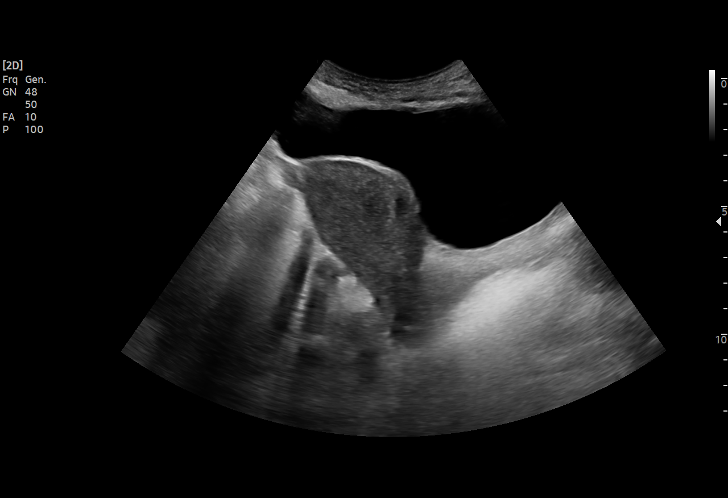
[im 9/102]
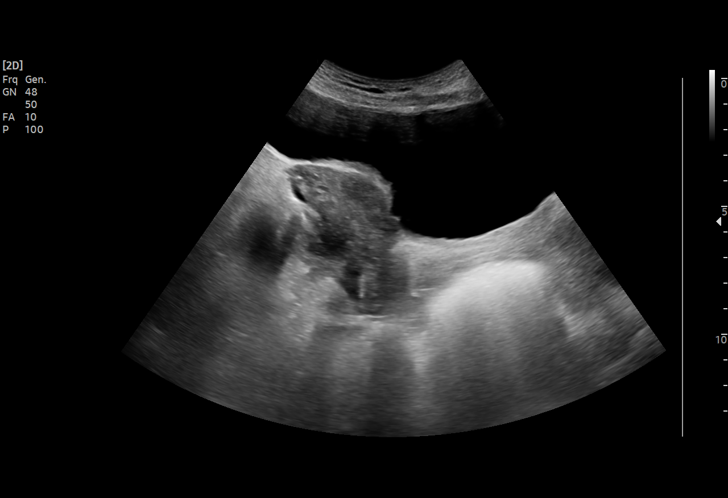
[im 17/102]
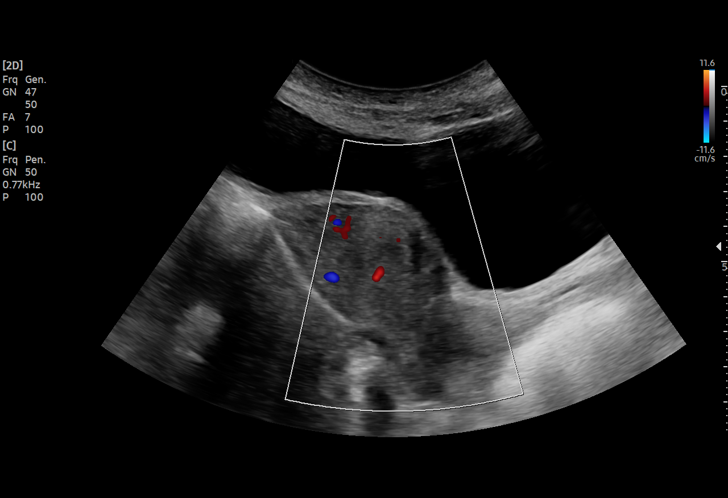
[im 26/102]
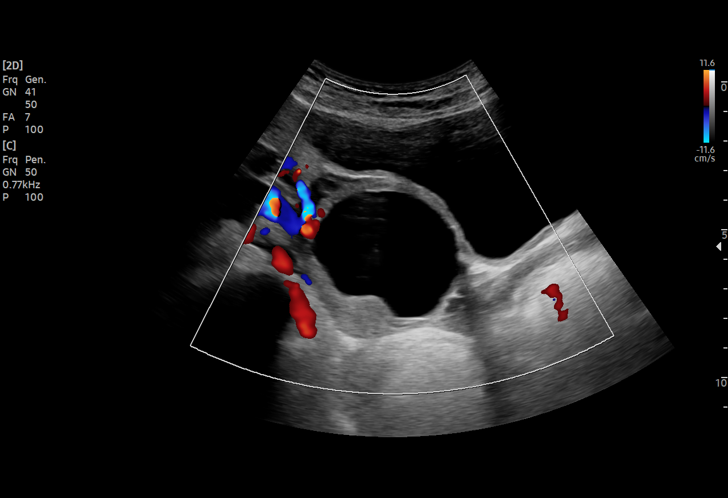
[im 34/102]
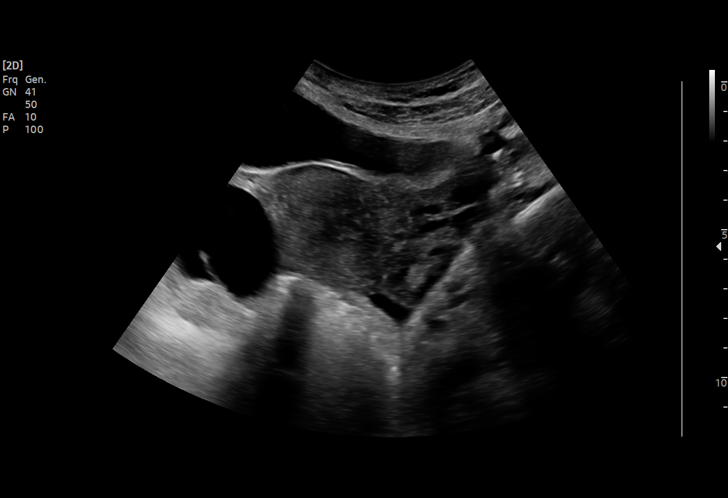
[im 38/102]
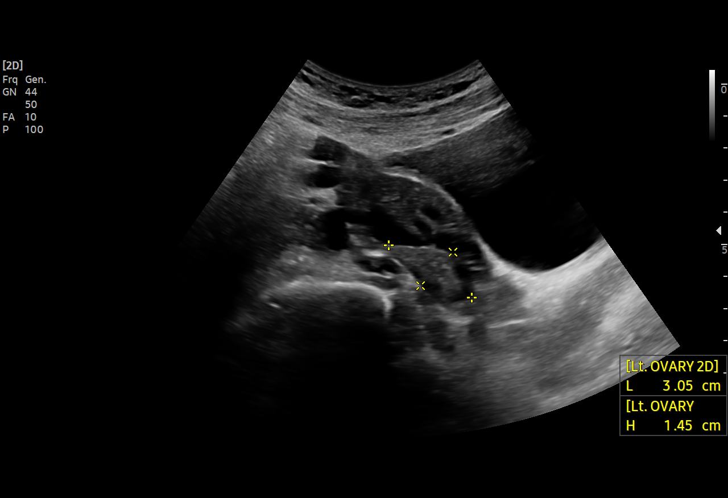
[im 47/102]
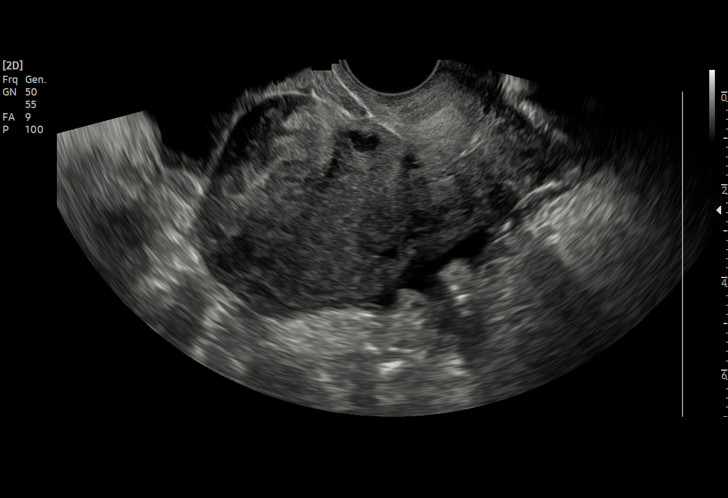
[im 55/102]
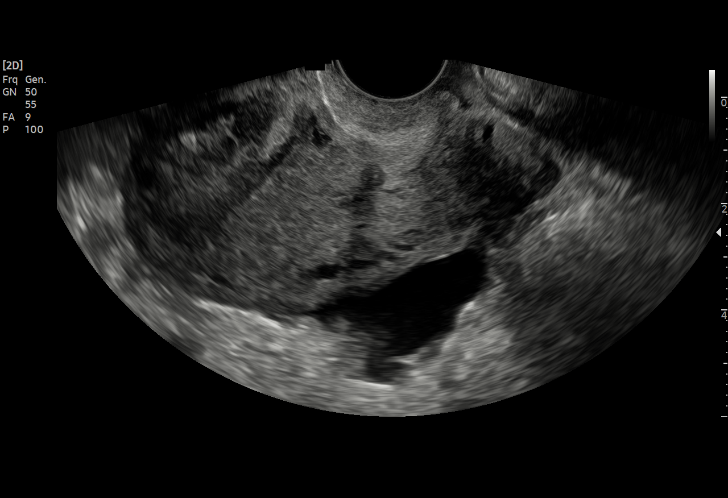
[im 64/102]
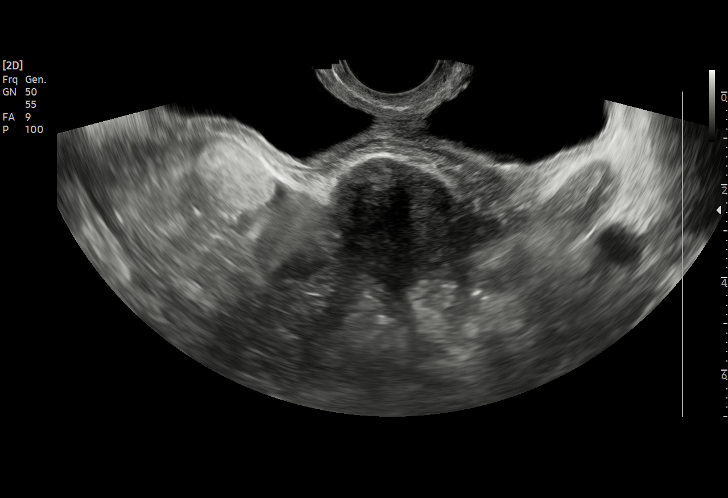
[im 68/102]
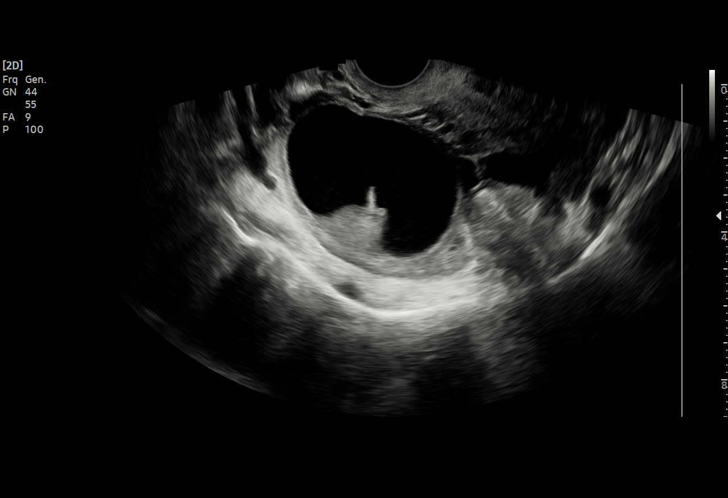
[im 76/102]
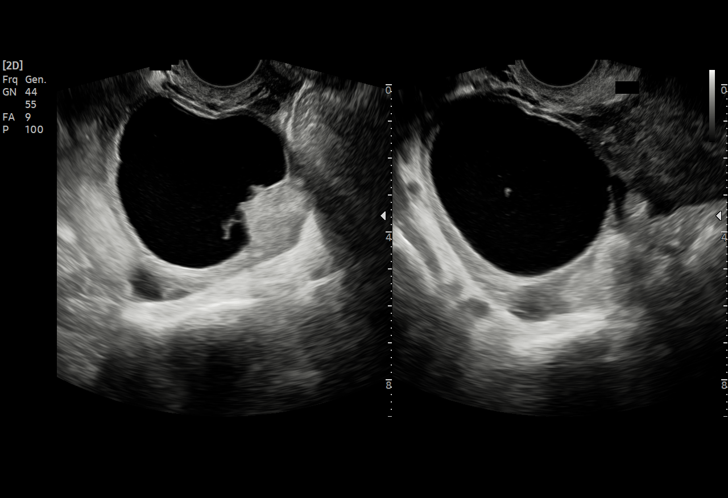
[im 85/102]
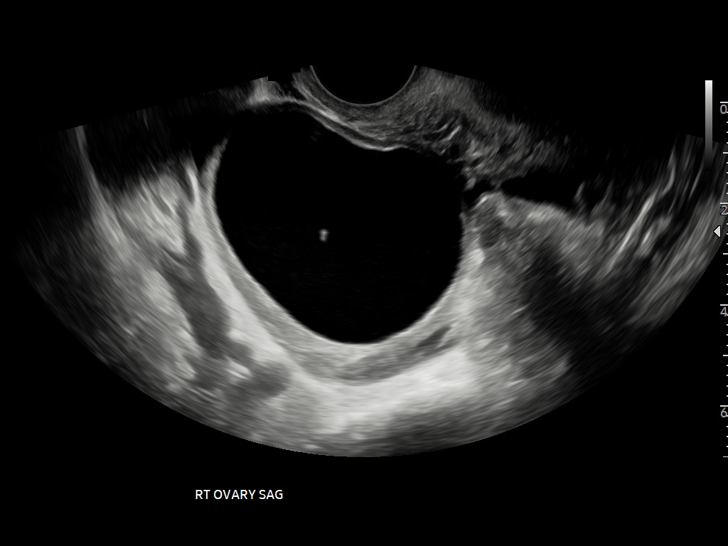
[im 93/102]
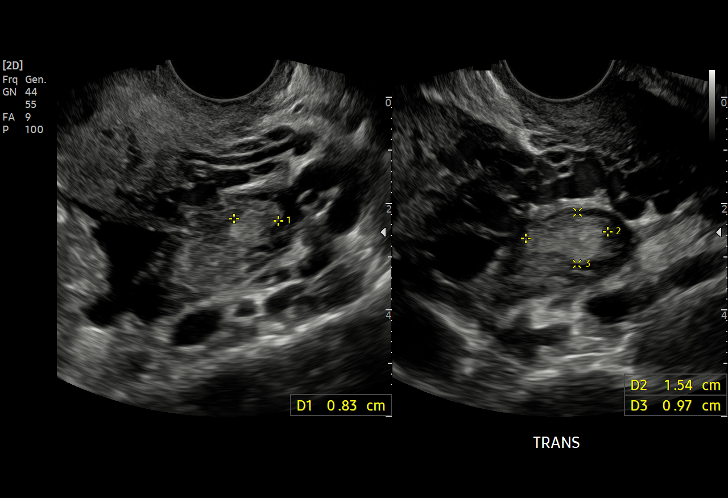
[im 102/102]
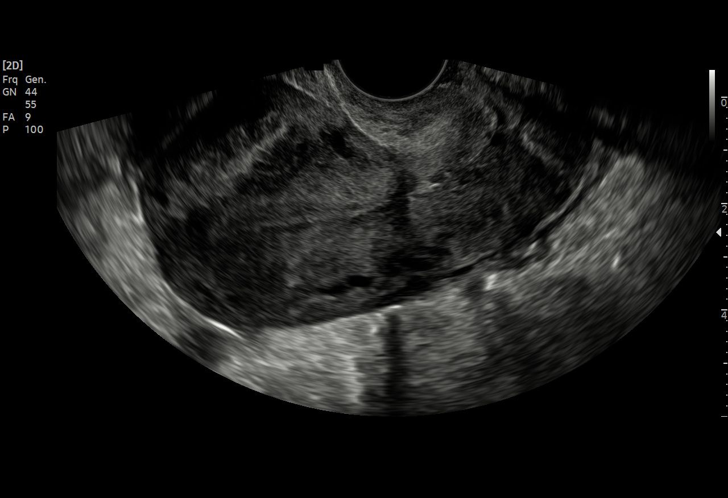

[14 of 25 positions shown; findings below may reference images not displayed]

FINDINGS: Uterus

Measurements: 7.9 x 4.8 by 5.9 cm = volume: 116.7 mL. Uterus is
heterogeneous, with prominent intramural fibroid seen in the ventral
aspect of the room body measuring 2.6 x 2.1 x 2.4 cm.

Endometrium

Thickness: 3 mm.  No focal abnormality visualized.

Right ovary

Measurements: 6.6 x 5.1 by 5.9 cm = volume: 105.5 mL. Since the
prior CT, a complex right ovarian cyst has developed measuring 5.0 x
5.7 x 4.2 cm. Thickened septation is seen, with a mural nodule
measuring 2.3 x 1.5 x 2.2 cm.

Left ovary

Measurements: 3.5 x 1.5 by 2.5 cm = volume: 6.8 mL. Nonspecific
hyperechoic region within the left ovary measuring 1.5 x 1.0 x
cm could reflect a hemorrhagic cyst.

Other findings

Trace pelvic free fluid within the cul-de-sac.
IMPRESSION: 1. Uterine fibroid.
2. Normal appearance of the endometrium. If bleeding remains
unresponsive to hormonal or medical therapy, sonohysterogram should
be considered for focal lesion work-up. (Ref: Radiological
Reasoning: Algorithmic Workup of Abnormal Vaginal Bleeding with
Endovaginal Sonography and Sonohysterography. AJR [L0]; 191:S68-73)
3. Complex right ovarian cyst as above, with thickened septation and
mural nodule. No abnormality was identified in the right adnexa on
recent CT dated [DATE]. Follow-up ultrasound in 6-8 weeks is
recommended to assess for interval resolution. If the abnormality
persists at that time, or if more urgent evaluation is desired, an
outpatient pelvic MRI with without contrast could be performed.
4. Likely small hemorrhagic cyst left ovary. This could also be
reassessed at the time of follow-up.

## 2021-10-04 NOTE — ED Triage Notes (Addendum)
Patient c/o left lower back pain x 1 month. Patient states for the past week the pain has worsened. Patient states the pain has worsened in the past week. Patient states she has had constant spotting and states she began birth control pills at the beginning of November. Patient reports a history of ovarian cysts and a uterine fibroid.

## 2021-10-04 NOTE — ED Provider Triage Note (Signed)
Emergency Medicine Provider Triage Evaluation Note  Stephanie [name] , a 41 y.o. female  was evaluated in triage.  Pt complains of 3 weeks of severe back pain and cramping.  Has a history of colon cancer in remission for a year.  She knows that she has multiple uterine fibroids and cysts, and is not able to get in with her GYN until next week despite 3 weeks of pain.  Primary care gave her tramadol which is not helping her.  Describes the pain as if a knife is stabbing and twisting in her lower back.  Worse on the left side.  Last menstrual period 2 and half weeks ago however this morning she began to bleed dark red blood again.  Review of Systems  Positive: Back pain, abdominal cramping Negative: Dysuria, hematuria, diarrhea or constipation  Physical Exam  BP 117/77    Pulse 75    Temp 98.2 F (36.8 C) (Oral)    Resp 16    Ht 5\' 8"  (1.727 m)    Wt 67.1 kg    LMP 10/04/2021    SpO2 100%    BMI 22.50 kg/m  Gen:   Awake, no distress   Resp:  Normal effort  MSK:   Moves extremities without difficulty  Other:  Unable to elicit tenderness on palpation of the lumbar spine.  Pelvic tenderness.  Medical Decision Making  Medically screening exam initiated at 5:42 PM.  Appropriate orders placed.  Terre Hanneman Weiss was informed that the remainder of the evaluation will be completed by another provider, this initial triage assessment does not replace that evaluation, and the importance of remaining in the ED until their evaluation is complete.     Rhae Hammock, PA-C 10/04/21 1744

## 2021-10-05 ENCOUNTER — Emergency Department (HOSPITAL_COMMUNITY): Payer: Medicaid Other

## 2021-10-05 LAB — COMPREHENSIVE METABOLIC PANEL
ALT: 15 U/L (ref 0–44)
AST: 14 U/L — ABNORMAL LOW (ref 15–41)
Albumin: 3.7 g/dL (ref 3.5–5.0)
Alkaline Phosphatase: 38 U/L (ref 38–126)
Anion gap: 4 — ABNORMAL LOW (ref 5–15)
BUN: 12 mg/dL (ref 6–20)
CO2: 25 mmol/L (ref 22–32)
Calcium: 8.5 mg/dL — ABNORMAL LOW (ref 8.9–10.3)
Chloride: 105 mmol/L (ref 98–111)
Creatinine, Ser: 0.69 mg/dL (ref 0.44–1.00)
GFR, Estimated: >60 mL/min (ref >60)
Glucose, Bld: 85 mg/dL (ref 70–99)
Potassium: 3.6 mmol/L (ref 3.5–5.1)
Sodium: 134 mmol/L — ABNORMAL LOW (ref 135–145)
Total Bilirubin: 0.7 mg/dL (ref 0.3–1.2)
Total Protein: 6.4 g/dL — ABNORMAL LOW (ref 6.5–8.1)

## 2021-10-05 LAB — URINALYSIS, ROUTINE W REFLEX MICROSCOPIC
Bilirubin Urine: NEGATIVE
Glucose, UA: NEGATIVE mg/dL
Ketones, ur: NEGATIVE mg/dL
Nitrite: NEGATIVE
Protein, ur: NEGATIVE mg/dL
Specific Gravity, Urine: 1.005 (ref 1.005–1.030)
pH: 7 (ref 5.0–8.0)

## 2021-10-05 LAB — CBC WITH DIFFERENTIAL/PLATELET
Abs Immature Granulocytes: 0.01 10*3/uL (ref 0.00–0.07)
Basophils Absolute: 0 10*3/uL (ref 0.0–0.1)
Basophils Relative: 0 %
Eosinophils Absolute: 0.1 10*3/uL (ref 0.0–0.5)
Eosinophils Relative: 1 %
HCT: 44 % (ref 36.0–46.0)
Hemoglobin: 14.8 g/dL (ref 12.0–15.0)
Immature Granulocytes: 0 %
Lymphocytes Relative: 22 %
Lymphs Abs: 1 10*3/uL (ref 0.7–4.0)
MCH: 32.4 pg (ref 26.0–34.0)
MCHC: 33.6 g/dL (ref 30.0–36.0)
MCV: 96.3 fL (ref 80.0–100.0)
Monocytes Absolute: 0.3 10*3/uL (ref 0.1–1.0)
Monocytes Relative: 7 %
Neutro Abs: 3.3 10*3/uL (ref 1.7–7.7)
Neutrophils Relative %: 70 %
Platelets: 103 10*3/uL — ABNORMAL LOW (ref 150–400)
RBC: 4.57 MIL/uL (ref 3.87–5.11)
RDW: 13.1 % (ref 11.5–15.5)
WBC: 4.8 10*3/uL (ref 4.0–10.5)
nRBC: 0 % (ref 0.0–0.2)

## 2021-10-05 LAB — PREGNANCY, URINE: Preg Test, Ur: NEGATIVE

## 2021-10-05 LAB — WET PREP, GENITAL
Clue Cells Wet Prep HPF POC: NONE SEEN
Sperm: NONE SEEN
Trich, Wet Prep: NONE SEEN
WBC, Wet Prep HPF POC: <10 (ref <10)
Yeast Wet Prep HPF POC: NONE SEEN

## 2021-10-05 IMAGING — CT CT ABD-PELV W/ CM
2 of 5 series · 15 of 46 positions shown, 17 images · IV contrast (omnipaque)
Comparison: Multiple prior CTs, most recent dated [DATE].

CLINICAL DATA: Left lower quadrant abdominal pain and left low back
pain. History of colon carcinoma.

EXAM:
CT ABDOMEN AND PELVIS WITH CONTRAST
TECHNIQUE: Multidetector CT imaging of the abdomen and pelvis was performed
using the standard protocol following bolus administration of
intravenous contrast.
CONTRAST:  80mL OMNIPAQUE IOHEXOL 350 MG/ML SOLN

[Series 2: axial st · axial · 0.74mm/px · z∈[+1001,+1421]mm · 12 of 98 slices shown, 14 images]
[im 7/98  soft-tissue]
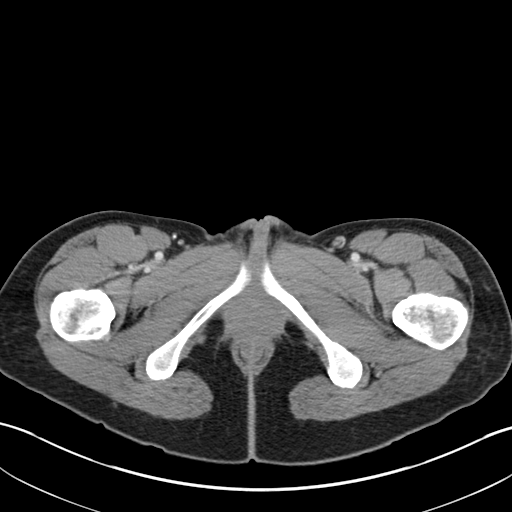
[im 7/98  bone]
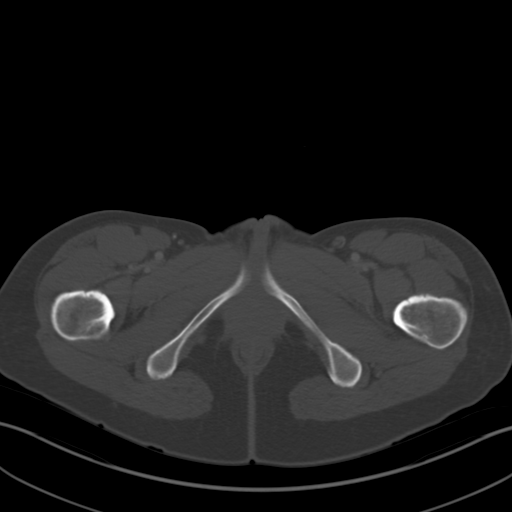
[im 13/98  soft-tissue]
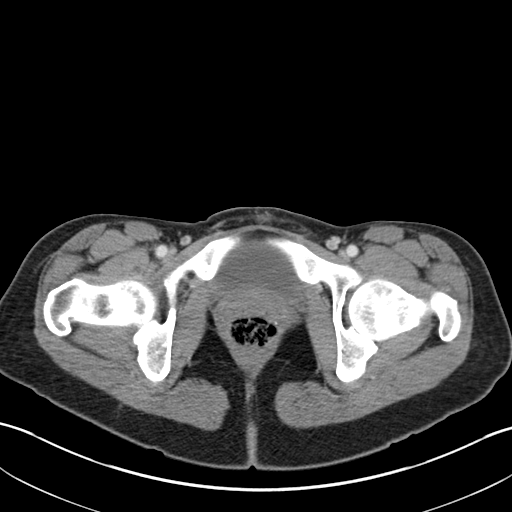
[im 20/98  soft-tissue]
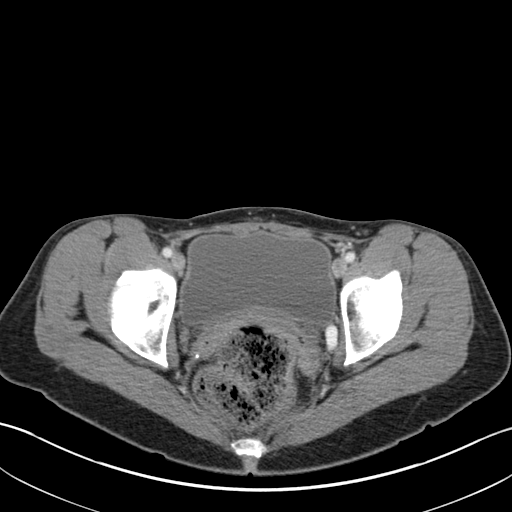
[im 33/98  soft-tissue]
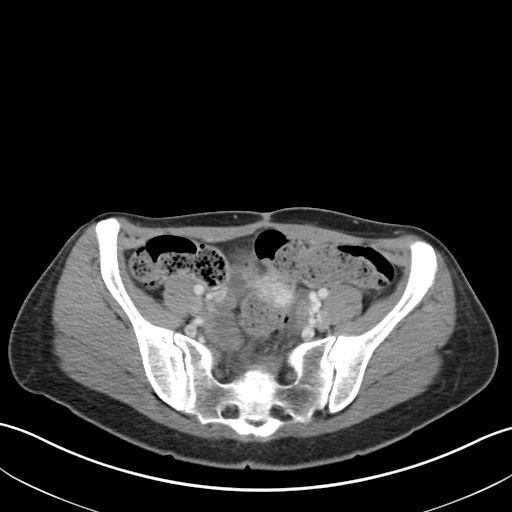
[im 39/98  soft-tissue]
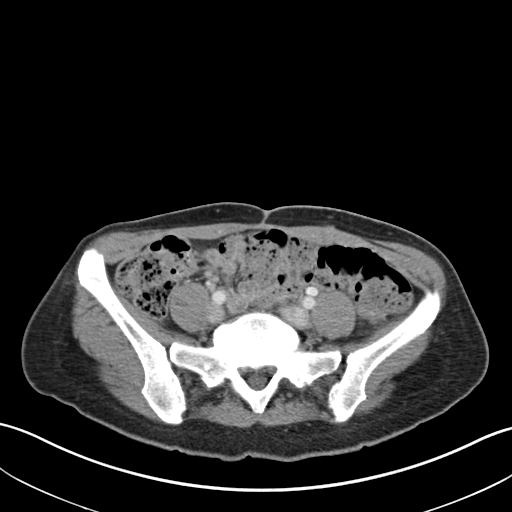
[im 46/98  soft-tissue]
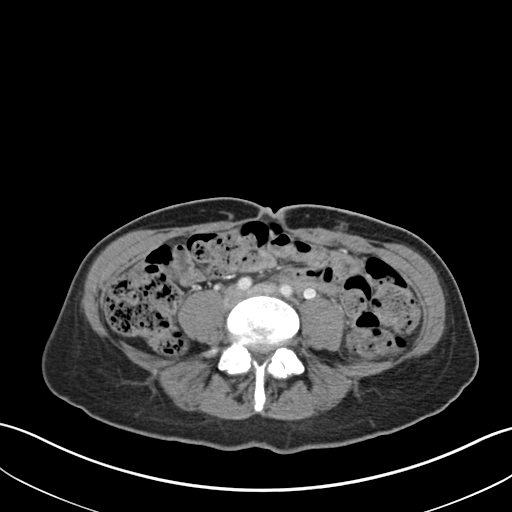
[im 52/98  soft-tissue]
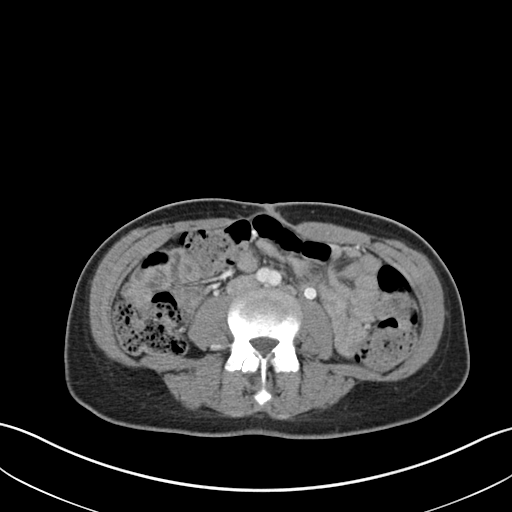
[im 59/98  soft-tissue]
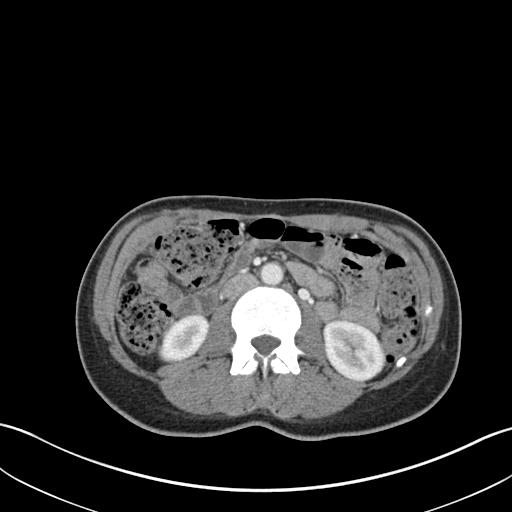
[im 65/98  soft-tissue]
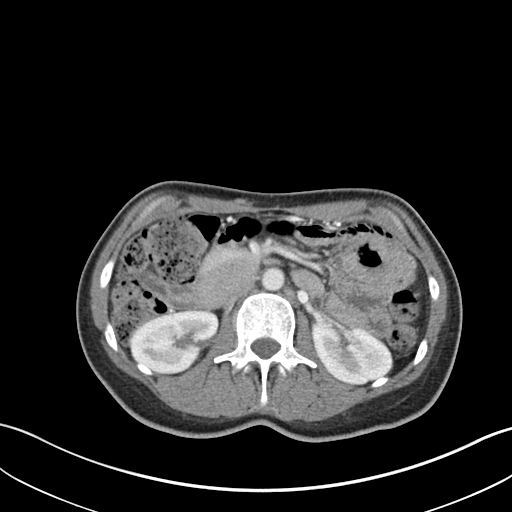
[im 65/98  bone]
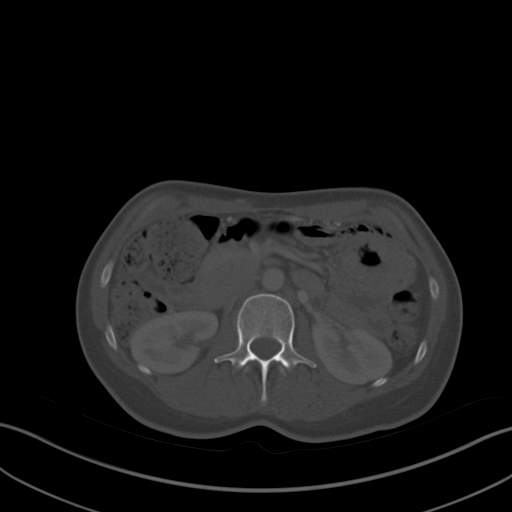
[im 78/98  soft-tissue]
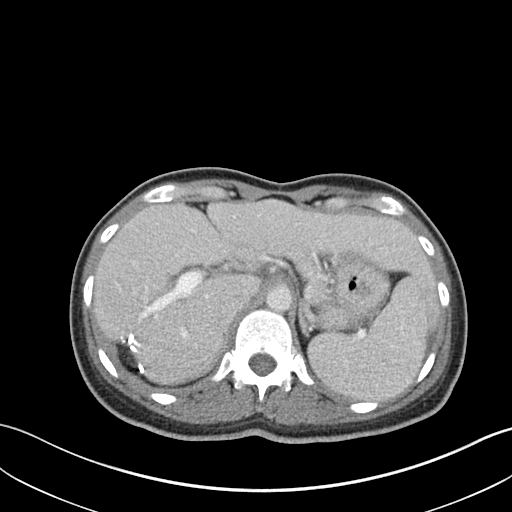
[im 85/98  soft-tissue]
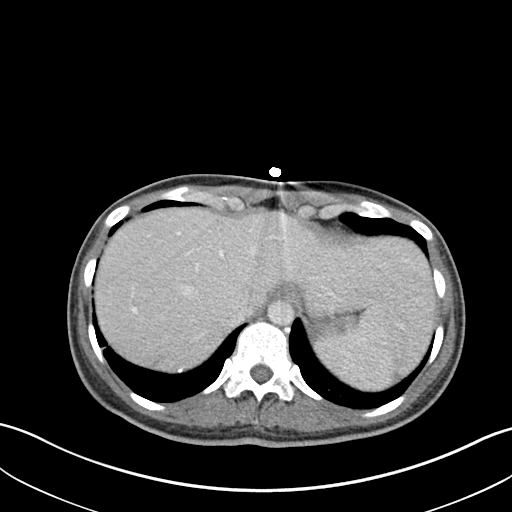
[im 91/98  soft-tissue]
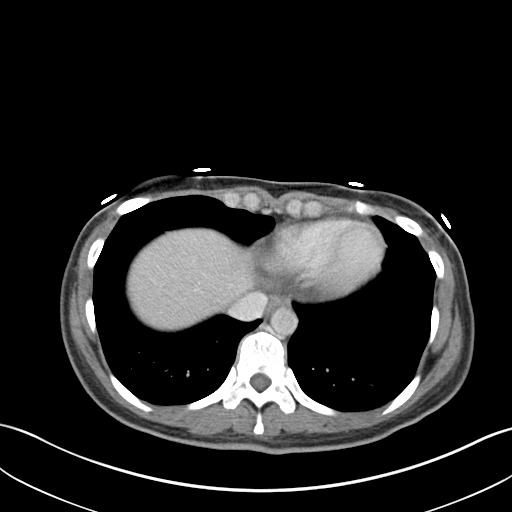

[Series 5: coronal st · coronal · 0.69mm/px · 3 of 108 slices shown]
[im 36/108  soft-tissue]
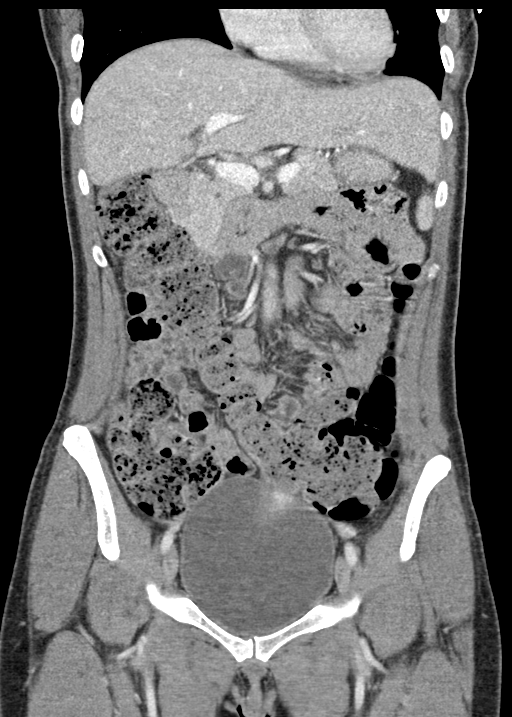
[im 48/108  soft-tissue]
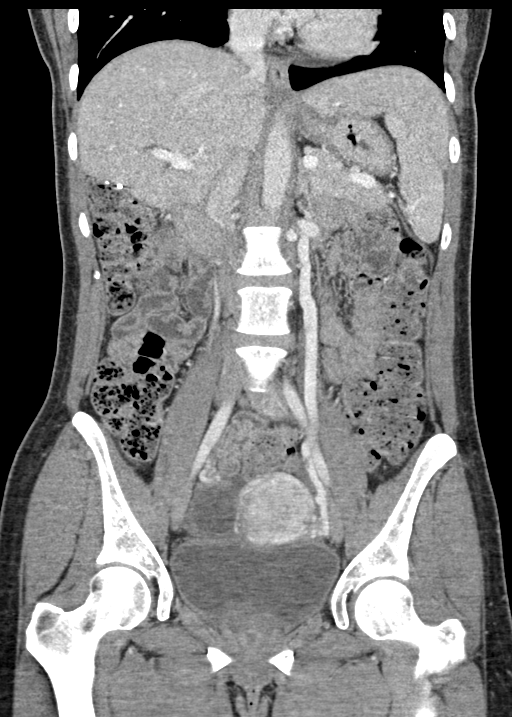
[im 60/108  soft-tissue]
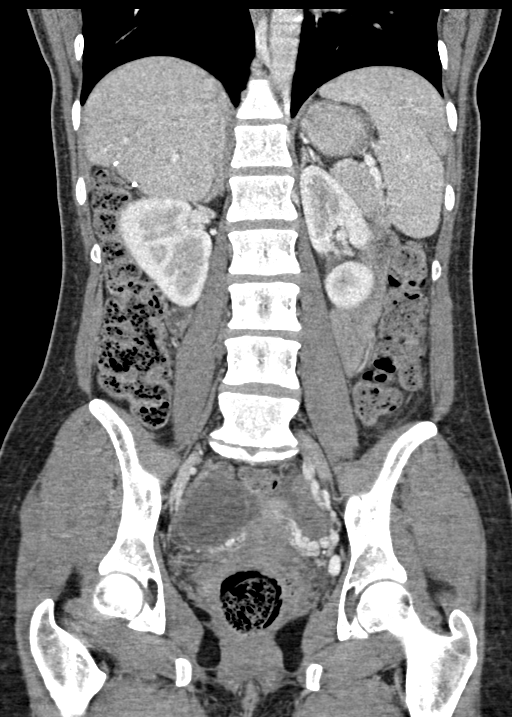

[15 of 46 positions shown; findings below may reference images not displayed]

FINDINGS: Lower chest: No acute abnormality.

Hepatobiliary: Postsurgical changes along the posterior margin of
the liver, unchanged. No liver mass or focal lesion. Status post
cholecystectomy. No bile duct dilation.

Pancreas: Unremarkable. No pancreatic ductal dilatation or
surrounding inflammatory changes.

Spleen: Normal in size without focal abnormality.

Adrenals/Urinary Tract: No adrenal masses. Kidneys normal in size,
orientation and position. No renal mass, stone or hydronephrosis.
Normal ureters. Normal bladder.

Stomach/Bowel: Moderate increase in the colonic and rectal stool
burden. No colonic wall thickening or inflammation. Stable
anastomosis staple line at the rectosigmoid junction. No evidence of
bowel obstruction. Stomach and small bowel are unremarkable. No
evidence of appendicitis.

Vascular/Lymphatic: No significant vascular findings are present. No
enlarged abdominal or pelvic lymph nodes.

Reproductive: 2.5 cm subserosal anterior uterine fibroid, stable.
Right ovarian/adnexal cystic mass measuring 5.2 x 4.7 x 5.2 cm, not
present on the prior CT.

Other: No ascites.

Musculoskeletal: No fracture or acute finding. No bone lesion. Disc
degenerative changes at L5-S1, stable from the prior CT.
IMPRESSION: 1. Right ovarian/adnexal cyst, 5.2 cm. Because this lesion is not
adequately characterized, prompt US is recommended for further
evaluation. Note: This recommendation does not apply to premenarchal
patients and to those with increased risk (genetic, family history,
elevated tumor markers or other high-risk factors) of ovarian
cancer. Reference: JACR [DATE]):[id number]
2. No other evidence of an acute abnormality within the abdomen or
pelvis.
3. Moderate increase in the colonic and rectal stool burden. No
bowel obstruction or inflammation.
4. No evidence of locally recurrent or metastatic colon carcinoma.

## 2021-10-05 IMAGING — US US ART/VEN ABD/PELV/SCROTUM DOPPLER LTD
2 series · 13 of 25 positions shown · non-contrast
Comparison: [DATE].

CLINICAL DATA: Doppler imaging to follow-up the pelvic ultrasound
dated [DATE].

EXAM:
DOPPLER ULTRASOUND OF OVARIES
TECHNIQUE: Limited transabdominal ultrasound examination of the pelvis was
performed to evaluate the ovaries and adnexa regions only.
Color and duplex Doppler ultrasound was utilized to evaluate blood
flow to the ovaries.

[Series 1: us art/ven abd/pelv/scrotum doppler ltd · 10 of 36 slices shown (1 of 2)]
[im 1/36]
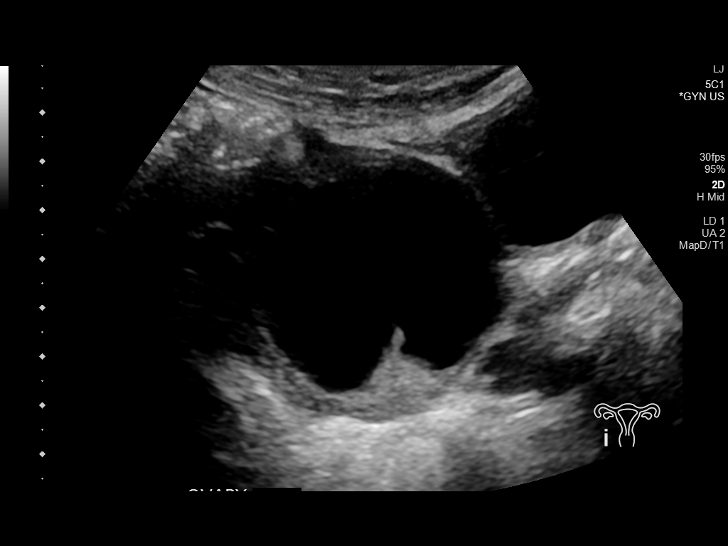
[im 4/36]
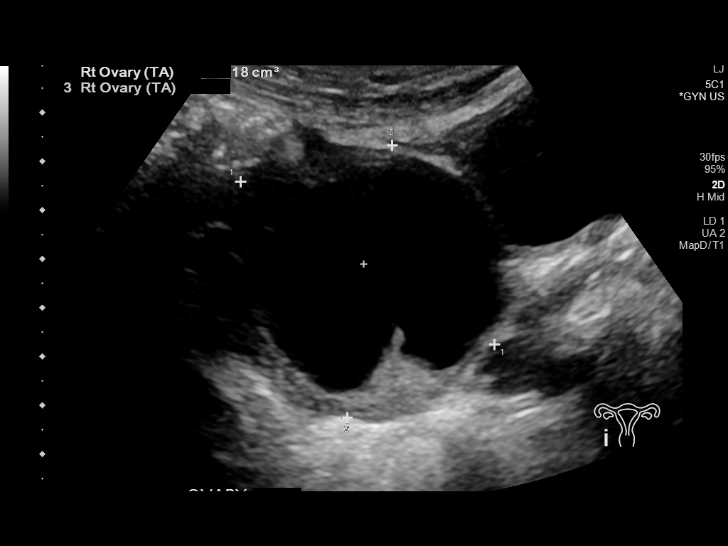
[im 8/36]
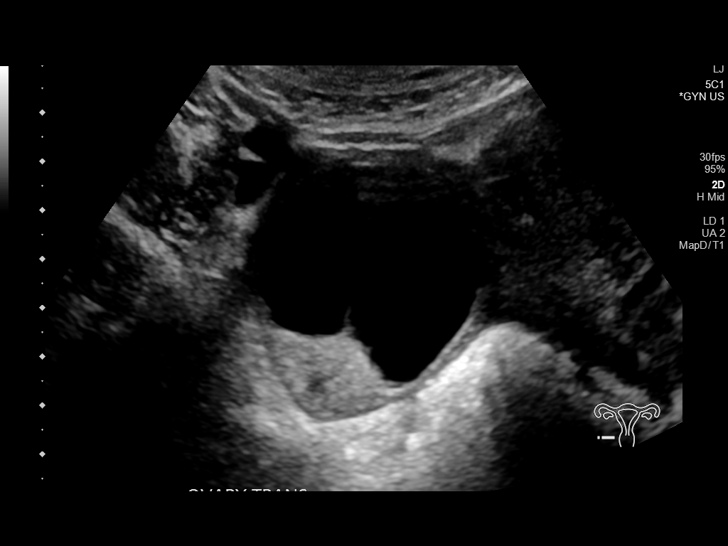
[im 12/36]
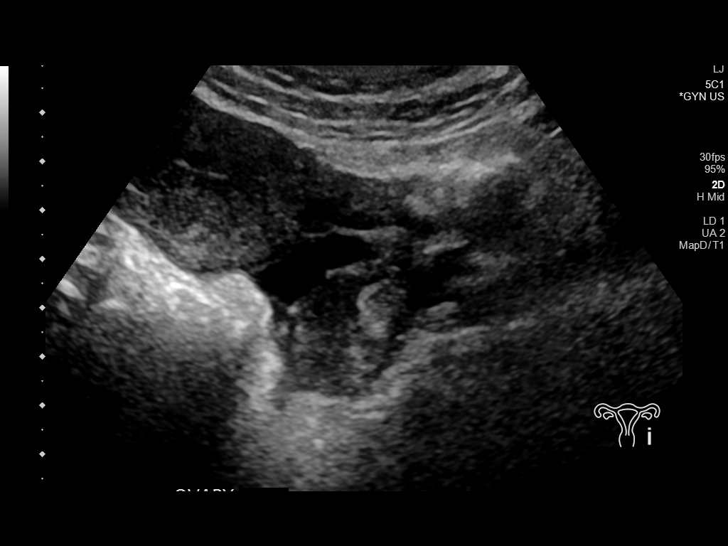
[im 16/36]
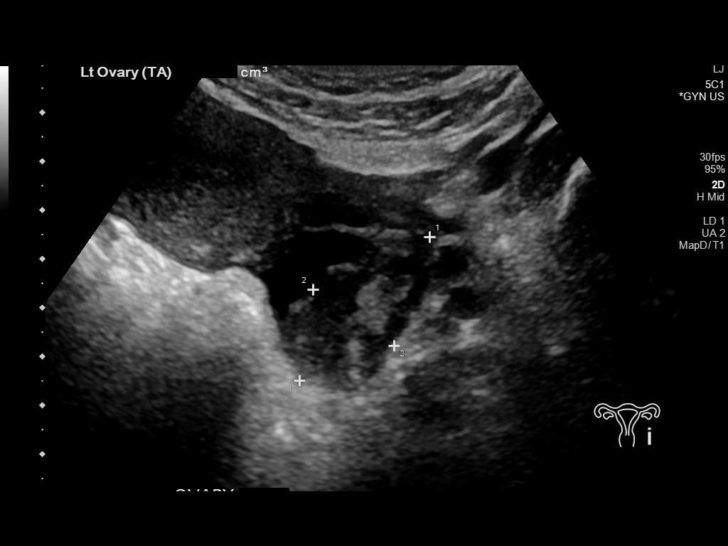
[im 20/36]
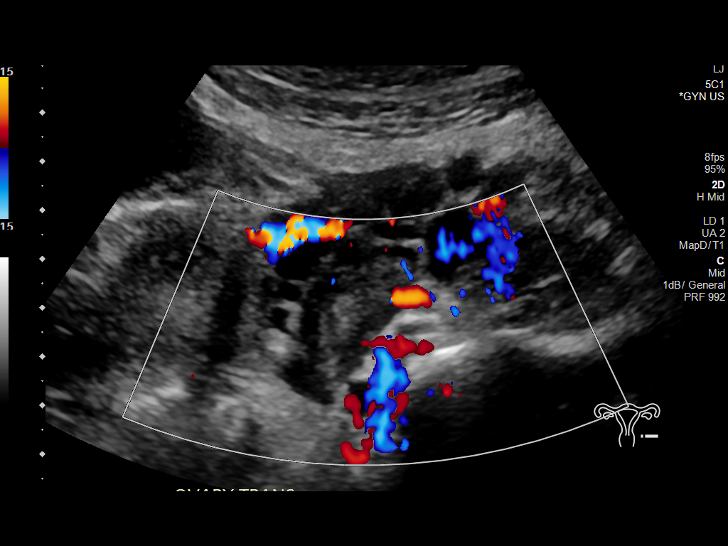
[im 24/36]
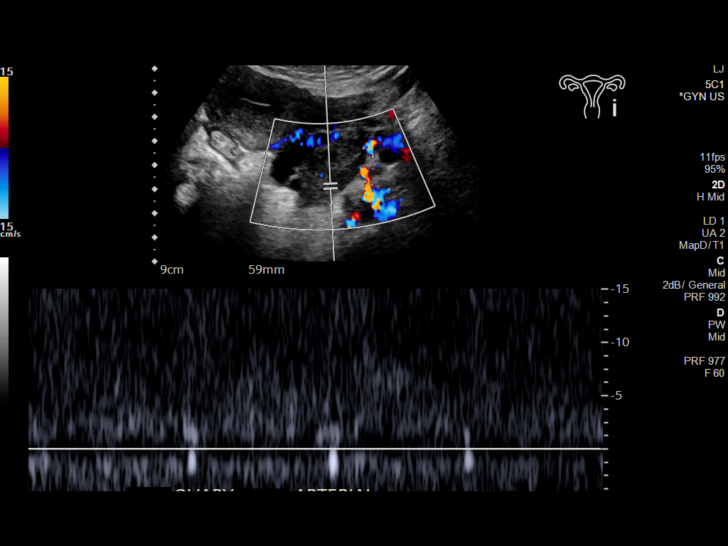
[im 28/36]
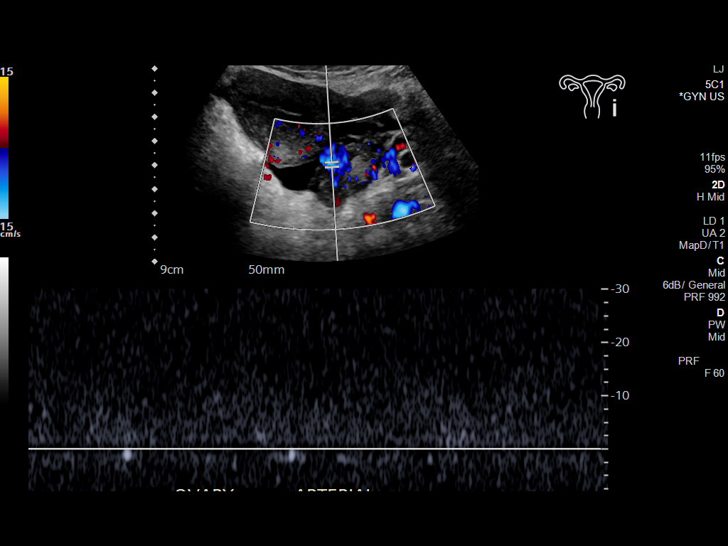
[im 32/36]
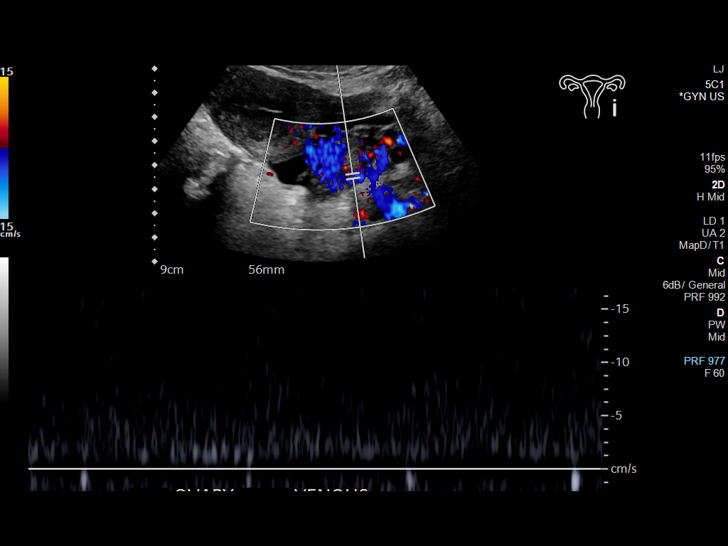
[im 36/36]
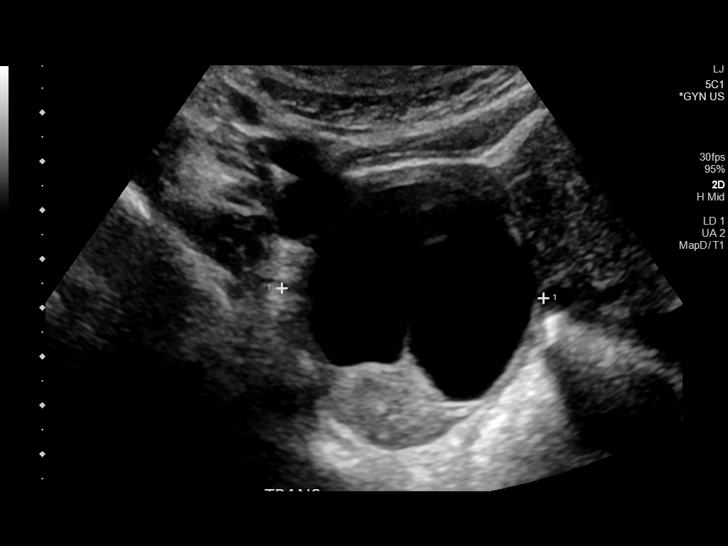

[Series 3: us art/ven abd/pelv/scrotum doppler ltd · 3 of 11 slices shown (2 of 2)]
[im 3/11]
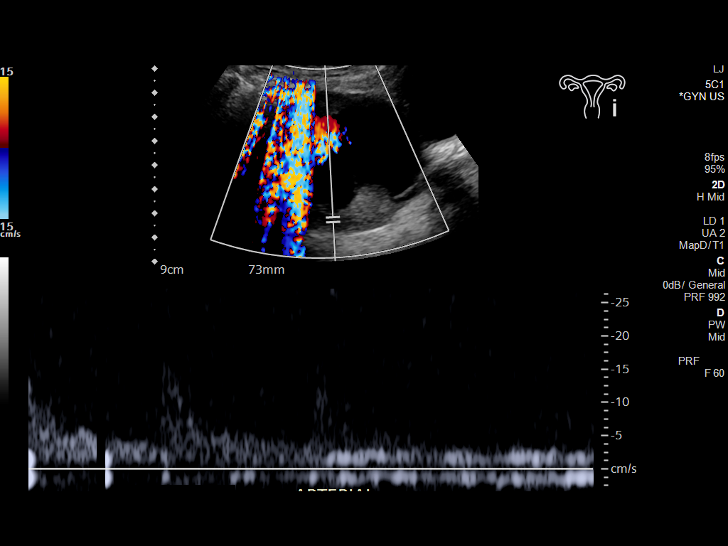
[im 7/11]
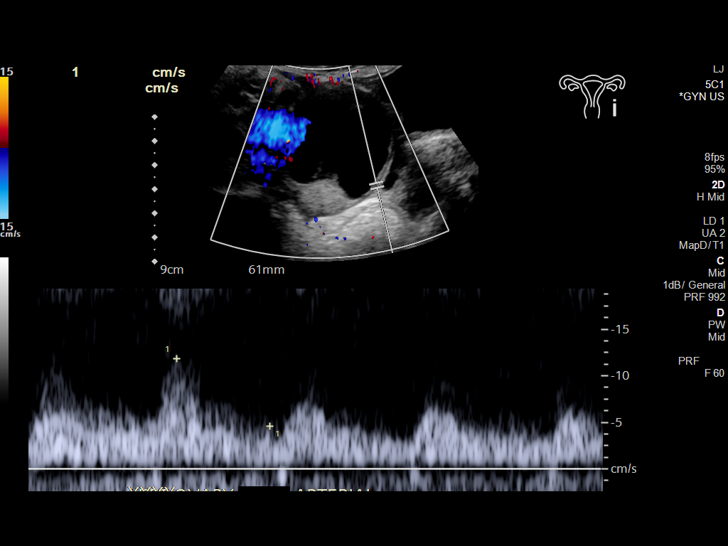
[im 11/11]
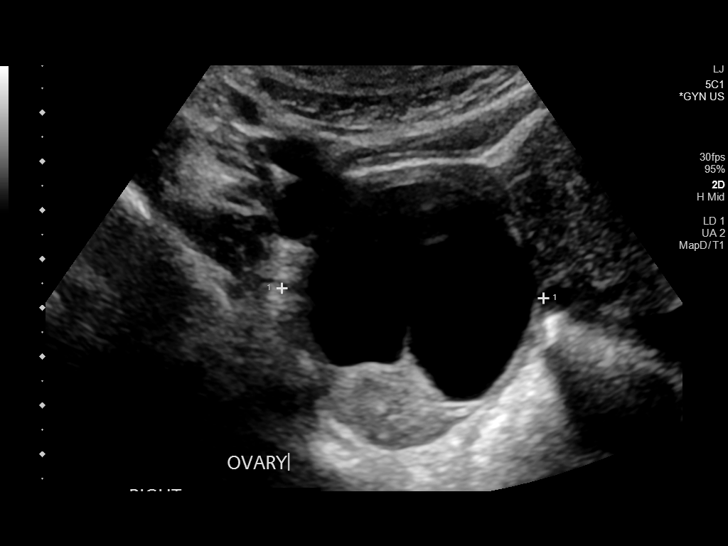

[13 of 25 positions shown; findings below may reference images not displayed]

FINDINGS: Right ovary

Measurements: 6.2 x 5.7 x 5.4 cm = volume: 98.2 mL. No change in the
cyst enlarging the right ovary compared to the previous day's exam.
Color Doppler and pulse Doppler analysis demonstrates normal
arteriovenous flow to ovarian tissue surrounding the cyst.

Left ovary

Measurements: 4.0 x 2.0 x 2.2 cm = volume: 9.3 mL. No change in the
ovary appearance since the previous day's exam. Normal color Doppler
blood flow. Normal low resistance arterial and venous blood flow.

Pulsed Doppler evaluation demonstrates normal low-resistance
arterial and venous waveforms in both ovaries.
IMPRESSION: 1. Doppler imaging of the ovaries to follow-up on the pelvic
ultrasound performed yesterday. Both ovaries demonstrate normal
arteriovenous blood flow. No evidence of torsion prior
recommendation is follow-up ultrasound in 6-8 weeks to reassess for
resolution/change.

## 2021-10-05 MED ORDER — IOHEXOL 350 MG/ML SOLN
80.0000 mL | Freq: Once | INTRAVENOUS | Status: AC | PRN
  Administered 2021-10-05: 80 mL via INTRAVENOUS

## 2021-10-05 MED ORDER — KETOROLAC TROMETHAMINE 30 MG/ML IJ SOLN
30.0000 mg | Freq: Once | INTRAMUSCULAR | Status: AC
  Administered 2021-10-05: 30 mg via INTRAVENOUS
  Filled 2021-10-05: qty 1

## 2021-10-05 NOTE — ED Provider Notes (Signed)
[facility] DEPT Provider Note   CSN: [id number] Arrival date & time: 10/04/21  1644     History  Chief Complaint  Patient presents with   Back Pain   Vaginal Bleeding    Stephanie [name] is a 41 y.o. female.  Stephanie [name] is a 41 y.o. female with history of metastatic colon cancer s/p resection and chemotherapy, uterine fibroids, ovarian cyst, presents to the emergency department for evaluation of left low back pain for the past month.  Patient reports that initially pain started as a dull intermittent ache in her left low back that sometimes radiated into her abdomen or down her leg.  She reports over the past few days though pain has become sharper and more severe.  She was seen at an oncology clinic for the symptoms on 12/30, this was felt to be most likely musculoskeletal back pain but she is concerned that it is worsened.  She denies associated numbness, tingling, weakness, loss of bowel or bladder control.  She does report that she has been having some frequent spotting, started a new birth control medication in November and since then has not had a regulated cycle.  Prior history of ovarian cyst and uterine fibroid.   Back Pain Associated symptoms: abdominal pain   Associated symptoms: no chest pain, no dysuria and no fever   Vaginal Bleeding Associated symptoms: abdominal pain and back pain   Associated symptoms: no dysuria, no fever and no nausea       Home Medications Prior to Admission medications   Medication Sig Start Date End Date Taking? Authorizing Provider  acetaminophen (TYLENOL) 325 MG tablet Take 2 tablets (650 mg total) by mouth every 6 (six) hours as needed for mild pain, moderate pain, fever or headache (fever > 101). 05/31/21   [name], MD  cyclobenzaprine (FLEXERIL) 10 MG tablet Take 1 tablet (10 mg total) by mouth 3 (three) times daily as needed for muscle spasms. 09/27/21   Walisiewicz, Verline Lema E, PA-C   ibuprofen (ADVIL) 200 MG tablet Take 400 mg by mouth every 6 (six) hours as needed for headache, fever or mild pain.    [provider]  Lactic Ac-Citric Ac-Pot Bitart (PHEXXI) 1.8-1-0.4 % GEL Place 5 g vaginally as needed. Patient not taking: Reported on 08/22/2021 08/05/21   Elvera Maria, CNM  meloxicam (MOBIC) 7.5 MG tablet Take 1 tablet (7.5 mg total) by mouth daily. Patient not taking: Reported on 08/22/2021 04/28/21   Montine Circle, PA-C  Norethindrone Acetate-Ethinyl Estradiol (LOESTRIN 1.5/30, 21,) 1.5-30 MG-MCG tablet Take 1 tablet by mouth daily. 09/10/21   Griffin Basil, MD  Prenatal Vit-Fe Fumarate-FA (PRENATAL MULTIVITAMIN) TABS tablet Take 1 tablet by mouth daily at 12 noon.    [provider]      Allergies    Oxaliplatin    Review of Systems   Review of Systems  Constitutional:  Negative for chills and fever.  HENT: Negative.    Respiratory:  Negative for shortness of breath.   Cardiovascular:  Negative for chest pain.  Gastrointestinal:  Positive for abdominal pain. Negative for blood in stool, constipation, diarrhea, nausea and vomiting.  Genitourinary:  Positive for vaginal bleeding. Negative for dysuria, frequency and hematuria.  Musculoskeletal:  Positive for back pain.  Skin:  Negative for color change and rash.   Physical Exam Updated Vital Signs BP 121/84 (BP Location: Left Arm)    Pulse 75    Temp 98.2 F (36.8 C) (Oral)  Resp 18    Ht 5\' 8"  (1.727 m)    Wt 67.1 kg    LMP 10/04/2021    SpO2 100%    BMI 22.50 kg/m  Physical Exam Vitals and nursing note reviewed. Exam conducted with a chaperone present.  Constitutional:      General: She is not in acute distress.    Appearance: Normal appearance. She is well-developed. She is not ill-appearing or diaphoretic.  HENT:     Head: Normocephalic and atraumatic.     Mouth/Throat:     Mouth: Mucous membranes are moist.     Pharynx: Oropharynx is clear.  Eyes:     General:         Right eye: No discharge.        Left eye: No discharge.  Cardiovascular:     Rate and Rhythm: Normal rate and regular rhythm.     Pulses: Normal pulses.     Heart sounds: Normal heart sounds.  Pulmonary:     Effort: Pulmonary effort is normal. No respiratory distress.     Breath sounds: Normal breath sounds. No wheezing or rales.     Comments: Respirations equal and unlabored, patient able to speak in full sentences, lungs clear to auscultation bilaterally  Abdominal:     General: Bowel sounds are normal. There is no distension.     Palpations: Abdomen is soft. There is no mass.     Tenderness: There is abdominal tenderness. There is no guarding.     Comments: Abdomen soft, nondistended, bowel sounds present, there is some mild left lower quadrant abdominal pain, no guarding or rebound tenderness, all other quadrants nontender to palpation.  No CVA tenderness bilaterally.  Genitourinary:    Comments: Chaperone present during pelvic exam. No external genital lesions noted. There is a small amount of dark blood present in the vaginal vault but no active bleeding, cervical os is closed, no discharge present. Palpable right adnexal mass nontender, there is some mild left adnexal tenderness.  No cervical motion tenderness. Musculoskeletal:        General: No deformity.     Cervical back: Neck supple.  Skin:    General: Skin is warm and dry.     Capillary Refill: Capillary refill takes less than 2 seconds.  Neurological:     Mental Status: She is alert and oriented to person, place, and time.     Coordination: Coordination normal.     Comments: Speech is clear, able to follow commands Moves extremities without ataxia, coordination intact  Psychiatric:        Mood and Affect: Mood normal.        Behavior: Behavior normal.    ED Results / Procedures / Treatments   Labs (all labs ordered are listed, but only abnormal results are displayed) Labs Reviewed  URINALYSIS, ROUTINE W REFLEX  MICROSCOPIC - Abnormal; Notable for the following components:      Result Value   Color, Urine STRAW (*)    APPearance HAZY (*)    Hgb urine dipstick MODERATE (*)    Leukocytes,Ua TRACE (*)    Bacteria, UA MANY (*)    All other components within normal limits  COMPREHENSIVE METABOLIC PANEL - Abnormal; Notable for the following components:   Sodium 134 (*)    Calcium 8.5 (*)    Total Protein 6.4 (*)    AST 14 (*)    Anion gap 4 (*)    All other components within normal limits  CBC WITH  DIFFERENTIAL/PLATELET - Abnormal; Notable for the following components:   Platelets 103 (*)    All other components within normal limits  WET PREP, GENITAL  URINE CULTURE  PREGNANCY, URINE  GC/CHLAMYDIA PROBE AMP ([place]) NOT AT [facility]    EKG None  Radiology US Transvaginal Non-OB  Result Date: 10/04/2021 CLINICAL DATA:  Pelvic pain, bleeding, history of colon cancer EXAM: TRANSABDOMINAL AND TRANSVAGINAL ULTRASOUND OF PELVIS TECHNIQUE: Both transabdominal and transvaginal ultrasound examinations of the pelvis were performed. Transabdominal technique was performed for global imaging of the pelvis including uterus, ovaries, adnexal regions, and pelvic cul-de-sac. It was necessary to proceed with endovaginal exam following the transabdominal exam to visualize the endometrium and adnexal structures. COMPARISON:  08/22/2021, 04/28/2021 FINDINGS: Uterus Measurements: 7.9 x 4.8 by 5.9 cm = volume: 116.7 mL. Uterus is heterogeneous, with prominent intramural fibroid seen in the ventral aspect of the room body measuring 2.6 x 2.1 x 2.4 cm. Endometrium Thickness: 3 mm.  No focal abnormality visualized. Right ovary Measurements: 6.6 x 5.1 by 5.9 cm = volume: 105.5 mL. Since the prior CT, a complex right ovarian cyst has developed measuring 5.0 x 5.7 x 4.2 cm. Thickened septation is seen, with a mural nodule measuring 2.3 x 1.5 x 2.2 cm. Left ovary Measurements: 3.5 x 1.5 by 2.5 cm = volume: 6.8 mL. Nonspecific  hyperechoic region within the left ovary measuring 1.5 x 1.0 x 0.8 cm could reflect a hemorrhagic cyst. Other findings Trace pelvic free fluid within the cul-de-sac. IMPRESSION: 1. Uterine fibroid. 2. Normal appearance of the endometrium. If bleeding remains unresponsive to hormonal or medical therapy, sonohysterogram should be considered for focal lesion work-up. (Ref: Radiological Reasoning: Algorithmic Workup of Abnormal Vaginal Bleeding with Endovaginal Sonography and Sonohysterography. AJR 2008; 585:I77-82) 3. Complex right ovarian cyst as above, with thickened septation and mural nodule. No abnormality was identified in the right adnexa on recent CT dated 08/22/2021. Follow-up ultrasound in 6-8 weeks is recommended to assess for interval resolution. If the abnormality persists at that time, or if more urgent evaluation is desired, an outpatient pelvic MRI with without contrast could be performed. 4. Likely small hemorrhagic cyst left ovary. This could also be reassessed at the time of follow-up. Electronically Signed   By: Randa Ngo M.D.   On: 10/04/2021 19:55   US Pelvis Complete  Result Date: 10/04/2021 CLINICAL DATA:  Pelvic pain, bleeding, history of colon cancer EXAM: TRANSABDOMINAL AND TRANSVAGINAL ULTRASOUND OF PELVIS TECHNIQUE: Both transabdominal and transvaginal ultrasound examinations of the pelvis were performed. Transabdominal technique was performed for global imaging of the pelvis including uterus, ovaries, adnexal regions, and pelvic cul-de-sac. It was necessary to proceed with endovaginal exam following the transabdominal exam to visualize the endometrium and adnexal structures. COMPARISON:  08/22/2021, 04/28/2021 FINDINGS: Uterus Measurements: 7.9 x 4.8 by 5.9 cm = volume: 116.7 mL. Uterus is heterogeneous, with prominent intramural fibroid seen in the ventral aspect of the room body measuring 2.6 x 2.1 x 2.4 cm. Endometrium Thickness: 3 mm.  No focal abnormality visualized. Right  ovary Measurements: 6.6 x 5.1 by 5.9 cm = volume: 105.5 mL. Since the prior CT, a complex right ovarian cyst has developed measuring 5.0 x 5.7 x 4.2 cm. Thickened septation is seen, with a mural nodule measuring 2.3 x 1.5 x 2.2 cm. Left ovary Measurements: 3.5 x 1.5 by 2.5 cm = volume: 6.8 mL. Nonspecific hyperechoic region within the left ovary measuring 1.5 x 1.0 x 0.8 cm could reflect a hemorrhagic cyst. Other findings  Trace pelvic free fluid within the cul-de-sac. IMPRESSION: 1. Uterine fibroid. 2. Normal appearance of the endometrium. If bleeding remains unresponsive to hormonal or medical therapy, sonohysterogram should be considered for focal lesion work-up. (Ref: Radiological Reasoning: Algorithmic Workup of Abnormal Vaginal Bleeding with Endovaginal Sonography and Sonohysterography. AJR 2008; 751:W25-85) 3. Complex right ovarian cyst as above, with thickened septation and mural nodule. No abnormality was identified in the right adnexa on recent CT dated 08/22/2021. Follow-up ultrasound in 6-8 weeks is recommended to assess for interval resolution. If the abnormality persists at that time, or if more urgent evaluation is desired, an outpatient pelvic MRI with without contrast could be performed. 4. Likely small hemorrhagic cyst left ovary. This could also be reassessed at the time of follow-up. Electronically Signed   By: Randa Ngo M.D.   On: 10/04/2021 19:55   CT Abdomen Pelvis W Contrast  Result Date: 10/05/2021 CLINICAL DATA:  Left lower quadrant abdominal pain and left low back pain. History of colon carcinoma. EXAM: CT ABDOMEN AND PELVIS WITH CONTRAST TECHNIQUE: Multidetector CT imaging of the abdomen and pelvis was performed using the standard protocol following bolus administration of intravenous contrast. CONTRAST:  60mL OMNIPAQUE IOHEXOL 350 MG/ML SOLN COMPARISON:  Multiple prior CTs, most recent dated 08/22/2021. FINDINGS: Lower chest: No acute abnormality. Hepatobiliary: Postsurgical  changes along the posterior margin of the liver, unchanged. No liver mass or focal lesion. Status post cholecystectomy. No bile duct dilation. Pancreas: Unremarkable. No pancreatic ductal dilatation or surrounding inflammatory changes. Spleen: Normal in size without focal abnormality. Adrenals/Urinary Tract: No adrenal masses. Kidneys normal in size, orientation and position. No renal mass, stone or hydronephrosis. Normal ureters. Normal bladder. Stomach/Bowel: Moderate increase in the colonic and rectal stool burden. No colonic wall thickening or inflammation. Stable anastomosis staple line at the rectosigmoid junction. No evidence of bowel obstruction. Stomach and small bowel are unremarkable. No evidence of appendicitis. Vascular/Lymphatic: No significant vascular findings are present. No enlarged abdominal or pelvic lymph nodes. Reproductive: 2.5 cm subserosal anterior uterine fibroid, stable. Right ovarian/adnexal cystic mass measuring 5.2 x 4.7 x 5.2 cm, not present on the prior CT. Other: No ascites. Musculoskeletal: No fracture or acute finding. No bone lesion. Disc degenerative changes at L5-S1, stable from the prior CT. IMPRESSION: 1. Right ovarian/adnexal cyst, 5.2 cm. Because this lesion is not adequately characterized, prompt [place] is recommended for further evaluation. Note: This recommendation does not apply to premenarchal patients and to those with increased risk (genetic, family history, elevated tumor markers or other high-risk factors) of ovarian cancer. Reference: JACR 2020 Feb; 17(2):248-254 2. No other evidence of an acute abnormality within the abdomen or pelvis. 3. Moderate increase in the colonic and rectal stool burden. No bowel obstruction or inflammation. 4. No evidence of locally recurrent or metastatic colon carcinoma. Electronically Signed   By: Lajean Manes M.D.   On: 10/05/2021 10:54   US PELVIC DOPPLER (TORSION R/O OR MASS ARTERIAL FLOW)  Result Date: 10/05/2021 CLINICAL DATA:   Doppler imaging to follow-up the pelvic ultrasound dated 10/04/2021. EXAM: DOPPLER ULTRASOUND OF OVARIES TECHNIQUE: Limited transabdominal ultrasound examination of the pelvis was performed to evaluate the ovaries and adnexa regions only. Color and duplex Doppler ultrasound was utilized to evaluate blood flow to the ovaries. COMPARISON:  10/04/2021. FINDINGS: Right ovary Measurements: 6.2 x 5.7 x 5.4 cm = volume: 98.2 mL. No change in the cyst enlarging the right ovary compared to the previous day's exam. Color Doppler and pulse Doppler analysis demonstrates normal arteriovenous flow  to ovarian tissue surrounding the cyst. Left ovary Measurements: 4.0 x 2.0 x 2.2 cm = volume: 9.3 mL. No change in the ovary appearance since the previous day's exam. Normal color Doppler blood flow. Normal low resistance arterial and venous blood flow. Pulsed Doppler evaluation demonstrates normal low-resistance arterial and venous waveforms in both ovaries. IMPRESSION: 1. Doppler imaging of the ovaries to follow-up on the pelvic ultrasound performed yesterday. Both ovaries demonstrate normal arteriovenous blood flow. No evidence of torsion prior recommendation is follow-up ultrasound in 6-8 weeks to reassess for resolution/change. Electronically Signed   By: Lajean Manes M.D.   On: 10/05/2021 10:35    Procedures Procedures    Medications Ordered in ED Medications  ketorolac (TORADOL) 30 MG/ML injection 30 mg (30 mg Intravenous Given 10/05/21 0925)  iohexol (OMNIPAQUE) 350 MG/ML injection 80 mL (80 mLs Intravenous Contrast Given 10/05/21 1025)    ED Course/ Medical Decision Making/ A&P                           Medical Decision Making  41 y.o. female presents to the ED with complaints of left low back and abdominal pain, this involves an extensive number of treatment options, and is a complaint that carries with it a high risk of complications and morbidity.  The differential diagnosis includes musculoskeletal back pain,  ovarian cyst, ovarian torsion, uterine fibroid, diverticulitis, nephrolithiasis, UTI  On arrival pt is nontoxic, vitals WNL. Exam significant for lower abdominal and left low back tenderness without positive straight leg raise or focal neurologic symptoms.  Some dark blood on exam but no active bleeding on pelvic exam, mild left adnexal tenderness  Additional history obtained from medical records. Previous records obtained and reviewed via EMR  I ordered Toradol for pain, and patient reports some improvement in pain after this medication  Lab Tests:  I Ordered, reviewed, and interpreted labs.  Labs significant for no leukocytosis and normal hemoglobin despite vaginal bleeding, no significant electrolyte derangements, normal renal and liver function.  Urinalysis with trace leukocytes, many bacteria present with some squamous cells, patient not currently having urinary symptoms, will send for culture.  Wet prep negative.  GC/chlamydia is pending.  Imaging Studies ordered:  I ordered imaging studies which included pelvic ultrasound with Doppler and CT abdomen pelvis, I independently visualized and interpreted imaging which showed : Pelvic ultrasound: Complex right ovarian cyst and small simple appearing hemorrhagic cyst on the left ovary.  Doppler with no evidence of torsion bilaterally.  Recommend follow-up ultrasound in 6-8 weeks CT abdomen pelvis: Moderate constipation, ovarian cyst as noted on ultrasound, no evidence of locally recurrent or metastatic colon carcinoma.  No other acute abnormalities.  ED Course:   Work-up in the ED has been reassuring.  Suspect back pain that has been occurring over the past month may be related to degenerative disc disease, worsening pain over the past few days could also be due to small left hemorrhagic cyst.  Patient has planned follow-up with OB/GYN next week.  I have also given a referral to orthopedics regarding back pain and provided patient with back  exercises.  Encourage patient to use NSAIDs on a more regular basis to help treat back pain.  The rest of her work-up is reassuring and we fortunately see no evidence of recurrence of her cancer which has been a source of anxiety for the patient.  Recommend continued outpatient follow-up and return precautions discussed.  Discharged home in good condition.  Portions of this note were generated with [employer]. Dictation errors may occur despite best attempts at proofreading.         Final Clinical Impression(s) / ED Diagnoses Final diagnoses:  Bilateral ovarian cysts  Acute left-sided low back pain without sciatica    Rx / DC Orders ED Discharge Orders     None         Janet Berlin 10/05/21 1327    Margette Fast, MD 10/06/21 1758

## 2021-10-05 NOTE — Discharge Instructions (Addendum)
Your evaluation today shows bilateral ovarian cysts, the cyst on your left may be causing your worsening pain.  I also suspect some of this left low back pain is musculoskeletal.  Your CT shows some degenerative disc disease at L5-S1.  You should follow-up with your OB/GYN regarding frequent spotting on your birth control as well as these bilateral ovarian cyst.  CT scan also showed moderate constipation but was otherwise reassuring.  To help treat pain use back exercises provided.  You can take ibuprofen 3 times daily or naproxen twice daily as well as the muscle relaxers and tramadol you were prescribed previously.  Return for new or worsening symptoms.

## 2021-10-05 NOTE — ED Notes (Signed)
Pt to CT at this time.

## 2021-10-06 LAB — URINE CULTURE: Culture: 60000 — AB

## 2021-10-07 LAB — GC/CHLAMYDIA PROBE AMP (~~LOC~~) NOT AT ARMC
Chlamydia: NEGATIVE
Comment: NEGATIVE
Comment: NORMAL
Neisseria Gonorrhea: NEGATIVE

## 2021-10-07 NOTE — Progress Notes (Signed)
ED Antimicrobial Stewardship Positive Culture Follow Up   Stephanie [name] is an 41 y.o. female who presented to [facility] on 10/04/2021 with a chief complaint of  Chief Complaint  Patient presents with   Back Pain   Vaginal Bleeding    Recent Results (from the past 720 hour(s))  Urine Culture     Status: Abnormal   Collection Time: 10/04/21  5:41 PM   Specimen: Urine, Clean Catch  Result Value Ref Range Status   Specimen Description   Final    URINE, CLEAN CATCH Performed at [facility] [address] [place], [place] [address]    Special Requests   Final    NONE Performed at [facility], [place] [address] [place], [place] [address]    Culture (A)  Final    60,000 COLONIES/mL LACTOBACILLUS SPECIES Standardized susceptibility testing for this organism is not available. Performed at [facility], [place] [address], [place], [place] [address]    Report Status 10/06/2021 FINAL  Final  Wet prep, genital     Status: None   Collection Time: 10/05/21 11:01 AM  Result Value Ref Range Status   Yeast Wet Prep HPF POC NONE SEEN NONE SEEN Final   Trich, Wet Prep NONE SEEN NONE SEEN Final   Clue Cells Wet Prep HPF POC NONE SEEN NONE SEEN Final   WBC, Wet Prep HPF POC <10 <10 Final   Sperm NONE SEEN  Final    Comment: Performed at [facility], [place] [address] [place],  [address]    []  Treated with , organism resistant to prescribed antimicrobial []  Patient discharged originally without antimicrobial agent and treatment is now indicated  - Likely contaminant, no treatment indicated   ED Provider: Dr. Harlen Labs 10/07/2021, 11:17 AM Clinical Pharmacist [phone]

## 2021-10-08 ENCOUNTER — Other Ambulatory Visit: Payer: Self-pay

## 2021-10-08 ENCOUNTER — Ambulatory Visit (INDEPENDENT_AMBULATORY_CARE_PROVIDER_SITE_OTHER): Payer: Medicaid Other | Admitting: Obstetrics and Gynecology

## 2021-10-08 ENCOUNTER — Encounter: Payer: Self-pay | Admitting: Obstetrics and Gynecology

## 2021-10-08 DIAGNOSIS — N83209 Unspecified ovarian cyst, unspecified side: Secondary | ICD-10-CM | POA: Insufficient documentation

## 2021-10-08 DIAGNOSIS — N83201 Unspecified ovarian cyst, right side: Secondary | ICD-10-CM

## 2021-10-08 DIAGNOSIS — N83202 Unspecified ovarian cyst, left side: Secondary | ICD-10-CM

## 2021-10-08 MED ORDER — HYDROCODONE-ACETAMINOPHEN 5-325 MG PO TABS: 1.0000 | ORAL_TABLET | Freq: Four times a day (QID) | ORAL | 0 refills | Status: DC | PRN

## 2021-10-08 MED ORDER — IBUPROFEN 600 MG PO TABS: 600.0000 mg | ORAL_TABLET | Freq: Four times a day (QID) | ORAL | 2 refills | Status: DC | PRN

## 2021-10-08 NOTE — Progress Notes (Signed)
Pt presents today with bilateral back pain. Pt had U/S and CT done on 10/04/21. Pt would like to discuss results and how to manage ovarian cysts. LMP: 09/18/21 BCM: OCP

## 2021-10-09 NOTE — Progress Notes (Signed)
CC: ovarian cyst Subjective:    Patient ID: Stephanie [name], female    DOB: November 30, 1980, 41 y.o.   MRN: [id number]  HPI 41 yo G3P1 , c/s x 1, seen for 1 month hx of back and abdominal pain.  She was seen in the ED on 1/6 and 1/7 for evaluation.  She was concerned due to recent hx of colon cancer.  The back pain was bilateral with R> L  She notes mild nausea but no emesis.  She denies dyspareunia or constipation.  Review of CT scan from 11/22 showed no adnexal masses and a small uterine fibroid.  On 10/04/21 a new right ovarian cyst  was seen.  There was also a small hemorrhagic cyst on the left.   Review of Systems  Constitutional:  Negative for activity change, appetite change, fever and unexpected weight change.  Respiratory: Negative.    Cardiovascular: Negative.   Gastrointestinal:  Positive for nausea. Negative for diarrhea and vomiting.  Genitourinary:  Positive for pelvic pain. Negative for menstrual problem.      Objective:   Physical Exam Constitutional:      Appearance: Normal appearance. She is normal weight.  HENT:     Head: Normocephalic and atraumatic.  Cardiovascular:     Rate and Rhythm: Normal rate and regular rhythm.  Pulmonary:     Effort: Pulmonary effort is normal.     Breath sounds: Normal breath sounds.  Abdominal:     General: There is no distension.     Palpations: Abdomen is soft. There is no mass.     Tenderness: There is no guarding.     Comments: Very mild lower pelvic tenderness  Neurological:     Mental Status: She is alert.   Vitals:   10/08/21 1335  BP: 115/77  Pulse: 82   CLINICAL DATA:  Right rectal bleeding last night. Assess for diverticulitis. History of colon cancer.   EXAM: CT ABDOMEN AND PELVIS WITH CONTRAST   TECHNIQUE: Multidetector CT imaging of the abdomen and pelvis was performed using the standard protocol following bolus administration of intravenous contrast.   CONTRAST:  57mL OMNIPAQUE IOHEXOL 350 MG/ML SOLN    COMPARISON:  July 11, 2021   FINDINGS: Lower chest: No acute abnormality.   Hepatobiliary: Stable postoperative changes are identified in the right lobe liver unchanged compared prior exam. No mass lesion is identified liver. The biliary tree is stable.   Pancreas: Unremarkable. No pancreatic ductal dilatation or surrounding inflammatory changes.   Spleen: Normal in size without focal abnormality.   Adrenals/Urinary Tract: Adrenal glands are unremarkable. Kidneys are normal, without renal calculi, focal lesion, or hydronephrosis. Bladder is unremarkable.   Stomach/Bowel: The small bowel is normal. There is no small bowel obstruction. The stomach is. Extensive bowel content is identified throughout the colon consistent with constipation. 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   Vascular/Lymphatic: No significant vascular findings are present. No enlarged abdominal or pelvic lymph nodes.   Reproductive: There is probably uterine fibroid. No abnormal masses are identified in bilateral adnexa.   Other: Small umbilical hernia containing a portion of the colon is unchanged. No evidence of obstruction.   Musculoskeletal: Mild degenerative joint changes of lower lumbar spine noted.   IMPRESSION: 1. Extensive bowel content is identified throughout the colon consistent with constipation. 2. 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 3. Stable postoperative changes in the right lobe liver.     CLINICAL  DATA:  Pelvic pain, bleeding, history of colon cancer   EXAM: TRANSABDOMINAL AND TRANSVAGINAL ULTRASOUND OF PELVIS   TECHNIQUE: Both transabdominal and transvaginal ultrasound examinations of the pelvis were performed. Transabdominal technique was performed for global imaging of the pelvis including uterus, ovaries, adnexal regions, and pelvic cul-de-sac. It was  necessary to proceed with endovaginal exam following the transabdominal exam to visualize the endometrium and adnexal structures.   COMPARISON:  08/22/2021, 04/28/2021   FINDINGS: Uterus   Measurements: 7.9 x 4.8 by 5.9 cm = volume: 116.7 mL. Uterus is heterogeneous, with prominent intramural fibroid seen in the ventral aspect of the room body measuring 2.6 x 2.1 x 2.4 cm.   Endometrium   Thickness: 3 mm.  No focal abnormality visualized.   Right ovary   Measurements: 6.6 x 5.1 by 5.9 cm = volume: 105.5 mL. Since the prior CT, a complex right ovarian cyst has developed measuring 5.0 x 5.7 x 4.2 cm. Thickened septation is seen, with a mural nodule measuring 2.3 x 1.5 x 2.2 cm.   Left ovary   Measurements: 3.5 x 1.5 by 2.5 cm = volume: 6.8 mL. Nonspecific hyperechoic region within the left ovary measuring 1.5 x 1.0 x 0.8 cm could reflect a hemorrhagic cyst.   Other findings   Trace pelvic free fluid within the cul-de-sac.   IMPRESSION: 1. Uterine fibroid. 2. Normal appearance of the endometrium. If bleeding remains unresponsive to hormonal or medical therapy, sonohysterogram should be considered for focal lesion work-up. (Ref: Radiological Reasoning: Algorithmic Workup of Abnormal Vaginal Bleeding with Endovaginal Sonography and Sonohysterography. AJR 2008; 269:S85-46) 3. Complex right ovarian cyst as above, with thickened septation and mural nodule. No abnormality was identified in the right adnexa on recent CT dated 08/22/2021. Follow-up ultrasound in 6-8 weeks is recommended to assess for interval resolution. If the abnormality persists at that time, or if more urgent evaluation is desired, an outpatient pelvic MRI with without contrast could be performed. 4. Likely small hemorrhagic cyst left ovary. This could also be reassessed at the time of follow-up.    CLINICAL DATA:  Left lower quadrant abdominal pain and left low back pain. History of colon carcinoma.    EXAM: CT ABDOMEN AND PELVIS WITH CONTRAST   TECHNIQUE: Multidetector CT imaging of the abdomen and pelvis was performed using the standard protocol following bolus administration of intravenous contrast.   CONTRAST:  2mL OMNIPAQUE IOHEXOL 350 MG/ML SOLN   COMPARISON:  Multiple prior CTs, most recent dated 08/22/2021.   FINDINGS: Lower chest: No acute abnormality.   Hepatobiliary: Postsurgical changes along the posterior margin of the liver, unchanged. No liver mass or focal lesion. Status post cholecystectomy. No bile duct dilation.   Pancreas: Unremarkable. No pancreatic ductal dilatation or surrounding inflammatory changes.   Spleen: Normal in size without focal abnormality.   Adrenals/Urinary Tract: No adrenal masses. Kidneys normal in size, orientation and position. No renal mass, stone or hydronephrosis. Normal ureters. Normal bladder.   Stomach/Bowel: Moderate increase in the colonic and rectal stool burden. No colonic wall thickening or inflammation. Stable anastomosis staple line at the rectosigmoid junction. No evidence of bowel obstruction. Stomach and small bowel are unremarkable. No evidence of appendicitis.   Vascular/Lymphatic: No significant vascular findings are present. No enlarged abdominal or pelvic lymph nodes.   Reproductive: 2.5 cm subserosal anterior uterine fibroid, stable. Right ovarian/adnexal cystic mass measuring 5.2 x 4.7 x 5.2 cm, not present on the prior CT.   Other: No ascites.   Musculoskeletal: No fracture  or acute finding. No bone lesion. Disc degenerative changes at L5-S1, stable from the prior CT.   IMPRESSION: 1. Right ovarian/adnexal cyst, 5.2 cm. Because this lesion is not adequately characterized, prompt [place] is recommended for further evaluation. Note: This recommendation does not apply to premenarchal patients and to those with increased risk (genetic, family history, elevated tumor markers or other high-risk factors) of  ovarian cancer. Reference: JACR 2020 Feb; 17(2):248-254 2. No other evidence of an acute abnormality within the abdomen or pelvis. 3. Moderate increase in the colonic and rectal stool burden. No bowel obstruction or inflammation. 4. No evidence of locally recurrent or metastatic colon carcinoma.   Assessment & Plan:   1. Cysts of both ovaries Pt reassured the new findings are unlikely to be a recurrence or extension of her colon cancer.  This is likely a new cyst as it was not present 11/22.  Advise expectant management for now with rescan in 6-8 weeks.  Will reassess in 2 months.  Pt is in agreement and will receive oral pain medicine for PRN use.   - US PELVIC COMPLETE WITH TRANSVAGINAL; Future - ibuprofen (ADVIL) 600 MG tablet; Take 1 tablet (600 mg total) by mouth every 6 (six) hours as needed for headache, mild pain, moderate pain or cramping.  Dispense: 30 tablet; Refill: 2 - HYDROcodone-acetaminophen (NORCO/VICODIN) 5-325 MG tablet; Take 1-2 tablets by mouth every 6 (six) hours as needed for severe pain.  Dispense: 12 tablet; Refill: 0   F/u in 2 months.   Griffin Basil, MD Faculty Attending, [facility]

## 2021-10-21 ENCOUNTER — Telehealth: Payer: Self-pay

## 2021-10-21 NOTE — Telephone Encounter (Signed)
Spoke with pt via telephone regarding recent ovarian cyst and CT scan.  Instructed pt to keep CT Scan ordered and to continue to follow-up with her GYN provider regarding her ovarian cyst.  Pt verbalized understanding and had no further questions at this time.

## 2021-10-25 ENCOUNTER — Telehealth: Payer: Self-pay | Admitting: *Deleted

## 2021-10-25 NOTE — Telephone Encounter (Signed)
TC from patient with concerns regarding [place] report. Seen by Dr. Elgie Congo for follow up but has since seen oncology and is concerned about need for biopsy to rule out recurrence/ metastasis of cancer. Advised patient to make virtual visit appointment to discuss further with Dr. Elgie Congo. Call transferred to front office for scheduling.

## 2021-10-29 ENCOUNTER — Other Ambulatory Visit: Payer: Medicaid Other

## 2021-11-01 ENCOUNTER — Ambulatory Visit: Payer: Medicaid Other | Admitting: Hematology

## 2021-11-07 ENCOUNTER — Other Ambulatory Visit: Payer: Self-pay

## 2021-11-07 ENCOUNTER — Telehealth: Payer: Self-pay

## 2021-11-07 DIAGNOSIS — C19 Malignant neoplasm of rectosigmoid junction: Secondary | ICD-10-CM

## 2021-11-08 ENCOUNTER — Telehealth: Payer: Self-pay

## 2021-11-08 NOTE — Telephone Encounter (Signed)
This nurse reached out to patient and made aware that MD changed order on CT scan. From Chest Abdomen and Pelvis to Chest only due to Abdomen and Pelvis being completed in January.  Advised that the date and time will remain the same.  No further questions and concerns noted at this time.

## 2021-11-12 ENCOUNTER — Inpatient Hospital Stay: Payer: Medicaid Other | Attending: Nurse Practitioner

## 2021-11-12 ENCOUNTER — Encounter (HOSPITAL_COMMUNITY): Payer: Self-pay

## 2021-11-12 ENCOUNTER — Ambulatory Visit (HOSPITAL_COMMUNITY)
Admission: RE | Admit: 2021-11-12 | Discharge: 2021-11-12 | Disposition: A | Discharge status: Home / Self Care | Payer: Medicaid Other | Source: Ambulatory Visit | Attending: Hematology | Admitting: Hematology

## 2021-11-12 ENCOUNTER — Other Ambulatory Visit: Payer: Self-pay

## 2021-11-12 DIAGNOSIS — N83201 Unspecified ovarian cyst, right side: Secondary | ICD-10-CM | POA: Insufficient documentation

## 2021-11-12 DIAGNOSIS — Z95828 Presence of other vascular implants and grafts: Secondary | ICD-10-CM

## 2021-11-12 DIAGNOSIS — C787 Secondary malignant neoplasm of liver and intrahepatic bile duct: Secondary | ICD-10-CM | POA: Insufficient documentation

## 2021-11-12 DIAGNOSIS — G620 Drug-induced polyneuropathy: Secondary | ICD-10-CM | POA: Insufficient documentation

## 2021-11-12 DIAGNOSIS — D696 Thrombocytopenia, unspecified: Secondary | ICD-10-CM | POA: Insufficient documentation

## 2021-11-12 DIAGNOSIS — N83202 Unspecified ovarian cyst, left side: Secondary | ICD-10-CM | POA: Insufficient documentation

## 2021-11-12 DIAGNOSIS — C19 Malignant neoplasm of rectosigmoid junction: Secondary | ICD-10-CM | POA: Diagnosis not present

## 2021-11-12 LAB — CBC WITH DIFFERENTIAL (CANCER CENTER ONLY)
Abs Immature Granulocytes: 0.02 10*3/uL (ref 0.00–0.07)
Basophils Absolute: 0 10*3/uL (ref 0.0–0.1)
Basophils Relative: 0 %
Eosinophils Absolute: 0.1 10*3/uL (ref 0.0–0.5)
Eosinophils Relative: 1 %
HCT: 41.7 % (ref 36.0–46.0)
Hemoglobin: 14.5 g/dL (ref 12.0–15.0)
Immature Granulocytes: 0 %
Lymphocytes Relative: 17 %
Lymphs Abs: 1.2 10*3/uL (ref 0.7–4.0)
MCH: 32.4 pg (ref 26.0–34.0)
MCHC: 34.8 g/dL (ref 30.0–36.0)
MCV: 93.1 fL (ref 80.0–100.0)
Monocytes Absolute: 0.4 10*3/uL (ref 0.1–1.0)
Monocytes Relative: 6 %
Neutro Abs: 5.1 10*3/uL (ref 1.7–7.7)
Neutrophils Relative %: 76 %
Platelet Count: 114 10*3/uL — ABNORMAL LOW (ref 150–400)
RBC: 4.48 MIL/uL (ref 3.87–5.11)
RDW: 12.4 % (ref 11.5–15.5)
WBC Count: 6.9 10*3/uL (ref 4.0–10.5)
nRBC: 0 % (ref 0.0–0.2)

## 2021-11-12 LAB — CMP (CANCER CENTER ONLY)
ALT: 21 U/L (ref 0–44)
AST: 20 U/L (ref 15–41)
Albumin: 3.9 g/dL (ref 3.5–5.0)
Alkaline Phosphatase: 41 U/L (ref 38–126)
Anion gap: 6 (ref 5–15)
BUN: 9 mg/dL (ref 6–20)
CO2: 26 mmol/L (ref 22–32)
Calcium: 8.7 mg/dL — ABNORMAL LOW (ref 8.9–10.3)
Chloride: 104 mmol/L (ref 98–111)
Creatinine: 0.65 mg/dL (ref 0.44–1.00)
GFR, Estimated: >60 mL/min (ref >60)
Glucose, Bld: 94 mg/dL (ref 70–99)
Potassium: 4.1 mmol/L (ref 3.5–5.1)
Sodium: 136 mmol/L (ref 135–145)
Total Bilirubin: 0.6 mg/dL (ref 0.3–1.2)
Total Protein: 6.7 g/dL (ref 6.5–8.1)

## 2021-11-12 LAB — CEA (IN HOUSE-CHCC): CEA (CHCC-In House): 2.66 ng/mL (ref 0.00–5.00)

## 2021-11-12 IMAGING — CT CT CHEST W/O CM
2 of 4 series · 15 of 36 positions shown, 18 images · non-contrast
Comparison: CT abdomen pelvis, [DATE], CT chest, [DATE]

CLINICAL DATA: Metastatic colorectal cancer, rule out lung
metastasis



[Series 2: thorax · axial · 0.68mm/px · z∈[-291,-9]mm · 12 of 165 slices shown, 15 images]
[im 12/165  mediastinal]
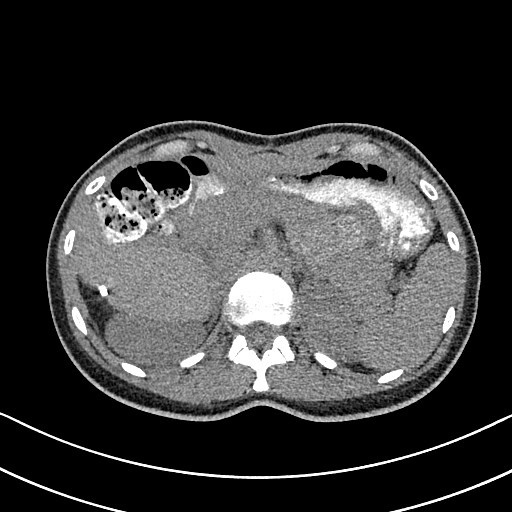
[im 12/165  lung]
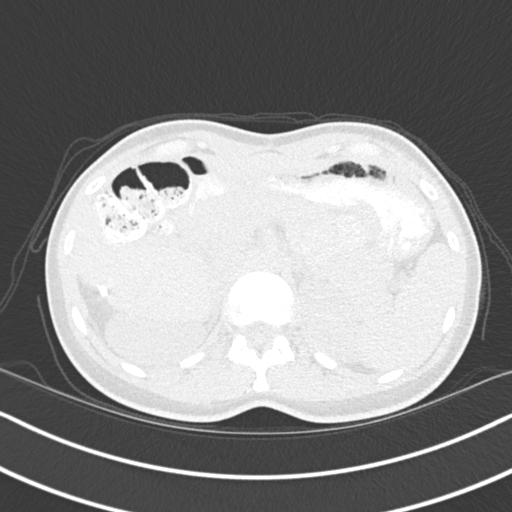
[im 24/165  lung]
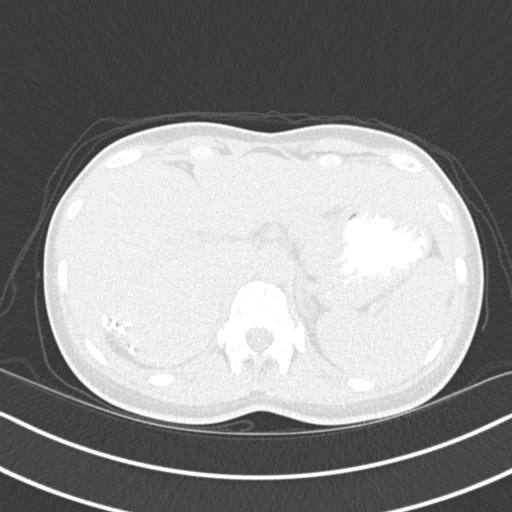
[im 36/165  lung]
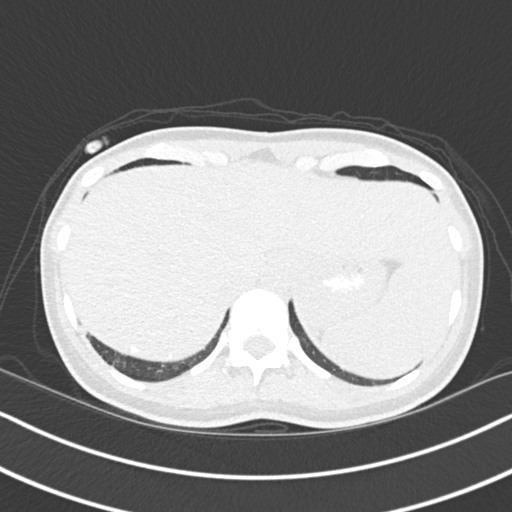
[im 47/165  lung]
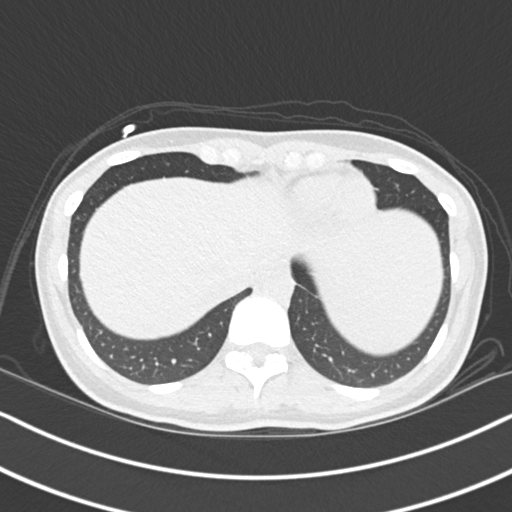
[im 59/165  mediastinal]
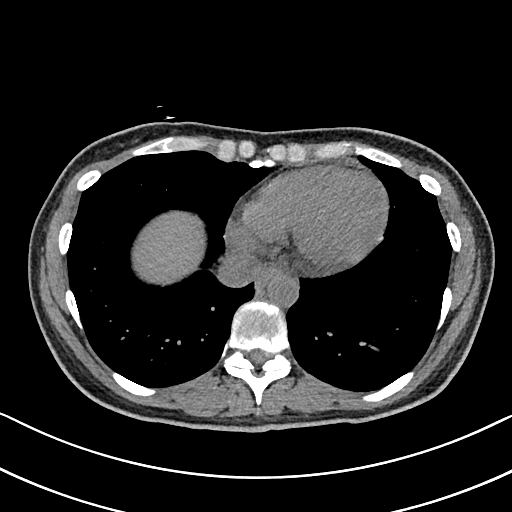
[im 59/165  lung]
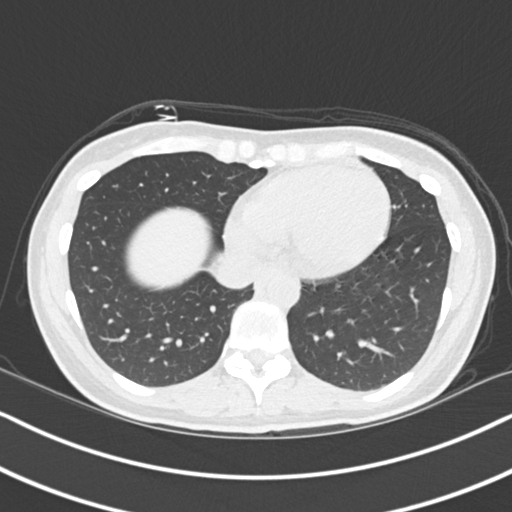
[im 71/165  lung]
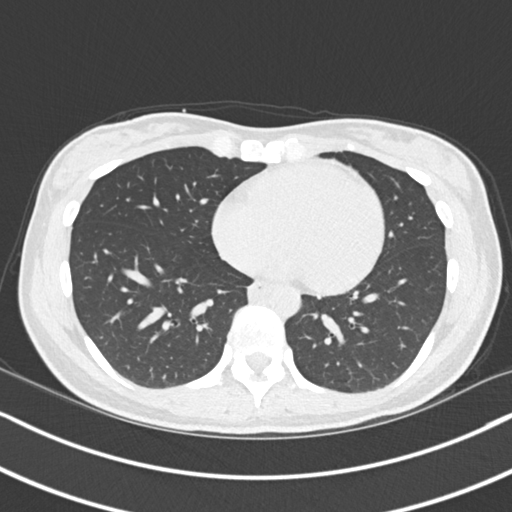
[im 94/165  lung]
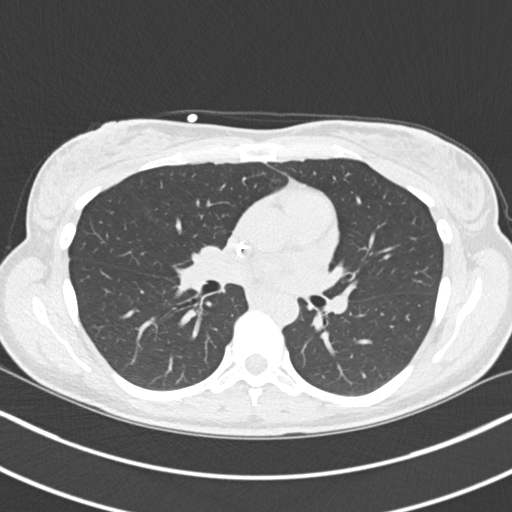
[im 106/165  lung]
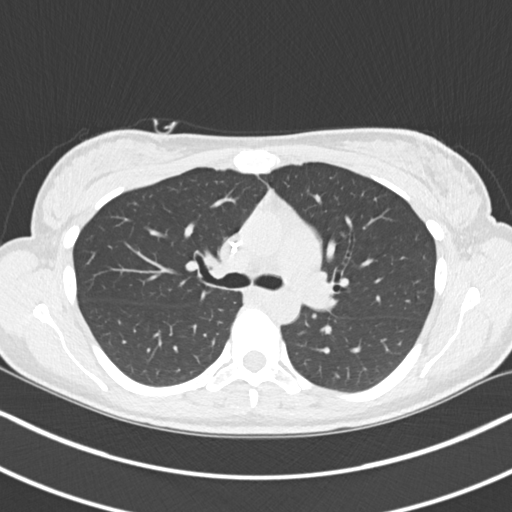
[im 118/165  mediastinal]
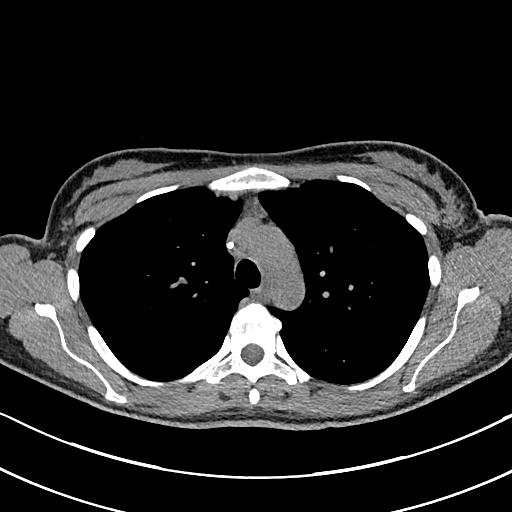
[im 118/165  lung]
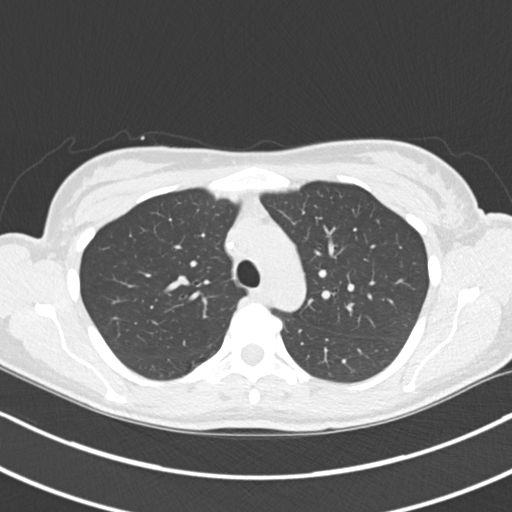
[im 129/165  lung]
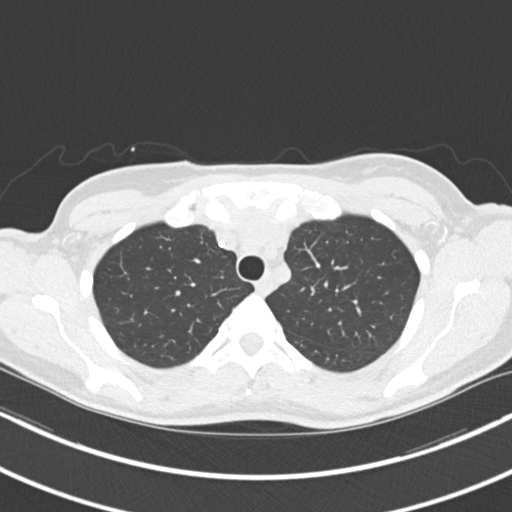
[im 141/165  lung]
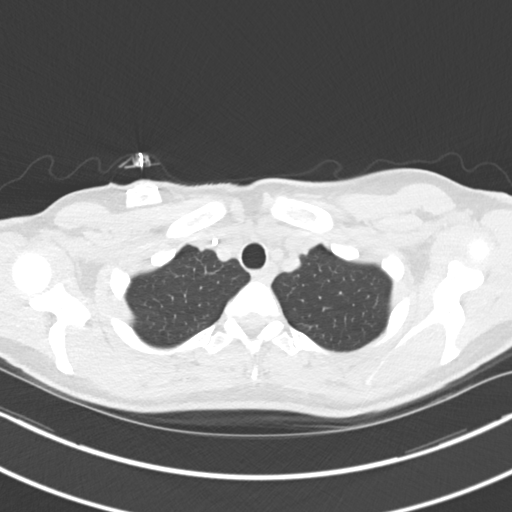
[im 153/165  lung]
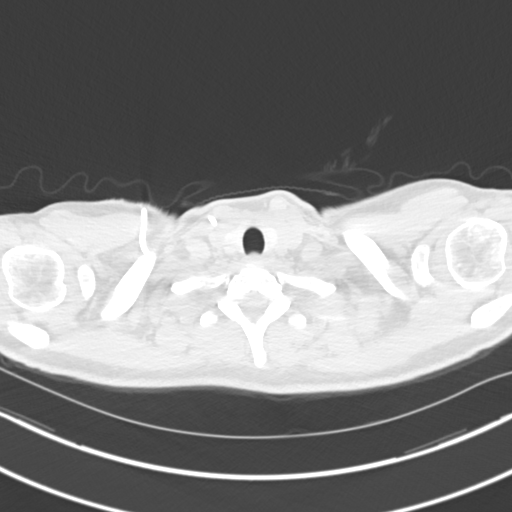

[Series 5: coronal · coronal · 0.65mm/px · 3 of 121 slices shown]
[im 25/121  lung]
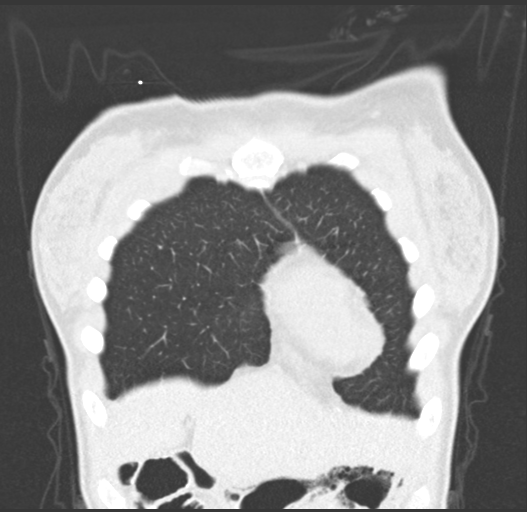
[im 49/121  lung]
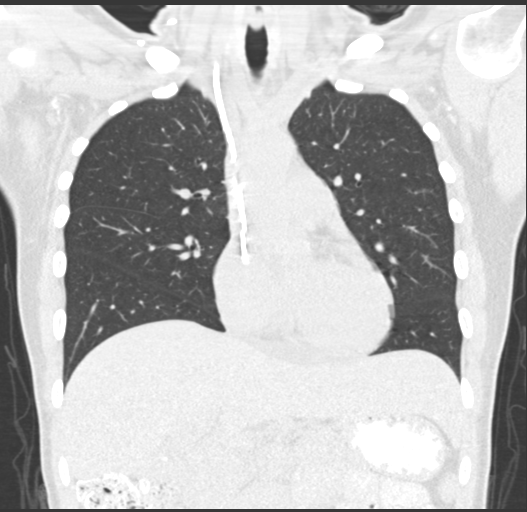
[im 73/121  lung]
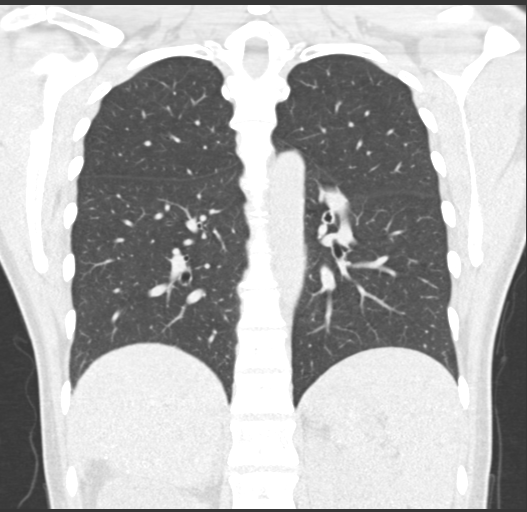

[15 of 36 positions shown; findings below may reference images not displayed]

FINDINGS: Cardiovascular: Right chest port catheter. Normal heart size. No
pericardial effusion.

Mediastinum/Nodes: No enlarged mediastinal, hilar, or axillary lymph
nodes. Thymic remnant in the anterior mediastinum. Thyroid gland,
trachea, and esophagus demonstrate no significant findings.

Lungs/Pleura: Lungs are clear. No pleural effusion or pneumothorax.

Upper Abdomen: No acute abnormality. Surgical clips about the
posterior right lobe of the liver (series 2, image 142).

Musculoskeletal: No chest wall abnormality. No suspicious osseous
lesions identified.
IMPRESSION: 1. No evidence of pulmonary metastatic disease.
2. Stable postoperative findings about the posterior right lobe of
the liver.

## 2021-11-12 MED ORDER — HEPARIN SOD (PORK) LOCK FLUSH 100 UNIT/ML IV SOLN
500.0000 [IU] | Freq: Once | INTRAVENOUS | Status: AC
  Administered 2021-11-12: 500 [IU] via INTRAVENOUS

## 2021-11-12 MED ORDER — HEPARIN SOD (PORK) LOCK FLUSH 100 UNIT/ML IV SOLN
INTRAVENOUS | Status: AC
  Filled 2021-11-12: qty 5

## 2021-11-12 MED ORDER — SODIUM CHLORIDE 0.9% FLUSH
10.0000 mL | Freq: Once | INTRAVENOUS | Status: AC
  Administered 2021-11-12: 10 mL

## 2021-11-15 ENCOUNTER — Inpatient Hospital Stay (HOSPITAL_BASED_OUTPATIENT_CLINIC_OR_DEPARTMENT_OTHER): Payer: Medicaid Other | Admitting: Hematology

## 2021-11-15 ENCOUNTER — Other Ambulatory Visit: Payer: Self-pay

## 2021-11-15 VITALS — BP 109/73 | HR 76 | Resp 17 | Wt 148.2 lb

## 2021-11-15 DIAGNOSIS — G620 Drug-induced polyneuropathy: Secondary | ICD-10-CM | POA: Diagnosis not present

## 2021-11-15 DIAGNOSIS — C19 Malignant neoplasm of rectosigmoid junction: Secondary | ICD-10-CM

## 2021-11-15 DIAGNOSIS — C787 Secondary malignant neoplasm of liver and intrahepatic bile duct: Secondary | ICD-10-CM | POA: Diagnosis not present

## 2021-11-15 DIAGNOSIS — D696 Thrombocytopenia, unspecified: Secondary | ICD-10-CM | POA: Diagnosis not present

## 2021-11-15 DIAGNOSIS — N83202 Unspecified ovarian cyst, left side: Secondary | ICD-10-CM | POA: Diagnosis not present

## 2021-11-15 DIAGNOSIS — N83201 Unspecified ovarian cyst, right side: Secondary | ICD-10-CM | POA: Diagnosis not present

## 2021-11-15 NOTE — Progress Notes (Addendum)
[place]   [phone] [phone]   Clinic Follow up Note   Patient Care Team: [facility] as PCP - General [name], MD as Consulting Physician (Oncology) [name], MD as Consulting Physician (General Surgery) [name], NP as Nurse Practitioner (Nurse Practitioner) Griffin Basil, MD as Consulting Physician (Obstetrics and Gynecology)  Date of Service:  11/15/2021  CHIEF COMPLAINT: f/u of metastatic colon cancer  CURRENT THERAPY:  Surveillance   ASSESSMENT & PLAN:  Stephanie [name] is a 41 y.o. female with   1. 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 She was found to have perforation and positive radial margin. Liver biopsy on 04/16/2020 confirmed metastatic disease from her colon cancer -She received first line chemo with FOLFOXIRI q2 weeks for 6 months from 05/23/20 to 10/24/20. Bevacizumab-bvzr [name] added with C2.  -She underwent liver resection on 12/13/20 under Dr. Barry Dienes. Pathology showed: metastatic colon carcinoma to liver showing approximately 80% necrosis, resection margin negative.  -She underwent colostomy takedown on 05/30/21 with Dr. Barry Dienes. Pathology was benign. -since her last visit, she presented to ED 10/04/21 with back pain and vaginal bleeding. Physical exam was negative. CT AP showed a 5.2 cm right ovarian/adnexal cyst, otherwise NED. -restaging chest CT 11/12/21 was negative. -she is clinically doing well except her low back pain which is probably related to her ovarian cyst. Labs from 11/12/21 reviewed, overall no concerns, CEA was WNL. Physical exam was unremarkable. -she still has her port in place, so we will continue flush every 2 months. I will see her back in 6 months with repeat CT scan,  she is scheduled to see Dr. Barry Dienes in 3 months    2.  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 She notes she does drop small things. She rates it has 5/10. -She was enrolled in our clinical study on 03/07/21 but opted to withdraw on 03/29/21.   3. Mild thrombocytopenia -Secondary to chemotherapy -overall stable, 114k on 11/12/21   4. Genetics  -Due to her young age, I recommended genetic testing to ruled out cancer syndrome.  She initially wanted to wait due to her insurance issue -Genetics consult on 12/04/20, genetic test was drawn 01/17/21 -Results were negative   5. Ovarian cysts, bilateral -new on recent CT scan in Jan 2023, right side 5.2cm, causes a lot of pain for her -right cyst seen on 10/04/21 pelvic US, new from 07/2021. -she is being followed by GYN, with repeat pelvic US scheduled for 11/19/21.     Plan: -lab and flush in 2 and 4 months -f/u in 6 months with lab, flush, and CT CAP several days before (contrast given today) -she will see Dr. Barry Dienes in 3 months  -I will reach out to Dr. Elgie Congo regarding her ovarian cyst removal    No problem-specific Assessment & Plan notes found for this encounter.   SUMMARY OF ONCOLOGIC HISTORY: Oncology History Overview Note  Cancer Staging Malignant neoplasm of rectosigmoid junction [facility]) Staging form: Colon and Rectum, AJCC 8th Edition - Pathologic stage from 04/04/2020: pT4a, pN1b, cM1 - Signed by [name], NP on 05/07/2020    Malignant neoplasm of rectosigmoid junction ([place])  11/10/2019 Imaging   MRI Abdomen  IMPRESSION: 1. Redemonstrated hypoenhancing lesion of  the posterior liver dome, hepatic segment VII, reduced in size compared to prior examination, measuring 1.3 x 1.3 cm, previously 1.8 x 1.7 cm. Findings are consistent with treatment response of a biopsy proven metastasis. No other evidence of lymphadenopathy or metastatic disease within the abdomen or pelvis. 2.  Unchanged mild splenomegaly, maximum coronal span 14.0 cm. 3. 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   03/12/2020 Initial Diagnosis   Malignant neoplasm of rectosigmoid junction ([place])   03/12/2020 Imaging   CT AP with contrast IMPRESSION: 1. Overall findings are highly concerning for colorectal carcinoma involving the sigmoid colon with an associated perforation and adjacent abscess and phlegmon formation as detailed above. Currently, no collection is amenable to percutaneous drainage given their small size and location. 2. New 2 cm mass in the right hepatic lobe concerning for metastatic disease to the liver until proven otherwise. 3. Enlarged regional lymph nodes as detailed above is concerning for nodal metastatic disease. 4. Large stool burden. 5. Prominent pelvic veins which can be seen in patients with pelvic congestion syndrome.   03/13/2020 Imaging   ABD US IMPRESSION: Approximately 2.1 x 2.4 x 2.0 cm lobular homogeneously echogenic lesion in the right hepatic dome corresponds with the abnormality seen on the prior CT scan. Sonographically, this appearance is highly suggestive of a benign hemangioma.   Recommend MRI of the abdomen with gadolinium contrast which may provide a noninvasive diagnosis of benign hemangioma.    03/13/2020 Imaging   MR ABD W/WO CONTRAST Hepatobiliary: Diffuse low signal intensity throughout the hepatic parenchyma on T2 weighted images, presumably a consequence of recent Feraheme injection. In segment 7 of the liver (axial image 8 of series 5) there is a 2.5 x 1.9 cm well-defined lesion which is slightly T2 hyperintense. This lesion appears hyperintense on pre gadolinium T1 weighted images (likely a consequence of Feraheme). Interpretation of enhancement within the lesion is compromised by presence of Feraheme. No other hepatic lesions are confidently identified on today's examination. No intra or extrahepatic  biliary ductal dilatation. Gallbladder is normal in appearance.   03/31/2020 Imaging   CT AP W contrast IMPRESSION: 1. Previously noted sigmoid colon mass appears increased in size, and again appears to be associated with a focal contained perforation which crosses the midline and has fistulized into the left adnexal region where there is now what appears to be a large left tubo-ovarian abscess, as detailed above. This is also associated with multiple enlarged lymph nodes in the pelvis measuring up to 1.2 cm in short axis and borderline enlarged retroperitoneal lymph nodes, concerning for metastatic disease. In addition, previously suspected metastatic lesion in segment 7 of the liver has enlarged. 2. Small volume of ascites. 3. Additional incidental findings, as above.   04/04/2020 Cancer Staging   Staging form: Colon and Rectum, AJCC 8th Edition - Pathologic stage from 04/04/2020: pT4a, pN1b, cM1 - Signed by [name], NP on 05/07/2020    04/04/2020 Procedure   Paracentesis, path showed no malignant cells (mixed acute and chronic inflammation present)   04/04/2020 Surgery   Open sigmoid colectomy and end colostomy by Dr. [name]   04/04/2020 Pathology Results   FINAL MICROSCOPIC DIAGNOSIS: A. COLON, RECTOSIGMOID, RESECTION: - Invasive moderately differentiated adenocarcinoma, 6 cm, involving rectosigmoid junction - Carcinoma invades into serosal surface with perforation and associated serositis - Radial resection margin is positive for carcinoma; proximal and distal margins are not involved - Lymphovascular invasion is present - Metastatic carcinoma to one of fifteen  lymph nodes (1/15); one tumor deposit - See oncology table B. LYMPH NODES, MESENTERIC, RESECTION: - Metastatic adenocarcinoma to one of six lymph nodes (1/6) - One tumor deposit  Addendum to note 2 involved lymph nodes (of 21 examined nodes) pT4a,pN1b MMR-normal, preserved expression of MLH1, MSH2, MSH6, PMS2    04/12/2020 Procedure   PAC placement    04/13/2020 Imaging   CT chest without contrast IMPRESSION: Interval development of bilateral pleural effusions, left slightly greater than right, with resultant bibasilar atelectasis including subtotal collapse of the left lower lobe. No evidence of intrathoracic metastatic disease, though evaluation of the collapsed parenchyma is limited. Hepatic metastasis again demonstrated.     04/16/2020 Pathology Results   FINAL MICROSCOPIC DIAGNOSIS:  A. LIVER, RIGHT LOBE, BIOPSY:  - Adenocarcinoma.  COMMENT:  The morphology is compatible with the provided clinical history of colorectal carcinoma.    05/23/2020 - 10/24/2020 Chemotherapy   FOLFIRINOX q2weeks for 3-6 months starting 05/23/20. Bevacizumab-bvzr [name] added with C2. Oxaliplatin held with C11-12 due to reaction. (pt developed SOB, chest palpitation and abdominal discomfort shortly after oxaliplatin started). Completed on 10/24/20.   08/05/2020 Imaging   CT AP  IMPRESSION: 1. Postsurgical changes of distal colectomy with a left lower quadrant end ostomy. No evidence of obstruction or acute complication at this time. Excluded rectal pouch in the deep pelvis without acute complication or worrisome features. 2. Slight interval decrease in size of a hypoattenuating lesion posterior right lobe liver measuring 1.7 x 1.8 x 2 cm. This lesion has previously undergone ultrasound-guided biopsy with pathologic results demonstrating adenocarcinoma compatible with metastatic disease from patient's resected colorectal carcinoma. 3. Slight prominence of the parametrial vessels bilaterally, nonspecific though can be seen in the setting of pelvic congestion syndrome. 4. Mild splenomegaly.  No focal lesion.     11/12/2020 Imaging   CT Chest  IMPRESSION: 1. No evidence of metastatic disease in the chest. 2. 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   12/13/2020  Surgery   A. 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 The report date is 01/28/2021.   The CustomNext-Cancer+RNAinsight panel offered by [facility] included sequencing and rearrangement analysis for the following 47 genes:  APC, ATM, AXIN2, BARD1, BMPR1A, BRCA1, BRCA2, BRIP1, CDH1, CDK4, CDKN2A, CHEK2, DICER1, EPCAM, GREM1, HOXB13, MEN1, MLH1, MSH2, MSH3, MSH6, MUTYH, NBN, NF1, NF2, NTHL1, PALB2, PMS2, POLD1, POLE, PTEN, RAD51C, RAD51D, RECQL, RET, SDHA, SDHAF2, SDHB, SDHC, SDHD, SMAD4, SMARCA4, STK11, TP53, TSC1, TSC2, and VHL.  RNA data is routinely analyzed for use in variant interpretation for all genes.   02/19/2021 Imaging   CT A/P w/o contrast  IMPRESSION: Slightly limited examination examination in absence of contrast administration. Status post partial right hepatectomy. Interval decrease in size in perihepatic fluid collection and resolution of right subdiaphragmatic fluid and gas. No new intra-abdominal fluid collections are identified.   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 Moderate stool throughout the colon without evidence of obstruction.   Fluid distension of the proximal duodenum to the level of the SMA hiatus which appears narrow. 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   05/05/2021 Imaging   CT AP  IMPRESSION: Postsurgical changes as described stable in appearance from the prior exam.   Changes suggestive of mild  pelvic varices.   08/22/2021 Imaging   EXAM: CT ABDOMEN AND PELVIS WITH CONTRAST  IMPRESSION: 1. Extensive bowel content is identified throughout the colon consistent with constipation. 2. 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 3. Stable postoperative changes in the right lobe liver.   09/12/2021 Survivorship   SCP delivered by Cira Rue, NP   10/05/2021 Imaging   EXAM: CT ABDOMEN AND PELVIS WITH CONTRAST  IMPRESSION: 1. Right ovarian/adnexal cyst, 5.2 cm. Because this lesion is not adequately characterized, prompt [place] is recommended for further evaluation. Note: This recommendation does not apply to premenarchal patients and to those with increased risk (genetic, family history, elevated tumor markers or other high-risk factors) of ovarian cancer. Reference: JACR 2020 Feb; 17(2):248-254 2. No other evidence of an acute abnormality within the abdomen or pelvis. 3. Moderate increase in the colonic and rectal stool burden. No bowel obstruction or inflammation. 4. No evidence of locally recurrent or metastatic colon carcinoma.   11/12/2021 Imaging   EXAM: CT CHEST WITHOUT CONTRAST  IMPRESSION: 1. No evidence of pulmonary metastatic disease. 2. Stable postoperative findings about the posterior right lobe of the liver.      INTERVAL HISTORY:  Stephanie [name] is here for a follow up of metastatic colon cancer. She was last seen by me on 08/28/21 with survivorship and symptom management visits in the interim. She presents to the clinic alone. She reports she remains in pain from her ovarian cysts. She notes she manages with tylenol and CBD.  She notes she is having more gas, but denies issues with her bowel movements. 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   All other systems were reviewed with the patient and are negative.  MEDICAL HISTORY:  Past Medical History:  Diagnosis Date    Anxiety    Blood transfusion without reported diagnosis    Bowel obstruction (HCC)    BV (bacterial vaginosis)    Colon cancer ([place])    Colon Cancer 03-2020   Colon polyps    Colostomy present [facility])    03-2020   Family history of bladder cancer    History of chemotherapy    ended 09-2020   Lactose intolerance 03/12/2020   Neuromuscular disorder (HCC)    neuropathy feet hands legs   UTI (lower urinary tract infection)     SURGICAL HISTORY: Past Surgical History:  Procedure Laterality Date   CESAREAN SECTION     CHOLECYSTECTOMY  12/13/2020   COLECTOMY  03/2020   COLONOSCOPY     COLOSTOMY TAKEDOWN N/A 05/30/2021   Procedure: LAPAROSCOPIC ASSISTED COLOSTOMY TAKEDOWN;  Surgeon: [name], MD;  Location: [place];  Service: General;  Laterality: N/A;   CYSTOSCOPY WITH STENT PLACEMENT  04/04/2020   Procedure: CYSTOSCOPY WITH STENT PLACEMENT;  Surgeon: [name], MD;  Location: [facility];  Service: General;;   LAPAROSCOPIC LIVER ULTRASOUND N/A 12/13/2020   Procedure: INTRAOPERATIVE LIVER ULTRASOUND;  Surgeon: [name], MD;  Location: [place];  Service: General;  Laterality: N/A;   LAPAROSCOPY N/A 12/13/2020   Procedure: LAPAROSCOPY DIAGNOSTIC;  Surgeon: [name], MD;  Location: [place];  Service: General;  Laterality: N/A;   LAPAROTOMY N/A 04/04/2020   Procedure:  EXPLORATORY LAPAROTOMY;  Surgeon: [name], MD;  Location: [facility];  Service: General;  Laterality: N/A;   OPEN PARTIAL HEPATECTOMY  N/A 12/13/2020   Procedure: OPEN PARTIAL HEPATECTOMY;  Surgeon: [name], MD;  Location: [place];  Service: General;  Laterality: N/A;  ROOM 2 STARTING AT 09:30AM FOR 300 MIN   PORTACATH PLACEMENT Right 04/12/2020   Procedure: INSERTION PORT-A-CATH WITH ULTRASOUND;  Surgeon: Mickeal Skinner, MD;  Location: [facility];  Service: General;  Laterality: Right;    I have reviewed the social history and family history with the patient and they are unchanged from previous  note.  ALLERGIES:  is allergic to oxaliplatin.  MEDICATIONS:  Current Outpatient Medications  Medication Sig Dispense Refill   acetaminophen (TYLENOL) 325 MG tablet Take 2 tablets (650 mg total) by mouth every 6 (six) hours as needed for mild pain, moderate pain, fever or headache (fever > 101).     cyclobenzaprine (FLEXERIL) 10 MG tablet Take 1 tablet (10 mg total) by mouth 3 (three) times daily as needed for muscle spasms. 30 tablet 0   HYDROcodone-acetaminophen (NORCO/VICODIN) 5-325 MG tablet Take 1-2 tablets by mouth every 6 (six) hours as needed for severe pain. 12 tablet 0   ibuprofen (ADVIL) 600 MG tablet Take 1 tablet (600 mg total) by mouth every 6 (six) hours as needed for headache, mild pain, moderate pain or cramping. 30 tablet 2   Lactic Ac-Citric Ac-Pot Bitart (PHEXXI) 1.8-1-0.4 % GEL Place 5 g vaginally as needed. (Patient not taking: Reported on 08/22/2021) 60 g 1   meloxicam (MOBIC) 7.5 MG tablet Take 1 tablet (7.5 mg total) by mouth daily. (Patient not taking: Reported on 08/22/2021) 20 tablet 0   Norethindrone Acetate-Ethinyl Estradiol (LOESTRIN 1.5/30, 21,) 1.5-30 MG-MCG tablet Take 1 tablet by mouth daily. 28 tablet 6   Prenatal Vit-Fe Fumarate-FA (PRENATAL MULTIVITAMIN) TABS tablet Take 1 tablet by mouth daily at 12 noon.     No current facility-administered medications for this visit.    PHYSICAL EXAMINATION: ECOG PERFORMANCE STATUS: 1 - Symptomatic but completely ambulatory  Vitals:   11/15/21 0841  BP: 109/73  Pulse: 76  Resp: 17  SpO2: 98%   Wt Readings from Last 3 Encounters:  11/15/21 148 lb 3 oz (67.2 kg)  10/08/21 146 lb (66.2 kg)  10/04/21 148 lb (67.1 kg)     GENERAL:alert, no distress and comfortable SKIN: skin color, texture, turgor are normal, no rashes or significant lesions EYES: normal, Conjunctiva are pink and non-injected, sclera clear  NECK: supple, thyroid normal size, non-tender, without nodularity LYMPH:  no palpable lymphadenopathy  in the cervical, axillary  LUNGS: clear to auscultation and percussion with normal breathing effort HEART: regular rate & rhythm and no murmurs and no lower extremity edema ABDOMEN:abdomen soft, non-tender and normal bowel sounds Musculoskeletal:no cyanosis of digits and no clubbing  NEURO: alert & oriented x 3 with fluent speech, no focal motor/sensory deficits  LABORATORY DATA:  I have reviewed the data as listed CBC Latest Ref Rng & Units 11/12/2021 10/05/2021 09/12/2021  WBC 4.0 - 10.5 K/uL 6.9 4.8 5.9  Hemoglobin 12.0 - 15.0 g/dL 14.5 14.8 14.0  Hematocrit 36.0 - 46.0 % 41.7 44.0 41.5  Platelets 150 - 400 K/uL 114(L) 103(L) 127(L)     CMP Latest Ref Rng & Units 11/12/2021 10/05/2021 09/12/2021  Glucose 70 - 99 mg/dL 94 85 91  BUN 6 - 20 mg/dL _0 Creatinine 0.44 - 1.00 mg/dL 0.65 0.69 0.73  Sodium  135 - 145 mmol/L 136 134(L) 140  Potassium 3.5 - 5.1 mmol/L 4.1 3.6 4.1  Chloride 98 - 111 mmol/L 104 105 108  CO2 22 - 32 mmol/L _0 Calcium 8.9 - 10.3 mg/dL 8.7(L) 8.5(L) 8.8(L)  Total Protein 6.5 - 8.1 g/dL 6.7 6.4(L) 6.8  Total Bilirubin 0.3 - 1.2 mg/dL 0.6 0.7 0.8  Alkaline Phos 38 - 126 U/L 41 38 52  AST 15 - 41 U/L 20 14(L) 15  ALT 0 - 44 U/L _1 RADIOGRAPHIC STUDIES: I have personally reviewed the radiological images as listed and agreed with the findings in the report. No results found.    Orders Placed This Encounter  Procedures   CT CHEST ABDOMEN PELVIS W CONTRAST    Standing Status:   Future    Standing Expiration Date:   11/15/2022    Order Specific Question:   Is patient pregnant?    Answer:   No    Order Specific Question:   Preferred imaging location?    Answer:   [facility]    Order Specific Question:   Release to patient    Answer:   Immediate    Order Specific Question:   Is Oral Contrast requested for this exam?    Answer:   Yes, Per Radiology protocol   All questions were answered. The patient knows to call the clinic  with any problems, questions or concerns. No barriers to learning was detected. The total time spent in the appointment was 30 minutes.     [name], MD 11/15/2021   I, Wilburn Mylar, am acting as scribe for [name], MD.   I have reviewed the above documentation for accuracy and completeness, and I agree with the above.

## 2021-11-18 ENCOUNTER — Telehealth: Payer: Self-pay

## 2021-11-18 NOTE — Telephone Encounter (Signed)
Spoke with pt via telephone regarding Pelvic US and f/u w/Dr. Elgie Congo.  Pt stated she has her pelvic US on 11/19/2021 and f/u w/Dr. Elgie Congo on Thursday 11/21/2021.  Dr. Burr Medico spoke with Dr. Elgie Congo & Dr. Barry Dienes regarding pt's lower back pain, pelvic pain, and large rt ovarian cyst.  Pt will contact Dr. Ernestina Penna office or send MyChart message after f/u appt with Dr. Elgie Congo on 11/21/2021.  Notified Dr. Burr Medico of pt's upcoming appts.

## 2021-11-19 ENCOUNTER — Ambulatory Visit (HOSPITAL_COMMUNITY)
Admission: RE | Admit: 2021-11-19 | Discharge: 2021-11-19 | Disposition: A | Discharge status: Home / Self Care | Payer: Medicaid Other | Source: Ambulatory Visit | Attending: Obstetrics and Gynecology | Admitting: Obstetrics and Gynecology

## 2021-11-19 ENCOUNTER — Other Ambulatory Visit: Payer: Self-pay

## 2021-11-19 ENCOUNTER — Encounter: Payer: Self-pay | Admitting: Hematology

## 2021-11-19 DIAGNOSIS — N83201 Unspecified ovarian cyst, right side: Secondary | ICD-10-CM | POA: Diagnosis present

## 2021-11-19 DIAGNOSIS — N83202 Unspecified ovarian cyst, left side: Secondary | ICD-10-CM | POA: Insufficient documentation

## 2021-11-19 IMAGING — US US PELVIS COMPLETE WITH TRANSVAGINAL
1 series · 13 of 25 positions shown · non-contrast
Comparison: [DATE], [DATE] ultrasound exam; CT abdomen and
pelvis [DATE]

CLINICAL DATA: Ovarian cysts bilaterally, follow-up

EXAM:
TRANSABDOMINAL AND TRANSVAGINAL ULTRASOUND OF PELVIS
TECHNIQUE: Both transabdominal and transvaginal ultrasound examinations of the
pelvis were performed. Transabdominal technique was performed for
global imaging of the pelvis including uterus, ovaries, adnexal
regions, and pelvic cul-de-sac. It was necessary to proceed with
endovaginal exam following the transabdominal exam to visualize the
endometrium and ovaries.

[Series 1: us pelvic complete with transvaginal · 90 acquisitions, 13 frames shown]
[im 1/90]
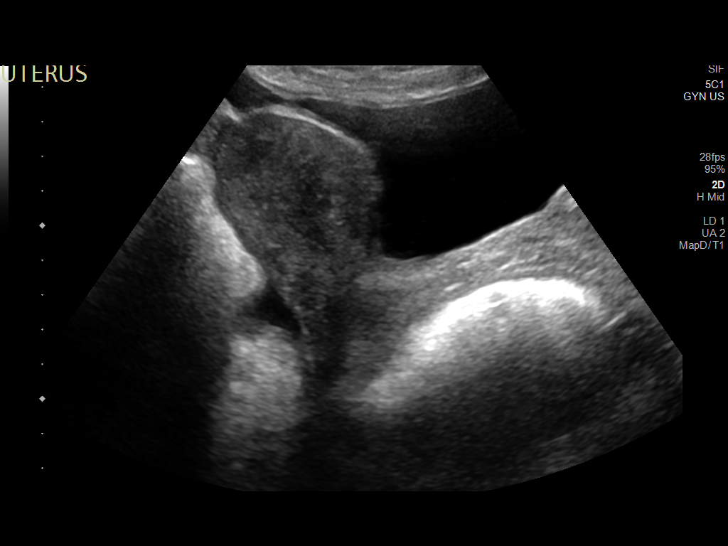
[im 8/90]
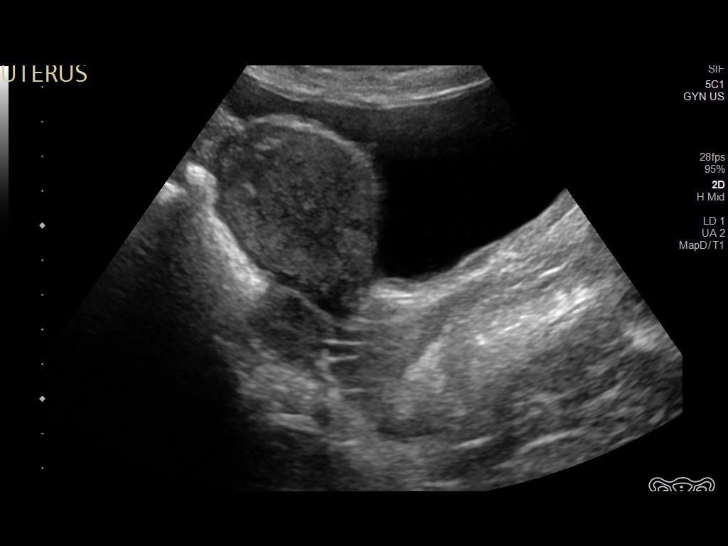
[im 15/90]
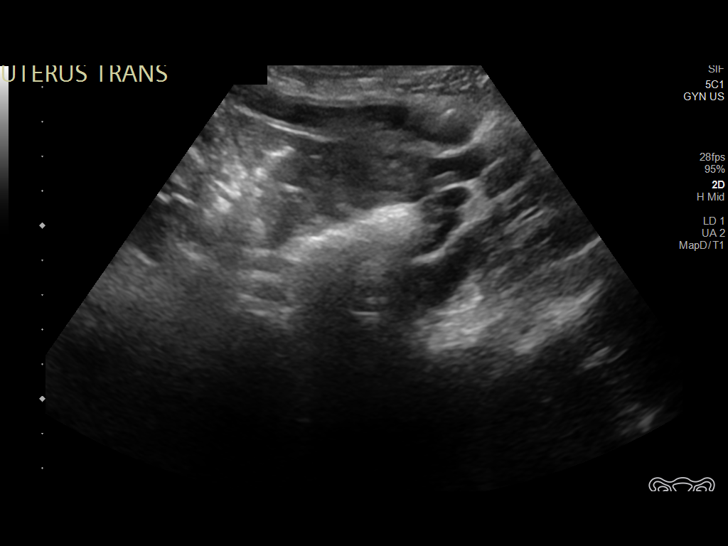
[im 23/90]
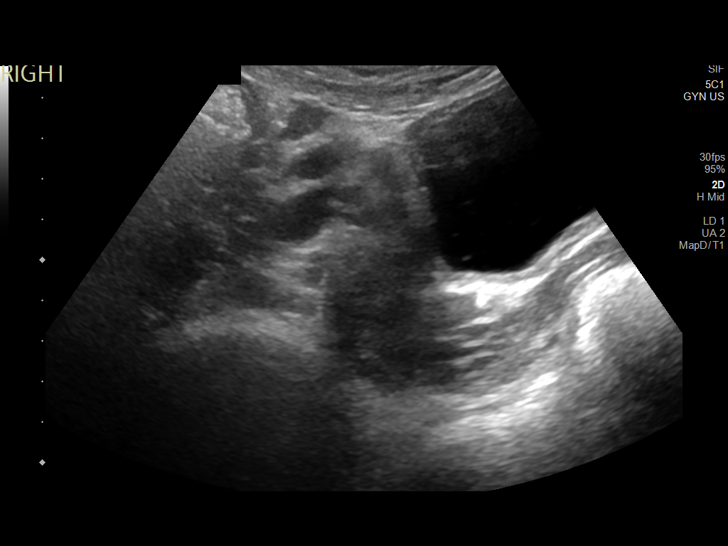
[im 30/90]
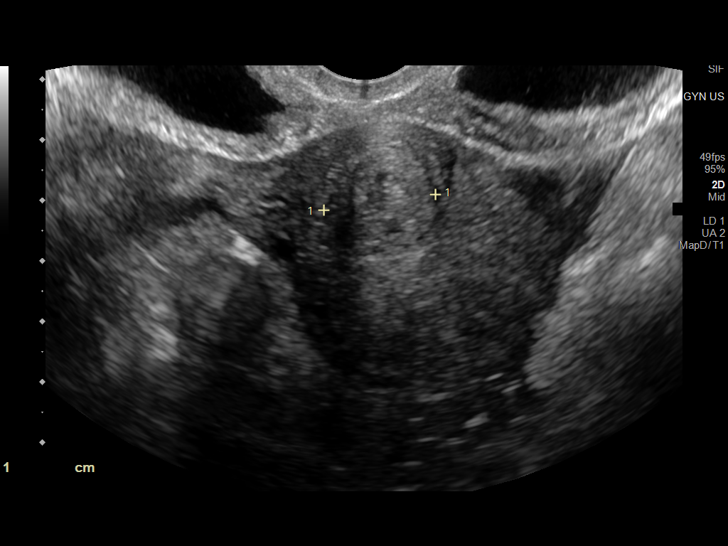
[im 38/90]
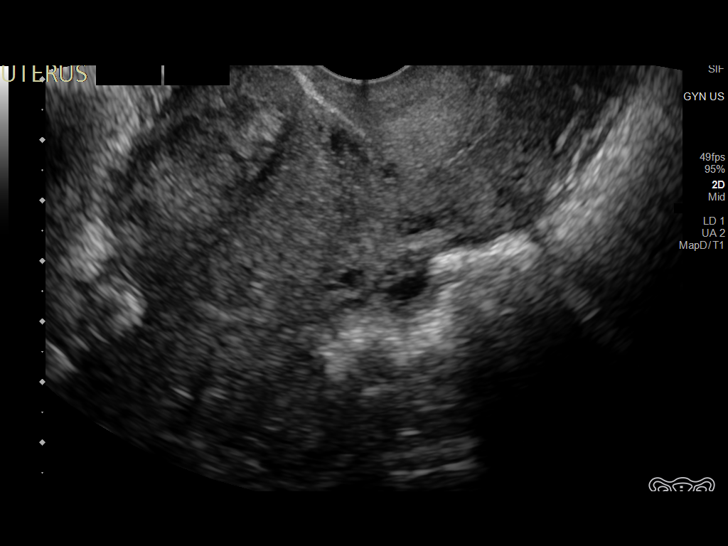
[im 45/90]
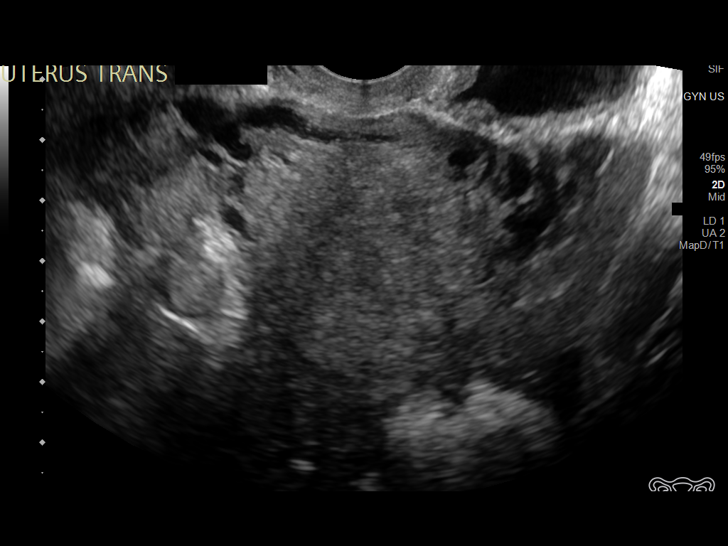
[im 52/90]
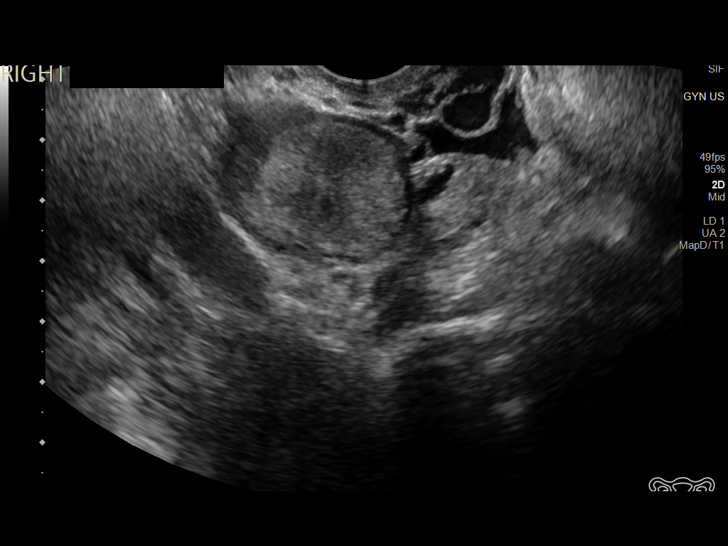
[im 60/90]
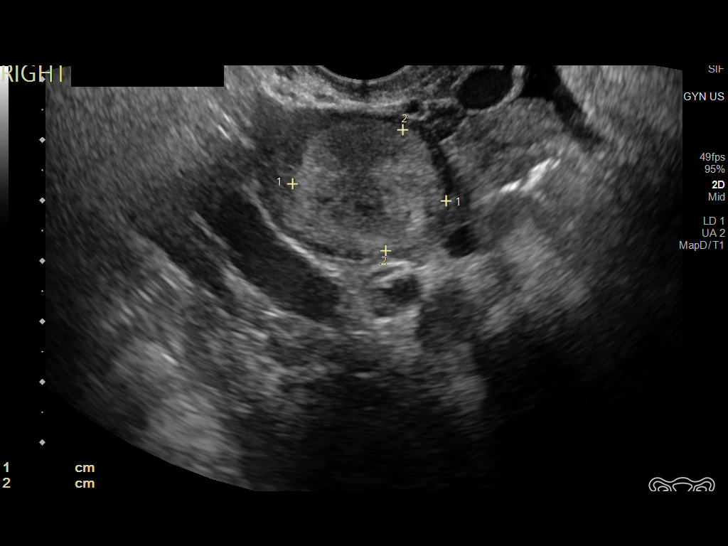
[im 67/90]
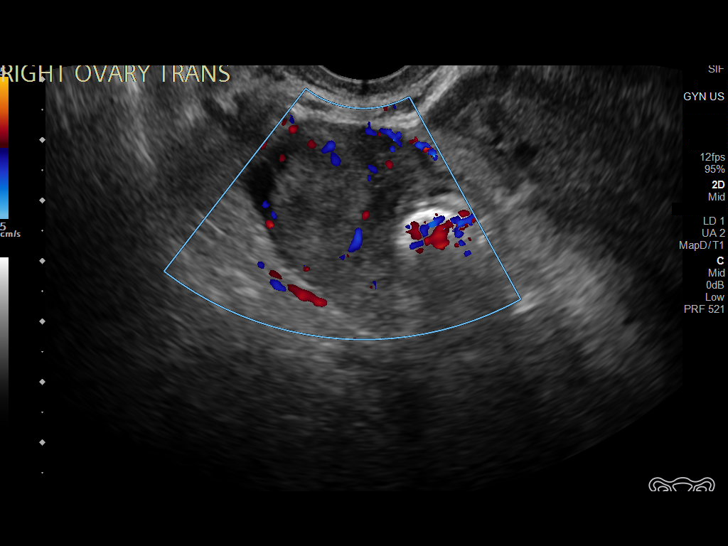
[im 75/90]
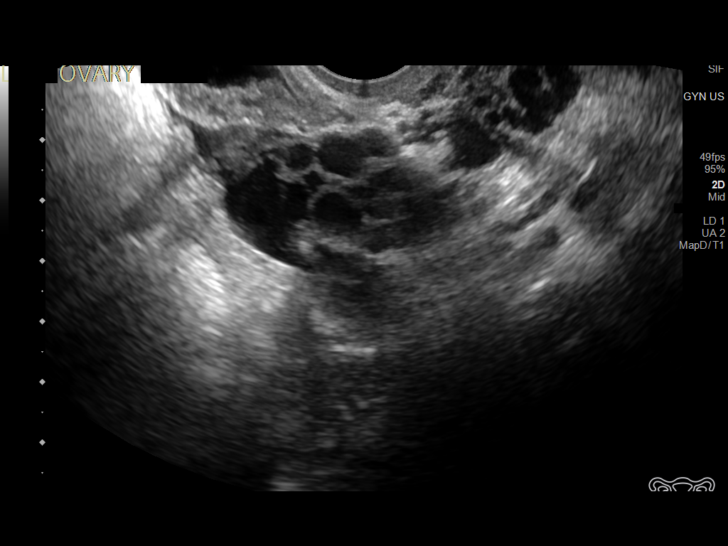
[im 82/90]
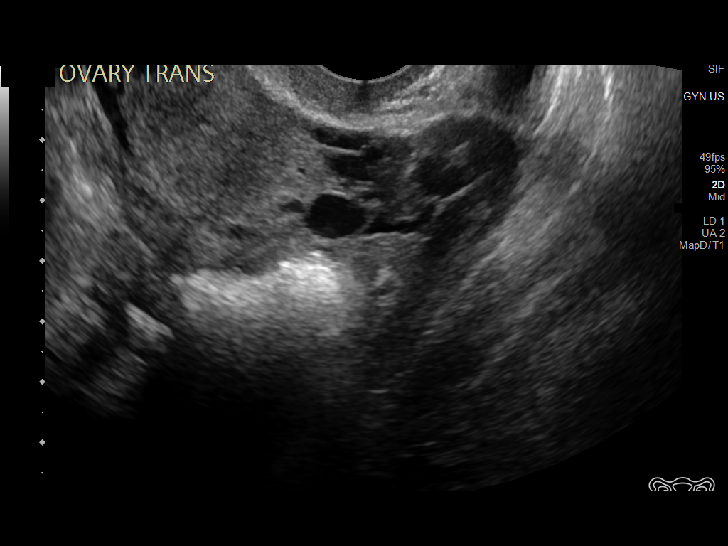
[im 90/90]
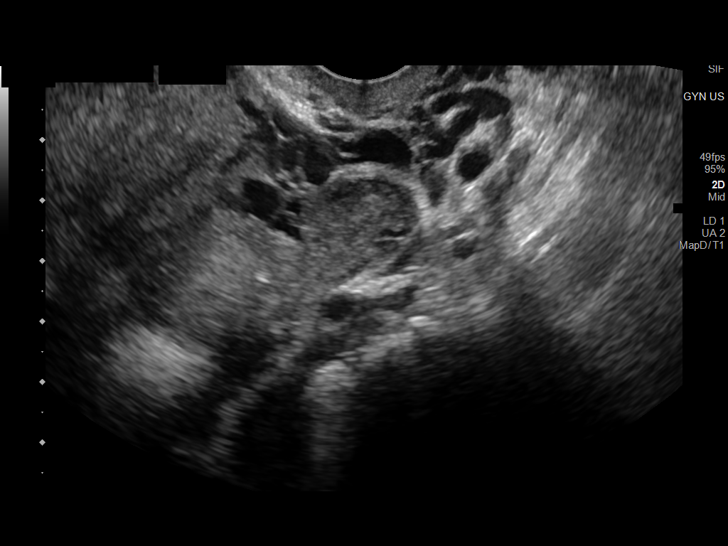

[13 of 25 positions shown; findings below may reference images not displayed]

FINDINGS: Uterus

Measurements: 8.2 x 4.5 x 4.8 cm = volume: 94 mL. Anteverted.
Heterogeneous myometrium. RIGHT fundal leiomyoma anteriorly,
subserosal, 2.3 x 2.0 x 1.9 cm. No additional masses.

Endometrium

Thickness: 2 mm.  No endometrial fluid or mass

Right ovary

Measurements: 3.3 x 2.5 x 2.5 cm = volume: 10.7 mL. Mass identified
within RIGHT ovary 2.6 x 2.0 x 1.6 cm. This represents a significant
interval change in appearance of the lesion seen previously, with
the previously seen large cystic component no longer identified.
Observed mass contains heterogeneous echogenicity, including area
set are somewhat hyperechoic. Some internal blood flow is seen at
the periphery of the lesion, less internally. This lesion is
incompletely characterized. This did not contain macroscopic fat on
the recent CT.

Left ovary

Measurements: 2.6 x 1.5 x 2.4 cm = volume: 4.8 mL. Normal morphology
without mass

Other findings

Trace free pelvic fluid.  No additional pelvic masses.
IMPRESSION: 2.3 cm diameter subserosal anterior RIGHT fundal uterine leiomyoma.

No evidence of LEFT ovarian mass.

2.6 cm diameter heterogeneous mass within RIGHT ovary, corresponding
to the lesion seen previously, with the previously seen large cystic
component no longer identified.

Differential diagnosis includes hemorrhagic cyst, dermoid tumor,
endometrioma.

Characterization of this lesion by MR imaging with and without
contrast recommended.

## 2021-11-19 NOTE — Telephone Encounter (Signed)
Chart review only.  No changes made at this time.

## 2021-11-21 ENCOUNTER — Telehealth (INDEPENDENT_AMBULATORY_CARE_PROVIDER_SITE_OTHER): Payer: Medicaid Other | Admitting: Obstetrics and Gynecology

## 2021-11-21 DIAGNOSIS — N83201 Unspecified ovarian cyst, right side: Secondary | ICD-10-CM | POA: Diagnosis not present

## 2021-11-21 NOTE — Progress Notes (Signed)
S/w pt for virtual visit to discuss u/s results.

## 2021-11-21 NOTE — Progress Notes (Signed)
GYNECOLOGY VIRTUAL VISIT ENCOUNTER NOTE  Provider location: [facility] [place] [facility]   Patient location: Home  I connected with Stephanie [name] on 11/21/21 at 10:55 AM EST by MyChart Video Encounter and verified that I am speaking with the correct person using two identifiers.   I discussed the limitations, risks, security and privacy concerns of performing an evaluation and management service virtually and the availability of in person appointments. I also discussed with the patient that there may be a patient responsible charge related to this service. The patient expressed understanding and agreed to proceed.   History:  Stephanie [name] is a 41 y.o. [id number] female being evaluated today for follow up of ovarian cysts. She denies any abnormal vaginal discharge, bleeding.   Pt still notes intermittent back and pelvic pain.     Past Medical History:  Diagnosis Date   Anxiety    Blood transfusion without reported diagnosis    Bowel obstruction (HCC)    BV (bacterial vaginosis)    Colon cancer [facility])    Colon Cancer 03-2020   Colon polyps    Colostomy present [facility])    03-2020   Family history of bladder cancer    History of chemotherapy    ended 09-2020   Lactose intolerance 03/12/2020   Neuromuscular disorder (HCC)    neuropathy feet hands legs   UTI (lower urinary tract infection)    Past Surgical History:  Procedure Laterality Date   CESAREAN SECTION     CHOLECYSTECTOMY  12/13/2020   COLECTOMY  03/2020   COLONOSCOPY     COLOSTOMY TAKEDOWN N/A 05/30/2021   Procedure: LAPAROSCOPIC ASSISTED COLOSTOMY TAKEDOWN;  Surgeon: [name], MD;  Location: [place];  Service: General;  Laterality: N/A;   CYSTOSCOPY WITH STENT PLACEMENT  04/04/2020   Procedure: CYSTOSCOPY WITH STENT PLACEMENT;  Surgeon: [name], MD;  Location: [facility];  Service: General;;   LAPAROSCOPIC LIVER ULTRASOUND N/A 12/13/2020   Procedure: INTRAOPERATIVE LIVER ULTRASOUND;  Surgeon:  [name], MD;  Location: [place];  Service: General;  Laterality: N/A;   LAPAROSCOPY N/A 12/13/2020   Procedure: LAPAROSCOPY DIAGNOSTIC;  Surgeon: [name], MD;  Location: [place];  Service: General;  Laterality: N/A;   LAPAROTOMY N/A 04/04/2020   Procedure: EXPLORATORY LAPAROTOMY;  Surgeon: [name], MD;  Location: [facility];  Service: General;  Laterality: N/A;   OPEN PARTIAL HEPATECTOMY  N/A 12/13/2020   Procedure: OPEN PARTIAL HEPATECTOMY;  Surgeon: [name], MD;  Location: [place];  Service: General;  Laterality: N/A;  ROOM 2 STARTING AT 09:30AM FOR 300 MIN   PORTACATH PLACEMENT Right 04/12/2020   Procedure: INSERTION PORT-A-CATH WITH ULTRASOUND;  Surgeon: Kieth Brightly Arta Bruce, MD;  Location: [facility];  Service: General;  Laterality: Right;   The following portions of the patient's history were reviewed and updated as appropriate: allergies, current medications, past family history, past medical history, past social history, past surgical history and problem list.   Health Maintenance:  Normal pap and negative HRHPV on 02/05/21.  Normal mammogram on 03/11/21.   Review of Systems:  Pertinent items noted in HPI and remainder of comprehensive ROS otherwise negative.  Physical Exam:   General:  Alert, oriented and cooperative. Patient appears to be in no acute distress.  Mental Status: Normal mood and affect. Normal behavior. Normal judgment and thought content.   Respiratory: Normal respiratory effort, no problems with respiration noted  Rest of physical exam deferred due to type of encounter  Labs  and Imaging Results for orders placed or performed in visit on 11/12/21 (from the past 336 hour(s))  CEA (IN HOUSE-[facility])FOR [facility] ONLY   Collection Time: 11/12/21 11:53 AM  Result Value Ref Range   CEA ([facility]-In House) 2.66 0.00 - 5.00 ng/mL  CMP (Cancer Center only)   Collection Time: 11/12/21 11:53 AM  Result Value Ref Range   Sodium 136 135 - 145 mmol/L   Potassium 4.1  3.5 - 5.1 mmol/L   Chloride 104 98 - 111 mmol/L   CO2 26 22 - 32 mmol/L   Glucose, Bld 94 70 - 99 mg/dL   BUN 9 6 - 20 mg/dL   Creatinine 0.65 0.44 - 1.00 mg/dL   Calcium 8.7 (L) 8.9 - 10.3 mg/dL   Total Protein 6.7 6.5 - 8.1 g/dL   Albumin 3.9 3.5 - 5.0 g/dL   AST 20 15 - 41 U/L   ALT 21 0 - 44 U/L   Alkaline Phosphatase 41 38 - 126 U/L   Total Bilirubin 0.6 0.3 - 1.2 mg/dL   GFR, Estimated >60 >60 mL/min   Anion gap 6 5 - 15  CBC with Differential (Cancer Center Only)   Collection Time: 11/12/21 11:53 AM  Result Value Ref Range   WBC Count 6.9 4.0 - 10.5 K/uL   RBC 4.48 3.87 - 5.11 MIL/uL   Hemoglobin 14.5 12.0 - 15.0 g/dL   HCT 41.7 36.0 - 46.0 %   MCV 93.1 80.0 - 100.0 fL   MCH 32.4 26.0 - 34.0 pg   MCHC 34.8 30.0 - 36.0 g/dL   RDW 12.4 11.5 - 15.5 %   Platelet Count 114 (L) 150 - 400 K/uL   nRBC 0.0 0.0 - 0.2 %   Neutrophils Relative % 76 %   Neutro Abs 5.1 1.7 - 7.7 K/uL   Lymphocytes Relative 17 %   Lymphs Abs 1.2 0.7 - 4.0 K/uL   Monocytes Relative 6 %   Monocytes Absolute 0.4 0.1 - 1.0 K/uL   Eosinophils Relative 1 %   Eosinophils Absolute 0.1 0.0 - 0.5 K/uL   Basophils Relative 0 %   Basophils Absolute 0.0 0.0 - 0.1 K/uL   Immature Granulocytes 0 %   Abs Immature Granulocytes 0.02 0.00 - 0.07 K/uL   CT Chest Wo Contrast  Result Date: 11/13/2021 CLINICAL DATA:  Metastatic colorectal cancer, rule out lung metastasis EXAM: CT CHEST WITHOUT CONTRAST TECHNIQUE: Multidetector CT imaging of the chest was performed following the standard protocol without IV contrast. RADIATION DOSE REDUCTION: This exam was performed according to the departmental dose-optimization program which includes automated exposure control, adjustment of the mA and/or kV according to patient size and/or use of iterative reconstruction technique. COMPARISON:  CT abdomen pelvis, 10/05/2021, CT chest, 07/11/2021 FINDINGS: Cardiovascular: Right chest port catheter. Normal heart size. No pericardial  effusion. Mediastinum/Nodes: No enlarged mediastinal, hilar, or axillary lymph nodes. Thymic remnant in the anterior mediastinum. Thyroid gland, trachea, and esophagus demonstrate no significant findings. Lungs/Pleura: Lungs are clear. No pleural effusion or pneumothorax. Upper Abdomen: No acute abnormality. Surgical clips about the posterior right lobe of the liver (series 2, image 142). Musculoskeletal: No chest wall abnormality. No suspicious osseous lesions identified. IMPRESSION: 1. No evidence of pulmonary metastatic disease. 2. Stable postoperative findings about the posterior right lobe of the liver. Electronically Signed   By: Delanna Ahmadi M.D.   On: 11/13/2021 12:58   US PELVIC COMPLETE WITH TRANSVAGINAL  Result Date: 11/19/2021 CLINICAL DATA:  Ovarian cysts bilaterally,  follow-up EXAM: TRANSABDOMINAL AND TRANSVAGINAL ULTRASOUND OF PELVIS TECHNIQUE: Both transabdominal and transvaginal ultrasound examinations of the pelvis were performed. Transabdominal technique was performed for global imaging of the pelvis including uterus, ovaries, adnexal regions, and pelvic cul-de-sac. It was necessary to proceed with endovaginal exam following the transabdominal exam to visualize the endometrium and ovaries. COMPARISON:  10/04/2021, 10/05/2021 ultrasound exam; CT abdomen and pelvis 10/05/2021 FINDINGS: Uterus Measurements: 8.2 x 4.5 x 4.8 cm = volume: 94 mL. Anteverted. Heterogeneous myometrium. RIGHT fundal leiomyoma anteriorly, subserosal, 2.3 x 2.0 x 1.9 cm. No additional masses. Endometrium Thickness: 2 mm.  No endometrial fluid or mass Right ovary Measurements: 3.3 x 2.5 x 2.5 cm = volume: 10.7 mL. Mass identified within RIGHT ovary 2.6 x 2.0 x 1.6 cm. This represents a significant interval change in appearance of the lesion seen previously, with the previously seen large cystic component no longer identified. Observed mass contains heterogeneous echogenicity, including area set are somewhat hyperechoic.  Some internal blood flow is seen at the periphery of the lesion, less internally. This lesion is incompletely characterized. This did not contain macroscopic fat on the recent CT. Left ovary Measurements: 2.6 x 1.5 x 2.4 cm = volume: 4.8 mL. Normal morphology without mass Other findings Trace free pelvic fluid.  No additional pelvic masses. IMPRESSION: 2.3 cm diameter subserosal anterior RIGHT fundal uterine leiomyoma. No evidence of LEFT ovarian mass. 2.6 cm diameter heterogeneous mass within RIGHT ovary, corresponding to the lesion seen previously, with the previously seen large cystic component no longer identified. Differential diagnosis includes hemorrhagic cyst, dermoid tumor, endometrioma. Characterization of this lesion by MR imaging with and without contrast recommended. Electronically Signed   By: Lavonia Dana M.D.   On: 11/19/2021 17:26       Assessment and Plan:     1. Cyst of right ovary Reviewed current ultrasound with the patient.  Left ovarian cyst has completely resolved.  Right ovarian cyst had significant decrease in size.  Pt still concerned due to history of colon cancer, and is worried about metastasis.    Pt reassured that decrease in size and lack of ascites is a good finding.  Will still draw a ROMA test as well as repeat ultrasound in two months to continue to monitor the cyst.  Pt agreess with the plan. - Ovarian Malignancy Risk-ROMA; Future - US PELVIC COMPLETE WITH TRANSVAGINAL; Future       I discussed the assessment and treatment plan with the patient. The patient was provided an opportunity to ask questions and all were answered. The patient agreed with the plan and demonstrated an understanding of the instructions.   The patient was advised to call back or seek an in-person evaluation/go to the ED if the symptoms worsen or if the condition fails to improve as anticipated.  I provided 10 minutes of face-to-face time during this encounter.   Griffin Basil,  MD [facility], [place]

## 2021-12-04 ENCOUNTER — Telehealth: Payer: Self-pay

## 2021-12-04 NOTE — Telephone Encounter (Signed)
Pt called stating that she lost her BC rx and needs new rx, routed to provider for review. ?

## 2021-12-05 ENCOUNTER — Telehealth (INDEPENDENT_AMBULATORY_CARE_PROVIDER_SITE_OTHER): Payer: Medicaid Other | Admitting: Obstetrics and Gynecology

## 2021-12-05 DIAGNOSIS — N83201 Unspecified ovarian cyst, right side: Secondary | ICD-10-CM

## 2021-12-05 NOTE — Progress Notes (Signed)
I connected with  Stephanie [name] on 12/05/21 by a video enabled telemedicine application and verified that I am speaking with the correct person using two identifiers. ?  ?I discussed the limitations of evaluation and management by telemedicine. The patient expressed understanding and agreed to proceed.  ? ?Mychart FU.  Patient stated that she lost her BC pills and wants new ones, she has 6 refills at the Pharmacy. ?

## 2021-12-05 NOTE — Progress Notes (Signed)
Pt called with no answer.  She should have follow up in 2 months to go over ultrasound findings.  Pt still has not undergone ROMA testing. ? ? ?Lynnda Shields, MD ?Faculty attending ?[facility]. ?

## 2021-12-06 ENCOUNTER — Telehealth: Payer: Self-pay

## 2021-12-06 NOTE — Telephone Encounter (Signed)
Contacted pt to follow up about BC pills. S/w pharmacy and cost for refill is $17 or patient can call insurance to try and have them override it. Pt did not answer, unable to leave pt a vm. ?

## 2021-12-11 ENCOUNTER — Telehealth: Payer: Self-pay

## 2021-12-11 NOTE — Telephone Encounter (Signed)
S/w patient and advised that she can come in for lab visit for ROMA test. Orders are in. ?

## 2021-12-13 ENCOUNTER — Other Ambulatory Visit: Payer: Medicaid Other

## 2021-12-13 ENCOUNTER — Other Ambulatory Visit: Payer: Self-pay

## 2021-12-13 DIAGNOSIS — C19 Malignant neoplasm of rectosigmoid junction: Secondary | ICD-10-CM

## 2021-12-14 LAB — PREMENOPAUSAL INTERP: LOW

## 2021-12-14 LAB — POSTMENOPAUSAL INTERP: LOW

## 2021-12-14 LAB — OVARIAN MALIGNANCY RISK-ROMA
Cancer Antigen (CA) 125: 7 U/mL (ref 0.0–38.1)
HE4: 44.6 pmol/L (ref 0.0–63.6)
Postmenopausal ROMA: 0.62
Premenopausal ROMA: 0.55

## 2022-01-13 ENCOUNTER — Other Ambulatory Visit: Payer: Self-pay

## 2022-01-13 ENCOUNTER — Encounter: Payer: Self-pay | Admitting: Hematology

## 2022-01-13 ENCOUNTER — Inpatient Hospital Stay: Payer: Medicaid Other | Attending: Nurse Practitioner

## 2022-01-13 DIAGNOSIS — Z95828 Presence of other vascular implants and grafts: Secondary | ICD-10-CM

## 2022-01-13 DIAGNOSIS — C787 Secondary malignant neoplasm of liver and intrahepatic bile duct: Secondary | ICD-10-CM | POA: Diagnosis not present

## 2022-01-13 DIAGNOSIS — C19 Malignant neoplasm of rectosigmoid junction: Secondary | ICD-10-CM | POA: Insufficient documentation

## 2022-01-13 LAB — CBC WITH DIFFERENTIAL (CANCER CENTER ONLY)
Abs Immature Granulocytes: 0 10*3/uL (ref 0.00–0.07)
Basophils Absolute: 0 10*3/uL (ref 0.0–0.1)
Basophils Relative: 0 %
Eosinophils Absolute: 0.1 10*3/uL (ref 0.0–0.5)
Eosinophils Relative: 1 %
HCT: 41.6 % (ref 36.0–46.0)
Hemoglobin: 14.5 g/dL (ref 12.0–15.0)
Immature Granulocytes: 0 %
Lymphocytes Relative: 23 %
Lymphs Abs: 1 10*3/uL (ref 0.7–4.0)
MCH: 32.2 pg (ref 26.0–34.0)
MCHC: 34.9 g/dL (ref 30.0–36.0)
MCV: 92.2 fL (ref 80.0–100.0)
Monocytes Absolute: 0.4 10*3/uL (ref 0.1–1.0)
Monocytes Relative: 10 %
Neutro Abs: 2.9 10*3/uL (ref 1.7–7.7)
Neutrophils Relative %: 66 %
Platelet Count: 105 10*3/uL — ABNORMAL LOW (ref 150–400)
RBC: 4.51 MIL/uL (ref 3.87–5.11)
RDW: 12.3 % (ref 11.5–15.5)
WBC Count: 4.4 10*3/uL (ref 4.0–10.5)
nRBC: 0 % (ref 0.0–0.2)

## 2022-01-13 LAB — CMP (CANCER CENTER ONLY)
ALT: 16 U/L (ref 0–44)
AST: 16 U/L (ref 15–41)
Albumin: 3.8 g/dL (ref 3.5–5.0)
Alkaline Phosphatase: 38 U/L (ref 38–126)
Anion gap: 4 — ABNORMAL LOW (ref 5–15)
BUN: 12 mg/dL (ref 6–20)
CO2: 27 mmol/L (ref 22–32)
Calcium: 8.7 mg/dL — ABNORMAL LOW (ref 8.9–10.3)
Chloride: 107 mmol/L (ref 98–111)
Creatinine: 0.75 mg/dL (ref 0.44–1.00)
GFR, Estimated: >60 mL/min (ref >60)
Glucose, Bld: 102 mg/dL — ABNORMAL HIGH (ref 70–99)
Potassium: 3.6 mmol/L (ref 3.5–5.1)
Sodium: 138 mmol/L (ref 135–145)
Total Bilirubin: 0.3 mg/dL (ref 0.3–1.2)
Total Protein: 6.6 g/dL (ref 6.5–8.1)

## 2022-01-13 LAB — CEA (IN HOUSE-CHCC): CEA (CHCC-In House): 2.55 ng/mL (ref 0.00–5.00)

## 2022-01-13 MED ORDER — HEPARIN SOD (PORK) LOCK FLUSH 100 UNIT/ML IV SOLN
500.0000 [IU] | Freq: Once | INTRAVENOUS | Status: AC
  Administered 2022-01-13: 500 [IU]

## 2022-01-13 MED ORDER — SODIUM CHLORIDE 0.9% FLUSH
10.0000 mL | Freq: Once | INTRAVENOUS | Status: AC
  Administered 2022-01-13: 10 mL

## 2022-01-20 ENCOUNTER — Other Ambulatory Visit: Payer: Self-pay | Admitting: Obstetrics and Gynecology

## 2022-01-20 ENCOUNTER — Ambulatory Visit
Admission: RE | Admit: 2022-01-20 | Discharge: 2022-01-20 | Disposition: A | Discharge status: Home / Self Care | Payer: Medicaid Other | Source: Ambulatory Visit | Attending: Obstetrics and Gynecology | Admitting: Obstetrics and Gynecology

## 2022-01-20 DIAGNOSIS — N83201 Unspecified ovarian cyst, right side: Secondary | ICD-10-CM | POA: Insufficient documentation

## 2022-01-20 IMAGING — US US TRANSVAGINAL NON-OB
1 series · 15 of 25 positions shown · non-contrast
Comparison: Ultrasound [DATE].

CLINICAL DATA: Follow-up right ovarian cyst. History of metastatic
colon cancer.

EXAM:
TRANSABDOMINAL AND TRANSVAGINAL ULTRASOUND OF PELVIS
TECHNIQUE: Both transabdominal and transvaginal ultrasound examinations of the
pelvis were performed. Transabdominal technique was performed for
global imaging of the pelvis including uterus, ovaries, adnexal
regions, and pelvic cul-de-sac. It was necessary to proceed with
endovaginal exam following the transabdominal exam to visualize the
2.3 cm fibroid again noted.

[Series 1: us transvaginal non-ob · 15 of 94 slices shown]
[im 1/94]
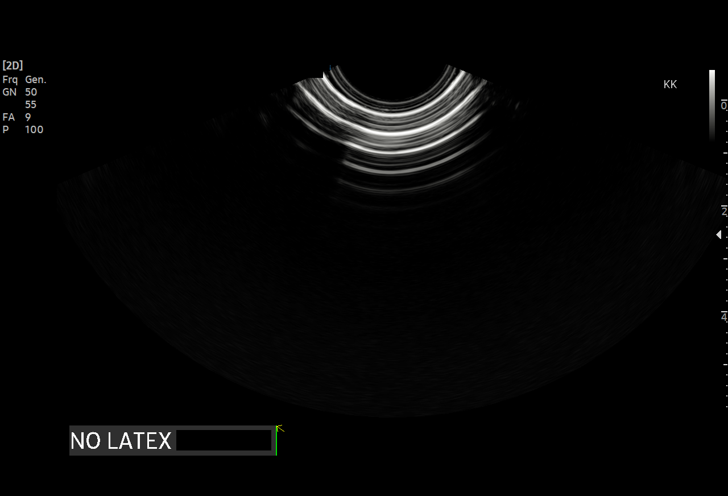
[im 8/94]
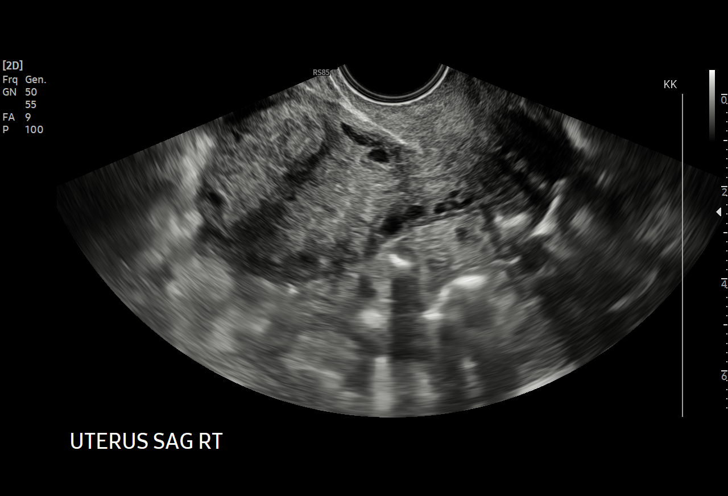
[im 16/94]
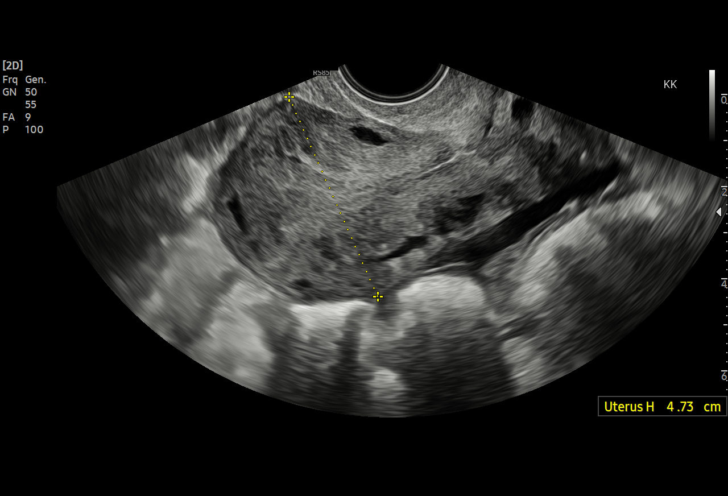
[im 20/94]
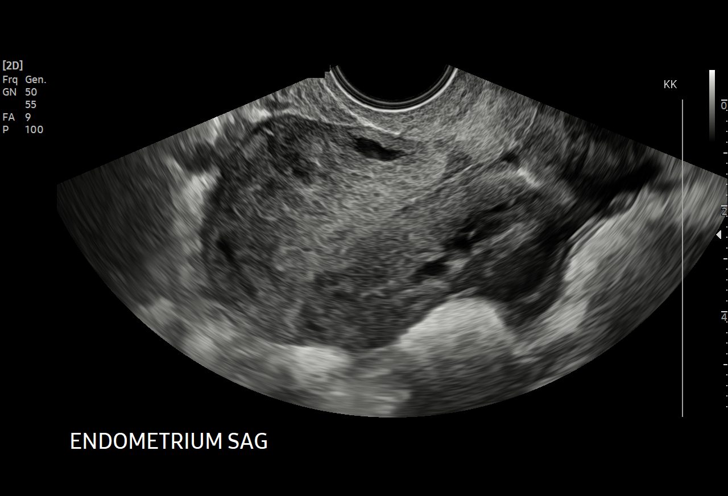
[im 28/94]
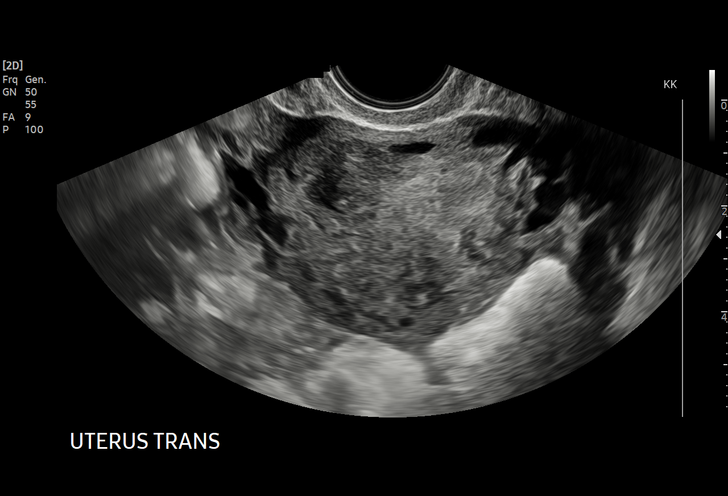
[im 35/94]
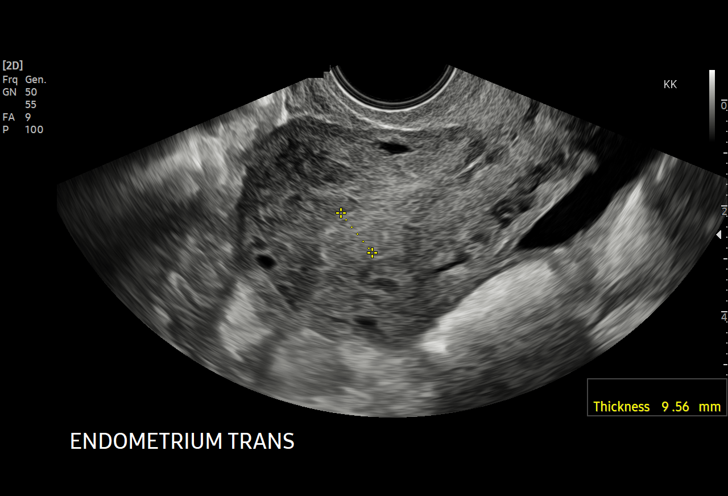
[im 39/94]
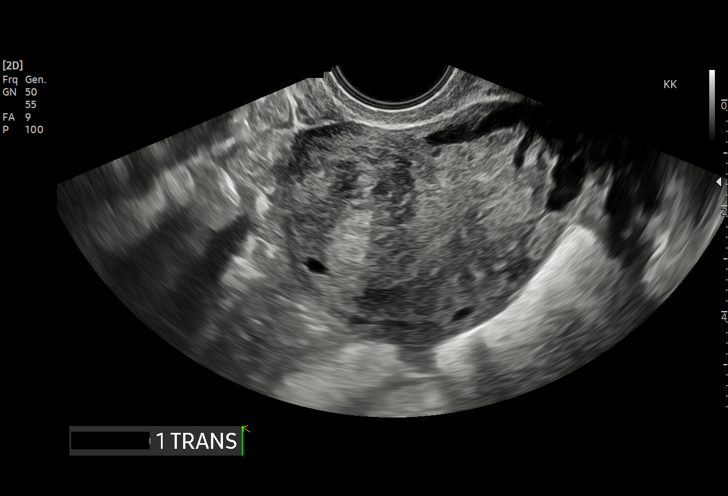
[im 47/94]
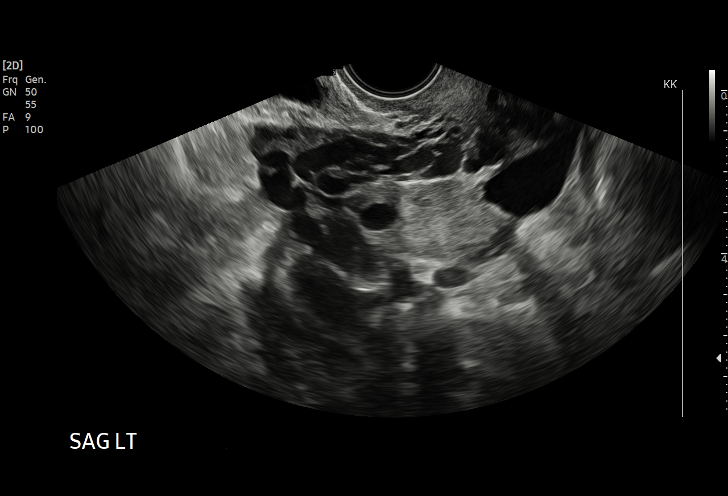
[im 55/94]
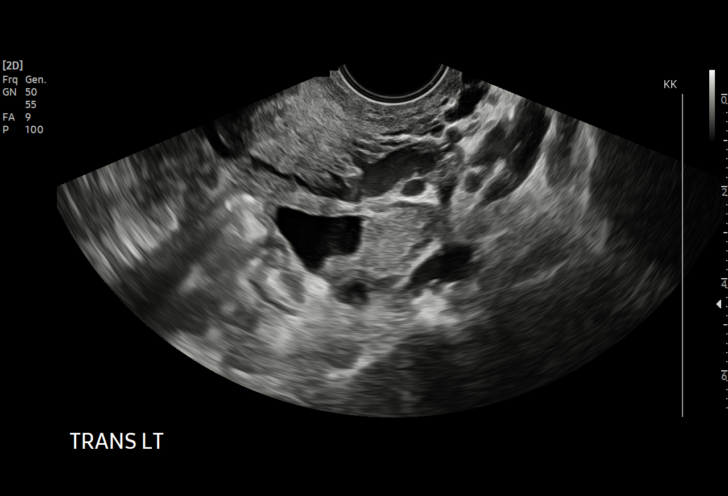
[im 59/94]
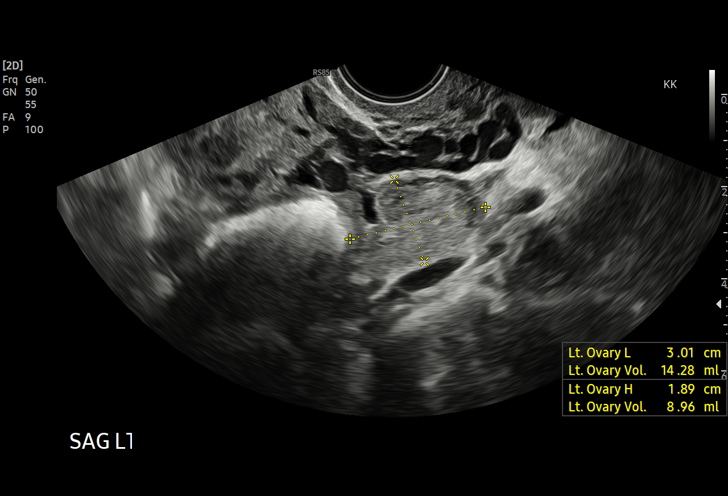
[im 66/94]
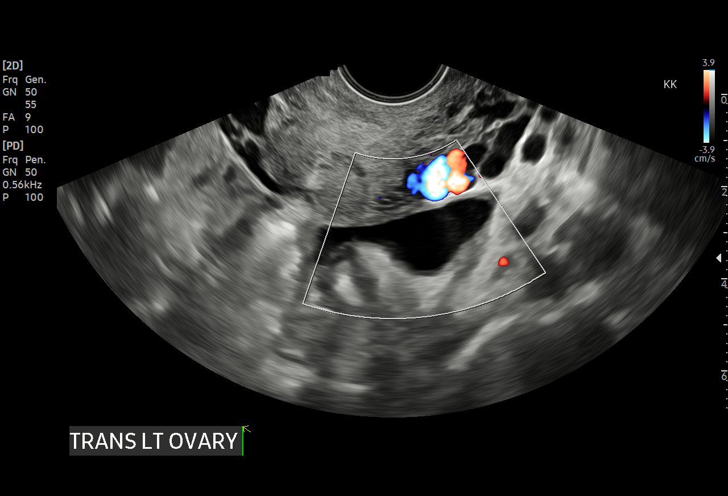
[im 74/94]
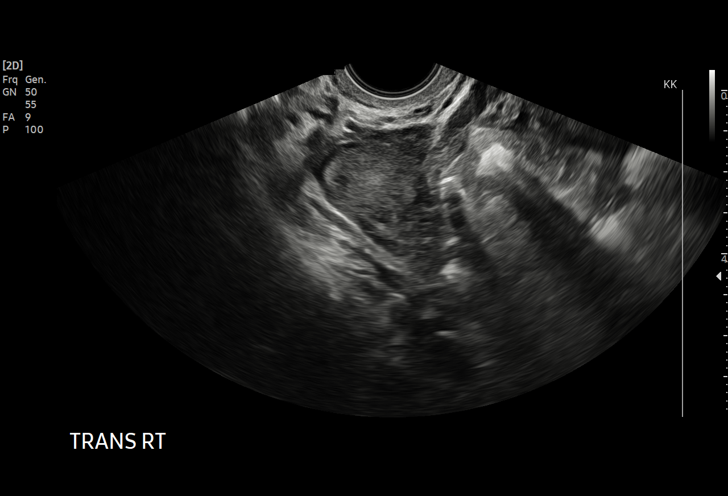
[im 78/94]
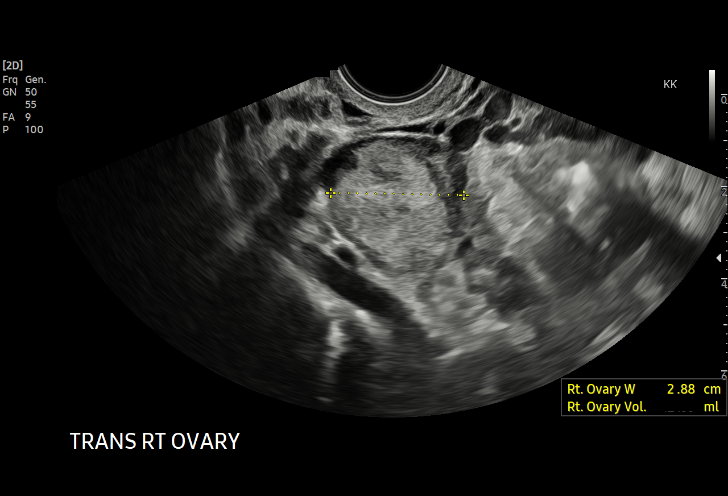
[im 86/94]
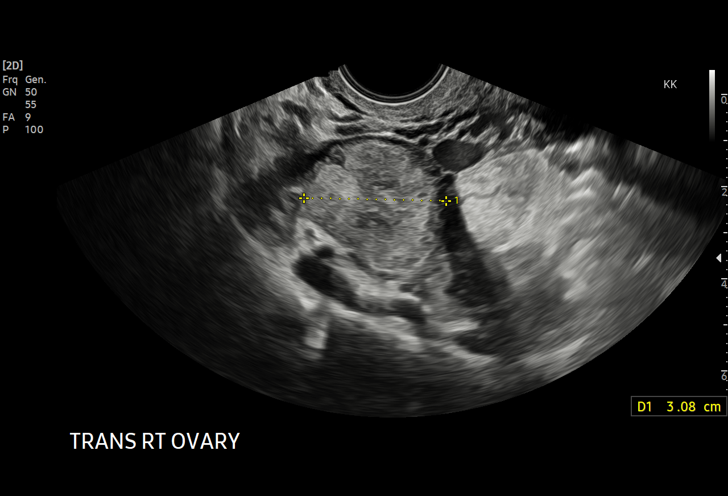
[im 94/94]
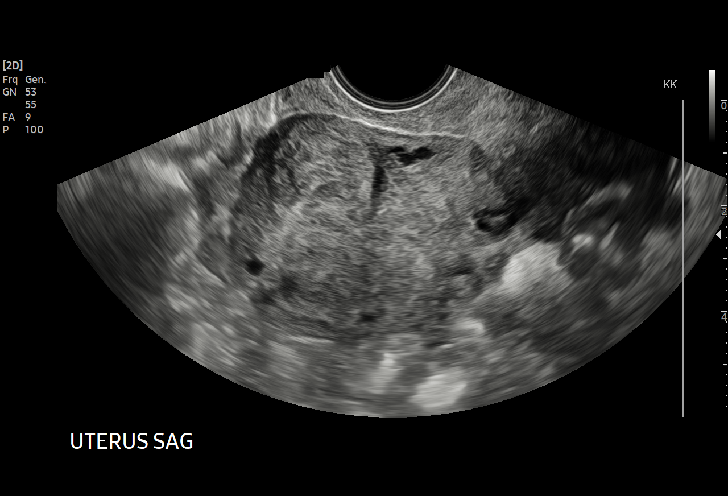

[15 of 25 positions shown; findings below may reference images not displayed]

FINDINGS: Uterus

Measurements: 8.1 x 4.7 x 5.4 cm = volume: 107.5 mL. 2.3 cm uterine
fibroid again noted.

Endometrium

Thickness: 9.6 mm.  No focal abnormality visualized.

Right ovary

Measurements: 3.5 x 3.2 x 2.9 cm = volume: 17.2 mL. 3.2 x 2.8 x
cm solid heterogeneous mass is again noted. Some degree of
peripheral blood flow noted on Doppler exam. Slight interval growth
from prior exam. Again although this may represent a process such as
a dermoid or endometrioma other etiologies including ovarian
malignancy or metastatic disease (given the patient's history of
metastatic colon cancer), cannot be excluded.

Left ovary

Measurements: 3.0 x 1.4 x 1.6 cm = volume: 4.9 mL. 1.9 cm simple
cyst.

Other findings

Small amount of free pelvic fluid.
IMPRESSION: 1. 3.2 x 2.8 x 3.1 solid heterogeneous mass is again noted in the
right ovary. Some degree of peripheral blood flow noted. Slight
interval growth from prior exam. Again although this may represent a
process such as a dermoid or endometrioma, other etiologies
including ovarian malignancy or metastatic disease (given patient's
history of metastatic colon cancer), cannot be excluded. Again
pelvic MRI may prove useful for further evaluation.

2.  1.9 cm simple left ovarian cyst.

3. 2.3 cm uterine fibroid again noted. Small amount of free pelvic
fluid.

## 2022-01-22 ENCOUNTER — Telehealth: Payer: Self-pay

## 2022-01-22 ENCOUNTER — Emergency Department (HOSPITAL_COMMUNITY): Payer: Medicaid Other

## 2022-01-22 ENCOUNTER — Other Ambulatory Visit: Payer: Self-pay

## 2022-01-22 ENCOUNTER — Emergency Department (HOSPITAL_COMMUNITY)
Admission: EM | Admit: 2022-01-22 | Discharge: 2022-01-22 | Disposition: A | Discharge status: Home / Self Care | Payer: Medicaid Other | Attending: Emergency Medicine | Admitting: Emergency Medicine

## 2022-01-22 ENCOUNTER — Encounter (HOSPITAL_COMMUNITY): Payer: Self-pay | Admitting: Oncology

## 2022-01-22 ENCOUNTER — Encounter: Payer: Self-pay | Admitting: Obstetrics and Gynecology

## 2022-01-22 DIAGNOSIS — N390 Urinary tract infection, site not specified: Secondary | ICD-10-CM | POA: Diagnosis not present

## 2022-01-22 DIAGNOSIS — M546 Pain in thoracic spine: Secondary | ICD-10-CM | POA: Diagnosis present

## 2022-01-22 DIAGNOSIS — K429 Umbilical hernia without obstruction or gangrene: Secondary | ICD-10-CM | POA: Insufficient documentation

## 2022-01-22 DIAGNOSIS — M549 Dorsalgia, unspecified: Secondary | ICD-10-CM

## 2022-01-22 DIAGNOSIS — N83201 Unspecified ovarian cyst, right side: Secondary | ICD-10-CM | POA: Insufficient documentation

## 2022-01-22 DIAGNOSIS — D696 Thrombocytopenia, unspecified: Secondary | ICD-10-CM | POA: Diagnosis not present

## 2022-01-22 DIAGNOSIS — N83202 Unspecified ovarian cyst, left side: Secondary | ICD-10-CM | POA: Diagnosis not present

## 2022-01-22 DIAGNOSIS — I3139 Other pericardial effusion (noninflammatory): Secondary | ICD-10-CM

## 2022-01-22 LAB — CBC WITH DIFFERENTIAL/PLATELET
Abs Immature Granulocytes: 0.06 10*3/uL (ref 0.00–0.07)
Basophils Absolute: 0 10*3/uL (ref 0.0–0.1)
Basophils Relative: 1 %
Eosinophils Absolute: 0.1 10*3/uL (ref 0.0–0.5)
Eosinophils Relative: 1 %
HCT: 40.7 % (ref 36.0–46.0)
Hemoglobin: 13.6 g/dL (ref 12.0–15.0)
Immature Granulocytes: 1 %
Lymphocytes Relative: 26 %
Lymphs Abs: 1.4 10*3/uL (ref 0.7–4.0)
MCH: 31.9 pg (ref 26.0–34.0)
MCHC: 33.4 g/dL (ref 30.0–36.0)
MCV: 95.3 fL (ref 80.0–100.0)
Monocytes Absolute: 0.4 10*3/uL (ref 0.1–1.0)
Monocytes Relative: 8 %
Neutro Abs: 3.4 10*3/uL (ref 1.7–7.7)
Neutrophils Relative %: 63 %
Platelets: 127 10*3/uL — ABNORMAL LOW (ref 150–400)
RBC: 4.27 MIL/uL (ref 3.87–5.11)
RDW: 12.5 % (ref 11.5–15.5)
WBC: 5.4 10*3/uL (ref 4.0–10.5)
nRBC: 0 % (ref 0.0–0.2)

## 2022-01-22 LAB — URINALYSIS, ROUTINE W REFLEX MICROSCOPIC
Bilirubin Urine: NEGATIVE
Glucose, UA: NEGATIVE mg/dL
Hgb urine dipstick: NEGATIVE
Ketones, ur: NEGATIVE mg/dL
Nitrite: NEGATIVE
Protein, ur: NEGATIVE mg/dL
Specific Gravity, Urine: 1.013 (ref 1.005–1.030)
pH: 6 (ref 5.0–8.0)

## 2022-01-22 LAB — COMPREHENSIVE METABOLIC PANEL
ALT: 16 U/L (ref 0–44)
AST: 18 U/L (ref 15–41)
Albumin: 3.5 g/dL (ref 3.5–5.0)
Alkaline Phosphatase: 36 U/L — ABNORMAL LOW (ref 38–126)
Anion gap: 5 (ref 5–15)
BUN: 12 mg/dL (ref 6–20)
CO2: 26 mmol/L (ref 22–32)
Calcium: 8.6 mg/dL — ABNORMAL LOW (ref 8.9–10.3)
Chloride: 109 mmol/L (ref 98–111)
Creatinine, Ser: 0.66 mg/dL (ref 0.44–1.00)
GFR, Estimated: >60 mL/min (ref >60)
Glucose, Bld: 98 mg/dL (ref 70–99)
Potassium: 3.9 mmol/L (ref 3.5–5.1)
Sodium: 140 mmol/L (ref 135–145)
Total Bilirubin: 0.6 mg/dL (ref 0.3–1.2)
Total Protein: 6.2 g/dL — ABNORMAL LOW (ref 6.5–8.1)

## 2022-01-22 LAB — LIPASE, BLOOD: Lipase: 45 U/L (ref 11–51)

## 2022-01-22 IMAGING — CT CT ABD-PELV W/ CM
2 of 5 series · 15 of 46 positions shown, 17 images · IV contrast (agent unspecified)
Comparison: [DATE]

CLINICAL DATA: Abdominal pain, back pain

EXAM:
CT ABDOMEN AND PELVIS WITH CONTRAST
TECHNIQUE: Multidetector CT imaging of the abdomen and pelvis was performed
using the standard protocol following bolus administration of
intravenous contrast.

[Series 2: axial st · axial · 0.97mm/px · z∈[-531,-131]mm · 12 of 94 slices shown, 14 images]
[im 7/94  soft-tissue]
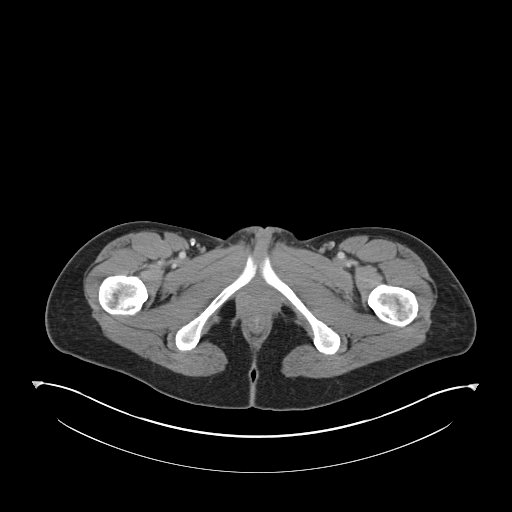
[im 7/94  bone]
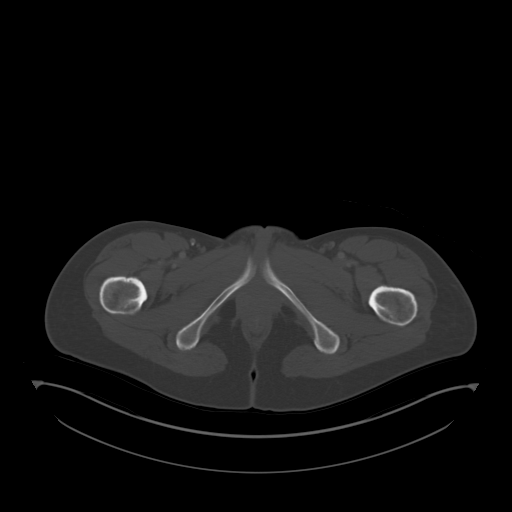
[im 13/94  soft-tissue]
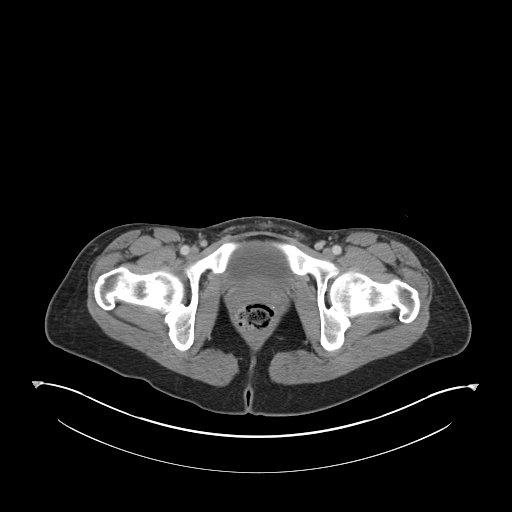
[im 19/94  soft-tissue]
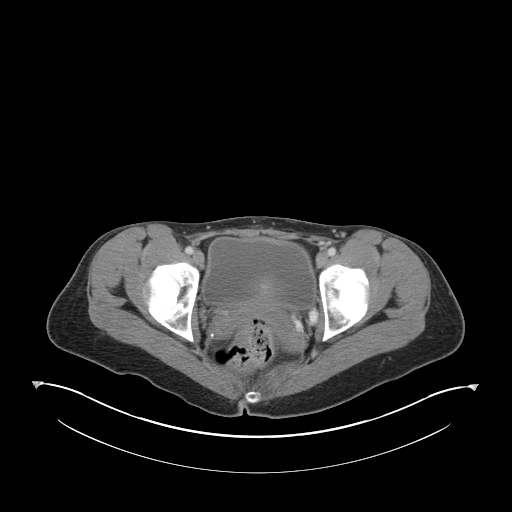
[im 32/94  soft-tissue]
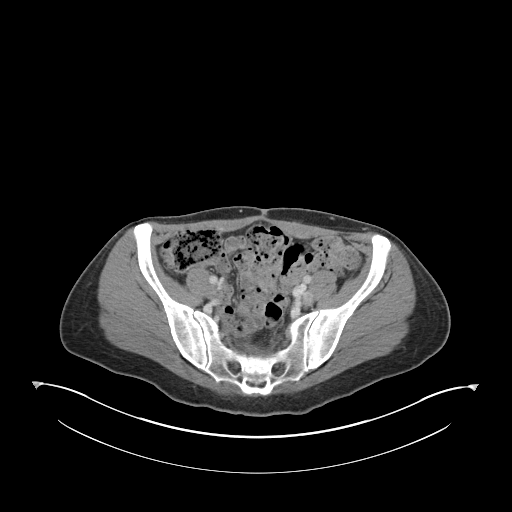
[im 38/94  soft-tissue]
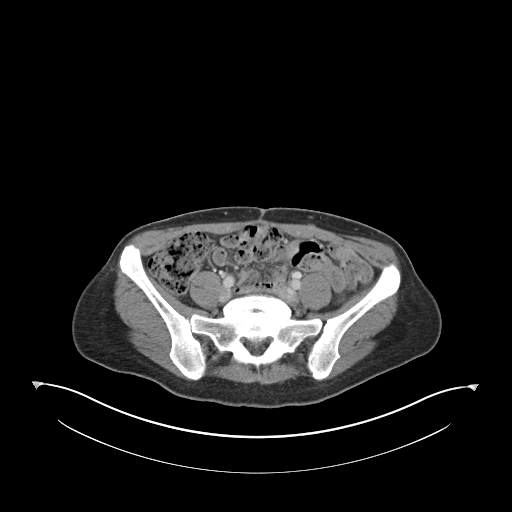
[im 44/94  soft-tissue]
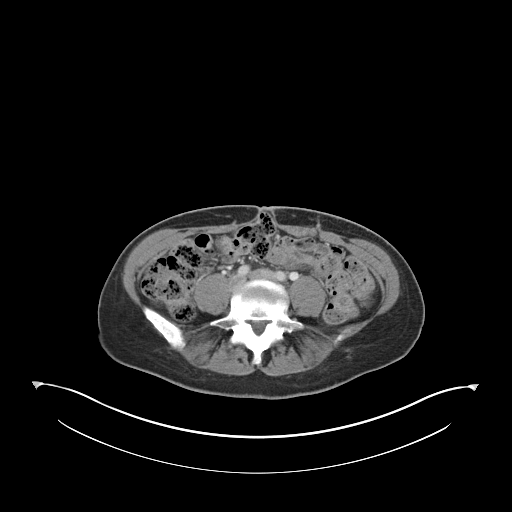
[im 50/94  soft-tissue]
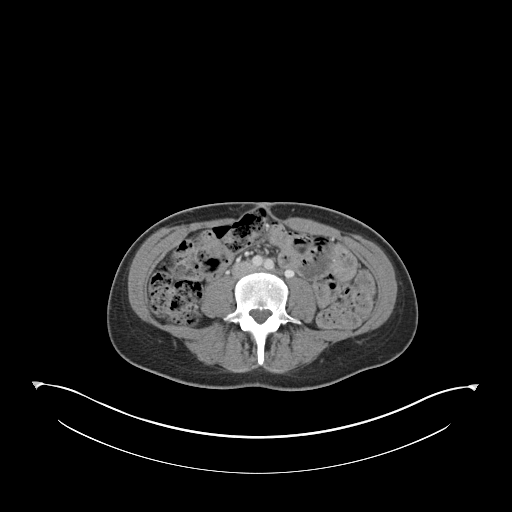
[im 56/94  soft-tissue]
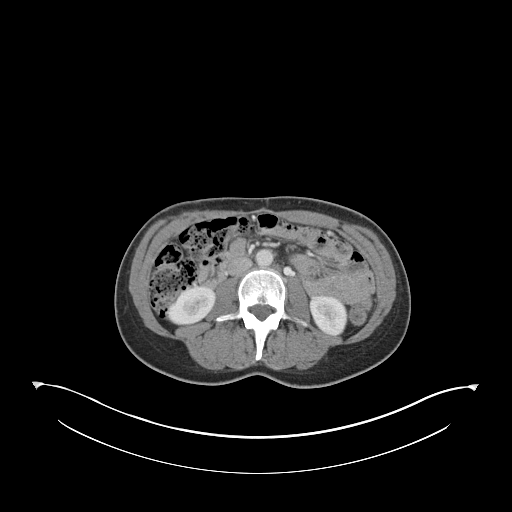
[im 63/94  soft-tissue]
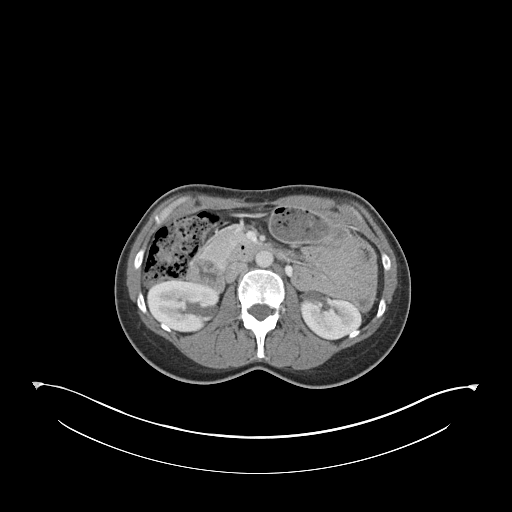
[im 63/94  bone]
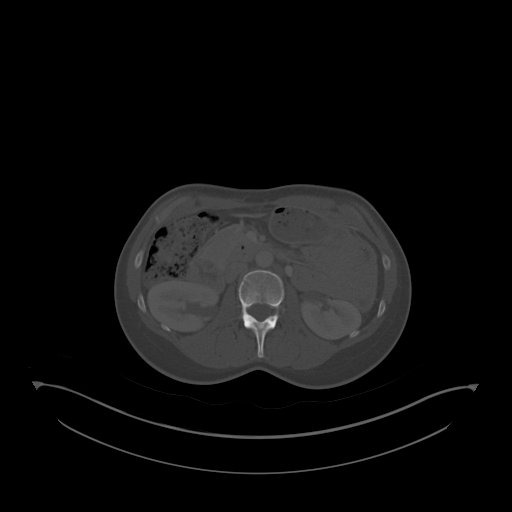
[im 75/94  soft-tissue]
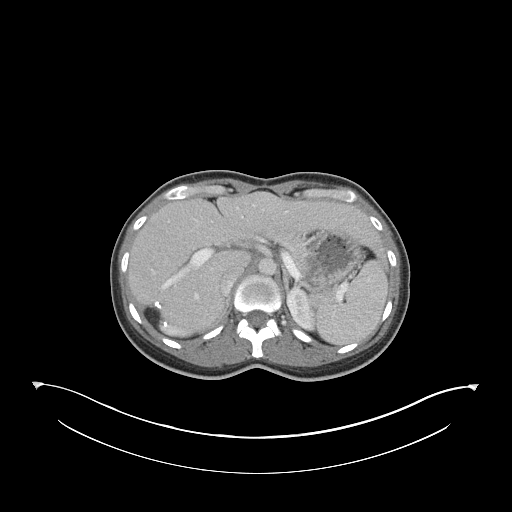
[im 81/94  soft-tissue]
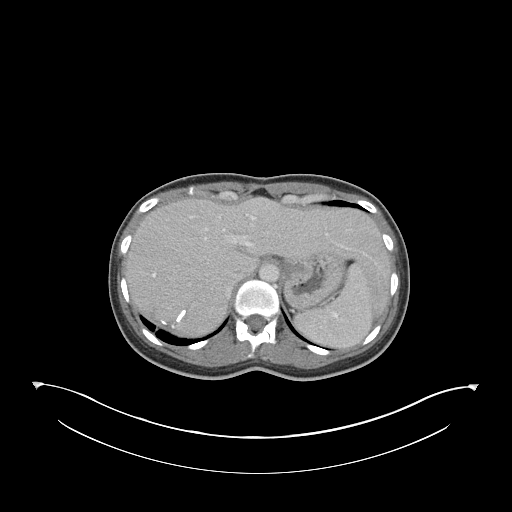
[im 87/94  soft-tissue]
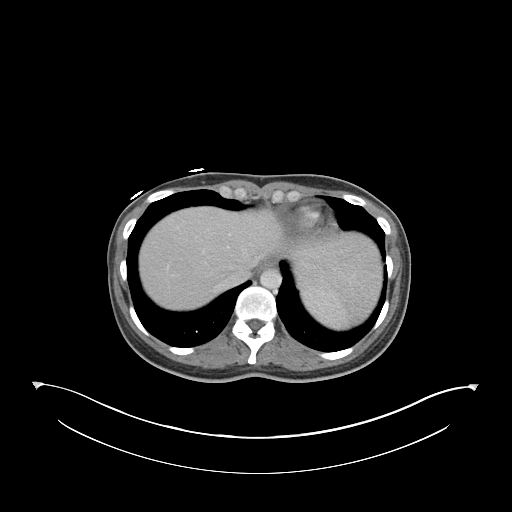

[Series 5: coronal st · coronal · 0.77mm/px · 3 of 136 slices shown]
[im 46/136  soft-tissue]
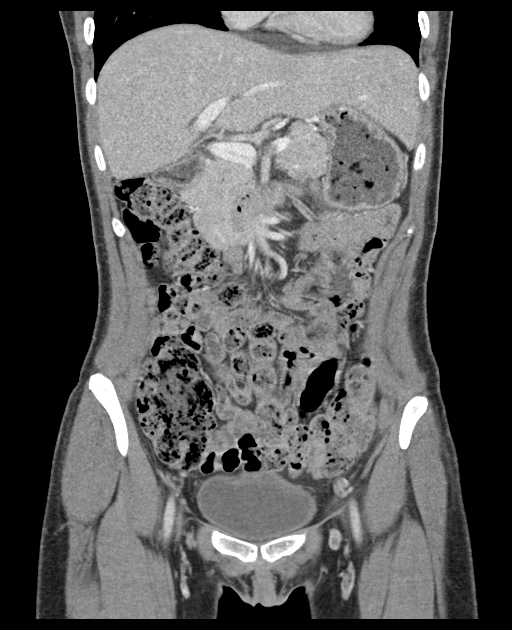
[im 61/136  soft-tissue]
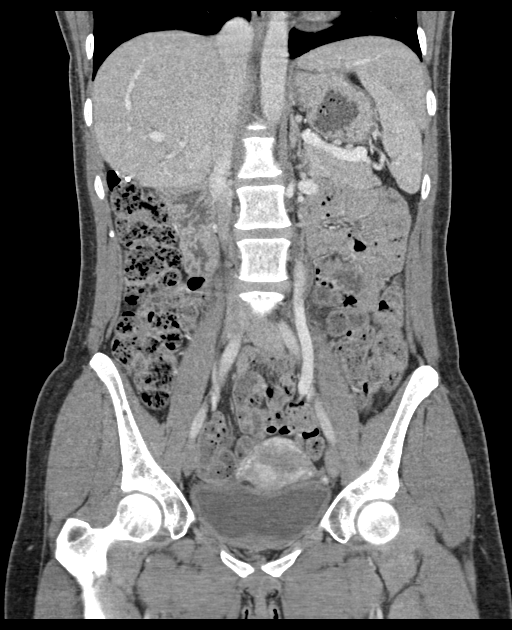
[im 76/136  soft-tissue]
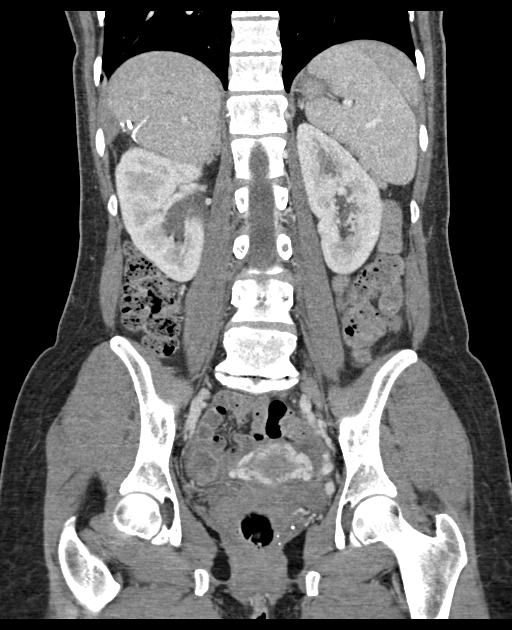

[15 of 46 positions shown; findings below may reference images not displayed]

RADIATION DOSE REDUCTION: This exam was performed according to the
departmental dose-optimization program which includes automated
exposure control, adjustment of the mA and/or kV according to
patient size and/or use of iterative reconstruction technique.

CONTRAST:  100mL OMNIPAQUE IOHEXOL 300 MG/ML  SOLN
FINDINGS: Lower chest: Small linear densities in the right lower lung fields
may suggest minimal scarring or subsegmental atelectasis with no
significant interval change. Small pericardial effusion is present.

Hepatobiliary: No new focal abnormality is seen in the liver.
Postsurgical changes are noted along the posterior margin of right
lobe. Gallbladder is not seen.

Pancreas: No focal abnormality is seen.

Spleen: Unremarkable.

Adrenals/Urinary Tract: Adrenals are not enlarged. There is no
hydronephrosis. There are no renal or ureteral stones. Urinary
bladder is not distended.

Stomach/Bowel: Stomach is moderately distended. Small bowel loops
are not dilated. Appendix is difficult to visualize. In coronal
image 64, there is a small caliber tubular structure with air in the
lumen in the right pelvis, possibly normal appendix. There is no
focal pericecal inflammation. Moderate to large amount of stool is
seen in the colon. There is no significant wall thickening in colon.
There is no pericolic stranding. Surgical staples are seen in the
sigmoid.

Vascular/Lymphatic: Unremarkable.

Reproductive: There are prominent tortuous vessels in both sides of
pelvis, possibly suggesting pelvic venous congestion. There is
interval clearing of large right adnexal cyst.

Other: There is no ascites or pneumoperitoneum. There is
paraumbilical hernia containing small portion of colon. There is
linear density in the subcutaneous plane in the left anterior
abdominal wall, possibly trocar site for previous surgery. This
finding has not changed.

Musculoskeletal: Degenerative changes are noted with encroachment of
disc space narrowing, bony spurs and neural foramina at L5-S1 level.
IMPRESSION: There is no evidence of intestinal obstruction or pneumoperitoneum.
There is no hydronephrosis.

There is paraumbilical hernia containing small portion of a bowel
loop without signs of incarceration or obstruction.

Small pericardial effusion. There are ectatic vessels in both
adnexal regions, possibly suggesting pelvic venous congestion.

## 2022-01-22 MED ORDER — METHOCARBAMOL 750 MG PO TABS: 750.0000 mg | ORAL_TABLET | Freq: Three times a day (TID) | ORAL | 0 refills | Status: DC | PRN

## 2022-01-22 MED ORDER — ACETAMINOPHEN 500 MG PO TABS
1000.0000 mg | ORAL_TABLET | Freq: Once | ORAL | Status: AC
  Administered 2022-01-22: 1000 mg via ORAL
  Filled 2022-01-22: qty 2

## 2022-01-22 MED ORDER — IOHEXOL 300 MG/ML  SOLN
100.0000 mL | Freq: Once | INTRAMUSCULAR | Status: AC | PRN
  Administered 2022-01-22: 100 mL via INTRAVENOUS

## 2022-01-22 MED ORDER — CEPHALEXIN 500 MG PO CAPS
500.0000 mg | ORAL_CAPSULE | Freq: Once | ORAL | Status: AC
  Administered 2022-01-22: 500 mg via ORAL
  Filled 2022-01-22: qty 1

## 2022-01-22 MED ORDER — CEPHALEXIN 500 MG PO CAPS: 1000.0000 mg | ORAL_CAPSULE | Freq: Two times a day (BID) | ORAL | 0 refills | Status: DC

## 2022-01-22 NOTE — Telephone Encounter (Signed)
Pt called this morning upset and crying because she saw in MyChart that her recent ultrasound showed a solid mass on her ovary.  The ultrasound was ordered by the pt's GYN.  Pt has tried calling the GYN's office several times regarding ultrasound results but has not gotten a response; therefore, pt called Dr. Ernestina Penna office for Dr. Burr Medico to go over the ultrasound results with her.  Informed pt that she will need to speak with the ordering provider regarding her test results.  Pt verbalized understanding.  Informed pt that this RN will notify Dr. Burr Medico of the pt's call. ?

## 2022-01-22 NOTE — Discharge Instructions (Addendum)
It was our pleasure to provide your ER care today - we hope that you feel better. ? ?Take acetaminophen or ibuprofen as need. You may also take robaxin as need for muscle pain/spasm - no driving when taking. The lab tests show a possible urine infection - take antibiotic as prescribed (keflex).  ? ?Your ct scan was read as showing no acute process. Incidental note was made of small paraumbilical hernia, and small pericardial effusion. Discuss with your doctor at follow up. For small hernia, follow up with your general surgeon. ? ?For ovarian cyst/mass, follow up with your ob/gyn doctor as planned. ? ?Return to ER if worse, new symptoms, fevers, worsening or severe pain, or other concern.  ?

## 2022-01-22 NOTE — ED Provider Notes (Signed)
?[facility] DEPT ?Provider Note ? ? ?CSN: [id number] ?Arrival date & time: 01/22/22  1743 ? ?  ? ?History ? ?Chief Complaint  ?Patient presents with  ? Back Pain  ? ? ?Stephanie [name] is a 41 y.o. female. ? ?Patient c/o intermittent right mid back pain in past couple weeks. Dull pain, occasionally worse w certain movements, positional changes. Denies specific injury or strain to area, although does indicate recently moved, lifting/bending. No radicular pain. No constant pain. No anterior pain. No chest pain or abd pain. Normal appetite. No nvd. No dysuria although notes hx uti. No midline/spine area pain. Denies redness/swelling/rash to area. No fever or chills. Indicates has appt w gyn upcoming re ovarian cysts. Also hx tx for colon ca w liver lesion last year.  ? ?The history is provided by the patient and medical records.  ?Back Pain ?Associated symptoms: no abdominal pain, no chest pain, no dysuria, no fever and no headaches   ? ?  ? ?Home Medications ?Prior to Admission medications   ?Medication Sig Start Date End Date Taking? Authorizing Provider  ?acetaminophen (TYLENOL) 325 MG tablet Take 2 tablets (650 mg total) by mouth every 6 (six) hours as needed for mild pain, moderate pain, fever or headache (fever > 101). ?Patient not taking: Reported on 11/21/2021 05/31/21   [name], MD  ?cyclobenzaprine (FLEXERIL) 10 MG tablet Take 1 tablet (10 mg total) by mouth 3 (three) times daily as needed for muscle spasms. ?Patient not taking: Reported on 11/21/2021 09/27/21   Barrie Folk, PA-C  ?HYDROcodone-acetaminophen (NORCO/VICODIN) 5-325 MG tablet Take 1-2 tablets by mouth every 6 (six) hours as needed for severe pain. ?Patient not taking: Reported on 11/21/2021 10/08/21   Griffin Basil, MD  ?ibuprofen (ADVIL) 600 MG tablet Take 1 tablet (600 mg total) by mouth every 6 (six) hours as needed for headache, mild pain, moderate pain or cramping. ?Patient not taking: Reported  on 11/21/2021 10/08/21   Griffin Basil, MD  ?Lactic Ac-Citric Ac-Pot Bitart (PHEXXI) 1.8-1-0.4 % GEL Place 5 g vaginally as needed. ?Patient not taking: Reported on 08/22/2021 08/05/21   Elvera Maria, CNM  ?meloxicam (MOBIC) 7.5 MG tablet Take 1 tablet (7.5 mg total) by mouth daily. ?Patient not taking: Reported on 08/22/2021 04/28/21   Montine Circle, PA-C  ?Norethindrone Acetate-Ethinyl Estradiol (LOESTRIN 1.5/30, 21,) 1.5-30 MG-MCG tablet Take 1 tablet by mouth daily. 09/10/21   Griffin Basil, MD  ?Prenatal Vit-Fe Fumarate-FA (PRENATAL MULTIVITAMIN) TABS tablet Take 1 tablet by mouth daily at 12 noon.    [provider]  ?   ? ?Allergies    ?Oxaliplatin   ? ?Review of Systems   ?Review of Systems  ?Constitutional:  Negative for fever.  ?HENT:  Negative for sore throat.   ?Eyes:  Negative for redness.  ?Respiratory:  Negative for cough and shortness of breath.   ?Cardiovascular:  Negative for chest pain and leg swelling.  ?Gastrointestinal:  Negative for abdominal pain and vomiting.  ?Genitourinary:  Negative for dysuria and flank pain.  ?Musculoskeletal:  Positive for back pain. Negative for neck pain.  ?Skin:  Negative for rash.  ?Neurological:  Negative for headaches.  ?Hematological:  Does not bruise/bleed easily.  ?Psychiatric/Behavioral:  Negative for confusion.   ? ?Physical Exam ?Updated Vital Signs ?BP 114/77   Pulse (!) 58   Temp 97.8 ?F (36.6 ?C)   Resp 16   LMP 01/05/2022 (Approximate)   SpO2 98%  ?Physical Exam ?Vitals and  nursing note reviewed.  ?Constitutional:   ?   Appearance: Normal appearance. She is well-developed.  ?HENT:  ?   Head: Atraumatic.  ?   Nose: Nose normal.  ?   Mouth/Throat:  ?   Mouth: Mucous membranes are moist.  ?Eyes:  ?   General: No scleral icterus. ?   Conjunctiva/sclera: Conjunctivae normal.  ?Neck:  ?   Trachea: No tracheal deviation.  ?Cardiovascular:  ?   Rate and Rhythm: Normal rate and regular rhythm.  ?   Pulses: Normal pulses.  ?   Heart  sounds: Normal heart sounds. No murmur heard. ?  No friction rub. No gallop.  ?Pulmonary:  ?   Effort: Pulmonary effort is normal. No respiratory distress.  ?   Breath sounds: Normal breath sounds.  ?Abdominal:  ?   General: Bowel sounds are normal. There is no distension.  ?   Palpations: Abdomen is soft. There is no mass.  ?   Tenderness: There is no abdominal tenderness. There is no guarding.  ?Genitourinary: ?   Comments: No cva tenderness.  ?Musculoskeletal:     ?   General: No swelling.  ?   Cervical back: Normal range of motion and neck supple. No rigidity. No muscular tenderness.  ?   Comments: T/L spine non tender, aligned. No sts to back. ?mild musculoskeletal tenderness.   ?Skin: ?   General: Skin is warm and dry.  ?   Findings: No rash.  ?Neurological:  ?   Mental Status: She is alert.  ?   Comments: Alert, speech normal.   ?Psychiatric:     ?   Mood and Affect: Mood normal.  ? ? ?ED Results / Procedures / Treatments   ?Labs ?(all labs ordered are listed, but only abnormal results are displayed) ?Results for orders placed or performed during the hospital encounter of 01/22/22  ?Comprehensive metabolic panel  ?Result Value Ref Range  ? Sodium 140 135 - 145 mmol/L  ? Potassium 3.9 3.5 - 5.1 mmol/L  ? Chloride 109 98 - 111 mmol/L  ? CO2 26 22 - 32 mmol/L  ? Glucose, Bld 98 70 - 99 mg/dL  ? BUN 12 6 - 20 mg/dL  ? Creatinine, Ser 0.66 0.44 - 1.00 mg/dL  ? Calcium 8.6 (L) 8.9 - 10.3 mg/dL  ? Total Protein 6.2 (L) 6.5 - 8.1 g/dL  ? Albumin 3.5 3.5 - 5.0 g/dL  ? AST 18 15 - 41 U/L  ? ALT 16 0 - 44 U/L  ? Alkaline Phosphatase 36 (L) 38 - 126 U/L  ? Total Bilirubin 0.6 0.3 - 1.2 mg/dL  ? GFR, Estimated >60 >60 mL/min  ? Anion gap 5 5 - 15  ?CBC with Differential  ?Result Value Ref Range  ? WBC 5.4 4.0 - 10.5 K/uL  ? RBC 4.27 3.87 - 5.11 MIL/uL  ? Hemoglobin 13.6 12.0 - 15.0 g/dL  ? HCT 40.7 36.0 - 46.0 %  ? MCV 95.3 80.0 - 100.0 fL  ? MCH 31.9 26.0 - 34.0 pg  ? MCHC 33.4 30.0 - 36.0 g/dL  ? RDW 12.5 11.5 - 15.5 %   ? Platelets 127 (L) 150 - 400 K/uL  ? nRBC 0.0 0.0 - 0.2 %  ? Neutrophils Relative % 63 %  ? Neutro Abs 3.4 1.7 - 7.7 K/uL  ? Lymphocytes Relative 26 %  ? Lymphs Abs 1.4 0.7 - 4.0 K/uL  ? Monocytes Relative 8 %  ? Monocytes Absolute 0.4 0.1 - 1.0  K/uL  ? Eosinophils Relative 1 %  ? Eosinophils Absolute 0.1 0.0 - 0.5 K/uL  ? Basophils Relative 1 %  ? Basophils Absolute 0.0 0.0 - 0.1 K/uL  ? nRBC HIDE 0 /100 WBC  ? Immature Granulocytes 1 %  ? Abs Immature Granulocytes 0.06 0.00 - 0.07 K/uL  ?Urinalysis, Routine w reflex microscopic Urine, Clean Catch  ?Result Value Ref Range  ? Color, Urine YELLOW YELLOW  ? APPearance HAZY (A) CLEAR  ? Specific Gravity, Urine 1.013 1.005 - 1.030  ? pH 6.0 5.0 - 8.0  ? Glucose, UA NEGATIVE NEGATIVE mg/dL  ? Hgb urine dipstick NEGATIVE NEGATIVE  ? Bilirubin Urine NEGATIVE NEGATIVE  ? Ketones, ur NEGATIVE NEGATIVE mg/dL  ? Protein, ur NEGATIVE NEGATIVE mg/dL  ? Nitrite NEGATIVE NEGATIVE  ? Leukocytes,Ua SMALL (A) NEGATIVE  ? RBC / HPF 0-5 0 - 5 RBC/hpf  ? WBC, UA 6-10 0 - 5 WBC/hpf  ? Bacteria, UA MANY (A) NONE SEEN  ? Squamous Epithelial / LPF 0-5 0 - 5  ? Mucus PRESENT   ? Ca Oxalate Crys, UA PRESENT   ?Lipase, blood  ?Result Value Ref Range  ? Lipase 45 11 - 51 U/L  ? ?US PELVIS TRANSVAGINAL NON-OB (TV ONLY) ? ?Result Date: 01/21/2022 ?CLINICAL DATA:  Follow-up right ovarian cyst. History of metastatic colon cancer. EXAM: TRANSABDOMINAL AND TRANSVAGINAL ULTRASOUND OF PELVIS TECHNIQUE: Both transabdominal and transvaginal ultrasound examinations of the pelvis were performed. Transabdominal technique was performed for global imaging of the pelvis including uterus, ovaries, adnexal regions, and pelvic cul-de-sac. It was necessary to proceed with endovaginal exam following the transabdominal exam to visualize the 2.3 cm fibroid again noted. COMPARISON:  Ultrasound 11/19/2021. FINDINGS: Uterus Measurements: 8.1 x 4.7 x 5.4 cm = volume: 107.5 mL. 2.3 cm uterine fibroid again noted.  Endometrium Thickness: 9.6 mm.  No focal abnormality visualized. Right ovary Measurements: 3.5 x 3.2 x 2.9 cm = volume: 17.2 mL. 3.2 x 2.8 x 3.1 cm solid heterogeneous mass is again noted. Some degree of peripheral

## 2022-01-22 NOTE — ED Provider Triage Note (Signed)
Emergency Medicine Provider Triage Evaluation Note ? ?Stephanie [name] , a 41 y.o. female  was evaluated in triage.  Pt complains of right-sided flank pain for the last couple of weeks described as achy and sore.  Patient was diagnosed with stage IV colon cancer over 1 year ago with a liver resection 1 year ago as well.  She has had intermittent pain from the area since then but has been pain-free for some time now.  She expresses concern that she is not sure if this is normal postoperative pain versus new development of her cancer.  She also endorses night sweats ? ?Of note, she also has been dealing with a large ovarian cyst on her right ovary that causes her sharp pain.  She just had an ultrasound done 2 days ago but cannot receive the results for her PCP.  Per the imaging results viewable on her chart, it appears that there is concern that the cyst is in fact a mass given her history of metastatic cancer. ? ?She denies abdominal pain otherwise, nausea, vomiting and diarrhea.  She denies fever, chills. ? ?Review of Systems  ?Positive: As above ?Negative: As above ? ?Physical Exam  ?BP 134/85 (BP Location: Left Arm)   Pulse 68   Temp 98 ?F (36.7 ?C) (Oral)   Resp 18   LMP 01/05/2022 (Approximate)   SpO2 100%  ?Gen:   Awake, no distress   ?Resp:  Normal effort  ?MSK:   Moves extremities without difficulty  ?Other:  Flanks nontender to palpation, negative CVA tenderness bilaterally.  Negative Murphy sign.  Mild TTP of the right pelvic region. ? ?Medical Decision Making  ?Medically screening exam initiated at 7:13 PM.  Appropriate orders placed.  Stephanie [name] was informed that the remainder of the evaluation will be completed by another provider, this initial triage assessment does not replace that evaluation, and the importance of remaining in the ED until their evaluation is complete. ? ? ?  ?Stephanie [name], [place] ?01/22/22 1916 ? ?

## 2022-01-22 NOTE — ED Triage Notes (Signed)
Pt c/o right upper back pain.  Pt had a liver resection a year ago and she is concerned there may be a problem. Pt also c/o waking up w/ night sweats. Pt has been unable to follow up w/ oncologist d/t insurance issues. ?

## 2022-01-24 ENCOUNTER — Ambulatory Visit (INDEPENDENT_AMBULATORY_CARE_PROVIDER_SITE_OTHER): Payer: Medicaid Other | Admitting: Obstetrics and Gynecology

## 2022-01-24 VITALS — BP 116/78 | HR 96 | Ht 68.0 in | Wt 148.0 lb

## 2022-01-24 DIAGNOSIS — N83201 Unspecified ovarian cyst, right side: Secondary | ICD-10-CM

## 2022-01-24 NOTE — Progress Notes (Signed)
?CC: right ovarian cyst/mass ?Subjective:  ? ? Patient ID: Stephanie [name], female    DOB: 1980-12-19, 41 y.o.   MRN: [id number] ? ?HPI ?Pt seen for discussion of right ovarian mass.  Mass is slightly larger than previous.  Knowledge of the mass has caused the patient great anxiety due to her recent history of colon cancer.  She wishes to have the entire ovary removed. Pt understands that she will still have menses with one ovary, but if the other ovary is ever lost then she will be in surgical menopause.  Unless the mass is cystic, we will not be able to simply biopsy the mass.  If the ovary is removed it will be sent to pathology for further categorization.  Also discussed the patient's extensive surgical history including bowel resection and reanastomosis.  This will increase surgical risk due to the possible presence of adhesions. ? ? ?Review of Systems ? ?   ?Objective:  ? Physical Exam ?Vitals:  ? 01/24/22 0845  ?BP: 116/78  ?Pulse: 96  ?CLINICAL DATA:  Follow-up right ovarian cyst. History of metastatic ?colon cancer. ?  ?EXAM: ?TRANSABDOMINAL AND TRANSVAGINAL ULTRASOUND OF PELVIS ?  ?TECHNIQUE: ?Both transabdominal and transvaginal ultrasound examinations of the ?pelvis were performed. Transabdominal technique was performed for ?global imaging of the pelvis including uterus, ovaries, adnexal ?regions, and pelvic cul-de-sac. It was necessary to proceed with ?endovaginal exam following the transabdominal exam to visualize the ?2.3 cm fibroid again noted. ?  ?COMPARISON:  Ultrasound 11/19/2021. ?  ?FINDINGS: ?Uterus ?  ?Measurements: 8.1 x 4.7 x 5.4 cm = volume: 107.5 mL. 2.3 cm uterine ?fibroid again noted. ?  ?Endometrium ?  ?Thickness: 9.6 mm.  No focal abnormality visualized. ?  ?Right ovary ?  ?Measurements: 3.5 x 3.2 x 2.9 cm = volume: 17.2 mL. 3.2 x 2.8 x 3.1 ?cm solid heterogeneous mass is again noted. Some degree of ?peripheral blood flow noted on Doppler exam. Slight interval growth ?from prior  exam. Again although this may represent a process such as ?a dermoid or endometrioma other etiologies including ovarian ?malignancy or metastatic disease (given the patient's history of ?metastatic colon cancer), cannot be excluded. ?  ?Left ovary ?  ?Measurements: 3.0 x 1.4 x 1.6 cm = volume: 4.9 mL. 1.9 cm simple ?cyst. ?  ?Other findings ?  ?Small amount of free pelvic fluid. ?  ?IMPRESSION: ?1. 3.2 x 2.8 x 3.1 solid heterogeneous mass is again noted in the ?right ovary. Some degree of peripheral blood flow noted. Slight ?interval growth from prior exam. Again although this may represent a ?process such as a dermoid or endometrioma, other etiologies ?including ovarian malignancy or metastatic disease (given patient's ?history of metastatic colon cancer), cannot be excluded. Again ?pelvic MRI may prove useful for further evaluation. ?  ?2.  1.9 cm simple left ovarian cyst. ?  ?3. 2.3 cm uterine fibroid again noted. Small amount of free pelvic ?fluid. ? ? ? ?   ?Assessment & Plan:  ? ?1. Cyst of right ovary ?Will proceed with exploratory laparoscopy, right oophorectomy and possible exploratory laparotomy.  Will schedule preop close to the the time of surgery.  Pictures of abdomen taken today and placed in media to aid in surgical planning. ? ?I spent 20 minutes dedicated to the care of this patient including previsit review of records, face to face time with the patient discussing option, and post visit testing.  ? ? ? ?Griffin Basil, MD ?Faculty Attending, [facility]  ?

## 2022-01-24 NOTE — Progress Notes (Signed)
Follow up US results ?Has question about OCP: got different brand at pharmacy this time ?

## 2022-01-27 ENCOUNTER — Other Ambulatory Visit: Payer: Self-pay | Admitting: Obstetrics and Gynecology

## 2022-01-27 DIAGNOSIS — Z1231 Encounter for screening mammogram for malignant neoplasm of breast: Secondary | ICD-10-CM

## 2022-01-30 ENCOUNTER — Telehealth: Payer: Self-pay

## 2022-01-30 NOTE — Telephone Encounter (Signed)
Pt LVM stating they she is having her Ovarian Cyst removed in July 2023.  Pt wanted to make Dr. Burr Medico aware and to get Dr. Ernestina Penna opinion.  Sent Patient Call message to Dr. Burr Medico. ?

## 2022-02-03 ENCOUNTER — Other Ambulatory Visit: Payer: Self-pay

## 2022-02-03 ENCOUNTER — Telehealth: Payer: Self-pay | Admitting: *Deleted

## 2022-02-03 DIAGNOSIS — N83202 Unspecified ovarian cyst, left side: Secondary | ICD-10-CM

## 2022-02-03 NOTE — Telephone Encounter (Signed)
Spoke with the patient and scheduled a new patient with Dr Berline Lopes on 5/22 at 9:45 am. Patient given an arrival time of 9:15 am. Patient also given the address and phone number for the clinic; along with the policy for mask and visitors.   ?

## 2022-02-04 ENCOUNTER — Encounter: Payer: Self-pay | Admitting: Gynecologic Oncology

## 2022-02-04 ENCOUNTER — Other Ambulatory Visit: Payer: Self-pay

## 2022-02-04 ENCOUNTER — Other Ambulatory Visit: Payer: Self-pay | Admitting: Obstetrics and Gynecology

## 2022-02-04 DIAGNOSIS — N83201 Unspecified ovarian cyst, right side: Secondary | ICD-10-CM

## 2022-02-04 MED ORDER — HYDROCODONE-ACETAMINOPHEN 5-325 MG PO TABS: 1.0000 | ORAL_TABLET | Freq: Four times a day (QID) | ORAL | 0 refills | Status: DC | PRN

## 2022-02-04 NOTE — Progress Notes (Signed)
Rx for vicodin due to continued abdominal pain ?

## 2022-02-09 ENCOUNTER — Other Ambulatory Visit: Payer: Self-pay | Admitting: Obstetrics and Gynecology

## 2022-02-09 DIAGNOSIS — Z30011 Encounter for initial prescription of contraceptive pills: Secondary | ICD-10-CM

## 2022-02-14 ENCOUNTER — Telehealth: Payer: Self-pay

## 2022-02-14 NOTE — Telephone Encounter (Signed)
Received call from Ms. Sigal. Patient wishes to cancel her appointment with Dr. Berline Lopes on 02/17/22 as she had an appointment with GYN Oncology in [facility] last week.

## 2022-02-17 ENCOUNTER — Inpatient Hospital Stay: Payer: BC Managed Care – PPO | Admitting: Gynecologic Oncology

## 2022-02-17 DIAGNOSIS — N838 Other noninflammatory disorders of ovary, fallopian tube and broad ligament: Secondary | ICD-10-CM

## 2022-02-27 ENCOUNTER — Telehealth: Payer: Self-pay

## 2022-02-27 ENCOUNTER — Telehealth: Payer: Self-pay | Admitting: Physician Assistant

## 2022-02-27 ENCOUNTER — Ambulatory Visit: Payer: Medicaid Other

## 2022-02-27 DIAGNOSIS — C19 Malignant neoplasm of rectosigmoid junction: Secondary | ICD-10-CM

## 2022-02-27 NOTE — Telephone Encounter (Signed)
[facility] Pathology Dept/Client Services Team at [phone] to add-on a foundation One to pt's recent pathology specimen taken on 02/25/2022.  [facility] stated they do no use Foundation One and in order to add-on Foundation One will have to request the sample.  [facility] Janett Billow) [phone] who stated they must have an order from the requesting provider and can be done through their Foundation One portal.  Janett Billow walked this RN through submitting the order on their Foundation One Portal especially since this pt has had a Foundation One done on her previously.  The Foundation One was successfully submitted and the pathology report along with the Order Requisition form was faxed to [facility] at [phone].  Fax confirmation was received.    Joyice Faster, PA-C ordered a PET Scan to be done within the next 1 or 2.  Sent staff message to the Revenue Team to obtain PA on PET Scan.  Awaiting Revenue Team's response.   Specimen was obtain on 02/25/2022 by Dr. Andee Poles B. Rocchio from pt's right ovary.  Case# [id number]

## 2022-02-27 NOTE — Telephone Encounter (Signed)
I spoke to [name] by phone to review the path report after she underwent robotic right salpingo oophorectomy and left salpingo-oophorectomy with extensive lysis of adhesions bilaterally on 02/25/2022.  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  We will arrange for PET/CT imaging in the next 2 weeks to assess for other sites of metastatic disease.  Dr. Burr Medico will see the patient to review the PET imaging and discuss resuming systemic chemotherapy.  We will request foundation one testing as well.  Patient expressed understanding of this plan provided and is aware to reach out if she has any additional questions or concerns.

## 2022-02-28 ENCOUNTER — Encounter: Payer: Self-pay | Admitting: Hematology

## 2022-03-04 ENCOUNTER — Encounter: Payer: Self-pay | Admitting: Hematology

## 2022-03-04 ENCOUNTER — Ambulatory Visit: Payer: Medicaid Other | Admitting: Obstetrics

## 2022-03-05 ENCOUNTER — Telehealth: Payer: Self-pay | Admitting: Emergency Medicine

## 2022-03-05 NOTE — Telephone Encounter (Signed)
[id number] - TREATMENT OF ESTABLISHED CHEMOTHERAPY-INDUCED NEUROPATHY WITH N-PALMITOYLETHANOLAMIDE, A CANNABIMIMETIC NUTRACEUTICAL: A RANDOMIZED DOUBLE-BLIND PHASE II PILOT TRIAL  12 MONTH F/U  Contacted patient for 12 month f/u call.  Verified correct patient identity using two identifiers.  Patient reports ongoing, unchanged neuropathy in bilateral hands and feet since previous phone call.  She reports not taking any neuropathy medication at this time.  Patient recently had ovarian surgery/removal, pathology showed recurrent metastatic disease from colorectal primary.  Patient is following up on this and may resume chemo treatment after further workup with oncologist.  Patient denies any questions/concerns at this time.  [name] 'Learta CoddingNeysa Bonito, RN, BSN Clinical Research Nurse I 03/05/22 4:50 PM

## 2022-03-07 ENCOUNTER — Ambulatory Visit (HOSPITAL_COMMUNITY): Admission: RE | Admit: 2022-03-07 | Payer: Medicaid Other | Source: Ambulatory Visit

## 2022-03-10 ENCOUNTER — Telehealth: Payer: Self-pay | Admitting: Hematology

## 2022-03-10 ENCOUNTER — Telehealth: Payer: Self-pay

## 2022-03-10 NOTE — Telephone Encounter (Signed)
This nurse reached out to patient related to miss PET on 6/9.  Patient stated that she was not aware of Pet appointment.  Rescheduled PET is for Wed 6/14 with labs before scan.  Patient will have appointment with provider on Friday 6/16.  This nurse made patient aware of changes to schedule, also made aware of prep for exam.  Patient acknowledge understanding. No further questions or concerns at this time.

## 2022-03-10 NOTE — Telephone Encounter (Signed)
.  Called pt per 6/12 inbasket , Patient was unavailable, a message with appt time and date was left with number on file.

## 2022-03-11 ENCOUNTER — Other Ambulatory Visit: Payer: Medicaid Other

## 2022-03-11 ENCOUNTER — Ambulatory Visit: Payer: Medicaid Other | Admitting: Hematology

## 2022-03-12 ENCOUNTER — Inpatient Hospital Stay: Payer: BC Managed Care – PPO | Attending: Nurse Practitioner

## 2022-03-12 ENCOUNTER — Other Ambulatory Visit: Payer: Self-pay

## 2022-03-12 ENCOUNTER — Ambulatory Visit (HOSPITAL_COMMUNITY)
Admission: RE | Admit: 2022-03-12 | Discharge: 2022-03-12 | Disposition: A | Discharge status: Home / Self Care | Payer: BC Managed Care – PPO | Source: Ambulatory Visit | Attending: Physician Assistant | Admitting: Physician Assistant

## 2022-03-12 DIAGNOSIS — G620 Drug-induced polyneuropathy: Secondary | ICD-10-CM | POA: Insufficient documentation

## 2022-03-12 DIAGNOSIS — N83201 Unspecified ovarian cyst, right side: Secondary | ICD-10-CM | POA: Insufficient documentation

## 2022-03-12 DIAGNOSIS — Z95828 Presence of other vascular implants and grafts: Secondary | ICD-10-CM

## 2022-03-12 DIAGNOSIS — Z5111 Encounter for antineoplastic chemotherapy: Secondary | ICD-10-CM | POA: Insufficient documentation

## 2022-03-12 DIAGNOSIS — C19 Malignant neoplasm of rectosigmoid junction: Secondary | ICD-10-CM | POA: Diagnosis present

## 2022-03-12 DIAGNOSIS — C787 Secondary malignant neoplasm of liver and intrahepatic bile duct: Secondary | ICD-10-CM | POA: Insufficient documentation

## 2022-03-12 DIAGNOSIS — C7961 Secondary malignant neoplasm of right ovary: Secondary | ICD-10-CM | POA: Insufficient documentation

## 2022-03-12 DIAGNOSIS — D696 Thrombocytopenia, unspecified: Secondary | ICD-10-CM | POA: Insufficient documentation

## 2022-03-12 DIAGNOSIS — N83202 Unspecified ovarian cyst, left side: Secondary | ICD-10-CM | POA: Insufficient documentation

## 2022-03-12 DIAGNOSIS — C779 Secondary and unspecified malignant neoplasm of lymph node, unspecified: Secondary | ICD-10-CM | POA: Insufficient documentation

## 2022-03-12 LAB — CBC WITH DIFFERENTIAL (CANCER CENTER ONLY)
Abs Immature Granulocytes: 0.01 10*3/uL (ref 0.00–0.07)
Basophils Absolute: 0 10*3/uL (ref 0.0–0.1)
Basophils Relative: 0 %
Eosinophils Absolute: 0.1 10*3/uL (ref 0.0–0.5)
Eosinophils Relative: 1 %
HCT: 42.9 % (ref 36.0–46.0)
Hemoglobin: 15 g/dL (ref 12.0–15.0)
Immature Granulocytes: 0 %
Lymphocytes Relative: 17 %
Lymphs Abs: 1.2 10*3/uL (ref 0.7–4.0)
MCH: 32.3 pg (ref 26.0–34.0)
MCHC: 35 g/dL (ref 30.0–36.0)
MCV: 92.3 fL (ref 80.0–100.0)
Monocytes Absolute: 0.6 10*3/uL (ref 0.1–1.0)
Monocytes Relative: 8 %
Neutro Abs: 5.4 10*3/uL (ref 1.7–7.7)
Neutrophils Relative %: 74 %
Platelet Count: 129 10*3/uL — ABNORMAL LOW (ref 150–400)
RBC: 4.65 MIL/uL (ref 3.87–5.11)
RDW: 12.3 % (ref 11.5–15.5)
WBC Count: 7.3 10*3/uL (ref 4.0–10.5)
nRBC: 0 % (ref 0.0–0.2)

## 2022-03-12 LAB — CMP (CANCER CENTER ONLY)
ALT: 21 U/L (ref 0–44)
AST: 17 U/L (ref 15–41)
Albumin: 4.4 g/dL (ref 3.5–5.0)
Alkaline Phosphatase: 58 U/L (ref 38–126)
Anion gap: 6 (ref 5–15)
BUN: 13 mg/dL (ref 6–20)
CO2: 26 mmol/L (ref 22–32)
Calcium: 9.7 mg/dL (ref 8.9–10.3)
Chloride: 106 mmol/L (ref 98–111)
Creatinine: 0.69 mg/dL (ref 0.44–1.00)
GFR, Estimated: >60 mL/min (ref >60)
Glucose, Bld: 111 mg/dL — ABNORMAL HIGH (ref 70–99)
Potassium: 3.8 mmol/L (ref 3.5–5.1)
Sodium: 138 mmol/L (ref 135–145)
Total Bilirubin: 0.5 mg/dL (ref 0.3–1.2)
Total Protein: 7.3 g/dL (ref 6.5–8.1)

## 2022-03-12 LAB — CEA (IN HOUSE-CHCC): CEA (CHCC-In House): 3.36 ng/mL (ref 0.00–5.00)

## 2022-03-12 LAB — GLUCOSE, CAPILLARY: Glucose-Capillary: 107 mg/dL — ABNORMAL HIGH (ref 70–99)

## 2022-03-12 IMAGING — CT NM PET TUM IMG INITIAL (PI) SKULL BASE T - THIGH
1 of 8 series · 1 of 25 positions shown · non-contrast
Comparison: [DATE]

CLINICAL DATA: Subsequent treatment strategy for rectal carcinoma.
Concern for recurrence at the rectosigmoid junction. RIGHT ovary
recurrence with resection.

EXAM:
NUCLEAR MEDICINE PET SKULL BASE TO THIGH
TECHNIQUE: 7.4 mCi F-18 FDG was injected intravenously. Full-ring PET imaging
was performed from the skull base to thigh after the radiotracer. CT
data was obtained and used for attenuation correction and anatomic
localization.
Fasting blood glucose: 107 mg/dl

[Series 4: ct sk_thigh 5.0 br38 · axial · 5.0mm · 0.98mm/px · 1 of 239 slices shown]
[im 239/239  brain]
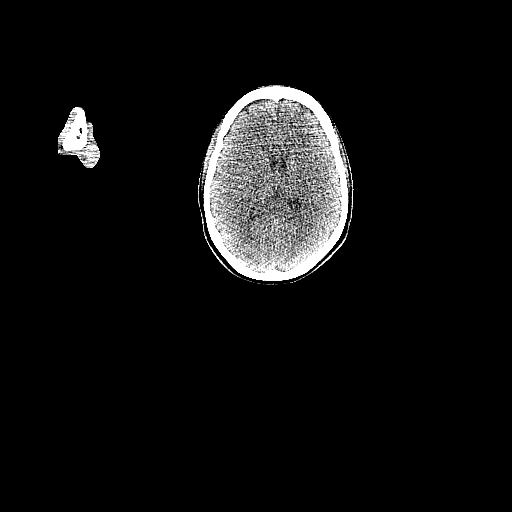

[1 of 25 positions shown; findings below may reference images not displayed]

FINDINGS: Mediastinal blood pool activity:1.8

Liver activity: SUV max NA

NECK: No hypermetabolic lymph nodes in the neck.

Incidental CT findings: none

CHEST: No hypermetabolic mediastinal or hilar nodes. No suspicious
pulmonary nodules on the CT scan.

Incidental CT findings: Port in the anterior chest wall with tip in
distal SVC.

ABDOMEN/PELVIS: No abnormal hypermetabolic activity within the
liver, pancreas, adrenal glands, or spleen. No hypermetabolic lymph
nodes in the abdomen or pelvis.

Anastomosis at rectosigmoid junction without metabolic activity. No
abnormal pelvic activity.

Incidental CT findings: none

SKELETON: No focal hypermetabolic activity to suggest skeletal
metastasis.

Incidental CT findings: none
IMPRESSION: No FDG PET/CT evidence of rectal carcinoma recurrence or metastasis.

## 2022-03-12 MED ORDER — HEPARIN SOD (PORK) LOCK FLUSH 100 UNIT/ML IV SOLN
500.0000 [IU] | Freq: Once | INTRAVENOUS | Status: AC
  Administered 2022-03-12: 500 [IU]

## 2022-03-12 MED ORDER — SODIUM CHLORIDE 0.9% FLUSH
10.0000 mL | Freq: Once | INTRAVENOUS | Status: AC
  Administered 2022-03-12: 10 mL

## 2022-03-12 MED ORDER — FLUDEOXYGLUCOSE F - 18 (FDG) INJECTION
7.3600 | Freq: Once | INTRAVENOUS | Status: AC | PRN
  Administered 2022-03-12: 7.36 via INTRAVENOUS

## 2022-03-14 ENCOUNTER — Other Ambulatory Visit: Payer: Self-pay

## 2022-03-14 ENCOUNTER — Inpatient Hospital Stay (HOSPITAL_BASED_OUTPATIENT_CLINIC_OR_DEPARTMENT_OTHER): Payer: BC Managed Care – PPO | Admitting: Hematology

## 2022-03-14 ENCOUNTER — Encounter: Payer: Self-pay | Admitting: Hematology

## 2022-03-14 VITALS — BP 118/82 | HR 84 | Temp 98.5°F | Resp 18 | Ht 68.0 in | Wt 142.9 lb

## 2022-03-14 DIAGNOSIS — C787 Secondary malignant neoplasm of liver and intrahepatic bile duct: Secondary | ICD-10-CM | POA: Diagnosis not present

## 2022-03-14 DIAGNOSIS — C779 Secondary and unspecified malignant neoplasm of lymph node, unspecified: Secondary | ICD-10-CM | POA: Diagnosis not present

## 2022-03-14 DIAGNOSIS — G620 Drug-induced polyneuropathy: Secondary | ICD-10-CM | POA: Diagnosis not present

## 2022-03-14 DIAGNOSIS — C19 Malignant neoplasm of rectosigmoid junction: Secondary | ICD-10-CM | POA: Diagnosis not present

## 2022-03-14 DIAGNOSIS — D696 Thrombocytopenia, unspecified: Secondary | ICD-10-CM | POA: Diagnosis not present

## 2022-03-14 DIAGNOSIS — Z5111 Encounter for antineoplastic chemotherapy: Secondary | ICD-10-CM | POA: Diagnosis present

## 2022-03-14 DIAGNOSIS — N83202 Unspecified ovarian cyst, left side: Secondary | ICD-10-CM | POA: Diagnosis not present

## 2022-03-14 DIAGNOSIS — N83201 Unspecified ovarian cyst, right side: Secondary | ICD-10-CM | POA: Diagnosis not present

## 2022-03-14 DIAGNOSIS — C7961 Secondary malignant neoplasm of right ovary: Secondary | ICD-10-CM | POA: Diagnosis not present

## 2022-03-14 MED ORDER — PROCHLORPERAZINE MALEATE 10 MG PO TABS: 10.0000 mg | ORAL_TABLET | Freq: Four times a day (QID) | ORAL | 1 refills | Status: DC | PRN

## 2022-03-14 MED ORDER — CAPECITABINE 500 MG PO TABS
850.0000 mg/m2 | ORAL_TABLET | Freq: Two times a day (BID) | ORAL | 1 refills | Status: DC
  Filled 2022-03-14: qty 84, 14d supply, fill #0

## 2022-03-14 MED ORDER — LIDOCAINE-PRILOCAINE 2.5-2.5 % EX CREA: TOPICAL_CREAM | CUTANEOUS | 3 refills | Status: DC

## 2022-03-14 MED ORDER — ONDANSETRON HCL 8 MG PO TABS: 8.0000 mg | ORAL_TABLET | Freq: Three times a day (TID) | ORAL | 0 refills | Status: DC | PRN

## 2022-03-14 NOTE — Progress Notes (Signed)
DISCONTINUE OFF PATHWAY REGIMEN - Colorectal   [id number]:mFOLFIRINOX (Leucovorin IV D1 + Fluorouracil CIV D1,2 + Irinotecan IV D1 + Oxaliplatin IV D1) q14 Days:   A cycle is every 14 days:     Oxaliplatin      Leucovorin      Irinotecan      Fluorouracil   **Always confirm dose/schedule in your pharmacy ordering system**  REASON: Disease Progression PRIOR TREATMENT: Off Pathway: mFOLFIRINOX (Leucovorin IV D1 + Fluorouracil CIV D1,2 + Irinotecan IV D1 + Oxaliplatin IV D1) q14 Days TREATMENT RESPONSE: Unable to Evaluate  START ON PATHWAY REGIMEN - Colorectal     A cycle is every 21 days:     Capecitabine      Oxaliplatin   **Always confirm dose/schedule in your pharmacy ordering system**  Patient Characteristics: Distant Metastases, Postoperative Treatment for R0 Resection Tumor Location: Colon Therapeutic Status: Distant Metastases  Intent of Therapy: Curative Intent, Discussed with Patient

## 2022-03-14 NOTE — Progress Notes (Signed)
[place]   [phone] [phone]   Clinic Follow up Note   Patient Care Team: [facility] as PCP - General [name], MD as Consulting Physician (Oncology) [name], MD as Consulting Physician (General Surgery) [name], NP as Nurse Practitioner (Nurse Practitioner) Griffin Basil, MD as Consulting Physician (Obstetrics and Gynecology)  Date of Service:  03/14/2022  CHIEF COMPLAINT: f/u of metastatic colon cancer  CURRENT THERAPY:  PENDING Chemo  ASSESSMENT & PLAN:  Stephanie [name] is a 41 y.o. female with   1. 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 She was found to have perforation and positive radial margin. Liver biopsy on 04/16/20 confirmed metastatic disease from her colon cancer -She received first line chemo with FOLFOXIRI q2 weeks for 6 months from 05/23/20 to 10/24/20. Bevacizumab added with C2.  -s/p liver resection on 12/13/20 under Dr. Barry Dienes. Pathology showed: metastatic colon carcinoma to liver showing approximately 80% necrosis, resection margin negative.  -s/p colostomy takedown on 05/30/21 with Dr. Barry Dienes. Pathology was benign. -she has been on surveillance. Scans have been NED except for new right ovarian/adnexal cyst in 09/2021 (not seen 07/2021). -she underwent right ovary removal 02/25/22 with Dr. Polly Cobia. Path revealed adenocarcinoma, likely colon origin.  I reviewed her pathology findings in detail with her.  This is likely peritoneal metastasis from her initial perforated colon cancer. -restaging PET on 03/12/22 showed NED.  I personally reviewed the images and discussed the findings with her. -I had a long conversation with her about neck steps today.  Although  she has no evidence on recent PET scan, there is a very high possibility that if she still has microscopic disease.  Her risk of recurrence is very high given the both liver and peritoneal metastasis.  I recommend pursuing adjuvant systemic treatment. I discussed different chemo options, including FOLFOX, CapeOx and Xeloda alone. My recommendation is for CAPEOX for 3 months, then continue xeloda alone for additional 3 months.  She previously had reaction  (tight throat and dyspnea), she pressed concern of infusion reaction again.  I recommend adding Benadryl, Pepcid in addition to steroids as premedication, and do dose reduction (also due to her mild residual neuropathy ), and slow infusion. I reassured her that anaphylactic reaction is rare.  After lengthy discussion, she agrees to proceed with Capox  -We also reviewed the option of HIPEC. Given the significant complications and side effect Baylynn Shifflett to surgery, and her no visible residual disease on PET scan, I do not recommend HIPEC.   2. Ovarian cysts, bilateral -new on recent CT scan in Jan 2023, right side 5.2cm, causes a lot of pain for her -s/p bilateral salpingectomy and right oophorectomy on 02/25/22 with Dr. Polly Cobia. Pathology showed ovary with moderately differentiated adenocarcinoma. Pelvic fluid, b/l fallopian tubes, and endometrium curettage were all benign.   3.  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 More so in her feet, but she does note some functional deficits in her fingers.   4. Mild thrombocytopenia -Secondary to chemotherapy -overall stable, 129k on 03/12/22   5. Genetics  -Genetics consult on 12/04/20, genetic test was drawn 01/17/21 -Results were negative     Plan: -Recent surgical pathology, and PET scan reviewed with  patient. -We will start CapeOx with dose reduction in 2 weeks.   No problem-specific Assessment & Plan notes found for this  encounter.   SUMMARY OF ONCOLOGIC HISTORY: Oncology History Overview Note  Cancer Staging Malignant neoplasm of rectosigmoid junction [facility]) Staging form: Colon and Rectum, AJCC 8th Edition - Pathologic stage from 04/04/2020: pT4a, pN1b, cM1 - Signed by [name], NP on 05/07/2020    Malignant neoplasm of rectosigmoid junction ([place])  11/10/2019 Imaging   MRI Abdomen  IMPRESSION: 1. Redemonstrated hypoenhancing lesion of the posterior liver dome, hepatic segment VII, reduced in size compared to prior examination, measuring 1.3 x 1.3 cm, previously 1.8 x 1.7 cm. Findings are consistent with treatment response of a biopsy proven metastasis. No other evidence of lymphadenopathy or metastatic disease within the abdomen or pelvis. 2. Unchanged mild splenomegaly, maximum coronal span 14.0 cm. 3. 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   03/12/2020 Initial Diagnosis   Malignant neoplasm of rectosigmoid junction ([place])   03/12/2020 Imaging   CT AP with contrast IMPRESSION: 1. Overall findings are highly concerning for colorectal carcinoma involving the sigmoid colon with an associated perforation and adjacent abscess and phlegmon formation as detailed above. Currently, no collection is amenable to percutaneous drainage given their small size and location. 2. New 2 cm mass in the right hepatic lobe concerning for metastatic disease to the liver until proven otherwise. 3. Enlarged regional lymph nodes as detailed above is concerning for nodal metastatic disease. 4. Large stool burden. 5. Prominent pelvic veins which can be seen in patients with pelvic congestion syndrome.   03/13/2020 Imaging   ABD US IMPRESSION: Approximately 2.1 x 2.4 x 2.0 cm lobular homogeneously echogenic lesion in the right hepatic dome corresponds with the abnormality seen on the prior CT scan. Sonographically, this appearance is highly suggestive of a benign hemangioma.    Recommend MRI of the abdomen with gadolinium contrast which may provide a noninvasive diagnosis of benign hemangioma.    03/13/2020 Imaging   MR ABD W/WO CONTRAST Hepatobiliary: Diffuse low signal intensity throughout the hepatic parenchyma on T2 weighted images, presumably a consequence of recent Feraheme injection. In segment 7 of the liver (axial image 8 of series 5) there is a 2.5 x 1.9 cm well-defined lesion which is slightly T2 hyperintense. This lesion appears hyperintense on pre gadolinium T1 weighted images (likely a consequence of Feraheme). Interpretation of enhancement within the lesion is compromised by presence of Feraheme. No other hepatic lesions are confidently identified on today's examination. No intra or extrahepatic biliary ductal dilatation. Gallbladder is normal in appearance.   03/31/2020 Imaging   CT AP W contrast IMPRESSION: 1. Previously noted sigmoid colon mass appears increased in size, and again appears to be associated with a focal contained perforation which crosses the midline and has fistulized into the left adnexal region where there is now what appears to be a large left tubo-ovarian abscess, as detailed above. This is also associated with multiple enlarged lymph nodes in the pelvis measuring up to 1.2 cm in short axis and borderline enlarged retroperitoneal lymph nodes, concerning for metastatic disease. In addition, previously suspected metastatic lesion in segment 7 of the liver has enlarged. 2. Small volume of ascites. 3. Additional incidental findings, as above.   04/04/2020 Cancer Staging   Staging form: Colon and Rectum, AJCC 8th Edition - Pathologic stage from 04/04/2020: pT4a, pN1b, cM1 - Signed by [name], NP on 05/07/2020   04/04/2020 Procedure   Paracentesis,  path showed no malignant cells (mixed acute and chronic inflammation present)   04/04/2020 Surgery   Open sigmoid colectomy and end colostomy by Dr. [name]   04/04/2020  Pathology Results   FINAL MICROSCOPIC DIAGNOSIS: A. COLON, RECTOSIGMOID, RESECTION: - Invasive moderately differentiated adenocarcinoma, 6 cm, involving rectosigmoid junction - Carcinoma invades into serosal surface with perforation and associated serositis - Radial resection margin is positive for carcinoma; proximal and distal margins are not involved - Lymphovascular invasion is present - Metastatic carcinoma to one of fifteen lymph nodes (1/15); one tumor deposit - See oncology table B. LYMPH NODES, MESENTERIC, RESECTION: - Metastatic adenocarcinoma to one of six lymph nodes (1/6) - One tumor deposit  Addendum to note 2 involved lymph nodes (of 21 examined nodes) pT4a,pN1b MMR-normal, preserved expression of MLH1, MSH2, MSH6, PMS2   04/12/2020 Procedure   PAC placement    04/13/2020 Imaging   CT chest without contrast IMPRESSION: Interval development of bilateral pleural effusions, left slightly greater than right, with resultant bibasilar atelectasis including subtotal collapse of the left lower lobe. No evidence of intrathoracic metastatic disease, though evaluation of the collapsed parenchyma is limited. Hepatic metastasis again demonstrated.     04/16/2020 Pathology Results   FINAL MICROSCOPIC DIAGNOSIS:  A. LIVER, RIGHT LOBE, BIOPSY:  - Adenocarcinoma.  COMMENT:  The morphology is compatible with the provided clinical history of colorectal carcinoma.    05/23/2020 - 10/24/2020 Chemotherapy   FOLFIRINOX q2weeks for 3-6 months starting 05/23/20. Bevacizumab-bvzr [name] added with C2. Oxaliplatin held with C11-12 due to reaction. (pt developed SOB, chest palpitation and abdominal discomfort shortly after oxaliplatin started). Completed on 10/24/20.   08/05/2020 Imaging   CT AP  IMPRESSION: 1. Postsurgical changes of distal colectomy with a left lower quadrant end ostomy. No evidence of obstruction or acute complication at this time. Excluded rectal pouch in the deep  pelvis without acute complication or worrisome features. 2. Slight interval decrease in size of a hypoattenuating lesion posterior right lobe liver measuring 1.7 x 1.8 x 2 cm. This lesion has previously undergone ultrasound-guided biopsy with pathologic results demonstrating adenocarcinoma compatible with metastatic disease from patient's resected colorectal carcinoma. 3. Slight prominence of the parametrial vessels bilaterally, nonspecific though can be seen in the setting of pelvic congestion syndrome. 4. Mild splenomegaly.  No focal lesion.     11/12/2020 Imaging   CT Chest  IMPRESSION: 1. No evidence of metastatic disease in the chest. 2. 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   12/13/2020 Surgery   A. 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 The report date is 01/28/2021.   The CustomNext-Cancer+RNAinsight panel offered by [facility] included sequencing and rearrangement analysis for the following 47 genes:  APC, ATM, AXIN2, BARD1, BMPR1A, BRCA1, BRCA2, BRIP1, CDH1, CDK4, CDKN2A, CHEK2, DICER1, EPCAM, GREM1, HOXB13, MEN1, MLH1, MSH2, MSH3, MSH6, MUTYH, NBN, NF1, NF2, NTHL1, PALB2, PMS2, POLD1, POLE, PTEN, RAD51C, RAD51D, RECQL, RET, SDHA, SDHAF2, SDHB, SDHC, SDHD, SMAD4, SMARCA4, STK11, TP53, TSC1, TSC2, and VHL.  RNA data is routinely analyzed for use in variant interpretation for all genes.   02/19/2021 Imaging  CT A/P w/o contrast  IMPRESSION: Slightly limited examination examination in absence of contrast administration. Status  post partial right hepatectomy. Interval decrease in size in perihepatic fluid collection and resolution of right subdiaphragmatic fluid and gas. No new intra-abdominal fluid collections are identified.   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 Moderate stool throughout the colon without evidence of obstruction.   Fluid distension of the proximal duodenum to the level of the SMA hiatus which appears narrow. 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   05/05/2021 Imaging   CT AP  IMPRESSION: Postsurgical changes as described stable in appearance from the prior exam.   Changes suggestive of mild pelvic varices.   08/22/2021 Imaging   EXAM: CT ABDOMEN AND PELVIS WITH CONTRAST  IMPRESSION: 1. Extensive bowel content is identified throughout the colon consistent with constipation. 2. 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 3. Stable postoperative changes in the right lobe liver.   09/12/2021 Survivorship   SCP delivered by Cira Rue, NP   10/05/2021 Imaging   EXAM: CT ABDOMEN AND PELVIS WITH CONTRAST  IMPRESSION: 1. Right ovarian/adnexal cyst, 5.2 cm. Because this lesion is not adequately characterized, prompt [place] is recommended for further evaluation. Note: This recommendation does not apply to premenarchal patients and to those with increased risk (genetic, family history, elevated tumor markers or other high-risk factors) of ovarian cancer. Reference: JACR 2020 Feb; 17(2):248-254 2. No other evidence of an acute abnormality within the abdomen or pelvis. 3. Moderate increase in the colonic and rectal stool burden. No bowel obstruction or inflammation. 4. No evidence of locally recurrent or metastatic colon carcinoma.   11/12/2021 Imaging   EXAM: CT CHEST WITHOUT  CONTRAST  IMPRESSION: 1. No evidence of pulmonary metastatic disease. 2. Stable postoperative findings about the posterior right lobe of the liver.      INTERVAL HISTORY:  Stephanie [name] is here for a follow up of metastatic colon cancer. She was last seen by me on 11/15/21. She presents to the clinic alone. She reports she is still having back pain. She notes the ovarian surgery helped the pain on the right, but she continues to have pain on the left.   All other systems were reviewed with the patient and are negative.  MEDICAL HISTORY:  Past Medical History:  Diagnosis Date   Anxiety    Blood transfusion without reported diagnosis    Bowel obstruction (HCC)    BV (bacterial vaginosis)    Colon cancer ([place])    Colon Cancer 03-2020   Colon polyps    Colostomy present [facility])    03-2020   Family history of bladder cancer    History of chemotherapy    ended 09-2020   Lactose intolerance 03/12/2020   Neuromuscular disorder (HCC)    neuropathy feet hands legs   UTI (lower urinary tract infection)     SURGICAL HISTORY: Past Surgical History:  Procedure Laterality Date   CESAREAN SECTION     CHOLECYSTECTOMY  12/13/2020   COLECTOMY  03/2020   COLONOSCOPY     COLOSTOMY TAKEDOWN N/A 05/30/2021   Procedure: LAPAROSCOPIC ASSISTED COLOSTOMY TAKEDOWN;  Surgeon: [name], MD;  Location: [facility];  Service: General;  Laterality: N/A;   CYSTOSCOPY WITH STENT PLACEMENT  04/04/2020   Procedure: CYSTOSCOPY WITH STENT PLACEMENT;  Surgeon: [name], MD;  Location: [facility];  Service: General;;  LAPAROSCOPIC LIVER ULTRASOUND N/A 12/13/2020   Procedure: INTRAOPERATIVE LIVER ULTRASOUND;  Surgeon: [name], MD;  Location: [facility];  Service: General;  Laterality: N/A;   LAPAROSCOPY N/A 12/13/2020   Procedure: LAPAROSCOPY DIAGNOSTIC;  Surgeon: [name], MD;  Location: [facility];  Service: General;  Laterality: N/A;   LAPAROTOMY N/A 04/04/2020   Procedure: EXPLORATORY LAPAROTOMY;   Surgeon: [name], MD;  Location: [facility];  Service: General;  Laterality: N/A;   OPEN PARTIAL HEPATECTOMY  N/A 12/13/2020   Procedure: OPEN PARTIAL HEPATECTOMY;  Surgeon: [name], MD;  Location: [facility];  Service: General;  Laterality: N/A;  ROOM 2 STARTING AT 09:30AM FOR 300 MIN   PORTACATH PLACEMENT Right 04/12/2020   Procedure: INSERTION PORT-A-CATH WITH ULTRASOUND;  Surgeon: Kieth Brightly Arta Bruce, MD;  Location: [facility];  Service: General;  Laterality: Right;    I have reviewed the social history and family history with the patient and they are unchanged from previous note.  ALLERGIES:  is allergic to oxaliplatin.  MEDICATIONS:  Current Outpatient Medications  Medication Sig Dispense Refill   ondansetron (ZOFRAN) 8 MG tablet Take 1 tablet (8 mg total) by mouth every 8 (eight) hours as needed for nausea or vomiting. 30 tablet 0   acetaminophen (TYLENOL) 325 MG tablet Take 2 tablets (650 mg total) by mouth every 6 (six) hours as needed for mild pain, moderate pain, fever or headache (fever > 101).     AUROVELA 1.5/30 1.5-30 MG-MCG tablet TAKE ONE TABLET BY MOUTH DAILY 21 tablet 6   HYDROcodone-acetaminophen (NORCO/VICODIN) 5-325 MG tablet Take 1 tablet by mouth every 6 (six) hours as needed for severe pain. 8 tablet 0   ibuprofen (ADVIL) 600 MG tablet Take 1 tablet (600 mg total) by mouth every 6 (six) hours as needed for headache, mild pain, moderate pain or cramping. 30 tablet 2   Lactic Ac-Citric Ac-Pot Bitart (PHEXXI) 1.8-1-0.4 % GEL Place 5 g vaginally as needed. 60 g 1   meloxicam (MOBIC) 7.5 MG tablet Take 1 tablet (7.5 mg total) by mouth daily. 20 tablet 0   Prenatal Vit-Fe Fumarate-FA (PRENATAL MULTIVITAMIN) TABS tablet Take 1 tablet by mouth daily at 12 noon.     sulfamethoxazole-trimethoprim (BACTRIM DS) 800-160 MG tablet Take 1 tablet by mouth 2 (two) times daily.     SUTAB [phone] MG TABS Take by mouth.     topiramate (TOPAMAX) 50 MG tablet Take 50 mg by mouth  daily.     traMADol (ULTRAM) 50 MG tablet Take 50 mg by mouth every 6 (six) hours as needed.     No current facility-administered medications for this visit.    PHYSICAL EXAMINATION: ECOG PERFORMANCE STATUS: 1 - Symptomatic but completely ambulatory  Vitals:   03/14/22 1623  BP: 118/82  Pulse: 84  Resp: 18  Temp: 98.5 F (36.9 C)  SpO2: 100%   Wt Readings from Last 3 Encounters:  03/14/22 142 lb 14.4 oz (64.8 kg)  01/24/22 148 lb (67.1 kg)  11/15/21 148 lb 3 oz (67.2 kg)     GENERAL:alert, no distress and comfortable SKIN: skin color, texture, turgor are normal, no rashes or significant lesions EYES: normal, Conjunctiva are pink and non-injected, sclera clear  NECK: supple, thyroid normal size, non-tender, without nodularity LYMPH:  no palpable lymphadenopathy in the cervical, axillary LUNGS: clear to auscultation and percussion with normal breathing effort HEART: regular rate & rhythm and no murmurs and no lower extremity edema ABDOMEN:abdomen soft, non-tender and normal bowel sounds Musculoskeletal:no cyanosis of  digits and no clubbing  NEURO: alert & oriented x 3 with fluent speech, no focal motor/sensory deficits  LABORATORY DATA:  I have reviewed the data as listed    Latest Ref Rng & Units 03/12/2022   11:21 AM 01/22/2022    7:12 PM 01/13/2022    9:11 AM  CBC  WBC 4.0 - 10.5 K/uL 7.3  5.4  4.4   Hemoglobin 12.0 - 15.0 g/dL 15.0  13.6  14.5   Hematocrit 36.0 - 46.0 % 42.9  40.7  41.6   Platelets 150 - 400 K/uL 129  127  105         Latest Ref Rng & Units 03/12/2022   11:21 AM 01/22/2022    7:12 PM 01/13/2022    9:11 AM  CMP  Glucose 70 - 99 mg/dL 111  98  102   BUN 6 - 20 mg/dL _0 Creatinine 0.44 - 1.00 mg/dL 0.69  0.66  0.75   Sodium 135 - 145 mmol/L 138  140  138   Potassium 3.5 - 5.1 mmol/L 3.8  3.9  3.6   Chloride 98 - 111 mmol/L 106  109  107   CO2 22 - 32 mmol/L _1 Calcium 8.9 - 10.3 mg/dL 9.7  8.6  8.7   Total Protein 6.5 - 8.1  g/dL 7.3  6.2  6.6   Total Bilirubin 0.3 - 1.2 mg/dL 0.5  0.6  0.3   Alkaline Phos 38 - 126 U/L 58  36  38   AST 15 - 41 U/L _2 ALT 0 - 44 U/L _3 RADIOGRAPHIC STUDIES: I have personally reviewed the radiological images as listed and agreed with the findings in the report. No results found.    No orders of the defined types were placed in this encounter.  All questions were answered. The patient knows to call the clinic with any problems, questions or concerns. No barriers to learning was detected. The total time spent in the appointment was 60 minutes.     [name], MD 03/14/2022   I, Wilburn Mylar, am acting as scribe for [name], MD.   I have reviewed the above documentation for accuracy and completeness, and I agree with the above.

## 2022-03-17 ENCOUNTER — Encounter: Payer: Self-pay | Admitting: Hematology

## 2022-03-17 ENCOUNTER — Telehealth: Payer: Self-pay | Admitting: Pharmacy Technician

## 2022-03-17 ENCOUNTER — Inpatient Hospital Stay: Payer: BC Managed Care – PPO

## 2022-03-17 ENCOUNTER — Telehealth: Payer: Self-pay | Admitting: Pharmacist

## 2022-03-17 ENCOUNTER — Telehealth: Payer: Self-pay | Admitting: Hematology

## 2022-03-17 ENCOUNTER — Other Ambulatory Visit (HOSPITAL_COMMUNITY): Payer: Self-pay

## 2022-03-17 DIAGNOSIS — C19 Malignant neoplasm of rectosigmoid junction: Secondary | ICD-10-CM

## 2022-03-17 MED ORDER — CAPECITABINE 500 MG PO TABS
850.0000 mg/m2 | ORAL_TABLET | Freq: Two times a day (BID) | ORAL | 1 refills | Status: DC
  Filled 2022-03-17: qty 84, 14d supply, fill #0
  Filled 2022-03-18: qty 84, 21d supply, fill #0
  Filled 2022-04-10: qty 84, 21d supply, fill #1

## 2022-03-17 NOTE — Telephone Encounter (Signed)
Oral Oncology Patient Advocate Encounter  After completing a benefits investigation, prior authorization for Capecitabine (Xeloda)  is not required at this time through [facility] and [place] Medicaid.  Patient's copay is $0.     Lady Deutscher, CPhT-Adv Pharmacy Patient Advocate Specialist [place] Patient Advocate Team Direct Number: [phone]  Fax: [phone] on

## 2022-03-17 NOTE — Telephone Encounter (Signed)
Scheduled follow-up appointments per 6/16 los. Patient is aware.

## 2022-03-17 NOTE — Telephone Encounter (Signed)
Oral Oncology Pharmacist Encounter  Received new prescription for Xeloda (capecitabine) for the treatment of metastatic colon cancer in conjunction with oxaliplatin, planned duration of CAPEOX 3 months, followed by 3 months of Xeloda monotherapy per MD note.  CBC w/ Diff and CMP from 03/12/22 assessed, no relevant lab abnormalities noted. Prescription dose and frequency assessed for appropriateness.   Current medication list in Epic reviewed, no relevant/significant DDIs with Xeloda identified.  Evaluated chart and no patient barriers to medication adherence noted.   Patient agreement for treatment documented in MD note on 03/14/22.  Prescription has been e-scribed to the [facility] for benefits analysis and approval.  Oral Oncology Clinic will continue to follow for insurance authorization, copayment issues, initial counseling and start date.  Leron Croak, PharmD, BCPS Hematology/Oncology Clinical Pharmacist [facility] and [place] [phone] 03/17/2022 8:29 AM

## 2022-03-18 ENCOUNTER — Encounter: Payer: Self-pay | Admitting: Pharmacist

## 2022-03-18 ENCOUNTER — Other Ambulatory Visit (HOSPITAL_COMMUNITY): Payer: Self-pay

## 2022-03-18 NOTE — Telephone Encounter (Signed)
Oral Chemotherapy Pharmacist Encounter  I spoke with patient for overview of: Xeloda (capecitabine) for the treatment of metastatic colon cancer in conjunction with oxaliplatin, planned duration of CAPEOX 3 months, followed by 3 months of Xeloda monotherapy per MD note.  Counseled patient on administration, dosing, side effects, monitoring, drug-food interactions, safe handling, storage, and disposal.  Patient will take Xeloda '500mg'$  tablets, 3 tablets ('1500mg'$ ) by mouth in AM and 3 tabs ('1500mg'$ ) by mouth in PM, within 30 minutes of finishing meals, on days 1-14 of each 21 day cycle.   Oxaliplatin will be infused on day 1 of each 21 day cycle.  Xeloda and oxaliplatin start date: 03/28/22  Adverse effects include but are not limited to: fatigue, decreased blood counts, GI upset, diarrhea, mouth sores, and hand-foot syndrome.  Patient has anti-emetic on hand and knows to take it if nausea develops.   Patient will obtain anti diarrheal and alert the office of 4 or more loose stools above baseline.  Reviewed with patient importance of keeping a medication schedule and plan for any missed doses. No barriers to medication adherence identified.  Medication reconciliation performed and medication/allergy list updated.  Insurance authorization for Xeloda has been obtained. Test claim at the pharmacy revealed copayment $4 for 1st fill of Xeloda. Patient will pick this up from the [facility] on 03/18/22.  Patient informed the pharmacy will reach out 5-7 days prior to needing next fill of Xeloda to coordinate continued medication acquisition to prevent break in therapy.  All questions answered.  [name] voiced understanding and appreciation.   Medication education handout and medication calendar placed in mail for patient. Patient knows to call the office with questions or concerns. Oral Chemotherapy Clinic phone number provided to patient.   Stephanie [name], PharmD,  BCPS Hematology/Oncology Clinical Pharmacist [facility] [place] [phone] 03/18/2022 12:57 PM

## 2022-03-26 ENCOUNTER — Other Ambulatory Visit (HOSPITAL_COMMUNITY): Payer: Self-pay

## 2022-03-27 NOTE — Progress Notes (Unsigned)
[place] [address]  DIAGNOSIS: f/u of metastatic colon cancer  Oncology History Overview Note  Cancer Staging Malignant neoplasm of rectosigmoid junction [facility]) Staging form: Colon and Rectum, AJCC 8th Edition - Pathologic stage from 04/04/2020: pT4a, pN1b, cM1 - Signed by [name], NP on 05/07/2020    Malignant neoplasm of rectosigmoid junction ([place])  11/10/2019 Imaging   MRI Abdomen  IMPRESSION: 1. Redemonstrated hypoenhancing lesion of the posterior liver dome, hepatic segment VII, reduced in size compared to prior examination, measuring 1.3 x 1.3 cm, previously 1.8 x 1.7 cm. Findings are consistent with treatment response of a biopsy proven metastasis. No other evidence of lymphadenopathy or metastatic disease within the abdomen or pelvis. 2. Unchanged mild splenomegaly, maximum coronal span 14.0 cm. 3. 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   03/12/2020 Initial Diagnosis   Malignant neoplasm of rectosigmoid junction ([place])   03/12/2020 Imaging   CT AP with contrast IMPRESSION: 1. Overall findings are highly concerning for colorectal carcinoma involving the sigmoid colon with an associated perforation and adjacent abscess and phlegmon formation as detailed above. Currently, no collection is amenable to percutaneous drainage given their small size and location. 2. New 2 cm mass in the right hepatic lobe concerning for metastatic disease to the liver until proven otherwise. 3. Enlarged regional lymph nodes as detailed above is concerning for nodal metastatic disease. 4. Large stool burden. 5. Prominent pelvic veins which can be seen in patients with pelvic congestion syndrome.   03/13/2020 Imaging   ABD US IMPRESSION: Approximately 2.1 x 2.4 x 2.0 cm lobular homogeneously echogenic lesion in the right hepatic dome corresponds with the  abnormality seen on the prior CT scan. Sonographically, this appearance is highly suggestive of a benign hemangioma.   Recommend MRI of the abdomen with gadolinium contrast which may provide a noninvasive diagnosis of benign hemangioma.    03/13/2020 Imaging   MR ABD W/WO CONTRAST Hepatobiliary: Diffuse low signal intensity throughout the hepatic parenchyma on T2 weighted images, presumably a consequence of recent Feraheme injection. In segment 7 of the liver (axial image 8 of series 5) there is a 2.5 x 1.9 cm well-defined lesion which is slightly T2 hyperintense. This lesion appears hyperintense on pre gadolinium T1 weighted images (likely a consequence of Feraheme). Interpretation of enhancement within the lesion is compromised by presence of Feraheme. No other hepatic lesions are confidently identified on today's examination. No intra or extrahepatic biliary ductal dilatation. Gallbladder is normal in appearance.   03/31/2020 Imaging   CT AP W contrast IMPRESSION: 1. Previously noted sigmoid colon mass appears increased in size, and again appears to be associated with a focal contained perforation which crosses the midline and has fistulized into the left adnexal region where there is now what appears to be a large left tubo-ovarian abscess, as detailed above. This is also associated with multiple enlarged lymph nodes in the pelvis measuring up to 1.2 cm in short axis and borderline enlarged retroperitoneal lymph nodes, concerning for metastatic disease. In addition, previously suspected metastatic lesion in segment 7 of the liver has enlarged. 2. Small volume of ascites. 3. Additional incidental findings, as above.   04/04/2020 Cancer Staging   Staging form: Colon and Rectum, AJCC 8th Edition - Pathologic stage from 04/04/2020: pT4a, pN1b, cM1 - Signed by [name], NP on 05/07/2020   04/04/2020 Procedure   Paracentesis, path showed no malignant  cells (mixed acute and chronic  inflammation present)   04/04/2020 Surgery   Open sigmoid colectomy and end colostomy by Dr. [name]   04/04/2020 Pathology Results   FINAL MICROSCOPIC DIAGNOSIS: A. COLON, RECTOSIGMOID, RESECTION: - Invasive moderately differentiated adenocarcinoma, 6 cm, involving rectosigmoid junction - Carcinoma invades into serosal surface with perforation and associated serositis - Radial resection margin is positive for carcinoma; proximal and distal margins are not involved - Lymphovascular invasion is present - Metastatic carcinoma to one of fifteen lymph nodes (1/15); one tumor deposit - See oncology table B. LYMPH NODES, MESENTERIC, RESECTION: - Metastatic adenocarcinoma to one of six lymph nodes (1/6) - One tumor deposit  Addendum to note 2 involved lymph nodes (of 21 examined nodes) pT4a,pN1b MMR-normal, preserved expression of MLH1, MSH2, MSH6, PMS2   04/12/2020 Procedure   PAC placement    04/13/2020 Imaging   CT chest without contrast IMPRESSION: Interval development of bilateral pleural effusions, left slightly greater than right, with resultant bibasilar atelectasis including subtotal collapse of the left lower lobe. No evidence of intrathoracic metastatic disease, though evaluation of the collapsed parenchyma is limited. Hepatic metastasis again demonstrated.     04/16/2020 Pathology Results   FINAL MICROSCOPIC DIAGNOSIS:  A. LIVER, RIGHT LOBE, BIOPSY:  - Adenocarcinoma.  COMMENT:  The morphology is compatible with the provided clinical history of colorectal carcinoma.    05/23/2020 - 10/24/2020 Chemotherapy   FOLFIRINOX q2weeks for 3-6 months starting 05/23/20. Bevacizumab-bvzr [name] added with C2. Oxaliplatin held with C11-12 due to reaction. (pt developed SOB, chest palpitation and abdominal discomfort shortly after oxaliplatin started). Completed on 10/24/20.   08/05/2020 Imaging   CT AP  IMPRESSION: 1. Postsurgical changes of distal colectomy with a left  lower quadrant end ostomy. No evidence of obstruction or acute complication at this time. Excluded rectal pouch in the deep pelvis without acute complication or worrisome features. 2. Slight interval decrease in size of a hypoattenuating lesion posterior right lobe liver measuring 1.7 x 1.8 x 2 cm. This lesion has previously undergone ultrasound-guided biopsy with pathologic results demonstrating adenocarcinoma compatible with metastatic disease from patient's resected colorectal carcinoma. 3. Slight prominence of the parametrial vessels bilaterally, nonspecific though can be seen in the setting of pelvic congestion syndrome. 4. Mild splenomegaly.  No focal lesion.     11/12/2020 Imaging   CT Chest  IMPRESSION: 1. No evidence of metastatic disease in the chest. 2. 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   12/13/2020 Surgery   A. 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 The report date is 01/28/2021.   The CustomNext-Cancer+RNAinsight panel offered by [facility] included sequencing and rearrangement analysis for the following 47 genes:  APC, ATM, AXIN2, BARD1, BMPR1A, BRCA1, BRCA2, BRIP1, CDH1, CDK4, CDKN2A, CHEK2, DICER1, EPCAM, GREM1, HOXB13, MEN1, MLH1, MSH2, MSH3, MSH6, MUTYH, NBN, NF1, NF2, NTHL1, PALB2, PMS2, POLD1, POLE, PTEN, RAD51C, RAD51D, RECQL, RET, SDHA, SDHAF2, SDHB, SDHC, SDHD, SMAD4, SMARCA4, STK11, TP53, TSC1, TSC2, and VHL.  RNA data is routinely analyzed for use in variant interpretation for all genes.   02/19/2021 Imaging    CT A/P w/o  contrast  IMPRESSION: Slightly limited examination examination in absence of contrast administration. Status post partial right hepatectomy. Interval decrease in size in perihepatic fluid collection and resolution of right subdiaphragmatic fluid and gas. No new intra-abdominal fluid collections are identified.   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 Moderate stool throughout the colon without evidence of obstruction.   Fluid distension of the proximal duodenum to the level of the SMA hiatus which appears narrow. 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   05/05/2021 Imaging   CT AP  IMPRESSION: Postsurgical changes as described stable in appearance from the prior exam.   Changes suggestive of mild pelvic varices.   08/22/2021 Imaging   EXAM: CT ABDOMEN AND PELVIS WITH CONTRAST  IMPRESSION: 1. Extensive bowel content is identified throughout the colon consistent with constipation. 2. 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 3. Stable postoperative changes in the right lobe liver.   09/12/2021 Survivorship   SCP delivered by Cira Rue, NP   10/05/2021 Imaging   EXAM: CT ABDOMEN AND PELVIS WITH CONTRAST  IMPRESSION: 1. Right ovarian/adnexal cyst, 5.2 cm. Because this lesion is not adequately characterized, prompt [place] is recommended for further evaluation. Note: This recommendation does not apply to premenarchal patients and to those with increased risk (genetic, family history, elevated tumor markers or other high-risk factors) of ovarian cancer. Reference: JACR 2020 Feb; 17(2):248-254 2. No other evidence of an acute abnormality within the abdomen or pelvis. 3. Moderate increase in the colonic and rectal stool burden. No bowel obstruction or inflammation. 4.  No evidence of locally recurrent or metastatic colon carcinoma.   11/12/2021 Imaging   EXAM: CT CHEST WITHOUT CONTRAST  IMPRESSION: 1. No evidence of pulmonary metastatic disease. 2. Stable postoperative findings about the posterior right lobe of the liver.   03/28/2022 -  Chemotherapy   Patient is on Treatment Plan : COLORECTAL Xelox (Capeox) q21d       CURRENT THERAPY: CAPOX, first dose today. Oxaliplatin dose reduced due to prior infusion reaction with additional premedications. Dr. Burr Medico recommends run at slower rate.   INTERVAL HISTORY: Stephanie [name] 41 y.o. female returns to the clinic today for a follow-up visit accompanied by her daughter. The patient is, understandably, very nervous about starting treatment today. The patient is scheduled to start CAPOX today. She previously had a reaction to oxaliplatin. Dr. Burr Medico has added several premedications and the infusion is going to be run slower. She took ativan this morning with 0.5 and is wondering if she can take another one. She also is concerned because she reports abdominal pain and cramping with prior chemotherapy. She is wondering if she can have a prescription for tramadol which was prescribed in the past. Unclear if her pain was cancer related pain, surgical pain, constipation, etc. She attributes the pain to her prior chemotherapy. She was prescribed norco from her surgery last month but is out of this at this time. She denies abdominal pain at this time.   She has her prescription of Xeloda. She took her first dose this morning. Overall, the patient denies any new concerning complaints today besides being nervous.  Denies any fever, chills, night sweats, unexplained weight loss, or abdominal pain.  Denies any nausea, vomiting, diarrhea, or constipation.  Denies any melena.  Denies any jaundice or itching.  She denies any abnormal bleeding or bruising.  She is  here today for evaluation and repeat blood work before undergoing  cycle #1 of CAPOX.    MEDICAL HISTORY: Past Medical History:  Diagnosis Date   Anxiety    Blood transfusion without reported diagnosis    Bowel obstruction (HCC)    BV (bacterial vaginosis)    Colon cancer [facility])    Colon Cancer 03-2020   Colon polyps    Colostomy present ([place])    03-2020   Family history of bladder cancer    History of chemotherapy    ended 09-2020   Lactose intolerance 03/12/2020   Neuromuscular disorder (HCC)    neuropathy feet hands legs   UTI (lower urinary tract infection)     ALLERGIES:  is allergic to oxaliplatin.  MEDICATIONS:  Current Outpatient Medications  Medication Sig Dispense Refill   acetaminophen (TYLENOL) 325 MG tablet Take 2 tablets (650 mg total) by mouth every 6 (six) hours as needed for mild pain, moderate pain, fever or headache (fever > 101).     AUROVELA 1.5/30 1.5-30 MG-MCG tablet TAKE ONE TABLET BY MOUTH DAILY 21 tablet 6   capecitabine (XELODA) 500 MG tablet Take 3 tablets (1,500 mg total) by mouth 2 (two) times daily after a meal. Take for 14 days on, then off 7 days. Repeat every 21 days. 84 tablet 1   HYDROcodone-acetaminophen (NORCO/VICODIN) 5-325 MG tablet Take 1 tablet by mouth every 6 (six) hours as needed for severe pain. 8 tablet 0   ibuprofen (ADVIL) 600 MG tablet Take 1 tablet (600 mg total) by mouth every 6 (six) hours as needed for headache, mild pain, moderate pain or cramping. 30 tablet 2   Lactic Ac-Citric Ac-Pot Bitart (PHEXXI) 1.8-1-0.4 % GEL Place 5 g vaginally as needed. 60 g 1   lidocaine-prilocaine (EMLA) cream Apply to affected area once 30 g 3   meloxicam (MOBIC) 7.5 MG tablet Take 1 tablet (7.5 mg total) by mouth daily. 20 tablet 0   ondansetron (ZOFRAN) 8 MG tablet Take 1 tablet (8 mg total) by mouth every 8 (eight) hours as needed for nausea or vomiting. 30 tablet 0   Prenatal Vit-Fe Fumarate-FA (PRENATAL MULTIVITAMIN) TABS tablet Take 1 tablet by mouth daily at 12 noon.     prochlorperazine (COMPAZINE) 10 MG  tablet Take 1 tablet (10 mg total) by mouth every 6 (six) hours as needed (Nausea or vomiting). 30 tablet 1   sulfamethoxazole-trimethoprim (BACTRIM DS) 800-160 MG tablet Take 1 tablet by mouth 2 (two) times daily.     SUTAB [phone] MG TABS Take by mouth.     topiramate (TOPAMAX) 50 MG tablet Take 50 mg by mouth daily.     traMADol (ULTRAM) 50 MG tablet Take 1 tablet (50 mg total) by mouth every 6 (six) hours as needed. 15 tablet 0   No current facility-administered medications for this visit.   Facility-Administered Medications Ordered in Other Visits  Medication Dose Route Frequency Provider Last Rate Last Admin   famotidine (PEPCID) IVPB 20 mg premix  20 mg Intravenous Once [name], MD       heparin lock flush 100 unit/mL  500 Units Intracatheter Once PRN [name], MD       methylPREDNISolone sodium succinate (SOLU-MEDROL) 125 mg/2 mL injection 125 mg  125 mg Intravenous Daily [name], MD       oxaliplatin (ELOXATIN) 130 mg in dextrose 5 % 500 mL chemo infusion  75 mg/m2 (Treatment Plan Recorded) Intravenous Once [name], MD  sodium chloride flush (NS) 0.9 % injection 10 mL  10 mL Intracatheter PRN [name], MD        SURGICAL HISTORY:  Past Surgical History:  Procedure Laterality Date   CESAREAN SECTION     CHOLECYSTECTOMY  12/13/2020   COLECTOMY  03/2020   COLONOSCOPY     COLOSTOMY TAKEDOWN N/A 05/30/2021   Procedure: LAPAROSCOPIC ASSISTED COLOSTOMY TAKEDOWN;  Surgeon: [name], MD;  Location: [place] [facility];  Service: General;  Laterality: N/A;   CYSTOSCOPY WITH STENT PLACEMENT  04/04/2020   Procedure: CYSTOSCOPY WITH STENT PLACEMENT;  Surgeon: [name], MD;  Location: [facility];  Service: General;;   LAPAROSCOPIC LIVER ULTRASOUND N/A 12/13/2020   Procedure: INTRAOPERATIVE LIVER ULTRASOUND;  Surgeon: [name], MD;  Location: [place];  Service: General;  Laterality: N/A;   LAPAROSCOPY N/A 12/13/2020   Procedure: LAPAROSCOPY DIAGNOSTIC;  Surgeon: [name], MD;   Location: [place];  Service: General;  Laterality: N/A;   LAPAROTOMY N/A 04/04/2020   Procedure: EXPLORATORY LAPAROTOMY;  Surgeon: [name], MD;  Location: [facility];  Service: General;  Laterality: N/A;   OPEN PARTIAL HEPATECTOMY  N/A 12/13/2020   Procedure: OPEN PARTIAL HEPATECTOMY;  Surgeon: [name], MD;  Location: [place];  Service: General;  Laterality: N/A;  ROOM 2 STARTING AT 09:30AM FOR 300 MIN   PORTACATH PLACEMENT Right 04/12/2020   Procedure: INSERTION PORT-A-CATH WITH ULTRASOUND;  Surgeon: Kieth Brightly Arta Bruce, MD;  Location: [facility];  Service: General;  Laterality: Right;    REVIEW OF SYSTEMS:   Review of Systems  Constitutional: Negative for appetite change, chills, fatigue, fever and unexpected weight change.  HENT: Negative for mouth sores, nosebleeds, sore throat and trouble swallowing.   Eyes: Negative for eye problems and icterus.  Respiratory: Negative for cough, hemoptysis, shortness of breath and wheezing.   Cardiovascular: Negative for chest pain and leg swelling.  Gastrointestinal: Negative for abdominal pain, constipation, diarrhea, nausea and vomiting.  Genitourinary: Negative for bladder incontinence, difficulty urinating, dysuria, frequency and hematuria.   Musculoskeletal: Negative for back pain, gait problem, neck pain and neck stiffness.  Skin: Negative for itching and rash.  Neurological: Negative for dizziness, extremity weakness, gait problem, headaches, light-headedness and seizures.  Hematological: Negative for adenopathy. Does not bruise/bleed easily.  Psychiatric/Behavioral: Positive for nervous/anxiousness. Negative for confusion, depression and sleep disturbance.   PHYSICAL EXAMINATION:  Blood pressure 111/76, pulse 79, temperature 97.8 F (36.6 C), temperature source Oral, resp. rate 16, height _0  (1.727 m), weight 149 lb 1.6 oz (67.6 kg), SpO2 100 %.  ECOG PERFORMANCE STATUS: 1  Physical Exam  Constitutional: Oriented to person, place,  and time and well-developed, well-nourished, and in no distress.  HENT:  Head: Normocephalic and atraumatic.  Mouth/Throat: Oropharynx is clear and moist. No oropharyngeal exudate.  Eyes: Conjunctivae are normal. Right eye exhibits no discharge. Left eye exhibits no discharge. No scleral icterus.  Neck: Normal range of motion. Neck supple.  Cardiovascular: Normal rate, regular rhythm, normal heart sounds and intact distal pulses.   Pulmonary/Chest: Effort normal and breath sounds normal. No respiratory distress. No wheezes. No rales.  Abdominal: Soft. Bowel sounds are normal. Exhibits no distension and no mass. There is no tenderness.  Musculoskeletal: Normal range of motion. Exhibits no edema.  Lymphadenopathy:    No cervical adenopathy.  Neurological: Alert and oriented to person, place, and time. Exhibits normal muscle tone. Gait normal. Coordination normal.  Skin: Skin is warm and dry. No rash noted. Not diaphoretic. No erythema. No pallor.  Psychiatric: Positive for anxious/nervous. The patient is tearful. Mood, memory and judgment normal.  Vitals reviewed.  LABORATORY DATA: Lab Results  Component Value Date   WBC 4.5 03/28/2022   HGB 14.4 03/28/2022   HCT 42.2 03/28/2022   MCV 93.6 03/28/2022   PLT 119 (L) 03/28/2022      Chemistry      Component Value Date/Time   NA 140 03/28/2022 1040   K 3.9 03/28/2022 1040   CL 107 03/28/2022 1040   CO2 28 03/28/2022 1040   BUN 13 03/28/2022 1040   CREATININE 0.67 03/28/2022 1040      Component Value Date/Time   CALCIUM 9.5 03/28/2022 1040   ALKPHOS 58 03/28/2022 1040   AST 20 03/28/2022 1040   ALT 27 03/28/2022 1040   BILITOT 0.3 03/28/2022 1040       RADIOGRAPHIC STUDIES:  NM PET Image Initial (PI) Skull Base To Thigh  Result Date: 03/12/2022 CLINICAL DATA:  Subsequent treatment strategy for rectal carcinoma. Concern for recurrence at the rectosigmoid junction. RIGHT ovary recurrence with resection. EXAM: NUCLEAR  MEDICINE PET SKULL BASE TO THIGH TECHNIQUE: 7.4 mCi F-18 FDG was injected intravenously. Full-ring PET imaging was performed from the skull base to thigh after the radiotracer. CT data was obtained and used for attenuation correction and anatomic localization. Fasting blood glucose: 107 mg/dl COMPARISON:  01/22/2022 FINDINGS: Mediastinal blood pool activity:1.8 Liver activity: SUV max NA NECK: No hypermetabolic lymph nodes in the neck. Incidental CT findings: none CHEST: No hypermetabolic mediastinal or hilar nodes. No suspicious pulmonary nodules on the CT scan. Incidental CT findings: Port in the anterior chest wall with tip in distal SVC. ABDOMEN/PELVIS: No abnormal hypermetabolic activity within the liver, pancreas, adrenal glands, or spleen. No hypermetabolic lymph nodes in the abdomen or pelvis. Anastomosis at rectosigmoid junction without metabolic activity. No abnormal pelvic activity. Incidental CT findings: none SKELETON: No focal hypermetabolic activity to suggest skeletal metastasis. Incidental CT findings: none IMPRESSION: No FDG PET/CT evidence of rectal carcinoma recurrence or metastasis. Electronically Signed   By: Suzy Bouchard M.D.   On: 03/12/2022 14:21     ASSESSMENT/PLAN:  Stephanie [name] is a 41 y.o. female with    1. 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 She was found to have perforation and positive radial margin. Liver biopsy on 04/16/20 confirmed metastatic disease from her colon cancer -She received first line chemo with FOLFOXIRI q2 weeks for 6 months from 05/23/20 to 10/24/20. Bevacizumab added with C2.  -s/p liver resection on 12/13/20 under Dr. Barry Dienes. Pathology showed: metastatic colon carcinoma to liver showing approximately 80%  necrosis, resection margin negative.  -s/p colostomy takedown on 05/30/21 with Dr. Barry Dienes. Pathology was benign. -she has been on surveillance. Scans have been NED except for new right ovarian/adnexal cyst in 09/2021 (not seen 07/2021). -she underwent right ovary removal 02/25/22 with Dr. Polly Cobia. Path revealed adenocarcinoma, likely colon origin.  Dr. Burr Medico previously reviewed her pathology findings in detail with her.  This is likely peritoneal metastasis from her initial perforated colon cancer. -restaging PET on 03/12/22 showed NED.  Although she has no evidence on recent PET scan, there is a very high possibility that if she still has microscopic disease.  Her risk of recurrence is very high given the both liver and peritoneal metastasis.  Dr. Burr Medico recommended pursuing adjuvant systemic treatment. Dr. Ernestina Penna recommendation is for CAPEOX for 3 months, then continue xeloda alone for additional 3 months.  She previously had reaction  (tight throat and dyspnea). Dr. Burr Medico recommend adding Benadryl, Pepcid in addition to steroids as premedication, and do dose reduction (also due to her mild residual neuropathy ), and slow infusion. She agrees to proceed with Capox  -The patient is understandably anxious today. The patient was seen with Dr. Burr Medico. After some discussion, the patient agreed to proceed. Dr. Burr Medico mentioned signs and symptoms to monitor for. I also spoke to the patient's nurse who is aware of her infusion reaction history. Of course, if the patient has a reaction to oxaliplatin despite pre-medications, Dr. Burr Medico will likely discontinue treatment with oxaliplatin  -Dr. Burr Medico will send in 10 tablets of norco only if needed for abdominal pain/cramping.  -I let the patient know she can take another ativan and bring them to the clinic if needed in the future.  -Labs were reviewed. Recommend that she proceed with cycle #1 today as scheduled.  -F/U via telephone call next week for toxicity check  2. Ovarian  cysts, bilateral -new on CT scan in Jan 2023, right side 5.2cm, causes a lot of pain for her -s/p bilateral salpingectomy and right oophorectomy on 02/25/22 with Dr. Polly Cobia. Pathology showed ovary with moderately differentiated adenocarcinoma. Pelvic fluid, b/l fallopian tubes, and endometrium curettage were all benign.   3.  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 More so in her feet, but she does note some functional deficits in her fingers.   4. Mild thrombocytopenia -Secondary to chemotherapy -overall stable, 119k on 03/28/22   5. Genetics  -Genetics consult on 12/04/20, genetic test was drawn 01/17/21 -Results were negative     Plan: -We will start CapeOx with dose reduction today  -Dr. Burr Medico will send Norco to the pharmacy -F/U 1 week telephone call for toxicity check.        No orders of the defined types were placed in this encounter.      Samuel Mcpeek L Tomicka Lover, PA-C 03/28/22   Addendum  I have seen the patient, examined her. I agree with the assessment and and plan and have edited the notes.   Mendel Ryder is here to start first cycle of adjuvant CapeOx.  He is very concerned about potential infusion reaction, especially abdominal cramps.  I have increased her premedication, and will do oxaliplatin infusion over 4 hours.  She wants to have tramadol at home in case she develops abdominal pain, she had before after oxaliplatin infusion.  I will call in 15 tablets for her, she knows to use only for severe and persistent pain as needed.  She will start capecitabine today.  All questions were answered.  Lab reviewed, will proceed chemo today.  Follow-up in 3 weeks before next cycle.  [name]  03/28/2022

## 2022-03-28 ENCOUNTER — Other Ambulatory Visit: Payer: Self-pay

## 2022-03-28 ENCOUNTER — Encounter: Payer: Self-pay | Admitting: Hematology

## 2022-03-28 ENCOUNTER — Inpatient Hospital Stay: Payer: BC Managed Care – PPO

## 2022-03-28 ENCOUNTER — Inpatient Hospital Stay (HOSPITAL_BASED_OUTPATIENT_CLINIC_OR_DEPARTMENT_OTHER): Payer: BC Managed Care – PPO | Admitting: Physician Assistant

## 2022-03-28 VITALS — BP 121/77 | HR 82 | Resp 18

## 2022-03-28 VITALS — BP 111/76 | HR 79 | Temp 97.8°F | Resp 16 | Ht 68.0 in | Wt 149.1 lb

## 2022-03-28 DIAGNOSIS — C19 Malignant neoplasm of rectosigmoid junction: Secondary | ICD-10-CM

## 2022-03-28 DIAGNOSIS — Z5111 Encounter for antineoplastic chemotherapy: Secondary | ICD-10-CM | POA: Diagnosis not present

## 2022-03-28 DIAGNOSIS — Z95828 Presence of other vascular implants and grafts: Secondary | ICD-10-CM

## 2022-03-28 LAB — CMP (CANCER CENTER ONLY)
ALT: 27 U/L (ref 0–44)
AST: 20 U/L (ref 15–41)
Albumin: 4.2 g/dL (ref 3.5–5.0)
Alkaline Phosphatase: 58 U/L (ref 38–126)
Anion gap: 5 (ref 5–15)
BUN: 13 mg/dL (ref 6–20)
CO2: 28 mmol/L (ref 22–32)
Calcium: 9.5 mg/dL (ref 8.9–10.3)
Chloride: 107 mmol/L (ref 98–111)
Creatinine: 0.67 mg/dL (ref 0.44–1.00)
GFR, Estimated: >60 mL/min (ref >60)
Glucose, Bld: 97 mg/dL (ref 70–99)
Potassium: 3.9 mmol/L (ref 3.5–5.1)
Sodium: 140 mmol/L (ref 135–145)
Total Bilirubin: 0.3 mg/dL (ref 0.3–1.2)
Total Protein: 7 g/dL (ref 6.5–8.1)

## 2022-03-28 LAB — CBC WITH DIFFERENTIAL (CANCER CENTER ONLY)
Abs Immature Granulocytes: 0 10*3/uL (ref 0.00–0.07)
Basophils Absolute: 0 10*3/uL (ref 0.0–0.1)
Basophils Relative: 0 %
Eosinophils Absolute: 0.1 10*3/uL (ref 0.0–0.5)
Eosinophils Relative: 2 %
HCT: 42.2 % (ref 36.0–46.0)
Hemoglobin: 14.4 g/dL (ref 12.0–15.0)
Immature Granulocytes: 0 %
Lymphocytes Relative: 23 %
Lymphs Abs: 1.1 10*3/uL (ref 0.7–4.0)
MCH: 31.9 pg (ref 26.0–34.0)
MCHC: 34.1 g/dL (ref 30.0–36.0)
MCV: 93.6 fL (ref 80.0–100.0)
Monocytes Absolute: 0.3 10*3/uL (ref 0.1–1.0)
Monocytes Relative: 8 %
Neutro Abs: 3 10*3/uL (ref 1.7–7.7)
Neutrophils Relative %: 67 %
Platelet Count: 119 10*3/uL — ABNORMAL LOW (ref 150–400)
RBC: 4.51 MIL/uL (ref 3.87–5.11)
RDW: 12.5 % (ref 11.5–15.5)
WBC Count: 4.5 10*3/uL (ref 4.0–10.5)
nRBC: 0 % (ref 0.0–0.2)

## 2022-03-28 LAB — CEA (IN HOUSE-CHCC): CEA (CHCC-In House): 3.75 ng/mL (ref 0.00–5.00)

## 2022-03-28 MED ORDER — DEXTROSE 5 % IV SOLN: Freq: Once | INTRAVENOUS | Status: AC

## 2022-03-28 MED ORDER — HEPARIN SOD (PORK) LOCK FLUSH 100 UNIT/ML IV SOLN
500.0000 [IU] | Freq: Once | INTRAVENOUS | Status: AC | PRN
  Administered 2022-03-28: 500 [IU]

## 2022-03-28 MED ORDER — SODIUM CHLORIDE 0.9% FLUSH
10.0000 mL | Freq: Once | INTRAVENOUS | Status: AC
  Administered 2022-03-28: 10 mL

## 2022-03-28 MED ORDER — TRAMADOL HCL 50 MG PO TABS
50.0000 mg | ORAL_TABLET | Freq: Four times a day (QID) | ORAL | 0 refills | Status: DC | PRN
Start: 2022-03-28 — End: 2022-04-18

## 2022-03-28 MED ORDER — DIPHENHYDRAMINE HCL 50 MG/ML IJ SOLN
50.0000 mg | Freq: Once | INTRAMUSCULAR | Status: AC
  Administered 2022-03-28: 50 mg via INTRAVENOUS
  Filled 2022-03-28: qty 1

## 2022-03-28 MED ORDER — OXALIPLATIN CHEMO INJECTION 100 MG/20ML
75.0000 mg/m2 | Freq: Once | INTRAVENOUS | Status: AC
  Administered 2022-03-28: 130 mg via INTRAVENOUS
  Filled 2022-03-28: qty 26

## 2022-03-28 MED ORDER — SODIUM CHLORIDE 0.9% FLUSH
10.0000 mL | INTRAVENOUS | Status: DC | PRN
  Administered 2022-03-28: 10 mL

## 2022-03-28 MED ORDER — FAMOTIDINE IN NACL 20-0.9 MG/50ML-% IV SOLN
20.0000 mg | Freq: Once | INTRAVENOUS | Status: AC
  Administered 2022-03-28: 20 mg via INTRAVENOUS
  Filled 2022-03-28: qty 50

## 2022-03-28 MED ORDER — METHYLPREDNISOLONE SODIUM SUCC 125 MG IJ SOLR
125.0000 mg | Freq: Every day | INTRAMUSCULAR | Status: DC
  Administered 2022-03-28: 125 mg via INTRAVENOUS
  Filled 2022-03-28: qty 2

## 2022-03-28 MED ORDER — PALONOSETRON HCL INJECTION 0.25 MG/5ML
0.2500 mg | Freq: Once | INTRAVENOUS | Status: AC
  Administered 2022-03-28: 0.25 mg via INTRAVENOUS
  Filled 2022-03-28: qty 5

## 2022-03-28 NOTE — Patient Instructions (Signed)
[facility]   Discharge Instructions: Thank you for choosing [facility] to provide your oncology and hematology care.   If you have a lab appointment with the [facility], please go directly to the [facility] and check in at the registration area.   Wear comfortable clothing and clothing appropriate for easy access to any Portacath or PICC line.   We strive to give you quality time with your provider. You may need to reschedule your appointment if you arrive late (15 or more minutes).  Arriving late affects you and other patients whose appointments are after yours.  Also, if you miss three or more appointments without notifying the office, you may be dismissed from the clinic at the provider's discretion.      For prescription refill requests, have your pharmacy contact our office and allow 72 hours for refills to be completed.    Today you received the following chemotherapy and/or immunotherapy agents: Oxaliplatin       To help prevent nausea and vomiting after your treatment, we encourage you to take your nausea medication as directed.  BELOW ARE SYMPTOMS THAT SHOULD BE REPORTED IMMEDIATELY: *FEVER GREATER THAN 100.4 F (38 C) OR HIGHER *CHILLS OR SWEATING *NAUSEA AND VOMITING THAT IS NOT CONTROLLED WITH YOUR NAUSEA MEDICATION *UNUSUAL SHORTNESS OF BREATH *UNUSUAL BRUISING OR BLEEDING *URINARY PROBLEMS (pain or burning when urinating, or frequent urination) *BOWEL PROBLEMS (unusual diarrhea, constipation, pain near the anus) TENDERNESS IN MOUTH AND THROAT WITH OR WITHOUT PRESENCE OF ULCERS (sore throat, sores in mouth, or a toothache) UNUSUAL RASH, SWELLING OR PAIN  UNUSUAL VAGINAL DISCHARGE OR ITCHING   Items with * indicate a potential emergency and should be followed up as soon as possible or go to the Emergency Department if any problems should occur.  Please show the CHEMOTHERAPY ALERT CARD or IMMUNOTHERAPY ALERT CARD at  check-in to the Emergency Department and triage nurse.  Should you have questions after your visit or need to cancel or reschedule your appointment, please contact [facility]  Dept: [phone]  and follow the prompts.  Office hours are 8:00 a.m. to 4:30 p.m. Monday - Friday. Please note that voicemails left after 4:00 p.m. may not be returned until the following business day.  We are closed weekends and major holidays. You have access to a nurse at all times for urgent questions. Please call the main number to the clinic Dept: [phone] and follow the prompts.   For any non-urgent questions, you may also contact your provider using MyChart. We now offer e-Visits for anyone 38 and older to request care online for non-urgent symptoms. For details visit [email or web address].   Also download the MyChart app! Go to the app store, search "MyChart", open the app, select [facility], and log in with your MyChart username and password.  Masks are optional in the cancer centers. If you would like for your care team to wear a mask while they are taking care of you, please let them know. For doctor visits, patients may have with them one support person who is at least 42 years old. At this time, visitors are not allowed in the infusion area.

## 2022-03-30 ENCOUNTER — Encounter: Payer: Self-pay | Admitting: Hematology

## 2022-03-31 ENCOUNTER — Telehealth: Payer: Self-pay

## 2022-03-31 ENCOUNTER — Telehealth: Payer: Self-pay | Admitting: *Deleted

## 2022-03-31 ENCOUNTER — Telehealth: Payer: Self-pay | Admitting: Physician Assistant

## 2022-03-31 NOTE — Telephone Encounter (Signed)
R/s pt's phone visit with Cassie. Spoke to pt who is aware of new appt time.

## 2022-03-31 NOTE — Telephone Encounter (Signed)
-----   Message from Stephanie Gilbert, RN sent at 03/28/2022  5:44 PM EDT ----- Regarding: First Time Oxaliplatin - Dr. Burr Medico Patient First Time Oxaliplatin - Dr. Burr Medico Patient Patient tolerated treatment well.

## 2022-03-31 NOTE — Telephone Encounter (Signed)
Spoke with pt regarding c/o heartburn.  Pt wants Dr. Burr Medico to prescribe the medication she took for acid reflux/heart burn during her previous treatment.  Pt stated the medication starts with a "P" but denies it being Pepcid (it might be Protonix).  Asked pt if she is currently taking anything for heartburn.  Pt denied.  Pt also denied heart palpitations or chest pain.  Informed pt that Dr. Burr Medico and Cira Rue, NP are both out of the office at this time but this RN will notify them both of her complaint.  Instructed pt in the interim to take Tums and Pepcid OTC to help relieve her symptoms.  Pt verbalized understanding of instructions.  Pt stated she's taking the Capecitabine with food.  Pt had no further questions or concerns at this time.

## 2022-03-31 NOTE — Telephone Encounter (Signed)
Called pt to see how she did with her recent treatment.  She reports doing OK, sleeping a lot.  She denies any other problems except some swelling under her eye yesterday but this went away.  She was informed of her next appt & states she knows how to reach [place] if needed.

## 2022-04-02 ENCOUNTER — Telehealth: Payer: Self-pay | Admitting: Physician Assistant

## 2022-04-02 NOTE — Telephone Encounter (Signed)
Scheduled per 06/30 los, patient has been called and voicemail was left.

## 2022-04-02 NOTE — Progress Notes (Signed)
[facility] HEMATOLOGY-ONCOLOGY TeleHEALTH VISIT PROGRESS NOTE   I connected with Stephanie [name] on 04/04/22 at 10:00 AM EDT by telephone and verified that I am speaking with the correct person using two identifiers.  I discussed the limitations, risks, security and privacy concerns of performing an evaluation and management service by telemedicine and the availability of in-person appointments. I also discussed with the patient that there may be a patient responsible charge related to this service. The patient expressed understanding and agreed to proceed.  Other persons participating in the visit and their role in the encounter: None  Patient's location: Home  Provider's location: [place], [facility] [address] [id number]  DIAGNOSIS:  f/u of metastatic colon cancer  Oncology History Overview Note  Cancer Staging Malignant neoplasm of rectosigmoid junction [facility]) Staging form: Colon and Rectum, AJCC 8th Edition - Pathologic stage from 04/04/2020: pT4a, pN1b, cM1 - Signed by [name], NP on 05/07/2020    Malignant neoplasm of rectosigmoid junction ([place])  11/10/2019 Imaging   MRI Abdomen  IMPRESSION: 1. Redemonstrated hypoenhancing lesion of the posterior liver dome, hepatic segment VII, reduced in size compared to prior examination, measuring 1.3 x 1.3 cm, previously 1.8 x 1.7 cm. Findings are consistent with treatment response of a biopsy proven metastasis. No other evidence of lymphadenopathy or metastatic disease within the abdomen or pelvis. 2. Unchanged mild splenomegaly, maximum coronal span 14.0 cm. 3. 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   03/12/2020 Initial Diagnosis   Malignant neoplasm of rectosigmoid junction ([place])   03/12/2020 Imaging   CT AP with contrast IMPRESSION: 1. Overall findings are highly concerning for colorectal carcinoma involving the sigmoid colon with an associated  perforation and adjacent abscess and phlegmon formation as detailed above. Currently, no collection is amenable to percutaneous drainage given their small size and location. 2. New 2 cm mass in the right hepatic lobe concerning for metastatic disease to the liver until proven otherwise. 3. Enlarged regional lymph nodes as detailed above is concerning for nodal metastatic disease. 4. Large stool burden. 5. Prominent pelvic veins which can be seen in patients with pelvic congestion syndrome.   03/13/2020 Imaging   ABD US IMPRESSION: Approximately 2.1 x 2.4 x 2.0 cm lobular homogeneously echogenic lesion in the right hepatic dome corresponds with the abnormality seen on the prior CT scan. Sonographically, this appearance is highly suggestive of a benign hemangioma.   Recommend MRI of the abdomen with gadolinium contrast which may provide a noninvasive diagnosis of benign hemangioma.    03/13/2020 Imaging   MR ABD W/WO CONTRAST Hepatobiliary: Diffuse low signal intensity throughout the hepatic parenchyma on T2 weighted images, presumably a consequence of recent Feraheme injection. In segment 7 of the liver (axial image 8 of series 5) there is a 2.5 x 1.9 cm well-defined lesion which is slightly T2 hyperintense. This lesion appears hyperintense on pre gadolinium T1 weighted images (likely a consequence of Feraheme). Interpretation of enhancement within the lesion is compromised by presence of Feraheme. No other hepatic lesions are confidently identified on today's examination. No intra or extrahepatic biliary ductal dilatation. Gallbladder is normal in appearance.   03/31/2020 Imaging   CT AP W contrast IMPRESSION: 1. Previously noted sigmoid colon mass appears increased in size, and again appears to be associated with a focal contained perforation which crosses the midline and has fistulized into the left adnexal region where there is now what appears to be a  large left tubo-ovarian  abscess, as detailed above. This is also associated with multiple enlarged lymph nodes in the pelvis measuring up to 1.2 cm in short axis and borderline enlarged retroperitoneal lymph nodes, concerning for metastatic disease. In addition, previously suspected metastatic lesion in segment 7 of the liver has enlarged. 2. Small volume of ascites. 3. Additional incidental findings, as above.   04/04/2020 Cancer Staging   Staging form: Colon and Rectum, AJCC 8th Edition - Pathologic stage from 04/04/2020: pT4a, pN1b, cM1 - Signed by [name], NP on 05/07/2020   04/04/2020 Procedure   Paracentesis, path showed no malignant cells (mixed acute and chronic inflammation present)   04/04/2020 Surgery   Open sigmoid colectomy and end colostomy by Dr. [name]   04/04/2020 Pathology Results   FINAL MICROSCOPIC DIAGNOSIS: A. COLON, RECTOSIGMOID, RESECTION: - Invasive moderately differentiated adenocarcinoma, 6 cm, involving rectosigmoid junction - Carcinoma invades into serosal surface with perforation and associated serositis - Radial resection margin is positive for carcinoma; proximal and distal margins are not involved - Lymphovascular invasion is present - Metastatic carcinoma to one of fifteen lymph nodes (1/15); one tumor deposit - See oncology table B. LYMPH NODES, MESENTERIC, RESECTION: - Metastatic adenocarcinoma to one of six lymph nodes (1/6) - One tumor deposit  Addendum to note 2 involved lymph nodes (of 21 examined nodes) pT4a,pN1b MMR-normal, preserved expression of MLH1, MSH2, MSH6, PMS2   04/12/2020 Procedure   PAC placement    04/13/2020 Imaging   CT chest without contrast IMPRESSION: Interval development of bilateral pleural effusions, left slightly greater than right, with resultant bibasilar atelectasis including subtotal collapse of the left lower lobe. No evidence of intrathoracic metastatic disease, though evaluation of the collapsed parenchyma is limited. Hepatic  metastasis again demonstrated.     04/16/2020 Pathology Results   FINAL MICROSCOPIC DIAGNOSIS:  A. LIVER, RIGHT LOBE, BIOPSY:  - Adenocarcinoma.  COMMENT:  The morphology is compatible with the provided clinical history of colorectal carcinoma.    05/23/2020 - 10/24/2020 Chemotherapy   FOLFIRINOX q2weeks for 3-6 months starting 05/23/20. Bevacizumab-bvzr [name] added with C2. Oxaliplatin held with C11-12 due to reaction. (pt developed SOB, chest palpitation and abdominal discomfort shortly after oxaliplatin started). Completed on 10/24/20.   08/05/2020 Imaging   CT AP  IMPRESSION: 1. Postsurgical changes of distal colectomy with a left lower quadrant end ostomy. No evidence of obstruction or acute complication at this time. Excluded rectal pouch in the deep pelvis without acute complication or worrisome features. 2. Slight interval decrease in size of a hypoattenuating lesion posterior right lobe liver measuring 1.7 x 1.8 x 2 cm. This lesion has previously undergone ultrasound-guided biopsy with pathologic results demonstrating adenocarcinoma compatible with metastatic disease from patient's resected colorectal carcinoma. 3. Slight prominence of the parametrial vessels bilaterally, nonspecific though can be seen in the setting of pelvic congestion syndrome. 4. Mild splenomegaly.  No focal lesion.     11/12/2020 Imaging   CT Chest  IMPRESSION: 1. No evidence of metastatic disease in the chest. 2. 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   12/13/2020 Surgery   A. LIVER, RIGHT, PARTIAL HEPATECTOMY WITH GALLBLADDER:  - Metastatic colon carcinoma to the liver showing approximately 80%  necrosis  - Resection margin is 0.8 cm from carcinoma  - Uninvolved liver parenchyma with no specific histopathologic changes  - Gallbladder with no specific histopathologic changes    01/28/2021 Genetic Testing   Negative genetic testing:  No pathogenic  variants detected on the [facility] CustomNext-Cancer + RNAinsight panel. The report date is 01/28/2021.   The CustomNext-Cancer+RNAinsight panel offered by [facility] included sequencing and rearrangement analysis for the following 47 genes:  APC, ATM, AXIN2, BARD1, BMPR1A, BRCA1, BRCA2, BRIP1, CDH1, CDK4, CDKN2A, CHEK2, DICER1, EPCAM, GREM1, HOXB13, MEN1, MLH1, MSH2, MSH3, MSH6, MUTYH, NBN, NF1, NF2, NTHL1, PALB2, PMS2, POLD1, POLE, PTEN, RAD51C, RAD51D, RECQL, RET, SDHA, SDHAF2, SDHB, SDHC, SDHD, SMAD4, SMARCA4, STK11, TP53, TSC1, TSC2, and VHL.  RNA data is routinely analyzed for use in variant interpretation for all genes.   02/19/2021 Imaging   CT A/P w/o contrast  IMPRESSION: Slightly limited examination examination in absence of contrast administration. Status post partial right hepatectomy. Interval decrease in size in perihepatic fluid collection and resolution of right subdiaphragmatic fluid and gas. No new intra-abdominal fluid collections are identified.   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 Moderate stool throughout the colon without evidence of obstruction.   Fluid distension of the proximal duodenum to the level of the SMA hiatus which appears narrow. 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   05/05/2021 Imaging   CT AP  IMPRESSION: Postsurgical changes as described stable in appearance from the prior exam.   Changes suggestive of mild pelvic varices.   08/22/2021 Imaging   EXAM: CT ABDOMEN AND PELVIS WITH CONTRAST  IMPRESSION: 1. Extensive bowel content is identified throughout the colon consistent with constipation. 2. 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 3. Stable postoperative changes in the right lobe liver.   09/12/2021 Survivorship   SCP delivered by  Cira Rue, NP   10/05/2021 Imaging   EXAM: CT ABDOMEN AND PELVIS WITH CONTRAST  IMPRESSION: 1. Right ovarian/adnexal cyst, 5.2 cm. Because this lesion is not adequately characterized, prompt [place] is recommended for further evaluation. Note: This recommendation does not apply to premenarchal patients and to those with increased risk (genetic, family history, elevated tumor markers or other high-risk factors) of ovarian cancer. Reference: JACR 2020 Feb; 17(2):248-254 2. No other evidence of an acute abnormality within the abdomen or pelvis. 3. Moderate increase in the colonic and rectal stool burden. No bowel obstruction or inflammation. 4. No evidence of locally recurrent or metastatic colon carcinoma.   11/12/2021 Imaging   EXAM: CT CHEST WITHOUT CONTRAST  IMPRESSION: 1. No evidence of pulmonary metastatic disease. 2. Stable postoperative findings about the posterior right lobe of the liver.   03/28/2022 -  Chemotherapy   Patient is on Treatment Plan : COLORECTAL Xelox (Capeox) q21d       CURRENT THERAPY: CAPOX, first dose today. Oxaliplatin dose reduced due to prior infusion reaction with additional premedications. Dr. Burr Medico recommends run at slower rate (over 4 hours).   INTERVAL HISTORY: Stephanie [name] 41 y.o. female and I connected via a telephone visit today.  Last week, the patient restarted treatment with CAPOX.  The purpose of today's telephone visit is for a 1 week telephone toxicity check. Overall the patient tolerated treatment well.  She did have 1 episode of nausea and vomiting 1 to 2 days following treatment that resolved with her antiemetic.  She is wondering how close together she can take her Xeloda.  She states that sometimes it makes her feel a little bit abnormal and she wants to be present for her daughter so she has been taking her morning dose a little bit later  in the day and waking up in the middle the night to take her p.m. dose.  She also endorsed increased  reflux and is wondering what she may take that is compatible with her Xeloda.  She also takes a over-the-counter mushroom and beet supplement and is wondering if she is able to take that with her Xeloda.  She experienced some abdominal discomfort during the course of her infusion that improved with fluids  Dr. Burr Medico had given the patient a prescription for Norco if needed for abdominal pain and cramping.  She denies any abdominal cramping at this time.  Otherwise the patient denies any fever, chills, or night sweats.  She reports feeling hot but no sweats. She reports a stable to slightly diminished appetite a few days following treatment but she is ensuring that she eats.  She had some constipation for which she took MiraLAX which was helpful for her.  She endorses some mild gum sensitivity.  Denies any abnormal bleeding or bruising.  Denies any rashes or skin changes.  She denies any changes in her peripheral neuropathy at baseline.  She does mention that she is struggling with anxiety and depression and she is wondering if this is normal.  She would be interested in medication management for her anxiety and depression and additional resources.    MEDICAL HISTORY: Past Medical History:  Diagnosis Date   Anxiety    Blood transfusion without reported diagnosis    Bowel obstruction (HCC)    BV (bacterial vaginosis)    Colon cancer [facility])    Colon Cancer 03-2020   Colon polyps    Colostomy present ([place])    03-2020   Family history of bladder cancer    History of chemotherapy    ended 09-2020   Lactose intolerance 03/12/2020   Neuromuscular disorder (HCC)    neuropathy feet hands legs   UTI (lower urinary tract infection)     ALLERGIES:  is allergic to oxaliplatin.  MEDICATIONS:  Current Outpatient Medications  Medication Sig Dispense Refill   acetaminophen (TYLENOL) 325 MG tablet Take 2 tablets (650 mg total) by mouth every 6 (six) hours as needed for mild pain, moderate pain, fever or headache  (fever > 101).     AUROVELA 1.5/30 1.5-30 MG-MCG tablet TAKE ONE TABLET BY MOUTH DAILY 21 tablet 6   capecitabine (XELODA) 500 MG tablet Take 3 tablets (1,500 mg total) by mouth 2 (two) times daily after a meal. Take for 14 days on, then off 7 days. Repeat every 21 days. 84 tablet 1   HYDROcodone-acetaminophen (NORCO/VICODIN) 5-325 MG tablet Take 1 tablet by mouth every 6 (six) hours as needed for severe pain. 8 tablet 0   ibuprofen (ADVIL) 600 MG tablet Take 1 tablet (600 mg total) by mouth every 6 (six) hours as needed for headache, mild pain, moderate pain or cramping. 30 tablet 2   Lactic Ac-Citric Ac-Pot Bitart (PHEXXI) 1.8-1-0.4 % GEL Place 5 g vaginally as needed. 60 g 1   lidocaine-prilocaine (EMLA) cream Apply to affected area once 30 g 3   meloxicam (MOBIC) 7.5 MG tablet Take 1 tablet (7.5 mg total) by mouth daily. 20 tablet 0   ondansetron (ZOFRAN) 8 MG tablet Take 1 tablet (8 mg total) by mouth every 8 (eight) hours as needed for nausea or vomiting. 30 tablet 0   Prenatal Vit-Fe Fumarate-FA (PRENATAL MULTIVITAMIN) TABS tablet Take 1 tablet by mouth daily at 12 noon.     prochlorperazine (COMPAZINE) 10 MG tablet Take 1  tablet (10 mg total) by mouth every 6 (six) hours as needed (Nausea or vomiting). 30 tablet 1   sulfamethoxazole-trimethoprim (BACTRIM DS) 800-160 MG tablet Take 1 tablet by mouth 2 (two) times daily.     SUTAB [phone] MG TABS Take by mouth.     topiramate (TOPAMAX) 50 MG tablet Take 50 mg by mouth daily.     traMADol (ULTRAM) 50 MG tablet Take 1 tablet (50 mg total) by mouth every 6 (six) hours as needed. 15 tablet 0   No current facility-administered medications for this visit.    SURGICAL HISTORY:  Past Surgical History:  Procedure Laterality Date   CESAREAN SECTION     CHOLECYSTECTOMY  12/13/2020   COLECTOMY  03/2020   COLONOSCOPY     COLOSTOMY TAKEDOWN N/A 05/30/2021   Procedure: LAPAROSCOPIC ASSISTED COLOSTOMY TAKEDOWN;  Surgeon: [name], MD;   Location: [place];  Service: General;  Laterality: N/A;   CYSTOSCOPY WITH STENT PLACEMENT  04/04/2020   Procedure: CYSTOSCOPY WITH STENT PLACEMENT;  Surgeon: [name], MD;  Location: [facility];  Service: General;;   LAPAROSCOPIC LIVER ULTRASOUND N/A 12/13/2020   Procedure: INTRAOPERATIVE LIVER ULTRASOUND;  Surgeon: [name], MD;  Location: [place];  Service: General;  Laterality: N/A;   LAPAROSCOPY N/A 12/13/2020   Procedure: LAPAROSCOPY DIAGNOSTIC;  Surgeon: [name], MD;  Location: [place];  Service: General;  Laterality: N/A;   LAPAROTOMY N/A 04/04/2020   Procedure: EXPLORATORY LAPAROTOMY;  Surgeon: [name], MD;  Location: [facility];  Service: General;  Laterality: N/A;   OPEN PARTIAL HEPATECTOMY  N/A 12/13/2020   Procedure: OPEN PARTIAL HEPATECTOMY;  Surgeon: [name], MD;  Location: [place];  Service: General;  Laterality: N/A;  ROOM 2 STARTING AT 09:30AM FOR 300 MIN   PORTACATH PLACEMENT Right 04/12/2020   Procedure: INSERTION PORT-A-CATH WITH ULTRASOUND;  Surgeon: Kieth Brightly Arta Bruce, MD;  Location: [facility];  Service: General;  Laterality: Right;    REVIEW OF SYSTEMS:   Review of Systems  Constitutional: Negative for appetite change, chills, fatigue, fever and unexpected weight change.  HENT:   Negative for mouth sores, nosebleeds, sore throat and trouble swallowing.   Eyes: Negative for eye problems and icterus.  Respiratory: Negative for cough, hemoptysis, shortness of breath and wheezing.   Cardiovascular: Negative for chest pain and leg swelling.  Gastrointestinal: Negative for abdominal pain, constipation, diarrhea, nausea and vomiting.  Genitourinary: Negative for bladder incontinence, difficulty urinating, dysuria, frequency and hematuria.   Musculoskeletal: Negative for back pain, gait problem, neck pain and neck stiffness.  Skin: Negative for itching and rash.  Neurological: Negative for dizziness, extremity weakness, gait problem, headaches, light-headedness  and seizures.  Hematological: Negative for adenopathy. Does not bruise/bleed easily.  Psychiatric/Behavioral: Negative for confusion, depression and sleep disturbance. The patient is not nervous/anxious.     PHYSICAL EXAMINATION:  There were no vitals taken for this visit.  ECOG PERFORMANCE STATUS: 1 - Symptomatic but completely ambulatory  Physical Exam  Constitutional: Oriented to person, place, and time and well-developed, well-nourished, and in no distress. No distress.  HENT:  Head: Normocephalic and atraumatic.  Mouth/Throat: Oropharynx is clear and moist. No oropharyngeal exudate.  Eyes: Conjunctivae are normal. Right eye exhibits no discharge. Left eye exhibits no discharge. No scleral icterus.  Neck: Normal range of motion. Neck supple.  Cardiovascular: Normal rate, regular rhythm, normal heart sounds and intact distal pulses.   Pulmonary/Chest: Effort normal and breath sounds normal. No respiratory distress. No wheezes. No rales.  Abdominal: Soft.  Bowel sounds are normal. Exhibits no distension and no mass. There is no tenderness.  Musculoskeletal: Normal range of motion. Exhibits no edema.  Lymphadenopathy:    No cervical adenopathy.  Neurological: Alert and oriented to person, place, and time. Exhibits normal muscle tone. Gait normal. Coordination normal.  Skin: Skin is warm and dry. No rash noted. Not diaphoretic. No erythema. No pallor.  Psychiatric: Mood, memory and judgment normal.  Vitals reviewed.  LABORATORY DATA: Lab Results  Component Value Date   WBC 4.5 03/28/2022   HGB 14.4 03/28/2022   HCT 42.2 03/28/2022   MCV 93.6 03/28/2022   PLT 119 (L) 03/28/2022      Chemistry      Component Value Date/Time   NA 140 03/28/2022 1040   K 3.9 03/28/2022 1040   CL 107 03/28/2022 1040   CO2 28 03/28/2022 1040   BUN 13 03/28/2022 1040   CREATININE 0.67 03/28/2022 1040      Component Value Date/Time   CALCIUM 9.5 03/28/2022 1040   ALKPHOS 58 03/28/2022 1040    AST 20 03/28/2022 1040   ALT 27 03/28/2022 1040   BILITOT 0.3 03/28/2022 1040       RADIOGRAPHIC STUDIES:  NM PET Image Initial (PI) Skull Base To Thigh  Result Date: 03/12/2022 CLINICAL DATA:  Subsequent treatment strategy for rectal carcinoma. Concern for recurrence at the rectosigmoid junction. RIGHT ovary recurrence with resection. EXAM: NUCLEAR MEDICINE PET SKULL BASE TO THIGH TECHNIQUE: 7.4 mCi F-18 FDG was injected intravenously. Full-ring PET imaging was performed from the skull base to thigh after the radiotracer. CT data was obtained and used for attenuation correction and anatomic localization. Fasting blood glucose: 107 mg/dl COMPARISON:  01/22/2022 FINDINGS: Mediastinal blood pool activity:1.8 Liver activity: SUV max NA NECK: No hypermetabolic lymph nodes in the neck. Incidental CT findings: none CHEST: No hypermetabolic mediastinal or hilar nodes. No suspicious pulmonary nodules on the CT scan. Incidental CT findings: Port in the anterior chest wall with tip in distal SVC. ABDOMEN/PELVIS: No abnormal hypermetabolic activity within the liver, pancreas, adrenal glands, or spleen. No hypermetabolic lymph nodes in the abdomen or pelvis. Anastomosis at rectosigmoid junction without metabolic activity. No abnormal pelvic activity. Incidental CT findings: none SKELETON: No focal hypermetabolic activity to suggest skeletal metastasis. Incidental CT findings: none IMPRESSION: No FDG PET/CT evidence of rectal carcinoma recurrence or metastasis. Electronically Signed   By: Suzy Bouchard M.D.   On: 03/12/2022 14:21     ASSESSMENT/PLAN:  Stephanie [name] is a 41 y.o. female with    1. 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 She was found to have  perforation and positive radial margin. Liver biopsy on 04/16/20 confirmed metastatic disease from her colon cancer -She received first line chemo with FOLFOXIRI q2 weeks for 6 months from 05/23/20 to 10/24/20. Bevacizumab added with C2.  -s/p liver resection on 12/13/20 under Dr. Barry Dienes. Pathology showed: metastatic colon carcinoma to liver showing approximately 80% necrosis, resection margin negative.  -s/p colostomy takedown on 05/30/21 with Dr. Barry Dienes. Pathology was benign. -she has been on surveillance. Scans have been NED except for new right ovarian/adnexal cyst in 09/2021 (not seen 07/2021). -she underwent right ovary removal 02/25/22 with Dr. Polly Cobia. Path revealed adenocarcinoma, likely colon origin.  Dr. Burr Medico previously reviewed her pathology findings in  detail with her.  This is likely peritoneal metastasis from her initial perforated colon cancer. -restaging PET on 03/12/22 showed NED.  Although she has no evidence on recent PET scan, there is a very high possibility that if she still has microscopic disease.  Her risk of recurrence is very high given the both liver and peritoneal metastasis. Dr. Burr Medico recommended pursuing adjuvant systemic treatment. Dr. Ernestina Penna recommendation is for CAPEOX for 3 months, then continue xeloda alone for additional 3 months.  She previously had reaction  (tight throat and dyspnea). Dr. Burr Medico recommend adding Benadryl, Pepcid in addition to steroids as premedication, and do dose reduction (also due to her mild residual neuropathy ), and slow infusion. She agrees to proceed with Capox  -She is status post 1 cycle and tolerated it fairly well. -I reviewed with the patient to avoid taking any herbal supplements over-the-counter.  Encouraged her to discontinue taking her mushroom herbal supplement and beet supplement. -Reach out to the pharmacist who instructed that the patient should not take her Xeloda doses less than 10 hours apart.  -Dr. Burr Medico prescribed 10 tablets of norco  only if needed for abdominal pain/cramping.  She has not needed to take this at this time.  -F/U in 2 weeks before starting cycle #2.   2.  Anxiety and depression -Patient endorses some anxiety and depression with the weight that her diagnosis and how it is changing her "normal".  She is also concerned about the weight side effects to make her feel and how that would impact her ability to parent her daughter -We reach out to Dr. Belva Bertin office, in addition to coping mechanisms, the patient is interested in prescription management of anxiety and depression. -Their office walked her through the process of how to place a referral on her able to help on both fronts with prescription management and coping mechanisms. -I also sent the patient information about the lung cancer support groups and resources  3.  Chemo side effects (gum soreness, gerd, nausea or vomiting, constipation) -Overall the patient tolerated cycle #1 well except for some mild nausea vomiting, gum soreness, constipation -Reviewed moving forward if she finds that she has nausea vomiting on day 1 and 2 following treatment that she can take her antiemetic first thing in the morning to try and see if that mitigates any nausea or vomiting -Encourage good oral hygiene and salt water rinses and Biotene for gum soreness -Reviewed constipation education with the patient.  Advised to take stool softener she finds that she has frequent constipation to help keep her regular.  If she is actively constipated encouraged to take laxatives.  Also encouraged her to increase her fluid intake, eat plenty of fruits and vegetables, be as active as possible to reduce the risk of constipation. -Advised to use Pepcid and Tums if needed for GERD due to uncertainty of drug to drug interactions with PPIs   4. Ovarian cysts, bilateral -new on CT scan in Jan 2023, right side 5.2cm, causes a lot of pain for her -s/p bilateral salpingectomy and right  oophorectomy on 02/25/22 with Dr. Polly Cobia. Pathology showed ovary with moderately differentiated adenocarcinoma. Pelvic fluid, b/l fallopian tubes, and endometrium curettage were all benign.   5.  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 More so in her feet, but she does note some functional deficits in her fingers.   6. Mild thrombocytopenia -Secondary to chemotherapy -overall stable, 119k on 03/28/22   6.  Genetics  -Genetics consult on 12/04/20, genetic test was drawn 01/17/21 -Results were negative     Plan: -Vies to take Pepcid and Tums if needed for reflux due to conflicting information about PPIs with Xeloda -My RN reach out to Dr. Michail Sermon, psychologist.  The patient specifically is interested in medication management in addition to techniques to help manage anxiety and depression.  He walked [place] through the process of placing referral to their office and discussed that they have someone in the office who can also help with prescription management in addition to him reviewing coping mechanisms. -I have also sent the patient a MyChart message regarding the lung cancer support groups if interested -Encouraged to do salt water rinses and Biotene for gum sensitivity -Reviewed constipation education -Reviewed with [name] that Xeloda should be taken no less than 10 hours apart -Advised to avoid over-the-counter herbal supplements. -F/U 2 weeks before cycle #2.    I discussed the assessment and treatment plan with the patient. The patient was provided an opportunity to ask questions and all were answered. The patient agreed with the plan and demonstrated an understanding of the instructions.  The patient was advised to call back or seek an in-person evaluation if the symptoms worsen or if the condition fails to improve as anticipated.  I provided 20-29 minutes of non face-to-face telephone visit time during this encounter, and > 50%  was spent counseling as documented under my assessment & plan.  Inez Stantz L [place] Stell, PA-C 04/04/2022 10:48 AM  No orders of the defined types were placed in this encounter.    Samariah Hokenson L Adamarie Izzo, PA-C 04/04/22

## 2022-04-04 ENCOUNTER — Telehealth: Payer: BC Managed Care – PPO | Admitting: Physician Assistant

## 2022-04-04 ENCOUNTER — Telehealth: Payer: Self-pay

## 2022-04-04 ENCOUNTER — Inpatient Hospital Stay: Payer: BC Managed Care – PPO | Attending: Nurse Practitioner | Admitting: Physician Assistant

## 2022-04-04 ENCOUNTER — Encounter: Payer: Self-pay | Admitting: Physician Assistant

## 2022-04-04 DIAGNOSIS — F419 Anxiety disorder, unspecified: Secondary | ICD-10-CM | POA: Diagnosis not present

## 2022-04-04 DIAGNOSIS — C786 Secondary malignant neoplasm of retroperitoneum and peritoneum: Secondary | ICD-10-CM | POA: Insufficient documentation

## 2022-04-04 DIAGNOSIS — F418 Other specified anxiety disorders: Secondary | ICD-10-CM | POA: Diagnosis not present

## 2022-04-04 DIAGNOSIS — N83202 Unspecified ovarian cyst, left side: Secondary | ICD-10-CM | POA: Insufficient documentation

## 2022-04-04 DIAGNOSIS — D696 Thrombocytopenia, unspecified: Secondary | ICD-10-CM | POA: Insufficient documentation

## 2022-04-04 DIAGNOSIS — G620 Drug-induced polyneuropathy: Secondary | ICD-10-CM | POA: Insufficient documentation

## 2022-04-04 DIAGNOSIS — F32A Depression, unspecified: Secondary | ICD-10-CM

## 2022-04-04 DIAGNOSIS — Z5111 Encounter for antineoplastic chemotherapy: Secondary | ICD-10-CM | POA: Diagnosis not present

## 2022-04-04 DIAGNOSIS — C19 Malignant neoplasm of rectosigmoid junction: Secondary | ICD-10-CM | POA: Insufficient documentation

## 2022-04-04 DIAGNOSIS — K5900 Constipation, unspecified: Secondary | ICD-10-CM | POA: Diagnosis not present

## 2022-04-04 DIAGNOSIS — C7961 Secondary malignant neoplasm of right ovary: Secondary | ICD-10-CM | POA: Diagnosis not present

## 2022-04-04 DIAGNOSIS — C772 Secondary and unspecified malignant neoplasm of intra-abdominal lymph nodes: Secondary | ICD-10-CM | POA: Diagnosis not present

## 2022-04-04 DIAGNOSIS — N83201 Unspecified ovarian cyst, right side: Secondary | ICD-10-CM | POA: Insufficient documentation

## 2022-04-04 DIAGNOSIS — C787 Secondary malignant neoplasm of liver and intrahepatic bile duct: Secondary | ICD-10-CM | POA: Diagnosis not present

## 2022-04-04 NOTE — Telephone Encounter (Signed)
Spoke with pt to advise that referral has been e-mailed to Dr. Elias Else for therapy and medication management assistance. Patient expressed approval.

## 2022-04-10 ENCOUNTER — Other Ambulatory Visit (HOSPITAL_COMMUNITY): Payer: Self-pay

## 2022-04-15 ENCOUNTER — Other Ambulatory Visit (HOSPITAL_COMMUNITY): Payer: Self-pay

## 2022-04-15 ENCOUNTER — Ambulatory Visit: Admit: 2022-04-15 | Payer: Medicaid Other | Admitting: Obstetrics and Gynecology

## 2022-04-15 SURGERY — LAPAROSCOPY, DIAGNOSTIC
Anesthesia: Choice | Laterality: Right

## 2022-04-17 ENCOUNTER — Other Ambulatory Visit (HOSPITAL_COMMUNITY): Payer: Self-pay

## 2022-04-18 ENCOUNTER — Inpatient Hospital Stay (HOSPITAL_BASED_OUTPATIENT_CLINIC_OR_DEPARTMENT_OTHER): Payer: BC Managed Care – PPO | Admitting: Hematology

## 2022-04-18 ENCOUNTER — Other Ambulatory Visit: Payer: Self-pay

## 2022-04-18 ENCOUNTER — Inpatient Hospital Stay: Payer: BC Managed Care – PPO

## 2022-04-18 ENCOUNTER — Other Ambulatory Visit (HOSPITAL_COMMUNITY): Payer: Self-pay

## 2022-04-18 VITALS — BP 109/73 | HR 75 | Temp 98.2°F | Resp 18 | Ht 68.0 in | Wt 149.4 lb

## 2022-04-18 VITALS — BP 106/72 | HR 67 | Resp 18

## 2022-04-18 DIAGNOSIS — C19 Malignant neoplasm of rectosigmoid junction: Secondary | ICD-10-CM

## 2022-04-18 DIAGNOSIS — Z5111 Encounter for antineoplastic chemotherapy: Secondary | ICD-10-CM | POA: Diagnosis not present

## 2022-04-18 DIAGNOSIS — Z95828 Presence of other vascular implants and grafts: Secondary | ICD-10-CM

## 2022-04-18 LAB — CBC WITH DIFFERENTIAL (CANCER CENTER ONLY)
Abs Immature Granulocytes: 0.01 10*3/uL (ref 0.00–0.07)
Basophils Absolute: 0 10*3/uL (ref 0.0–0.1)
Basophils Relative: 0 %
Eosinophils Absolute: 0 10*3/uL (ref 0.0–0.5)
Eosinophils Relative: 1 %
HCT: 40 % (ref 36.0–46.0)
Hemoglobin: 13.9 g/dL (ref 12.0–15.0)
Immature Granulocytes: 0 %
Lymphocytes Relative: 18 %
Lymphs Abs: 0.8 10*3/uL (ref 0.7–4.0)
MCH: 32.8 pg (ref 26.0–34.0)
MCHC: 34.8 g/dL (ref 30.0–36.0)
MCV: 94.3 fL (ref 80.0–100.0)
Monocytes Absolute: 0.4 10*3/uL (ref 0.1–1.0)
Monocytes Relative: 9 %
Neutro Abs: 3.4 10*3/uL (ref 1.7–7.7)
Neutrophils Relative %: 72 %
Platelet Count: 121 10*3/uL — ABNORMAL LOW (ref 150–400)
RBC: 4.24 MIL/uL (ref 3.87–5.11)
RDW: 14.6 % (ref 11.5–15.5)
WBC Count: 4.7 10*3/uL (ref 4.0–10.5)
nRBC: 0 % (ref 0.0–0.2)

## 2022-04-18 LAB — CMP (CANCER CENTER ONLY)
ALT: 25 U/L (ref 0–44)
AST: 22 U/L (ref 15–41)
Albumin: 4.1 g/dL (ref 3.5–5.0)
Alkaline Phosphatase: 47 U/L (ref 38–126)
Anion gap: 6 (ref 5–15)
BUN: 10 mg/dL (ref 6–20)
CO2: 28 mmol/L (ref 22–32)
Calcium: 9.6 mg/dL (ref 8.9–10.3)
Chloride: 107 mmol/L (ref 98–111)
Creatinine: 0.73 mg/dL (ref 0.44–1.00)
GFR, Estimated: >60 mL/min (ref >60)
Glucose, Bld: 110 mg/dL — ABNORMAL HIGH (ref 70–99)
Potassium: 3.5 mmol/L (ref 3.5–5.1)
Sodium: 141 mmol/L (ref 135–145)
Total Bilirubin: 0.5 mg/dL (ref 0.3–1.2)
Total Protein: 6.4 g/dL — ABNORMAL LOW (ref 6.5–8.1)

## 2022-04-18 LAB — CEA (IN HOUSE-CHCC): CEA (CHCC-In House): 5.22 ng/mL — ABNORMAL HIGH (ref 0.00–5.00)

## 2022-04-18 MED ORDER — ACETAMINOPHEN 325 MG PO TABS
650.0000 mg | ORAL_TABLET | Freq: Once | ORAL | Status: AC
  Administered 2022-04-18: 650 mg via ORAL
  Filled 2022-04-18: qty 2

## 2022-04-18 MED ORDER — TRAMADOL HCL 50 MG PO TABS: 50.0000 mg | ORAL_TABLET | Freq: Four times a day (QID) | ORAL | 0 refills | Status: DC | PRN

## 2022-04-18 MED ORDER — OXALIPLATIN CHEMO INJECTION 100 MG/20ML
100.0000 mg/m2 | Freq: Once | INTRAVENOUS | Status: AC
  Administered 2022-04-18: 175 mg via INTRAVENOUS
  Filled 2022-04-18: qty 35

## 2022-04-18 MED ORDER — CAPECITABINE 500 MG PO TABS
850.0000 mg/m2 | ORAL_TABLET | Freq: Two times a day (BID) | ORAL | 1 refills | Status: DC
  Filled 2022-04-18: qty 84, 14d supply, fill #0

## 2022-04-18 MED ORDER — SODIUM CHLORIDE 0.9% FLUSH
10.0000 mL | Freq: Once | INTRAVENOUS | Status: AC
  Administered 2022-04-18: 10 mL

## 2022-04-18 MED ORDER — DIPHENHYDRAMINE HCL 50 MG/ML IJ SOLN
50.0000 mg | Freq: Once | INTRAMUSCULAR | Status: AC
  Administered 2022-04-18: 50 mg via INTRAVENOUS
  Filled 2022-04-18: qty 1

## 2022-04-18 MED ORDER — FAMOTIDINE IN NACL 20-0.9 MG/50ML-% IV SOLN
20.0000 mg | Freq: Once | INTRAVENOUS | Status: AC
  Administered 2022-04-18: 20 mg via INTRAVENOUS
  Filled 2022-04-18: qty 50

## 2022-04-18 MED ORDER — HEPARIN SOD (PORK) LOCK FLUSH 100 UNIT/ML IV SOLN
500.0000 [IU] | Freq: Once | INTRAVENOUS | Status: AC | PRN
  Administered 2022-04-18: 500 [IU]

## 2022-04-18 MED ORDER — SODIUM CHLORIDE 0.9% FLUSH
10.0000 mL | INTRAVENOUS | Status: DC | PRN
  Administered 2022-04-18: 10 mL

## 2022-04-18 MED ORDER — PALONOSETRON HCL INJECTION 0.25 MG/5ML
0.2500 mg | Freq: Once | INTRAVENOUS | Status: AC
  Administered 2022-04-18: 0.25 mg via INTRAVENOUS
  Filled 2022-04-18: qty 5

## 2022-04-18 MED ORDER — DEXTROSE 5 % IV SOLN: Freq: Once | INTRAVENOUS | Status: AC

## 2022-04-18 MED ORDER — METHYLPREDNISOLONE SODIUM SUCC 125 MG IJ SOLR
125.0000 mg | Freq: Every day | INTRAMUSCULAR | Status: DC
  Administered 2022-04-18: 125 mg via INTRAVENOUS
  Filled 2022-04-18: qty 2

## 2022-04-18 NOTE — Progress Notes (Signed)
[facility]   [phone] [phone]   Clinic Follow up Note   Patient Care Team: [facility] as PCP - General [name], MD as Consulting Physician (Oncology) [name], MD as Consulting Physician (General Surgery) [name], NP as Nurse Practitioner (Nurse Practitioner) Griffin Basil, MD as Consulting Physician (Obstetrics and Gynecology)  Date of Service:  04/18/2022  CHIEF COMPLAINT: f/u of metastatic colon cancer  CURRENT THERAPY:  CAPEOX, q21d, starting 03/28/22  -Xeloda dose: $RemoveBefor'1500mg'[id number]$  BID days 1-14  ASSESSMENT & PLAN:  Stephanie [name] is a 41 y.o. female with   1. 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 She was found to have perforation and positive radial margin. Liver biopsy on 04/16/20 confirmed metastatic disease from her colon cancer -first line chemo with FOLFOXIRI q2 weeks from 05/23/20 - 10/24/20. Bevacizumab added with C2.  -s/p liver resection on 12/13/20 under Dr. Barry Dienes. Pathology showed: metastatic colon carcinoma to liver showing approximately 80% necrosis, resection margin negative.  -s/p colostomy takedown on 05/30/21 with Dr. Barry Dienes. Pathology was benign. -she has been on surveillance. Scans have been NED except for new right ovarian/adnexal cyst in 09/2021 (not seen 07/2021). -she underwent right ovary removal 02/25/22 with Dr. Polly Cobia. Path revealed adenocarcinoma, likely colon origin.  -restaging PET on 03/12/22 showed NED.   -given her high risk of recurrence, she started CAPEOX on 03/28/22. She has tolerated fairly well thus far with the below side effects. -labs reviewed, overall stable. Adequate to proceed with C2 today.  I will slightly increase oxaliplatin dose  to 100 mg/m for this in future cycles.   2.  Chemo side effects: Reflux, nausea with vomiting, fatigue, skin rash, bone pain -s/p C2. She has also developed some skin toxicities from Xeloda.  We discussed using lotion, hydrocortisone and ware comfortable shoes etc,  if this worsens after next cycle, I will decrease her dose. -she has tramadol for bone pain.  I reviewed today.  3. Anxiety and depression -Patient endorses some anxiety and depression with the weight that her diagnosis and how it is changing her "normal".  -We referred her to Dr. Conception Chancy on 04/04/22   4.  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 More so in her feet, but she does note some functional deficits in her fingers. -stable   5. Mild thrombocytopenia -Secondary to chemotherapy -overall stable, 121k today (04/18/22)   6. Genetics  -Genetics consult on 12/04/20, genetic test was drawn 01/17/21 -Results were negative     Plan: -proceed with C2 oxali today with slight dose increase to 100 mg/m2 -continue Xeloda as prescribed, I refilled today -I also refilled tramadol -lab, flush, f/u, and CAPEOX 8/11 and 9/1   No problem-specific Assessment & Plan notes found for this encounter.   SUMMARY OF ONCOLOGIC HISTORY: Oncology History Overview Note  Cancer Staging Malignant neoplasm of rectosigmoid junction [facility]) Staging form: Colon and Rectum, AJCC 8th Edition - Pathologic stage from 04/04/2020: pT4a, pN1b, cM1 - Signed by [name], NP on 05/07/2020    Malignant neoplasm of rectosigmoid junction ([place])  11/10/2019 Imaging   MRI Abdomen  IMPRESSION: 1. Redemonstrated hypoenhancing lesion of the posterior liver dome, hepatic segment VII, reduced in size compared to  prior examination, measuring 1.3 x 1.3 cm, previously 1.8 x 1.7 cm. Findings are consistent with treatment response of a biopsy proven metastasis. No other evidence of  lymphadenopathy or metastatic disease within the abdomen or pelvis. 2. Unchanged mild splenomegaly, maximum coronal span 14.0 cm. 3. 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   03/12/2020 Initial Diagnosis   Malignant neoplasm of rectosigmoid junction ([place])   03/12/2020 Imaging   CT AP with contrast IMPRESSION: 1. Overall findings are highly concerning for colorectal carcinoma involving the sigmoid colon with an associated perforation and adjacent abscess and phlegmon formation as detailed above. Currently, no collection is amenable to percutaneous drainage given their small size and location. 2. New 2 cm mass in the right hepatic lobe concerning for metastatic disease to the liver until proven otherwise. 3. Enlarged regional lymph nodes as detailed above is concerning for nodal metastatic disease. 4. Large stool burden. 5. Prominent pelvic veins which can be seen in patients with pelvic congestion syndrome.   03/13/2020 Imaging   ABD US IMPRESSION: Approximately 2.1 x 2.4 x 2.0 cm lobular homogeneously echogenic lesion in the right hepatic dome corresponds with the abnormality seen on the prior CT scan. Sonographically, this appearance is highly suggestive of a benign hemangioma.   Recommend MRI of the abdomen with gadolinium contrast which may provide a noninvasive diagnosis of benign hemangioma.    03/13/2020 Imaging   MR ABD W/WO CONTRAST Hepatobiliary: Diffuse low signal intensity throughout the hepatic parenchyma on T2 weighted images, presumably a consequence of recent Feraheme injection. In segment 7 of the liver (axial image 8 of series 5) there is a 2.5 x 1.9 cm well-defined lesion which is slightly T2 hyperintense. This lesion appears hyperintense on pre gadolinium T1 weighted images (likely a consequence of Feraheme). Interpretation of enhancement within the lesion is compromised by presence of Feraheme. No other hepatic lesions are  confidently identified on today's examination. No intra or extrahepatic biliary ductal dilatation. Gallbladder is normal in appearance.   03/31/2020 Imaging   CT AP W contrast IMPRESSION: 1. Previously noted sigmoid colon mass appears increased in size, and again appears to be associated with a focal contained perforation which crosses the midline and has fistulized into the left adnexal region where there is now what appears to be a large left tubo-ovarian abscess, as detailed above. This is also associated with multiple enlarged lymph nodes in the pelvis measuring up to 1.2 cm in short axis and borderline enlarged retroperitoneal lymph nodes, concerning for metastatic disease. In addition, previously suspected metastatic lesion in segment 7 of the liver has enlarged. 2. Small volume of ascites. 3. Additional incidental findings, as above.   04/04/2020 Cancer Staging   Staging form: Colon and Rectum, AJCC 8th Edition - Pathologic stage from 04/04/2020: pT4a, pN1b, cM1 - Signed by [name], NP on 05/07/2020   04/04/2020 Procedure   Paracentesis, path showed no malignant cells (mixed acute and chronic inflammation present)   04/04/2020 Surgery   Open sigmoid colectomy and end colostomy by Dr. [name]   04/04/2020 Pathology Results   FINAL MICROSCOPIC DIAGNOSIS: A. COLON, RECTOSIGMOID, RESECTION: - Invasive moderately differentiated adenocarcinoma, 6 cm, involving rectosigmoid junction - Carcinoma invades into serosal surface with perforation and associated serositis - Radial resection margin is positive for carcinoma; proximal and distal margins are not involved - Lymphovascular invasion is present - Metastatic carcinoma to one of fifteen lymph nodes (1/15); one tumor deposit - See oncology table B. LYMPH NODES,  MESENTERIC, RESECTION: - Metastatic adenocarcinoma to one of six lymph nodes (1/6) - One tumor deposit  Addendum to note 2 involved lymph nodes (of 21 examined nodes)  pT4a,pN1b MMR-normal, preserved expression of MLH1, MSH2, MSH6, PMS2   04/12/2020 Procedure   PAC placement    04/13/2020 Imaging   CT chest without contrast IMPRESSION: Interval development of bilateral pleural effusions, left slightly greater than right, with resultant bibasilar atelectasis including subtotal collapse of the left lower lobe. No evidence of intrathoracic metastatic disease, though evaluation of the collapsed parenchyma is limited. Hepatic metastasis again demonstrated.     04/16/2020 Pathology Results   FINAL MICROSCOPIC DIAGNOSIS:  A. LIVER, RIGHT LOBE, BIOPSY:  - Adenocarcinoma.  COMMENT:  The morphology is compatible with the provided clinical history of colorectal carcinoma.    05/23/2020 - 10/24/2020 Chemotherapy   FOLFIRINOX q2weeks for 3-6 months starting 05/23/20. Bevacizumab-bvzr [name] added with C2. Oxaliplatin held with C11-12 due to reaction. (pt developed SOB, chest palpitation and abdominal discomfort shortly after oxaliplatin started). Completed on 10/24/20.   08/05/2020 Imaging   CT AP  IMPRESSION: 1. Postsurgical changes of distal colectomy with a left lower quadrant end ostomy. No evidence of obstruction or acute complication at this time. Excluded rectal pouch in the deep pelvis without acute complication or worrisome features. 2. Slight interval decrease in size of a hypoattenuating lesion posterior right lobe liver measuring 1.7 x 1.8 x 2 cm. This lesion has previously undergone ultrasound-guided biopsy with pathologic results demonstrating adenocarcinoma compatible with metastatic disease from patient's resected colorectal carcinoma. 3. Slight prominence of the parametrial vessels bilaterally, nonspecific though can be seen in the setting of pelvic congestion syndrome. 4. Mild splenomegaly.  No focal lesion.     11/12/2020 Imaging   CT Chest  IMPRESSION: 1. No evidence of metastatic disease in the chest. 2. Known segment 7 right liver  1.3 cm metastasis, stable since recent 11/09/2020 MRI.   12/13/2020 Surgery   A. 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 The report date is 01/28/2021.   The CustomNext-Cancer+RNAinsight panel offered by [facility] included sequencing and rearrangement analysis for the following 47 genes:  APC, ATM, AXIN2, BARD1, BMPR1A, BRCA1, BRCA2, BRIP1, CDH1, CDK4, CDKN2A, CHEK2, DICER1, EPCAM, GREM1, HOXB13, MEN1, MLH1, MSH2, MSH3, MSH6, MUTYH, NBN, NF1, NF2, NTHL1, PALB2, PMS2, POLD1, POLE, PTEN, RAD51C, RAD51D, RECQL, RET, SDHA, SDHAF2, SDHB, SDHC, SDHD, SMAD4, SMARCA4, STK11, TP53, TSC1, TSC2, and VHL.  RNA data is routinely analyzed for use in variant interpretation for all genes.   02/19/2021 Imaging   CT A/P w/o contrast  IMPRESSION: Slightly limited examination examination in absence of contrast administration. Status post partial right hepatectomy. Interval decrease in size in perihepatic fluid collection and resolution of right subdiaphragmatic fluid and gas. No new intra-abdominal fluid collections are identified.   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 Moderate stool throughout the colon without evidence of obstruction.   Fluid distension of the proximal duodenum to the level of the SMA hiatus which appears narrow. 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   05/05/2021 Imaging   CT AP  IMPRESSION: Postsurgical changes as described  stable in appearance from the prior exam.   Changes suggestive of mild pelvic varices.   08/22/2021 Imaging   EXAM: CT ABDOMEN AND PELVIS WITH CONTRAST  IMPRESSION: 1. Extensive bowel content is identified throughout the colon consistent with constipation. 2. 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 3. Stable postoperative changes in the right lobe liver.   09/12/2021 Survivorship   SCP delivered by Cira Rue, NP   10/05/2021 Imaging   EXAM: CT ABDOMEN AND PELVIS WITH CONTRAST  IMPRESSION: 1. Right ovarian/adnexal cyst, 5.2 cm. Because this lesion is not adequately characterized, prompt [place] is recommended for further evaluation. Note: This recommendation does not apply to premenarchal patients and to those with increased risk (genetic, family history, elevated tumor markers or other high-risk factors) of ovarian cancer. Reference: JACR 2020 Feb; 17(2):248-254 2. No other evidence of an acute abnormality within the abdomen or pelvis. 3. Moderate increase in the colonic and rectal stool burden. No bowel obstruction or inflammation. 4. No evidence of locally recurrent or metastatic colon carcinoma.   11/12/2021 Imaging   EXAM: CT CHEST WITHOUT CONTRAST  IMPRESSION: 1. No evidence of pulmonary metastatic disease. 2. Stable postoperative findings about the posterior right lobe of the liver.   03/28/2022 -  Chemotherapy   Patient is on Treatment Plan : COLORECTAL Xelox (Capeox) q21d        INTERVAL HISTORY:  Stephanie [name] is here for a follow up of metastatic colon cancer. She was last seen by PA Cassie on 04/03/22. She presents to the clinic alone. She reports she experienced some eye puffiness, mild cramps, and nausea with vomiting. She also reports bone pain and pain to the bottom of her feet, requiring her to use the tramadol more frequently. She notes she developed a blister on her foot, so the pain was worse in her  off week. She endorses using lotion. She reports some reflux symptoms-- specifically chest pain, which scared her. She notes she felt like she's "been hit by a truck" for a few days after infusion.   All other systems were reviewed with the patient and are negative.  MEDICAL HISTORY:  Past Medical History:  Diagnosis Date   Anxiety    Blood transfusion without reported diagnosis    Bowel obstruction (HCC)    BV (bacterial vaginosis)    Colon cancer ([place])    Colon Cancer 03-2020   Colon polyps    Colostomy present ([place])    03-2020   Family history of bladder cancer    History of chemotherapy    ended 09-2020   Lactose intolerance 03/12/2020   Neuromuscular disorder (HCC)    neuropathy feet hands legs   UTI (lower urinary tract infection)     SURGICAL HISTORY: Past Surgical History:  Procedure Laterality Date   CESAREAN SECTION     CHOLECYSTECTOMY  12/13/2020   COLECTOMY  03/2020   COLONOSCOPY     COLOSTOMY TAKEDOWN N/A 05/30/2021   Procedure: LAPAROSCOPIC ASSISTED COLOSTOMY TAKEDOWN;  Surgeon: [name], MD;  Location: [place] [facility];  Service: General;  Laterality: N/A;   CYSTOSCOPY WITH STENT PLACEMENT  04/04/2020   Procedure: CYSTOSCOPY WITH STENT PLACEMENT;  Surgeon: [name], MD;  Location: [facility];  Service: General;;   LAPAROSCOPIC LIVER ULTRASOUND N/A 12/13/2020   Procedure: INTRAOPERATIVE LIVER ULTRASOUND;  Surgeon: [name], MD;  Location: [facility];  Service: General;  Laterality: N/A;   LAPAROSCOPY N/A 12/13/2020   Procedure: LAPAROSCOPY DIAGNOSTIC;  Surgeon: [name], MD;  Location: [place];  Service: General;  Laterality: N/A;   LAPAROTOMY N/A 04/04/2020   Procedure: EXPLORATORY LAPAROTOMY;  Surgeon: [name], MD;  Location: [facility];  Service: General;  Laterality: N/A;   OPEN PARTIAL HEPATECTOMY  N/A 12/13/2020   Procedure: OPEN PARTIAL HEPATECTOMY;  Surgeon: [name], MD;  Location: [place];  Service: General;  Laterality: N/A;  ROOM 2 STARTING AT  09:30AM FOR 300 MIN   PORTACATH PLACEMENT Right 04/12/2020   Procedure: INSERTION PORT-A-CATH WITH ULTRASOUND;  Surgeon: Kieth Brightly Arta Bruce, MD;  Location: [facility];  Service: General;  Laterality: Right;    I have reviewed the social history and family history with the patient and they are unchanged from previous note.  ALLERGIES:  is allergic to oxaliplatin.  MEDICATIONS:  Current Outpatient Medications  Medication Sig Dispense Refill   acetaminophen (TYLENOL) 325 MG tablet Take 2 tablets (650 mg total) by mouth every 6 (six) hours as needed for mild pain, moderate pain, fever or headache (fever > 101).     AUROVELA 1.5/30 1.5-30 MG-MCG tablet TAKE ONE TABLET BY MOUTH DAILY 21 tablet 6   capecitabine (XELODA) 500 MG tablet Take 3 tablets (1,500 mg total) by mouth 2 (two) times daily after a meal. Take for 14 days on, then off 7 days. Repeat every 21 days. 84 tablet 1   HYDROcodone-acetaminophen (NORCO/VICODIN) 5-325 MG tablet Take 1 tablet by mouth every 6 (six) hours as needed for severe pain. 8 tablet 0   ibuprofen (ADVIL) 600 MG tablet Take 1 tablet (600 mg total) by mouth every 6 (six) hours as needed for headache, mild pain, moderate pain or cramping. 30 tablet 2   Lactic Ac-Citric Ac-Pot Bitart (PHEXXI) 1.8-1-0.4 % GEL Place 5 g vaginally as needed. 60 g 1   lidocaine-prilocaine (EMLA) cream Apply to affected area once 30 g 3   meloxicam (MOBIC) 7.5 MG tablet Take 1 tablet (7.5 mg total) by mouth daily. 20 tablet 0   ondansetron (ZOFRAN) 8 MG tablet Take 1 tablet (8 mg total) by mouth every 8 (eight) hours as needed for nausea or vomiting. 30 tablet 0   Prenatal Vit-Fe Fumarate-FA (PRENATAL MULTIVITAMIN) TABS tablet Take 1 tablet by mouth daily at 12 noon.     prochlorperazine (COMPAZINE) 10 MG tablet Take 1 tablet (10 mg total) by mouth every 6 (six) hours as needed (Nausea or vomiting). 30 tablet 1   sulfamethoxazole-trimethoprim (BACTRIM DS) 800-160 MG tablet Take 1 tablet by mouth  2 (two) times daily.     SUTAB [phone] MG TABS Take by mouth.     topiramate (TOPAMAX) 50 MG tablet Take 50 mg by mouth daily.     traMADol (ULTRAM) 50 MG tablet Take 1 tablet (50 mg total) by mouth every 6 (six) hours as needed. 30 tablet 0   No current facility-administered medications for this visit.   Facility-Administered Medications Ordered in Other Visits  Medication Dose Route Frequency Provider Last Rate Last Admin   heparin lock flush 100 unit/mL  500 Units Intracatheter Once PRN [name], MD       methylPREDNISolone sodium succinate (SOLU-MEDROL) 125 mg/2 mL injection 125 mg  125 mg Intravenous Daily [name], MD   125 mg at 04/18/22 1202   oxaliplatin (ELOXATIN) 175 mg in dextrose 5 % 500 mL chemo infusion  100 mg/m2 (Treatment Plan Recorded) Intravenous Once [name], MD [phone]  mL/hr at 04/18/22 1322 175 mg at 04/18/22 1322   sodium chloride flush (NS) 0.9 % injection 10 mL  10 mL Intracatheter PRN [name], MD        PHYSICAL EXAMINATION: ECOG PERFORMANCE STATUS: 1 - Symptomatic but completely ambulatory  Vitals:   04/18/22 1103  BP: 109/73  Pulse: 75  Resp: 18  Temp: 98.2 F (36.8 C)  SpO2: 100%   Wt Readings from Last 3 Encounters:  04/18/22 149 lb 6.4 oz (67.8 kg)  03/28/22 149 lb 1.6 oz (67.6 kg)  03/14/22 142 lb 14.4 oz (64.8 kg)     GENERAL:alert, no distress and comfortable SKIN: skin color normal, no significant lesions, (+) mild rash to bottom of feet EYES: normal, Conjunctiva are pink and non-injected, sclera clear  NEURO: alert & oriented x 3 with fluent speech  LABORATORY DATA:  I have reviewed the data as listed    Latest Ref Rng & Units 04/18/2022   10:55 AM 03/28/2022   10:40 AM 03/12/2022   11:21 AM  CBC  WBC 4.0 - 10.5 K/uL 4.7  4.5  7.3   Hemoglobin 12.0 - 15.0 g/dL 13.9  14.4  15.0   Hematocrit 36.0 - 46.0 % 40.0  42.2  42.9   Platelets 150 - 400 K/uL 121  119  129         Latest Ref Rng & Units 04/18/2022   10:55 AM 03/28/2022    10:40 AM 03/12/2022   11:21 AM  CMP  Glucose 70 - 99 mg/dL 110  97  111   BUN 6 - 20 mg/dL $Remove'10  13  13   'GXmGhIr$ Creatinine 0.44 - 1.00 mg/dL 0.73  0.67  0.69   Sodium 135 - 145 mmol/L 141  140  138   Potassium 3.5 - 5.1 mmol/L 3.5  3.9  3.8   Chloride 98 - 111 mmol/L 107  107  106   CO2 22 - 32 mmol/L $RemoveB'28  28  26   'tmLZdLln$ Calcium 8.9 - 10.3 mg/dL 9.6  9.5  9.7   Total Protein 6.5 - 8.1 g/dL 6.4  7.0  7.3   Total Bilirubin 0.3 - 1.2 mg/dL 0.5  0.3  0.5   Alkaline Phos 38 - 126 U/L 47  58  58   AST 15 - 41 U/L $Remo'22  20  17   'ZcLLK$ ALT 0 - 44 U/L $Remo'25  27  21       'FUOpb$ RADIOGRAPHIC STUDIES: I have personally reviewed the radiological images as listed and agreed with the findings in the report. No results found.    No orders of the defined types were placed in this encounter.  All questions were answered. The patient knows to call the clinic with any problems, questions or concerns. No barriers to learning was detected. The total time spent in the appointment was 30 minutes.     [name], MD 04/18/2022   I, Wilburn Mylar, am acting as scribe for [name], MD.   I have reviewed the above documentation for accuracy and completeness, and I agree with the above.

## 2022-04-21 ENCOUNTER — Other Ambulatory Visit: Payer: Self-pay

## 2022-04-23 ENCOUNTER — Other Ambulatory Visit: Payer: Self-pay

## 2022-04-26 ENCOUNTER — Other Ambulatory Visit: Payer: Self-pay

## 2022-04-28 ENCOUNTER — Telehealth: Payer: Self-pay

## 2022-04-28 NOTE — Telephone Encounter (Signed)
Pt called requesting she wants something prescribed for bacterial vaginosis which she got the last time she had chemotherapy.  Pt stated feels that she's having it again this time around with chemotherapy.  Pt wants Dr. Burr Medico to prescribe something or give her a call.  Notified Dr. Burr Medico of pt's call.

## 2022-04-29 ENCOUNTER — Other Ambulatory Visit: Payer: Self-pay

## 2022-04-29 ENCOUNTER — Other Ambulatory Visit: Payer: Self-pay | Admitting: Hematology

## 2022-04-29 MED ORDER — METRONIDAZOLE 500 MG PO TABS: 500.0000 mg | ORAL_TABLET | Freq: Three times a day (TID) | ORAL | 0 refills | Status: DC

## 2022-04-30 ENCOUNTER — Other Ambulatory Visit (HOSPITAL_COMMUNITY): Payer: Self-pay

## 2022-05-01 ENCOUNTER — Other Ambulatory Visit: Payer: Self-pay | Admitting: Hematology

## 2022-05-01 ENCOUNTER — Telehealth: Payer: Self-pay

## 2022-05-01 DIAGNOSIS — N83201 Unspecified ovarian cyst, right side: Secondary | ICD-10-CM

## 2022-05-01 MED ORDER — HYDROCODONE-ACETAMINOPHEN 5-325 MG PO TABS: 1.0000 | ORAL_TABLET | Freq: Four times a day (QID) | ORAL | 0 refills | Status: DC | PRN

## 2022-05-01 MED ORDER — UREA 10 % EX CREA
TOPICAL_CREAM | Freq: Two times a day (BID) | CUTANEOUS | 0 refills | Status: DC
Start: 2022-05-01 — End: 2023-01-21

## 2022-05-01 NOTE — Telephone Encounter (Signed)
T/C from pt stating she is having extreme pain in her hands and feet.  They are extremely swollen and blistering and she can barely walk.  Pain meds are not helping.  Can you call in something topical?  Please advise

## 2022-05-02 ENCOUNTER — Other Ambulatory Visit: Payer: Self-pay

## 2022-05-04 ENCOUNTER — Other Ambulatory Visit: Payer: Self-pay

## 2022-05-05 ENCOUNTER — Other Ambulatory Visit (HOSPITAL_COMMUNITY): Payer: Self-pay

## 2022-05-06 ENCOUNTER — Other Ambulatory Visit (HOSPITAL_COMMUNITY): Payer: Self-pay

## 2022-05-09 ENCOUNTER — Inpatient Hospital Stay (HOSPITAL_BASED_OUTPATIENT_CLINIC_OR_DEPARTMENT_OTHER): Payer: BC Managed Care – PPO | Admitting: Hematology

## 2022-05-09 ENCOUNTER — Other Ambulatory Visit: Payer: Self-pay

## 2022-05-09 ENCOUNTER — Telehealth: Payer: Self-pay | Admitting: Hematology

## 2022-05-09 ENCOUNTER — Inpatient Hospital Stay: Payer: BC Managed Care – PPO

## 2022-05-09 ENCOUNTER — Other Ambulatory Visit (HOSPITAL_COMMUNITY): Payer: Self-pay

## 2022-05-09 ENCOUNTER — Encounter: Payer: Self-pay | Admitting: Hematology

## 2022-05-09 ENCOUNTER — Inpatient Hospital Stay: Payer: BC Managed Care – PPO | Attending: Nurse Practitioner

## 2022-05-09 VITALS — BP 119/74 | HR 79 | Temp 98.0°F | Resp 16 | Wt 154.6 lb

## 2022-05-09 DIAGNOSIS — C19 Malignant neoplasm of rectosigmoid junction: Secondary | ICD-10-CM

## 2022-05-09 DIAGNOSIS — C787 Secondary malignant neoplasm of liver and intrahepatic bile duct: Secondary | ICD-10-CM | POA: Insufficient documentation

## 2022-05-09 DIAGNOSIS — Z5111 Encounter for antineoplastic chemotherapy: Secondary | ICD-10-CM | POA: Insufficient documentation

## 2022-05-09 DIAGNOSIS — Z8052 Family history of malignant neoplasm of bladder: Secondary | ICD-10-CM | POA: Insufficient documentation

## 2022-05-09 DIAGNOSIS — K219 Gastro-esophageal reflux disease without esophagitis: Secondary | ICD-10-CM | POA: Insufficient documentation

## 2022-05-09 DIAGNOSIS — R112 Nausea with vomiting, unspecified: Secondary | ICD-10-CM | POA: Diagnosis not present

## 2022-05-09 DIAGNOSIS — R21 Rash and other nonspecific skin eruption: Secondary | ICD-10-CM | POA: Diagnosis not present

## 2022-05-09 DIAGNOSIS — D696 Thrombocytopenia, unspecified: Secondary | ICD-10-CM | POA: Insufficient documentation

## 2022-05-09 DIAGNOSIS — C7961 Secondary malignant neoplasm of right ovary: Secondary | ICD-10-CM | POA: Diagnosis not present

## 2022-05-09 DIAGNOSIS — G620 Drug-induced polyneuropathy: Secondary | ICD-10-CM | POA: Diagnosis not present

## 2022-05-09 DIAGNOSIS — R970 Elevated carcinoembryonic antigen [CEA]: Secondary | ICD-10-CM | POA: Insufficient documentation

## 2022-05-09 LAB — CMP (CANCER CENTER ONLY)
ALT: 56 U/L — ABNORMAL HIGH (ref 0–44)
AST: 43 U/L — ABNORMAL HIGH (ref 15–41)
Albumin: 4.3 g/dL (ref 3.5–5.0)
Alkaline Phosphatase: 65 U/L (ref 38–126)
Anion gap: 6 (ref 5–15)
BUN: 12 mg/dL (ref 6–20)
CO2: 26 mmol/L (ref 22–32)
Calcium: 9 mg/dL (ref 8.9–10.3)
Chloride: 107 mmol/L (ref 98–111)
Creatinine: 0.73 mg/dL (ref 0.44–1.00)
GFR, Estimated: >60 mL/min (ref >60)
Glucose, Bld: 123 mg/dL — ABNORMAL HIGH (ref 70–99)
Potassium: 3.7 mmol/L (ref 3.5–5.1)
Sodium: 139 mmol/L (ref 135–145)
Total Bilirubin: 0.9 mg/dL (ref 0.3–1.2)
Total Protein: 6.9 g/dL (ref 6.5–8.1)

## 2022-05-09 LAB — CBC WITH DIFFERENTIAL (CANCER CENTER ONLY)
Abs Immature Granulocytes: 0.01 10*3/uL (ref 0.00–0.07)
Basophils Absolute: 0 10*3/uL (ref 0.0–0.1)
Basophils Relative: 1 %
Eosinophils Absolute: 0.1 10*3/uL (ref 0.0–0.5)
Eosinophils Relative: 2 %
HCT: 42.4 % (ref 36.0–46.0)
Hemoglobin: 15 g/dL (ref 12.0–15.0)
Immature Granulocytes: 0 %
Lymphocytes Relative: 34 %
Lymphs Abs: 1.1 10*3/uL (ref 0.7–4.0)
MCH: 33.8 pg (ref 26.0–34.0)
MCHC: 35.4 g/dL (ref 30.0–36.0)
MCV: 95.5 fL (ref 80.0–100.0)
Monocytes Absolute: 0.4 10*3/uL (ref 0.1–1.0)
Monocytes Relative: 12 %
Neutro Abs: 1.6 10*3/uL — ABNORMAL LOW (ref 1.7–7.7)
Neutrophils Relative %: 51 %
Platelet Count: 117 10*3/uL — ABNORMAL LOW (ref 150–400)
RBC: 4.44 MIL/uL (ref 3.87–5.11)
RDW: 17.5 % — ABNORMAL HIGH (ref 11.5–15.5)
WBC Count: 3.1 10*3/uL — ABNORMAL LOW (ref 4.0–10.5)
nRBC: 0 % (ref 0.0–0.2)

## 2022-05-09 LAB — CEA (IN HOUSE-CHCC): CEA (CHCC-In House): 5.72 ng/mL — ABNORMAL HIGH (ref 0.00–5.00)

## 2022-05-09 NOTE — Progress Notes (Signed)
[facility]   [phone] [phone]   Clinic Follow up Note   Patient Care Team: [facility], [facility] as PCP - General [name], MD as Consulting Physician (Oncology) [name], MD as Consulting Physician (General Surgery) [name], NP as Nurse Practitioner (Nurse Practitioner) Griffin Basil, MD as Consulting Physician (Obstetrics and Gynecology)  Date of Service:  05/09/2022  CHIEF COMPLAINT: f/u of metastatic colon cancer  CURRENT THERAPY:  CAPEOX, q21d, starting 03/28/22             -Xeloda dose: $RemoveBefor'1500mg'JgNstIrSxRTa$  BID days 1-14  ASSESSMENT & PLAN:  Stephanie [name] is a 41 y.o. female with   1. Social Support, Anxiety and Depression -she is going through a lot of overwhelming social situations, including financial concerns. -we will have a social worker talk to her today. -we will also hold treatment  and delay until 8/21 to give her time to get things settled.  2. 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 She was found to have perforation and positive radial margin. Liver biopsy on 04/16/20 confirmed metastatic disease from her colon cancer -first line chemo with FOLFOXIRI q2 weeks from 05/23/20 - 10/24/20. Bevacizumab added with C2.  -s/p liver resection on 12/13/20 under Dr. Barry Dienes. Pathology showed: metastatic colon carcinoma to liver showing approximately 80% necrosis, resection margin negative.  -s/p colostomy takedown on 05/30/21 with Dr. Barry Dienes. Pathology was benign. -she has been on surveillance. Scans have been NED except for new right ovarian/adnexal cyst in 09/2021 (not seen 07/2021). -she underwent right ovary removal 02/25/22 with Dr. Polly Cobia. Path revealed adenocarcinoma, likely colon  origin.  -restaging PET on 03/12/22 showed NED.   -given her high risk of recurrence, she started CAPEOX on 03/28/22. She has tolerated fairly well over all but developed more skin toxicity after cycle 2  -her most recent CEA on 04/18/22 was slightly elevated to 5.22. She expressed concern about this. I discussed that this could be nonspecific and we have scheduled CT scan for further evaluation  -labs reviewed, WBC 3.1.  -she is scheduled for restaging CT CAP on 05/13/22   3. Chemo side effects: Reflux, nausea with vomiting, fatigue, skin rash, bone pain -she has tramadol for bone pain.   4.  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 More so in her feet, but she does note some functional deficits in her fingers. -stable   5. Mild thrombocytopenia -Secondary to chemotherapy -overall stable, 117k today (05/09/22)   6. Genetics  -Genetics consult on 12/04/20, genetic test was drawn 01/17/21. Results were negative     Plan: -hold chemo today due to her social stress  -lab, flush, f/u on 8/18 and postpone cycle 3 chemo CAPEOX to 8/21, will reduce Xeloda to $RemoveB'1500mg'eBjYKcag$  am and $Remo'1000mg'wIsjG$  pm from next cycle    No problem-specific Assessment & Plan notes found for this encounter.   SUMMARY OF ONCOLOGIC HISTORY: Oncology History Overview Note  Cancer Staging Malignant neoplasm of rectosigmoid junction [facility]) Staging form: Colon and Rectum, AJCC 8th Edition - Pathologic stage from 04/04/2020: pT4a, pN1b, cM1 - Signed by [name], NP on 05/07/2020    Malignant neoplasm of rectosigmoid junction ([place])  11/10/2019 Imaging   MRI Abdomen  IMPRESSION: 1.  Redemonstrated hypoenhancing lesion of the posterior liver dome, hepatic segment VII, reduced in size compared to prior examination, measuring 1.3 x 1.3 cm, previously 1.8 x 1.7 cm. Findings are consistent with treatment response of a biopsy proven metastasis. No other evidence of  lymphadenopathy or metastatic disease within the abdomen or pelvis. 2. Unchanged mild splenomegaly, maximum coronal span 14.0 cm. 3. 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   03/12/2020 Initial Diagnosis   Malignant neoplasm of rectosigmoid junction ([place])   03/12/2020 Imaging   CT AP with contrast IMPRESSION: 1. Overall findings are highly concerning for colorectal carcinoma involving the sigmoid colon with an associated perforation and adjacent abscess and phlegmon formation as detailed above. Currently, no collection is amenable to percutaneous drainage given their small size and location. 2. New 2 cm mass in the right hepatic lobe concerning for metastatic disease to the liver until proven otherwise. 3. Enlarged regional lymph nodes as detailed above is concerning for nodal metastatic disease. 4. Large stool burden. 5. Prominent pelvic veins which can be seen in patients with pelvic congestion syndrome.   03/13/2020 Imaging   ABD US IMPRESSION: Approximately 2.1 x 2.4 x 2.0 cm lobular homogeneously echogenic lesion in the right hepatic dome corresponds with the abnormality seen on the prior CT scan. Sonographically, this appearance is highly suggestive of a benign hemangioma.   Recommend MRI of the abdomen with gadolinium contrast which may provide a noninvasive diagnosis of benign hemangioma.    03/13/2020 Imaging   MR ABD W/WO CONTRAST Hepatobiliary: Diffuse low signal intensity throughout the hepatic parenchyma on T2 weighted images, presumably a consequence of recent Feraheme injection. In segment 7 of the liver (axial image 8 of series 5) there is a 2.5 x 1.9 cm well-defined lesion which is slightly T2 hyperintense. This lesion appears hyperintense on pre gadolinium T1 weighted images (likely a consequence of Feraheme). Interpretation of enhancement within the lesion is compromised by presence of Feraheme. No other hepatic lesions are  confidently identified on today's examination. No intra or extrahepatic biliary ductal dilatation. Gallbladder is normal in appearance.   03/31/2020 Imaging   CT AP W contrast IMPRESSION: 1. Previously noted sigmoid colon mass appears increased in size, and again appears to be associated with a focal contained perforation which crosses the midline and has fistulized into the left adnexal region where there is now what appears to be a large left tubo-ovarian abscess, as detailed above. This is also associated with multiple enlarged lymph nodes in the pelvis measuring up to 1.2 cm in short axis and borderline enlarged retroperitoneal lymph nodes, concerning for metastatic disease. In addition, previously suspected metastatic lesion in segment 7 of the liver has enlarged. 2. Small volume of ascites. 3. Additional incidental findings, as above.   04/04/2020 Cancer Staging   Staging form: Colon and Rectum, AJCC 8th Edition - Pathologic stage from 04/04/2020: pT4a, pN1b, cM1 - Signed by [name], NP on 05/07/2020   04/04/2020 Procedure   Paracentesis, path showed no malignant cells (mixed acute and chronic inflammation present)   04/04/2020 Surgery   Open sigmoid colectomy and end colostomy by Dr. [name]   04/04/2020 Pathology Results   FINAL MICROSCOPIC DIAGNOSIS: A. COLON, RECTOSIGMOID, RESECTION: - Invasive moderately differentiated adenocarcinoma, 6 cm, involving rectosigmoid junction - Carcinoma invades into serosal surface with perforation and associated serositis - Radial resection margin is positive for carcinoma; proximal and distal margins are not involved - Lymphovascular invasion is present - Metastatic carcinoma to  one of fifteen lymph nodes (1/15); one tumor deposit - See oncology table B. LYMPH NODES, MESENTERIC, RESECTION: - Metastatic adenocarcinoma to one of six lymph nodes (1/6) - One tumor deposit  Addendum to note 2 involved lymph nodes (of 21 examined nodes)  pT4a,pN1b MMR-normal, preserved expression of MLH1, MSH2, MSH6, PMS2   04/12/2020 Procedure   PAC placement    04/13/2020 Imaging   CT chest without contrast IMPRESSION: Interval development of bilateral pleural effusions, left slightly greater than right, with resultant bibasilar atelectasis including subtotal collapse of the left lower lobe. No evidence of intrathoracic metastatic disease, though evaluation of the collapsed parenchyma is limited. Hepatic metastasis again demonstrated.     04/16/2020 Pathology Results   FINAL MICROSCOPIC DIAGNOSIS:  A. LIVER, RIGHT LOBE, BIOPSY:  - Adenocarcinoma.  COMMENT:  The morphology is compatible with the provided clinical history of colorectal carcinoma.    05/23/2020 - 10/24/2020 Chemotherapy   FOLFIRINOX q2weeks for 3-6 months starting 05/23/20. Bevacizumab-bvzr [name] added with C2. Oxaliplatin held with C11-12 due to reaction. (pt developed SOB, chest palpitation and abdominal discomfort shortly after oxaliplatin started). Completed on 10/24/20.   08/05/2020 Imaging   CT AP  IMPRESSION: 1. Postsurgical changes of distal colectomy with a left lower quadrant end ostomy. No evidence of obstruction or acute complication at this time. Excluded rectal pouch in the deep pelvis without acute complication or worrisome features. 2. Slight interval decrease in size of a hypoattenuating lesion posterior right lobe liver measuring 1.7 x 1.8 x 2 cm. This lesion has previously undergone ultrasound-guided biopsy with pathologic results demonstrating adenocarcinoma compatible with metastatic disease from patient's resected colorectal carcinoma. 3. Slight prominence of the parametrial vessels bilaterally, nonspecific though can be seen in the setting of pelvic congestion syndrome. 4. Mild splenomegaly.  No focal lesion.     11/12/2020 Imaging   CT Chest  IMPRESSION: 1. No evidence of metastatic disease in the chest. 2. Known segment 7 right liver  1.3 cm metastasis, stable since recent 11/09/2020 MRI.   12/13/2020 Surgery   A. 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 The report date is 01/28/2021.   The CustomNext-Cancer+RNAinsight panel offered by [facility] included sequencing and rearrangement analysis for the following 47 genes:  APC, ATM, AXIN2, BARD1, BMPR1A, BRCA1, BRCA2, BRIP1, CDH1, CDK4, CDKN2A, CHEK2, DICER1, EPCAM, GREM1, HOXB13, MEN1, MLH1, MSH2, MSH3, MSH6, MUTYH, NBN, NF1, NF2, NTHL1, PALB2, PMS2, POLD1, POLE, PTEN, RAD51C, RAD51D, RECQL, RET, SDHA, SDHAF2, SDHB, SDHC, SDHD, SMAD4, SMARCA4, STK11, TP53, TSC1, TSC2, and VHL.  RNA data is routinely analyzed for use in variant interpretation for all genes.   02/19/2021 Imaging   CT A/P w/o contrast  IMPRESSION: Slightly limited examination examination in absence of contrast administration. Status post partial right hepatectomy. Interval decrease in size in perihepatic fluid collection and resolution of right subdiaphragmatic fluid and gas. No new intra-abdominal fluid collections are identified.   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 Moderate stool throughout the colon without evidence of obstruction.   Fluid distension of the proximal duodenum to the level of the SMA hiatus which appears narrow. 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   05/05/2021 Imaging   CT AP  IMPRESSION: Postsurgical changes as described  stable in appearance from the prior exam.   Changes suggestive of mild pelvic varices.   08/22/2021 Imaging   EXAM: CT ABDOMEN AND PELVIS WITH CONTRAST  IMPRESSION: 1. Extensive bowel content is identified throughout the colon consistent with constipation. 2. 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 3. Stable postoperative changes in the right lobe liver.   09/12/2021 Survivorship   SCP delivered by Cira Rue, NP   10/05/2021 Imaging   EXAM: CT ABDOMEN AND PELVIS WITH CONTRAST  IMPRESSION: 1. Right ovarian/adnexal cyst, 5.2 cm. Because this lesion is not adequately characterized, prompt [place] is recommended for further evaluation. Note: This recommendation does not apply to premenarchal patients and to those with increased risk (genetic, family history, elevated tumor markers or other high-risk factors) of ovarian cancer. Reference: JACR 2020 Feb; 17(2):248-254 2. No other evidence of an acute abnormality within the abdomen or pelvis. 3. Moderate increase in the colonic and rectal stool burden. No bowel obstruction or inflammation. 4. No evidence of locally recurrent or metastatic colon carcinoma.   11/12/2021 Imaging   EXAM: CT CHEST WITHOUT CONTRAST  IMPRESSION: 1. No evidence of pulmonary metastatic disease. 2. Stable postoperative findings about the posterior right lobe of the liver.   03/28/2022 -  Chemotherapy   Patient is on Treatment Plan : COLORECTAL Xelox (Capeox) q21d        INTERVAL HISTORY:  Stephanie [name] is here for a follow up of metastatic colon cancer. She was last seen by me on 04/18/22. She presents to the clinic alone. She is emotional today. She explains she has a lot of stress going on at home. In short, it includes issues with her daughter's father, lack of funds (even for food), and housing issues.   All other systems were reviewed with the patient and are negative.  MEDICAL HISTORY:   Past Medical History:  Diagnosis Date   Anxiety    Blood transfusion without reported diagnosis    Bowel obstruction (HCC)    BV (bacterial vaginosis)    Colon cancer ([place])    Colon Cancer 03-2020   Colon polyps    Colostomy present [facility])    03-2020   Family history of bladder cancer    History of chemotherapy    ended 09-2020   Lactose intolerance 03/12/2020   Neuromuscular disorder (HCC)    neuropathy feet hands legs   UTI (lower urinary tract infection)     SURGICAL HISTORY: Past Surgical History:  Procedure Laterality Date   CESAREAN SECTION     CHOLECYSTECTOMY  12/13/2020   COLECTOMY  03/2020   COLONOSCOPY     COLOSTOMY TAKEDOWN N/A 05/30/2021   Procedure: LAPAROSCOPIC ASSISTED COLOSTOMY TAKEDOWN;  Surgeon: [name], MD;  Location: [place];  Service: General;  Laterality: N/A;   CYSTOSCOPY WITH STENT PLACEMENT  04/04/2020   Procedure: CYSTOSCOPY WITH STENT PLACEMENT;  Surgeon: [name], MD;  Location: [facility];  Service: General;;   LAPAROSCOPIC LIVER ULTRASOUND N/A 12/13/2020   Procedure: INTRAOPERATIVE LIVER ULTRASOUND;  Surgeon: [name], MD;  Location: [place];  Service: General;  Laterality: N/A;   LAPAROSCOPY N/A 12/13/2020   Procedure: LAPAROSCOPY DIAGNOSTIC;  Surgeon: [name], MD;  Location: [place];  Service: General;  Laterality: N/A;   LAPAROTOMY N/A 04/04/2020   Procedure:  EXPLORATORY LAPAROTOMY;  Surgeon: [name], MD;  Location: [facility];  Service: General;  Laterality: N/A;   OPEN PARTIAL HEPATECTOMY  N/A 12/13/2020   Procedure: OPEN PARTIAL HEPATECTOMY;  Surgeon: [name], MD;  Location: [place];  Service: General;  Laterality: N/A;  ROOM 2 STARTING AT 09:30AM FOR 300 MIN   PORTACATH PLACEMENT Right 04/12/2020   Procedure: INSERTION PORT-A-CATH WITH ULTRASOUND;  Surgeon: Mickeal Skinner, MD;  Location: [facility];  Service: General;  Laterality: Right;    I have reviewed the social history and family history with the patient and they  are unchanged from previous note.  ALLERGIES:  is allergic to oxaliplatin.  MEDICATIONS:  Current Outpatient Medications  Medication Sig Dispense Refill   acetaminophen (TYLENOL) 325 MG tablet Take 2 tablets (650 mg total) by mouth every 6 (six) hours as needed for mild pain, moderate pain, fever or headache (fever > 101).     AUROVELA 1.5/30 1.5-30 MG-MCG tablet TAKE ONE TABLET BY MOUTH DAILY 21 tablet 6   capecitabine (XELODA) 500 MG tablet Take 3 tablets (1,500 mg total) by mouth 2 (two) times daily after a meal. Take for 14 days on, then off 7 days. Repeat every 21 days. 84 tablet 1   HYDROcodone-acetaminophen (NORCO/VICODIN) 5-325 MG tablet Take 1 tablet by mouth every 6 (six) hours as needed for severe pain. 10 tablet 0   ibuprofen (ADVIL) 600 MG tablet Take 1 tablet (600 mg total) by mouth every 6 (six) hours as needed for headache, mild pain, moderate pain or cramping. 30 tablet 2   Lactic Ac-Citric Ac-Pot Bitart (PHEXXI) 1.8-1-0.4 % GEL Place 5 g vaginally as needed. 60 g 1   lidocaine-prilocaine (EMLA) cream Apply to affected area once 30 g 3   meloxicam (MOBIC) 7.5 MG tablet Take 1 tablet (7.5 mg total) by mouth daily. 20 tablet 0   metroNIDAZOLE (FLAGYL) 500 MG tablet Take 1 tablet (500 mg total) by mouth 3 (three) times daily. 21 tablet 0   ondansetron (ZOFRAN) 8 MG tablet Take 1 tablet (8 mg total) by mouth every 8 (eight) hours as needed for nausea or vomiting. 30 tablet 0   Prenatal Vit-Fe Fumarate-FA (PRENATAL MULTIVITAMIN) TABS tablet Take 1 tablet by mouth daily at 12 noon.     prochlorperazine (COMPAZINE) 10 MG tablet Take 1 tablet (10 mg total) by mouth every 6 (six) hours as needed (Nausea or vomiting). 30 tablet 1   sulfamethoxazole-trimethoprim (BACTRIM DS) 800-160 MG tablet Take 1 tablet by mouth 2 (two) times daily.     SUTAB [phone] MG TABS Take by mouth.     topiramate (TOPAMAX) 50 MG tablet Take 50 mg by mouth daily.     traMADol (ULTRAM) 50 MG tablet Take 1  tablet (50 mg total) by mouth every 6 (six) hours as needed. 30 tablet 0   urea (CARMOL) 10 % cream Apply topically 2 (two) times daily. 71 g 0   No current facility-administered medications for this visit.    PHYSICAL EXAMINATION: ECOG PERFORMANCE STATUS: 1 - Symptomatic but completely ambulatory  Vitals:   05/09/22 1034  BP: 119/74  Pulse: 79  Resp: 16  Temp: 98 F (36.7 C)  SpO2: 100%   Wt Readings from Last 3 Encounters:  05/09/22 154 lb 9.6 oz (70.1 kg)  04/18/22 149 lb 6.4 oz (67.8 kg)  03/28/22 149 lb 1.6 oz (67.6 kg)     GENERAL:alert, comfortable, (+) tearful, anxious SKIN: skin color normal, no rashes or significant lesions  EYES: normal, Conjunctiva are pink and non-injected, sclera clear  NEURO: alert & oriented x 3 with fluent speech  LABORATORY DATA:  I have reviewed the data as listed    Latest Ref Rng & Units 05/09/2022   10:19 AM 04/18/2022   10:55 AM 03/28/2022   10:40 AM  CBC  WBC 4.0 - 10.5 K/uL 3.1  4.7  4.5   Hemoglobin 12.0 - 15.0 g/dL 15.0  13.9  14.4   Hematocrit 36.0 - 46.0 % 42.4  40.0  42.2   Platelets 150 - 400 K/uL 117  121  119         Latest Ref Rng & Units 05/09/2022   10:19 AM 04/18/2022   10:55 AM 03/28/2022   10:40 AM  CMP  Glucose 70 - 99 mg/dL 123  110  97   BUN 6 - 20 mg/dL $Remove'12  10  13   'TKsPRoS$ Creatinine 0.44 - 1.00 mg/dL 0.73  0.73  0.67   Sodium 135 - 145 mmol/L 139  141  140   Potassium 3.5 - 5.1 mmol/L 3.7  3.5  3.9   Chloride 98 - 111 mmol/L 107  107  107   CO2 22 - 32 mmol/L $RemoveB'26  28  28   'IjweBmoL$ Calcium 8.9 - 10.3 mg/dL 9.0  9.6  9.5   Total Protein 6.5 - 8.1 g/dL 6.9  6.4  7.0   Total Bilirubin 0.3 - 1.2 mg/dL 0.9  0.5  0.3   Alkaline Phos 38 - 126 U/L 65  47  58   AST 15 - 41 U/L 43  22  20   ALT 0 - 44 U/L 56  25  27       RADIOGRAPHIC STUDIES: I have personally reviewed the radiological images as listed and agreed with the findings in the report. No results found.    No orders of the defined types were placed in this  encounter.  All questions were answered. The patient knows to call the clinic with any problems, questions or concerns. No barriers to learning was detected. The total time spent in the appointment was 30 minutes.     [name], MD 05/09/2022   I, Wilburn Mylar, am acting as scribe for [name], MD.   I have reviewed the above documentation for accuracy and completeness, and I agree with the above.

## 2022-05-09 NOTE — Telephone Encounter (Signed)
Left message with rescheduled upcoming appointment per 8/11 los.

## 2022-05-09 NOTE — Progress Notes (Signed)
Received call from patient while at cancer center, very emotional and wanting to postpone treatment due to lack of resources for personal expenses such as food, bill, etc. Advised patient I would reach out to social workers for support today and follow up after emergent needs have been met from them. She verbalized understanding.  Staff message sent to social worker team and provider.  Patient has my card for any other financial questions or concerns.
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
## 2022-05-12 ENCOUNTER — Other Ambulatory Visit: Payer: Medicaid Other

## 2022-05-12 ENCOUNTER — Other Ambulatory Visit: Payer: Self-pay

## 2022-05-13 ENCOUNTER — Ambulatory Visit (HOSPITAL_COMMUNITY)
Admission: RE | Admit: 2022-05-13 | Discharge: 2022-05-13 | Disposition: A | Discharge status: Home / Self Care | Payer: BC Managed Care – PPO | Source: Ambulatory Visit | Attending: Hematology | Admitting: Hematology

## 2022-05-13 ENCOUNTER — Encounter (HOSPITAL_COMMUNITY): Payer: Self-pay

## 2022-05-13 DIAGNOSIS — C19 Malignant neoplasm of rectosigmoid junction: Secondary | ICD-10-CM | POA: Diagnosis present

## 2022-05-13 MED ORDER — IOHEXOL 300 MG/ML  SOLN
100.0000 mL | Freq: Once | INTRAMUSCULAR | Status: AC | PRN
  Administered 2022-05-13: 100 mL via INTRAVENOUS

## 2022-05-13 MED ORDER — SODIUM CHLORIDE (PF) 0.9 % IJ SOLN
INTRAMUSCULAR | Status: AC
  Filled 2022-05-13: qty 50

## 2022-05-15 ENCOUNTER — Ambulatory Visit: Payer: Medicaid Other | Admitting: Hematology

## 2022-05-16 ENCOUNTER — Other Ambulatory Visit: Payer: Self-pay

## 2022-05-16 ENCOUNTER — Inpatient Hospital Stay (HOSPITAL_BASED_OUTPATIENT_CLINIC_OR_DEPARTMENT_OTHER): Payer: BC Managed Care – PPO | Admitting: Hematology

## 2022-05-16 ENCOUNTER — Inpatient Hospital Stay: Payer: BC Managed Care – PPO

## 2022-05-16 ENCOUNTER — Inpatient Hospital Stay: Payer: BC Managed Care – PPO | Admitting: Hematology

## 2022-05-16 ENCOUNTER — Other Ambulatory Visit: Payer: Self-pay | Admitting: Hematology

## 2022-05-16 DIAGNOSIS — C787 Secondary malignant neoplasm of liver and intrahepatic bile duct: Secondary | ICD-10-CM

## 2022-05-16 DIAGNOSIS — D696 Thrombocytopenia, unspecified: Secondary | ICD-10-CM

## 2022-05-16 DIAGNOSIS — F419 Anxiety disorder, unspecified: Secondary | ICD-10-CM

## 2022-05-16 DIAGNOSIS — C7961 Secondary malignant neoplasm of right ovary: Secondary | ICD-10-CM

## 2022-05-16 DIAGNOSIS — M898X9 Other specified disorders of bone, unspecified site: Secondary | ICD-10-CM | POA: Diagnosis not present

## 2022-05-16 DIAGNOSIS — C19 Malignant neoplasm of rectosigmoid junction: Secondary | ICD-10-CM | POA: Diagnosis not present

## 2022-05-16 DIAGNOSIS — F32A Depression, unspecified: Secondary | ICD-10-CM | POA: Diagnosis not present

## 2022-05-16 DIAGNOSIS — G620 Drug-induced polyneuropathy: Secondary | ICD-10-CM | POA: Diagnosis not present

## 2022-05-16 DIAGNOSIS — Z5111 Encounter for antineoplastic chemotherapy: Secondary | ICD-10-CM | POA: Diagnosis not present

## 2022-05-16 LAB — CBC WITH DIFFERENTIAL (CANCER CENTER ONLY)
Abs Immature Granulocytes: 0.04 10*3/uL (ref 0.00–0.07)
Basophils Absolute: 0 10*3/uL (ref 0.0–0.1)
Basophils Relative: 0 %
Eosinophils Absolute: 0 10*3/uL (ref 0.0–0.5)
Eosinophils Relative: 0 %
HCT: 41.3 % (ref 36.0–46.0)
Hemoglobin: 14.8 g/dL (ref 12.0–15.0)
Immature Granulocytes: 0 %
Lymphocytes Relative: 7 %
Lymphs Abs: 0.8 10*3/uL (ref 0.7–4.0)
MCH: 33.9 pg (ref 26.0–34.0)
MCHC: 35.8 g/dL (ref 30.0–36.0)
MCV: 94.5 fL (ref 80.0–100.0)
Monocytes Absolute: 0.9 10*3/uL (ref 0.1–1.0)
Monocytes Relative: 9 %
Neutro Abs: 8.6 10*3/uL — ABNORMAL HIGH (ref 1.7–7.7)
Neutrophils Relative %: 84 %
Platelet Count: 102 10*3/uL — ABNORMAL LOW (ref 150–400)
RBC: 4.37 MIL/uL (ref 3.87–5.11)
RDW: 16.2 % — ABNORMAL HIGH (ref 11.5–15.5)
WBC Count: 10.4 10*3/uL (ref 4.0–10.5)
nRBC: 0 % (ref 0.0–0.2)

## 2022-05-16 LAB — CMP (CANCER CENTER ONLY)
ALT: 28 U/L (ref 0–44)
AST: 21 U/L (ref 15–41)
Albumin: 4.3 g/dL (ref 3.5–5.0)
Alkaline Phosphatase: 56 U/L (ref 38–126)
Anion gap: 5 (ref 5–15)
BUN: 14 mg/dL (ref 6–20)
CO2: 28 mmol/L (ref 22–32)
Calcium: 9.6 mg/dL (ref 8.9–10.3)
Chloride: 104 mmol/L (ref 98–111)
Creatinine: 0.73 mg/dL (ref 0.44–1.00)
GFR, Estimated: >60 mL/min (ref >60)
Glucose, Bld: 103 mg/dL — ABNORMAL HIGH (ref 70–99)
Potassium: 4.3 mmol/L (ref 3.5–5.1)
Sodium: 137 mmol/L (ref 135–145)
Total Bilirubin: 0.7 mg/dL (ref 0.3–1.2)
Total Protein: 6.6 g/dL (ref 6.5–8.1)

## 2022-05-16 NOTE — Progress Notes (Signed)
[place]   [phone] [phone]   Clinic Follow up Note   Patient Care Team: [facility] as PCP - General [name], MD as Consulting Physician (Oncology) [name], MD as Consulting Physician (General Surgery) [name], NP as Nurse Practitioner (Nurse Practitioner) Griffin Basil, MD as Consulting Physician (Obstetrics and Gynecology)  Date of Service:  05/17/2022  I connected with Stephanie [name] on 05/17/2022 at  3:20 PM EDT by telephone visit and verified that I am speaking with the correct person using two identifiers.  I discussed the limitations, risks, security and privacy concerns of performing an evaluation and management service by telephone and the availability of in person appointments. I also discussed with the patient that there may be a patient responsible charge related to this service. The patient expressed understanding and agreed to proceed.   Other persons participating in the visit and their role in the encounter:  none  Patient's location:  home Provider's location:  my office  CHIEF COMPLAINT: review recent scan results, f/u of metastatic colon cancer  CURRENT THERAPY:  CAPEOX, q21d, starting 03/28/22             -Xeloda dose: $RemoveBefor'1500mg'brsdaJmUEebS$  BID days 1-14  ASSESSMENT & PLAN:  Stephanie [name] is a 41 y.o. female with   1. Social Support, Anxiety and Depression -she is going through a lot of overwhelming social situations, including financial concerns. -she spoke with social worker 05/09/22 -she requested to delay treatment on 05/09/22 and again requests to postpone her rescheduled appointment on 05/20/22. I advised her to at least restart the Xeloda, as she has been off now for almost 2 weeks.   2. 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 She was found to have perforation and positive radial margin. Liver biopsy on 04/16/20 confirmed metastatic disease from her colon cancer -first line chemo with FOLFOXIRI q2 weeks from 05/23/20 - 10/24/20. Bevacizumab added with C2.  -s/p liver resection on 12/13/20 under Dr. Barry Dienes. Pathology showed: metastatic colon carcinoma to liver showing approximately 80% necrosis, resection margin negative.  -s/p colostomy takedown on 05/30/21 with Dr. Barry Dienes. Pathology was benign. -she has been on surveillance. Scans have been NED except for new right ovarian/adnexal cyst in 09/2021 (not seen 07/2021). -she underwent right ovary removal 02/25/22 with Dr. Polly Cobia. Path revealed adenocarcinoma, likely colon origin.  -given her high risk of recurrence, she started CAPEOX on 03/28/22. She has tolerated fairly well over all but developed more skin toxicity after cycle 2  -her CEA on 04/18/22 was slightly elevated to 5.22, stable on 05/09/22 at 5.72. -restaging CT CAP on 05/13/22 was stable. I reviewed the results with her today. She had questions regarding switching back to FOLFOX. I discussed that since she is tolerating Xeloda well and is also having transportation issues, I recommend she continue the CAPEOX. -labs reviewed, plt 102k, otherwise WNL   3. Chemo side effects: Reflux, nausea with vomiting, fatigue, skin rash, bone pain -she has tramadol for bone pain.   4.  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 More so in her feet, but she does note some functional deficits in her fingers. -stable   5. Mild thrombocytopenia -Secondary to chemotherapy -overall stable,  102k today (05/16/22)   6. Genetics  -Genetics consult on 12/04/20, genetic test was drawn 01/17/21. Results were negative     Plan: -resume Xeloda -she again requests delay in treatment. Will cancel 8/22  infusion, she will call to reschedule   No problem-specific Assessment & Plan notes found for this encounter.   SUMMARY OF ONCOLOGIC HISTORY: Oncology History Overview Note  Cancer Staging Malignant neoplasm of rectosigmoid junction [facility]) Staging form: Colon and Rectum, AJCC 8th Edition - Pathologic stage from 04/04/2020: pT4a, pN1b, cM1 - Signed by [name], NP on 05/07/2020    Malignant neoplasm of rectosigmoid junction ([place])  11/10/2019 Imaging   MRI Abdomen  IMPRESSION: 1. Redemonstrated hypoenhancing lesion of the posterior liver dome, hepatic segment VII, reduced in size compared to prior examination, measuring 1.3 x 1.3 cm, previously 1.8 x 1.7 cm. Findings are consistent with treatment response of a biopsy proven metastasis. No other evidence of lymphadenopathy or metastatic disease within the abdomen or pelvis. 2. Unchanged mild splenomegaly, maximum coronal span 14.0 cm. 3. 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   03/12/2020 Initial Diagnosis   Malignant neoplasm of rectosigmoid junction ([place])   03/12/2020 Imaging   CT AP with contrast IMPRESSION: 1. Overall findings are highly concerning for colorectal carcinoma involving the sigmoid colon with an associated perforation and adjacent abscess and phlegmon formation as detailed above. Currently, no collection is amenable to percutaneous drainage given their small size and location. 2. New 2 cm mass in the right hepatic lobe concerning for metastatic disease to the liver until proven otherwise. 3. Enlarged regional lymph nodes as detailed above is concerning for nodal metastatic disease. 4. Large stool burden. 5. Prominent pelvic veins which can be seen in patients with pelvic congestion syndrome.   03/13/2020 Imaging   ABD US IMPRESSION: Approximately 2.1 x 2.4 x 2.0 cm lobular homogeneously echogenic lesion in the right hepatic dome corresponds with the abnormality seen on the  prior CT scan. Sonographically, this appearance is highly suggestive of a benign hemangioma.   Recommend MRI of the abdomen with gadolinium contrast which may provide a noninvasive diagnosis of benign hemangioma.    03/13/2020 Imaging   MR ABD W/WO CONTRAST Hepatobiliary: Diffuse low signal intensity throughout the hepatic parenchyma on T2 weighted images, presumably a consequence of recent Feraheme injection. In segment 7 of the liver (axial image 8 of series 5) there is a 2.5 x 1.9 cm well-defined lesion which is slightly T2 hyperintense. This lesion appears hyperintense on pre gadolinium T1 weighted images (likely a consequence of Feraheme). Interpretation of enhancement within the lesion is compromised by presence of Feraheme. No other hepatic lesions are confidently identified on today's examination. No intra or extrahepatic biliary ductal dilatation. Gallbladder is normal in appearance.   03/31/2020 Imaging   CT AP W contrast IMPRESSION: 1. Previously noted sigmoid colon mass appears increased in size, and again appears to be associated with a focal contained perforation which crosses the midline and has fistulized into the left adnexal region where there is now what appears to be a large left tubo-ovarian abscess, as detailed above. This is also associated with multiple enlarged lymph nodes in the pelvis measuring up to 1.2 cm in short axis and borderline enlarged retroperitoneal lymph nodes, concerning for metastatic disease. In addition, previously suspected metastatic lesion in segment 7 of the liver has enlarged. 2. Small volume of ascites. 3. Additional incidental findings, as above.   04/04/2020 Cancer Staging   Staging  form: Colon and Rectum, AJCC 8th Edition - Pathologic stage from 04/04/2020: pT4a, pN1b, cM1 - Signed by [name], NP on 05/07/2020   04/04/2020 Procedure   Paracentesis, path showed no malignant cells (mixed acute and chronic inflammation present)    04/04/2020 Surgery   Open sigmoid colectomy and end colostomy by Dr. [name]   04/04/2020 Pathology Results   FINAL MICROSCOPIC DIAGNOSIS: A. COLON, RECTOSIGMOID, RESECTION: - Invasive moderately differentiated adenocarcinoma, 6 cm, involving rectosigmoid junction - Carcinoma invades into serosal surface with perforation and associated serositis - Radial resection margin is positive for carcinoma; proximal and distal margins are not involved - Lymphovascular invasion is present - Metastatic carcinoma to one of fifteen lymph nodes (1/15); one tumor deposit - See oncology table B. LYMPH NODES, MESENTERIC, RESECTION: - Metastatic adenocarcinoma to one of six lymph nodes (1/6) - One tumor deposit  Addendum to note 2 involved lymph nodes (of 21 examined nodes) pT4a,pN1b MMR-normal, preserved expression of MLH1, MSH2, MSH6, PMS2   04/12/2020 Procedure   PAC placement    04/13/2020 Imaging   CT chest without contrast IMPRESSION: Interval development of bilateral pleural effusions, left slightly greater than right, with resultant bibasilar atelectasis including subtotal collapse of the left lower lobe. No evidence of intrathoracic metastatic disease, though evaluation of the collapsed parenchyma is limited. Hepatic metastasis again demonstrated.     04/16/2020 Pathology Results   FINAL MICROSCOPIC DIAGNOSIS:  A. LIVER, RIGHT LOBE, BIOPSY:  - Adenocarcinoma.  COMMENT:  The morphology is compatible with the provided clinical history of colorectal carcinoma.    05/23/2020 - 10/24/2020 Chemotherapy   FOLFIRINOX q2weeks for 3-6 months starting 05/23/20. Bevacizumab-bvzr [name] added with C2. Oxaliplatin held with C11-12 due to reaction. (pt developed SOB, chest palpitation and abdominal discomfort shortly after oxaliplatin started). Completed on 10/24/20.   08/05/2020 Imaging   CT AP  IMPRESSION: 1. Postsurgical changes of distal colectomy with a left lower quadrant end ostomy. No  evidence of obstruction or acute complication at this time. Excluded rectal pouch in the deep pelvis without acute complication or worrisome features. 2. Slight interval decrease in size of a hypoattenuating lesion posterior right lobe liver measuring 1.7 x 1.8 x 2 cm. This lesion has previously undergone ultrasound-guided biopsy with pathologic results demonstrating adenocarcinoma compatible with metastatic disease from patient's resected colorectal carcinoma. 3. Slight prominence of the parametrial vessels bilaterally, nonspecific though can be seen in the setting of pelvic congestion syndrome. 4. Mild splenomegaly.  No focal lesion.     11/12/2020 Imaging   CT Chest  IMPRESSION: 1. No evidence of metastatic disease in the chest. 2. 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   12/13/2020 Surgery   A. 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 The report date is 01/28/2021.   The CustomNext-Cancer+RNAinsight panel offered by [facility] included sequencing and rearrangement analysis for the following 47 genes:  APC, ATM, AXIN2, BARD1, BMPR1A, BRCA1, BRCA2, BRIP1, CDH1, CDK4, CDKN2A, CHEK2, DICER1, EPCAM, GREM1, HOXB13, MEN1, MLH1, MSH2, MSH3, MSH6, MUTYH, NBN, NF1, NF2, NTHL1, PALB2, PMS2, POLD1, POLE, PTEN, RAD51C, RAD51D, RECQL, RET, SDHA, SDHAF2,  SDHB, SDHC, SDHD, SMAD4, SMARCA4, STK11, TP53, TSC1, TSC2, and VHL.  RNA data is routinely analyzed for use in variant interpretation for all genes.   02/19/2021 Imaging   CT A/P w/o  contrast  IMPRESSION: Slightly limited examination examination in absence of contrast administration. Status post partial right hepatectomy. Interval decrease in size in perihepatic fluid collection and resolution of right subdiaphragmatic fluid and gas. No new intra-abdominal fluid collections are identified.   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 Moderate stool throughout the colon without evidence of obstruction.   Fluid distension of the proximal duodenum to the level of the SMA hiatus which appears narrow. 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   05/05/2021 Imaging   CT AP  IMPRESSION: Postsurgical changes as described stable in appearance from the prior exam.   Changes suggestive of mild pelvic varices.   08/22/2021 Imaging   EXAM: CT ABDOMEN AND PELVIS WITH CONTRAST  IMPRESSION: 1. Extensive bowel content is identified throughout the colon consistent with constipation. 2. 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 3. Stable postoperative changes in the right lobe liver.   09/12/2021 Survivorship   SCP delivered by Cira Rue, NP   10/05/2021 Imaging   EXAM: CT ABDOMEN AND PELVIS WITH CONTRAST  IMPRESSION: 1. Right ovarian/adnexal cyst, 5.2 cm. Because this lesion is not adequately characterized, prompt [place] is recommended for further evaluation. Note: This recommendation does not apply to premenarchal patients and to those with increased risk (genetic, family history, elevated tumor markers or other high-risk factors) of ovarian cancer. Reference: JACR 2020 Feb; 17(2):248-254 2. No other evidence of an acute abnormality within the abdomen or pelvis. 3. Moderate increase in the colonic and rectal stool burden. No bowel obstruction or inflammation. 4. No evidence  of locally recurrent or metastatic colon carcinoma.   11/12/2021 Imaging   EXAM: CT CHEST WITHOUT CONTRAST  IMPRESSION: 1. No evidence of pulmonary metastatic disease. 2. Stable postoperative findings about the posterior right lobe of the liver.   03/28/2022 - 04/18/2022 Chemotherapy   Patient is on Treatment Plan : COLORECTAL Xelox (Capeox) q21d     03/28/2022 -  Chemotherapy   Patient is on Treatment Plan : COLORECTAL CAPEOX (130/850) q21d x 8 cycles        INTERVAL HISTORY:  Stephanie [name] was contacted for a follow up of metastatic colon cancer. She was last seen by me on 05/09/22.  She reports she continues to deal with a situation at home.   All other systems were reviewed with the patient and are negative.  MEDICAL HISTORY:  Past Medical History:  Diagnosis Date   Anxiety    Blood transfusion without reported diagnosis    Bowel obstruction (HCC)    BV (bacterial vaginosis)    Colon cancer [facility])    Colon Cancer 03-2020   Colon polyps    Colostomy present [facility])    03-2020   Family history of bladder cancer    History of chemotherapy    ended 09-2020   Lactose intolerance 03/12/2020   Neuromuscular disorder (HCC)    neuropathy feet hands legs   UTI (lower urinary tract infection)     SURGICAL HISTORY: Past Surgical History:  Procedure Laterality Date   CESAREAN SECTION     CHOLECYSTECTOMY  12/13/2020   COLECTOMY  03/2020   COLONOSCOPY     COLOSTOMY TAKEDOWN  N/A 05/30/2021   Procedure: LAPAROSCOPIC ASSISTED COLOSTOMY TAKEDOWN;  Surgeon: [name], MD;  Location: [place];  Service: General;  Laterality: N/A;   CYSTOSCOPY WITH STENT PLACEMENT  04/04/2020   Procedure: CYSTOSCOPY WITH STENT PLACEMENT;  Surgeon: [name], MD;  Location: [facility];  Service: General;;   LAPAROSCOPIC LIVER ULTRASOUND N/A 12/13/2020   Procedure: INTRAOPERATIVE LIVER ULTRASOUND;  Surgeon: [name], MD;  Location: [place];  Service: General;  Laterality: N/A;   LAPAROSCOPY N/A  12/13/2020   Procedure: LAPAROSCOPY DIAGNOSTIC;  Surgeon: [name], MD;  Location: [place];  Service: General;  Laterality: N/A;   LAPAROTOMY N/A 04/04/2020   Procedure: EXPLORATORY LAPAROTOMY;  Surgeon: [name], MD;  Location: [facility];  Service: General;  Laterality: N/A;   OPEN PARTIAL HEPATECTOMY  N/A 12/13/2020   Procedure: OPEN PARTIAL HEPATECTOMY;  Surgeon: [name], MD;  Location: [place];  Service: General;  Laterality: N/A;  ROOM 2 STARTING AT 09:30AM FOR 300 MIN   PORTACATH PLACEMENT Right 04/12/2020   Procedure: INSERTION PORT-A-CATH WITH ULTRASOUND;  Surgeon: Kieth Brightly Arta Bruce, MD;  Location: [facility];  Service: General;  Laterality: Right;    I have reviewed the social history and family history with the patient and they are unchanged from previous note.  ALLERGIES:  is allergic to oxaliplatin.  MEDICATIONS:  Current Outpatient Medications  Medication Sig Dispense Refill   acetaminophen (TYLENOL) 325 MG tablet Take 2 tablets (650 mg total) by mouth every 6 (six) hours as needed for mild pain, moderate pain, fever or headache (fever > 101).     AUROVELA 1.5/30 1.5-30 MG-MCG tablet TAKE ONE TABLET BY MOUTH DAILY 21 tablet 6   capecitabine (XELODA) 500 MG tablet Take 3 tablets (1,500 mg total) by mouth 2 (two) times daily after a meal. Take for 14 days on, then off 7 days. Repeat every 21 days. 84 tablet 1   HYDROcodone-acetaminophen (NORCO/VICODIN) 5-325 MG tablet Take 1 tablet by mouth every 6 (six) hours as needed for severe pain. 10 tablet 0   ibuprofen (ADVIL) 600 MG tablet Take 1 tablet (600 mg total) by mouth every 6 (six) hours as needed for headache, mild pain, moderate pain or cramping. 30 tablet 2   Lactic Ac-Citric Ac-Pot Bitart (PHEXXI) 1.8-1-0.4 % GEL Place 5 g vaginally as needed. 60 g 1   meloxicam (MOBIC) 7.5 MG tablet Take 1 tablet (7.5 mg total) by mouth daily. 20 tablet 0   metroNIDAZOLE (FLAGYL) 500 MG tablet Take 1 tablet (500 mg total) by mouth 3  (three) times daily. 21 tablet 0   ondansetron (ZOFRAN) 8 MG tablet Take 1 tablet (8 mg total) by mouth every 8 (eight) hours as needed for nausea or vomiting. 30 tablet 0   Prenatal Vit-Fe Fumarate-FA (PRENATAL MULTIVITAMIN) TABS tablet Take 1 tablet by mouth daily at 12 noon.     sulfamethoxazole-trimethoprim (BACTRIM DS) 800-160 MG tablet Take 1 tablet by mouth 2 (two) times daily.     SUTAB [phone] MG TABS Take by mouth.     topiramate (TOPAMAX) 50 MG tablet Take 50 mg by mouth daily.     traMADol (ULTRAM) 50 MG tablet Take 1 tablet (50 mg total) by mouth every 6 (six) hours as needed. 30 tablet 0   urea (CARMOL) 10 % cream Apply topically 2 (two) times daily. 71 g 0   No current facility-administered medications for this visit.    PHYSICAL EXAMINATION: ECOG PERFORMANCE STATUS: {CHL ONC ECOG [phone]}  There were no  vitals filed for this visit. Wt Readings from Last 3 Encounters:  05/09/22 154 lb 9.6 oz (70.1 kg)  04/18/22 149 lb 6.4 oz (67.8 kg)  03/28/22 149 lb 1.6 oz (67.6 kg)     No vitals taken today, Exam not performed today  LABORATORY DATA:  I have reviewed the data as listed    Latest Ref Rng & Units 05/16/2022   10:27 AM 05/09/2022   10:19 AM 04/18/2022   10:55 AM  CBC  WBC 4.0 - 10.5 K/uL 10.4  3.1  4.7   Hemoglobin 12.0 - 15.0 g/dL 14.8  15.0  13.9   Hematocrit 36.0 - 46.0 % 41.3  42.4  40.0   Platelets 150 - 400 K/uL 102  117  121         Latest Ref Rng & Units 05/16/2022   10:27 AM 05/09/2022   10:19 AM 04/18/2022   10:55 AM  CMP  Glucose 70 - 99 mg/dL 103  123  110   BUN 6 - 20 mg/dL $Remove'14  12  10   'gNRbfhw$ Creatinine 0.44 - 1.00 mg/dL 0.73  0.73  0.73   Sodium 135 - 145 mmol/L 137  139  141   Potassium 3.5 - 5.1 mmol/L 4.3  3.7  3.5   Chloride 98 - 111 mmol/L 104  107  107   CO2 22 - 32 mmol/L $RemoveB'28  26  28   'KuLqsmyk$ Calcium 8.9 - 10.3 mg/dL 9.6  9.0  9.6   Total Protein 6.5 - 8.1 g/dL 6.6  6.9  6.4   Total Bilirubin 0.3 - 1.2 mg/dL 0.7  0.9  0.5   Alkaline  Phos 38 - 126 U/L 56  65  47   AST 15 - 41 U/L 21  43  22   ALT 0 - 44 U/L 28  56  25       RADIOGRAPHIC STUDIES: I have personally reviewed the radiological images as listed and agreed with the findings in the report. No results found.    No orders of the defined types were placed in this encounter.  All questions were answered. The patient knows to call the clinic with any problems, questions or concerns. No barriers to learning was detected. The total time spent in the appointment was {CHL ONC TIME VISIT - GGEZM:[id number]}.     [name], MD 05/17/2022   I, Wilburn Mylar, am acting as scribe for [name], MD.   {Add scribe attestation statement}

## 2022-05-17 ENCOUNTER — Encounter: Payer: Self-pay | Admitting: Hematology

## 2022-05-19 ENCOUNTER — Other Ambulatory Visit (HOSPITAL_COMMUNITY): Payer: Self-pay

## 2022-05-19 ENCOUNTER — Other Ambulatory Visit: Payer: Self-pay | Admitting: Hematology

## 2022-05-19 ENCOUNTER — Inpatient Hospital Stay: Payer: BC Managed Care – PPO

## 2022-05-19 ENCOUNTER — Ambulatory Visit: Payer: BC Managed Care – PPO

## 2022-05-19 DIAGNOSIS — C19 Malignant neoplasm of rectosigmoid junction: Secondary | ICD-10-CM

## 2022-05-19 MED ORDER — CAPECITABINE 500 MG PO TABS
ORAL_TABLET | ORAL | 1 refills | Status: DC
  Filled 2022-05-19: qty 70, 21d supply, fill #0
  Filled 2022-06-13: qty 70, 21d supply, fill #1

## 2022-05-20 ENCOUNTER — Other Ambulatory Visit (HOSPITAL_COMMUNITY): Payer: Self-pay

## 2022-05-21 ENCOUNTER — Other Ambulatory Visit: Payer: Self-pay

## 2022-05-22 ENCOUNTER — Other Ambulatory Visit: Payer: Self-pay | Admitting: Hematology

## 2022-05-23 ENCOUNTER — Other Ambulatory Visit (HOSPITAL_COMMUNITY): Payer: Self-pay

## 2022-05-23 ENCOUNTER — Other Ambulatory Visit: Payer: Self-pay

## 2022-05-23 ENCOUNTER — Other Ambulatory Visit: Payer: Self-pay | Admitting: Hematology and Oncology

## 2022-05-23 ENCOUNTER — Other Ambulatory Visit: Payer: Self-pay | Admitting: Hematology

## 2022-05-23 ENCOUNTER — Ambulatory Visit (HOSPITAL_BASED_OUTPATIENT_CLINIC_OR_DEPARTMENT_OTHER): Payer: BC Managed Care – PPO | Admitting: Hematology and Oncology

## 2022-05-23 ENCOUNTER — Inpatient Hospital Stay: Payer: BC Managed Care – PPO

## 2022-05-23 ENCOUNTER — Encounter: Payer: Self-pay | Admitting: Hematology and Oncology

## 2022-05-23 VITALS — BP 102/67 | HR 72 | Temp 97.8°F | Resp 17

## 2022-05-23 DIAGNOSIS — T451X5A Adverse effect of antineoplastic and immunosuppressive drugs, initial encounter: Secondary | ICD-10-CM

## 2022-05-23 DIAGNOSIS — C19 Malignant neoplasm of rectosigmoid junction: Secondary | ICD-10-CM

## 2022-05-23 DIAGNOSIS — Z87891 Personal history of nicotine dependence: Secondary | ICD-10-CM | POA: Diagnosis not present

## 2022-05-23 DIAGNOSIS — Z8052 Family history of malignant neoplasm of bladder: Secondary | ICD-10-CM

## 2022-05-23 DIAGNOSIS — Z95828 Presence of other vascular implants and grafts: Secondary | ICD-10-CM

## 2022-05-23 DIAGNOSIS — Z5111 Encounter for antineoplastic chemotherapy: Secondary | ICD-10-CM | POA: Diagnosis not present

## 2022-05-23 DIAGNOSIS — C787 Secondary malignant neoplasm of liver and intrahepatic bile duct: Secondary | ICD-10-CM

## 2022-05-23 LAB — CMP (CANCER CENTER ONLY)
ALT: 21 U/L (ref 0–44)
AST: 18 U/L (ref 15–41)
Albumin: 4.1 g/dL (ref 3.5–5.0)
Alkaline Phosphatase: 61 U/L (ref 38–126)
Anion gap: 4 — ABNORMAL LOW (ref 5–15)
BUN: 16 mg/dL (ref 6–20)
CO2: 26 mmol/L (ref 22–32)
Calcium: 8.8 mg/dL — ABNORMAL LOW (ref 8.9–10.3)
Chloride: 111 mmol/L (ref 98–111)
Creatinine: 0.54 mg/dL (ref 0.44–1.00)
GFR, Estimated: >60 mL/min (ref >60)
Glucose, Bld: 118 mg/dL — ABNORMAL HIGH (ref 70–99)
Potassium: 3.7 mmol/L (ref 3.5–5.1)
Sodium: 141 mmol/L (ref 135–145)
Total Bilirubin: 0.4 mg/dL (ref 0.3–1.2)
Total Protein: 6.4 g/dL — ABNORMAL LOW (ref 6.5–8.1)

## 2022-05-23 LAB — CBC WITH DIFFERENTIAL (CANCER CENTER ONLY)
Abs Immature Granulocytes: 0.01 10*3/uL (ref 0.00–0.07)
Basophils Absolute: 0 10*3/uL (ref 0.0–0.1)
Basophils Relative: 0 %
Eosinophils Absolute: 0.1 10*3/uL (ref 0.0–0.5)
Eosinophils Relative: 2 %
HCT: 40.7 % (ref 36.0–46.0)
Hemoglobin: 14.3 g/dL (ref 12.0–15.0)
Immature Granulocytes: 0 %
Lymphocytes Relative: 21 %
Lymphs Abs: 1.2 10*3/uL (ref 0.7–4.0)
MCH: 33.9 pg (ref 26.0–34.0)
MCHC: 35.1 g/dL (ref 30.0–36.0)
MCV: 96.4 fL (ref 80.0–100.0)
Monocytes Absolute: 0.6 10*3/uL (ref 0.1–1.0)
Monocytes Relative: 10 %
Neutro Abs: 3.7 10*3/uL (ref 1.7–7.7)
Neutrophils Relative %: 67 %
Platelet Count: 107 10*3/uL — ABNORMAL LOW (ref 150–400)
RBC: 4.22 MIL/uL (ref 3.87–5.11)
RDW: 15.9 % — ABNORMAL HIGH (ref 11.5–15.5)
WBC Count: 5.6 10*3/uL (ref 4.0–10.5)
nRBC: 0 % (ref 0.0–0.2)

## 2022-05-23 MED ORDER — METHYLPREDNISOLONE SODIUM SUCC 125 MG IJ SOLR
125.0000 mg | Freq: Once | INTRAMUSCULAR | Status: AC
  Administered 2022-05-23: 125 mg via INTRAVENOUS
  Filled 2022-05-23: qty 2

## 2022-05-23 MED ORDER — DIPHENHYDRAMINE HCL 50 MG/ML IJ SOLN
50.0000 mg | Freq: Once | INTRAMUSCULAR | Status: AC | PRN
  Administered 2022-05-23: 50 mg via INTRAVENOUS

## 2022-05-23 MED ORDER — PROCHLORPERAZINE EDISYLATE 10 MG/2ML IJ SOLN: 10.0000 mg | Freq: Once | INTRAMUSCULAR | Status: DC

## 2022-05-23 MED ORDER — OXALIPLATIN CHEMO INJECTION 100 MG/20ML
110.0000 mg/m2 | Freq: Once | INTRAVENOUS | Status: AC
  Administered 2022-05-23: 200 mg via INTRAVENOUS
  Filled 2022-05-23: qty 40

## 2022-05-23 MED ORDER — PROCHLORPERAZINE EDISYLATE 10 MG/2ML IJ SOLN
INTRAMUSCULAR | Status: AC
  Administered 2022-05-23: 10 mg
  Filled 2022-05-23: qty 2

## 2022-05-23 MED ORDER — PALONOSETRON HCL INJECTION 0.25 MG/5ML
0.2500 mg | Freq: Once | INTRAVENOUS | Status: AC
  Administered 2022-05-23: 0.25 mg via INTRAVENOUS
  Filled 2022-05-23: qty 5

## 2022-05-23 MED ORDER — DEXTROSE 5 % IV SOLN: Freq: Once | INTRAVENOUS | Status: AC

## 2022-05-23 MED ORDER — SODIUM CHLORIDE 0.9 % IV SOLN: Freq: Once | INTRAVENOUS | Status: AC

## 2022-05-23 MED ORDER — ONDANSETRON HCL 8 MG PO TABS: ORAL_TABLET | ORAL | 2 refills | Status: AC

## 2022-05-23 MED ORDER — HEPARIN SOD (PORK) LOCK FLUSH 100 UNIT/ML IV SOLN
500.0000 [IU] | Freq: Once | INTRAVENOUS | Status: AC | PRN
  Administered 2022-05-23: 500 [IU]

## 2022-05-23 MED ORDER — ACETAMINOPHEN 325 MG PO TABS
650.0000 mg | ORAL_TABLET | Freq: Once | ORAL | Status: AC
  Administered 2022-05-23: 650 mg via ORAL
  Filled 2022-05-23: qty 2

## 2022-05-23 MED ORDER — FAMOTIDINE IN NACL 20-0.9 MG/50ML-% IV SOLN
20.0000 mg | Freq: Once | INTRAVENOUS | Status: AC | PRN
  Administered 2022-05-23: 20 mg via INTRAVENOUS

## 2022-05-23 MED ORDER — SODIUM CHLORIDE 0.9% FLUSH
10.0000 mL | INTRAVENOUS | Status: DC | PRN
  Administered 2022-05-23: 10 mL

## 2022-05-23 MED ORDER — MORPHINE SULFATE (PF) 2 MG/ML IV SOLN
INTRAVENOUS | Status: AC
  Administered 2022-05-23: 2 mg via INTRAVENOUS
  Filled 2022-05-23: qty 1

## 2022-05-23 MED ORDER — FAMOTIDINE IN NACL 20-0.9 MG/50ML-% IV SOLN
20.0000 mg | Freq: Once | INTRAVENOUS | Status: AC
  Administered 2022-05-23: 20 mg via INTRAVENOUS
  Filled 2022-05-23: qty 50

## 2022-05-23 MED ORDER — PROCHLORPERAZINE MALEATE 10 MG PO TABS: 10.0000 mg | ORAL_TABLET | Freq: Four times a day (QID) | ORAL | 1 refills | Status: DC | PRN

## 2022-05-23 MED ORDER — MORPHINE SULFATE (PF) 2 MG/ML IV SOLN
2.0000 mg | INTRAVENOUS | Status: DC | PRN
  Filled 2022-05-23: qty 1

## 2022-05-23 MED ORDER — DIPHENHYDRAMINE HCL 50 MG/ML IJ SOLN
50.0000 mg | Freq: Once | INTRAMUSCULAR | Status: AC
  Administered 2022-05-23: 50 mg via INTRAVENOUS
  Filled 2022-05-23: qty 1

## 2022-05-23 NOTE — Progress Notes (Signed)
Okay to use labs from 8/18 per Dr. Burr Medico. Do not need to wait for lab results today for treatment.  Hypersensitivity Reaction note  Date of event: 05/23/22 Time of event: 11:41 Generic name of drug involved: Oxaliplatin Name of provider notified of the hypersensitivity reaction: Dr. Alvy Bimler Was agent that likely caused hypersensitivity reaction added to Allergies List within EMR? yes Chain of events including reaction signs/symptoms, treatment administered, and outcome (e.g., drug resumed; drug discontinued; sent to Emergency Department; etc.) patient c/o feeling hot and  SOB appeared flushed then started to vomit. Oxaliplatin stopped and NS open to gravity. Administered Ben 50 IV and pep 20 IV. Patient c/o abdominal cramping/pain and continued to vomit. Dr. Alvy Bimler at chairside. Verbal order for comp 10 IV and morphine 2 IV. See times on MAR. Patient then was made comfortable so she can rest. Oxaliplatin stopped and order for 1L NS over 2 hours started.  Scot Dock, RN 05/23/2022 12:33 PM  Patient denied any nausea, SOB, or any discomfort. VSS and she was able to ambulate to lobby. Declined wheelchair.

## 2022-05-23 NOTE — Assessment & Plan Note (Signed)
She has developed severe reaction to oxaliplatin, resolved with conservative approach and additional antihistamines and steroids The patient is advised to hold off taking Xeloda over the weekend and to take Pepcid, Benadryl or Claritin as needed if she developed persistent rash or symptoms of flushing I will reach out to her primary oncologist to call her on Monday for an update and future plan of care

## 2022-05-23 NOTE — Assessment & Plan Note (Signed)
She has severe infusion reaction to oxaliplatin, resolved with additional premedications today The patient appears somewhat sedated prior to discharge and advised her not to drive The patient will call a friend to pick her up and will inform friends/family member to check on her over the weekend
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
## 2022-05-23 NOTE — Progress Notes (Signed)
MD increasing Oxaliplatin to '110mg'$ /m2.  Raul [place], [place], BCPS, BCOP 05/23/2022 10:14 AM

## 2022-05-26 ENCOUNTER — Telehealth: Payer: Self-pay

## 2022-05-26 NOTE — Telephone Encounter (Signed)
This nurse reached out to patient related to reaction during infusion on Friday.  Patient states that she has been feeling fine.  She had one episode of diarrhea on Sunday but other than that she is feeling well.  Patient wants to know should she have waited to start her Xeloda cycle today, she stated that she felt fine and thought it would be ok to go ahead and start the Xeloda cycle.  This nurse spoke with the provider who advised that it is ok for patient to take her Xeloda, and that she will not be continuing on the Oxaliplatin.  Provider requests to schedule patient for labs and office visit in 2 weeks. Left message for patient with provider recommendations.  Patient knows to call if there are any concerns or questions or to reschedule appointments.

## 2022-05-29 ENCOUNTER — Encounter: Payer: Self-pay | Admitting: Hematology

## 2022-05-29 NOTE — Progress Notes (Signed)
Received voicemail from patient regarding available resources with rent. Patient currently out on disability. Advised patient what would be needed to apply for second-time $1000 [employer] grant to assist with personal expenses while going through treatment as well as documents needed from landlord(lease agreement and W-9). She verbalized understanding. Gave her my contact name, number, and email address for communication and for any additional financial questions or concerns while going through treatment.

## 2022-05-30 ENCOUNTER — Other Ambulatory Visit: Payer: BC Managed Care – PPO

## 2022-05-30 ENCOUNTER — Ambulatory Visit: Payer: BC Managed Care – PPO

## 2022-05-30 ENCOUNTER — Ambulatory Visit: Payer: BC Managed Care – PPO | Admitting: Hematology

## 2022-05-30 ENCOUNTER — Other Ambulatory Visit: Payer: Self-pay

## 2022-06-02 ENCOUNTER — Emergency Department (HOSPITAL_COMMUNITY): Payer: BC Managed Care – PPO

## 2022-06-02 ENCOUNTER — Encounter (HOSPITAL_COMMUNITY): Payer: Self-pay

## 2022-06-02 ENCOUNTER — Emergency Department (HOSPITAL_COMMUNITY)
Admission: EM | Admit: 2022-06-02 | Discharge: 2022-06-02 | Disposition: A | Discharge status: Home / Self Care | Payer: BC Managed Care – PPO | Attending: Emergency Medicine | Admitting: Emergency Medicine

## 2022-06-02 ENCOUNTER — Other Ambulatory Visit: Payer: Self-pay

## 2022-06-02 DIAGNOSIS — Z8543 Personal history of malignant neoplasm of ovary: Secondary | ICD-10-CM | POA: Insufficient documentation

## 2022-06-02 DIAGNOSIS — D219 Benign neoplasm of connective and other soft tissue, unspecified: Secondary | ICD-10-CM

## 2022-06-02 DIAGNOSIS — D259 Leiomyoma of uterus, unspecified: Secondary | ICD-10-CM | POA: Insufficient documentation

## 2022-06-02 DIAGNOSIS — N939 Abnormal uterine and vaginal bleeding, unspecified: Secondary | ICD-10-CM | POA: Diagnosis present

## 2022-06-02 DIAGNOSIS — Z87891 Personal history of nicotine dependence: Secondary | ICD-10-CM | POA: Diagnosis not present

## 2022-06-02 LAB — CBC WITH DIFFERENTIAL/PLATELET
Abs Immature Granulocytes: 0.02 10*3/uL (ref 0.00–0.07)
Basophils Absolute: 0 10*3/uL (ref 0.0–0.1)
Basophils Relative: 0 %
Eosinophils Absolute: 0.1 10*3/uL (ref 0.0–0.5)
Eosinophils Relative: 2 %
HCT: 39.2 % (ref 36.0–46.0)
Hemoglobin: 13.2 g/dL (ref 12.0–15.0)
Immature Granulocytes: 1 %
Lymphocytes Relative: 29 %
Lymphs Abs: 1.2 10*3/uL (ref 0.7–4.0)
MCH: 33.9 pg (ref 26.0–34.0)
MCHC: 33.7 g/dL (ref 30.0–36.0)
MCV: 100.8 fL — ABNORMAL HIGH (ref 80.0–100.0)
Monocytes Absolute: 0.4 10*3/uL (ref 0.1–1.0)
Monocytes Relative: 9 %
Neutro Abs: 2.6 10*3/uL (ref 1.7–7.7)
Neutrophils Relative %: 59 %
Platelets: 113 10*3/uL — ABNORMAL LOW (ref 150–400)
RBC: 3.89 MIL/uL (ref 3.87–5.11)
RDW: 14.6 % (ref 11.5–15.5)
WBC: 4.3 10*3/uL (ref 4.0–10.5)
nRBC: 0 % (ref 0.0–0.2)

## 2022-06-02 LAB — COMPREHENSIVE METABOLIC PANEL
ALT: 19 U/L (ref 0–44)
AST: 19 U/L (ref 15–41)
Albumin: 3.8 g/dL (ref 3.5–5.0)
Alkaline Phosphatase: 54 U/L (ref 38–126)
Anion gap: 4 — ABNORMAL LOW (ref 5–15)
BUN: 15 mg/dL (ref 6–20)
CO2: 26 mmol/L (ref 22–32)
Calcium: 8.9 mg/dL (ref 8.9–10.3)
Chloride: 111 mmol/L (ref 98–111)
Creatinine, Ser: 0.77 mg/dL (ref 0.44–1.00)
GFR, Estimated: >60 mL/min (ref >60)
Glucose, Bld: 131 mg/dL — ABNORMAL HIGH (ref 70–99)
Potassium: 3.8 mmol/L (ref 3.5–5.1)
Sodium: 141 mmol/L (ref 135–145)
Total Bilirubin: 0.4 mg/dL (ref 0.3–1.2)
Total Protein: 6.3 g/dL — ABNORMAL LOW (ref 6.5–8.1)

## 2022-06-02 LAB — PROTIME-INR
INR: 1.1 (ref 0.8–1.2)
Prothrombin Time: 13.7 seconds (ref 11.4–15.2)

## 2022-06-02 LAB — URINALYSIS, ROUTINE W REFLEX MICROSCOPIC
Bilirubin Urine: NEGATIVE
Glucose, UA: NEGATIVE mg/dL
Hgb urine dipstick: NEGATIVE
Ketones, ur: NEGATIVE mg/dL
Leukocytes,Ua: NEGATIVE
Nitrite: NEGATIVE
Protein, ur: NEGATIVE mg/dL
Specific Gravity, Urine: 1.02 (ref 1.005–1.030)
pH: 6 (ref 5.0–8.0)

## 2022-06-02 LAB — I-STAT BETA HCG BLOOD, ED (MC, WL, AP ONLY): I-stat hCG, quantitative: <5 m[IU]/mL (ref <5)

## 2022-06-02 MED ORDER — SODIUM CHLORIDE 0.9 % IV BOLUS
1000.0000 mL | Freq: Once | INTRAVENOUS | Status: AC
  Administered 2022-06-02: 1000 mL via INTRAVENOUS

## 2022-06-02 MED ORDER — HYDROCODONE-ACETAMINOPHEN 5-325 MG PO TABS
1.0000 | ORAL_TABLET | Freq: Once | ORAL | Status: AC
  Administered 2022-06-02: 1 via ORAL
  Filled 2022-06-02: qty 1

## 2022-06-02 NOTE — Discharge Instructions (Signed)
Note that your work-up today was overall consistent with fibroid found on the ultrasound of your uterus.  I recommend follow-up with your OB/GYN as soon as you are able to make an appointment to make sure bleeding stops.  Note you can still have menstrual cycles while retaining 1 ovary.  Please do not hesitate to return to the emergency department for worrisome signs and symptoms we discussed become apparent.

## 2022-06-02 NOTE — ED Triage Notes (Signed)
Patient states that she went for a walk and when she returned she had vaginal bleeding. Patient states she has not had any vaginal bleeding since May of this year after having a right ovary removed and a tubal ligation. Patient also c/o low back pain, and abdominal pain L>R.

## 2022-06-02 NOTE — ED Provider Notes (Signed)
[facility] DEPT Provider Note   CSN: [id number] Arrival date & time: 06/02/22  1654     History   Stephanie [name] is a 41 y.o. female.   41 year old female presents emergency department with complaints of vaginal bleeding.  Patient states that symptoms began after she went for a walk earlier today.  She notes some associated menstrual cramping type pain but states she has not had a menstrual cycle since before her surgery in May and was told she is not expected to have 1.  She noticed for the past 2 days "pinching" type pain localized in her left lower back that she states feels similar to time she had ovarian cysts in the past. 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  She is currently receiving chemotherapy through Xeloda.  Denies fever, chills, night sweats, chest pain, shortness of breath, abdominal pain, nausea, vomiting, urinary symptoms, vaginal discharge, change in bowel habits, hematochezia, melena.  Past medical history significant for metastatic neoplasm of the rectosigmoid junction, ovarian cancer,  Home Medications Prior to Admission medications   Medication Sig Start Date End Date Taking? Authorizing Provider  acetaminophen (TYLENOL) 325 MG tablet Take 2 tablets (650 mg total) by mouth every 6 (six) hours as needed for mild pain, moderate pain, fever or headache (fever > 101). 05/31/21   [name], MD  AUROVELA 1.5/30 1.5-30 MG-MCG tablet TAKE ONE TABLET BY MOUTH DAILY 02/10/22   Griffin Basil, MD  capecitabine (XELODA) 500 MG tablet Take 3 tablets in morning and 2 tablets in evening, every 12 hours.  Take Take for 14 days on, then off 7 days. Repeat every 21 days. 05/19/22   [name], MD  HYDROcodone-acetaminophen (NORCO/VICODIN) 5-325 MG tablet Take 1 tablet  by mouth every 6 (six) hours as needed for severe pain. 05/01/22   [name], MD  ibuprofen (ADVIL) 600 MG tablet Take 1 tablet (600 mg total) by mouth every 6 (six) hours as needed for headache, mild pain, moderate pain or cramping. 10/08/21   Griffin Basil, MD  Lactic Ac-Citric Ac-Pot Bitart (PHEXXI) 1.8-1-0.4 % GEL Place 5 g vaginally as needed. 08/05/21   Leftwich-Kirby, Kathie Dike, CNM  meloxicam (MOBIC) 7.5 MG tablet Take 1 tablet (7.5 mg total) by mouth daily. 04/28/21   Montine Circle, PA-C  metroNIDAZOLE (FLAGYL) 500 MG tablet Take 1 tablet (500 mg total) by mouth 3 (three) times daily. 04/29/22   [name], MD  ondansetron (ZOFRAN) 8 MG tablet TAKE 1 TABLET(8 MG) BY MOUTH EVERY 8 HOURS AS NEEDED FOR NAUSEA OR VOMITING 05/23/22   [name], MD  Prenatal Vit-Fe Fumarate-FA (PRENATAL MULTIVITAMIN) TABS tablet Take 1 tablet by mouth daily at 12 noon.    [provider]  prochlorperazine (COMPAZINE) 10 MG tablet Take 1 tablet (10 mg total) by mouth every 6 (six) hours as needed (Nausea or vomiting). 05/23/22   [name], MD  sulfamethoxazole-trimethoprim (BACTRIM DS) 800-160 MG tablet Take 1 tablet by mouth 2 (two) times daily. 12/12/21   [provider]  SUTAB [phone] MG TABS Take by mouth. 11/27/21   [provider]  topiramate (TOPAMAX) 50 MG tablet Take 50 mg by mouth daily. 01/27/22   [provider]  traMADol (ULTRAM) 50 MG tablet Take 1 tablet (50 mg total) by mouth every 6 (six) hours as  needed. 04/18/22   [name], MD  urea (CARMOL) 10 % cream Apply topically 2 (two) times daily. 05/01/22   [name], MD      Allergies    Oxaliplatin    Review of Systems   Review of Systems  Genitourinary:  Positive for vaginal bleeding.  Musculoskeletal:  Positive for back pain.  All other systems reviewed and are negative.   Physical Exam Updated Vital Signs BP 108/75   Pulse (!) 58   Temp 97.7 F (36.5 C) (Oral)   Resp 13   Ht '5\' 8"'$  (1.727 m)   Wt 68 kg    SpO2 98%   BMI 22.81 kg/m  Physical Exam Vitals and nursing note reviewed. Exam conducted with a chaperone present.  Constitutional:      General: She is not in acute distress.    Appearance: She is well-developed. She is not ill-appearing.  HENT:     Head: Normocephalic and atraumatic.  Eyes:     Conjunctiva/sclera: Conjunctivae normal.  Cardiovascular:     Rate and Rhythm: Normal rate and regular rhythm.     Heart sounds: No murmur heard. Pulmonary:     Effort: Pulmonary effort is normal. No respiratory distress.     Breath sounds: Normal breath sounds.  Abdominal:     General: Abdomen is flat.     Palpations: Abdomen is soft.     Tenderness: There is no abdominal tenderness. There is no right CVA tenderness or left CVA tenderness. Negative signs include Murphy's sign, Rovsing's sign, McBurney's sign and psoas sign.  Genitourinary:    General: Normal vulva.     Exam position: Lithotomy position.     Pubic Area: No rash.      Labia:        Right: No rash, tenderness, lesion or injury.        Left: No rash, tenderness, lesion or injury.      Vagina: No signs of injury and foreign body. Bleeding present. No vaginal discharge, erythema, tenderness, lesions or prolapsed vaginal walls.     Cervix: Cervical bleeding present. No cervical motion tenderness, discharge, friability, lesion, erythema or eversion.     Uterus: Normal.      Adnexa: Left adnexa normal.     Comments:   Musculoskeletal:        General: No swelling.     Cervical back: Neck supple.  Skin:    General: Skin is warm and dry.     Capillary Refill: Capillary refill takes less than 2 seconds.  Neurological:     Mental Status: She is alert.  Psychiatric:        Mood and Affect: Mood normal.     ED Results / Procedures / Treatments   Labs (all labs ordered are listed, but only abnormal results are displayed) Labs Reviewed  COMPREHENSIVE METABOLIC PANEL - Abnormal; Notable for the following components:       Result Value   Glucose, Bld 131 (*)    Total Protein 6.3 (*)    Anion gap 4 (*)    All other components within normal limits  CBC WITH DIFFERENTIAL/PLATELET - Abnormal; Notable for the following components:   MCV 100.8 (*)    Platelets 113 (*)    All other components within normal limits  URINALYSIS, ROUTINE W REFLEX MICROSCOPIC  PROTIME-INR  I-STAT BETA HCG BLOOD, ED ([facility], [facility], [facility] ONLY)    EKG None  Radiology US Pelvis Complete  Result Date: 06/02/2022 CLINICAL DATA:  Initial evaluation for vaginal bleeding. History of prior right oophorectomy. EXAM: TRANSABDOMINAL AND TRANSVAGINAL ULTRASOUND OF PELVIS DOPPLER ULTRASOUND OF OVARIES TECHNIQUE: Both transabdominal and transvaginal ultrasound examinations of the pelvis were performed. Transabdominal technique was performed for global imaging of the pelvis including uterus, ovaries, adnexal regions, and pelvic cul-de-sac. It was necessary to proceed with endovaginal exam following the transabdominal exam to visualize the endometrium. Color and duplex Doppler ultrasound was utilized to evaluate blood flow to the ovaries. COMPARISON:  Prior CT from 05/13/2022. FINDINGS: Uterus Measurements: 9.5 x 4.5 x 4.7 cm = volume: 105.7 mL. Uterus is retroflexed. 1.7 x 1.6 x 1.9 cm intramural fibroid present at the anterior uterine body. Endometrium Thickness: 3.6 mm.  No focal abnormality visualized. Right ovary Not visualized, reportedly surgically absent.  No adnexal mass. Left ovary Measurements: 3.2 x 1.9 x 2.4 cm = volume: 7.7 mL. Normal appearance/no adnexal mass. Pulsed Doppler evaluation of the left ovary demonstrates normal low-resistance arterial and venous waveforms. Other findings No abnormal free fluid. IMPRESSION: 1. 1.9 cm intramural fibroid at the anterior uterine body. 2. Endometrial stripe within normal limits measuring 3.6 mm in thickness. If bleeding remains unresponsive to hormonal or medical therapy, sonohysterogram should be considered for  focal lesion work-up. (Ref: Radiological Reasoning: Algorithmic Workup of Abnormal Vaginal Bleeding with Endovaginal Sonography and Sonohysterography. AJR 2008; 672:C94-70). 3. Normal left ovary without evidence for torsion. 4. Prior right oophorectomy.  No adnexal mass or free fluid. Electronically Signed   By: Jeannine Boga M.D.   On: 06/02/2022 19:02   US Transvaginal Non-OB  Result Date: 06/02/2022 CLINICAL DATA:  Initial evaluation for vaginal bleeding. History of prior right oophorectomy. EXAM: TRANSABDOMINAL AND TRANSVAGINAL ULTRASOUND OF PELVIS DOPPLER ULTRASOUND OF OVARIES TECHNIQUE: Both transabdominal and transvaginal ultrasound examinations of the pelvis were performed. Transabdominal technique was performed for global imaging of the pelvis including uterus, ovaries, adnexal regions, and pelvic cul-de-sac. It was necessary to proceed with endovaginal exam following the transabdominal exam to visualize the endometrium. Color and duplex Doppler ultrasound was utilized to evaluate blood flow to the ovaries. COMPARISON:  Prior CT from 05/13/2022. FINDINGS: Uterus Measurements: 9.5 x 4.5 x 4.7 cm = volume: 105.7 mL. Uterus is retroflexed. 1.7 x 1.6 x 1.9 cm intramural fibroid present at the anterior uterine body. Endometrium Thickness: 3.6 mm.  No focal abnormality visualized. Right ovary Not visualized, reportedly surgically absent.  No adnexal mass. Left ovary Measurements: 3.2 x 1.9 x 2.4 cm = volume: 7.7 mL. Normal appearance/no adnexal mass. Pulsed Doppler evaluation of the left ovary demonstrates normal low-resistance arterial and venous waveforms. Other findings No abnormal free fluid. IMPRESSION: 1. 1.9 cm intramural fibroid at the anterior uterine body. 2. Endometrial stripe within normal limits measuring 3.6 mm in thickness. If bleeding remains unresponsive to hormonal or medical therapy, sonohysterogram should be considered for focal lesion work-up. (Ref: Radiological Reasoning:  Algorithmic Workup of Abnormal Vaginal Bleeding with Endovaginal Sonography and Sonohysterography. AJR 2008; 962:E36-62). 3. Normal left ovary without evidence for torsion. 4. Prior right oophorectomy.  No adnexal mass or free fluid. Electronically Signed   By: Jeannine Boga M.D.   On: 06/02/2022 19:02   [place] Art/Ven Flow Abd Pelv Doppler  Result Date: 06/02/2022 CLINICAL DATA:  Initial evaluation for vaginal bleeding. History of prior right oophorectomy. EXAM: TRANSABDOMINAL AND TRANSVAGINAL ULTRASOUND OF PELVIS DOPPLER ULTRASOUND OF OVARIES TECHNIQUE: Both transabdominal and transvaginal ultrasound examinations of the pelvis were performed. Transabdominal technique was performed for global imaging of the pelvis including uterus,  ovaries, adnexal regions, and pelvic cul-de-sac. It was necessary to proceed with endovaginal exam following the transabdominal exam to visualize the endometrium. Color and duplex Doppler ultrasound was utilized to evaluate blood flow to the ovaries. COMPARISON:  Prior CT from 05/13/2022. FINDINGS: Uterus Measurements: 9.5 x 4.5 x 4.7 cm = volume: 105.7 mL. Uterus is retroflexed. 1.7 x 1.6 x 1.9 cm intramural fibroid present at the anterior uterine body. Endometrium Thickness: 3.6 mm.  No focal abnormality visualized. Right ovary Not visualized, reportedly surgically absent.  No adnexal mass. Left ovary Measurements: 3.2 x 1.9 x 2.4 cm = volume: 7.7 mL. Normal appearance/no adnexal mass. Pulsed Doppler evaluation of the left ovary demonstrates normal low-resistance arterial and venous waveforms. Other findings No abnormal free fluid. IMPRESSION: 1. 1.9 cm intramural fibroid at the anterior uterine body. 2. Endometrial stripe within normal limits measuring 3.6 mm in thickness. If bleeding remains unresponsive to hormonal or medical therapy, sonohysterogram should be considered for focal lesion work-up. (Ref: Radiological Reasoning: Algorithmic Workup of Abnormal Vaginal Bleeding  with Endovaginal Sonography and Sonohysterography. AJR 2008; 537:S82-70). 3. Normal left ovary without evidence for torsion. 4. Prior right oophorectomy.  No adnexal mass or free fluid. Electronically Signed   By: Jeannine Boga M.D.   On: 06/02/2022 19:02    Procedures Procedures    Medications Ordered in ED Medications  HYDROcodone-acetaminophen (NORCO/VICODIN) 5-325 MG per tablet 1 tablet (1 tablet Oral Given 06/02/22 1823)  sodium chloride 0.9 % bolus 1,000 mL (1,000 mLs Intravenous New Bag/Given (Non-Interop) 06/02/22 1848)    ED Course/ Medical Decision Making/ A&P                           Medical Decision Making Amount and/or Complexity of Data Reviewed Labs: ordered. Radiology: ordered.  Risk Prescription drug management.   This patient presents to the ED for concern of vaginal bleeding, this involves an extensive number of treatment options, and is a complaint that carries with it a high risk of complications and morbidity.  The differential diagnosis includes Differential diagnosis includes, but is not limited to, threatened miscarriage, incomplete miscarriage, normal bleeding from an early trimester pregnancy, ectopic pregnancy, , blighted ovum, vaginal/cervical trauma, subchorionic hemorrhage/hematoma, etc.   Co morbidities that complicate the patient evaluation  See HPI   Additional history obtained:  Additional history obtained from EMR External records from outside source obtained and reviewed including CT abdomen pelvis from 05/13/2022 which showed no evidence of new metastatic disease.   Lab Tests:  I Ordered, and personally interpreted labs.  The pertinent results include:   UA without signs of abnormality.  Beta-hCG less than 5.  No leukocytosis.  No evidence of anemia.  Platelets slightly decreased with 113.  Electrolytes within normal range.  No evidence of renal dysfunction or transaminitis.  PT/INR within normal range.   Imaging Studies  ordered:  I ordered imaging studies including pelvic ultrasound I independently visualized and interpreted imaging which showed 1.9 cm intramural fibroid in the anterior uterine body.  Endometrial stripe of 3.6 mm.  Normal left ovary without evidence of torsion.  Prior right oophorectomy with no adnexal mass or free fluid. I agree with the radiologist interpretation  Cardiac Monitoring: / EKG:  The patient was maintained on a cardiac monitor.  I personally viewed and interpreted the cardiac monitored which showed an underlying rhythm of: Sinus rhythm   Consultations Obtained:  N/a   Problem List / ED Course / Critical interventions /  Medication management  Vaginal bleeding I ordered medication including Norco for pain   Reevaluation of the patient after these medicines showed that the patient improved I have reviewed the patients home medicines and have made adjustments as needed   Social Determinants of Health:  Former cigarette use 2018.  Denies alcohol illicit drug use.   Test / Admission - Considered:  Vaginal bleeding Vitals signs within normal range and stable throughout visit. Laboratory/imaging studies significant for: See above Patient's vaginal bleeding likely secondary to uterine fibroid.  Patient recommended close follow-up with OB/GYN to make sure bleeding resolves.  Treatment discussed length patient she knowledge understanding was agreeable to said plan. Worrisome signs and symptoms were discussed with the patient, and the patient acknowledged understanding to return to the ED if noticed. Patient was stable upon discharge.         Final Clinical Impression(s) / ED Diagnoses Final diagnoses:  Fibroid  Vaginal bleeding    Rx / DC Orders ED Discharge Orders     None         Wilnette Kales, [place] 06/02/22 2040    Lacretia Leigh, MD 06/09/22 1015

## 2022-06-03 ENCOUNTER — Other Ambulatory Visit: Payer: Self-pay | Admitting: Hematology

## 2022-06-03 DIAGNOSIS — N83201 Unspecified ovarian cyst, right side: Secondary | ICD-10-CM

## 2022-06-04 ENCOUNTER — Encounter: Payer: Self-pay | Admitting: Hematology

## 2022-06-04 MED ORDER — HYDROCODONE-ACETAMINOPHEN 5-325 MG PO TABS: 1.0000 | ORAL_TABLET | Freq: Four times a day (QID) | ORAL | 0 refills | Status: DC | PRN

## 2022-06-06 ENCOUNTER — Encounter: Payer: Self-pay | Admitting: Hematology

## 2022-06-07 ENCOUNTER — Other Ambulatory Visit: Payer: Self-pay

## 2022-06-08 ENCOUNTER — Other Ambulatory Visit: Payer: Self-pay

## 2022-06-09 ENCOUNTER — Other Ambulatory Visit: Payer: Self-pay | Admitting: Hematology

## 2022-06-09 ENCOUNTER — Other Ambulatory Visit (HOSPITAL_COMMUNITY): Payer: Self-pay

## 2022-06-11 ENCOUNTER — Other Ambulatory Visit (HOSPITAL_COMMUNITY): Payer: Self-pay

## 2022-06-12 ENCOUNTER — Other Ambulatory Visit: Payer: Self-pay

## 2022-06-13 ENCOUNTER — Inpatient Hospital Stay (HOSPITAL_BASED_OUTPATIENT_CLINIC_OR_DEPARTMENT_OTHER): Payer: BC Managed Care – PPO | Admitting: Hematology

## 2022-06-13 ENCOUNTER — Inpatient Hospital Stay: Payer: BC Managed Care – PPO | Attending: Nurse Practitioner

## 2022-06-13 ENCOUNTER — Encounter: Payer: Self-pay | Admitting: Hematology

## 2022-06-13 ENCOUNTER — Ambulatory Visit: Payer: BC Managed Care – PPO

## 2022-06-13 ENCOUNTER — Other Ambulatory Visit (HOSPITAL_COMMUNITY): Payer: Self-pay

## 2022-06-13 ENCOUNTER — Other Ambulatory Visit: Payer: Self-pay

## 2022-06-13 VITALS — BP 112/78 | HR 62 | Temp 98.1°F | Resp 18 | Ht 68.0 in | Wt 157.7 lb

## 2022-06-13 DIAGNOSIS — F418 Other specified anxiety disorders: Secondary | ICD-10-CM | POA: Diagnosis not present

## 2022-06-13 DIAGNOSIS — C7961 Secondary malignant neoplasm of right ovary: Secondary | ICD-10-CM | POA: Diagnosis not present

## 2022-06-13 DIAGNOSIS — C19 Malignant neoplasm of rectosigmoid junction: Secondary | ICD-10-CM | POA: Diagnosis present

## 2022-06-13 DIAGNOSIS — C779 Secondary and unspecified malignant neoplasm of lymph node, unspecified: Secondary | ICD-10-CM | POA: Diagnosis not present

## 2022-06-13 DIAGNOSIS — G620 Drug-induced polyneuropathy: Secondary | ICD-10-CM | POA: Diagnosis not present

## 2022-06-13 DIAGNOSIS — C787 Secondary malignant neoplasm of liver and intrahepatic bile duct: Secondary | ICD-10-CM | POA: Insufficient documentation

## 2022-06-13 DIAGNOSIS — Z8052 Family history of malignant neoplasm of bladder: Secondary | ICD-10-CM | POA: Insufficient documentation

## 2022-06-13 DIAGNOSIS — Z95828 Presence of other vascular implants and grafts: Secondary | ICD-10-CM

## 2022-06-13 LAB — CBC WITH DIFFERENTIAL (CANCER CENTER ONLY)
Abs Immature Granulocytes: 0.01 10*3/uL (ref 0.00–0.07)
Basophils Absolute: 0 10*3/uL (ref 0.0–0.1)
Basophils Relative: 0 %
Eosinophils Absolute: 0.1 10*3/uL (ref 0.0–0.5)
Eosinophils Relative: 2 %
HCT: 41.7 % (ref 36.0–46.0)
Hemoglobin: 14.2 g/dL (ref 12.0–15.0)
Immature Granulocytes: 0 %
Lymphocytes Relative: 27 %
Lymphs Abs: 1.1 10*3/uL (ref 0.7–4.0)
MCH: 34.6 pg — ABNORMAL HIGH (ref 26.0–34.0)
MCHC: 34.1 g/dL (ref 30.0–36.0)
MCV: 101.7 fL — ABNORMAL HIGH (ref 80.0–100.0)
Monocytes Absolute: 0.5 10*3/uL (ref 0.1–1.0)
Monocytes Relative: 11 %
Neutro Abs: 2.5 10*3/uL (ref 1.7–7.7)
Neutrophils Relative %: 60 %
Platelet Count: 125 10*3/uL — ABNORMAL LOW (ref 150–400)
RBC: 4.1 MIL/uL (ref 3.87–5.11)
RDW: 15.1 % (ref 11.5–15.5)
WBC Count: 4.1 10*3/uL (ref 4.0–10.5)
nRBC: 0 % (ref 0.0–0.2)

## 2022-06-13 LAB — CMP (CANCER CENTER ONLY)
ALT: 29 U/L (ref 0–44)
AST: 34 U/L (ref 15–41)
Albumin: 4.2 g/dL (ref 3.5–5.0)
Alkaline Phosphatase: 82 U/L (ref 38–126)
Anion gap: 7 (ref 5–15)
BUN: 8 mg/dL (ref 6–20)
CO2: 26 mmol/L (ref 22–32)
Calcium: 9.3 mg/dL (ref 8.9–10.3)
Chloride: 106 mmol/L (ref 98–111)
Creatinine: 0.59 mg/dL (ref 0.44–1.00)
GFR, Estimated: >60 mL/min (ref >60)
Glucose, Bld: 102 mg/dL — ABNORMAL HIGH (ref 70–99)
Potassium: 4.2 mmol/L (ref 3.5–5.1)
Sodium: 139 mmol/L (ref 135–145)
Total Bilirubin: 0.5 mg/dL (ref 0.3–1.2)
Total Protein: 6.8 g/dL (ref 6.5–8.1)

## 2022-06-13 LAB — CEA (IN HOUSE-CHCC): CEA (CHCC-In House): 4.32 ng/mL (ref 0.00–5.00)

## 2022-06-13 MED ORDER — SODIUM CHLORIDE 0.9% FLUSH
10.0000 mL | Freq: Once | INTRAVENOUS | Status: AC
  Administered 2022-06-13: 10 mL

## 2022-06-13 MED ORDER — HEPARIN SOD (PORK) LOCK FLUSH 100 UNIT/ML IV SOLN
500.0000 [IU] | Freq: Once | INTRAVENOUS | Status: AC
  Administered 2022-06-13: 500 [IU]

## 2022-06-13 NOTE — Progress Notes (Signed)
[place]   [phone] Fax:([phone]) [phone]   Clinic Follow up Note   Patient Care Team: [facility], [place] as PCP - General [name], MD as Consulting Physician (Oncology) [name], MD as Consulting Physician (General Surgery) [name], NP as Nurse Practitioner (Nurse Practitioner) Griffin Basil, MD as Consulting Physician (Obstetrics and Gynecology)  Date of Service:  06/13/2022  CHIEF COMPLAINT: f/u of metastatic colon cancer  CURRENT THERAPY:  -Xeloda, q21d, starting 03/28/22, currently 1500mg  am and 1000mg  pm day 1-14 from 05/19/22   ASSESSMENT & PLAN:  Stephanie [name] is a 41 y.o. 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 She was found to have perforation and positive radial margin. Liver biopsy on 04/16/20 confirmed metastatic disease from her colon cancer -[facility] which showed K-ras mutation, she is not a candidate for EGFR inhibitor -first line chemo with FOLFOXIRI q2 weeks from 05/23/20 - 10/24/20. Bevacizumab added with C2. Oxaliplatin held for last 2 cycles after infusion reaction during C11. -s/p liver resection on 12/13/20 under Dr. Barry Dienes. Pathology showed: metastatic colon carcinoma to liver, resection margin negative.  -s/p colostomy takedown on 05/30/21 with Dr. Barry Dienes. Pathology was benign. -surveillance CT showed a new right ovarian/adnexal cyst in 09/2021 (not seen 07/2021). She underwent right ovary removal 02/25/22 with Dr. Polly Cobia. Path revealed adenocarcinoma, likely colon origin.  -given her high risk of recurrence, she started CAPEOX on 03/28/22. She has tolerated fairly well over all but developed worsening skin toxicity after cycle 2. Tolerating Xeloda better  with dose reduction. -her CEA on 04/18/22 was slightly elevated to 5.22, stable on 05/09/22 at 5.72. -restaging CT CAP on 05/13/22 was stable and NED. -during oxaliplatin infusion on 05/23/22, she had a significant reaction, with flushing, chest tightness and SOB, and severe back pain. She previously had a similar reaction during oxali for C11 FOLFOXIRI. Given this and her history of adverse side effects, I do not plan to give this to her again. -labs reviewed, plt improved to 125k, otherwise WNL. CEA is pending, I will call her with the results. We will continue Xeloda given her good tolerance with reduced dose. She is on 2500mg /day now, dose reduced due to skin toxicities  -plan to repeat scan in Nov, or sooner if her CEA trending up rapidly    2. Social Support, Anxiety and Depression -her recent home situation has resolved, but she is still dealing with custody issues/issues with her daughter's father.   3.  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 More so in her feet, but she does note some functional deficits in her fingers. -stable     Plan: -continue Xeloda, start next cycle Monday, 9/18 -lab, flush, and f/u 10/6 and 10/27 as scheduled   No problem-specific Assessment & Plan notes found for this encounter.   SUMMARY OF ONCOLOGIC HISTORY: Oncology History Overview Note  Cancer Staging Malignant neoplasm of rectosigmoid junction [facility]) Staging form: Colon and Rectum, AJCC 8th Edition - Pathologic stage from 04/04/2020: pT4a, pN1b, cM1 - Signed by [name], NP on 05/07/2020    Malignant neoplasm of rectosigmoid junction ([place])  11/10/2019 Imaging   MRI Abdomen  IMPRESSION: 1. Redemonstrated hypoenhancing lesion of the posterior liver dome,  hepatic segment VII, reduced in size compared to prior examination, measuring 1.3 x 1.3 cm, previously 1.8 x 1.7 cm. Findings are consistent with treatment response of a biopsy  proven metastasis. No other evidence of lymphadenopathy or metastatic disease within the abdomen or pelvis. 2. Unchanged mild splenomegaly, maximum coronal span 14.0 cm. 3. 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   03/12/2020 Initial Diagnosis   Malignant neoplasm of rectosigmoid junction ([place])   03/12/2020 Imaging   CT AP with contrast IMPRESSION: 1. Overall findings are highly concerning for colorectal carcinoma involving the sigmoid colon with an associated perforation and adjacent abscess and phlegmon formation as detailed above. Currently, no collection is amenable to percutaneous drainage given their small size and location. 2. New 2 cm mass in the right hepatic lobe concerning for metastatic disease to the liver until proven otherwise. 3. Enlarged regional lymph nodes as detailed above is concerning for nodal metastatic disease. 4. Large stool burden. 5. Prominent pelvic veins which can be seen in patients with pelvic congestion syndrome.   03/13/2020 Imaging   ABD US IMPRESSION: Approximately 2.1 x 2.4 x 2.0 cm lobular homogeneously echogenic lesion in the right hepatic dome corresponds with the abnormality seen on the prior CT scan. Sonographically, this appearance is highly suggestive of a benign hemangioma.   Recommend MRI of the abdomen with gadolinium contrast which may provide a noninvasive diagnosis of benign hemangioma.    03/13/2020 Imaging   MR ABD W/WO CONTRAST Hepatobiliary: Diffuse low signal intensity throughout the hepatic parenchyma on T2 weighted images, presumably a consequence of recent Feraheme injection. In segment 7 of the liver (axial image 8 of series 5) there is a 2.5 x 1.9 cm well-defined lesion which is slightly T2 hyperintense. This lesion appears hyperintense on pre gadolinium T1 weighted images (likely a consequence of Feraheme). Interpretation of enhancement within the lesion is compromised by presence of  Feraheme. No other hepatic lesions are confidently identified on today's examination. No intra or extrahepatic biliary ductal dilatation. Gallbladder is normal in appearance.   03/31/2020 Imaging   CT AP W contrast IMPRESSION: 1. Previously noted sigmoid colon mass appears increased in size, and again appears to be associated with a focal contained perforation which crosses the midline and has fistulized into the left adnexal region where there is now what appears to be a large left tubo-ovarian abscess, as detailed above. This is also associated with multiple enlarged lymph nodes in the pelvis measuring up to 1.2 cm in short axis and borderline enlarged retroperitoneal lymph nodes, concerning for metastatic disease. In addition, previously suspected metastatic lesion in segment 7 of the liver has enlarged. 2. Small volume of ascites. 3. Additional incidental findings, as above.   04/04/2020 Cancer Staging   Staging form: Colon and Rectum, AJCC 8th Edition - Pathologic stage from 04/04/2020: pT4a, pN1b, cM1 - Signed by [name], NP on 05/07/2020   04/04/2020 Procedure   Paracentesis, path showed no malignant cells (mixed acute and chronic inflammation present)   04/04/2020 Surgery   Open sigmoid colectomy and end colostomy by Dr. [name]   04/04/2020 Pathology Results   FINAL MICROSCOPIC DIAGNOSIS: A. COLON, RECTOSIGMOID, RESECTION: - Invasive moderately differentiated adenocarcinoma, 6 cm, involving rectosigmoid junction - Carcinoma invades into serosal surface with perforation and associated serositis - Radial resection margin is positive for carcinoma; proximal and distal margins are not involved - Lymphovascular invasion is present - Metastatic carcinoma to one of fifteen lymph nodes (1/15); one tumor  deposit - See oncology table B. LYMPH NODES, MESENTERIC, RESECTION: - Metastatic adenocarcinoma to one of six lymph nodes (1/6) - One tumor deposit  Addendum to note 2  involved lymph nodes (of 21 examined nodes) pT4a,pN1b MMR-normal, preserved expression of MLH1, MSH2, MSH6, PMS2   04/12/2020 Procedure   PAC placement    04/13/2020 Imaging   CT chest without contrast IMPRESSION: Interval development of bilateral pleural effusions, left slightly greater than right, with resultant bibasilar atelectasis including subtotal collapse of the left lower lobe. No evidence of intrathoracic metastatic disease, though evaluation of the collapsed parenchyma is limited. Hepatic metastasis again demonstrated.     04/16/2020 Pathology Results   FINAL MICROSCOPIC DIAGNOSIS:  A. LIVER, RIGHT LOBE, BIOPSY:  - Adenocarcinoma.  COMMENT:  The morphology is compatible with the provided clinical history of colorectal carcinoma.    05/23/2020 - 10/24/2020 Chemotherapy   FOLFIRINOX q2weeks for 3-6 months starting 05/23/20. Bevacizumab-bvzr [name] added with C2. Oxaliplatin held with C11-12 due to reaction. (pt developed SOB, chest palpitation and abdominal discomfort shortly after oxaliplatin started). Completed on 10/24/20.   08/05/2020 Imaging   CT AP  IMPRESSION: 1. Postsurgical changes of distal colectomy with a left lower quadrant end ostomy. No evidence of obstruction or acute complication at this time. Excluded rectal pouch in the deep pelvis without acute complication or worrisome features. 2. Slight interval decrease in size of a hypoattenuating lesion posterior right lobe liver measuring 1.7 x 1.8 x 2 cm. This lesion has previously undergone ultrasound-guided biopsy with pathologic results demonstrating adenocarcinoma compatible with metastatic disease from patient's resected colorectal carcinoma. 3. Slight prominence of the parametrial vessels bilaterally, nonspecific though can be seen in the setting of pelvic congestion syndrome. 4. Mild splenomegaly.  No focal lesion.     11/12/2020 Imaging   CT Chest  IMPRESSION: 1. No evidence of metastatic disease  in the chest. 2. 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   12/13/2020 Surgery   A. 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 The report date is 01/28/2021.   The CustomNext-Cancer+RNAinsight panel offered by [facility] included sequencing and rearrangement analysis for the following 47 genes:  APC, ATM, AXIN2, BARD1, BMPR1A, BRCA1, BRCA2, BRIP1, CDH1, CDK4, CDKN2A, CHEK2, DICER1, EPCAM, GREM1, HOXB13, MEN1, MLH1, MSH2, MSH3, MSH6, MUTYH, NBN, NF1, NF2, NTHL1, PALB2, PMS2, POLD1, POLE, PTEN, RAD51C, RAD51D, RECQL, RET, SDHA, SDHAF2, SDHB, SDHC, SDHD, SMAD4, SMARCA4, STK11, TP53, TSC1, TSC2, and VHL.  RNA data is routinely analyzed for use in variant interpretation for all genes.   02/19/2021 Imaging   CT A/P w/o contrast  IMPRESSION: Slightly limited examination examination in absence of contrast administration. Status post partial right hepatectomy. Interval decrease in size in perihepatic fluid collection and resolution of right subdiaphragmatic fluid and gas. No new intra-abdominal fluid collections are identified.   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 Moderate stool throughout the colon without evidence of obstruction.   Fluid distension of the proximal duodenum to the level of the SMA hiatus which appears narrow. 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   05/05/2021 Imaging   CT  AP  IMPRESSION: Postsurgical changes as described stable in appearance from the prior exam.   Changes suggestive of mild pelvic varices.   08/22/2021 Imaging   EXAM: CT ABDOMEN AND PELVIS WITH CONTRAST  IMPRESSION: 1. Extensive bowel content is identified throughout the colon consistent with constipation. 2. 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 3. Stable postoperative changes in the right lobe liver.   09/12/2021 Survivorship   SCP delivered by Cira Rue, NP   10/05/2021 Imaging   EXAM: CT ABDOMEN AND PELVIS WITH CONTRAST  IMPRESSION: 1. Right ovarian/adnexal cyst, 5.2 cm. Because this lesion is not adequately characterized, prompt [place] is recommended for further evaluation. Note: This recommendation does not apply to premenarchal patients and to those with increased risk (genetic, family history, elevated tumor markers or other high-risk factors) of ovarian cancer. Reference: JACR 2020 Feb; 17(2):248-254 2. No other evidence of an acute abnormality within the abdomen or pelvis. 3. Moderate increase in the colonic and rectal stool burden. No bowel obstruction or inflammation. 4. No evidence of locally recurrent or metastatic colon carcinoma.   11/12/2021 Imaging   EXAM: CT CHEST WITHOUT CONTRAST  IMPRESSION: 1. No evidence of pulmonary metastatic disease. 2. Stable postoperative findings about the posterior right lobe of the liver.   03/28/2022 - 04/18/2022 Chemotherapy   Patient is on Treatment Plan : COLORECTAL Xelox (Capeox) q21d     05/23/2022 - 05/23/2022 Chemotherapy   Patient is on Treatment Plan : COLORECTAL CAPEOX (130/850) q21d x 8 cycles        INTERVAL HISTORY:  Stephanie [name] is here for a follow up of metastatic colon cancer. She was last seen by Dr. Alvy Bimler on 05/23/22 due to infusion reaction. She presents to the clinic alone. She reports she has recovered well from last infusion. She tells me  her home situation has been resolved, which is a great relief to her. She notes she is still dealing with her daughter's father and is needing to go to court for this. (Her daughter is 56, so this is a bit of a blessing in disguise for her!)   All other systems were reviewed with the patient and are negative.  MEDICAL HISTORY:  Past Medical History:  Diagnosis Date   Anxiety    Blood transfusion without reported diagnosis    Bowel obstruction (HCC)    BV (bacterial vaginosis)    Colon cancer ([place])    Colon Cancer 03-2020   Colon polyps    Colostomy present [facility])    03-2020   Family history of bladder cancer    History of chemotherapy    ended 09-2020   Lactose intolerance 03/12/2020   Neuromuscular disorder (HCC)    neuropathy feet hands legs   UTI (lower urinary tract infection)     SURGICAL HISTORY: Past Surgical History:  Procedure Laterality Date   CESAREAN SECTION     CHOLECYSTECTOMY  12/13/2020   COLECTOMY  03/2020   COLONOSCOPY     COLOSTOMY TAKEDOWN N/A 05/30/2021   Procedure: LAPAROSCOPIC ASSISTED COLOSTOMY TAKEDOWN;  Surgeon: [name], MD;  Location: [place];  Service: General;  Laterality: N/A;   CYSTOSCOPY WITH STENT PLACEMENT  04/04/2020   Procedure: CYSTOSCOPY WITH STENT PLACEMENT;  Surgeon: [name], MD;  Location: [facility];  Service: General;;   LAPAROSCOPIC LIVER ULTRASOUND N/A 12/13/2020   Procedure: INTRAOPERATIVE LIVER ULTRASOUND;  Surgeon:  [name], MD;  Location: [place];  Service: General;  Laterality: N/A;   LAPAROSCOPY N/A 12/13/2020   Procedure: LAPAROSCOPY DIAGNOSTIC;  Surgeon: [name], MD;  Location: [place];  Service: General;  Laterality: N/A;   LAPAROTOMY N/A 04/04/2020   Procedure: EXPLORATORY LAPAROTOMY;  Surgeon: [name], MD;  Location: [facility];  Service: General;  Laterality: N/A;   OPEN PARTIAL HEPATECTOMY  N/A 12/13/2020   Procedure: OPEN PARTIAL HEPATECTOMY;  Surgeon: [name], MD;  Location: [place];  Service: General;   Laterality: N/A;  ROOM 2 STARTING AT 09:30AM FOR 300 MIN   PORTACATH PLACEMENT Right 04/12/2020   Procedure: INSERTION PORT-A-CATH WITH ULTRASOUND;  Surgeon: Kieth Brightly Arta Bruce, MD;  Location: [facility];  Service: General;  Laterality: Right;    I have reviewed the social history and family history with the patient and they are unchanged from previous note.  ALLERGIES:  is allergic to oxaliplatin.  MEDICATIONS:  Current Outpatient Medications  Medication Sig Dispense Refill   acetaminophen (TYLENOL) 325 MG tablet Take 2 tablets (650 mg total) by mouth every 6 (six) hours as needed for mild pain, moderate pain, fever or headache (fever > 101).     AUROVELA 1.5/30 1.5-30 MG-MCG tablet TAKE ONE TABLET BY MOUTH DAILY 21 tablet 6   capecitabine (XELODA) 500 MG tablet Take 3 tablets in morning and 2 tablets in evening, every 12 hours.  Take Take for 14 days on, then off 7 days. Repeat every 21 days. 70 tablet 1   HYDROcodone-acetaminophen (NORCO/VICODIN) 5-325 MG tablet Take 1 tablet by mouth every 6 (six) hours as needed for severe pain. 10 tablet 0   ibuprofen (ADVIL) 600 MG tablet Take 1 tablet (600 mg total) by mouth every 6 (six) hours as needed for headache, mild pain, moderate pain or cramping. 30 tablet 2   Lactic Ac-Citric Ac-Pot Bitart (PHEXXI) 1.8-1-0.4 % GEL Place 5 g vaginally as needed. 60 g 1   meloxicam (MOBIC) 7.5 MG tablet Take 1 tablet (7.5 mg total) by mouth daily. 20 tablet 0   metroNIDAZOLE (FLAGYL) 500 MG tablet Take 1 tablet (500 mg total) by mouth 3 (three) times daily. 21 tablet 0   ondansetron (ZOFRAN) 8 MG tablet TAKE 1 TABLET(8 MG) BY MOUTH EVERY 8 HOURS AS NEEDED FOR NAUSEA OR VOMITING 30 tablet 2   Prenatal Vit-Fe Fumarate-FA (PRENATAL MULTIVITAMIN) TABS tablet Take 1 tablet by mouth daily at 12 noon.     prochlorperazine (COMPAZINE) 10 MG tablet Take 1 tablet (10 mg total) by mouth every 6 (six) hours as needed (Nausea or vomiting). 30 tablet 1    sulfamethoxazole-trimethoprim (BACTRIM DS) 800-160 MG tablet Take 1 tablet by mouth 2 (two) times daily.     SUTAB [phone] MG TABS Take by mouth.     topiramate (TOPAMAX) 50 MG tablet Take 50 mg by mouth daily.     traMADol (ULTRAM) 50 MG tablet Take 1 tablet (50 mg total) by mouth every 6 (six) hours as needed. 30 tablet 0   urea (CARMOL) 10 % cream Apply topically 2 (two) times daily. 71 g 0   No current facility-administered medications for this visit.    PHYSICAL EXAMINATION: ECOG PERFORMANCE STATUS: 0 - Asymptomatic  Vitals:   06/13/22 0907  BP: 112/78  Pulse: 62  Resp: 18  Temp: 98.1 F (36.7 C)  SpO2: 100%   Wt Readings from Last 3 Encounters:  06/13/22 157 lb 11.2 oz (71.5 kg)  06/02/22 150 lb (68 kg)  05/09/22 154 lb 9.6 oz (70.1 kg)    GENERAL:alert, no distress and comfortable SKIN: skin color normal, no rashes or significant lesions EYES: normal, Conjunctiva are pink and non-injected, sclera clear  NEURO: alert & oriented x 3 with fluent speech  LABORATORY DATA:  I have reviewed the data as listed    Latest Ref Rng & Units 06/13/2022    8:57 AM 06/02/2022    5:47 PM 05/23/2022    9:46 AM  CBC  WBC 4.0 - 10.5 K/uL 4.1  4.3  5.6   Hemoglobin 12.0 - 15.0 g/dL 14.2  13.2  14.3   Hematocrit 36.0 - 46.0 % 41.7  39.2  40.7   Platelets 150 - 400 K/uL 125  113  107         Latest Ref Rng & Units 06/02/2022    5:47 PM 05/23/2022    9:46 AM 05/16/2022   10:27 AM  CMP  Glucose 70 - 99 mg/dL 131  118  103   BUN 6 - 20 mg/dL $Remove'15  16  14   'UKuLCbs$ Creatinine 0.44 - 1.00 mg/dL 0.77  0.54  0.73   Sodium 135 - 145 mmol/L 141  141  137   Potassium 3.5 - 5.1 mmol/L 3.8  3.7  4.3   Chloride 98 - 111 mmol/L 111  111  104   CO2 22 - 32 mmol/L $RemoveB'26  26  28   'DApdNFev$ Calcium 8.9 - 10.3 mg/dL 8.9  8.8  9.6   Total Protein 6.5 - 8.1 g/dL 6.3  6.4  6.6   Total Bilirubin 0.3 - 1.2 mg/dL 0.4  0.4  0.7   Alkaline Phos 38 - 126 U/L 54  61  56   AST 15 - 41 U/L $Remo'19  18  21   'FAMyo$ ALT 0 - 44 U/L $Remo'19   21  28       'neBJU$ RADIOGRAPHIC STUDIES: I have personally reviewed the radiological images as listed and agreed with the findings in the report. No results found.    No orders of the defined types were placed in this encounter.  All questions were answered. The patient knows to call the clinic with any problems, questions or concerns. No barriers to learning was detected. The total time spent in the appointment was 30 minutes.     [name], MD 06/13/2022   I, Wilburn Mylar, am acting as scribe for [name], MD.   I have reviewed the above documentation for accuracy and completeness, and I agree with the above.

## 2022-06-14 ENCOUNTER — Other Ambulatory Visit (HOSPITAL_COMMUNITY): Payer: Self-pay

## 2022-06-14 ENCOUNTER — Other Ambulatory Visit: Payer: Self-pay

## 2022-06-27 ENCOUNTER — Other Ambulatory Visit (HOSPITAL_COMMUNITY): Payer: Self-pay

## 2022-06-30 ENCOUNTER — Other Ambulatory Visit: Payer: Self-pay

## 2022-07-01 ENCOUNTER — Other Ambulatory Visit (HOSPITAL_COMMUNITY): Payer: Self-pay

## 2022-07-01 ENCOUNTER — Other Ambulatory Visit: Payer: Self-pay | Admitting: Hematology

## 2022-07-01 ENCOUNTER — Telehealth: Payer: Self-pay | Admitting: *Deleted

## 2022-07-01 ENCOUNTER — Other Ambulatory Visit: Payer: Self-pay

## 2022-07-01 DIAGNOSIS — C19 Malignant neoplasm of rectosigmoid junction: Secondary | ICD-10-CM

## 2022-07-01 MED ORDER — CAPECITABINE 500 MG PO TABS
ORAL_TABLET | ORAL | 1 refills | Status: DC
  Filled 2022-07-01: qty 70, 21d supply, fill #0
  Filled 2022-08-06: qty 70, 21d supply, fill #1

## 2022-07-01 NOTE — Telephone Encounter (Signed)
Pt called c/o blisters on feet due to Xeloda.  Pt stated she only has one dose left.  Per Cira Rue, NP pt should not take last dose of Xeloda.  Pt should use prescribed urea cream and OTC hydrocortisone cream, and rotate use every couple of hours.  Pt verbalized understanding.  No additional questions.

## 2022-07-04 ENCOUNTER — Inpatient Hospital Stay: Payer: BC Managed Care – PPO

## 2022-07-04 ENCOUNTER — Inpatient Hospital Stay: Payer: BC Managed Care – PPO | Attending: Hematology | Admitting: Hematology

## 2022-07-04 ENCOUNTER — Other Ambulatory Visit: Payer: Self-pay

## 2022-07-04 ENCOUNTER — Encounter: Payer: Self-pay | Admitting: Hematology

## 2022-07-04 VITALS — BP 122/81 | HR 68 | Temp 98.0°F | Resp 16 | Wt 159.3 lb

## 2022-07-04 DIAGNOSIS — I498 Other specified cardiac arrhythmias: Secondary | ICD-10-CM | POA: Insufficient documentation

## 2022-07-04 DIAGNOSIS — C7961 Secondary malignant neoplasm of right ovary: Secondary | ICD-10-CM | POA: Diagnosis not present

## 2022-07-04 DIAGNOSIS — Z95828 Presence of other vascular implants and grafts: Secondary | ICD-10-CM

## 2022-07-04 DIAGNOSIS — G620 Drug-induced polyneuropathy: Secondary | ICD-10-CM | POA: Insufficient documentation

## 2022-07-04 DIAGNOSIS — C787 Secondary malignant neoplasm of liver and intrahepatic bile duct: Secondary | ICD-10-CM | POA: Insufficient documentation

## 2022-07-04 DIAGNOSIS — C19 Malignant neoplasm of rectosigmoid junction: Secondary | ICD-10-CM | POA: Insufficient documentation

## 2022-07-04 LAB — CBC WITH DIFFERENTIAL (CANCER CENTER ONLY)
Abs Immature Granulocytes: 0.02 10*3/uL (ref 0.00–0.07)
Basophils Absolute: 0 10*3/uL (ref 0.0–0.1)
Basophils Relative: 0 %
Eosinophils Absolute: 0.1 10*3/uL (ref 0.0–0.5)
Eosinophils Relative: 2 %
HCT: 40.1 % (ref 36.0–46.0)
Hemoglobin: 14 g/dL (ref 12.0–15.0)
Immature Granulocytes: 0 %
Lymphocytes Relative: 17 %
Lymphs Abs: 1 10*3/uL (ref 0.7–4.0)
MCH: 34.6 pg — ABNORMAL HIGH (ref 26.0–34.0)
MCHC: 34.9 g/dL (ref 30.0–36.0)
MCV: 99 fL (ref 80.0–100.0)
Monocytes Absolute: 0.4 10*3/uL (ref 0.1–1.0)
Monocytes Relative: 8 %
Neutro Abs: 4.2 10*3/uL (ref 1.7–7.7)
Neutrophils Relative %: 73 %
Platelet Count: 112 10*3/uL — ABNORMAL LOW (ref 150–400)
RBC: 4.05 MIL/uL (ref 3.87–5.11)
RDW: 14.3 % (ref 11.5–15.5)
WBC Count: 5.7 10*3/uL (ref 4.0–10.5)
nRBC: 0 % (ref 0.0–0.2)

## 2022-07-04 LAB — CMP (CANCER CENTER ONLY)
ALT: 20 U/L (ref 0–44)
AST: 19 U/L (ref 15–41)
Albumin: 4 g/dL (ref 3.5–5.0)
Alkaline Phosphatase: 49 U/L (ref 38–126)
Anion gap: 5 (ref 5–15)
BUN: 11 mg/dL (ref 6–20)
CO2: 27 mmol/L (ref 22–32)
Calcium: 8.5 mg/dL — ABNORMAL LOW (ref 8.9–10.3)
Chloride: 104 mmol/L (ref 98–111)
Creatinine: 0.72 mg/dL (ref 0.44–1.00)
GFR, Estimated: >60 mL/min (ref >60)
Glucose, Bld: 137 mg/dL — ABNORMAL HIGH (ref 70–99)
Potassium: 3.7 mmol/L (ref 3.5–5.1)
Sodium: 136 mmol/L (ref 135–145)
Total Bilirubin: 0.5 mg/dL (ref 0.3–1.2)
Total Protein: 6.5 g/dL (ref 6.5–8.1)

## 2022-07-04 LAB — CEA (IN HOUSE-CHCC): CEA (CHCC-In House): <1 ng/mL (ref 0.00–5.00)

## 2022-07-04 MED ORDER — SODIUM CHLORIDE 0.9% FLUSH
10.0000 mL | Freq: Once | INTRAVENOUS | Status: AC
  Administered 2022-07-04: 10 mL

## 2022-07-04 MED ORDER — HEPARIN SOD (PORK) LOCK FLUSH 100 UNIT/ML IV SOLN
500.0000 [IU] | Freq: Once | INTRAVENOUS | Status: AC
  Administered 2022-07-04: 500 [IU]

## 2022-07-04 MED ORDER — PROCHLORPERAZINE EDISYLATE 10 MG/2ML IJ SOLN: 10.0000 mg | Freq: Once | INTRAMUSCULAR | Status: DC

## 2022-07-04 NOTE — Progress Notes (Signed)
[place]   [phone] [phone]   Clinic Follow up Note   Patient Care Team: [facility] as PCP - General [name], MD as Consulting Physician (Oncology) [name], MD as Consulting Physician (General Surgery) [name], NP as Nurse Practitioner (Nurse Practitioner) Griffin Basil, MD as Consulting Physician (Obstetrics and Gynecology)  Date of Service:  07/04/2022  CHIEF COMPLAINT: f/u of metastatic colon cancer  CURRENT THERAPY:  -Xeloda, q21d, starting 03/28/22, currently 1532m am and 10034mpm day 1-14 from 05/19/22   ASSESSMENT & PLAN:  Stephanie [name] a [age].o. female with   1. Adenocarcinoma of the rectosigmoid colon, grade 2, [id number] IV with oligo liver metastasis; MMR normal, KRAS (+), right ovarian metastasis in 01/2022  -presented with worsening abdominal pain and abdominal abscess, s/p urgent open sigmoid colectomy and end colostomy on 04/04/20. She was found to have perforation and positive radial margin. Liver biopsy on 04/16/20 confirmed metastatic disease from her colon cancer -[facility] which showed K-ras mutation, she is not a candidate for EGFR inhibitor -first line chemo with FOLFOXIRI q2 weeks from 05/23/20 - 10/24/20. Bevacizumab added with C2. Oxaliplatin held for last 2 cycles after infusion reaction during C11. -s/p liver resection on 12/13/20 under Dr. ByBarry DienesPathology showed: metastatic colon carcinoma to liver, resection margin negative.  -s/p colostomy takedown on 05/30/21 with Dr. ByBarry DienesPathology was benign. -surveillance CT showed a new right ovarian/adnexal cyst in 09/2021 (not seen 07/2021). She underwent right ovary removal 02/25/22 with Dr. SkPolly CobiaPath revealed adenocarcinoma, likely colon origin.  -given her high risk of recurrence, she started CAPEOX on 03/28/22. She has tolerated fairly well over all but developed worsening skin toxicity after cycle 2. Tolerating Xeloda  better with dose reduction. -her CEA on 04/18/22 was slightly elevated to 5.22, stable on 05/09/22 at 5.72. -restaging CT CAP on 05/13/22 was stable and NED. -during oxaliplatin infusion on 05/23/22, she had a significant reaction, with flushing, chest tightness and SOB, and severe back pain. She previously had a similar reaction during oxali for C11 FOLFOXIRI. Given this and her history of adverse side effects, I do not plan to give this to her again. -CEA returned to WNL on 06/13/22; today's result is pending. -labs reviewed, plt overall stable at 112k, otherwise WNL. CEA is pending, I will call her with the results. We will continue Xeloda given her good tolerance with reduced dose. She is on 250015may now, dose reduced due to skin toxicities  -plan to repeat CT scan before next visit   2. Social Support, Anxiety and Depression -her recent home situation has resolved, but she is still dealing with custody issues/issues with her daughter's father.   3.  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 More so in her feet, but she does note some functional deficits in her fingers. -stable     Plan: -continue Xeloda at same dose, she will start next cycle on 10/9 -restaging CT to be done a few days before next visit -lab, flush, and f/u on 10/27 as scheduled   No problem-specific Assessment & Plan notes found for this encounter.   SUMMARY OF ONCOLOGIC HISTORY: Oncology History Overview Note  Cancer Staging [facility] form: Colon and Rectum, AJCC 8th Edition - Pathologic stage from 04/04/2020: pT4a, pN1b, cM1 - Signed by BurAlla FeelingP on 05/07/2020    Malignant neoplasm of rectosigmoid  junction ([place])  11/10/2019 Imaging   MRI Abdomen  IMPRESSION: 1. Redemonstrated hypoenhancing lesion of the posterior liver dome, hepatic segment VII, reduced in size compared to prior  examination, measuring 1.3 x 1.3 cm, previously 1.8 x 1.7 cm. Findings are consistent with treatment response of a biopsy proven metastasis. No other evidence of lymphadenopathy or metastatic disease within the abdomen or pelvis. 2. Unchanged mild splenomegaly, maximum coronal span 14.0 cm. 3. 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   03/12/2020 Initial Diagnosis   Malignant neoplasm of rectosigmoid junction ([place])   03/12/2020 Imaging   CT AP with contrast IMPRESSION: 1. Overall findings are highly concerning for colorectal carcinoma involving the sigmoid colon with an associated perforation and adjacent abscess and phlegmon formation as detailed above. Currently, no collection is amenable to percutaneous drainage given their small size and location. 2. New 2 cm mass in the right hepatic lobe concerning for metastatic disease to the liver until proven otherwise. 3. Enlarged regional lymph nodes as detailed above is concerning for nodal metastatic disease. 4. Large stool burden. 5. Prominent pelvic veins which can be seen in patients with pelvic congestion syndrome.   03/13/2020 Imaging   ABD US IMPRESSION: Approximately 2.1 x 2.4 x 2.0 cm lobular homogeneously echogenic lesion in the right hepatic dome corresponds with the abnormality seen on the prior CT scan. Sonographically, this appearance is highly suggestive of a benign hemangioma.   Recommend MRI of the abdomen with gadolinium contrast which may provide a noninvasive diagnosis of benign hemangioma.    03/13/2020 Imaging   MR ABD W/WO CONTRAST Hepatobiliary: Diffuse low signal intensity throughout the hepatic parenchyma on T2 weighted images, presumably a consequence of recent Feraheme injection. In segment 7 of the liver (axial image 8 of series 5) there is a 2.5 x 1.9 cm well-defined lesion which is slightly T2 hyperintense. This lesion appears hyperintense on pre gadolinium T1 weighted  images (likely a consequence of Feraheme). Interpretation of enhancement within the lesion is compromised by presence of Feraheme. No other hepatic lesions are confidently identified on today's examination. No intra or extrahepatic biliary ductal dilatation. Gallbladder is normal in appearance.   03/31/2020 Imaging   CT AP W contrast IMPRESSION: 1. Previously noted sigmoid colon mass appears increased in size, and again appears to be associated with a focal contained perforation which crosses the midline and has fistulized into the left adnexal region where there is now what appears to be a large left tubo-ovarian abscess, as detailed above. This is also associated with multiple enlarged lymph nodes in the pelvis measuring up to 1.2 cm in short axis and borderline enlarged retroperitoneal lymph nodes, concerning for metastatic disease. In addition, previously suspected metastatic lesion in segment 7 of the liver has enlarged. 2. Small volume of ascites. 3. Additional incidental findings, as above.   04/04/2020 Cancer Staging   Staging form: Colon and Rectum, AJCC 8th Edition - Pathologic stage from 04/04/2020: pT4a, pN1b, cM1 - Signed by [name], NP on 05/07/2020   04/04/2020 Procedure   Paracentesis, path showed no malignant cells (mixed acute and chronic inflammation present)   04/04/2020 Surgery   Open sigmoid colectomy and end colostomy by Dr. [name]   04/04/2020 Pathology Results   FINAL MICROSCOPIC DIAGNOSIS: A. COLON, RECTOSIGMOID, RESECTION: - Invasive moderately differentiated adenocarcinoma, 6 cm, involving rectosigmoid junction - Carcinoma invades into serosal surface with perforation and associated serositis - Radial resection margin is positive for carcinoma; proximal and distal margins  are not involved - Lymphovascular invasion is present - Metastatic carcinoma to one of fifteen lymph nodes (1/15); one tumor deposit - See oncology table B. LYMPH NODES,  MESENTERIC, RESECTION: - Metastatic adenocarcinoma to one of six lymph nodes (1/6) - One tumor deposit  Addendum to note 2 involved lymph nodes (of 21 examined nodes) pT4a,pN1b MMR-normal, preserved expression of MLH1, MSH2, MSH6, PMS2   04/12/2020 Procedure   PAC placement    04/13/2020 Imaging   CT chest without contrast IMPRESSION: Interval development of bilateral pleural effusions, left slightly greater than right, with resultant bibasilar atelectasis including subtotal collapse of the left lower lobe. No evidence of intrathoracic metastatic disease, though evaluation of the collapsed parenchyma is limited. Hepatic metastasis again demonstrated.     04/16/2020 Pathology Results   FINAL MICROSCOPIC DIAGNOSIS:  A. LIVER, RIGHT LOBE, BIOPSY:  - Adenocarcinoma.  COMMENT:  The morphology is compatible with the provided clinical history of colorectal carcinoma.    05/23/2020 - 10/24/2020 Chemotherapy   FOLFIRINOX q2weeks for 3-6 months starting 05/23/20. Bevacizumab-bvzr [name] added with C2. Oxaliplatin held with C11-12 due to reaction. (pt developed SOB, chest palpitation and abdominal discomfort shortly after oxaliplatin started). Completed on 10/24/20.   08/05/2020 Imaging   CT AP  IMPRESSION: 1. Postsurgical changes of distal colectomy with a left lower quadrant end ostomy. No evidence of obstruction or acute complication at this time. Excluded rectal pouch in the deep pelvis without acute complication or worrisome features. 2. Slight interval decrease in size of a hypoattenuating lesion posterior right lobe liver measuring 1.7 x 1.8 x 2 cm. This lesion has previously undergone ultrasound-guided biopsy with pathologic results demonstrating adenocarcinoma compatible with metastatic disease from patient's resected colorectal carcinoma. 3. Slight prominence of the parametrial vessels bilaterally, nonspecific though can be seen in the setting of pelvic congestion syndrome. 4.  Mild splenomegaly.  No focal lesion.     11/12/2020 Imaging   CT Chest  IMPRESSION: 1. No evidence of metastatic disease in the chest. 2. 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   12/13/2020 Surgery   A. 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 The report date is 01/28/2021.   The CustomNext-Cancer+RNAinsight panel offered by [facility] included sequencing and rearrangement analysis for the following 47 genes:  APC, ATM, AXIN2, BARD1, BMPR1A, BRCA1, BRCA2, BRIP1, CDH1, CDK4, CDKN2A, CHEK2, DICER1, EPCAM, GREM1, HOXB13, MEN1, MLH1, MSH2, MSH3, MSH6, MUTYH, NBN, NF1, NF2, NTHL1, PALB2, PMS2, POLD1, POLE, PTEN, RAD51C, RAD51D, RECQL, RET, SDHA, SDHAF2, SDHB, SDHC, SDHD, SMAD4, SMARCA4, STK11, TP53, TSC1, TSC2, and VHL.  RNA data is routinely analyzed for use in variant interpretation for all genes.   02/19/2021 Imaging   CT A/P w/o contrast  IMPRESSION: Slightly limited examination examination in absence of contrast administration. Status post partial right hepatectomy. Interval decrease in size in perihepatic fluid collection and resolution of right subdiaphragmatic fluid and gas. No new intra-abdominal fluid collections are identified.   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 Moderate stool throughout the colon without evidence of obstruction.   Fluid distension of the proximal duodenum to the level  of the SMA hiatus which appears narrow. 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   05/05/2021 Imaging   CT AP  IMPRESSION: Postsurgical changes as described stable in appearance from the prior exam.   Changes suggestive of mild pelvic varices.   08/22/2021 Imaging   EXAM: CT ABDOMEN AND PELVIS WITH CONTRAST  IMPRESSION: 1. Extensive bowel content is identified throughout the colon consistent with constipation. 2. 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 3. Stable postoperative changes in the right lobe liver.   09/12/2021 Survivorship   SCP delivered by Cira Rue, NP   10/05/2021 Imaging   EXAM: CT ABDOMEN AND PELVIS WITH CONTRAST  IMPRESSION: 1. Right ovarian/adnexal cyst, 5.2 cm. Because this lesion is not adequately characterized, prompt [place] is recommended for further evaluation. Note: This recommendation does not apply to premenarchal patients and to those with increased risk (genetic, family history, elevated tumor markers or other high-risk factors) of ovarian cancer. Reference: JACR 2020 Feb; 17(2):248-254 2. No other evidence of an acute abnormality within the abdomen or pelvis. 3. Moderate increase in the colonic and rectal stool burden. No bowel obstruction or inflammation. 4. No evidence of locally recurrent or metastatic colon carcinoma.   11/12/2021 Imaging   EXAM: CT CHEST WITHOUT CONTRAST  IMPRESSION: 1. No evidence of pulmonary metastatic disease. 2. Stable postoperative findings about the posterior right lobe of the liver.   03/28/2022 - 04/18/2022 Chemotherapy   Patient is on Treatment Plan : COLORECTAL Xelox (Capeox) q21d     05/23/2022 - 05/23/2022 Chemotherapy   Patient is on Treatment Plan : COLORECTAL CAPEOX (130/850) q21d x 8 cycles        INTERVAL HISTORY:  Stephanie [name] is here for a follow up of metastatic colon cancer. She was last seen by me on 06/13/22. She presents to the  clinic alone. She reports she continues to do well overall. She notes she experienced some blistering to the bottom of her feet with last cycle, denies issues with her hands.   All other systems were reviewed with the patient and are negative.  MEDICAL HISTORY:  Past Medical History:  Diagnosis Date   Anxiety    Blood transfusion without reported diagnosis    Bowel obstruction (HCC)    BV (bacterial vaginosis)    Colon cancer ([place])    Colon Cancer 03-2020   Colon polyps    Colostomy present ([place])    03-2020   Family history of bladder cancer    History of chemotherapy    ended 09-2020   Lactose intolerance 03/12/2020   Neuromuscular disorder (HCC)    neuropathy feet hands legs   UTI (lower urinary tract infection)     SURGICAL HISTORY: Past Surgical History:  Procedure Laterality Date   CESAREAN SECTION     CHOLECYSTECTOMY  12/13/2020   COLECTOMY  03/2020   COLONOSCOPY     COLOSTOMY TAKEDOWN N/A 05/30/2021   Procedure: LAPAROSCOPIC ASSISTED COLOSTOMY TAKEDOWN;  Surgeon: [name], MD;  Location: [place];  Service: General;  Laterality: N/A;   CYSTOSCOPY WITH STENT PLACEMENT  04/04/2020   Procedure: CYSTOSCOPY WITH STENT PLACEMENT;  Surgeon: [name], MD;  Location: [facility];  Service: General;;   LAPAROSCOPIC LIVER ULTRASOUND N/A 12/13/2020   Procedure: INTRAOPERATIVE LIVER ULTRASOUND;  Surgeon: [name], MD;  Location: [place];  Service: General;  Laterality: N/A;   LAPAROSCOPY N/A  12/13/2020   Procedure: LAPAROSCOPY DIAGNOSTIC;  Surgeon: [name], MD;  Location: [place];  Service: General;  Laterality: N/A;   LAPAROTOMY N/A 04/04/2020   Procedure: EXPLORATORY LAPAROTOMY;  Surgeon: [name], MD;  Location: [facility];  Service: General;  Laterality: N/A;   OPEN PARTIAL HEPATECTOMY  N/A 12/13/2020   Procedure: OPEN PARTIAL HEPATECTOMY;  Surgeon: [name], MD;  Location: [place];  Service: General;  Laterality: N/A;  ROOM 2 STARTING AT 09:30AM FOR 300 MIN    PORTACATH PLACEMENT Right 04/12/2020   Procedure: INSERTION PORT-A-CATH WITH ULTRASOUND;  Surgeon: Kieth Brightly Arta [place], MD;  Location: [facility];  Service: General;  Laterality: Right;    I have reviewed the social history and family history with the patient and they are unchanged from previous note.  ALLERGIES:  is allergic to oxaliplatin.  MEDICATIONS:  Current Outpatient Medications  Medication Sig Dispense Refill   acetaminophen (TYLENOL) 325 MG tablet Take 2 tablets (650 mg total) by mouth every 6 (six) hours as needed for mild pain, moderate pain, fever or headache (fever > 101).     AUROVELA 1.5/30 1.5-30 MG-MCG tablet TAKE ONE TABLET BY MOUTH DAILY 21 tablet 6   capecitabine (XELODA) 500 MG tablet Take 3 tablets in morning and 2 tablets in evening, every 12 hours.  Take Take for 14 days on, then off 7 days. Repeat every 21 days. 70 tablet 1   HYDROcodone-acetaminophen (NORCO/VICODIN) 5-325 MG tablet Take 1 tablet by mouth every 6 (six) hours as needed for severe pain. 10 tablet 0   ibuprofen (ADVIL) 600 MG tablet Take 1 tablet (600 mg total) by mouth every 6 (six) hours as needed for headache, mild pain, moderate pain or cramping. 30 tablet 2   Lactic Ac-Citric Ac-Pot Bitart (PHEXXI) 1.8-1-0.4 % GEL Place 5 g vaginally as needed. 60 g 1   meloxicam (MOBIC) 7.5 MG tablet Take 1 tablet (7.5 mg total) by mouth daily. 20 tablet 0   metroNIDAZOLE (FLAGYL) 500 MG tablet Take 1 tablet (500 mg total) by mouth 3 (three) times daily. 21 tablet 0   ondansetron (ZOFRAN) 8 MG tablet TAKE 1 TABLET(8 MG) BY MOUTH EVERY 8 HOURS AS NEEDED FOR NAUSEA OR VOMITING 30 tablet 2   Prenatal Vit-Fe Fumarate-FA (PRENATAL MULTIVITAMIN) TABS tablet Take 1 tablet by mouth daily at 12 noon.     prochlorperazine (COMPAZINE) 10 MG tablet Take 1 tablet (10 mg total) by mouth every 6 (six) hours as needed (Nausea or vomiting). 30 tablet 1   sulfamethoxazole-trimethoprim (BACTRIM DS) 800-160 MG tablet Take 1 tablet by  mouth 2 (two) times daily.     SUTAB [phone] MG TABS Take by mouth.     topiramate (TOPAMAX) 50 MG tablet Take 50 mg by mouth daily.     traMADol (ULTRAM) 50 MG tablet Take 1 tablet (50 mg total) by mouth every 6 (six) hours as needed. 30 tablet 0   urea (CARMOL) 10 % cream Apply topically 2 (two) times daily. 71 g 0   No current facility-administered medications for this visit.    PHYSICAL EXAMINATION: ECOG PERFORMANCE STATUS: 1 - Symptomatic but completely ambulatory  Vitals:   07/04/22 0923  BP: 122/81  Pulse: 68  Resp: 16  Temp: 98 F (36.7 C)  SpO2: 98%   Wt Readings from Last 3 Encounters:  07/04/22 159 lb 5 oz (72.3 kg)  06/13/22 157 lb 11.2 oz (71.5 kg)  06/02/22 150 lb (68 kg)     GENERAL:alert, no distress  and comfortable SKIN: skin color normal, no rashes or significant lesions EYES: normal, Conjunctiva are pink and non-injected, sclera clear  NEURO: alert & oriented x 3 with fluent speech  LABORATORY DATA:  I have reviewed the data as listed    Latest Ref Rng & Units 07/04/2022    8:57 AM 06/13/2022    8:57 AM 06/02/2022    5:47 PM  CBC  WBC 4.0 - 10.5 K/uL 5.7  4.1  4.3   Hemoglobin 12.0 - 15.0 g/dL 14.0  14.2  13.2   Hematocrit 36.0 - 46.0 % 40.1  41.7  39.2   Platelets 150 - 400 K/uL 112  125  113         Latest Ref Rng & Units 07/04/2022    8:57 AM 06/13/2022    8:57 AM 06/02/2022    5:47 PM  CMP  Glucose 70 - 99 mg/dL 137  102  131   BUN 6 - 20 mg/dL _0 Creatinine 0.44 - 1.00 mg/dL 0.72  0.59  0.77   Sodium 135 - 145 mmol/L 136  139  141   Potassium 3.5 - 5.1 mmol/L 3.7  4.2  3.8   Chloride 98 - 111 mmol/L 104  106  111   CO2 22 - 32 mmol/L _1 Calcium 8.9 - 10.3 mg/dL 8.5  9.3  8.9   Total Protein 6.5 - 8.1 g/dL 6.5  6.8  6.3   Total Bilirubin 0.3 - 1.2 mg/dL 0.5  0.5  0.4   Alkaline Phos 38 - 126 U/L 49  82  54   AST 15 - 41 U/L 19  34  19   ALT 0 - 44 U/L _2 RADIOGRAPHIC STUDIES: I have personally  reviewed the radiological images as listed and agreed with the findings in the report. No results found.    Orders Placed This Encounter  Procedures   CT CHEST ABDOMEN PELVIS W CONTRAST    Standing Status:   Future    Standing Expiration Date:   07/05/2023    Order Specific Question:   Is patient pregnant?    Answer:   No    Order Specific Question:   Preferred imaging location?    Answer:   [facility]    Order Specific Question:   Is Oral Contrast requested for this exam?    Answer:   Yes, Per Radiology protocol   All questions were answered. The patient knows to call the clinic with any problems, questions or concerns. No barriers to learning was detected. The total time spent in the appointment was 30 minutes.     [name], MD 07/04/2022   I, Wilburn Mylar, am acting as scribe for [name], MD.   I have reviewed the above documentation for accuracy and completeness, and I agree with the above.

## 2022-07-07 ENCOUNTER — Other Ambulatory Visit (HOSPITAL_COMMUNITY): Payer: Self-pay

## 2022-07-09 ENCOUNTER — Other Ambulatory Visit: Payer: Self-pay | Admitting: Hematology

## 2022-07-09 ENCOUNTER — Other Ambulatory Visit (HOSPITAL_COMMUNITY): Payer: Self-pay

## 2022-07-09 MED ORDER — METRONIDAZOLE 500 MG PO TABS: 500.0000 mg | ORAL_TABLET | Freq: Two times a day (BID) | ORAL | 0 refills | Status: DC

## 2022-07-18 ENCOUNTER — Other Ambulatory Visit: Payer: Self-pay

## 2022-07-18 ENCOUNTER — Emergency Department (HOSPITAL_BASED_OUTPATIENT_CLINIC_OR_DEPARTMENT_OTHER): Payer: BC Managed Care – PPO

## 2022-07-18 ENCOUNTER — Encounter (HOSPITAL_BASED_OUTPATIENT_CLINIC_OR_DEPARTMENT_OTHER): Payer: Self-pay | Admitting: Emergency Medicine

## 2022-07-18 ENCOUNTER — Emergency Department (HOSPITAL_BASED_OUTPATIENT_CLINIC_OR_DEPARTMENT_OTHER): Payer: BC Managed Care – PPO | Admitting: Radiology

## 2022-07-18 ENCOUNTER — Emergency Department (HOSPITAL_BASED_OUTPATIENT_CLINIC_OR_DEPARTMENT_OTHER)
Admission: EM | Admit: 2022-07-18 | Discharge: 2022-07-18 | Disposition: A | Discharge status: Home / Self Care | Payer: BC Managed Care – PPO | Attending: Emergency Medicine | Admitting: Emergency Medicine

## 2022-07-18 DIAGNOSIS — R112 Nausea with vomiting, unspecified: Secondary | ICD-10-CM | POA: Diagnosis not present

## 2022-07-18 DIAGNOSIS — R0602 Shortness of breath: Secondary | ICD-10-CM | POA: Diagnosis not present

## 2022-07-18 DIAGNOSIS — F419 Anxiety disorder, unspecified: Secondary | ICD-10-CM | POA: Diagnosis not present

## 2022-07-18 DIAGNOSIS — R079 Chest pain, unspecified: Secondary | ICD-10-CM

## 2022-07-18 DIAGNOSIS — R072 Precordial pain: Secondary | ICD-10-CM | POA: Diagnosis present

## 2022-07-18 DIAGNOSIS — C19 Malignant neoplasm of rectosigmoid junction: Secondary | ICD-10-CM | POA: Diagnosis not present

## 2022-07-18 HISTORY — DX: Chest pain, unspecified: R07.9

## 2022-07-18 LAB — BASIC METABOLIC PANEL
Anion gap: 8 (ref 5–15)
BUN: 7 mg/dL (ref 6–20)
CO2: 26 mmol/L (ref 22–32)
Calcium: 9.5 mg/dL (ref 8.9–10.3)
Chloride: 105 mmol/L (ref 98–111)
Creatinine, Ser: 0.65 mg/dL (ref 0.44–1.00)
GFR, Estimated: >60 mL/min (ref >60)
Glucose, Bld: 82 mg/dL (ref 70–99)
Potassium: 3.9 mmol/L (ref 3.5–5.1)
Sodium: 139 mmol/L (ref 135–145)

## 2022-07-18 LAB — CBC
HCT: 42.2 % (ref 36.0–46.0)
Hemoglobin: 14.6 g/dL (ref 12.0–15.0)
MCH: 34.5 pg — ABNORMAL HIGH (ref 26.0–34.0)
MCHC: 34.6 g/dL (ref 30.0–36.0)
MCV: 99.8 fL (ref 80.0–100.0)
Platelets: 138 10*3/uL — ABNORMAL LOW (ref 150–400)
RBC: 4.23 MIL/uL (ref 3.87–5.11)
RDW: 14.3 % (ref 11.5–15.5)
WBC: 5.4 10*3/uL (ref 4.0–10.5)
nRBC: 0 % (ref 0.0–0.2)

## 2022-07-18 LAB — PREGNANCY, URINE: Preg Test, Ur: NEGATIVE

## 2022-07-18 LAB — TROPONIN I (HIGH SENSITIVITY)
Troponin I (High Sensitivity): <2 ng/L (ref <18)
Troponin I (High Sensitivity): <2 ng/L (ref <18)

## 2022-07-18 MED ORDER — IOHEXOL 350 MG/ML SOLN
100.0000 mL | Freq: Once | INTRAVENOUS | Status: AC | PRN
  Administered 2022-07-18: 80 mL via INTRAVENOUS

## 2022-07-18 MED ORDER — LORAZEPAM 2 MG/ML IJ SOLN
1.0000 mg | Freq: Once | INTRAMUSCULAR | Status: AC
  Administered 2022-07-18: 1 mg via INTRAVENOUS
  Filled 2022-07-18: qty 1

## 2022-07-18 MED ORDER — MORPHINE SULFATE (PF) 4 MG/ML IV SOLN
4.0000 mg | Freq: Once | INTRAVENOUS | Status: AC
  Administered 2022-07-18: 4 mg via INTRAVENOUS
  Filled 2022-07-18: qty 1

## 2022-07-18 MED ORDER — ONDANSETRON HCL 4 MG/2ML IJ SOLN
4.0000 mg | Freq: Once | INTRAMUSCULAR | Status: AC
  Administered 2022-07-18: 4 mg via INTRAVENOUS
  Filled 2022-07-18: qty 2

## 2022-07-18 NOTE — ED Provider Notes (Signed)
[facility] Provider Note   CSN: [id number] Arrival date & time: 07/18/22  1221     History  Chief Complaint  Patient presents with   Chest Pain    Stephanie [name] is a 41 y.o. female.  Patient presents to the emergency department complaining of chest pain and shortness of breath which began last night at approximately 8 PM.  Patient states that the pain is sharp in nature, is felt near the port on the right side of her chest, and is also felt on the left side of her chest just lateral to the sternum.  Patient rates the pain an 8 out of 10 in severity.  She also endorses shortness of breath began at the same time.  She endorses pain with inspiration.  She denies nausea, vomiting, abdominal pain at this time.  Patient has a history of colon cancer and is currently getting chemotherapy for the same.  Her last treatment was a few days ago.  She states she has had increased nausea and vomiting but this is not coinciding with the chest pain.  Patient also endorses multiple stressors in her life at this time and increased anxiety.  Past medical history includes cholecystectomy, colectomy, partial hepatectomy, colon cancer, microcytic anemia  HPI     Home Medications Prior to Admission medications   Medication Sig Start Date End Date Taking? Authorizing Provider  acetaminophen (TYLENOL) 325 MG tablet Take 2 tablets (650 mg total) by mouth every 6 (six) hours as needed for mild pain, moderate pain, fever or headache (fever > 101). 05/31/21   [name], MD  AUROVELA 1.5/30 1.5-30 MG-MCG tablet TAKE ONE TABLET BY MOUTH DAILY 02/10/22   Griffin Basil, MD  capecitabine (XELODA) 500 MG tablet Take 3 tablets in morning and 2 tablets in evening, every 12 hours.  Take Take for 14 days on, then off 7 days. Repeat every 21 days. 07/01/22   [name], MD  HYDROcodone-acetaminophen (NORCO/VICODIN) 5-325 MG tablet Take 1 tablet by mouth every 6 (six) hours as needed for  severe pain. 06/04/22   [name], MD  ibuprofen (ADVIL) 600 MG tablet Take 1 tablet (600 mg total) by mouth every 6 (six) hours as needed for headache, mild pain, moderate pain or cramping. 10/08/21   Griffin Basil, MD  Lactic Ac-Citric Ac-Pot Bitart (PHEXXI) 1.8-1-0.4 % GEL Place 5 g vaginally as needed. 08/05/21   Leftwich-Kirby, Kathie Dike, CNM  meloxicam (MOBIC) 7.5 MG tablet Take 1 tablet (7.5 mg total) by mouth daily. 04/28/21   Montine Circle, PA-C  metroNIDAZOLE (FLAGYL) 500 MG tablet Take 1 tablet (500 mg total) by mouth 2 (two) times daily. 07/09/22   [name], MD  ondansetron (ZOFRAN) 8 MG tablet TAKE 1 TABLET(8 MG) BY MOUTH EVERY 8 HOURS AS NEEDED FOR NAUSEA OR VOMITING 05/23/22   [name], MD  Prenatal Vit-Fe Fumarate-FA (PRENATAL MULTIVITAMIN) TABS tablet Take 1 tablet by mouth daily at 12 noon.    [provider]  prochlorperazine (COMPAZINE) 10 MG tablet Take 1 tablet (10 mg total) by mouth every 6 (six) hours as needed (Nausea or vomiting). 05/23/22   [name], MD  sulfamethoxazole-trimethoprim (BACTRIM DS) 800-160 MG tablet Take 1 tablet by mouth 2 (two) times daily. 12/12/21   [provider]  SUTAB [id number] MG TABS Take by mouth. 11/27/21   [provider]  topiramate (TOPAMAX) 50 MG tablet Take 50 mg by mouth daily. 01/27/22   [provider]  traMADol (  ULTRAM) 50 MG tablet Take 1 tablet (50 mg total) by mouth every 6 (six) hours as needed. 04/18/22   [name], MD  urea (CARMOL) 10 % cream Apply topically 2 (two) times daily. 05/01/22   [name], MD      Allergies    Oxaliplatin    Review of Systems   Review of Systems  Respiratory:  Positive for shortness of breath.   Cardiovascular:  Positive for chest pain.  Gastrointestinal:  Positive for blood in stool (1 episode a few days ago, thought to be due to possibly taking a wrong/tainted medication), nausea and vomiting. Negative for abdominal pain.       Nausea and vomiting earlier in the  week, currently resolved    Physical Exam Updated Vital Signs BP 98/70   Pulse (!) 51   Temp 98.2 F (36.8 C) (Oral)   Resp 14   Ht '5\' 8"'$  (1.727 m)   Wt 68 kg   SpO2 100%   BMI 22.81 kg/m  Physical Exam Vitals and nursing note reviewed.  Constitutional:      General: She is not in acute distress.    Appearance: She is well-developed.  HENT:     Head: Normocephalic and atraumatic.  Eyes:     Conjunctiva/sclera: Conjunctivae normal.  Cardiovascular:     Rate and Rhythm: Normal rate and regular rhythm.     Heart sounds: No murmur heard.    Comments: Port in upper right chest Pulmonary:     Effort: Pulmonary effort is normal. No respiratory distress.     Breath sounds: Normal breath sounds.  Chest:     Chest wall: No tenderness.  Abdominal:     Palpations: Abdomen is soft.     Tenderness: There is no abdominal tenderness.  Musculoskeletal:        General: No swelling.     Cervical back: Neck supple.  Skin:    General: Skin is warm and dry.     Capillary Refill: Capillary refill takes less than 2 seconds.  Neurological:     Mental Status: She is alert.  Psychiatric:        Mood and Affect: Mood normal.     ED Results / Procedures / Treatments   Labs (all labs ordered are listed, but only abnormal results are displayed) Labs Reviewed  CBC - Abnormal; Notable for the following components:      Result Value   MCH 34.5 (*)    Platelets 138 (*)    All other components within normal limits  BASIC METABOLIC PANEL  PREGNANCY, URINE  TROPONIN I (HIGH SENSITIVITY)  TROPONIN I (HIGH SENSITIVITY)    EKG None  Radiology CT Angio Chest PE W/Cm &/Or Wo Cm  Result Date: 07/18/2022 CLINICAL DATA:  Chest pain EXAM: CT ANGIOGRAPHY CHEST WITH CONTRAST TECHNIQUE: Multidetector CT imaging of the chest was performed using the standard protocol during bolus administration of intravenous contrast. Multiplanar CT image reconstructions and MIPs were obtained to evaluate the  vascular anatomy. RADIATION DOSE REDUCTION: This exam was performed according to the departmental dose-optimization program which includes automated exposure control, adjustment of the mA and/or kV according to patient size and/or use of iterative reconstruction technique. CONTRAST:  50m OMNIPAQUE IOHEXOL 350 MG/ML SOLN COMPARISON:  CT done on 05/13/2022, chest radiograph done today FINDINGS: Cardiovascular: There are no intraluminal filling defects in pulmonary artery branches. Main pulmonary artery is slightly prominent measuring 3.1 cm. Contrast density in thoracic aorta is less than adequate to  evaluate the lumen. Mediastinum/Nodes: No significant lymphadenopathy is seen. Lungs/Pleura: There is no focal pulmonary consolidation. There is no pleural effusion or pneumothorax. Upper Abdomen: Surgical clips are noted posterior to the right lobe of liver. Musculoskeletal: Unremarkable. Review of the MIP images confirms the above findings. IMPRESSION: There is no evidence of pulmonary artery embolism. There is no focal pulmonary consolidation. Other findings as described in the body of the report. Electronically Signed   By: Elmer Picker M.D.   On: 07/18/2022 15:05   DG Chest 2 View  Result Date: 07/18/2022 CLINICAL DATA:  Chest pain EXAM: CHEST - 2 VIEW COMPARISON:  None Available. FINDINGS: Port in the anterior chest wall with tip in distal SVC. Normal mediastinum and cardiac silhouette. Normal pulmonary vasculature. No evidence of effusion, infiltrate, or pneumothorax. No acute bony abnormality. IMPRESSION: No active cardiopulmonary disease. Electronically Signed   By: Suzy Bouchard M.D.   On: 07/18/2022 13:16    Procedures Procedures    Medications Ordered in ED Medications  LORazepam (ATIVAN) injection 1 mg (1 mg Intravenous Given 07/18/22 1341)  morphine (PF) 4 MG/ML injection 4 mg (4 mg Intravenous Given 07/18/22 1341)  ondansetron (ZOFRAN) injection 4 mg (4 mg Intravenous Given  07/18/22 1341)  iohexol (OMNIPAQUE) 350 MG/ML injection 100 mL (80 mLs Intravenous Contrast Given 07/18/22 1438)    ED Course/ Medical Decision Making/ A&P                           Medical Decision Making Amount and/or Complexity of Data Reviewed Labs: ordered. Radiology: ordered.  Risk Prescription drug management.   This patient presents to the ED for concern of chest pain with shortness of breath, this involves an extensive number of treatment options, and is a complaint that carries with it a high risk of complications and morbidity.  The differential diagnosis includes pulmonary embolism, pneumonia, ACS, dissection, musculoskeletal pain, anxiety, and others   Co morbidities that complicate the patient evaluation  History of colon cancer, current chemotherapy   Additional history obtained:   External records from outside source obtained and reviewed including oncology office notes documenting custody issues leading to increased anxiety and depression   Lab Tests:  I Ordered, and personally interpreted labs.  The pertinent results include: Unremarkable CBC, BMP, troponin.  Negative pregnancy test.   Imaging Studies ordered:  I ordered imaging studies including CT PE study, chest x-ray I independently visualized and interpreted imaging which showed no acute findings I agree with the radiologist interpretation   Cardiac Monitoring: / EKG:  The patient was maintained on a cardiac monitor.  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  No signs of ACS with nonischemic EKG and negative troponins.  PE study was negative for PE.  No clinical signs of dissection.  No pneumonia on chest x-ray.  Lung sounds are clear bilaterally.  Initially considered this may be due to patient's increasing anxiety.  Upon reassessment had patient  move her arm again to see if she could reproduce pain in any way.  Upon moving her arm above her head she was able to reproduce the pain somewhat.  At this time I believe the pain may be musculoskeletal in nature.  She does endorse going back to work approximately 2 weeks ago after not being at work for a very long period of time.  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  No life-threatening findings.  Vitals are normal.  No indication for admission.  Feel that the patient may discharge home at this time.  I will recommend that she follow-up with her primary team to discuss today's events and to see if they have any further recommendations        Final Clinical Impression(s) / ED Diagnoses Final diagnoses:  Chest pain, unspecified type    Rx / DC Orders ED Discharge Orders     None         Ronny Bacon 07/18/22 [place], Ankit, MD 07/20/22 1341

## 2022-07-18 NOTE — ED Triage Notes (Signed)
Pt c/o chest pain since last night. Currently takes chemo pill for colorectal CA.

## 2022-07-18 NOTE — ED Notes (Signed)
Patient verbalizes understanding of discharge instructions. Opportunity for questioning and answers were provided. Patient discharged from ED.  °

## 2022-07-18 NOTE — Discharge Instructions (Addendum)
You were seen today due to chest pain.  Your work-up was reassuring for no signs of pulmonary embolism, heart attack, pneumonia, or other life-threatening findings.  This may be musculoskeletal in nature due to your nature of work.  I do recommend that you follow-up with your primary team to discuss today's events to see if they have any further recommendations.  If your chest pain suddenly worsens, you have worsening shortness of breath, or other life-threatening symptoms develop, please return to the emergency department

## 2022-07-21 ENCOUNTER — Other Ambulatory Visit (HOSPITAL_COMMUNITY): Payer: Self-pay

## 2022-07-23 ENCOUNTER — Other Ambulatory Visit (HOSPITAL_COMMUNITY): Payer: Self-pay

## 2022-07-23 ENCOUNTER — Other Ambulatory Visit: Payer: Self-pay

## 2022-07-23 ENCOUNTER — Ambulatory Visit (HOSPITAL_COMMUNITY): Payer: BC Managed Care – PPO

## 2022-07-24 ENCOUNTER — Other Ambulatory Visit: Payer: Self-pay

## 2022-07-25 ENCOUNTER — Inpatient Hospital Stay: Payer: BC Managed Care – PPO

## 2022-07-25 ENCOUNTER — Other Ambulatory Visit (HOSPITAL_COMMUNITY): Payer: Self-pay

## 2022-07-25 ENCOUNTER — Inpatient Hospital Stay: Payer: BC Managed Care – PPO | Admitting: Hematology

## 2022-07-29 ENCOUNTER — Other Ambulatory Visit (HOSPITAL_COMMUNITY): Payer: Self-pay

## 2022-07-30 ENCOUNTER — Ambulatory Visit (HOSPITAL_COMMUNITY)
Admission: RE | Admit: 2022-07-30 | Discharge: 2022-07-30 | Disposition: A | Discharge status: Home / Self Care | Payer: BC Managed Care – PPO | Source: Ambulatory Visit | Attending: Hematology | Admitting: Hematology

## 2022-07-30 ENCOUNTER — Inpatient Hospital Stay: Payer: BC Managed Care – PPO | Attending: Nurse Practitioner

## 2022-07-30 ENCOUNTER — Other Ambulatory Visit: Payer: Self-pay

## 2022-07-30 DIAGNOSIS — C7961 Secondary malignant neoplasm of right ovary: Secondary | ICD-10-CM | POA: Insufficient documentation

## 2022-07-30 DIAGNOSIS — C19 Malignant neoplasm of rectosigmoid junction: Secondary | ICD-10-CM | POA: Insufficient documentation

## 2022-07-30 DIAGNOSIS — G620 Drug-induced polyneuropathy: Secondary | ICD-10-CM | POA: Diagnosis not present

## 2022-07-30 DIAGNOSIS — C787 Secondary malignant neoplasm of liver and intrahepatic bile duct: Secondary | ICD-10-CM | POA: Insufficient documentation

## 2022-07-30 DIAGNOSIS — Z95828 Presence of other vascular implants and grafts: Secondary | ICD-10-CM

## 2022-07-30 DIAGNOSIS — Z8052 Family history of malignant neoplasm of bladder: Secondary | ICD-10-CM | POA: Diagnosis not present

## 2022-07-30 LAB — CMP (CANCER CENTER ONLY)
ALT: 19 U/L (ref 0–44)
AST: 21 U/L (ref 15–41)
Albumin: 4.2 g/dL (ref 3.5–5.0)
Alkaline Phosphatase: 47 U/L (ref 38–126)
Anion gap: 6 (ref 5–15)
BUN: 11 mg/dL (ref 6–20)
CO2: 25 mmol/L (ref 22–32)
Calcium: 9 mg/dL (ref 8.9–10.3)
Chloride: 109 mmol/L (ref 98–111)
Creatinine: 0.6 mg/dL (ref 0.44–1.00)
GFR, Estimated: >60 mL/min (ref >60)
Glucose, Bld: 90 mg/dL (ref 70–99)
Potassium: 3.6 mmol/L (ref 3.5–5.1)
Sodium: 140 mmol/L (ref 135–145)
Total Bilirubin: 0.8 mg/dL (ref 0.3–1.2)
Total Protein: 6.6 g/dL (ref 6.5–8.1)

## 2022-07-30 LAB — CBC WITH DIFFERENTIAL (CANCER CENTER ONLY)
Abs Immature Granulocytes: 0.01 10*3/uL (ref 0.00–0.07)
Basophils Absolute: 0 10*3/uL (ref 0.0–0.1)
Basophils Relative: 1 %
Eosinophils Absolute: 0.1 10*3/uL (ref 0.0–0.5)
Eosinophils Relative: 1 %
HCT: 39.5 % (ref 36.0–46.0)
Hemoglobin: 14 g/dL (ref 12.0–15.0)
Immature Granulocytes: 0 %
Lymphocytes Relative: 26 %
Lymphs Abs: 1.5 10*3/uL (ref 0.7–4.0)
MCH: 34.7 pg — ABNORMAL HIGH (ref 26.0–34.0)
MCHC: 35.4 g/dL (ref 30.0–36.0)
MCV: 97.8 fL (ref 80.0–100.0)
Monocytes Absolute: 0.4 10*3/uL (ref 0.1–1.0)
Monocytes Relative: 8 %
Neutro Abs: 3.5 10*3/uL (ref 1.7–7.7)
Neutrophils Relative %: 64 %
Platelet Count: 124 10*3/uL — ABNORMAL LOW (ref 150–400)
RBC: 4.04 MIL/uL (ref 3.87–5.11)
RDW: 14.4 % (ref 11.5–15.5)
WBC Count: 5.5 10*3/uL (ref 4.0–10.5)
nRBC: 0 % (ref 0.0–0.2)

## 2022-07-30 LAB — CEA (ACCESS): CEA (CHCC): 3.39 ng/mL (ref 0.00–5.00)

## 2022-07-30 MED ORDER — SODIUM CHLORIDE (PF) 0.9 % IJ SOLN
INTRAMUSCULAR | Status: AC
  Filled 2022-07-30: qty 50

## 2022-07-30 MED ORDER — HEPARIN SOD (PORK) LOCK FLUSH 100 UNIT/ML IV SOLN
INTRAVENOUS | Status: AC
  Administered 2022-07-30: 500 [IU]
  Filled 2022-07-30: qty 5

## 2022-07-30 MED ORDER — IOHEXOL 300 MG/ML  SOLN
100.0000 mL | Freq: Once | INTRAMUSCULAR | Status: AC | PRN
  Administered 2022-07-30: 100 mL via INTRAVENOUS

## 2022-07-30 MED ORDER — IOHEXOL 9 MG/ML PO SOLN
1000.0000 mL | Freq: Once | ORAL | Status: AC
  Administered 2022-07-30: 1000 mL via ORAL

## 2022-07-30 MED ORDER — SODIUM CHLORIDE 0.9% FLUSH
10.0000 mL | Freq: Once | INTRAVENOUS | Status: AC
  Administered 2022-07-30: 10 mL

## 2022-07-30 MED ORDER — IOHEXOL 9 MG/ML PO SOLN
ORAL | Status: AC
  Filled 2022-07-30: qty 1000

## 2022-07-31 ENCOUNTER — Telehealth: Payer: Self-pay

## 2022-07-31 NOTE — Telephone Encounter (Signed)
Patient called requesting results from CT scan. Instructed that results will be reviewed at appointment with Dr. Burr Medico on 08/01/22.

## 2022-08-01 ENCOUNTER — Inpatient Hospital Stay (HOSPITAL_BASED_OUTPATIENT_CLINIC_OR_DEPARTMENT_OTHER): Payer: BC Managed Care – PPO | Admitting: Hematology

## 2022-08-01 ENCOUNTER — Encounter: Payer: Self-pay | Admitting: Hematology

## 2022-08-01 VITALS — BP 104/80 | HR 95 | Temp 98.1°F | Resp 14 | Ht 68.0 in | Wt 154.8 lb

## 2022-08-01 DIAGNOSIS — C19 Malignant neoplasm of rectosigmoid junction: Secondary | ICD-10-CM

## 2022-08-01 MED ORDER — TRAMADOL HCL 50 MG PO TABS: 50.0000 mg | ORAL_TABLET | Freq: Four times a day (QID) | ORAL | 0 refills | Status: DC | PRN

## 2022-08-01 NOTE — Progress Notes (Signed)
[place]   [phone] [phone]   Clinic Follow up Note   Patient Care Team: [facility] [place] as PCP - General [name], MD as Consulting Physician (Oncology) [name], MD as Consulting Physician (General Surgery) [name], NP as Nurse Practitioner (Nurse Practitioner) Griffin Basil, MD as Consulting Physician (Obstetrics and Gynecology)  Date of Service:  08/01/2022  CHIEF COMPLAINT: f/u of metastatic colon cancer  CURRENT THERAPY:  -Xeloda, q21d, starting 03/28/22, currently 1520m am and 10081mpm day 1-14 from 05/19/22    ASSESSMENT & PLAN:  LiNICOLLETTE WILHELMIs a [age].o. female with   1. Adenocarcinoma of the rectosigmoid colon, grade 2, [id number] IV with oligo liver metastasis; MMR normal, KRAS (+), right ovarian metastasis in 01/2022  -diagnosed 03/2020 by urgent colectomy for abdominal abscess. Liver metastasis confirmed by biopsy 04/16/20. -Foundation One showed K-ras mutation, she is not a candidate for EGFR inhibitor -s/p FOLFOXIRI/bevacizumab  05/23/20 - 10/24/20. 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 -s/p liver resection on 12/13/20 under Dr. ByBarry Dienes -right ovary metastasis confirmed by oophorectomy 02/25/22. -CAPEOX started 03/28/22, oxali discontinued after 05/23/22 due to recurrent infusion reaction. -she continues on Xeloda with good tolerance. -restaging CT CAP 07/30/22 showed: 3.4 cm lesion in left ovary, likely a cyst; otherwise, no evidence of new or progressive disease. -I reviewed the images and results with her today. She tells me she has had a period the last two months. She questioned why she couldn't have both ovaries removed. I explained this will put her in menopause, and she states she would rather do that than worry about recurrence. I also explained that the lesion is likely a benign cyst, at which point removal would not be recommended. If this is found to be cancerous, I  would recommend changing treatment. I will reach out to our gyn onc team for their recommendations.  -I will order PET scan and repeat pelvic USKoreao be done soon for further evaluation.   2. Social Support, Anxiety and Depression -her recent home situation has resolved, but she is still dealing with custody issues/issues with her daughter's father.   3.  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 More so in her feet, but she does note some functional deficits in her fingers. -stable     Plan: -continue Xeloda at same dose, she will start next cycle on 11/6 -I refilled tramadol -PET scan and pelvic USKoreao be done soon -f/u a few days after scans   No problem-specific Assessment & Plan notes found for this encounter.   SUMMARY OF ONCOLOGIC HISTORY: Oncology History Overview Note  Cancer Staging Malignant ne[facility] of [facility]osigmoid junction ([facility]: Colon and Rectum, AJCC 8th Edition - Pathologic stage from 04/04/2020: pT4a, pN1b, cM1 - Signed by BuAlla FeelingNP on 05/07/2020    Malignant ne[place] of [place]igmoid junction ([facility] 11/10/2019 Imaging   MRI Abdomen  IMPRESSION: 1. Redemonstrated hypoenhancing lesion of the posterior liver dome, hepatic segment VII, reduced in size compared to prior examination, measuring 1.3 x 1.3 cm, previously 1.8 x 1.7 cm. Findings are consistent with treatment response of a biopsy proven metastasis. No other evidence of lymphadenopathy or metastatic disease within the abdomen or pelvis. 2. Unchanged mild splenomegaly, maximum coronal span 14.0 cm. 3. 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   03/12/2020 Initial Diagnosis   Malignant neoplasm of rectosigmoid junction (  [place])   03/12/2020 Imaging   CT AP with contrast IMPRESSION: 1. Overall findings are highly concerning for colorectal carcinoma involving the sigmoid colon with an associated  perforation and adjacent abscess and phlegmon formation as detailed above. Currently, no collection is amenable to percutaneous drainage given their small size and location. 2. New 2 cm mass in the right hepatic lobe concerning for metastatic disease to the liver until proven otherwise. 3. Enlarged regional lymph nodes as detailed above is concerning for nodal metastatic disease. 4. Large stool burden. 5. Prominent pelvic veins which can be seen in patients with pelvic congestion syndrome.   03/13/2020 Imaging   ABD US IMPRESSION: Approximately 2.1 x 2.4 x 2.0 cm lobular homogeneously echogenic lesion in the right hepatic dome corresponds with the abnormality seen on the prior CT scan. Sonographically, this appearance is highly suggestive of a benign hemangioma.   Recommend MRI of the abdomen with gadolinium contrast which may provide a noninvasive diagnosis of benign hemangioma.    03/13/2020 Imaging   MR ABD W/WO CONTRAST Hepatobiliary: Diffuse low signal intensity throughout the hepatic parenchyma on T2 weighted images, presumably a consequence of recent Feraheme injection. In segment 7 of the liver (axial image 8 of series 5) there is a 2.5 x 1.9 cm well-defined lesion which is slightly T2 hyperintense. This lesion appears hyperintense on pre gadolinium T1 weighted images (likely a consequence of Feraheme). Interpretation of enhancement within the lesion is compromised by presence of Feraheme. No other hepatic lesions are confidently identified on today's examination. No intra or extrahepatic biliary ductal dilatation. Gallbladder is normal in appearance.   03/31/2020 Imaging   CT AP W contrast IMPRESSION: 1. Previously noted sigmoid colon mass appears increased in size, and again appears to be associated with a focal contained perforation which crosses the midline and has fistulized into the left adnexal region where there is now what appears to be a large left tubo-ovarian  abscess, as detailed above. This is also associated with multiple enlarged lymph nodes in the pelvis measuring up to 1.2 cm in short axis and borderline enlarged retroperitoneal lymph nodes, concerning for metastatic disease. In addition, previously suspected metastatic lesion in segment 7 of the liver has enlarged. 2. Small volume of ascites. 3. Additional incidental findings, as above.   04/04/2020 Cancer Staging   Staging form: Colon and Rectum, AJCC 8th Edition - Pathologic stage from 04/04/2020: pT4a, pN1b, cM1 - Signed by [name], NP on 05/07/2020   04/04/2020 Procedure   Paracentesis, path showed no malignant cells (mixed acute and chronic inflammation present)   04/04/2020 Surgery   Open sigmoid colectomy and end colostomy by Dr. [name]   04/04/2020 Pathology Results   FINAL MICROSCOPIC DIAGNOSIS: A. COLON, RECTOSIGMOID, RESECTION: - Invasive moderately differentiated adenocarcinoma, 6 cm, involving rectosigmoid junction - Carcinoma invades into serosal surface with perforation and associated serositis - Radial resection margin is positive for carcinoma; proximal and distal margins are not involved - Lymphovascular invasion is present - Metastatic carcinoma to one of fifteen lymph nodes (1/15); one tumor deposit - See oncology table B. LYMPH NODES, MESENTERIC, RESECTION: - Metastatic adenocarcinoma to one of six lymph nodes (1/6) - One tumor deposit  Addendum to note 2 involved lymph nodes (of 21 examined nodes) pT4a,pN1b MMR-normal, preserved expression of MLH1, MSH2, MSH6, PMS2   04/12/2020 Procedure   PAC placement    04/13/2020 Imaging   CT chest without contrast IMPRESSION: Interval development of bilateral pleural effusions, left slightly greater than right,  with resultant bibasilar atelectasis including subtotal collapse of the left lower lobe. No evidence of intrathoracic metastatic disease, though evaluation of the collapsed parenchyma is limited. Hepatic  metastasis again demonstrated.     04/16/2020 Pathology Results   FINAL MICROSCOPIC DIAGNOSIS:  A. LIVER, RIGHT LOBE, BIOPSY:  - Adenocarcinoma.  COMMENT:  The morphology is compatible with the provided clinical history of colorectal carcinoma.    05/23/2020 - 10/24/2020 Chemotherapy   FOLFIRINOX q2weeks for 3-6 months starting 05/23/20. Bevacizumab-bvzr [name] added with C2. Oxaliplatin held with C11-12 due to reaction. (pt developed SOB, chest palpitation and abdominal discomfort shortly after oxaliplatin started). Completed on 10/24/20.   08/05/2020 Imaging   CT AP  IMPRESSION: 1. Postsurgical changes of distal colectomy with a left lower quadrant end ostomy. No evidence of obstruction or acute complication at this time. Excluded rectal pouch in the deep pelvis without acute complication or worrisome features. 2. Slight interval decrease in size of a hypoattenuating lesion posterior right lobe liver measuring 1.7 x 1.8 x 2 cm. This lesion has previously undergone ultrasound-guided biopsy with pathologic results demonstrating adenocarcinoma compatible with metastatic disease from patient's resected colorectal carcinoma. 3. Slight prominence of the parametrial vessels bilaterally, nonspecific though can be seen in the setting of pelvic congestion syndrome. 4. Mild splenomegaly.  No focal lesion.     11/12/2020 Imaging   CT Chest  IMPRESSION: 1. No evidence of metastatic disease in the chest. 2. 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   12/13/2020 Surgery   A. LIVER, RIGHT, PARTIAL HEPATECTOMY WITH GALLBLADDER:  - Metastatic colon carcinoma to the liver showing approximately 80%  necrosis  - Resection margin is 0.8 cm from carcinoma  - Uninvolved liver parenchyma with no specific histopathologic changes  - Gallbladder with no specific histopathologic changes    01/28/2021 Genetic Testing   Negative genetic testing:  No pathogenic  variants detected on the [facility] CustomNext-Cancer + RNAinsight panel. The report date is 01/28/2021.   The CustomNext-Cancer+RNAinsight panel offered by [facility] included sequencing and rearrangement analysis for the following 47 genes:  APC, ATM, AXIN2, BARD1, BMPR1A, BRCA1, BRCA2, BRIP1, CDH1, CDK4, CDKN2A, CHEK2, DICER1, EPCAM, GREM1, HOXB13, MEN1, MLH1, MSH2, MSH3, MSH6, MUTYH, NBN, NF1, NF2, NTHL1, PALB2, PMS2, POLD1, POLE, PTEN, RAD51C, RAD51D, RECQL, RET, SDHA, SDHAF2, SDHB, SDHC, SDHD, SMAD4, SMARCA4, STK11, TP53, TSC1, TSC2, and VHL.  RNA data is routinely analyzed for use in variant interpretation for all genes.   02/19/2021 Imaging   CT A/P w/o contrast  IMPRESSION: Slightly limited examination examination in absence of contrast administration. Status post partial right hepatectomy. Interval decrease in size in perihepatic fluid collection and resolution of right subdiaphragmatic fluid and gas. No new intra-abdominal fluid collections are identified.   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 Moderate stool throughout the colon without evidence of obstruction.   Fluid distension of the proximal duodenum to the level of the SMA hiatus which appears narrow. 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   05/05/2021 Imaging   CT AP  IMPRESSION: Postsurgical changes as described stable in appearance from the prior exam.   Changes suggestive of mild pelvic varices.   08/22/2021 Imaging   EXAM: CT ABDOMEN AND PELVIS WITH CONTRAST  IMPRESSION: 1. Extensive bowel content is identified throughout the colon consistent with constipation. 2. Findings of descending colostomy with anastomosis at the rectosigmoid junction  is unchanged. 3. Stable postoperative changes in the right lobe liver.   09/12/2021 Survivorship   SCP delivered by  Cira Rue, NP   10/05/2021 Imaging   EXAM: CT ABDOMEN AND PELVIS WITH CONTRAST  IMPRESSION: 1. Right ovarian/adnexal cyst, 5.2 cm. Because this lesion is not adequately characterized, prompt [place] is recommended for further evaluation. Note: This recommendation does not apply to premenarchal patients and to those with increased risk (genetic, family history, elevated tumor markers or other high-risk factors) of ovarian cancer. Reference: JACR 2020 Feb; 17(2):248-254 2. No other evidence of an acute abnormality within the abdomen or pelvis. 3. Moderate increase in the colonic and rectal stool burden. No bowel obstruction or inflammation. 4. No evidence of locally recurrent or metastatic colon carcinoma.   11/12/2021 Imaging   EXAM: CT CHEST WITHOUT CONTRAST  IMPRESSION: 1. No evidence of pulmonary metastatic disease. 2. Stable postoperative findings about the posterior right lobe of the liver.   03/28/2022 - 04/18/2022 Chemotherapy   Patient is on Treatment Plan : COLORECTAL Xelox (Capeox) q21d     05/23/2022 - 05/23/2022 Chemotherapy   Patient is on Treatment Plan : COLORECTAL CAPEOX (130/850) q21d x 8 cycles        INTERVAL HISTORY:  Stephanie [name] is here for a follow up of metastatic colon cancer. She was last seen by me on 07/04/22. She presents to the clinic alone. She reports right-sided cramping, similar to how she felt prior to left ovary removal.   All other systems were reviewed with the patient and are negative.  MEDICAL HISTORY:  Past Medical History:  Diagnosis Date   Anxiety    Blood transfusion without reported diagnosis    Bowel obstruction (HCC)    BV (bacterial vaginosis)    Colon cancer ([place])    Colon Cancer 03-2020   Colon polyps    Colostomy present ([place])    03-2020   Family history of bladder cancer    History of chemotherapy    ended 09-2020   Lactose intolerance 03/12/2020   Neuromuscular disorder (HCC)    neuropathy feet hands legs   UTI  (lower urinary tract infection)     SURGICAL HISTORY: Past Surgical History:  Procedure Laterality Date   CESAREAN SECTION     CHOLECYSTECTOMY  12/13/2020   COLECTOMY  03/2020   COLONOSCOPY     COLOSTOMY TAKEDOWN N/A 05/30/2021   Procedure: LAPAROSCOPIC ASSISTED COLOSTOMY TAKEDOWN;  Surgeon: [name], MD;  Location: [place] [facility];  Service: General;  Laterality: N/A;   CYSTOSCOPY WITH STENT PLACEMENT  04/04/2020   Procedure: CYSTOSCOPY WITH STENT PLACEMENT;  Surgeon: [name], MD;  Location: [facility];  Service: General;;   LAPAROSCOPIC LIVER ULTRASOUND N/A 12/13/2020   Procedure: INTRAOPERATIVE LIVER ULTRASOUND;  Surgeon: [name], MD;  Location: [place];  Service: General;  Laterality: N/A;   LAPAROSCOPY N/A 12/13/2020   Procedure: LAPAROSCOPY DIAGNOSTIC;  Surgeon: [name], MD;  Location: [place];  Service: General;  Laterality: N/A;   LAPAROTOMY N/A 04/04/2020   Procedure: EXPLORATORY LAPAROTOMY;  Surgeon: [name], MD;  Location: [facility];  Service: General;  Laterality: N/A;   OPEN PARTIAL HEPATECTOMY  N/A 12/13/2020   Procedure: OPEN PARTIAL HEPATECTOMY;  Surgeon: [name], MD;  Location: [place];  Service: General;  Laterality: N/A;  ROOM 2 STARTING AT 09:30AM FOR 300 MIN   PORTACATH PLACEMENT Right 04/12/2020   Procedure: INSERTION PORT-A-CATH WITH ULTRASOUND;  Surgeon: Kieth Brightly Arta Bruce, MD;  Location: [facility];  Service: General;  Laterality: Right;    I have reviewed the social history and family history with the patient and they are unchanged from previous note.  ALLERGIES:  is allergic to oxaliplatin.  MEDICATIONS:  Current Outpatient Medications  Medication Sig Dispense Refill   acetaminophen (TYLENOL) 325 MG tablet Take 2 tablets (650 mg total) by mouth every 6 (six) hours as needed for mild pain, moderate pain, fever or headache (fever > 101).     AUROVELA 1.5/30 1.5-30 MG-MCG tablet TAKE ONE TABLET BY MOUTH DAILY 21 tablet 6   capecitabine (XELODA) 500  MG tablet Take 3 tablets in morning and 2 tablets in evening, every 12 hours.  Take Take for 14 days on, then off 7 days. Repeat every 21 days. 70 tablet 1   HYDROcodone-acetaminophen (NORCO/VICODIN) 5-325 MG tablet Take 1 tablet by mouth every 6 (six) hours as needed for severe pain. 10 tablet 0   ibuprofen (ADVIL) 600 MG tablet Take 1 tablet (600 mg total) by mouth every 6 (six) hours as needed for headache, mild pain, moderate pain or cramping. 30 tablet 2   Lactic Ac-Citric Ac-Pot Bitart (PHEXXI) 1.8-1-0.4 % GEL Place 5 g vaginally as needed. 60 g 1   meloxicam (MOBIC) 7.5 MG tablet Take 1 tablet (7.5 mg total) by mouth daily. 20 tablet 0   metroNIDAZOLE (FLAGYL) 500 MG tablet Take 1 tablet (500 mg total) by mouth 2 (two) times daily. 14 tablet 0   ondansetron (ZOFRAN) 8 MG tablet TAKE 1 TABLET(8 MG) BY MOUTH EVERY 8 HOURS AS NEEDED FOR NAUSEA OR VOMITING 30 tablet 2   Prenatal Vit-Fe Fumarate-FA (PRENATAL MULTIVITAMIN) TABS tablet Take 1 tablet by mouth daily at 12 noon.     prochlorperazine (COMPAZINE) 10 MG tablet Take 1 tablet (10 mg total) by mouth every 6 (six) hours as needed (Nausea or vomiting). 30 tablet 1   sulfamethoxazole-trimethoprim (BACTRIM DS) 800-160 MG tablet Take 1 tablet by mouth 2 (two) times daily.     SUTAB [phone] MG TABS Take by mouth.     topiramate (TOPAMAX) 50 MG tablet Take 50 mg by mouth daily.     traMADol (ULTRAM) 50 MG tablet Take 1 tablet (50 mg total) by mouth every 6 (six) hours as needed. 30 tablet 0   urea (CARMOL) 10 % cream Apply topically 2 (two) times daily. 71 g 0   No current facility-administered medications for this visit.    PHYSICAL EXAMINATION: ECOG PERFORMANCE STATUS: 1 - Symptomatic but completely ambulatory  Vitals:   08/01/22 0944  BP: 104/80  Pulse: 95  Resp: 14  Temp: 98.1 F (36.7 C)  SpO2: 97%   Wt Readings from Last 3 Encounters:  08/01/22 154 lb 12.8 oz (70.2 kg)  07/18/22 150 lb (68 kg)  07/04/22 159 lb 5 oz (72.3  kg)     GENERAL:alert, no distress and comfortable SKIN: skin color normal, no rashes or significant lesions EYES: normal, Conjunctiva are pink and non-injected, sclera clear  NEURO: alert & oriented x 3 with fluent speech  LABORATORY DATA:  I have reviewed the data as listed    Latest Ref Rng & Units 07/30/2022    1:51 PM 07/18/2022    1:30 PM 07/04/2022    8:57 AM  CBC  WBC 4.0 - 10.5 K/uL 5.5  5.4  5.7   Hemoglobin 12.0 - 15.0 g/dL 14.0  14.6  14.0   Hematocrit 36.0 - 46.0 % 39.5  42.2  40.1   Platelets 150 - 400 K/uL  124  138  112         Latest Ref Rng & Units 07/30/2022    1:51 PM 07/18/2022    1:30 PM 07/04/2022    8:57 AM  CMP  Glucose 70 - 99 mg/dL 90  82  137   BUN 6 - 20 mg/dL _0 Creatinine 0.44 - 1.00 mg/dL 0.60  0.65  0.72   Sodium 135 - 145 mmol/L 140  139  136   Potassium 3.5 - 5.1 mmol/L 3.6  3.9  3.7   Chloride 98 - 111 mmol/L 109  105  104   CO2 22 - 32 mmol/L _1 Calcium 8.9 - 10.3 mg/dL 9.0  9.5  8.5   Total Protein 6.5 - 8.1 g/dL 6.6   6.5   Total Bilirubin 0.3 - 1.2 mg/dL 0.8   0.5   Alkaline Phos 38 - 126 U/L 47   49   AST 15 - 41 U/L 21   19   ALT 0 - 44 U/L 19   20       RADIOGRAPHIC STUDIES: I have personally reviewed the radiological images as listed and agreed with the findings in the report. CT CHEST ABDOMEN PELVIS W CONTRAST  Result Date: 07/31/2022 CLINICAL DATA:  History of metastatic colon cancer, follow-up. * Tracking Code: BO * EXAM: CT CHEST, ABDOMEN, AND PELVIS WITH CONTRAST TECHNIQUE: Multidetector CT imaging of the chest, abdomen and pelvis was performed following the standard protocol during bolus administration of intravenous contrast. RADIATION DOSE REDUCTION: This exam was performed according to the departmental dose-optimization program which includes automated exposure control, adjustment of the mA and/or kV according to patient size and/or use of iterative reconstruction technique. CONTRAST:  131m OMNIPAQUE  IOHEXOL 300 MG/ML  SOLN COMPARISON:  Multiple priors including CT abdomen pelvis May 13, 2022. FINDINGS: CT CHEST FINDINGS Cardiovascular: Accessed right chest Port-A-Cath with tip at the superior cavoatrial junction. No central pulmonary embolus on this nondedicated study. Normal size heart. No significant pericardial effusion/thickening. Mediastinum/Nodes: No supraclavicular adenopathy. No suspicious thyroid nodule. No pathologically enlarged mediastinal, hilar or axillary lymph nodes. The esophagus is grossly unremarkable. Lungs/Pleura: No suspicious pulmonary nodules or masses. No focal airspace consolidation. No pleural effusion. No pneumothorax. Musculoskeletal: No chest wall mass or suspicious bone lesions identified. CT ABDOMEN PELVIS FINDINGS Hepatobiliary: Similar postoperative changes of posterior right segment VI/VII partial hepatectomy. No new suspicious hepatic lesions. Gallbladder not confidently identified and may be surgically absent or decompressed. No biliary ductal dilation. Pancreas: No pancreatic ductal dilation or evidence of acute inflammation. Spleen: No splenomegaly. Adrenals/Urinary Tract: Bilateral adrenal glands appear normal. No hydronephrosis. Kidneys demonstrate symmetric enhancement. Urinary bladder is unremarkable for degree of distension. Stomach/Bowel: Radiopaque enteric contrast material traverses the ascending colon. Stomach is unremarkable for degree of distension. No pathologic dilation of small or large bowel. Moderate volume of formed stool throughout the colon. Prior partial sigmoidectomy with rectal colonic anastomotic sutures. Vascular/Lymphatic: Normal caliber abdominal aorta. No pathologically enlarged abdominal or pelvic lymph nodes. Reproductive: Uterine leiomyomas. Prior right oophorectomy. Hypodense 3.4 cm lesion in the left ovary on image 107/2. Other: No significant abdominopelvic free fluid. No discrete peritoneal or omental nodularity. Postsurgical change in  the abdominal wall. Musculoskeletal: No aggressive lytic or blastic lesion of bone. IMPRESSION: 1. Hypodense 3.4 cm lesion in the left ovary, likely reflecting a cyst but incompletely characterized on this examination, suggest further evaluation with pelvic ultrasound given patient's prior  history of right oophorectomy for metastatic disease. 2. Otherwise, no evidence of new or progressive metastatic disease within the chest, abdomen, or pelvis. 3. Similar postoperative changes of partial right hepatectomy, partial sigmoidectomy and rectal colonic anastomotic sutures. 4. Moderate volume of formed stool throughout the colon. Correlate for constipation. Electronically Signed   By: Dahlia Bailiff M.D.   On: 07/31/2022 13:45      Orders Placed This Encounter  Procedures   NM PET Image Initial (PI) Skull Base To Thigh    Abnormal CT show, new left ovarian cyst, rule out cancer recurrence. History of peritoneal mets to right ovary and s/o surgical resection.    Standing Status:   Future    Standing Expiration Date:   08/01/2023    Order Specific Question:   If indicated for the ordered procedure, I authorize the administration of a radiopharmaceutical per Radiology protocol    Answer:   Yes    Order Specific Question:   Is the patient pregnant?    Answer:   No    Order Specific Question:   Preferred imaging location?    Answer:   [facility]   US PELVIC COMPLETE WITH TRANSVAGINAL    Standing Status:   Future    Standing Expiration Date:   08/02/2023    Order Specific Question:   Reason for Exam (SYMPTOM  OR DIAGNOSIS REQUIRED)    Answer:   left ovarian cyst, rule out cancer recurrence    Order Specific Question:   Preferred imaging location?    Answer:   [facility]   All questions were answered. The patient knows to call the clinic with any problems, questions or concerns. No barriers to learning was detected. The total time spent in the appointment was 30 minutes.     [name],  MD 08/01/2022   I, Wilburn Mylar, am acting as scribe for [name], MD.   I have reviewed the above documentation for accuracy and completeness, and I agree with the above.

## 2022-08-02 ENCOUNTER — Other Ambulatory Visit: Payer: Self-pay

## 2022-08-04 ENCOUNTER — Ambulatory Visit (HOSPITAL_COMMUNITY)
Admission: RE | Admit: 2022-08-04 | Discharge: 2022-08-04 | Disposition: A | Discharge status: Home / Self Care | Payer: BC Managed Care – PPO | Source: Ambulatory Visit | Attending: Hematology | Admitting: Hematology

## 2022-08-04 DIAGNOSIS — C19 Malignant neoplasm of rectosigmoid junction: Secondary | ICD-10-CM | POA: Insufficient documentation

## 2022-08-05 ENCOUNTER — Other Ambulatory Visit (HOSPITAL_COMMUNITY): Payer: Self-pay

## 2022-08-06 ENCOUNTER — Other Ambulatory Visit (HOSPITAL_COMMUNITY): Payer: Self-pay

## 2022-08-07 ENCOUNTER — Other Ambulatory Visit: Payer: Self-pay

## 2022-08-12 ENCOUNTER — Telehealth: Payer: Self-pay

## 2022-08-12 NOTE — Telephone Encounter (Signed)
Pt called stating she's have right ovarian/pelvis pain and is taking the Tramadol that Dr. Burr Medico prescribed but it's not really reducing the pain.  Pt is asking in Dr. Burr Medico could prescribe her a few days worth of Vicodin like she did last time.  Pt stated the Vicodin help.  Notified Dr. Burr Medico of the pt's request.

## 2022-08-13 ENCOUNTER — Other Ambulatory Visit: Payer: Self-pay | Admitting: Hematology

## 2022-08-13 ENCOUNTER — Other Ambulatory Visit: Payer: Self-pay

## 2022-08-13 ENCOUNTER — Ambulatory Visit
Admission: RE | Admit: 2022-08-13 | Discharge: 2022-08-13 | Disposition: A | Discharge status: Home / Self Care | Payer: BC Managed Care – PPO | Source: Ambulatory Visit | Attending: Hematology | Admitting: Hematology

## 2022-08-13 ENCOUNTER — Encounter: Payer: Self-pay | Admitting: Hematology

## 2022-08-13 DIAGNOSIS — K439 Ventral hernia without obstruction or gangrene: Secondary | ICD-10-CM | POA: Diagnosis not present

## 2022-08-13 DIAGNOSIS — C19 Malignant neoplasm of rectosigmoid junction: Secondary | ICD-10-CM | POA: Insufficient documentation

## 2022-08-13 DIAGNOSIS — N83201 Unspecified ovarian cyst, right side: Secondary | ICD-10-CM

## 2022-08-13 LAB — GLUCOSE, CAPILLARY: Glucose-Capillary: 86 mg/dL (ref 70–99)

## 2022-08-13 MED ORDER — HYDROCODONE-ACETAMINOPHEN 5-325 MG PO TABS: 1.0000 | ORAL_TABLET | Freq: Four times a day (QID) | ORAL | 0 refills | Status: DC | PRN

## 2022-08-13 MED ORDER — FLUDEOXYGLUCOSE F - 18 (FDG) INJECTION
8.0000 | Freq: Once | INTRAVENOUS | Status: AC | PRN
  Administered 2022-08-13: 8.69 via INTRAVENOUS

## 2022-08-15 ENCOUNTER — Other Ambulatory Visit: Payer: Self-pay

## 2022-08-15 ENCOUNTER — Other Ambulatory Visit (HOSPITAL_COMMUNITY): Payer: Self-pay

## 2022-08-15 ENCOUNTER — Inpatient Hospital Stay (HOSPITAL_BASED_OUTPATIENT_CLINIC_OR_DEPARTMENT_OTHER): Payer: BC Managed Care – PPO | Admitting: Hematology

## 2022-08-15 ENCOUNTER — Encounter: Payer: Self-pay | Admitting: Hematology

## 2022-08-15 VITALS — BP 109/87 | HR 86 | Temp 98.4°F | Resp 15 | Wt 154.9 lb

## 2022-08-15 DIAGNOSIS — C19 Malignant neoplasm of rectosigmoid junction: Secondary | ICD-10-CM

## 2022-08-15 MED ORDER — CAPECITABINE 500 MG PO TABS
ORAL_TABLET | ORAL | 0 refills | Status: DC
  Filled 2022-08-15: qty 70, fill #0
  Filled 2022-08-18: qty 70, 21d supply, fill #0

## 2022-08-15 NOTE — Progress Notes (Signed)
[place]   [phone]) [phone] [phone]) [phone]   Clinic Follow up Note   Patient Care Team: [facility] as PCP - General [name], MD as Consulting Physician (Oncology) [name], MD as Consulting Physician (General Surgery) [name], NP as Nurse Practitioner (Nurse Practitioner) Griffin Basil, MD as Consulting Physician (Obstetrics and Gynecology)  Date of Service:  08/15/2022  CHIEF COMPLAINT: f/u of metastatic colon cancer  CURRENT THERAPY:  -Xeloda, q21d, starting 03/28/22, currently 1550m am and 10029mpm day 1-14 from 05/19/22   ASSESSMENT:  Stephanie [name] a [age].o. female with   1. Adenocarcinoma of the rectosigmoid colon, grade 2, [id number] IV with oligo liver metastasis; MMR normal, KRAS (+), right ovarian metastasis in 01/2022  -diagnosed 03/2020 by urgent colectomy for abdominal abscess. Liver metastasis confirmed by biopsy 04/16/20. -Foundation One showed K-ras mutation, she is not a candidate for EGFR inhibitor -s/p FOLFOXIRI/bevacizumab  05/23/20 - 10/24/20. 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 -s/p liver resection on 12/13/20 under Dr. ByBarry Dienes -right ovary metastasis confirmed by oophorectomy 02/25/22. -CAPEOX started 03/28/22, oxali discontinued after 05/23/22 due to recurrent infusion reaction. -she continues on Xeloda with good tolerance. -restaging CT CAP 07/30/22 showed: 3.4 cm lesion in left ovary, likely a cyst; otherwise, no evidence of new or progressive disease. -CEA has been WNL/stable since initially taken after colectomy. -further evaluation with pelvic USKorean 08/04/22 showed a 3 cm mass in left ovary, favored benign and likely a hemorrhagic cyst.  -PET scan on 08/13/22 showed a hypermetabolic focus in pelvis associated with right fundal region of uterus, without obvious CT abnormality, likely representing a benign fibroid. No findings for local recurrence or metastatic  disease. -I personally reviewed the images and results with her today. I reassured her the findings are consistent with benign findings, not concerning for cancer metastasis. I explained I will still refer her to Dr. TuBerline Lopesor discussion and f/u recommendations. -she continues to do well with Xeloda. She reports fatigue and increasing skin irritation towards the end of the cycle. Her next cycle will be her last; I refilled today.   2. Social Support, Anxiety and Depression -her recent home situation has resolved, but she is still dealing with custody issues/issues with her daughter's father.   3.  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 More so in her feet, but she does note some functional deficits in her fingers. -stable     Plan: -continue Xeloda at same dose, she will start next cyle in 2 weeks (last cycle) -lab, flush, and f/u in 6-7 weeks, will order restaging CT on next visit    SUMMARY OF ONCOLOGIC HISTORY: Oncology History Overview Note  Cancer Staging Malignant [facility] of [facility]osigmoid junction ([facility]: Colon and Rectum, AJCC 8th Edition - Pathologic stage from 04/04/2020: pT4a, pN1b, cM1 - Signed by BuAlla FeelingNP on 05/07/2020    Malignant ne[place] of [place]osigmoid junction ([facility] 11/10/2019 Imaging   MRI Abdomen  IMPRESSION: 1. Redemonstrated hypoenhancing lesion of the posterior liver dome, hepatic segment VII, reduced in size compared to prior examination, measuring 1.3 x 1.3 cm, previously 1.8 x 1.7 cm. Findings are consistent with treatment response of a biopsy proven metastasis. No other evidence of lymphadenopathy or metastatic disease within the abdomen or pelvis. 2. Unchanged mild splenomegaly, maximum coronal span 14.0 cm. 3. 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  03/12/2020 Initial Diagnosis   Malignant neoplasm of rectosigmoid junction ([place])    03/12/2020 Imaging   CT AP with contrast IMPRESSION: 1. Overall findings are highly concerning for colorectal carcinoma involving the sigmoid colon with an associated perforation and adjacent abscess and phlegmon formation as detailed above. Currently, no collection is amenable to percutaneous drainage given their small size and location. 2. New 2 cm mass in the right hepatic lobe concerning for metastatic disease to the liver until proven otherwise. 3. Enlarged regional lymph nodes as detailed above is concerning for nodal metastatic disease. 4. Large stool burden. 5. Prominent pelvic veins which can be seen in patients with pelvic congestion syndrome.   03/13/2020 Imaging   ABD US IMPRESSION: Approximately 2.1 x 2.4 x 2.0 cm lobular homogeneously echogenic lesion in the right hepatic dome corresponds with the abnormality seen on the prior CT scan. Sonographically, this appearance is highly suggestive of a benign hemangioma.   Recommend MRI of the abdomen with gadolinium contrast which may provide a noninvasive diagnosis of benign hemangioma.    03/13/2020 Imaging   MR ABD W/WO CONTRAST Hepatobiliary: Diffuse low signal intensity throughout the hepatic parenchyma on T2 weighted images, presumably a consequence of recent Feraheme injection. In segment 7 of the liver (axial image 8 of series 5) there is a 2.5 x 1.9 cm well-defined lesion which is slightly T2 hyperintense. This lesion appears hyperintense on pre gadolinium T1 weighted images (likely a consequence of Feraheme). Interpretation of enhancement within the lesion is compromised by presence of Feraheme. No other hepatic lesions are confidently identified on today's examination. No intra or extrahepatic biliary ductal dilatation. Gallbladder is normal in appearance.   03/31/2020 Imaging   CT AP W contrast IMPRESSION: 1. Previously noted sigmoid colon mass appears increased in size, and again appears to be associated with a  focal contained perforation which crosses the midline and has fistulized into the left adnexal region where there is now what appears to be a large left tubo-ovarian abscess, as detailed above. This is also associated with multiple enlarged lymph nodes in the pelvis measuring up to 1.2 cm in short axis and borderline enlarged retroperitoneal lymph nodes, concerning for metastatic disease. In addition, previously suspected metastatic lesion in segment 7 of the liver has enlarged. 2. Small volume of ascites. 3. Additional incidental findings, as above.   04/04/2020 Cancer Staging   Staging form: Colon and Rectum, AJCC 8th Edition - Pathologic stage from 04/04/2020: pT4a, pN1b, cM1 - Signed by [name], NP on 05/07/2020   04/04/2020 Procedure   Paracentesis, path showed no malignant cells (mixed acute and chronic inflammation present)   04/04/2020 Surgery   Open sigmoid colectomy and end colostomy by Dr. [name]   04/04/2020 Pathology Results   FINAL MICROSCOPIC DIAGNOSIS: A. COLON, RECTOSIGMOID, RESECTION: - Invasive moderately differentiated adenocarcinoma, 6 cm, involving rectosigmoid junction - Carcinoma invades into serosal surface with perforation and associated serositis - Radial resection margin is positive for carcinoma; proximal and distal margins are not involved - Lymphovascular invasion is present - Metastatic carcinoma to one of fifteen lymph nodes (1/15); one tumor deposit - See oncology table B. LYMPH NODES, MESENTERIC, RESECTION: - Metastatic adenocarcinoma to one of six lymph nodes (1/6) - One tumor deposit  Addendum to note 2 involved lymph nodes (of 21 examined nodes) pT4a,pN1b MMR-normal, preserved expression of MLH1, MSH2, MSH6, PMS2   04/12/2020 Procedure   PAC placement    04/13/2020 Imaging   CT chest without contrast IMPRESSION: Interval  development of bilateral pleural effusions, left slightly greater than right, with resultant bibasilar atelectasis  including subtotal collapse of the left lower lobe. No evidence of intrathoracic metastatic disease, though evaluation of the collapsed parenchyma is limited. Hepatic metastasis again demonstrated.     04/16/2020 Pathology Results   FINAL MICROSCOPIC DIAGNOSIS:  A. LIVER, RIGHT LOBE, BIOPSY:  - Adenocarcinoma.  COMMENT:  The morphology is compatible with the provided clinical history of colorectal carcinoma.    05/23/2020 - 10/24/2020 Chemotherapy   FOLFIRINOX q2weeks for 3-6 months starting 05/23/20. Bevacizumab-bvzr [name] added with C2. Oxaliplatin held with C11-12 due to reaction. (pt developed SOB, chest palpitation and abdominal discomfort shortly after oxaliplatin started). Completed on 10/24/20.   08/05/2020 Imaging   CT AP  IMPRESSION: 1. Postsurgical changes of distal colectomy with a left lower quadrant end ostomy. No evidence of obstruction or acute complication at this time. Excluded rectal pouch in the deep pelvis without acute complication or worrisome features. 2. Slight interval decrease in size of a hypoattenuating lesion posterior right lobe liver measuring 1.7 x 1.8 x 2 cm. This lesion has previously undergone ultrasound-guided biopsy with pathologic results demonstrating adenocarcinoma compatible with metastatic disease from patient's resected colorectal carcinoma. 3. Slight prominence of the parametrial vessels bilaterally, nonspecific though can be seen in the setting of pelvic congestion syndrome. 4. Mild splenomegaly.  No focal lesion.     11/12/2020 Imaging   CT Chest  IMPRESSION: 1. No evidence of metastatic disease in the chest. 2. 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   12/13/2020 Surgery   A. LIVER, RIGHT, PARTIAL HEPATECTOMY WITH GALLBLADDER:  - Metastatic colon carcinoma to the liver showing approximately 80%  necrosis  - Resection margin is 0.8 cm from carcinoma  - Uninvolved liver parenchyma with  no specific histopathologic changes  - Gallbladder with no specific histopathologic changes    01/28/2021 Genetic Testing   Negative genetic testing:  No pathogenic variants detected on the [employer] CustomNext-Cancer + RNAinsight panel. The report date is 01/28/2021.   The CustomNext-Cancer+RNAinsight panel offered by [facility] included sequencing and rearrangement analysis for the following 47 genes:  APC, ATM, AXIN2, BARD1, BMPR1A, BRCA1, BRCA2, BRIP1, CDH1, CDK4, CDKN2A, CHEK2, DICER1, EPCAM, GREM1, HOXB13, MEN1, MLH1, MSH2, MSH3, MSH6, MUTYH, NBN, NF1, NF2, NTHL1, PALB2, PMS2, POLD1, POLE, PTEN, RAD51C, RAD51D, RECQL, RET, SDHA, SDHAF2, SDHB, SDHC, SDHD, SMAD4, SMARCA4, STK11, TP53, TSC1, TSC2, and VHL.  RNA data is routinely analyzed for use in variant interpretation for all genes.   02/19/2021 Imaging   CT A/P w/o contrast  IMPRESSION: Slightly limited examination examination in absence of contrast administration. Status post partial right hepatectomy. Interval decrease in size in perihepatic fluid collection and resolution of right subdiaphragmatic fluid and gas. No new intra-abdominal fluid collections are identified.   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 Moderate stool throughout the colon without evidence of obstruction.   Fluid distension of the proximal duodenum to the level of the SMA hiatus which appears narrow. 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   05/05/2021 Imaging   CT AP  IMPRESSION: Postsurgical changes as described stable in appearance from the prior exam.   Changes suggestive of mild pelvic varices.   08/22/2021 Imaging   EXAM: CT ABDOMEN AND PELVIS WITH CONTRAST  IMPRESSION: 1. Extensive bowel content is identified throughout the colon consistent with constipation. 2.  Findings of descending colostomy  with anastomosis at the rectosigmoid junction is unchanged. 3. Stable postoperative changes in the right lobe liver.   09/12/2021 Survivorship   SCP delivered by Cira Rue, NP   10/05/2021 Imaging   EXAM: CT ABDOMEN AND PELVIS WITH CONTRAST  IMPRESSION: 1. Right ovarian/adnexal cyst, 5.2 cm. Because this lesion is not adequately characterized, prompt [place] is recommended for further evaluation. Note: This recommendation does not apply to premenarchal patients and to those with increased risk (genetic, family history, elevated tumor markers or other high-risk factors) of ovarian cancer. Reference: JACR 2020 Feb; 17(2):248-254 2. No other evidence of an acute abnormality within the abdomen or pelvis. 3. Moderate increase in the colonic and rectal stool burden. No bowel obstruction or inflammation. 4. No evidence of locally recurrent or metastatic colon carcinoma.   11/12/2021 Imaging   EXAM: CT CHEST WITHOUT CONTRAST  IMPRESSION: 1. No evidence of pulmonary metastatic disease. 2. Stable postoperative findings about the posterior right lobe of the liver.   03/28/2022 - 04/18/2022 Chemotherapy   Patient is on Treatment Plan : COLORECTAL Xelox (Capeox) q21d     05/23/2022 - 05/23/2022 Chemotherapy   Patient is on Treatment Plan : COLORECTAL CAPEOX (130/850) q21d x 8 cycles        INTERVAL HISTORY:  Stephanie [name] is here for a follow up of metastatic colon cancer. She was last seen by me on 08/01/22. She presents to the clinic alone. She reports she got her period a few days ago, and it has been painful this time. She notes the pain began before the bleeding started. She reports she continues to tolerate Xeloda well. She notes she develops increasing skin irritation towards the end of the two weeks.   All other systems were reviewed with the patient and are negative.  MEDICAL HISTORY:  Past Medical History:  Diagnosis Date   Anxiety    Blood transfusion without reported  diagnosis    Bowel obstruction (HCC)    BV (bacterial vaginosis)    Colon cancer ([place])    Colon Cancer 03-2020   Colon polyps    Colostomy present ([place])    03-2020   Family history of bladder cancer    History of chemotherapy    ended 09-2020   Lactose intolerance 03/12/2020   Neuromuscular disorder (HCC)    neuropathy feet hands legs   UTI (lower urinary tract infection)     SURGICAL HISTORY: Past Surgical History:  Procedure Laterality Date   CESAREAN SECTION     CHOLECYSTECTOMY  12/13/2020   COLECTOMY  03/2020   COLONOSCOPY     COLOSTOMY TAKEDOWN N/A 05/30/2021   Procedure: LAPAROSCOPIC ASSISTED COLOSTOMY TAKEDOWN;  Surgeon: [name], MD;  Location: [place];  Service: General;  Laterality: N/A;   CYSTOSCOPY WITH STENT PLACEMENT  04/04/2020   Procedure: CYSTOSCOPY WITH STENT PLACEMENT;  Surgeon: [name], MD;  Location: [facility];  Service: General;;   LAPAROSCOPIC LIVER ULTRASOUND N/A 12/13/2020   Procedure: INTRAOPERATIVE LIVER ULTRASOUND;  Surgeon: [name], MD;  Location: [place];  Service: General;  Laterality: N/A;   LAPAROSCOPY N/A 12/13/2020   Procedure: LAPAROSCOPY DIAGNOSTIC;  Surgeon: [name], MD;  Location: [place];  Service: General;  Laterality: N/A;   LAPAROTOMY N/A 04/04/2020   Procedure: EXPLORATORY LAPAROTOMY;  Surgeon: [name], MD;  Location: [facility];  Service: General;  Laterality: N/A;   OPEN PARTIAL HEPATECTOMY  N/A 12/13/2020   Procedure: OPEN PARTIAL HEPATECTOMY;  Surgeon: [name], MD;  Location: [place];  Service: General;  Laterality: N/A;  ROOM 2 STARTING AT 09:30AM FOR 300 MIN   PORTACATH PLACEMENT Right 04/12/2020   Procedure: INSERTION PORT-A-CATH WITH ULTRASOUND;  Surgeon: Kinsinger, Arta Bruce, MD;  Location: [facility];  Service: General;  Laterality: Right;    I have reviewed the social history and family history with the patient and they are unchanged from previous note.  ALLERGIES:  is allergic to oxaliplatin.  MEDICATIONS:   Current Outpatient Medications  Medication Sig Dispense Refill   acetaminophen (TYLENOL) 325 MG tablet Take 2 tablets (650 mg total) by mouth every 6 (six) hours as needed for mild pain, moderate pain, fever or headache (fever > 101).     AUROVELA 1.5/30 1.5-30 MG-MCG tablet TAKE ONE TABLET BY MOUTH DAILY 21 tablet 6   capecitabine (XELODA) 500 MG tablet Take 3 tablets in morning and 2 tablets in evening, every 12 hours.  Take for 14 days on, then off 7 days. Repeat every 21 days. 70 tablet 0   HYDROcodone-acetaminophen (NORCO/VICODIN) 5-325 MG tablet Take 1 tablet by mouth every 6 (six) hours as needed for severe pain. 10 tablet 0   ibuprofen (ADVIL) 600 MG tablet Take 1 tablet (600 mg total) by mouth every 6 (six) hours as needed for headache, mild pain, moderate pain or cramping. 30 tablet 2   Lactic Ac-Citric Ac-Pot Bitart (PHEXXI) 1.8-1-0.4 % GEL Place 5 g vaginally as needed. 60 g 1   meloxicam (MOBIC) 7.5 MG tablet Take 1 tablet (7.5 mg total) by mouth daily. 20 tablet 0   metroNIDAZOLE (FLAGYL) 500 MG tablet Take 1 tablet (500 mg total) by mouth 2 (two) times daily. 14 tablet 0   ondansetron (ZOFRAN) 8 MG tablet TAKE 1 TABLET(8 MG) BY MOUTH EVERY 8 HOURS AS NEEDED FOR NAUSEA OR VOMITING 30 tablet 2   Prenatal Vit-Fe Fumarate-FA (PRENATAL MULTIVITAMIN) TABS tablet Take 1 tablet by mouth daily at 12 noon.     prochlorperazine (COMPAZINE) 10 MG tablet Take 1 tablet (10 mg total) by mouth every 6 (six) hours as needed (Nausea or vomiting). 30 tablet 1   sulfamethoxazole-trimethoprim (BACTRIM DS) 800-160 MG tablet Take 1 tablet by mouth 2 (two) times daily.     SUTAB [id number] MG TABS Take by mouth.     topiramate (TOPAMAX) 50 MG tablet Take 50 mg by mouth daily.     traMADol (ULTRAM) 50 MG tablet Take 1 tablet (50 mg total) by mouth every 6 (six) hours as needed. 30 tablet 0   urea (CARMOL) 10 % cream Apply topically 2 (two) times daily. 71 g 0   No current facility-administered  medications for this visit.    PHYSICAL EXAMINATION: ECOG PERFORMANCE STATUS: 1 - Symptomatic but completely ambulatory  Vitals:   08/15/22 1518  BP: 109/87  Pulse: 86  Resp: 15  Temp: 98.4 F (36.9 C)  SpO2: 99%   Wt Readings from Last 3 Encounters:  08/15/22 154 lb 14.4 oz (70.3 kg)  08/01/22 154 lb 12.8 oz (70.2 kg)  07/18/22 150 lb (68 kg)     GENERAL:alert, no distress and comfortable SKIN: skin color normal, no rashes or significant lesions EYES: normal, Conjunctiva are pink and non-injected, sclera clear  NEURO: alert & oriented x 3 with fluent speech  LABORATORY DATA:  I have reviewed the data as listed    Latest Ref Rng & Units 07/30/2022    1:51 PM 07/18/2022    1:30 PM 07/04/2022    8:57 AM  CBC  WBC 4.0 - 10.5 K/uL 5.5  5.4  5.7   Hemoglobin 12.0 - 15.0 g/dL 14.0  14.6  14.0   Hematocrit 36.0 - 46.0 % 39.5  42.2  40.1   Platelets 150 - 400 K/uL 124  138  112         Latest Ref Rng & Units 07/30/2022    1:51 PM 07/18/2022    1:30 PM 07/04/2022    8:57 AM  CMP  Glucose 70 - 99 mg/dL 90  82  137   BUN 6 - 20 mg/dL _0 Creatinine 0.44 - 1.00 mg/dL 0.60  0.65  0.72   Sodium 135 - 145 mmol/L 140  139  136   Potassium 3.5 - 5.1 mmol/L 3.6  3.9  3.7   Chloride 98 - 111 mmol/L 109  105  104   CO2 22 - 32 mmol/L _1 Calcium 8.9 - 10.3 mg/dL 9.0  9.5  8.5   Total Protein 6.5 - 8.1 g/dL 6.6   6.5   Total Bilirubin 0.3 - 1.2 mg/dL 0.8   0.5   Alkaline Phos 38 - 126 U/L 47   49   AST 15 - 41 U/L 21   19   ALT 0 - 44 U/L 19   20       RADIOGRAPHIC STUDIES: I have personally reviewed the radiological images as listed and agreed with the findings in the report. No results found.    Orders Placed This Encounter  Procedures   Ambulatory referral to Gynecologic Oncology    Referral Priority:   Routine    Referral Type:   Consultation    Referral Reason:   Specialty Services Required    Requested Specialty:   Gynecologic Oncology     Number of Visits Requested:   1   All questions were answered. The patient knows to call the clinic with any problems, questions or concerns. No barriers to learning was detected. The total time spent in the appointment was 30 minutes.     [name], MD 08/15/2022   I, Wilburn Mylar, am acting as scribe for [name], MD.   I have reviewed the above documentation for accuracy and completeness, and I agree with the above.

## 2022-08-18 ENCOUNTER — Telehealth: Payer: Self-pay | Admitting: Hematology

## 2022-08-18 ENCOUNTER — Other Ambulatory Visit (HOSPITAL_COMMUNITY): Payer: Self-pay

## 2022-08-18 NOTE — Telephone Encounter (Signed)
Called patient regarding upcoming appointments unable to leave message vm full.

## 2022-08-19 ENCOUNTER — Other Ambulatory Visit (HOSPITAL_COMMUNITY): Payer: Self-pay

## 2022-08-22 ENCOUNTER — Other Ambulatory Visit (HOSPITAL_COMMUNITY): Payer: Self-pay

## 2022-09-08 ENCOUNTER — Other Ambulatory Visit (HOSPITAL_COMMUNITY): Payer: Self-pay

## 2022-09-09 ENCOUNTER — Other Ambulatory Visit (HOSPITAL_COMMUNITY): Payer: Self-pay

## 2022-09-19 ENCOUNTER — Encounter: Payer: Self-pay | Admitting: Hematology

## 2022-09-27 ENCOUNTER — Emergency Department (HOSPITAL_COMMUNITY): Payer: Medicare Other

## 2022-09-27 ENCOUNTER — Emergency Department (HOSPITAL_COMMUNITY)
Admission: EM | Admit: 2022-09-27 | Discharge: 2022-09-27 | Disposition: A | Discharge status: Home / Self Care | Payer: Medicare Other | Attending: Emergency Medicine | Admitting: Emergency Medicine

## 2022-09-27 ENCOUNTER — Encounter (HOSPITAL_COMMUNITY): Payer: Self-pay

## 2022-09-27 DIAGNOSIS — Z20822 Contact with and (suspected) exposure to covid-19: Secondary | ICD-10-CM | POA: Diagnosis not present

## 2022-09-27 DIAGNOSIS — Z859 Personal history of malignant neoplasm, unspecified: Secondary | ICD-10-CM | POA: Insufficient documentation

## 2022-09-27 DIAGNOSIS — K209 Esophagitis, unspecified without bleeding: Secondary | ICD-10-CM | POA: Diagnosis not present

## 2022-09-27 DIAGNOSIS — J111 Influenza due to unidentified influenza virus with other respiratory manifestations: Secondary | ICD-10-CM | POA: Insufficient documentation

## 2022-09-27 DIAGNOSIS — R109 Unspecified abdominal pain: Secondary | ICD-10-CM | POA: Diagnosis present

## 2022-09-27 DIAGNOSIS — K2980 Duodenitis without bleeding: Secondary | ICD-10-CM | POA: Insufficient documentation

## 2022-09-27 LAB — COMPREHENSIVE METABOLIC PANEL
ALT: 18 U/L (ref 0–44)
AST: 17 U/L (ref 15–41)
Albumin: 3.9 g/dL (ref 3.5–5.0)
Alkaline Phosphatase: 59 U/L (ref 38–126)
Anion gap: 5 (ref 5–15)
BUN: 12 mg/dL (ref 6–20)
CO2: 21 mmol/L — ABNORMAL LOW (ref 22–32)
Calcium: 8.8 mg/dL — ABNORMAL LOW (ref 8.9–10.3)
Chloride: 111 mmol/L (ref 98–111)
Creatinine, Ser: 0.51 mg/dL (ref 0.44–1.00)
GFR, Estimated: >60 mL/min (ref >60)
Glucose, Bld: 89 mg/dL (ref 70–99)
Potassium: 3.9 mmol/L (ref 3.5–5.1)
Sodium: 137 mmol/L (ref 135–145)
Total Bilirubin: 0.3 mg/dL (ref 0.3–1.2)
Total Protein: 6.5 g/dL (ref 6.5–8.1)

## 2022-09-27 LAB — LACTIC ACID, PLASMA: Lactic Acid, Venous: 0.7 mmol/L (ref 0.5–1.9)

## 2022-09-27 LAB — CBC WITH DIFFERENTIAL/PLATELET
Abs Immature Granulocytes: 0.01 10*3/uL (ref 0.00–0.07)
Basophils Absolute: 0 10*3/uL (ref 0.0–0.1)
Basophils Relative: 0 %
Eosinophils Absolute: 0 10*3/uL (ref 0.0–0.5)
Eosinophils Relative: 1 %
HCT: 40.8 % (ref 36.0–46.0)
Hemoglobin: 13.9 g/dL (ref 12.0–15.0)
Immature Granulocytes: 0 %
Lymphocytes Relative: 12 %
Lymphs Abs: 0.5 10*3/uL — ABNORMAL LOW (ref 0.7–4.0)
MCH: 34.2 pg — ABNORMAL HIGH (ref 26.0–34.0)
MCHC: 34.1 g/dL (ref 30.0–36.0)
MCV: 100.2 fL — ABNORMAL HIGH (ref 80.0–100.0)
Monocytes Absolute: 0.5 10*3/uL (ref 0.1–1.0)
Monocytes Relative: 13 %
Neutro Abs: 2.9 10*3/uL (ref 1.7–7.7)
Neutrophils Relative %: 74 %
Platelets: 82 10*3/uL — ABNORMAL LOW (ref 150–400)
RBC: 4.07 MIL/uL (ref 3.87–5.11)
RDW: 13.2 % (ref 11.5–15.5)
WBC: 4 10*3/uL (ref 4.0–10.5)
nRBC: 0 % (ref 0.0–0.2)

## 2022-09-27 LAB — URINALYSIS, ROUTINE W REFLEX MICROSCOPIC
Bilirubin Urine: NEGATIVE
Glucose, UA: NEGATIVE mg/dL
Hgb urine dipstick: NEGATIVE
Ketones, ur: NEGATIVE mg/dL
Leukocytes,Ua: NEGATIVE
Nitrite: NEGATIVE
Protein, ur: NEGATIVE mg/dL
Specific Gravity, Urine: 1.003 — ABNORMAL LOW (ref 1.005–1.030)
pH: 6 (ref 5.0–8.0)

## 2022-09-27 LAB — RESP PANEL BY RT-PCR (RSV, FLU A&B, COVID)  RVPGX2
Influenza A by PCR: NEGATIVE
Influenza B by PCR: POSITIVE — AB
Resp Syncytial Virus by PCR: NEGATIVE
SARS Coronavirus 2 by RT PCR: NEGATIVE

## 2022-09-27 LAB — LIPASE, BLOOD: Lipase: 50 U/L (ref 11–51)

## 2022-09-27 MED ORDER — PANTOPRAZOLE SODIUM 40 MG PO TBEC: 40.0000 mg | DELAYED_RELEASE_TABLET | Freq: Every day | ORAL | 0 refills | Status: DC

## 2022-09-27 MED ORDER — ALUM & MAG HYDROXIDE-SIMETH 200-200-20 MG/5ML PO SUSP: 30.0000 mL | Freq: Once | ORAL | Status: DC

## 2022-09-27 MED ORDER — LIDOCAINE VISCOUS HCL 2 % MT SOLN: 15.0000 mL | Freq: Once | OROMUCOSAL | Status: DC

## 2022-09-27 MED ORDER — IOHEXOL 350 MG/ML SOLN
100.0000 mL | Freq: Once | INTRAVENOUS | Status: AC | PRN
  Administered 2022-09-27: 100 mL via INTRAVENOUS

## 2022-09-27 MED ORDER — FAMOTIDINE 20 MG PO TABS: 20.0000 mg | ORAL_TABLET | Freq: Two times a day (BID) | ORAL | 0 refills | Status: DC

## 2022-09-27 MED ORDER — OSELTAMIVIR PHOSPHATE 75 MG PO CAPS: 75.0000 mg | ORAL_CAPSULE | Freq: Two times a day (BID) | ORAL | 0 refills | Status: DC

## 2022-09-27 MED ORDER — IOHEXOL 300 MG/ML  SOLN: 100.0000 mL | Freq: Once | INTRAMUSCULAR | Status: DC | PRN

## 2022-09-27 NOTE — ED Triage Notes (Signed)
Pt presents with flank pain on both sides. Pt reports she has a hx of UTI's and is concerned that she may have a UTI at this time. Pt also reports a fever at home and believes she has the flu as her daughter has had the flu and she has been taking care of her. Pt recently finished chemo approx 2 weeks ago.

## 2022-09-27 NOTE — Discharge Instructions (Addendum)
Your CT imaging revealed the following: IMPRESSION: 1. No evidence of pulmonary embolism. 2. Mild diffuse esophageal wall thickening, suggestive of esophagitis. 3. Possible wall thickening versus underdistention of the proximal duodenum, correlate for symptoms of duodenitis. 4. Thinly septated 5.7 cm cystic structure in the left adnexa has increased in size since November but did not demonstrate suspicious features on ultrasound and was without abnormal FDG avidity identified on PET-CT August 13, 2022. While the appearance on prior ultrasound and PET-CT are somewhat reassuring, given the increase in size since prior studies and patient's history of right ovarian metastases this warrants further evaluation with nonemergent pelvic ultrasound. 5. Similar postoperative changes of prior posterior segment VI/VII partial hepatectomy and partial sigmoidectomy with rectal colonic anastomotic sutures without evidence of local recurrence.   Duodenitis could explain your right upper abdominal and back discomfort. It just means inflammation of the duodenum which can have many causes. You have a lesion on your left ovary that has grown in size. It is recommended that you follow-up outpatient for a non-emergent ultrasound. We will treat the findings of possible esophagitis and duodenitis with Pepcid and Protonix. A referral has been placed for outpatient follow-up with gastroenterology. A gastroenterologist can use a scope to visualize your duodenum and take a biopsy of the tissue. Follow-up outpatient with your oncologist as well.

## 2022-09-27 NOTE — ED Provider Triage Note (Cosign Needed)
Emergency Medicine Provider Triage Evaluation Note  Stephanie [name] , a 41 y.o. female  was evaluated in triage.  Pt complains of dysuria, body aches, back pain, flank..  Patient reports metastatic colon cancer to the ovaries, she just finished a round of chemotherapy 2 weeks ago, also has been exposed to someone with the flu and thinks that she may have the flu.  Reports fever of 101.5 this morning, improved with motrin, ice.  Review of Systems  Positive: Flank pain, bodyaches, dysuria, fever Negative: Nausea, vomiting  Physical Exam  BP 117/80 (BP Location: Left Arm)   Pulse 82   Temp 98.3 F (36.8 C) (Oral)   Resp 20   SpO2 100%  Gen:   Awake, no distress   Resp:  Normal effort  MSK:   Moves extremities without difficulty  Other:  Mild ttp of suprapubic region, generalized lowback paraspinous muscle ttp, no CVA tenderness throughout  Medical Decision Making  Medically screening exam initiated at 9:30 AM.  Appropriate orders placed.  Stephanie [name] was informed that the remainder of the evaluation will be completed by another provider, this initial triage assessment does not replace that evaluation, and the importance of remaining in the ED until their evaluation is complete.  Workup initiated   Anselmo Pickler, [place] 09/27/22 [id number]

## 2022-09-27 NOTE — ED Provider Notes (Incomplete)
[facility] DEPT Provider Note   CSN: [id number] Arrival date & time: 09/27/22  7681     History  Chief Complaint  Patient presents with   Flank Pain    Stephanie [name] is a 41 y.o. female.   Flank Pain Associated symptoms include abdominal pain.     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  The patient states that she has a history of partial liver resection she also has a history of sigmoid colectomy and end colostomy on 04/04/2020 with a liver biopsy that confirmed metastatic disease, status post partial liver resection.  She states that for the last 2 days she has had flulike symptoms.  She also states that for the past week she has had pain in her right upper quadrant.  She was concerned that there could be something wrong with her liver.  She states that she is status post cholecystectomy.  States that she was exposed to someone with the flu.  She endorses a fever to 101.5 this morning.  She has been using Motrin at home.  She endorses some mild dysuria, generalized bodyaches, generalized myalgias.  Home Medications Prior to Admission medications   Medication Sig Start Date End Date Taking? Authorizing Provider  acetaminophen (TYLENOL) 325 MG tablet Take 2 tablets (650 mg total) by mouth every 6 (six) hours as needed for mild pain, moderate pain, fever or headache (fever > 101). 05/31/21   [name], MD  AUROVELA 1.5/30 1.5-30 MG-MCG tablet TAKE ONE TABLET BY MOUTH DAILY 02/10/22   Griffin Basil, MD  capecitabine (XELODA) 500 MG tablet Take 3 tablets in morning and 2 tablets in evening, every 12 hours.  Take for 14 days on, then off 7 days. Repeat every 21 days. 08/15/22   [name], MD  HYDROcodone-acetaminophen (NORCO/VICODIN) 5-325 MG tablet Take 1 tablet by mouth every 6 (six) hours as needed  for severe pain. 08/13/22   [name], MD  ibuprofen (ADVIL) 600 MG tablet Take 1 tablet (600 mg total) by mouth every 6 (six) hours as needed for headache, mild pain, moderate pain or cramping. 10/08/21   Griffin Basil, MD  Lactic Ac-Citric Ac-Pot Bitart (PHEXXI) 1.8-1-0.4 % GEL Place 5 g vaginally as needed. 08/05/21   Leftwich-Kirby, Kathie Dike, CNM  meloxicam (MOBIC) 7.5 MG tablet Take 1 tablet (7.5 mg total) by mouth daily. 04/28/21   Montine Circle, PA-C  metroNIDAZOLE (FLAGYL) 500 MG tablet Take 1 tablet (500 mg total) by mouth 2 (two) times daily. 07/09/22   [name], MD  ondansetron (ZOFRAN) 8 MG tablet TAKE 1 TABLET(8 MG) BY MOUTH EVERY 8 HOURS AS NEEDED FOR NAUSEA OR VOMITING 05/23/22   [name], MD  Prenatal Vit-Fe Fumarate-FA (PRENATAL MULTIVITAMIN) TABS tablet Take 1 tablet by mouth daily at 12 noon.    [provider]  prochlorperazine (COMPAZINE) 10 MG tablet Take 1 tablet (10 mg total) by mouth every 6 (six) hours as needed (Nausea or vomiting). 05/23/22   [name], MD  sulfamethoxazole-trimethoprim (BACTRIM DS) 800-160 MG tablet Take 1 tablet by mouth 2 (two) times daily. 12/12/21   [provider]  SUTAB [id number] MG TABS Take by mouth. 11/27/21   [provider]  topiramate (TOPAMAX) 50 MG tablet Take 50 mg by mouth daily. 01/27/22   [provider]  traMADol (ULTRAM) 50 MG tablet Take 1 tablet (50 mg total)  by mouth every 6 (six) hours as needed. 08/01/22   [name], MD  urea (CARMOL) 10 % cream Apply topically 2 (two) times daily. 05/01/22   [name], MD      Allergies    Oxaliplatin    Review of Systems   Review of Systems  Gastrointestinal:  Positive for abdominal pain.  Genitourinary:  Positive for flank pain.  All other systems reviewed and are negative.   Physical Exam Updated Vital Signs BP 103/67 (BP Location: Right Arm)   Pulse 74   Temp 98.3 F (36.8 C) (Oral)   Resp 18   SpO2 98%  Physical Exam Vitals and nursing note  reviewed.  Constitutional:      General: She is not in acute distress.    Appearance: She is well-developed.  HENT:     Head: Normocephalic and atraumatic.  Eyes:     Conjunctiva/sclera: Conjunctivae normal.  Cardiovascular:     Rate and Rhythm: Normal rate and regular rhythm.  Pulmonary:     Effort: Pulmonary effort is normal. No respiratory distress.     Breath sounds: Normal breath sounds.  Abdominal:     Palpations: Abdomen is soft.     Tenderness: There is abdominal tenderness.     Comments: Mild RUQ TTP  Musculoskeletal:        General: No swelling.     Cervical back: Neck supple.  Skin:    General: Skin is warm and dry.     Capillary Refill: Capillary refill takes less than 2 seconds.  Neurological:     Mental Status: She is alert.  Psychiatric:        Mood and Affect: Mood normal.     ED Results / Procedures / Treatments   Labs (all labs ordered are listed, but only abnormal results are displayed) Labs Reviewed  RESP PANEL BY RT-PCR (RSV, FLU A&B, COVID)  RVPGX2 - Abnormal; Notable for the following components:      Result Value   Influenza B by PCR POSITIVE (*)    All other components within normal limits  CBC WITH DIFFERENTIAL/PLATELET - Abnormal; Notable for the following components:   MCV 100.2 (*)    MCH 34.2 (*)    Platelets 82 (*)    Lymphs Abs 0.5 (*)    All other components within normal limits  COMPREHENSIVE METABOLIC PANEL - Abnormal; Notable for the following components:   CO2 21 (*)    Calcium 8.8 (*)    All other components within normal limits  URINALYSIS, ROUTINE W REFLEX MICROSCOPIC - Abnormal; Notable for the following components:   Color, Urine COLORLESS (*)    Specific Gravity, Urine 1.003 (*)    All other components within normal limits  URINE CULTURE  CULTURE, BLOOD (ROUTINE X 2)  CULTURE, BLOOD (ROUTINE X 2)  LIPASE, BLOOD  LACTIC ACID, PLASMA  LACTIC ACID, PLASMA    EKG None  Radiology No results  found.  Procedures Procedures    Medications Ordered in ED Medications - No data to display  ED Course/ Medical Decision Making/ A&P                           Medical Decision Making Amount and/or Complexity of Data Reviewed Radiology: ordered.  Risk Prescription drug management.    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  The  patient states that she has a history of partial liver resection she also has a history of sigmoid colectomy and end colostomy on 04/04/2020 with a liver biopsy that confirmed metastatic disease, status post partial liver resection.  She states that for the last 2 days she has had flulike symptoms.  She also states that for the past week she has had pain in her right upper quadrant.  She was concerned that there could be something wrong with her liver.  She states that she is status post cholecystectomy.  States that she was exposed to someone with the flu.  She endorses a fever to 101.5 this morning.  She has been using Motrin at home.  She endorses some mild dysuria, generalized bodyaches, generalized myalgias.  ***  Pt had an episode of hemoptysis in the ED. She has influenza, given her recent cancer hx, cannot rule out PE. Plan for CTA PE.   CT Results: IMPRESSION: 1. No evidence of pulmonary embolism. 2. Mild diffuse esophageal wall thickening, suggestive of esophagitis. 3. Possible wall thickening versus underdistention of the proximal duodenum, correlate for symptoms of duodenitis. 4. Thinly septated 5.7 cm cystic structure in the left adnexa has increased in size since November but did not demonstrate suspicious features on ultrasound and was without abnormal FDG avidity identified on PET-CT August 13, 2022. While the appearance on prior ultrasound and PET-CT are somewhat reassuring, given  the increase in size since prior studies and patient's history of right ovarian metastases this warrants further evaluation with nonemergent pelvic ultrasound. 5. Similar postoperative changes of prior posterior segment VI/VII partial hepatectomy and partial sigmoidectomy with rectal colonic anastomotic sutures without evidence of local recurrence.     {Document critical care time when appropriate:1} {Document review of labs and clinical decision tools ie heart score, Chads2Vasc2 etc:1}  {Document your independent review of radiology images, and any outside records:1} {Document your discussion with family members, caretakers, and with consultants:1} {Document social determinants of health affecting pt's care:1} {Document your decision making why or why not admission, treatments were needed:1} Final Clinical Impression(s) / ED Diagnoses Final diagnoses:  None    Rx / DC Orders ED Discharge Orders     None

## 2022-09-27 NOTE — ED Notes (Signed)
Patient discharged by MD.  RN not present at time of d/c.  Unable to obtain vital signs at this time

## 2022-09-28 LAB — URINE CULTURE: Culture: NO GROWTH

## 2022-09-30 ENCOUNTER — Other Ambulatory Visit: Payer: BC Managed Care – PPO

## 2022-09-30 ENCOUNTER — Ambulatory Visit: Payer: BC Managed Care – PPO | Admitting: Hematology

## 2022-10-01 NOTE — Assessment & Plan Note (Deleted)
[id number] stage IV with oligo liver metastasis; MMR normal, KRAS (+), right ovarian metastasis in 01/2022  -diagnosed 03/2020 by urgent colectomy for abdominal abscess. Liver metastasis confirmed by biopsy 04/16/20. -Foundation One showed K-ras mutation, she is not a candidate for EGFR inhibitor -s/p FOLFOXIRI/bevacizumab  05/23/20 - 10/24/20. 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 -s/p liver resection on 12/13/20 under Dr. Barry Dienes.  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 -CAPEOX started 03/28/22, oxali discontinued after 05/23/22 due to recurrent infusion reactions. -she completed single agent Xeloda in mid Dec 2023  -Will continue monitor closely with CT every 3 months due to her high risk of recurrence  -I have reviewed her recent ED visit, CT scan showed enlarging left ovarian cyst, which was not hypermetabolic on PET scan in November 2023.  I previously referred her to GYN Dr. Berline Lopes, I will follow-up on her appointment.

## 2022-10-02 ENCOUNTER — Inpatient Hospital Stay: Payer: Medicare Other | Admitting: Hematology

## 2022-10-02 ENCOUNTER — Inpatient Hospital Stay: Payer: Medicare Other

## 2022-10-02 DIAGNOSIS — C19 Malignant neoplasm of rectosigmoid junction: Secondary | ICD-10-CM

## 2022-10-02 LAB — CULTURE, BLOOD (ROUTINE X 2)
Culture: NO GROWTH
Special Requests: ADEQUATE

## 2022-10-16 NOTE — Progress Notes (Addendum)
[place]   [phone] [phone]   Clinic Follow up Note   Patient Care Team: Patient, [name] as PCP - General ([place]) [name], MD as Consulting Physician (Oncology) [name], MD as Consulting Physician (General Surgery) [name], NP as Nurse Practitioner (Nurse Practitioner) Griffin Basil, MD as Consulting Physician (Obstetrics and Gynecology) Carlyon Shadow, MD as Consulting Physician (Obstetrics and Gynecology)  Date of Service:  10/17/2022  CHIEF COMPLAINT: f/u of metastatic colon cancer   CURRENT THERAPY:  -Xeloda, q21d, starting 03/28/22, currently '1500mg'$  am and '1000mg'$  pm day 1-14 from 05/19/22   ASSESSMENT:  Stephanie [name] is a 42 y.o. 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 Liver metastasis confirmed by biopsy 04/16/20. -Foundation One showed K-ras mutation, she is not a candidate for EGFR inhibitor -s/p FOLFOXIRI/bevacizumab  05/23/20 - 10/24/20. 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 -s/p liver resection on 12/13/20 under Dr. Barry Dienes.  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 -CAPEOX started 03/28/22, oxali discontinued after 05/23/22 due to recurrent infusion reaction. -she continues on Xeloda with good tolerance. -restaging CT CAP 07/30/22 showed: 3.4 cm lesion in left ovary, likely a cyst; otherwise, no evidence of new or progressive disease. -CEA has been WNL/stable since initially taken after colectomy. -further evaluation with pelvic US on 08/04/22 showed a 3 cm mass in left ovary, favored benign and likely a hemorrhagic cyst.  -PET scan on 08/13/22 showed a hypermetabolic focus in pelvis associated with right fundal region of uterus, without obvious CT  abnormality, likely representing a benign fibroid. No findings for local recurrence or metastatic disease. -she was referred to Dr. Berline Lopes for discussion and f/u recommendations but appointment has not been scheduled. She has been [facility], had pelvic ultrasound yesterday, and was told the left ovarian cyst is slightly smaller.  Dr. Mardelle Matte recommended continue monitoring.  However patient still not comfortable with the decision, would like to have a second opinion to see Dr. Berline Lopes in our cancer center.  I will arrange.    PLAN: -Will refer her to GYN oncologist Dr. Berline Lopes in our cancer center -Follow-up in 3 months with lab, CT chest abdomen pelvis a few days before -Lab and port flush in 6 weeks, will repeat a CEA and GuardantReveal    SUMMARY OF ONCOLOGIC HISTORY: Oncology History Overview Note  Cancer Staging Malignant neoplasm of rectosigmoid junction [facility]) Staging form: Colon and Rectum, AJCC 8th Edition - Pathologic stage from 04/04/2020: pT4a, pN1b, cM1 - Signed by [name], NP on 05/07/2020    Malignant neoplasm of rectosigmoid junction ([place])  11/10/2019 Imaging   MRI Abdomen  IMPRESSION: 1. Redemonstrated hypoenhancing lesion of the posterior liver dome, hepatic segment VII, reduced in size compared to prior examination, measuring 1.3 x 1.3 cm, previously 1.8 x 1.7 cm. Findings are consistent with treatment response of a biopsy proven metastasis. No other evidence of lymphadenopathy or metastatic disease within the abdomen or pelvis. 2. Unchanged mild splenomegaly, maximum coronal span 14.0 cm. 3. 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   03/12/2020 Initial Diagnosis   Malignant neoplasm of rectosigmoid junction ([name])   03/12/2020 Imaging   CT AP with contrast IMPRESSION: 1. Overall findings are highly concerning for colorectal carcinoma involving  the sigmoid colon with an associated perforation  and adjacent abscess and phlegmon formation as detailed above. Currently, no collection is amenable to percutaneous drainage given their small size and location. 2. New 2 cm mass in the right hepatic lobe concerning for metastatic disease to the liver until proven otherwise. 3. Enlarged regional lymph nodes as detailed above is concerning for nodal metastatic disease. 4. Large stool burden. 5. Prominent pelvic veins which can be seen in patients with pelvic congestion syndrome.   03/13/2020 Imaging   ABD US IMPRESSION: Approximately 2.1 x 2.4 x 2.0 cm lobular homogeneously echogenic lesion in the right hepatic dome corresponds with the abnormality seen on the prior CT scan. Sonographically, this appearance is highly suggestive of a benign hemangioma.   Recommend MRI of the abdomen with gadolinium contrast which may provide a noninvasive diagnosis of benign hemangioma.    03/13/2020 Imaging   MR ABD W/WO CONTRAST Hepatobiliary: Diffuse low signal intensity throughout the hepatic parenchyma on T2 weighted images, presumably a consequence of recent Feraheme injection. In segment 7 of the liver (axial image 8 of series 5) there is a 2.5 x 1.9 cm well-defined lesion which is slightly T2 hyperintense. This lesion appears hyperintense on pre gadolinium T1 weighted images (likely a consequence of Feraheme). Interpretation of enhancement within the lesion is compromised by presence of Feraheme. No other hepatic lesions are confidently identified on today's examination. No intra or extrahepatic biliary ductal dilatation. Gallbladder is normal in appearance.   03/31/2020 Imaging   CT AP W contrast IMPRESSION: 1. Previously noted sigmoid colon mass appears increased in size, and again appears to be associated with a focal contained perforation which crosses the midline and has fistulized into the left adnexal region where there is now what appears to be a large left tubo-ovarian abscess, as  detailed above. This is also associated with multiple enlarged lymph nodes in the pelvis measuring up to 1.2 cm in short axis and borderline enlarged retroperitoneal lymph nodes, concerning for metastatic disease. In addition, previously suspected metastatic lesion in segment 7 of the liver has enlarged. 2. Small volume of ascites. 3. Additional incidental findings, as above.   04/04/2020 Cancer Staging   Staging form: Colon and Rectum, AJCC 8th Edition - Pathologic stage from 04/04/2020: pT4a, pN1b, cM1 - Signed by [name], NP on 05/07/2020   04/04/2020 Procedure   Paracentesis, path showed no malignant cells (mixed acute and chronic inflammation present)   04/04/2020 Surgery   Open sigmoid colectomy and end colostomy by Dr. [name]   04/04/2020 Pathology Results   FINAL MICROSCOPIC DIAGNOSIS: A. COLON, RECTOSIGMOID, RESECTION: - Invasive moderately differentiated adenocarcinoma, 6 cm, involving rectosigmoid junction - Carcinoma invades into serosal surface with perforation and associated serositis - Radial resection margin is positive for carcinoma; proximal and distal margins are not involved - Lymphovascular invasion is present - Metastatic carcinoma to one of fifteen lymph nodes (1/15); one tumor deposit - See oncology table B. LYMPH NODES, MESENTERIC, RESECTION: - Metastatic adenocarcinoma to one of six lymph nodes (1/6) - One tumor deposit  Addendum to note 2 involved lymph nodes (of 21 examined nodes) pT4a,pN1b MMR-normal, preserved expression of MLH1, MSH2, MSH6, PMS2   04/12/2020 Procedure   PAC placement    04/13/2020 Imaging   CT chest without contrast IMPRESSION: Interval development of bilateral pleural effusions, left slightly greater than right, with resultant bibasilar atelectasis including subtotal collapse of the left lower lobe. No evidence of intrathoracic metastatic disease, though evaluation of the  collapsed parenchyma is limited. Hepatic metastasis  again demonstrated.     04/16/2020 Pathology Results   FINAL MICROSCOPIC DIAGNOSIS:  A. LIVER, RIGHT LOBE, BIOPSY:  - Adenocarcinoma.  COMMENT:  The morphology is compatible with the provided clinical history of colorectal carcinoma.    05/23/2020 - 10/24/2020 Chemotherapy   FOLFIRINOX q2weeks for 3-6 months starting 05/23/20. Bevacizumab-bvzr [name] added with C2. Oxaliplatin held with C11-12 due to reaction. (pt developed SOB, chest palpitation and abdominal discomfort shortly after oxaliplatin started). Completed on 10/24/20.   08/05/2020 Imaging   CT AP  IMPRESSION: 1. Postsurgical changes of distal colectomy with a left lower quadrant end ostomy. No evidence of obstruction or acute complication at this time. Excluded rectal pouch in the deep pelvis without acute complication or worrisome features. 2. Slight interval decrease in size of a hypoattenuating lesion posterior right lobe liver measuring 1.7 x 1.8 x 2 cm. This lesion has previously undergone ultrasound-guided biopsy with pathologic results demonstrating adenocarcinoma compatible with metastatic disease from patient's resected colorectal carcinoma. 3. Slight prominence of the parametrial vessels bilaterally, nonspecific though can be seen in the setting of pelvic congestion syndrome. 4. Mild splenomegaly.  No focal lesion.     11/12/2020 Imaging   CT Chest  IMPRESSION: 1. No evidence of metastatic disease in the chest. 2. 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   12/13/2020 Surgery   A. LIVER, RIGHT, PARTIAL HEPATECTOMY WITH GALLBLADDER:  - Metastatic colon carcinoma to the liver showing approximately 80%  necrosis  - Resection margin is 0.8 cm from carcinoma  - Uninvolved liver parenchyma with no specific histopathologic changes  - Gallbladder with no specific histopathologic changes    01/28/2021 Genetic Testing   Negative genetic testing:  No pathogenic variants  detected on the [facility] CustomNext-Cancer + RNAinsight panel. The report date is 01/28/2021.   The CustomNext-Cancer+RNAinsight panel offered by [facility] included sequencing and rearrangement analysis for the following 47 genes:  APC, ATM, AXIN2, BARD1, BMPR1A, BRCA1, BRCA2, BRIP1, CDH1, CDK4, CDKN2A, CHEK2, DICER1, EPCAM, GREM1, HOXB13, MEN1, MLH1, MSH2, MSH3, MSH6, MUTYH, NBN, NF1, NF2, NTHL1, PALB2, PMS2, POLD1, POLE, PTEN, RAD51C, RAD51D, RECQL, RET, SDHA, SDHAF2, SDHB, SDHC, SDHD, SMAD4, SMARCA4, STK11, TP53, TSC1, TSC2, and VHL.  RNA data is routinely analyzed for use in variant interpretation for all genes.   02/19/2021 Imaging   CT A/P w/o contrast  IMPRESSION: Slightly limited examination examination in absence of contrast administration. Status post partial right hepatectomy. Interval decrease in size in perihepatic fluid collection and resolution of right subdiaphragmatic fluid and gas. No new intra-abdominal fluid collections are identified.   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 Moderate stool throughout the colon without evidence of obstruction.   Fluid distension of the proximal duodenum to the level of the SMA hiatus which appears narrow. 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   05/05/2021 Imaging   CT AP  IMPRESSION: Postsurgical changes as described stable in appearance from the prior exam.   Changes suggestive of mild pelvic varices.   08/22/2021 Imaging   EXAM: CT ABDOMEN AND PELVIS WITH CONTRAST  IMPRESSION: 1. Extensive bowel content is identified throughout the colon consistent with constipation. 2. 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 3. Stable postoperative changes in the right lobe liver.   09/12/2021 Survivorship   SCP delivered by Regan Rakers  Kalman Shan, NP   10/05/2021 Imaging   EXAM: CT ABDOMEN AND PELVIS WITH CONTRAST  IMPRESSION: 1. Right ovarian/adnexal cyst, 5.2 cm. Because this lesion is not adequately characterized, prompt [place] is recommended for further evaluation. Note: This recommendation does not apply to premenarchal patients and to those with increased risk (genetic, family history, elevated tumor markers or other high-risk factors) of ovarian cancer. Reference: JACR 2020 Feb; 17(2):248-254 2. No other evidence of an acute abnormality within the abdomen or pelvis. 3. Moderate increase in the colonic and rectal stool burden. No bowel obstruction or inflammation. 4. No evidence of locally recurrent or metastatic colon carcinoma.   11/12/2021 Imaging   EXAM: CT CHEST WITHOUT CONTRAST  IMPRESSION: 1. No evidence of pulmonary metastatic disease. 2. Stable postoperative findings about the posterior right lobe of the liver.   03/28/2022 - 04/18/2022 Chemotherapy   Patient is on Treatment Plan : COLORECTAL Xelox (Capeox) q21d     05/23/2022 - 05/23/2022 Chemotherapy   Patient is on Treatment Plan : COLORECTAL CAPEOX (130/850) q21d x 8 cycles        INTERVAL HISTORY:  Stephanie [name] is here for a follow up of  metastatic colon cancer She was last seen by me on 08/15/2022 She presents to the clinic with her fianc.  They are getting married next months.  She is very excited.  Mendel Ryder is doing well overall, she did have flu and was evaluated in ED on September 27, 2022, she has recovered very well.  She denies any significant pain, abdominal discomfort, change of bowel habit, or other new symptoms.  She is back to work.    All other systems were reviewed with the patient and are negative.  MEDICAL HISTORY:  Past Medical History:  Diagnosis Date   Anxiety    Blood transfusion without reported diagnosis    Bowel obstruction (HCC)    BV (bacterial vaginosis)    Colon cancer ([place])    Colon Cancer 03-2020   Colon  polyps    Colostomy present ([place])    03-2020   Family history of bladder cancer    History of chemotherapy    ended 09-2020   Lactose intolerance 03/12/2020   Neuromuscular disorder (HCC)    neuropathy feet hands legs   UTI (lower urinary tract infection)     SURGICAL HISTORY: Past Surgical History:  Procedure Laterality Date   CESAREAN SECTION     CHOLECYSTECTOMY  12/13/2020   COLECTOMY  03/2020   COLONOSCOPY     COLOSTOMY TAKEDOWN N/A 05/30/2021   Procedure: LAPAROSCOPIC ASSISTED COLOSTOMY TAKEDOWN;  Surgeon: [name], MD;  Location: [facility];  Service: General;  Laterality: N/A;   CYSTOSCOPY WITH STENT PLACEMENT  04/04/2020   Procedure: CYSTOSCOPY WITH STENT PLACEMENT;  Surgeon: [name], MD;  Location: [facility];  Service: General;;   LAPAROSCOPIC LIVER ULTRASOUND N/A 12/13/2020   Procedure: INTRAOPERATIVE LIVER ULTRASOUND;  Surgeon: [name], MD;  Location: [facility];  Service: General;  Laterality: N/A;   LAPAROSCOPY N/A 12/13/2020   Procedure: LAPAROSCOPY DIAGNOSTIC;  Surgeon: [name], MD;  Location: [facility];  Service: General;  Laterality: N/A;   LAPAROTOMY N/A 04/04/2020   Procedure: EXPLORATORY LAPAROTOMY;  Surgeon: [name], MD;  Location: [facility];  Service: General;  Laterality: N/A;   OPEN PARTIAL HEPATECTOMY  N/A 12/13/2020   Procedure: OPEN PARTIAL HEPATECTOMY;  Surgeon: [name], MD;  Location: [facility];  Service: General;  Laterality: N/A;  ROOM 2 STARTING AT 09:30AM FOR 300 MIN  PORTACATH PLACEMENT Right 04/12/2020   Procedure: INSERTION PORT-A-CATH WITH ULTRASOUND;  Surgeon: Kinsinger, Arta Bruce, MD;  Location: [facility];  Service: General;  Laterality: Right;    I have reviewed the social history and family history with the patient and they are unchanged from previous note.  ALLERGIES:  is allergic to oxaliplatin.  MEDICATIONS:  Current Outpatient Medications  Medication Sig Dispense Refill   acetaminophen (TYLENOL) 325 MG tablet Take 2 tablets  (650 mg total) by mouth every 6 (six) hours as needed for mild pain, moderate pain, fever or headache (fever > 101).     AUROVELA 1.5/30 1.5-30 MG-MCG tablet TAKE ONE TABLET BY MOUTH DAILY 21 tablet 6   capecitabine (XELODA) 500 MG tablet Take 3 tablets in morning and 2 tablets in evening, every 12 hours.  Take for 14 days on, then off 7 days. Repeat every 21 days. 70 tablet 0   famotidine (PEPCID) 20 MG tablet Take 1 tablet (20 mg total) by mouth 2 (two) times daily. 30 tablet 0   HYDROcodone-acetaminophen (NORCO/VICODIN) 5-325 MG tablet Take 1 tablet by mouth every 6 (six) hours as needed for severe pain. 10 tablet 0   ibuprofen (ADVIL) 600 MG tablet Take 1 tablet (600 mg total) by mouth every 6 (six) hours as needed for headache, mild pain, moderate pain or cramping. 30 tablet 2   Lactic Ac-Citric Ac-Pot Bitart (PHEXXI) 1.8-1-0.4 % GEL Place 5 g vaginally as needed. 60 g 1   meloxicam (MOBIC) 7.5 MG tablet Take 1 tablet (7.5 mg total) by mouth daily. 20 tablet 0   metroNIDAZOLE (FLAGYL) 500 MG tablet Take 1 tablet (500 mg total) by mouth 2 (two) times daily. 14 tablet 0   ondansetron (ZOFRAN) 8 MG tablet TAKE 1 TABLET(8 MG) BY MOUTH EVERY 8 HOURS AS NEEDED FOR NAUSEA OR VOMITING 30 tablet 2   oseltamivir (TAMIFLU) 75 MG capsule Take 1 capsule (75 mg total) by mouth every 12 (twelve) hours. 10 capsule 0   pantoprazole (PROTONIX) 40 MG tablet Take 1 tablet (40 mg total) by mouth daily. 30 tablet 0   Prenatal Vit-Fe Fumarate-FA (PRENATAL MULTIVITAMIN) TABS tablet Take 1 tablet by mouth daily at 12 noon.     prochlorperazine (COMPAZINE) 10 MG tablet Take 1 tablet (10 mg total) by mouth every 6 (six) hours as needed (Nausea or vomiting). 30 tablet 1   sulfamethoxazole-trimethoprim (BACTRIM DS) 800-160 MG tablet Take 1 tablet by mouth 2 (two) times daily.     SUTAB [phone] MG TABS Take by mouth.     topiramate (TOPAMAX) 50 MG tablet Take 50 mg by mouth daily.     traMADol (ULTRAM) 50 MG tablet  Take 1 tablet (50 mg total) by mouth every 6 (six) hours as needed. 30 tablet 0   urea (CARMOL) 10 % cream Apply topically 2 (two) times daily. 71 g 0   No current facility-administered medications for this visit.    PHYSICAL EXAMINATION: ECOG PERFORMANCE STATUS: 0 - Asymptomatic  Vitals:   10/17/22 0825  BP: 103/68  Pulse: 66  Resp: 15  Temp: 97.9 F (36.6 C)  SpO2: 99%   Wt Readings from Last 3 Encounters:  10/17/22 166 lb 3.2 oz (75.4 kg)  08/15/22 154 lb 14.4 oz (70.3 kg)  08/01/22 154 lb 12.8 oz (70.2 kg)     GENERAL:alert, no distress and comfortable SKIN: skin color, texture, turgor are normal, no rashes or significant lesions EYES: normal, Conjunctiva are pink and non-injected, sclera clear NECK: supple,  thyroid normal size, non-tender, without nodularity LYMPH:  no palpable lymphadenopathy in the cervical, axillary  LUNGS: clear to auscultation and percussion with normal breathing effort HEART: regular rate & rhythm and no murmurs and no lower extremity edema ABDOMEN:abdomen soft, non-tender and normal bowel sounds Musculoskeletal:no cyanosis of digits and no clubbing  NEURO: alert & oriented x 3 with fluent speech, no focal motor/sensory deficits  LABORATORY DATA:  I have reviewed the data as listed    Latest Ref Rng & Units 10/17/2022    8:04 AM 09/27/2022    9:30 AM 07/30/2022    1:51 PM  CBC  WBC 4.0 - 10.5 K/uL 4.7  4.0  5.5   Hemoglobin 12.0 - 15.0 g/dL 13.3  13.9  14.0   Hematocrit 36.0 - 46.0 % 38.4  40.8  39.5   Platelets 150 - 400 K/uL 141  82  124         Latest Ref Rng & Units 10/17/2022    8:04 AM 09/27/2022    9:30 AM 07/30/2022    1:51 PM  CMP  Glucose 70 - 99 mg/dL 90  89  90   BUN 6 - 20 mg/dL '12  12  11   '$ Creatinine 0.44 - 1.00 mg/dL 0.58  0.51  0.60   Sodium 135 - 145 mmol/L 139  137  140   Potassium 3.5 - 5.1 mmol/L 3.9  3.9  3.6   Chloride 98 - 111 mmol/L 106  111  109   CO2 22 - 32 mmol/L '29  21  25   '$ Calcium 8.9 - 10.3 mg/dL  8.6  8.8  9.0   Total Protein 6.5 - 8.1 g/dL 5.9  6.5  6.6   Total Bilirubin 0.3 - 1.2 mg/dL 0.3  0.3  0.8   Alkaline Phos 38 - 126 U/L 65  59  47   AST 15 - 41 U/L '16  17  21   '$ ALT 0 - 44 U/L '18  18  19       '$ RADIOGRAPHIC STUDIES: I have personally reviewed the radiological images as listed and agreed with the findings in the report. No results found.    Orders Placed This Encounter  Procedures   CT CHEST ABDOMEN PELVIS W CONTRAST    Standing Status:   Future    Standing Expiration Date:   10/18/2023    Order Specific Question:   Is patient pregnant?    Answer:   No    Order Specific Question:   Preferred imaging location?    Answer:   [facility]    Order Specific Question:   Is Oral Contrast requested for this exam?    Answer:   Yes, Per Radiology protocol   CEA (IN HOUSE-[facility])    Standing Status:   Standing    Number of Occurrences:   5    Standing Expiration Date:   10/18/2023   All questions were answered. The patient knows to call the clinic with any problems, questions or concerns. No barriers to learning was detected. The total time spent in the appointment was 30 minutes.     [name], MD 10/17/2022   Felicity Coyer, CMA, am acting as scribe for [name], MD.   I have reviewed the above documentation for accuracy and completeness, and I agree with the above.

## 2022-10-17 ENCOUNTER — Inpatient Hospital Stay: Payer: Medicare Other | Attending: Nurse Practitioner

## 2022-10-17 ENCOUNTER — Inpatient Hospital Stay (HOSPITAL_BASED_OUTPATIENT_CLINIC_OR_DEPARTMENT_OTHER): Payer: Medicare Other | Admitting: Hematology

## 2022-10-17 ENCOUNTER — Encounter: Payer: Self-pay | Admitting: Hematology

## 2022-10-17 ENCOUNTER — Other Ambulatory Visit: Payer: Self-pay

## 2022-10-17 VITALS — BP 103/68 | HR 66 | Temp 97.9°F | Resp 15 | Ht 68.0 in | Wt 166.2 lb

## 2022-10-17 DIAGNOSIS — C7961 Secondary malignant neoplasm of right ovary: Secondary | ICD-10-CM | POA: Insufficient documentation

## 2022-10-17 DIAGNOSIS — C19 Malignant neoplasm of rectosigmoid junction: Secondary | ICD-10-CM | POA: Diagnosis not present

## 2022-10-17 DIAGNOSIS — C787 Secondary malignant neoplasm of liver and intrahepatic bile duct: Secondary | ICD-10-CM | POA: Insufficient documentation

## 2022-10-17 DIAGNOSIS — Z95828 Presence of other vascular implants and grafts: Secondary | ICD-10-CM

## 2022-10-17 LAB — CBC WITH DIFFERENTIAL (CANCER CENTER ONLY)
Abs Immature Granulocytes: 0.01 10*3/uL (ref 0.00–0.07)
Basophils Absolute: 0 10*3/uL (ref 0.0–0.1)
Basophils Relative: 0 %
Eosinophils Absolute: 0.1 10*3/uL (ref 0.0–0.5)
Eosinophils Relative: 2 %
HCT: 38.4 % (ref 36.0–46.0)
Hemoglobin: 13.3 g/dL (ref 12.0–15.0)
Immature Granulocytes: 0 %
Lymphocytes Relative: 23 %
Lymphs Abs: 1.1 10*3/uL (ref 0.7–4.0)
MCH: 33.9 pg (ref 26.0–34.0)
MCHC: 34.6 g/dL (ref 30.0–36.0)
MCV: 98 fL (ref 80.0–100.0)
Monocytes Absolute: 0.5 10*3/uL (ref 0.1–1.0)
Monocytes Relative: 11 %
Neutro Abs: 2.9 10*3/uL (ref 1.7–7.7)
Neutrophils Relative %: 64 %
Platelet Count: 141 10*3/uL — ABNORMAL LOW (ref 150–400)
RBC: 3.92 MIL/uL (ref 3.87–5.11)
RDW: 12.8 % (ref 11.5–15.5)
WBC Count: 4.7 10*3/uL (ref 4.0–10.5)
nRBC: 0 % (ref 0.0–0.2)

## 2022-10-17 LAB — CMP (CANCER CENTER ONLY)
ALT: 18 U/L (ref 0–44)
AST: 16 U/L (ref 15–41)
Albumin: 3.5 g/dL (ref 3.5–5.0)
Alkaline Phosphatase: 65 U/L (ref 38–126)
Anion gap: 4 — ABNORMAL LOW (ref 5–15)
BUN: 12 mg/dL (ref 6–20)
CO2: 29 mmol/L (ref 22–32)
Calcium: 8.6 mg/dL — ABNORMAL LOW (ref 8.9–10.3)
Chloride: 106 mmol/L (ref 98–111)
Creatinine: 0.58 mg/dL (ref 0.44–1.00)
GFR, Estimated: >60 mL/min (ref >60)
Glucose, Bld: 90 mg/dL (ref 70–99)
Potassium: 3.9 mmol/L (ref 3.5–5.1)
Sodium: 139 mmol/L (ref 135–145)
Total Bilirubin: 0.3 mg/dL (ref 0.3–1.2)
Total Protein: 5.9 g/dL — ABNORMAL LOW (ref 6.5–8.1)

## 2022-10-17 MED ORDER — SODIUM CHLORIDE 0.9% FLUSH
10.0000 mL | Freq: Once | INTRAVENOUS | Status: AC
  Administered 2022-10-17: 10 mL

## 2022-10-17 MED ORDER — HEPARIN SOD (PORK) LOCK FLUSH 100 UNIT/ML IV SOLN
500.0000 [IU] | Freq: Once | INTRAVENOUS | Status: AC
  Administered 2022-10-17: 500 [IU]

## 2022-10-17 NOTE — Assessment & Plan Note (Signed)
grade 2, [id number] stage IV with oligo liver metastasis; MMR normal, KRAS (+), right ovarian metastasis in 01/2022  -diagnosed 03/2020 by urgent colectomy for abdominal abscess. Liver metastasis confirmed by biopsy 04/16/20. -Foundation One showed K-ras mutation, she is not a candidate for EGFR inhibitor -s/p FOLFOXIRI/bevacizumab  05/23/20 - 10/24/20. 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 -s/p liver resection on 12/13/20 under Dr. Barry Dienes.  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 -CAPEOX started 03/28/22, oxali discontinued after 05/23/22 due to recurrent infusion reaction. -she continues on Xeloda with good tolerance. -restaging CT CAP 07/30/22 showed: 3.4 cm lesion in left ovary, likely a cyst; otherwise, no evidence of new or progressive disease. -CEA has been WNL/stable since initially taken after colectomy. -further evaluation with pelvic US on 08/04/22 showed a 3 cm mass in left ovary, favored benign and likely a hemorrhagic cyst.  -PET scan on 08/13/22 showed a hypermetabolic focus in pelvis associated with right fundal region of uterus, without obvious CT abnormality, likely representing a benign fibroid. No findings for local recurrence or metastatic disease. -she was referred to Dr. Berline Lopes for discussion and f/u recommendations but appointment has not been scheduled.

## 2022-10-18 ENCOUNTER — Other Ambulatory Visit: Payer: Self-pay

## 2022-10-20 ENCOUNTER — Telehealth: Payer: Self-pay | Admitting: Hematology

## 2022-10-20 NOTE — Telephone Encounter (Signed)
Spoke with patient confirming upcoming appointment 

## 2022-10-28 ENCOUNTER — Telehealth: Payer: Self-pay | Admitting: *Deleted

## 2022-10-28 NOTE — Telephone Encounter (Signed)
Spoke with the patient regarding the referral to GYN oncology. Patient scheduled as new patient with Dr Berline Lopes on 2/16 at 9 am. Patient given an arrival time of 8:30 am.  Explained to the patient the the doctor will perform a pelvic exam at this visit. Patient given the policy that no visitors under the 16 yrs are allowed in the [facility]. Patient given the address/phone number for the clinic and that the center offers free valet service. Patient aware of the new mask mandate.

## 2022-11-11 ENCOUNTER — Encounter: Payer: Self-pay | Admitting: Gynecologic Oncology

## 2022-11-12 NOTE — Progress Notes (Addendum)
GYNECOLOGIC ONCOLOGY NEW PATIENT CONSULTATION   Patient Name: Stephanie [name]  Patient Age: 42 y.o. Date of Service: 11/14/22 Referring Provider: [name], MD [place],  [place] [address]   Primary Care Provider: Patient, [name] Consulting Provider: Jeral Pinch, MD   Assessment/Plan:  Premenopausal patient with history of colon cancer, most recently with recurrence to the right ovary, presenting in the setting of a complex left adnexal mass.  I spent some time discussing with the patient and her husband recent imaging findings.  We looked at PET scan and most recent CT scan together.  I am unable to see the ultrasound that she had with her OB/GYN at the end of January.  Overall, based on pelvic ultrasound, CT scan, and PET scan in our system, her left adnexal mass has overall reassuring features.  It is mostly cystic in appearance and may have some septations versus having multiple adjacent cysts.  I do not see features, such as mural nodules, solid component, or excrescences that would increase the suspicion for malignancy.  We discussed her recent CEA, which remains normal.  This does not seem to have been a tumor marker for her at her original diagnosis.  It also was not elevated when she had recurrence in her right ovary.  I reviewed findings on imaging at the time of her right adnexal mass.  She was symptomatic at that time, which helped prompt her surgery.  Additionally, there was a solid component of the adnexal mass at that time that remained even after the cystic component had resolved.  We discussed several treatment options moving forward.  The first would be to proceed with continued surveillance with follow-up ultrasound.  While this could be done with her OB/GYN, because I am unable to see these images, I recommended that we get an ultrasound here within the [facility] health system.  The second would be that in addition to getting follow-up imaging, we  consider starting a low-dose oral progesterone only pill.  Progesterone only pills can inhibit ovulation and a good percentage of patients.  If cyst that have been seen on last few imaging studies are related to functional cyst from ovulation, if we could stop ovulation, we would hopefully prevent additional formation of such cystic lesions.  Lastly, we discussed the option of surgery which would entail removing the left adnexa.  We discussed her prior surgical history and known adhesive disease, which increases surgical morbidity some.  After our discussion, the patient would like to move forward with starting a progesterone only pill and scheduling follow-up imaging.  We will plan on a repeat ultrasound in 6 weeks.  I sent a prescription for norethindrone to her pharmacy and the patient was given a printed handout about this medication and side effects.  Will call her after her ultrasound to discuss findings and to next steps in management.  The patient's last Pap smear was abnormal.  She underwent colposcopy at the end of 2023 which would mean she is due to cotesting in late 2024.    A copy of this note was sent to the patient's referring provider.   70 minutes of total time was spent for this patient encounter, including preparation, face-to-face counseling with the patient and coordination of care, and documentation of the encounter.  Jeral Pinch, MD  Division of Gynecologic Oncology  Department of Obstetrics and Gynecology  [facility]  ___________________________________________  Chief Complaint: Chief Complaint  Patient presents with  Ovarian Cyst    History of Present Illness:  Stephanie [name] is a 42 y.o. y.o. female who is seen in consultation at the request of [name], MD for an evaluation of a complex adnexal mass.  The patient has a history of rectal cancer. She underwent colectomy initially (03/2020 with reversal colostomy in 05/2021)  followed by liver resection (for metastatic disease) in 11/2020 and right oophorectomy/bilateral salpingectomy (again for metastatic disease) in 01/2022. Most recently recied CAPEOX for her recurrence (oxaliplatin dropped in 04/2022). Finished treatment at the end of 2023.  Imaging history: 09/2021: 5.2 cm right adnexal cyst noted on CT scan without other acute abnormality.  Pelvic ultrasound recommended.  Complex right ovarian cyst noted with thickened septation and mural nodule, new since prior CT scan in November 2022.  Follow-up pelvic ultrasound versus MRI recommended. 11/19/2021: Ultrasound shows a 2.6 cm diameter heterogenous mass within the right ovary previously seen.  Previously seen large cystic component no longer identified. 01/20/2022: Pelvic ultrasound shows a 3.2 x 2.8 x 3.1 cm solid heterogenous mass again in the right ovary with some degree of peripheral blood flow.  Slight interval growth from prior exam.  1.9 cm simple left ovarian cyst noted. 01/2022: Robotic right oophorectomy, bilateral salpingectomy, D&C with Dr. Polly Cobia. Findings at the time of surgery included adhesive disease between the omentum and the anterior abdominal wall predominantly on the left side. Some scarring in the pelvis on the left pelvic sidewall and left ovary. Right ovary and fallopian tube sent for frozen pathologic evaluation. Solid nodule showed findings consistent with adenocarcinoma, favor metastatic colon cancer. No other peritoneal based disease identified. Pathology revealed benign endometrial tissue, ovary with G2 adenocarcinoma c/w metastatic colorectal adenocarcinoma.  03/12/2022: PET shows no FDG evidence of recurrence or metastatic disease. 05/13/2022: No evidence of new metastatic disease in C/A/P. 06/02/2022: Pelvic ultrasound was performed for abnormal uterine bleeding.  1.9 cm intramural fibroid along anterior uterine body. Endometrium is 3.6 mm and normal in appearance. Left ovary 3.2 x 1.9 x 2.4 cm  without masses. 07/30/2022: CT C/A/P reveals hypodense 3.4 cm lesion in the left ovary, likely a cyst but incompletely characterized. 08/04/22: Pelvic ultrasound shows 3 cm hypoechoic mass in left ovary, favored to be benign, likely hemorrhagic.  08/13/22: PET/CT shows hypermetabolic focus in the pelvis associated with right fundal region of the uterus without obvious CT abnormality. Small periumbilical abdominal wall hernia containing fat and part of the colon.  09/27/22: CT A/P shows thinly septated 5.7 cm cystic structure in the left adnexa but does not demonstrate suspicious features on ultrasound and was not FDG avid on PET/CT.  CEA in 07/2022 was normal (3.39) although does not appear to have ever been a tumor marker.  Today, the patient reports overall doing well.  For the last several days, she has had some pelvic and back pain.  She just finished her menses.  Notes that menses continue to be regular, last only several days, which is shortened since treatment.  She denies any intermenstrual bleeding.  She endorses normal bowel function.  She has a history of urinary tract infections, was treated for 2 in the last several months.  Has a prophylactic antibiotic that she can take although tries not to use this frequently.  She endorses a good appetite without nausea or emesis.  Has some intermittent bloating since her initial surgeries, denies any recent change in this.  GYN history is notable for Pap smear in 2023 that was LSIL, high risk HPV  other positive (16/18/45 negative).  She underwent colposcopy in November 2023 with overall normal findings.  She will need repeat cotesting in 1 year.  PAST MEDICAL HISTORY:  Past Medical History:  Diagnosis Date   Anxiety    Blood transfusion without reported diagnosis    Bowel obstruction (HCC)    BV (bacterial vaginosis)    Colon cancer ([place])    Colon Cancer 03-2020   Colon polyps    Colostomy present ([facility])    03-2020   Family history of bladder  cancer    History of chemotherapy    ended 09-2020   Lactose intolerance 03/12/2020   Neuromuscular disorder (HCC)    neuropathy feet hands legs   UTI (lower urinary tract infection)      PAST SURGICAL HISTORY:  Past Surgical History:  Procedure Laterality Date   CESAREAN SECTION     CHOLECYSTECTOMY  12/13/2020   COLECTOMY  03/2020   COLONOSCOPY     COLOSTOMY TAKEDOWN N/A 05/30/2021   Procedure: LAPAROSCOPIC ASSISTED COLOSTOMY TAKEDOWN;  Surgeon: [name], MD;  Location: [facility];  Service: General;  Laterality: N/A;   CYSTOSCOPY WITH STENT PLACEMENT  04/04/2020   Procedure: CYSTOSCOPY WITH STENT PLACEMENT;  Surgeon: [name], MD;  Location: [facility];  Service: General;;   LAPAROSCOPIC LIVER ULTRASOUND N/A 12/13/2020   Procedure: INTRAOPERATIVE LIVER ULTRASOUND;  Surgeon: [name], MD;  Location: [facility];  Service: General;  Laterality: N/A;   LAPAROSCOPY N/A 12/13/2020   Procedure: LAPAROSCOPY DIAGNOSTIC;  Surgeon: [name], MD;  Location: [facility];  Service: General;  Laterality: N/A;   LAPAROTOMY N/A 04/04/2020   Procedure: EXPLORATORY LAPAROTOMY;  Surgeon: [name], MD;  Location: [facility];  Service: General;  Laterality: N/A;   OPEN PARTIAL HEPATECTOMY  N/A 12/13/2020   Procedure: OPEN PARTIAL HEPATECTOMY;  Surgeon: [name], MD;  Location: [facility];  Service: General;  Laterality: N/A;  ROOM 2 STARTING AT 09:30AM FOR 300 MIN   PORTACATH PLACEMENT Right 04/12/2020   Procedure: INSERTION PORT-A-CATH WITH ULTRASOUND;  Surgeon: Kinsinger, Arta Bruce, MD;  Location: [facility];  Service: General;  Laterality: Right;    OB/GYN HISTORY:  OB History  Gravida Para Term Preterm AB Living  3 1 1   2 1  $ SAB IAB Ectopic Multiple Live Births  1 1          # Outcome Date GA Lbr Len/2nd Weight Sex Delivery Anes PTL Lv  3 SAB              Birth Comments: System Generated. Please review and update pregnancy details.  2 Term           1 IAB             No LMP recorded.  (Menstrual status: Oophorectomy).  Age at menarche: 63  Age at menopause: n/a Hx of HRT: n/a Hx of STDs: HPV Last pap: 01/2021 - NIML, HR HPV negative; pap smear in 2023 - LSIL, HR HPV+ (16/18/45 negative) History of abnormal pap smears: see above  SCREENING STUDIES:  Last mammogram: 2022  Last colonoscopy: 2022  MEDICATIONS: Outpatient Encounter Medications as of 11/14/2022  Medication Sig   acetaminophen (TYLENOL) 325 MG tablet Take 2 tablets (650 mg total) by mouth every 6 (six) hours as needed for mild pain, moderate pain, fever or headache (fever > 101).   HYDROcodone-acetaminophen (NORCO/VICODIN) 5-325 MG tablet Take 1 tablet by mouth every 6 (six) hours as needed for severe pain.   ibuprofen (ADVIL)  600 MG tablet Take 1 tablet (600 mg total) by mouth every 6 (six) hours as needed for headache, mild pain, moderate pain or cramping.   norethindrone (MICRONOR) 0.35 MG tablet Take 1 tablet (0.35 mg total) by mouth daily.   Prenatal Vit-Fe Fumarate-FA (PRENATAL MULTIVITAMIN) TABS tablet Take 1 tablet by mouth daily at 12 noon.   topiramate (TOPAMAX) 50 MG tablet Take 50 mg by mouth daily.   traMADol (ULTRAM) 50 MG tablet Take 1 tablet (50 mg total) by mouth every 6 (six) hours as needed.   famotidine (PEPCID) 20 MG tablet Take 1 tablet (20 mg total) by mouth 2 (two) times daily. (Patient not taking: Reported on 11/11/2022)   Lactic Ac-Citric Ac-Pot Bitart (PHEXXI) 1.8-1-0.4 % GEL Place 5 g vaginally as needed. (Patient not taking: Reported on 11/11/2022)   metroNIDAZOLE (FLAGYL) 500 MG tablet Take 1 tablet (500 mg total) by mouth 2 (two) times daily. (Patient not taking: Reported on 11/11/2022)   ondansetron (ZOFRAN) 8 MG tablet TAKE 1 TABLET(8 MG) BY MOUTH EVERY 8 HOURS AS NEEDED FOR NAUSEA OR VOMITING (Patient not taking: Reported on 11/11/2022)   pantoprazole (PROTONIX) 40 MG tablet Take 1 tablet (40 mg total) by mouth daily.   prochlorperazine (COMPAZINE) 10 MG tablet Take 1 tablet (10 mg  total) by mouth every 6 (six) hours as needed (Nausea or vomiting). (Patient not taking: Reported on 11/11/2022)   SUTAB [phone] MG TABS Take by mouth. (Patient not taking: Reported on 11/11/2022)   urea (CARMOL) 10 % cream Apply topically 2 (two) times daily. (Patient not taking: Reported on 11/11/2022)   [DISCONTINUED] AUROVELA 1.5/30 1.5-30 MG-MCG tablet TAKE ONE TABLET BY MOUTH DAILY (Patient not taking: Reported on 11/11/2022)   [DISCONTINUED] capecitabine (XELODA) 500 MG tablet Take 3 tablets in morning and 2 tablets in evening, every 12 hours.  Take for 14 days on, then off 7 days. Repeat every 21 days. (Patient not taking: Reported on 11/11/2022)   [DISCONTINUED] meloxicam (MOBIC) 7.5 MG tablet Take 1 tablet (7.5 mg total) by mouth daily. (Patient not taking: Reported on 11/11/2022)   [DISCONTINUED] oseltamivir (TAMIFLU) 75 MG capsule Take 1 capsule (75 mg total) by mouth every 12 (twelve) hours. (Patient not taking: Reported on 11/11/2022)   [DISCONTINUED] sulfamethoxazole-trimethoprim (BACTRIM DS) 800-160 MG tablet Take 1 tablet by mouth 2 (two) times daily. (Patient not taking: Reported on 11/11/2022)   No facility-administered encounter medications on file as of 11/14/2022.    ALLERGIES:  Allergies  Allergen Reactions   Oxaliplatin Shortness Of Breath     FAMILY HISTORY:  Family History  Problem Relation Age of Onset   Irritable bowel syndrome Mother    Heart disease Father    Stroke Father    Aneurysm Father    Irritable bowel syndrome Brother    Other Brother        gluten intolerance   Bladder Cancer Maternal Grandmother        dx 90s   Colon cancer Neg Hx    Esophageal cancer Neg Hx    Colon polyps Neg Hx    Stomach cancer Neg Hx    Rectal cancer Neg Hx    Breast cancer Neg Hx    Ovarian cancer Neg Hx    Endometrial cancer Neg Hx    Pancreatic cancer Neg Hx    Prostate cancer Neg Hx      SOCIAL HISTORY:  Social Connections: Moderately Isolated (05/09/2022)    Social Connection and Isolation Panel [NHANES]  Frequency of Communication with Friends and Family: More than three times a week    Frequency of Social Gatherings with Friends and Family: More than three times a week    Attends Religious Services: 1 to 4 times per year    Active Member of [employer] or Organizations: No    Attends Archivist Meetings: Never    Marital Status: Never married    REVIEW OF SYSTEMS:  + Intermittent urinary frequency, back pain, muscle pain/cramps, anxiety. Denies appetite changes, fevers, chills, fatigue, unexplained weight changes. Denies hearing loss, neck lumps or masses, mouth sores, ringing in ears or voice changes. Denies cough or wheezing.  Denies shortness of breath. Denies chest pain or palpitations. Denies leg swelling. Denies abdominal distention, pain, blood in stools, constipation, diarrhea, nausea, vomiting, or early satiety. Denies pain with intercourse, dysuria, hematuria or incontinence. Denies hot flashes, pelvic pain, vaginal bleeding or vaginal discharge.   Denies itching, rash, or wounds. Denies dizziness, headaches, numbness or seizures. Denies swollen lymph nodes or glands, denies easy bruising or bleeding. Denies depression, confusion, or decreased concentration.  Physical Exam:  Vital Signs for this encounter:  Blood pressure 120/68, pulse 61, temperature 97.7 F (36.5 C), temperature source Oral, resp. rate 14, height 5' 8.9" (1.75 m), weight 155 lb 4.8 oz (70.4 kg), SpO2 99 %. Body mass index is 23 kg/m. General: Alert, oriented, no acute distress.  HEENT: Normocephalic, atraumatic. Sclera anicteric.  Chest: Clear to auscultation bilaterally. No wheezes, rhonchi, or rales. Cardiovascular: Regular rate and rhythm, no murmurs, rubs, or gallops.  Abdomen: Normoactive bowel sounds. Soft, nondistended, nontender to palpation. No masses or hepatosplenomegaly appreciated. No palpable fluid wave.  Multiple well-healed abdominal  scars. Extremities: Grossly normal range of motion. Warm, well perfused. No edema bilaterally.  Skin: No rashes or lesions.  Lymphatics: No cervical, supraclavicular, or inguinal adenopathy.  GU:  Normal external female genitalia.  No lesions. No discharge or bleeding.             Bladder/urethra:  No lesions or masses, well supported bladder             Vagina: Well-rugated, no lesions noted.             Cervix: Normal appearing, no lesions.             Uterus: Small, mobile, no parametrial involvement or nodularity.             Adnexa: No masses appreciated.  Rectal: Deferred.  LABORATORY AND RADIOLOGIC DATA:  Outside medical records were reviewed to synthesize the above history, along with the history and physical obtained during the visit.   Lab Results  Component Value Date   WBC 4.7 10/17/2022   HGB 13.3 10/17/2022   HCT 38.4 10/17/2022   PLT 141 (L) 10/17/2022   GLUCOSE 90 10/17/2022   ALT 18 10/17/2022   AST 16 10/17/2022   NA 139 10/17/2022   K 3.9 10/17/2022   CL 106 10/17/2022   CREATININE 0.58 10/17/2022   BUN 12 10/17/2022   CO2 29 10/17/2022   INR 1.1 06/02/2022   HGBA1C 4.8 05/22/2021

## 2022-11-13 ENCOUNTER — Telehealth: Payer: Self-pay | Admitting: Hematology

## 2022-11-13 NOTE — Telephone Encounter (Signed)
Patient called to reschedule labs she has another appointment that day, rescheduled labs patient confirmed.

## 2022-11-14 ENCOUNTER — Other Ambulatory Visit (HOSPITAL_COMMUNITY): Payer: Self-pay

## 2022-11-14 ENCOUNTER — Inpatient Hospital Stay: Payer: Medicare Other | Attending: Nurse Practitioner | Admitting: Gynecologic Oncology

## 2022-11-14 ENCOUNTER — Encounter: Payer: Self-pay | Admitting: Gynecologic Oncology

## 2022-11-14 VITALS — BP 120/68 | HR 61 | Temp 97.7°F | Resp 14 | Ht 68.9 in | Wt 155.3 lb

## 2022-11-14 DIAGNOSIS — R350 Frequency of micturition: Secondary | ICD-10-CM | POA: Insufficient documentation

## 2022-11-14 DIAGNOSIS — K219 Gastro-esophageal reflux disease without esophagitis: Secondary | ICD-10-CM | POA: Insufficient documentation

## 2022-11-14 DIAGNOSIS — Z933 Colostomy status: Secondary | ICD-10-CM | POA: Insufficient documentation

## 2022-11-14 DIAGNOSIS — R87612 Low grade squamous intraepithelial lesion on cytologic smear of cervix (LGSIL): Secondary | ICD-10-CM | POA: Insufficient documentation

## 2022-11-14 DIAGNOSIS — D3912 Neoplasm of uncertain behavior of left ovary: Secondary | ICD-10-CM | POA: Diagnosis present

## 2022-11-14 DIAGNOSIS — Z8052 Family history of malignant neoplasm of bladder: Secondary | ICD-10-CM | POA: Diagnosis not present

## 2022-11-14 DIAGNOSIS — M549 Dorsalgia, unspecified: Secondary | ICD-10-CM | POA: Insufficient documentation

## 2022-11-14 DIAGNOSIS — F419 Anxiety disorder, unspecified: Secondary | ICD-10-CM | POA: Diagnosis not present

## 2022-11-14 DIAGNOSIS — C19 Malignant neoplasm of rectosigmoid junction: Secondary | ICD-10-CM | POA: Diagnosis present

## 2022-11-14 DIAGNOSIS — R109 Unspecified abdominal pain: Secondary | ICD-10-CM | POA: Diagnosis not present

## 2022-11-14 DIAGNOSIS — Z87440 Personal history of urinary (tract) infections: Secondary | ICD-10-CM | POA: Insufficient documentation

## 2022-11-14 DIAGNOSIS — R140 Abdominal distension (gaseous): Secondary | ICD-10-CM | POA: Insufficient documentation

## 2022-11-14 DIAGNOSIS — Z79899 Other long term (current) drug therapy: Secondary | ICD-10-CM | POA: Diagnosis not present

## 2022-11-14 DIAGNOSIS — Z9221 Personal history of antineoplastic chemotherapy: Secondary | ICD-10-CM | POA: Insufficient documentation

## 2022-11-14 DIAGNOSIS — Z79890 Hormone replacement therapy (postmenopausal): Secondary | ICD-10-CM | POA: Diagnosis not present

## 2022-11-14 DIAGNOSIS — G709 Myoneural disorder, unspecified: Secondary | ICD-10-CM | POA: Insufficient documentation

## 2022-11-14 DIAGNOSIS — N83202 Unspecified ovarian cyst, left side: Secondary | ICD-10-CM

## 2022-11-14 DIAGNOSIS — N838 Other noninflammatory disorders of ovary, fallopian tube and broad ligament: Secondary | ICD-10-CM

## 2022-11-14 MED ORDER — NORETHINDRONE 0.35 MG PO TABS: 1.0000 | ORAL_TABLET | Freq: Every day | ORAL | 11 refills | Status: DC

## 2022-11-14 NOTE — Patient Instructions (Addendum)
It was nice to meet you today.  Today, we discussed her recent imaging which shows a cyst or multiple cyst on your left ovary.  I agree with your OB/GYN that overall, most recent ultrasound is very reassuring.  This shows decrease in size of the left ovary with no findings that would raise significant concern for cancer (such as solid component to the mass).  In terms of treatment options, we discussed close imaging follow-up with an ultrasound in 4-6 weeks.  We also discussed the option of starting you on a progesterone only birth control pill to hopefully stop ovulation.  Please try to take this pill at the same time every day.  In this case, I would still recommend repeating an ultrasound in approximately 6 weeks.  We also discussed the option of moving forward with surgery. For now, we've decided to start progesterone only pills and repeat an ultrasound in 6 weeks.  I sent a prescription in for the progesterone only pill to your pharmacy.  Please call me if you notice any significant side effects once you start this.  I will call you once I get your upcoming ultrasound images.

## 2022-11-28 ENCOUNTER — Other Ambulatory Visit: Payer: BC Managed Care – PPO

## 2022-11-28 ENCOUNTER — Encounter: Payer: Self-pay | Admitting: Hematology

## 2022-12-01 ENCOUNTER — Other Ambulatory Visit: Payer: Self-pay

## 2022-12-01 ENCOUNTER — Inpatient Hospital Stay: Payer: Medicare Other | Attending: Nurse Practitioner

## 2022-12-01 DIAGNOSIS — C19 Malignant neoplasm of rectosigmoid junction: Secondary | ICD-10-CM

## 2022-12-01 DIAGNOSIS — D3912 Neoplasm of uncertain behavior of left ovary: Secondary | ICD-10-CM | POA: Insufficient documentation

## 2022-12-01 DIAGNOSIS — Z85048 Personal history of other malignant neoplasm of rectum, rectosigmoid junction, and anus: Secondary | ICD-10-CM | POA: Insufficient documentation

## 2022-12-01 DIAGNOSIS — Z87891 Personal history of nicotine dependence: Secondary | ICD-10-CM | POA: Diagnosis not present

## 2022-12-01 DIAGNOSIS — Z95828 Presence of other vascular implants and grafts: Secondary | ICD-10-CM

## 2022-12-01 LAB — CBC WITH DIFFERENTIAL (CANCER CENTER ONLY)
Abs Immature Granulocytes: 0 10*3/uL (ref 0.00–0.07)
Basophils Absolute: 0 10*3/uL (ref 0.0–0.1)
Basophils Relative: 1 %
Eosinophils Absolute: 0 10*3/uL (ref 0.0–0.5)
Eosinophils Relative: 1 %
HCT: 40.2 % (ref 36.0–46.0)
Hemoglobin: 13.9 g/dL (ref 12.0–15.0)
Immature Granulocytes: 0 %
Lymphocytes Relative: 20 %
Lymphs Abs: 0.9 10*3/uL (ref 0.7–4.0)
MCH: 32.6 pg (ref 26.0–34.0)
MCHC: 34.6 g/dL (ref 30.0–36.0)
MCV: 94.1 fL (ref 80.0–100.0)
Monocytes Absolute: 0.4 10*3/uL (ref 0.1–1.0)
Monocytes Relative: 10 %
Neutro Abs: 3 10*3/uL (ref 1.7–7.7)
Neutrophils Relative %: 68 %
Platelet Count: 118 10*3/uL — ABNORMAL LOW (ref 150–400)
RBC: 4.27 MIL/uL (ref 3.87–5.11)
RDW: 12.2 % (ref 11.5–15.5)
WBC Count: 4.3 10*3/uL (ref 4.0–10.5)
nRBC: 0 % (ref 0.0–0.2)

## 2022-12-01 LAB — CMP (CANCER CENTER ONLY)
ALT: 16 U/L (ref 0–44)
AST: 17 U/L (ref 15–41)
Albumin: 4.3 g/dL (ref 3.5–5.0)
Alkaline Phosphatase: 55 U/L (ref 38–126)
Anion gap: 5 (ref 5–15)
BUN: 14 mg/dL (ref 6–20)
CO2: 28 mmol/L (ref 22–32)
Calcium: 9.3 mg/dL (ref 8.9–10.3)
Chloride: 104 mmol/L (ref 98–111)
Creatinine: 0.65 mg/dL (ref 0.44–1.00)
GFR, Estimated: >60 mL/min (ref >60)
Glucose, Bld: 87 mg/dL (ref 70–99)
Potassium: 4.5 mmol/L (ref 3.5–5.1)
Sodium: 137 mmol/L (ref 135–145)
Total Bilirubin: 0.5 mg/dL (ref 0.3–1.2)
Total Protein: 6.9 g/dL (ref 6.5–8.1)

## 2022-12-01 LAB — CEA (IN HOUSE-CHCC): CEA (CHCC-In House): 3.41 ng/mL (ref 0.00–5.00)

## 2022-12-01 MED ORDER — HEPARIN SOD (PORK) LOCK FLUSH 100 UNIT/ML IV SOLN
500.0000 [IU] | Freq: Once | INTRAVENOUS | Status: AC
  Administered 2022-12-01: 500 [IU]

## 2022-12-01 MED ORDER — SODIUM CHLORIDE 0.9% FLUSH
10.0000 mL | Freq: Once | INTRAVENOUS | Status: AC
  Administered 2022-12-01: 10 mL

## 2022-12-02 ENCOUNTER — Encounter: Payer: Self-pay | Admitting: Hematology

## 2022-12-17 ENCOUNTER — Encounter: Payer: Self-pay | Admitting: Hematology

## 2022-12-18 ENCOUNTER — Telehealth: Payer: Self-pay | Admitting: *Deleted

## 2022-12-18 ENCOUNTER — Other Ambulatory Visit: Payer: Self-pay | Admitting: Gynecologic Oncology

## 2022-12-18 DIAGNOSIS — N838 Other noninflammatory disorders of ovary, fallopian tube and broad ligament: Secondary | ICD-10-CM

## 2022-12-18 NOTE — Telephone Encounter (Addendum)
Patient called and stated "I have been seeing Dr Berline Lopes and Dr Burr Medico. Last time I saw Dr Berline Lopes she started me on BCP. I am started the pills 2 weeks ago and this morning I am having bleeding. Break through bleeding. It started this morning and I am wearing a pad. The color red dark red with no clots. The pain is at a 8-9. I had lower back pain and abdominal pain for the past couple days before the bleeding. Yesterday I did have some chills and a low grade fever. (No temp given) Also my husband told me I looked pale. This feels like it did before when they found the tumor." Patient very tearful and scared on the phone.   Explained that the message would be given to Dr Sarina Ill APP and the office will call her back. Patient phone number and pharmacy verified. (Use [facility])

## 2022-12-18 NOTE — Telephone Encounter (Signed)
Sure thing, Stephanie [name] and I will wait for her to call the office back.

## 2022-12-18 NOTE — Telephone Encounter (Signed)
Called the patient. No answer LVM requesting callback. Offered that she can come in for blood work (I will order a CBC) - please reassure her that last CBC looked normal. I also offered that you Maudie Mercury or [place]) could try to get her [place] moved up. I would recommend that she double up on the progesterone, which will hopefully help with bleeding and cramping. Finally, if she'd like to move forward with plan to take out the ovary, please let Melissa know so that she can look for a date. Thank you

## 2022-12-19 ENCOUNTER — Inpatient Hospital Stay: Payer: Medicare Other

## 2022-12-19 ENCOUNTER — Inpatient Hospital Stay (HOSPITAL_BASED_OUTPATIENT_CLINIC_OR_DEPARTMENT_OTHER): Payer: Medicare Other | Admitting: Gynecologic Oncology

## 2022-12-19 ENCOUNTER — Encounter: Payer: Self-pay | Admitting: Hematology

## 2022-12-19 ENCOUNTER — Other Ambulatory Visit: Payer: Self-pay

## 2022-12-19 ENCOUNTER — Ambulatory Visit: Payer: BC Managed Care – PPO

## 2022-12-19 ENCOUNTER — Encounter: Payer: Self-pay | Admitting: Surgery

## 2022-12-19 ENCOUNTER — Encounter: Payer: Self-pay | Admitting: Gynecologic Oncology

## 2022-12-19 DIAGNOSIS — N939 Abnormal uterine and vaginal bleeding, unspecified: Secondary | ICD-10-CM | POA: Diagnosis not present

## 2022-12-19 DIAGNOSIS — D3912 Neoplasm of uncertain behavior of left ovary: Secondary | ICD-10-CM | POA: Diagnosis not present

## 2022-12-19 DIAGNOSIS — N838 Other noninflammatory disorders of ovary, fallopian tube and broad ligament: Secondary | ICD-10-CM

## 2022-12-19 DIAGNOSIS — C19 Malignant neoplasm of rectosigmoid junction: Secondary | ICD-10-CM

## 2022-12-19 LAB — CBC WITH DIFFERENTIAL (CANCER CENTER ONLY)
Abs Immature Granulocytes: 0 10*3/uL (ref 0.00–0.07)
Basophils Absolute: 0 10*3/uL (ref 0.0–0.1)
Basophils Relative: 1 %
Eosinophils Absolute: 0 10*3/uL (ref 0.0–0.5)
Eosinophils Relative: 1 %
HCT: 44 % (ref 36.0–46.0)
Hemoglobin: 15 g/dL (ref 12.0–15.0)
Immature Granulocytes: 0 %
Lymphocytes Relative: 25 %
Lymphs Abs: 1.3 10*3/uL (ref 0.7–4.0)
MCH: 31.9 pg (ref 26.0–34.0)
MCHC: 34.1 g/dL (ref 30.0–36.0)
MCV: 93.6 fL (ref 80.0–100.0)
Monocytes Absolute: 0.4 10*3/uL (ref 0.1–1.0)
Monocytes Relative: 9 %
Neutro Abs: 3.3 10*3/uL (ref 1.7–7.7)
Neutrophils Relative %: 64 %
Platelet Count: 122 10*3/uL — ABNORMAL LOW (ref 150–400)
RBC: 4.7 MIL/uL (ref 3.87–5.11)
RDW: 12.1 % (ref 11.5–15.5)
WBC Count: 5.1 10*3/uL (ref 4.0–10.5)
nRBC: 0 % (ref 0.0–0.2)

## 2022-12-19 LAB — CMP (CANCER CENTER ONLY)
ALT: 18 U/L (ref 0–44)
AST: 18 U/L (ref 15–41)
Albumin: 4.5 g/dL (ref 3.5–5.0)
Alkaline Phosphatase: 49 U/L (ref 38–126)
Anion gap: 5 (ref 5–15)
BUN: 15 mg/dL (ref 6–20)
CO2: 28 mmol/L (ref 22–32)
Calcium: 9.4 mg/dL (ref 8.9–10.3)
Chloride: 105 mmol/L (ref 98–111)
Creatinine: 0.71 mg/dL (ref 0.44–1.00)
GFR, Estimated: >60 mL/min (ref >60)
Glucose, Bld: 90 mg/dL (ref 70–99)
Potassium: 4.4 mmol/L (ref 3.5–5.1)
Sodium: 138 mmol/L (ref 135–145)
Total Bilirubin: 0.6 mg/dL (ref 0.3–1.2)
Total Protein: 7.1 g/dL (ref 6.5–8.1)

## 2022-12-19 MED ORDER — MEDROXYPROGESTERONE ACETATE 10 MG PO TABS: 10.0000 mg | ORAL_TABLET | Freq: Every day | ORAL | 1 refills | Status: DC

## 2022-12-19 NOTE — Telephone Encounter (Signed)
Patient called in stating she missed call from Dr Berline Lopes yesterday and was returning call. Advised patient Dr Berline Lopes recommends repeat CBC and double up on progesterone to help with bleeding and cramping. Patient stated she would like to come in for lab and have [place] moved up. Lab scheduled for today at noon, pt aware of appt. [place] moved to 3/23. Patient made aware in mychart. Patient stated that she thinks she will want to move forward with plan to have her ovary out but wants to discuss with Dr Berline Lopes first. Telephone visit scheduled for today. Patient advised to keep her phone on her to ensure she doesn't miss the call. Patient verbalized understanding and had no further concerns at this time.

## 2022-12-19 NOTE — Progress Notes (Signed)
Gynecologic Oncology Telehealth Note: Gyn-Onc  I connected with Stephanie [name] on 12/19/22 at  4:30 PM EDT by telephone and verified that I am speaking with the correct person using two identifiers.  I discussed the limitations, risks, security and privacy concerns of performing an evaluation and management service by telemedicine and the availability of in-person appointments. I also discussed with the patient that there may be a patient responsible charge related to this service. The patient expressed understanding and agreed to proceed.  Other persons participating in the visit and their role in the encounter: none.  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 Redemonstrated hypoenhancing lesion of the posterior liver dome, hepatic segment VII, reduced in size compared to prior examination, measuring 1.3 x 1.3 cm, previously 1.8 x 1.7 cm. Findings are consistent with treatment response of a biopsy proven metastasis. No other evidence of lymphadenopathy or metastatic disease within the abdomen or pelvis. 2. Unchanged mild splenomegaly, maximum coronal span 14.0 cm. 3. 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   03/12/2020 Initial Diagnosis   Malignant neoplasm of rectosigmoid junction ([place])   03/12/2020 Imaging   CT AP with contrast IMPRESSION: 1. Overall findings are highly concerning for colorectal carcinoma involving the sigmoid colon with an associated perforation and adjacent abscess and phlegmon formation as detailed above. Currently,  no collection is amenable to percutaneous drainage given their small size and location. 2. New 2 cm mass in the right hepatic lobe concerning for metastatic disease to the liver until proven otherwise. 3. Enlarged regional lymph nodes as detailed above is concerning for nodal metastatic disease. 4. Large stool burden. 5. Prominent pelvic veins which can be seen in patients with pelvic congestion syndrome.   03/13/2020 Imaging   ABD US IMPRESSION: Approximately 2.1 x 2.4 x 2.0 cm lobular homogeneously echogenic lesion in the right hepatic dome corresponds with the abnormality seen on the prior CT scan. Sonographically, this appearance is highly suggestive of a benign hemangioma.   Recommend MRI of the abdomen with gadolinium contrast which may provide a noninvasive diagnosis of benign hemangioma.    03/13/2020 Imaging   MR ABD W/WO CONTRAST Hepatobiliary: Diffuse low signal intensity throughout the hepatic parenchyma on T2 weighted images, presumably a consequence of recent Feraheme injection. In segment 7 of the liver (axial image 8 of series 5) there is a 2.5 x 1.9 cm well-defined lesion which is slightly T2 hyperintense. This lesion appears hyperintense on pre gadolinium T1 weighted images (likely a consequence of Feraheme). Interpretation of enhancement within the lesion is compromised by presence of Feraheme. No other hepatic lesions are confidently identified on today's examination. No intra or extrahepatic biliary ductal dilatation. Gallbladder is normal in appearance.   03/31/2020 Imaging   CT AP W contrast IMPRESSION: 1. Previously noted sigmoid colon mass appears increased in size, and again appears to be associated with a focal contained perforation which crosses the midline and has fistulized into the left adnexal region where there is now what appears to be a large left tubo-ovarian abscess, as detailed above. This is also associated with multiple enlarged lymph nodes  in the pelvis measuring up to 1.2 cm in short axis and borderline enlarged retroperitoneal lymph nodes, concerning for metastatic disease. In addition, previously suspected metastatic lesion in segment 7 of the liver has enlarged. 2. Small volume of ascites. 3. Additional incidental findings, as above.   04/04/2020 Cancer Staging   Staging form: Colon and Rectum, AJCC 8th Edition - Pathologic stage from 04/04/2020: pT4a, pN1b, cM1 - Signed by [name], NP on 05/07/2020   04/04/2020 Procedure   Paracentesis, path showed no malignant cells (mixed acute and chronic inflammation present)   04/04/2020 Surgery   Open sigmoid colectomy and end colostomy by Dr. [name]   04/04/2020 Pathology Results   FINAL MICROSCOPIC DIAGNOSIS: A. COLON, RECTOSIGMOID, RESECTION: - Invasive moderately differentiated adenocarcinoma, 6 cm, involving rectosigmoid junction - Carcinoma invades into serosal surface with perforation and associated serositis - Radial resection margin is positive for carcinoma; proximal and distal margins are not involved - Lymphovascular invasion is present - Metastatic carcinoma to one of fifteen lymph nodes (1/15); one tumor deposit - See oncology table B. LYMPH NODES, MESENTERIC, RESECTION: - Metastatic adenocarcinoma to one of six lymph nodes (1/6) - One tumor deposit  Addendum to note 2 involved lymph nodes (of 21 examined nodes) pT4a,pN1b MMR-normal, preserved expression of MLH1, MSH2, MSH6, PMS2   04/12/2020 Procedure   PAC placement    04/13/2020 Imaging   CT chest without contrast IMPRESSION: Interval development of bilateral pleural effusions, left slightly greater than right, with resultant bibasilar atelectasis including subtotal collapse of the left lower lobe. No evidence of intrathoracic metastatic disease, though evaluation of the collapsed parenchyma is limited. Hepatic metastasis again demonstrated.     04/16/2020 Pathology Results   FINAL MICROSCOPIC  DIAGNOSIS:  A. LIVER, RIGHT LOBE, BIOPSY:  - Adenocarcinoma.  COMMENT:  The morphology is compatible with the provided clinical history of colorectal carcinoma.    05/23/2020 - 10/24/2020 Chemotherapy   FOLFIRINOX q2weeks for 3-6 months starting 05/23/20. Bevacizumab-bvzr [name] added with C2. Oxaliplatin held with C11-12 due to reaction. (pt developed SOB, chest palpitation and abdominal discomfort shortly after oxaliplatin started). Completed on 10/24/20.   08/05/2020 Imaging   CT AP  IMPRESSION: 1. Postsurgical changes of distal colectomy with a left lower quadrant end ostomy. No evidence of obstruction or acute complication at this time. Excluded rectal pouch in the deep pelvis without acute complication or worrisome features. 2. Slight interval decrease in size of a hypoattenuating lesion posterior right lobe liver measuring 1.7 x 1.8 x 2 cm. This lesion has previously undergone ultrasound-guided biopsy with pathologic results demonstrating adenocarcinoma compatible with metastatic disease from patient's resected colorectal carcinoma. 3. Slight prominence of the parametrial vessels bilaterally, nonspecific though can be seen in the setting of pelvic congestion syndrome. 4. Mild splenomegaly.  No focal lesion.     11/12/2020 Imaging   CT Chest  IMPRESSION: 1. No evidence of metastatic disease in the chest. 2. 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   12/13/2020 Surgery   A. 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 The report date is  01/28/2021.   The  CustomNext-Cancer+RNAinsight panel offered by [employer] included sequencing and rearrangement analysis for the following 47 genes:  APC, ATM, AXIN2, BARD1, BMPR1A, BRCA1, BRCA2, BRIP1, CDH1, CDK4, CDKN2A, CHEK2, DICER1, EPCAM, GREM1, HOXB13, MEN1, MLH1, MSH2, MSH3, MSH6, MUTYH, NBN, NF1, NF2, NTHL1, PALB2, PMS2, POLD1, POLE, PTEN, RAD51C, RAD51D, RECQL, RET, SDHA, SDHAF2, SDHB, SDHC, SDHD, SMAD4, SMARCA4, STK11, TP53, TSC1, TSC2, and VHL.  RNA data is routinely analyzed for use in variant interpretation for all genes.   02/19/2021 Imaging   CT A/P w/o contrast  IMPRESSION: Slightly limited examination examination in absence of contrast administration. Status post partial right hepatectomy. Interval decrease in size in perihepatic fluid collection and resolution of right subdiaphragmatic fluid and gas. No new intra-abdominal fluid collections are identified.   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 Moderate stool throughout the colon without evidence of obstruction.   Fluid distension of the proximal duodenum to the level of the SMA hiatus which appears narrow. 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   05/05/2021 Imaging   CT AP  IMPRESSION: Postsurgical changes as described stable in appearance from the prior exam.   Changes suggestive of mild pelvic varices.   08/22/2021 Imaging   EXAM: CT ABDOMEN AND PELVIS WITH CONTRAST  IMPRESSION: 1. Extensive bowel content is identified throughout the colon consistent with constipation. 2. 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 3. Stable postoperative changes in the right lobe liver.   09/12/2021 Survivorship   SCP delivered by Cira Rue, NP   10/05/2021 Imaging   EXAM: CT ABDOMEN AND PELVIS WITH  CONTRAST  IMPRESSION: 1. Right ovarian/adnexal cyst, 5.2 cm. Because this lesion is not adequately characterized, prompt [place] is recommended for further evaluation. Note: This recommendation does not apply to premenarchal patients and to those with increased risk (genetic, family history, elevated tumor markers or other high-risk factors) of ovarian cancer. Reference: JACR 2020 Feb; 17(2):248-254 2. No other evidence of an acute abnormality within the abdomen or pelvis. 3. Moderate increase in the colonic and rectal stool burden. No bowel obstruction or inflammation. 4. No evidence of locally recurrent or metastatic colon carcinoma.   11/12/2021 Imaging   EXAM: CT CHEST WITHOUT CONTRAST  IMPRESSION: 1. No evidence of pulmonary metastatic disease. 2. Stable postoperative findings about the posterior right lobe of the liver.   03/28/2022 - 04/18/2022 Chemotherapy   Patient is on Treatment Plan : COLORECTAL Xelox (Capeox) q21d     05/23/2022 - 05/23/2022 Chemotherapy   Patient is on Treatment Plan : COLORECTAL CAPEOX (130/850) q21d x 8 cycles       Interval History: Yesterday didn't feel good. Chills, fatigue. Slept most of day. Looked pale according to husband. Started bleeding yesterday am, changing pads frequently, using pads and tampons, bleeding through clothes.  Feeling scared.  Feels similar yesterday to how she did at the time of her cancer recurrence last time.  Past Medical/Surgical History: Past Medical History:  Diagnosis Date   Anxiety    Blood transfusion without reported diagnosis    Bowel obstruction (HCC)    BV (bacterial vaginosis)    Colon cancer [facility])    Colon Cancer 03-2020   Colon polyps    Colostomy present [facility])    03-2020   Family history of bladder cancer    History of chemotherapy    ended 09-2020   Lactose intolerance 03/12/2020   Neuromuscular  disorder (HCC)    neuropathy feet hands legs   UTI (lower urinary tract infection)     Past Surgical  History:  Procedure Laterality Date   CESAREAN SECTION     CHOLECYSTECTOMY  12/13/2020   COLECTOMY  03/2020   COLONOSCOPY     COLOSTOMY TAKEDOWN N/A 05/30/2021   Procedure: LAPAROSCOPIC ASSISTED COLOSTOMY TAKEDOWN;  Surgeon: [name], MD;  Location: [place] OR;  Service: General;  Laterality: N/A;   CYSTOSCOPY WITH STENT PLACEMENT  04/04/2020   Procedure: CYSTOSCOPY WITH STENT PLACEMENT;  Surgeon: [name], MD;  Location: [facility];  Service: General;;   LAPAROSCOPIC LIVER ULTRASOUND N/A 12/13/2020   Procedure: INTRAOPERATIVE LIVER ULTRASOUND;  Surgeon: [name], MD;  Location: [place];  Service: General;  Laterality: N/A;   LAPAROSCOPY N/A 12/13/2020   Procedure: LAPAROSCOPY DIAGNOSTIC;  Surgeon: [name], MD;  Location: [place];  Service: General;  Laterality: N/A;   LAPAROTOMY N/A 04/04/2020   Procedure: EXPLORATORY LAPAROTOMY;  Surgeon: [name], MD;  Location: [facility];  Service: General;  Laterality: N/A;   OPEN PARTIAL HEPATECTOMY  N/A 12/13/2020   Procedure: OPEN PARTIAL HEPATECTOMY;  Surgeon: [name], MD;  Location: [place];  Service: General;  Laterality: N/A;  ROOM 2 STARTING AT 09:30AM FOR 300 MIN   PORTACATH PLACEMENT Right 04/12/2020   Procedure: INSERTION PORT-A-CATH WITH ULTRASOUND;  Surgeon: Kinsinger, Arta Bruce, MD;  Location: [facility];  Service: General;  Laterality: Right;    Family History  Problem Relation Age of Onset   Irritable bowel syndrome Mother    Heart disease Father    Stroke Father    Aneurysm Father    Irritable bowel syndrome Brother    Other Brother        gluten intolerance   Bladder Cancer Maternal Grandmother        dx 90s   Colon cancer Neg Hx    Esophageal cancer Neg Hx    Colon polyps Neg Hx    Stomach cancer Neg Hx    Rectal cancer Neg Hx    Breast cancer Neg Hx    Ovarian cancer Neg Hx    Endometrial cancer Neg Hx    Pancreatic cancer Neg Hx    Prostate cancer Neg Hx     Social History   Socioeconomic History    Marital status: Single    Spouse name: Not on file   Number of children: 1   Years of education: Not on file   Highest education level: Not on file  Occupational History   Occupation: hair stylist  Tobacco Use   Smoking status: Former    Packs/day: 0.50    Years: 10.00    Additional pack years: 0.00    Total pack years: 5.00    Types: Cigarettes    Quit date: 2018    Years since quitting: 6.2   Smokeless tobacco: Never  Vaping Use   Vaping Use: Never used  Substance and Sexual Activity   Alcohol use: Not Currently   Drug use: No   Sexual activity: Yes    Partners: Male    Birth control/protection: OCP  Other Topics Concern   Not on file  Social History Narrative   Not on file   Social Determinants of Health   Financial Resource Strain: High Risk (05/09/2022)   Overall Financial Resource Strain (CARDIA)    Difficulty of Paying Living Expenses: Very hard  Food Insecurity: Food Insecurity Present (05/09/2022)   Hunger Vital Sign  Worried About Charity fundraiser in the Last Year: Sometimes true    Ran Out of Food in the Last Year: Sometimes true  Transportation Needs: Unmet Transportation Needs (05/09/2022)   PRAPARE - Hydrologist (Medical): No    Lack of Transportation (Non-Medical): Yes  Physical Activity: Not on file  Stress: Stress Concern Present (05/09/2022)   [place]    Feeling of Stress : Very much  Social Connections: Moderately Isolated (05/09/2022)   Social Connection and Isolation Panel [NHANES]    Frequency of Communication with Friends and Family: More than three times a week    Frequency of Social Gatherings with Friends and Family: More than three times a week    Attends Religious Services: 1 to 4 times per year    Active Member of [employer] or Organizations: No    Attends Archivist Meetings: Never    Marital Status: Never married    Current  Medications:  Current Outpatient Medications:    acetaminophen (TYLENOL) 325 MG tablet, Take 2 tablets (650 mg total) by mouth every 6 (six) hours as needed for mild pain, moderate pain, fever or headache (fever > 101)., Disp: , Rfl:    famotidine (PEPCID) 20 MG tablet, Take 1 tablet (20 mg total) by mouth 2 (two) times daily. (Patient not taking: Reported on 11/11/2022), Disp: 30 tablet, Rfl: 0   HYDROcodone-acetaminophen (NORCO/VICODIN) 5-325 MG tablet, Take 1 tablet by mouth every 6 (six) hours as needed for severe pain., Disp: 10 tablet, Rfl: 0   ibuprofen (ADVIL) 600 MG tablet, Take 1 tablet (600 mg total) by mouth every 6 (six) hours as needed for headache, mild pain, moderate pain or cramping., Disp: 30 tablet, Rfl: 2   Lactic Ac-Citric Ac-Pot Bitart (PHEXXI) 1.8-1-0.4 % GEL, Place 5 g vaginally as needed. (Patient not taking: Reported on 11/11/2022), Disp: 60 g, Rfl: 1   metroNIDAZOLE (FLAGYL) 500 MG tablet, Take 1 tablet (500 mg total) by mouth 2 (two) times daily. (Patient not taking: Reported on 11/11/2022), Disp: 14 tablet, Rfl: 0   norethindrone (MICRONOR) 0.35 MG tablet, Take 1 tablet (0.35 mg total) by mouth daily., Disp: 28 tablet, Rfl: 11   ondansetron (ZOFRAN) 8 MG tablet, TAKE 1 TABLET(8 MG) BY MOUTH EVERY 8 HOURS AS NEEDED FOR NAUSEA OR VOMITING (Patient not taking: Reported on 11/11/2022), Disp: 30 tablet, Rfl: 2   pantoprazole (PROTONIX) 40 MG tablet, Take 1 tablet (40 mg total) by mouth daily., Disp: 30 tablet, Rfl: 0   Prenatal Vit-Fe Fumarate-FA (PRENATAL MULTIVITAMIN) TABS tablet, Take 1 tablet by mouth daily at 12 noon., Disp: , Rfl:    prochlorperazine (COMPAZINE) 10 MG tablet, Take 1 tablet (10 mg total) by mouth every 6 (six) hours as needed (Nausea or vomiting). (Patient not taking: Reported on 11/11/2022), Disp: 30 tablet, Rfl: 1   SUTAB [phone] MG TABS, Take by mouth. (Patient not taking: Reported on 11/11/2022), Disp: , Rfl:    topiramate (TOPAMAX) 50 MG tablet, Take  50 mg by mouth daily., Disp: , Rfl:    traMADol (ULTRAM) 50 MG tablet, Take 1 tablet (50 mg total) by mouth every 6 (six) hours as needed., Disp: 30 tablet, Rfl: 0   urea (CARMOL) 10 % cream, Apply topically 2 (two) times daily. (Patient not taking: Reported on 11/11/2022), Disp: 71 g, Rfl: 0  Review of Symptoms: Pertinent positives as per HPI.  Physical Exam: Deferred given limitations of phone  visit.  Laboratory & Radiologic Studies: None new  Assessment & Plan: Stephanie [name] is a 42 y.o. woman with history of colon cancer, most recently with recurrence to the right ovary, presenting in the setting of a complex left adnexal mass.   Patient is scheduled to come in this afternoon for CBC to assure hemoglobin has not fallen significantly from her last blood draw given report of feeling very tired, weak, and looking pale.  I will contact her with these results.  My office was able to get her pelvic ultrasound moved up and is now scheduled for tomorrow.  The patient understands that I will call by Monday to review these results.  She is interested in discussing surgery sooner rather than later.  I will have my office look at a tentative date for surgery in the upcoming weeks.  With her bleeding and hopefully her cramping, I have sent a new prescription for higher dose progesterone (10 mg Provera) to her pharmacy.  I recommended that she start this and once bleeding subsides or stops, she can decrease the dose to half.  I discussed the assessment and treatment plan with the patient. The patient was provided with an opportunity to ask questions and all were answered. The patient agreed with the plan and demonstrated an understanding of the instructions.   The patient was advised to call back or see an in-person evaluation if the symptoms worsen or if the condition fails to improve as anticipated.   8 minutes of total time was spent for this patient encounter, including preparation, phone  counseling with the patient and coordination of care, and documentation of the encounter.   Jeral Pinch, MD  Division of Gynecologic Oncology  Department of Obstetrics and Gynecology  [facility]

## 2022-12-20 ENCOUNTER — Ambulatory Visit (HOSPITAL_COMMUNITY)
Admission: RE | Admit: 2022-12-20 | Discharge: 2022-12-20 | Disposition: A | Discharge status: Home / Self Care | Payer: Medicare Other | Source: Ambulatory Visit | Attending: Gynecologic Oncology | Admitting: Gynecologic Oncology

## 2022-12-20 DIAGNOSIS — N838 Other noninflammatory disorders of ovary, fallopian tube and broad ligament: Secondary | ICD-10-CM | POA: Insufficient documentation

## 2022-12-22 NOTE — Progress Notes (Signed)
I called the patient to discuss ultrasound results. I reviewed that previously seen cyst has resolved. Her ovary looks overall normal. She is happy to hear this news. She continues to have heavier bleeding after increasing progesterone dose. We discussed doubling her dose for at least the next several days. She voices significant stress and anxiety regarding her remaining ovary. She is in fear that her cancer will come back on that o vary. I have encouraged her to speak with her medical oncologist about this risk. While we can certainly proceed with surgery to remove her remaining ovary, there is risk to such surgery as well as risk to surgical menopause. She voices understanding of this. I will reach out to her medical oncologist. We will tentatively work on finding a date for surgery and scheduling her for a preoperative appointment.   Valarie Cones MD

## 2022-12-25 NOTE — Progress Notes (Signed)
Just a reminder about reaching out to this patient regarding surgical date. Thank you!

## 2022-12-26 ENCOUNTER — Ambulatory Visit (HOSPITAL_COMMUNITY): Payer: BC Managed Care – PPO

## 2022-12-26 ENCOUNTER — Telehealth: Payer: Self-pay

## 2022-12-26 NOTE — Telephone Encounter (Signed)
Ms Stephanie [name] chose Jan 28, 2023 for surgery with Dr. Berline Lopes. She was scheduled for a Pre-op appointment with Dr. Berline Lopes and Joylene John, NP on 01-23-23. [place], Np notified of the above.

## 2022-12-30 ENCOUNTER — Encounter: Payer: Self-pay | Admitting: Hematology

## 2023-01-07 ENCOUNTER — Telehealth: Payer: Self-pay

## 2023-01-07 NOTE — Telephone Encounter (Signed)
See message below from Warner Mccreedy NP  Pt is aware of the need to move surgery date. She has chosen May 15. Aware she will get a call from [facility] for pre-admit testing.  Warner Mccreedy NP aware

## 2023-01-07 NOTE — Telephone Encounter (Signed)
-----   Message from Doylene Bode, NP sent at 01/07/2023  2:46 PM EDT ----- Can you please reach out to the patient and let her know that due to the OR staffing and availability we will have to move her surgery to either May 15th or 16th.  Can you find out which day works better for her please?  Also let her know if things change and if there is cancellations then we will move her up. Thanks

## 2023-01-09 ENCOUNTER — Other Ambulatory Visit: Payer: Self-pay | Admitting: Gynecologic Oncology

## 2023-01-09 DIAGNOSIS — N838 Other noninflammatory disorders of ovary, fallopian tube and broad ligament: Secondary | ICD-10-CM

## 2023-01-09 DIAGNOSIS — N939 Abnormal uterine and vaginal bleeding, unspecified: Secondary | ICD-10-CM

## 2023-01-09 DIAGNOSIS — C19 Malignant neoplasm of rectosigmoid junction: Secondary | ICD-10-CM

## 2023-01-12 ENCOUNTER — Ambulatory Visit (HOSPITAL_COMMUNITY)
Admission: RE | Admit: 2023-01-12 | Discharge: 2023-01-12 | Disposition: A | Discharge status: Home / Self Care | Payer: Medicare Other | Source: Ambulatory Visit | Attending: Hematology | Admitting: Hematology

## 2023-01-12 DIAGNOSIS — C19 Malignant neoplasm of rectosigmoid junction: Secondary | ICD-10-CM | POA: Insufficient documentation

## 2023-01-12 MED ORDER — IOHEXOL 9 MG/ML PO SOLN: 1000.0000 mL | ORAL | Status: AC

## 2023-01-12 MED ORDER — IOHEXOL 300 MG/ML  SOLN
100.0000 mL | Freq: Once | INTRAMUSCULAR | Status: AC | PRN
  Administered 2023-01-12: 100 mL via INTRAVENOUS

## 2023-01-15 ENCOUNTER — Telehealth: Payer: Self-pay

## 2023-01-15 NOTE — Telephone Encounter (Signed)
Pt has been bleeding for 2.5 weeks like a "period bleed".  Pt is still taking the provera and hydrocodone works much better than tramadol. I mentioned the stat US to her as well.

## 2023-01-15 NOTE — Telephone Encounter (Signed)
Pt called due to abnormal bleeding. Pt stated that she has been bleeding through 2/3 pads daily, "bleeding is like she is on her period". The blood is a dark brown color most of the time with some red blood, but no clots. Pt stated that she is cramping, pain is a 9/10, constant, sharp, pinching feeling of her left lower back. Pt stated she takes Tylenol and Tramadol and it is not touching the pain. Pt denies fever & chills.

## 2023-01-16 ENCOUNTER — Telehealth: Payer: Self-pay | Admitting: *Deleted

## 2023-01-16 ENCOUNTER — Encounter: Payer: Self-pay | Admitting: Hematology

## 2023-01-16 ENCOUNTER — Ambulatory Visit (HOSPITAL_BASED_OUTPATIENT_CLINIC_OR_DEPARTMENT_OTHER)
Admission: RE | Admit: 2023-01-16 | Discharge: 2023-01-16 | Disposition: A | Discharge status: Home / Self Care | Payer: Medicare Other | Source: Ambulatory Visit | Attending: Gynecologic Oncology | Admitting: Gynecologic Oncology

## 2023-01-16 ENCOUNTER — Telehealth: Payer: Self-pay | Admitting: Surgery

## 2023-01-16 ENCOUNTER — Other Ambulatory Visit: Payer: Self-pay | Admitting: Gynecologic Oncology

## 2023-01-16 ENCOUNTER — Encounter: Payer: Self-pay | Admitting: Surgery

## 2023-01-16 ENCOUNTER — Telehealth: Payer: Self-pay | Admitting: Gynecologic Oncology

## 2023-01-16 DIAGNOSIS — N83209 Unspecified ovarian cyst, unspecified side: Secondary | ICD-10-CM

## 2023-01-16 DIAGNOSIS — N838 Other noninflammatory disorders of ovary, fallopian tube and broad ligament: Secondary | ICD-10-CM

## 2023-01-16 DIAGNOSIS — R1030 Lower abdominal pain, unspecified: Secondary | ICD-10-CM

## 2023-01-16 DIAGNOSIS — N939 Abnormal uterine and vaginal bleeding, unspecified: Secondary | ICD-10-CM

## 2023-01-16 HISTORY — DX: Unspecified ovarian cyst, unspecified side: N83.209

## 2023-01-16 NOTE — Telephone Encounter (Signed)
Patient called in stating she was told hydrocodone would be called in for her but when she went to pick it up it wasn't there. Per Melissa, hydrocodone not to be called in, recommended patient continue alternating tylenol and ibuprofen, as well as taking her tramadol as prescribed. Patient expressed frustration at this recommendation, stating she has already been doing these things and waited till the last minute to call. States she has been bleeding for 3 weeks already and doesn't know what else to do. Per Melissa, transvaginal ultrasound ordered for further evaluation. Patient verbalized understanding and also stated she needed her 4/26 appt moved due to having to go to family court. Patient advised these appointments would be adjusted and our office will call her back with new appointment times and appt for ultrasound as well.

## 2023-01-16 NOTE — Telephone Encounter (Signed)
PC to patient, informed her of message below, today's U/S results are reassuring.  Patient states she doesn't understand why her recent CT results & today's U/S results are "saying different things."  She is also asking why she hasn't had a biopsy & that she would like for a provider to call her.  Alonza Smoker NP informed.

## 2023-01-16 NOTE — Telephone Encounter (Signed)
-----   Message from Doylene Bode, NP sent at 01/16/2023  2:39 PM EDT ----- Shanda Bumps had been talking to this patient off and on.  Please let her know Dr. Pricilla Holm reviewed her recent ultrasound from today and states the results are very reassuring. She can try the measures Shanda Bumps discussed with her earlier about pain or can contact Dr. Mosetta Putt

## 2023-01-16 NOTE — Telephone Encounter (Signed)
Patient returned call from [place]. Per Melissa, reiterated to patient continuing to take prescribed pain medications, including taking 2 Tramadol. Advised patient of scheduled ultrasound and other moved appointments (see mychart message). Patient advised that someone from our office will call her with ultrasound results. Patient had no further questions at this time.

## 2023-01-16 NOTE — Telephone Encounter (Signed)
Called the patient, discussed her recent bleeding and cramping symptoms.  I recommended that she stop the progesterone altogether to see if this helps.  Reviewed ultrasound findings today which show very thin, normal-appearing lining, decreased size of the cyst on her left ovary which looks like a resolving hemorrhagic cyst.  Patient had questions about why the CT scan and ultrasound are showing different things.  We discussed the different imaging techniques and that pelvic ultrasound is generally better at looking at the pelvic organs.  Additionally, it is quite possible that there is been some decrease in size of the cyst if this is a hemorrhagic cyst over a 4-day period.  The patient was quite worried about the comment of a questionable enhancing mural nodule.  On my review, this may very likely be ovarian tissue.  Overall the size of the cystic lesion on the left is decreased from the CT to the prior CT scan (reassuring when we are thinking about the possibility of a cancer).  I stressed again that both of her ultrasounds have been overall reassuring in terms of findings.  Discussed the utility of an endometrial biopsy.  Given upcoming surgery, I think that this is unnecessary.  Plan will be to send both the uterus and the left ovary for frozen section.  Eugene Garnet MD Gynecologic Oncology

## 2023-01-18 NOTE — Assessment & Plan Note (Addendum)
grade 2, [id number] stage IV with oligo liver metastasis; MMR normal, KRAS (+), right ovarian metastasis in 01/2022  -diagnosed 03/2020 by urgent colectomy for abdominal abscess. Liver metastasis confirmed by biopsy 04/16/20. -Foundation One showed K-ras mutation, she is not a candidate for EGFR inhibitor -s/p FOLFOXIRI/bevacizumab  05/23/20 - 10/24/20. 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 -s/p liver resection on 12/13/20 under Dr. Donell Beers.  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 -CAPEOX started 03/28/22, oxali discontinued after 05/23/22 due to recurrent infusion reaction. -she completed Xeloda in 08/2022. -restaging CT CAP 07/30/22 showed: 3.4 cm lesion in left ovary, likely a cyst; otherwise, no evidence of new or progressive disease. -CEA has been WNL/stable since initially taken after colectomy. -further evaluation with pelvic US on 08/04/22 showed a 3 cm mass in left ovary, favored benign and likely a hemorrhagic cyst.  -PET scan on 08/13/22 showed a hypermetabolic focus in pelvis associated with right fundal region of uterus, without obvious CT abnormality, likely representing a benign fibroid. No findings for local recurrence or metastatic disease. -she was referred to Dr. Pricilla Holm and is scheduled to hold oophorectomy on Feb 11, 2019 [place] Putt

## 2023-01-19 ENCOUNTER — Inpatient Hospital Stay: Payer: Medicare Other | Attending: Nurse Practitioner | Admitting: Hematology

## 2023-01-19 ENCOUNTER — Encounter: Payer: Self-pay | Admitting: Hematology

## 2023-01-19 ENCOUNTER — Inpatient Hospital Stay: Payer: Medicare Other

## 2023-01-19 VITALS — BP 112/79 | HR 69 | Temp 98.2°F | Resp 18 | Ht 68.9 in | Wt 151.1 lb

## 2023-01-19 DIAGNOSIS — N83202 Unspecified ovarian cyst, left side: Secondary | ICD-10-CM | POA: Diagnosis not present

## 2023-01-19 DIAGNOSIS — N83201 Unspecified ovarian cyst, right side: Secondary | ICD-10-CM | POA: Diagnosis not present

## 2023-01-19 DIAGNOSIS — C19 Malignant neoplasm of rectosigmoid junction: Secondary | ICD-10-CM | POA: Diagnosis not present

## 2023-01-19 DIAGNOSIS — Z9221 Personal history of antineoplastic chemotherapy: Secondary | ICD-10-CM | POA: Insufficient documentation

## 2023-01-19 DIAGNOSIS — Z933 Colostomy status: Secondary | ICD-10-CM | POA: Insufficient documentation

## 2023-01-19 DIAGNOSIS — Z85038 Personal history of other malignant neoplasm of large intestine: Secondary | ICD-10-CM | POA: Diagnosis present

## 2023-01-19 DIAGNOSIS — Z95828 Presence of other vascular implants and grafts: Secondary | ICD-10-CM

## 2023-01-19 LAB — CMP (CANCER CENTER ONLY)
ALT: 16 U/L (ref 0–44)
AST: 16 U/L (ref 15–41)
Albumin: 4.4 g/dL (ref 3.5–5.0)
Alkaline Phosphatase: 43 U/L (ref 38–126)
Anion gap: 4 — ABNORMAL LOW (ref 5–15)
BUN: 10 mg/dL (ref 6–20)
CO2: 28 mmol/L (ref 22–32)
Calcium: 9.5 mg/dL (ref 8.9–10.3)
Chloride: 107 mmol/L (ref 98–111)
Creatinine: 0.68 mg/dL (ref 0.44–1.00)
GFR, Estimated: >60 mL/min (ref >60)
Glucose, Bld: 108 mg/dL — ABNORMAL HIGH (ref 70–99)
Potassium: 4 mmol/L (ref 3.5–5.1)
Sodium: 139 mmol/L (ref 135–145)
Total Bilirubin: 0.6 mg/dL (ref 0.3–1.2)
Total Protein: 7.1 g/dL (ref 6.5–8.1)

## 2023-01-19 LAB — CBC WITH DIFFERENTIAL (CANCER CENTER ONLY)
Abs Immature Granulocytes: 0.01 10*3/uL (ref 0.00–0.07)
Basophils Absolute: 0 10*3/uL (ref 0.0–0.1)
Basophils Relative: 1 %
Eosinophils Absolute: 0.1 10*3/uL (ref 0.0–0.5)
Eosinophils Relative: 1 %
HCT: 42.8 % (ref 36.0–46.0)
Hemoglobin: 14.7 g/dL (ref 12.0–15.0)
Immature Granulocytes: 0 %
Lymphocytes Relative: 25 %
Lymphs Abs: 1 10*3/uL (ref 0.7–4.0)
MCH: 31.5 pg (ref 26.0–34.0)
MCHC: 34.3 g/dL (ref 30.0–36.0)
MCV: 91.8 fL (ref 80.0–100.0)
Monocytes Absolute: 0.4 10*3/uL (ref 0.1–1.0)
Monocytes Relative: 10 %
Neutro Abs: 2.3 10*3/uL (ref 1.7–7.7)
Neutrophils Relative %: 63 %
Platelet Count: 121 10*3/uL — ABNORMAL LOW (ref 150–400)
RBC: 4.66 MIL/uL (ref 3.87–5.11)
RDW: 12.4 % (ref 11.5–15.5)
WBC Count: 3.7 10*3/uL — ABNORMAL LOW (ref 4.0–10.5)
nRBC: 0 % (ref 0.0–0.2)

## 2023-01-19 LAB — CEA (IN HOUSE-CHCC): CEA (CHCC-In House): 3.04 ng/mL (ref 0.00–5.00)

## 2023-01-19 MED ORDER — HEPARIN SOD (PORK) LOCK FLUSH 100 UNIT/ML IV SOLN
500.0000 [IU] | Freq: Once | INTRAVENOUS | Status: AC
  Administered 2023-01-19: 500 [IU]

## 2023-01-19 MED ORDER — SODIUM CHLORIDE 0.9% FLUSH
10.0000 mL | Freq: Once | INTRAVENOUS | Status: AC
  Administered 2023-01-19: 10 mL

## 2023-01-19 MED ORDER — HYDROCODONE-ACETAMINOPHEN 5-325 MG PO TABS
1.0000 | ORAL_TABLET | Freq: Four times a day (QID) | ORAL | 0 refills | Status: DC | PRN
Start: 2023-01-19 — End: 2023-02-05

## 2023-01-19 MED ORDER — PROCHLORPERAZINE MALEATE 10 MG PO TABS
10.0000 mg | ORAL_TABLET | Freq: Four times a day (QID) | ORAL | 1 refills | Status: AC | PRN
Start: 2023-01-19 — End: ?

## 2023-01-19 NOTE — Progress Notes (Signed)
[facility]   [phone] Fax:([phone]   Clinic Follow up Note   Patient Care Team: Associates, [facility] as PCP - General Malachy Mood, MD as Consulting Physician (Oncology) Almond Lint, MD as Consulting Physician (General Surgery) Pollyann Samples, NP as Stephanie [name] Practitioner (Stephanie [name] Practitioner) Warden Fillers, MD as Consulting Physician (Obstetrics and Gynecology) Lyn Henri, MD as Consulting Physician (Obstetrics and Gynecology)  Date of Service:  01/19/2023  CHIEF COMPLAINT: f/u of  metastatic colon cancer     CURRENT THERAPY:  -Xeloda, q21d, starting 03/28/22, currently 1500mg  am and 1000mg  pm day 1-14 from 05/19/22    ASSESSMENT:  PIERA [name] is a 42 y.o. 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 Liver metastasis confirmed by biopsy 04/16/20. -Foundation One showed K-ras mutation, she is not a candidate for EGFR inhibitor -s/p FOLFOXIRI/bevacizumab  05/23/20 - 10/24/20. 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 -s/p liver resection on 12/13/20 under Dr. Donell Beers.  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 -CAPEOX started 03/28/22, oxali discontinued after 05/23/22 due to recurrent infusion reaction. -she completed Xeloda in 08/2022. -restaging CT CAP 07/30/22 showed: 3.4 cm lesion in left ovary, likely a cyst; otherwise, no evidence of new or progressive disease. -CEA has been WNL/stable since initially taken after colectomy. -further evaluation with pelvic US on 08/04/22 showed a 3 cm mass in left ovary, favored benign and likely a hemorrhagic cyst.  -PET scan on 08/13/22 showed a hypermetabolic focus in pelvis associated with right fundal region of uterus, without obvious CT abnormality, likely  representing a benign fibroid. No findings for local recurrence or metastatic disease. -she was referred to Dr. Pricilla Holm and is scheduled to hold oophorectomy on Feb 11, 2023 -She has developed continuous vaginal bleeding and left-sided upper buttock pain, which she feels is related to the left hemorrhagic ovarian cyst. -I reviewed her CT scan from January 02, 2014 2024, and pelvic ultrasound from January 16, 2023, which showed decreased size of the left ovarian cyst with solid component, no other evidence of cancer recurrence.  I discussed with her. -Due to her pain and concern for metastatic disease, she would like to have scheduled hysterectomy and left oophorectomy sooner than May 15, I will check with Dr. Pricilla Holm.     PLAN: -lab reviewed WBC 3.7,platelet count 121 -CMP normal -Oophorectomy and hysterectomy schedule 5/15, I will check with Dr. Pricilla Holm to see if she can move this up -I prescribe Vicodin -f/u phone visit 1-2 weeks after surgery     SUMMARY OF ONCOLOGIC HISTORY: Oncology History Overview Note  Cancer Staging Malignant neoplasm of rectosigmoid junction [facility]) Staging form: Colon and Rectum, AJCC 8th Edition - Pathologic stage from 04/04/2020: pT4a, pN1b, cM1 - Signed by Pollyann Samples, NP on 05/07/2020    Malignant neoplasm of rectosigmoid junction  11/10/2019 Imaging   MRI Abdomen  IMPRESSION: 1. Redemonstrated hypoenhancing lesion of the posterior liver dome, hepatic segment VII, reduced in size compared to prior examination, measuring 1.3 x 1.3 cm, previously 1.8 x 1.7 cm. Findings are consistent with treatment response of a biopsy proven metastasis. No other evidence of lymphadenopathy or metastatic disease within the abdomen or pelvis. 2. Unchanged mild splenomegaly, maximum coronal span 14.0 cm. 3. 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   03/12/2020  Initial Diagnosis   Malignant neoplasm of rectosigmoid junction (HCC)   03/12/2020  Imaging   CT AP with contrast IMPRESSION: 1. Overall findings are highly concerning for colorectal carcinoma involving the sigmoid colon with an associated perforation and adjacent abscess and phlegmon formation as detailed above. Currently, no collection is amenable to percutaneous drainage given their small size and location. 2. New 2 cm mass in the right hepatic lobe concerning for metastatic disease to the liver until proven otherwise. 3. Enlarged regional lymph nodes as detailed above is concerning for nodal metastatic disease. 4. Large stool burden. 5. Prominent pelvic veins which can be seen in patients with pelvic congestion syndrome.   03/13/2020 Imaging   ABD US IMPRESSION: Approximately 2.1 x 2.4 x 2.0 cm lobular homogeneously echogenic lesion in the right hepatic dome corresponds with the abnormality seen on the prior CT scan. Sonographically, this appearance is highly suggestive of a benign hemangioma.   Recommend MRI of the abdomen with gadolinium contrast which may provide a noninvasive diagnosis of benign hemangioma.    03/13/2020 Imaging   MR ABD W/WO CONTRAST Hepatobiliary: Diffuse low signal intensity throughout the hepatic parenchyma on T2 weighted images, presumably a consequence of recent Feraheme injection. In segment 7 of the liver (axial image 8 of series 5) there is a 2.5 x 1.9 cm well-defined lesion which is slightly T2 hyperintense. This lesion appears hyperintense on pre gadolinium T1 weighted images (likely a consequence of Feraheme). Interpretation of enhancement within the lesion is compromised by presence of Feraheme. No other hepatic lesions are confidently identified on today's examination. No intra or extrahepatic biliary ductal dilatation. Gallbladder is normal in appearance.   03/31/2020 Imaging   CT AP W contrast IMPRESSION: 1. Previously noted sigmoid colon mass appears increased in size, and again appears to be associated with a focal  contained perforation which crosses the midline and has fistulized into the left adnexal region where there is now what appears to be a large left tubo-ovarian abscess, as detailed above. This is also associated with multiple enlarged lymph nodes in the pelvis measuring up to 1.2 cm in short axis and borderline enlarged retroperitoneal lymph nodes, concerning for metastatic disease. In addition, previously suspected metastatic lesion in segment 7 of the liver has enlarged. 2. Small volume of ascites. 3. Additional incidental findings, as above.   04/04/2020 Cancer Staging   Staging form: Colon and Rectum, AJCC 8th Edition - Pathologic stage from 04/04/2020: pT4a, pN1b, cM1 - Signed by Pollyann Samples, NP on 05/07/2020   04/04/2020 Procedure   Paracentesis, path showed no malignant cells (mixed acute and chronic inflammation present)   04/04/2020 Surgery   Open sigmoid colectomy and end colostomy by Dr. Almond Lint   04/04/2020 Pathology Results   FINAL MICROSCOPIC DIAGNOSIS: A. COLON, RECTOSIGMOID, RESECTION: - Invasive moderately differentiated adenocarcinoma, 6 cm, involving rectosigmoid junction - Carcinoma invades into serosal surface with perforation and associated serositis - Radial resection margin is positive for carcinoma; proximal and distal margins are not involved - Lymphovascular invasion is present - Metastatic carcinoma to one of fifteen lymph nodes (1/15); one tumor deposit - See oncology table B. LYMPH NODES, MESENTERIC, RESECTION: - Metastatic adenocarcinoma to one of six lymph nodes (1/6) - One tumor deposit  Addendum to note 2 involved lymph nodes (of 21 examined nodes) pT4a,pN1b MMR-normal, preserved expression of MLH1, MSH2, MSH6, PMS2   04/12/2020 Procedure   PAC placement    04/13/2020 Imaging   CT chest without contrast IMPRESSION: Interval development  of bilateral pleural effusions, left slightly greater than right, with resultant bibasilar atelectasis  including subtotal collapse of the left lower lobe. No evidence of intrathoracic metastatic disease, though evaluation of the collapsed parenchyma is limited. Hepatic metastasis again demonstrated.     04/16/2020 Pathology Results   FINAL MICROSCOPIC DIAGNOSIS:  A. LIVER, RIGHT LOBE, BIOPSY:  - Adenocarcinoma.  COMMENT:  The morphology is compatible with the provided clinical history of colorectal carcinoma.    05/23/2020 - 10/24/2020 Chemotherapy   FOLFIRINOX q2weeks for 3-6 months starting 05/23/20. Bevacizumab-bvzr Omer Jack) added with C2. Oxaliplatin held with C11-12 due to reaction. (pt developed SOB, chest palpitation and abdominal discomfort shortly after oxaliplatin started). Completed on 10/24/20.   08/05/2020 Imaging   CT AP  IMPRESSION: 1. Postsurgical changes of distal colectomy with a left lower quadrant end ostomy. No evidence of obstruction or acute complication at this time. Excluded rectal pouch in the deep pelvis without acute complication or worrisome features. 2. Slight interval decrease in size of a hypoattenuating lesion posterior right lobe liver measuring 1.7 x 1.8 x 2 cm. This lesion has previously undergone ultrasound-guided biopsy with pathologic results demonstrating adenocarcinoma compatible with metastatic disease from patient's resected colorectal carcinoma. 3. Slight prominence of the parametrial vessels bilaterally, nonspecific though can be seen in the setting of pelvic congestion syndrome. 4. Mild splenomegaly.  No focal lesion.     11/12/2020 Imaging   CT Chest  IMPRESSION: 1. No evidence of metastatic disease in the chest. 2. 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   12/13/2020 Surgery   A. LIVER, RIGHT, PARTIAL HEPATECTOMY WITH GALLBLADDER:  - Metastatic colon carcinoma to the liver showing approximately 80%  necrosis  - Resection margin is 0.8 cm from carcinoma  - Uninvolved liver parenchyma with  no specific histopathologic changes  - Gallbladder with no specific histopathologic changes    01/28/2021 Genetic Testing   Negative genetic testing:  No pathogenic variants detected on the [employer] CustomNext-Cancer + RNAinsight panel. The report date is 01/28/2021.   The CustomNext-Cancer+RNAinsight panel offered by [facility] included sequencing and rearrangement analysis for the following 47 genes:  APC, ATM, AXIN2, BARD1, BMPR1A, BRCA1, BRCA2, BRIP1, CDH1, CDK4, CDKN2A, CHEK2, DICER1, EPCAM, GREM1, HOXB13, MEN1, MLH1, MSH2, MSH3, MSH6, MUTYH, NBN, NF1, NF2, NTHL1, PALB2, PMS2, POLD1, POLE, PTEN, RAD51C, RAD51D, RECQL, RET, SDHA, SDHAF2, SDHB, SDHC, SDHD, SMAD4, SMARCA4, STK11, TP53, TSC1, TSC2, and VHL.  RNA data is routinely analyzed for use in variant interpretation for all genes.   02/19/2021 Imaging   CT A/P w/o contrast  IMPRESSION: Slightly limited examination examination in absence of contrast administration. Status post partial right hepatectomy. Interval decrease in size in perihepatic fluid collection and resolution of right subdiaphragmatic fluid and gas. No new intra-abdominal fluid collections are identified.   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 Moderate stool throughout the colon without evidence of obstruction.   Fluid distension of the proximal duodenum to the level of the SMA hiatus which appears narrow. 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   05/05/2021 Imaging   CT AP  IMPRESSION: Postsurgical changes as described stable in appearance from the prior exam.   Changes suggestive of mild pelvic varices.   08/22/2021 Imaging   EXAM: CT ABDOMEN AND PELVIS WITH CONTRAST  IMPRESSION: 1. Extensive bowel content is identified throughout the colon consistent with constipation. 2. Findings  of descending colostomy  with anastomosis at the rectosigmoid junction is unchanged. 3. Stable postoperative changes in the right lobe liver.   09/12/2021 Survivorship   SCP delivered by Santiago Glad, NP   10/05/2021 Imaging   EXAM: CT ABDOMEN AND PELVIS WITH CONTRAST  IMPRESSION: 1. Right ovarian/adnexal cyst, 5.2 cm. Because this lesion is not adequately characterized, prompt [place] is recommended for further evaluation. Note: This recommendation does not apply to premenarchal patients and to those with increased risk (genetic, family history, elevated tumor markers or other high-risk factors) of ovarian cancer. Reference: JACR 2020 Feb; 17(2):248-254 2. No other evidence of an acute abnormality within the abdomen or pelvis. 3. Moderate increase in the colonic and rectal stool burden. No bowel obstruction or inflammation. 4. No evidence of locally recurrent or metastatic colon carcinoma.   11/12/2021 Imaging   EXAM: CT CHEST WITHOUT CONTRAST  IMPRESSION: 1. No evidence of pulmonary metastatic disease. 2. Stable postoperative findings about the posterior right lobe of the liver.   03/28/2022 - 04/18/2022 Chemotherapy   Patient is on Treatment Plan : COLORECTAL Xelox (Capeox) q21d     05/23/2022 - 05/23/2022 Chemotherapy   Patient is on Treatment Plan : COLORECTAL CAPEOX (130/850) q21d x 8 cycles        INTERVAL HISTORY:  Stephanie [name] is here for a follow up of  metastatic colon cancer . She was last seen by me on 10/17/2022. She presents to the clinic accompanied by husband. Pt is schedule for surgery in May. Pt report of vaginal bleeding for three weeks and she is having a pain on her bottom. She reports her pain rate is at 9-10. Pt is taking Tramadol and has increase her dose. Pt state that she has some nausea and diarrhea off and on.      All other systems were reviewed with the patient and are negative.  MEDICAL HISTORY:  Past Medical History:  Diagnosis Date   Anxiety    Blood transfusion  without reported diagnosis    Bowel obstruction    BV (bacterial vaginosis)    Colon cancer    Colon Cancer 03-2020   Colon polyps    Colostomy present    03-2020   Family history of bladder cancer    History of chemotherapy    ended 09-2020   Lactose intolerance 03/12/2020   Neuromuscular disorder    neuropathy feet hands legs   UTI (lower urinary tract infection)     SURGICAL HISTORY: Past Surgical History:  Procedure Laterality Date   CESAREAN SECTION     CHOLECYSTECTOMY  12/13/2020   COLECTOMY  03/2020   COLONOSCOPY     COLOSTOMY TAKEDOWN N/A 05/30/2021   Procedure: LAPAROSCOPIC ASSISTED COLOSTOMY TAKEDOWN;  Surgeon: Almond Lint, MD;  Location: [facility] OR;  Service: General;  Laterality: N/A;   CYSTOSCOPY WITH STENT PLACEMENT  04/04/2020   Procedure: CYSTOSCOPY WITH STENT PLACEMENT;  Surgeon: Almond Lint, MD;  Location: [facility] OR[facility];  Service: General;;   LAPAROSCOPIC LIVER ULTRASOUND N/A 12/13/2020   Procedure: INTRAOPERATIVE LIVER ULTRASOUND;  Surgeon: Almond Lint, MD;  Location: [facility] OR;  Service: General;  Laterality: N/A;   LAPAROSCOPY N/A 12/13/2020   Procedure: LAPAROSCOPY DIAGNOSTIC;  Surgeon: Almond Lint, MD;  Location: [facility] OR;  Service: General;  Laterality: N/A;   LAPAROTOMY N/A 04/04/2020   Procedure: EXPLORATORY LAPAROTOMY;  Surgeon: Almond Lint, MD;  Location: [facility];  Service: General;  Laterality: N/A;   OPEN PARTIAL HEPATECTOMY  N/A 12/13/2020   Procedure: OPEN PARTIAL  HEPATECTOMY;  Surgeon: Almond Lint, MD;  Location: [facility] OR;  Service: General;  Laterality: N/A;  ROOM 2 STARTING AT 09:30AM FOR 300 MIN   PORTACATH PLACEMENT Right 04/12/2020   Procedure: INSERTION PORT-A-CATH WITH ULTRASOUND;  Surgeon: Sheliah Hatch De Blanch, MD;  Location: [facility];  Service: General;  Laterality: Right;    I have reviewed the social history and family history with the patient and they are unchanged from previous note.  ALLERGIES:  is allergic to oxaliplatin.  MEDICATIONS:   Current Outpatient Medications  Medication Sig Dispense Refill   acetaminophen (TYLENOL) 325 MG tablet Take 2 tablets (650 mg total) by mouth every 6 (six) hours as needed for mild pain, moderate pain, fever or headache (fever > 101).     famotidine (PEPCID) 20 MG tablet Take 1 tablet (20 mg total) by mouth 2 (two) times daily. (Patient not taking: Reported on 11/11/2022) 30 tablet 0   HYDROcodone-acetaminophen (NORCO/VICODIN) 5-325 MG tablet Take 1 tablet by mouth every 6 (six) hours as needed for severe pain. 60 tablet 0   ibuprofen (ADVIL) 600 MG tablet Take 1 tablet (600 mg total) by mouth every 6 (six) hours as needed for headache, mild pain, moderate pain or cramping. 30 tablet 2   Lactic Ac-Citric Ac-Pot Bitart (PHEXXI) 1.8-1-0.4 % GEL Place 5 g vaginally as needed. (Patient not taking: Reported on 11/11/2022) 60 g 1   medroxyPROGESTERone (PROVERA) 10 MG tablet Take 1 tablet (10 mg total) by mouth daily. 30 tablet 1   metroNIDAZOLE (FLAGYL) 500 MG tablet Take 1 tablet (500 mg total) by mouth 2 (two) times daily. (Patient not taking: Reported on 11/11/2022) 14 tablet 0   norethindrone (MICRONOR) 0.35 MG tablet Take 1 tablet (0.35 mg total) by mouth daily. 28 tablet 11   ondansetron (ZOFRAN) 8 MG tablet TAKE 1 TABLET(8 MG) BY MOUTH EVERY 8 HOURS AS NEEDED FOR NAUSEA OR VOMITING (Patient not taking: Reported on 11/11/2022) 30 tablet 2   pantoprazole (PROTONIX) 40 MG tablet Take 1 tablet (40 mg total) by mouth daily. 30 tablet 0   Prenatal Vit-Fe Fumarate-FA (PRENATAL MULTIVITAMIN) TABS tablet Take 1 tablet by mouth daily at 12 noon.     prochlorperazine (COMPAZINE) 10 MG tablet Take 1 tablet (10 mg total) by mouth every 6 (six) hours as needed (Nausea or vomiting). 30 tablet 1   SUTAB [phone] MG TABS Take by mouth. (Patient not taking: Reported on 11/11/2022)     topiramate (TOPAMAX) 50 MG tablet Take 50 mg by mouth daily.     traMADol (ULTRAM) 50 MG tablet Take 1 tablet (50 mg total) by mouth  every 6 (six) hours as needed. 30 tablet 0   urea (CARMOL) 10 % cream Apply topically 2 (two) times daily. (Patient not taking: Reported on 11/11/2022) 71 g 0   No current facility-administered medications for this visit.    PHYSICAL EXAMINATION: ECOG PERFORMANCE STATUS: 1 - Symptomatic but completely ambulatory  Vitals:   01/19/23 1136  BP: 112/79  Pulse: 69  Resp: 18  Temp: 98.2 F (36.8 C)  SpO2: 100%   Wt Readings from Last 3 Encounters:  01/19/23 151 lb 1.6 oz (68.5 kg)  11/14/22 155 lb 4.8 oz (70.4 kg)  10/17/22 166 lb 3.2 oz (75.4 kg)     GENERAL:alert, no distress and comfortable SKIN: skin color normal, no rashes or significant lesions EYES: normal, Conjunctiva are pink and non-injected, sclera clear  NEURO: alert & oriented x 3 with fluent speech  LABORATORY DATA:  I have reviewed the data as listed    Latest Ref Rng & Units 01/19/2023   11:01 AM 12/19/2022   12:41 PM 12/01/2022    9:50 AM  CBC  WBC 4.0 - 10.5 K/uL 3.7  5.1  4.3   Hemoglobin 12.0 - 15.0 g/dL 25.3  66.4  40.3   Hematocrit 36.0 - 46.0 % 42.8  44.0  40.2   Platelets 150 - 400 K/uL 121  122  118         Latest Ref Rng & Units 01/19/2023   11:01 AM 12/19/2022   12:41 PM 12/01/2022    9:50 AM  CMP  Glucose 70 - 99 mg/dL 474  90  87   BUN 6 - 20 mg/dL 10  15  14    Creatinine 0.44 - 1.00 mg/dL 2.59  5.63  8.75   Sodium 135 - 145 mmol/L 139  138  137   Potassium 3.5 - 5.1 mmol/L 4.0  4.4  4.5   Chloride 98 - 111 mmol/L 107  105  104   CO2 22 - 32 mmol/L 28  28  28    Calcium 8.9 - 10.3 mg/dL 9.5  9.4  9.3   Total Protein 6.5 - 8.1 g/dL 7.1  7.1  6.9   Total Bilirubin 0.3 - 1.2 mg/dL 0.6  0.6  0.5   Alkaline Phos 38 - 126 U/L 43  49  55   AST 15 - 41 U/L 16  18  17    ALT 0 - 44 U/L 16  18  16        RADIOGRAPHIC STUDIES: I have personally reviewed the radiological images as listed and agreed with the findings in the report. No results found.    No orders of the defined types were placed in  this encounter.  All questions were answered. The patient knows to call the clinic with any problems, questions or concerns. No barriers to learning was detected. The total time spent in the appointment was 25 minutes.     Malachy Mood, MD 01/19/2023   Carolin Coy, CMA, am acting as scribe for Malachy Mood, MD.   I have reviewed the above documentation for accuracy and completeness, and I agree with the above.

## 2023-01-20 ENCOUNTER — Telehealth: Payer: Self-pay

## 2023-01-20 ENCOUNTER — Encounter: Payer: Self-pay | Admitting: Hematology

## 2023-01-20 NOTE — Telephone Encounter (Signed)
Called pt to inform her that Dr. Pricilla Holm made special arrangements to bump her surgery to 5/3 due to symptoms. Pt agreed. Pt is available any day but April 26th for prior appts.

## 2023-01-20 NOTE — Telephone Encounter (Signed)
-----   Message from Doylene Bode, NP sent at 01/20/2023 10:44 AM EDT ----- Please reach out to patient about upcoming surgery.  Let her know that Dr. Pricilla Holm said she will come in on her vacation day to get her surgery done sooner on May 3rd given all the symptoms she is having.

## 2023-01-21 ENCOUNTER — Other Ambulatory Visit: Payer: Self-pay

## 2023-01-21 ENCOUNTER — Encounter (HOSPITAL_BASED_OUTPATIENT_CLINIC_OR_DEPARTMENT_OTHER): Payer: Self-pay | Admitting: Gynecologic Oncology

## 2023-01-21 ENCOUNTER — Inpatient Hospital Stay: Payer: Medicare Other | Admitting: Gynecologic Oncology

## 2023-01-21 ENCOUNTER — Encounter: Payer: Self-pay | Admitting: Gynecologic Oncology

## 2023-01-21 ENCOUNTER — Telehealth: Payer: Self-pay | Admitting: Gynecologic Oncology

## 2023-01-21 DIAGNOSIS — Z01818 Encounter for other preprocedural examination: Secondary | ICD-10-CM | POA: Diagnosis present

## 2023-01-21 DIAGNOSIS — C19 Malignant neoplasm of rectosigmoid junction: Secondary | ICD-10-CM | POA: Diagnosis not present

## 2023-01-21 DIAGNOSIS — N83201 Unspecified ovarian cyst, right side: Secondary | ICD-10-CM | POA: Diagnosis not present

## 2023-01-21 NOTE — Telephone Encounter (Signed)
Attempted to call patient to review preoperative and postoperative instructions for upcoming surgery with Dr. Eugene Garnet on May 3.  Had to leave a message for the patient advising her to please reach out to the office when available.

## 2023-01-21 NOTE — Progress Notes (Signed)
Your procedure is scheduled on Friday, 01/30/2023.  Report to [facility] AT  10:15 AM.   Call this number if you have problems the morning of surgery  :[phone].   OUR ADDRESS IS [address].  WE ARE LOCATED IN [facility].  PLEASE BRING YOUR INSURANCE CARD AND PHOTO ID DAY OF SURGERY.  ONLY 2 PEOPLE ARE ALLOWED IN  WAITING  ROOM                                      REMEMBER:  DO NOT EAT FOOD, CANDY GUM OR MINTS  AFTER MIDNIGHT THE NIGHT BEFORE YOUR SURGERY . YOU MAY HAVE CLEAR LIQUIDS FROM MIDNIGHT THE NIGHT BEFORE YOUR SURGERY UNTIL  9:15 AM. NO CLEAR LIQUIDS AFTER   9:15 AM DAY OF SURGERY.  YOU MAY  BRUSH YOUR TEETH MORNING OF SURGERY AND RINSE YOUR MOUTH OUT, NO CHEWING GUM CANDY OR MINTS.     CLEAR LIQUID DIET    Allowed      Water                                                                   Coffee and tea, regular and decaf  (NO cream or milk products of any type, may sweeten)                         Carbonated beverages, regular and diet                                    Sports drinks like Gatorade _____________________________________________________________________     TAKE ONLY THESE MEDICATIONS MORNING OF SURGERY IF NEEDED: Compazine, Clonazepam, Tramadol, Hydrocodone-acetaminophen, Zofran                                        DO NOT WEAR JEWERLY/  METAL/  PIERCINGS (INCLUDING NO PLASTIC PIERCINGS) DO NOT WEAR LOTIONS, POWDERS, PERFUMES OR NAIL POLISH ON YOUR FINGERNAILS. TOENAIL POLISH IS OK TO WEAR. DO NOT SHAVE FOR 48 HOURS PRIOR TO DAY OF SURGERY.  CONTACTS, GLASSES, OR DENTURES MAY NOT BE WORN TO SURGERY.  REMEMBER: NO SMOKING, VAPING ,  DRUGS OR ALCOHOL FOR 24 HOURS BEFORE YOUR SURGERY.                                    [facility] IS NOT RESPONSIBLE  FOR ANY BELONGINGS.                                                                    [name]           [place] -  Preparing for Surgery Before  surgery, you can play an important role.  Because skin is not sterile, your skin needs to be as free of germs as possible.  You can reduce the number of germs on your skin by washing with CHG (chlorahexidine gluconate) soap before surgery.  CHG is an antiseptic cleaner which kills germs and bonds with the skin to continue killing germs even after washing. Please DO NOT use if you have an allergy to CHG or antibacterial soaps.  If your skin becomes reddened/irritated stop using the CHG and inform your nurse when you arrive at Short Stay. Do not shave (including legs and underarms) for at least 48 hours prior to the first CHG shower.  You may shave your face/neck. Please follow these instructions carefully:  1.  Shower with CHG Soap the night before surgery and the  morning of Surgery.  2.  If you choose to wash your hair, wash your hair first as usual with your  normal  shampoo.  3.  After you shampoo, rinse your hair and body thoroughly to remove the  shampoo.                                        4.  Use CHG as you would any other liquid soap.  You can apply chg directly  to the skin and wash , chg soap provided, night before and morning of your surgery.  5.  Apply the CHG Soap to your body ONLY FROM THE NECK DOWN.   Do not use on face/ open                           Wound or open sores. Avoid contact with eyes, ears mouth and genitals (private parts).                       Wash face,  Genitals (private parts) with your normal soap.             6.  Wash thoroughly, paying special attention to the area where your surgery  will be performed.  7.  Thoroughly rinse your body with warm water from the neck down.  8.  DO NOT shower/wash with your normal soap after using and rinsing off  the CHG Soap.             9.  Pat yourself dry with a clean towel.            10.  Wear clean pajamas.            11.  Place clean sheets on your bed the night of your first shower and do not  sleep with pets. Day of  Surgery : Do not apply any lotions/ powders the morning of surgery.  Please wear clean clothes to the hospital/surgery center.  IF YOU HAVE ANY SKIN IRRITATION OR PROBLEMS WITH THE SURGICAL SOAP, PLEASE GET A BAR OF GOLD DIAL SOAP AND SHOWER THE NIGHT BEFORE YOUR SURGERY AND THE MORNING OF YOUR SURGERY. PLEASE LET THE NURSE KNOW MORNING OF YOUR SURGERY IF YOU HAD ANY PROBLEMS WITH THE SURGICAL SOAP.   YOUR SURGEON MAY HAVE REQUESTED EXTENDED RECOVERY TIME AFTER YOUR SURGERY. IT COULD BE A  JUST A FEW HOURS  UP TO AN OVERNIGHT STAY.  YOUR SURGEON SHOULD HAVE DISCUSSED THIS WITH  YOU PRIOR TO YOUR SURGERY. IN THE EVENT YOU NEED TO STAY OVERNIGHT PLEASE REFER TO THE FOLLOWING GUIDELINES. YOU MAY HAVE UP TO 4 VISITORS  MAY VISIT IN THE EXTENDED RECOVERY ROOM UNTIL 800 PM ONLY.  ONE  VISITOR AGE 23 AND OVER MAY SPEND THE NIGHT AND MUST BE IN EXTENDED RECOVERY ROOM NO LATER THAN 800 PM . YOUR DISCHARGE TIME AFTER YOU SPEND THE NIGHT IS 900 AM THE MORNING AFTER YOUR SURGERY. YOU MAY PACK A SMALL OVERNIGHT BAG WITH TOILETRIES FOR YOUR OVERNIGHT STAY IF YOU WISH.  REGARDLESS OF IF YOU STAY OVER NIGHT OR ARE DISCHARGED THE SAME DAY YOU WILL BE REQUIRED TO HAVE A RESPONSIBLE ADULT (18 YRS OLD OR OLDER) STAY WITH YOU FOR AT LEAST THE FIRST 24 HOURS  YOUR PRESCRIPTION MEDICATIONS WILL BE PROVIDED DURING YOUR HOSPITAL STAY.  ________________________________________________________________________                                                        QUESTIONS Mechele Claude PRE OP NURSE PHONE [phone].

## 2023-01-21 NOTE — Progress Notes (Addendum)
Spoke w/ via phone for pre-op interview---[facility] needs dos----urine pregnancy               Lab results------01/28/23 lab appt for cbc w/diff/plat, cmp, type & screen, 07/18/22 EKG in chart & Epic COVID test -----patient states asymptomatic no test needed Arrive at -------1015 on Friday, 01/30/23 NPO after MN NO Solid Food.  Clear liquids from MN until---0915 Med rec completed Medications to take morning of surgery -----Compazine prn, Clonazepam prn, Tramadol prn, Hydrocodone-acetaminophen prn, Zofran prn Diabetic medication -----n/a Patient instructed no nail polish to be worn day of surgery Patient instructed to bring photo id and insurance card day of surgery Patient aware to have Driver (ride ) / caregiver    for 24 hours after surgery - husband, Verdon Cummins Patient Special Instructions -----Extended / overnight stay instructions given. Pre-Op special Instructions -----Port a cath in place (right chest.) Patient verbalized understanding of instructions that were given at this phone interview. Patient denies shortness of breath, chest pain, fever, cough at this phone interview.  Patient has a hx of Stage IV colon cancer w/ mets. Patient follows w/ Dr. Malachy Mood @ [facility]. 01/19/23 OV note in chart.

## 2023-01-23 ENCOUNTER — Inpatient Hospital Stay: Payer: Medicare Other | Admitting: Gynecologic Oncology

## 2023-01-23 ENCOUNTER — Encounter: Payer: Self-pay | Admitting: Surgery

## 2023-01-23 ENCOUNTER — Encounter: Payer: BC Managed Care – PPO | Admitting: Gynecologic Oncology

## 2023-01-26 ENCOUNTER — Telehealth: Payer: Self-pay | Admitting: Oncology

## 2023-01-26 ENCOUNTER — Telehealth: Payer: Self-pay | Admitting: Surgery

## 2023-01-26 NOTE — Telephone Encounter (Signed)
Called patient to review pre-op instructions. (See patient message from [facility] on 4/24) Reviewed all instructions in detail. Patient verbalized instructions and had no questions or concerns. Confirmed with patient that surgery will be 5/2 at [facility].

## 2023-01-26 NOTE — Patient Instructions (Addendum)
SURGICAL WAITING ROOM VISITATION  Patients having surgery or a procedure may have no more than 2 support people in the waiting area - these visitors may rotate.    Children under the age of 74 must have an adult with them who is not the patient.  Due to an increase in RSV and influenza rates and associated hospitalizations, children ages 25 and under may not visit patients in [facility] hospitals.  If the patient needs to stay at the hospital during part of their recovery, the visitor guidelines for inpatient rooms apply. Pre-op nurse will coordinate an appropriate time for 1 support person to accompany patient in pre-op.  This support person may not rotate.    Please refer to the [facility] website for the visitor guidelines for Inpatients (after your surgery is over and you are in a regular room).       Your procedure is scheduled on: Thursday, Jan 29, 2023   Report to [facility] Main Entrance    Report to admitting at 5:15 AM   Call this number if you have problems the morning of surgery [phone]   Light diet the day before (avoid gas producing foods and drinks like carbonated beverages).    Do not eat food :After Midnight.   After Midnight you may have the following liquids until 4:30 AM DAY OF SURGERY  Water Non-Citrus Juices (without pulp, NO RED-Apple, White grape, White cranberry) Black Coffee (NO MILK/CREAM OR CREAMERS, sugar ok)  Clear Tea (NO MILK/CREAM OR CREAMERS, sugar ok) regular and decaf                             Plain Jell-O (NO RED)                                           Fruit ices (not with fruit pulp, NO RED)                                     Popsicles (NO RED)                                                               Sports drinks like Gatorade (NO RED)  FOLLOW BOWEL PREP AND ANY ADDITIONAL PRE OP INSTRUCTIONS YOU RECEIVED FROM YOUR SURGEON'S OFFICE!!!     Oral Hygiene is also important to reduce your risk of infection.                                     Remember - BRUSH YOUR TEETH THE MORNING OF SURGERY WITH YOUR REGULAR TOOTHPASTE  DENTURES WILL BE REMOVED PRIOR TO SURGERY PLEASE DO NOT APPLY "Poly grip" OR ADHESIVES!!!   Do NOT smoke after Midnight   Take these medicines the morning of surgery with A SIP OF WATER:  Clonazepam if needed            Compazine or Zofran if needed  Tramadol or Hydrocodone if needed                                    You may not have any metal on your body including hair pins, jewelry, and body piercing             Do not wear make-up, lotions, powders, perfumes/cologne, or deodorant  Do not wear nail polish including gel and S&S, artificial/acrylic nails, or any other type of covering on natural nails including finger and toenails. If you have artificial nails, gel coating, etc. that needs to be removed by a nail salon please have this removed prior to surgery or surgery may need to be canceled/ delayed if the surgeon/ anesthesia feels like they are unable to be safely monitored.   Do not shave  48 hours prior to surgery.    Do not bring valuables to the hospital. [place] IS NOT             RESPONSIBLE   FOR VALUABLES.   Contacts, glasses, dentures or bridgework may not be worn into surgery.   Bring small overnight bag day of surgery.   DO NOT BRING YOUR HOME MEDICATIONS TO THE HOSPITAL. PHARMACY WILL DISPENSE MEDICATIONS LISTED ON YOUR MEDICATION LIST TO YOU DURING YOUR ADMISSION IN THE HOSPITAL!    Patients discharged on the day of surgery will not be allowed to drive home.  Someone NEEDS to stay with you for the first 24 hours after anesthesia.   Special Instructions: Bring a copy of your healthcare power of attorney and living will documents the day of surgery if you haven't scanned them before.              Please read over the following fact sheets you were given: IF YOU HAVE QUESTIONS ABOUT YOUR PRE-OP INSTRUCTIONS PLEASE CALL [phone]   If you  received a COVID test during your pre-op visit  it is requested that you wear a mask when out in public, stay away from anyone that may not be feeling well and notify your surgeon if you develop symptoms. If you test positive for Covid or have been in contact with anyone that has tested positive in the last 10 days please notify you surgeon.    [place] - Preparing for Surgery Before surgery, you can play an important role.  Because skin is not sterile, your skin needs to be as free of germs as possible.  You can reduce the number of germs on your skin by washing with CHG (chlorahexidine gluconate) soap before surgery.  CHG is an antiseptic cleaner which kills germs and bonds with the skin to continue killing germs even after washing. Please DO NOT use if you have an allergy to CHG or antibacterial soaps.  If your skin becomes reddened/irritated stop using the CHG and inform your nurse when you arrive at Short Stay. Do not shave (including legs and underarms) for at least 48 hours prior to the first CHG shower.  You may shave your face/neck.  Please follow these instructions carefully:  1.  Shower with CHG Soap the night before surgery and the  morning of surgery.  2.  If you choose to wash your hair, wash your hair first as usual with your normal  shampoo.  3.  After you shampoo, rinse your hair and body thoroughly to remove the shampoo.  4.  Use CHG as you would any other liquid soap.  You can apply chg directly to the skin and wash.  Gently with a scrungie or clean washcloth.  5.  Apply the CHG Soap to your body ONLY FROM THE NECK DOWN.   Do   not use on face/ open                           Wound or open sores. Avoid contact with eyes, ears mouth and   genitals (private parts).                       Wash face,  Genitals (private parts) with your normal soap.             6.  Wash thoroughly, paying special attention to the area where your    surgery  will be  performed.  7.  Thoroughly rinse your body with warm water from the neck down.  8.  DO NOT shower/wash with your normal soap after using and rinsing off the CHG Soap.                9.  Pat yourself dry with a clean towel.            10.  Wear clean pajamas.            11.  Place clean sheets on your bed the night of your first shower and do not  sleep with pets. Day of Surgery : Do not apply any lotions/deodorants the morning of surgery.  Please wear clean clothes to the hospital/surgery center.  FAILURE TO FOLLOW THESE INSTRUCTIONS MAY RESULT IN THE CANCELLATION OF YOUR SURGERY  PATIENT SIGNATURE_________________________________  NURSE SIGNATURE__________________________________  ________________________________________________________________________  WHAT IS A BLOOD TRANSFUSION? Blood Transfusion Information  A transfusion is the replacement of blood or some of its parts. Blood is made up of multiple cells which provide different functions. Red blood cells carry oxygen and are used for blood loss replacement. White blood cells fight against infection. Platelets control bleeding. Plasma helps clot blood. Other blood products are available for specialized needs, such as hemophilia or other clotting disorders. BEFORE THE TRANSFUSION  Who gives blood for transfusions?  Healthy volunteers who are fully evaluated to make sure their blood is safe. This is blood bank blood. Transfusion therapy is the safest it has ever been in the practice of medicine. Before blood is taken from a donor, a complete history is taken to make sure that person has no history of diseases nor engages in risky social behavior (examples are intravenous drug use or sexual activity with multiple partners). The donor's travel history is screened to minimize risk of transmitting infections, such as malaria. The donated blood is tested for signs of infectious diseases, such as HIV and hepatitis. The blood is then tested  to be sure it is compatible with you in order to minimize the chance of a transfusion reaction. If you or a relative donates blood, this is often done in anticipation of surgery and is not appropriate for emergency situations. It takes many days to process the donated blood. RISKS AND COMPLICATIONS Although transfusion therapy is very safe and saves many lives, the main dangers of transfusion include:  Getting an infectious disease. Developing a transfusion reaction. This is an allergic reaction to something in the blood you were given. Every precaution is taken to prevent this. The decision to  have a blood transfusion has been considered carefully by your caregiver before blood is given. Blood is not given unless the benefits outweigh the risks. AFTER THE TRANSFUSION Right after receiving a blood transfusion, you will usually feel much better and more energetic. This is especially true if your red blood cells have gotten low (anemic). The transfusion raises the level of the red blood cells which carry oxygen, and this usually causes an energy increase. The nurse administering the transfusion will monitor you carefully for complications. HOME CARE INSTRUCTIONS  No special instructions are needed after a transfusion. You may find your energy is better. Speak with your caregiver about any limitations on activity for underlying diseases you may have. SEEK MEDICAL CARE IF:  Your condition is not improving after your transfusion. You develop redness or irritation at the intravenous (IV) site. SEEK IMMEDIATE MEDICAL CARE IF:  Any of the following symptoms occur over the next 12 hours: Shaking chills. You have a temperature by mouth above 102 F (38.9 C), not controlled by medicine. Chest, back, or muscle pain. People around you feel you are not acting correctly or are confused. Shortness of breath or difficulty breathing. Dizziness and fainting. You get a rash or develop hives. You have a decrease  in urine output. Your urine turns a dark color or changes to pink, red, or brown. Any of the following symptoms occur over the next 10 days: You have a temperature by mouth above 102 F (38.9 C), not controlled by medicine. Shortness of breath. Weakness after normal activity. The white part of the eye turns yellow (jaundice). You have a decrease in the amount of urine or are urinating less often. Your urine turns a dark color or changes to pink, red, or brown. Document Released: 09/12/2000 Document Revised: 12/08/2011 Document Reviewed: 05/01/2008 [facility] Patient Information 2014 [place], [place].  _______________________________________________________________________

## 2023-01-26 NOTE — Telephone Encounter (Signed)
Called [facility] and she would like to move her surgery to 01/29/23.  Advised her that we will call her back with what time she will need to be there.

## 2023-01-28 ENCOUNTER — Encounter (HOSPITAL_COMMUNITY)
Admission: RE | Admit: 2023-01-28 | Discharge: 2023-01-28 | Disposition: A | Discharge status: Home / Self Care | Payer: Medicare Other | Source: Ambulatory Visit | Attending: Gynecologic Oncology | Admitting: Gynecologic Oncology

## 2023-01-28 ENCOUNTER — Telehealth: Payer: Self-pay | Admitting: *Deleted

## 2023-01-28 ENCOUNTER — Other Ambulatory Visit: Payer: Self-pay

## 2023-01-28 DIAGNOSIS — N83201 Unspecified ovarian cyst, right side: Secondary | ICD-10-CM

## 2023-01-28 DIAGNOSIS — Z01818 Encounter for other preprocedural examination: Secondary | ICD-10-CM | POA: Insufficient documentation

## 2023-01-28 DIAGNOSIS — C19 Malignant neoplasm of rectosigmoid junction: Secondary | ICD-10-CM

## 2023-01-28 LAB — CBC WITH DIFFERENTIAL/PLATELET
Abs Immature Granulocytes: 0.01 10*3/uL (ref 0.00–0.07)
Basophils Absolute: 0 10*3/uL (ref 0.0–0.1)
Basophils Relative: 0 %
Eosinophils Absolute: 0.1 10*3/uL (ref 0.0–0.5)
Eosinophils Relative: 2 %
HCT: 43.5 % (ref 36.0–46.0)
Hemoglobin: 14.3 g/dL (ref 12.0–15.0)
Immature Granulocytes: 0 %
Lymphocytes Relative: 24 %
Lymphs Abs: 1.2 10*3/uL (ref 0.7–4.0)
MCH: 31.2 pg (ref 26.0–34.0)
MCHC: 32.9 g/dL (ref 30.0–36.0)
MCV: 94.8 fL (ref 80.0–100.0)
Monocytes Absolute: 0.4 10*3/uL (ref 0.1–1.0)
Monocytes Relative: 9 %
Neutro Abs: 3.2 10*3/uL (ref 1.7–7.7)
Neutrophils Relative %: 65 %
Platelets: 120 10*3/uL — ABNORMAL LOW (ref 150–400)
RBC: 4.59 MIL/uL (ref 3.87–5.11)
RDW: 12.5 % (ref 11.5–15.5)
WBC: 5 10*3/uL (ref 4.0–10.5)
nRBC: 0 % (ref 0.0–0.2)

## 2023-01-28 LAB — TYPE AND SCREEN: ABO/RH(D): O POS

## 2023-01-28 LAB — COMPREHENSIVE METABOLIC PANEL
ALT: 22 U/L (ref 0–44)
AST: 21 U/L (ref 15–41)
Albumin: 4 g/dL (ref 3.5–5.0)
Alkaline Phosphatase: 46 U/L (ref 38–126)
Anion gap: 8 (ref 5–15)
BUN: 13 mg/dL (ref 6–20)
CO2: 26 mmol/L (ref 22–32)
Calcium: 8.7 mg/dL — ABNORMAL LOW (ref 8.9–10.3)
Chloride: 103 mmol/L (ref 98–111)
Creatinine, Ser: 0.73 mg/dL (ref 0.44–1.00)
GFR, Estimated: >60 mL/min (ref >60)
Glucose, Bld: 80 mg/dL (ref 70–99)
Potassium: 3.6 mmol/L (ref 3.5–5.1)
Sodium: 137 mmol/L (ref 135–145)
Total Bilirubin: 0.3 mg/dL (ref 0.3–1.2)
Total Protein: 6.5 g/dL (ref 6.5–8.1)

## 2023-01-28 NOTE — Anesthesia Preprocedure Evaluation (Signed)
Anesthesia Evaluation  Patient identified by MRN, date of birth, ID band Patient awake    Reviewed: Allergy & Precautions, NPO status , Patient's Chart, lab work & pertinent test results  Airway Mallampati: II  TM Distance: >3 FB Neck ROM: Full    Dental no notable dental hx.    Pulmonary former smoker   Pulmonary exam normal        Cardiovascular negative cardio ROS Normal cardiovascular exam     Neuro/Psych  Headaches PSYCHIATRIC DISORDERS Anxiety Depression     Neuromuscular disease    GI/Hepatic negative GI ROS, Neg liver ROS,,,  Endo/Other  negative endocrine ROS    Renal/GU negative Renal ROS     Musculoskeletal negative musculoskeletal ROS (+)    Abdominal   Peds  Hematology negative hematology ROS (+)   Anesthesia Other Findings OVARIAN CYST ABNORMAL UTERINE BLEEDING  Reproductive/Obstetrics                             Anesthesia Physical Anesthesia Plan  ASA: 2  Anesthesia Plan: General   Post-op Pain Management:    Induction: Intravenous  PONV Risk Score and Plan: 4 or greater and Ondansetron, Dexamethasone, Midazolam, Scopolamine patch - Pre-op, Propofol infusion and Treatment may vary due to age or medical condition  Airway Management Planned: Oral ETT  Additional Equipment:   Intra-op Plan:   Post-operative Plan: Extubation in OR  Informed Consent: I have reviewed the patients History and Physical, chart, labs and discussed the procedure including the risks, benefits and alternatives for the proposed anesthesia with the patient or authorized representative who has indicated his/her understanding and acceptance.     Dental advisory given  Plan Discussed with: CRNA  Anesthesia Plan Comments:         Anesthesia Quick Evaluation

## 2023-01-28 NOTE — Telephone Encounter (Signed)
Patient notified to confirm her surgery scheduled for tomorrow May 2nd with Dr. Pricilla Holm at [facility]. Pt to arrive at 0515. NPO after midnight, clear liquids only up until 0415. Reviewed instructions with patient. Pt understands and has no further questions at this time.

## 2023-01-28 NOTE — Progress Notes (Signed)
Khloe from pharmacy was unavailable to do medication reconciliation with patient at time of pre surgical testing appointment. I gave her the patients information and she stated she would call her once she was available. I also gave the patient Khloe's information.

## 2023-01-29 ENCOUNTER — Ambulatory Visit (HOSPITAL_COMMUNITY)
Admission: RE | Admit: 2023-01-29 | Discharge: 2023-01-29 | Disposition: A | Discharge status: Home / Self Care | Payer: Medicare Other | Attending: Gynecologic Oncology | Admitting: Gynecologic Oncology

## 2023-01-29 ENCOUNTER — Ambulatory Visit (HOSPITAL_COMMUNITY): Payer: Medicare Other | Admitting: Registered Nurse

## 2023-01-29 ENCOUNTER — Encounter (HOSPITAL_COMMUNITY): Payer: Self-pay | Admitting: Gynecologic Oncology

## 2023-01-29 ENCOUNTER — Encounter (HOSPITAL_COMMUNITY)
Admission: RE | Disposition: A | Discharge status: Home / Self Care | Payer: Self-pay | Source: Home / Self Care | Attending: Gynecologic Oncology

## 2023-01-29 ENCOUNTER — Encounter: Payer: Self-pay | Admitting: Hematology

## 2023-01-29 ENCOUNTER — Other Ambulatory Visit: Payer: Self-pay

## 2023-01-29 DIAGNOSIS — N939 Abnormal uterine and vaginal bleeding, unspecified: Secondary | ICD-10-CM

## 2023-01-29 DIAGNOSIS — K660 Peritoneal adhesions (postprocedural) (postinfection): Secondary | ICD-10-CM | POA: Insufficient documentation

## 2023-01-29 DIAGNOSIS — Z01818 Encounter for other preprocedural examination: Secondary | ICD-10-CM

## 2023-01-29 DIAGNOSIS — N83201 Unspecified ovarian cyst, right side: Secondary | ICD-10-CM

## 2023-01-29 DIAGNOSIS — N938 Other specified abnormal uterine and vaginal bleeding: Secondary | ICD-10-CM | POA: Diagnosis present

## 2023-01-29 DIAGNOSIS — N838 Other noninflammatory disorders of ovary, fallopian tube and broad ligament: Secondary | ICD-10-CM | POA: Diagnosis not present

## 2023-01-29 DIAGNOSIS — Z87891 Personal history of nicotine dependence: Secondary | ICD-10-CM | POA: Insufficient documentation

## 2023-01-29 DIAGNOSIS — C19 Malignant neoplasm of rectosigmoid junction: Secondary | ICD-10-CM

## 2023-01-29 DIAGNOSIS — Z85038 Personal history of other malignant neoplasm of large intestine: Secondary | ICD-10-CM | POA: Diagnosis not present

## 2023-01-29 DIAGNOSIS — N8302 Follicular cyst of left ovary: Secondary | ICD-10-CM | POA: Diagnosis not present

## 2023-01-29 DIAGNOSIS — R102 Pelvic and perineal pain: Secondary | ICD-10-CM | POA: Diagnosis not present

## 2023-01-29 HISTORY — DX: Headache, unspecified: R51.9

## 2023-01-29 HISTORY — PX: ROBOTIC ASSISTED TOTAL HYSTERECTOMY WITH BILATERAL SALPINGO OOPHERECTOMY: SHX6086

## 2023-01-29 LAB — TYPE AND SCREEN: Antibody Screen: NEGATIVE

## 2023-01-29 LAB — POCT PREGNANCY, URINE: Preg Test, Ur: NEGATIVE

## 2023-01-29 SURGERY — HYSTERECTOMY, TOTAL, ROBOT-ASSISTED, LAPAROSCOPIC, WITH BILATERAL SALPINGO-OOPHORECTOMY
Anesthesia: General | Laterality: Left

## 2023-01-29 MED ORDER — ONDANSETRON HCL 4 MG/2ML IJ SOLN
INTRAMUSCULAR | Status: AC
  Filled 2023-01-29: qty 2

## 2023-01-29 MED ORDER — PROPOFOL 10 MG/ML IV BOLUS
INTRAVENOUS | Status: AC
  Filled 2023-01-29: qty 20

## 2023-01-29 MED ORDER — DEXAMETHASONE SODIUM PHOSPHATE 10 MG/ML IJ SOLN
INTRAMUSCULAR | Status: DC | PRN
  Administered 2023-01-29: 8 mg via INTRAVENOUS

## 2023-01-29 MED ORDER — ESTRADIOL 0.1 MG/24HR TD PTWK: 0.1000 mg | MEDICATED_PATCH | TRANSDERMAL | 12 refills | Status: DC

## 2023-01-29 MED ORDER — EPHEDRINE SULFATE-NACL 50-0.9 MG/10ML-% IV SOSY
PREFILLED_SYRINGE | INTRAVENOUS | Status: DC | PRN
  Administered 2023-01-29: 10 mg via INTRAVENOUS
  Administered 2023-01-29: 5 mg via INTRAVENOUS
  Administered 2023-01-29 (×2): 10 mg via INTRAVENOUS

## 2023-01-29 MED ORDER — DEXAMETHASONE SODIUM PHOSPHATE 10 MG/ML IJ SOLN
INTRAMUSCULAR | Status: AC
  Filled 2023-01-29: qty 1

## 2023-01-29 MED ORDER — FENTANYL CITRATE (PF) 100 MCG/2ML IJ SOLN
INTRAMUSCULAR | Status: DC | PRN
  Administered 2023-01-29: 50 ug via INTRAVENOUS
  Administered 2023-01-29: 25 ug via INTRAVENOUS
  Administered 2023-01-29: 50 ug via INTRAVENOUS
  Administered 2023-01-29: 75 ug via INTRAVENOUS

## 2023-01-29 MED ORDER — BUPIVACAINE LIPOSOME 1.3 % IJ SUSP
INTRAMUSCULAR | Status: AC
  Filled 2023-01-29: qty 20

## 2023-01-29 MED ORDER — PROPOFOL 10 MG/ML IV BOLUS
INTRAVENOUS | Status: DC | PRN
  Administered 2023-01-29: 150 mg via INTRAVENOUS
  Administered 2023-01-29: 25 ug/kg/min via INTRAVENOUS

## 2023-01-29 MED ORDER — FENTANYL CITRATE PF 50 MCG/ML IJ SOSY
PREFILLED_SYRINGE | INTRAMUSCULAR | Status: AC
  Filled 2023-01-29: qty 3

## 2023-01-29 MED ORDER — LACTATED RINGERS IV SOLN: INTRAVENOUS | Status: DC

## 2023-01-29 MED ORDER — HEPARIN SODIUM (PORCINE) 5000 UNIT/ML IJ SOLN
5000.0000 [IU] | INTRAMUSCULAR | Status: AC
  Administered 2023-01-29: 5000 [IU] via SUBCUTANEOUS
  Filled 2023-01-29: qty 1

## 2023-01-29 MED ORDER — PROMETHAZINE HCL 25 MG/ML IJ SOLN: 6.2500 mg | INTRAMUSCULAR | Status: DC | PRN

## 2023-01-29 MED ORDER — PHENYLEPHRINE 80 MCG/ML (10ML) SYRINGE FOR IV PUSH (FOR BLOOD PRESSURE SUPPORT): PREFILLED_SYRINGE | INTRAVENOUS | Status: DC | PRN

## 2023-01-29 MED ORDER — AMISULPRIDE (ANTIEMETIC) 5 MG/2ML IV SOLN: 10.0000 mg | Freq: Once | INTRAVENOUS | Status: DC | PRN

## 2023-01-29 MED ORDER — ONDANSETRON HCL 4 MG/2ML IJ SOLN
INTRAMUSCULAR | Status: DC | PRN
  Administered 2023-01-29: 4 mg via INTRAVENOUS

## 2023-01-29 MED ORDER — LIDOCAINE 2% (20 MG/ML) 5 ML SYRINGE
INTRAMUSCULAR | Status: DC | PRN
  Administered 2023-01-29: 100 mg via INTRAVENOUS
  Administered 2023-01-29: 1.5 mg/kg/h via INTRAVENOUS

## 2023-01-29 MED ORDER — KETOROLAC TROMETHAMINE 15 MG/ML IJ SOLN: 15.0000 mg | INTRAMUSCULAR | Status: DC

## 2023-01-29 MED ORDER — ROCURONIUM BROMIDE 10 MG/ML (PF) SYRINGE
PREFILLED_SYRINGE | INTRAVENOUS | Status: AC
  Filled 2023-01-29: qty 10

## 2023-01-29 MED ORDER — KETOROLAC TROMETHAMINE 30 MG/ML IJ SOLN
INTRAMUSCULAR | Status: AC
  Filled 2023-01-29: qty 1

## 2023-01-29 MED ORDER — ACETAMINOPHEN 500 MG PO TABS
1000.0000 mg | ORAL_TABLET | ORAL | Status: AC
  Administered 2023-01-29: 1000 mg via ORAL
  Filled 2023-01-29: qty 2

## 2023-01-29 MED ORDER — BUPIVACAINE HCL 0.25 % IJ SOLN
INTRAMUSCULAR | Status: DC | PRN
  Administered 2023-01-29: 20 mL

## 2023-01-29 MED ORDER — BUPIVACAINE LIPOSOME 1.3 % IJ SUSP
INTRAMUSCULAR | Status: DC | PRN
  Administered 2023-01-29: 20 mL

## 2023-01-29 MED ORDER — KETOROLAC TROMETHAMINE 30 MG/ML IJ SOLN
30.0000 mg | Freq: Once | INTRAMUSCULAR | Status: AC | PRN
  Administered 2023-01-29: 30 mg via INTRAVENOUS

## 2023-01-29 MED ORDER — MIDAZOLAM HCL 5 MG/5ML IJ SOLN
INTRAMUSCULAR | Status: DC | PRN
  Administered 2023-01-29: 2 mg via INTRAVENOUS

## 2023-01-29 MED ORDER — PHENYLEPHRINE HCL-NACL 20-0.9 MG/250ML-% IV SOLN
INTRAVENOUS | Status: DC | PRN
  Administered 2023-01-29: 25 ug/min via INTRAVENOUS

## 2023-01-29 MED ORDER — FENTANYL CITRATE (PF) 100 MCG/2ML IJ SOLN
INTRAMUSCULAR | Status: AC
  Filled 2023-01-29: qty 2

## 2023-01-29 MED ORDER — LACTATED RINGERS IV SOLN: INTRAVENOUS | Status: DC | PRN

## 2023-01-29 MED ORDER — SUCCINYLCHOLINE CHLORIDE 200 MG/10ML IV SOSY: PREFILLED_SYRINGE | INTRAVENOUS | Status: DC | PRN

## 2023-01-29 MED ORDER — ONDANSETRON HCL 4 MG/2ML IJ SOLN
4.0000 mg | Freq: Once | INTRAMUSCULAR | Status: AC
  Administered 2023-01-29: 4 mg via INTRAVENOUS

## 2023-01-29 MED ORDER — LIDOCAINE HCL (PF) 2 % IJ SOLN
INTRAMUSCULAR | Status: AC
  Filled 2023-01-29: qty 15

## 2023-01-29 MED ORDER — PROPOFOL 1000 MG/100ML IV EMUL
INTRAVENOUS | Status: AC
  Filled 2023-01-29: qty 100

## 2023-01-29 MED ORDER — EPHEDRINE 5 MG/ML INJ
INTRAVENOUS | Status: AC
  Filled 2023-01-29: qty 5

## 2023-01-29 MED ORDER — DEXAMETHASONE SODIUM PHOSPHATE 4 MG/ML IJ SOLN: 4.0000 mg | INTRAMUSCULAR | Status: DC

## 2023-01-29 MED ORDER — FENTANYL CITRATE PF 50 MCG/ML IJ SOSY
25.0000 ug | PREFILLED_SYRINGE | INTRAMUSCULAR | Status: DC | PRN
  Administered 2023-01-29: 50 ug via INTRAVENOUS

## 2023-01-29 MED ORDER — SUGAMMADEX SODIUM 200 MG/2ML IV SOLN
INTRAVENOUS | Status: DC | PRN
  Administered 2023-01-29: 150 mg via INTRAVENOUS

## 2023-01-29 MED ORDER — CEFAZOLIN SODIUM-DEXTROSE 2-4 GM/100ML-% IV SOLN
2.0000 g | INTRAVENOUS | Status: AC
  Administered 2023-01-29: 2 g via INTRAVENOUS
  Filled 2023-01-29: qty 100

## 2023-01-29 MED ORDER — SCOPOLAMINE 1 MG/3DAYS TD PT72
1.0000 | MEDICATED_PATCH | TRANSDERMAL | Status: DC
  Administered 2023-01-29: 1.5 mg via TRANSDERMAL
  Filled 2023-01-29: qty 1

## 2023-01-29 MED ORDER — BUPIVACAINE HCL 0.25 % IJ SOLN
INTRAMUSCULAR | Status: AC
  Filled 2023-01-29: qty 1

## 2023-01-29 MED ORDER — ROCURONIUM BROMIDE 10 MG/ML (PF) SYRINGE
PREFILLED_SYRINGE | INTRAVENOUS | Status: DC | PRN
  Administered 2023-01-29: 50 mg via INTRAVENOUS
  Administered 2023-01-29: 20 mg via INTRAVENOUS
  Administered 2023-01-29: 10 mg via INTRAVENOUS

## 2023-01-29 MED ORDER — OXYCODONE HCL 5 MG PO TABS: 5.0000 mg | ORAL_TABLET | Freq: Once | ORAL | Status: DC | PRN

## 2023-01-29 MED ORDER — CHLORHEXIDINE GLUCONATE 0.12 % MT SOLN
15.0000 mL | Freq: Once | OROMUCOSAL | Status: AC
  Administered 2023-01-29: 15 mL via OROMUCOSAL

## 2023-01-29 MED ORDER — STERILE WATER FOR IRRIGATION IR SOLN
Status: DC | PRN
  Administered 2023-01-29: 1000 mL

## 2023-01-29 MED ORDER — LACTATED RINGERS IR SOLN
Status: DC | PRN
  Administered 2023-01-29: 1000 mL

## 2023-01-29 MED ORDER — OXYCODONE HCL 5 MG/5ML PO SOLN: 5.0000 mg | Freq: Once | ORAL | Status: DC | PRN

## 2023-01-29 MED ORDER — MIDAZOLAM HCL 2 MG/2ML IJ SOLN
INTRAMUSCULAR | Status: AC
  Filled 2023-01-29: qty 2

## 2023-01-29 MED ORDER — GABAPENTIN 300 MG PO CAPS
300.0000 mg | ORAL_CAPSULE | ORAL | Status: AC
  Administered 2023-01-29: 300 mg via ORAL
  Filled 2023-01-29: qty 1

## 2023-01-29 SURGICAL SUPPLY — 78 items
ADH SKN CLS APL DERMABOND .7 (GAUZE/BANDAGES/DRESSINGS) ×1
AGENT HMST KT MTR STRL THRMB (HEMOSTASIS)
APL ESCP 34 STRL LF DISP (HEMOSTASIS)
APPLICATOR SURGIFLO ENDO (HEMOSTASIS) IMPLANT
BAG COUNTER SPONGE SURGICOUNT (BAG) IMPLANT
BAG LAPAROSCOPIC 12 15 PORT 16 (BASKET) IMPLANT
BAG RETRIEVAL 12/15 (BASKET)
BAG SPNG CNTER NS LX DISP (BAG)
BLADE SURG SZ10 CARB STEEL (BLADE) IMPLANT
COVER BACK TABLE 60X90IN (DRAPES) ×1 IMPLANT
COVER TIP SHEARS 8 DVNC (MISCELLANEOUS) ×1 IMPLANT
DERMABOND ADVANCED .7 DNX12 (GAUZE/BANDAGES/DRESSINGS) ×1 IMPLANT
DRAPE ARM DVNC X/XI (DISPOSABLE) ×4 IMPLANT
DRAPE COLUMN DVNC XI (DISPOSABLE) ×1 IMPLANT
DRAPE SHEET LG 3/4 BI-LAMINATE (DRAPES) ×1 IMPLANT
DRAPE SURG IRRIG POUCH 19X23 (DRAPES) ×1 IMPLANT
DRIVER NDL MEGA 8 DVNC XI (INSTRUMENTS) ×2 IMPLANT
DRIVER NDLE MEGA DVNC XI (INSTRUMENTS) ×1 IMPLANT
DRSG OPSITE POSTOP 4X6 (GAUZE/BANDAGES/DRESSINGS) IMPLANT
DRSG OPSITE POSTOP 4X8 (GAUZE/BANDAGES/DRESSINGS) IMPLANT
ELECT PENCIL ROCKER SW 15FT (MISCELLANEOUS) IMPLANT
ELECT REM PT RETURN 15FT ADLT (MISCELLANEOUS) ×1 IMPLANT
FORCEPS BPLR FENES DVNC XI (FORCEP) ×1 IMPLANT
FORCEPS PROGRASP DVNC XI (FORCEP) ×1 IMPLANT
GAUZE 4X4 16PLY ~~LOC~~+RFID DBL (SPONGE) ×2 IMPLANT
GLOVE BIO SURGEON STRL SZ 6 (GLOVE) ×4 IMPLANT
GLOVE BIO SURGEON STRL SZ 6.5 (GLOVE) ×1 IMPLANT
GOWN STRL REUS W/ TWL LRG LVL3 (GOWN DISPOSABLE) ×4 IMPLANT
GOWN STRL REUS W/TWL LRG LVL3 (GOWN DISPOSABLE) ×5
GRASPER SUT TROCAR 14GX15 (MISCELLANEOUS) IMPLANT
HIBICLENS CHG 4% 4OZ BTL (MISCELLANEOUS) ×2 IMPLANT
HOLDER FOLEY CATH W/STRAP (MISCELLANEOUS) IMPLANT
IRRIG SUCT STRYKERFLOW 2 WTIP (MISCELLANEOUS) ×1
IRRIGATION SUCT STRKRFLW 2 WTP (MISCELLANEOUS) ×1 IMPLANT
KIT PROCEDURE DVNC SI (MISCELLANEOUS) IMPLANT
KIT TURNOVER KIT A (KITS) IMPLANT
LIGASURE IMPACT 36 18CM CVD LR (INSTRUMENTS) IMPLANT
MANIPULATOR ADVINCU DEL 3.0 PL (MISCELLANEOUS) IMPLANT
MANIPULATOR ADVINCU DEL 3.5 PL (MISCELLANEOUS) IMPLANT
MANIPULATOR UTERINE 4.5 ZUMI (MISCELLANEOUS) IMPLANT
NDL HYPO 21X1.5 SAFETY (NEEDLE) ×1 IMPLANT
NDL SPNL 18GX3.5 QUINCKE PK (NEEDLE) IMPLANT
NEEDLE HYPO 21X1.5 SAFETY (NEEDLE) ×1 IMPLANT
NEEDLE SPNL 18GX3.5 QUINCKE PK (NEEDLE) IMPLANT
OBTURATOR OPTICAL STND 8 DVNC (TROCAR) ×1
OBTURATOR OPTICALSTD 8 DVNC (TROCAR) ×1 IMPLANT
PACK ROBOT GYN CUSTOM WL (TRAY / TRAY PROCEDURE) ×1 IMPLANT
PAD POSITIONING PINK XL (MISCELLANEOUS) ×1 IMPLANT
PORT ACCESS TROCAR AIRSEAL 12 (TROCAR) IMPLANT
SCISSORS MNPLR CVD DVNC XI (INSTRUMENTS) ×1 IMPLANT
SEAL UNIV 5-12 XI (MISCELLANEOUS) ×3 IMPLANT
SET TRI-LUMEN FLTR TB AIRSEAL (TUBING) ×1 IMPLANT
SPIKE FLUID TRANSFER (MISCELLANEOUS) ×1 IMPLANT
SPONGE T-LAP 18X18 ~~LOC~~+RFID (SPONGE) IMPLANT
SURGIFLO W/THROMBIN 8M KIT (HEMOSTASIS) IMPLANT
SUT MNCRL AB 4-0 PS2 18 (SUTURE) IMPLANT
SUT PDS AB 1 TP1 96 (SUTURE) IMPLANT
SUT V-LOC 180 0-0 GS22 (SUTURE) IMPLANT
SUT VIC AB 0 CT1 27 (SUTURE)
SUT VIC AB 0 CT1 27XBRD ANTBC (SUTURE) IMPLANT
SUT VIC AB 2-0 CT1 27 (SUTURE)
SUT VIC AB 2-0 CT1 TAPERPNT 27 (SUTURE) IMPLANT
SUT VICRYL 0 27 CT2 27 ABS (SUTURE) ×1 IMPLANT
SUT VICRYL 4-0 PS2 18IN ABS (SUTURE) ×2 IMPLANT
SUT VLOC 180 0 9IN  GS21 (SUTURE)
SUT VLOC 180 0 9IN GS21 (SUTURE) IMPLANT
SYR 10ML LL (SYRINGE) IMPLANT
SYS BAG RETRIEVAL 10MM (BASKET)
SYS WOUND ALEXIS 18CM MED (MISCELLANEOUS)
SYSTEM BAG RETRIEVAL 10MM (BASKET) IMPLANT
SYSTEM WOUND ALEXIS 18CM MED (MISCELLANEOUS) IMPLANT
TOWEL OR NON WOVEN STRL DISP B (DISPOSABLE) IMPLANT
TRAP SPECIMEN MUCUS 40CC (MISCELLANEOUS) IMPLANT
TRAY FOLEY MTR SLVR 16FR STAT (SET/KITS/TRAYS/PACK) ×1 IMPLANT
TROCAR PORT AIRSEAL 5X120 (TROCAR) IMPLANT
UNDERPAD 30X36 HEAVY ABSORB (UNDERPADS AND DIAPERS) ×2 IMPLANT
WATER STERILE IRR 1000ML POUR (IV SOLUTION) ×1 IMPLANT
YANKAUER SUCT BULB TIP 10FT TU (MISCELLANEOUS) IMPLANT

## 2023-01-29 NOTE — Discharge Instructions (Addendum)
AFTER SURGERY INSTRUCTIONS   Return to work: 4-6 weeks if applicable  Do not take tramadol and hydrocodone/APAP together.  Plan to begin using an estrogen patch 1 week after surgery.  On frozen section, the pathologist did NOT see cancer in the ovary but we will await the results of your final pathology.   Activity: 1. Be up and out of the bed during the day.  Take a nap if needed.  You may walk up steps but be careful and use the hand rail.  Stair climbing will tire you more than you think, you may need to stop part way and rest.    2. No lifting or straining for 6 weeks over 10 pounds. No pushing, pulling, straining for 6 weeks.   3. No driving for around 1 week(s).  Do not drive if you are taking narcotic pain medicine and make sure that your reaction time has returned.    4. You can shower as soon as the next day after surgery. Shower daily.  Use your regular soap and water (not directly on the incision) and pat your incision(s) dry afterwards; don't rub.  No tub baths or submerging your body in water until cleared by your surgeon. If you have the soap that was given to you by pre-surgical testing that was used before surgery, you do not need to use it afterwards because this can irritate your incisions.    5. No sexual activity and nothing in the vagina for 10-12 weeks.   6. You may experience a small amount of clear drainage from your incisions, which is normal.  If the drainage persists, increases, or changes color please call the office.   7. Do not use creams, lotions, or ointments such as neosporin on your incisions after surgery until advised by your surgeon because they can cause removal of the dermabond glue on your incisions.     8. You may experience vaginal spotting after surgery or around the 6-8 week mark from surgery when the stitches at the top of the vagina begin to dissolve.  The spotting is normal but if you experience heavy bleeding, call our office.   9. Take  Tylenol or ibuprofen first for pain if you are able to take these medications and only use narcotic pain medication for severe pain not relieved by the Tylenol or Ibuprofen.  Monitor your Tylenol intake to a max of 4,000 mg in a 24 hour period. You can alternate these medications after surgery.   Hydrocodone/APAP has tylenol in it so monitor your intake of tylenol.   Diet: 1. Low sodium Heart Healthy Diet is recommended but you are cleared to resume your normal (before surgery) diet after your procedure.   2. It is safe to use a laxative, such as Miralax or Colace, if you have difficulty moving your bowels. You have been prescribed Sennakot-S to take at bedtime every evening after surgery to keep bowel movements regular and to prevent constipation.     Wound Care: 1. Keep clean and dry.  Shower daily.   Reasons to call the Doctor: Fever - Oral temperature greater than 100.4 degrees Fahrenheit Foul-smelling vaginal discharge Difficulty urinating Nausea and vomiting Increased pain at the site of the incision that is unrelieved with pain medicine. Difficulty breathing with or without chest pain New calf pain especially if only on one side Sudden, continuing increased vaginal bleeding with or without clots.   Contacts: For questions or concerns you should contact:   Dr. Natalia Leatherwood  Pricilla Holm at [phone]   Warner Mccreedy, NP at [phone]   After Hours: call [phone] and have the GYN Oncologist paged/contacted (after 5 pm or on the weekends). You will speak with an after hours RN and let he or she know you have had surgery.   Messages sent via mychart are for non-urgent matters and are not responded to after hours so for urgent needs, please call the after hours number.

## 2023-01-29 NOTE — Anesthesia Procedure Notes (Signed)
Procedure Name: Intubation Date/Time: 01/29/2023 7:32 AM  Performed by: Elisabeth Cara, CRNAPre-anesthesia Checklist: Patient identified, Emergency Drugs available, Suction available, Patient being monitored and Timeout performed Patient Re-evaluated:Patient Re-evaluated prior to induction Oxygen Delivery Method: Circle system utilized Preoxygenation: Pre-oxygenation with 100% oxygen Induction Type: IV induction Ventilation: Mask ventilation without difficulty Laryngoscope Size: Mac and 4 Grade View: Grade I Tube type: Oral Tube size: 7.5 mm Number of attempts: 1 Airway Equipment and Method: Stylet Placement Confirmation: ETT inserted through vocal cords under direct vision, positive ETCO2 and breath sounds checked- equal and bilateral Secured at: 22 cm Tube secured with: Tape Dental Injury: Teeth and Oropharynx as per pre-operative assessment

## 2023-01-29 NOTE — Op Note (Signed)
OPERATIVE NOTE  Pre-operative Diagnosis: History of colon cancer, recent complex adnexal cyst, pelvic pain, abnormal uterine bleeding on progesterone therapy.  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  On intra-abdominal entry, normal upper abdominal survey.  No ascites.  Adhesions of the omentum as well as the descending colon to the left abdominal sidewall and left anterior abdominal wall.  Adhesions of the cecum to the right abdominal sidewall.  Right tube and ovary surgically absent.  Left ovary 3-4 cm with a small, 1-2 cm smooth cystic lesion.  Fallopian tube normal in appearance.  Left ovary with significant adhesions to the left pelvic sidewall and sigmoid mesentery.  Uterus 6 cm and normal in appearance.  No ascites. Specimen sent for frozen section.  Left ovary showed benign ovarian parenchyma.  Estimated Blood Loss:  75 cc      Total IV Fluids: see I&O flowsheet         Specimens: uterus, cervix, left tube and ovary, pelvic washings         Complications:  None apparent; patient tolerated the procedure well.         Disposition: PACU - hemodynamically stable.  Procedure Details  The patient was seen in the Holding Room. The risks, benefits, complications, treatment options, and expected outcomes were discussed with the patient.  The patient concurred with the proposed plan, giving informed consent.  The site of surgery properly noted/marked. The patient was identified as Stephanie [name] and the procedure verified as a Robotic-assisted hysterectomy with unilateral salpingo oophorectomy.   After induction of  anesthesia, the patient was draped and prepped in the usual sterile manner. Patient was placed in supine position after anesthesia and draped and prepped in the usual sterile manner as follows: Her arms were tucked to her side with all appropriate precautions.  The patient was secured to the bed using padding and tape across her chest.  The patient was placed in the semi-lithotomy position in [place] stirrups.  The perineum and vagina were prepped with Betadine. The patient's abdomen was prepped with ChloraPrep and then she was draped after the prep had been allowed to dry for 3 minutes.  A Time Out was held and the above information confirmed.  The urethra was prepped with Betadine. Foley catheter was placed.  A sterile speculum was placed in the vagina.  The cervix was grasped with a single-tooth tenaculum. The cervix was dilated with Shawnie Pons dilators.  The ZUMI uterine manipulator with a medium colpotomizer ring was placed without difficulty.  A pneum occluder balloon was placed over the manipulator.  OG tube placement was confirmed and to suction.   Next, a 5 mm skin incision was made 1 cm below the subcostal margin in the midclavicular line.  The 5 mm Optiview port and scope was used for direct entry.  Opening pressure was under 10 mm CO2.  The abdomen was insufflated and the findings were noted as above.   At this point and all points during the procedure, the patient's intra-abdominal pressure did not exceed 15 mmHg. Next, an 8 mm skin incision was made superior to the umbilicus and two ports were placed on the right, one 8 cm lateral to the umbilical port and  a second 8 cm from the mid-right abdominal port.  The 5 mm assist trocar was exchanged for a 5 mm airseal port. All ports were placed under direct visualization.  The patient was placed in steep Trendelenburg.  The robot was docked in the normal manner.  Attention was turned to the left.  Some filmy adhesions between the omentum and anterior abdominal  wall along the left mid abdomen were lysed sharply.  Filmy adhesions between the sigmoid colon and abdominal wall were also lysed although given denser adhesions more laterally with good view of the pelvic organs, further lysis of adhesions was not undertaken.    The right and left peritoneum were opened parallel to the IP ligament to open the retroperitoneal spaces bilaterally. The round ligaments were transected.   On the left, combination of sharp dissection and short bursts of monopolar electrocautery were used to mobilize the sigmoid colon from the left pelvic sidewall and inferior aspect of the ovary.  During this dissection, the ureter and internal iliac artery were identified.  The peritoneum above the ureter was incised along the medial leaf of the broad ligament and the infundibulopelvic ligament was skeletonized, cauterized, and cut.    With upward traction on the distal end of the infundibulopelvic ligament, combination of sharp dissection, blunt dissection, and short burst of electrocautery were used to free the ovary from the medial leaf of the broad ligament and inferior adhesions to the sigmoid mesentery.  More laterally, the course of the ureter was followed until the adnexal structures could be easily reflected superiorly.  On the right, the medial leaf of the broad ligament was incised superior to the ureter, which was easily identified transperitoneally.  The posterior peritoneum was taken down to the level of the KOH ring.  The anterior peritoneum was also taken down.  The bladder flap was created to the level of the KOH ring.  The uterine artery on the right side was skeletonized, cauterized and cut in the normal manner.  A similar procedure was performed on the left.  The colpotomy was made and the uterus, cervix, bilateral ovaries and tubes were amputated and delivered through the vagina.  Pedicles were inspected and excellent hemostasis was achieved.    The colpotomy at the  vaginal cuff was closed with 0 Vicryl with a figure of eight at each apex and 0 V-Lock to close the midportion of the cuff in a running manner.  Irrigation was used and excellent hemostasis was achieved.  At this point in the procedure was completed.  Robotic instruments were removed under direct visulaization.  Exparel was injected for local anesthesia. The robot was undocked. The subcuticular tissue was closed with 4-0 Vicryl and the skin was closed with 4-0 Monocryl in a subcuticular manner.  Dermabond was applied.    The vagina was swabbed with minimal bleeding noted. Foley catheter was removed.  All sponge, lap and needle counts were correct x  3.   The patient was transferred to the recovery room in stable condition.  Eugene Garnet, MD

## 2023-01-29 NOTE — H&P (Signed)
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 Redemonstrated hypoenhancing lesion of the posterior liver dome, hepatic segment VII, reduced in size compared to prior examination, measuring 1.3 x 1.3 cm, previously 1.8 x 1.7 cm. Findings are consistent with treatment response of a biopsy proven metastasis. No other evidence of lymphadenopathy or metastatic disease within the abdomen or pelvis. 2. Unchanged mild splenomegaly, maximum coronal span 14.0 cm. 3. 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   03/12/2020 Initial Diagnosis   Malignant neoplasm of rectosigmoid junction (HCC)   03/12/2020 Imaging   CT AP with contrast IMPRESSION: 1. Overall findings are highly concerning for colorectal carcinoma involving the sigmoid colon with an associated perforation and adjacent abscess and phlegmon formation as detailed above. Currently, no collection is amenable to percutaneous drainage given their small size and location. 2. New 2 cm mass in the right hepatic lobe concerning for metastatic disease to the liver until proven otherwise. 3. Enlarged regional lymph nodes as detailed above is concerning for nodal metastatic disease. 4. Large stool burden. 5. Prominent pelvic veins which can be seen in patients with pelvic congestion syndrome.   03/13/2020 Imaging   ABD US IMPRESSION: Approximately 2.1 x 2.4 x 2.0 cm lobular homogeneously echogenic lesion in the right hepatic dome corresponds with the abnormality seen on the prior CT scan. Sonographically, this appearance is highly suggestive of a benign  hemangioma.   Recommend MRI of the abdomen with gadolinium contrast which may provide a noninvasive diagnosis of benign hemangioma.    03/13/2020 Imaging   MR ABD W/WO CONTRAST Hepatobiliary: Diffuse low signal intensity throughout the hepatic parenchyma on T2 weighted images, presumably a consequence of recent Feraheme injection. In segment 7 of the liver (axial image 8 of series 5) there is a 2.5 x 1.9 cm well-defined lesion which is slightly T2 hyperintense. This lesion appears hyperintense on pre gadolinium T1 weighted images (likely a consequence of Feraheme). Interpretation of enhancement within the lesion is compromised by presence of Feraheme. No other hepatic lesions are confidently identified on today's examination. No intra or extrahepatic biliary ductal dilatation. Gallbladder is normal in appearance.   03/31/2020 Imaging   CT AP W contrast IMPRESSION: 1. Previously noted sigmoid colon mass appears increased in size, and again appears to be associated with a focal contained perforation which crosses the midline and has fistulized into the left adnexal region where there is now what appears to be a large left tubo-ovarian abscess, as detailed above. This is also associated with multiple enlarged lymph nodes in the pelvis measuring up to 1.2 cm in short axis and borderline enlarged retroperitoneal lymph nodes, concerning for metastatic disease. In addition, previously suspected metastatic lesion in segment 7 of the liver has enlarged. 2. Small volume of ascites. 3. Additional incidental findings, as above.   04/04/2020 Cancer Staging   Staging form: Colon and Rectum, AJCC 8th Edition - Pathologic stage from 04/04/2020: pT4a, pN1b, cM1 - Signed by Pollyann Samples, NP on 05/07/2020   04/04/2020 Procedure   Paracentesis, path showed no malignant cells (mixed acute and chronic inflammation present)   04/04/2020 Surgery   Open sigmoid colectomy and end  colostomy by Dr. Almond Lint    04/04/2020 Pathology Results   FINAL MICROSCOPIC DIAGNOSIS: A. COLON, RECTOSIGMOID, RESECTION: - Invasive moderately differentiated adenocarcinoma, 6 cm, involving rectosigmoid junction - Carcinoma invades into serosal surface with perforation and associated serositis - Radial resection margin is positive for carcinoma; proximal and distal margins are not involved - Lymphovascular invasion is present - Metastatic carcinoma to one of fifteen lymph nodes (1/15); one tumor deposit - See oncology table B. LYMPH NODES, MESENTERIC, RESECTION: - Metastatic adenocarcinoma to one of six lymph nodes (1/6) - One tumor deposit  Addendum to note 2 involved lymph nodes (of 21 examined nodes) pT4a,pN1b MMR-normal, preserved expression of MLH1, MSH2, MSH6, PMS2   04/12/2020 Procedure   PAC placement    04/13/2020 Imaging   CT chest without contrast IMPRESSION: Interval development of bilateral pleural effusions, left slightly greater than right, with resultant bibasilar atelectasis including subtotal collapse of the left lower lobe. No evidence of intrathoracic metastatic disease, though evaluation of the collapsed parenchyma is limited. Hepatic metastasis again demonstrated.     04/16/2020 Pathology Results   FINAL MICROSCOPIC DIAGNOSIS:  A. LIVER, RIGHT LOBE, BIOPSY:  - Adenocarcinoma.  COMMENT:  The morphology is compatible with the provided clinical history of colorectal carcinoma.    05/23/2020 - 10/24/2020 Chemotherapy   FOLFIRINOX q2weeks for 3-6 months starting 05/23/20. Bevacizumab-bvzr Omer Jack) added with C2. Oxaliplatin held with C11-12 due to reaction. (pt developed SOB, chest palpitation and abdominal discomfort shortly after oxaliplatin started). Completed on 10/24/20.   08/05/2020 Imaging   CT AP  IMPRESSION: 1. Postsurgical changes of distal colectomy with a left lower quadrant end ostomy. No evidence of obstruction or acute complication at this time. Excluded rectal pouch in the  deep pelvis without acute complication or worrisome features. 2. Slight interval decrease in size of a hypoattenuating lesion posterior right lobe liver measuring 1.7 x 1.8 x 2 cm. This lesion has previously undergone ultrasound-guided biopsy with pathologic results demonstrating adenocarcinoma compatible with metastatic disease from patient's resected colorectal carcinoma. 3. Slight prominence of the parametrial vessels bilaterally, nonspecific though can be seen in the setting of pelvic congestion syndrome. 4. Mild splenomegaly.  No focal lesion.     11/12/2020 Imaging   CT Chest  IMPRESSION: 1. No evidence of metastatic disease in the chest. 2. 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   12/13/2020 Surgery   A. 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 The report date is 01/28/2021.   The CustomNext-Cancer+RNAinsight panel offered by [facility] included sequencing and rearrangement analysis for the following 47 genes:  APC, ATM, AXIN2, BARD1, BMPR1A, BRCA1, BRCA2, BRIP1, CDH1, CDK4, CDKN2A, CHEK2, DICER1, EPCAM, GREM1, HOXB13, MEN1, MLH1, MSH2, MSH3, MSH6, MUTYH, NBN, NF1, NF2, NTHL1, PALB2, PMS2, POLD1, POLE, PTEN, RAD51C, RAD51D, RECQL, RET, SDHA, SDHAF2, SDHB, SDHC, SDHD, SMAD4, SMARCA4, STK11, TP53, TSC1, TSC2, and VHL.  RNA data is routinely analyzed for use in variant interpretation for all genes.   02/19/2021 Imaging   CT A/P w/o contrast  IMPRESSION: Slightly limited examination examination in absence of contrast administration.  Status post partial right hepatectomy. Interval  decrease in size in perihepatic fluid collection and resolution of right subdiaphragmatic fluid and gas. No new intra-abdominal fluid collections are identified.   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 Moderate stool throughout the colon without evidence of obstruction.   Fluid distension of the proximal duodenum to the level of the SMA hiatus which appears narrow. 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   05/05/2021 Imaging   CT AP  IMPRESSION: Postsurgical changes as described stable in appearance from the prior exam.   Changes suggestive of mild pelvic varices.   08/22/2021 Imaging   EXAM: CT ABDOMEN AND PELVIS WITH CONTRAST  IMPRESSION: 1. Extensive bowel content is identified throughout the colon consistent with constipation. 2. 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 3. Stable postoperative changes in the right lobe liver.   09/12/2021 Survivorship   SCP delivered by Santiago Glad, NP   10/05/2021 Imaging   EXAM: CT ABDOMEN AND PELVIS WITH CONTRAST  IMPRESSION: 1. Right ovarian/adnexal cyst, 5.2 cm. Because this lesion is not adequately characterized, prompt [place] is recommended for further evaluation. Note: This recommendation does not apply to premenarchal patients and to those with increased risk (genetic, family history, elevated tumor markers or other high-risk factors) of ovarian cancer. Reference: JACR 2020 Feb; 17(2):248-254 2. No other evidence of an acute abnormality within the abdomen or pelvis. 3. Moderate increase in the colonic and rectal stool burden. No bowel obstruction or inflammation. 4. No evidence of locally recurrent or metastatic colon carcinoma.   11/12/2021 Imaging   EXAM: CT CHEST WITHOUT  CONTRAST  IMPRESSION: 1. No evidence of pulmonary metastatic disease. 2. Stable postoperative findings about the posterior right lobe of the liver.   03/28/2022 - 04/18/2022 Chemotherapy   Patient is on Treatment Plan : COLORECTAL Xelox (Capeox) q21d     05/23/2022 - 05/23/2022 Chemotherapy   Patient is on Treatment Plan : COLORECTAL CAPEOX (130/850) q21d x 8 cycles       Interval History: Doing well.  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 Malachy Mood, oncology @ [facility].   Colon polyps    Colostomy present (HCC)    03-2020, reversed 2022   Family history of bladder cancer    Headache    History of chemotherapy    ended 09-2020, again in 2023, see 01/19/23 OV note in Epic by Dr. Malachy Mood.   Lactose intolerance 03/12/2020   Neuromuscular disorder (HCC)    neuropathy feet hands legs   Ovarian cyst 01/16/2023   3.2 cm left ovarian cyst , see 01/16/23 Pelvic US in Epic   UTI (lower urinary tract infection)     Past Surgical History:  Procedure Laterality Date   CESAREAN SECTION  2010   CHOLECYSTECTOMY  12/13/2020   COLECTOMY  03/2020   COLONOSCOPY     11/20/22 &03/18/21   COLOSTOMY TAKEDOWN N/A 05/30/2021  Procedure: LAPAROSCOPIC ASSISTED COLOSTOMY TAKEDOWN;  Surgeon: Almond Lint, MD;  Location: [facility] OR;  Service: General;  Laterality: N/A;   CYSTOSCOPY WITH STENT PLACEMENT  04/04/2020   Procedure: CYSTOSCOPY WITH STENT PLACEMENT;  Surgeon: Almond Lint, MD;  Location: [facility] ORS;  Service: General;;    LAPAROSCOPIC LIVER ULTRASOUND N/A 12/13/2020   Procedure: INTRAOPERATIVE LIVER ULTRASOUND;  Surgeon: Almond Lint, MD;  Location: [facility] OR;  Service: General;  Laterality: N/A;   LAPAROSCOPY N/A 12/13/2020   Procedure: LAPAROSCOPY DIAGNOSTIC;  Surgeon: Almond Lint, MD;  Location: [facility] OR;  Service: General;  Laterality: N/A;   LAPAROTOMY N/A 04/04/2020   Procedure: EXPLORATORY LAPAROTOMY;  Surgeon: Almond Lint, MD;  Location: [facility];  Service: General;  Laterality: N/A;   OPEN PARTIAL HEPATECTOMY  N/A 12/13/2020   Procedure: OPEN PARTIAL HEPATECTOMY;  Surgeon: Almond Lint, MD;  Location: [facility] OR;  Service: General;  Laterality: N/A;  ROOM 2 STARTING AT 09:30AM FOR 300 MIN   PORTACATH PLACEMENT Right 04/12/2020   Procedure: INSERTION PORT-A-CATH WITH ULTRASOUND;  Surgeon: Kinsinger, De Blanch, MD;  Location: [facility];  Service: General;  Laterality: Right;    Family History  Problem Relation Age of Onset   Irritable bowel syndrome Mother    Heart disease Father    Stroke Father    Aneurysm Father    Irritable bowel syndrome Brother    Other Brother        gluten intolerance   Bladder Cancer Maternal Grandmother        dx 90s   Colon cancer Neg Hx    Esophageal cancer Neg Hx    Colon polyps Neg Hx    Stomach cancer Neg Hx    Rectal cancer Neg Hx    Breast cancer Neg Hx    Ovarian cancer Neg Hx    Endometrial cancer Neg Hx    Pancreatic cancer Neg Hx    Prostate cancer Neg Hx     Social History   Socioeconomic History   Marital status: Married    Spouse name: Not on file   Number of children: 1   Years of education: Not on file   Highest education level: Not on file  Occupational History   Occupation: hair stylist  Tobacco Use   Smoking status: Former    Packs/day: 0.50    Years: 10.00    Additional pack years: 0.00    Total pack years: 5.00    Types: Cigarettes    Quit date: 2018    Years since quitting: 6.3   Smokeless tobacco: Never  Vaping Use   Vaping Use:  Never used  Substance and Sexual Activity   Alcohol use: Not Currently   Drug use: No   Sexual activity: Yes    Partners: Male    Birth control/protection: None  Other Topics Concern   Not on file  Social History Narrative   Not on file   Social Determinants of Health   Financial Resource Strain: High Risk (05/09/2022)   Overall Financial Resource Strain (CARDIA)    Difficulty of Paying Living Expenses: Very hard  Food Insecurity: Food Insecurity Present (05/09/2022)   Hunger Vital Sign    Worried About Running Out of Food in the Last Year: Sometimes true    Ran Out of Food in the Last Year: Sometimes true  Transportation Needs: Unmet Transportation Needs (05/09/2022)   PRAPARE - Administrator, Civil Service (Medical): No    Lack of Transportation (Non-Medical): Yes  Physical  Activity: Not on file  Stress: Stress Concern Present (05/09/2022)   [employer] of Occupational Health - Occupational Stress Questionnaire    Feeling of Stress : Very much  Social Connections: Moderately Isolated (05/09/2022)   Social Connection and Isolation Panel [NHANES]    Frequency of Communication with Friends and Family: More than three times a week    Frequency of Social Gatherings with Friends and Family: More than three times a week    Attends Religious Services: 1 to 4 times per year    Active Member of [employer] or Organizations: No    Attends Engineer, structural: Never    Marital Status: Never married    Current Medications:  Current Facility-Administered Medications:    ceFAZolin (ANCEF) IVPB 2g/100 mL premix, 2 g, Intravenous, On Call to OR, Cross, Melissa D, NP   dexamethasone (DECADRON) injection 4 mg, 4 mg, Intravenous, On Call to OR, Cross, Melissa D, NP   ketorolac (TORADOL) 15 MG/ML injection 15 mg, 15 mg, Intravenous, On Call to OR, Cross, Melissa D, NP   lactated ringers infusion, , Intravenous, Continuous, Mal Amabile, MD, Last Rate: 50 mL/hr at  01/29/23 0643, Continued from Pre-op at 01/29/23 1610   lactated ringers infusion, , Intravenous, Continuous, Collene Schlichter, MD   ondansetron [facility]) 4 MG/2ML injection, , , ,    scopolamine (TRANSDERM-SCOP) 1 MG/3DAYS 1.5 mg, 1 patch, Transdermal, On Call to OR, Cross, Melissa D, NP, 1.5 mg at 01/29/23 0607  Physical Exam: BP 98/74   Pulse (!) 59   Temp 97.9 F (36.6 C) (Oral)   Resp 16   Ht 5\' 8"  (1.727 m)   Wt 158 lb 0.7 oz (71.7 kg)   SpO2 96%   BMI 24.03 kg/m  General: Alert, oriented, no acute distress.  HEENT: Normocephalic, atraumatic. Sclera anicteric.  Chest: Clear to auscultation bilaterally. No wheezes, rhonchi, or rales. Cardiovascular: Regular rate and rhythm, no murmurs, rubs, or gallops.  Abdomen: Normoactive bowel sounds. Soft, nondistended, nontender to palpation. No masses or hepatosplenomegaly appreciated. No palpable fluid wave.  Multiple well-healed abdominal scars. Extremities: Grossly normal range of motion. Warm, well perfused. No edema bilaterally.   Laboratory & Radiologic Studies:    Latest Ref Rng & Units 01/28/2023    9:18 AM 01/19/2023   11:01 AM 12/19/2022   12:41 PM  CBC  WBC 4.0 - 10.5 K/uL 5.0  3.7  5.1   Hemoglobin 12.0 - 15.0 g/dL 96.0  45.4  09.8   Hematocrit 36.0 - 46.0 % 43.5  42.8  44.0   Platelets 150 - 400 K/uL 120  121  122       Latest Ref Rng & Units 01/28/2023    9:18 AM 01/19/2023   11:01 AM 12/19/2022   12:41 PM  BMP  Glucose 70 - 99 mg/dL 80  119  90   BUN 6 - 20 mg/dL 13  10  15    Creatinine 0.44 - 1.00 mg/dL 1.47  8.29  5.62   Sodium 135 - 145 mmol/L 137  139  138   Potassium 3.5 - 5.1 mmol/L 3.6  4.0  4.4   Chloride 98 - 111 mmol/L 103  107  105   CO2 22 - 32 mmol/L 26  28  28    Calcium 8.9 - 10.3 mg/dL 8.7  9.5  9.4    Pelvic ultrasound 01/16/23: 1.7 cm uterine fibroid.   Endometrium thickness within normal limits. If bleeding remains unresponsive to hormonal or medical therapy,  sonohysterogram should be considered  for focal lesion work-up. (Ref: Radiological Reasoning: Algorithmic Workup of Abnormal Vaginal Bleeding with Endovaginal Sonography and Sonohysterography. AJR 2008; 161:W96-04).   3.2 cm complex left ovarian cyst most consistent with hemorrhagic cyst. Short-interval follow up ultrasound in 6-12 weeks is recommended, preferably during the week following the patient's normal menses.  Pelvic ultrasound 12/20/22: 2.0 cm diameter intramural leiomyoma upper uterus.   Surgical absence of RIGHT ovary.   Resolution of cystic lesion in LEFT ovary seen on prior CT.  Assessment & Plan: Stephanie [name] is a 42 y.o. woman with history of colon cancer, most recently with recurrence to the right ovary now with intermittent left ovarian cyst. After long consideration, the patient desires to proceed with definitive surgery with USO/TH.  We reviewed the plan for a robotic assisted hysterectomy, unilateral salpingo-oophorectomy, possible laparotomy, any other indicated procedures. The risks of surgery were discussed in detail and she understands these to include infection; wound separation; hernia; vaginal cuff separation, injury to adjacent organs such as bowel, bladder, blood vessels, ureters and nerves; bleeding which may require blood transfusion; anesthesia risk; thromboembolic events; possible death; unforeseen complications; possible need for re-exploration; medical complications such as heart attack, stroke, pleural effusion and pneumonia. The patient will receive DVT and antibiotic prophylaxis as indicated. She voiced a clear understanding. She had the opportunity to ask questions.  Eugene Garnet, MD  Division of Gynecologic Oncology  Department of Obstetrics and Gynecology  [facility]

## 2023-01-29 NOTE — Brief Op Note (Signed)
01/29/2023  10:46 AM  PATIENT:  Stephanie [name]  42 y.o. female  PRE-OPERATIVE DIAGNOSIS:  OVARIAN CYST, ABNORMAL UTERINE BLEEDING  POST-OPERATIVE DIAGNOSIS:  OVARIAN CYST, ABNORMAL UTERINE BLEEDING  PROCEDURE:  Procedure(s): XI ROBOTIC ASSISTED TOTAL HYSTERECTOMY WITH LEFT SALPINGO OOPHORECTOMY; LYSIS OF ADHESIONS (Left)  SURGEON:  Surgeon(s) and Role:    Carver Fila, MD - Primary  ASSISTANTS: Warner Mccreedy, NP   ANESTHESIA:   general  EBL:  75 mL   BLOOD ADMINISTERED:none  DRAINS: none   LOCAL MEDICATIONS USED:  MARCAINE, exparel     SPECIMEN:  washings, uterus, cervix, left tube and ovary  DISPOSITION OF SPECIMEN:  PATHOLOGY  COUNTS:  YES  TOURNIQUET:  * No tourniquets in log *  DICTATION: .Note written in EPIC  PLAN OF CARE: Discharge to home after PACU  PATIENT DISPOSITION:  PACU - hemodynamically stable.   Delay start of Pharmacological VTE agent (>24hrs) due to surgical blood loss or risk of bleeding: not applicable

## 2023-01-29 NOTE — Anesthesia Postprocedure Evaluation (Signed)
Anesthesia Post Note  Patient: Rowyn Mustapha   Procedure(s) Performed: XI ROBOTIC ASSISTED TOTAL HYSTERECTOMY WITH LEFT SALPINGO OOPHORECTOMY; LYSIS OF ADHESIONS (Left)     Patient location during evaluation: PACU Anesthesia Type: General Level of consciousness: awake Pain management: pain level controlled Vital Signs Assessment: post-procedure vital signs reviewed and stable Respiratory status: spontaneous breathing, nonlabored ventilation and respiratory function stable Cardiovascular status: blood pressure returned to baseline and stable Postop Assessment: no apparent nausea or vomiting Anesthetic complications: no   No notable events documented.  Last Vitals:  Vitals:   01/29/23 1130 01/29/23 1150  BP: 115/73 114/74  Pulse: (!) 58 65  Resp: 14 14  Temp:  36.6 C  SpO2: 96% 100%    Last Pain:  Vitals:   01/29/23 1150  TempSrc:   PainSc: 0-No pain                 Burgandy Hackworth P Danyelle Brookover

## 2023-01-29 NOTE — Transfer of Care (Signed)
Immediate Anesthesia Transfer of Care Note  Patient: Stephanie [name]   Procedure(s) Performed: XI ROBOTIC ASSISTED TOTAL HYSTERECTOMY WITH LEFT SALPINGO OOPHORECTOMY; LYSIS OF ADHESIONS (Left)  Patient Location: PACU  Anesthesia Type:General  Level of Consciousness: awake, alert , oriented, and patient cooperative  Airway & Oxygen Therapy: Patient Spontanous Breathing and Patient connected to face mask oxygen  Post-op Assessment: Report given to RN, Post -op Vital signs reviewed and stable, and Patient moving all extremities  Post vital signs: Reviewed and stable  Last Vitals:  Vitals Value Taken Time  BP 121/70 01/29/23 1023  Temp    Pulse 79 01/29/23 1025  Resp 13 01/29/23 1025  SpO2 100 % 01/29/23 1025  Vitals shown include unvalidated device data.  Last Pain:  Vitals:   01/29/23 0626  TempSrc: Oral  PainSc:          Complications: No notable events documented.

## 2023-01-30 ENCOUNTER — Telehealth: Payer: Self-pay | Admitting: *Deleted

## 2023-01-30 ENCOUNTER — Encounter (HOSPITAL_COMMUNITY): Payer: Self-pay | Admitting: Gynecologic Oncology

## 2023-01-30 DIAGNOSIS — Z01818 Encounter for other preprocedural examination: Secondary | ICD-10-CM

## 2023-01-30 DIAGNOSIS — C19 Malignant neoplasm of rectosigmoid junction: Secondary | ICD-10-CM

## 2023-01-30 DIAGNOSIS — N83201 Unspecified ovarian cyst, right side: Secondary | ICD-10-CM

## 2023-01-30 LAB — CYTOLOGY - NON PAP

## 2023-01-30 LAB — SURGICAL PATHOLOGY

## 2023-01-30 NOTE — Telephone Encounter (Signed)
Spoke with Stephanie [name] this morning. She states she is eating, drinking and urinating well. She has not had a BM yet but is passing gas. She is taking senokot as prescribed and encouraged her to drink plenty of water. She denies fever or chills. Incisions are dry and intact. She rates her pain 4/10. Her pain is controlled with Hydrocodone. States only having "tiny" vaginal spotting that is pink.  Instructed to call office with any fever, chills, purulent drainage, uncontrolled pain or any other questions or concerns. Patient verbalizes understanding.   Pt aware of post op appointments as well as the office number [phone] and after hours number [phone] to call if she has any questions or concerns

## 2023-02-02 ENCOUNTER — Telehealth: Payer: Self-pay | Admitting: Psychiatry

## 2023-02-02 NOTE — Telephone Encounter (Signed)
Delayed entry note.  Notified late Friday evening, 5/3, regarding pt call. Asked to be connected with pt. Pt reports pain following surgery and swelling across her abdomen near her incision. She is eating and drinking. Denies N/V, fevers or chills. She is passing gas. Pt last took hydrocodone 5.5 hours prior. Recommend that pt repeat hydrocodone. She can also add motrin 600mg  every 6 hours is she tolerates NSAIDs.  It pt is concerned she can present to ED for further evaluation, but no red flags from our conversation. Pt counseled that if she has nausea, vomiting, stops having flatus, or has fevers, then these are certain reasons to present to the ER. Suspect the "swelling" sensation is in part from the edema from surgery and CO2 gas. Encouraged ambulation in the detail. All questions answered.

## 2023-02-04 ENCOUNTER — Other Ambulatory Visit: Payer: Self-pay | Admitting: Hematology and Oncology

## 2023-02-05 ENCOUNTER — Telehealth: Payer: Self-pay

## 2023-02-05 ENCOUNTER — Other Ambulatory Visit: Payer: Self-pay | Admitting: Hematology

## 2023-02-05 ENCOUNTER — Other Ambulatory Visit: Payer: Self-pay

## 2023-02-05 DIAGNOSIS — N83201 Unspecified ovarian cyst, right side: Secondary | ICD-10-CM

## 2023-02-05 MED ORDER — HYDROCODONE-ACETAMINOPHEN 5-325 MG PO TABS
1.0000 | ORAL_TABLET | Freq: Four times a day (QID) | ORAL | 0 refills | Status: DC | PRN
Start: 2023-02-05 — End: 2023-02-25

## 2023-02-05 NOTE — Telephone Encounter (Signed)
Patient called wanting you to call in a refill for her hydrocodone she is out and still in pain from her hysterectomy last week. She called [facility] to refill but they couldn't because of you already writing the rx on 4/22 per patient.

## 2023-02-06 ENCOUNTER — Inpatient Hospital Stay: Payer: Medicare Other | Attending: Nurse Practitioner | Admitting: Gynecologic Oncology

## 2023-02-06 ENCOUNTER — Other Ambulatory Visit (HOSPITAL_COMMUNITY): Payer: BC Managed Care – PPO

## 2023-02-06 ENCOUNTER — Encounter: Payer: Medicare Other | Admitting: Gynecologic Oncology

## 2023-02-06 ENCOUNTER — Ambulatory Visit: Payer: Medicare Other | Admitting: Gynecologic Oncology

## 2023-02-06 ENCOUNTER — Encounter: Payer: Self-pay | Admitting: Gynecologic Oncology

## 2023-02-06 DIAGNOSIS — N939 Abnormal uterine and vaginal bleeding, unspecified: Secondary | ICD-10-CM

## 2023-02-06 DIAGNOSIS — R1030 Lower abdominal pain, unspecified: Secondary | ICD-10-CM

## 2023-02-06 DIAGNOSIS — E8940 Asymptomatic postprocedural ovarian failure: Secondary | ICD-10-CM

## 2023-02-06 DIAGNOSIS — N870 Mild cervical dysplasia: Secondary | ICD-10-CM

## 2023-02-06 DIAGNOSIS — Z90710 Acquired absence of both cervix and uterus: Secondary | ICD-10-CM

## 2023-02-06 DIAGNOSIS — N838 Other noninflammatory disorders of ovary, fallopian tube and broad ligament: Secondary | ICD-10-CM

## 2023-02-06 DIAGNOSIS — Z90722 Acquired absence of ovaries, bilateral: Secondary | ICD-10-CM

## 2023-02-06 MED ORDER — ESTRADIOL 0.1 MG/24HR TD PTTW
1.0000 | MEDICATED_PATCH | TRANSDERMAL | 12 refills | Status: AC
Start: 2023-02-09 — End: ?

## 2023-02-06 NOTE — Progress Notes (Signed)
Gynecologic Oncology Telehealth Note: Gyn-Onc  I connected with Felton Clinton on 02/06/23 at  4:30 PM EDT by telephone and verified that I am speaking with the correct person using two identifiers.  I discussed the limitations, risks, security and privacy concerns of performing an evaluation and management service by telemedicine and the availability of in-person appointments. I also discussed with the patient that there may be a patient responsible charge related to this service. The patient expressed understanding and agreed to proceed.  Other persons participating in the visit and their role in the encounter: none.  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 Redemonstrated hypoenhancing lesion of the posterior liver dome, hepatic segment VII, reduced in size compared to prior examination, measuring 1.3 x 1.3 cm, previously 1.8 x 1.7 cm. Findings are consistent with treatment response of a biopsy proven metastasis. No other evidence of lymphadenopathy or metastatic disease within the abdomen or pelvis. 2. Unchanged mild splenomegaly, maximum coronal span 14.0 cm. 3. 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   03/12/2020 Initial Diagnosis   Malignant neoplasm of rectosigmoid junction (HCC)   03/12/2020 Imaging   CT AP with contrast IMPRESSION: 1. Overall findings are highly concerning for colorectal carcinoma involving the sigmoid colon with an associated perforation and adjacent abscess and phlegmon formation as detailed above. Currently, no  collection is amenable to percutaneous drainage given their small size and location. 2. New 2 cm mass in the right hepatic lobe concerning for metastatic disease to the liver until proven otherwise. 3. Enlarged regional lymph nodes as detailed above is concerning for nodal metastatic disease. 4. Large stool burden. 5. Prominent pelvic veins which can be seen in patients with pelvic congestion syndrome.   03/13/2020 Imaging   ABD US IMPRESSION: Approximately 2.1 x 2.4 x 2.0 cm lobular homogeneously echogenic lesion in the right hepatic dome corresponds with the abnormality seen on the prior CT scan. Sonographically, this appearance is highly suggestive of a benign hemangioma.   Recommend MRI of the abdomen with gadolinium contrast which may provide a noninvasive diagnosis of benign hemangioma.    03/13/2020 Imaging   MR ABD W/WO CONTRAST Hepatobiliary: Diffuse low signal intensity throughout the hepatic parenchyma on T2 weighted images, presumably a consequence of recent Feraheme injection. In segment 7 of the liver (axial image 8 of series 5) there is a 2.5 x 1.9 cm well-defined lesion which is slightly T2 hyperintense. This lesion appears hyperintense on pre gadolinium T1 weighted images (likely a consequence of Feraheme). Interpretation of enhancement within the lesion is compromised by presence of Feraheme. No other hepatic lesions are confidently identified on today's examination. No intra or extrahepatic biliary ductal dilatation. Gallbladder is normal in appearance.   03/31/2020 Imaging   CT AP W contrast IMPRESSION: 1. Previously noted sigmoid colon mass appears increased in size, and again appears to be associated with a focal contained perforation which crosses the midline and has fistulized into the left adnexal region where there is now what appears to be a large left tubo-ovarian abscess, as detailed above. This is also associated with multiple enlarged lymph nodes in  the pelvis measuring up to 1.2 cm in short axis and borderline enlarged retroperitoneal lymph nodes, concerning for metastatic disease. In addition, previously suspected metastatic lesion in segment 7 of the liver has enlarged. 2. Small volume of ascites. 3. Additional incidental findings, as above.   04/04/2020 Cancer Staging   Staging form: Colon and Rectum, AJCC 8th Edition - Pathologic stage from 04/04/2020: pT4a, pN1b, cM1 - Signed by Pollyann Samples, NP on 05/07/2020   04/04/2020 Procedure   Paracentesis, path showed no malignant cells (mixed acute and chronic inflammation present)   04/04/2020 Surgery   Open sigmoid colectomy and end colostomy by Dr. Almond Lint   04/04/2020 Pathology Results   FINAL MICROSCOPIC DIAGNOSIS: A. COLON, RECTOSIGMOID, RESECTION: - Invasive moderately differentiated adenocarcinoma, 6 cm, involving rectosigmoid junction - Carcinoma invades into serosal surface with perforation and associated serositis - Radial resection margin is positive for carcinoma; proximal and distal margins are not involved - Lymphovascular invasion is present - Metastatic carcinoma to one of fifteen lymph nodes (1/15); one tumor deposit - See oncology table B. LYMPH NODES, MESENTERIC, RESECTION: - Metastatic adenocarcinoma to one of six lymph nodes (1/6) - One tumor deposit  Addendum to note 2 involved lymph nodes (of 21 examined nodes) pT4a,pN1b MMR-normal, preserved expression of MLH1, MSH2, MSH6, PMS2   04/12/2020 Procedure   PAC placement    04/13/2020 Imaging   CT chest without contrast IMPRESSION: Interval development of bilateral pleural effusions, left slightly greater than right, with resultant bibasilar atelectasis including subtotal collapse of the left lower lobe. No evidence of intrathoracic metastatic disease, though evaluation of the collapsed parenchyma is limited. Hepatic metastasis again demonstrated.     04/16/2020 Pathology Results   FINAL MICROSCOPIC  DIAGNOSIS:  A. LIVER, RIGHT LOBE, BIOPSY:  - Adenocarcinoma.  COMMENT:  The morphology is compatible with the provided clinical history of colorectal carcinoma.    05/23/2020 - 10/24/2020 Chemotherapy   FOLFIRINOX q2weeks for 3-6 months starting 05/23/20. Bevacizumab-bvzr Omer Jack) added with C2. Oxaliplatin held with C11-12 due to reaction. (pt developed SOB, chest palpitation and abdominal discomfort shortly after oxaliplatin started). Completed on 10/24/20.   08/05/2020 Imaging   CT AP  IMPRESSION: 1. Postsurgical changes of distal colectomy with a left lower quadrant end ostomy. No evidence of obstruction or acute complication at this time. Excluded rectal pouch in the deep pelvis without acute complication or worrisome features. 2. Slight interval decrease in size of a hypoattenuating lesion posterior right lobe liver measuring 1.7 x 1.8 x 2 cm. This lesion has previously undergone ultrasound-guided biopsy with pathologic results demonstrating adenocarcinoma compatible with metastatic disease from patient's resected colorectal carcinoma. 3. Slight prominence of the parametrial vessels bilaterally, nonspecific though can be seen in the setting of pelvic congestion syndrome. 4. Mild splenomegaly.  No focal lesion.     11/12/2020 Imaging   CT Chest  IMPRESSION: 1. No evidence of metastatic disease in the chest. 2. 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   12/13/2020 Surgery   A. 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 The report date is  01/28/2021.   The CustomNext-Cancer+RNAinsight  panel offered by [employer] included sequencing and rearrangement analysis for the following 47 genes:  APC, ATM, AXIN2, BARD1, BMPR1A, BRCA1, BRCA2, BRIP1, CDH1, CDK4, CDKN2A, CHEK2, DICER1, EPCAM, GREM1, HOXB13, MEN1, MLH1, MSH2, MSH3, MSH6, MUTYH, NBN, NF1, NF2, NTHL1, PALB2, PMS2, POLD1, POLE, PTEN, RAD51C, RAD51D, RECQL, RET, SDHA, SDHAF2, SDHB, SDHC, SDHD, SMAD4, SMARCA4, STK11, TP53, TSC1, TSC2, and VHL.  RNA data is routinely analyzed for use in variant interpretation for all genes.   02/19/2021 Imaging   CT A/P w/o contrast  IMPRESSION: Slightly limited examination examination in absence of contrast administration. Status post partial right hepatectomy. Interval decrease in size in perihepatic fluid collection and resolution of right subdiaphragmatic fluid and gas. No new intra-abdominal fluid collections are identified.   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 Moderate stool throughout the colon without evidence of obstruction.   Fluid distension of the proximal duodenum to the level of the SMA hiatus which appears narrow. 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   05/05/2021 Imaging   CT AP  IMPRESSION: Postsurgical changes as described stable in appearance from the prior exam.   Changes suggestive of mild pelvic varices.   08/22/2021 Imaging   EXAM: CT ABDOMEN AND PELVIS WITH CONTRAST  IMPRESSION: 1. Extensive bowel content is identified throughout the colon consistent with constipation. 2. 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 3. Stable postoperative changes in the right lobe liver.   09/12/2021 Survivorship   SCP delivered by Santiago Glad, NP   10/05/2021 Imaging   EXAM: CT ABDOMEN AND PELVIS WITH  CONTRAST  IMPRESSION: 1. Right ovarian/adnexal cyst, 5.2 cm. Because this lesion is not adequately characterized, prompt [place] is recommended for further evaluation. Note: This recommendation does not apply to premenarchal patients and to those with increased risk (genetic, family history, elevated tumor markers or other high-risk factors) of ovarian cancer. Reference: JACR 2020 Feb; 17(2):248-254 2. No other evidence of an acute abnormality within the abdomen or pelvis. 3. Moderate increase in the colonic and rectal stool burden. No bowel obstruction or inflammation. 4. No evidence of locally recurrent or metastatic colon carcinoma.   11/12/2021 Imaging   EXAM: CT CHEST WITHOUT CONTRAST  IMPRESSION: 1. No evidence of pulmonary metastatic disease. 2. Stable postoperative findings about the posterior right lobe of the liver.   03/28/2022 - 04/18/2022 Chemotherapy   Patient is on Treatment Plan : COLORECTAL Xelox (Capeox) q21d     05/23/2022 - 05/23/2022 Chemotherapy   Patient is on Treatment Plan : COLORECTAL CAPEOX (130/850) q21d x 8 cycles      01/29/23: Robotic-assisted laparoscopic total hysterectomy with left salpingo-oophorectomy, lysis of adhesions for approximately 40 minutes  Findings: On EUA, small mobile uterus, no adnexal masses appreciated.  On intra-abdominal entry, normal upper abdominal survey.  No ascites.  Adhesions of the omentum as well as the descending colon to the left abdominal sidewall and left anterior abdominal wall.  Adhesions of the cecum to the right abdominal sidewall.  Right tube and ovary surgically absent.  Left ovary 3-4 cm with a small, 1-2 cm smooth cystic lesion.  Fallopian tube normal in appearance.  Left ovary with significant adhesions to the left pelvic sidewall and sigmoid mesentery.  Uterus 6 cm and normal in appearance.  No ascites. Specimen sent for frozen section.  Left ovary showed benign ovarian parenchyma.  Interval History: Doing well, still has  some abdominal bloating and pelvic pain but overall improving.  Endorses normal bowel function, denies any urinary symptoms.  Has a good appetite, denies nausea or emesis.  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 Malachy Mood, oncology @ [facility].   Colon polyps    Colostomy present (HCC)    03-2020, reversed 2022   Family history of bladder cancer    Headache    History of chemotherapy    ended 09-2020, again in 2023, see 01/19/23 OV note in Epic by Dr. Malachy Mood.   Lactose intolerance 03/12/2020   Neuromuscular disorder (HCC)    neuropathy feet hands legs   Ovarian cyst 01/16/2023   3.2 cm left ovarian cyst , see 01/16/23 Pelvic US in Epic   UTI (lower urinary tract infection)     Past Surgical History:  Procedure Laterality Date   CESAREAN SECTION  2010   CHOLECYSTECTOMY  12/13/2020   COLECTOMY  03/2020   COLONOSCOPY     11/20/22 &03/18/21   COLOSTOMY TAKEDOWN N/A 05/30/2021   Procedure: LAPAROSCOPIC ASSISTED COLOSTOMY TAKEDOWN;  Surgeon: Almond Lint, MD;  Location: [facility] OR;  Service: General;  Laterality: N/A;   CYSTOSCOPY WITH STENT PLACEMENT  04/04/2020   Procedure: CYSTOSCOPY WITH STENT PLACEMENT;  Surgeon: Almond Lint, MD;  Location: [facility];  Service: General;;   LAPAROSCOPIC LIVER ULTRASOUND N/A 12/13/2020   Procedure: INTRAOPERATIVE LIVER ULTRASOUND;  Surgeon: Almond Lint, MD;  Location: [facility] OR;  Service: General;  Laterality: N/A;   LAPAROSCOPY N/A 12/13/2020   Procedure: LAPAROSCOPY DIAGNOSTIC;   Surgeon: Almond Lint, MD;  Location: [facility] OR;  Service: General;  Laterality: N/A;   LAPAROTOMY N/A 04/04/2020   Procedure: EXPLORATORY LAPAROTOMY;  Surgeon: Almond Lint, MD;  Location: [facility];  Service: General;  Laterality: N/A;   OPEN PARTIAL HEPATECTOMY  N/A 12/13/2020   Procedure: OPEN PARTIAL HEPATECTOMY;  Surgeon: Almond Lint, MD;  Location: [facility] OR;  Service: General;  Laterality: N/A;  ROOM 2 STARTING AT 09:30AM FOR 300 MIN   PORTACATH PLACEMENT Right 04/12/2020   Procedure: INSERTION PORT-A-CATH WITH ULTRASOUND;  Surgeon: Kinsinger, De Blanch, MD;  Location: [facility];  Service: General;  Laterality: Right;   ROBOTIC ASSISTED TOTAL HYSTERECTOMY WITH BILATERAL SALPINGO OOPHERECTOMY Left 01/29/2023   Procedure: XI ROBOTIC ASSISTED TOTAL HYSTERECTOMY WITH LEFT SALPINGO OOPHORECTOMY; LYSIS OF ADHESIONS;  Surgeon: Carver Fila, MD;  Location: [facility];  Service: Gynecology;  Laterality: Left;    Family History  Problem Relation Age of Onset   Irritable bowel syndrome Mother    Heart disease Father    Stroke Father    Aneurysm Father    Irritable bowel syndrome Brother    Other Brother        gluten intolerance   Bladder Cancer Maternal Grandmother        dx 90s   Colon cancer Neg Hx    Esophageal cancer Neg Hx    Colon polyps Neg Hx    Stomach cancer Neg Hx  Rectal cancer Neg Hx    Breast cancer Neg Hx    Ovarian cancer Neg Hx    Endometrial cancer Neg Hx    Pancreatic cancer Neg Hx    Prostate cancer Neg Hx     Social History   Socioeconomic History   Marital status: Married    Spouse name: Not on file   Number of children: 1   Years of education: Not on file   Highest education level: Not on file  Occupational History   Occupation: hair stylist  Tobacco Use   Smoking status: Former    Packs/day: 0.50    Years: 10.00    Additional pack years: 0.00    Total pack years: 5.00    Types: Cigarettes    Quit date: 2018    Years since quitting: 6.3   Smokeless  tobacco: Never  Vaping Use   Vaping Use: Never used  Substance and Sexual Activity   Alcohol use: Not Currently   Drug use: No   Sexual activity: Yes    Partners: Male    Birth control/protection: None  Other Topics Concern   Not on file  Social History Narrative   Not on file   Social Determinants of Health   Financial Resource Strain: High Risk (05/09/2022)   Overall Financial Resource Strain (CARDIA)    Difficulty of Paying Living Expenses: Very hard  Food Insecurity: Food Insecurity Present (05/09/2022)   Hunger Vital Sign    Worried About Running Out of Food in the Last Year: Sometimes true    Ran Out of Food in the Last Year: Sometimes true  Transportation Needs: Unmet Transportation Needs (05/09/2022)   PRAPARE - Transportation    Lack of Transportation (Medical): No    Lack of Transportation (Non-Medical): Yes  Physical Activity: Not on file  Stress: Stress Concern Present (05/09/2022)   [employer] of Occupational Health - Occupational Stress Questionnaire    Feeling of Stress : Very much  Social Connections: Moderately Isolated (05/09/2022)   Social Connection and Isolation Panel [NHANES]    Frequency of Communication with Friends and Family: More than three times a week    Frequency of Social Gatherings with Friends and Family: More than three times a week    Attends Religious Services: 1 to 4 times per year    Active Member of [employer] or Organizations: No    Attends Banker Meetings: Never    Marital Status: Never married    Current Medications:  Current Outpatient Medications:    [START ON 02/09/2023] estradiol (VIVELLE-DOT) 0.1 MG/24HR patch, Place 1 patch (0.1 mg total) onto the skin 2 (two) times a week., Disp: 8 patch, Rfl: 12   acetaminophen (TYLENOL) 325 MG tablet, Take 2 tablets (650 mg total) by mouth every 6 (six) hours as needed for mild pain, moderate pain, fever or headache (fever > 101). (Patient taking differently: Take 650 mg by  mouth every 6 (six) hours as needed for mild pain, moderate pain, fever or headache.), Disp: , Rfl:    buPROPion ER (WELLBUTRIN SR) 100 MG 12 hr tablet, Take 100 mg by mouth daily. (Patient not taking: Reported on 01/28/2023), Disp: , Rfl:    clonazePAM (KLONOPIN) 1 MG tablet, Take 1 mg by mouth 2 (two) times daily as needed for anxiety., Disp: , Rfl:    HYDROcodone-acetaminophen (NORCO/VICODIN) 5-325 MG tablet, Take 1 tablet by mouth every 6 (six) hours as needed for severe pain., Disp: 30 tablet, Rfl: 0  Multiple Vitamin (MULTIVITAMIN WITH MINERALS) TABS tablet, Take 1 tablet by mouth daily., Disp: , Rfl:    nitrofurantoin, macrocrystal-monohydrate, (MACROBID) 100 MG capsule, Take 100 mg by mouth daily as needed (sexual activity)., Disp: , Rfl:    ondansetron (ZOFRAN) 8 MG tablet, TAKE 1 TABLET(8 MG) BY MOUTH EVERY 8 HOURS AS NEEDED FOR NAUSEA OR VOMITING (Patient taking differently: Take 8 mg by mouth as needed for nausea or vomiting.), Disp: 30 tablet, Rfl: 2   Probiotic Product (PROBIOTIC PO), Take 2 capsules by mouth daily., Disp: , Rfl:    prochlorperazine (COMPAZINE) 10 MG tablet, Take 1 tablet (10 mg total) by mouth every 6 (six) hours as needed (Nausea or vomiting)., Disp: 30 tablet, Rfl: 1   traMADol (ULTRAM) 50 MG tablet, Take 1 tablet (50 mg total) by mouth every 6 (six) hours as needed. (Patient not taking: Reported on 01/28/2023), Disp: 30 tablet, Rfl: 0  Review of Symptoms: Pertinent positives as per HPI.  Physical Exam: Deferred given limitations of phone visit.  Laboratory & Radiologic Studies: Cytology FINAL MICROSCOPIC DIAGNOSIS:  - No malignant cells identified   Pathology: Cervix:           Low-grade squamous intraepithelial lesion.            Negative for high-grade dysplasia or malignancy.        Endometrium:            Weakly proliferative endometrium.            Negative for hyperplasia or malignancy.        Myometrium:            Unremarkable.             Negative for malignancy.        Serosa:            Unremarkable.            Negative for malignancy.   Left ovary:            Benign hemorrhagic follicular cyst with histiocytic reaction.            Negative for malignancy.        Left fallopian tube:            Benign fimbriated fallopian tube with paratubal cyst.            Negative for malignancy.   Assessment & Plan: Stephanie [name] is a 42 y.o. woman with history of colon cancer, most recently with recurrence in the right ovary in 2023, now s/p robotic total hysterectomy and left salpingo-oophorectomy in the setting of left adnexal cyst and abnormal uterine bleeding despite progesterone therapy.  Patient is doing well, sounds like she is recovering without difficulty.  Reviewed expectations and restrictions.  Her estrogen patch is not sticking well.  Sent a different prescription to her pharmacy.  Reviewed pathology from surgery.  She is very happy with this news.  Discussed low-grade cervical dysplasia seen within the cervix, not surprising given her last Pap.  Given her immunosuppression due to cancer diagnosis and prior chemotherapy, I would recommend that she have continued follow-up with her OB/GYN.  I discussed the assessment and treatment plan with the patient. The patient was provided with an opportunity to ask questions and all were answered. The patient agreed with the plan and demonstrated an understanding of the instructions.   The patient was advised to call back or see an in-person evaluation if the symptoms worsen or  if the condition fails to improve as anticipated.   8 minutes of total time was spent for this patient encounter, including preparation, phone counseling with the patient and coordination of care, and documentation of the encounter.   Eugene Garnet, MD  Division of Gynecologic Oncology  Department of Obstetrics and Gynecology  [facility] [place]

## 2023-02-11 DIAGNOSIS — C19 Malignant neoplasm of rectosigmoid junction: Secondary | ICD-10-CM

## 2023-02-11 DIAGNOSIS — N83201 Unspecified ovarian cyst, right side: Secondary | ICD-10-CM

## 2023-02-19 ENCOUNTER — Inpatient Hospital Stay: Payer: Medicare Other | Admitting: Gynecologic Oncology

## 2023-02-19 ENCOUNTER — Encounter: Payer: Self-pay | Admitting: Gynecologic Oncology

## 2023-02-19 VITALS — BP 106/70 | HR 70 | Temp 98.1°F | Resp 18 | Ht 68.66 in | Wt 160.6 lb

## 2023-02-19 DIAGNOSIS — Z90710 Acquired absence of both cervix and uterus: Secondary | ICD-10-CM

## 2023-02-19 DIAGNOSIS — E8940 Asymptomatic postprocedural ovarian failure: Secondary | ICD-10-CM

## 2023-02-19 DIAGNOSIS — N838 Other noninflammatory disorders of ovary, fallopian tube and broad ligament: Secondary | ICD-10-CM

## 2023-02-19 DIAGNOSIS — Z90722 Acquired absence of ovaries, bilateral: Secondary | ICD-10-CM

## 2023-02-19 DIAGNOSIS — G8918 Other acute postprocedural pain: Secondary | ICD-10-CM

## 2023-02-19 DIAGNOSIS — N870 Mild cervical dysplasia: Secondary | ICD-10-CM

## 2023-02-19 MED ORDER — IBUPROFEN 600 MG PO TABS
600.0000 mg | ORAL_TABLET | Freq: Four times a day (QID) | ORAL | 1 refills | Status: AC | PRN
Start: 2023-02-19 — End: ?

## 2023-02-19 NOTE — Progress Notes (Signed)
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 Redemonstrated hypoenhancing lesion of the posterior liver dome, hepatic segment VII, reduced in size compared to prior examination, measuring 1.3 x 1.3 cm, previously 1.8 x 1.7 cm. Findings are consistent with treatment response of a biopsy proven metastasis. No other evidence of lymphadenopathy or metastatic disease within the abdomen or pelvis. 2. Unchanged mild splenomegaly, maximum coronal span 14.0 cm. 3. 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   03/12/2020 Initial Diagnosis   Malignant neoplasm of rectosigmoid junction (HCC)   03/12/2020 Imaging   CT AP with contrast IMPRESSION: 1. Overall findings are highly concerning for colorectal carcinoma involving the sigmoid colon with an associated perforation and adjacent abscess and phlegmon formation as detailed above. Currently, no collection is amenable to percutaneous drainage given their small size and location. 2. New 2 cm mass in the right hepatic lobe concerning for metastatic disease to the liver until proven otherwise. 3. Enlarged regional lymph nodes as detailed above is concerning for nodal metastatic disease. 4. Large stool burden. 5. Prominent pelvic veins which can be seen in patients with pelvic congestion syndrome.   03/13/2020 Imaging   ABD US IMPRESSION: Approximately 2.1 x 2.4 x 2.0 cm lobular homogeneously echogenic lesion in the right hepatic dome corresponds with the abnormality seen on the prior CT scan.  Sonographically, this appearance is highly suggestive of a benign hemangioma.   Recommend MRI of the abdomen with gadolinium contrast which may provide a noninvasive diagnosis of benign hemangioma.    03/13/2020 Imaging   MR ABD W/WO CONTRAST Hepatobiliary: Diffuse low signal intensity throughout the hepatic parenchyma on T2 weighted images, presumably a consequence of recent Feraheme injection. In segment 7 of the liver (axial image 8 of series 5) there is a 2.5 x 1.9 cm well-defined lesion which is slightly T2 hyperintense. This lesion appears hyperintense on pre gadolinium T1 weighted images (likely a consequence of Feraheme). Interpretation of enhancement within the lesion is compromised by presence of Feraheme. No other hepatic lesions are confidently identified on today's examination. No intra or extrahepatic biliary ductal dilatation. Gallbladder is normal in appearance.   03/31/2020 Imaging   CT AP W contrast IMPRESSION: 1. Previously noted sigmoid colon mass appears increased in size, and again appears to be associated with a focal contained perforation which crosses the midline and has fistulized into the left adnexal region where there is now what appears to be a large left tubo-ovarian abscess, as detailed above. This is also associated with multiple enlarged lymph nodes in the pelvis measuring up to 1.2 cm in short axis and borderline enlarged retroperitoneal lymph nodes, concerning for metastatic disease. In addition, previously suspected metastatic lesion in segment 7 of the liver has enlarged. 2. Small volume of ascites. 3. Additional incidental findings, as above.   04/04/2020 Cancer Staging   Staging form: Colon and Rectum, AJCC 8th Edition - Pathologic stage from 04/04/2020: pT4a, pN1b, cM1 - Signed by Pollyann Samples, NP on 05/07/2020   04/04/2020 Procedure   Paracentesis, path showed no malignant cells (mixed acute and chronic inflammation present)  04/04/2020 Surgery    Open sigmoid colectomy and end colostomy by Dr. Almond Lint   04/04/2020 Pathology Results   FINAL MICROSCOPIC DIAGNOSIS: A. COLON, RECTOSIGMOID, RESECTION: - Invasive moderately differentiated adenocarcinoma, 6 cm, involving rectosigmoid junction - Carcinoma invades into serosal surface with perforation and associated serositis - Radial resection margin is positive for carcinoma; proximal and distal margins are not involved - Lymphovascular invasion is present - Metastatic carcinoma to one of fifteen lymph nodes (1/15); one tumor deposit - See oncology table B. LYMPH NODES, MESENTERIC, RESECTION: - Metastatic adenocarcinoma to one of six lymph nodes (1/6) - One tumor deposit  Addendum to note 2 involved lymph nodes (of 21 examined nodes) pT4a,pN1b MMR-normal, preserved expression of MLH1, MSH2, MSH6, PMS2   04/12/2020 Procedure   PAC placement    04/13/2020 Imaging   CT chest without contrast IMPRESSION: Interval development of bilateral pleural effusions, left slightly greater than right, with resultant bibasilar atelectasis including subtotal collapse of the left lower lobe. No evidence of intrathoracic metastatic disease, though evaluation of the collapsed parenchyma is limited. Hepatic metastasis again demonstrated.     04/16/2020 Pathology Results   FINAL MICROSCOPIC DIAGNOSIS:  A. LIVER, RIGHT LOBE, BIOPSY:  - Adenocarcinoma.  COMMENT:  The morphology is compatible with the provided clinical history of colorectal carcinoma.    05/23/2020 - 10/24/2020 Chemotherapy   FOLFIRINOX q2weeks for 3-6 months starting 05/23/20. Bevacizumab-bvzr Omer Jack) added with C2. Oxaliplatin held with C11-12 due to reaction. (pt developed SOB, chest palpitation and abdominal discomfort shortly after oxaliplatin started). Completed on 10/24/20.   08/05/2020 Imaging   CT AP  IMPRESSION: 1. Postsurgical changes of distal colectomy with a left lower quadrant end ostomy. No evidence of obstruction  or acute complication at this time. Excluded rectal pouch in the deep pelvis without acute complication or worrisome features. 2. Slight interval decrease in size of a hypoattenuating lesion posterior right lobe liver measuring 1.7 x 1.8 x 2 cm. This lesion has previously undergone ultrasound-guided biopsy with pathologic results demonstrating adenocarcinoma compatible with metastatic disease from patient's resected colorectal carcinoma. 3. Slight prominence of the parametrial vessels bilaterally, nonspecific though can be seen in the setting of pelvic congestion syndrome. 4. Mild splenomegaly.  No focal lesion.     11/12/2020 Imaging   CT Chest  IMPRESSION: 1. No evidence of metastatic disease in the chest. 2. 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   12/13/2020 Surgery   A. 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 The report date is 01/28/2021.   The CustomNext-Cancer+RNAinsight panel offered by [facility] included sequencing and rearrangement analysis for the following 47 genes:  APC, ATM, AXIN2, BARD1, BMPR1A, BRCA1, BRCA2, BRIP1, CDH1, CDK4, CDKN2A, CHEK2, DICER1, EPCAM, GREM1, HOXB13, MEN1, MLH1, MSH2, MSH3, MSH6, MUTYH, NBN, NF1, NF2, NTHL1, PALB2, PMS2, POLD1, POLE, PTEN, RAD51C, RAD51D, RECQL, RET, SDHA, SDHAF2, SDHB, SDHC, SDHD, SMAD4, SMARCA4, STK11, TP53, TSC1, TSC2, and VHL.  RNA data is routinely analyzed for use in variant interpretation for all genes.   02/19/2021 Imaging   CT A/P w/o contrast  IMPRESSION: Slightly limited  examination examination in absence  of contrast administration. Status post partial right hepatectomy. Interval decrease in size in perihepatic fluid collection and resolution of right subdiaphragmatic fluid and gas. No new intra-abdominal fluid collections are identified.   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 Moderate stool throughout the colon without evidence of obstruction.   Fluid distension of the proximal duodenum to the level of the SMA hiatus which appears narrow. 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   05/05/2021 Imaging   CT AP  IMPRESSION: Postsurgical changes as described stable in appearance from the prior exam.   Changes suggestive of mild pelvic varices.   08/22/2021 Imaging   EXAM: CT ABDOMEN AND PELVIS WITH CONTRAST  IMPRESSION: 1. Extensive bowel content is identified throughout the colon consistent with constipation. 2. 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 3. Stable postoperative changes in the right lobe liver.   09/12/2021 Survivorship   SCP delivered by Santiago Glad, NP   10/05/2021 Imaging   EXAM: CT ABDOMEN AND PELVIS WITH CONTRAST  IMPRESSION: 1. Right ovarian/adnexal cyst, 5.2 cm. Because this lesion is not adequately characterized, prompt [place] is recommended for further evaluation. Note: This recommendation does not apply to premenarchal patients and to those with increased risk (genetic, family history, elevated tumor markers or other high-risk factors) of ovarian cancer. Reference: JACR 2020 Feb; 17(2):248-254 2. No other evidence of an acute abnormality within the abdomen or pelvis. 3. Moderate increase in the colonic and rectal stool burden. No bowel obstruction or inflammation. 4. No evidence of locally recurrent or metastatic colon  carcinoma.   11/12/2021 Imaging   EXAM: CT CHEST WITHOUT CONTRAST  IMPRESSION: 1. No evidence of pulmonary metastatic disease. 2. Stable postoperative findings about the posterior right lobe of the liver.   03/28/2022 - 04/18/2022 Chemotherapy   Patient is on Treatment Plan : COLORECTAL Xelox (Capeox) q21d     05/23/2022 - 05/23/2022 Chemotherapy   Patient is on Treatment Plan : COLORECTAL CAPEOX (130/850) q21d x 8 cycles       Interval History: Patient reports overall doing well.  Still has some abdominal soreness, worse at the end of the day.  Is working on moving around more each day.  Has had some minimal pink spotting, denies any heavy bleeding.  Reports normal and improved bowel function.  Has some different sensation related to her bladder but denies any dysuria.  Her OB/GYN for annual exam yesterday, sounds like a urinalysis was done that was negative for signs of infection.  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 Malachy Mood, oncology @ [facility].   Colon polyps    Colostomy present (HCC)    03-2020, reversed 2022   Family history of bladder cancer    Headache    History of chemotherapy    ended 09-2020, again in 2023, see 01/19/23 OV note in Epic by Dr. Malachy Mood.  Lactose intolerance 03/12/2020   Neuromuscular disorder (HCC)    neuropathy feet hands legs   Ovarian cyst 01/16/2023   3.2 cm left ovarian cyst , see 01/16/23 Pelvic US in Epic   UTI (lower urinary tract infection)     Past Surgical  History:  Procedure Laterality Date   CESAREAN SECTION  2010   CHOLECYSTECTOMY  12/13/2020   COLECTOMY  03/2020   COLONOSCOPY     11/20/22 &03/18/21   COLOSTOMY TAKEDOWN N/A 05/30/2021   Procedure: LAPAROSCOPIC ASSISTED COLOSTOMY TAKEDOWN;  Surgeon: Almond Lint, MD;  Location: [facility] OR;  Service: General;  Laterality: N/A;   CYSTOSCOPY WITH STENT PLACEMENT  04/04/2020   Procedure: CYSTOSCOPY WITH STENT PLACEMENT;  Surgeon: Almond Lint, MD;  Location: [facility];  Service: General;;   LAPAROSCOPIC LIVER ULTRASOUND N/A 12/13/2020   Procedure: INTRAOPERATIVE LIVER ULTRASOUND;  Surgeon: Almond Lint, MD;  Location: [facility] OR;  Service: General;  Laterality: N/A;   LAPAROSCOPY N/A 12/13/2020   Procedure: LAPAROSCOPY DIAGNOSTIC;  Surgeon: Almond Lint, MD;  Location: [facility] OR;  Service: General;  Laterality: N/A;   LAPAROTOMY N/A 04/04/2020   Procedure: EXPLORATORY LAPAROTOMY;  Surgeon: Almond Lint, MD;  Location: [facility];  Service: General;  Laterality: N/A;   OPEN PARTIAL HEPATECTOMY  N/A 12/13/2020   Procedure: OPEN PARTIAL HEPATECTOMY;  Surgeon: Almond Lint, MD;  Location: [facility] OR;  Service: General;  Laterality: N/A;  ROOM 2 STARTING AT 09:30AM FOR 300 MIN   PORTACATH PLACEMENT Right 04/12/2020   Procedure: INSERTION PORT-A-CATH WITH ULTRASOUND;  Surgeon: Kinsinger, De Blanch, MD;  Location: [facility];  Service: General;  Laterality: Right;   ROBOTIC ASSISTED TOTAL HYSTERECTOMY WITH BILATERAL SALPINGO OOPHERECTOMY Left 01/29/2023   Procedure: XI ROBOTIC ASSISTED TOTAL HYSTERECTOMY WITH LEFT SALPINGO OOPHORECTOMY; LYSIS OF ADHESIONS;  Surgeon: Carver Fila, MD;  Location: [facility];  Service: Gynecology;  Laterality: Left;    Family History  Problem Relation Age of Onset   Irritable bowel syndrome Mother    Heart disease Father    Stroke Father    Aneurysm Father    Irritable bowel syndrome Brother    Other Brother        gluten intolerance   Bladder Cancer Maternal Grandmother        dx 90s    Colon cancer Neg Hx    Esophageal cancer Neg Hx    Colon polyps Neg Hx    Stomach cancer Neg Hx    Rectal cancer Neg Hx    Breast cancer Neg Hx    Ovarian cancer Neg Hx    Endometrial cancer Neg Hx    Pancreatic cancer Neg Hx    Prostate cancer Neg Hx     Social History   Socioeconomic History   Marital status: Married    Spouse name: Not on file   Number of children: 1   Years of education: Not on file   Highest education level: Not on file  Occupational History   Occupation: hair stylist  Tobacco Use   Smoking status: Former    Packs/day: 0.50    Years: 10.00    Additional pack years: 0.00    Total pack years: 5.00    Types: Cigarettes    Quit date: 2018    Years since quitting: 6.3   Smokeless tobacco: Never  Vaping Use   Vaping Use: Never used  Substance and Sexual Activity   Alcohol use: Not Currently   Drug use: No   Sexual activity: Yes  Partners: Male    Birth control/protection: None  Other Topics Concern   Not on file  Social History Narrative   Not on file   Social Determinants of Health   Financial Resource Strain: High Risk (05/09/2022)   Overall Financial Resource Strain (CARDIA)    Difficulty of Paying Living Expenses: Very hard  Food Insecurity: Food Insecurity Present (05/09/2022)   Hunger Vital Sign    Worried About Running Out of Food in the Last Year: Sometimes true    Ran Out of Food in the Last Year: Sometimes true  Transportation Needs: Unmet Transportation Needs (05/09/2022)   PRAPARE - Transportation    Lack of Transportation (Medical): No    Lack of Transportation (Non-Medical): Yes  Physical Activity: Not on file  Stress: Stress Concern Present (05/09/2022)   [employer] of Occupational Health - Occupational Stress Questionnaire    Feeling of Stress : Very much  Social Connections: Moderately Isolated (05/09/2022)   Social Connection and Isolation Panel [NHANES]    Frequency of Communication with Friends and Family: More  than three times a week    Frequency of Social Gatherings with Friends and Family: More than three times a week    Attends Religious Services: 1 to 4 times per year    Active Member of [employer] or Organizations: No    Attends Banker Meetings: Never    Marital Status: Never married    Current Medications:  Current Outpatient Medications:    acetaminophen (TYLENOL) 325 MG tablet, Take 2 tablets (650 mg total) by mouth every 6 (six) hours as needed for mild pain, moderate pain, fever or headache (fever > 101). (Patient taking differently: Take 650 mg by mouth every 6 (six) hours as needed for mild pain, moderate pain, fever or headache.), Disp: , Rfl:    buPROPion ER (WELLBUTRIN SR) 100 MG 12 hr tablet, Take 100 mg by mouth daily., Disp: , Rfl:    clonazePAM (KLONOPIN) 1 MG tablet, Take 1 mg by mouth 2 (two) times daily as needed for anxiety., Disp: , Rfl:    estradiol (VIVELLE-DOT) 0.1 MG/24HR patch, Place 1 patch (0.1 mg total) onto the skin 2 (two) times a week., Disp: 8 patch, Rfl: 12   HYDROcodone-acetaminophen (NORCO/VICODIN) 5-325 MG tablet, Take 1 tablet by mouth every 6 (six) hours as needed for severe pain., Disp: 30 tablet, Rfl: 0   ibuprofen (ADVIL) 600 MG tablet, Take 1 tablet (600 mg total) by mouth every 6 (six) hours as needed. Please make sure to drink plenty of water, Disp: 20 tablet, Rfl: 1   Multiple Vitamin (MULTIVITAMIN WITH MINERALS) TABS tablet, Take 1 tablet by mouth daily., Disp: , Rfl:    nitrofurantoin, macrocrystal-monohydrate, (MACROBID) 100 MG capsule, Take 100 mg by mouth daily as needed (sexual activity)., Disp: , Rfl:    ondansetron (ZOFRAN) 8 MG tablet, TAKE 1 TABLET(8 MG) BY MOUTH EVERY 8 HOURS AS NEEDED FOR NAUSEA OR VOMITING (Patient taking differently: Take 8 mg by mouth as needed for nausea or vomiting.), Disp: 30 tablet, Rfl: 2   Probiotic Product (PROBIOTIC PO), Take 2 capsules by mouth daily., Disp: , Rfl:    prochlorperazine (COMPAZINE) 10 MG  tablet, Take 1 tablet (10 mg total) by mouth every 6 (six) hours as needed (Nausea or vomiting)., Disp: 30 tablet, Rfl: 1  Review of Systems: Denies appetite changes, fevers, chills, fatigue, unexplained weight changes. Denies hearing loss, neck lumps or masses, mouth sores, ringing in ears or voice changes. Denies cough  or wheezing.  Denies shortness of breath. Denies chest pain or palpitations. Denies leg swelling. Denies abdominal distention, pain, blood in stools, constipation, diarrhea, nausea, vomiting, or early satiety. Denies pain with intercourse, dysuria, frequency, hematuria or incontinence. Denies hot flashes, pelvic pain, vaginal bleeding or vaginal discharge.   Denies joint pain, back pain or muscle pain/cramps. Denies itching, rash, or wounds. Denies dizziness, headaches, numbness or seizures. Denies swollen lymph nodes or glands, denies easy bruising or bleeding. Denies anxiety, depression, confusion, or decreased concentration.  Physical Exam: BP 106/70 (BP Location: Right Arm, Patient Position: Sitting)   Pulse 70   Temp 98.1 F (36.7 C) (Oral)   Resp 18   Ht 5' 8.66" (1.744 m)   Wt 160 lb 9.6 oz (72.8 kg)   SpO2 99%   BMI 23.95 kg/m  General: Alert, oriented, no acute distress. HEENT: Normocephalic, atraumatic, sclera anicteric. Chest: Unlabored breathing on room air. Abdomen: soft, nontender.  Normoactive bowel sounds.  No masses or hepatosplenomegaly appreciated.  Well-healed incisions. Extremities: Grossly normal range of motion.  Warm, well perfused.  No edema bilaterally. GU: Normal appearing external genitalia without erythema, excoriation, or lesions.  Speculum exam reveals cuff intact with suture visible, minimal blood-tinged discharge at the apex of the vagina.  Small area of the cuff that looks minimally friable, treated with silver nitrate.  Bimanual exam reveals cuff intact, no fluctuance or tenderness to palpation.    Laboratory & Radiologic  Studies: Cytology FINAL MICROSCOPIC DIAGNOSIS:  - No malignant cells identified    Pathology: Cervix:           Low-grade squamous intraepithelial lesion.            Negative for high-grade dysplasia or malignancy.        Endometrium:            Weakly proliferative endometrium.            Negative for hyperplasia or malignancy.        Myometrium:            Unremarkable.            Negative for malignancy.        Serosa:            Unremarkable.            Negative for malignancy.   Left ovary:            Benign hemorrhagic follicular cyst with histiocytic reaction.            Negative for malignancy.        Left fallopian tube:            Benign fimbriated fallopian tube with paratubal cyst.            Negative for malignancy.   Assessment & Plan: Stephanie [name] is a 42 y.o. woman with a history of colon cancer, most recently with recurrence in the right ovary in 2023, now s/p robotic total hysterectomy and left salpingo-oophorectomy in the setting of left adnexal cyst and abnormal uterine bleeding despite progesterone therapy.  The patient is doing well.  Has healed nicely from surgery.  Reviewed continued postoperative restrictions and expectations.  Discussed some increased soreness at the end of the day.  Recommended that she try higher dose Motrin or ibuprofen.  Prescription was sent for 600 mg that can be taken up to every 6 hours.  Encourage patient to drink lots of water if she is taking this  more regularly.  Reviewed final pathology from surgery.  The patient was given a copy of her pathology report.  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  In the setting of prior Pap showing low-grade dysplasia, I think she would benefit from some continued surveillance.  I would recommend a vaginal Pap with HPV testing a year after surgery.  Results of this would determine whether she has continued close or Pap surveillance or routine surveillance.   Menopausal  symptoms well-controlled on estrogen patch.  New patch is sticking much better to her skin.  The patient will follow-up with her OB/GYN.  20 minutes of total time was spent for this patient encounter, including preparation, face-to-face counseling with the patient and coordination of care, and documentation of the encounter.  Eugene Garnet, MD  Division of Gynecologic Oncology  Department of Obstetrics and Gynecology  [facility]

## 2023-02-19 NOTE — Patient Instructions (Signed)
It was good to see you today.  You are healing very well from surgery.  Please remember, no heavy lifting for at least 6 weeks and nothing in the vagina for at least 10 weeks.

## 2023-02-25 ENCOUNTER — Inpatient Hospital Stay (HOSPITAL_BASED_OUTPATIENT_CLINIC_OR_DEPARTMENT_OTHER): Payer: Medicare Other | Admitting: Hematology

## 2023-02-25 DIAGNOSIS — C19 Malignant neoplasm of rectosigmoid junction: Secondary | ICD-10-CM

## 2023-02-25 MED ORDER — TRAMADOL HCL 50 MG PO TABS: 50.0000 mg | ORAL_TABLET | Freq: Two times a day (BID) | ORAL | 0 refills | Status: DC | PRN

## 2023-02-25 NOTE — Progress Notes (Signed)
[facility]   [phone] Fax:([phone]   Clinic Follow up Note   Patient Care Team: [facility] as PCP - General Malachy Mood, MD as Consulting Physician (Oncology) Almond Lint, MD as Consulting Physician (General Surgery) Pollyann Samples, NP as Nurse Practitioner (Nurse Practitioner) Warden Fillers, MD as Consulting Physician (Obstetrics and Gynecology) Lyn Henri, MD as Consulting Physician (Obstetrics and Gynecology)  Date of Service:  02/25/2023  I connected with Stephanie [name] on 02/25/2023 at  1:20 PM EDT by  and verified that I am speaking with the correct person using two identifiers.  I discussed the limitations, risks, security and privacy concerns of performing an evaluation and management service by telephone and the availability of in person appointments. I also discussed with the patient that there may be a patient responsible charge related to this service. The patient expressed understanding and agreed to proceed.   Other persons participating in the visit and their role in the encounter:  no  Patient's location:   Provider's location:  [facility] office CHIEF COMPLAINT: f/u of metastatic colon cancer   CURRENT THERAPY:   -Xeloda, q21d, starting 03/28/22, currently 1500mg  am and 1000mg  pm day 1-14 from 05/19/22     ASSESSMENT & PLAN:  Stephanie [name] is a 42 y.o. 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 Liver metastasis confirmed by biopsy 04/16/20. -Foundation One showed K-ras mutation, she is not a candidate for EGFR inhibitor -s/p FOLFOXIRI/bevacizumab  05/23/20 - 10/24/20. 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 -s/p liver resection on 12/13/20 under Dr. Donell Beers.  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 -CAPEOX started 03/28/22, oxali discontinued after 05/23/22 due to recurrent infusion reaction. -she completed Xeloda in 08/2022. -restaging CT CAP 07/30/22 showed: 3.4 cm lesion in left ovary, likely a cyst; otherwise, no evidence of new or progressive disease. -CEA has been WNL/stable since initially taken after colectomy. -further evaluation with pelvic US on 08/04/22 showed a 3 cm mass in left ovary, favored benign and likely a hemorrhagic cyst.  -PET scan on 08/13/22 showed a hypermetabolic focus in pelvis associated with right fundal region of uterus, without obvious CT abnormality, likely representing a benign fibroid. No findings for local recurrence or metastatic disease. -she was referred to Dr. Pricilla Holm and is scheduled to hold oophorectomy on Feb 11, 2023 -She has developed continuous vaginal bleeding and left-sided upper buttock pain, which she feels is related to the left hemorrhagic ovarian cyst. -I reviewed her CT scan from January 02, 2014 2024, and pelvic ultrasound from January 16, 2023, which showed decreased size of the left ovarian cyst with solid component, no other evidence of cancer recurrence.   -she underwent hysterectomy and left oophorectomy sooner than Jan 29, 2023.  I reviewed her surgical pathology findings, which were all benign. -She is clinically doing well, her abdominal pain is much improved, she is off hydrocodone, but wants to refill tramadol in case she has significant pain sometime. -We again discussed the risk of recurrence, and will continue surveillance.    PLAN: -I  order CT CAP in 3 months - I refill Tramadol -lab/flush and f/u in 3 months    SUMMARY OF ONCOLOGIC HISTORY: Oncology History Overview Note  Cancer Staging Malignant neoplasm of rectosigmoid junction [facility]) Staging form: Colon  and Rectum, AJCC 8th Edition - Pathologic stage from 04/04/2020: pT4a, pN1b, cM1 - Signed by Pollyann Samples, NP on 05/07/2020     Malignant neoplasm of rectosigmoid junction (HCC)  11/10/2019 Imaging   MRI Abdomen  IMPRESSION: 1. Redemonstrated hypoenhancing lesion of the posterior liver dome, hepatic segment VII, reduced in size compared to prior examination, measuring 1.3 x 1.3 cm, previously 1.8 x 1.7 cm. Findings are consistent with treatment response of a biopsy proven metastasis. No other evidence of lymphadenopathy or metastatic disease within the abdomen or pelvis. 2. Unchanged mild splenomegaly, maximum coronal span 14.0 cm. 3. 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   03/12/2020 Initial Diagnosis   Malignant neoplasm of rectosigmoid junction (HCC)   03/12/2020 Imaging   CT AP with contrast IMPRESSION: 1. Overall findings are highly concerning for colorectal carcinoma involving the sigmoid colon with an associated perforation and adjacent abscess and phlegmon formation as detailed above. Currently, no collection is amenable to percutaneous drainage given their small size and location. 2. New 2 cm mass in the right hepatic lobe concerning for metastatic disease to the liver until proven otherwise. 3. Enlarged regional lymph nodes as detailed above is concerning for nodal metastatic disease. 4. Large stool burden. 5. Prominent pelvic veins which can be seen in patients with pelvic congestion syndrome.   03/13/2020 Imaging   ABD US IMPRESSION: Approximately 2.1 x 2.4 x 2.0 cm lobular homogeneously echogenic lesion in the right hepatic dome corresponds with the abnormality seen on the prior CT scan. Sonographically, this appearance is highly suggestive of a benign hemangioma.   Recommend MRI of the abdomen with gadolinium contrast which may provide a noninvasive diagnosis of benign hemangioma.    03/13/2020 Imaging   MR ABD W/WO CONTRAST Hepatobiliary: Diffuse low signal intensity throughout the hepatic parenchyma on T2 weighted images, presumably a consequence of  recent Feraheme injection. In segment 7 of the liver (axial image 8 of series 5) there is a 2.5 x 1.9 cm well-defined lesion which is slightly T2 hyperintense. This lesion appears hyperintense on pre gadolinium T1 weighted images (likely a consequence of Feraheme). Interpretation of enhancement within the lesion is compromised by presence of Feraheme. No other hepatic lesions are confidently identified on today's examination. No intra or extrahepatic biliary ductal dilatation. Gallbladder is normal in appearance.   03/31/2020 Imaging   CT AP W contrast IMPRESSION: 1. Previously noted sigmoid colon mass appears increased in size, and again appears to be associated with a focal contained perforation which crosses the midline and has fistulized into the left adnexal region where there is now what appears to be a large left tubo-ovarian abscess, as detailed above. This is also associated with multiple enlarged lymph nodes in the pelvis measuring up to 1.2 cm in short axis and borderline enlarged retroperitoneal lymph nodes, concerning for metastatic disease. In addition, previously suspected metastatic lesion in segment 7 of the liver has enlarged. 2. Small volume of ascites. 3. Additional incidental findings, as above.   04/04/2020 Cancer Staging   Staging form: Colon and Rectum, AJCC 8th Edition - Pathologic stage from 04/04/2020: pT4a, pN1b, cM1 - Signed by Pollyann Samples, NP on 05/07/2020   04/04/2020 Procedure   Paracentesis, path showed no malignant cells (mixed acute and chronic inflammation present)   04/04/2020 Surgery   Open sigmoid colectomy and end colostomy by Dr. Almond Lint   04/04/2020 Pathology Results   FINAL MICROSCOPIC DIAGNOSIS: A. COLON, RECTOSIGMOID, RESECTION: - Invasive moderately differentiated  adenocarcinoma, 6 cm, involving rectosigmoid junction - Carcinoma invades into serosal surface with perforation and associated serositis - Radial resection margin is positive  for carcinoma; proximal and distal margins are not involved - Lymphovascular invasion is present - Metastatic carcinoma to one of fifteen lymph nodes (1/15); one tumor deposit - See oncology table B. LYMPH NODES, MESENTERIC, RESECTION: - Metastatic adenocarcinoma to one of six lymph nodes (1/6) - One tumor deposit  Addendum to note 2 involved lymph nodes (of 21 examined nodes) pT4a,pN1b MMR-normal, preserved expression of MLH1, MSH2, MSH6, PMS2   04/12/2020 Procedure   PAC placement    04/13/2020 Imaging   CT chest without contrast IMPRESSION: Interval development of bilateral pleural effusions, left slightly greater than right, with resultant bibasilar atelectasis including subtotal collapse of the left lower lobe. No evidence of intrathoracic metastatic disease, though evaluation of the collapsed parenchyma is limited. Hepatic metastasis again demonstrated.     04/16/2020 Pathology Results   FINAL MICROSCOPIC DIAGNOSIS:  A. LIVER, RIGHT LOBE, BIOPSY:  - Adenocarcinoma.  COMMENT:  The morphology is compatible with the provided clinical history of colorectal carcinoma.    05/23/2020 - 10/24/2020 Chemotherapy   FOLFIRINOX q2weeks for 3-6 months starting 05/23/20. Bevacizumab-bvzr Omer Jack) added with C2. Oxaliplatin held with C11-12 due to reaction. (pt developed SOB, chest palpitation and abdominal discomfort shortly after oxaliplatin started). Completed on 10/24/20.   08/05/2020 Imaging   CT AP  IMPRESSION: 1. Postsurgical changes of distal colectomy with a left lower quadrant end ostomy. No evidence of obstruction or acute complication at this time. Excluded rectal pouch in the deep pelvis without acute complication or worrisome features. 2. Slight interval decrease in size of a hypoattenuating lesion posterior right lobe liver measuring 1.7 x 1.8 x 2 cm. This lesion has previously undergone ultrasound-guided biopsy with pathologic results demonstrating adenocarcinoma compatible  with metastatic disease from patient's resected colorectal carcinoma. 3. Slight prominence of the parametrial vessels bilaterally, nonspecific though can be seen in the setting of pelvic congestion syndrome. 4. Mild splenomegaly.  No focal lesion.     11/12/2020 Imaging   CT Chest  IMPRESSION: 1. No evidence of metastatic disease in the chest. 2. 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   12/13/2020 Surgery   A. 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 The report date is 01/28/2021.   The CustomNext-Cancer+RNAinsight panel offered by [facility] included sequencing and rearrangement analysis for the following 47 genes:  APC, ATM, AXIN2, BARD1, BMPR1A, BRCA1, BRCA2, BRIP1, CDH1, CDK4, CDKN2A, CHEK2, DICER1, EPCAM, GREM1, HOXB13, MEN1, MLH1, MSH2, MSH3, MSH6, MUTYH, NBN, NF1, NF2, NTHL1, PALB2, PMS2, POLD1, POLE, PTEN, RAD51C, RAD51D, RECQL, RET, SDHA, SDHAF2, SDHB, SDHC, SDHD, SMAD4, SMARCA4, STK11, TP53, TSC1, TSC2, and VHL.  RNA data is routinely analyzed for use in variant interpretation for all genes.   02/19/2021 Imaging   CT A/P w/o contrast  IMPRESSION: Slightly limited examination examination in absence of contrast administration. Status post partial right hepatectomy. Interval decrease in size in perihepatic fluid collection and resolution of right subdiaphragmatic fluid and gas. No new intra-abdominal fluid collections are identified.  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 Moderate stool throughout  the colon without evidence of obstruction.   Fluid distension of the proximal duodenum to the level of the SMA hiatus which appears narrow. 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   05/05/2021 Imaging   CT AP  IMPRESSION: Postsurgical changes as described stable in appearance from the prior exam.   Changes suggestive of mild pelvic varices.   08/22/2021 Imaging   EXAM: CT ABDOMEN AND PELVIS WITH CONTRAST  IMPRESSION: 1. Extensive bowel content is identified throughout the colon consistent with constipation. 2. 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 3. Stable postoperative changes in the right lobe liver.   09/12/2021 Survivorship   SCP delivered by Santiago Glad, NP   10/05/2021 Imaging   EXAM: CT ABDOMEN AND PELVIS WITH CONTRAST  IMPRESSION: 1. Right ovarian/adnexal cyst, 5.2 cm. Because this lesion is not adequately characterized, prompt [place] is recommended for further evaluation. Note: This recommendation does not apply to premenarchal patients and to those with increased risk (genetic, family history, elevated tumor markers or other high-risk factors) of ovarian cancer. Reference: JACR 2020 Feb; 17(2):248-254 2. No other evidence of an acute abnormality within the abdomen or pelvis. 3. Moderate increase in the colonic and rectal stool burden. No bowel obstruction or inflammation. 4. No evidence of locally recurrent or metastatic colon carcinoma.   11/12/2021 Imaging   EXAM: CT CHEST WITHOUT CONTRAST  IMPRESSION: 1. No evidence of pulmonary metastatic disease. 2. Stable postoperative findings about the posterior right lobe of the liver.   03/28/2022 - 04/18/2022 Chemotherapy   Patient is on Treatment Plan : COLORECTAL Xelox (Capeox) q21d     05/23/2022 - 05/23/2022 Chemotherapy   Patient is on  Treatment Plan : COLORECTAL CAPEOX (130/850) q21d x 8 cycles        INTERVAL HISTORY:  Stephanie [name] was contacted for a follow up of  metastatic colon cancer   . She was last seen by me on 01/19/2023. Pt state that she is still having some soreness, but can't tell if its from surgery or the pain she had previous to surgery. Overall she is doing well.   All other systems were reviewed with the patient and are negative.  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 Malachy Mood, oncology @ [facility].   Colon polyps    Colostomy present (HCC)    03-2020, reversed 2022   Family history of bladder cancer    Headache    History of chemotherapy    ended 09-2020, again in 2023, see 01/19/23 OV note in Epic by Dr. Malachy Mood.   Lactose intolerance 03/12/2020   Neuromuscular disorder (HCC)    neuropathy feet hands legs   Ovarian cyst 01/16/2023   3.2 cm left ovarian cyst , see 01/16/23 Pelvic US in Epic   UTI (lower urinary  tract infection)     SURGICAL HISTORY: Past Surgical History:  Procedure Laterality Date   CESAREAN SECTION  2010   CHOLECYSTECTOMY  12/13/2020   COLECTOMY  03/2020   COLONOSCOPY     11/20/22 &03/18/21   COLOSTOMY TAKEDOWN N/A 05/30/2021   Procedure: LAPAROSCOPIC ASSISTED COLOSTOMY TAKEDOWN;  Surgeon: Almond Lint, MD;  Location: [facility] OR;  Service: General;  Laterality: N/A;   CYSTOSCOPY WITH STENT PLACEMENT  04/04/2020   Procedure: CYSTOSCOPY WITH STENT PLACEMENT;  Surgeon: Almond Lint, MD;  Location: [facility];   Service: General;;   LAPAROSCOPIC LIVER ULTRASOUND N/A 12/13/2020   Procedure: INTRAOPERATIVE LIVER ULTRASOUND;  Surgeon: Almond Lint, MD;  Location: [facility] OR;  Service: General;  Laterality: N/A;   LAPAROSCOPY N/A 12/13/2020   Procedure: LAPAROSCOPY DIAGNOSTIC;  Surgeon: Almond Lint, MD;  Location: [facility] OR;  Service: General;  Laterality: N/A;   LAPAROTOMY N/A 04/04/2020   Procedure: EXPLORATORY LAPAROTOMY;  Surgeon: Almond Lint, MD;  Location: [facility];  Service: General;  Laterality: N/A;   OPEN PARTIAL HEPATECTOMY  N/A 12/13/2020   Procedure: OPEN PARTIAL HEPATECTOMY;  Surgeon: Almond Lint, MD;  Location: [facility] OR;  Service: General;  Laterality: N/A;  ROOM 2 STARTING AT 09:30AM FOR 300 MIN   PORTACATH PLACEMENT Right 04/12/2020   Procedure: INSERTION PORT-A-CATH WITH ULTRASOUND;  Surgeon: Kinsinger, De Blanch, MD;  Location: [facility];  Service: General;  Laterality: Right;   ROBOTIC ASSISTED TOTAL HYSTERECTOMY WITH BILATERAL SALPINGO OOPHERECTOMY Left 01/29/2023   Procedure: XI ROBOTIC ASSISTED TOTAL HYSTERECTOMY WITH LEFT SALPINGO OOPHORECTOMY; LYSIS OF ADHESIONS;  Surgeon: Carver Fila, MD;  Location: [facility];  Service: Gynecology;  Laterality: Left;    I have reviewed the social history and family history with the patient and they are unchanged from previous note.  ALLERGIES:  is allergic to oxaliplatin.  MEDICATIONS:  Current Outpatient Medications  Medication Sig Dispense Refill   traMADol (ULTRAM) 50 MG tablet Take 1 tablet (50 mg total) by mouth every 12 (twelve) hours as needed. 20 tablet 0   acetaminophen (TYLENOL) 325 MG tablet Take 2 tablets (650 mg total) by mouth every 6 (six) hours as needed for mild pain, moderate pain, fever or headache (fever > 101). (Patient taking differently: Take 650 mg by mouth every 6 (six) hours as needed for mild pain, moderate pain, fever or headache.)     buPROPion ER (WELLBUTRIN SR) 100 MG 12 hr tablet Take 100 mg by mouth daily.      clonazePAM (KLONOPIN) 1 MG tablet Take 1 mg by mouth 2 (two) times daily as needed for anxiety.     estradiol (VIVELLE-DOT) 0.1 MG/24HR patch Place 1 patch (0.1 mg total) onto the skin 2 (two) times a week. 8 patch 12   ibuprofen (ADVIL) 600 MG tablet Take 1 tablet (600 mg total) by mouth every 6 (six) hours as needed. Please make sure to drink plenty of water 20 tablet 1   Multiple Vitamin (MULTIVITAMIN WITH MINERALS) TABS tablet Take 1 tablet by mouth daily.     nitrofurantoin, macrocrystal-monohydrate, (MACROBID) 100 MG capsule Take 100 mg by mouth daily as needed (sexual activity).     ondansetron (ZOFRAN) 8 MG tablet TAKE 1 TABLET(8 MG) BY MOUTH EVERY 8 HOURS AS NEEDED FOR NAUSEA OR VOMITING (Patient taking differently: Take 8 mg by mouth as needed for nausea or vomiting.) 30 tablet 2   Probiotic Product (PROBIOTIC PO) Take 2 capsules by mouth daily.     prochlorperazine (COMPAZINE) 10 MG tablet Take  1 tablet (10 mg total) by mouth every 6 (six) hours as needed (Nausea or vomiting). 30 tablet 1   No current facility-administered medications for this visit.    PHYSICAL EXAMINATION: ECOG PERFORMANCE STATUS: 0 - Asymptomatic  There were no vitals filed for this visit. Wt Readings from Last 3 Encounters:  02/19/23 160 lb 9.6 oz (72.8 kg)  01/29/23 158 lb 0.7 oz (71.7 kg)  01/28/23 158 lb (71.7 kg)     No vitals taken today, Exam not performed today  LABORATORY DATA:  I have reviewed the data as listed    Latest Ref Rng & Units 01/28/2023    9:18 AM 01/19/2023   11:01 AM 12/19/2022   12:41 PM  CBC  WBC 4.0 - 10.5 K/uL 5.0  3.7  5.1   Hemoglobin 12.0 - 15.0 g/dL 16.1  09.6  04.5   Hematocrit 36.0 - 46.0 % 43.5  42.8  44.0   Platelets 150 - 400 K/uL 120  121  122         Latest Ref Rng & Units 01/28/2023    9:18 AM 01/19/2023   11:01 AM 12/19/2022   12:41 PM  CMP  Glucose 70 - 99 mg/dL 80  409  90   BUN 6 - 20 mg/dL 13  10  15    Creatinine 0.44 - 1.00 mg/dL 8.11  9.14  7.82    Sodium 135 - 145 mmol/L 137  139  138   Potassium 3.5 - 5.1 mmol/L 3.6  4.0  4.4   Chloride 98 - 111 mmol/L 103  107  105   CO2 22 - 32 mmol/L 26  28  28    Calcium 8.9 - 10.3 mg/dL 8.7  9.5  9.4   Total Protein 6.5 - 8.1 g/dL 6.5  7.1  7.1   Total Bilirubin 0.3 - 1.2 mg/dL 0.3  0.6  0.6   Alkaline Phos 38 - 126 U/L 46  43  49   AST 15 - 41 U/L 21  16  18    ALT 0 - 44 U/L 22  16  18        RADIOGRAPHIC STUDIES: I have personally reviewed the radiological images as listed and agreed with the findings in the report. No results found.    Orders Placed This Encounter  Procedures   CT CHEST ABDOMEN PELVIS W CONTRAST    Standing Status:   Future    Standing Expiration Date:   02/25/2024    Order Specific Question:   If indicated for the ordered procedure, I authorize the administration of contrast media per Radiology protocol    Answer:   Yes    Order Specific Question:   Does the patient have a contrast media/X-ray dye allergy?    Answer:   No    Order Specific Question:   Preferred imaging location?    Answer:   [facility]    Order Specific Question:   Release to patient    Answer:   Immediate    Order Specific Question:   If indicated for the ordered procedure, I authorize the administration of oral contrast media per Radiology protocol    Answer:   Yes    Order Specific Question:   Is patient pregnant?    Answer:   Yes   All questions were answered. The patient knows to call the clinic with any problems, questions or concerns. No barriers to learning was detected. The total time spent in the appointment was  21 minutes.     Malachy Mood, MD 02/25/2023   Carolin Coy am acting as scribe for Malachy Mood, MD.   I have reviewed the above documentation for accuracy and completeness, and I agree with the above.    -

## 2023-02-26 ENCOUNTER — Other Ambulatory Visit: Payer: Self-pay

## 2023-03-11 ENCOUNTER — Telehealth: Payer: BC Managed Care – PPO | Admitting: Gynecologic Oncology

## 2023-03-25 ENCOUNTER — Telehealth: Payer: Self-pay | Admitting: *Deleted

## 2023-03-25 NOTE — Telephone Encounter (Signed)
[name] called the office with a question in regards to when she maybe able to go swimming with her daughter. Patient had her surgery with Dr. Pricilla Holm on May 2nd. And is aware of following her restrictions with nothing per vagina for 10-12 weeks. As far as swimming, Pt couldn't remember at her follow up appt. With Dr. Pricilla Holm on May 23, what was mentioned about water restrictions.   Advised patient that she should wait until at least 8 weeks and her question would be relayed to Dr. Pricilla Holm. Patient also states she had some very light spotting that started yesterday and she also has gone back to work and is standing longer periods of time, but is not doing any lifting, pushing or pulling. Pt reassured that the spotting is more than likely part of the healing process but advised her to call the office with any increased bleeding, discharge, pain and fever or chills. Pt verbalized understanding. No further questions or concerns at this time.

## 2023-03-25 NOTE — Telephone Encounter (Signed)
8-10 weeks for swimming. If spotting is limited, likely related to dissolving stitch. If doesn't improve, please ask her to call back.

## 2023-03-25 NOTE — Telephone Encounter (Signed)
Attempted to reach Stephanie [name] to relay answer from Dr. Pricilla Holm in regards to patients question about when can she go swimming with her daughter and her vaginal spotting. Left a voicemail for patient to return call if spotting doesn't improve.

## 2023-04-01 ENCOUNTER — Inpatient Hospital Stay: Payer: Medicare Other | Attending: Nurse Practitioner

## 2023-04-01 ENCOUNTER — Other Ambulatory Visit: Payer: Self-pay

## 2023-04-01 VITALS — BP 119/81 | HR 69 | Temp 98.4°F | Resp 17

## 2023-04-01 DIAGNOSIS — Z85038 Personal history of other malignant neoplasm of large intestine: Secondary | ICD-10-CM | POA: Insufficient documentation

## 2023-04-01 DIAGNOSIS — Z452 Encounter for adjustment and management of vascular access device: Secondary | ICD-10-CM | POA: Insufficient documentation

## 2023-04-01 DIAGNOSIS — Z95828 Presence of other vascular implants and grafts: Secondary | ICD-10-CM

## 2023-04-01 MED ORDER — SODIUM CHLORIDE 0.9% FLUSH
10.0000 mL | Freq: Once | INTRAVENOUS | Status: AC
  Administered 2023-04-01: 10 mL

## 2023-04-01 MED ORDER — HEPARIN SOD (PORK) LOCK FLUSH 100 UNIT/ML IV SOLN
500.0000 [IU] | Freq: Once | INTRAVENOUS | Status: AC
  Administered 2023-04-01: 500 [IU]

## 2023-04-14 ENCOUNTER — Telehealth: Payer: Self-pay | Admitting: Hematology

## 2023-04-14 NOTE — Telephone Encounter (Signed)
Patient requested a different date for follow up due to work schedule conflicts, patient is aware of upcoming appointments times/dates, patient has also stated they will contact radiology to reschedule appointment

## 2023-05-25 ENCOUNTER — Ambulatory Visit (HOSPITAL_COMMUNITY)
Admission: RE | Admit: 2023-05-25 | Discharge: 2023-05-25 | Disposition: A | Discharge status: Home / Self Care | Payer: Medicare Other | Source: Ambulatory Visit | Attending: Hematology

## 2023-05-25 ENCOUNTER — Inpatient Hospital Stay: Payer: Medicare Other | Attending: Nurse Practitioner

## 2023-05-25 DIAGNOSIS — C19 Malignant neoplasm of rectosigmoid junction: Secondary | ICD-10-CM | POA: Insufficient documentation

## 2023-05-25 DIAGNOSIS — Z452 Encounter for adjustment and management of vascular access device: Secondary | ICD-10-CM | POA: Insufficient documentation

## 2023-05-25 DIAGNOSIS — C787 Secondary malignant neoplasm of liver and intrahepatic bile duct: Secondary | ICD-10-CM | POA: Diagnosis not present

## 2023-05-25 DIAGNOSIS — Z95828 Presence of other vascular implants and grafts: Secondary | ICD-10-CM

## 2023-05-25 DIAGNOSIS — Z90710 Acquired absence of both cervix and uterus: Secondary | ICD-10-CM | POA: Insufficient documentation

## 2023-05-25 LAB — CBC WITH DIFFERENTIAL (CANCER CENTER ONLY)
Abs Immature Granulocytes: 0.01 10*3/uL (ref 0.00–0.07)
Basophils Absolute: 0 10*3/uL (ref 0.0–0.1)
Basophils Relative: 1 %
Eosinophils Absolute: 0.1 10*3/uL (ref 0.0–0.5)
Eosinophils Relative: 1 %
HCT: 42.6 % (ref 36.0–46.0)
Hemoglobin: 14.6 g/dL (ref 12.0–15.0)
Immature Granulocytes: 0 %
Lymphocytes Relative: 30 %
Lymphs Abs: 1.5 10*3/uL (ref 0.7–4.0)
MCH: 31.1 pg (ref 26.0–34.0)
MCHC: 34.3 g/dL (ref 30.0–36.0)
MCV: 90.6 fL (ref 80.0–100.0)
Monocytes Absolute: 0.5 10*3/uL (ref 0.1–1.0)
Monocytes Relative: 9 %
Neutro Abs: 3 10*3/uL (ref 1.7–7.7)
Neutrophils Relative %: 59 %
Platelet Count: 118 10*3/uL — ABNORMAL LOW (ref 150–400)
RBC: 4.7 MIL/uL (ref 3.87–5.11)
RDW: 12.3 % (ref 11.5–15.5)
WBC Count: 5.1 10*3/uL (ref 4.0–10.5)
nRBC: 0 % (ref 0.0–0.2)

## 2023-05-25 LAB — CMP (CANCER CENTER ONLY)
ALT: 14 U/L (ref 0–44)
AST: 16 U/L (ref 15–41)
Albumin: 4.3 g/dL (ref 3.5–5.0)
Alkaline Phosphatase: 48 U/L (ref 38–126)
Anion gap: 4 — ABNORMAL LOW (ref 5–15)
BUN: 12 mg/dL (ref 6–20)
CO2: 29 mmol/L (ref 22–32)
Calcium: 9.3 mg/dL (ref 8.9–10.3)
Chloride: 105 mmol/L (ref 98–111)
Creatinine: 0.63 mg/dL (ref 0.44–1.00)
GFR, Estimated: >60 mL/min (ref >60)
Glucose, Bld: 101 mg/dL — ABNORMAL HIGH (ref 70–99)
Potassium: 4.2 mmol/L (ref 3.5–5.1)
Sodium: 138 mmol/L (ref 135–145)
Total Bilirubin: 0.6 mg/dL (ref 0.3–1.2)
Total Protein: 6.9 g/dL (ref 6.5–8.1)

## 2023-05-25 LAB — CEA (IN HOUSE-CHCC): CEA (CHCC-In House): 3.77 ng/mL (ref 0.00–5.00)

## 2023-05-25 MED ORDER — IOHEXOL 300 MG/ML  SOLN
100.0000 mL | Freq: Once | INTRAMUSCULAR | Status: AC | PRN
  Administered 2023-05-25: 100 mL via INTRAVENOUS

## 2023-05-25 MED ORDER — SODIUM CHLORIDE 0.9% FLUSH
10.0000 mL | Freq: Once | INTRAVENOUS | Status: AC
  Administered 2023-05-25: 10 mL

## 2023-05-25 MED ORDER — IOHEXOL 9 MG/ML PO SOLN
1000.0000 mL | Freq: Once | ORAL | Status: AC
  Administered 2023-05-25: 1000 mL via ORAL

## 2023-05-25 MED ORDER — SODIUM CHLORIDE (PF) 0.9 % IJ SOLN
INTRAMUSCULAR | Status: AC
  Filled 2023-05-25: qty 50

## 2023-05-25 MED ORDER — HEPARIN SOD (PORK) LOCK FLUSH 100 UNIT/ML IV SOLN
500.0000 [IU] | Freq: Once | INTRAVENOUS | Status: AC
  Administered 2023-05-25: 500 [IU]

## 2023-05-28 ENCOUNTER — Ambulatory Visit: Payer: BC Managed Care – PPO | Admitting: Hematology

## 2023-05-29 ENCOUNTER — Other Ambulatory Visit: Payer: Self-pay

## 2023-06-03 ENCOUNTER — Inpatient Hospital Stay: Payer: Medicare Other | Admitting: Hematology

## 2023-06-03 ENCOUNTER — Telehealth: Payer: Self-pay | Admitting: Hematology

## 2023-06-05 NOTE — Progress Notes (Deleted)
[facility]   Telephone:(336) [phone] Fax:(336) [phone]   Clinic Follow up Note   Patient Care Team: [facility] as PCP - General Malachy Mood, MD as Consulting Physician (Oncology) Almond Lint, MD as Consulting Physician (General Surgery) Pollyann Samples, NP as Nurse Practitioner (Nurse Practitioner) Warden Fillers, MD as Consulting Physician (Obstetrics and Gynecology) Lyn Henri, MD as Consulting Physician (Obstetrics and Gynecology)  Date of Service:  06/05/2023  CHIEF COMPLAINT: f/u of metastatic colon cancer   CURRENT THERAPY:  Surveillance  ASSESSMENT: *** LALLIE LATOURETTE is a 42 y.o. female with   No problem-specific Assessment & Plan notes found for this encounter.  ***   PLAN:   SUMMARY OF ONCOLOGIC HISTORY: Oncology History Overview Note  Cancer Staging Malignant neoplasm of rectosigmoid junction [facility]) Staging form: Colon and Rectum, AJCC 8th Edition - Pathologic stage from 04/04/2020: pT4a, pN1b, cM1 - Signed by Pollyann Samples, NP on 05/07/2020    Malignant neoplasm of rectosigmoid junction (HCC)  11/10/2019 Imaging   MRI Abdomen  IMPRESSION: 1. Redemonstrated hypoenhancing lesion of the posterior liver dome, hepatic segment VII, reduced in size compared to prior examination, measuring 1.3 x 1.3 cm, previously 1.8 x 1.7 cm. Findings are consistent with treatment response of a biopsy proven metastasis. No other evidence of lymphadenopathy or metastatic disease within the abdomen or pelvis. 2. Unchanged mild splenomegaly, maximum coronal span 14.0 cm. 3. 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   03/12/2020 Initial Diagnosis   Malignant neoplasm of rectosigmoid junction (HCC)   03/12/2020 Imaging   CT AP with contrast IMPRESSION: 1. Overall findings are highly concerning for colorectal carcinoma involving the sigmoid colon with an associated perforation and adjacent abscess  and phlegmon formation as detailed above. Currently, no collection is amenable to percutaneous drainage given their small size and location. 2. New 2 cm mass in the right hepatic lobe concerning for metastatic disease to the liver until proven otherwise. 3. Enlarged regional lymph nodes as detailed above is concerning for nodal metastatic disease. 4. Large stool burden. 5. Prominent pelvic veins which can be seen in patients with pelvic congestion syndrome.   03/13/2020 Imaging   ABD US IMPRESSION: Approximately 2.1 x 2.4 x 2.0 cm lobular homogeneously echogenic lesion in the right hepatic dome corresponds with the abnormality seen on the prior CT scan. Sonographically, this appearance is highly suggestive of a benign hemangioma.   Recommend MRI of the abdomen with gadolinium contrast which may provide a noninvasive diagnosis of benign hemangioma.    03/13/2020 Imaging   MR ABD W/WO CONTRAST Hepatobiliary: Diffuse low signal intensity throughout the hepatic parenchyma on T2 weighted images, presumably a consequence of recent Feraheme injection. In segment 7 of the liver (axial image 8 of series 5) there is a 2.5 x 1.9 cm well-defined lesion which is slightly T2 hyperintense. This lesion appears hyperintense on pre gadolinium T1 weighted images (likely a consequence of Feraheme). Interpretation of enhancement within the lesion is compromised by presence of Feraheme. No other hepatic lesions are confidently identified on today's examination. No intra or extrahepatic biliary ductal dilatation. Gallbladder is normal in appearance.   03/31/2020 Imaging   CT AP W contrast IMPRESSION: 1. Previously noted sigmoid colon mass appears increased in size, and again appears to be associated with a focal contained perforation which crosses the midline and has fistulized into the left adnexal region where there is now what appears to be a large left tubo-ovarian  abscess, as detailed above. This is  also associated with multiple enlarged lymph nodes in the pelvis measuring up to 1.2 cm in short axis and borderline enlarged retroperitoneal lymph nodes, concerning for metastatic disease. In addition, previously suspected metastatic lesion in segment 7 of the liver has enlarged. 2. Small volume of ascites. 3. Additional incidental findings, as above.   04/04/2020 Cancer Staging   Staging form: Colon and Rectum, AJCC 8th Edition - Pathologic stage from 04/04/2020: pT4a, pN1b, cM1 - Signed by Pollyann Samples, NP on 05/07/2020   04/04/2020 Procedure   Paracentesis, path showed no malignant cells (mixed acute and chronic inflammation present)   04/04/2020 Surgery   Open sigmoid colectomy and end colostomy by Dr. Almond Lint   04/04/2020 Pathology Results   FINAL MICROSCOPIC DIAGNOSIS: A. COLON, RECTOSIGMOID, RESECTION: - Invasive moderately differentiated adenocarcinoma, 6 cm, involving rectosigmoid junction - Carcinoma invades into serosal surface with perforation and associated serositis - Radial resection margin is positive for carcinoma; proximal and distal margins are not involved - Lymphovascular invasion is present - Metastatic carcinoma to one of fifteen lymph nodes (1/15); one tumor deposit - See oncology table B. LYMPH NODES, MESENTERIC, RESECTION: - Metastatic adenocarcinoma to one of six lymph nodes (1/6) - One tumor deposit  Addendum to note 2 involved lymph nodes (of 21 examined nodes) pT4a,pN1b MMR-normal, preserved expression of MLH1, MSH2, MSH6, PMS2   04/12/2020 Procedure   PAC placement    04/13/2020 Imaging   CT chest without contrast IMPRESSION: Interval development of bilateral pleural effusions, left slightly greater than right, with resultant bibasilar atelectasis including subtotal collapse of the left lower lobe. No evidence of intrathoracic metastatic disease, though evaluation of the collapsed parenchyma is limited. Hepatic metastasis again demonstrated.      04/16/2020 Pathology Results   FINAL MICROSCOPIC DIAGNOSIS:  A. LIVER, RIGHT LOBE, BIOPSY:  - Adenocarcinoma.  COMMENT:  The morphology is compatible with the provided clinical history of colorectal carcinoma.    05/23/2020 - 10/24/2020 Chemotherapy   FOLFIRINOX q2weeks for 3-6 months starting 05/23/20. Bevacizumab-bvzr Omer Jack) added with C2. Oxaliplatin held with C11-12 due to reaction. (pt developed SOB, chest palpitation and abdominal discomfort shortly after oxaliplatin started). Completed on 10/24/20.   08/05/2020 Imaging   CT AP  IMPRESSION: 1. Postsurgical changes of distal colectomy with a left lower quadrant end ostomy. No evidence of obstruction or acute complication at this time. Excluded rectal pouch in the deep pelvis without acute complication or worrisome features. 2. Slight interval decrease in size of a hypoattenuating lesion posterior right lobe liver measuring 1.7 x 1.8 x 2 cm. This lesion has previously undergone ultrasound-guided biopsy with pathologic results demonstrating adenocarcinoma compatible with metastatic disease from patient's resected colorectal carcinoma. 3. Slight prominence of the parametrial vessels bilaterally, nonspecific though can be seen in the setting of pelvic congestion syndrome. 4. Mild splenomegaly.  No focal lesion.     11/12/2020 Imaging   CT Chest  IMPRESSION: 1. No evidence of metastatic disease in the chest. 2. 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   12/13/2020 Surgery   A. LIVER, RIGHT, PARTIAL HEPATECTOMY WITH GALLBLADDER:  - Metastatic colon carcinoma to the liver showing approximately 80%  necrosis  - Resection margin is 0.8 cm from carcinoma  - Uninvolved liver parenchyma with no specific histopathologic changes  - Gallbladder with no specific histopathologic changes    01/28/2021 Genetic Testing   Negative genetic testing:  No pathogenic variants detected on the  [employer]  CustomNext-Cancer + RNAinsight panel. The report date is 01/28/2021.   The CustomNext-Cancer+RNAinsight panel offered by [facility] included sequencing and rearrangement analysis for the following 47 genes:  APC, ATM, AXIN2, BARD1, BMPR1A, BRCA1, BRCA2, BRIP1, CDH1, CDK4, CDKN2A, CHEK2, DICER1, EPCAM, GREM1, HOXB13, MEN1, MLH1, MSH2, MSH3, MSH6, MUTYH, NBN, NF1, NF2, NTHL1, PALB2, PMS2, POLD1, POLE, PTEN, RAD51C, RAD51D, RECQL, RET, SDHA, SDHAF2, SDHB, SDHC, SDHD, SMAD4, SMARCA4, STK11, TP53, TSC1, TSC2, and VHL.  RNA data is routinely analyzed for use in variant interpretation for all genes.   02/19/2021 Imaging   CT A/P w/o contrast  IMPRESSION: Slightly limited examination examination in absence of contrast administration. Status post partial right hepatectomy. Interval decrease in size in perihepatic fluid collection and resolution of right subdiaphragmatic fluid and gas. No new intra-abdominal fluid collections are identified.   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 Moderate stool throughout the colon without evidence of obstruction.   Fluid distension of the proximal duodenum to the level of the SMA hiatus which appears narrow. 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   05/05/2021 Imaging   CT AP  IMPRESSION: Postsurgical changes as described stable in appearance from the prior exam.   Changes suggestive of mild pelvic varices.   08/22/2021 Imaging   EXAM: CT ABDOMEN AND PELVIS WITH CONTRAST  IMPRESSION: 1. Extensive bowel content is identified throughout the colon consistent with constipation. 2. 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 3. Stable postoperative changes in the right lobe liver.   09/12/2021 Survivorship   SCP delivered by Santiago Glad, NP   10/05/2021  Imaging   EXAM: CT ABDOMEN AND PELVIS WITH CONTRAST  IMPRESSION: 1. Right ovarian/adnexal cyst, 5.2 cm. Because this lesion is not adequately characterized, prompt [place] is recommended for further evaluation. Note: This recommendation does not apply to premenarchal patients and to those with increased risk (genetic, family history, elevated tumor markers or other high-risk factors) of ovarian cancer. Reference: JACR 2020 Feb; 17(2):248-254 2. No other evidence of an acute abnormality within the abdomen or pelvis. 3. Moderate increase in the colonic and rectal stool burden. No bowel obstruction or inflammation. 4. No evidence of locally recurrent or metastatic colon carcinoma.   11/12/2021 Imaging   EXAM: CT CHEST WITHOUT CONTRAST  IMPRESSION: 1. No evidence of pulmonary metastatic disease. 2. Stable postoperative findings about the posterior right lobe of the liver.   03/28/2022 - 04/18/2022 Chemotherapy   Patient is on Treatment Plan : COLORECTAL Xelox (Capeox) q21d     05/23/2022 - 05/23/2022 Chemotherapy   Patient is on Treatment Plan : COLORECTAL CAPEOX (130/850) q21d x 8 cycles     05/25/2023 Imaging    IMPRESSION: 1. Unchanged postoperative appearance of the posterior right lobe of the liver status post resection involving hepatic segments VI and VII. No evidence of local recurrence or new metastatic disease in the chest, abdomen, or pelvis. 2. Status post sigmoid colon resection and reanastomosis. 3. Status post interval hysterectomy.        INTERVAL HISTORY: *** GENEROSE REDNER is here for a follow up of metastatic colon cancer. She was last seen by  me on 02/25/2023. She presents to the clinic      All other systems were reviewed with the patient and are negative.  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 Malachy Mood, oncology @ [facility].   Colon polyps    Colostomy present (HCC)    03-2020, reversed 2022   Family history of bladder cancer    Headache    History of chemotherapy    ended 09-2020, again in 2023, see 01/19/23 OV note in Epic by Dr. Malachy Mood.   Lactose intolerance 03/12/2020   Neuromuscular disorder (HCC)    neuropathy feet hands legs   Ovarian cyst 01/16/2023   3.2 cm left ovarian cyst , see 01/16/23 Pelvic US in Epic   UTI (lower urinary tract infection)     SURGICAL HISTORY: Past Surgical History:  Procedure Laterality Date   CESAREAN SECTION  2010   CHOLECYSTECTOMY  12/13/2020   COLECTOMY  03/2020   COLONOSCOPY     11/20/22 &03/18/21   COLOSTOMY TAKEDOWN N/A 05/30/2021   Procedure: LAPAROSCOPIC ASSISTED COLOSTOMY TAKEDOWN;  Surgeon: Almond Lint, MD;  Location: [facility] OR;  Service: General;  Laterality: N/A;   CYSTOSCOPY WITH STENT PLACEMENT  04/04/2020   Procedure: CYSTOSCOPY WITH STENT PLACEMENT;  Surgeon: Almond Lint, MD;  Location: [facility];  Service: General;;   LAPAROSCOPIC LIVER ULTRASOUND N/A 12/13/2020   Procedure: INTRAOPERATIVE LIVER ULTRASOUND;  Surgeon: Almond Lint, MD;  Location: [facility] OR;  Service: General;  Laterality: N/A;   LAPAROSCOPY N/A 12/13/2020   Procedure: LAPAROSCOPY DIAGNOSTIC;  Surgeon: Almond Lint, MD;  Location: [facility] OR;  Service: General;  Laterality: N/A;   LAPAROTOMY N/A 04/04/2020   Procedure: EXPLORATORY LAPAROTOMY;  Surgeon: Almond Lint, MD;  Location: [facility];  Service: General;  Laterality: N/A;   OPEN PARTIAL HEPATECTOMY  N/A 12/13/2020   Procedure: OPEN PARTIAL HEPATECTOMY;  Surgeon: Almond Lint, MD;  Location: [facility] OR;   Service: General;  Laterality: N/A;  ROOM 2 STARTING AT 09:30AM FOR 300 MIN   PORTACATH PLACEMENT Right 04/12/2020   Procedure: INSERTION PORT-A-CATH WITH ULTRASOUND;  Surgeon: Kinsinger, De Blanch, MD;  Location: [facility];  Service: General;  Laterality: Right;   ROBOTIC ASSISTED TOTAL HYSTERECTOMY WITH BILATERAL SALPINGO OOPHERECTOMY Left 01/29/2023   Procedure: XI ROBOTIC ASSISTED TOTAL HYSTERECTOMY WITH LEFT SALPINGO OOPHORECTOMY; LYSIS OF ADHESIONS;  Surgeon: Carver Fila, MD;  Location: [facility];  Service: Gynecology;  Laterality: Left;    I have reviewed the social history and family history with the patient and they are unchanged from previous note.  ALLERGIES:  is allergic to oxaliplatin.  MEDICATIONS:  Current Outpatient Medications  Medication Sig Dispense Refill   acetaminophen (TYLENOL) 325 MG tablet Take 2 tablets (650 mg total) by mouth every 6 (six) hours as needed for mild pain, moderate pain, fever or headache (fever > 101). (Patient taking differently: Take 650 mg by mouth every 6 (six) hours as needed for mild pain, moderate pain, fever or headache.)     buPROPion ER (WELLBUTRIN SR) 100 MG 12 hr tablet Take 100 mg by mouth daily.     clonazePAM (KLONOPIN) 1 MG tablet Take 1 mg by mouth 2 (two) times daily as needed for anxiety.  estradiol (VIVELLE-DOT) 0.1 MG/24HR patch Place 1 patch (0.1 mg total) onto the skin 2 (two) times a week. 8 patch 12   ibuprofen (ADVIL) 600 MG tablet Take 1 tablet (600 mg total) by mouth every 6 (six) hours as needed. Please make sure to drink plenty of water 20 tablet 1   Multiple Vitamin (MULTIVITAMIN WITH MINERALS) TABS tablet Take 1 tablet by mouth daily.     nitrofurantoin, macrocrystal-monohydrate, (MACROBID) 100 MG capsule Take 100 mg by mouth daily as needed (sexual activity).     ondansetron (ZOFRAN) 8 MG tablet TAKE 1 TABLET(8 MG) BY MOUTH EVERY 8 HOURS AS NEEDED FOR NAUSEA OR VOMITING (Patient taking differently: Take 8 mg by mouth  as needed for nausea or vomiting.) 30 tablet 2   Probiotic Product (PROBIOTIC PO) Take 2 capsules by mouth daily.     prochlorperazine (COMPAZINE) 10 MG tablet Take 1 tablet (10 mg total) by mouth every 6 (six) hours as needed (Nausea or vomiting). 30 tablet 1   traMADol (ULTRAM) 50 MG tablet Take 1 tablet (50 mg total) by mouth every 12 (twelve) hours as needed. 20 tablet 0   No current facility-administered medications for this visit.    PHYSICAL EXAMINATION: ECOG PERFORMANCE STATUS: {CHL ONC ECOG PS:[phone]}  There were no vitals filed for this visit. Wt Readings from Last 3 Encounters:  02/19/23 160 lb 9.6 oz (72.8 kg)  01/29/23 158 lb 0.7 oz (71.7 kg)  01/28/23 158 lb (71.7 kg)    {Only keep what was examined. If exam not performed, can use .CEXAM } GENERAL:alert, no distress and comfortable SKIN: skin color, texture, turgor are normal, no rashes or significant lesions EYES: normal, Conjunctiva are pink and non-injected, sclera clear {OROPHARYNX:no exudate, no erythema and lips, buccal mucosa, and tongue normal}  NECK: supple, thyroid normal size, non-tender, without nodularity LYMPH:  no palpable lymphadenopathy in the cervical, axillary {or inguinal} LUNGS: clear to auscultation and percussion with normal breathing effort HEART: regular rate & rhythm and no murmurs and no lower extremity edema ABDOMEN:abdomen soft, non-tender and normal bowel sounds Musculoskeletal:no cyanosis of digits and no clubbing  NEURO: alert & oriented x 3 with fluent speech, no focal motor/sensory deficits  LABORATORY DATA:  I have reviewed the data as listed    Latest Ref Rng & Units 05/25/2023   10:20 AM 01/28/2023    9:18 AM 01/19/2023   11:01 AM  CBC  WBC 4.0 - 10.5 K/uL 5.1  5.0  3.7   Hemoglobin 12.0 - 15.0 g/dL 82.9  56.2  13.0   Hematocrit 36.0 - 46.0 % 42.6  43.5  42.8   Platelets 150 - 400 K/uL 118  120  121         Latest Ref Rng & Units 05/25/2023   10:20 AM 01/28/2023    9:18  AM 01/19/2023   11:01 AM  CMP  Glucose 70 - 99 mg/dL 865  80  784   BUN 6 - 20 mg/dL 12  13  10    Creatinine 0.44 - 1.00 mg/dL 6.96  2.95  2.84   Sodium 135 - 145 mmol/L 138  137  139   Potassium 3.5 - 5.1 mmol/L 4.2  3.6  4.0   Chloride 98 - 111 mmol/L 105  103  107   CO2 22 - 32 mmol/L 29  26  28    Calcium 8.9 - 10.3 mg/dL 9.3  8.7  9.5   Total Protein 6.5 - 8.1 g/dL 6.9  6.5  7.1   Total Bilirubin 0.3 - 1.2 mg/dL 0.6  0.3  0.6   Alkaline Phos 38 - 126 U/L 48  46  43   AST 15 - 41 U/L 16  21  16    ALT 0 - 44 U/L 14  22  16        RADIOGRAPHIC STUDIES: I have personally reviewed the radiological images as listed and agreed with the findings in the report. No results found.    No orders of the defined types were placed in this encounter.  All questions were answered. The patient knows to call the clinic with any problems, questions or concerns. No barriers to learning was detected. The total time spent in the appointment was {CHL ONC TIME VISIT - BMWUX:[id number]}.     Salome Holmes, CMA 06/05/2023   I, Monica Martinez, CMA, am acting as scribe for Malachy Mood, MD.   {Add scribe attestation statement}

## 2023-06-07 NOTE — Assessment & Plan Note (Deleted)
grade 2, [id number] stage IV with oligo liver metastasis; MMR normal, KRAS (+), right ovarian metastasis in 01/2022  -diagnosed 03/2020 by urgent colectomy for abdominal abscess. Liver metastasis confirmed by biopsy 04/16/20. -Foundation One showed K-ras mutation, she is not a candidate for EGFR inhibitor -s/p FOLFOXIRI/bevacizumab  05/23/20 - 10/24/20. 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 -s/p liver resection on 12/13/20 under Dr. Donell Beers.  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 -CAPEOX started 03/28/22, oxali discontinued after 05/23/22 due to recurrent infusion reaction. -she completed Xeloda in 08/2022. -restaging CT CAP 07/30/22 showed: 3.4 cm lesion in left ovary, likely a cyst; otherwise, no evidence of new or progressive disease. -CEA has been WNL/stable since initially taken after colectomy. -further evaluation with pelvic US on 08/04/22 showed a 3 cm mass in left ovary, favored benign and likely a hemorrhagic cyst.  -PET scan on 08/13/22 showed a hypermetabolic focus in pelvis associated with right fundal region of uterus, without obvious CT abnormality, likely representing a benign fibroid. No findings for local recurrence or metastatic disease. -she underwent hysterectomy and left oophorectomy sooner than Jan 29, 2023. Her surgical pathology were all benign.  -CT scan 05/25/2023 showed NED, I personally reviewed with her  -continue cancer surveillance

## 2023-06-08 ENCOUNTER — Telehealth: Payer: Self-pay | Admitting: Hematology

## 2023-06-08 ENCOUNTER — Inpatient Hospital Stay: Payer: Medicare Other | Admitting: Hematology

## 2023-06-08 DIAGNOSIS — C19 Malignant neoplasm of rectosigmoid junction: Secondary | ICD-10-CM

## 2023-06-09 ENCOUNTER — Other Ambulatory Visit: Payer: Self-pay

## 2023-06-10 ENCOUNTER — Telehealth: Payer: Self-pay | Admitting: Hematology

## 2023-06-12 NOTE — Progress Notes (Unsigned)
[facility]   [phone] [phone]   Clinic Follow up Note   Patient Care Team: [facility], [facility] as PCP - General Malachy Mood, MD as Consulting Physician (Oncology) Almond Lint, MD as Consulting Physician (General Surgery) Pollyann Samples, NP as Nurse Practitioner (Nurse Practitioner) Warden Fillers, MD as Consulting Physician (Obstetrics and Gynecology) Lyn Henri, MD as Consulting Physician (Obstetrics and Gynecology)  Date of Service:  06/12/2023  CHIEF COMPLAINT: f/u of metastatic colon cancer     CURRENT THERAPY:  -Xeloda, q21d, starting 03/28/22, currently 1500mg  am and 1000mg  pm day 1-14 from 05/19/22     ASSESSMENT: *** Stephanie [name] is a 42 y.o. female with   No problem-specific Assessment & Plan notes found for this encounter.  ***   PLAN: {Everything Dr. Mosetta Putt talks to pt about, including reviewing scans and labs. } -{proceed with ***} -{lab with/without flush and f/u when?}   SUMMARY OF ONCOLOGIC HISTORY: Oncology History Overview Note  Cancer Staging Malignant neoplasm of rectosigmoid junction [facility]) Staging form: Colon and Rectum, AJCC 8th Edition - Pathologic stage from 04/04/2020: pT4a, pN1b, cM1 - Signed by Pollyann Samples, NP on 05/07/2020    Malignant neoplasm of rectosigmoid junction (HCC)  11/10/2019 Imaging   MRI Abdomen  IMPRESSION: 1. Redemonstrated hypoenhancing lesion of the posterior liver dome, hepatic segment VII, reduced in size compared to prior examination, measuring 1.3 x 1.3 cm, previously 1.8 x 1.7 cm. Findings are consistent with treatment response of a biopsy proven metastasis. No other evidence of lymphadenopathy or metastatic disease within the abdomen or pelvis. 2. Unchanged mild splenomegaly, maximum coronal span 14.0 cm. 3. 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   03/12/2020 Initial Diagnosis   Malignant neoplasm of  rectosigmoid junction (HCC)   03/12/2020 Imaging   CT AP with contrast IMPRESSION: 1. Overall findings are highly concerning for colorectal carcinoma involving the sigmoid colon with an associated perforation and adjacent abscess and phlegmon formation as detailed above. Currently, no collection is amenable to percutaneous drainage given their small size and location. 2. New 2 cm mass in the right hepatic lobe concerning for metastatic disease to the liver until proven otherwise. 3. Enlarged regional lymph nodes as detailed above is concerning for nodal metastatic disease. 4. Large stool burden. 5. Prominent pelvic veins which can be seen in patients with pelvic congestion syndrome.   03/13/2020 Imaging   ABD US IMPRESSION: Approximately 2.1 x 2.4 x 2.0 cm lobular homogeneously echogenic lesion in the right hepatic dome corresponds with the abnormality seen on the prior CT scan. Sonographically, this appearance is highly suggestive of a benign hemangioma.   Recommend MRI of the abdomen with gadolinium contrast which may provide a noninvasive diagnosis of benign hemangioma.    03/13/2020 Imaging   MR ABD W/WO CONTRAST Hepatobiliary: Diffuse low signal intensity throughout the hepatic parenchyma on T2 weighted images, presumably a consequence of recent Feraheme injection. In segment 7 of the liver (axial image 8 of series 5) there is a 2.5 x 1.9 cm well-defined lesion which is slightly T2 hyperintense. This lesion appears hyperintense on pre gadolinium T1 weighted images (likely a consequence of Feraheme). Interpretation of enhancement within the lesion is compromised by presence of Feraheme. No other hepatic lesions are confidently identified on today's examination. No intra or extrahepatic biliary ductal dilatation. Gallbladder is normal in appearance.   03/31/2020 Imaging   CT AP W contrast IMPRESSION: 1. Previously noted sigmoid colon  mass appears increased in size, and again  appears to be associated with a focal contained perforation which crosses the midline and has fistulized into the left adnexal region where there is now what appears to be a large left tubo-ovarian abscess, as detailed above. This is also associated with multiple enlarged lymph nodes in the pelvis measuring up to 1.2 cm in short axis and borderline enlarged retroperitoneal lymph nodes, concerning for metastatic disease. In addition, previously suspected metastatic lesion in segment 7 of the liver has enlarged. 2. Small volume of ascites. 3. Additional incidental findings, as above.   04/04/2020 Cancer Staging   Staging form: Colon and Rectum, AJCC 8th Edition - Pathologic stage from 04/04/2020: pT4a, pN1b, cM1 - Signed by Pollyann Samples, NP on 05/07/2020   04/04/2020 Procedure   Paracentesis, path showed no malignant cells (mixed acute and chronic inflammation present)   04/04/2020 Surgery   Open sigmoid colectomy and end colostomy by Dr. Almond Lint   04/04/2020 Pathology Results   FINAL MICROSCOPIC DIAGNOSIS: A. COLON, RECTOSIGMOID, RESECTION: - Invasive moderately differentiated adenocarcinoma, 6 cm, involving rectosigmoid junction - Carcinoma invades into serosal surface with perforation and associated serositis - Radial resection margin is positive for carcinoma; proximal and distal margins are not involved - Lymphovascular invasion is present - Metastatic carcinoma to one of fifteen lymph nodes (1/15); one tumor deposit - See oncology table B. LYMPH NODES, MESENTERIC, RESECTION: - Metastatic adenocarcinoma to one of six lymph nodes (1/6) - One tumor deposit  Addendum to note 2 involved lymph nodes (of 21 examined nodes) pT4a,pN1b MMR-normal, preserved expression of MLH1, MSH2, MSH6, PMS2   04/12/2020 Procedure   PAC placement    04/13/2020 Imaging   CT chest without contrast IMPRESSION: Interval development of bilateral pleural effusions, left slightly greater than right, with  resultant bibasilar atelectasis including subtotal collapse of the left lower lobe. No evidence of intrathoracic metastatic disease, though evaluation of the collapsed parenchyma is limited. Hepatic metastasis again demonstrated.     04/16/2020 Pathology Results   FINAL MICROSCOPIC DIAGNOSIS:  A. LIVER, RIGHT LOBE, BIOPSY:  - Adenocarcinoma.  COMMENT:  The morphology is compatible with the provided clinical history of colorectal carcinoma.    05/23/2020 - 10/24/2020 Chemotherapy   FOLFIRINOX q2weeks for 3-6 months starting 05/23/20. Bevacizumab-bvzr Omer Jack) added with C2. Oxaliplatin held with C11-12 due to reaction. (pt developed SOB, chest palpitation and abdominal discomfort shortly after oxaliplatin started). Completed on 10/24/20.   08/05/2020 Imaging   CT AP  IMPRESSION: 1. Postsurgical changes of distal colectomy with a left lower quadrant end ostomy. No evidence of obstruction or acute complication at this time. Excluded rectal pouch in the deep pelvis without acute complication or worrisome features. 2. Slight interval decrease in size of a hypoattenuating lesion posterior right lobe liver measuring 1.7 x 1.8 x 2 cm. This lesion has previously undergone ultrasound-guided biopsy with pathologic results demonstrating adenocarcinoma compatible with metastatic disease from patient's resected colorectal carcinoma. 3. Slight prominence of the parametrial vessels bilaterally, nonspecific though can be seen in the setting of pelvic congestion syndrome. 4. Mild splenomegaly.  No focal lesion.     11/12/2020 Imaging   CT Chest  IMPRESSION: 1. No evidence of metastatic disease in the chest. 2. 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   12/13/2020 Surgery   A. LIVER, RIGHT, PARTIAL HEPATECTOMY WITH GALLBLADDER:  - Metastatic colon carcinoma to the liver showing approximately 80%  necrosis  - Resection margin is  0.8 cm from carcinoma  -  Uninvolved liver parenchyma with no specific histopathologic changes  - Gallbladder with no specific histopathologic changes    01/28/2021 Genetic Testing   Negative genetic testing:  No pathogenic variants detected on the [employer] CustomNext-Cancer + RNAinsight panel. The report date is 01/28/2021.   The CustomNext-Cancer+RNAinsight panel offered by [facility] included sequencing and rearrangement analysis for the following 47 genes:  APC, ATM, AXIN2, BARD1, BMPR1A, BRCA1, BRCA2, BRIP1, CDH1, CDK4, CDKN2A, CHEK2, DICER1, EPCAM, GREM1, HOXB13, MEN1, MLH1, MSH2, MSH3, MSH6, MUTYH, NBN, NF1, NF2, NTHL1, PALB2, PMS2, POLD1, POLE, PTEN, RAD51C, RAD51D, RECQL, RET, SDHA, SDHAF2, SDHB, SDHC, SDHD, SMAD4, SMARCA4, STK11, TP53, TSC1, TSC2, and VHL.  RNA data is routinely analyzed for use in variant interpretation for all genes.   02/19/2021 Imaging   CT A/P w/o contrast  IMPRESSION: Slightly limited examination examination in absence of contrast administration. Status post partial right hepatectomy. Interval decrease in size in perihepatic fluid collection and resolution of right subdiaphragmatic fluid and gas. No new intra-abdominal fluid collections are identified.   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 Moderate stool throughout the colon without evidence of obstruction.   Fluid distension of the proximal duodenum to the level of the SMA hiatus which appears narrow. 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   05/05/2021 Imaging   CT AP  IMPRESSION: Postsurgical changes as described stable in appearance from the prior exam.   Changes suggestive of mild pelvic varices.   08/22/2021 Imaging   EXAM: CT ABDOMEN AND PELVIS WITH CONTRAST  IMPRESSION: 1. Extensive bowel content is identified throughout the colon consistent with constipation. 2.  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 3. Stable postoperative changes in the right lobe liver.   09/12/2021 Survivorship   SCP delivered by Santiago Glad, NP   10/05/2021 Imaging   EXAM: CT ABDOMEN AND PELVIS WITH CONTRAST  IMPRESSION: 1. Right ovarian/adnexal cyst, 5.2 cm. Because this lesion is not adequately characterized, prompt [place] is recommended for further evaluation. Note: This recommendation does not apply to premenarchal patients and to those with increased risk (genetic, family history, elevated tumor markers or other high-risk factors) of ovarian cancer. Reference: JACR 2020 Feb; 17(2):248-254 2. No other evidence of an acute abnormality within the abdomen or pelvis. 3. Moderate increase in the colonic and rectal stool burden. No bowel obstruction or inflammation. 4. No evidence of locally recurrent or metastatic colon carcinoma.   11/12/2021 Imaging   EXAM: CT CHEST WITHOUT CONTRAST  IMPRESSION: 1. No evidence of pulmonary metastatic disease. 2. Stable postoperative findings about the posterior right lobe of the liver.   03/28/2022 - 04/18/2022 Chemotherapy   Patient is on Treatment Plan : COLORECTAL Xelox (Capeox) q21d     05/23/2022 - 05/23/2022 Chemotherapy   Patient is on Treatment Plan : COLORECTAL CAPEOX (130/850) q21d x 8 cycles     05/25/2023 Imaging    IMPRESSION: 1. Unchanged postoperative appearance of the posterior right lobe of the liver status post resection involving hepatic segments VI and VII. No evidence of local recurrence or new metastatic disease in the chest, abdomen, or pelvis. 2. Status post sigmoid colon resection and reanastomosis. 3. Status post interval hysterectomy.        INTERVAL HISTORY: *** Stephanie [name] is here for a follow up of metastatic colon cancer . She was last seen by  me on 02/25/2023. She presents to the clinic     All other systems were reviewed with the  patient and are negative.  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 Malachy Mood, oncology @ [facility]   Colon polyps    Colostomy present (HCC)    03-2020, reversed 2022   Family history of bladder cancer    Headache    History of chemotherapy    ended 09-2020, again in 2023, see 01/19/23 OV note in Epic by Dr. Malachy Mood.   Lactose intolerance 03/12/2020   Neuromuscular disorder (HCC)    neuropathy feet hands legs   Ovarian cyst 01/16/2023   3.2 cm left ovarian cyst , see 01/16/23 Pelvic US in Epic   UTI (lower urinary tract infection)     SURGICAL HISTORY: Past Surgical History:  Procedure Laterality Date   CESAREAN SECTION  2010   CHOLECYSTECTOMY  12/13/2020   COLECTOMY  03/2020   COLONOSCOPY     11/20/22 &03/18/21   COLOSTOMY TAKEDOWN N/A 05/30/2021   Procedure: LAPAROSCOPIC ASSISTED COLOSTOMY TAKEDOWN;  Surgeon: Almond Lint, MD;  Location: [facility] OR;  Service: General;  Laterality: N/A;   CYSTOSCOPY WITH STENT PLACEMENT  04/04/2020   Procedure: CYSTOSCOPY WITH STENT PLACEMENT;  Surgeon: Almond Lint, MD;  Location: [facility] ORS;  Service: General;;   LAPAROSCOPIC LIVER ULTRASOUND N/A 12/13/2020   Procedure: INTRAOPERATIVE LIVER ULTRASOUND;  Surgeon: Almond Lint, MD;  Location: [facility] OR;  Service: General;  Laterality: N/A;   LAPAROSCOPY N/A 12/13/2020   Procedure: LAPAROSCOPY DIAGNOSTIC;  Surgeon: Almond Lint, MD;  Location: [facility] OR;  Service: General;  Laterality: N/A;   LAPAROTOMY N/A 04/04/2020   Procedure: EXPLORATORY  LAPAROTOMY;  Surgeon: Almond Lint, MD;  Location: [facility];  Service: General;  Laterality: N/A;   OPEN PARTIAL HEPATECTOMY  N/A 12/13/2020   Procedure: OPEN PARTIAL HEPATECTOMY;  Surgeon: Almond Lint, MD;  Location: [facility] OR;  Service: General;  Laterality: N/A;  ROOM 2 STARTING AT 09:30AM FOR 300 MIN   PORTACATH PLACEMENT Right 04/12/2020   Procedure: INSERTION PORT-A-CATH WITH ULTRASOUND;  Surgeon: Kinsinger, De Blanch, MD;  Location: [facility];  Service: General;  Laterality: Right;   ROBOTIC ASSISTED TOTAL HYSTERECTOMY WITH BILATERAL SALPINGO OOPHERECTOMY Left 01/29/2023   Procedure: XI ROBOTIC ASSISTED TOTAL HYSTERECTOMY WITH LEFT SALPINGO OOPHORECTOMY; LYSIS OF ADHESIONS;  Surgeon: Carver Fila, MD;  Location: [facility];  Service: Gynecology;  Laterality: Left;    I have reviewed the social history and family history with the patient and they are unchanged from previous note.  ALLERGIES:  is allergic to oxaliplatin.  MEDICATIONS:  Current Outpatient Medications  Medication Sig Dispense Refill   acetaminophen (TYLENOL) 325 MG tablet Take 2 tablets (650 mg total) by mouth every 6 (six) hours as needed for mild pain, moderate pain, fever or headache (fever > 101). (Patient taking differently: Take 650 mg by mouth every 6 (six) hours as needed for mild pain, moderate pain, fever or headache.)  buPROPion ER (WELLBUTRIN SR) 100 MG 12 hr tablet Take 100 mg by mouth daily.     clonazePAM (KLONOPIN) 1 MG tablet Take 1 mg by mouth 2 (two) times daily as needed for anxiety.     estradiol (VIVELLE-DOT) 0.1 MG/24HR patch Place 1 patch (0.1 mg total) onto the skin 2 (two) times a week. 8 patch 12   ibuprofen (ADVIL) 600 MG tablet Take 1 tablet (600 mg total) by mouth every 6 (six) hours as needed. Please make sure to drink plenty of water 20 tablet 1   Multiple Vitamin (MULTIVITAMIN WITH MINERALS) TABS tablet Take 1 tablet by mouth daily.     nitrofurantoin, macrocrystal-monohydrate, (MACROBID) 100  MG capsule Take 100 mg by mouth daily as needed (sexual activity).     ondansetron (ZOFRAN) 8 MG tablet TAKE 1 TABLET(8 MG) BY MOUTH EVERY 8 HOURS AS NEEDED FOR NAUSEA OR VOMITING (Patient taking differently: Take 8 mg by mouth as needed for nausea or vomiting.) 30 tablet 2   Probiotic Product (PROBIOTIC PO) Take 2 capsules by mouth daily.     prochlorperazine (COMPAZINE) 10 MG tablet Take 1 tablet (10 mg total) by mouth every 6 (six) hours as needed (Nausea or vomiting). 30 tablet 1   traMADol (ULTRAM) 50 MG tablet Take 1 tablet (50 mg total) by mouth every 12 (twelve) hours as needed. 20 tablet 0   No current facility-administered medications for this visit.    PHYSICAL EXAMINATION: ECOG PERFORMANCE STATUS: {CHL ONC ECOG [phone]}  There were no vitals filed for this visit. Wt Readings from Last 3 Encounters:  02/19/23 160 lb 9.6 oz (72.8 kg)  01/29/23 158 lb 0.7 oz (71.7 kg)  01/28/23 158 lb (71.7 kg)    {Only keep what was examined. If exam not performed, can use .CEXAM } GENERAL:alert, no distress and comfortable SKIN: skin color, texture, turgor are normal, no rashes or significant lesions EYES: normal, Conjunctiva are pink and non-injected, sclera clear {OROPHARYNX:no exudate, no erythema and lips, buccal mucosa, and tongue normal}  NECK: supple, thyroid normal size, non-tender, without nodularity LYMPH:  no palpable lymphadenopathy in the cervical, axillary {or inguinal} LUNGS: clear to auscultation and percussion with normal breathing effort HEART: regular rate & rhythm and no murmurs and no lower extremity edema ABDOMEN:abdomen soft, non-tender and normal bowel sounds Musculoskeletal:no cyanosis of digits and no clubbing  NEURO: alert & oriented x 3 with fluent speech, no focal motor/sensory deficits  LABORATORY DATA:  I have reviewed the data as listed    Latest Ref Rng & Units 05/25/2023   10:20 AM 01/28/2023    9:18 AM 01/19/2023   11:01 AM  CBC  WBC 4.0 - 10.5  K/uL 5.1  5.0  3.7   Hemoglobin 12.0 - 15.0 g/dL 81.1  91.4  78.2   Hematocrit 36.0 - 46.0 % 42.6  43.5  42.8   Platelets 150 - 400 K/uL 118  120  121         Latest Ref Rng & Units 05/25/2023   10:20 AM 01/28/2023    9:18 AM 01/19/2023   11:01 AM  CMP  Glucose 70 - 99 mg/dL 956  80  213   BUN 6 - 20 mg/dL 12  13  10    Creatinine 0.44 - 1.00 mg/dL 0.86  5.78  4.69   Sodium 135 - 145 mmol/L 138  137  139   Potassium 3.5 - 5.1 mmol/L 4.2  3.6  4.0   Chloride 98 - 111  mmol/L 105  103  107   CO2 22 - 32 mmol/L 29  26  28    Calcium 8.9 - 10.3 mg/dL 9.3  8.7  9.5   Total Protein 6.5 - 8.1 g/dL 6.9  6.5  7.1   Total Bilirubin 0.3 - 1.2 mg/dL 0.6  0.3  0.6   Alkaline Phos 38 - 126 U/L 48  46  43   AST 15 - 41 U/L 16  21  16    ALT 0 - 44 U/L 14  22  16        RADIOGRAPHIC STUDIES: I have personally reviewed the radiological images as listed and agreed with the findings in the report. No results found.    No orders of the defined types were placed in this encounter.  All questions were answered. The patient knows to call the clinic with any problems, questions or concerns. No barriers to learning was detected. The total time spent in the appointment was {CHL ONC TIME VISIT - KGMWN:[id number]}.     Salome Holmes, CMA 06/12/2023   I, Monica Martinez, CMA, am acting as scribe for Malachy Mood, MD.   {Add scribe attestation statement}

## 2023-06-14 NOTE — Assessment & Plan Note (Signed)
grade 2, [id number] stage IV with oligo liver metastasis; MMR normal, KRAS (+), right ovarian metastasis in 01/2022  -diagnosed 03/2020 by urgent colectomy for abdominal abscess. Liver metastasis confirmed by biopsy 04/16/20. -Foundation One showed K-ras mutation, she is not a candidate for EGFR inhibitor -s/p FOLFOXIRI/bevacizumab  05/23/20 - 10/24/20. 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 -s/p liver resection on 12/13/20 under Dr. Donell Beers.  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 -CAPEOX started 03/28/22, oxali discontinued after 05/23/22 due to recurrent infusion reaction. -she completed Xeloda in 08/2022. -restaging CT CAP 07/30/22 showed: 3.4 cm lesion in left ovary, likely a cyst; otherwise, no evidence of new or progressive disease. -CEA has been WNL/stable since initially taken after colectomy. -further evaluation with pelvic US on 08/04/22 showed a 3 cm mass in left ovary, favored benign and likely a hemorrhagic cyst.  -PET scan on 08/13/22 showed a hypermetabolic focus in pelvis associated with right fundal region of uterus, without obvious CT abnormality, likely representing a benign fibroid. No findings for local recurrence or metastatic disease. -she underwent hysterectomy and left oophorectomy sooner than Jan 29, 2023. Her surgical pathology were all benign.  -CT scan 05/25/2023 showed NED, I personally reviewed with her.  Labs including tumor marker CEA were also unremarkable.  No clinical concern for recurrence. -continue cancer surveillance

## 2023-06-15 ENCOUNTER — Encounter: Payer: Self-pay | Admitting: Hematology

## 2023-06-15 ENCOUNTER — Inpatient Hospital Stay: Payer: Medicare Other | Attending: Nurse Practitioner | Admitting: Hematology

## 2023-06-15 DIAGNOSIS — C19 Malignant neoplasm of rectosigmoid junction: Secondary | ICD-10-CM

## 2023-06-15 MED ORDER — TRAMADOL HCL 50 MG PO TABS: 50.0000 mg | ORAL_TABLET | Freq: Two times a day (BID) | ORAL | 0 refills | Status: AC | PRN

## 2023-06-16 ENCOUNTER — Telehealth: Payer: Self-pay | Admitting: Hematology

## 2023-06-30 ENCOUNTER — Telehealth: Payer: Self-pay

## 2023-06-30 NOTE — Telephone Encounter (Signed)
LVM for pt stating that Dr. Latanya Maudlin office received notification from the [facility] After-Hours Call Center that the pt called regarding pt's stomach pain.  Stated that Dr. Mosetta Putt want to know if the pt would like to be seen in clinic today or do a telephone visit with Dr. Mosetta Putt today if the pt cannot come into the office.  Instructed pt to give Dr. Latanya Maudlin office a call as soon as possible so she can be scheduled today.

## 2023-06-30 NOTE — Telephone Encounter (Signed)
Pt returned call and stated she went to the Emergency Room after speaking the after hours service.  Pt stated while in the ER they ran several test including a CT Scan.  Pt stated she was dx with having severe constipation which has now resolved.  Pt stated she does not need to see Dr. Mosetta Putt now.  Pt was very appreciative of Dr. Latanya Maudlin office contacting her regarding her previous call to the after hours service.  Pt had no further concerns.  Notified Dr. Mosetta Putt of the conversation with patient.

## 2023-07-13 ENCOUNTER — Encounter: Payer: Self-pay | Admitting: Hematology

## 2023-07-13 NOTE — Telephone Encounter (Signed)
error 

## 2023-07-27 ENCOUNTER — Inpatient Hospital Stay: Payer: Medicare Other | Attending: Nurse Practitioner

## 2023-08-06 ENCOUNTER — Other Ambulatory Visit: Payer: Self-pay

## 2023-08-06 DIAGNOSIS — C19 Malignant neoplasm of rectosigmoid junction: Secondary | ICD-10-CM

## 2023-08-10 ENCOUNTER — Inpatient Hospital Stay: Payer: Medicare Other | Attending: Nurse Practitioner

## 2023-08-10 DIAGNOSIS — Z452 Encounter for adjustment and management of vascular access device: Secondary | ICD-10-CM | POA: Insufficient documentation

## 2023-08-10 DIAGNOSIS — C19 Malignant neoplasm of rectosigmoid junction: Secondary | ICD-10-CM | POA: Diagnosis present

## 2023-08-10 DIAGNOSIS — Z95828 Presence of other vascular implants and grafts: Secondary | ICD-10-CM

## 2023-08-10 LAB — CMP (CANCER CENTER ONLY)
ALT: 14 U/L (ref 0–44)
AST: 16 U/L (ref 15–41)
Albumin: 4 g/dL (ref 3.5–5.0)
Alkaline Phosphatase: 61 U/L (ref 38–126)
Anion gap: 3 — ABNORMAL LOW (ref 5–15)
BUN: 13 mg/dL (ref 6–20)
CO2: 29 mmol/L (ref 22–32)
Calcium: 9.1 mg/dL (ref 8.9–10.3)
Chloride: 106 mmol/L (ref 98–111)
Creatinine: 0.65 mg/dL (ref 0.44–1.00)
GFR, Estimated: >60 mL/min (ref >60)
Glucose, Bld: 97 mg/dL (ref 70–99)
Potassium: 4.4 mmol/L (ref 3.5–5.1)
Sodium: 138 mmol/L (ref 135–145)
Total Bilirubin: 0.4 mg/dL (ref <1.2)
Total Protein: 6.5 g/dL (ref 6.5–8.1)

## 2023-08-10 LAB — CBC WITH DIFFERENTIAL (CANCER CENTER ONLY)
Abs Immature Granulocytes: 0.01 10*3/uL (ref 0.00–0.07)
Basophils Absolute: 0 10*3/uL (ref 0.0–0.1)
Basophils Relative: 1 %
Eosinophils Absolute: 0.1 10*3/uL (ref 0.0–0.5)
Eosinophils Relative: 1 %
HCT: 41.2 % (ref 36.0–46.0)
Hemoglobin: 13.8 g/dL (ref 12.0–15.0)
Immature Granulocytes: 0 %
Lymphocytes Relative: 20 %
Lymphs Abs: 1 10*3/uL (ref 0.7–4.0)
MCH: 31.2 pg (ref 26.0–34.0)
MCHC: 33.5 g/dL (ref 30.0–36.0)
MCV: 93 fL (ref 80.0–100.0)
Monocytes Absolute: 0.5 10*3/uL (ref 0.1–1.0)
Monocytes Relative: 9 %
Neutro Abs: 3.6 10*3/uL (ref 1.7–7.7)
Neutrophils Relative %: 69 %
Platelet Count: 122 10*3/uL — ABNORMAL LOW (ref 150–400)
RBC: 4.43 MIL/uL (ref 3.87–5.11)
RDW: 12.3 % (ref 11.5–15.5)
WBC Count: 5.2 10*3/uL (ref 4.0–10.5)
nRBC: 0 % (ref 0.0–0.2)

## 2023-08-10 MED ORDER — HEPARIN SOD (PORK) LOCK FLUSH 100 UNIT/ML IV SOLN
500.0000 [IU] | Freq: Once | INTRAVENOUS | Status: AC
  Administered 2023-08-10: 500 [IU]

## 2023-08-10 MED ORDER — SODIUM CHLORIDE 0.9% FLUSH
10.0000 mL | Freq: Once | INTRAVENOUS | Status: AC
  Administered 2023-08-10: 10 mL

## 2023-09-13 NOTE — Assessment & Plan Note (Deleted)
grade 2, [id number] stage IV with oligo liver metastasis; MMR normal, KRAS (+), right ovarian metastasis in 01/2022  -diagnosed 03/2020 by urgent colectomy for abdominal abscess. Liver metastasis confirmed by biopsy 04/16/20. -Foundation One showed K-ras mutation, she is not a candidate for EGFR inhibitor -s/p FOLFOXIRI/bevacizumab  05/23/20 - 10/24/20. 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 -s/p liver resection on 12/13/20 under Dr. Donell Beers.  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 -CAPEOX started 03/28/22, oxali discontinued after 05/23/22 due to recurrent infusion reaction. -she completed Xeloda in 08/2022. -restaging CT CAP 07/30/22 showed: 3.4 cm lesion in left ovary, likely a cyst; otherwise, no evidence of new or progressive disease. -CEA has been WNL/stable since initially taken after colectomy. -further evaluation with pelvic US on 08/04/22 showed a 3 cm mass in left ovary, favored benign and likely a hemorrhagic cyst.  -PET scan on 08/13/22 showed a hypermetabolic focus in pelvis associated with right fundal region of uterus, without obvious CT abnormality, likely representing a benign fibroid. No findings for local recurrence or metastatic disease. -she underwent hysterectomy and left oophorectomy sooner than Jan 29, 2023. Her surgical pathology were all benign.  -CT scan 05/25/2023 showed NED, I personally reviewed with her.  Labs including tumor marker CEA were also unremarkable.  No clinical concern for recurrence. -continue cancer surveillance

## 2023-09-15 ENCOUNTER — Inpatient Hospital Stay: Payer: Medicare Other | Admitting: Hematology

## 2023-09-15 ENCOUNTER — Inpatient Hospital Stay: Payer: Medicare Other | Attending: Nurse Practitioner

## 2023-09-15 ENCOUNTER — Telehealth: Payer: Self-pay

## 2023-09-15 DIAGNOSIS — C19 Malignant neoplasm of rectosigmoid junction: Secondary | ICD-10-CM

## 2023-09-15 NOTE — Telephone Encounter (Signed)
LVM stating pt had lab at 1015 and appt with Dr. Mosetta Putt today.  Instructed pt to contact the [facility] Scheduling Team to reschedule her appts.  Informed pt that the appts will be documented as a NO SHOW for today.

## 2023-09-29 ENCOUNTER — Telehealth: Payer: Self-pay | Admitting: Hematology

## 2023-10-11 NOTE — Assessment & Plan Note (Deleted)
 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 Liver metastasis confirmed by biopsy 04/16/20. -Foundation One showed K-ras mutation, she is not a candidate for EGFR inhibitor -s/p FOLFOXIRI/bevacizumab   05/23/20 - 10/24/20. 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 -s/p liver resection on 12/13/20 under Dr. Aron.  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 -CAPEOX started 03/28/22, oxali discontinued after 05/23/22 due to recurrent infusion reaction. -she completed Xeloda  in 08/2022. -restaging CT CAP 07/30/22 showed: 3.4 cm lesion in left ovary, likely a cyst; otherwise, no evidence of new or progressive disease. -CEA has been WNL/stable since initially taken after colectomy. -further evaluation with pelvic US  on 08/04/22 showed a 3 cm mass in left ovary, favored benign and likely a hemorrhagic cyst.  -PET scan on 08/13/22 showed a hypermetabolic focus in pelvis associated with right fundal region of uterus, without obvious CT abnormality, likely representing a benign fibroid. No findings for local recurrence or metastatic disease. -she underwent hysterectomy and left oophorectomy sooner than Jan 29, 2023. Her surgical pathology were all benign.  -CT scan 05/25/2023 showed NED, I personally reviewed with her.  Labs including tumor marker CEA were also unremarkable.  No clinical concern for recurrence. -continue cancer surveillance, will repeat CT in 12/2023. I also recommend ctDNA Signatera test every 6 months in between her CTs

## 2023-10-12 ENCOUNTER — Telehealth: Payer: Self-pay

## 2023-10-12 ENCOUNTER — Inpatient Hospital Stay: Payer: Medicare Other

## 2023-10-12 ENCOUNTER — Inpatient Hospital Stay: Payer: Medicare Other | Attending: Nurse Practitioner | Admitting: Hematology

## 2023-10-12 NOTE — Telephone Encounter (Signed)
 Called patient due to not showing up for her 940 appointment. She answered stating she didn't know that she had an appointment today. She stated she had the flu also stated she has moved and she is looking for a new oncologist closer to her. She stated she would call us  if she has any further needs. I no showed her for her appointment today.
